# Patient Record
Sex: Female | Born: 1945 | Race: White | Hispanic: No | Smoking: Current every day smoker
Health system: Southern US, Community
[De-identification: ages and names within clinical notes are randomized; demographics above are authoritative.]

## PROBLEM LIST (undated history)

## (undated) DIAGNOSIS — E785 Hyperlipidemia, unspecified: Secondary | ICD-10-CM

## (undated) DIAGNOSIS — I350 Nonrheumatic aortic (valve) stenosis: Secondary | ICD-10-CM

## (undated) DIAGNOSIS — I2510 Atherosclerotic heart disease of native coronary artery without angina pectoris: Secondary | ICD-10-CM

## (undated) DIAGNOSIS — E875 Hyperkalemia: Secondary | ICD-10-CM

## (undated) DIAGNOSIS — I25810 Atherosclerosis of coronary artery bypass graft(s) without angina pectoris: Secondary | ICD-10-CM

## (undated) DIAGNOSIS — Z9981 Dependence on supplemental oxygen: Secondary | ICD-10-CM

## (undated) DIAGNOSIS — K7200 Acute and subacute hepatic failure without coma: Secondary | ICD-10-CM

## (undated) DIAGNOSIS — J189 Pneumonia, unspecified organism: Secondary | ICD-10-CM

## (undated) DIAGNOSIS — F419 Anxiety disorder, unspecified: Secondary | ICD-10-CM

## (undated) DIAGNOSIS — I472 Ventricular tachycardia: Secondary | ICD-10-CM

## (undated) DIAGNOSIS — F32A Depression, unspecified: Secondary | ICD-10-CM

## (undated) DIAGNOSIS — J449 Chronic obstructive pulmonary disease, unspecified: Secondary | ICD-10-CM

## (undated) DIAGNOSIS — I10 Essential (primary) hypertension: Secondary | ICD-10-CM

## (undated) DIAGNOSIS — I272 Pulmonary hypertension, unspecified: Secondary | ICD-10-CM

## (undated) DIAGNOSIS — F329 Major depressive disorder, single episode, unspecified: Secondary | ICD-10-CM

## (undated) DIAGNOSIS — K219 Gastro-esophageal reflux disease without esophagitis: Secondary | ICD-10-CM

## (undated) DIAGNOSIS — C50412 Malignant neoplasm of upper-outer quadrant of left female breast: Secondary | ICD-10-CM

## (undated) DIAGNOSIS — S32010A Wedge compression fracture of first lumbar vertebra, initial encounter for closed fracture: Secondary | ICD-10-CM

## (undated) DIAGNOSIS — I509 Heart failure, unspecified: Secondary | ICD-10-CM

## (undated) DIAGNOSIS — J42 Unspecified chronic bronchitis: Secondary | ICD-10-CM

## (undated) DIAGNOSIS — F411 Generalized anxiety disorder: Secondary | ICD-10-CM

## (undated) DIAGNOSIS — J9610 Chronic respiratory failure, unspecified whether with hypoxia or hypercapnia: Secondary | ICD-10-CM

## (undated) DIAGNOSIS — R4182 Altered mental status, unspecified: Secondary | ICD-10-CM

## (undated) DIAGNOSIS — Z8719 Personal history of other diseases of the digestive system: Secondary | ICD-10-CM

## (undated) DIAGNOSIS — R945 Abnormal results of liver function studies: Secondary | ICD-10-CM

## (undated) DIAGNOSIS — C50911 Malignant neoplasm of unspecified site of right female breast: Secondary | ICD-10-CM

## (undated) DIAGNOSIS — I5030 Unspecified diastolic (congestive) heart failure: Secondary | ICD-10-CM

## (undated) DIAGNOSIS — R011 Cardiac murmur, unspecified: Secondary | ICD-10-CM

## (undated) DIAGNOSIS — E871 Hypo-osmolality and hyponatremia: Secondary | ICD-10-CM

## (undated) DIAGNOSIS — M1990 Unspecified osteoarthritis, unspecified site: Secondary | ICD-10-CM

## (undated) DIAGNOSIS — Z8489 Family history of other specified conditions: Secondary | ICD-10-CM

## (undated) DIAGNOSIS — I214 Non-ST elevation (NSTEMI) myocardial infarction: Secondary | ICD-10-CM

## (undated) DIAGNOSIS — Z9289 Personal history of other medical treatment: Secondary | ICD-10-CM

## (undated) DIAGNOSIS — J9600 Acute respiratory failure, unspecified whether with hypoxia or hypercapnia: Secondary | ICD-10-CM

## (undated) DIAGNOSIS — I209 Angina pectoris, unspecified: Secondary | ICD-10-CM

## (undated) DIAGNOSIS — I4729 Other ventricular tachycardia: Secondary | ICD-10-CM

## (undated) DIAGNOSIS — N179 Acute kidney failure, unspecified: Secondary | ICD-10-CM

## (undated) HISTORY — PX: CATARACT EXTRACTION W/ INTRAOCULAR LENS  IMPLANT, BILATERAL: SHX1307

## (undated) HISTORY — DX: Other ventricular tachycardia: I47.29

## (undated) HISTORY — DX: Abnormal results of liver function studies: R94.5

## (undated) HISTORY — DX: Malignant neoplasm of upper-outer quadrant of left female breast: C50.412

## (undated) HISTORY — DX: Wedge compression fracture of first lumbar vertebra, initial encounter for closed fracture: S32.010A

## (undated) HISTORY — PX: COLON SURGERY: SHX602

## (undated) HISTORY — DX: Personal history of other medical treatment: Z92.89

## (undated) HISTORY — DX: Atherosclerosis of coronary artery bypass graft(s) without angina pectoris: I25.810

## (undated) HISTORY — DX: Acute respiratory failure, unspecified whether with hypoxia or hypercapnia: J96.00

## (undated) HISTORY — DX: Hyperlipidemia, unspecified: E78.5

## (undated) HISTORY — DX: Altered mental status, unspecified: R41.82

## (undated) HISTORY — DX: Acute kidney failure, unspecified: N17.9

## (undated) HISTORY — DX: Personal history of other diseases of the digestive system: Z87.19

## (undated) HISTORY — DX: Atherosclerotic heart disease of native coronary artery without angina pectoris: I25.10

## (undated) HISTORY — DX: Acute and subacute hepatic failure without coma: K72.00

## (undated) HISTORY — DX: Nonrheumatic aortic (valve) stenosis: I35.0

## (undated) HISTORY — DX: Generalized anxiety disorder: F41.1

## (undated) HISTORY — DX: Hypo-osmolality and hyponatremia: E87.1

## (undated) HISTORY — DX: Essential (primary) hypertension: I10

## (undated) HISTORY — DX: Hyperkalemia: E87.5

## (undated) HISTORY — DX: Ventricular tachycardia: I47.2

## (undated) HISTORY — DX: Chronic respiratory failure, unspecified whether with hypoxia or hypercapnia: J96.10

---

## 2004-09-27 DIAGNOSIS — I214 Non-ST elevation (NSTEMI) myocardial infarction: Secondary | ICD-10-CM

## 2004-09-27 DIAGNOSIS — I2510 Atherosclerotic heart disease of native coronary artery without angina pectoris: Secondary | ICD-10-CM | POA: Diagnosis present

## 2004-09-27 HISTORY — DX: Non-ST elevation (NSTEMI) myocardial infarction: I21.4

## 2004-09-27 HISTORY — DX: Atherosclerotic heart disease of native coronary artery without angina pectoris: I25.10

## 2004-09-27 HISTORY — PX: CORONARY ANGIOPLASTY WITH STENT PLACEMENT: SHX49

## 2006-09-27 HISTORY — PX: BREAST BIOPSY: SHX20

## 2006-09-27 HISTORY — PX: BREAST LUMPECTOMY: SHX2

## 2007-09-28 HISTORY — PX: PARTIAL COLECTOMY: SHX5273

## 2007-11-26 HISTORY — PX: COLOSTOMY: SHX63

## 2008-05-15 LAB — HM COLONOSCOPY: HM Colonoscopy: NORMAL

## 2008-07-28 HISTORY — PX: COLOSTOMY CLOSURE: SHX1381

## 2009-09-27 HISTORY — PX: CHOLECYSTECTOMY OPEN: SUR202

## 2011-06-02 ENCOUNTER — Emergency Department (HOSPITAL_COMMUNITY): Payer: Medicare Other

## 2011-06-02 ENCOUNTER — Inpatient Hospital Stay (HOSPITAL_COMMUNITY)
Admission: EM | Admit: 2011-06-02 | Discharge: 2011-06-06 | DRG: 193 | Disposition: A | Discharge status: Home / Self Care | Payer: Medicare Other | Attending: Internal Medicine | Admitting: Internal Medicine

## 2011-06-02 DIAGNOSIS — I959 Hypotension, unspecified: Secondary | ICD-10-CM | POA: Diagnosis not present

## 2011-06-02 DIAGNOSIS — J4489 Other specified chronic obstructive pulmonary disease: Secondary | ICD-10-CM

## 2011-06-02 DIAGNOSIS — E785 Hyperlipidemia, unspecified: Secondary | ICD-10-CM | POA: Diagnosis present

## 2011-06-02 DIAGNOSIS — J9600 Acute respiratory failure, unspecified whether with hypoxia or hypercapnia: Secondary | ICD-10-CM

## 2011-06-02 DIAGNOSIS — G43909 Migraine, unspecified, not intractable, without status migrainosus: Secondary | ICD-10-CM | POA: Diagnosis present

## 2011-06-02 DIAGNOSIS — J158 Pneumonia due to other specified bacteria: Secondary | ICD-10-CM

## 2011-06-02 DIAGNOSIS — F17200 Nicotine dependence, unspecified, uncomplicated: Secondary | ICD-10-CM | POA: Diagnosis present

## 2011-06-02 DIAGNOSIS — I10 Essential (primary) hypertension: Secondary | ICD-10-CM | POA: Diagnosis present

## 2011-06-02 DIAGNOSIS — Z79899 Other long term (current) drug therapy: Secondary | ICD-10-CM

## 2011-06-02 DIAGNOSIS — Z7982 Long term (current) use of aspirin: Secondary | ICD-10-CM

## 2011-06-02 DIAGNOSIS — J449 Chronic obstructive pulmonary disease, unspecified: Secondary | ICD-10-CM

## 2011-06-02 DIAGNOSIS — J189 Pneumonia, unspecified organism: Principal | ICD-10-CM | POA: Diagnosis present

## 2011-06-02 DIAGNOSIS — C50919 Malignant neoplasm of unspecified site of unspecified female breast: Secondary | ICD-10-CM | POA: Diagnosis present

## 2011-06-02 DIAGNOSIS — J45901 Unspecified asthma with (acute) exacerbation: Secondary | ICD-10-CM | POA: Diagnosis present

## 2011-06-02 DIAGNOSIS — I359 Nonrheumatic aortic valve disorder, unspecified: Secondary | ICD-10-CM

## 2011-06-02 DIAGNOSIS — J441 Chronic obstructive pulmonary disease with (acute) exacerbation: Secondary | ICD-10-CM | POA: Diagnosis present

## 2011-06-02 LAB — BLOOD GAS, ARTERIAL
Acid-Base Excess: 4 mmol/L — ABNORMAL HIGH (ref 0.0–2.0)
Acid-Base Excess: 1.1 mmol/L (ref 0.0–2.0)
Bicarbonate: 31.7 mEq/L — ABNORMAL HIGH (ref 20.0–24.0)
Bicarbonate: 29 mEq/L — ABNORMAL HIGH (ref 20.0–24.0)
Drawn by: 336861
Drawn by: 103701
Expiratory PAP: 4
FIO2: 1 %
FIO2: 0.5 %
Inspiratory PAP: 8
Mode: POSITIVE
O2 Saturation: 96.6 %
O2 Saturation: 94.9 %
Patient temperature: 104
Patient temperature: 98.6
TCO2: 29 mmol/L (ref 0–100)
TCO2: 26.8 mmol/L (ref 0–100)
pCO2 arterial: 75.7 mmHg (ref 35.0–45.0)
pCO2 arterial: 64.9 mmHg (ref 35.0–45.0)
pH, Arterial: 7.265 — ABNORMAL LOW (ref 7.350–7.400)
pH, Arterial: 7.273 — ABNORMAL LOW (ref 7.350–7.400)
pO2, Arterial: 112 mmHg — ABNORMAL HIGH (ref 80.0–100.0)
pO2, Arterial: 83.9 mmHg (ref 80.0–100.0)

## 2011-06-02 LAB — EXPECTORATED SPUTUM ASSESSMENT W GRAM STAIN, RFLX TO RESP C

## 2011-06-02 LAB — CBC
HCT: 41.4 % (ref 36.0–46.0)
HCT: 41 % (ref 36.0–46.0)
Hemoglobin: 13.3 g/dL (ref 12.0–15.0)
Hemoglobin: 13 g/dL (ref 12.0–15.0)
MCH: 30.7 pg (ref 26.0–34.0)
MCH: 30.7 pg (ref 26.0–34.0)
MCHC: 32.1 g/dL (ref 30.0–36.0)
MCHC: 31.7 g/dL (ref 30.0–36.0)
MCV: 95.6 fL (ref 78.0–100.0)
MCV: 96.7 fL (ref 78.0–100.0)
Platelets: 346 10*3/uL (ref 150–400)
Platelets: 304 10*3/uL (ref 150–400)
RBC: 4.33 MIL/uL (ref 3.87–5.11)
RBC: 4.24 MIL/uL (ref 3.87–5.11)
RDW: 13.5 % (ref 11.5–15.5)
RDW: 13.7 % (ref 11.5–15.5)
WBC: 18.3 10*3/uL — ABNORMAL HIGH (ref 4.0–10.5)
WBC: 21.9 10*3/uL — ABNORMAL HIGH (ref 4.0–10.5)

## 2011-06-02 LAB — BASIC METABOLIC PANEL
BUN: 17 mg/dL (ref 6–23)
BUN: 17 mg/dL (ref 6–23)
CO2: 27 mEq/L (ref 19–32)
CO2: 29 mEq/L (ref 19–32)
Calcium: 9.9 mg/dL (ref 8.4–10.5)
Calcium: 8.3 mg/dL — ABNORMAL LOW (ref 8.4–10.5)
Chloride: 99 mEq/L (ref 96–112)
Chloride: 102 mEq/L (ref 96–112)
Creatinine, Ser: 0.91 mg/dL (ref 0.50–1.10)
Creatinine, Ser: 0.82 mg/dL (ref 0.50–1.10)
GFR calc Af Amer: >60 mL/min (ref >60)
GFR calc Af Amer: >60 mL/min (ref >60)
GFR calc non Af Amer: >60 mL/min (ref >60)
GFR calc non Af Amer: >60 mL/min (ref >60)
Glucose, Bld: 103 mg/dL — ABNORMAL HIGH (ref 70–99)
Glucose, Bld: 131 mg/dL — ABNORMAL HIGH (ref 70–99)
Potassium: 5.4 mEq/L — ABNORMAL HIGH (ref 3.5–5.1)
Potassium: 4.9 mEq/L (ref 3.5–5.1)
Sodium: 136 mEq/L (ref 135–145)
Sodium: 136 mEq/L (ref 135–145)

## 2011-06-02 LAB — CARDIAC PANEL(CRET KIN+CKTOT+MB+TROPI)
CK, MB: 3.4 ng/mL (ref 0.3–4.0)
CK, MB: 3.3 ng/mL (ref 0.3–4.0)
Relative Index: INVALID (ref 0.0–2.5)
Relative Index: INVALID (ref 0.0–2.5)
Total CK: 74 U/L (ref 7–177)
Total CK: 51 U/L (ref 7–177)
Troponin I: <0.3 ng/mL (ref <0.30)
Troponin I: <0.3 ng/mL (ref <0.30)

## 2011-06-02 LAB — DIFFERENTIAL
Basophils Absolute: 0.1 10*3/uL (ref 0.0–0.1)
Basophils Relative: 0 % (ref 0–1)
Eosinophils Absolute: 0 10*3/uL (ref 0.0–0.7)
Eosinophils Relative: 0 % (ref 0–5)
Lymphocytes Relative: 4 % — ABNORMAL LOW (ref 12–46)
Lymphs Abs: 0.8 10*3/uL (ref 0.7–4.0)
Monocytes Absolute: 1.1 10*3/uL — ABNORMAL HIGH (ref 0.1–1.0)
Monocytes Relative: 6 % (ref 3–12)
Neutro Abs: 16.4 10*3/uL — ABNORMAL HIGH (ref 1.7–7.7)
Neutrophils Relative %: 89 % — ABNORMAL HIGH (ref 43–77)

## 2011-06-02 LAB — POCT I-STAT TROPONIN I: Troponin i, poc: 0.02 ng/mL (ref 0.00–0.08)

## 2011-06-02 LAB — PROLACTIN: Prolactin: 4.6 ng/mL

## 2011-06-02 LAB — LACTIC ACID, PLASMA: Lactic Acid, Venous: 0.7 mmol/L (ref 0.5–2.2)

## 2011-06-02 LAB — MRSA PCR SCREENING: MRSA by PCR: NEGATIVE

## 2011-06-02 LAB — STREP PNEUMONIAE URINARY ANTIGEN: Strep Pneumo Urinary Antigen: NEGATIVE

## 2011-06-03 ENCOUNTER — Inpatient Hospital Stay (HOSPITAL_COMMUNITY): Payer: Medicare Other

## 2011-06-03 DIAGNOSIS — J9600 Acute respiratory failure, unspecified whether with hypoxia or hypercapnia: Secondary | ICD-10-CM

## 2011-06-03 DIAGNOSIS — J449 Chronic obstructive pulmonary disease, unspecified: Secondary | ICD-10-CM

## 2011-06-03 DIAGNOSIS — J158 Pneumonia due to other specified bacteria: Secondary | ICD-10-CM

## 2011-06-03 LAB — LEGIONELLA ANTIGEN, URINE: Legionella Antigen, Urine: NEGATIVE

## 2011-06-03 LAB — CARDIAC PANEL(CRET KIN+CKTOT+MB+TROPI)
CK, MB: 3.4 ng/mL (ref 0.3–4.0)
Relative Index: INVALID (ref 0.0–2.5)
Total CK: 50 U/L (ref 7–177)
Troponin I: <0.3 ng/mL (ref <0.30)

## 2011-06-04 ENCOUNTER — Inpatient Hospital Stay (HOSPITAL_COMMUNITY): Payer: Medicare Other

## 2011-06-04 LAB — CBC
HCT: 38.6 % (ref 36.0–46.0)
Hemoglobin: 12 g/dL (ref 12.0–15.0)
MCH: 30.4 pg (ref 26.0–34.0)
MCHC: 31.1 g/dL (ref 30.0–36.0)
MCV: 97.7 fL (ref 78.0–100.0)
Platelets: 260 10*3/uL (ref 150–400)
RBC: 3.95 MIL/uL (ref 3.87–5.11)
RDW: 13.6 % (ref 11.5–15.5)
WBC: 9.7 10*3/uL (ref 4.0–10.5)

## 2011-06-04 LAB — BASIC METABOLIC PANEL
BUN: 20 mg/dL (ref 6–23)
BUN: 18 mg/dL (ref 6–23)
CO2: 31 mEq/L (ref 19–32)
CO2: 32 mEq/L (ref 19–32)
Calcium: 9.3 mg/dL (ref 8.4–10.5)
Calcium: 9.4 mg/dL (ref 8.4–10.5)
Chloride: 102 mEq/L (ref 96–112)
Chloride: 99 mEq/L (ref 96–112)
Creatinine, Ser: 0.8 mg/dL (ref 0.50–1.10)
Creatinine, Ser: 0.75 mg/dL (ref 0.50–1.10)
GFR calc Af Amer: >60 mL/min (ref >60)
GFR calc Af Amer: >60 mL/min (ref >60)
GFR calc non Af Amer: >60 mL/min (ref >60)
GFR calc non Af Amer: >60 mL/min (ref >60)
Glucose, Bld: 118 mg/dL — ABNORMAL HIGH (ref 70–99)
Glucose, Bld: 107 mg/dL — ABNORMAL HIGH (ref 70–99)
Potassium: 5.5 mEq/L — ABNORMAL HIGH (ref 3.5–5.1)
Potassium: 5.4 mEq/L — ABNORMAL HIGH (ref 3.5–5.1)
Sodium: 137 mEq/L (ref 135–145)
Sodium: 137 mEq/L (ref 135–145)

## 2011-06-05 LAB — CULTURE, RESPIRATORY W GRAM STAIN: Culture: NORMAL

## 2011-06-06 LAB — BASIC METABOLIC PANEL
BUN: 12 mg/dL (ref 6–23)
CO2: 36 mEq/L — ABNORMAL HIGH (ref 19–32)
Calcium: 9.7 mg/dL (ref 8.4–10.5)
Chloride: 99 mEq/L (ref 96–112)
Creatinine, Ser: 0.59 mg/dL (ref 0.50–1.10)
GFR calc Af Amer: >60 mL/min (ref >60)
GFR calc non Af Amer: >60 mL/min (ref >60)
Glucose, Bld: 95 mg/dL (ref 70–99)
Potassium: 4.4 mEq/L (ref 3.5–5.1)
Sodium: 139 mEq/L (ref 135–145)

## 2011-06-08 LAB — CULTURE, BLOOD (ROUTINE X 2)
Culture  Setup Time: 201209050956
Culture  Setup Time: 201209050956
Culture: NO GROWTH
Culture: NO GROWTH

## 2011-06-11 ENCOUNTER — Other Ambulatory Visit: Payer: Self-pay | Admitting: Internal Medicine

## 2011-06-11 DIAGNOSIS — Z9889 Other specified postprocedural states: Secondary | ICD-10-CM

## 2011-06-11 DIAGNOSIS — Z853 Personal history of malignant neoplasm of breast: Secondary | ICD-10-CM

## 2011-06-18 ENCOUNTER — Ambulatory Visit
Admission: RE | Admit: 2011-06-18 | Discharge: 2011-06-18 | Disposition: A | Discharge status: Home / Self Care | Payer: Medicare Other | Source: Ambulatory Visit | Attending: Internal Medicine | Admitting: Internal Medicine

## 2011-06-18 DIAGNOSIS — Z853 Personal history of malignant neoplasm of breast: Secondary | ICD-10-CM

## 2011-06-22 NOTE — H&P (Signed)
[name], [name]               ACCOUNT NO.:  [id number]  MEDICAL RECORD NO.:  [id number]  LOCATION:  1230                         FACILITY:  [facility]  PHYSICIAN:  Manson Passey, MD        DATE OF BIRTH:  [birth year]  DATE OF ADMISSION:  06/02/2011 DATE OF DISCHARGE:                             HISTORY & PHYSICAL   CHIEF COMPLAINT:  Acute respiratory distress.  HISTORY OF PRESENT ILLNESS:  The patient is a 65 year old female with history of COPD, emphysema, and asthma with baseline PEF as per patient 350, active smoker, breast cancer in remission, migraine headaches, admitted for acute respiratory distress.  Subjective fever, cough productive of whitish sputum for 2-3 days prior to admission.  The patient reports no complaints of chest pain, no night sweats, and no weight loss.  No complaints of abdominal pain, nausea or vomiting, no diarrhea, no weight loss.  REVIEW OF SYSTEMS:  As per HPI.  PAST MEDICAL HISTORY: 1. COPD. 2. Emphysema. 3. Asthma. 4. Active smoker. 5. Breast cancer, in remission. 6. Migraine headaches.  SOCIAL HISTORY:  The patient admits to smoking half pack per day.  No illegal drug use.  No alcohol abuse.  FAMILY HISTORY:  No significant contributing family history.  PHYSICAL EXAM:  VITAL SIGNS:  Blood pressure 148/63, pulse 81, respirations 18, temperature 97 Fahrenheit, oxygen saturation 95% on 4 liters nasal cannula. GENERAL APPEARANCE:  No acute distress, comfortable. LUNGS:  Minimal wheezing over upper lung lobes.  No rhonchi. CARDIOVASCULAR:  Positive S1and S2 sounds, regular rate and rhythm. ABDOMINAL EXAM:  Positive bowel sounds, nontender, nondistended abdomen. EXTREMITIES:  Pulses are palpable bilaterally.  No lower extremity edema. NEUROLOGICAL EXAM:  The patient is alert, awake, oriented x3, no focal neurologic deficits.  LABORATORY VALUES:  Sodium 137, potassium 5.5, chloride 102, bicarb 31, BUN 20, creatinine 0.8, glucose 118, calcium  9.3.  White blood cells 9.7, hemoglobin 12, platelets 260.  Urinalysis negative for Legionella. Chest x-ray, no change in diffuse pulmonary infiltrates.  MEDICATIONS: 1. Crestor 40 mg at bedtime daily. 2. Aspirin 325 mg daily. 3. Arimidex 1 mg daily. 4. Pamelor 25 mg at bedtime. 5. Protonix 40 mEq daily. 6. Albuterol inhaler 2 puffs every 4-6 hours as needed. 7. Atrovent 2 puffs every 4-6 hours as needed. 8. Singulair 10 mg daily. 9. Xanax as needed.  ASSESSMENT AND PLAN AND FINAL IMPRESSION:  This is a 65 year old female with history of chronic obstructive pulmonary disease, emphysema and asthma with baseline peak flow of 350, active smoker, breast cancer in remission, migraine headaches, admitted for acute respiratory distress.  IMPRESSION: 1. Acute respiratory distress, likely due to chronic obstructive     pulmonary disease exacerbation versus community-acquired pneumonia.     The patient was initially started on Solu-Medrol 125 mg IV, which     now is changed to prednisone by mouth daily, taper from 60 mg to 0     mg.  Levaquin was started at 750 mg IV every 24 hours, nebulizer     treatment, albuterol and Atrovent every 4 hours as scheduled.     Pulse oximetry and oxygen at 4 liters nasal cannula. 2. History of breast cancer.  We will continue Arimidex 1 mg daily. 3. Migraine headaches.  Continue Pamelor every day. 4. Dyslipidemia, Crestor at bedtime. 5. Hypertension, well controlled on current regimen.  Enalapril 5 mg     daily. 6. Deep vein thrombosis prophylaxis, heparin subcu 5000 units every 8     hours.  Gastrointestinal prophylaxis, Protonix 40 mg daily. 7. Nutrition, regular diet. 8. Directives, full code.  More than 35 minutes has been spent seeing and examining the patient.          ______________________________ Manson Passey, MD     AD/MEDQ  D:  06/04/2011  T:  06/04/2011  Job:  [id number]  Electronically Signed by Manson Passey MD on 06/22/2011 05:20:23  PM

## 2011-06-22 NOTE — Discharge Summary (Signed)
  [name], [name]               ACCOUNT NO.:  [id number]  MEDICAL RECORD NO.:  [id number]  LOCATION:  1312                         FACILITY:  [facility]  PHYSICIAN:  Manson Passey, MD        DATE OF BIRTH:  01-13-1946  DATE OF ADMISSION:  06/02/2011 DATE OF DISCHARGE:  06/06/2011                              DISCHARGE SUMMARY   PRIMARY CARE PHYSICIAN:  Not available information.  DISCHARGE DIAGNOSES:  Chronic obstructive pulmonary disease exacerbation, community-acquired pneumonia.  DISCHARGE MEDICATIONS:  Prednisone taper starting 50 mg, taper by 5 mg to 0 mg.  DISPOSITION AND FOLLOWUP:  The patient is clinically stable to be discharged home.  Followup is recommended with the pulmonary care physician.  DIAGNOSTIC TESTS:  June 04, 2011, chest x-ray showed no change in diffuse pulmonary infiltrates.  CONSULTS:  Pulmonary Critical Care.  HISTORY OF PRESENT ILLNESS:  The patient is a 65 year old female with history of COPD, emphysema, asthma, baseline PEF is per patient 350, active smoker, breast cancer in remission, migraine headaches, admitted for acute respiratory distress.  The patient was complaining of subjective fever, cough productive of whitish sputum for 2-3 days prior to admission.  No reports of chest pain, no night sweats, no weight loss.  PHYSICAL EXAMINATION ON DISCHARGE:  VITAL SIGNS:  Blood pressure 110/64, pulse is 89, respiration 18, temperature 98.9 Fahrenheit, oxygen saturation 96% on 2 L nasal cannula. GENERAL APPEARANCE:  No acute distress. LUNGS:  Bilateral air entry, minimal wheezing over upper lung lobes. CARDIOVASCULAR:  Audible S1 and S2 sounds, regular rate and rhythm. ABDOMEN:  Positive bowel sounds, nontender, nondistended. NEUROLOGIC:  Alert, awake, oriented x3; no focal neurological deficits. EXTREMITIES:  Pulses palpable bilaterally; no lower extremity edema.  LABORATORY DATA:  From June 06, 2011, sodium 139, potassium 4.4, chloride  99, bicarbonate 36, BUN 12, creatinine 0.59, glucose 95. Respiratory culture from June 02, 2011, showing no organisms today.  HOSPITAL COURSE BY PROBLEM: 1. COPD exacerbation.  The patient is stable to be discharged home.     We will continue nebulizer treatments, albuterol, and Atrovent as     needed for shortness of breath or wheezing.  It is important that     the patient follows up with the pulmonary care doctor and perhaps     be evaluated with pulmonary function test. 2. Community-acquired pneumonia.  The patient will be sent on     Augmentin 875 mg twice a day for 7 days as the patient reports not     tolerating Levaquin or Zithromax. 3. Smoking cessation has been discussed with the patient extensively,     strongly encouraged to quit smoking. 4. The patient is safe to be discharged home, clinically stable.  More than 30 minutes have been spent discharging the patient.          ______________________________ Manson Passey, MD     AD/MEDQ  D:  06/06/2011  T:  06/06/2011  Job:  [id number]  Electronically Signed by Manson Passey MD on 06/22/2011 05:20:18 PM

## 2011-07-28 ENCOUNTER — Other Ambulatory Visit: Payer: Self-pay | Admitting: Oncology

## 2011-07-28 ENCOUNTER — Encounter (HOSPITAL_BASED_OUTPATIENT_CLINIC_OR_DEPARTMENT_OTHER): Payer: Medicare Other | Admitting: Oncology

## 2011-07-28 DIAGNOSIS — F17200 Nicotine dependence, unspecified, uncomplicated: Secondary | ICD-10-CM

## 2011-07-28 DIAGNOSIS — C50919 Malignant neoplasm of unspecified site of unspecified female breast: Secondary | ICD-10-CM

## 2011-07-28 LAB — COMPREHENSIVE METABOLIC PANEL
ALT: 29 U/L (ref 0–35)
AST: 22 U/L (ref 0–37)
Albumin: 3.2 g/dL — ABNORMAL LOW (ref 3.5–5.2)
Alkaline Phosphatase: 171 U/L — ABNORMAL HIGH (ref 39–117)
BUN: 11 mg/dL (ref 6–23)
CO2: 32 mEq/L (ref 19–32)
Calcium: 9.6 mg/dL (ref 8.4–10.5)
Chloride: 98 mEq/L (ref 96–112)
Creatinine, Ser: 0.8 mg/dL (ref 0.50–1.10)
Glucose, Bld: 87 mg/dL (ref 70–99)
Potassium: 4.1 mEq/L (ref 3.5–5.3)
Sodium: 137 mEq/L (ref 135–145)
Total Bilirubin: 0.1 mg/dL — ABNORMAL LOW (ref 0.3–1.2)
Total Protein: 7.5 g/dL (ref 6.0–8.3)

## 2011-07-28 LAB — CBC WITH DIFFERENTIAL/PLATELET
BASO%: 0.5 % (ref 0.0–2.0)
Basophils Absolute: 0 10*3/uL (ref 0.0–0.1)
EOS%: 0.3 % (ref 0.0–7.0)
Eosinophils Absolute: 0 10*3/uL (ref 0.0–0.5)
HCT: 42 % (ref 34.8–46.6)
HGB: 13 g/dL (ref 11.6–15.9)
LYMPH%: 18.6 % (ref 14.0–49.7)
MCH: 25.9 pg (ref 25.1–34.0)
MCHC: 31 g/dL — ABNORMAL LOW (ref 31.5–36.0)
MCV: 83.3 fL (ref 79.5–101.0)
MONO#: 0.4 10*3/uL (ref 0.1–0.9)
MONO%: 7.1 % (ref 0.0–14.0)
NEUT#: 4.6 10*3/uL (ref 1.5–6.5)
NEUT%: 73.5 % (ref 38.4–76.8)
Platelets: 234 10*3/uL (ref 145–400)
RBC: 5.04 10*6/uL (ref 3.70–5.45)
RDW: 19.1 % — ABNORMAL HIGH (ref 11.2–14.5)
WBC: 6.3 10*3/uL (ref 3.9–10.3)
lymph#: 1.2 10*3/uL (ref 0.9–3.3)

## 2011-07-28 LAB — CANCER ANTIGEN 27.29: CA 27.29: 24 U/mL (ref 0–39)

## 2011-07-31 ENCOUNTER — Emergency Department (HOSPITAL_COMMUNITY): Payer: Medicare Other

## 2011-07-31 ENCOUNTER — Inpatient Hospital Stay (HOSPITAL_COMMUNITY)
Admission: EM | Admit: 2011-07-31 | Discharge: 2011-08-04 | DRG: 280 | Disposition: A | Discharge status: Home Health | Payer: Medicare Other | Attending: Internal Medicine | Admitting: Internal Medicine

## 2011-07-31 DIAGNOSIS — F411 Generalized anxiety disorder: Secondary | ICD-10-CM | POA: Diagnosis present

## 2011-07-31 DIAGNOSIS — I252 Old myocardial infarction: Secondary | ICD-10-CM

## 2011-07-31 DIAGNOSIS — Z79899 Other long term (current) drug therapy: Secondary | ICD-10-CM

## 2011-07-31 DIAGNOSIS — R188 Other ascites: Secondary | ICD-10-CM | POA: Diagnosis present

## 2011-07-31 DIAGNOSIS — Z9861 Coronary angioplasty status: Secondary | ICD-10-CM

## 2011-07-31 DIAGNOSIS — R7401 Elevation of levels of liver transaminase levels: Secondary | ICD-10-CM | POA: Diagnosis present

## 2011-07-31 DIAGNOSIS — K5900 Constipation, unspecified: Secondary | ICD-10-CM | POA: Diagnosis not present

## 2011-07-31 DIAGNOSIS — Y92009 Unspecified place in unspecified non-institutional (private) residence as the place of occurrence of the external cause: Secondary | ICD-10-CM

## 2011-07-31 DIAGNOSIS — R7402 Elevation of levels of lactic acid dehydrogenase (LDH): Secondary | ICD-10-CM | POA: Diagnosis present

## 2011-07-31 DIAGNOSIS — I472 Ventricular tachycardia, unspecified: Secondary | ICD-10-CM | POA: Diagnosis not present

## 2011-07-31 DIAGNOSIS — I5031 Acute diastolic (congestive) heart failure: Principal | ICD-10-CM | POA: Diagnosis present

## 2011-07-31 DIAGNOSIS — I214 Non-ST elevation (NSTEMI) myocardial infarction: Secondary | ICD-10-CM

## 2011-07-31 DIAGNOSIS — J449 Chronic obstructive pulmonary disease, unspecified: Secondary | ICD-10-CM | POA: Diagnosis present

## 2011-07-31 DIAGNOSIS — I350 Nonrheumatic aortic (valve) stenosis: Secondary | ICD-10-CM | POA: Diagnosis present

## 2011-07-31 DIAGNOSIS — R4182 Altered mental status, unspecified: Secondary | ICD-10-CM | POA: Diagnosis present

## 2011-07-31 DIAGNOSIS — T424X4A Poisoning by benzodiazepines, undetermined, initial encounter: Secondary | ICD-10-CM | POA: Diagnosis present

## 2011-07-31 DIAGNOSIS — I2510 Atherosclerotic heart disease of native coronary artery without angina pectoris: Secondary | ICD-10-CM | POA: Diagnosis present

## 2011-07-31 DIAGNOSIS — E785 Hyperlipidemia, unspecified: Secondary | ICD-10-CM | POA: Diagnosis present

## 2011-07-31 DIAGNOSIS — F17200 Nicotine dependence, unspecified, uncomplicated: Secondary | ICD-10-CM | POA: Diagnosis present

## 2011-07-31 DIAGNOSIS — Z9049 Acquired absence of other specified parts of digestive tract: Secondary | ICD-10-CM

## 2011-07-31 DIAGNOSIS — N179 Acute kidney failure, unspecified: Secondary | ICD-10-CM | POA: Diagnosis present

## 2011-07-31 DIAGNOSIS — Z8249 Family history of ischemic heart disease and other diseases of the circulatory system: Secondary | ICD-10-CM

## 2011-07-31 DIAGNOSIS — T424X1A Poisoning by benzodiazepines, accidental (unintentional), initial encounter: Secondary | ICD-10-CM | POA: Diagnosis present

## 2011-07-31 DIAGNOSIS — G43909 Migraine, unspecified, not intractable, without status migrainosus: Secondary | ICD-10-CM | POA: Diagnosis present

## 2011-07-31 DIAGNOSIS — F329 Major depressive disorder, single episode, unspecified: Secondary | ICD-10-CM | POA: Diagnosis present

## 2011-07-31 DIAGNOSIS — I509 Heart failure, unspecified: Secondary | ICD-10-CM | POA: Diagnosis present

## 2011-07-31 DIAGNOSIS — F3289 Other specified depressive episodes: Secondary | ICD-10-CM | POA: Diagnosis present

## 2011-07-31 DIAGNOSIS — J4489 Other specified chronic obstructive pulmonary disease: Secondary | ICD-10-CM | POA: Diagnosis present

## 2011-07-31 DIAGNOSIS — Z9089 Acquired absence of other organs: Secondary | ICD-10-CM

## 2011-07-31 DIAGNOSIS — E875 Hyperkalemia: Secondary | ICD-10-CM | POA: Diagnosis present

## 2011-07-31 DIAGNOSIS — I4729 Other ventricular tachycardia: Secondary | ICD-10-CM | POA: Diagnosis not present

## 2011-07-31 DIAGNOSIS — R945 Abnormal results of liver function studies: Secondary | ICD-10-CM

## 2011-07-31 DIAGNOSIS — Z853 Personal history of malignant neoplasm of breast: Secondary | ICD-10-CM

## 2011-07-31 DIAGNOSIS — J9620 Acute and chronic respiratory failure, unspecified whether with hypoxia or hypercapnia: Secondary | ICD-10-CM | POA: Diagnosis present

## 2011-07-31 HISTORY — DX: Cardiac murmur, unspecified: R01.1

## 2011-07-31 HISTORY — DX: Anxiety disorder, unspecified: F41.9

## 2011-07-31 HISTORY — DX: Depression, unspecified: F32.A

## 2011-07-31 HISTORY — DX: Chronic obstructive pulmonary disease, unspecified: J44.9

## 2011-07-31 HISTORY — DX: Essential (primary) hypertension: I10

## 2011-07-31 HISTORY — DX: Heart failure, unspecified: I50.9

## 2011-07-31 HISTORY — DX: Angina pectoris, unspecified: I20.9

## 2011-07-31 HISTORY — DX: Major depressive disorder, single episode, unspecified: F32.9

## 2011-07-31 HISTORY — DX: Atherosclerotic heart disease of native coronary artery without angina pectoris: I25.10

## 2011-07-31 LAB — URINE MICROSCOPIC-ADD ON

## 2011-07-31 LAB — COMPREHENSIVE METABOLIC PANEL
ALT: 115 U/L — ABNORMAL HIGH (ref 0–35)
AST: 119 U/L — ABNORMAL HIGH (ref 0–37)
Albumin: 3 g/dL — ABNORMAL LOW (ref 3.5–5.2)
Alkaline Phosphatase: 190 U/L — ABNORMAL HIGH (ref 39–117)
BUN: 24 mg/dL — ABNORMAL HIGH (ref 6–23)
CO2: 32 mEq/L (ref 19–32)
Calcium: 9.3 mg/dL (ref 8.4–10.5)
Chloride: 98 mEq/L (ref 96–112)
Creatinine, Ser: 1.62 mg/dL — ABNORMAL HIGH (ref 0.50–1.10)
GFR calc Af Amer: 37 mL/min — ABNORMAL LOW (ref >90)
GFR calc non Af Amer: 32 mL/min — ABNORMAL LOW (ref >90)
Glucose, Bld: 97 mg/dL (ref 70–99)
Potassium: 5.6 mEq/L — ABNORMAL HIGH (ref 3.5–5.1)
Sodium: 136 mEq/L (ref 135–145)
Total Bilirubin: 0.2 mg/dL — ABNORMAL LOW (ref 0.3–1.2)
Total Protein: 7.3 g/dL (ref 6.0–8.3)

## 2011-07-31 LAB — BLOOD GAS, ARTERIAL
Acid-Base Excess: 2.8 mmol/L — ABNORMAL HIGH (ref 0.0–2.0)
Bicarbonate: 31.7 mEq/L — ABNORMAL HIGH (ref 20.0–24.0)
Drawn by: 30996
O2 Content: 3 L/min
O2 Saturation: 93.4 %
Patient temperature: 98.6
TCO2: 29.4 mmol/L (ref 0–100)
pCO2 arterial: 73.9 mmHg (ref 35.0–45.0)
pH, Arterial: 7.255 — ABNORMAL LOW (ref 7.350–7.400)
pO2, Arterial: 77.4 mmHg — ABNORMAL LOW (ref 80.0–100.0)

## 2011-07-31 LAB — RAPID URINE DRUG SCREEN, HOSP PERFORMED
Amphetamines: NOT DETECTED
Barbiturates: POSITIVE — AB
Benzodiazepines: POSITIVE — AB
Cocaine: NOT DETECTED
Opiates: NOT DETECTED
Tetrahydrocannabinol: NOT DETECTED

## 2011-07-31 LAB — CBC
HCT: 44.5 % (ref 36.0–46.0)
Hemoglobin: 13.4 g/dL (ref 12.0–15.0)
MCH: 26.1 pg (ref 26.0–34.0)
MCHC: 30.1 g/dL (ref 30.0–36.0)
MCV: 86.7 fL (ref 78.0–100.0)
Platelets: ADEQUATE 10*3/uL (ref 150–400)
RBC: 5.13 MIL/uL — ABNORMAL HIGH (ref 3.87–5.11)
RDW: 17.6 % — ABNORMAL HIGH (ref 11.5–15.5)
WBC: 9.1 10*3/uL (ref 4.0–10.5)

## 2011-07-31 LAB — POCT I-STAT TROPONIN I: Troponin i, poc: 0.43 ng/mL (ref 0.00–0.08)

## 2011-07-31 LAB — DIFFERENTIAL
Basophils Absolute: 0.1 10*3/uL (ref 0.0–0.1)
Basophils Relative: 1 % (ref 0–1)
Eosinophils Absolute: 0 10*3/uL (ref 0.0–0.7)
Eosinophils Relative: 0 % (ref 0–5)
Lymphocytes Relative: 12 % (ref 12–46)
Lymphs Abs: 1.1 10*3/uL (ref 0.7–4.0)
Monocytes Absolute: 0.8 10*3/uL (ref 0.1–1.0)
Monocytes Relative: 9 % (ref 3–12)
Neutro Abs: 7.1 10*3/uL (ref 1.7–7.7)
Neutrophils Relative %: 78 % — ABNORMAL HIGH (ref 43–77)

## 2011-07-31 LAB — URINALYSIS, ROUTINE W REFLEX MICROSCOPIC
Bilirubin Urine: NEGATIVE
Glucose, UA: NEGATIVE mg/dL
Hgb urine dipstick: NEGATIVE
Ketones, ur: 40 mg/dL — AB
Nitrite: NEGATIVE
Protein, ur: 30 mg/dL — AB
Specific Gravity, Urine: 1.025 (ref 1.005–1.030)
Urobilinogen, UA: 0.2 mg/dL (ref 0.0–1.0)
pH: 6 (ref 5.0–8.0)

## 2011-07-31 LAB — PRO B NATRIURETIC PEPTIDE: Pro B Natriuretic peptide (BNP): 20807 pg/mL — ABNORMAL HIGH (ref 0–125)

## 2011-07-31 LAB — AMMONIA: Ammonia: <10 umol/L — ABNORMAL LOW (ref 11–60)

## 2011-07-31 LAB — TROPONIN I: Troponin I: 0.6 ng/mL (ref <0.30)

## 2011-07-31 LAB — ETHANOL: Alcohol, Ethyl (B): <11 mg/dL (ref 0–11)

## 2011-08-01 ENCOUNTER — Other Ambulatory Visit: Payer: Self-pay

## 2011-08-01 ENCOUNTER — Inpatient Hospital Stay (HOSPITAL_COMMUNITY): Payer: Medicare Other

## 2011-08-01 ENCOUNTER — Encounter (HOSPITAL_COMMUNITY): Payer: Self-pay | Admitting: *Deleted

## 2011-08-01 DIAGNOSIS — I2510 Atherosclerotic heart disease of native coronary artery without angina pectoris: Secondary | ICD-10-CM | POA: Diagnosis present

## 2011-08-01 DIAGNOSIS — I509 Heart failure, unspecified: Secondary | ICD-10-CM

## 2011-08-01 DIAGNOSIS — Z9049 Acquired absence of other specified parts of digestive tract: Secondary | ICD-10-CM

## 2011-08-01 DIAGNOSIS — I350 Nonrheumatic aortic (valve) stenosis: Secondary | ICD-10-CM | POA: Diagnosis present

## 2011-08-01 DIAGNOSIS — R4182 Altered mental status, unspecified: Secondary | ICD-10-CM | POA: Diagnosis present

## 2011-08-01 HISTORY — DX: Nonrheumatic aortic (valve) stenosis: I35.0

## 2011-08-01 HISTORY — DX: Heart failure, unspecified: I50.9

## 2011-08-01 HISTORY — DX: Atherosclerotic heart disease of native coronary artery without angina pectoris: I25.10

## 2011-08-01 HISTORY — DX: Altered mental status, unspecified: R41.82

## 2011-08-01 LAB — CBC
HCT: 44.3 % (ref 36.0–46.0)
Hemoglobin: 12.9 g/dL (ref 12.0–15.0)
MCH: 25.4 pg — ABNORMAL LOW (ref 26.0–34.0)
MCHC: 29.1 g/dL — ABNORMAL LOW (ref 30.0–36.0)
MCV: 87.2 fL (ref 78.0–100.0)
Platelets: 242 10*3/uL (ref 150–400)
RBC: 5.08 MIL/uL (ref 3.87–5.11)
RDW: 17.8 % — ABNORMAL HIGH (ref 11.5–15.5)
WBC: 8.2 10*3/uL (ref 4.0–10.5)

## 2011-08-01 LAB — PROTIME-INR
INR: 1.05 (ref 0.00–1.49)
Prothrombin Time: 13.9 seconds (ref 11.6–15.2)

## 2011-08-01 LAB — CARDIAC PANEL(CRET KIN+CKTOT+MB+TROPI)
CK, MB: 6.3 ng/mL (ref 0.3–4.0)
CK, MB: 6.5 ng/mL (ref 0.3–4.0)
CK, MB: 6.7 ng/mL (ref 0.3–4.0)
Relative Index: INVALID (ref 0.0–2.5)
Relative Index: 5.6 — ABNORMAL HIGH (ref 0.0–2.5)
Relative Index: INVALID (ref 0.0–2.5)
Total CK: 76 U/L (ref 7–177)
Total CK: 116 U/L (ref 7–177)
Total CK: 80 U/L (ref 7–177)
Troponin I: 0.64 ng/mL (ref <0.30)
Troponin I: 0.58 ng/mL (ref <0.30)
Troponin I: 0.41 ng/mL (ref <0.30)

## 2011-08-01 LAB — APTT: aPTT: 31 seconds (ref 24–37)

## 2011-08-01 LAB — PRO B NATRIURETIC PEPTIDE: Pro B Natriuretic peptide (BNP): 12867 pg/mL — ABNORMAL HIGH (ref 0–125)

## 2011-08-01 MED ORDER — ACETAMINOPHEN 325 MG PO TABS: 650.0000 mg | ORAL_TABLET | ORAL | Status: DC | PRN

## 2011-08-01 MED ORDER — IPRATROPIUM BROMIDE 0.02 % IN SOLN
RESPIRATORY_TRACT | Status: AC
  Filled 2011-08-01: qty 2.5

## 2011-08-01 MED ORDER — LACTULOSE 10 GM/15ML PO SOLN
20.0000 g | Freq: Every day | ORAL | Status: DC | PRN
  Filled 2011-08-01: qty 30

## 2011-08-01 MED ORDER — ALBUTEROL SULFATE (5 MG/ML) 0.5% IN NEBU
2.5000 mg | INHALATION_SOLUTION | RESPIRATORY_TRACT | Status: DC | PRN
  Filled 2011-08-01 (×2): qty 0.5

## 2011-08-01 MED ORDER — HEPARIN BOLUS VIA INFUSION
2000.0000 [IU] | Freq: Once | INTRAVENOUS | Status: AC
  Administered 2011-08-01: 2000 [IU] via INTRAVENOUS
  Filled 2011-08-01: qty 2000

## 2011-08-01 MED ORDER — THERA M PLUS PO TABS
1.0000 | ORAL_TABLET | Freq: Every day | ORAL | Status: DC
  Administered 2011-08-01 – 2011-08-02 (×2): 1 via ORAL
  Administered 2011-08-03: 14:00:00 via ORAL
  Administered 2011-08-04: 1 via ORAL
  Filled 2011-08-01 (×4): qty 1

## 2011-08-01 MED ORDER — ASPIRIN EC 325 MG PO TBEC
325.0000 mg | DELAYED_RELEASE_TABLET | Freq: Every day | ORAL | Status: DC
  Administered 2011-08-01 – 2011-08-04 (×4): 325 mg via ORAL
  Filled 2011-08-01 (×4): qty 1

## 2011-08-01 MED ORDER — SODIUM CHLORIDE 0.9 % IV SOLN
INTRAVENOUS | Status: DC
  Administered 2011-08-01: 1000 mL via INTRAVENOUS

## 2011-08-01 MED ORDER — FUROSEMIDE 10 MG/ML IJ SOLN
INTRAMUSCULAR | Status: AC
  Administered 2011-08-01: 40 mg via INTRAMUSCULAR
  Filled 2011-08-01: qty 4

## 2011-08-01 MED ORDER — BUDESONIDE 0.5 MG/2ML IN SUSP
0.5000 mg | Freq: Two times a day (BID) | RESPIRATORY_TRACT | Status: DC
  Administered 2011-08-02 – 2011-08-04 (×4): 0.5 mg via RESPIRATORY_TRACT
  Filled 2011-08-01 (×11): qty 2

## 2011-08-01 MED ORDER — ENOXAPARIN SODIUM 30 MG/0.3ML ~~LOC~~ SOLN
30.0000 mg | SUBCUTANEOUS | Status: DC
  Administered 2011-08-02: 30 mg via SUBCUTANEOUS
  Filled 2011-08-01 (×2): qty 0.3

## 2011-08-01 MED ORDER — ENALAPRIL MALEATE 5 MG PO TABS
5.0000 mg | ORAL_TABLET | Freq: Every day | ORAL | Status: DC
  Administered 2011-08-01 – 2011-08-04 (×4): 5 mg via ORAL
  Filled 2011-08-01 (×4): qty 1

## 2011-08-01 MED ORDER — BUTALBITAL-APAP-CAFFEINE 50-325-40 MG PO TABS
1.0000 | ORAL_TABLET | ORAL | Status: DC | PRN
  Administered 2011-08-01 – 2011-08-02 (×4): 1 via ORAL
  Filled 2011-08-01: qty 1

## 2011-08-01 MED ORDER — ONDANSETRON HCL 4 MG PO TABS: 4.0000 mg | ORAL_TABLET | Freq: Four times a day (QID) | ORAL | Status: DC | PRN

## 2011-08-01 MED ORDER — IPRATROPIUM BROMIDE 0.02 % IN SOLN
RESPIRATORY_TRACT | Status: AC
  Administered 2011-08-01: 0.5 mg via RESPIRATORY_TRACT
  Filled 2011-08-01: qty 2.5

## 2011-08-01 MED ORDER — ALBUTEROL SULFATE (5 MG/ML) 0.5% IN NEBU
INHALATION_SOLUTION | RESPIRATORY_TRACT | Status: AC
  Filled 2011-08-01: qty 0.5

## 2011-08-01 MED ORDER — PREDNISONE 50 MG PO TABS
50.0000 mg | ORAL_TABLET | Freq: Every day | ORAL | Status: DC
  Administered 2011-08-01 – 2011-08-02 (×2): 50 mg via ORAL
  Filled 2011-08-01 (×2): qty 1

## 2011-08-01 MED ORDER — ALBUTEROL SULFATE (5 MG/ML) 0.5% IN NEBU
INHALATION_SOLUTION | RESPIRATORY_TRACT | Status: AC
  Administered 2011-08-01: 2.5 mg via RESPIRATORY_TRACT
  Filled 2011-08-01: qty 0.5

## 2011-08-01 MED ORDER — HEPARIN (PORCINE) IN NACL 100-0.45 UNIT/ML-% IJ SOLN
700.0000 [IU]/h | INTRAMUSCULAR | Status: DC
  Administered 2011-08-01: 700 [IU]/h via INTRAVENOUS
  Filled 2011-08-01: qty 250

## 2011-08-01 MED ORDER — ONDANSETRON HCL 4 MG/2ML IJ SOLN: 4.0000 mg | Freq: Four times a day (QID) | INTRAMUSCULAR | Status: DC | PRN

## 2011-08-01 MED ORDER — BUTALBITAL-APAP-CAFFEINE 50-325-40 MG PO TABS
ORAL_TABLET | ORAL | Status: AC
  Administered 2011-08-01: 1 via ORAL
  Filled 2011-08-01: qty 1

## 2011-08-01 MED ORDER — ROSUVASTATIN CALCIUM 40 MG PO TABS
40.0000 mg | ORAL_TABLET | Freq: Every day | ORAL | Status: DC
  Administered 2011-08-01 – 2011-08-02 (×2): 40 mg via ORAL
  Filled 2011-08-01 (×3): qty 1

## 2011-08-01 MED ORDER — IPRATROPIUM BROMIDE 0.02 % IN SOLN
0.5000 mg | Freq: Four times a day (QID) | RESPIRATORY_TRACT | Status: DC
  Administered 2011-08-01 – 2011-08-04 (×12): 0.5 mg via RESPIRATORY_TRACT
  Filled 2011-08-01 (×20): qty 2.5

## 2011-08-01 MED ORDER — ENOXAPARIN SODIUM 40 MG/0.4ML ~~LOC~~ SOLN
40.0000 mg | SUBCUTANEOUS | Status: DC
  Filled 2011-08-01: qty 0.4

## 2011-08-01 MED ORDER — FUROSEMIDE 10 MG/ML IJ SOLN
INTRAMUSCULAR | Status: AC
  Filled 2011-08-01: qty 4

## 2011-08-01 MED ORDER — NITROGLYCERIN 2 % TD OINT
0.5000 [in_us] | TOPICAL_OINTMENT | Freq: Four times a day (QID) | TRANSDERMAL | Status: DC
  Administered 2011-08-01 (×2): 0.5 [in_us] via TOPICAL
  Filled 2011-08-01: qty 30

## 2011-08-01 MED ORDER — OMEGA-3-ACID ETHYL ESTERS 1 G PO CAPS
1.0000 g | ORAL_CAPSULE | Freq: Every day | ORAL | Status: DC
  Administered 2011-08-01 – 2011-08-04 (×4): 1 g via ORAL
  Filled 2011-08-01 (×4): qty 1

## 2011-08-01 MED ORDER — SODIUM CHLORIDE 0.9 % IJ SOLN
3.0000 mL | Freq: Two times a day (BID) | INTRAMUSCULAR | Status: DC
  Administered 2011-08-01 – 2011-08-04 (×4): 3 mL via INTRAVENOUS

## 2011-08-01 MED ORDER — PANTOPRAZOLE SODIUM 40 MG PO TBEC
40.0000 mg | DELAYED_RELEASE_TABLET | Freq: Every day | ORAL | Status: DC
  Administered 2011-08-01 – 2011-08-04 (×4): 40 mg via ORAL
  Filled 2011-08-01 (×6): qty 1

## 2011-08-01 MED ORDER — FUROSEMIDE 10 MG/ML IJ SOLN
40.0000 mg | Freq: Two times a day (BID) | INTRAMUSCULAR | Status: DC
  Administered 2011-08-01 – 2011-08-02 (×2): 40 mg via INTRAVENOUS
  Filled 2011-08-01 (×2): qty 4

## 2011-08-01 MED ORDER — CALCIUM CARBONATE 1250 (500 CA) MG PO TABS
1.0000 | ORAL_TABLET | Freq: Every day | ORAL | Status: DC
  Administered 2011-08-01 – 2011-08-02 (×2): 500 mg via ORAL
  Administered 2011-08-03: 200 mg via ORAL
  Administered 2011-08-04: 500 mg via ORAL
  Filled 2011-08-01 (×4): qty 1

## 2011-08-01 MED ORDER — ALBUTEROL SULFATE (5 MG/ML) 0.5% IN NEBU
2.5000 mg | INHALATION_SOLUTION | Freq: Four times a day (QID) | RESPIRATORY_TRACT | Status: DC
  Administered 2011-08-01 – 2011-08-02 (×5): 2.5 mg via RESPIRATORY_TRACT
  Filled 2011-08-01 (×14): qty 0.5

## 2011-08-01 MED ORDER — FUROSEMIDE 10 MG/ML IJ SOLN: 20.0000 mg | Freq: Every day | INTRAMUSCULAR | Status: DC

## 2011-08-01 MED ORDER — FUROSEMIDE 10 MG/ML IJ SOLN
40.0000 mg | Freq: Every day | INTRAMUSCULAR | Status: DC
Start: 2011-08-01 — End: 2011-08-01
  Administered 2011-08-01: 40 mg via INTRAMUSCULAR

## 2011-08-01 MED ORDER — FUROSEMIDE 10 MG/ML IJ SOLN: 20.0000 mg | Freq: Two times a day (BID) | INTRAMUSCULAR | Status: DC

## 2011-08-01 MED ORDER — NORTRIPTYLINE HCL 25 MG PO CAPS
25.0000 mg | ORAL_CAPSULE | Freq: Every day | ORAL | Status: DC
  Administered 2011-08-01 – 2011-08-03 (×3): 25 mg via ORAL
  Filled 2011-08-01 (×4): qty 1

## 2011-08-01 MED ORDER — NITROGLYCERIN 2 % TD OINT
0.5000 [in_us] | TOPICAL_OINTMENT | Freq: Four times a day (QID) | TRANSDERMAL | Status: DC
  Filled 2011-08-01 (×16): qty 1

## 2011-08-01 NOTE — Progress Notes (Signed)
Subjective:  Doing much better today. Awake and oriented x 3. No signs of altered mental status. Excellent urine output with lasix. Has never had any chest pain. Breathing is improved.  Objective:  Temp:  [97.3 F (36.3 C)-98.3 F (36.8 C)] 97.5 F (36.4 C) (11/04 1148) Pulse Rate:  [84-94] 90  (11/04 1148) Resp:  [16-19] 18  (11/04 1148) BP: (109-138)/(69-84) 138/77 mmHg (11/04 1148) SpO2:  [85 %-95 %] 85 % (11/04 1148) Weight:  [56.69 kg (124 lb 15.7 oz)] 124 lb 15.7 oz (56.69 kg) (11/03 2300) Weight change:   Intake/Output from previous day: 11/03 0701 - 11/04 0700 In: -  Out: 300 [Urine:300]  Intake/Output from this shift: Total I/O In: -  Out: 1226 [Urine:1200; Emesis/NG output:26]  Medications: Current Facility-Administered Medications  Medication Dose Route Frequency Provider Last Rate Last Dose  . 0.9 %  sodium chloride infusion   Intravenous Continuous Julian Crowford Darlina Guys., PHARMD 15 mL/hr at 08/01/11 0030 1,000 mL at 08/01/11 0030  . acetaminophen (TYLENOL) tablet 650 mg  650 mg Oral Q4H PRN Julian Crowford Darlina Guys., PHARMD      . albuterol (PROVENTIL) (5 MG/ML) 0.5% nebulizer solution 2.5 mg  2.5 mg Nebulization Q6H Julian Crowford Darlina Guys., PHARMD   2.5 mg at 08/01/11 0935  . albuterol (PROVENTIL) (5 MG/ML) 0.5% nebulizer solution 2.5 mg  2.5 mg Nebulization Q2H PRN Julian Crowford Darlina Guys., PHARMD      . aspirin EC tablet 325 mg  325 mg Oral Daily Lorenza Evangelist, PHARMD   325 mg at 08/01/11 1023  . budesonide (PULMICORT) nebulizer solution 0.5 mg  0.5 mg Nebulization BID Jacquenette Shone Crowford Darlina Guys., PHARMD      . butalbital-acetaminophen-caffeine (FIORICET, ESGIC) [phone] MG per tablet 1 tablet  1 tablet Oral Q4H PRN Tressie Ellis Darlina Guys., PHARMD   1 tablet at 08/01/11 0115  . calcium carbonate (OS-CAL - dosed in mg of elemental calcium) tablet 500 mg of elemental calcium  1 tablet Oral Daily Lorenza Evangelist, PHARMD   500 mg of  elemental calcium at 08/01/11 1024  . enalapril (VASOTEC) tablet 5 mg  5 mg Oral Daily Lorenza Evangelist, PHARMD   5 mg at 08/01/11 1024  . furosemide (LASIX) injection 20 mg  20 mg Intravenous Daily Hind I. Elsaid      . heparin 100 units/mL bolus via infusion 2,000 Units  2,000 Units Intravenous Once [facility] Darlina Guys., PHARMD   2,000 Units at 08/01/11 0120  . heparin ADULT infusion 100 units/mL (25000 units/250 mL)  700 Units/hr Intravenous Continuous [id number] Darlina Guys., PHARMD 7 mL/hr at 08/01/11 0125 700 Units/hr at 08/01/11 0125  . ipratropium (ATROVENT) nebulizer solution 0.5 mg  0.5 mg Nebulization Q6H Julian Crowford Darlina Guys., PHARMD   0.5 mg at 08/01/11 0930  . lactulose (CHRONULAC) 10 GM/15ML solution 20 g  20 g Oral Daily PRN Lorenza Evangelist, PHARMD      . multivitamins ther. w/minerals tablet 1 tablet  1 tablet Oral Daily Lorenza Evangelist, PHARMD   1 tablet at 08/01/11 1024  . nortriptyline (PAMELOR) capsule 25 mg  25 mg Oral QHS Lorenza Evangelist, PHARMD      . omega-3 acid ethyl esters (LOVAZA) capsule 1 g  1 g Oral Daily Lorenza Evangelist, PHARMD   1 g at 08/01/11 1024  . ondansetron (ZOFRAN) injection 4 mg  4 mg Intravenous Q6H PRN Jacquenette Shone Crowford Darlina Guys., PHARMD      .  ondansetron (ZOFRAN) tablet 4 mg  4 mg Oral Q6H PRN Julian Crowford Darlina Guys., PHARMD      . pantoprazole (PROTONIX) EC tablet 40 mg  40 mg Oral Q1200 Lorenza Evangelist, PHARMD   40 mg at 08/01/11 1207  . predniSONE (DELTASONE) tablet 50 mg  50 mg Oral QAC breakfast Lorenza Evangelist, PHARMD   50 mg at 08/01/11 0851  . rosuvastatin (CRESTOR) tablet 40 mg  40 mg Oral q1800 Lorenza Evangelist, PHARMD      . sodium chloride 0.9 % injection 3 mL  3 mL Intravenous Q12H Julian Crowford Darlina Guys., PHARMD      . DISCONTD: albuterol (PROVENTIL) (5 MG/ML) 0.5% nebulizer solution           . DISCONTD: enoxaparin (LOVENOX) injection 40 mg  40 mg Subcutaneous Q24H Julian Crowford Darlina Guys.,  PHARMD      . DISCONTD: furosemide (LASIX) injection 40 mg  40 mg Intramuscular Daily [id number] Darlina Guys., PHARMD   40 mg at 08/01/11 1032  . DISCONTD: ipratropium (ATROVENT) 0.02 % nebulizer solution           . DISCONTD: nitroGLYCERIN (NITROGLYN) 2 % ointment 0.5 inch  0.5 inch Topical Q6H Julian Crowford Darlina Guys., PHARMD      . DISCONTD: nitroGLYCERIN (NITROGLYN) 2 % ointment 0.5 inch  0.5 inch Topical Q6H Julian Crowford Darlina Guys., PHARMD   0.5 inch at 08/01/11 1207    Physical Exam: General appearance: alert and no distress Neck: JVD - 3 cm above sternal notch, no adenopathy, no carotid bruit, supple, symmetrical, trachea midline and thyroid not enlarged, symmetric, no tenderness/mass/nodules Lungs: diminished breath sounds bibasilar Heart: regular rate and rhythm, no S3 or S4 and systolic murmur: systolic ejection 2/6, musical at 2nd right intercostal space Abdomen: soft, non-tender; bowel sounds normal; no masses,  no organomegaly Extremities: edema trace edema Pulses: 2+ and symmetric Skin: Skin color, texture, turgor normal. No rashes or lesions Neurologic: Grossly normal  Lab Results: Results for orders placed during the hospital encounter of 07/31/11 (from the past 48 hour(s))  BLOOD GAS, ARTERIAL     Status: Abnormal   Collection Time   07/31/11  4:29 PM      Component Value Range Comment   O2 Content 3.0      Delivery systems NASAL CANNULA      pH, Arterial 7.255 (*) 7.350 - 7.400     pCO2 arterial 73.9 (*) 35.0 - 45.0 (mmHg)    pO2, Arterial 77.4 (*) 80.0 - 100.0 (mmHg)    Bicarbonate 31.7 (*) 20.0 - 24.0 (mEq/L)    TCO2 29.4  0 - 100 (mmol/L)    Acid-Base Excess 2.8 (*) 0.0 - 2.0 (mmol/L)    O2 Saturation 93.4      Patient temperature 98.6      Collection site LEFT BRACHIAL      Drawn by [id number]      Sample type ARTERIAL DRAW     DIFFERENTIAL     Status: Abnormal   Collection Time   07/31/11  5:05 PM      Component Value Range Comment   Neutrophils  Relative 78 (*) 43 - 77 (%)    Lymphocytes Relative 12  12 - 46 (%)    Monocytes Relative 9  3 - 12 (%)    Eosinophils Relative 0  0 - 5 (%)    Basophils Relative 1  0 - 1 (%)    Neutro Abs 7.1  1.7 -  7.7 (K/uL)    Lymphs Abs 1.1  0.7 - 4.0 (K/uL)    Monocytes Absolute 0.8  0.1 - 1.0 (K/uL)    Eosinophils Absolute 0.0  0.0 - 0.7 (K/uL)    Basophils Absolute 0.1  0.0 - 0.1 (K/uL)    Smear Review MORPHOLOGY UNREMARKABLE     CBC     Status: Abnormal   Collection Time   07/31/11  5:05 PM      Component Value Range Comment   WBC 9.1  4.0 - 10.5 (K/uL)    RBC 5.13 (*) 3.87 - 5.11 (MIL/uL)    Hemoglobin 13.4  12.0 - 15.0 (g/dL)    HCT 09.8  11.9 - 14.7 (%)    MCV 86.7  78.0 - 100.0 (fL)    MCH 26.1  26.0 - 34.0 (pg)    MCHC 30.1  30.0 - 36.0 (g/dL)    RDW 82.9 (*) 56.2 - 15.5 (%)    Platelets    150 - 400 (K/uL)    Value: PLATELET CLUMPS NOTED ON SMEAR, COUNT APPEARS ADEQUATE  COMPREHENSIVE METABOLIC PANEL     Status: Abnormal   Collection Time   07/31/11  5:05 PM      Component Value Range Comment   Sodium 136  135 - 145 (mEq/L)    Potassium 5.6 (*) 3.5 - 5.1 (mEq/L)    Chloride 98  96 - 112 (mEq/L)    CO2 32  19 - 32 (mEq/L)    Glucose, Bld 97  70 - 99 (mg/dL)    BUN 24 (*) 6 - 23 (mg/dL)    Creatinine, Ser 1.30 (*) 0.50 - 1.10 (mg/dL)    Calcium 9.3  8.4 - 10.5 (mg/dL)    Total Protein 7.3  6.0 - 8.3 (g/dL)    Albumin 3.0 (*) 3.5 - 5.2 (g/dL)    AST 865 (*) 0 - 37 (U/L)    ALT 115 (*) 0 - 35 (U/L)    Alkaline Phosphatase 190 (*) 39 - 117 (U/L)    Total Bilirubin 0.2 (*) 0.3 - 1.2 (mg/dL)    GFR calc non Af Amer 32 (*) >90 (mL/min)    GFR calc Af Amer 37 (*) >90 (mL/min)   ETHANOL     Status: Normal   Collection Time   07/31/11  5:05 PM      Component Value Range Comment   Alcohol, Ethyl (B) <11  0 - 11 (mg/dL)   AMMONIA     Status: Abnormal   Collection Time   07/31/11  5:05 PM      Component Value Range Comment   Ammonia <10 (*) 11 - 60 (umol/L)   PRO B NATRIURETIC  PEPTIDE     Status: Abnormal   Collection Time   07/31/11  5:05 PM      Component Value Range Comment   BNP, POC 20807.0 (*) 0 - 125 (pg/mL)   URINE MICROSCOPIC-ADD ON     Status: Normal   Collection Time   07/31/11  5:42 PM      Component Value Range Comment   Squamous Epithelial / LPF RARE  RARE     WBC, UA 3-6  <3 (WBC/hpf)    Urine-Other MUCOUS PRESENT     URINALYSIS, ROUTINE W REFLEX MICROSCOPIC     Status: Abnormal   Collection Time   07/31/11  5:42 PM      Component Value Range Comment   Color, Urine YELLOW  YELLOW     Appearance  HAZY (*) CLEAR     Specific Gravity, Urine 1.025  1.005 - 1.030     pH 6.0  5.0 - 8.0     Glucose, UA NEGATIVE  NEGATIVE (mg/dL)    Hgb urine dipstick NEGATIVE  NEGATIVE     Bilirubin Urine NEGATIVE  NEGATIVE     Ketones, ur 40 (*) NEGATIVE (mg/dL)    Protein, ur 30 (*) NEGATIVE (mg/dL)    Urobilinogen, UA 0.2  0.0 - 1.0 (mg/dL)    Nitrite NEGATIVE  NEGATIVE     Leukocytes, UA TRACE (*) NEGATIVE    URINE RAPID DRUG SCREEN (HOSP PERFORMED)     Status: Abnormal   Collection Time   07/31/11  5:43 PM      Component Value Range Comment   Opiates NONE DETECTED  NONE DETECTED     Cocaine NONE DETECTED  NONE DETECTED     Benzodiazepines POSITIVE (*) NONE DETECTED     Amphetamines NONE DETECTED  NONE DETECTED     Tetrahydrocannabinol NONE DETECTED  NONE DETECTED     Barbiturates POSITIVE (*) NONE DETECTED    POCT I-STAT TROPONIN I     Status: Abnormal   Collection Time   07/31/11  6:27 PM      Component Value Range Comment   Troponin i, poc 0.43 (*) 0.00 - 0.08 (ng/mL)    Comment NOTIFIED PHYSICIAN      Comment 3            TROPONIN I     Status: Abnormal   Collection Time   07/31/11  6:45 PM      Component Value Range Comment   Troponin I 0.60 (*) <0.30 (ng/mL)   CBC     Status: Abnormal   Collection Time   08/01/11  1:46 AM      Component Value Range Comment   WBC 8.2  4.0 - 10.5 (K/uL)    RBC 5.08  3.87 - 5.11 (MIL/uL)    Hemoglobin 12.9   12.0 - 15.0 (g/dL)    HCT 16.1  09.6 - 04.5 (%)    MCV 87.2  78.0 - 100.0 (fL)    MCH 25.4 (*) 26.0 - 34.0 (pg)    MCHC 29.1 (*) 30.0 - 36.0 (g/dL)    RDW 40.9 (*) 81.1 - 15.5 (%)    Platelets 242  150 - 400 (K/uL)   PROTIME-INR     Status: Normal   Collection Time   08/01/11  1:46 AM      Component Value Range Comment   Prothrombin Time 13.9  11.6 - 15.2 (seconds)    INR 1.05  0.00 - 1.49    APTT     Status: Normal   Collection Time   08/01/11  1:46 AM      Component Value Range Comment   aPTT 31  24 - 37 (seconds)   CARDIAC PANEL(CRET KIN+CKTOT+MB+TROPI)     Status: Abnormal   Collection Time   08/01/11  1:46 AM      Component Value Range Comment   Total CK 76  7 - 177 (U/L)    CK, MB 6.3 (*) 0.3 - 4.0 (ng/mL)    Troponin I 0.64 (*) <0.30 (ng/mL)    Relative Index RELATIVE INDEX IS INVALID  0.0 - 2.5    PRO B NATRIURETIC PEPTIDE     Status: Abnormal   Collection Time   08/01/11  1:50 AM      Component Value Range Comment  BNP, POC 12867.0 (*) 0 - 125 (pg/mL)   CARDIAC PANEL(CRET KIN+CKTOT+MB+TROPI)     Status: Abnormal   Collection Time   08/01/11  5:20 AM      Component Value Range Comment   Total CK 116  7 - 177 (U/L) MODERATE HEMOLYSIS   CK, MB 6.5 (*) 0.3 - 4.0 (ng/mL) CRITICAL VALUE NOTED.  VALUE IS CONSISTENT WITH PREVIOUSLY REPORTED AND CALLED VALUE.   Troponin I 0.58 (*) <0.30 (ng/mL)    Relative Index 5.6 (*) 0.0 - 2.5    CARDIAC PANEL(CRET KIN+CKTOT+MB+TROPI)     Status: Abnormal   Collection Time   08/01/11  8:02 AM      Component Value Range Comment   Total CK 80  7 - 177 (U/L)    CK, MB 6.7 (*) 0.3 - 4.0 (ng/mL) CRITICAL VALUE NOTED.  VALUE IS CONSISTENT WITH PREVIOUSLY REPORTED AND CALLED VALUE.   Troponin I 0.41 (*) <0.30 (ng/mL)    Relative Index RELATIVE INDEX IS INVALID  0.0 - 2.5      Imaging: Dg Chest 2 View  07/31/2011  *RADIOLOGY REPORT*  Clinical Data: Left arm pain.  Hypertension.  CHEST - 2 VIEW  Comparison: 06/04/2011  Findings: The  cardiopericardial silhouette is enlarged. There is pulmonary vascular congestion without overt pulmonary edema. Diffuse interstitial opacity suggests superimposed interstitial pulmonary edema.  No pleural effusion. Imaged bony structures of the thorax are intact.  IMPRESSION: Cardiomegaly with vascular congestion and probable interstitial pulmonary edema.  Original Report Authenticated By: ERIC A. MANSELL, M.D.   Ct Head Wo Contrast  07/31/2011  *RADIOLOGY REPORT*  Clinical Data:  Headache.  CT HEAD WITHOUT CONTRAST  Technique: Contiguous axial images were obtained from the base of the skull through the vertex without contrast.  Comparison: None.  Findings: Normal appearing cerebral hemispheres and posterior fossa structures.  Normal size and position of the ventricles.  No intracranial hemorrhage, mass lesion or evidence of acute infarction.  Unremarkable bones and included portions of the paranasal sinuses.  IMPRESSION: Normal examination.  Original Report Authenticated By: Darrol Angel, M.D.   US Abdomen Complete  08/01/2011  *RADIOLOGY REPORT*  Clinical Data: Elevated LFTs  ABDOMEN ULTRASOUND  Technique:  Complete abdominal ultrasound examination was performed including evaluation of the liver, gallbladder, bile ducts, pancreas, kidneys, spleen, IVC, and abdominal aorta.  Comparison: No comparison studies available.  Findings:  Gallbladder:  Surgically absent  Common Bile Duct:  Nondilated at 4 mm diameter.  Liver:  Mild coarsening of the echotexture.  No focal intraparenchymal abnormality.  IVC:  Normal.  Pancreas:  Not well seen secondary to midline bowel gas.  Spleen:  Normal.  Right kidney:  10.3 cm in long axis.  Normal.  Left kidney:  10.6 cm in long axis.  8 mm tiny exophytic cyst is evident.  Abdominal Aorta:  No aneurysm.  A small amount of intraperitoneal ascites is evident.  IMPRESSION: Small amount of intraperitoneal free fluid.  Original Report Authenticated By: ERIC A. MANSELL, M.D.   Dg  Chest Portable 1 View  08/01/2011  *RADIOLOGY REPORT*  Clinical Data: CHF, asthma, elevated troponin  PORTABLE CHEST - 1 VIEW  Comparison: 08/01/2011  Findings: Heart is enlarged with central vascular congestion and diffuse interstitial prominence versus mild edema pattern.  Early CHF not excluded.  No interval change compared to earlier today. No enlarging effusion or pneumothorax.  Postop changes in the right axilla.  Trachea is midline.  IMPRESSION: Cardiomegaly with mild interstitial edema pattern.  Stable.  Original Report Authenticated By: Judie Petit. Ruel Favors, M.D.    Assessment:  1. Principal Problem: 2.  *Altered mental status 3. Active Problems: 4.  CHF (congestive heart failure) 5.  COPD (chronic obstructive pulmonary disease) 6.  Hypercapnemia 7.  Non-STEMI (non-ST elevated myocardial infarction) 8.  Mild aortic stenosis 9.  CAD (coronary artery disease) 10.  Hyperlipidemia 11.  Tobacco abuse 12.  S/P cholecystectomy 13.   Plan:  1. Mild stable troponin elevations .Marland Kitchen May be due to strain, aortic stenosis in the setting of heart failure or acute renal failure.  Doubt acute coronary syndrome of any significance.  Will discontinue heparin - lovenox for prophylaxis only. 2. Agree with a repeat 2D echo, to assess for newly decreased LVEF. 3. Would continue to diurese as you are with lasix IV. BNP is improving. 4. Plan likely lexiscan NST on Tuesday of this coming week.  Time Spent Directly with Patient:  30 minutes  Length of Stay:  LOS: 1 day    HILTY,Kenneth C 08/01/2011, 4:07 PM

## 2011-08-01 NOTE — H&P (Signed)
NAMEMAILIN, COGLIANESE               ACCOUNT NO.:  [id number]  MEDICAL RECORD NO.:  [id number]  LOCATION:  [facility]                         FACILITY:  [facility]  PHYSICIAN:  Arne Cleveland, MD       DATE OF BIRTH:  March 19, 1946  DATE OF ADMISSION:  07/31/2011 DATE OF DISCHARGE:                             HISTORY & PHYSICAL   PRIMARY CARE PHYSICIAN:  Meredith L. Webb Silversmith, MD  CHIEF COMPLAINT:  Anxiety.  HISTORY OF PRESENT ILLNESS:  Ms. Shinall is a 65 year old, Caucasian female who has a history of breast cancer, chronic obstructive pulmonary disease, coronary artery disease, and tobacco use as well as anxiety and depression, and tobacco abuse.  She was noted by her daughter to be overmedicated and the daughter called paramedics because she felt that she had taken more Xanax than was prescribed.  Paramedics got to the house and her pulse ox was in the 80%.  She normally is on home O2 and had not been using it.  She denied any chest pain, shortness of breath, nausea, vomiting, fever, or chills, just was more or less overmedicated. However, the patient was put on oxygen and brought to the emergency room.  FAMILY HISTORY:  She was unable to recall, but she knows that there is heart disease in her family.  SOCIAL HISTORY:  Lives with daughter, continues to smoke about half a pack per day.  Denies alcohol or drug use.  ALLERGIES:  She is allergic to ROCEPHIN causes angioedema and shortness of breath.  REVIEW OF SYSTEMS:  As per history of present illness.  Comprehensive review of system was done.  The only positive findings were those of her chronic medical problems in history of present illness.  PHYSICAL EXAMINATION:  VITAL SIGNS:  Temperature is 98.6, pulse 86, respirations 18, blood pressure 98/62. GENERAL:  The patient appears overmedicated, but in no acute distress, on O2 per nasal cannula. HEENT:  Her head is atraumatic, normocephalic.  Pupils are equal, round, and reactive to light.   Discs sharp.  Extraocular muscle range of motion is full. NECK:  Supple without jugular venous distention, thyromegaly, or thyroid mass. CHEST:  Nontender.  Decreased breath sounds at the bases and plus-minus rales at the bases. BREASTS:  She has a right lumpectomy. HEART:  Regular rhythm and rate.  Normal S1 and S2, without murmur. ABDOMEN:  Protuberant, soft, nontender, with normoactive bowel sounds. PELVIC AND RECTAL:  Deferred at patient's request. EXTREMITIES:  There was no clubbing, cyanosis, or edema. SKIN:  No unusual rash or lesions. NEURO:  The patient is oriented x3.  Cranial nerves, she is awake, but seems to be overmedicated consistent with having taken more than her prescribed dose of Xanax, however, the patient denies doing this.  Her motor strength is 5/5 throughout and sensory is intact to fine touch and pinprick.  LABORATORY DATA:  Her urinalysis is negative except for trace leukocytes.  Comprehensive metabolic panel, her potassium is slightly elevated at 5.6, BUN is 24, creatinine is elevated at 1.62.  Her liver enzymes are elevated at 119/115 international units per liter for AST and ALT.  Alkaline phosphatase is elevated at 190, bilirubin is normal. GFR is low.  Urine drug screen, opiates are not detected, cocaine not detected, benzodiazepines positive, but she is on Xanax, amphetamine is not detected, THC is not detected, and barbiturates positive, but the patient is on Fioricet for her migraines.  CBC, hemoglobin is 9.1, hemoglobin 13.4, hematocrit 44.5% with a left shift, 78% neutrophils, normal is 43-77.  Alcohol level is less than 11, 0-11 is in the normal range.  Arterial blood gas, her pH is 7.25, pCO2 is 73, pO2 is 77, bicarbonate 31, O2 saturation 93% and that is on 3 L per nasal cannula. Chest x-ray shows cardiomegaly with vascular congestion and possible interstitial pulmonary edema.  The patient on her last hospitalization had echocardiogram which  showed an ejection fraction of 60% to 65%, left ventricular cavity size was normal, wall thickness was normal, systolic function was normal, aortic valve mildly calcified, left atrium was mildly dilated, atrial septum no defect, no patent ovale identified, pulmonary artery peak pressure 44.  IMPRESSION:  Congestive heart failure, new onset, with a BNP of 20,000 and chest x-ray shows interstitial pulmonary edema.  Chronic obstructive pulmonary disease exacerbation.  Possible overmedication with Xanax and elevated troponins. 1. Congestive heart failure, new onset. 2. Chronic obstructive pulmonary disease exacerbation. 3. Over medicated with Xanax. 4. Elevated troponin.  My plan is to admit the patient to a telemetry bed.  Obtain a cardiology consult, 2 serial EKGs, and serial cardiac enzymes.  Strict I and O's, daily weights.  The patient is already on an ACE inhibitor and cholesterol medication, but is not on Lasix.  I will institute IV Lasix first dose now, discuss the patient Foley catheter to monitor I and O's and early treatment of her CHF.  Obtain another echocardiogram.  Further evaluation and treatment as indicated by hospital course.  This patient is being admitted to  [facility] team 7.     Arne Cleveland, MD     ML/MEDQ  D:  07/31/2011  T:  08/01/2011  Job:  [id number]  cc:   Cheron Every, MD

## 2011-08-01 NOTE — Progress Notes (Signed)
Dr. Eda Paschal was notified that lab was unable to draw am labs with multiple draws and foot sticks.

## 2011-08-01 NOTE — Progress Notes (Signed)
Dr. Eda Paschal on unit- reminded that lab was unable to obtain any labs today due to pt difficult stick.

## 2011-08-01 NOTE — Progress Notes (Signed)
OLD IV SITE FROM ER DEPT. IN LEFT HAND PULLED OUT BY PT & CATH WAS INTACT

## 2011-08-01 NOTE — Progress Notes (Signed)
Recommend using the bed alarm for safety-since recent overmedication of Xanax PTA. Frequently asks for prn's for pain & sleep.

## 2011-08-01 NOTE — Progress Notes (Signed)
Subjective: Very sleepy and lethargic , denies any chest pain, complains about one episode of vomiting. Used oxygen PRN at home , noticed some shortness of breath when talking, no change in BM, no abdominal pain.  Objective: Weight change:   Intake/Output Summary (Last 24 hours) at 08/01/11 1258 Last data filed at 08/01/11 1258  Gross per 24 hour  Intake      0 ml  Output   1100 ml  Net  -1100 ml   Blood pressure 138/77, pulse 90, temperature 97.5 F (36.4 C), temperature source Oral, resp. rate 18, height 5\' 2"  (1.575 m), weight 56.69 kg (124 lb 15.7 oz), SpO2 85.00%.   Physical Exam: General: very lethargic but oriented ,able to arouse and can hold conversation with you , on mild respiratory distressHEENT: anicteric sclera, pupils reactive to light and accommodation CVS: S1-S2 clear no murmur rubs or gallops Chest:bilateral rales ,no active wheezing, sp partial right mastectomy , no LN Abdomen: soft nontender, nondistended, normal bowel sounds, no organomegaly, NO MURPHY SIGN Extremities: no cyanosis, clubbing or edema noted bilaterally Neuro: Cranial nerves II-XII intact, no focal neurological deficits  Lab Results: Results for Barbara, Hopkins (MRN [id number]) as of 08/01/2011 13:08  Ref. Range 07/31/2011 22:26 08/01/2011 01:46 08/01/2011 01:55 08/01/2011 05:20 08/01/2011 08:02  CK, MB Latest Range: 0.3-4.0 ng/mL  6.3 (HH)  6.5 (HH) 6.7 (HH)  CK Total Latest Range: 7-177 U/L  76  116 80  Troponin I Latest Range: <0.30 ng/mL  0.64 (HH)  0.58 (HH) 0.41 (HH)  Results for Barbara, Hopkins (MRN [id number]) as of 08/01/2011 13:08  Ref. Range 07/31/2011 17:05  Sodium Latest Range: 135-145 mEq/L 136  Potassium Latest Range: 3.5-5.1 mEq/L 5.6 (H)  Chloride Latest Range: 96-112 mEq/L 98  CO2 Latest Range: 19-32 mEq/L 32  BUN Latest Range: 6-23 mg/dL 24 (H)  Creat Latest Range: 0.50-1.10 mg/dL 7.82 (H)  Calcium Latest Range: 8.4-10.5 mg/dL 9.3  GFR calc non Af Amer Latest Range: >90 mL/min 32 (L)    GFR calc Af Amer Latest Range: >90 mL/min 37 (L)  Glucose Latest Range: 70-99 mg/dL 97  Alkaline Phosphatase Latest Range: 39-117 U/L 190 (H)  Albumin Latest Range: 3.5-5.2 g/dL 3.0 (L)  AST Latest Range: 0-37 U/L 119 (H)  ALT Latest Range: 0-35 U/L 115 (H)  Total Protein Latest Range: 6.0-8.3 g/dL 7.3  Ammonia Latest Range: 11-60 umol/L <10 (L)  Total Bilirubin Latest Range: 0.3-1.2 mg/dL 0.2 (L)  WBC Latest Range: 4.0-10.5 K/uL 9.1  RBC Latest Range: 3.87-5.11 MIL/uL 5.13 (H)  HGB Latest Range: 12.0-15.0 g/dL 95.6  HCT Latest Range: 36.0-46.0 % 44.5  MCV Latest Range: 78.0-100.0 fL 86.7  MCH Latest Range: 26.0-34.0 pg 26.1  MCHC Latest Range: 30.0-36.0 g/dL 21.3  RDW Latest Range: 11.5-15.5 % 17.6 (H)  Platelets Latest Range: 150-400 K/uL PLATELET CLUMPS NOTED ON SMEAR, COUNT APPEARS ADEQUATE    Micro Results: No results found for this or any previous visit (from the past 240 hour(s)).  Studies/Results: Dg Chest 2 View  IMPRESSION: Cardiomegaly with vascular congestion and probable interstitial pulmonary edema.  Original Report Authenticated By: ERIC A. MANSELL, M.D.   Ct Head Wo Contrast  1.  Findings: Normal appearing cerebral hemispheres and posterior fossa structures.  Normal size and position of the ventricles.  No intracranial hemorrhage, mass lesion or evidence of acute infarction.  Unremarkable bones and included portions of the paranasal sinuses.  IMPRESSION: Normal examination.   Medications: Scheduled Meds:   . albuterol  2.5 mg Nebulization  Q6H  . aspirin EC  325 mg Oral Daily  . budesonide  0.5 mg Nebulization BID  . calcium carbonate  1 tablet Oral Daily  . enalapril  5 mg Oral Daily  . furosemide  40 mg Intramuscular Daily  . heparin  2,000 Units Intravenous Once  . ipratropium  0.5 mg Nebulization Q6H  . multivitamins ther. w/minerals  1 tablet Oral Daily  . nitroGLYCERIN  0.5 inch Topical Q6H  . nortriptyline  25 mg Oral QHS  . omega-3 acid ethyl  esters  1 g Oral Daily  . pantoprazole  40 mg Oral Q1200  . predniSONE  50 mg Oral QAC breakfast  . rosuvastatin  40 mg Oral q1800  . sodium chloride  3 mL Intravenous Q12H  . DISCONTD: enoxaparin (LOVENOX) injection  40 mg Subcutaneous Q24H  . DISCONTD: nitroGLYCERIN  0.5 inch Topical Q6H   Continuous Infusions:   . sodium chloride 1,000 mL (08/01/11 0030)  . heparin 700 Units/hr (08/01/11 0125)   PRN Meds:.acetaminophen, albuterol, butalbital-acetaminophen-caffeine, lactulose, ondansetron, ondansetron  Assessment/Plan: Active Problems:  1-acute CHF: likely diastolic CHF with normal EF,  Repeat Bmet , will decrease lasix 20 mg iv daily, with her history of COPD , Will hold on adding B blocker per cardiology may need cath. 2- Acute on chronic hypoxic and hypercapnoeic respiratory failure  Secondary to copd and likely medications . Agree with nebs and prednisone taper dose./ 3- elevated troponin: rule out NSTEMI , on heparin drip per cardio will watch for any sign of bleeding , ct head negative  4 - abnormal OZH:YQMVHQ negative, will [place] of liver and billiary 5-AMS likely related to medication  And hypercapnoeic, urine screen positive, ct head negative 6- ARF:  Likely secondary to diuretics will decrease the dose to 20 mg. 7- hyperkalemia: repeat bmet today.  8 breast ca sp partial mastectomy followed up by dr Zella Ball , follow out patient .  LOS: 1 day   Sherrine Salberg I. 08/01/2011, 12:58 PM

## 2011-08-02 ENCOUNTER — Inpatient Hospital Stay (HOSPITAL_COMMUNITY): Payer: Medicare Other

## 2011-08-02 ENCOUNTER — Encounter (HOSPITAL_COMMUNITY): Payer: Medicare Other

## 2011-08-02 DIAGNOSIS — R945 Abnormal results of liver function studies: Secondary | ICD-10-CM

## 2011-08-02 DIAGNOSIS — R7989 Other specified abnormal findings of blood chemistry: Secondary | ICD-10-CM

## 2011-08-02 DIAGNOSIS — I472 Ventricular tachycardia: Secondary | ICD-10-CM | POA: Diagnosis not present

## 2011-08-02 DIAGNOSIS — R188 Other ascites: Secondary | ICD-10-CM | POA: Clinically undetermined

## 2011-08-02 HISTORY — DX: Other specified abnormal findings of blood chemistry: R79.89

## 2011-08-02 HISTORY — DX: Abnormal results of liver function studies: R94.5

## 2011-08-02 LAB — CBC
HCT: 43 % (ref 36.0–46.0)
Hemoglobin: 12.6 g/dL (ref 12.0–15.0)
MCH: 25.9 pg — ABNORMAL LOW (ref 26.0–34.0)
MCHC: 29.3 g/dL — ABNORMAL LOW (ref 30.0–36.0)
MCV: 88.5 fL (ref 78.0–100.0)
Platelets: 223 10*3/uL (ref 150–400)
RBC: 4.86 MIL/uL (ref 3.87–5.11)
RDW: 17.8 % — ABNORMAL HIGH (ref 11.5–15.5)
WBC: 7.5 10*3/uL (ref 4.0–10.5)

## 2011-08-02 LAB — URINE CULTURE: Culture  Setup Time: 201211040112

## 2011-08-02 LAB — HEPATIC FUNCTION PANEL
ALT: 156 U/L — ABNORMAL HIGH (ref 0–35)
AST: 106 U/L — ABNORMAL HIGH (ref 0–37)
Albumin: 2.8 g/dL — ABNORMAL LOW (ref 3.5–5.2)
Alkaline Phosphatase: 138 U/L — ABNORMAL HIGH (ref 39–117)
Bilirubin, Direct: <0.1 mg/dL (ref 0.0–0.3)
Total Bilirubin: 0.2 mg/dL — ABNORMAL LOW (ref 0.3–1.2)
Total Protein: 6.6 g/dL (ref 6.0–8.3)

## 2011-08-02 MED ORDER — POLYETHYLENE GLYCOL 3350 17 G PO PACK
17.0000 g | PACK | Freq: Every day | ORAL | Status: DC
  Administered 2011-08-02: 17 g via ORAL
  Filled 2011-08-02 (×3): qty 1

## 2011-08-02 MED ORDER — IPRATROPIUM BROMIDE 0.02 % IN SOLN
RESPIRATORY_TRACT | Status: AC
  Administered 2011-08-02: 0.5 mg via RESPIRATORY_TRACT
  Filled 2011-08-02: qty 2.5

## 2011-08-02 MED ORDER — ALBUTEROL SULFATE (5 MG/ML) 0.5% IN NEBU
2.5000 mg | INHALATION_SOLUTION | Freq: Four times a day (QID) | RESPIRATORY_TRACT | Status: DC
  Administered 2011-08-02 – 2011-08-04 (×7): 2.5 mg via RESPIRATORY_TRACT
  Filled 2011-08-02 (×5): qty 0.5

## 2011-08-02 MED ORDER — ALBUTEROL SULFATE (5 MG/ML) 0.5% IN NEBU
INHALATION_SOLUTION | RESPIRATORY_TRACT | Status: AC
  Administered 2011-08-02: 2.5 mg via RESPIRATORY_TRACT
  Filled 2011-08-02: qty 0.5

## 2011-08-02 MED ORDER — LACTULOSE 10 GM/15ML PO SOLN
20.0000 g | Freq: Every day | ORAL | Status: DC
  Administered 2011-08-03 – 2011-08-04 (×2): 20 g via ORAL
  Filled 2011-08-02 (×2): qty 30

## 2011-08-02 MED ORDER — FUROSEMIDE 10 MG/ML IJ SOLN
20.0000 mg | Freq: Two times a day (BID) | INTRAMUSCULAR | Status: DC
  Administered 2011-08-02 – 2011-08-03 (×3): 20 mg via INTRAVENOUS
  Filled 2011-08-02 (×4): qty 2

## 2011-08-02 MED ORDER — BUTALBITAL-APAP-CAFFEINE 50-325-40 MG PO TABS
ORAL_TABLET | ORAL | Status: AC
  Administered 2011-08-02: 1 via ORAL
  Filled 2011-08-02: qty 1

## 2011-08-02 MED ORDER — PREDNISONE 20 MG PO TABS
40.0000 mg | ORAL_TABLET | Freq: Every day | ORAL | Status: DC
  Administered 2011-08-03 – 2011-08-04 (×2): 40 mg via ORAL
  Filled 2011-08-02 (×2): qty 2

## 2011-08-02 MED ORDER — ALBUTEROL SULFATE (5 MG/ML) 0.5% IN NEBU
2.5000 mg | INHALATION_SOLUTION | RESPIRATORY_TRACT | Status: DC | PRN
Start: 2011-08-02 — End: 2011-08-04
  Filled 2011-08-02: qty 0.5

## 2011-08-02 NOTE — Progress Notes (Signed)
  Echocardiogram 2D Echocardiogram has been performed.  Juanita Laster Rilyn Scroggs, RDCS 08/02/2011, 1:39 PM

## 2011-08-02 NOTE — Progress Notes (Signed)
Pt had a 14 beat run of V-Tach.  Pt is asymptomatic.  Pt has had PVCs since being in the hospital.  MD is aware, will continue to monitor.

## 2011-08-02 NOTE — Progress Notes (Signed)
08/02/11 1512  Aerosol Therapy Tx  Medications Albuterol;Atrovent  Solution Normal saline  Delivery Source Oxygen  Delivery Device HHN  Pre-Treatment Pulse 88   Pre-Treatment Respirations 25   Treatment Tolerance Tolerated well  Pain Assessment  Pain Assessment No/denies pain  Pain Score Zero  RT Breath Sounds  Bilateral Breath Sounds Clear  Oxygen Therapy/Pulse Ox  O2 Device Nasal cannula  O2 Therapy Oxygen  O2 Flow Rate (L/min) 2 L/min

## 2011-08-02 NOTE — Progress Notes (Addendum)
Pt. Seen and examined. Agree with the NP/PA-C note as written. Will plan NST tomorrow. I reviewed her 2D echo today .Marland Kitchen She DOES NOT have aortic stenosis, only aortic sclerosis. LV systolic function is normal, Stage 1 diastolic dysfunction with elevated LV filling pressures. There is also a mild circumferential pericardial effusion. Agree with continued diuresis. She is improving.  I suspect her pulmonary edema is due to hypoxia from xanax overdose + diastolic dysfunction + renal failure.

## 2011-08-02 NOTE — Progress Notes (Addendum)
Subjective: Patient stated she had no chest pain shortness of breath or dizziness. She denies any awareness of palpitations.  Objective:  Vital Signs in the last 24 hours: Temp:  [97.5 F (36.4 C)-99.1 F (37.3 C)] 99 F (37.2 C) (11/05 0500) Pulse Rate:  [78-103] 78  (11/05 0500) Resp:  [16-20] 16  (11/05 0500) BP: (118-138)/(65-82) 118/66 mmHg (11/05 0500) SpO2:  [85 %-97 %] 97 % (11/05 0912) Weight:  [57.6 kg (126 lb 15.8 oz)] 126 lb 15.8 oz (57.6 kg) (11/05 0500)  Intake/Output from previous day: 11/04 0701 - 11/05 0700 In: 288.2 [I.V.:288.2] Out: 2376 [Urine:2350; Emesis/NG output:26] Intake/Output from this shift: Total I/O In: 200 [P.O.:200] Out: 1525 [Urine:1525]  Physical Exam: General: Alert oriented and without complaints. Heart: S1-S2 regular rate and rhythm with a soft systolic murmur. No gallop or rub noted. Lungs: With rales and crackles  halfway up . Abdomen: Soft nontender positive bowel sounds do not palpate liver spleen or masses. Extremities: No edema noted pedal pulses 1+    Lab Results:  Basename 08/02/11 0424 08/01/11 0146  WBC 7.5 8.2  HGB 12.6 12.9  PLT 223 242    Basename 07/31/11 1705  NA 136  K 5.6*  CL 98  CO2 32  GLUCOSE 97  BUN 24*  CREATININE 1.62*    Basename 08/01/11 0802 08/01/11 0520  TROPONINI 0.41* 0.58*   Hepatic Function Panel  Basename 08/02/11 0424  PROT 6.6  ALBUMIN 2.8*  AST 106*  ALT 156*  ALKPHOS 138*  BILITOT 0.2*  BILIDIR <0.1  IBILI NOT CALCULATED   No results found for this basename: CHOL in the last 72 hours No results found for this basename: PROTIME in the last 72 hours  Imaging: Imaging results have been reviewed  Cardiac Studies:  Assessment/Plan:  Patient Active Problem List  Diagnoses  . Altered mental status  . CHF (congestive heart failure)  . COPD (chronic obstructive pulmonary disease)  . Hypercapnemia  . Non-STEMI (non-ST elevated myocardial infarction)  . Mild aortic  stenosis  . CAD (coronary artery disease)  . Hyperlipidemia  . Tobacco abuse  . S/P cholecystectomy  . NSVT (nonsustained ventricular tachycardia)  . Ascites   Plan: Continue IV diuretic as planned. Change lactulose to daily at at bedtime.plan for Lexi scan Myoview 08/03/2011. N.p.o. After midnight for the study. Change IV to saline lock. Recheck BNP in a.m.     LOS: 2 days    INGOLD,LAURA R 08/02/2011, 11:28 AM

## 2011-08-02 NOTE — Progress Notes (Signed)
Subjective: Feels "great" apart from an occasional mild, nonproductive cough. No chest pain, no SOB at rest, no new issues. No bowel movements since admission.  Objective: Vital signs in last 24 hours: Temp:  [98 F (36.7 C)-99.1 F (37.3 C)] 98 F (36.7 C) (11/05 1316) Pulse Rate:  [78-103] 90  (11/05 1316) Resp:  [16-20] 18  (11/05 1316) BP: (118-138)/(65-87) 138/87 mmHg (11/05 1316) SpO2:  [90 %-97 %] 91 % (11/05 1316) Weight:  [57.6 kg (126 lb 15.8 oz)] 126 lb 15.8 oz (57.6 kg) (11/05 0500) Weight change: 0.91 kg (2 lb 0.1 oz) Last BM Date: 08/01/11  Intake/Output from previous day: 11/04 0701 - 11/05 0700 In: 288.2 [I.V.:288.2] Out: 2376 [Urine:2350; Emesis/NG output:26] Total I/O In: 520 [P.O.:440; I.V.:80] Out: 2325 [Urine:2325]   Physical Exam:  General: Comfortable, alert, communicative, fully oriented, not short of breath at rest.  HEENT:  No clinical pallor, no jaundice, no conjunctival injection or discharge. NECK:  Supple, JVP not seen, no carotid bruits, no palpable lymphadenopathy, no palpable goiter. CHEST:  Clinically clear to auscultation, occasional expiratory rhonchi, no crackles. HEART:  Sounds 1 and 2 heard, normal, regular, no murmurs. ABDOMEN:  Full, soft, non-tender, no palpable organomegaly, no palpable masses, normal bowel sounds. LOWER EXTREMITIES:  No pitting edema, palpable peripheral pulses. MUSCULOSKELETAL SYSTEM:  Generalized osteoarthritic changes, otherwise, normal. CENTRAL NERVOUS SYSTEM:  No focal neurologic deficit on gross examination.  Lab Results:  Basename 08/02/11 0424 08/01/11 0146  WBC 7.5 8.2  HGB 12.6 12.9  HCT 43.0 44.3  PLT 223 242    Basename 07/31/11 1705  NA 136  K 5.6*  CL 98  CO2 32  GLUCOSE 97  BUN 24*  CREATININE 1.62*  CALCIUM 9.3   Recent Results (from the past 240 hour(s))  URINE CULTURE     Status: Normal   Collection Time   07/31/11  5:42 PM      Component Value Range Status Comment   Specimen  Description URINE, CLEAN CATCH   Final    Special Requests NONE   Final    Setup Time [id number]   Final    Colony Count 20,OOO COLONIES/ML   Final    Culture INSIGNIFICANT GROWTH   Final    Report Status 08/02/2011 FINAL   Final      Studies/Results: Dg Chest 2 View  07/31/2011  *RADIOLOGY REPORT*  Clinical Data: Left arm pain.  Hypertension.  CHEST - 2 VIEW  Comparison: 06/04/2011  Findings: The cardiopericardial silhouette is enlarged. There is pulmonary vascular congestion without overt pulmonary edema. Diffuse interstitial opacity suggests superimposed interstitial pulmonary edema.  No pleural effusion. Imaged bony structures of the thorax are intact.  IMPRESSION: Cardiomegaly with vascular congestion and probable interstitial pulmonary edema.  Original Report Authenticated By: ERIC A. MANSELL, M.D.   Ct Head Wo Contrast  07/31/2011  *RADIOLOGY REPORT*  Clinical Data:  Headache.  CT HEAD WITHOUT CONTRAST  Technique: Contiguous axial images were obtained from the base of the skull through the vertex without contrast.  Comparison: None.  Findings: Normal appearing cerebral hemispheres and posterior fossa structures.  Normal size and position of the ventricles.  No intracranial hemorrhage, mass lesion or evidence of acute infarction.  Unremarkable bones and included portions of the paranasal sinuses.  IMPRESSION: Normal examination.  Original Report Authenticated By: Darrol Angel, M.D.   US Abdomen Complete  08/01/2011  *RADIOLOGY REPORT*  Clinical Data: Elevated LFTs  ABDOMEN ULTRASOUND  Technique:  Complete abdominal ultrasound examination was  performed including evaluation of the liver, gallbladder, bile ducts, pancreas, kidneys, spleen, IVC, and abdominal aorta.  Comparison: No comparison studies available.  Findings:  Gallbladder:  Surgically absent  Common Bile Duct:  Nondilated at 4 mm diameter.  Liver:  Mild coarsening of the echotexture.  No focal intraparenchymal abnormality.  IVC:   Normal.  Pancreas:  Not well seen secondary to midline bowel gas.  Spleen:  Normal.  Right kidney:  10.3 cm in long axis.  Normal.  Left kidney:  10.6 cm in long axis.  8 mm tiny exophytic cyst is evident.  Abdominal Aorta:  No aneurysm.  A small amount of intraperitoneal ascites is evident.  IMPRESSION: Small amount of intraperitoneal free fluid.  Original Report Authenticated By: ERIC A. MANSELL, M.D.   Dg Chest Portable 1 View  08/01/2011  *RADIOLOGY REPORT*  Clinical Data: CHF, asthma, elevated troponin  PORTABLE CHEST - 1 VIEW  Comparison: 08/01/2011  Findings: Heart is enlarged with central vascular congestion and diffuse interstitial prominence versus mild edema pattern.  Early CHF not excluded.  No interval change compared to earlier today. No enlarging effusion or pneumothorax.  Postop changes in the right axilla.  Trachea is midline.  IMPRESSION: Cardiomegaly with mild interstitial edema pattern.  Stable.  Original Report Authenticated By: Judie Petit. Ruel Favors, M.D.   Dg Chest Portable 1 View  08/01/2011  *RADIOLOGY REPORT*  Clinical Data: COPD.  No current chest complaints.  PORTABLE CHEST  Comparison: None.  Findings: Enlarged cardiac silhouette.  Prominent pulmonary vasculature and interstitial markings.  Mild thoracic spine degenerative changes.  IMPRESSION: Cardiomegaly, pulmonary vascular congestion and mild interstitial lung disease.  Original Report Authenticated By: Darrol Angel, M.D.    Medications: Scheduled Meds:   . albuterol  2.5 mg Nebulization Q6H  . aspirin EC  325 mg Oral Daily  . budesonide  0.5 mg Nebulization BID  . calcium carbonate  1 tablet Oral Daily  . enalapril  5 mg Oral Daily  . enoxaparin (LOVENOX) injection  30 mg Subcutaneous Q24H  . furosemide  40 mg Intravenous BID  . ipratropium  0.5 mg Nebulization Q6H  . lactulose  20 g Oral Daily  . multivitamins ther. w/minerals  1 tablet Oral Daily  . nortriptyline  25 mg Oral QHS  . omega-3 acid ethyl esters  1 g  Oral Daily  . pantoprazole  40 mg Oral Q1200  . predniSONE  50 mg Oral QAC breakfast  . rosuvastatin  40 mg Oral q1800  . sodium chloride  3 mL Intravenous Q12H  . DISCONTD: furosemide  20 mg Intravenous Daily  . DISCONTD: furosemide  20 mg Intravenous BID   Continuous Infusions:   . sodium chloride 10 mL/hr at 08/02/11 0600  . DISCONTD: heparin 700 Units/hr (08/01/11 0125)   PRN Meds:.acetaminophen, albuterol, butalbital-acetaminophen-caffeine, ondansetron, ondansetron, DISCONTD: lactulose  Assessment/Plan:  Principal Problem:  *Altered mental status: Resolved. Now back to baseline mental status. Etiology was probably hypoxemia, secondary to CHF and COPD. Active Problems:  1. CHF (congestive heart failure). Appears clinically compensated at this time. As BNP is  trending down, and creatinine is creeping up, will need to reduce Lasix.  2. COPD (chronic obstructive pulmonary disease). No obvious exacerbation at this time. 3.  Non-STEMI (non-ST elevated myocardial infarction): Patient had mildly elevated cardiac enzymes at presentation. Per cardiologist, this is likely due to demand ischemia. Myoview is planned for 08/03/2011. ivi Heparin continues.  Mild aortic stenosis: Stable.  4. CAD (coronary artery disease) 5.  Hyperlipidemia: On statin. 6.  Tobacco abuse: counseled.  7. Abnormal LFTs: Due to congestive hepatopathy. 8. Constipation: Laxatives given.    LOS: 2 days   Hollynn Garno,CHRISTOPHER 08/02/2011, 4:16 PM

## 2011-08-03 DIAGNOSIS — Z9289 Personal history of other medical treatment: Secondary | ICD-10-CM

## 2011-08-03 HISTORY — DX: Personal history of other medical treatment: Z92.89

## 2011-08-03 LAB — PRO B NATRIURETIC PEPTIDE: Pro B Natriuretic peptide (BNP): 2284 pg/mL — ABNORMAL HIGH (ref 0–125)

## 2011-08-03 LAB — BASIC METABOLIC PANEL
BUN: 17 mg/dL (ref 6–23)
CO2: 36 mEq/L — ABNORMAL HIGH (ref 19–32)
Calcium: 9.5 mg/dL (ref 8.4–10.5)
Chloride: 94 mEq/L — ABNORMAL LOW (ref 96–112)
Creatinine, Ser: 0.73 mg/dL (ref 0.50–1.10)
GFR calc Af Amer: >90 mL/min (ref >90)
GFR calc non Af Amer: 88 mL/min — ABNORMAL LOW (ref >90)
Glucose, Bld: 84 mg/dL (ref 70–99)
Potassium: 3.5 mEq/L (ref 3.5–5.1)
Sodium: 136 mEq/L (ref 135–145)

## 2011-08-03 MED ORDER — ENOXAPARIN SODIUM 40 MG/0.4ML ~~LOC~~ SOLN
40.0000 mg | SUBCUTANEOUS | Status: DC
  Administered 2011-08-03 – 2011-08-04 (×2): 40 mg via SUBCUTANEOUS
  Filled 2011-08-03 (×2): qty 0.4

## 2011-08-03 MED ORDER — TECHNETIUM TC 99M TETROFOSMIN IV KIT
30.0000 | PACK | Freq: Once | INTRAVENOUS | Status: AC | PRN
  Administered 2011-08-03: 33 via INTRAVENOUS

## 2011-08-03 MED ORDER — FUROSEMIDE 40 MG PO TABS
40.0000 mg | ORAL_TABLET | Freq: Every day | ORAL | Status: DC
  Administered 2011-08-04: 40 mg via ORAL
  Filled 2011-08-03 (×2): qty 1

## 2011-08-03 MED ORDER — REGADENOSON 0.4 MG/5ML IV SOLN
0.4000 mg | Freq: Once | INTRAVENOUS | Status: AC
  Administered 2011-08-03: 0.4 mg via INTRAVENOUS

## 2011-08-03 MED ORDER — TECHNETIUM TC 99M TETROFOSMIN IV KIT
10.0000 | PACK | Freq: Once | INTRAVENOUS | Status: AC | PRN
  Administered 2011-08-03: 10 via INTRAVENOUS

## 2011-08-03 NOTE — Progress Notes (Signed)
[facility] following for [facility], if home 02 needed can arrange.Will need 02 sats on room air,resting,ambulation,& recovery. Thanks.

## 2011-08-03 NOTE — Progress Notes (Signed)
ANTICOAGULATION CONSULT NOTE - Follow Up Consult  Pharmacy Consult for Lovenox  Indication: VTE prophylaxis  Allergies  Allergen Reactions  . Ceftriaxone Other (See Comments)    "Blow up like a balloon"    Patient Measurements: Height: 5\' 2"  (157.5 cm) Weight: 126 lb 15.8 oz (57.6 kg) IBW/kg (Calculated) : 50.1    Labs: SCr=0.73, plt =223, wt = 57.6kg  Estimated Creatinine Clearance: 55.4 ml/min (by C-G formula based on Cr of 0.73).   Medications:  Lovenox 30mg  SQ q24h  Assessment: 65 yo F on Lovenox for DVT prophylaxis. SCr has improved, CrCl now > 69ml/min, will renally-adjust Lovenox.   Plan:  Increase Lovenox for 40mg  SQ q24h.  Annia Belt 08/03/2011,8:52 AM

## 2011-08-03 NOTE — Progress Notes (Addendum)
Subjective:  Short of breath but improving. No chest pain  Objective:  Vital Signs in the last 24 hours: Temp:  [98 F (36.7 C)-98.6 F (37 C)] 98.2 F (36.8 C) (11/06 0454) Pulse Rate:  [79-92] 79  (11/06 0652) Resp:  [16-18] 16  (11/06 0652) BP: (119-153)/(72-87) 153/74 mmHg (11/06 0652) SpO2:  [91 %-97 %] 97 % (11/06 0652)  Intake/Output from previous day: 11/05 0701 - 11/06 0700 In: 919.8 [P.O.:680; I.V.:239.8] Out: 4195 [Urine:4195] Intake/Output from this shift:    Physical Exam: General appearance: alert, cooperative and no distress Lungs: rhonchi bilaterally and wheezes bilaterally Heart: regular rate and rhythm, S1, S2 normal, no murmur, click, rub or gallop  Lab Results:  Basename 08/02/11 0424 08/01/11 0146  WBC 7.5 8.2  HGB 12.6 12.9  PLT 223 242    Basename 08/03/11 0030 07/31/11 1705  NA 136 136  K 3.5 5.6*  CL 94* 98  CO2 36* 32  GLUCOSE 84 97  BUN 17 24*  CREATININE 0.73 1.62*    Basename 08/01/11 0802 08/01/11 0520  TROPONINI 0.41* 0.58*   Hepatic Function Panel  Basename 08/02/11 0424  PROT 6.6  ALBUMIN 2.8*  AST 106*  ALT 156*  ALKPHOS 138*  BILITOT 0.2*  BILIDIR <0.1  IBILI NOT CALCULATED   No results found for this basename: CHOL in the last 72 hours No results found for this basename: PROTIME in the last 72 hours  Imaging: Myoview done, results pend  Cardiac Studies: 2D echo 08/01/11 NL LVF with grade 1 diastolic dysfuntion  Assessment/Plan:  NSTEMI pk troponin 0.64 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 Will stop Crestor with elevated LFTs. MD to see.   LOS: 3 days    KILROY,LUKE K PA-C 08/03/2011, 11:06 AM    Agree with note written by Corine Shelter [facility]  Nanetta Batty J 08/03/2011 5:22 PM  Pt has nl LV FXN by 2D echo and a nonischemic myoview. Doubt Sx are  cardiac. BNP is decreasing.. Will keep on IV lasix today and change to PO tomorrow. Labs otherwise ok.

## 2011-08-03 NOTE — Progress Notes (Signed)
Subjective: No new complaints.  Objective: Vital signs in last 24 hours: Temp:  [98.1 F (36.7 C)-98.6 F (37 C)] 98.1 F (36.7 C) (11/06 1300) Pulse Rate:  [79-105] 84  (11/06 1300) Resp:  [16] 16  (11/06 0652) BP: (119-153)/(67-76) 122/76 mmHg (11/06 1300) SpO2:  [92 %-98 %] 98 % (11/06 1330) Weight change:  Last BM Date: 08/01/11  Intake/Output from previous day: 11/05 0701 - 11/06 0700 In: 919.8 [P.O.:680; I.V.:239.8] Out: 4195 [Urine:4195] Total I/O In: 550 [P.O.:480; IV Piggyback:70] Out: 650 [Urine:650]   Physical Exam: General: Comfortable, alert, communicative, fully oriented, not short of breath at rest.  HEENT: No clinical pallor, no jaundice, no conjunctival injection or discharge.  NECK: Supple, JVP not seen, no carotid bruits, no palpable lymphadenopathy, no palpable goiter.  CHEST: Clinically clear to auscultation, occasional expiratory rhonchi, no crackles.  HEART: Sounds 1 and 2 heard, normal, regular, no murmurs.  ABDOMEN: Full, soft, non-tender, no palpable organomegaly, no palpable masses, normal bowel sounds.  LOWER EXTREMITIES: No pitting edema, palpable peripheral pulses.  MUSCULOSKELETAL SYSTEM: Generalized osteoarthritic changes, otherwise, normal.  CENTRAL NERVOUS SYSTEM: No focal neurologic deficit on gross examination.  Lab Results:  Basename 08/02/11 0424 08/01/11 0146  WBC 7.5 8.2  HGB 12.6 12.9  HCT 43.0 44.3  PLT 223 242    Basename 08/03/11 0030  NA 136  K 3.5  CL 94*  CO2 36*  GLUCOSE 84  BUN 17  CREATININE 0.73  CALCIUM 9.5   Recent Results (from the past 240 hour(s))  URINE CULTURE     Status: Normal   Collection Time   07/31/11  5:42 PM      Component Value Range Status Comment   Specimen Description URINE, CLEAN CATCH   Final    Special Requests NONE   Final    Setup Time [id number]   Final    Colony Count 20,OOO COLONIES/ML   Final    Culture INSIGNIFICANT GROWTH   Final    Report Status 08/02/2011 FINAL   Final       Studies/Results: Nm Myocar Multi W/spect W/wall Motion / Ef  08/03/2011  *RADIOLOGY REPORT*  Clinical Data:  Coronary artery disease.  COPD.  Abnormal troponin levels.  MYOCARDIAL IMAGING WITH SPECT (REST AND PHARMACOLOGIC-STRESS) GATED LEFT VENTRICULAR WALL MOTION STUDY LEFT VENTRICULAR EJECTION FRACTION  Technique:  Standard myocardial SPECT imaging was performed after resting intravenous injection of 10 mCi Tc-27m tetrofosmin. Subsequently, intravenous infusion of Lexiscan was performed under the supervision of the Cardiology staff.  At peak effect of the drug, 30 mCi Tc-51m tetrofosmin was injected intravenously and standard myocardial SPECT  imaging was performed.  Quantitative gated imaging was also performed to evaluate left ventricular wall motion, and estimate left ventricular ejection fraction.  Comparison:  None.  Findings:  Spect:  Attenuation at the apex is fixed.  The area thickens in systole and fills with activity therefore this is probably artifactual attenuation.  Wall motion:  Normal motion  Ejection fraction:  66%.  End diastolic bone 79 ml.  End-systolic 27 ml.  IMPRESSION: Allowing for attenuation artifact at the apex, no perfusion defects are seen.  Ejection fraction is within normal limits.  Original Report Authenticated By: Donavan Burnet, M.D.    Medications: Scheduled Meds:   . albuterol  2.5 mg Nebulization Q6H  . aspirin EC  325 mg Oral Daily  . budesonide  0.5 mg Nebulization BID  . calcium carbonate  1 tablet Oral Daily  . enalapril  5  mg Oral Daily  . enoxaparin (LOVENOX) injection  40 mg Subcutaneous Q24H  . furosemide  20 mg Intravenous BID  . ipratropium  0.5 mg Nebulization Q6H  . lactulose  20 g Oral Daily  . multivitamins ther. w/minerals  1 tablet Oral Daily  . nortriptyline  25 mg Oral QHS  . omega-3 acid ethyl esters  1 g Oral Daily  . pantoprazole  40 mg Oral Q1200  . polyethylene glycol  17 g Oral Daily  . predniSONE  40 mg Oral QAC breakfast    . regadenoson  0.4 mg Intravenous Once  . sodium chloride  3 mL Intravenous Q12H  . DISCONTD: albuterol  2.5 mg Nebulization Q6H  . DISCONTD: enoxaparin (LOVENOX) injection  30 mg Subcutaneous Q24H  . DISCONTD: rosuvastatin  40 mg Oral q1800   Continuous Infusions:   . sodium chloride 10 mL/hr at 08/02/11 0600   PRN Meds:.acetaminophen, albuterol, butalbital-acetaminophen-caffeine, ondansetron, ondansetron, technetium tetrofosmin, technetium tetrofosmin, DISCONTD: albuterol  Assessment/Plan: Principal Problem:  *Altered mental status: Resolved. Now back to baseline mental status. Etiology was probably hypoxemia, secondary to CHF and COPD.  Active Problems:  1. CHF (congestive heart failure). Clinically compensated at this time. Continue current dose of Lasix.  2. COPD (chronic obstructive pulmonary disease). Stable No obvious exacerbation at this time.  3. Non-STEMI (non-ST elevated myocardial infarction): Patient had mildly elevated cardiac enzymes at presentation. Per cardiologist, this is likely due to demand ischemia. Myoview 08/03/2011 was a negative study Mild aortic stenosis: Stable.  4. CAD (coronary artery disease)  5. Hyperlipidemia: Statin discontinued, due to transaminitis. 6. Tobacco abuse: counseled.  7. Abnormal LFTs: Due to congestive hepatopathy.  8. Constipation: Laxatives given.    Disposition: Aim discharge on 08/04/2011.   LOS: 3 days   Mete Purdum,CHRISTOPHER 08/03/2011, 6:15 PM

## 2011-08-04 ENCOUNTER — Encounter (HOSPITAL_COMMUNITY): Payer: Medicare Other

## 2011-08-04 ENCOUNTER — Ambulatory Visit (HOSPITAL_COMMUNITY): Payer: Medicare Other

## 2011-08-04 LAB — BASIC METABOLIC PANEL
BUN: 18 mg/dL (ref 6–23)
CO2: 36 mEq/L — ABNORMAL HIGH (ref 19–32)
Calcium: 9.9 mg/dL (ref 8.4–10.5)
Chloride: 93 mEq/L — ABNORMAL LOW (ref 96–112)
Creatinine, Ser: 0.76 mg/dL (ref 0.50–1.10)
GFR calc Af Amer: >90 mL/min (ref >90)
GFR calc non Af Amer: 87 mL/min — ABNORMAL LOW (ref >90)
Glucose, Bld: 89 mg/dL (ref 70–99)
Potassium: 3.7 mEq/L (ref 3.5–5.1)
Sodium: 133 mEq/L — ABNORMAL LOW (ref 135–145)

## 2011-08-04 LAB — PRO B NATRIURETIC PEPTIDE: Pro B Natriuretic peptide (BNP): 909.7 pg/mL — ABNORMAL HIGH (ref 0–125)

## 2011-08-04 MED ORDER — PREDNISONE 20 MG PO TABS: 10.0000 mg | ORAL_TABLET | Freq: Every day | ORAL | Status: AC

## 2011-08-04 MED ORDER — FUROSEMIDE 40 MG PO TABS: 40.0000 mg | ORAL_TABLET | Freq: Every day | ORAL | Status: DC

## 2011-08-04 MED ORDER — OMEGA-3-ACID ETHYL ESTERS 1 G PO CAPS: 1.0000 g | ORAL_CAPSULE | Freq: Every day | ORAL | Status: DC

## 2011-08-04 NOTE — Discharge Summary (Signed)
Physician Discharge Summary  Patient ID: [name] MRN: [id number] DOB/AGE: [birth year] 65 y.o.  Admit date: 07/31/2011 Discharge date: 08/04/2011  Primary Care Physician:  No primary provider on file. Dr. Jason Nest.  Discharge Diagnoses:    Patient Active Problem List  Diagnoses  . Altered mental status  . CHF (congestive heart failure)  . COPD (chronic obstructive pulmonary disease)  . Hypercapnemia  . Non-STEMI (non-ST elevated myocardial infarction)  . Mild aortic stenosis  . CAD (coronary artery disease)  . Hyperlipidemia  . Tobacco abuse  . S/P cholecystectomy  . NSVT (nonsustained ventricular tachycardia)  . Ascites  . Abnormal LFTs    Current Discharge Medication List    START taking these medications   Details  furosemide (LASIX) 40 MG tablet Take 1 tablet (40 mg total) by mouth daily. Qty: 30 tablet, Refills: 0    omega-3 acid ethyl esters (LOVAZA) 1 G capsule Take 1 capsule (1 g total) by mouth daily. Qty: 30 capsule, Refills: 0      CONTINUE these medications which have CHANGED   Details  predniSONE (DELTASONE) 20 MG tablet Take 0.5 tablets (10 mg total) by mouth daily before breakfast. Qty: 18 tablet, Refills: 0      CONTINUE these medications which have NOT CHANGED   Details  albuterol (PROVENTIL HFA;VENTOLIN HFA) 108 (90 BASE) MCG/ACT inhaler Inhale 2 puffs into the lungs every 4 (four) hours as needed. Shortness of breath and wheezing     ALPRAZolam (XANAX) 1 MG tablet Take 1 mg by mouth 3 (three) times daily as needed. For anxitey     anastrozole (ARIMIDEX) 1 MG tablet Take 1 mg by mouth daily.      aspirin 325 MG tablet Take 325 mg by mouth daily.      atorvastatin (LIPITOR) 80 MG tablet Take 80 mg by mouth at bedtime.      butalbital-acetaminophen-caffeine (FIORICET, ESGIC) 50-325-40 MG per tablet Take 1 tablet by mouth every 4 (four) hours as needed. For headaches     calcium carbonate (TUMS - DOSED IN MG ELEMENTAL CALCIUM) 500 MG  chewable tablet Chew 1 tablet by mouth daily.      enalapril (VASOTEC) 5 MG tablet Take 5 mg by mouth daily.      fish oil-omega-3 fatty acids 1000 MG capsule Take 1 g by mouth daily.      fluticasone (FLOVENT HFA) 220 MCG/ACT inhaler Inhale 2 puffs into the lungs 2 (two) times daily as needed. For shortness of breath     ipratropium (ATROVENT HFA) 17 MCG/ACT inhaler Inhale 2 puffs into the lungs every 4 (four) hours as needed. For shortness of breath     ipratropium-albuterol (DUONEB) 0.5-2.5 (3) MG/3ML SOLN Take 3 mLs by nebulization every 6 (six) hours as needed. For shortness of breath     lactulose (CHRONULAC) 10 GM/15ML solution Take 30 g by mouth as needed.      montelukast (SINGULAIR) 10 MG tablet Take 10 mg by mouth at bedtime.      Multiple Vitamin (MULTIVITAMIN) tablet Take 1 tablet by mouth daily.      nortriptyline (PAMELOR) 25 MG capsule Take 25 mg by mouth at bedtime.      omeprazole (PRILOSEC) 20 MG capsule Take 20 mg by mouth daily.      potassium chloride (KLOR-CON) 10 MEQ CR tablet Take 20 mEq by mouth daily.           Disposition and Follow-up:  Patient has been instructed to follow up with  her primary MD within 2 weeks of discharge, and with Dr Rennis Golden, cardiologist, within 3 weeks. Consults:  cardiology  Dr Rennis Golden, Cardiologist.  Significant Diagnostic Studies:  Dg Chest 2 View  07/31/2011  *RADIOLOGY REPORT*  Clinical Data: Left arm pain.  Hypertension.  CHEST - 2 VIEW  Comparison: 06/04/2011  Findings: The cardiopericardial silhouette is enlarged. There is pulmonary vascular congestion without overt pulmonary edema. Diffuse interstitial opacity suggests superimposed interstitial pulmonary edema.  No pleural effusion. Imaged bony structures of the thorax are intact.  IMPRESSION: Cardiomegaly with vascular congestion and probable interstitial pulmonary edema.  Original Report Authenticated By: ERIC A. MANSELL, M.D.   Ct Head Wo Contrast  07/31/2011  *RADIOLOGY  REPORT*  Clinical Data:  Headache.  CT HEAD WITHOUT CONTRAST  Technique: Contiguous axial images were obtained from the base of the skull through the vertex without contrast.  Comparison: None.  Findings: Normal appearing cerebral hemispheres and posterior fossa structures.  Normal size and position of the ventricles.  No intracranial hemorrhage, mass lesion or evidence of acute infarction.  Unremarkable bones and included portions of the paranasal sinuses.  IMPRESSION: Normal examination.  Original Report Authenticated By: Darrol Angel, M.D.   Dg Chest Portable 1 View  08/01/2011  *RADIOLOGY REPORT*  Clinical Data: CHF, asthma, elevated troponin  PORTABLE CHEST - 1 VIEW  Comparison: 08/01/2011  Findings: Heart is enlarged with central vascular congestion and diffuse interstitial prominence versus mild edema pattern.  Early CHF not excluded.  No interval change compared to earlier today. No enlarging effusion or pneumothorax.  Postop changes in the right axilla.  Trachea is midline.  IMPRESSION: Cardiomegaly with mild interstitial edema pattern.  Stable.  Original Report Authenticated By: Judie Petit. Ruel Favors, M.D.   Dg Chest Portable 1 View  08/01/2011  *RADIOLOGY REPORT*  Clinical Data: COPD.  No current chest complaints.  PORTABLE CHEST  Comparison: None.  Findings: Enlarged cardiac silhouette.  Prominent pulmonary vasculature and interstitial markings.  Mild thoracic spine degenerative changes.  IMPRESSION: Cardiomegaly, pulmonary vascular congestion and mild interstitial lung disease.  Original Report Authenticated By: Darrol Angel, M.D.    Brief H and P: For complete details, refer to admission H and P. However,  in brief, this is a 65 year old,  Caucasian female, with known history of breast cancer, chronic obstructive pulmonary disease, coronary artery disease, and tobacco use, anxiety and depression. Her daughter called EMS because she felt that patient had taken more Xanax than was prescribed.  Paramedics found her pulse ox was in the 80%. She was put on oxygen, brought to the emergency  department and was admitted for further evaluation, investigation and management.    Physical Exam: On 08/04/2011. General: Comfortable, alert, communicative, fully oriented, not short of breath at rest.  HEENT: No clinical pallor, no jaundice, no conjunctival injection or discharge.  NECK: Supple, JVP not seen, no carotid bruits, no palpable lymphadenopathy, no palpable goiter.  CHEST: Clinically clear to auscultation, occasional expiratory rhonchi, no crackles.  HEART: Sounds 1 and 2 heard, normal, regular, no murmurs.  ABDOMEN: Full, soft, non-tender, no palpable organomegaly, no palpable masses, normal bowel sounds.  LOWER EXTREMITIES: No pitting edema, palpable peripheral pulses.  MUSCULOSKELETAL SYSTEM: Generalized osteoarthritic changes, otherwise, normal.  CENTRAL NERVOUS SYSTEM: No focal neurologic deficit on gross examination.      Hospital Course:  Principal Problem:  *Altered mental status: This was described as lethargy, and was the reason for initial presentation. Mental status is now back to baseline. Etiology was probably hypoxemia,  secondary to CHF and COPD.  Active Problems:  1. CHF (congestive heart failure). Patient's BNP was markedly elevated at 128[address] at presentation. With judicious use of intravenous Lasix, CHF improved and BNP was 909.7 as of 08/02/2011. Patient has been transitioned to oral lasix and as of 08/04/11, is clinically compensated.  2. COPD (chronic obstructive pulmonary disease). There was no obvious exacerbation during this hospitalization. Patient was managed with bronchodilators and a tapering course of steroids. 3. Query Non-STEMI (non-ST elevated myocardial infarction): Patient had mildly elevated cardiac enzymes at presentation. Per cardiologist, this is likely due to demand ischemia. Myoview done on 08/03/2011, was a negative study, with no perfusion defects  and normal wall motion.  4. CAD (coronary artery disease)  5. Hyperlipidemia: Statin was discontinued, due to transaminitis..  6. Tobacco abuse: Counseled appropriately. 7. Abnormal LFTs: Due to congestive hepatopathy.    Comment: Patient was asymptomatic on 08/04/11, and was considered clinically stable for discharge. Time spent on Discharge: 1 Hour.  Signed: Marua Qin,CHRISTOPHER 08/04/2011, 2:47 PM

## 2011-08-04 NOTE — Plan of Care (Signed)
Problem: Phase I Progression Outcomes Goal: EF % per last Echo/documented,Core Reminder form on chart Outcome: Completed/Met Date Met:  08/04/11 Myoview completed on 08/03/11 EF charted in results

## 2011-08-04 NOTE — Plan of Care (Signed)
Problem: Phase I Progression Outcomes Goal: Initial discharge plan identified Outcome: Completed/Met Date Met:  08/04/11 Plans to discharge home 08/04/11

## 2011-08-05 ENCOUNTER — Other Ambulatory Visit: Payer: Self-pay | Admitting: Oncology

## 2011-08-05 ENCOUNTER — Telehealth: Payer: Self-pay | Admitting: Oncology

## 2011-08-05 ENCOUNTER — Encounter (HOSPITAL_COMMUNITY): Payer: Medicare Other

## 2011-08-05 ENCOUNTER — Ambulatory Visit (HOSPITAL_COMMUNITY): Payer: Medicare Other

## 2011-08-05 DIAGNOSIS — C50919 Malignant neoplasm of unspecified site of unspecified female breast: Secondary | ICD-10-CM

## 2011-08-05 NOTE — Telephone Encounter (Signed)
Per pof 10/31 s/w MD about pt and informed him on 11/05 that the pt is in the hospital does he still want her to have a mri appt and he stated on 11/08 not to scheduled it.  Scheduled pt for bone density on 10/30/2011 @ 10am [facility].  Mailed pts appt for [id number] along with her bone density appt

## 2011-08-05 NOTE — Telephone Encounter (Signed)
Faxed over a copy of the pts demographics and insurance info to the [facility] for appt

## 2011-08-08 ENCOUNTER — Encounter: Payer: Self-pay | Admitting: *Deleted

## 2011-08-08 NOTE — Progress Notes (Signed)
Mailed after appt letter to pt. 

## 2011-08-16 ENCOUNTER — Ambulatory Visit: Payer: Medicare Other | Attending: Oncology | Admitting: Physical Therapy

## 2011-08-16 DIAGNOSIS — IMO0001 Reserved for inherently not codable concepts without codable children: Secondary | ICD-10-CM | POA: Insufficient documentation

## 2011-08-16 DIAGNOSIS — I890 Lymphedema, not elsewhere classified: Secondary | ICD-10-CM | POA: Insufficient documentation

## 2011-08-30 ENCOUNTER — Ambulatory Visit: Payer: Medicare Other | Attending: Oncology | Admitting: Physical Therapy

## 2011-08-30 DIAGNOSIS — I890 Lymphedema, not elsewhere classified: Secondary | ICD-10-CM | POA: Insufficient documentation

## 2011-08-30 DIAGNOSIS — IMO0001 Reserved for inherently not codable concepts without codable children: Secondary | ICD-10-CM | POA: Insufficient documentation

## 2011-09-06 ENCOUNTER — Ambulatory Visit: Payer: Medicare Other | Admitting: Physical Therapy

## 2011-09-22 ENCOUNTER — Telehealth: Payer: Self-pay | Admitting: *Deleted

## 2011-09-22 NOTE — Telephone Encounter (Signed)
patient confirmed over the phone the new date and time on 11-11-2011 starting at 11:30am

## 2011-11-01 ENCOUNTER — Ambulatory Visit
Admission: RE | Admit: 2011-11-01 | Discharge: 2011-11-01 | Disposition: A | Discharge status: Home / Self Care | Payer: Medicare Other | Source: Ambulatory Visit | Attending: Oncology | Admitting: Oncology

## 2011-11-01 DIAGNOSIS — C50919 Malignant neoplasm of unspecified site of unspecified female breast: Secondary | ICD-10-CM

## 2011-11-05 ENCOUNTER — Other Ambulatory Visit: Payer: Medicare Other | Admitting: Lab

## 2011-11-05 ENCOUNTER — Ambulatory Visit: Payer: Medicare Other | Admitting: Oncology

## 2011-11-10 ENCOUNTER — Encounter (HOSPITAL_COMMUNITY): Payer: Self-pay | Admitting: Family Medicine

## 2011-11-10 ENCOUNTER — Inpatient Hospital Stay (HOSPITAL_COMMUNITY)
Admission: EM | Admit: 2011-11-10 | Discharge: 2011-11-15 | DRG: 189 | Disposition: A | Discharge status: Home Health | Payer: Medicare Other | Attending: Internal Medicine | Admitting: Internal Medicine

## 2011-11-10 ENCOUNTER — Other Ambulatory Visit: Payer: Self-pay

## 2011-11-10 ENCOUNTER — Emergency Department (HOSPITAL_COMMUNITY): Payer: Medicare Other

## 2011-11-10 DIAGNOSIS — R791 Abnormal coagulation profile: Secondary | ICD-10-CM | POA: Diagnosis present

## 2011-11-10 DIAGNOSIS — N179 Acute kidney failure, unspecified: Secondary | ICD-10-CM | POA: Diagnosis present

## 2011-11-10 DIAGNOSIS — F411 Generalized anxiety disorder: Secondary | ICD-10-CM | POA: Diagnosis present

## 2011-11-10 DIAGNOSIS — M7989 Other specified soft tissue disorders: Secondary | ICD-10-CM | POA: Diagnosis present

## 2011-11-10 DIAGNOSIS — J441 Chronic obstructive pulmonary disease with (acute) exacerbation: Secondary | ICD-10-CM | POA: Diagnosis present

## 2011-11-10 DIAGNOSIS — F19988 Other psychoactive substance use, unspecified with other psychoactive substance-induced disorder: Secondary | ICD-10-CM | POA: Diagnosis not present

## 2011-11-10 DIAGNOSIS — E872 Acidosis, unspecified: Secondary | ICD-10-CM | POA: Diagnosis present

## 2011-11-10 DIAGNOSIS — Z9861 Coronary angioplasty status: Secondary | ICD-10-CM

## 2011-11-10 DIAGNOSIS — Z79899 Other long term (current) drug therapy: Secondary | ICD-10-CM

## 2011-11-10 DIAGNOSIS — R0602 Shortness of breath: Secondary | ICD-10-CM | POA: Diagnosis present

## 2011-11-10 DIAGNOSIS — I359 Nonrheumatic aortic valve disorder, unspecified: Secondary | ICD-10-CM | POA: Diagnosis present

## 2011-11-10 DIAGNOSIS — I350 Nonrheumatic aortic (valve) stenosis: Secondary | ICD-10-CM | POA: Diagnosis present

## 2011-11-10 DIAGNOSIS — I10 Essential (primary) hypertension: Secondary | ICD-10-CM | POA: Diagnosis present

## 2011-11-10 DIAGNOSIS — R232 Flushing: Secondary | ICD-10-CM | POA: Diagnosis not present

## 2011-11-10 DIAGNOSIS — E860 Dehydration: Secondary | ICD-10-CM | POA: Diagnosis present

## 2011-11-10 DIAGNOSIS — I509 Heart failure, unspecified: Secondary | ICD-10-CM | POA: Diagnosis present

## 2011-11-10 DIAGNOSIS — I4729 Other ventricular tachycardia: Secondary | ICD-10-CM | POA: Diagnosis not present

## 2011-11-10 DIAGNOSIS — E785 Hyperlipidemia, unspecified: Secondary | ICD-10-CM | POA: Diagnosis present

## 2011-11-10 DIAGNOSIS — IMO0002 Reserved for concepts with insufficient information to code with codable children: Secondary | ICD-10-CM | POA: Diagnosis not present

## 2011-11-10 DIAGNOSIS — I5033 Acute on chronic diastolic (congestive) heart failure: Secondary | ICD-10-CM | POA: Diagnosis present

## 2011-11-10 DIAGNOSIS — Z853 Personal history of malignant neoplasm of breast: Secondary | ICD-10-CM

## 2011-11-10 DIAGNOSIS — F17200 Nicotine dependence, unspecified, uncomplicated: Secondary | ICD-10-CM | POA: Diagnosis present

## 2011-11-10 DIAGNOSIS — J9620 Acute and chronic respiratory failure, unspecified whether with hypoxia or hypercapnia: Principal | ICD-10-CM | POA: Diagnosis present

## 2011-11-10 DIAGNOSIS — I272 Pulmonary hypertension, unspecified: Secondary | ICD-10-CM | POA: Diagnosis present

## 2011-11-10 DIAGNOSIS — I2789 Other specified pulmonary heart diseases: Secondary | ICD-10-CM | POA: Diagnosis present

## 2011-11-10 DIAGNOSIS — E875 Hyperkalemia: Secondary | ICD-10-CM | POA: Diagnosis present

## 2011-11-10 DIAGNOSIS — I472 Ventricular tachycardia, unspecified: Secondary | ICD-10-CM | POA: Diagnosis not present

## 2011-11-10 DIAGNOSIS — I252 Old myocardial infarction: Secondary | ICD-10-CM

## 2011-11-10 DIAGNOSIS — I2510 Atherosclerotic heart disease of native coronary artery without angina pectoris: Secondary | ICD-10-CM | POA: Diagnosis present

## 2011-11-10 DIAGNOSIS — G43909 Migraine, unspecified, not intractable, without status migrainosus: Secondary | ICD-10-CM | POA: Diagnosis present

## 2011-11-10 DIAGNOSIS — Y9210 Unspecified residential institution as the place of occurrence of the external cause: Secondary | ICD-10-CM | POA: Diagnosis not present

## 2011-11-10 DIAGNOSIS — J45901 Unspecified asthma with (acute) exacerbation: Secondary | ICD-10-CM | POA: Diagnosis present

## 2011-11-10 LAB — BASIC METABOLIC PANEL
BUN: 35 mg/dL — ABNORMAL HIGH (ref 6–23)
CO2: 31 mEq/L (ref 19–32)
Calcium: 9.5 mg/dL (ref 8.4–10.5)
Chloride: 95 mEq/L — ABNORMAL LOW (ref 96–112)
Creatinine, Ser: 1.84 mg/dL — ABNORMAL HIGH (ref 0.50–1.10)
GFR calc Af Amer: 32 mL/min — ABNORMAL LOW (ref >90)
GFR calc non Af Amer: 28 mL/min — ABNORMAL LOW (ref >90)
Glucose, Bld: 96 mg/dL (ref 70–99)
Potassium: 6.3 mEq/L (ref 3.5–5.1)
Sodium: 136 mEq/L (ref 135–145)

## 2011-11-10 LAB — BLOOD GAS, ARTERIAL
Acid-Base Excess: 1.5 mmol/L (ref 0.0–2.0)
Bicarbonate: 31 mEq/L — ABNORMAL HIGH (ref 20.0–24.0)
Drawn by: 295031
FIO2: 1 %
O2 Saturation: 99.6 %
Patient temperature: 98.6
TCO2: 29.3 mmol/L (ref 0–100)
pCO2 arterial: 79.3 mmHg (ref 35.0–45.0)
pH, Arterial: 7.216 — ABNORMAL LOW (ref 7.350–7.400)
pO2, Arterial: 307 mmHg — ABNORMAL HIGH (ref 80.0–100.0)

## 2011-11-10 LAB — CARDIAC PANEL(CRET KIN+CKTOT+MB+TROPI)
CK, MB: 5.5 ng/mL — ABNORMAL HIGH (ref 0.3–4.0)
CK, MB: 6.1 ng/mL (ref 0.3–4.0)
Relative Index: INVALID (ref 0.0–2.5)
Relative Index: INVALID (ref 0.0–2.5)
Total CK: 87 U/L (ref 7–177)
Total CK: 79 U/L (ref 7–177)
Troponin I: <0.3 ng/mL (ref <0.30)
Troponin I: 0.41 ng/mL (ref <0.30)

## 2011-11-10 LAB — DIFFERENTIAL
Basophils Absolute: 0.1 10*3/uL (ref 0.0–0.1)
Basophils Relative: 1 % (ref 0–1)
Eosinophils Absolute: 0 10*3/uL (ref 0.0–0.7)
Eosinophils Relative: 0 % (ref 0–5)
Lymphocytes Relative: 6 % — ABNORMAL LOW (ref 12–46)
Lymphs Abs: 0.5 10*3/uL — ABNORMAL LOW (ref 0.7–4.0)
Monocytes Absolute: 0.6 10*3/uL (ref 0.1–1.0)
Monocytes Relative: 8 % (ref 3–12)
Neutro Abs: 6.9 10*3/uL (ref 1.7–7.7)
Neutrophils Relative %: 86 % — ABNORMAL HIGH (ref 43–77)

## 2011-11-10 LAB — CBC
HCT: 41.9 % (ref 36.0–46.0)
Hemoglobin: 12.6 g/dL (ref 12.0–15.0)
MCH: 25.9 pg — ABNORMAL LOW (ref 26.0–34.0)
MCHC: 30.1 g/dL (ref 30.0–36.0)
MCV: 86.2 fL (ref 78.0–100.0)
Platelets: 312 10*3/uL (ref 150–400)
RBC: 4.86 MIL/uL (ref 3.87–5.11)
RDW: 18.7 % — ABNORMAL HIGH (ref 11.5–15.5)
WBC: 8 10*3/uL (ref 4.0–10.5)

## 2011-11-10 LAB — D-DIMER, QUANTITATIVE: D-Dimer, Quant: 0.86 ug/mL-FEU — ABNORMAL HIGH (ref 0.00–0.48)

## 2011-11-10 LAB — POTASSIUM: Potassium: 6.2 mEq/L — ABNORMAL HIGH (ref 3.5–5.1)

## 2011-11-10 MED ORDER — OMEGA-3-ACID ETHYL ESTERS 1 G PO CAPS
1.0000 g | ORAL_CAPSULE | Freq: Every day | ORAL | Status: DC
  Administered 2011-11-10 – 2011-11-15 (×6): 1 g via ORAL
  Filled 2011-11-10 (×6): qty 1

## 2011-11-10 MED ORDER — INSULIN REGULAR HUMAN 100 UNIT/ML IJ SOLN: 10.0000 [IU] | Freq: Once | INTRAMUSCULAR | Status: DC

## 2011-11-10 MED ORDER — PANTOPRAZOLE SODIUM 40 MG PO TBEC
40.0000 mg | DELAYED_RELEASE_TABLET | Freq: Every day | ORAL | Status: DC
  Administered 2011-11-10 – 2011-11-15 (×6): 40 mg via ORAL
  Filled 2011-11-10 (×6): qty 1

## 2011-11-10 MED ORDER — ALBUTEROL SULFATE (5 MG/ML) 0.5% IN NEBU
INHALATION_SOLUTION | RESPIRATORY_TRACT | Status: AC
  Filled 2011-11-10: qty 2

## 2011-11-10 MED ORDER — ANASTROZOLE 1 MG PO TABS
1.0000 mg | ORAL_TABLET | Freq: Every day | ORAL | Status: DC
  Administered 2011-11-10 – 2011-11-15 (×6): 1 mg via ORAL
  Filled 2011-11-10 (×6): qty 1

## 2011-11-10 MED ORDER — CALCIUM CARBONATE ANTACID 500 MG PO CHEW
1.0000 | CHEWABLE_TABLET | Freq: Every day | ORAL | Status: DC
  Administered 2011-11-10 – 2011-11-15 (×5): 200 mg via ORAL
  Filled 2011-11-10 (×6): qty 1

## 2011-11-10 MED ORDER — ADULT MULTIVITAMIN W/MINERALS CH
1.0000 | ORAL_TABLET | Freq: Every day | ORAL | Status: DC
  Administered 2011-11-10 – 2011-11-15 (×6): 1 via ORAL
  Filled 2011-11-10 (×6): qty 1

## 2011-11-10 MED ORDER — DEXTROSE 50 % IV SOLN
50.0000 mL | Freq: Once | INTRAVENOUS | Status: AC
  Administered 2011-11-10: 50 mL via INTRAVENOUS
  Filled 2011-11-10: qty 50

## 2011-11-10 MED ORDER — BUTALBITAL-APAP-CAFFEINE 50-325-40 MG PO TABS
1.0000 | ORAL_TABLET | ORAL | Status: DC | PRN
  Administered 2011-11-11 – 2011-11-15 (×8): 1 via ORAL
  Filled 2011-11-10 (×9): qty 1

## 2011-11-10 MED ORDER — ALBUTEROL SULFATE (5 MG/ML) 0.5% IN NEBU
2.5000 mg | INHALATION_SOLUTION | Freq: Four times a day (QID) | RESPIRATORY_TRACT | Status: DC
  Administered 2011-11-10 – 2011-11-11 (×3): 2.5 mg via RESPIRATORY_TRACT
  Filled 2011-11-10 (×3): qty 0.5

## 2011-11-10 MED ORDER — INSULIN ASPART 100 UNIT/ML ~~LOC~~ SOLN
SUBCUTANEOUS | Status: AC
  Filled 2011-11-10: qty 1

## 2011-11-10 MED ORDER — AMOXICILLIN-POT CLAVULANATE 500-125 MG PO TABS
1.0000 | ORAL_TABLET | Freq: Three times a day (TID) | ORAL | Status: DC
  Administered 2011-11-10 – 2011-11-11 (×2): 500 mg via ORAL
  Filled 2011-11-10 (×4): qty 1

## 2011-11-10 MED ORDER — METHYLPREDNISOLONE SODIUM SUCC 125 MG IJ SOLR
125.0000 mg | Freq: Once | INTRAMUSCULAR | Status: AC
  Administered 2011-11-10: 125 mg via INTRAVENOUS
  Filled 2011-11-10: qty 2

## 2011-11-10 MED ORDER — SIMVASTATIN 20 MG PO TABS
20.0000 mg | ORAL_TABLET | Freq: Every day | ORAL | Status: DC
  Administered 2011-11-10 – 2011-11-15 (×6): 20 mg via ORAL
  Filled 2011-11-10 (×6): qty 1

## 2011-11-10 MED ORDER — ENOXAPARIN SODIUM 60 MG/0.6ML ~~LOC~~ SOLN
60.0000 mg | SUBCUTANEOUS | Status: DC
  Administered 2011-11-10 – 2011-11-11 (×2): 60 mg via SUBCUTANEOUS
  Filled 2011-11-10 (×3): qty 0.6

## 2011-11-10 MED ORDER — METHYLPREDNISOLONE SODIUM SUCC 125 MG IJ SOLR: 80.0000 mg | Freq: Four times a day (QID) | INTRAMUSCULAR | Status: DC

## 2011-11-10 MED ORDER — SODIUM CHLORIDE 0.9 % IV BOLUS (SEPSIS)
500.0000 mL | Freq: Once | INTRAVENOUS | Status: AC
  Administered 2011-11-10: 500 mL via INTRAVENOUS

## 2011-11-10 MED ORDER — MONTELUKAST SODIUM 10 MG PO TABS
10.0000 mg | ORAL_TABLET | Freq: Every day | ORAL | Status: DC
  Administered 2011-11-10 – 2011-11-14 (×4): 10 mg via ORAL
  Filled 2011-11-10 (×6): qty 1

## 2011-11-10 MED ORDER — MULTI-VITAMIN/MINERALS PO TABS: 1.0000 | ORAL_TABLET | Freq: Every day | ORAL | Status: DC

## 2011-11-10 MED ORDER — INSULIN ASPART 100 UNIT/ML ~~LOC~~ SOLN: 10.0000 [IU] | Freq: Once | SUBCUTANEOUS | Status: DC

## 2011-11-10 MED ORDER — ALPRAZOLAM 1 MG PO TABS
1.0000 mg | ORAL_TABLET | Freq: Three times a day (TID) | ORAL | Status: DC | PRN
  Administered 2011-11-10 – 2011-11-15 (×8): 1 mg via ORAL
  Filled 2011-11-10 (×5): qty 1
  Filled 2011-11-10: qty 2
  Filled 2011-11-10 (×2): qty 1

## 2011-11-10 MED ORDER — INSULIN REGULAR HUMAN 100 UNIT/ML IJ SOLN
10.0000 [IU] | Freq: Once | INTRAMUSCULAR | Status: AC
  Administered 2011-11-10: 10 [IU] via INTRAVENOUS
  Filled 2011-11-10: qty 0.1

## 2011-11-10 MED ORDER — SODIUM BICARBONATE 8.4 % IV SOLN
50.0000 meq | Freq: Once | INTRAVENOUS | Status: AC
  Administered 2011-11-10: 50 meq via INTRAVENOUS
  Filled 2011-11-10: qty 50

## 2011-11-10 MED ORDER — PANTOPRAZOLE SODIUM 40 MG PO TBEC: 40.0000 mg | DELAYED_RELEASE_TABLET | Freq: Every day | ORAL | Status: DC

## 2011-11-10 MED ORDER — NORTRIPTYLINE HCL 25 MG PO CAPS
25.0000 mg | ORAL_CAPSULE | Freq: Every day | ORAL | Status: DC
  Administered 2011-11-10 – 2011-11-14 (×4): 25 mg via ORAL
  Filled 2011-11-10 (×7): qty 1

## 2011-11-10 MED ORDER — ALBUTEROL (5 MG/ML) CONTINUOUS INHALATION SOLN
10.0000 mg/h | INHALATION_SOLUTION | Freq: Once | RESPIRATORY_TRACT | Status: AC
  Administered 2011-11-10: 10 mg/h via RESPIRATORY_TRACT

## 2011-11-10 MED ORDER — FUROSEMIDE 10 MG/ML IJ SOLN
20.0000 mg | Freq: Once | INTRAMUSCULAR | Status: AC
  Administered 2011-11-10: 20 mg via INTRAVENOUS
  Filled 2011-11-10: qty 2

## 2011-11-10 MED ORDER — METHYLPREDNISOLONE SODIUM SUCC 125 MG IJ SOLR
80.0000 mg | Freq: Four times a day (QID) | INTRAMUSCULAR | Status: DC
  Administered 2011-11-10 – 2011-11-11 (×3): 80 mg via INTRAVENOUS
  Filled 2011-11-10 (×2): qty 2

## 2011-11-10 MED ORDER — IPRATROPIUM BROMIDE 0.02 % IN SOLN
0.5000 mg | Freq: Four times a day (QID) | RESPIRATORY_TRACT | Status: DC
  Administered 2011-11-10 – 2011-11-11 (×5): 0.5 mg via RESPIRATORY_TRACT
  Filled 2011-11-10 (×5): qty 2.5

## 2011-11-10 MED ORDER — SODIUM CHLORIDE 0.9 % IV SOLN
INTRAVENOUS | Status: DC
  Administered 2011-11-10 – 2011-11-11 (×4): via INTRAVENOUS

## 2011-11-10 NOTE — ED Notes (Signed)
ZOX:WRUE<AV> Expected date:11/10/11<BR> Expected time: 1:18 PM<BR> Means of arrival:Ambulance<BR> Comments:<BR> EMS 11 GC - sob

## 2011-11-10 NOTE — Progress Notes (Signed)
ANTICOAGULATION CONSULT NOTE - Initial Consult  Pharmacy Consult for Lovenox Indication: chest pain/ACS  Allergies  Allergen Reactions  . Ceftriaxone Other (See Comments)    "Blow up like a balloon"    Patient Measurements: Weight: 125 lb (56.7 kg)  Vital Signs: Temp: 98.6 F (37 C) (02/13 1936) Temp src: Oral (02/13 1936) BP: 111/62 mmHg (02/13 1936) Pulse Rate: 76  (02/13 1936)  Labs:  Basename 11/10/11 1920 11/10/11 1410 11/10/11 1400  HGB -- -- 12.6  HCT -- -- 41.9  PLT -- -- 312  APTT -- -- --  LABPROT -- -- --  INR -- -- --  HEPARINUNFRC -- -- --  CREATININE -- -- 1.84*  CKTOTAL 79 87 --  CKMB 6.1* 5.5* --  TROPONINI 0.41* <0.30 --   The CrCl is unknown because both a height and weight (above a minimum accepted value) are required for this calculation.  Medical History: Past Medical History  Diagnosis Date  . Angina   . Heart murmur   . Asthma   . Shortness of breath   . Cancer of breast unknown    RT Breast  . Depressed   . Coronary artery disease 2006    2 stents w/previous MI  . Hypertension   . CHF (congestive heart failure)   . Migraine   . Anxiety   . Myocardial infarction 2006  . COPD (chronic obstructive pulmonary disease)     Medications:  Scheduled:    . albuterol  2.5 mg Nebulization Q6H  . albuterol  10 mg/hr Nebulization Once  . dextrose  50 mL Intravenous Once  . furosemide  20 mg Intravenous Once  . insulin regular  10 Units Intravenous Once  . ipratropium  0.5 mg Nebulization Q6H  . methylPREDNISolone (SOLU-MEDROL) injection  125 mg Intravenous Once  . methylPREDNISolone (SOLU-MEDROL) injection  80 mg Intravenous Q6H  . sodium bicarbonate  50 mEq Intravenous Once  . sodium chloride  500 mL Intravenous Once  . sodium chloride  500 mL Intravenous Once  . DISCONTD: albuterol      . DISCONTD: insulin aspart  10 Units Subcutaneous Once  . DISCONTD: insulin regular  10 Units Subcutaneous Once  . DISCONTD: insulin regular  10  Units Subcutaneous Once  . DISCONTD: methylPREDNISolone (SOLU-MEDROL) injection  80 mg Intravenous Q6H    Assessment: 66 yo female admitted with CC SOB with hx CAD and CHF found to have possible ACS to start Lovenox per pharmacy. Patient with renal dysfunction - CrCl estimated to be 24 ml/min  Goal of Therapy:  heparin level 0.6-1.2   Plan:  For CrCl < 30 ml/min, start Lovenox 1mg /kg (60mg ) SQ q24. Will continue to follow renal function for any possible changes in dose  [name] 11/10/2011,8:37 PM

## 2011-11-10 NOTE — ED Notes (Signed)
Per EMS: Pt from home, lives with daughter. Reports having sob starting this morning. Upon arrival EMS states sats were 60% on RA increased to 100% on 12L with NRB mask.

## 2011-11-10 NOTE — ED Notes (Signed)
Phlebotomy at bedside for lab draw. 

## 2011-11-10 NOTE — H&P (Signed)
PCP:  No primary provider on file. Chief Complaint:  Shortness of breath and cough on a couple of days duration.  HPI:  Patient is a 66 year old Caucasian female with history of COPD, coronary artery disease and congestive heart failure-compensated presenting to the emergency room with shortness of breath a couple of days duration and this was said to have gotten progressively worse 48 hours prior to presenting to the emergency room. Shortness of breath is said to be associated with cough that is productive of yellowish sputum. Denies any associated chest pain. No palpitations. No nausea or vomiting. Patient denies any PND or orthopnea. She denies any fever, chills or Rigors. Symptoms are said to be getting progressively worse hence patient presented to the emergency room to be evaluated.  Review of Systems:  The patient denies anorexia, fever, weight loss,, vision loss, decreased hearing, hoarseness, chest pain, syncope, dyspnea on exertion, peripheral edema, balance deficits, cough and shortness of breath+++, abdominal pain, melena, hematochezia, severe indigestion/heartburn, hematuria, incontinence, genital sores, muscle weakness, suspicious skin lesions, transient blindness, difficulty walking, depression, unusual weight change, abnormal bleeding, enlarged lymph nodes, angioedema, and breast masses.  Past Medical History:  Past Medical History  Diagnosis Date  . Angina   . Heart murmur   . Asthma   . Shortness of breath   . Cancer of breast unknown    RT Breast  . Depressed   . Coronary artery disease 2006    2 stents w/previous MI  . Hypertension   . CHF (congestive heart failure)   . Migraine   . Anxiety   . Myocardial infarction 2006  . COPD (chronic obstructive pulmonary disease)     Past Surgical History  Procedure Date  . Breast lumpectomy     Right  . Cardiac catheterization 2006    Medications:  Prior to Admission medications   Medication Sig Start Date End  Date Taking? Authorizing Provider  albuterol (PROVENTIL HFA;VENTOLIN HFA) 108 (90 BASE) MCG/ACT inhaler Inhale 2 puffs into the lungs every 4 (four) hours as needed. Shortness of breath and wheezing    Yes Historical Provider, MD  ALPRAZolam Prudy Feeler) 1 MG tablet Take 1 mg by mouth 3 (three) times daily as needed. For anxitey    Yes Historical Provider, MD  anastrozole (ARIMIDEX) 1 MG tablet Take 1 mg by mouth daily.     Yes Historical Provider, MD  aspirin 325 MG tablet Take 325 mg by mouth daily.     Yes Historical Provider, MD  atorvastatin (LIPITOR) 80 MG tablet Take 80 mg by mouth at bedtime.     Yes Historical Provider, MD  butalbital-acetaminophen-caffeine (FIORICET, ESGIC) 50-325-40 MG per tablet Take 1 tablet by mouth every 4 (four) hours as needed. For headaches    Yes Historical Provider, MD  calcium carbonate (TUMS - DOSED IN MG ELEMENTAL CALCIUM) 500 MG chewable tablet Chew 1 tablet by mouth daily.     Yes Historical Provider, MD  diclofenac sodium (VOLTAREN) 1 % GEL Apply 1 application topically daily as needed. Hand pain   Yes Historical Provider, MD  enalapril (VASOTEC) 5 MG tablet Take 5 mg by mouth daily.     Yes Historical Provider, MD  fish oil-omega-3 fatty acids 1000 MG capsule Take 1 g by mouth daily.     Yes Historical Provider, MD  fluticasone (FLOVENT HFA) 220 MCG/ACT inhaler Inhale 2 puffs into the lungs 2 (two) times daily as needed. For shortness of breath    Yes Historical Provider, MD  furosemide (LASIX) 40 MG tablet Take 1 tablet (40 mg total) by mouth daily. 08/04/11 08/03/12 Yes Isidor Holts, MD  ipratropium (ATROVENT HFA) 17 MCG/ACT inhaler Inhale 2 puffs into the lungs every 4 (four) hours as needed. For shortness of breath    Yes Historical Provider, MD  ipratropium-albuterol (DUONEB) 0.5-2.5 (3) MG/3ML SOLN Take 3 mLs by nebulization every 6 (six) hours as needed. For shortness of breath    Yes Historical Provider, MD  lactulose (CHRONULAC) 10 GM/15ML solution Take  30 g by mouth as needed.     Yes Historical Provider, MD  montelukast (SINGULAIR) 10 MG tablet Take 10 mg by mouth at bedtime.     Yes Historical Provider, MD  Multiple Vitamin (MULTIVITAMIN) tablet Take 1 tablet by mouth daily.     Yes Historical Provider, MD  Multiple Vitamins-Minerals (MULTIVITAMIN WITH MINERALS) tablet Take 1 tablet by mouth daily.   Yes Historical Provider, MD  nortriptyline (PAMELOR) 25 MG capsule Take 25 mg by mouth at bedtime.     Yes Historical Provider, MD  omega-3 acid ethyl esters (LOVAZA) 1 G capsule Take 1 capsule (1 g total) by mouth daily. 08/04/11 08/03/12 Yes Isidor Holts, MD  omeprazole (PRILOSEC) 20 MG capsule Take 20 mg by mouth daily.     Yes Historical Provider, MD  pantoprazole (PROTONIX) 40 MG tablet Take 40 mg by mouth daily.   Yes Historical Provider, MD  potassium chloride (KLOR-CON) 10 MEQ CR tablet Take 20 mEq by mouth daily.     Yes Historical Provider, MD    Allergies:  Allergies  Allergen Reactions  . Ceftriaxone Other (See Comments)    "Blow up like a balloon" Levaquin     Social History:   reports that she has been smoking Cigarettes.  She has smoked for the past 47 years. She has never used smokeless tobacco. She reports that she drinks alcohol. She reports that she does not use illicit drugs.  Family History:  History reviewed. No pertinent family history.  Physical Exam:  Filed Vitals:   11/10/11 1609 11/10/11 1715 11/10/11 1808 11/10/11 1830  BP: 110/51 102/59 96/50 99/69   Pulse:  79 75   Temp:      TempSrc:      Resp: 19     SpO2: 100% 98% 96%       General: Alert and oriented times three, presently on O2 via nonrebreather mask, mildly dehydrated.  Eyes: PERRLA, pink conjunctiva, scleral anicterus  ENT: Dry oral mucosa, neck supple, no thyromegaly  Lungs: Inspiratory and expiratory rhonchi all over the lung fields.. No Rales  Cardiovascular: regular rate and rhythm, no regurgitation, no gallops, no murmurs. No  carotid bruits, no JVD  Abdomen: soft, positive BS, non-tender, non-distended, no organomegaly, not an acute abdomen  GU: not examined  Neuro: No lateralizing signs  Musculoskeletal: strength 5/5 all extremities, no clubbing, cyanosis or edema  Skin: Decreased turgor  Psych: appropriate affect  ?  Labs on Admission:   Basename 11/10/11 1730 11/10/11 1400  NA -- 136  K 6.2* 6.3*  CL -- 95*  CO2 -- 31  GLUCOSE -- 96  BUN -- 35*  CREATININE -- 1.84*  CALCIUM -- 9.5  MG -- --  PHOS -- --    No results found for this basename: AST:2,ALT:2,ALKPHOS:2,BILITOT:2,PROT:2,ALBUMIN:2 in the last 72 hours  No results found for this basename: LIPASE:2,AMYLASE:2 in the last 72 hours   Basename 11/10/11 1400  WBC 8.0  NEUTROABS 6.9  HGB 12.6  HCT  41.9  MCV 86.2  PLT 312     Basename 11/10/11 1410  CKTOTAL 87  CKMB 5.5*  CKMBINDEX --  TROPONINI <0.30    No results found for this basename: TSH,T4TOTAL,FREET3,T3FREE,THYROIDAB in the last 72 hours  No results found for this basename: VITAMINB12:2,FOLATE:2,FERRITIN:2,TIBC:2,IRON:2,RETICCTPCT:2 in the last 72 hours  Radiological Exams on Admission:  Dg Bone Density  11/01/2011  *RADIOLOGY REPORT*  Clinical Data: 66 year old postmenopausal female with history breast cancer  DUAL X-RAY ABSORPTIOMETRY (DXA) FOR BONE MINERAL DENSITY  AP LUMBAR SPINE (L1, L3)  Bone Mineral Density (BMD):            0.989 g/cm2 Young Adult T Score:                          -0.2 Z Score:                                                1.5  LEFT FEMUR NECK  Bone Mineral Density (BMD):             0.612 g/cm2 Young Adult T Score:                           -2.1 Z Score:                                                 -0.6  ASSESSMENT:  Patient's diagnostic category is LOW BONE MASS by WHO Criteria.  FRACTURE RISK: MODERATE  FRAX: Based on the World Health Organization FRAX model, the 10 year probability of a major osteoporotic fracture is 11%.  The 10 year  probability of a hip fracture is 1.8%.  Comparison: None  RECOMMENDATIONS:  Effective therapies are available in the form of bisphosphonates, selective estrogen receptor modulators, biologic agents, and hormone replacement therapy (for women).  All patients should ensure an adequate intake of dietary calcium (1200mg  daily) and vitamin D (800 IU daily) unless contraindicated.  All treatment decisions require clinical judgement and consideration of individual patient factors, including patient preferences, co-morbidities, previous drug use, risk factors not captured in the FRAX model (e.g., frailty, falls, vitamin D deficiency, increased bone turnover, interval significant decline in bone density) and possible under-or over-estimation of fracture risk by FRAX.  The National Osteoporosis Foundation recommends that FDA-approved medical therapies be considered in postmenopausal women and mean age 26 or older with a:        1)     Hip or vertebral (clinical or morphometric) fracture.           2)    T-score of -2.5 or lower at the spine or hip. 3)    Ten-year fracture probability by FRAX of 3% or greater for hip fracture or 20% or greater for major osteoporotic fracture. FOLLOW-UP:  People with diagnosed cases of osteoporosis or at high risk for fracture should have regular bone mineral density tests.  For patients eligible for Medicare, routine testing is allowed once every 2 years.  The testing frequency can be increased to one year for patients who have rapidly progressing disease, those who are receiving or discontinuing medical therapy to restore bone mass,  or have additional risk factors.  World Science writer [facility]) Criteria:  Normal: T scores from +1.0 to -1.0 Low Bone Mass (Osteopenia): T scores between -1.0 and -2.5 Osteoporosis: T scores -2.5 and below  Comparison to Reference Population:  T score is the key measure used in the diagnosis of osteoporosis and relative risk determination for fracture.  It  provides a value for bone mass relative to the mean bone mass of a young adult reference population expressed in terms of standard deviation (SD).  Z score is the age-matched score showing the patient's values compared to a population matched for age, sex, and race.  This is also expressed in terms of standard deviation.  The patient may have values that compare favorably to the age-matched values and still be at increased risk for fracture.  O) Criteria:  Normal: T scores from +1.0 to -1.0 Low Bone Mass (Osteopenia): T scores between -1.0 and -2.5 Osteoporosis: T scores -2.5 and below  Comparison to Reference Population:  T score is the key measure used in the diagnosis of osteoporosis and relative risk determination for fracture.  It provides a value for bone mass relative to the mean bone mass of a young adult reference population expressed in terms of standard deviation (SD).  Z score is the age-matched score showing the patient's values compared to a population matched for age, sex, and race.  This is also expressed in terms of standard deviation.  The patient may have values that compare favorably to the age-matched values and still be at increased risk for fracture.  Original Report Authenticated By: Littie Deeds. Judyann Munson, M.D.   Dg Chest Port 1 View  11/10/2011  *RADIOLOGY REPORT*  Clinical Data: Shortness of breath, asthma, hypertension.  PORTABLE CHEST - 1 VIEW  Comparison: 08/01/2011  Findings: Cardiomegaly with vascular congestion.  Diffuse interstitial prominence throughout the lungs, similar to prior study.  This could reflect mild interstitial edema or chronic interstitial lung disease.  Recommend clinical correlation.  No pleural effusions.  No acute bony abnormality.  IMPRESSION: Cardiomegaly with stable interstitial prominence, recurrent interstitial edema versus chronic lung disease.  Original Report Authenticated By: Cyndie Chime, M.D.    Assessment/Plan  Present on Admission:   Problems: #1  shortness of breath #2 cough-productive of yellowish sputum #3 dehydration.  Impression: #1 COPD exacerbation #2 dehydration #3 history of congestive heart failure-compensated #4 coronary artery disease status post stent #5 hyperlipidemia #6 hypertension #7 history of anxiety disorder #8 history of migraine headaches #9 history of breast CA.  Plan: #1 admit patient to telemetry #2 continue nebulizer treatment i.e. albuterol and Atrovent #3 oxygen supplementation i.e. O2 via nasal canulla #4 with start IV steroids and  add Augmentin #5 gentle IV hydration with normal saline #6 restart home meds. Will hold lasix because of borderline blood pressure #7 labs; CBC, CMP and magnesium repeated in a.m., we also ordered cardiac enzymes Q8 x3 Patient will be evaluated daily            Talmage Nap                     [phone]

## 2011-11-10 NOTE — ED Notes (Signed)
Kennon, RT at bedside.

## 2011-11-10 NOTE — ED Provider Notes (Addendum)
History     CSN: [id number]  Arrival date & time 11/10/11  1333   First MD Initiated Contact with Patient 11/10/11 1407      Chief Complaint  Patient presents with  . Shortness of Breath    (Consider location/radiation/quality/duration/timing/severity/associated sxs/prior treatment) Patient is a 66 y.o. female presenting with shortness of breath. The history is provided by the patient.  Shortness of Breath  The current episode started yesterday. The onset was gradual. The problem occurs continuously. The problem has been gradually worsening. The problem is moderate. Associated symptoms include shortness of breath. Pertinent negatives include no chest pain, no chest pressure and no fever. Her past medical history is significant for asthma and past wheezing.  Pt lives at home with daughter. States she began getting more SOB last night and has progressed since. Denies productive cough, fever, chills, chest pain. No lower ext swelling or pain. Noted to have decreased O2 sat by EMS.   Past Medical History  Diagnosis Date  . Angina   . Heart murmur   . Asthma   . Shortness of breath   . Cancer of breast unknown    RT Breast  . Depressed   . Coronary artery disease 2006    2 stents w/previous MI  . Hypertension   . CHF (congestive heart failure)   . Migraine   . Anxiety   . Myocardial infarction 2006  . COPD (chronic obstructive pulmonary disease)     Past Surgical History  Procedure Date  . Breast lumpectomy     Right  . Cardiac catheterization 2006    History reviewed. No pertinent family history.  History  Substance Use Topics  . Smoking status: Current Everyday Smoker -- 47 years    Types: Cigarettes  . Smokeless tobacco: Not on file  . Alcohol Use: Yes    OB History    Grav Para Term Preterm Abortions TAB SAB Ect Mult Living                  Review of Systems  Constitutional: Negative for fever and chills.  Respiratory: Positive for shortness of breath.  Negative for chest tightness.   Cardiovascular: Negative for chest pain, palpitations and leg swelling.  Gastrointestinal: Negative for nausea, vomiting, abdominal pain, diarrhea and constipation.  Genitourinary: Negative for dysuria.  Musculoskeletal: Negative for back pain and joint swelling.  Skin: Negative for color change, pallor and rash.  Neurological: Negative for dizziness, weakness, numbness and headaches.    Allergies  Ceftriaxone  Home Medications   Current Outpatient Rx  Name Route Sig Dispense Refill  . ALBUTEROL SULFATE HFA 108 (90 BASE) MCG/ACT IN AERS Inhalation Inhale 2 puffs into the lungs every 4 (four) hours as needed. Shortness of breath and wheezing     . ALPRAZOLAM 1 MG PO TABS Oral Take 1 mg by mouth 3 (three) times daily as needed. For anxitey     . ANASTROZOLE 1 MG PO TABS Oral Take 1 mg by mouth daily.      . ASPIRIN 325 MG PO TABS Oral Take 325 mg by mouth daily.      . ATORVASTATIN CALCIUM 80 MG PO TABS Oral Take 80 mg by mouth at bedtime.      Marland Kitchen BUTALBITAL-APAP-CAFFEINE 50-325-40 MG PO TABS Oral Take 1 tablet by mouth every 4 (four) hours as needed. For headaches     . CALCIUM CARBONATE ANTACID 500 MG PO CHEW Oral Chew 1 tablet by mouth daily.      Marland Kitchen  DICLOFENAC SODIUM 1 % TD GEL Topical Apply 1 application topically daily as needed. Hand pain    . ENALAPRIL MALEATE 5 MG PO TABS Oral Take 5 mg by mouth daily.      . OMEGA-3 FATTY ACIDS 1000 MG PO CAPS Oral Take 1 g by mouth daily.      Marland Kitchen FLUTICASONE PROPIONATE  HFA 220 MCG/ACT IN AERO Inhalation Inhale 2 puffs into the lungs 2 (two) times daily as needed. For shortness of breath     . FUROSEMIDE 40 MG PO TABS Oral Take 1 tablet (40 mg total) by mouth daily. 30 tablet 0  . IPRATROPIUM BROMIDE HFA 17 MCG/ACT IN AERS Inhalation Inhale 2 puffs into the lungs every 4 (four) hours as needed. For shortness of breath     . IPRATROPIUM-ALBUTEROL 0.5-2.5 (3) MG/3ML IN SOLN Nebulization Take 3 mLs by nebulization every  6 (six) hours as needed. For shortness of breath     . LACTULOSE 10 GM/15ML PO SOLN Oral Take 30 g by mouth as needed.      Marland Kitchen MONTELUKAST SODIUM 10 MG PO TABS Oral Take 10 mg by mouth at bedtime.      Marland Kitchen ONE-DAILY MULTI VITAMINS PO TABS Oral Take 1 tablet by mouth daily.      . MULTI-VITAMIN/MINERALS PO TABS Oral Take 1 tablet by mouth daily.    Marland Kitchen NORTRIPTYLINE HCL 25 MG PO CAPS Oral Take 25 mg by mouth at bedtime.      . OMEGA-3-ACID ETHYL ESTERS 1 G PO CAPS Oral Take 1 capsule (1 g total) by mouth daily. 30 capsule 0  . OMEPRAZOLE 20 MG PO CPDR Oral Take 20 mg by mouth daily.      Marland Kitchen PANTOPRAZOLE SODIUM 40 MG PO TBEC Oral Take 40 mg by mouth daily.    Marland Kitchen POTASSIUM CHLORIDE 10 MEQ PO TBCR Oral Take 20 mEq by mouth daily.        BP 110/51  Pulse 74  Temp(Src) 98.6 F (37 C) (Oral)  Resp 19  SpO2 100%  Physical Exam  Nursing note and vitals reviewed. Constitutional: She is oriented to person, place, and time. She appears well-developed and well-nourished. No distress.  HENT:  Head: Normocephalic and atraumatic.  Mouth/Throat: Oropharynx is clear and moist.  Eyes: EOM are normal. Pupils are equal, round, and reactive to light.  Neck: Normal range of motion. Neck supple.  Cardiovascular: Normal rate and regular rhythm.   Pulmonary/Chest: No respiratory distress. She has wheezes. She has no rales. She exhibits no tenderness.  Abdominal: Soft. Bowel sounds are normal.  Musculoskeletal: Normal range of motion. She exhibits no edema and no tenderness.  Neurological: She is alert and oriented to person, place, and time.  Skin: Skin is warm and dry. No rash noted. No erythema.  Psychiatric: She has a normal mood and affect. Her behavior is normal.    ED Course  Procedures (including critical care time)  Labs Reviewed  CBC - Abnormal; Notable for the following:    MCH 25.9 (*)    RDW 18.7 (*)    All other components within normal limits  DIFFERENTIAL - Abnormal; Notable for the  following:    Neutrophils Relative 86 (*)    Lymphocytes Relative 6 (*)    Lymphs Abs 0.5 (*)    All other components within normal limits  BASIC METABOLIC PANEL - Abnormal; Notable for the following:    Potassium 6.3 (*)    Chloride 95 (*)  BUN 35 (*)    Creatinine, Ser 1.84 (*)    GFR calc non Af Amer 28 (*)    GFR calc Af Amer 32 (*)    All other components within normal limits  CARDIAC PANEL(CRET KIN+CKTOT+MB+TROPI) - Abnormal; Notable for the following:    CK, MB 5.5 (*)    All other components within normal limits  BLOOD GAS, ARTERIAL - Abnormal; Notable for the following:    pH, Arterial 7.216 (*)    pCO2 arterial 79.3 (*)    pO2, Arterial 307.0 (*)    Bicarbonate 31.0 (*)    All other components within normal limits  POTASSIUM   Dg Chest Port 1 View  11/10/2011  *RADIOLOGY REPORT*  Clinical Data: Shortness of breath, asthma, hypertension.  PORTABLE CHEST - 1 VIEW  Comparison: 08/01/2011  Findings: Cardiomegaly with vascular congestion.  Diffuse interstitial prominence throughout the lungs, similar to prior study.  This could reflect mild interstitial edema or chronic interstitial lung disease.  Recommend clinical correlation.  No pleural effusions.  No acute bony abnormality.  IMPRESSION: Cardiomegaly with stable interstitial prominence, recurrent interstitial edema versus chronic lung disease.  Original Report Authenticated By: Cyndie Chime, M.D.     1. COPD exacerbation   2. Hyperkalemia      Date: 11/10/2011  Rate: 79  Rhythm: normal sinus rhythm  QRS Axis: normal  Intervals: normal  ST/T Wave abnormalities: nonspecific ST changes  Conduction Disutrbances:none  Narrative Interpretation:   Old EKG Reviewed: unchanged and Poor quality EKG, no obvious changes noted     MDM    Pt states she is feeling better after neb treatment    Suspect K+ falsely elevated. Will repeat K+. [facility] to see in ED and admit    Loren Racer, MD 11/10/11 1633  Loren Racer, MD 11/10/11 [phone]

## 2011-11-11 ENCOUNTER — Other Ambulatory Visit: Payer: Self-pay

## 2011-11-11 ENCOUNTER — Encounter (HOSPITAL_COMMUNITY): Payer: Self-pay | Admitting: Cardiology

## 2011-11-11 ENCOUNTER — Ambulatory Visit: Payer: Medicare Other | Admitting: Oncology

## 2011-11-11 ENCOUNTER — Other Ambulatory Visit: Payer: Medicare Other | Admitting: Lab

## 2011-11-11 ENCOUNTER — Emergency Department (HOSPITAL_COMMUNITY): Payer: Medicare Other

## 2011-11-11 DIAGNOSIS — M7989 Other specified soft tissue disorders: Secondary | ICD-10-CM | POA: Insufficient documentation

## 2011-11-11 DIAGNOSIS — E875 Hyperkalemia: Secondary | ICD-10-CM | POA: Diagnosis present

## 2011-11-11 DIAGNOSIS — N179 Acute kidney failure, unspecified: Secondary | ICD-10-CM | POA: Insufficient documentation

## 2011-11-11 DIAGNOSIS — E872 Acidosis: Secondary | ICD-10-CM | POA: Diagnosis present

## 2011-11-11 DIAGNOSIS — R0602 Shortness of breath: Secondary | ICD-10-CM | POA: Diagnosis present

## 2011-11-11 HISTORY — DX: Hyperkalemia: E87.5

## 2011-11-11 LAB — BLOOD GAS, ARTERIAL
Acid-Base Excess: 0.5 mmol/L (ref 0.0–2.0)
Bicarbonate: 28.3 mEq/L — ABNORMAL HIGH (ref 20.0–24.0)
Drawn by: 295031
O2 Content: 3 L/min
O2 Saturation: 94.7 %
Patient temperature: 98.6
TCO2: 26.7 mmol/L (ref 0–100)
pCO2 arterial: 65.1 mmHg (ref 35.0–45.0)
pH, Arterial: 7.261 — ABNORMAL LOW (ref 7.350–7.400)
pO2, Arterial: 80.7 mmHg (ref 80.0–100.0)

## 2011-11-11 LAB — CBC
HCT: 37 % (ref 36.0–46.0)
Hemoglobin: 11.2 g/dL — ABNORMAL LOW (ref 12.0–15.0)
MCH: 24.5 pg — ABNORMAL LOW (ref 26.0–34.0)
MCHC: 28.1 g/dL — ABNORMAL LOW (ref 30.0–36.0)
MCV: 87.1 fL (ref 78.0–100.0)
Platelets: 214 10*3/uL (ref 150–400)
RBC: 4.25 MIL/uL (ref 3.87–5.11)
RDW: 18.6 % — ABNORMAL HIGH (ref 11.5–15.5)
WBC: 8.5 10*3/uL (ref 4.0–10.5)

## 2011-11-11 LAB — CARDIAC PANEL(CRET KIN+CKTOT+MB+TROPI)
CK, MB: 5.2 ng/mL — ABNORMAL HIGH (ref 0.3–4.0)
CK, MB: 5.5 ng/mL — ABNORMAL HIGH (ref 0.3–4.0)
CK, MB: 5.5 ng/mL — ABNORMAL HIGH (ref 0.3–4.0)
Relative Index: INVALID (ref 0.0–2.5)
Relative Index: 2.7 — ABNORMAL HIGH (ref 0.0–2.5)
Relative Index: 2.8 — ABNORMAL HIGH (ref 0.0–2.5)
Total CK: 66 U/L (ref 7–177)
Total CK: 202 U/L — ABNORMAL HIGH (ref 7–177)
Total CK: 194 U/L — ABNORMAL HIGH (ref 7–177)
Troponin I: 0.33 ng/mL (ref <0.30)
Troponin I: 0.32 ng/mL (ref <0.30)
Troponin I: 0.35 ng/mL (ref <0.30)

## 2011-11-11 LAB — BASIC METABOLIC PANEL
BUN: 29 mg/dL — ABNORMAL HIGH (ref 6–23)
CO2: 28 mEq/L (ref 19–32)
Calcium: 9.1 mg/dL (ref 8.4–10.5)
Chloride: 98 mEq/L (ref 96–112)
Creatinine, Ser: 1.06 mg/dL (ref 0.50–1.10)
GFR calc Af Amer: 62 mL/min — ABNORMAL LOW (ref >90)
GFR calc non Af Amer: 54 mL/min — ABNORMAL LOW (ref >90)
Glucose, Bld: 106 mg/dL — ABNORMAL HIGH (ref 70–99)
Potassium: 5.5 mEq/L — ABNORMAL HIGH (ref 3.5–5.1)
Sodium: 134 mEq/L — ABNORMAL LOW (ref 135–145)

## 2011-11-11 LAB — PRO B NATRIURETIC PEPTIDE: Pro B Natriuretic peptide (BNP): 4129 pg/mL — ABNORMAL HIGH (ref 0–125)

## 2011-11-11 LAB — DIFFERENTIAL
Basophils Absolute: 0 10*3/uL (ref 0.0–0.1)
Basophils Relative: 0 % (ref 0–1)
Eosinophils Absolute: 0 10*3/uL (ref 0.0–0.7)
Eosinophils Relative: 0 % (ref 0–5)
Lymphocytes Relative: 9 % — ABNORMAL LOW (ref 12–46)
Lymphs Abs: 0.8 10*3/uL (ref 0.7–4.0)
Monocytes Absolute: 0.8 10*3/uL (ref 0.1–1.0)
Monocytes Relative: 10 % (ref 3–12)
Neutro Abs: 6.9 10*3/uL (ref 1.7–7.7)
Neutrophils Relative %: 81 % — ABNORMAL HIGH (ref 43–77)

## 2011-11-11 LAB — LIPID PANEL
Cholesterol: 83 mg/dL (ref 0–200)
HDL: 40 mg/dL (ref >39)
LDL Cholesterol: 27 mg/dL (ref 0–99)
Total CHOL/HDL Ratio: 2.1 RATIO
Triglycerides: 80 mg/dL (ref <150)
VLDL: 16 mg/dL (ref 0–40)

## 2011-11-11 LAB — TSH: TSH: 0.521 u[IU]/mL (ref 0.350–4.500)

## 2011-11-11 LAB — MAGNESIUM: Magnesium: 1.9 mg/dL (ref 1.5–2.5)

## 2011-11-11 MED ORDER — FUROSEMIDE 20 MG PO TABS
20.0000 mg | ORAL_TABLET | Freq: Every day | ORAL | Status: DC
  Filled 2011-11-11: qty 1

## 2011-11-11 MED ORDER — TECHNETIUM TO 99M ALBUMIN AGGREGATED
4.4000 | Freq: Once | INTRAVENOUS | Status: AC | PRN
  Administered 2011-11-11: 4 via INTRAVENOUS

## 2011-11-11 MED ORDER — FLUDEOXYGLUCOSE F - 18 (FDG) INJECTION: 10.6000 | Freq: Once | INTRAVENOUS | Status: DC | PRN

## 2011-11-11 MED ORDER — VANCOMYCIN HCL 500 MG IV SOLR
500.0000 mg | INTRAVENOUS | Status: DC
  Administered 2011-11-11 – 2011-11-12 (×2): 500 mg via INTRAVENOUS
  Filled 2011-11-11 (×2): qty 500

## 2011-11-11 MED ORDER — IPRATROPIUM BROMIDE 0.02 % IN SOLN
0.5000 mg | RESPIRATORY_TRACT | Status: DC
  Administered 2011-11-12 (×5): 0.5 mg via RESPIRATORY_TRACT
  Filled 2011-11-11 (×5): qty 2.5

## 2011-11-11 MED ORDER — VANCOMYCIN HCL 1000 MG IV SOLR: 1250.0000 mg | Freq: Two times a day (BID) | INTRAVENOUS | Status: DC

## 2011-11-11 MED ORDER — FUROSEMIDE 40 MG PO TABS
40.0000 mg | ORAL_TABLET | Freq: Every day | ORAL | Status: DC
  Administered 2011-11-11 – 2011-11-13 (×3): 40 mg via ORAL
  Filled 2011-11-11 (×3): qty 1

## 2011-11-11 MED ORDER — ALBUTEROL SULFATE (5 MG/ML) 0.5% IN NEBU: 2.5000 mg | INHALATION_SOLUTION | RESPIRATORY_TRACT | Status: DC | PRN

## 2011-11-11 MED ORDER — DICLOFENAC SODIUM 1 % TD GEL
1.0000 "application " | Freq: Every day | TRANSDERMAL | Status: DC | PRN
  Filled 2011-11-11: qty 100

## 2011-11-11 MED ORDER — METHYLPREDNISOLONE SODIUM SUCC 40 MG IJ SOLR
40.0000 mg | Freq: Two times a day (BID) | INTRAMUSCULAR | Status: DC
  Administered 2011-11-11 – 2011-11-12 (×2): 40 mg via INTRAVENOUS
  Filled 2011-11-11 (×2): qty 1
  Filled 2011-11-11: qty 2
  Filled 2011-11-11: qty 1

## 2011-11-11 MED ORDER — ENALAPRIL MALEATE 5 MG PO TABS
5.0000 mg | ORAL_TABLET | Freq: Every day | ORAL | Status: DC
  Administered 2011-11-11: 5 mg via ORAL
  Filled 2011-11-11: qty 1

## 2011-11-11 MED ORDER — XENON XE 133 GAS
10.6000 | GAS_FOR_INHALATION | Freq: Once | RESPIRATORY_TRACT | Status: AC | PRN
  Administered 2011-11-11: 11 via RESPIRATORY_TRACT

## 2011-11-11 MED ORDER — ALBUTEROL SULFATE (5 MG/ML) 0.5% IN NEBU
2.5000 mg | INHALATION_SOLUTION | RESPIRATORY_TRACT | Status: DC
  Administered 2011-11-11 – 2011-11-15 (×23): 2.5 mg via RESPIRATORY_TRACT
  Filled 2011-11-11 (×21): qty 0.5

## 2011-11-11 MED ORDER — OMEGA-3-ACID ETHYL ESTERS 1 G PO CAPS
1.0000 g | ORAL_CAPSULE | Freq: Every day | ORAL | Status: DC
  Administered 2011-11-11: 1 g via ORAL
  Filled 2011-11-11 (×2): qty 1

## 2011-11-11 MED ORDER — OMEGA-3 FATTY ACIDS 1000 MG PO CAPS: 1.0000 g | ORAL_CAPSULE | Freq: Every day | ORAL | Status: DC

## 2011-11-11 NOTE — Progress Notes (Addendum)
Patient ID: [name], female   DOB: Jul 28, 1946, 66 y.o.   MRN: [id number]  Subjective: No events overnight. Patient denies chest pain, shortness of breath, abdominal pain at this time. She reports worsening right upper extremity swelling, warm to touch, with redness.  Objective:  Vital signs in last 24 hours:  Filed Vitals:   11/11/11 0600 11/11/11 0615 11/11/11 0630 11/11/11 0736  BP: 102/44  115/60   Pulse: 84 84 85   Temp:      TempSrc:      Resp: 14 14 14    Weight:      SpO2: 95% 96% 95% 93%    Intake/Output from previous day:  No intake or output data in the 24 hours ending 11/11/11 1047  Physical Exam: General: Alert, awake, oriented x3, in no acute distress. HEENT: No bruits, no goiter. Moist mucous membranes, no scleral icterus, no conjunctival pallor. Heart: Regular rate and rhythm, S1/S2 +, no murmurs, rubs, gallops. Lungs: Clear to auscultation bilaterally with end expiratory wheezing and minimal bibasilar crackles, no rhonchi, no rales.  Abdomen: Soft, nontender, nondistended, positive bowel sounds. Extremities: No clubbing or cyanosis, no pitting edema,  positive pedal pulses. Right upper extremity swelling from wrist to elbow, warm to touch and with erythema, pulse is palpable and strong. Neuro: Grossly nonfocal.  Lab Results:  Basic Metabolic Panel:    Component Value Date/Time   NA 136 11/10/2011 1400   K 6.2* 11/10/2011 1730   CL 95* 11/10/2011 1400   CO2 31 11/10/2011 1400   BUN 35* 11/10/2011 1400   CREATININE 1.84* 11/10/2011 1400   GLUCOSE 96 11/10/2011 1400   CALCIUM 9.5 11/10/2011 1400   CBC:    Component Value Date/Time   WBC 8.0 11/10/2011 1400   WBC 6.3 07/28/2011 1532   HGB 12.6 11/10/2011 1400   HGB 13.0 07/28/2011 1532   HCT 41.9 11/10/2011 1400   HCT 42.0 07/28/2011 1532   PLT 312 11/10/2011 1400   PLT 234 07/28/2011 1532   MCV 86.2 11/10/2011 1400   MCV 83.3 07/28/2011 1532   NEUTROABS 6.9 11/10/2011 1400   NEUTROABS 4.6 07/28/2011 1532    LYMPHSABS 0.5* 11/10/2011 1400   LYMPHSABS 1.2 07/28/2011 1532   MONOABS 0.6 11/10/2011 1400   MONOABS 0.4 07/28/2011 1532   EOSABS 0.0 11/10/2011 1400   EOSABS 0.0 07/28/2011 1532   BASOSABS 0.1 11/10/2011 1400   BASOSABS 0.0 07/28/2011 1532      Lab 11/10/11 1400  WBC 8.0  HGB 12.6  HCT 41.9  PLT 312  MCV 86.2  MCH 25.9*  MCHC 30.1  RDW 18.7*  LYMPHSABS 0.5*  MONOABS 0.6  EOSABS 0.0  BASOSABS 0.1  BANDABS --    Lab 11/10/11 1730 11/10/11 1400  NA -- 136  K 6.2* 6.3*  CL -- 95*  CO2 -- 31  GLUCOSE -- 96  BUN -- 35*  CREATININE -- 1.84*  CALCIUM -- 9.5  MG -- --   No results found for this basename: INR:5,PROTIME:5 in the last 168 hours Cardiac markers:  Lab 11/11/11 0254 11/10/11 1920 11/10/11 1410  CKMB 5.2* 6.1* 5.5*  TROPONINI 0.33* 0.41* <0.30  MYOGLOBIN -- -- --   Studies/Results: Dg Chest Port 1 View 11/10/2011  IMPRESSION: Cardiomegaly with stable interstitial prominence, recurrent interstitial edema versus chronic lung disease.    Medications: Scheduled Meds:   . albuterol  2.5 mg Nebulization Q6H  . albuterol  10 mg/hr Nebulization Once  . amoxicillin-clavulanate  1 tablet Oral TID  .  anastrozole  1 mg Oral Daily  . calcium carbonate  1 tablet Oral Daily  . dextrose  50 mL Intravenous Once  . enalapril  5 mg Oral Daily  . enoxaparin (LOVENOX) injection  60 mg Subcutaneous Q24H  . furosemide  20 mg Intravenous Once  . insulin regular  10 Units Intravenous Once  . ipratropium  0.5 mg Nebulization Q6H  . methylPREDNISolone (SOLU-MEDROL) injection  125 mg Intravenous Once  . methylPREDNISolone (SOLU-MEDROL) injection  80 mg Intravenous Q6H  . montelukast  10 mg Oral QHS  . mulitivitamin with minerals  1 tablet Oral Daily  . nortriptyline  25 mg Oral QHS  . omega-3 acid ethyl esters  1 g Oral Daily  . omega-3 acid ethyl esters  1 g Oral Daily  . pantoprazole  40 mg Oral Q1200  . simvastatin  20 mg Oral Daily  . sodium bicarbonate  50 mEq  Intravenous Once  . sodium chloride  500 mL Intravenous Once  . sodium chloride  500 mL Intravenous Once  . DISCONTD: albuterol      . DISCONTD: fish oil-omega-3 fatty acids  1 g Oral Daily  . DISCONTD: insulin aspart  10 Units Subcutaneous Once  . DISCONTD: insulin regular  10 Units Subcutaneous Once  . DISCONTD: insulin regular  10 Units Subcutaneous Once  . DISCONTD: methylPREDNISolone (SOLU-MEDROL) injection  80 mg Intravenous Q6H  . DISCONTD: multivitamin with minerals  1 tablet Oral Daily  . DISCONTD: pantoprazole  40 mg Oral Daily   Continuous Infusions:   . sodium chloride 50 mL/hr at 11/11/11 0418   PRN Meds:.ALPRAZolam, butalbital-acetaminophen-caffeine  Assessment/Plan:  Principal Problem:  *Shortness of breath - unclear etiology at this time and potentially multifactorial - there is certainly component of COPD exacerbation given physical exam findings and ABG results, however in the setting of elevated troponins and d-dimer, history of NSTEMI, we can not rule out ACS, PE as underlying cause of SOB - will continue treatment for COPD as initially recommended by admitting physician but will also obtain V/Q scan for further evaluation and will call cardiology consult for further recommendations given extensive history of CAD - will admit the patient to SDU for now, cycle cardiac enzymes, repeat ABG, repeat BMP to assess potassium level - continue home dose Lasix and d/c IVF - monitor vitals per floor protocol  Active Problems:  COPD (chronic obstructive pulmonary disease) - treat with nebulizer PRN and scheduled - repeat ABG - taper down solumedrol   ACUTE RENAL FAILURE - unclear etiology - likely prerenal  - hold off on IVF and obtain BMP - continue low dose lasix but d/c if Cr trending up - follow up on cardiology recommendations   Hyperkalemia - unclear etiology - repeat BMP to assess the level - obtain STAT EKG   Swelling of extremity, right - unclear  etiology, Doppler negative for blot clot - perhaps related to infectious etiology  - will start antibiotic for now (Vancomycin) since physical exam somewhat consistent with cellulitis - keep extremity elevated   Respiratory acidosis - with hypercapnia - most likely secondary to acute on chronic COPD exacerbation - continue to provide supportive care with nebulizers, solumedrol   CHF (congestive heart failure) - continue home dose regimen - check BNP - hold IVF   CAD (coronary artery disease) - monitor pt in SDU - cycle CE's - cardiology consult   Hyperlipidemia - check FLP   EDUCATION - test results and diagnostic studies were discussed with patient  -  patient verbalized the understanding - questions were answered at the bedside and contact information was provided for additional questions or concerns   LOS: 1 day   MAGICK-Cristabel Bicknell 11/11/2011, 10:47 AM  [facility] Pager: [phone]

## 2011-11-11 NOTE — ED Notes (Signed)
BJY:[id number]<FA> Expected date:<BR> Expected time:<BR> Means of arrival:<BR> Comments:<BR> Hold for res B

## 2011-11-11 NOTE — Progress Notes (Signed)
Onc tx schedule sent re: r/s pt's FTKA today.

## 2011-11-11 NOTE — Consult Note (Signed)
Reason for Consult: CHF, abnormal troponin  Referring Physician: [facility]   Theodore Rahrig is an 66 y.o. female.    Chief Complaint: SOB   HPI: Patient is a 66 year old Caucasian female with history of COPD, coronary artery disease and congestive heart failure-compensated presenting to the emergency room with shortness of breath a couple of days duration and this was said to have gotten progressively worse 48 hours prior to presenting to the emergency room. Shortness of breath is said to be associated with cough that is productive of yellowish sputum. Denies any associated chest pain. No palpitations. No nausea or vomiting. Patient denies any PND or orthopnea. She denies any fever, chills or Rigors. Symptoms are said to be getting progressively worse hence patient presented to the emergency room to be evaluated.   Seen by internal medicine and decided COPD exacerbation, placed on IV steroids, nebs and augmentin.  Also gentle hydration with normal saline.   BP was borderline so lasix held.  D-dimer was elevated  0.86 and VQ scan has been ordered-   Cardiac history:  Last nuclear stress test 08/03/11 with neg. Ischemia and normal LV function by Echo Aortic sclerosis.  Stage 1 diastolic dysfunction.   Was hospitalized 07/2011 and had pul. Edema secondary to hypoxia from xanax overdose, diastolic dysfunction and renal failure--Cr. 1.62 on admit then down to 0.73.  Past Medical History  Diagnosis Date  . Angina   . Heart murmur   . Asthma   . Shortness of breath   . Cancer of breast unknown    RT Breast  . Depressed   . Coronary artery disease 2006    2 stents w/previous MI  . Hypertension   . CHF (congestive heart failure)   . Migraine   . Anxiety   . Myocardial infarction 2006  . COPD (chronic obstructive pulmonary disease)     Past Surgical History  Procedure Date  . Breast lumpectomy     Right  . Cardiac catheterization 2006    History reviewed. No pertinent family  history. Social History:  reports that she has been smoking Cigarettes.  She has smoked for the past 47 years. She has never used smokeless tobacco. She reports that she drinks alcohol. She reports that she does not use illicit drugs.  Allergies:  Allergies  Allergen Reactions  . Ceftriaxone Other (See Comments)    "Blow up like a balloon"    Medications Prior to Admission  Medication Dose Route Frequency Provider Last Rate Last Dose  . albuterol (PROVENTIL) (5 MG/ML) 0.5% nebulizer solution 2.5 mg  2.5 mg Nebulization Q4H Iskra Magick-Myers, MD   2.5 mg at 11/11/11 1258  . albuterol (PROVENTIL) (5 MG/ML) 0.5% nebulizer solution 2.5 mg  2.5 mg Nebulization Q2H PRN Iskra Magick-Myers, MD      . albuterol (PROVENTIL,VENTOLIN) solution continuous neb  10 mg/hr Nebulization Once Loren Racer, MD   10 mg/hr at 11/10/11 1445  . ALPRAZolam Prudy Feeler) tablet 1 mg  1 mg Oral TID PRN Talmage Nap, MD   1 mg at 11/10/11 2253  . anastrozole (ARIMIDEX) tablet 1 mg  1 mg Oral Daily Talmage Nap, MD   1 mg at 11/11/11 1106  . butalbital-acetaminophen-caffeine (FIORICET, ESGIC) 50-325-40 MG per tablet 1 tablet  1 tablet Oral Q4H PRN Talmage Nap, MD      . calcium carbonate (TUMS - dosed in mg elemental calcium) chewable tablet 200 mg of elemental calcium  1 tablet Oral Daily Talmage Nap, MD   200  mg of elemental calcium at 11/11/11 1108  . dextrose 50 % solution 50 mL  50 mL Intravenous Once Loren Racer, MD   50 mL at 11/10/11 1715  . enalapril (VASOTEC) tablet 5 mg  5 mg Oral Daily Talmage Nap, MD   5 mg at 11/11/11 1107  . enoxaparin (LOVENOX) injection 60 mg  60 mg Subcutaneous Q24H Berkley Harvey, [place]   60 mg at 11/10/11 2105  . furosemide (LASIX) injection 20 mg  20 mg Intravenous Once Loren Racer, MD   20 mg at 11/10/11 1806  . furosemide (LASIX) tablet 20 mg  20 mg Oral Daily Iskra Magick-Myers, MD      . insulin regular (NOVOLIN R,HUMULIN R) 100 units/mL  injection 10 Units  10 Units Intravenous Once Carleene Cooper III, MD   10 Units at 11/10/11 1715  . ipratropium (ATROVENT) nebulizer solution 0.5 mg  0.5 mg Nebulization Q6H Talmage Nap, MD   0.5 mg at 11/11/11 1301  . methylPREDNISolone sodium succinate (SOLU-MEDROL) 125 MG injection 125 mg  125 mg Intravenous Once Loren Racer, MD   125 mg at 11/10/11 1429  . methylPREDNISolone sodium succinate (SOLU-MEDROL) 40 MG injection 40 mg  40 mg Intravenous Q12H Iskra Magick-Myers, MD      . montelukast (SINGULAIR) tablet 10 mg  10 mg Oral QHS Talmage Nap, MD   10 mg at 11/10/11 2231  . mulitivitamin with minerals tablet 1 tablet  1 tablet Oral Daily Talmage Nap, MD   1 tablet at 11/11/11 1116  . nortriptyline (PAMELOR) capsule 25 mg  25 mg Oral QHS Talmage Nap, MD   25 mg at 11/10/11 2232  . omega-3 acid ethyl esters (LOVAZA) capsule 1 g  1 g Oral Daily Talmage Nap, MD   1 g at 11/11/11 1105  . omega-3 acid ethyl esters (LOVAZA) capsule 1 g  1 g Oral Daily Talmage Nap, MD   1 g at 11/11/11 1106  . pantoprazole (PROTONIX) EC tablet 40 mg  40 mg Oral Q1200 Talmage Nap, MD   40 mg at 11/10/11 2253  . simvastatin (ZOCOR) tablet 20 mg  20 mg Oral Daily Talmage Nap, MD   20 mg at 11/11/11 1109  . sodium bicarbonate injection 50 mEq  50 mEq Intravenous Once Loren Racer, MD   50 mEq at 11/10/11 1715  . sodium chloride 0.9 % bolus 500 mL  500 mL Intravenous Once Loren Racer, MD   500 mL at 11/10/11 1429  . sodium chloride 0.9 % bolus 500 mL  500 mL Intravenous Once Loren Racer, MD   500 mL at 11/10/11 1609  . vancomycin (VANCOCIN) 500 mg in sodium chloride 0.9 % 100 mL IVPB  500 mg Intravenous Q24H Jodelle Gross, PHARMD   500 mg at 11/11/11 1143  . DISCONTD: 0.9 %  sodium chloride infusion   Intravenous Continuous Talmage Nap, MD 50 mL/hr at 11/11/11 0418    . DISCONTD: albuterol (PROVENTIL) (5 MG/ML) 0.5% nebulizer solution 2.5 mg  2.5  mg Nebulization Q6H Talmage Nap, MD   2.5 mg at 11/11/11 0735  . DISCONTD: albuterol (PROVENTIL) (5 MG/ML) 0.5% nebulizer solution           . DISCONTD: amoxicillin-clavulanate (AUGMENTIN) 500-125 MG per tablet 500 mg  1 tablet Oral TID Talmage Nap, MD   500 mg at 11/11/11 1116  . DISCONTD: fish oil-omega-3 fatty acids capsule 1 g  1 g Oral Daily Talmage Nap, MD      . DISCONTD:  insulin aspart (novoLOG) 100 UNIT/ML injection           . DISCONTD: insulin aspart (novoLOG) injection 10 Units  10 Units Subcutaneous Once Loren Racer, MD      . DISCONTD: insulin regular (NOVOLIN R,HUMULIN R) 100 units/mL injection 10 Units  10 Units Subcutaneous Once Loren Racer, MD      . DISCONTD: insulin regular (NOVOLIN R,HUMULIN R) 100 units/mL injection 10 Units  10 Units Subcutaneous Once Loren Racer, MD      . DISCONTD: methylPREDNISolone sodium succinate (SOLU-MEDROL) 125 MG injection 80 mg  80 mg Intravenous Q6H Talmage Nap, MD      . DISCONTD: methylPREDNISolone sodium succinate (SOLU-MEDROL) 125 MG injection 80 mg  80 mg Intravenous Q6H Talmage Nap, MD   80 mg at 11/11/11 1118  . DISCONTD: multivitamin with minerals tablet 1 tablet  1 tablet Oral Daily Talmage Nap, MD      . DISCONTD: pantoprazole (PROTONIX) EC tablet 40 mg  40 mg Oral Daily Talmage Nap, MD      . DISCONTD: vancomycin (VANCOCIN) 1,250 mg in sodium chloride 0.9 % 250 mL IVPB  1,250 mg Intravenous Q12H Iskra Magick-Myers, MD       Medications Prior to Admission  Medication Sig Dispense Refill  . albuterol (PROVENTIL HFA;VENTOLIN HFA) 108 (90 BASE) MCG/ACT inhaler Inhale 2 puffs into the lungs every 4 (four) hours as needed. Shortness of breath and wheezing       . ALPRAZolam (XANAX) 1 MG tablet Take 1 mg by mouth 3 (three) times daily as needed. For anxitey       . anastrozole (ARIMIDEX) 1 MG tablet Take 1 mg by mouth daily.        Marland Kitchen aspirin 325 MG tablet Take 325 mg by mouth daily.          Marland Kitchen atorvastatin (LIPITOR) 80 MG tablet Take 80 mg by mouth at bedtime.        . butalbital-acetaminophen-caffeine (FIORICET, ESGIC) 50-325-40 MG per tablet Take 1 tablet by mouth every 4 (four) hours as needed. For headaches       . calcium carbonate (TUMS - DOSED IN MG ELEMENTAL CALCIUM) 500 MG chewable tablet Chew 1 tablet by mouth daily.        . enalapril (VASOTEC) 5 MG tablet Take 5 mg by mouth daily.        . fish oil-omega-3 fatty acids 1000 MG capsule Take 1 g by mouth daily.        . fluticasone (FLOVENT HFA) 220 MCG/ACT inhaler Inhale 2 puffs into the lungs 2 (two) times daily as needed. For shortness of breath       . furosemide (LASIX) 40 MG tablet Take 1 tablet (40 mg total) by mouth daily.  30 tablet  0  . ipratropium (ATROVENT HFA) 17 MCG/ACT inhaler Inhale 2 puffs into the lungs every 4 (four) hours as needed. For shortness of breath       . ipratropium-albuterol (DUONEB) 0.5-2.5 (3) MG/3ML SOLN Take 3 mLs by nebulization every 6 (six) hours as needed. For shortness of breath       . lactulose (CHRONULAC) 10 GM/15ML solution Take 30 g by mouth as needed.        . montelukast (SINGULAIR) 10 MG tablet Take 10 mg by mouth at bedtime.        . nortriptyline (PAMELOR) 25 MG capsule Take 25 mg by mouth at bedtime.        Marland Kitchen omega-3 acid ethyl  esters (LOVAZA) 1 G capsule Take 1 capsule (1 g total) by mouth daily.  30 capsule  0  . potassium chloride (KLOR-CON) 10 MEQ CR tablet Take 20 mEq by mouth daily.          Results for orders placed during the hospital encounter of 11/10/11 (from the past 48 hour(s))  CBC     Status: Abnormal   Collection Time   11/10/11  2:00 PM      Component Value Range Comment   WBC 8.0  4.0 - 10.5 (K/uL)    RBC 4.86  3.87 - 5.11 (MIL/uL)    Hemoglobin 12.6  12.0 - 15.0 (g/dL)    HCT 57.8  46.9 - 62.9 (%)    MCV 86.2  78.0 - 100.0 (fL)    MCH 25.9 (*) 26.0 - 34.0 (pg)    MCHC 30.1  30.0 - 36.0 (g/dL)    RDW 52.8 (*) 41.3 - 15.5 (%)    Platelets 312  150  - 400 (K/uL)   DIFFERENTIAL     Status: Abnormal   Collection Time   11/10/11  2:00 PM      Component Value Range Comment   Neutrophils Relative 86 (*) 43 - 77 (%)    Neutro Abs 6.9  1.7 - 7.7 (K/uL)    Lymphocytes Relative 6 (*) 12 - 46 (%)    Lymphs Abs 0.5 (*) 0.7 - 4.0 (K/uL)    Monocytes Relative 8  3 - 12 (%)    Monocytes Absolute 0.6  0.1 - 1.0 (K/uL)    Eosinophils Relative 0  0 - 5 (%)    Eosinophils Absolute 0.0  0.0 - 0.7 (K/uL)    Basophils Relative 1  0 - 1 (%)    Basophils Absolute 0.1  0.0 - 0.1 (K/uL)   BASIC METABOLIC PANEL     Status: Abnormal   Collection Time   11/10/11  2:00 PM      Component Value Range Comment   Sodium 136  135 - 145 (mEq/L)    Potassium 6.3 (*) 3.5 - 5.1 (mEq/L)    Chloride 95 (*) 96 - 112 (mEq/L)    CO2 31  19 - 32 (mEq/L)    Glucose, Bld 96  70 - 99 (mg/dL)    BUN 35 (*) 6 - 23 (mg/dL)    Creatinine, Ser 2.44 (*) 0.50 - 1.10 (mg/dL)    Calcium 9.5  8.4 - 10.5 (mg/dL)    GFR calc non Af Amer 28 (*) >90 (mL/min)    GFR calc Af Amer 32 (*) >90 (mL/min)   CARDIAC PANEL(CRET KIN+CKTOT+MB+TROPI)     Status: Abnormal   Collection Time   11/10/11  2:10 PM      Component Value Range Comment   Total CK 87  7 - 177 (U/L)    CK, MB 5.5 (*) 0.3 - 4.0 (ng/mL)    Troponin I <0.30  <0.30 (ng/mL)    Relative Index RELATIVE INDEX IS INVALID  0.0 - 2.5    BLOOD GAS, ARTERIAL     Status: Abnormal   Collection Time   11/10/11  3:22 PM      Component Value Range Comment   FIO2 1.00      Delivery systems NRB MASK      pH, Arterial 7.216 (*) 7.350 - 7.400     pCO2 arterial 79.3 (*) 35.0 - 45.0 (mmHg)    pO2, Arterial 307.0 (*) 80.0 - 100.0 (mmHg)  Bicarbonate 31.0 (*) 20.0 - 24.0 (mEq/L)    TCO2 29.3  0 - 100 (mmol/L)    Acid-Base Excess 1.5  0.0 - 2.0 (mmol/L)    O2 Saturation 99.6      Patient temperature 98.6      Collection site RIGHT BRACHIAL   LEFT BRACHIAL   Drawn by [phone]      Sample type ARTERIAL DRAW      Allens test (pass/fail) PASS   PASS    POTASSIUM     Status: Abnormal   Collection Time   11/10/11  5:30 PM      Component Value Range Comment   Potassium 6.2 (*) 3.5 - 5.1 (mEq/L)   CARDIAC PANEL(CRET KIN+CKTOT+MB+TROPI)     Status: Abnormal   Collection Time   11/10/11  7:20 PM      Component Value Range Comment   Total CK 79  7 - 177 (U/L)    CK, MB 6.1 (*) 0.3 - 4.0 (ng/mL)    Troponin I 0.41 (*) <0.30 (ng/mL)    Relative Index RELATIVE INDEX IS INVALID  0.0 - 2.5    D-DIMER, QUANTITATIVE     Status: Abnormal   Collection Time   11/10/11  7:20 PM      Component Value Range Comment   D-Dimer, Quant 0.86 (*) 0.00 - 0.48 (ug/mL-FEU)   CARDIAC PANEL(CRET KIN+CKTOT+MB+TROPI)     Status: Abnormal   Collection Time   11/11/11  2:54 AM      Component Value Range Comment   Total CK 66  7 - 177 (U/L)    CK, MB 5.2 (*) 0.3 - 4.0 (ng/mL)    Troponin I 0.33 (*) <0.30 (ng/mL)    Relative Index RELATIVE INDEX IS INVALID  0.0 - 2.5    CARDIAC PANEL(CRET KIN+CKTOT+MB+TROPI)     Status: Abnormal   Collection Time   11/11/11 10:40 AM      Component Value Range Comment   Total CK 202 (*) 7 - 177 (U/L)    CK, MB 5.5 (*) 0.3 - 4.0 (ng/mL)    Troponin I 0.32 (*) <0.30 (ng/mL)    Relative Index 2.7 (*) 0.0 - 2.5    BASIC METABOLIC PANEL     Status: Abnormal   Collection Time   11/11/11 11:20 AM      Component Value Range Comment   Sodium 134 (*) 135 - 145 (mEq/L)    Potassium 5.5 (*) 3.5 - 5.1 (mEq/L)    Chloride 98  96 - 112 (mEq/L)    CO2 28  19 - 32 (mEq/L)    Glucose, Bld 106 (*) 70 - 99 (mg/dL)    BUN 29 (*) 6 - 23 (mg/dL)    Creatinine, Ser 0.45  0.50 - 1.10 (mg/dL)    Calcium 9.1  8.4 - 10.5 (mg/dL)    GFR calc non Af Amer 54 (*) >90 (mL/min)    GFR calc Af Amer 62 (*) >90 (mL/min)   PRO B NATRIURETIC PEPTIDE     Status: Abnormal   Collection Time   11/11/11 11:20 AM      Component Value Range Comment   Pro B Natriuretic peptide (BNP) 4129.0 (*) 0 - 125 (pg/mL)   BLOOD GAS, ARTERIAL     Status: Abnormal    Collection Time   11/11/11 11:45 AM      Component Value Range Comment   O2 Content 3.0      Delivery systems NASAL CANNULA  pH, Arterial 7.261 (*) 7.350 - 7.400     pCO2 arterial 65.1 (*) 35.0 - 45.0 (mmHg)    pO2, Arterial 80.7  80.0 - 100.0 (mmHg)    Bicarbonate 28.3 (*) 20.0 - 24.0 (mEq/L)    TCO2 26.7  0 - 100 (mmol/L)    Acid-Base Excess 0.5  0.0 - 2.0 (mmol/L)    O2 Saturation 94.7      Patient temperature 98.6      Collection site LEFT BRACHIAL      Drawn by [id number]      Sample type ARTERIAL DRAW      Allens test (pass/fail) PASS  PASS     Dg Chest Port 1 View  11/10/2011  *RADIOLOGY REPORT*  Clinical Data: Shortness of breath, asthma, hypertension.  PORTABLE CHEST - 1 VIEW  Comparison: 08/01/2011  Findings: Cardiomegaly with vascular congestion.  Diffuse interstitial prominence throughout the lungs, similar to prior study.  This could reflect mild interstitial edema or chronic interstitial lung disease.  Recommend clinical correlation.  No pleural effusions.  No acute bony abnormality.  IMPRESSION: Cardiomegaly with stable interstitial prominence, recurrent interstitial edema versus chronic lung disease.  Original Report Authenticated By: Cyndie Chime, M.D.    ROS: General:+ cough productive cough yellow sputum , no fever or chills Skin:no rashes HEENT:no visual changed CV:no chest pain PUL:+ SOB  HQ:IONGEX indigestion GU:no dysuria MS:no difficulty walking Neuro:no syncope Endo:no diabetes   Blood pressure 98/48, pulse 85, temperature 98 F (36.7 C), temperature source Oral, resp. rate 14, weight 56.7 kg (125 lb), SpO2 93.00%. PE: General: Hoarse; NAD Skin: mildly edematous HEENT:[place]/AT Neck: JVD 7 cm Heart:RRR 2/6 sem no AR Lungs: diffuse rhochi and wheezing BMW:UXLK central adiposity, nontender GMW:NUUVO edema lower extremities, arms and hands mildly edematous. Neuro: grossly nonfocal  Assessment/Plan Patient Active Problem List  Diagnoses  . Altered  mental status  . CHF (congestive heart failure)  . COPD (chronic obstructive pulmonary disease)  . Hypercapnemia  . Non-STEMI (non-ST elevated myocardial infarction)  . Mild aortic stenosis  . CAD (coronary artery disease)  . Hyperlipidemia  . Tobacco abuse  . S/P cholecystectomy  . NSVT (nonsustained ventricular tachycardia)  . Ascites  . Abnormal LFTs  . Shortness of breath  . Hyperkalemia  . Swelling of extremity, right  . Respiratory acidosis  . ARF (acute renal failure)   PLAN: BP continues 98/48, -115/60  SR  SpO2 93-97%  On venturi mask.  INGOLD,LAURA R 11/11/2011, 1:13 PM   Patient seen and examined. Agree with assessment and plan. 66 yo WF followed by Dr. Rennis Golden.  She has COPD, documented NlLV Fxn with diastolic dysfunction, aortic valve sclerosis, and previous nonischemic nuclear scan x2. She presents now with COPD exacerbation with prob mild CHF.  BNP elevated at 4129; suspect mild increased troponin secondary to CHF rather that MI. With elevated K, d/c enalapril. Increase diuresis. For lung scan with mildly positive d-dimer. ECG without acute STT changes and mild RV conduction delay.   Lennette Bihari, MD, [facility] 11/11/2011 1:48 PM

## 2011-11-11 NOTE — Progress Notes (Signed)
ANTIBIOTIC CONSULT NOTE - INITIAL  Pharmacy Consult for Vancomycin Indication: RUE cellulitis  Allergies  Allergen Reactions  . Ceftriaxone Other (See Comments)    "Blow up like a balloon"    Patient Measurements: Weight: 125 lb (56.7 kg) Adjusted Body Weight:  Vital Signs: Temp: 98.2 F (36.8 C) (02/14 0530) Temp src: Oral (02/14 0530) BP: 115/60 mmHg (02/14 0630) Pulse Rate: 85  (02/14 0630) Labs:  Basename 11/10/11 1400  WBC 8.0  HGB 12.6  PLT 312  LABCREA --  CREATININE 1.84*   The CrCl is unknown because both a height and weight (above a minimum accepted value) are required for this calculation. No results found for this basename: VANCOTROUGH:2,VANCOPEAK:2,VANCORANDOM:2,GENTTROUGH:2,GENTPEAK:2,GENTRANDOM:2,TOBRATROUGH:2,TOBRAPEAK:2,TOBRARND:2,AMIKACINPEAK:2,AMIKACINTROU:2,AMIKACIN:2, in the last 72 hours   Microbiology: No results found for this or any previous visit (from the past 720 hour(s)).  Medical History: Past Medical History  Diagnosis Date  . Angina   . Heart murmur   . Asthma   . Shortness of breath   . Cancer of breast unknown    RT Breast  . Depressed   . Coronary artery disease 2006    2 stents w/previous MI  . Hypertension   . CHF (congestive heart failure)   . Migraine   . Anxiety   . Myocardial infarction 2006  . COPD (chronic obstructive pulmonary disease)     Medications:  Anti-infectives     Start     Dose/Rate Route Frequency Ordered Stop   11/11/11 1200   vancomycin (VANCOCIN) 500 mg in sodium chloride 0.9 % 100 mL IVPB        500 mg 100 mL/hr over 60 Minutes Intravenous Every 24 hours 11/11/11 1122     11/11/11 1115   vancomycin (VANCOCIN) 1,250 mg in sodium chloride 0.9 % 250 mL IVPB  Status:  Discontinued        1,250 mg 166.7 mL/hr over 90 Minutes Intravenous Every 12 hours 11/11/11 1105 11/11/11 1121   11/10/11 2200   amoxicillin-clavulanate (AUGMENTIN) 500-125 MG per tablet 500 mg  Status:  Discontinued        1  tablet Oral 3 times daily 11/10/11 2110 11/11/11 1105         Assessment:  66 yo F admit with SOB, possible ACS,  and hx of CAD and CHF  Swelling, warmth, reddness of RUE may be cellulitis  Renal insufficiency with CrCl ~ 27 ml/min  Goal of Therapy:  Vancomycin trough level 10-15 mcg/ml  Plan:  Vancomycin 500mg  IV Q 24 hours Measure antibiotic drug levels at steady state Follow up culture results, renal function, and labs as available.    Lynann Beaver PharmD, BCPS   Pager [phone] 11/11/2011 11:18 AM

## 2011-11-11 NOTE — Progress Notes (Signed)
VASCULAR LAB PRELIMINARY  PRELIMINARY  PRELIMINARY  PRELIMINARY  Right upper extremity venous duplex completed.    Preliminary report:  Right:  No evidence of DVT or superficial thrombosis.    Terance Hart, RVT 11/11/2011, 9:49 AM

## 2011-11-11 NOTE — ED Notes (Signed)
Pt. Returned from lung scan.  Tolerated procedure well.

## 2011-11-12 DIAGNOSIS — I272 Pulmonary hypertension, unspecified: Secondary | ICD-10-CM | POA: Diagnosis present

## 2011-11-12 DIAGNOSIS — I279 Pulmonary heart disease, unspecified: Secondary | ICD-10-CM

## 2011-11-12 DIAGNOSIS — J9610 Chronic respiratory failure, unspecified whether with hypoxia or hypercapnia: Secondary | ICD-10-CM

## 2011-11-12 DIAGNOSIS — J449 Chronic obstructive pulmonary disease, unspecified: Secondary | ICD-10-CM

## 2011-11-12 HISTORY — PX: TRANSTHORACIC ECHOCARDIOGRAM: SHX275

## 2011-11-12 LAB — COMPREHENSIVE METABOLIC PANEL
ALT: 45 U/L — ABNORMAL HIGH (ref 0–35)
AST: 30 U/L (ref 0–37)
Albumin: 2.6 g/dL — ABNORMAL LOW (ref 3.5–5.2)
Alkaline Phosphatase: 148 U/L — ABNORMAL HIGH (ref 39–117)
BUN: 28 mg/dL — ABNORMAL HIGH (ref 6–23)
CO2: 30 mEq/L (ref 19–32)
Calcium: 9.1 mg/dL (ref 8.4–10.5)
Chloride: 99 mEq/L (ref 96–112)
Creatinine, Ser: 0.96 mg/dL (ref 0.50–1.10)
GFR calc Af Amer: 70 mL/min — ABNORMAL LOW (ref >90)
GFR calc non Af Amer: 61 mL/min — ABNORMAL LOW (ref >90)
Glucose, Bld: 119 mg/dL — ABNORMAL HIGH (ref 70–99)
Potassium: 5.5 mEq/L — ABNORMAL HIGH (ref 3.5–5.1)
Sodium: 132 mEq/L — ABNORMAL LOW (ref 135–145)
Total Bilirubin: 0.1 mg/dL — ABNORMAL LOW (ref 0.3–1.2)
Total Protein: 6.6 g/dL (ref 6.0–8.3)

## 2011-11-12 LAB — DIFFERENTIAL
Basophils Absolute: 0 10*3/uL (ref 0.0–0.1)
Basophils Relative: 0 % (ref 0–1)
Eosinophils Absolute: 0 10*3/uL (ref 0.0–0.7)
Eosinophils Relative: 0 % (ref 0–5)
Lymphocytes Relative: 7 % — ABNORMAL LOW (ref 12–46)
Lymphs Abs: 0.6 10*3/uL — ABNORMAL LOW (ref 0.7–4.0)
Monocytes Absolute: 0.4 10*3/uL (ref 0.1–1.0)
Monocytes Relative: 4 % (ref 3–12)
Neutro Abs: 7.5 10*3/uL (ref 1.7–7.7)
Neutrophils Relative %: 89 % — ABNORMAL HIGH (ref 43–77)

## 2011-11-12 LAB — CBC
HCT: 39.2 % (ref 36.0–46.0)
Hemoglobin: 11.3 g/dL — ABNORMAL LOW (ref 12.0–15.0)
MCH: 25.1 pg — ABNORMAL LOW (ref 26.0–34.0)
MCHC: 28.8 g/dL — ABNORMAL LOW (ref 30.0–36.0)
MCV: 87.1 fL (ref 78.0–100.0)
Platelets: 244 10*3/uL (ref 150–400)
RBC: 4.5 MIL/uL (ref 3.87–5.11)
RDW: 18.7 % — ABNORMAL HIGH (ref 11.5–15.5)
WBC: 8.4 10*3/uL (ref 4.0–10.5)

## 2011-11-12 LAB — CARDIAC PANEL(CRET KIN+CKTOT+MB+TROPI)
CK, MB: 5.5 ng/mL — ABNORMAL HIGH (ref 0.3–4.0)
Relative Index: 3.5 — ABNORMAL HIGH (ref 0.0–2.5)
Total CK: 155 U/L (ref 7–177)
Troponin I: <0.3 ng/mL (ref <0.30)

## 2011-11-12 MED ORDER — ASPIRIN 325 MG PO TABS
325.0000 mg | ORAL_TABLET | Freq: Every day | ORAL | Status: DC
  Administered 2011-11-12 – 2011-11-15 (×4): 325 mg via ORAL
  Filled 2011-11-12 (×4): qty 1

## 2011-11-12 MED ORDER — FLUTICASONE PROPIONATE HFA 110 MCG/ACT IN AERO
2.0000 | INHALATION_SPRAY | Freq: Two times a day (BID) | RESPIRATORY_TRACT | Status: DC
  Administered 2011-11-12: 2 via RESPIRATORY_TRACT
  Filled 2011-11-12: qty 12

## 2011-11-12 MED ORDER — ENOXAPARIN SODIUM 60 MG/0.6ML ~~LOC~~ SOLN
60.0000 mg | Freq: Two times a day (BID) | SUBCUTANEOUS | Status: DC
  Administered 2011-11-12: 60 mg via SUBCUTANEOUS
  Filled 2011-11-12 (×2): qty 0.6

## 2011-11-12 MED ORDER — VANCOMYCIN HCL 1000 MG IV SOLR
1250.0000 mg | INTRAVENOUS | Status: DC
  Administered 2011-11-13: 1250 mg via INTRAVENOUS
  Filled 2011-11-12 (×2): qty 1250

## 2011-11-12 MED ORDER — SODIUM POLYSTYRENE SULFONATE 15 GM/60ML PO SUSP
30.0000 g | Freq: Once | ORAL | Status: AC
  Administered 2011-11-12: 30 g via ORAL
  Filled 2011-11-12: qty 120

## 2011-11-12 MED ORDER — ENOXAPARIN SODIUM 40 MG/0.4ML ~~LOC~~ SOLN
40.0000 mg | SUBCUTANEOUS | Status: DC
  Administered 2011-11-12 – 2011-11-14 (×3): 40 mg via SUBCUTANEOUS
  Filled 2011-11-12 (×4): qty 0.4

## 2011-11-12 MED ORDER — FLUTICASONE-SALMETEROL 250-50 MCG/DOSE IN AEPB
1.0000 | INHALATION_SPRAY | Freq: Two times a day (BID) | RESPIRATORY_TRACT | Status: DC
  Administered 2011-11-12: 1 via RESPIRATORY_TRACT
  Filled 2011-11-12: qty 14

## 2011-11-12 MED ORDER — SILDENAFIL CITRATE 20 MG PO TABS
20.0000 mg | ORAL_TABLET | Freq: Three times a day (TID) | ORAL | Status: DC
  Administered 2011-11-12 – 2011-11-13 (×3): 20 mg via ORAL
  Filled 2011-11-12 (×5): qty 1

## 2011-11-12 MED ORDER — TIOTROPIUM BROMIDE MONOHYDRATE 18 MCG IN CAPS
18.0000 ug | ORAL_CAPSULE | Freq: Every day | RESPIRATORY_TRACT | Status: DC
  Filled 2011-11-12: qty 5

## 2011-11-12 MED ORDER — METHYLPREDNISOLONE SODIUM SUCC 40 MG IJ SOLR
40.0000 mg | Freq: Three times a day (TID) | INTRAMUSCULAR | Status: DC
  Administered 2011-11-12 – 2011-11-14 (×5): 40 mg via INTRAVENOUS
  Filled 2011-11-12 (×8): qty 1

## 2011-11-12 NOTE — Progress Notes (Signed)
[name]  XBJ:[id number]  DOB: Dec 22, 1945  DOA: 11/10/2011  PCP: No primary provider on file.  Subjective: Shortness of breath improving, denies any chest pain or abdominal pain  Objective: Weight change: 9.888 kg (21 lb 12.8 oz)  Intake/Output Summary (Last 24 hours) at 11/12/11 1526 Last data filed at 11/12/11 1100  Gross per 24 hour  Intake    360 ml  Output    550 ml  Net   -190 ml   Blood pressure 124/71, pulse 72, temperature 98 F (36.7 C), temperature source Oral, resp. rate 20, height 5\' 2"  (1.575 m), weight 65.273 kg (143 lb 14.4 oz), SpO2 92.00%.  Physical Exam: General: Alert and awake, oriented x3, not in any acute distress. HEENT: anicteric sclera, pupils reactive to light and accommodation, EOMI CVS: S1-S2 clear, no murmur rubs or gallops Chest: Decreased breath sounds throughout lung fields Abdomen: soft nontender, nondistended, normal bowel sounds, no organomegaly Extremities: no cyanosis, clubbing. Trace edema noted bilaterally Neuro: Cranial nerves II-XII intact, no focal neurological deficits  Lab Results: Basic Metabolic Panel:  Lab 11/12/11 3086 11/11/11 2110 11/11/11 1120  NA 132* -- 134*  K 5.5* -- 5.5*  CL 99 -- 98  CO2 30 -- 28  GLUCOSE 119* -- 106*  BUN 28* -- 29*  CREATININE 0.96 -- 1.06  CALCIUM 9.1 -- 9.1  MG -- 1.9 --  PHOS -- -- --   Liver Function Tests:  Lab 11/12/11 0306  AST 30  ALT 45*  ALKPHOS 148*  BILITOT 0.1*  PROT 6.6  ALBUMIN 2.6*   CBC:  Lab 11/12/11 0306 11/11/11 2110  WBC 8.4 8.5  NEUTROABS 7.5 --  HGB 11.3* 11.2*  HCT 39.2 37.0  MCV 87.1 87.1  PLT 244 214   Cardiac Enzymes:  Lab 11/12/11 0306 11/11/11 1921 11/11/11 1040  CKTOTAL 155 194* 202*  CKMB 5.5* 5.5* 5.5*  CKMBINDEX -- -- --  TROPONINI <0.30 0.35* 0.32*    Studies/Results: Nm Pulmonary Per & Vent  11/11/2011  *RADIOLOGY REPORT*  Clinical Data:  66 year old female with shortness of breath.  NUCLEAR MEDICINE VENTILATION - PERFUSION LUNG  SCAN    IMPRESSION: Very low probability of acute pulmonary embolism.  Gas trapping.  Original Report Authenticated By: Harley Hallmark, M.D.   Dg Chest Port 1 View  11/10/2011  *RADIOLOGY REPORT*  Clinical Data: Shortness of breath, asthma, hypertension.  PORTABLE CHEST - 1 VIEW  Comparison:   IMPRESSION: Cardiomegaly with stable interstitial prominence, recurrent interstitial edema versus chronic lung disease.  Original Report Authenticated By: Cyndie Chime, M.D.   2-D echocardiogram 11/12/2011 Study Conclusions  - Left ventricle: The cavity size was normal. Wall thickness was normal. Systolic function was normal. The estimated ejection fraction was in the range of 55% to 60%. Wall motion was normal; there were no regional wall motion abnormalities. Doppler parameters are consistent with abnormal left ventricular relaxation (grade 1 diastolic dysfunction). - Ventricular septum: There is septal flattening (D-sign) and paradoxical septal motion noted, suggesting both pressure and volume overload of the right ventricle. - Aortic valve: Calcified aortic valve. There is mild aortic stenosis with an AVA of 1.5-1.6 cm2. Peak and mean gradients are 27 and 13 mmHg, respectively, based on an LVOT diameter of 1.9 cm. Valve area: 1.69cm^2(VTI). Valve area: 1.49cm^2 (Vmax). - Mitral valve: There is flat coaptation of the mitral leaflets.MAC. Trace to mild MR. - Left atrium: The atrium was mildly dilated. - Right ventricle: The cavity size was moderately dilated. The moderator  band was prominent. Systolic function was normal. - Right atrium: Moderately dilated. - Pulmonary arteries: PA peak pressure: 76mm Hg (S). This suggests severe pulmonary hypertension. - Inferior vena cava: The vessel was dilated; the respirophasic diameter changes were blunted (< 50%); findings are consistent with elevated central venous pressure. - Pericardium, extracardiac: A trivial pericardial effusion was  identified posterior to the heart.    Medications: Scheduled Meds:   . albuterol  2.5 mg Nebulization Q4H  . anastrozole  1 mg Oral Daily  . aspirin  325 mg Oral Daily  . calcium carbonate  1 tablet Oral Daily  . enoxaparin (LOVENOX) injection  60 mg Subcutaneous Q12H  . fluticasone  2 puff Inhalation BID  . furosemide  40 mg Oral Daily  . ipratropium  0.5 mg Nebulization Q4H  . methylPREDNISolone (SOLU-MEDROL) injection  40 mg Intravenous Q8H  . montelukast  10 mg Oral QHS  . mulitivitamin with minerals  1 tablet Oral Daily  . nortriptyline  25 mg Oral QHS  . omega-3 acid ethyl esters  1 g Oral Daily  . pantoprazole  40 mg Oral Q1200  . sildenafil  20 mg Oral TID  . simvastatin  20 mg Oral Daily  . sodium polystyrene  30 g Oral Once  . vancomycin  1,250 mg Intravenous Q24H  . DISCONTD: enoxaparin (LOVENOX) injection  60 mg Subcutaneous Q24H  . DISCONTD: ipratropium  0.5 mg Nebulization Q6H  . DISCONTD: methylPREDNISolone (SOLU-MEDROL) injection  40 mg Intravenous Q12H  . DISCONTD: omega-3 acid ethyl esters  1 g Oral Daily  . DISCONTD: vancomycin  500 mg Intravenous Q24H   Continuous Infusions:    Assessment/Plan:  Principal Problem:  *Shortness of breath : Multifactorial likely secondary to severe COPD with chronic hypercarbic respiratory failure/respiratory acidosis, severe pulmonary hypertension and ongoing nicotine abuse.  - VQ scan low probability for PE. 2-D echocardiogram done which showed preserved EF with no regional wall motion abnormalities. Patient however has severe pulmonary hypertension. She's not on home O2 however appears to be a with chronic hypercarbia respiratory acidosis on multiple ABGs recently. - Continue scheduled nebulizer treatments, added Flovent, patient did not want powdered form (Advair or Symbicort). Continue IV steroids, was placed on vancomycin by admitting M.D. for questionable right arm cellulitis - continue home dose Lasix and d/c IVF  -  Appreciate cardiology consultation, I have called pulmonology consult for recommendation regarding severe COPD with severe PHTN. Will also do home O2 evaluation closer to the disposition. Cardiology has added sildenafil for PHTN.  Active Problems:  COPD (chronic obstructive pulmonary disease)  - treat with nebulizer PRN and scheduled, antibiotics, steroids  - Home O2 evaluation at the time of disposition - Patient was strongly counseled for nicotine cessation, patient declined patches and stated that she's not interested in discontinuing to smoke.  ACUTE RENAL FAILURE: Resolved   Hyperkalemia: Gave 1 dose of Kayexalate   Swelling of extremity, right: Negative for DVT or superficial thrombosis  - On Vancomycin,  keep extremity elevated   Acute diastolic CHF exacerbation :  - Continue Lasix, I's and O's, appreciate cardiology recommendations   DVT Prophylaxis: Change Lovenox to DVT prophylaxis, patient does not have any ACS, DVT or PE on the imagings   Code Status: Full code   Disposition: not medically ready   LOS: 2 days   Demitris Pokorny M.D. [facility] 11/12/2011, 3:26 PM

## 2011-11-12 NOTE — Progress Notes (Signed)
Subjective:  SOB improving  Objective:  Vital Signs in the last 24 hours: Temp:  [97.8 F (36.6 C)-98.6 F (37 C)] 97.8 F (36.6 C) (02/15 0447) Pulse Rate:  [72-91] 91  (02/15 1138) Resp:  [18-24] 22  (02/15 1138) BP: (97-130)/(45-83) 130/61 mmHg (02/15 1138) SpO2:  [88 %-98 %] 93 % (02/15 1138) FiO2 (%):  [5 %-35 %] 5 % (02/14 1619) Weight:  [65.273 kg (143 lb 14.4 oz)-66.588 kg (146 lb 12.8 oz)] 65.273 kg (143 lb 14.4 oz) (02/15 0657)  Intake/Output from previous day:  Intake/Output Summary (Last 24 hours) at 11/12/11 1241 Last data filed at 11/12/11 1100  Gross per 24 hour  Intake    360 ml  Output    550 ml  Net   -190 ml    Physical Exam: General appearance: alert, cooperative and no distress Lungs: decreased breath sounds throughout both lung fields Heart: regular rate and rhythm and 2/6 sytolic murmur Extrem: Trace edema   Rate: 90  Rhythm: normal sinus rhythm  Lab Results:  Basename 11/12/11 0306 11/11/11 2110  WBC 8.4 8.5  HGB 11.3* 11.2*  PLT 244 214    Basename 11/12/11 0306 11/11/11 1120  NA 132* 134*  K 5.5* 5.5*  CL 99 98  CO2 30 28  GLUCOSE 119* 106*  BUN 28* 29*  CREATININE 0.96 1.06    Basename 11/12/11 0306 11/11/11 1921  TROPONINI <0.30 0.35*   Hepatic Function Panel  Basename 11/12/11 0306  PROT 6.6  ALBUMIN 2.6*  AST 30  ALT 45*  ALKPHOS 148*  BILITOT 0.1*  BILIDIR --  IBILI --    Basename 11/11/11 1120  CHOL 83   No results found for this basename: INR in the last 72 hours  Imaging: Nm Pulmonary Per & Vent  11/11/2011  *RADIOLOGY REPORT*  Clinical Data:  66 year old female with shortness of breath.  NUCLEAR MEDICINE VENTILATION - PERFUSION LUNG SCAN  Technique:  Wash-in, equilibrium, and wash-out phase ventilation images were obtained using Xe-133 gas.  Perfusion images were obtained in multiple projections after intravenous injection of Tc- 39m MAA.  Radiopharmaceuticals:  10.6 mCi Xe-133 gas and 4.4 mCi Tc-47m MAA.   Comparison:  Portable chest radiograph 11/10/2011.  Findings: Comparison chest radiograph demonstrates cardiomegaly and increased interstitial opacities suggestive of vascular congestion.  Ventilation imaging demonstrates no convincing ventilation defect. Persisting gas trapping.  Perfusion imaging demonstrates cardiovascular shadows.  No peripheral or suspicious perfusion defect.  IMPRESSION: Very low probability of acute pulmonary embolism.  Gas trapping.  Original Report Authenticated By: Harley Hallmark, M.D.   Dg Chest Port 1 View  11/10/2011  *RADIOLOGY REPORT*  Clinical Data: Shortness of breath, asthma, hypertension.  PORTABLE CHEST - 1 VIEW  Comparison: 08/01/2011  Findings: Cardiomegaly with vascular congestion.  Diffuse interstitial prominence throughout the lungs, similar to prior study.  This could reflect mild interstitial edema or chronic interstitial lung disease.  Recommend clinical correlation.  No pleural effusions.  No acute bony abnormality.  IMPRESSION: Cardiomegaly with stable interstitial prominence, recurrent interstitial edema versus chronic lung disease.  Original Report Authenticated By: Cyndie Chime, M.D.    Cardiac Studies:  Assessment/Plan:   Principal Problem:  *Shortness of breath  Active Problems:  CHF,  acute diastolic, BNP 4k on admissio  COPD (chronic obstructive pulmonary disease), on ABs  Hyperkalemia, on ACE prior to admission, d/c'd  Respiratory acidosis  Hypercapnemia  Mild aortic stenosis, 2D 9/12  CAD (coronary artery disease), MI R/O  Hyperlipidemia  Tobacco  abuse  Plan-MD to see. Leave off ACE, continue Lasix 40mg  po daily. Echo results pending.          Resume ASA.   Corine Shelter PA-C 11/12/2011, 12:41 PM

## 2011-11-12 NOTE — Progress Notes (Addendum)
Pt. Seen and examined. Agree with the NP/PA-C note as written.  Dyspnea which has improved slightly. I reviewed her echo today, which is entirely consistent with severe pulmonary hypertension (LV filling pressures suggest more likely pulmonary arterial rather than pulmonary venous). PA pressures are greater than 75 systolic. There are signs of volume and pressure overload of the the right ventricle. I agree with Dr. Tresa Endo, the troponin elevation is not likely due to ACS, rather to her pulmonary hypertension and RV overload.  V/Q is low probability for PE, however, she is at high risk due to severe pulmonary hypertension.  There is mild aortic stenosis, however, preserved LV systolic function.  I would consider adding revatio. Long-term anticoagulation with warfarin should also be considered. Will ask pulmonary to weigh-in as well regarding her COPD and pulmonary hypertension.  Chrystie Nose, MD, [facility] Attending Cardiologist [facility]

## 2011-11-12 NOTE — Progress Notes (Signed)
CARE MANAGEMENT NOTE 11/12/2011  Patient:  [name], [name]   Account Number:  [id number]  Date Initiated:  11/12/2011  Documentation initiated by:  Renesme Kerrigan  Subjective/Objective Assessment:   increased copd symptoms despite op treatment at home and upon arrival     Action/Plan:   lives at home   Anticipated DC Date:  11/15/2011   Anticipated DC Plan:  HOME/SELF CARE         Choice offered to / List presented to:             Status of service:  In process, will continue to follow Medicare Important Message given?   (If response is "NO", the following Medicare IM given date fields will be blank) Date Medicare IM given:   Date Additional Medicare IM given:    Discharge Disposition:    Per UR Regulation:  Reviewed for med. necessity/level of care/duration of stay  Comments:  [id number]/Jyles Sontag,RN,BSN,CCM

## 2011-11-12 NOTE — Consult Note (Signed)
Name: [name] MRN: [id number] DOB: [birth year]    LOS: 2  PCCM ADMISSION NOTE  Brief patient profile: : 66 yowf smoker on chronic 02 under pulmonologist's care until came to [facility] May 2012 and no 02 since admit 2/13 with aecopd with severe PAH by echo so pulmonary asked to see by Dr Rennis Golden re ? revatio rx   HPI Sob progressively worse 48 hours prior to presenting to the emergency room. Shortness of breath is said to be associated with cough that is productive of yellowish sputum. Denies any associated chest pain. No palpitations. No nausea or vomiting. Patient denies any PND or orthopnea. She denies any fever, chills or Rigors. Symptoms are said to be getting progressively worse hence patient presented to the emergency room to be evaluated and admit by [facility].  Baseline = [employer] slow s 02, congested cough worse in am's with slt yellow sputum  In general Sleeping ok without nocturnal  or early am exacerbation  of respiratory  c/o's or need for noct saba. Also denies any obvious fluctuation of symptoms with weather or environmental changes or other aggravating or alleviating factors except as outlined above   ROS  At present neg for  any significant sore throat, dysphagia, itching, sneezing,  nasal congestion or excess/ purulent secretions,  fever, chills, sweats, unintended wt loss, pleuritic or exertional cp, hempoptysis, orthopnea pnd or leg swelling.  Also denies presyncope, palpitations, heartburn, abdominal pain, nausea, vomiting, diarrhea  or change in bowel or urinary habits, dysuria,hematuria,  rash, arthralgias, visual complaints, headache, numbness weakness or ataxia.      Past Medical History  Diagnosis Date  . Angina   . Heart murmur   . Asthma   . Shortness of breath   . Cancer of breast unknown    RT Breast  . Depressed   . Coronary artery disease 2006    2 stents w/previous MI  . Hypertension   . CHF (congestive heart failure)   . Migraine   . Anxiety   . Myocardial  infarction 2006  . COPD (chronic obstructive pulmonary disease)    Past Surgical History  Procedure Date  . Breast lumpectomy     Right  . Cardiac catheterization 2006   Prior to Admission medications   Medication Sig Start Date End Date Taking? Authorizing Provider  albuterol (PROVENTIL HFA;VENTOLIN HFA) 108 (90 BASE) MCG/ACT inhaler Inhale 2 puffs into the lungs every 4 (four) hours as needed. Shortness of breath and wheezing    Yes Historical Provider, MD  ALPRAZolam Prudy Feeler) 1 MG tablet Take 1 mg by mouth 3 (three) times daily as needed. For anxitey    Yes Historical Provider, MD  anastrozole (ARIMIDEX) 1 MG tablet Take 1 mg by mouth daily.     Yes Historical Provider, MD  aspirin 325 MG tablet Take 325 mg by mouth daily.     Yes Historical Provider, MD  atorvastatin (LIPITOR) 80 MG tablet Take 80 mg by mouth at bedtime.     Yes Historical Provider, MD  butalbital-acetaminophen-caffeine (FIORICET, ESGIC) 50-325-40 MG per tablet Take 1 tablet by mouth every 4 (four) hours as needed. For headaches    Yes Historical Provider, MD  calcium carbonate (TUMS - DOSED IN MG ELEMENTAL CALCIUM) 500 MG chewable tablet Chew 1 tablet by mouth daily.     Yes Historical Provider, MD  diclofenac sodium (VOLTAREN) 1 % GEL Apply 1 application topically daily as needed. Hand pain   Yes Historical Provider, MD  enalapril (VASOTEC) 5  MG tablet Take 5 mg by mouth daily.     Yes Historical Provider, MD  fish oil-omega-3 fatty acids 1000 MG capsule Take 1 g by mouth daily.     Yes Historical Provider, MD  fluticasone (FLOVENT HFA) 220 MCG/ACT inhaler Inhale 2 puffs into the lungs 2 (two) times daily as needed. For shortness of breath    Yes Historical Provider, MD  furosemide (LASIX) 40 MG tablet Take 1 tablet (40 mg total) by mouth daily. 08/04/11 08/03/12 Yes Isidor Holts, MD  ipratropium (ATROVENT HFA) 17 MCG/ACT inhaler Inhale 2 puffs into the lungs every 4 (four) hours as needed. For shortness of breath    Yes  Historical Provider, MD  ipratropium-albuterol (DUONEB) 0.5-2.5 (3) MG/3ML SOLN Take 3 mLs by nebulization every 6 (six) hours as needed. For shortness of breath    Yes Historical Provider, MD  lactulose (CHRONULAC) 10 GM/15ML solution Take 30 g by mouth as needed.     Yes Historical Provider, MD  montelukast (SINGULAIR) 10 MG tablet Take 10 mg by mouth at bedtime.     Yes Historical Provider, MD  Multiple Vitamins-Minerals (MULTIVITAMIN WITH MINERALS) tablet Take 1 tablet by mouth daily.   Yes Historical Provider, MD  nortriptyline (PAMELOR) 25 MG capsule Take 25 mg by mouth at bedtime.     Yes Historical Provider, MD  omega-3 acid ethyl esters (LOVAZA) 1 G capsule Take 1 capsule (1 g total) by mouth daily. 08/04/11 08/03/12 Yes Isidor Holts, MD  pantoprazole (PROTONIX) 40 MG tablet Take 40 mg by mouth daily.   Yes Historical Provider, MD  potassium chloride (KLOR-CON) 10 MEQ CR tablet Take 20 mEq by mouth daily.     Yes Historical Provider, MD   Allergies Allergies  Allergen Reactions  . Ceftriaxone Other (See Comments)    "Blow up like a balloon"    Family History History reviewed. No pertinent family history.  Social History  reports that she has been smoking Cigarettes.  She has smoked for the past 47 years. She has never used smokeless tobacco. She reports that she drinks alcohol. She reports that she does not use illicit drugs.  Review Of Systems  Patient unable to provide  Vital Signs: Temp:  [97.8 F (36.6 C)-98.6 F (37 C)] 98 F (36.7 C) (02/15 1409) Pulse Rate:  [72-91] 72  (02/15 1409) Resp:  [18-22] 20  (02/15 1409) BP: (97-130)/(45-83) 124/71 mmHg (02/15 1409) SpO2:  [88 %-98 %] 96 % (02/15 1624) Weight:  [143 lb 14.4 oz (65.273 kg)-146 lb 12.8 oz (66.588 kg)] 143 lb 14.4 oz (65.273 kg) (02/15 0657)    Physical Examination: Elderly hoarse wf nad on 02 3lpm at 96% HEENT mild turbinate edema.  Oropharynx no thrush or excess pnd or cobblestoning.  No JVD or  cervical adenopathy. Mild accessory muscle hypertrophy. Trachea midline, nl thryroid. Chest was hyperinflated by percussion with diminished breath sounds and moderate increased exp time without wheeze. Hoover sign positive at mid inspiration. Regular rate and rhythm without murmur gallop or rub or increase P2 or edema.  Abd: no hsm, nl excursion. Ext warm without cyanosis or clubbing.        Labs   Lab 11/12/11 0306 11/11/11 1120 11/10/11 1730 11/10/11 1400  NA 132* 134* -- 136  K 5.5* 5.5* 6.2* --  CL 99 98 -- 95*  CO2 30 28 -- 31  BUN 28* 29* -- 35*  CREATININE 0.96 1.06 -- 1.84*  GLUCOSE 119* 106* -- 96  Lab 11/12/11 0306 11/11/11 2110 11/10/11 1400  HGB 11.3* 11.2* 12.6  HCT 39.2 37.0 41.9  WBC 8.4 8.5 8.0  PLT 244 214 312      ASSESSMENT AND PLAN     PULMONARY  Lab 11/11/11 1145 11/10/11 1522  PHART 7.261* 7.216*  PCO2ART 65.1* 79.3*  PO2ART 80.7 307.0*  HCO3 28.3* 31.0*  O2SAT 94.7 99.6   A:  Acute on chronic hypoxemic and hypercarbic resp failure with secondary PAH (cor pulmonale).   Needs No smoking 24 hour 02  Rx copd as you are F/u with Dr Delton Coombes  CARDIOVASCULAR ACE inhibitors are problematic in  pts with airway complaints because  even experienced pulmonologists can't always distinguish ace effects from copd/asthma.  By themselves they don't actually cause a problem, much like oxygen can't by itself start a fire, but they certainly serve as a powerful catalyst or enhancer for any "fire"  or inflammatory process in the upper airway, be it caused by an ET  tube or more commonly reflux (especially in the obese or pts with known GERD or who are on biphoshonates).    In the era of ARB near equivalency until we have a better handle on the reversibility of the airway problem, it just makes sense to avoid ACEI  entirely in the short run and then decide later, having established a level of airway control using a reasonable limited regimen, whether to add back ace but  even then being very careful to observe the pt for worsening airway control and number of meds used/ needed to control symptoms.   I wouldn't use acei in Rt heart failure due to copd because it muddies the water in terms of symptom response   RENAL  Lab 11/12/11 0306 11/11/11 2110 11/11/11 1120 11/10/11 1400  NA 132* -- 134* 136  K 5.5* -- 5.5* --  CL 99 -- 98 95*  CO2 30 -- 28 31  BUN 28* -- 29* 35*  CREATININE 0.96 -- 1.06 1.84*  CALCIUM 9.1 -- 9.1 9.5  MG -- 1.9 -- --  PHOS -- -- -- --   A:  Hyperkalemia on ACEI, rec d/c permanently   Sandrea Hughs, MD Pulmonary and Critical Care Medicine [facility] [phone]

## 2011-11-12 NOTE — Progress Notes (Signed)
ANTIBIOTIC / ANTICOAGULATION CONSULT NOTE - INITIAL  Pharmacy Consult for Vancomycin / Lovenox  Indication: RUE cellulitis / Chest Pain/ACS  Allergies  Allergen Reactions  . Ceftriaxone Other (See Comments)    "Blow up like a balloon"    Patient Measurements: Height: 5\' 2"  (157.5 cm) Weight: 143 lb 14.4 oz (65.273 kg) IBW/kg (Calculated) : 50.1   Vital Signs: Temp: 97.8 F (36.6 C) (02/15 0447) Temp src: Oral (02/15 0447) BP: 130/61 mmHg (02/15 1138) Pulse Rate: 91  (02/15 1138) Labs:  Basename 11/12/11 0306 11/11/11 2110 11/11/11 1120 11/10/11 1400  WBC 8.4 8.5 -- 8.0  HGB 11.3* 11.2* -- 12.6  PLT 244 214 -- 312  LABCREA -- -- -- --  CREATININE 0.96 -- 1.06 1.84*   Estimated Creatinine Clearance: 51.8 ml/min (by C-G formula based on Cr of 0.96).    Microbiology: No results found for this or any previous visit (from the past 720 hour(s)).  Medical History: Past Medical History  Diagnosis Date  . Angina   . Heart murmur   . Asthma   . Shortness of breath   . Cancer of breast unknown    RT Breast  . Depressed   . Coronary artery disease 2006    2 stents w/previous MI  . Hypertension   . CHF (congestive heart failure)   . Migraine   . Anxiety   . Myocardial infarction 2006  . COPD (chronic obstructive pulmonary disease)     Medications:  Anti-infectives     Start     Dose/Rate Route Frequency Ordered Stop   11/11/11 1200   vancomycin (VANCOCIN) 500 mg in sodium chloride 0.9 % 100 mL IVPB        500 mg 100 mL/hr over 60 Minutes Intravenous Every 24 hours 11/11/11 1122     11/11/11 1115   vancomycin (VANCOCIN) 1,250 mg in sodium chloride 0.9 % 250 mL IVPB  Status:  Discontinued        1,250 mg 166.7 mL/hr over 90 Minutes Intravenous Every 12 hours 11/11/11 1105 11/11/11 1121   11/10/11 2200   amoxicillin-clavulanate (AUGMENTIN) 500-125 MG per tablet 500 mg  Status:  Discontinued        1 tablet Oral 3 times daily 11/10/11 2110 11/11/11 1105          D#2 Vancomycin 500mg  q24h  Assessment:  66 yo F admit with SOB, possible ACS,  and hx of CAD and CHF  Swelling, warmth, erythema of RUE c/w cellulitis  Acute renal insufficiency improving.   Goal of Therapy:   Vancomycin trough level 10-15 mcg/ml  Full-dose Lovenox adjusted for renal function  Plan:   Increase vancomycin to 1250mg  IV Q 24 hours based on improved renal function.  Check Vancomycin trough at steady-state if therapy continues.  Increase Lovenox to 60mg  q12h based on improved renal function.    Follow serum creatinine.  Marijo Conception, Pharmacy Student  11/12/2011 1:36 PM  Elie Goody, PharmD, BCPS Pager: [phone] 11/12/2011  1:53 PM

## 2011-11-12 NOTE — Progress Notes (Signed)
*  PRELIMINARY RESULTS* Echocardiogram 2D Echocardiogram has been performed.  Glean Salen [facility] 11/12/2011, 12:11 PM

## 2011-11-13 LAB — BASIC METABOLIC PANEL
BUN: 18 mg/dL (ref 6–23)
CO2: 39 mEq/L — ABNORMAL HIGH (ref 19–32)
Calcium: 9.1 mg/dL (ref 8.4–10.5)
Chloride: 95 mEq/L — ABNORMAL LOW (ref 96–112)
Creatinine, Ser: 0.69 mg/dL (ref 0.50–1.10)
GFR calc Af Amer: >90 mL/min (ref >90)
GFR calc non Af Amer: 89 mL/min — ABNORMAL LOW (ref >90)
Glucose, Bld: 116 mg/dL — ABNORMAL HIGH (ref 70–99)
Potassium: 4.6 mEq/L (ref 3.5–5.1)
Sodium: 136 mEq/L (ref 135–145)

## 2011-11-13 LAB — CBC
HCT: 42.5 % (ref 36.0–46.0)
Hemoglobin: 12.2 g/dL (ref 12.0–15.0)
MCH: 25.4 pg — ABNORMAL LOW (ref 26.0–34.0)
MCHC: 28.7 g/dL — ABNORMAL LOW (ref 30.0–36.0)
MCV: 88.5 fL (ref 78.0–100.0)
Platelets: 227 10*3/uL (ref 150–400)
RBC: 4.8 MIL/uL (ref 3.87–5.11)
RDW: 18.2 % — ABNORMAL HIGH (ref 11.5–15.5)
WBC: 6.6 10*3/uL (ref 4.0–10.5)

## 2011-11-13 LAB — PRO B NATRIURETIC PEPTIDE: Pro B Natriuretic peptide (BNP): 4521 pg/mL — ABNORMAL HIGH (ref 0–125)

## 2011-11-13 MED ORDER — FUROSEMIDE 10 MG/ML IJ SOLN
40.0000 mg | Freq: Two times a day (BID) | INTRAMUSCULAR | Status: DC
  Administered 2011-11-13 – 2011-11-14 (×2): 40 mg via INTRAVENOUS
  Filled 2011-11-13 (×3): qty 4

## 2011-11-13 MED ORDER — SULFAMETHOXAZOLE-TMP DS 800-160 MG PO TABS
1.0000 | ORAL_TABLET | Freq: Two times a day (BID) | ORAL | Status: DC
  Administered 2011-11-14 – 2011-11-15 (×4): 1 via ORAL
  Filled 2011-11-13 (×5): qty 1

## 2011-11-13 MED ORDER — FLUTICASONE PROPIONATE HFA 110 MCG/ACT IN AERO
2.0000 | INHALATION_SPRAY | Freq: Two times a day (BID) | RESPIRATORY_TRACT | Status: DC
  Administered 2011-11-13 – 2011-11-15 (×4): 2 via RESPIRATORY_TRACT
  Filled 2011-11-13: qty 12

## 2011-11-13 NOTE — Progress Notes (Signed)
[facility]  DAILY PROGRESS NOTE   Subjective:  She reports flushing, anxiety and restlessness with the revatio.  Breathing is improved somewhat today.   Objective:  Temp:  [97.9 F (36.6 C)-98.2 F (36.8 C)] 97.9 F (36.6 C) (02/16 1610) Pulse Rate:  [72-89] 76  (02/16 0613) Resp:  [16-20] 16  (02/16 0613) BP: (124-136)/(67-75) 133/67 mmHg (02/16 0613) SpO2:  [92 %-98 %] 98 % (02/16 0916) Weight:  [65.137 kg (143 lb 9.6 oz)] 65.137 kg (143 lb 9.6 oz) (02/16 9604) Weight change: -1.452 kg (-3 lb 3.2 oz)  Intake/Output from previous day: 02/15 0701 - 02/16 0700 In: 1130 [P.O.:880; IV Piggyback:250] Out: 1500 [Urine:1500]  Intake/Output from this shift:    Medications: Current Facility-Administered Medications  Medication Dose Route Frequency Provider Last Rate Last Dose  . albuterol (PROVENTIL) (5 MG/ML) 0.5% nebulizer solution 2.5 mg  2.5 mg Nebulization Q4H Iskra Magick-Myers, MD   2.5 mg at 11/13/11 0913  . albuterol (PROVENTIL) (5 MG/ML) 0.5% nebulizer solution 2.5 mg  2.5 mg Nebulization Q2H PRN Iskra Magick-Myers, MD      . ALPRAZolam Prudy Feeler) tablet 1 mg  1 mg Oral TID PRN Talmage Nap, MD   1 mg at 11/12/11 2104  . anastrozole (ARIMIDEX) tablet 1 mg  1 mg Oral Daily Talmage Nap, MD   1 mg at 11/13/11 1000  . aspirin tablet 325 mg  325 mg Oral Daily Eda Paschal [place], [place]   325 mg at 11/13/11 1000  . butalbital-acetaminophen-caffeine (FIORICET, ESGIC) 50-325-40 MG per tablet 1 tablet  1 tablet Oral Q4H PRN Talmage Nap, MD   1 tablet at 11/13/11 0350  . calcium carbonate (TUMS - dosed in mg elemental calcium) chewable tablet 200 mg of elemental calcium  1 tablet Oral Daily Talmage Nap, MD   200 mg of elemental calcium at 11/13/11 1000  . diclofenac sodium (VOLTAREN) 1 % transdermal gel 1 application  1 application Topical Daily PRN Talmage Nap, MD      . enoxaparin (LOVENOX) injection 40 mg  40 mg Subcutaneous Q24H  Ripudeep Jenna Luo, MD   40 mg at 11/12/11 2105  . Fluticasone-Salmeterol (ADVAIR) 250-50 MCG/DOSE inhaler 1 puff  1 puff Inhalation BID Sandrea Hughs, MD   1 puff at 11/12/11 2108  . furosemide (LASIX) tablet 40 mg  40 mg Oral Daily Lennette Bihari, MD   40 mg at 11/13/11 1000  . methylPREDNISolone sodium succinate (SOLU-MEDROL) 40 MG injection 40 mg  40 mg Intravenous Q8H Ripudeep K Rai, MD   40 mg at 11/13/11 0825  . montelukast (SINGULAIR) tablet 10 mg  10 mg Oral QHS Talmage Nap, MD   10 mg at 11/11/11 2126  . mulitivitamin with minerals tablet 1 tablet  1 tablet Oral Daily Talmage Nap, MD   1 tablet at 11/13/11 1000  . nortriptyline (PAMELOR) capsule 25 mg  25 mg Oral QHS Talmage Nap, MD   25 mg at 11/11/11 2125  . omega-3 acid ethyl esters (LOVAZA) capsule 1 g  1 g Oral Daily Talmage Nap, MD   1 g at 11/13/11 1000  . pantoprazole (PROTONIX) EC tablet 40 mg  40 mg Oral Q1200 Talmage Nap, MD   40 mg at 11/12/11 1326  . sildenafil (REVATIO) tablet 20 mg  20 mg Oral TID Chrystie Nose, MD   20 mg at 11/13/11 1000  . simvastatin (ZOCOR) tablet 20 mg  20 mg Oral Daily Talmage Nap, MD   20  mg at 11/13/11 1000  . sodium polystyrene (KAYEXALATE) 15 GM/60ML suspension 30 g  30 g Oral Once Ripudeep Jenna Luo, MD   30 g at 11/12/11 1822  . tiotropium (SPIRIVA) inhalation capsule 18 mcg  18 mcg Inhalation Daily Sandrea Hughs, MD      . vancomycin (VANCOCIN) 1,250 mg in sodium chloride 0.9 % 250 mL IVPB  1,250 mg Intravenous Q24H Ripudeep Jenna Luo, MD   1,250 mg at 11/13/11 0347  . DISCONTD: enoxaparin (LOVENOX) injection 60 mg  60 mg Subcutaneous Q12H Ripudeep K Rai, MD   60 mg at 11/12/11 1327  . DISCONTD: fluticasone (FLOVENT HFA) 110 MCG/ACT inhaler 2 puff  2 puff Inhalation BID Ripudeep Jenna Luo, MD   2 puff at 11/12/11 1011  . DISCONTD: ipratropium (ATROVENT) nebulizer solution 0.5 mg  0.5 mg Nebulization Q4H Ripudeep K Rai, MD   0.5 mg at 11/12/11 1623  . DISCONTD: vancomycin  (VANCOCIN) 500 mg in sodium chloride 0.9 % 100 mL IVPB  500 mg Intravenous Q24H Jodelle Gross, PHARMD   500 mg at 11/12/11 1326    Physical Exam: General appearance: alert and no distress Neck: JVD - 2 cm above sternal notch, no adenopathy, no carotid bruit, supple, symmetrical, trachea midline and thyroid not enlarged, symmetric, no tenderness/mass/nodules Lungs: dullness to percussion bibasilar Heart: regular rate and rhythm, S1, S2 normal, no murmur, click, rub or gallop Abdomen: soft, non-tender; bowel sounds normal; no masses,  no organomegaly Extremities: trace to 1+ edema, bilateral TED hose in place, hands are puffy Pulses: 2+ and symmetric  Lab Results: Results for orders placed during the hospital encounter of 11/10/11 (from the past 48 hour(s))  CARDIAC PANEL(CRET KIN+CKTOT+MB+TROPI)     Status: Abnormal   Collection Time   11/11/11  7:21 PM      Component Value Range Comment   Total CK 194 (*) 7 - 177 (U/L)    CK, MB 5.5 (*) 0.3 - 4.0 (ng/mL)    Troponin I 0.35 (*) <0.30 (ng/mL)    Relative Index 2.8 (*) 0.0 - 2.5    MAGNESIUM     Status: Normal   Collection Time   11/11/11  9:10 PM      Component Value Range Comment   Magnesium 1.9  1.5 - 2.5 (mg/dL)   CBC     Status: Abnormal   Collection Time   11/11/11  9:10 PM      Component Value Range Comment   WBC 8.5  4.0 - 10.5 (K/uL)    RBC 4.25  3.87 - 5.11 (MIL/uL)    Hemoglobin 11.2 (*) 12.0 - 15.0 (g/dL) REPEATED TO VERIFY   HCT 37.0  36.0 - 46.0 (%)    MCV 87.1  78.0 - 100.0 (fL)    MCH 24.5 (*) 26.0 - 34.0 (pg)    MCHC 28.1 (*) 30.0 - 36.0 (g/dL)    RDW 16.1 (*) 09.6 - 15.5 (%)    Platelets 214  150 - 400 (K/uL)   DIFFERENTIAL     Status: Abnormal   Collection Time   11/11/11  9:10 PM      Component Value Range Comment   Neutrophils Relative 81 (*) 43 - 77 (%)    Neutro Abs 6.9  1.7 - 7.7 (K/uL)    Lymphocytes Relative 9 (*) 12 - 46 (%)    Lymphs Abs 0.8  0.7 - 4.0 (K/uL)    Monocytes Relative 10  3  - 12 (%)  Monocytes Absolute 0.8  0.1 - 1.0 (K/uL)    Eosinophils Relative 0  0 - 5 (%)    Eosinophils Absolute 0.0  0.0 - 0.7 (K/uL)    Basophils Relative 0  0 - 1 (%)    Basophils Absolute 0.0  0.0 - 0.1 (K/uL)   CARDIAC PANEL(CRET KIN+CKTOT+MB+TROPI)     Status: Abnormal   Collection Time   11/12/11  3:06 AM      Component Value Range Comment   Total CK 155  7 - 177 (U/L)    CK, MB 5.5 (*) 0.3 - 4.0 (ng/mL)    Troponin I <0.30  <0.30 (ng/mL)    Relative Index 3.5 (*) 0.0 - 2.5    CBC     Status: Abnormal   Collection Time   11/12/11  3:06 AM      Component Value Range Comment   WBC 8.4  4.0 - 10.5 (K/uL)    RBC 4.50  3.87 - 5.11 (MIL/uL)    Hemoglobin 11.3 (*) 12.0 - 15.0 (g/dL)    HCT 16.1  09.6 - 04.5 (%)    MCV 87.1  78.0 - 100.0 (fL)    MCH 25.1 (*) 26.0 - 34.0 (pg)    MCHC 28.8 (*) 30.0 - 36.0 (g/dL)    RDW 40.9 (*) 81.1 - 15.5 (%)    Platelets 244  150 - 400 (K/uL)   COMPREHENSIVE METABOLIC PANEL     Status: Abnormal   Collection Time   11/12/11  3:06 AM      Component Value Range Comment   Sodium 132 (*) 135 - 145 (mEq/L)    Potassium 5.5 (*) 3.5 - 5.1 (mEq/L)    Chloride 99  96 - 112 (mEq/L)    CO2 30  19 - 32 (mEq/L)    Glucose, Bld 119 (*) 70 - 99 (mg/dL)    BUN 28 (*) 6 - 23 (mg/dL)    Creatinine, Ser 9.14  0.50 - 1.10 (mg/dL)    Calcium 9.1  8.4 - 10.5 (mg/dL)    Total Protein 6.6  6.0 - 8.3 (g/dL)    Albumin 2.6 (*) 3.5 - 5.2 (g/dL)    AST 30  0 - 37 (U/L)    ALT 45 (*) 0 - 35 (U/L)    Alkaline Phosphatase 148 (*) 39 - 117 (U/L)    Total Bilirubin 0.1 (*) 0.3 - 1.2 (mg/dL)    GFR calc non Af Amer 61 (*) >90 (mL/min)    GFR calc Af Amer 70 (*) >90 (mL/min)   DIFFERENTIAL     Status: Abnormal   Collection Time   11/12/11  3:06 AM      Component Value Range Comment   Neutrophils Relative 89 (*) 43 - 77 (%)    Neutro Abs 7.5  1.7 - 7.7 (K/uL)    Lymphocytes Relative 7 (*) 12 - 46 (%)    Lymphs Abs 0.6 (*) 0.7 - 4.0 (K/uL)    Monocytes Relative 4  3 -  12 (%)    Monocytes Absolute 0.4  0.1 - 1.0 (K/uL)    Eosinophils Relative 0  0 - 5 (%)    Eosinophils Absolute 0.0  0.0 - 0.7 (K/uL)    Basophils Relative 0  0 - 1 (%)    Basophils Absolute 0.0  0.0 - 0.1 (K/uL)   CBC     Status: Abnormal   Collection Time   11/13/11  7:20 AM      Component  Value Range Comment   WBC 6.6  4.0 - 10.5 (K/uL)    RBC 4.80  3.87 - 5.11 (MIL/uL)    Hemoglobin 12.2  12.0 - 15.0 (g/dL)    HCT 40.9  81.1 - 91.4 (%)    MCV 88.5  78.0 - 100.0 (fL)    MCH 25.4 (*) 26.0 - 34.0 (pg)    MCHC 28.7 (*) 30.0 - 36.0 (g/dL)    RDW 78.2 (*) 95.6 - 15.5 (%)    Platelets 227  150 - 400 (K/uL)   BASIC METABOLIC PANEL     Status: Abnormal   Collection Time   11/13/11  7:20 AM      Component Value Range Comment   Sodium 136  135 - 145 (mEq/L)    Potassium 4.6  3.5 - 5.1 (mEq/L)    Chloride 95 (*) 96 - 112 (mEq/L)    CO2 39 (*) 19 - 32 (mEq/L)    Glucose, Bld 116 (*) 70 - 99 (mg/dL)    BUN 18  6 - 23 (mg/dL)    Creatinine, Ser 2.13  0.50 - 1.10 (mg/dL)    Calcium 9.1  8.4 - 10.5 (mg/dL)    GFR calc non Af Amer 89 (*) >90 (mL/min)    GFR calc Af Amer >90  >90 (mL/min)   PRO B NATRIURETIC PEPTIDE     Status: Abnormal   Collection Time   11/13/11  7:20 AM      Component Value Range Comment   Pro B Natriuretic peptide (BNP) 4521.0 (*) 0 - 125 (pg/mL)     Imaging: Nm Pulmonary Per & Vent  11/11/2011  *RADIOLOGY REPORT*  Clinical Data:  66 year old female with shortness of breath.  NUCLEAR MEDICINE VENTILATION - PERFUSION LUNG SCAN  Technique:  Wash-in, equilibrium, and wash-out phase ventilation images were obtained using Xe-133 gas.  Perfusion images were obtained in multiple projections after intravenous injection of Tc- 70m MAA.  Radiopharmaceuticals:  10.6 mCi Xe-133 gas and 4.4 mCi Tc-71m MAA.  Comparison:  Portable chest radiograph 11/10/2011.  Findings: Comparison chest radiograph demonstrates cardiomegaly and increased interstitial opacities suggestive of vascular  congestion.  Ventilation imaging demonstrates no convincing ventilation defect. Persisting gas trapping.  Perfusion imaging demonstrates cardiovascular shadows.  No peripheral or suspicious perfusion defect.  IMPRESSION: Very low probability of acute pulmonary embolism.  Gas trapping.  Original Report Authenticated By: Harley Hallmark, M.D.    Assessment:  1. Principal Problem: 2.  *Shortness of breath 3. Active Problems: 4.  CHF,  acute diastolic, BNP 4k on admissio 5.  COPD (chronic obstructive pulmonary disease) 6.  Hypercapnemia 7.  Mild aortic stenosis 8.  CAD (coronary artery disease), MI R/O 9.  Hyperlipidemia 10.  Tobacco abuse 11.  Hyperkalemia, on ACE prior to admission 12.  Respiratory acidosis 13.  Pulmonary hypertension, PA 14.   Plan:  1. I's/O's have been evenly matched since admission. Wt. Remains the same. BNP remains elevated, some left heart failure, however, mostly right heart failure. I would switch her lasix to 40 IV BID for 2 days and then drop back to 40 po BID if she improves some with this.  She is not tolerating revatio, she had flushing and an anxiety attack after the last 2 doses.  Pulmonary recommends holding the ACE-I, which we can do. They also recommend pulmonary hypertension follow-up with Dr. Delton Coombes.   Time Spent Directly with Patient:  15 minutes  Length of Stay:  LOS: 3 days   Iantha Fallen  C. Rennis Golden, MD, [facility] Attending Cardiologist [facility]  Anabelle Bungert C 11/13/2011, 12:53 PM

## 2011-11-13 NOTE — Progress Notes (Signed)
Patient ID: [name], female   DOB: 1946/09/06, 66 y.o.   MRN: [id number]  Subjective: No events overnight. Patient denies chest pain, shortness of breath, abdominal pain. Had bowel movement and reports ambulating.  Objective:  Vital signs in last 24 hours:  Filed Vitals:   11/13/11 0916 11/13/11 1407 11/13/11 2040 11/13/11 2050  BP:  115/68  132/67  Pulse:  72  84  Temp:  98 F (36.7 C)  98.1 F (36.7 C)  TempSrc:  Oral  Oral  Resp:  18  18  Height:      Weight:      SpO2: 98% 96% 89% 92%    Intake/Output from previous day:   Intake/Output Summary (Last 24 hours) at 11/13/11 2229 Last data filed at 11/13/11 2051  Gross per 24 hour  Intake    590 ml  Output   1950 ml  Net  -1360 ml    Physical Exam: General: Alert, awake, oriented x3, in no acute distress. HEENT: No bruits, no goiter. Moist mucous membranes, no scleral icterus, no conjunctival pallor. Heart: Regular rate and rhythm, S1/S2 +, no murmurs, rubs, gallops. Lungs: Expiratory wheezing present and breath sounds decreased mostly at bases with minimal bibasilar crackles. No wheezing, no rhonchi, no rales.  Abdomen: Soft, nontender, nondistended, positive bowel sounds. Extremities: No clubbing or cyanosis, no pitting edema,  positive pedal pulses. Neuro: Grossly nonfocal.  Lab Results:  Basic Metabolic Panel:    Component Value Date/Time   NA 136 11/13/2011 0720   K 4.6 11/13/2011 0720   CL 95* 11/13/2011 0720   CO2 39* 11/13/2011 0720   BUN 18 11/13/2011 0720   CREATININE 0.69 11/13/2011 0720   GLUCOSE 116* 11/13/2011 0720   CALCIUM 9.1 11/13/2011 0720   CBC:    Component Value Date/Time   WBC 6.6 11/13/2011 0720   WBC 6.3 07/28/2011 1532   HGB 12.2 11/13/2011 0720   HGB 13.0 07/28/2011 1532   HCT 42.5 11/13/2011 0720   HCT 42.0 07/28/2011 1532   PLT 227 11/13/2011 0720   PLT 234 07/28/2011 1532   MCV 88.5 11/13/2011 0720   MCV 83.3 07/28/2011 1532   NEUTROABS 7.5 11/12/2011 0306   NEUTROABS 4.6  07/28/2011 1532   LYMPHSABS 0.6* 11/12/2011 0306   LYMPHSABS 1.2 07/28/2011 1532   MONOABS 0.4 11/12/2011 0306   MONOABS 0.4 07/28/2011 1532   EOSABS 0.0 11/12/2011 0306   EOSABS 0.0 07/28/2011 1532   BASOSABS 0.0 11/12/2011 0306   BASOSABS 0.0 07/28/2011 1532      Lab 11/13/11 0720 11/12/11 0306 11/11/11 2110 11/10/11 1400  WBC 6.6 8.4 8.5 8.0  HGB 12.2 11.3* 11.2* 12.6  HCT 42.5 39.2 37.0 41.9  PLT 227 244 214 312  MCV 88.5 87.1 87.1 86.2  MCH 25.4* 25.1* 24.5* 25.9*  MCHC 28.7* 28.8* 28.1* 30.1  RDW 18.2* 18.7* 18.6* 18.7*  LYMPHSABS -- 0.6* 0.8 0.5*  MONOABS -- 0.4 0.8 0.6  EOSABS -- 0.0 0.0 0.0  BASOSABS -- 0.0 0.0 0.1  BANDABS -- -- -- --    Lab 11/13/11 0720 11/12/11 0306 11/11/11 2110 11/11/11 1120 11/10/11 1730 11/10/11 1400  NA 136 132* -- 134* -- 136  K 4.6 5.5* -- 5.5* 6.2* 6.3*  CL 95* 99 -- 98 -- 95*  CO2 39* 30 -- 28 -- 31  GLUCOSE 116* 119* -- 106* -- 96  BUN 18 28* -- 29* -- 35*  CREATININE 0.69 0.96 -- 1.06 -- 1.84*  CALCIUM 9.1 9.1 --  9.1 -- 9.5  MG -- -- 1.9 -- -- --   No results found for this basename: INR:5,PROTIME:5 in the last 168 hours Cardiac markers:  Lab 11/12/11 0306 11/11/11 1921 11/11/11 1040  CKMB 5.5* 5.5* 5.5*  TROPONINI <0.30 0.35* 0.32*  MYOGLOBIN -- -- --   Studies/Results: No results found.  Medications: Scheduled Meds:   . albuterol  2.5 mg Nebulization Q4H  . anastrozole  1 mg Oral Daily  . aspirin  325 mg Oral Daily  . calcium carbonate  1 tablet Oral Daily  . enoxaparin (LOVENOX) injection  40 mg Subcutaneous Q24H  . fluticasone  2 puff Inhalation BID  . furosemide  40 mg Intravenous BID  . methylPREDNISolone (SOLU-MEDROL) injection  40 mg Intravenous Q8H  . montelukast  10 mg Oral QHS  . mulitivitamin with minerals  1 tablet Oral Daily  . nortriptyline  25 mg Oral QHS  . omega-3 acid ethyl esters  1 g Oral Daily  . pantoprazole  40 mg Oral Q1200  . simvastatin  20 mg Oral Daily  . tiotropium  18 mcg Inhalation  Daily  . vancomycin  1,250 mg Intravenous Q24H  . DISCONTD: Fluticasone-Salmeterol  1 puff Inhalation BID  . DISCONTD: furosemide  40 mg Oral Daily  . DISCONTD: sildenafil  20 mg Oral TID   Continuous Infusions:  PRN Meds:.albuterol, ALPRAZolam, butalbital-acetaminophen-caffeine, diclofenac sodium  Assessment/Plan:  Principal Problem:  *Shortness of breath : Multifactorial likely secondary to severe COPD with chronic hypercarbic respiratory failure/respiratory acidosis, severe pulmonary hypertension and ongoing nicotine abuse.  - pt clinically improving but still has expiratory wheezing noted - will continue solumedrol IV and plan tapering to PO once clinically more improved - Continue scheduled nebulizers, Flovent, - continue Lasix dosed as recommended by cardiology, 40 mg BID IV  Active Problems:  COPD (chronic obstructive pulmonary disease)  - treat with nebulizer PRN and scheduled, antibiotics, steroids  - Home O2 evaluation at the time of disposition   ACUTE RENAL FAILURE: - resolved  Hyperkalemia: - resolved  Swelling of extremity, right: Negative for DVT or superficial thrombosis  - On Vancomycin, keep extremity elevated  - will d/c today and taper to PO since pt tolerating PO well and cellulitis significantly improved  Acute diastolic CHF exacerbation :  - Continue Lasix, I's and O's, appreciate cardiology recommendations   DVT Prophylaxis: Change Lovenox to DVT prophylaxis, patient does not have any ACS, DVT or PE on the imagings   Code Status: Full code   Disposition: not medically ready   EDUCATION - test results and diagnostic studies were discussed with patient - patient verbalized the understanding - questions were answered at the bedside and contact information was provided for additional questions or concerns   LOS: 3 days    MAGICK-Yun Gutierrez 11/13/2011, 10:29 PM  [facility] Pager: [phone]

## 2011-11-14 DIAGNOSIS — I279 Pulmonary heart disease, unspecified: Secondary | ICD-10-CM

## 2011-11-14 DIAGNOSIS — J449 Chronic obstructive pulmonary disease, unspecified: Secondary | ICD-10-CM

## 2011-11-14 DIAGNOSIS — J9610 Chronic respiratory failure, unspecified whether with hypoxia or hypercapnia: Secondary | ICD-10-CM

## 2011-11-14 LAB — CBC
HCT: 41.6 % (ref 36.0–46.0)
Hemoglobin: 12.3 g/dL (ref 12.0–15.0)
MCH: 25.7 pg — ABNORMAL LOW (ref 26.0–34.0)
MCHC: 29.6 g/dL — ABNORMAL LOW (ref 30.0–36.0)
MCV: 87 fL (ref 78.0–100.0)
Platelets: 223 10*3/uL (ref 150–400)
RBC: 4.78 MIL/uL (ref 3.87–5.11)
RDW: 18.1 % — ABNORMAL HIGH (ref 11.5–15.5)
WBC: 7.8 10*3/uL (ref 4.0–10.5)

## 2011-11-14 LAB — BASIC METABOLIC PANEL
BUN: 20 mg/dL (ref 6–23)
CO2: 44 mEq/L (ref 19–32)
Calcium: 9.5 mg/dL (ref 8.4–10.5)
Chloride: 90 mEq/L — ABNORMAL LOW (ref 96–112)
Creatinine, Ser: 0.71 mg/dL (ref 0.50–1.10)
GFR calc Af Amer: >90 mL/min (ref >90)
GFR calc non Af Amer: 89 mL/min — ABNORMAL LOW (ref >90)
Glucose, Bld: 103 mg/dL — ABNORMAL HIGH (ref 70–99)
Potassium: 4 mEq/L (ref 3.5–5.1)
Sodium: 139 mEq/L (ref 135–145)

## 2011-11-14 MED ORDER — PREDNISONE (PAK) 10 MG PO TABS: 10.0000 mg | ORAL_TABLET | Freq: Every day | ORAL | Status: AC

## 2011-11-14 MED ORDER — FUROSEMIDE 40 MG PO TABS: 40.0000 mg | ORAL_TABLET | Freq: Two times a day (BID) | ORAL | Status: DC

## 2011-11-14 MED ORDER — FUROSEMIDE 10 MG/ML IJ SOLN
40.0000 mg | Freq: Two times a day (BID) | INTRAMUSCULAR | Status: DC
  Administered 2011-11-14 – 2011-11-15 (×2): 40 mg via INTRAVENOUS
  Filled 2011-11-14 (×4): qty 4

## 2011-11-14 MED ORDER — FLUTICASONE PROPIONATE HFA 220 MCG/ACT IN AERO: 2.0000 | INHALATION_SPRAY | Freq: Two times a day (BID) | RESPIRATORY_TRACT | Status: DC

## 2011-11-14 MED ORDER — TIOTROPIUM BROMIDE MONOHYDRATE 18 MCG IN CAPS: 18.0000 ug | ORAL_CAPSULE | Freq: Every day | RESPIRATORY_TRACT | Status: DC

## 2011-11-14 MED ORDER — DOXYCYCLINE HYCLATE 100 MG PO TABS: 100.0000 mg | ORAL_TABLET | Freq: Two times a day (BID) | ORAL | Status: AC

## 2011-11-14 MED ORDER — PREDNISONE 50 MG PO TABS
60.0000 mg | ORAL_TABLET | Freq: Once | ORAL | Status: AC
  Administered 2011-11-14: 60 mg via ORAL
  Filled 2011-11-14: qty 1

## 2011-11-14 NOTE — Progress Notes (Signed)
Name: [name] MRN: [id number] DOB: 1946-02-24    LOS: 4  PCCM ADMISSION NOTE  Brief patient profile: : 65 yowf smoker on chronic 02 under pulmonologist's care until moved to [facility] May 2012 and no 02 since then admit 2/13 with aecopd with severe PAH by echo so pulmonary asked to see by Dr Rennis Golden re ? revatio rx   HPI Sob progressively worse 48 hours prior to presenting to the emergency room. Shortness of breath is said to be associated with cough that is productive of yellowish sputum. Denies any associated chest pain. No palpitations. No nausea or vomiting. Patient denies any PND or orthopnea. She denies any fever, chills or Rigors. Symptoms are said to be getting progressively worse hence patient presented to the emergency room to be evaluated and admit by [facility].  Baseline = [employer] slow s 02, congested cough worse in am's with slt yellow sputum       Vital Signs: BP 139/70  Pulse 79  Temp(Src) 97.9 F (36.6 C) (Oral)  Resp 18  Ht 5\' 2"  (1.575 m)  Wt 134 lb 9.6 oz (61.054 kg)  BMI 24.62 kg/m2  SpO2 96%    Intake/Output Summary (Last 24 hours) at 11/14/11 1345 Last data filed at 11/14/11 0438  Gross per 24 hour  Intake      0 ml  Output   2100 ml  Net  -2100 ml     Physical Examination: Elderly hoarse wf nad on 02 3lpm at 96%/ congested cough HEENT mild turbinate edema.  Oropharynx no thrush or excess pnd or cobblestoning.  No JVD or cervical adenopathy. Mild accessory muscle hypertrophy. Trachea midline, nl thryroid. Chest was hyperinflated by percussion with diminished breath sounds and moderate increased exp time without wheeze. Hoover sign positive at mid inspiration. Regular rate and rhythm without murmur gallop or rub or increase P2 or edema.  Abd: no hsm, nl excursion. Ext warm without cyanosis or clubbing.        Labs   Lab 11/14/11 0504 11/13/11 0720 11/12/11 0306  NA 139 136 132*  K 4.0 4.6 5.5*  CL 90* 95* 99  CO2 44* 39* 30  BUN 20 18 28*  CREATININE  0.71 0.69 0.96  GLUCOSE 103* 116* 119*    Lab 11/14/11 0504 11/13/11 0720 11/12/11 0306  HGB 12.3 12.2 11.3*  HCT 41.6 42.5 39.2  WBC 7.8 6.6 8.4  PLT 223 227 244      ASSESSMENT AND PLAN     PULMONARY  Lab 11/11/11 1145 11/10/11 1522  PHART 7.261* 7.216*  PCO2ART 65.1* 79.3*  PO2ART 80.7 307.0*  HCO3 28.3* 31.0*  O2SAT 94.7 99.6   A:  Acute on chronic hypoxemic and hypercarbic resp failure with secondary PAH (cor pulmonale).   Rec No smoking 24 hour 02  Rx copd as you are F/u with Dr Delton Coombes as outpt  CARDIOVASCULAR ACE inhibitors are problematic in  pts with airway complaints because  even experienced pulmonologists can't always distinguish ace effects from copd/asthma.  By themselves they don't actually cause a problem, much like oxygen can't by itself start a fire, but they certainly serve as a powerful catalyst or enhancer for any "fire"  or inflammatory process in the upper airway, be it caused by an ET  tube or more commonly reflux (especially in the obese or pts with known GERD or who are on biphoshonates).    In the era of ARB near equivalency until we have a better handle on the reversibility  of the airway problem, it just makes sense to avoid ACEI  entirely in the short run and then decide later, having established a level of airway control using a reasonable limited regimen, whether to add back ace but even then being very careful to observe the pt for worsening airway control and number of meds used/ needed to control symptoms.   I wouldn't use acei in Rt heart failure due to copd because it muddies the water in terms of symptom response   RENAL  Lab 11/14/11 0504 11/13/11 0720 11/12/11 0306 11/11/11 2110 11/11/11 1120 11/10/11 1400  NA 139 136 132* -- 134* 136  K 4.0 4.6 -- -- -- --  CL 90* 95* 99 -- 98 95*  CO2 44* 39* 30 -- 28 31  BUN 20 18 28* -- 29* 35*  CREATININE 0.71 0.69 0.96 -- 1.06 1.84*  CALCIUM 9.5 9.1 9.1 -- 9.1 9.5  MG -- -- -- 1.9 -- --    PHOS -- -- -- -- -- --   A:  Hyperkalemia on ACEI,resolved off ACEI   Pulmonary f/u as inpt prn  Call [phone] on discharge for appt with Dr Alto Denver, MD Pulmonary and Critical Care Medicine [facility] [phone]

## 2011-11-14 NOTE — Progress Notes (Signed)
[facility]  DAILY PROGRESS NOTE   Subjective:  Diuresed nicely yesterday ~2L. Total weight change is down 9 lb. Since admission.   Objective:  Temp:  [97.9 F (36.6 C)-98.1 F (36.7 C)] 97.9 F (36.6 C) (02/17 0444) Pulse Rate:  [72-84] 79  (02/17 0444) Resp:  [18] 18  (02/17 0444) BP: (115-139)/(67-70) 139/70 mmHg (02/17 0444) SpO2:  [89 %-96 %] 96 % (02/17 1114) Weight:  [61.054 kg (134 lb 9.6 oz)] 61.054 kg (134 lb 9.6 oz) (02/17 0444) Weight change: -4.082 kg (-9 lb)  Intake/Output from previous day: 02/16 0701 - 02/17 0700 In: 340 [P.O.:340] Out: 2350 [Urine:2350]  Intake/Output from this shift:    Medications: Current Facility-Administered Medications  Medication Dose Route Frequency Provider Last Rate Last Dose  . albuterol (PROVENTIL) (5 MG/ML) 0.5% nebulizer solution 2.5 mg  2.5 mg Nebulization Q4H Iskra Magick-Myers, MD   2.5 mg at 11/14/11 1110  . albuterol (PROVENTIL) (5 MG/ML) 0.5% nebulizer solution 2.5 mg  2.5 mg Nebulization Q2H PRN Iskra Magick-Myers, MD      . ALPRAZolam Prudy Feeler) tablet 1 mg  1 mg Oral TID PRN Talmage Nap, MD   1 mg at 11/14/11 0435  . anastrozole (ARIMIDEX) tablet 1 mg  1 mg Oral Daily Talmage Nap, MD   1 mg at 11/14/11 1031  . aspirin tablet 325 mg  325 mg Oral Daily Eda Paschal [place], [place]   325 mg at 11/14/11 1028  . butalbital-acetaminophen-caffeine (FIORICET, ESGIC) 50-325-40 MG per tablet 1 tablet  1 tablet Oral Q4H PRN Talmage Nap, MD   1 tablet at 11/14/11 0435  . calcium carbonate (TUMS - dosed in mg elemental calcium) chewable tablet 200 mg of elemental calcium  1 tablet Oral Daily Talmage Nap, MD   200 mg of elemental calcium at 11/14/11 1028  . diclofenac sodium (VOLTAREN) 1 % transdermal gel 1 application  1 application Topical Daily PRN Talmage Nap, MD      . enoxaparin (LOVENOX) injection 40 mg  40 mg Subcutaneous Q24H Ripudeep Jenna Luo, MD   40 mg at 11/13/11 2014  .  fluticasone (FLOVENT HFA) 110 MCG/ACT inhaler 2 puff  2 puff Inhalation BID Ripudeep K Rai, MD   2 puff at 11/14/11 1112  . furosemide (LASIX) injection 40 mg  40 mg Intravenous BID Chrystie Nose, MD   40 mg at 11/14/11 0800  . montelukast (SINGULAIR) tablet 10 mg  10 mg Oral QHS Talmage Nap, MD   10 mg at 11/13/11 2014  . mulitivitamin with minerals tablet 1 tablet  1 tablet Oral Daily Talmage Nap, MD   1 tablet at 11/14/11 1029  . nortriptyline (PAMELOR) capsule 25 mg  25 mg Oral QHS Talmage Nap, MD   25 mg at 11/13/11 2014  . omega-3 acid ethyl esters (LOVAZA) capsule 1 g  1 g Oral Daily Talmage Nap, MD   1 g at 11/14/11 1029  . pantoprazole (PROTONIX) EC tablet 40 mg  40 mg Oral Q1200 Talmage Nap, MD   40 mg at 11/13/11 1200  . predniSONE (DELTASONE) tablet 60 mg  60 mg Oral Once Ripudeep Jenna Luo, MD   60 mg at 11/14/11 0900  . simvastatin (ZOCOR) tablet 20 mg  20 mg Oral Daily Talmage Nap, MD   20 mg at 11/14/11 1029  . sulfamethoxazole-trimethoprim (BACTRIM DS) 800-160 MG per tablet 1 tablet  1 tablet Oral Q12H Iskra Magick-Myers, MD   1 tablet at 11/14/11 1029  .  tiotropium (SPIRIVA) inhalation capsule 18 mcg  18 mcg Inhalation Daily Sandrea Hughs, MD      . DISCONTD: Fluticasone-Salmeterol (ADVAIR) 250-50 MCG/DOSE inhaler 1 puff  1 puff Inhalation BID Sandrea Hughs, MD   1 puff at 11/12/11 2108  . DISCONTD: furosemide (LASIX) tablet 40 mg  40 mg Oral Daily Lennette Bihari, MD   40 mg at 11/13/11 1000  . DISCONTD: methylPREDNISolone sodium succinate (SOLU-MEDROL) 40 MG injection 40 mg  40 mg Intravenous Q8H Ripudeep K Rai, MD   40 mg at 11/14/11 0017  . DISCONTD: sildenafil (REVATIO) tablet 20 mg  20 mg Oral TID Chrystie Nose, MD   20 mg at 11/13/11 1000  . DISCONTD: vancomycin (VANCOCIN) 1,250 mg in sodium chloride 0.9 % 250 mL IVPB  1,250 mg Intravenous Q24H Ripudeep Jenna Luo, MD   1,250 mg at 11/13/11 0347    Physical Exam: General appearance: alert and  no distress Neck: JVD - 2 cm above sternal notch, no adenopathy, no carotid bruit, supple, symmetrical, trachea midline and thyroid not enlarged, symmetric, no tenderness/mass/nodules Lungs: rales bilaterally and wheezes bibasilar Heart: regular rate and rhythm, S1, S2 normal, no murmur, click, rub or gallop Abdomen: soft, non-tender; bowel sounds normal; no masses,  no organomegaly Extremities: trace edema, RUE>LUE edema 2nd to breast CA  Lab Results: Results for orders placed during the hospital encounter of 11/10/11 (from the past 48 hour(s))  CBC     Status: Abnormal   Collection Time   11/13/11  7:20 AM      Component Value Range Comment   WBC 6.6  4.0 - 10.5 (K/uL)    RBC 4.80  3.87 - 5.11 (MIL/uL)    Hemoglobin 12.2  12.0 - 15.0 (g/dL)    HCT 16.1  09.6 - 04.5 (%)    MCV 88.5  78.0 - 100.0 (fL)    MCH 25.4 (*) 26.0 - 34.0 (pg)    MCHC 28.7 (*) 30.0 - 36.0 (g/dL)    RDW 40.9 (*) 81.1 - 15.5 (%)    Platelets 227  150 - 400 (K/uL)   BASIC METABOLIC PANEL     Status: Abnormal   Collection Time   11/13/11  7:20 AM      Component Value Range Comment   Sodium 136  135 - 145 (mEq/L)    Potassium 4.6  3.5 - 5.1 (mEq/L)    Chloride 95 (*) 96 - 112 (mEq/L)    CO2 39 (*) 19 - 32 (mEq/L)    Glucose, Bld 116 (*) 70 - 99 (mg/dL)    BUN 18  6 - 23 (mg/dL)    Creatinine, Ser 9.14  0.50 - 1.10 (mg/dL)    Calcium 9.1  8.4 - 10.5 (mg/dL)    GFR calc non Af Amer 89 (*) >90 (mL/min)    GFR calc Af Amer >90  >90 (mL/min)   PRO B NATRIURETIC PEPTIDE     Status: Abnormal   Collection Time   11/13/11  7:20 AM      Component Value Range Comment   Pro B Natriuretic peptide (BNP) 4521.0 (*) 0 - 125 (pg/mL)   CBC     Status: Abnormal   Collection Time   11/14/11  5:04 AM      Component Value Range Comment   WBC 7.8  4.0 - 10.5 (K/uL)    RBC 4.78  3.87 - 5.11 (MIL/uL)    Hemoglobin 12.3  12.0 - 15.0 (g/dL)    HCT 41.6  36.0 - 46.0 (%)    MCV 87.0  78.0 - 100.0 (fL)    MCH 25.7 (*) 26.0 - 34.0  (pg)    MCHC 29.6 (*) 30.0 - 36.0 (g/dL)    RDW 04.5 (*) 40.9 - 15.5 (%)    Platelets 223  150 - 400 (K/uL)   BASIC METABOLIC PANEL     Status: Abnormal   Collection Time   11/14/11  5:04 AM      Component Value Range Comment   Sodium 139  135 - 145 (mEq/L)    Potassium 4.0  3.5 - 5.1 (mEq/L)    Chloride 90 (*) 96 - 112 (mEq/L)    CO2 44 (*) 19 - 32 (mEq/L)    Glucose, Bld 103 (*) 70 - 99 (mg/dL)    BUN 20  6 - 23 (mg/dL)    Creatinine, Ser 8.11  0.50 - 1.10 (mg/dL)    Calcium 9.5  8.4 - 10.5 (mg/dL)    GFR calc non Af Amer 89 (*) >90 (mL/min)    GFR calc Af Amer >90  >90 (mL/min)     Imaging: No results found.  Assessment:  1. Principal Problem: 2.  *Shortness of breath 3. Active Problems: 4.  CHF,  acute diastolic, BNP 4k on admissio 5.  COPD (chronic obstructive pulmonary disease) 6.  Hypercapnemia 7.  Mild aortic stenosis 8.  CAD (coronary artery disease), MI R/O 9.  Hyperlipidemia 10.  Tobacco abuse 11.  Hyperkalemia, on ACE prior to admission 12.  Respiratory acidosis 13.  Pulmonary hypertension, PA 14.   Plan:  1. Continue diuresis as outlined yesterday . Breathing improved today. Re-check BNP in the am.   Time Spent Directly with Patient:  15 minutes  Length of Stay:  LOS: 4 days   Chrystie Nose, MD, [facility] Attending Cardiologist [facility]  Rhylee Pucillo C 11/14/2011, 11:24 AM

## 2011-11-14 NOTE — Progress Notes (Signed)
[name]  RUE:[id number]  DOB: Sep 06, 1946  DOA: 11/10/2011  PCP: Elby Showers, MD, MD  Subjective: Shortness of breath improving, denies any chest pain or abdominal pain  Objective: Weight change: -4.082 kg (-9 lb)  Intake/Output Summary (Last 24 hours) at 11/14/11 1522 Last data filed at 11/14/11 1300  Gross per 24 hour  Intake    540 ml  Output   3500 ml  Net  -2960 ml   Blood pressure 134/70, pulse 89, temperature 98.2 F (36.8 C), temperature source Oral, resp. rate 18, height 5\' 2"  (1.575 m), weight 61.054 kg (134 lb 9.6 oz), SpO2 91.00%.  Physical Exam: General: Alert and awake, oriented x3, not in any acute distress. HEENT: anicteric sclera, pupils reactive to light and accommodation, EOMI CVS: S1-S2 clear, no murmur rubs or gallops Chest: Decreased breath sounds throughout lung fields Abdomen: soft nontender, nondistended, normal bowel sounds, no organomegaly Extremities: no cyanosis, clubbing. Trace edema noted bilaterally Neuro: Cranial nerves II-XII intact, no focal neurological deficits  Lab Results: Basic Metabolic Panel:  Lab 11/14/11 1478 11/13/11 0720 11/11/11 2110  NA 139 136 --  K 4.0 4.6 --  CL 90* 95* --  CO2 44* 39* --  GLUCOSE 103* 116* --  BUN 20 18 --  CREATININE 0.71 0.69 --  CALCIUM 9.5 9.1 --  MG -- -- 1.9  PHOS -- -- --   Liver Function Tests:  Lab 11/12/11 0306  AST 30  ALT 45*  ALKPHOS 148*  BILITOT 0.1*  PROT 6.6  ALBUMIN 2.6*   CBC:  Lab 11/14/11 0504 11/13/11 0720 11/12/11 0306  WBC 7.8 6.6 --  NEUTROABS -- -- 7.5  HGB 12.3 12.2 --  HCT 41.6 42.5 --  MCV 87.0 88.5 --  PLT 223 227 --   Cardiac Enzymes:  Lab 11/12/11 0306 11/11/11 1921 11/11/11 1040  CKTOTAL 155 194* 202*  CKMB 5.5* 5.5* 5.5*  CKMBINDEX -- -- --  TROPONINI <0.30 0.35* 0.32*    Studies/Results: Nm Pulmonary Per & Vent  11/11/2011  *RADIOLOGY REPORT*  Clinical Data:  66 year old female with shortness of breath.  NUCLEAR MEDICINE VENTILATION  - PERFUSION LUNG SCAN    IMPRESSION: Very low probability of acute pulmonary embolism.  Gas trapping.  Original Report Authenticated By: Harley Hallmark, M.D.   Dg Chest Port 1 View  11/10/2011  *RADIOLOGY REPORT*  Clinical Data: Shortness of breath, asthma, hypertension.  PORTABLE CHEST - 1 VIEW  Comparison:   IMPRESSION: Cardiomegaly with stable interstitial prominence, recurrent interstitial edema versus chronic lung disease.  Original Report Authenticated By: Cyndie Chime, M.D.   2-D echocardiogram 11/12/2011 Study Conclusions  - Left ventricle: The cavity size was normal. Wall thickness was normal. Systolic function was normal. The estimated ejection fraction was in the range of 55% to 60%. Wall motion was normal; there were no regional wall motion abnormalities. Doppler parameters are consistent with abnormal left ventricular relaxation (grade 1 diastolic dysfunction). - Ventricular septum: There is septal flattening (D-sign) and paradoxical septal motion noted, suggesting both pressure and volume overload of the right ventricle. - Aortic valve: Calcified aortic valve. There is mild aortic stenosis with an AVA of 1.5-1.6 cm2. Peak and mean gradients are 27 and 13 mmHg, respectively, based on an LVOT diameter of 1.9 cm. Valve area: 1.69cm^2(VTI). Valve area: 1.49cm^2 (Vmax). - Mitral valve: There is flat coaptation of the mitral leaflets.MAC. Trace to mild MR. - Left atrium: The atrium was mildly dilated. - Right ventricle: The cavity size was moderately dilated.  The moderator band was prominent. Systolic function was normal. - Right atrium: Moderately dilated. - Pulmonary arteries: PA peak pressure: 76mm Hg (S). This suggests severe pulmonary hypertension. - Inferior vena cava: The vessel was dilated; the respirophasic diameter changes were blunted (< 50%); findings are consistent with elevated central venous pressure. - Pericardium, extracardiac: A trivial pericardial  effusion was identified posterior to the heart.    Medications: Scheduled Meds:    . albuterol  2.5 mg Nebulization Q4H  . anastrozole  1 mg Oral Daily  . aspirin  325 mg Oral Daily  . calcium carbonate  1 tablet Oral Daily  . enoxaparin (LOVENOX) injection  40 mg Subcutaneous Q24H  . fluticasone  2 puff Inhalation BID  . furosemide  40 mg Intravenous BID  . montelukast  10 mg Oral QHS  . mulitivitamin with minerals  1 tablet Oral Daily  . nortriptyline  25 mg Oral QHS  . omega-3 acid ethyl esters  1 g Oral Daily  . pantoprazole  40 mg Oral Q1200  . predniSONE  60 mg Oral Once  . simvastatin  20 mg Oral Daily  . sulfamethoxazole-trimethoprim  1 tablet Oral Q12H  . tiotropium  18 mcg Inhalation Daily  . DISCONTD: Fluticasone-Salmeterol  1 puff Inhalation BID  . DISCONTD: furosemide  40 mg Intravenous BID  . DISCONTD: methylPREDNISolone (SOLU-MEDROL) injection  40 mg Intravenous Q8H  . DISCONTD: vancomycin  1,250 mg Intravenous Q24H   Continuous Infusions:    Assessment/Plan:  Principal Problem:  *Shortness of breath : Multifactorial likely secondary to severe COPD with chronic hypercarbic respiratory failure/respiratory acidosis, severe pulmonary hypertension and ongoing nicotine abuse.  - VQ scan low probability for PE. 2-D echocardiogram done which showed preserved EF with no regional wall motion abnormalities.  - Continue scheduled nebulizer treatments, Flovent, IV Lasix  - Will need home O2 evaluation upon discharge  Active Problems:  COPD (chronic obstructive pulmonary disease)  - treat with nebulizer PRN and scheduled, antibiotics, steroids  - Home O2 evaluation at the time of disposition  ACUTE RENAL FAILURE: Resolved   Hyperkalemia: Resolved    Swelling of extremity, right: Negative for DVT or superficial thrombosis  - On Bactrim,  keep extremity elevated   Acute diastolic CHF exacerbation :  - Continue IV Lasix for next 24 hours, I's and O's, recheck  BNP  in a.m. appreciate cardiology recommendations   DVT Prophylaxis: Lovenox  Code Status: Full code   Disposition: Hopefully in next 24-48 hours   LOS: 4 days   Luevenia Mcavoy M.D. [facility] 11/14/2011, 3:22 PM

## 2011-11-14 NOTE — Plan of Care (Signed)
1030A Pt ambulated (without oxygen) to hallway w/ CNA-SP02 72%-however, pt asymptomatic.  Pt escorted back to room resting in chair  w/ oxygen-3L infusing via nasal cannula-SP02 93%-3L. Pt made comfortable w/o apparent distress-will continue to monitor. AW

## 2011-11-14 NOTE — Evaluation (Signed)
Physical Therapy One-Time Evaluation Patient Details Name: [name] MRN: [id number] DOB: [birth year] Today's Date: 11/14/2011  Problem List:  Patient Active Problem List  Diagnoses  . Altered mental status  . CHF,  acute diastolic, BNP 4k on admissio  . COPD (chronic obstructive pulmonary disease)  . Hypercapnemia  . Mild aortic stenosis  . CAD (coronary artery disease), MI R/O  . Hyperlipidemia  . Tobacco abuse  . S/P cholecystectomy  . NSVT (nonsustained ventricular tachycardia)  . Ascites  . Abnormal LFTs  . Shortness of breath  . Hyperkalemia, on ACE prior to admission  . Swelling of extremity, right  . Respiratory acidosis  . ARF (acute renal failure)  . Pulmonary hypertension, PA    Past Medical History:  Past Medical History  Diagnosis Date  . Angina   . Heart murmur   . Asthma   . Shortness of breath   . Cancer of breast unknown    RT Breast  . Depressed   . Coronary artery disease 2006    2 stents w/previous MI  . Hypertension   . CHF (congestive heart failure)   . Migraine   . Anxiety   . Myocardial infarction 2006  . COPD (chronic obstructive pulmonary disease)    Past Surgical History:  Past Surgical History  Procedure Date  . Breast lumpectomy     Right  . Cardiac catheterization 2006    PT Assessment/Plan/Recommendation PT Assessment Clinical Impression Statement: Patient from home with COPD and pulmonary hypertension presents at functional baseline.  Does have significant oxygen need without any symptoms of dyspnea with O2 sats in low 80's.  Will need full time oxygen at home (though patient continues to deny need due to fear the children will get tripped up in tubing and doesn't like the looks of it for outings.)  No further skilled PT needs at this time. PT Recommendation/Assessment: Patent does not need any further PT services No Skilled PT: Patient is independent with all acitivity/mobility PT Recommendation Equipment  Recommended: None recommended by PT PT Goals     PT Evaluation Precautions/Restrictions    Prior Functioning  Home Living Lives With: Daughter Type of Home: House Home Layout: One level Home Access: Stairs to enter Entrance Stairs-Rails: Left;Right;Can reach both Entrance Stairs-Number of Steps: 5 Home Adaptive Equipment: Other (comment) (oxygen) Prior Function Level of Independence: Independent with basic ADLs;Independent with transfers;Independent with homemaking with ambulation;Independent with gait Comments: assists daughter in care of 56 and 68 year old children Cognition Cognition Arousal/Alertness: Awake/alert Overall Cognitive Status: Appears within functional limits for tasks assessed Sensation/Coordination   Extremity Assessment RLE Assessment RLE Assessment: Within Functional Limits LLE Assessment LLE Assessment: Within Functional Limits Mobility (including Balance) Bed Mobility Bed Mobility: Yes Supine to Sit: 7: Independent Sit to Supine: 7: Independent Transfers Transfers: Yes Sit to Stand: 7: Independent Stand to Sit: 7: Independent Ambulation/Gait Ambulation/Gait: Yes Ambulation/Gait Assistance: 7: Independent Ambulation Distance (Feet): 200 Feet Assistive device: None Gait Pattern: Step-through pattern Gait velocity: fast  Posture/Postural Control Posture/Postural Control: No significant limitations Exercise    End of Session PT - End of Session Activity Tolerance: Patient tolerated treatment well Patient left: in bed;with call bell in reach General Behavior During Session: [facility] for tasks performed Cognition: [facility] for tasks performed  [facility] 11/14/2011, 1:34 PM

## 2011-11-14 NOTE — Plan of Care (Signed)
1610R Alert & responsive upon initial rounds. Dr. Isidoro Donning in to see patient; new orders acknowledged for possible discharge today.  Pt consumed po liquids for breakfast w/ medications-tolerated well. Pt assisted to bathroom w/02 in place-gait steady-no acute distress observed.Monitoring accordingly.AW

## 2011-11-15 LAB — BASIC METABOLIC PANEL
BUN: 25 mg/dL — ABNORMAL HIGH (ref 6–23)
CO2: >45 mEq/L (ref 19–32)
Calcium: 9.7 mg/dL (ref 8.4–10.5)
Chloride: 84 mEq/L — ABNORMAL LOW (ref 96–112)
Creatinine, Ser: 0.87 mg/dL (ref 0.50–1.10)
GFR calc Af Amer: 79 mL/min — ABNORMAL LOW (ref >90)
GFR calc non Af Amer: 68 mL/min — ABNORMAL LOW (ref >90)
Glucose, Bld: 85 mg/dL (ref 70–99)
Potassium: 3.4 mEq/L — ABNORMAL LOW (ref 3.5–5.1)
Sodium: 136 mEq/L (ref 135–145)

## 2011-11-15 LAB — PRO B NATRIURETIC PEPTIDE: Pro B Natriuretic peptide (BNP): 3023 pg/mL — ABNORMAL HIGH (ref 0–125)

## 2011-11-15 LAB — CBC
HCT: 43.5 % (ref 36.0–46.0)
Hemoglobin: 13 g/dL (ref 12.0–15.0)
MCH: 25.8 pg — ABNORMAL LOW (ref 26.0–34.0)
MCHC: 29.9 g/dL — ABNORMAL LOW (ref 30.0–36.0)
MCV: 86.5 fL (ref 78.0–100.0)
Platelets: 234 10*3/uL (ref 150–400)
RBC: 5.03 MIL/uL (ref 3.87–5.11)
RDW: 18.2 % — ABNORMAL HIGH (ref 11.5–15.5)
WBC: 7.6 10*3/uL (ref 4.0–10.5)

## 2011-11-15 MED ORDER — POTASSIUM CHLORIDE CRYS ER 20 MEQ PO TBCR
40.0000 meq | EXTENDED_RELEASE_TABLET | Freq: Once | ORAL | Status: AC
  Administered 2011-11-15: 40 meq via ORAL
  Filled 2011-11-15: qty 2

## 2011-11-15 NOTE — Discharge Summary (Signed)
Physician Discharge Summary  Patient ID: [name] MRN: [id number] DOB/AGE: [birth year] 66 y.o.  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 Cardiology, Dr. Rennis Golden                     2. pulmonology, Dr. Sherene Sires  Discharge Medications: Medication List  As of 11/15/2011 10:38 AM   STOP taking these medications         enalapril 5 MG tablet      ipratropium-albuterol 0.5-2.5 (3) MG/3ML Soln         TAKE these medications         albuterol 108 (90 BASE) MCG/ACT inhaler   Commonly known as: PROVENTIL HFA;VENTOLIN HFA   Inhale 2 puffs into the lungs every 4 (four) hours as needed. Shortness of breath and wheezing      ALPRAZolam 1 MG tablet   Commonly known as: XANAX   Take 1 mg by mouth 3 (three) times daily as needed. For anxitey      anastrozole 1 MG tablet   Commonly known as: ARIMIDEX   Take 1 mg by mouth daily.      aspirin 325 MG tablet   Take 325 mg by mouth daily.      atorvastatin 80 MG tablet   Commonly known as: LIPITOR   Take 80 mg by mouth at bedtime.      butalbital-acetaminophen-caffeine 50-325-40 MG per tablet   Commonly known as: FIORICET, ESGIC   Take 1 tablet by mouth every 4 (four) hours as needed. For headaches      calcium carbonate 500 MG chewable tablet   Commonly known as: TUMS - dosed in mg elemental calcium   Chew 1 tablet by mouth daily.      diclofenac sodium 1 % Gel   Commonly known as: VOLTAREN   Apply 1 application topically daily as needed. Hand pain      doxycycline 100 MG  tablet   Commonly known as: VIBRA-TABS   Take 1 tablet (100 mg total) by mouth 2 (two) times daily.      fish oil-omega-3 fatty acids 1000 MG capsule   Take 1 g by mouth daily.      fluticasone 220 MCG/ACT inhaler   Commonly known as: FLOVENT HFA   Inhale 2 puffs into the lungs 2 (two) times daily. For shortness of breath      furosemide 40 MG tablet   Commonly known as: LASIX   Take 1 tablet (40 mg total) by mouth 2 (two) times daily.      ipratropium 17 MCG/ACT inhaler   Commonly known as: ATROVENT HFA   Inhale 2 puffs into the lungs every 4 (four) hours as needed. For shortness of breath      lactulose 10 GM/15ML solution   Commonly known as: CHRONULAC   Take 30 g by mouth as needed.      montelukast 10 MG tablet   Commonly known as: SINGULAIR   Take 10 mg by mouth at bedtime.      multivitamin with minerals tablet   Take 1 tablet by mouth daily.  nortriptyline 25 MG capsule   Commonly known as: PAMELOR   Take 25 mg by mouth at bedtime.      omega-3 acid ethyl esters 1 G capsule   Commonly known as: LOVAZA   Take 1 capsule (1 g total) by mouth daily.      pantoprazole 40 MG tablet   Commonly known as: PROTONIX   Take 40 mg by mouth daily.      potassium chloride 10 MEQ CR tablet   Commonly known as: KLOR-CON   Take 20 mEq by mouth daily.      predniSONE 10 MG tablet   Commonly known as: STERAPRED UNI-PAK   Take 1 tablet (10 mg total) by mouth daily. Prednisone dosing: Take  Prednisone 40mg  (4 tabs) x 3 days, then taper to 30mg  (3 tabs) x 3 days, then 20mg  (2 tabs) x 3days, then 10mg  (1 tab) x 3days, then OFF.    Dispense:  30 tabs, refills: None      tiotropium 18 MCG inhalation capsule   Commonly known as: SPIRIVA   Place 1 capsule (18 mcg total) into inhaler and inhale daily.             Brief H and P: For complete details please refer to admission H and P, but in brief patient is a 66 year old female with history of COPD, coronary artery  disease and CHF presented to the emergency room her shortness of breath which had progressively worsened in 48 hours prior to presenting to the emergency room. Patient was admitted to further workup the dyspnea.  Hospital Course:    *Shortness of breath : Multifactorial likely secondary to severe COPD with chronic hypercarbic respiratory failure/respiratory acidosis, severe pulmonary hypertension and ongoing nicotine abuse. Patient was admitted to the hospitalist service and VQ scan was obtained which showed low probability for PE. 2-D echocardiogram was done which showed preserved EF with no regional wall motion abnormalities. Patient was placed on scheduled nebulizer treatments, IV Solu-Medrol, antibiotics, Flovent, IV Lasix.  Home O2 evaluation was done which showed patient qualifies for 3 L oxygen continuous at rest and ambulation however patient appears to be somewhat noncompliant. She states that she was on a home O2 previously but did not want to use it. She is actively smoking cigarettes and declined nicotine patches or smoking cessation. Patient was discharged on prednisone taper, by mouth doxycycline, and nebulizers, Flovent, oxygen via nasal cannula arranged by the home health. I have arranged  appointment with Dr. Delton Coombes, in the [facility] pulmonology office for severe COPD and severe pulmonary hypertension. Patient was started on revatio by cardiology service for severe pulmonary hypertension however she was not able to tolerate it.  Acute diastolic CHF exacerbation :  Cardiology was consulted and patient was placed on IV diuresis inpatient which significantly improved the symptoms. Lasix was increased to 40 mg twice a day for the home dose and she will followup with Dr. Rennis Golden in the office in 2 weeks. Patient also requested to be discharged today and okayed by cardiology service (discussed with Corine Shelter, P.A. who will arrange the followup appointment with Dr. Blanchie Dessert office.)   Day of  Discharge BP 109/62  Pulse 80  Temp(Src) 98.6 F (37 C) (Oral)  Resp 18  Ht 5\' 2"  (1.575 m)  Wt 58.151 kg (128 lb 3.2 oz)  BMI 23.45 kg/m2  SpO2 90%  Physical Exam: General: Alert and awake oriented x3 not in any acute distress. HEENT: anicteric sclera, pupils reactive to light and  accommodation CVS: S1-S2 clear no murmur rubs or gallops Chest: clear to auscultation bilaterally, no wheezing rales or rhonchi Abdomen: soft nontender, nondistended, normal bowel sounds, no organomegaly Extremities: no cyanosis, clubbing or edema noted bilaterally Neuro: Cranial nerves II-XII intact, no focal neurological deficits   The results of significant diagnostics from this hospitalization (including imaging, microbiology, ancillary and laboratory) are listed below for reference.    LAB RESULTS: Basic Metabolic Panel:  Lab 11/15/11 1610 11/14/11 0504 11/11/11 2110  NA 136 139 --  K 3.4* 4.0 --  CL 84* 90* --  CO2 >45* 44* --  GLUCOSE 85 103* --  BUN 25* 20 --  CREATININE 0.87 0.71 --  CALCIUM 9.7 9.5 --  MG -- -- 1.9  PHOS -- -- --   Liver Function Tests:  Lab 11/12/11 0306  AST 30  ALT 45*  ALKPHOS 148*  BILITOT 0.1*  PROT 6.6  ALBUMIN 2.6*   CBC:  Lab 11/15/11 0415 11/14/11 0504 11/12/11 0306  WBC 7.6 7.8 --  NEUTROABS -- -- 7.5  HGB 13.0 12.3 --  HCT 43.5 41.6 --  MCV 86.5 -- --  PLT 234 223 --   Cardiac Enzymes:  Lab 11/12/11 0306 11/11/11 1921  CKTOTAL 155 194*  CKMB 5.5* 5.5*  CKMBINDEX -- --  TROPONINI <0.30 0.35*   BNP: No components found with this basename: POCBNP:2 CBG: No results found for this basename: GLUCAP:2 in the last 168 hours  Significant Diagnostic Studies:  Dg Chest Port 1 View  11/10/2011  *RADIOLOGY REPORT*  Clinical Data: Shortness of breath, asthma, hypertension.  PORTABLE CHEST - 1 VIEW  Comparison: 08/01/2011  Findings: Cardiomegaly with vascular congestion.  Diffuse interstitial prominence throughout the lungs, similar to prior  study.  This could reflect mild interstitial edema or chronic interstitial lung disease.  Recommend clinical correlation.  No pleural effusions.  No acute bony abnormality.  IMPRESSION: Cardiomegaly with stable interstitial prominence, recurrent interstitial edema versus chronic lung disease.  Original Report Authenticated By: Cyndie Chime, M.D.     Disposition and Follow-up: Discharge Orders    Future Appointments: Provider: Department: Dept Phone: Center:   12/09/2011 2:30 PM Leslye Peer, MD [facility] [phone] None       DISPOSITION: home, homeRN was arranged for followup, home O2 arranged  DIET: heart healthy  ACTIVITY: as tolerated   DISCHARGE FOLLOW-UP Follow-up Information    Follow up with [facility], MD. Schedule an appointment as soon as possible for a visit in 10 days.   Contact information:   [address] [place] [address] [place] [address] [phone]       Follow up with Chrystie Nose, MD. Schedule an appointment as soon as possible for a visit in 2 weeks.   Contact information:   [address] [place] [address] [phone]       Follow up with Leslye Peer., MD on 12/09/2011. (at 2:30 PM)    Contact information:   [address] [employer] [place] [address] [phone]          Time spent on Discharge: 45 mins  Signed:  Gizelle Whetsel M.D. [facility] 11/15/2011, 10:38 AM

## 2011-11-15 NOTE — Progress Notes (Signed)
11/15/11 Patient had heart failure education and the teach back method was used. The patient will have home oxygen. Patient is eager to go home.

## 2011-11-15 NOTE — Progress Notes (Signed)
11-15-11  Received referral for [facility] RN and home 02 to be set up. Spoke with Ms Brazeau who declines me setting up [facility] RN but agreeable to home 02.  Chose [facility] for home 02. Called Lecretia with [facility] to make aware of the referral. Likely o2 will be delivered around 1pm. Made patient aware so she will call her dtr after o2 is delivered. PCP: Dr Elby Showers. Ms Albano declines me setting up follow up appt as well. States she will do so once she gets home. Also reports she will f/u with Cardiologist as well. She lives with her dtr and has walker at home if she needs it. No further needs assessed.    [place], [place] [phone]

## 2011-11-15 NOTE — Progress Notes (Signed)
11/15/11  Patient was 84% on room air while sitting in chair. Patient ambulated on room air and sats were 81%.

## 2011-11-17 ENCOUNTER — Telehealth: Payer: Self-pay | Admitting: Oncology

## 2011-11-17 NOTE — Telephone Encounter (Signed)
S/w the pt and she is aware of her march 2013 appts 

## 2011-12-09 ENCOUNTER — Inpatient Hospital Stay: Payer: Medicare Other | Admitting: Emergency Medicine

## 2011-12-17 ENCOUNTER — Ambulatory Visit (HOSPITAL_BASED_OUTPATIENT_CLINIC_OR_DEPARTMENT_OTHER): Payer: Medicare Other | Admitting: Oncology

## 2011-12-17 ENCOUNTER — Other Ambulatory Visit (HOSPITAL_BASED_OUTPATIENT_CLINIC_OR_DEPARTMENT_OTHER): Payer: Medicare Other | Admitting: Lab

## 2011-12-17 ENCOUNTER — Other Ambulatory Visit: Payer: Self-pay | Admitting: *Deleted

## 2011-12-17 VITALS — BP 102/65 | HR 92 | Temp 98.2°F | Ht 62.0 in | Wt 123.6 lb

## 2011-12-17 DIAGNOSIS — R0989 Other specified symptoms and signs involving the circulatory and respiratory systems: Secondary | ICD-10-CM

## 2011-12-17 DIAGNOSIS — F17200 Nicotine dependence, unspecified, uncomplicated: Secondary | ICD-10-CM

## 2011-12-17 DIAGNOSIS — C50919 Malignant neoplasm of unspecified site of unspecified female breast: Secondary | ICD-10-CM

## 2011-12-17 DIAGNOSIS — R0609 Other forms of dyspnea: Secondary | ICD-10-CM

## 2011-12-17 DIAGNOSIS — E559 Vitamin D deficiency, unspecified: Secondary | ICD-10-CM

## 2011-12-17 DIAGNOSIS — R110 Nausea: Secondary | ICD-10-CM

## 2011-12-17 LAB — CBC WITH DIFFERENTIAL/PLATELET
BASO%: 0.1 % (ref 0.0–2.0)
Basophils Absolute: 0 10*3/uL (ref 0.0–0.1)
EOS%: 1 % (ref 0.0–7.0)
Eosinophils Absolute: 0.1 10*3/uL (ref 0.0–0.5)
HCT: 43.1 % (ref 34.8–46.6)
HGB: 13.8 g/dL (ref 11.6–15.9)
LYMPH%: 12.2 % — ABNORMAL LOW (ref 14.0–49.7)
MCH: 27.8 pg (ref 25.1–34.0)
MCHC: 32.1 g/dL (ref 31.5–36.0)
MCV: 86.3 fL (ref 79.5–101.0)
MONO#: 0.3 10*3/uL (ref 0.1–0.9)
MONO%: 3.2 % (ref 0.0–14.0)
NEUT#: 6.8 10*3/uL — ABNORMAL HIGH (ref 1.5–6.5)
NEUT%: 83.5 % — ABNORMAL HIGH (ref 38.4–76.8)
Platelets: 341 10*3/uL (ref 145–400)
RBC: 4.99 10*6/uL (ref 3.70–5.45)
RDW: 24.8 % — ABNORMAL HIGH (ref 11.2–14.5)
WBC: 8.1 10*3/uL (ref 3.9–10.3)
lymph#: 1 10*3/uL (ref 0.9–3.3)

## 2011-12-17 MED ORDER — ONDANSETRON HCL 8 MG PO TABS: 8.0000 mg | ORAL_TABLET | Freq: Two times a day (BID) | ORAL | Status: DC | PRN

## 2011-12-17 NOTE — Progress Notes (Signed)
Hematology and Oncology Follow Up Visit  [name] [id number] [birth year] 66 y.o. 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  Interim History:  There have been no intercurrent illness, hospitalizations or medication changes.son dies of a sz 2 weeks ago.she was hospitilized prior to that with copd flare, diastolic chf.  Medications: I have reviewed the patient's current medications.  Allergies:  Allergies  Allergen Reactions  . Ceftriaxone Other (See Comments)    "Blow up like a balloon"    Past Medical History, Surgical history, Social history, and Family History were reviewed and updated.  Review of Systems: Constitutional:  Negative for fever, chills, night sweats, anorexia, weight loss, pain. Cardiovascular: negative Respiratory: no cough, shortness of breath, or wheezing Neurological: negative Dermatological: negative ENT: negative Skin Gastrointestinal: no abdominal pain, change in bowel habits, or black or bloody stools Genito-Urinary: negative Hematological and Lymphatic: negative Breast: negative Musculoskeletal: negative Remaining ROS negative.  Physical Exam: Blood pressure 102/65, pulse 92, temperature 98.2 F (36.8 C), height 5\' 2"  (1.575 m), weight 123 lb 9.6 oz (56.065 kg). ECOG: 0 General appearance: alert and cooperative Head: Normocephalic, without obvious abnormality, atraumatic Neck: no adenopathy, no carotid bruit, no JVD, supple, symmetrical, trachea midline and thyroid not enlarged, symmetric, no tenderness/mass/nodules Lymph nodes: Cervical, supraclavicular, and axillary nodes normal. Cardiac : regular rate and rhythm, no murmurs or gallops Pulmonary:clear to auscultation bilaterally and normal percussion bilaterally  Bibasilar rales Breasts: inspection negative, no nipple discharge or bleeding, no  masses or nodularity palpable Abdomen:soft, non-tender; bowel sounds normal; no masses,  no organomegaly Extremities negative Neuro: alert, oriented, normal speech, no focal findings or movement disorder noted  Lab Results: Lab Results  Component Value Date   WBC 8.1 12/17/2011   HGB 13.8 12/17/2011   HCT 43.1 12/17/2011   MCV 86.3 12/17/2011   PLT 341 12/17/2011     Chemistry      Component Value Date/Time   NA 136 11/15/2011 0415   K 3.4* 11/15/2011 0415   CL 84* 11/15/2011 0415   CO2 >45* 11/15/2011 0415   BUN 25* 11/15/2011 0415   CREATININE 0.87 11/15/2011 0415      Component Value Date/Time   CALCIUM 9.7 11/15/2011 0415   ALKPHOS 148* 11/12/2011 0306   AST 30 11/12/2011 0306   ALT 45* 11/12/2011 0306   BILITOT 0.1* 11/12/2011 0306      .pathology. Radiological Studies: chest X-ray n/a Mammogram Due 9/13 Bone density osteopenia  Impression and Plan: Genecis is doing well but has significan tcomorbid pblms, I have encouraged her to stop smoking. I will see her in 6 mo with f/u mamm. I will get her precertified for prolia as well.I have encouraged to continue to take vitamin D.  More than 50% of the visit was spent in patient-related counselling   Pierce Crane, MD 3/22/201311:44 AM

## 2011-12-18 LAB — COMPREHENSIVE METABOLIC PANEL
ALT: 23 U/L (ref 0–35)
AST: 23 U/L (ref 0–37)
Albumin: 3.8 g/dL (ref 3.5–5.2)
Alkaline Phosphatase: 87 U/L (ref 39–117)
BUN: 17 mg/dL (ref 6–23)
CO2: 34 mEq/L — ABNORMAL HIGH (ref 19–32)
Calcium: 10.1 mg/dL (ref 8.4–10.5)
Chloride: 99 mEq/L (ref 96–112)
Creatinine, Ser: 0.96 mg/dL (ref 0.50–1.10)
Glucose, Bld: 84 mg/dL (ref 70–99)
Potassium: 4.6 mEq/L (ref 3.5–5.3)
Sodium: 140 mEq/L (ref 135–145)
Total Bilirubin: 0.3 mg/dL (ref 0.3–1.2)
Total Protein: 6.9 g/dL (ref 6.0–8.3)

## 2011-12-18 LAB — VITAMIN D 25 HYDROXY (VIT D DEFICIENCY, FRACTURES): Vit D, 25-Hydroxy: 38 ng/mL (ref 30–89)

## 2012-01-30 ENCOUNTER — Emergency Department (HOSPITAL_COMMUNITY): Payer: Medicare Other

## 2012-01-30 ENCOUNTER — Inpatient Hospital Stay (HOSPITAL_COMMUNITY)
Admission: EM | Admit: 2012-01-30 | Discharge: 2012-02-03 | DRG: 194 | Disposition: A | Discharge status: Home / Self Care | Payer: Medicare Other | Attending: Internal Medicine | Admitting: Internal Medicine

## 2012-01-30 ENCOUNTER — Encounter (HOSPITAL_COMMUNITY): Payer: Self-pay | Admitting: *Deleted

## 2012-01-30 DIAGNOSIS — S32010A Wedge compression fracture of first lumbar vertebra, initial encounter for closed fracture: Secondary | ICD-10-CM | POA: Diagnosis present

## 2012-01-30 DIAGNOSIS — Z853 Personal history of malignant neoplasm of breast: Secondary | ICD-10-CM

## 2012-01-30 DIAGNOSIS — E860 Dehydration: Secondary | ICD-10-CM | POA: Diagnosis present

## 2012-01-30 DIAGNOSIS — J189 Pneumonia, unspecified organism: Principal | ICD-10-CM | POA: Diagnosis present

## 2012-01-30 DIAGNOSIS — E871 Hypo-osmolality and hyponatremia: Secondary | ICD-10-CM | POA: Diagnosis present

## 2012-01-30 DIAGNOSIS — F17200 Nicotine dependence, unspecified, uncomplicated: Secondary | ICD-10-CM | POA: Diagnosis present

## 2012-01-30 DIAGNOSIS — R7989 Other specified abnormal findings of blood chemistry: Secondary | ICD-10-CM | POA: Diagnosis present

## 2012-01-30 DIAGNOSIS — D72829 Elevated white blood cell count, unspecified: Secondary | ICD-10-CM

## 2012-01-30 DIAGNOSIS — E785 Hyperlipidemia, unspecified: Secondary | ICD-10-CM | POA: Diagnosis present

## 2012-01-30 DIAGNOSIS — C50919 Malignant neoplasm of unspecified site of unspecified female breast: Secondary | ICD-10-CM

## 2012-01-30 DIAGNOSIS — J449 Chronic obstructive pulmonary disease, unspecified: Secondary | ICD-10-CM

## 2012-01-30 DIAGNOSIS — I2789 Other specified pulmonary heart diseases: Secondary | ICD-10-CM | POA: Diagnosis present

## 2012-01-30 DIAGNOSIS — S32009A Unspecified fracture of unspecified lumbar vertebra, initial encounter for closed fracture: Secondary | ICD-10-CM | POA: Diagnosis present

## 2012-01-30 DIAGNOSIS — E559 Vitamin D deficiency, unspecified: Secondary | ICD-10-CM

## 2012-01-30 DIAGNOSIS — J9610 Chronic respiratory failure, unspecified whether with hypoxia or hypercapnia: Secondary | ICD-10-CM | POA: Diagnosis present

## 2012-01-30 DIAGNOSIS — R945 Abnormal results of liver function studies: Secondary | ICD-10-CM

## 2012-01-30 DIAGNOSIS — J4489 Other specified chronic obstructive pulmonary disease: Secondary | ICD-10-CM | POA: Diagnosis present

## 2012-01-30 DIAGNOSIS — R509 Fever, unspecified: Secondary | ICD-10-CM

## 2012-01-30 DIAGNOSIS — I509 Heart failure, unspecified: Secondary | ICD-10-CM | POA: Diagnosis present

## 2012-01-30 DIAGNOSIS — X58XXXA Exposure to other specified factors, initial encounter: Secondary | ICD-10-CM | POA: Diagnosis present

## 2012-01-30 DIAGNOSIS — I5032 Chronic diastolic (congestive) heart failure: Secondary | ICD-10-CM | POA: Diagnosis present

## 2012-01-30 DIAGNOSIS — I272 Pulmonary hypertension, unspecified: Secondary | ICD-10-CM | POA: Diagnosis present

## 2012-01-30 DIAGNOSIS — Z9981 Dependence on supplemental oxygen: Secondary | ICD-10-CM

## 2012-01-30 HISTORY — DX: Unspecified diastolic (congestive) heart failure: I50.30

## 2012-01-30 HISTORY — DX: Pulmonary hypertension, unspecified: I27.20

## 2012-01-30 LAB — URINALYSIS, ROUTINE W REFLEX MICROSCOPIC
Glucose, UA: NEGATIVE mg/dL
Hgb urine dipstick: NEGATIVE
Leukocytes, UA: NEGATIVE
Nitrite: NEGATIVE
Protein, ur: NEGATIVE mg/dL
Specific Gravity, Urine: 1.02 (ref 1.005–1.030)
Urobilinogen, UA: 1 mg/dL (ref 0.0–1.0)
pH: 6 (ref 5.0–8.0)

## 2012-01-30 LAB — DIFFERENTIAL
Basophils Absolute: 0 10*3/uL (ref 0.0–0.1)
Basophils Relative: 0 % (ref 0–1)
Eosinophils Absolute: 0.1 10*3/uL (ref 0.0–0.7)
Eosinophils Relative: 0 % (ref 0–5)
Lymphocytes Relative: 9 % — ABNORMAL LOW (ref 12–46)
Lymphs Abs: 1 10*3/uL (ref 0.7–4.0)
Monocytes Absolute: 0.8 10*3/uL (ref 0.1–1.0)
Monocytes Relative: 7 % (ref 3–12)
Neutro Abs: 9.5 10*3/uL — ABNORMAL HIGH (ref 1.7–7.7)
Neutrophils Relative %: 83 % — ABNORMAL HIGH (ref 43–77)

## 2012-01-30 LAB — COMPREHENSIVE METABOLIC PANEL
ALT: 78 U/L — ABNORMAL HIGH (ref 0–35)
AST: 71 U/L — ABNORMAL HIGH (ref 0–37)
Albumin: 2.8 g/dL — ABNORMAL LOW (ref 3.5–5.2)
Alkaline Phosphatase: 292 U/L — ABNORMAL HIGH (ref 39–117)
BUN: 19 mg/dL (ref 6–23)
CO2: 27 mEq/L (ref 19–32)
Calcium: 9.5 mg/dL (ref 8.4–10.5)
Chloride: 95 mEq/L — ABNORMAL LOW (ref 96–112)
Creatinine, Ser: 1.12 mg/dL — ABNORMAL HIGH (ref 0.50–1.10)
GFR calc Af Amer: 58 mL/min — ABNORMAL LOW (ref >90)
GFR calc non Af Amer: 50 mL/min — ABNORMAL LOW (ref >90)
Glucose, Bld: 109 mg/dL — ABNORMAL HIGH (ref 70–99)
Potassium: 4.7 mEq/L (ref 3.5–5.1)
Sodium: 132 mEq/L — ABNORMAL LOW (ref 135–145)
Total Bilirubin: 0.3 mg/dL (ref 0.3–1.2)
Total Protein: 7.5 g/dL (ref 6.0–8.3)

## 2012-01-30 LAB — CBC
HCT: 36.9 % (ref 36.0–46.0)
Hemoglobin: 11.7 g/dL — ABNORMAL LOW (ref 12.0–15.0)
MCH: 30.2 pg (ref 26.0–34.0)
MCHC: 31.7 g/dL (ref 30.0–36.0)
MCV: 95.3 fL (ref 78.0–100.0)
Platelets: 259 10*3/uL (ref 150–400)
RBC: 3.87 MIL/uL (ref 3.87–5.11)
RDW: 19.3 % — ABNORMAL HIGH (ref 11.5–15.5)
WBC: 11.4 10*3/uL — ABNORMAL HIGH (ref 4.0–10.5)

## 2012-01-30 LAB — LACTIC ACID, PLASMA: Lactic Acid, Venous: 0.6 mmol/L (ref 0.5–2.2)

## 2012-01-30 MED ORDER — BUTALBITAL-APAP-CAFFEINE 50-325-40 MG PO TABS
2.0000 | ORAL_TABLET | Freq: Four times a day (QID) | ORAL | Status: DC | PRN
  Administered 2012-01-30: 2 via ORAL
  Filled 2012-01-30: qty 2

## 2012-01-30 MED ORDER — ACETAMINOPHEN 325 MG PO TABS
650.0000 mg | ORAL_TABLET | Freq: Once | ORAL | Status: DC
  Filled 2012-01-30: qty 2

## 2012-01-30 MED ORDER — SODIUM CHLORIDE 0.9 % IV SOLN
INTRAVENOUS | Status: DC
  Administered 2012-01-30: 18:00:00 via INTRAVENOUS

## 2012-01-30 NOTE — ED Notes (Signed)
Pt unable to urinate at this time. Attempted to use bedpan. Pt A & O. C/o HA, lights dimmed for comfort. Call bell at bedside.

## 2012-01-30 NOTE — ED Notes (Signed)
NFA:OZHY<QM> Expected date:01/30/12<BR> Expected time: 4:12 PM<BR> Means of arrival:Ambulance<BR> Comments:<BR> M41. 66 f. CApt, fever, ALOC, pain in leg, UTI symptoms. 15 mins

## 2012-01-30 NOTE — ED Notes (Signed)
Went in patient room she still unable to give urine sample at this time.

## 2012-01-30 NOTE — ED Notes (Signed)
Pt c/o back, leg and flank pain, fever, 800 mg Ibuprofen given by EMS, #20 Left hand with KVO NS, ST on monitor 130, Room air sat 80's, 4 L [place] sats 90's.

## 2012-01-30 NOTE — ED Notes (Signed)
Patient reports that she has had right arm, right leg, and right lower back pain x 1 week. Patient states pain is chronic, but has had increased pain x 1 week. Patient denies any injury or heavy lifting.

## 2012-01-30 NOTE — ED Provider Notes (Signed)
History     CSN: [id number]  Arrival date & time 01/30/12  1620   First MD Initiated Contact with Patient 01/30/12 1722      Chief Complaint  Patient presents with  . Flank Pain  . Back Pain     HPI Patient reports that she has had  back pain x 1 week. Patient states pain is chronic, but has had increased pain x 1 week. Patient denies any injury or heavy lifting.  Patient had fever up to 105 at home.  Has had coughing.  Has been on one round of antibiotics is now just started her second round.  Denies any diarrhea or vomiting.  Denies headache or neck pain.  Denies sore throat or skin lesions.  Past Medical History  Diagnosis Date  . Angina   . Heart murmur   . Cancer of breast unknown    RT Breast  . Depressed   . Coronary artery disease 2006    2 stents w/previous MI  . Hypertension   . Migraine   . Anxiety   . Myocardial infarction 2006  . COPD (chronic obstructive pulmonary disease)   . Moderate to severe pulmonary hypertension   . Diastolic CHF     Past Surgical History  Procedure Date  . Breast lumpectomy     Right  . Cardiac catheterization 2006  . Cholecystectomy   . Abdominal surgery     No family history on file.  History  Substance Use Topics  . Smoking status: Current Everyday Smoker -- 47 years    Types: Cigarettes  . Smokeless tobacco: Never Used  . Alcohol Use: Yes    OB History    Grav Para Term Preterm Abortions TAB SAB Ect Mult Living                  Review of Systems  All other systems reviewed and are negative.    Allergies  Ceftriaxone  Home Medications   Current Outpatient Rx  Name Route Sig Dispense Refill  . ALBUTEROL SULFATE HFA 108 (90 BASE) MCG/ACT IN AERS Inhalation Inhale 2 puffs into the lungs every 4 (four) hours as needed. Shortness of breath and wheezing  Patient is using Pro-Air brand    . ALPRAZOLAM 1 MG PO TABS Oral Take 1 mg by mouth 3 (three) times daily as needed. For anxitey     . ANASTROZOLE 1 MG PO  TABS Oral Take 1 mg by mouth daily.      . ASPIRIN 325 MG PO TABS Oral Take 325 mg by mouth daily.      . ATORVASTATIN CALCIUM 80 MG PO TABS Oral Take 80 mg by mouth at bedtime.      Marland Kitchen BUTALBITAL-APAP-CAFF-COD 50-325-40-30 MG PO CAPS Oral Take 1 capsule by mouth at bedtime as needed.    Marland Kitchen BUTALBITAL-APAP-CAFFEINE 50-325-40 MG PO TABS Oral Take 1 tablet by mouth every 4 (four) hours as needed. For headaches     . CALCIUM CARBONATE-VITAMIN D 250-125 MG-UNIT PO TABS Oral Take 1 tablet by mouth daily.    Marland Kitchen DICLOFENAC SODIUM 1 % TD GEL Topical Apply 1 application topically daily as needed. Hand pain    . ENALAPRIL MALEATE 5 MG PO TABS Oral Take 5 mg by mouth 2 (two) times daily.    Marland Kitchen FLUTICASONE PROPIONATE  HFA 220 MCG/ACT IN AERO Inhalation Inhale 2 puffs into the lungs 2 (two) times daily. For shortness of breath 1 Inhaler 3  . FUROSEMIDE 40 MG  PO TABS Oral Take 1 tablet (40 mg total) by mouth 2 (two) times daily. 60 tablet 3  . IPRATROPIUM BROMIDE HFA 17 MCG/ACT IN AERS Inhalation Inhale 2 puffs into the lungs every 4 (four) hours as needed. For shortness of breath     . IPRATROPIUM-ALBUTEROL 0.5-2.5 (3) MG/3ML IN SOLN Nebulization Take 3 mLs by nebulization every 4 (four) hours as needed.    Marland Kitchen LACTULOSE 10 GM/15ML PO SOLN Oral Take 30 g by mouth as needed.      Marland Kitchen MONTELUKAST SODIUM 10 MG PO TABS Oral Take 10 mg by mouth at bedtime.      . MULTI-VITAMIN/MINERALS PO TABS Oral Take 1 tablet by mouth daily.    Marland Kitchen NORTRIPTYLINE HCL 25 MG PO CAPS Oral Take 25 mg by mouth at bedtime.      . OMEGA-3-ACID ETHYL ESTERS 1 G PO CAPS Oral Take 1 g by mouth daily.    Marland Kitchen ONDANSETRON HCL 8 MG PO TABS Oral Take 1 tablet (8 mg total) by mouth every 12 (twelve) hours as needed. 30 tablet 3  . PANTOPRAZOLE SODIUM 40 MG PO TBEC Oral Take 40 mg by mouth daily.    Marland Kitchen POTASSIUM CHLORIDE CRYS ER 20 MEQ PO TBCR Oral Take 20 mEq by mouth daily.      BP 93/58  Pulse 92  Temp(Src) 100.1 F (37.8 C) (Oral)  Resp 22  SpO2  96%  Physical Exam  Nursing note and vitals reviewed. Constitutional: She is oriented to person, place, and time. She appears well-developed and well-nourished. No distress.  HENT:  Head: Normocephalic and atraumatic.  Eyes: Pupils are equal, round, and reactive to light.  Neck: Normal range of motion.  Cardiovascular: Normal rate and intact distal pulses.   Pulmonary/Chest: No respiratory distress.  Abdominal: Normal appearance. She exhibits no distension. There is no tenderness. There is no rebound and no guarding.  Musculoskeletal: Normal range of motion.  Neurological: She is alert and oriented to person, place, and time. No cranial nerve deficit.  Skin: Skin is warm and dry. No rash noted.  Psychiatric: She has a normal mood and affect. Her behavior is normal.    ED Course  Procedures (including critical care time) Scheduled Meds:    . acetaminophen  650 mg Oral Once   Continuous Infusions:    . sodium chloride 125 mL/hr at 01/30/12 1740   PRN Meds:.  Labs Reviewed  URINALYSIS, ROUTINE W REFLEX MICROSCOPIC - Abnormal; Notable for the following:    Color, Urine AMBER (*) BIOCHEMICALS MAY BE AFFECTED BY COLOR   Bilirubin Urine SMALL (*)    Ketones, ur TRACE (*)    All other components within normal limits  CBC - Abnormal; Notable for the following:    WBC 11.4 (*)    Hemoglobin 11.7 (*)    RDW 19.3 (*)    All other components within normal limits  DIFFERENTIAL - Abnormal; Notable for the following:    Neutrophils Relative 83 (*)    Neutro Abs 9.5 (*)    Lymphocytes Relative 9 (*)    All other components within normal limits  COMPREHENSIVE METABOLIC PANEL - Abnormal; Notable for the following:    Sodium 132 (*)    Chloride 95 (*)    Glucose, Bld 109 (*)    Creatinine, Ser 1.12 (*)    Albumin 2.8 (*)    AST 71 (*)    ALT 78 (*)    Alkaline Phosphatase 292 (*)    GFR  calc non Af Amer 50 (*)    GFR calc Af Amer 58 (*)    All other components within normal  limits  LACTIC ACID, PLASMA  URINE CULTURE  CULTURE, BLOOD (ROUTINE X 2)  CULTURE, BLOOD (ROUTINE X 2)   Dg Chest 2 View  01/30/2012  *RADIOLOGY REPORT*  Clinical Data: Shortness of breath and fatigue.  CHEST - 2 VIEW  Comparison: 11/10/2011.  Findings: Stable cardiac enlargement.  The pulmonary hila are stable.  Stable emphysematous changes and chronic bronchitic changes.  No infiltrates, edema or effusions.  The bony thorax is intact.  IMPRESSION: Chronic emphysematous and bronchitic lung changes without definite acute overlying pulmonary process.  Original Report Authenticated By: P. Loralie Champagne, M.D.     1. Fever   2. Leukocytosis   3. Abnormal LFTs       MDM         Nelia Shi, MD 01/30/12 2253

## 2012-01-30 NOTE — H&P (Signed)
History and Physical  [name] ZOX:[id number] DOB: January 17, 1946 DOA: 01/30/2012  Referring physician: Nelva Nay, MD PCP: Elby Showers, MD, MD  Pulmonologist: Levy Pupa, M.D. Cardiologist: [place] Hilty, M.D.  Chief Complaint: Cough  HPI:  66 year old woman presented to the emergency department with a history of fever, cough and shortness of breath. Symptoms began about 2 weeks ago. She was treated with a course of Augmentin without relief. She was again started on Augmentin today but as she is continued to have some fever and cough she presents the emergency department for further evaluation.  She has had some back pain which she associates with cough. There is notation in the emergency department record of leg and flank pain however I do not elicit this. In the emergency department she was noted be tachycardic, to have a low-grade fever and initial workup was unrevealing. In discussion with emergency department physician at that time I suggested empiric antibiotic therapy.  In the emergency department was noted to have temperature of 100.1, hypotension and mild tachycardia. Laboratory studies notable for elevated alkaline phosphatase, AST, ALT. These elevations of been seen February 2013, November 2012, October 2012. CBC notable for leukocytosis of 11.4. Urinalysis negative. Blood cultures pending. Chest x-ray unremarkable. CT of abdomen and pelvis notable for atypical lung infection.  Chart Review:  12/17/2011: Oncology office visit: Principal diagnoses: multinode + breast cancer T1CN2a, s/p herceptin/chemo complicated by bowel infarction, s/p xrt to chest wall and axilla 5/10. Noted to be doing well at that time.  February 2013 hospitalization: Acute/chronic respiratory failure secondary to COPD, acute diastolic congestive heart failure, pulmonary hypertension. She presented with shortness of breath. Felt to be multifactorial including severe COPD, chronic hypercarbic respiratory  failure/respiratory acidosis, severe pulmonary hypertension and ongoing nicotine abuse.  Review of Systems:  Negative for changes to her vision, sore throat, rash, new muscle aches, chest pain, dysuria, bleeding, nausea, vomiting, abdominal pain.  Past Medical History  Diagnosis Date  . Angina   . Heart murmur   . Cancer of breast unknown    RT Breast  . Depressed   . Coronary artery disease 2006    2 stents w/previous MI  . Hypertension   . Migraine   . Anxiety   . Myocardial infarction 2006  . COPD (chronic obstructive pulmonary disease)   . Moderate to severe pulmonary hypertension   . Diastolic CHF    Past Surgical History  Procedure Date  . Breast lumpectomy     Right  . Cardiac catheterization 2006  . Cholecystectomy   . Abdominal surgery    Social History:  reports that she has been smoking Cigarettes.  She has smoked for the past 47 years. She has never used smokeless tobacco. She reports that she drinks alcohol. She reports that she does not use illicit drugs.  Allergies  Allergen Reactions  . Ceftriaxone Other (See Comments)    "Blow up like a balloon"   Family History  Problem Relation Age of Onset  . Schizophrenia Sister    Prior to Admission medications   Medication Sig Start Date End Date Taking? Authorizing Provider  albuterol (PROVENTIL HFA;VENTOLIN HFA) 108 (90 BASE) MCG/ACT inhaler Inhale 2 puffs into the lungs every 4 (four) hours as needed. Shortness of breath and wheezing  Patient is using Pro-Air brand   Yes Historical Provider, MD  ALPRAZolam Prudy Feeler) 1 MG tablet Take 1 mg by mouth 3 (three) times daily as needed. For anxitey    Yes Historical Provider, MD  anastrozole (ARIMIDEX) 1  MG tablet Take 1 mg by mouth daily.     Yes Historical Provider, MD  aspirin 325 MG tablet Take 325 mg by mouth daily.     Yes Historical Provider, MD  atorvastatin (LIPITOR) 80 MG tablet Take 80 mg by mouth at bedtime.     Yes Historical Provider, MD    butalbital-acetaminophen-caffeine (FIORICET WITH CODEINE) 50-325-40-30 MG per capsule Take 1 capsule by mouth at bedtime as needed.   Yes Historical Provider, MD  butalbital-acetaminophen-caffeine (FIORICET, ESGIC) 50-325-40 MG per tablet Take 1 tablet by mouth every 4 (four) hours as needed. For headaches    Yes Historical Provider, MD  calcium-vitamin D (OSCAL WITH D) 250-125 MG-UNIT per tablet Take 1 tablet by mouth daily.   Yes Historical Provider, MD  diclofenac sodium (VOLTAREN) 1 % GEL Apply 1 application topically daily as needed. Hand pain   Yes Historical Provider, MD  enalapril (VASOTEC) 5 MG tablet Take 5 mg by mouth 2 (two) times daily.   Yes Historical Provider, MD  fluticasone (FLOVENT HFA) 220 MCG/ACT inhaler Inhale 2 puffs into the lungs 2 (two) times daily. For shortness of breath 11/14/11  Yes Ripudeep Jenna Luo, MD  furosemide (LASIX) 40 MG tablet Take 1 tablet (40 mg total) by mouth 2 (two) times daily. 11/14/11 11/13/12 Yes Ripudeep Jenna Luo, MD  ipratropium (ATROVENT HFA) 17 MCG/ACT inhaler Inhale 2 puffs into the lungs every 4 (four) hours as needed. For shortness of breath    Yes Historical Provider, MD  ipratropium-albuterol (DUONEB) 0.5-2.5 (3) MG/3ML SOLN Take 3 mLs by nebulization every 4 (four) hours as needed.   Yes Historical Provider, MD  lactulose (CHRONULAC) 10 GM/15ML solution Take 30 g by mouth as needed.     Yes Historical Provider, MD  montelukast (SINGULAIR) 10 MG tablet Take 10 mg by mouth at bedtime.     Yes Historical Provider, MD  Multiple Vitamins-Minerals (MULTIVITAMIN WITH MINERALS) tablet Take 1 tablet by mouth daily.   Yes Historical Provider, MD  nortriptyline (PAMELOR) 25 MG capsule Take 25 mg by mouth at bedtime.     Yes Historical Provider, MD  omega-3 acid ethyl esters (LOVAZA) 1 G capsule Take 1 g by mouth daily.   Yes Historical Provider, MD  ondansetron (ZOFRAN) 8 MG tablet Take 1 tablet (8 mg total) by mouth every 12 (twelve) hours as needed. 12/17/11   Yes Pierce Crane, MD  pantoprazole (PROTONIX) 40 MG tablet Take 40 mg by mouth daily.   Yes Historical Provider, MD  potassium chloride SA (K-DUR,KLOR-CON) 20 MEQ tablet Take 20 mEq by mouth daily.   Yes Historical Provider, MD   Physical Exam: Filed Vitals:   01/30/12 1700 01/30/12 1703 01/30/12 1830 01/30/12 1903  BP: 89/65 95/47 127/52 93/58  Pulse: 104 105 96 92  Temp:      TempSrc:      Resp: 20 21 20 22   SpO2: 98% 97% 96% 96%    General:  Appears calm and comfortable. Exam and in the emergency department.  Eyes: Pupils equal and round. Lids and irises appear unremarkable.  ENT: Hearing grossly normal. Lips and tongue appear unremarkable.  Neck: No lymphadenopathy or masses. No thyromegaly.  Cardiovascular: Regular rate and rhythm. No murmur, rub, gallop. No lower extremity edema.  Respiratory: Bilateral rhonchi all fields. No frank wheezing or rales. Fair air movement. Normal respiratory effort.  Abdomen: Soft, nontender, nondistended. Abdominal wall hernia is noted, nontender and easily reducible.  Skin: Appears grossly unremarkable.  Musculoskeletal: Tone upper  and lower choice appears to be grossly normal. Able to sit up without difficulty.  Psychiatric: Grossly normal mood and affect. Speech fluent and appropriate.  Labs on Admission:  Basic Metabolic Panel:  Lab 01/30/12 1610  NA 132*  K 4.7  CL 95*  CO2 27  GLUCOSE 109*  BUN 19  CREATININE 1.12*  CALCIUM 9.5  MG --  PHOS --   Liver Function Tests:  Lab 01/30/12 1845  AST 71*  ALT 78*  ALKPHOS 292*  BILITOT 0.3  PROT 7.5  ALBUMIN 2.8*   CBC:  Lab 01/30/12 1845  WBC 11.4*  NEUTROABS 9.5*  HGB 11.7*  HCT 36.9  MCV 95.3  PLT 259   BNP:    Component Value Date/Time   PROBNP 3023.0* 11/15/2011 0415   Radiological Exams on Admission: Ct Abdomen Pelvis Wo Contrast  01/30/2012  *RADIOLOGY REPORT*  Clinical Data: Nausea and epigastric abdominal pain.  CT ABDOMEN AND PELVIS WITHOUT CONTRAST   Technique:  Multidetector CT imaging of the abdomen and pelvis was performed following the standard protocol without intravenous contrast.  Comparison: None.  Findings: Patchy peripheral airspace opacities are noted at the lung bases, with scattered nodularity and diffuse interstitial prominence.  The most prominent nodule measures 5 mm at the left lung base (image 6 of 24).  The appearance raises concern for an underlying atypical infectious process.  The liver and spleen are unremarkable in appearance.  The gallbladder is within normal limits.  The pancreas and adrenal glands are unremarkable.  The kidneys are unremarkable in appearance.  There is no evidence of hydronephrosis.  No renal or ureteral stones are seen.  No perinephric stranding is appreciated.  No free fluid is identified.  The small bowel is unremarkable in appearance.  The stomach is within normal limits.  No acute vascular abnormalities are seen.  There is diffuse calcification along the abdominal aorta and its branches, with mild ectasia at the distal abdominal aorta but no evidence of aneurysmal dilatation.  There is a small to moderate anterior abdominal wall hernia noted inferiorly, containing a segment of small bowel and demonstrate mild soft tissue inflammation.  There is no evidence for bowel obstruction.  The patient appears status post appendectomy; postoperative change is noted about the ascending colon, with an associated bowel suture line.  The colon is largely filled with stool.  The sigmoid colon is mildly redundant.  The bladder is mildly distended and grossly unremarkable in appearance.  The uterus is grossly unremarkable.  The ovaries are relatively symmetric.  No suspicious adnexal masses are seen.  No inguinal lymphadenopathy is seen.  No acute osseous abnormalities are identified.  Chronic compression deformities are noted at vertebral bodies L1 and L3; there is mild grade 1 anterolisthesis of L4 on L5.  Facet disease is noted  along the lumbar spine.  IMPRESSION:  1.  No acute abnormality seen within the abdomen or pelvis. 2.  Patchy peripheral airspace opacities at the lung bases, with scattered nodularity and diffuse interstitial prominence.  The largest nodule measures 5 mm in size.  The appearance raises concern for an underlying atypical infectious process. 3.  Small to moderate anterior abdominal wall hernia noted inferiorly, containing a short segment of small bowel and demonstrating mild soft tissue inflammation.  No evidence for bowel obstruction. 4.  Diffuse calcification along the abdominal aorta and its branches, with mild ectasia at the distal abdominal aorta but no evidence of aneurysmal dilatation. 5.  Chronic compression deformities at L1 and  L3, with additional mild degenerative change noted along the lower lumbar spine.  Original Report Authenticated By: Tonia Ghent, M.D.   Dg Chest 2 View  01/30/2012  *RADIOLOGY REPORT*  Clinical Data: Shortness of breath and fatigue.  CHEST - 2 VIEW  Comparison: 11/10/2011.  Findings: Stable cardiac enlargement.  The pulmonary hila are stable.  Stable emphysematous changes and chronic bronchitic changes.  No infiltrates, edema or effusions.  The bony thorax is intact.  IMPRESSION: Chronic emphysematous and bronchitic lung changes without definite acute overlying pulmonary process.  Original Report Authenticated By: P. Loralie Champagne, M.D.   Assessment/Plan 1. Pneumonia: By CT likely atypical. This finding fits with her history of being treated with Augmentin without improvement. Levaquin. No reason to suspect MRSA or multidrug resistant organisms at this time. Blood pressure is borderline but lactic acid was normal and there is no reason to suspect sepsis at this point. 2. Dehydration: Limited IV fluids. Repeat basic metabolic panel in the morning. 3. Mild hyponatremia: Probably secondary to dehydration. IV fluids. 4. Abnormal liver function tests: These elevations have been  seen February 2013, November 2012, October 2012. Etiology and significance unclear. Consider outpatient evaluation. 5. Anterior abdominal wall hernia: Freely reducible and nontender. No further evaluation. 6. Chronic respiratory failure/COPD: Appears clinically stable at this point. Baseline oxygen dependent 4 L per minute nasal cannula. 7. Chronic diastolic congestive heart failure: Appears compensated. Chronic elevation of BNP. Resume Lasix this evening. 8. Severe pulmonary hypertension 9. Cigarette smoker: I recommended cessation.  Code Status: Full code Family Communication: None at bedside Disposition Plan: Pending further evaluation and treatment  Brendia Sacks, MD  [facility] Pager [phone] 01/30/2012, 10:27 PM

## 2012-01-31 ENCOUNTER — Inpatient Hospital Stay (HOSPITAL_COMMUNITY): Payer: Medicare Other

## 2012-01-31 ENCOUNTER — Encounter (HOSPITAL_COMMUNITY): Payer: Self-pay | Admitting: Family Medicine

## 2012-01-31 DIAGNOSIS — E871 Hypo-osmolality and hyponatremia: Secondary | ICD-10-CM

## 2012-01-31 DIAGNOSIS — J189 Pneumonia, unspecified organism: Secondary | ICD-10-CM | POA: Diagnosis present

## 2012-01-31 DIAGNOSIS — J438 Other emphysema: Secondary | ICD-10-CM

## 2012-01-31 DIAGNOSIS — E860 Dehydration: Secondary | ICD-10-CM | POA: Diagnosis present

## 2012-01-31 DIAGNOSIS — I5032 Chronic diastolic (congestive) heart failure: Secondary | ICD-10-CM | POA: Diagnosis present

## 2012-01-31 HISTORY — DX: Hypo-osmolality and hyponatremia: E87.1

## 2012-01-31 LAB — COMPREHENSIVE METABOLIC PANEL
ALT: 59 U/L — ABNORMAL HIGH (ref 0–35)
AST: 51 U/L — ABNORMAL HIGH (ref 0–37)
Albumin: 2.4 g/dL — ABNORMAL LOW (ref 3.5–5.2)
Alkaline Phosphatase: 230 U/L — ABNORMAL HIGH (ref 39–117)
BUN: 19 mg/dL (ref 6–23)
CO2: 27 mEq/L (ref 19–32)
Calcium: 9.2 mg/dL (ref 8.4–10.5)
Chloride: 94 mEq/L — ABNORMAL LOW (ref 96–112)
Creatinine, Ser: 1.05 mg/dL (ref 0.50–1.10)
GFR calc Af Amer: 63 mL/min — ABNORMAL LOW (ref >90)
GFR calc non Af Amer: 54 mL/min — ABNORMAL LOW (ref >90)
Glucose, Bld: 83 mg/dL (ref 70–99)
Potassium: 4.5 mEq/L (ref 3.5–5.1)
Sodium: 129 mEq/L — ABNORMAL LOW (ref 135–145)
Total Bilirubin: 0.3 mg/dL (ref 0.3–1.2)
Total Protein: 6.5 g/dL (ref 6.0–8.3)

## 2012-01-31 LAB — CBC
HCT: 33.8 % — ABNORMAL LOW (ref 36.0–46.0)
Hemoglobin: 10.7 g/dL — ABNORMAL LOW (ref 12.0–15.0)
MCH: 30 pg (ref 26.0–34.0)
MCHC: 31.7 g/dL (ref 30.0–36.0)
MCV: 94.7 fL (ref 78.0–100.0)
Platelets: 224 10*3/uL (ref 150–400)
RBC: 3.57 MIL/uL — ABNORMAL LOW (ref 3.87–5.11)
RDW: 19 % — ABNORMAL HIGH (ref 11.5–15.5)
WBC: 8.1 10*3/uL (ref 4.0–10.5)

## 2012-01-31 LAB — EXPECTORATED SPUTUM ASSESSMENT W GRAM STAIN, RFLX TO RESP C

## 2012-01-31 LAB — URINE CULTURE
Colony Count: NO GROWTH
Culture  Setup Time: 201305060233
Culture: NO GROWTH

## 2012-01-31 MED ORDER — SODIUM CHLORIDE 0.9 % IV SOLN
INTRAVENOUS | Status: DC
  Administered 2012-01-31 – 2012-02-01 (×2): via INTRAVENOUS

## 2012-01-31 MED ORDER — ALBUTEROL SULFATE (5 MG/ML) 0.5% IN NEBU
2.5000 mg | INHALATION_SOLUTION | Freq: Three times a day (TID) | RESPIRATORY_TRACT | Status: DC
  Administered 2012-01-31 – 2012-02-03 (×10): 2.5 mg via RESPIRATORY_TRACT
  Filled 2012-01-31 (×10): qty 0.5

## 2012-01-31 MED ORDER — ALBUTEROL SULFATE (5 MG/ML) 0.5% IN NEBU
2.5000 mg | INHALATION_SOLUTION | RESPIRATORY_TRACT | Status: DC | PRN
  Filled 2012-01-31: qty 0.5

## 2012-01-31 MED ORDER — IPRATROPIUM BROMIDE 0.02 % IN SOLN: 0.5000 mg | Freq: Three times a day (TID) | RESPIRATORY_TRACT | Status: DC

## 2012-01-31 MED ORDER — LEVOFLOXACIN IN D5W 750 MG/150ML IV SOLN
750.0000 mg | Freq: Every day | INTRAVENOUS | Status: DC
  Administered 2012-01-31: 750 mg via INTRAVENOUS
  Filled 2012-01-31 (×2): qty 150

## 2012-01-31 MED ORDER — BUTALBITAL-APAP-CAFFEINE 50-325-40 MG PO TABS: 1.0000 | ORAL_TABLET | Freq: Every evening | ORAL | Status: DC | PRN

## 2012-01-31 MED ORDER — ALBUTEROL SULFATE (5 MG/ML) 0.5% IN NEBU: 2.5000 mg | INHALATION_SOLUTION | RESPIRATORY_TRACT | Status: DC

## 2012-01-31 MED ORDER — IOHEXOL 300 MG/ML  SOLN
80.0000 mL | Freq: Once | INTRAMUSCULAR | Status: AC | PRN
  Administered 2012-01-31: 80 mL via INTRAVENOUS

## 2012-01-31 MED ORDER — LACTULOSE 10 GM/15ML PO SOLN
30.0000 g | Freq: Every day | ORAL | Status: DC | PRN
  Administered 2012-01-31 – 2012-02-02 (×3): 30 g via ORAL
  Filled 2012-01-31 (×4): qty 30

## 2012-01-31 MED ORDER — NORTRIPTYLINE HCL 25 MG PO CAPS
25.0000 mg | ORAL_CAPSULE | Freq: Every day | ORAL | Status: DC
  Administered 2012-01-31 – 2012-02-02 (×3): 25 mg via ORAL
  Filled 2012-01-31 (×4): qty 1

## 2012-01-31 MED ORDER — FLUTICASONE PROPIONATE HFA 220 MCG/ACT IN AERO
2.0000 | INHALATION_SPRAY | Freq: Two times a day (BID) | RESPIRATORY_TRACT | Status: DC
  Administered 2012-01-31 – 2012-02-03 (×7): 2 via RESPIRATORY_TRACT
  Filled 2012-01-31: qty 12

## 2012-01-31 MED ORDER — MONTELUKAST SODIUM 10 MG PO TABS
10.0000 mg | ORAL_TABLET | Freq: Every day | ORAL | Status: DC
  Administered 2012-01-31 – 2012-02-02 (×3): 10 mg via ORAL
  Filled 2012-01-31 (×4): qty 1

## 2012-01-31 MED ORDER — SODIUM CHLORIDE 0.9 % IV SOLN
INTRAVENOUS | Status: AC
  Administered 2012-01-31: 02:00:00 via INTRAVENOUS

## 2012-01-31 MED ORDER — PANTOPRAZOLE SODIUM 40 MG PO TBEC
40.0000 mg | DELAYED_RELEASE_TABLET | Freq: Every day | ORAL | Status: DC
  Administered 2012-01-31 – 2012-02-02 (×3): 40 mg via ORAL
  Filled 2012-01-31 (×5): qty 1

## 2012-01-31 MED ORDER — POTASSIUM CHLORIDE CRYS ER 20 MEQ PO TBCR
20.0000 meq | EXTENDED_RELEASE_TABLET | Freq: Every day | ORAL | Status: DC
  Administered 2012-01-31 – 2012-02-02 (×3): 20 meq via ORAL
  Filled 2012-01-31 (×4): qty 1

## 2012-01-31 MED ORDER — IPRATROPIUM BROMIDE 0.02 % IN SOLN: 0.5000 mg | RESPIRATORY_TRACT | Status: DC

## 2012-01-31 MED ORDER — ANASTROZOLE 1 MG PO TABS
1.0000 mg | ORAL_TABLET | Freq: Every day | ORAL | Status: DC
  Administered 2012-01-31 – 2012-02-02 (×3): 1 mg via ORAL
  Filled 2012-01-31 (×4): qty 1

## 2012-01-31 MED ORDER — ENALAPRIL MALEATE 5 MG PO TABS
5.0000 mg | ORAL_TABLET | Freq: Two times a day (BID) | ORAL | Status: DC
  Administered 2012-01-31 – 2012-02-02 (×7): 5 mg via ORAL
  Filled 2012-01-31 (×9): qty 1

## 2012-01-31 MED ORDER — BUTALBITAL-APAP-CAFF-COD 50-325-40-30 MG PO CAPS: 1.0000 | ORAL_CAPSULE | Freq: Every evening | ORAL | Status: DC | PRN

## 2012-01-31 MED ORDER — BUTALBITAL-APAP-CAFFEINE 50-325-40 MG PO TABS
1.0000 | ORAL_TABLET | Freq: Four times a day (QID) | ORAL | Status: DC | PRN
  Administered 2012-01-31 – 2012-02-01 (×3): 1 via ORAL
  Filled 2012-01-31 (×3): qty 1

## 2012-01-31 MED ORDER — ATORVASTATIN CALCIUM 80 MG PO TABS
80.0000 mg | ORAL_TABLET | Freq: Every day | ORAL | Status: DC
  Administered 2012-01-31 – 2012-02-02 (×3): 80 mg via ORAL
  Filled 2012-01-31 (×4): qty 1

## 2012-01-31 MED ORDER — IPRATROPIUM BROMIDE 0.02 % IN SOLN
0.5000 mg | Freq: Three times a day (TID) | RESPIRATORY_TRACT | Status: DC
  Administered 2012-01-31 – 2012-02-03 (×10): 0.5 mg via RESPIRATORY_TRACT
  Filled 2012-01-31 (×10): qty 2.5

## 2012-01-31 MED ORDER — BIOTENE DRY MOUTH MT LIQD
15.0000 mL | Freq: Two times a day (BID) | OROMUCOSAL | Status: DC
  Administered 2012-01-31 – 2012-02-02 (×5): 15 mL via OROMUCOSAL

## 2012-01-31 MED ORDER — ALPRAZOLAM 1 MG PO TABS
1.0000 mg | ORAL_TABLET | Freq: Three times a day (TID) | ORAL | Status: DC | PRN
  Administered 2012-01-31 – 2012-02-03 (×6): 1 mg via ORAL
  Filled 2012-01-31 (×6): qty 1

## 2012-01-31 MED ORDER — CHLORHEXIDINE GLUCONATE 0.12 % MT SOLN
15.0000 mL | Freq: Two times a day (BID) | OROMUCOSAL | Status: DC
  Administered 2012-01-31 – 2012-02-02 (×5): 15 mL via OROMUCOSAL
  Filled 2012-01-31 (×9): qty 15

## 2012-01-31 MED ORDER — ONDANSETRON HCL 4 MG/2ML IJ SOLN
4.0000 mg | Freq: Four times a day (QID) | INTRAMUSCULAR | Status: DC | PRN
  Administered 2012-01-31 – 2012-02-01 (×2): 4 mg via INTRAVENOUS
  Filled 2012-01-31 (×2): qty 2

## 2012-01-31 MED ORDER — LEVOFLOXACIN IN D5W 750 MG/150ML IV SOLN: 750.0000 mg | INTRAVENOUS | Status: DC

## 2012-01-31 MED ORDER — ASPIRIN 325 MG PO TABS
325.0000 mg | ORAL_TABLET | Freq: Every day | ORAL | Status: DC
  Administered 2012-01-31 – 2012-02-02 (×3): 325 mg via ORAL
  Filled 2012-01-31 (×5): qty 1

## 2012-01-31 MED ORDER — FUROSEMIDE 40 MG PO TABS
40.0000 mg | ORAL_TABLET | Freq: Two times a day (BID) | ORAL | Status: DC
  Administered 2012-01-31 – 2012-02-02 (×5): 40 mg via ORAL
  Filled 2012-01-31 (×7): qty 1

## 2012-01-31 NOTE — Progress Notes (Signed)
Pt. C/o of a headache this AM MD ordered Fioricet and that relieved the pain in her head. Appetite is good. She has a productive cough. CT scan was done today. Ambulating to the bathroom with assist.

## 2012-01-31 NOTE — Progress Notes (Signed)
Subjective: Pt states that she feels much better. She also states that it is unlikely that she'll stop smoking as she enjoys smoking. She has no complaints except that she still feels weak. Objective: Filed Vitals:   01/31/12 0524 01/31/12 1142 01/31/12 1441 01/31/12 1456  BP: 101/60 102/52  94/59  Pulse: 80   85  Temp: 97.7 F (36.5 C)   97.8 F (36.6 C)  TempSrc: Oral   Oral  Resp: 20   20  Height:      Weight:      SpO2: 99%  100% 100%   Weight change:   Intake/Output Summary (Last 24 hours) at 01/31/12 1757 Last data filed at 01/31/12 1221  Gross per 24 hour  Intake    600 ml  Output    801 ml  Net   -201 ml    General: Alert, awake, oriented x3, in no acute distress.  HEENT: [place]/AT PEERL, EOMI Neck: Trachea midline,  no masses, no thyromegal,y no JVD, no carotid bruit OROPHARYNX:  Moist, No exudate/ erythema/lesions.  Heart: Regular rate and rhythm, without murmurs, rubs, gallops, PMI non-displaced, no heaves or thrills on palpation.  Lungs:mild diffuse wheezing.No rhonchi noted. No increased vocal fremitus resonant to percussion  Abdomen: Soft, nontender, nondistended, positive bowel sounds, no masses no hepatosplenomegaly noted. Pt has a reducible ventral hernia    Lab Results:  Basename 01/31/12 0400 01/30/12 1845  NA 129* 132*  K 4.5 4.7  CL 94* 95*  CO2 27 27  GLUCOSE 83 109*  BUN 19 19  CREATININE 1.05 1.12*  CALCIUM 9.2 9.5  MG -- --  PHOS -- --    Basename 01/31/12 0400 01/30/12 1845  AST 51* 71*  ALT 59* 78*  ALKPHOS 230* 292*  BILITOT 0.3 0.3  PROT 6.5 7.5  ALBUMIN 2.4* 2.8*   No results found for this basename: LIPASE:2,AMYLASE:2 in the last 72 hours  Basename 01/31/12 0400 01/30/12 1845  WBC 8.1 11.4*  NEUTROABS -- 9.5*  HGB 10.7* 11.7*  HCT 33.8* 36.9  MCV 94.7 95.3  PLT 224 259   No results found for this basename: CKTOTAL:3,CKMB:3,CKMBINDEX:3,TROPONINI:3 in the last 72 hours No components found with this basename: POCBNP:3 No  results found for this basename: DDIMER:2 in the last 72 hours No results found for this basename: HGBA1C:2 in the last 72 hours No results found for this basename: CHOL:2,HDL:2,LDLCALC:2,TRIG:2,CHOLHDL:2,LDLDIRECT:2 in the last 72 hours No results found for this basename: TSH,T4TOTAL,FREET3,T3FREE,THYROIDAB in the last 72 hours No results found for this basename: VITAMINB12:2,FOLATE:2,FERRITIN:2,TIBC:2,IRON:2,RETICCTPCT:2 in the last 72 hours  Micro Results: Recent Results (from the past 240 hour(s))  CULTURE, EXPECTORATED SPUTUM-ASSESSMENT     Status: Normal   Collection Time   01/31/12  8:10 AM      Component Value Range Status Comment   Specimen Description SPUTUM   Final    Special Requests NONE   Final    Sputum evaluation     Final    Value: THIS SPECIMEN IS ACCEPTABLE. RESPIRATORY CULTURE REPORT TO FOLLOW.   Report Status 01/31/2012 FINAL   Final     Studies/Results: Ct Abdomen Pelvis Wo Contrast  01/30/2012  *RADIOLOGY REPORT*  Clinical Data: Nausea and epigastric abdominal pain.  CT ABDOMEN AND PELVIS WITHOUT CONTRAST  Technique:  Multidetector CT imaging of the abdomen and pelvis was performed following the standard protocol without intravenous contrast.  Comparison: None.  Findings: Patchy peripheral airspace opacities are noted at the lung bases, with scattered nodularity and diffuse interstitial  prominence.  The most prominent nodule measures 5 mm at the left lung base (image 6 of 24).  The appearance raises concern for an underlying atypical infectious process.  The liver and spleen are unremarkable in appearance.  The gallbladder is within normal limits.  The pancreas and adrenal glands are unremarkable.  The kidneys are unremarkable in appearance.  There is no evidence of hydronephrosis.  No renal or ureteral stones are seen.  No perinephric stranding is appreciated.  No free fluid is identified.  The small bowel is unremarkable in appearance.  The stomach is within normal limits.   No acute vascular abnormalities are seen.  There is diffuse calcification along the abdominal aorta and its branches, with mild ectasia at the distal abdominal aorta but no evidence of aneurysmal dilatation.  There is a small to moderate anterior abdominal wall hernia noted inferiorly, containing a segment of small bowel and demonstrate mild soft tissue inflammation.  There is no evidence for bowel obstruction.  The patient appears status post appendectomy; postoperative change is noted about the ascending colon, with an associated bowel suture line.  The colon is largely filled with stool.  The sigmoid colon is mildly redundant.  The bladder is mildly distended and grossly unremarkable in appearance.  The uterus is grossly unremarkable.  The ovaries are relatively symmetric.  No suspicious adnexal masses are seen.  No inguinal lymphadenopathy is seen.  No acute osseous abnormalities are identified.  Chronic compression deformities are noted at vertebral bodies L1 and L3; there is mild grade 1 anterolisthesis of L4 on L5.  Facet disease is noted along the lumbar spine.  IMPRESSION:  1.  No acute abnormality seen within the abdomen or pelvis. 2.  Patchy peripheral airspace opacities at the lung bases, with scattered nodularity and diffuse interstitial prominence.  The largest nodule measures 5 mm in size.  The appearance raises concern for an underlying atypical infectious process. 3.  Small to moderate anterior abdominal wall hernia noted inferiorly, containing a short segment of small bowel and demonstrating mild soft tissue inflammation.  No evidence for bowel obstruction. 4.  Diffuse calcification along the abdominal aorta and its branches, with mild ectasia at the distal abdominal aorta but no evidence of aneurysmal dilatation. 5.  Chronic compression deformities at L1 and L3, with additional mild degenerative change noted along the lower lumbar spine.  Original Report Authenticated By: Tonia Ghent, M.D.    Dg Chest 2 View  01/30/2012  *RADIOLOGY REPORT*  Clinical Data: Shortness of breath and fatigue.  CHEST - 2 VIEW  Comparison: 11/10/2011.  Findings: Stable cardiac enlargement.  The pulmonary hila are stable.  Stable emphysematous changes and chronic bronchitic changes.  No infiltrates, edema or effusions.  The bony thorax is intact.  IMPRESSION: Chronic emphysematous and bronchitic lung changes without definite acute overlying pulmonary process.  Original Report Authenticated By: P. Loralie Champagne, M.D.   Ct Chest W Contrast  01/31/2012  *RADIOLOGY REPORT*  Clinical Data: Left lung base nodule on CT abdomen pelvis.  Fever, cough, shortness of breath and back pain.  History of breast cancer.  CT CHEST WITH CONTRAST  Technique:  Multidetector CT imaging of the chest was performed following the standard protocol during bolus administration of intravenous contrast.  Contrast: 80mL OMNIPAQUE IOHEXOL 300 MG/ML  SOLN  Comparison: Chest radiograph 05/13 and CT abdomen pelvis 01/30/2012.  Findings: Mediastinal lymph nodes measure up to 1.8 cm in the low right paratracheal station.  Right hilar lymph nodes measure up to  1.4 cm.  There is left infrahilar lymphoid tissue.  Heart is mildly enlarged.  No pericardial effusion. Retractile mild soft tissue in the right breast may be due to previous lumpectomy.  Trace bilateral pleural fluid.  Biapical pleural parenchymal scarring.  Scarring extends along the subpleural aspect of the anterior right upper and right middle lobes, with volume loss and bronchiectasis in the right middle lobe, medially.  There is diffuse ill-defined centrilobular nodularity with mild associated smooth septal thickening.  Dependent atelectasis in both lower lobes. A previously measured nodular density in the subpleural left lower lobe (01/30/2012) is less prominent on the current study (image 46).  Airway is unremarkable.  Incidental imaging of the upper abdomen shows no acute findings. No worrisome  lytic or sclerotic lesions.  There is a compression fracture of L1, as on yesterday's exam.  IMPRESSION:  1.  Diffuse ill-defined peribronchovascular nodularity with some septal thickening, favoring an infectious bronchiolitis or atypical/viral pneumonia. 2.  Mediastinal and right hilar adenopathy may be reactive. Attention on follow-up exams is warranted. 3.  Previously measured left lower lobe subpleural nodule is less prominent on the current examination. 4.  Pleural parenchymal scarring in the right upper and right middle lobes may be due to previous radiation therapy. 5.  Trace bilateral pleural fluid. 6.  L1 compression fracture, age indeterminate.  Original Report Authenticated By: Reyes Ivan, M.D.    Medications: I have reviewed the patient's current medications. Scheduled Meds:   . albuterol  2.5 mg Nebulization TID   And  . ipratropium  0.5 mg Nebulization TID  . anastrozole  1 mg Oral Daily  . antiseptic oral rinse  15 mL Mouth Rinse q12n4p  . aspirin  325 mg Oral Daily  . atorvastatin  80 mg Oral QHS  . chlorhexidine  15 mL Mouth Rinse BID  . enalapril  5 mg Oral BID  . fluticasone  2 puff Inhalation BID  . furosemide  40 mg Oral BID  . levofloxacin (LEVAQUIN) IV  750 mg Intravenous Q48H  . montelukast  10 mg Oral QHS  . nortriptyline  25 mg Oral QHS  . pantoprazole  40 mg Oral Daily  . potassium chloride SA  20 mEq Oral Daily  . DISCONTD: acetaminophen  650 mg Oral Once  . DISCONTD: albuterol  2.5 mg Nebulization Q4H  . DISCONTD: albuterol  2.5 mg Nebulization Q4H  . DISCONTD: ipratropium  0.5 mg Nebulization Q4H  . DISCONTD: ipratropium  0.5 mg Nebulization TID  . DISCONTD: levofloxacin (LEVAQUIN) IV  750 mg Intravenous QHS   Continuous Infusions:   . sodium chloride 50 mL/hr at 01/31/12 0138  . sodium chloride    . DISCONTD: sodium chloride 125 mL/hr at 01/30/12 1740   PRN Meds:.ALPRAZolam, butalbital-acetaminophen-caffeine, iohexol, lactulose, ondansetron  (ZOFRAN) IV, DISCONTD: butalbital-acetaminophen-caffeine, DISCONTD: butalbital-acetaminophen-caffeine, DISCONTD: butalbital-acetaminophen-caffeine Assessment/Plan: Patient Active Hospital Problem List: Pneumonia (01/31/2012)   Assessment: Atypical pneumonia   Plan: Will continue antibiotics.  COPD (chronic obstructive pulmonary disease) (08/01/2011)   Assessment: Pt well compensated    Plan: will prescribe Albuterol PRN  Abnormal LFTs (08/02/2011)   Assessment:improving with hydartion    Plan: Repeat levels tomorrow  Pulmonary hypertension, PA (11/12/2011)   Assessment: Monitor oxygen saturations   Dehydration (01/31/2012)   Assessment: continue hydration    Chronic diastolic CHF (congestive heart failure) (01/31/2012)   Assessment: Monitor fluids closely   Hyponatremia (01/31/2012)   Assessment: Suspect secondary to dehydrated state   Plan: Will continue hydration for now. Check  CT chest to ensure no evidence of malignancy as contributing factor.   LOS: 1 day

## 2012-01-31 NOTE — ED Notes (Signed)
New IV site est by IV team. Admitting MD at bedside.

## 2012-01-31 NOTE — Progress Notes (Signed)
ANTIBIOTIC CONSULT NOTE - INITIAL  Pharmacy Consult for may adjust antibiotics for renal function/Levaquin Indication: CAP  Allergies  Allergen Reactions  . Ceftriaxone Other (See Comments)    "Blow up like a balloon"   Patient Measurements: Height: 5\' 2"  (157.5 cm) Weight: 124 lb 1.9 oz (56.3 kg) IBW/kg (Calculated) : 50.1   Vital Signs: Temp: 97.7 F (36.5 C) (05/06 0524) Temp src: Oral (05/06 0524) BP: 102/52 mmHg (05/06 1142) Pulse Rate: 80  (05/06 0524) Intake/Output from previous day: 05/05 0701 - 05/06 0700 In: -  Out: 1 [Emesis/NG output:1] Intake/Output from this shift: Total I/O In: 360 [P.O.:360] Out: 800 [Urine:800]  Labs:  Basename 01/31/12 0400 01/30/12 1845  WBC 8.1 11.4*  HGB 10.7* 11.7*  PLT 224 259  LABCREA -- --  CREATININE 1.05 1.12*   Estimated Creatinine Clearance: 41.7 ml/min (by C-G formula based on Cr of 1.05). No results found for this basename: VANCOTROUGH:2,VANCOPEAK:2,VANCORANDOM:2,GENTTROUGH:2,GENTPEAK:2,GENTRANDOM:2,TOBRATROUGH:2,TOBRAPEAK:2,TOBRARND:2,AMIKACINPEAK:2,AMIKACINTROU:2,AMIKACIN:2, in the last 72 hours   Microbiology: Recent Results (from the past 720 hour(s))  CULTURE, EXPECTORATED SPUTUM-ASSESSMENT     Status: Normal   Collection Time   01/31/12  8:10 AM      Component Value Range Status Comment   Specimen Description SPUTUM   Final    Special Requests NONE   Final    Sputum evaluation     Final    Value: THIS SPECIMEN IS ACCEPTABLE. RESPIRATORY CULTURE REPORT TO FOLLOW.   Report Status 01/31/2012 FINAL   Final     Medical History: Past Medical History  Diagnosis Date  . Angina   . Heart murmur   . Cancer of breast unknown    RT Breast  . Depressed   . Coronary artery disease 2006    2 stents w/previous MI  . Hypertension   . Migraine   . Anxiety   . Myocardial infarction 2006  . COPD (chronic obstructive pulmonary disease)     oxygen-dependent 4LPM [place]  . Moderate to severe pulmonary hypertension   .  Diastolic CHF     Medications:  Anti-infectives     Start     Dose/Rate Route Frequency Ordered Stop   01/31/12 0115   levofloxacin (LEVAQUIN) IVPB 750 mg        750 mg 100 mL/hr over 90 Minutes Intravenous Daily at bedtime 01/31/12 0101           Assessment:   66 yo female w/hx of Breast Ca, s/p chemo/XRT  Admit with Cough/SOB, treated as outpt with Augmentin.  Renal function not optimal, clearance < 50 ml/min  Goal of Therapy:  Adjust antibiotics for renal function  Plan:  Change Levaquin to 750mg  IV q48hr. Monitor renal function  Otho Bellows PharmD Pager [phone] 01/31/2012,1:12 PM

## 2012-02-01 DIAGNOSIS — J189 Pneumonia, unspecified organism: Secondary | ICD-10-CM

## 2012-02-01 DIAGNOSIS — I5032 Chronic diastolic (congestive) heart failure: Secondary | ICD-10-CM

## 2012-02-01 DIAGNOSIS — J438 Other emphysema: Secondary | ICD-10-CM

## 2012-02-01 LAB — CBC
HCT: 35.4 % — ABNORMAL LOW (ref 36.0–46.0)
Hemoglobin: 11.4 g/dL — ABNORMAL LOW (ref 12.0–15.0)
MCH: 30.4 pg (ref 26.0–34.0)
MCHC: 32.2 g/dL (ref 30.0–36.0)
MCV: 94.4 fL (ref 78.0–100.0)
Platelets: 246 10*3/uL (ref 150–400)
RBC: 3.75 MIL/uL — ABNORMAL LOW (ref 3.87–5.11)
RDW: 18.5 % — ABNORMAL HIGH (ref 11.5–15.5)
WBC: 4.8 10*3/uL (ref 4.0–10.5)

## 2012-02-01 LAB — DIFFERENTIAL
Basophils Absolute: 0 10*3/uL (ref 0.0–0.1)
Basophils Relative: 1 % (ref 0–1)
Eosinophils Absolute: 0 10*3/uL (ref 0.0–0.7)
Eosinophils Relative: 1 % (ref 0–5)
Lymphocytes Relative: 19 % (ref 12–46)
Lymphs Abs: 0.9 10*3/uL (ref 0.7–4.0)
Monocytes Absolute: 0.4 10*3/uL (ref 0.1–1.0)
Monocytes Relative: 9 % (ref 3–12)
Neutro Abs: 3.5 10*3/uL (ref 1.7–7.7)
Neutrophils Relative %: 70 % (ref 43–77)

## 2012-02-01 LAB — COMPREHENSIVE METABOLIC PANEL
ALT: 60 U/L — ABNORMAL HIGH (ref 0–35)
AST: 56 U/L — ABNORMAL HIGH (ref 0–37)
Albumin: 2.4 g/dL — ABNORMAL LOW (ref 3.5–5.2)
Alkaline Phosphatase: 204 U/L — ABNORMAL HIGH (ref 39–117)
BUN: 14 mg/dL (ref 6–23)
CO2: 30 mEq/L (ref 19–32)
Calcium: 9.6 mg/dL (ref 8.4–10.5)
Chloride: 100 mEq/L (ref 96–112)
Creatinine, Ser: 0.93 mg/dL (ref 0.50–1.10)
GFR calc Af Amer: 73 mL/min — ABNORMAL LOW (ref >90)
GFR calc non Af Amer: 63 mL/min — ABNORMAL LOW (ref >90)
Glucose, Bld: 95 mg/dL (ref 70–99)
Potassium: 4.6 mEq/L (ref 3.5–5.1)
Sodium: 137 mEq/L (ref 135–145)
Total Bilirubin: 0.1 mg/dL — ABNORMAL LOW (ref 0.3–1.2)
Total Protein: 6.7 g/dL (ref 6.0–8.3)

## 2012-02-01 MED ORDER — METOCLOPRAMIDE HCL 5 MG/ML IJ SOLN
10.0000 mg | Freq: Once | INTRAMUSCULAR | Status: AC
  Administered 2012-02-01: 10 mg via INTRAVENOUS
  Filled 2012-02-01: qty 2

## 2012-02-01 MED ORDER — LEVOFLOXACIN 500 MG PO TABS
500.0000 mg | ORAL_TABLET | Freq: Every day | ORAL | Status: DC
  Administered 2012-02-01 – 2012-02-02 (×2): 500 mg via ORAL
  Filled 2012-02-01 (×3): qty 1

## 2012-02-01 MED ORDER — BUTALBITAL-APAP-CAFFEINE 50-325-40 MG PO TABS
2.0000 | ORAL_TABLET | Freq: Four times a day (QID) | ORAL | Status: DC | PRN
  Administered 2012-02-01 – 2012-02-02 (×5): 2 via ORAL
  Filled 2012-02-01 (×5): qty 2

## 2012-02-01 MED ORDER — LEVOFLOXACIN 750 MG PO TABS
750.0000 mg | ORAL_TABLET | Freq: Every day | ORAL | Status: DC
  Filled 2012-02-01: qty 1

## 2012-02-01 MED ORDER — KETOROLAC TROMETHAMINE 30 MG/ML IJ SOLN
30.0000 mg | Freq: Once | INTRAMUSCULAR | Status: AC
  Administered 2012-02-01: 30 mg via INTRAVENOUS
  Filled 2012-02-01: qty 1

## 2012-02-01 MED ORDER — DIPHENHYDRAMINE HCL 50 MG/ML IJ SOLN
25.0000 mg | Freq: Once | INTRAMUSCULAR | Status: AC
  Administered 2012-02-01: 25 mg via INTRAVENOUS
  Filled 2012-02-01: qty 1

## 2012-02-01 NOTE — Progress Notes (Signed)
Subjective: Pt states that she feels much better. She feels as though her strength has returned. She states that she uses oxygen only at night.  Interval History: CT chest reviewed and found to be consistent with infectious bronchiolitis and/ atypical or viral infection. Objective: Filed Vitals:   01/31/12 2004 01/31/12 2103 02/01/12 0540 02/01/12 0857  BP:  107/69 107/63   Pulse:  79 86   Temp:  98.8 F (37.1 C) 98.5 F (36.9 C)   TempSrc:  Oral Oral   Resp:  18 18   Height:      Weight:      SpO2: 100% 95% 99% 100%   Weight change:   Intake/Output Summary (Last 24 hours) at 02/01/12 0942 Last data filed at 02/01/12 0900  Gross per 24 hour  Intake 1718.34 ml  Output   1850 ml  Net -131.66 ml    General: Alert, awake, oriented x3, in no acute distress. Pt looks well today. HEENT: [place]/AT PEERL, EOMI Neck: Trachea midline,  no masses, no thyromegal,y no JVD, no carotid bruit OROPHARYNX:  Moist, No exudate/ erythema/lesions.  Heart: Regular rate and rhythm, without murmurs, rubs, gallops, PMI non-displaced, no heaves or thrills on palpation.  Lungs:No wheezing or rhonchi noted. No increased vocal fremitus resonant to percussion  Abdomen: Soft, nontender, nondistended, positive bowel sounds, no masses no hepatosplenomegaly noted. Pt has a reducible ventral hernia    Lab Results:  Basename 02/01/12 0348 01/31/12 0400  NA 137 129*  K 4.6 4.5  CL 100 94*  CO2 30 27  GLUCOSE 95 83  BUN 14 19  CREATININE 0.93 1.05  CALCIUM 9.6 9.2  MG -- --  PHOS -- --    Basename 02/01/12 0348 01/31/12 0400  AST 56* 51*  ALT 60* 59*  ALKPHOS 204* 230*  BILITOT 0.1* 0.3  PROT 6.7 6.5  ALBUMIN 2.4* 2.4*   No results found for this basename: LIPASE:2,AMYLASE:2 in the last 72 hours  Basename 02/01/12 0348 01/31/12 0400 01/30/12 1845  WBC 4.8 8.1 --  NEUTROABS 3.5 -- 9.5*  HGB 11.4* 10.7* --  HCT 35.4* 33.8* --  MCV 94.4 94.7 --  PLT 246 224 --   No results found for this  basename: CKTOTAL:3,CKMB:3,CKMBINDEX:3,TROPONINI:3 in the last 72 hours No components found with this basename: POCBNP:3 No results found for this basename: DDIMER:2 in the last 72 hours No results found for this basename: HGBA1C:2 in the last 72 hours No results found for this basename: CHOL:2,HDL:2,LDLCALC:2,TRIG:2,CHOLHDL:2,LDLDIRECT:2 in the last 72 hours No results found for this basename: TSH,T4TOTAL,FREET3,T3FREE,THYROIDAB in the last 72 hours No results found for this basename: VITAMINB12:2,FOLATE:2,FERRITIN:2,TIBC:2,IRON:2,RETICCTPCT:2 in the last 72 hours  Micro Results: Recent Results (from the past 240 hour(s))  CULTURE, BLOOD (ROUTINE X 2)     Status: Normal (Preliminary result)   Collection Time   01/30/12  6:45 PM      Component Value Range Status Comment   Specimen Description BLOOD LEFT ARM  7 ML IN [facility] BOTTLE   Final    Special Requests NONE   Final    Culture  Setup Time [id number]   Final    Culture     Final    Value:        BLOOD CULTURE RECEIVED NO GROWTH TO DATE CULTURE WILL BE HELD FOR 5 DAYS BEFORE ISSUING A FINAL NEGATIVE REPORT   Report Status PENDING   Incomplete   CULTURE, BLOOD (ROUTINE X 2)     Status: Normal (Preliminary result)  Collection Time   01/30/12  6:50 PM      Component Value Range Status Comment   Specimen Description BLOOD LEFT THUMB  5 ML IN [facility] BOTTLE   Final    Special Requests NONE   Final    Culture  Setup Time [id number]   Final    Culture     Final    Value:        BLOOD CULTURE RECEIVED NO GROWTH TO DATE CULTURE WILL BE HELD FOR 5 DAYS BEFORE ISSUING A FINAL NEGATIVE REPORT   Report Status PENDING   Incomplete   URINE CULTURE     Status: Normal   Collection Time   01/30/12  8:40 PM      Component Value Range Status Comment   Specimen Description URINE, CATHETERIZED   Final    Special Requests NONE   Final    Culture  Setup Time [id number]   Final    Colony Count NO GROWTH   Final    Culture NO GROWTH   Final    Report  Status 01/31/2012 FINAL   Final   CULTURE, EXPECTORATED SPUTUM-ASSESSMENT     Status: Normal   Collection Time   01/31/12  8:10 AM      Component Value Range Status Comment   Specimen Description SPUTUM   Final    Special Requests NONE   Final    Sputum evaluation     Final    Value: THIS SPECIMEN IS ACCEPTABLE. RESPIRATORY CULTURE REPORT TO FOLLOW.   Report Status 01/31/2012 FINAL   Final   CULTURE, RESPIRATORY     Status: Normal (Preliminary result)   Collection Time   01/31/12  8:10 AM      Component Value Range Status Comment   Specimen Description SPUTUM   Final    Special Requests NONE   Final    Gram Stain     Final    Value: FEW WBC PRESENT, PREDOMINANTLY PMN     NO SQUAMOUS EPITHELIAL CELLS SEEN     NO ORGANISMS SEEN   Culture PENDING   Incomplete    Report Status PENDING   Incomplete     Studies/Results: Ct Abdomen Pelvis Wo Contrast  01/30/2012  *RADIOLOGY REPORT*  Clinical Data: Nausea and epigastric abdominal pain.  CT ABDOMEN AND PELVIS WITHOUT CONTRAST  Technique:  Multidetector CT imaging of the abdomen and pelvis was performed following the standard protocol without intravenous contrast.  Comparison: None.  Findings: Patchy peripheral airspace opacities are noted at the lung bases, with scattered nodularity and diffuse interstitial prominence.  The most prominent nodule measures 5 mm at the left lung base (image 6 of 24).  The appearance raises concern for an underlying atypical infectious process.  The liver and spleen are unremarkable in appearance.  The gallbladder is within normal limits.  The pancreas and adrenal glands are unremarkable.  The kidneys are unremarkable in appearance.  There is no evidence of hydronephrosis.  No renal or ureteral stones are seen.  No perinephric stranding is appreciated.  No free fluid is identified.  The small bowel is unremarkable in appearance.  The stomach is within normal limits.  No acute vascular abnormalities are seen.  There is  diffuse calcification along the abdominal aorta and its branches, with mild ectasia at the distal abdominal aorta but no evidence of aneurysmal dilatation.  There is a small to moderate anterior abdominal wall hernia noted inferiorly, containing a segment of small bowel and demonstrate mild  soft tissue inflammation.  There is no evidence for bowel obstruction.  The patient appears status post appendectomy; postoperative change is noted about the ascending colon, with an associated bowel suture line.  The colon is largely filled with stool.  The sigmoid colon is mildly redundant.  The bladder is mildly distended and grossly unremarkable in appearance.  The uterus is grossly unremarkable.  The ovaries are relatively symmetric.  No suspicious adnexal masses are seen.  No inguinal lymphadenopathy is seen.  No acute osseous abnormalities are identified.  Chronic compression deformities are noted at vertebral bodies L1 and L3; there is mild grade 1 anterolisthesis of L4 on L5.  Facet disease is noted along the lumbar spine.  IMPRESSION:  1.  No acute abnormality seen within the abdomen or pelvis. 2.  Patchy peripheral airspace opacities at the lung bases, with scattered nodularity and diffuse interstitial prominence.  The largest nodule measures 5 mm in size.  The appearance raises concern for an underlying atypical infectious process. 3.  Small to moderate anterior abdominal wall hernia noted inferiorly, containing a short segment of small bowel and demonstrating mild soft tissue inflammation.  No evidence for bowel obstruction. 4.  Diffuse calcification along the abdominal aorta and its branches, with mild ectasia at the distal abdominal aorta but no evidence of aneurysmal dilatation. 5.  Chronic compression deformities at L1 and L3, with additional mild degenerative change noted along the lower lumbar spine.  Original Report Authenticated By: Tonia Ghent, M.D.   Dg Chest 2 View  01/30/2012  *RADIOLOGY REPORT*   Clinical Data: Shortness of breath and fatigue.  CHEST - 2 VIEW  Comparison: 11/10/2011.  Findings: Stable cardiac enlargement.  The pulmonary hila are stable.  Stable emphysematous changes and chronic bronchitic changes.  No infiltrates, edema or effusions.  The bony thorax is intact.  IMPRESSION: Chronic emphysematous and bronchitic lung changes without definite acute overlying pulmonary process.  Original Report Authenticated By: P. Loralie Champagne, M.D.   Ct Chest W Contrast  01/31/2012  *RADIOLOGY REPORT*  Clinical Data: Left lung base nodule on CT abdomen pelvis.  Fever, cough, shortness of breath and back pain.  History of breast cancer.  CT CHEST WITH CONTRAST  Technique:  Multidetector CT imaging of the chest was performed following the standard protocol during bolus administration of intravenous contrast.  Contrast: 80mL OMNIPAQUE IOHEXOL 300 MG/ML  SOLN  Comparison: Chest radiograph 05/13 and CT abdomen pelvis 01/30/2012.  Findings: Mediastinal lymph nodes measure up to 1.8 cm in the low right paratracheal station.  Right hilar lymph nodes measure up to 1.4 cm.  There is left infrahilar lymphoid tissue.  Heart is mildly enlarged.  No pericardial effusion. Retractile mild soft tissue in the right breast may be due to previous lumpectomy.  Trace bilateral pleural fluid.  Biapical pleural parenchymal scarring.  Scarring extends along the subpleural aspect of the anterior right upper and right middle lobes, with volume loss and bronchiectasis in the right middle lobe, medially.  There is diffuse ill-defined centrilobular nodularity with mild associated smooth septal thickening.  Dependent atelectasis in both lower lobes. A previously measured nodular density in the subpleural left lower lobe (01/30/2012) is less prominent on the current study (image 46).  Airway is unremarkable.  Incidental imaging of the upper abdomen shows no acute findings. No worrisome lytic or sclerotic lesions.  There is a compression  fracture of L1, as on yesterday's exam.  IMPRESSION:  1.  Diffuse ill-defined peribronchovascular nodularity with some septal thickening, favoring  an infectious bronchiolitis or atypical/viral pneumonia. 2.  Mediastinal and right hilar adenopathy may be reactive. Attention on follow-up exams is warranted. 3.  Previously measured left lower lobe subpleural nodule is less prominent on the current examination. 4.  Pleural parenchymal scarring in the right upper and right middle lobes may be due to previous radiation therapy. 5.  Trace bilateral pleural fluid. 6.  L1 compression fracture, age indeterminate.  Original Report Authenticated By: Reyes Ivan, M.D.    Medications: I have reviewed the patient's current medications. Scheduled Meds:    . albuterol  2.5 mg Nebulization TID   And  . ipratropium  0.5 mg Nebulization TID  . anastrozole  1 mg Oral Daily  . antiseptic oral rinse  15 mL Mouth Rinse q12n4p  . aspirin  325 mg Oral Daily  . atorvastatin  80 mg Oral QHS  . chlorhexidine  15 mL Mouth Rinse BID  . diphenhydrAMINE  25 mg Intravenous Once  . enalapril  5 mg Oral BID  . fluticasone  2 puff Inhalation BID  . furosemide  40 mg Oral BID  . ketorolac  30 mg Intravenous Once  . levofloxacin  750 mg Oral Daily  . metoCLOPramide (REGLAN) injection  10 mg Intravenous Once  . montelukast  10 mg Oral QHS  . nortriptyline  25 mg Oral QHS  . pantoprazole  40 mg Oral Daily  . potassium chloride SA  20 mEq Oral Daily  . DISCONTD: levofloxacin (LEVAQUIN) IV  750 mg Intravenous QHS  . DISCONTD: levofloxacin (LEVAQUIN) IV  750 mg Intravenous Q48H   Continuous Infusions:    . DISCONTD: sodium chloride 100 mL/hr at 02/01/12 0631   PRN Meds:.albuterol, ALPRAZolam, butalbital-acetaminophen-caffeine, iohexol, lactulose, ondansetron (ZOFRAN) IV, DISCONTD: butalbital-acetaminophen-caffeine, DISCONTD: butalbital-acetaminophen-caffeine, DISCONTD:  butalbital-acetaminophen-caffeine Assessment/Plan: Patient Active Hospital Problem List: Pneumonia (01/31/2012)   Assessment: Clinically imporoved   Plan: Will change antibiotics to oral route for a total of 8 days.  COPD (chronic obstructive pulmonary disease) (08/01/2011)   Assessment: Pt well compensated    Plan: Continue Albuteroll PRN and add atrovent.  Abnormal LFTs (08/02/2011)   Assessment:improving but still mildly elevated.   Plan: Will check acute hepatitis panel  Pulmonary hypertension, [place] (11/12/2011)   Assessment: Monitor oxygen saturations and continue oxygen at night.   Dehydration (01/31/2012)   Assessment: resolved. Will saline lock IVF    Chronic diastolic CHF (congestive heart failure) (01/31/2012)   Assessment: Clinically compensated.    Hyponatremia (01/31/2012)   Assessment: resolved with hydration.    LOS: 2 days

## 2012-02-01 NOTE — Progress Notes (Signed)
Patient ambulated a full loop of the hallway (approx. 200 feet?) and was able to maintain her O2 Saturation between 99% and 96% on room air.  Will attempt to wean O2 per MD orders, as patient only uses O2 at home at night.

## 2012-02-01 NOTE — Progress Notes (Signed)
Pharmacy: Brief antibiotic note:  Levaquin 750 mg po daily ordered to complete 8 days therapy. Clearance less than 47ml/min, have adjusted dose to 500mg  daily for 7 more doses. Will follow renal function.  Thank you, Otho Bellows 2:37 PM

## 2012-02-02 DIAGNOSIS — J9610 Chronic respiratory failure, unspecified whether with hypoxia or hypercapnia: Secondary | ICD-10-CM

## 2012-02-02 DIAGNOSIS — E860 Dehydration: Secondary | ICD-10-CM

## 2012-02-02 DIAGNOSIS — J438 Other emphysema: Secondary | ICD-10-CM

## 2012-02-02 DIAGNOSIS — J189 Pneumonia, unspecified organism: Secondary | ICD-10-CM

## 2012-02-02 DIAGNOSIS — S32010A Wedge compression fracture of first lumbar vertebra, initial encounter for closed fracture: Secondary | ICD-10-CM

## 2012-02-02 DIAGNOSIS — I5032 Chronic diastolic (congestive) heart failure: Secondary | ICD-10-CM

## 2012-02-02 HISTORY — DX: Chronic respiratory failure, unspecified whether with hypoxia or hypercapnia: J96.10

## 2012-02-02 HISTORY — DX: Wedge compression fracture of first lumbar vertebra, initial encounter for closed fracture: S32.010A

## 2012-02-02 LAB — CULTURE, RESPIRATORY W GRAM STAIN: Culture: NORMAL

## 2012-02-02 LAB — HEPATITIS PANEL, ACUTE
HCV Ab: NEGATIVE
Hep A IgM: NEGATIVE
Hep B C IgM: NEGATIVE
Hepatitis B Surface Ag: NEGATIVE

## 2012-02-02 MED ORDER — METHYLPREDNISOLONE SODIUM SUCC 125 MG IJ SOLR
80.0000 mg | Freq: Two times a day (BID) | INTRAMUSCULAR | Status: DC
  Filled 2012-02-02 (×2): qty 1.28

## 2012-02-02 MED ORDER — GUAIFENESIN-DM 100-10 MG/5ML PO SYRP
5.0000 mL | ORAL_SOLUTION | ORAL | Status: DC | PRN
  Administered 2012-02-02 (×3): 5 mL via ORAL
  Filled 2012-02-02 (×3): qty 10

## 2012-02-02 MED ORDER — PREDNISONE 10 MG PO TABS
60.0000 mg | ORAL_TABLET | Freq: Every day | ORAL | Status: DC
  Filled 2012-02-02: qty 1

## 2012-02-02 MED ORDER — PREDNISONE 50 MG PO TABS
60.0000 mg | ORAL_TABLET | Freq: Every day | ORAL | Status: DC
  Administered 2012-02-02 – 2012-02-03 (×2): 60 mg via ORAL
  Filled 2012-02-02 (×3): qty 1

## 2012-02-02 NOTE — Progress Notes (Signed)
Spoke with patient at bedside on 02/01/12 regarding her history of frequent admissions and [facility] CM services.  Collateral material provided.  Patient has declined services and a transition of care call.  For any additional questions or new referrals please contact Anibal Henderson BSN RN [facility] Liaison at [phone].

## 2012-02-02 NOTE — Progress Notes (Signed)
PROGRESS NOTE  [name] ZOX:[id number] DOB: 1945/10/27 DOA: 01/30/2012 PCP: Elby Showers, MD, MD  Brief narrative: [name] is a 66 year old female with a past medical history of breast cancer, COPD, diastolic heart failure, and pulmonary hypertension who presented to the hospital on 01/31/2012 the chief complaint of cough and shortness of breath. She's been treated for pneumonia.  Assessment/Plan: Principal Problem:  *Pneumonia in a patient with COPD (chronic obstructive pulmonary disease), Pulmonary HTN, and chronic respiratory failure  CT scan done on 01/31/2012 consistent with infectious bronchiolitis and/ atypical or viral infection.  No evidence of sepsis on initial workup.  Blood cultures negative to date.  Placed on empiric Levaquin, bronchodilator therapy.  Add antitussives for cough and Solu-Medrol for bronchospasm.  Home oxygen dependent at 4 L via nasal cannula. Active Problems:  Tobacco abuse  Tobacco cessation advised.  Abnormal LFTs  Chronically elevated, unknown etiology.  Acute hepatitis panel sent.  Dehydration  Placed on gentle IV fluids on admission.  IV fluids discontinued on 02/01/2012.  Chronic diastolic CHF (congestive heart failure)  Two-dimensional echocardiogram done on 11/12/2011. EF 55-60% with grade 1 diastolic dysfunction.  Pro BNP chronically elevated.  Continue Lasix.  Clinically compensated.  Hyponatremia  Mild. Likely related to lung process or dehydration.  Corrected with IVF.  L1 compression fracture  Found incidentally on imaging studies. Chronicity undetermined.  Dyslipidemia  Continue Lipitor.  Code Status: Full code. Family Communication: None at bedside. Disposition Plan: Home, likely 02/03/12.  Medical Consultants:  None  Other consultants:  None  Antibiotics:  Levaquin 01/31/2012--->  Subjective  [name] is having some issues with cough. Her breathing is some better but she continues to wheeze.  Complained of headache.   Objective    Interim History: Stable overnight.   Objective: Filed Vitals:   02/01/12 2020 02/01/12 2025 02/02/12 0601 02/02/12 0753  BP: 127/75  106/68   Pulse: 80  79   Temp: 98.5 F (36.9 C)  97.8 F (36.6 C)   TempSrc: Oral  Oral   Resp: 18  18   Height:      Weight:      SpO2: 99% 97% 99% 97%    Intake/Output Summary (Last 24 hours) at 02/02/12 1017 Last data filed at 02/01/12 1200  Gross per 24 hour  Intake    480 ml  Output      0 ml  Net    480 ml    Exam: Gen:  NAD Cardiovascular:  RRR, No M/R/G Respiratory: Lungs coarse with expiratory wheezes bilaterally. Gastrointestinal: Abdomen soft, NT/ND with normal active bowel sounds. Extremities: No C/E/C    Data Reviewed: Basic Metabolic Panel:  Lab 02/01/12 8119 01/31/12 0400 01/30/12 1845  NA 137 129* 132*  K 4.6 4.5 --  CL 100 94* 95*  CO2 30 27 27   GLUCOSE 95 83 109*  BUN 14 19 19   CREATININE 0.93 1.05 1.12*  CALCIUM 9.6 9.2 9.5  MG -- -- --  PHOS -- -- --   GFR Estimated Creatinine Clearance: 47.1 ml/min (by C-G formula based on Cr of 0.93). Liver Function Tests:  Lab 02/01/12 0348 01/31/12 0400 01/30/12 1845  AST 56* 51* 71*  ALT 60* 59* 78*  ALKPHOS 204* 230* 292*  BILITOT 0.1* 0.3 0.3  PROT 6.7 6.5 7.5  ALBUMIN 2.4* 2.4* 2.8*    CBC:  Lab 02/01/12 0348 01/31/12 0400 01/30/12 1845  WBC 4.8 8.1 11.4*  NEUTROABS 3.5 -- 9.5*  HGB 11.4* 10.7* 11.7*  HCT  35.4* 33.8* 36.9  MCV 94.4 94.7 95.3  PLT 246 224 259   Microbiology Recent Results (from the past 240 hour(s))  CULTURE, BLOOD (ROUTINE X 2)     Status: Normal (Preliminary result)   Collection Time   01/30/12  6:45 PM      Component Value Range Status Comment   Specimen Description BLOOD LEFT ARM  7 ML IN [facility] BOTTLE   Final    Special Requests NONE   Final    Culture  Setup Time [id number]   Final    Culture     Final    Value:        BLOOD CULTURE RECEIVED NO GROWTH TO DATE CULTURE WILL BE HELD  FOR 5 DAYS BEFORE ISSUING A FINAL NEGATIVE REPORT   Report Status PENDING   Incomplete   CULTURE, BLOOD (ROUTINE X 2)     Status: Normal (Preliminary result)   Collection Time   01/30/12  6:50 PM      Component Value Range Status Comment   Specimen Description BLOOD LEFT THUMB  5 ML IN [facility] BOTTLE   Final    Special Requests NONE   Final    Culture  Setup Time [id number]   Final    Culture     Final    Value:        BLOOD CULTURE RECEIVED NO GROWTH TO DATE CULTURE WILL BE HELD FOR 5 DAYS BEFORE ISSUING A FINAL NEGATIVE REPORT   Report Status PENDING   Incomplete   URINE CULTURE     Status: Normal   Collection Time   01/30/12  8:40 PM      Component Value Range Status Comment   Specimen Description URINE, CATHETERIZED   Final    Special Requests NONE   Final    Culture  Setup Time [id number]   Final    Colony Count NO GROWTH   Final    Culture NO GROWTH   Final    Report Status 01/31/2012 FINAL   Final   CULTURE, EXPECTORATED SPUTUM-ASSESSMENT     Status: Normal   Collection Time   01/31/12  8:10 AM      Component Value Range Status Comment   Specimen Description SPUTUM   Final    Special Requests NONE   Final    Sputum evaluation     Final    Value: THIS SPECIMEN IS ACCEPTABLE. RESPIRATORY CULTURE REPORT TO FOLLOW.   Report Status 01/31/2012 FINAL   Final   CULTURE, RESPIRATORY     Status: Normal   Collection Time   01/31/12  8:10 AM      Component Value Range Status Comment   Specimen Description SPUTUM   Final    Special Requests NONE   Final    Gram Stain     Final    Value: FEW WBC PRESENT, PREDOMINANTLY PMN     NO SQUAMOUS EPITHELIAL CELLS SEEN     NO ORGANISMS SEEN   Culture NORMAL OROPHARYNGEAL FLORA   Final    Report Status 02/02/2012 FINAL   Final     Procedures and Diagnostic Studies:  Ct Abdomen Pelvis Wo Contrast 01/30/2012 IMPRESSION:  1.  No acute abnormality seen within the abdomen or pelvis. 2.  Patchy peripheral airspace opacities at the lung bases, with  scattered nodularity and diffuse interstitial prominence.  The largest nodule measures 5 mm in size.  The appearance raises concern for an underlying atypical infectious process. 3.  Small to  moderate anterior abdominal wall hernia noted inferiorly, containing a short segment of small bowel and demonstrating mild soft tissue inflammation.  No evidence for bowel obstruction. 4.  Diffuse calcification along the abdominal aorta and its branches, with mild ectasia at the distal abdominal aorta but no evidence of aneurysmal dilatation. 5.  Chronic compression deformities at L1 and L3, with additional mild degenerative change noted along the lower lumbar spine.  Original Report Authenticated By: Tonia Ghent, M.D.    Dg Chest 2 View 01/30/2012  IMPRESSION: Chronic emphysematous and bronchitic lung changes without definite acute overlying pulmonary process.  Original Report Authenticated By: P. Loralie Champagne, M.D.    Ct Chest W Contrast 01/31/2012 IMPRESSION:  1.  Diffuse ill-defined peribronchovascular nodularity with some septal thickening, favoring an infectious bronchiolitis or atypical/viral pneumonia. 2.  Mediastinal and right hilar adenopathy may be reactive. Attention on follow-up exams is warranted. 3.  Previously measured left lower lobe subpleural nodule is less prominent on the current examination. 4.  Pleural parenchymal scarring in the right upper and right middle lobes may be due to previous radiation therapy. 5.  Trace bilateral pleural fluid. 6.  L1 compression fracture, age indeterminate.  Original Report Authenticated By: Reyes Ivan, M.D.    Scheduled Meds:    . albuterol  2.5 mg Nebulization TID   And  . ipratropium  0.5 mg Nebulization TID  . anastrozole  1 mg Oral Daily  . antiseptic oral rinse  15 mL Mouth Rinse q12n4p  . aspirin  325 mg Oral Daily  . atorvastatin  80 mg Oral QHS  . chlorhexidine  15 mL Mouth Rinse BID  . enalapril  5 mg Oral BID  . fluticasone  2 puff  Inhalation BID  . furosemide  40 mg Oral BID  . levofloxacin  500 mg Oral QHS  . montelukast  10 mg Oral QHS  . nortriptyline  25 mg Oral QHS  . pantoprazole  40 mg Oral Daily  . potassium chloride SA  20 mEq Oral Daily  . DISCONTD: levofloxacin  750 mg Oral QHS   Continuous Infusions:     LOS: 3 days   Hillery Aldo, MD Pager [phone]  02/02/2012, 10:17 AM   Patient Teaching (information given to and reviewed with the patient): [name] 02/02/2012  Treatment team:   Dr. Hillery Aldo, Hospitalist (Internist)    Medical Issues and plan:  Anticipated discharge date:

## 2012-02-03 DIAGNOSIS — E860 Dehydration: Secondary | ICD-10-CM

## 2012-02-03 DIAGNOSIS — I5032 Chronic diastolic (congestive) heart failure: Secondary | ICD-10-CM

## 2012-02-03 DIAGNOSIS — J189 Pneumonia, unspecified organism: Secondary | ICD-10-CM

## 2012-02-03 DIAGNOSIS — J438 Other emphysema: Secondary | ICD-10-CM

## 2012-02-03 MED ORDER — PREDNISONE (PAK) 10 MG PO TABS: ORAL_TABLET | ORAL | Status: AC

## 2012-02-03 MED ORDER — LEVOFLOXACIN 500 MG PO TABS: 500.0000 mg | ORAL_TABLET | Freq: Every day | ORAL | Status: AC

## 2012-02-03 NOTE — Discharge Summary (Signed)
Physician Discharge Summary  Patient ID: [name] MRN: [id number] DOB/AGE: [birth year] 66 y.o.  Admit date: 01/30/2012 Discharge date: 02/03/2012  Primary Care Physician:  Elby Showers, MD, MD   Discharge Diagnoses:    Present on Admission:  .Pneumonia .Dehydration .COPD (chronic obstructive pulmonary disease) .Abnormal LFTs .Pulmonary hypertension, [place] .Chronic diastolic CHF (congestive heart failure) .Hyponatremia .Chronic respiratory failure .Tobacco abuse .Compression fracture of L1 lumbar vertebra .Hyperlipidemia  Discharge Medications:  Medication List  As of 02/03/2012  7:16 AM   TAKE these medications         albuterol 108 (90 BASE) MCG/ACT inhaler   Commonly known as: PROVENTIL HFA;VENTOLIN HFA   Inhale 2 puffs into the lungs every 4 (four) hours as needed. Shortness of breath and wheezing  Patient is using Pro-Air brand      ALPRAZolam 1 MG tablet   Commonly known as: XANAX   Take 1 mg by mouth 3 (three) times daily as needed. For anxitey      anastrozole 1 MG tablet   Commonly known as: ARIMIDEX   Take 1 mg by mouth daily.      aspirin 325 MG tablet   Take 325 mg by mouth daily.      atorvastatin 80 MG tablet   Commonly known as: LIPITOR   Take 80 mg by mouth at bedtime.      butalbital-acetaminophen-caffeine 50-325-40 MG per tablet   Commonly known as: FIORICET, ESGIC   Take 1 tablet by mouth every 4 (four) hours as needed. For headaches      butalbital-acetaminophen-caffeine 50-325-40-30 MG per capsule   Commonly known as: FIORICET WITH CODEINE   Take 1 capsule by mouth at bedtime as needed.      calcium-vitamin D 250-125 MG-UNIT per tablet   Commonly known as: OSCAL WITH D   Take 1 tablet by mouth daily.      diclofenac sodium 1 % Gel   Commonly known as: VOLTAREN   Apply 1 application topically daily as needed. Hand pain      DUONEB 0.5-2.5 (3) MG/3ML Soln   Generic drug: ipratropium-albuterol   Take 3 mLs by nebulization every  4 (four) hours as needed.      enalapril 5 MG tablet   Commonly known as: VASOTEC   Take 5 mg by mouth 2 (two) times daily.      fluticasone 220 MCG/ACT inhaler   Commonly known as: FLOVENT HFA   Inhale 2 puffs into the lungs 2 (two) times daily. For shortness of breath      furosemide 40 MG tablet   Commonly known as: LASIX   Take 1 tablet (40 mg total) by mouth 2 (two) times daily.      ipratropium 17 MCG/ACT inhaler   Commonly known as: ATROVENT HFA   Inhale 2 puffs into the lungs every 4 (four) hours as needed. For shortness of breath      lactulose 10 GM/15ML solution   Commonly known as: CHRONULAC   Take 30 g by mouth as needed.      levofloxacin 500 MG tablet   Commonly known as: LEVAQUIN   Take 1 tablet (500 mg total) by mouth at bedtime.      montelukast 10 MG tablet   Commonly known as: SINGULAIR   Take 10 mg by mouth at bedtime.      multivitamin with minerals tablet   Take 1 tablet by mouth daily.      nortriptyline 25 MG capsule   Commonly  known as: PAMELOR   Take 25 mg by mouth at bedtime.      omega-3 acid ethyl esters 1 G capsule   Commonly known as: LOVAZA   Take 1 g by mouth daily.      ondansetron 8 MG tablet   Commonly known as: ZOFRAN   Take 1 tablet (8 mg total) by mouth every 12 (twelve) hours as needed.      pantoprazole 40 MG tablet   Commonly known as: PROTONIX   Take 40 mg by mouth daily.      potassium chloride SA 20 MEQ tablet   Commonly known as: K-DUR,KLOR-CON   Take 20 mEq by mouth daily.      predniSONE 10 MG tablet   Commonly known as: STERAPRED UNI-PAK   Take as directed.             Disposition and Follow-up: The patient is being discharged home.  She is instructed to follow up with her PCP in 1-2 weeks.   Procedures and Diagnostic Studies:    Ct Abdomen Pelvis Wo Contrast 01/30/2012 IMPRESSION:  1.  No acute abnormality seen within the abdomen or pelvis. 2.  Patchy peripheral airspace opacities at the lung bases,  with scattered nodularity and diffuse interstitial prominence.  The largest nodule measures 5 mm in size.  The appearance raises concern for an underlying atypical infectious process. 3.  Small to moderate anterior abdominal wall hernia noted inferiorly, containing a short segment of small bowel and demonstrating mild soft tissue inflammation.  No evidence for bowel obstruction. 4.  Diffuse calcification along the abdominal aorta and its branches, with mild ectasia at the distal abdominal aorta but no evidence of aneurysmal dilatation. 5.  Chronic compression deformities at L1 and L3, with additional mild degenerative change noted along the lower lumbar spine.  Original Report Authenticated By: Tonia Ghent, M.D.    Dg Chest 2 View 01/30/2012  IMPRESSION: Chronic emphysematous and bronchitic lung changes without definite acute overlying pulmonary process.  Original Report Authenticated By: P. Loralie Champagne, M.D.    Ct Chest W Contrast 01/31/2012 IMPRESSION:  1.  Diffuse ill-defined peribronchovascular nodularity with some septal thickening, favoring an infectious bronchiolitis or atypical/viral pneumonia. 2.  Mediastinal and right hilar adenopathy may be reactive. Attention on follow-up exams is warranted. 3.  Previously measured left lower lobe subpleural nodule is less prominent on the current examination. 4.  Pleural parenchymal scarring in the right upper and right middle lobes may be due to previous radiation therapy. 5.  Trace bilateral pleural fluid. 6.  L1 compression fracture, age indeterminate.  Original Report Authenticated By: Reyes Ivan, M.D.    Discharge Laboratory Values: Basic Metabolic Panel:  Lab 02/01/12 1610 01/31/12 0400 01/30/12 1845  NA 137 129* 132*  K 4.6 4.5 --  CL 100 94* 95*  CO2 30 27 27   GLUCOSE 95 83 109*  BUN 14 19 19   CREATININE 0.93 1.05 1.12*  CALCIUM 9.6 9.2 9.5  MG -- -- --  PHOS -- -- --   GFR Estimated Creatinine Clearance: 47.1 ml/min (by C-G  formula based on Cr of 0.93). Liver Function Tests:  Lab 02/01/12 0348 01/31/12 0400 01/30/12 1845  AST 56* 51* 71*  ALT 60* 59* 78*  ALKPHOS 204* 230* 292*  BILITOT 0.1* 0.3 0.3  PROT 6.7 6.5 7.5  ALBUMIN 2.4* 2.4* 2.8*    CBC:  Lab 02/01/12 0348 01/31/12 0400 01/30/12 1845  WBC 4.8 8.1 11.4*  NEUTROABS 3.5 -- 9.5*  HGB 11.4* 10.7* 11.7*  HCT 35.4* 33.8* 36.9  MCV 94.4 94.7 95.3  PLT 246 224 259   Hepatitis Panel:  Ref. Range 02/01/2012 11:19  Hep A IgM Latest Range: NEGATIVE  NEGATIVE  Hepatitis B Surface Ag Latest Range: NEGATIVE  NEGATIVE  Hep B C IgM Latest Range: NEGATIVE  NEGATIVE  HCV Ab Latest Range: NEGATIVE  NEGATIVE   Microbiology Recent Results (from the past 240 hour(s))  CULTURE, BLOOD (ROUTINE X 2)     Status: Normal (Preliminary result)   Collection Time   01/30/12  6:45 PM      Component Value Range Status Comment   Specimen Description BLOOD LEFT ARM  7 ML IN [facility] BOTTLE   Final    Special Requests NONE   Final    Culture  Setup Time [id number]   Final    Culture     Final    Value:        BLOOD CULTURE RECEIVED NO GROWTH TO DATE CULTURE WILL BE HELD FOR 5 DAYS BEFORE ISSUING A FINAL NEGATIVE REPORT   Report Status PENDING   Incomplete   CULTURE, BLOOD (ROUTINE X 2)     Status: Normal (Preliminary result)   Collection Time   01/30/12  6:50 PM      Component Value Range Status Comment   Specimen Description BLOOD LEFT THUMB  5 ML IN [facility] BOTTLE   Final    Special Requests NONE   Final    Culture  Setup Time [id number]   Final    Culture     Final    Value:        BLOOD CULTURE RECEIVED NO GROWTH TO DATE CULTURE WILL BE HELD FOR 5 DAYS BEFORE ISSUING A FINAL NEGATIVE REPORT   Report Status PENDING   Incomplete   URINE CULTURE     Status: Normal   Collection Time   01/30/12  8:40 PM      Component Value Range Status Comment   Specimen Description URINE, CATHETERIZED   Final    Special Requests NONE   Final    Culture  Setup Time [id number]    Final    Colony Count NO GROWTH   Final    Culture NO GROWTH   Final    Report Status 01/31/2012 FINAL   Final   CULTURE, EXPECTORATED SPUTUM-ASSESSMENT     Status: Normal   Collection Time   01/31/12  8:10 AM      Component Value Range Status Comment   Specimen Description SPUTUM   Final    Special Requests NONE   Final    Sputum evaluation     Final    Value: THIS SPECIMEN IS ACCEPTABLE. RESPIRATORY CULTURE REPORT TO FOLLOW.   Report Status 01/31/2012 FINAL   Final   CULTURE, RESPIRATORY     Status: Normal   Collection Time   01/31/12  8:10 AM      Component Value Range Status Comment   Specimen Description SPUTUM   Final    Special Requests NONE   Final    Gram Stain     Final    Value: FEW WBC PRESENT, PREDOMINANTLY PMN     NO SQUAMOUS EPITHELIAL CELLS SEEN     NO ORGANISMS SEEN   Culture NORMAL OROPHARYNGEAL FLORA   Final    Report Status 02/02/2012 FINAL   Final      Brief H and P: For complete details please refer to admission H and  P, but in brief, Mrs. Kreamer is a 66 year old female with a past medical history of breast cancer, COPD, diastolic heart failure, and pulmonary hypertension who presented to the hospital on 01/31/2012 the chief complaint of cough and shortness of breath.    Physical Exam at Discharge: BP 113/74  Pulse 87  Temp(Src) 97.5 F (36.4 C) (Oral)  Resp 16  Ht 5\' 2"  (1.575 m)  Wt 56.3 kg (124 lb 1.9 oz)  BMI 22.70 kg/m2  SpO2 94% Gen:  NAD Cardiovascular:  RRR, No M/R/G Respiratory: Lungs course with a few wheezes Gastrointestinal: Abdomen soft, NT/ND with normal active bowel sounds. Extremities: No C/E/C   Hospital Course:  Principal Problem:  *Pneumonia in a patient with COPD (chronic obstructive pulmonary disease), Pulmonary HTN, and chronic respiratory failure  CT scan done on 01/31/2012 consistent with infectious bronchiolitis and/ atypical or viral infection. No evidence of sepsis on initial workup.  Blood cultures negative to date.    Placed on empiric Levaquin, bronchodilator therapy.  Antitussives for cough and Solu-Medrol for bronchospasm added on 02/02/12.  Home oxygen dependent at 4 L via nasal cannula. Will d/c home on prednisone dose pack and an additional 7 days of treatment with Levaquin. Active Problems:  Tobacco abuse  Tobacco cessation advised. Abnormal LFTs  Chronically elevated, unknown etiology.  Acute hepatitis panel negative. F/U with PCP for further evaluation. ? Related to statin therapy. Dehydration  Placed on gentle IV fluids on admission.  IV fluids discontinued on 02/01/2012. Chronic diastolic CHF (congestive heart failure)  Two-dimensional echocardiogram done on 11/12/2011. EF 55-60% with grade 1 diastolic dysfunction.  Pro BNP chronically elevated.  Continue Lasix.  Clinically compensated. Hyponatremia  Mild. Likely related to lung process or dehydration.  Corrected with IVF. L1 compression fracture  Found incidentally on imaging studies. Chronicity undetermined. Dyslipidemia  Continue Lipitor.  Recommendations for hospital follow-up:  1.  Consider further evaluation of elevated LFTs (trial off statin).  Diet:  Low sodium, heart healthy.  Activity:  Increase activity slowly.  Condition at Discharge:   Improved.  Time spent on Discharge:  35 minutes.  Signed: Dr. Trula Ore [place] Pager [phone] 02/03/2012, 7:16 AM

## 2012-02-03 NOTE — Discharge Instructions (Signed)

## 2012-02-06 LAB — CULTURE, BLOOD (ROUTINE X 2)
Culture  Setup Time: 201305060159
Culture  Setup Time: 201305060159
Culture: NO GROWTH
Culture: NO GROWTH

## 2012-02-24 ENCOUNTER — Other Ambulatory Visit: Payer: Self-pay | Admitting: Oncology

## 2012-06-20 ENCOUNTER — Ambulatory Visit
Admission: RE | Admit: 2012-06-20 | Discharge: 2012-06-20 | Disposition: A | Discharge status: Home / Self Care | Payer: Medicare Other | Source: Ambulatory Visit | Attending: Oncology | Admitting: Oncology

## 2012-06-20 DIAGNOSIS — E559 Vitamin D deficiency, unspecified: Secondary | ICD-10-CM

## 2012-06-20 DIAGNOSIS — C50919 Malignant neoplasm of unspecified site of unspecified female breast: Secondary | ICD-10-CM

## 2012-12-20 ENCOUNTER — Encounter: Payer: Self-pay | Admitting: Oncology

## 2012-12-20 ENCOUNTER — Telehealth: Payer: Self-pay | Admitting: *Deleted

## 2012-12-20 NOTE — Telephone Encounter (Signed)
Pt called in question about her f/u appt since Dr. Donnie Coffin is no longer here.  Confirmed 01/01/13 appt w/ pt.  Mailed letter & calendar to pt.

## 2013-01-01 ENCOUNTER — Ambulatory Visit (HOSPITAL_BASED_OUTPATIENT_CLINIC_OR_DEPARTMENT_OTHER): Payer: Medicare Other | Admitting: Gynecologic Oncology

## 2013-01-01 ENCOUNTER — Encounter: Payer: Self-pay | Admitting: Gynecologic Oncology

## 2013-01-01 ENCOUNTER — Telehealth: Payer: Self-pay | Admitting: *Deleted

## 2013-01-01 VITALS — BP 96/60 | HR 93 | Temp 98.5°F | Resp 20 | Ht 62.0 in | Wt 119.3 lb

## 2013-01-01 DIAGNOSIS — M899 Disorder of bone, unspecified: Secondary | ICD-10-CM

## 2013-01-01 DIAGNOSIS — Z853 Personal history of malignant neoplasm of breast: Secondary | ICD-10-CM | POA: Insufficient documentation

## 2013-01-01 DIAGNOSIS — R110 Nausea: Secondary | ICD-10-CM

## 2013-01-01 DIAGNOSIS — C50919 Malignant neoplasm of unspecified site of unspecified female breast: Secondary | ICD-10-CM

## 2013-01-01 DIAGNOSIS — M8580 Other specified disorders of bone density and structure, unspecified site: Secondary | ICD-10-CM

## 2013-01-01 DIAGNOSIS — Z170 Estrogen receptor positive status [ER+]: Secondary | ICD-10-CM

## 2013-01-01 DIAGNOSIS — Z780 Asymptomatic menopausal state: Secondary | ICD-10-CM

## 2013-01-01 DIAGNOSIS — M949 Disorder of cartilage, unspecified: Secondary | ICD-10-CM

## 2013-01-01 DIAGNOSIS — M810 Age-related osteoporosis without current pathological fracture: Secondary | ICD-10-CM

## 2013-01-01 MED ORDER — ZOLEDRONIC ACID 4 MG/100ML IV SOLN: 4.0000 mg | Freq: Once | INTRAVENOUS | Status: DC

## 2013-01-01 MED ORDER — ONDANSETRON HCL 8 MG PO TABS: 8.0000 mg | ORAL_TABLET | Freq: Two times a day (BID) | ORAL | Status: DC | PRN

## 2013-01-01 MED ORDER — ZOLEDRONIC ACID 4 MG/5ML IV CONC: 4.0000 mg | Freq: Once | INTRAVENOUS | Status: DC

## 2013-01-01 NOTE — Progress Notes (Signed)
ID: [name]   DOB: 05-05-1946  MR#: [id number]  WUJ#:[id number]  PCP: Barbara Showers, MD GYN:  None SU: in [place] OTHER MD:  Cardiologist   HISTORY OF PRESENT ILLNESS:  [name] is a 67 year old [place] woman, who was diagnosed with breast cancer in November 2008.  She initially presented with a right breast mass, which was biopsied.  Subsequently, she had a lumpectomy and lymph node dissection on 09/08/2007.  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  She apparently had 8/14 involved lymph nodes.  She subsequently underwent adjuvant chemotherapy with Taxotere, Carboplatin and Herceptin.  This was complicated by a bowel infarction and she ultimately required resection and had a diverting ileostomy which ultimately was reversed after 8 months.  She was hospitalized for 5 weeks and was in rehab for 4 weeks.  She also had a hernia that required opening and ongoing dressings.  She completed chemotherapy after she recovered from all that and then had a year of Herceptin which she completed in May 2010.  She subsequently had radiation therapy to the right breast and axilla.  She was started on Arimidex and has been on Arimidex ever since.    INTERVAL HISTORY:  She presents today for continued follow up.  She reports tolerating Arimidex well since 2010 with no hot flashes, joint aches, or vaginal dryness.  She voices concerns about receiving [name] since she was supposed to be pre-certified for the injection at her last visit in 11/2011.  No other concerns voiced.   REVIEW OF SYSTEMS: Constitutional: Feels well.  Cardiovascular: No chest pain or edema.  Intermittent shortness of breath related to COPD  Pulmonary: No wheeze.  Cough related to COPD.  Gastrointestinal: No nausea, vomiting, or diarrhea. No bright red blood per rectum or change in bowel movement.   Genitourinary: No frequency, urgency, or dysuria. No vaginal bleeding or discharge.  Musculoskeletal: No myalgia or joint pain. Neurologic: No weakness, numbness, or change in gait.  Psychology: No depression, anxiety, or insomnia  PAST MEDICAL HISTORY: Past Medical History  Diagnosis Date  . Angina   . Heart murmur   . Depressed   . Coronary artery disease 2006    2 stents w/previous MI  . Hypertension   . Migraine   . Anxiety   . Myocardial infarction 2006  . COPD (chronic obstructive pulmonary disease)     oxygen-dependent 4LPM [place]  . Moderate to severe pulmonary hypertension   . Diastolic CHF   . Cancer of breast dx'd 2008    RT Breast    PAST SURGICAL HISTORY: Past Surgical History  Procedure Laterality Date  . Breast lumpectomy      Right  . Cardiac catheterization  2006  . Cholecystectomy    . Abdominal surgery      FAMILY HISTORY Family History  Problem Relation Age of Onset  . Schizophrenia Sister     GYNECOLOGIC HISTORY: She is G4, P3.  Menarche age 25.  Menopause at age 35.  No history of hormone replacement therapy.   SOCIAL HISTORY:  She is currently caring for her two grandchildren and living with her daughter.  She is widowed.    ADVANCED DIRECTIVES:  HEALTH MAINTENANCE: History  Substance Use Topics  . Smoking status: Current Every Day Smoker -- 47 years    Types: Cigarettes  . Smokeless tobacco: Never Used  . Alcohol Use: Yes  Colonoscopy:  Not had.  PAP: 2010.  Advised of the importance  Bone density: 11/01/11 resulting osteopenia  Lipid panel:  Managed by Dr. Clent Hopkins  Allergies  Allergen Reactions  . Ceftriaxone Other (See Comments)    "Blow up like a balloon"    Current Outpatient Prescriptions  Medication Sig Dispense Refill  . albuterol (PROVENTIL HFA;VENTOLIN HFA) 108 (90 BASE) MCG/ACT inhaler Inhale 2 puffs into the lungs every 4 (four) hours as needed. Shortness of breath and wheezing  Patient is using Pro-Air brand       . ALPRAZolam (XANAX) 1 MG tablet Take 1 mg by mouth 3 (three) times daily as needed. For anxitey       . anastrozole (ARIMIDEX) 1 MG tablet Take 1 mg by mouth daily.        Marland Kitchen aspirin 325 MG tablet Take 325 mg by mouth daily.        Marland Kitchen atorvastatin (LIPITOR) 80 MG tablet Take 80 mg by mouth at bedtime.        . butalbital-acetaminophen-caffeine (FIORICET, ESGIC) 50-325-40 MG per tablet Take 1 tablet by mouth every 4 (four) hours as needed. For headaches       . calcium-vitamin D (OSCAL WITH D) 250-125 MG-UNIT per tablet Take 1 tablet by mouth daily.      . diclofenac sodium (VOLTAREN) 1 % GEL Apply 1 application topically daily as needed. Hand pain      . enalapril (VASOTEC) 5 MG tablet Take 5 mg by mouth daily.       . fluticasone (FLOVENT HFA) 220 MCG/ACT inhaler Inhale 2 puffs into the lungs 2 (two) times daily. For shortness of breath  1 Inhaler  3  . ipratropium (ATROVENT HFA) 17 MCG/ACT inhaler Inhale 2 puffs into the lungs every 4 (four) hours as needed. For shortness of breath       . ipratropium-albuterol (DUONEB) 0.5-2.5 (3) MG/3ML SOLN Take 3 mLs by nebulization every 4 (four) hours as needed.      . lactulose (CHRONULAC) 10 GM/15ML solution Take 30 g by mouth as needed.        . montelukast (SINGULAIR) 10 MG tablet Take 10 mg by mouth at bedtime.        . Multiple Vitamins-Minerals (MULTIVITAMIN WITH MINERALS) tablet Take 1 tablet by mouth daily.      . nortriptyline (PAMELOR) 25 MG capsule Take 25 mg by mouth at bedtime.        Marland Kitchen omega-3 acid ethyl esters (LOVAZA) 1 G capsule Take 1 g by mouth daily.      . ondansetron (ZOFRAN) 8 MG tablet Take 1 tablet (8 mg total) by mouth every 12 (twelve) hours as needed.  30 tablet  3  . pantoprazole (PROTONIX) 40 MG tablet Take 40 mg by mouth daily.      . potassium chloride SA (K-DUR,KLOR-CON) 20 MEQ tablet Take 20 mEq by mouth daily.      . butalbital-acetaminophen-caffeine (FIORICET WITH CODEINE) 50-325-40-30 MG per capsule Take 1 capsule by  mouth at bedtime as needed.      . furosemide (LASIX) 40 MG tablet Take 1 tablet (40 mg total) by mouth 2 (two) times daily.  60 tablet  3  . [DISCONTINUED] tiotropium (SPIRIVA) 18 MCG inhalation capsule Place 1 capsule (18 mcg total) into inhaler and inhale daily.  30 capsule  3   No current facility-administered medications for this visit.    OBJECTIVE: Filed Vitals:   01/01/13 1309  BP: 96/60  Pulse:  93  Temp: 98.5 F (36.9 C)  Resp: 20     Body mass index is 21.81 kg/(m^2).    ECOG FS:  Asymptomatic  General: Well developed, well nourished female in no acute distress. Alert and oriented x 3.  Head/ Neck: Oropharynx clear.  Sclerae anicteric.  Supple without any enlargements.  Lymph node survey: No cervical, supraclavicular, or axillary adenopathy  Cardiovascular: Regular rate and rhythm. S1 and S2 normal.  Lungs: Lungs course with scattered wheezes and mild bibasilar rales. Skin: No rashes or lesions present. Back: No CVA tenderness.  Abdomen: Abdomen soft, non-tender and obese. Active bowel sounds in all quadrants. No evidence of a fluid wave or abdominal masses.  Breasts: Inspection negative with no nodularity, masses, erythema, or discharge noted bilaterally. Extremities: No bilateral cyanosis, edema, or clubbing.    LAB RESULTS: Lab Results  Component Value Date   WBC 4.8 02/01/2012   NEUTROABS 3.5 02/01/2012   HGB 11.4* 02/01/2012   HCT 35.4* 02/01/2012   MCV 94.4 02/01/2012   PLT 246 02/01/2012      Chemistry      Component Value Date/Time   NA 137 02/01/2012 0348   K 4.6 02/01/2012 0348   CL 100 02/01/2012 0348   CO2 30 02/01/2012 0348   BUN 14 02/01/2012 0348   CREATININE 0.93 02/01/2012 0348      Component Value Date/Time   CALCIUM 9.6 02/01/2012 0348   ALKPHOS 204* 02/01/2012 0348   AST 56* 02/01/2012 0348   ALT 60* 02/01/2012 0348   BILITOT 0.1* 02/01/2012 0348       Lab Results  Component Value Date   LABCA2 24 07/28/2011    No components found with this basename:  [id number]    No results found for this basename: INR,  in the last 168 hours  Urinalysis    Component Value Date/Time   COLORURINE AMBER* 01/30/2012 2040   APPEARANCEUR CLEAR 01/30/2012 2040   LABSPEC 1.020 01/30/2012 2040   PHURINE 6.0 01/30/2012 2040   GLUCOSEU NEGATIVE 01/30/2012 2040   HGBUR NEGATIVE 01/30/2012 2040   BILIRUBINUR SMALL* 01/30/2012 2040   KETONESUR TRACE* 01/30/2012 2040   PROTEINUR NEGATIVE 01/30/2012 2040   UROBILINOGEN 1.0 01/30/2012 2040   NITRITE NEGATIVE 01/30/2012 2040   LEUKOCYTESUR NEGATIVE 01/30/2012 2040    STUDIES: No results found.  ASSESSMENT: 67 y.o. [place] woman: #1  S/P right breast lumpectomy with lymph node dissection on 09/08/07 in [place] for a T1c N2a IDC of the right breast, ER+, PR-, HER2 3+ with 8 of 14 nodes involved.  #2  She completed [facility] with Herceptin for one year in May 2010.  #3  She underwent radiation therapy in May of 2010.  #4  She started Arimidex in 2010 and has tolerated it well since.   PLAN:  She is to be scheduled for a Zometa infusion as soon as prior authorization is obtained.  Patient requested January 17, 2012 and in six months per Dr. Darnelle Catalan.  She is to continue taking Arimidex as prescribed and follow up in 6 months or sooner if needed.  She is advised to continue her smoking cessation efforts and to call for any questions or concerns.  The patient was reviewed with Dr. Darnelle Catalan, who spoke with the patient about future plans and recommendations.   Konnie Noffsinger DEAL    01/01/2013

## 2013-01-01 NOTE — Telephone Encounter (Signed)
appts made and printed 

## 2013-01-01 NOTE — Patient Instructions (Signed)
Doing well.  Plan on following up with Dr. Darnelle Catalan in 6 months or sooner if needed.  We will schedule you for your Zometa injection.  Zoledronic Acid injection (Paget's Disease, Osteoporosis) What is this medicine? ZOLEDRONIC ACID (ZOE le dron ik AS id) lowers the amount of calcium loss from bone. It is used to treat Paget's disease and osteoporosis in women. This medicine may be used for other purposes; ask your health care provider or pharmacist if you have questions. What should I tell my health care provider before I take this medicine? They need to know if you have any of these conditions: -aspirin-sensitive asthma -dental disease -kidney disease -low levels of calcium in the blood -past surgery on the parathyroid gland or intestines -an unusual or allergic reaction to zoledronic acid, other medicines, foods, dyes, or preservatives -pregnant or trying to get pregnant -breast-feeding How should I use this medicine? This medicine is for infusion into a vein. It is given by a health care professional in a hospital or clinic setting. Talk to your pediatrician regarding the use of this medicine in children. This medicine is not approved for use in children. Overdosage: If you think you have taken too much of this medicine contact a poison control center or emergency room at once. NOTE: This medicine is only for you. Do not share this medicine with others. What if I miss a dose? It is important not to miss your dose. Call your doctor or health care professional if you are unable to keep an appointment. What may interact with this medicine? -certain antibiotics given by injection -NSAIDs, medicines for pain and inflammation, like ibuprofen or naproxen -some diuretics like bumetanide, furosemide -teriparatide This list may not describe all possible interactions. Give your health care provider a list of all the medicines, herbs, non-prescription drugs, or dietary supplements you use. Also  tell them if you smoke, drink alcohol, or use illegal drugs. Some items may interact with your medicine. What should I watch for while using this medicine? Visit your doctor or health care professional for regular checkups. It may be some time before you see the benefit from this medicine. Do not stop taking your medicine unless your doctor tells you to. Your doctor may order blood tests or other tests to see how you are doing. Women should inform their doctor if they wish to become pregnant or think they might be pregnant. There is a potential for serious side effects to an unborn child. Talk to your health care professional or pharmacist for more information. You should make sure that you get enough calcium and vitamin D while you are taking this medicine. Discuss the foods you eat and the vitamins you take with your health care professional. Some people who take this medicine have severe bone, joint, and/or muscle pain. This medicine may also increase your risk for a broken thigh bone. Tell your doctor right away if you have pain in your upper leg or groin. Tell your doctor if you have any pain that does not go away or that gets worse. What side effects may I notice from receiving this medicine? Side effects that you should report to your doctor or health care professional as soon as possible: -allergic reactions like skin rash, itching or hives, swelling of the face, lips, or tongue -breathing problems -changes in vision -feeling faint or lightheaded, falls -jaw burning, cramping, or pain -muscle cramps, stiffness, or weakness -trouble passing urine or change in the amount of urine Side effects  that usually do not require medical attention (report to your doctor or health care professional if they continue or are bothersome): -bone, joint, or muscle pain -fever -irritation at site where injected -loss of appetite -nausea, vomiting -stomach upset -tired This list may not describe all  possible side effects. Call your doctor for medical advice about side effects. You may report side effects to FDA at 1-800-FDA-1088. Where should I keep my medicine? This drug is given in a hospital or clinic and will not be stored at home. NOTE: This sheet is a summary. It may not cover all possible information. If you have questions about this medicine, talk to your doctor, pharmacist, or health care provider.  2013, [employer]. (03/12/2011 9:08:15 AM)

## 2013-01-16 ENCOUNTER — Ambulatory Visit (HOSPITAL_BASED_OUTPATIENT_CLINIC_OR_DEPARTMENT_OTHER): Payer: Medicare Other

## 2013-01-16 ENCOUNTER — Ambulatory Visit: Payer: Medicare Other

## 2013-01-16 VITALS — BP 109/59 | HR 89 | Temp 98.4°F | Resp 18

## 2013-01-16 DIAGNOSIS — S32010A Wedge compression fracture of first lumbar vertebra, initial encounter for closed fracture: Secondary | ICD-10-CM

## 2013-01-16 DIAGNOSIS — M899 Disorder of bone, unspecified: Secondary | ICD-10-CM

## 2013-01-16 DIAGNOSIS — R945 Abnormal results of liver function studies: Secondary | ICD-10-CM

## 2013-01-16 DIAGNOSIS — Z853 Personal history of malignant neoplasm of breast: Secondary | ICD-10-CM

## 2013-01-16 DIAGNOSIS — I472 Ventricular tachycardia: Secondary | ICD-10-CM

## 2013-01-16 DIAGNOSIS — Z720 Tobacco use: Secondary | ICD-10-CM

## 2013-01-16 DIAGNOSIS — I272 Pulmonary hypertension, unspecified: Secondary | ICD-10-CM

## 2013-01-16 DIAGNOSIS — J189 Pneumonia, unspecified organism: Secondary | ICD-10-CM

## 2013-01-16 DIAGNOSIS — I350 Nonrheumatic aortic (valve) stenosis: Secondary | ICD-10-CM

## 2013-01-16 DIAGNOSIS — E785 Hyperlipidemia, unspecified: Secondary | ICD-10-CM

## 2013-01-16 DIAGNOSIS — I5032 Chronic diastolic (congestive) heart failure: Secondary | ICD-10-CM

## 2013-01-16 DIAGNOSIS — J9610 Chronic respiratory failure, unspecified whether with hypoxia or hypercapnia: Secondary | ICD-10-CM

## 2013-01-16 DIAGNOSIS — M7989 Other specified soft tissue disorders: Secondary | ICD-10-CM

## 2013-01-16 DIAGNOSIS — E860 Dehydration: Secondary | ICD-10-CM

## 2013-01-16 DIAGNOSIS — Z9049 Acquired absence of other specified parts of digestive tract: Secondary | ICD-10-CM

## 2013-01-16 DIAGNOSIS — M8580 Other specified disorders of bone density and structure, unspecified site: Secondary | ICD-10-CM

## 2013-01-16 DIAGNOSIS — I2510 Atherosclerotic heart disease of native coronary artery without angina pectoris: Secondary | ICD-10-CM

## 2013-01-16 DIAGNOSIS — E871 Hypo-osmolality and hyponatremia: Secondary | ICD-10-CM

## 2013-01-16 DIAGNOSIS — I509 Heart failure, unspecified: Secondary | ICD-10-CM

## 2013-01-16 DIAGNOSIS — E875 Hyperkalemia: Secondary | ICD-10-CM

## 2013-01-16 DIAGNOSIS — E872 Acidosis: Secondary | ICD-10-CM

## 2013-01-16 DIAGNOSIS — R4182 Altered mental status, unspecified: Secondary | ICD-10-CM

## 2013-01-16 DIAGNOSIS — R0602 Shortness of breath: Secondary | ICD-10-CM

## 2013-01-16 DIAGNOSIS — J449 Chronic obstructive pulmonary disease, unspecified: Secondary | ICD-10-CM

## 2013-01-16 DIAGNOSIS — R0689 Other abnormalities of breathing: Secondary | ICD-10-CM

## 2013-01-16 DIAGNOSIS — C50919 Malignant neoplasm of unspecified site of unspecified female breast: Secondary | ICD-10-CM

## 2013-01-16 DIAGNOSIS — N179 Acute kidney failure, unspecified: Secondary | ICD-10-CM

## 2013-01-16 MED ORDER — SODIUM CHLORIDE 0.9 % IV SOLN
Freq: Once | INTRAVENOUS | Status: AC
  Administered 2013-01-16: 11:00:00 via INTRAVENOUS

## 2013-01-16 MED ORDER — ZOLEDRONIC ACID 4 MG/100ML IV SOLN
4.0000 mg | Freq: Once | INTRAVENOUS | Status: AC
  Administered 2013-01-16: 4 mg via INTRAVENOUS
  Filled 2013-01-16: qty 100

## 2013-01-16 MED ORDER — ZOLEDRONIC ACID 4 MG/5ML IV CONC: 4.0000 mg | Freq: Once | INTRAVENOUS | Status: DC

## 2013-01-16 NOTE — Patient Instructions (Signed)
Zoledronic Acid injection (Hypercalcemia, Oncology) What is this medicine? ZOLEDRONIC ACID (ZOE le dron ik AS id) lowers the amount of calcium loss from bone. It is used to treat too much calcium in your blood from cancer. It is also used to prevent complications of cancer that has spread to the bone. This medicine may be used for other purposes; ask your health care provider or pharmacist if you have questions. What should I tell my health care provider before I take this medicine? They need to know if you have any of these conditions: -aspirin-sensitive asthma -dental disease -kidney disease -an unusual or allergic reaction to zoledronic acid, other medicines, foods, dyes, or preservatives -pregnant or trying to get pregnant -breast-feeding How should I use this medicine? This medicine is for infusion into a vein. It is given by a health care professional in a hospital or clinic setting. Talk to your pediatrician regarding the use of this medicine in children. Special care may be needed. Overdosage: If you think you have taken too much of this medicine contact a poison control center or emergency room at once. NOTE: This medicine is only for you. Do not share this medicine with others. What if I miss a dose? It is important not to miss your dose. Call your doctor or health care professional if you are unable to keep an appointment. What may interact with this medicine? -certain antibiotics given by injection -NSAIDs, medicines for pain and inflammation, like ibuprofen or naproxen -some diuretics like bumetanide, furosemide -teriparatide -thalidomide This list may not describe all possible interactions. Give your health care provider a list of all the medicines, herbs, non-prescription drugs, or dietary supplements you use. Also tell them if you smoke, drink alcohol, or use illegal drugs. Some items may interact with your medicine. What should I watch for while using this medicine? Visit  your doctor or health care professional for regular checkups. It may be some time before you see the benefit from this medicine. Do not stop taking your medicine unless your doctor tells you to. Your doctor may order blood tests or other tests to see how you are doing. Women should inform their doctor if they wish to become pregnant or think they might be pregnant. There is a potential for serious side effects to an unborn child. Talk to your health care professional or pharmacist for more information. You should make sure that you get enough calcium and vitamin D while you are taking this medicine. Discuss the foods you eat and the vitamins you take with your health care professional. Some people who take this medicine have severe bone, joint, and/or muscle pain. This medicine may also increase your risk for a broken thigh bone. Tell your doctor right away if you have pain in your upper leg or groin. Tell your doctor if you have any pain that does not go away or that gets worse. What side effects may I notice from receiving this medicine? Side effects that you should report to your doctor or health care professional as soon as possible: -allergic reactions like skin rash, itching or hives, swelling of the face, lips, or tongue -anxiety, confusion, or depression -breathing problems -changes in vision -feeling faint or lightheaded, falls -jaw burning, cramping, pain -muscle cramps, stiffness, or weakness -trouble passing urine or change in the amount of urine Side effects that usually do not require medical attention (report to your doctor or health care professional if they continue or are bothersome): -bone, joint, or muscle pain -  fever -hair loss -irritation at site where injected -loss of appetite -nausea, vomiting -stomach upset -tired This list may not describe all possible side effects. Call your doctor for medical advice about side effects. You may report side effects to FDA at  1-800-FDA-1088. Where should I keep my medicine? This drug is given in a hospital or clinic and will not be stored at home. NOTE: This sheet is a summary. It may not cover all possible information. If you have questions about this medicine, talk to your doctor, pharmacist, or health care provider.  2013, [employer] Standard. (03/12/2011 9:06:58 AM)  

## 2013-02-01 ENCOUNTER — Other Ambulatory Visit (HOSPITAL_COMMUNITY)
Admission: RE | Admit: 2013-02-01 | Discharge: 2013-02-01 | Disposition: A | Discharge status: Home / Self Care | Payer: Medicare Other | Source: Ambulatory Visit | Attending: Obstetrics and Gynecology | Admitting: Obstetrics and Gynecology

## 2013-02-01 ENCOUNTER — Other Ambulatory Visit: Payer: Self-pay | Admitting: Obstetrics and Gynecology

## 2013-02-01 DIAGNOSIS — Z1151 Encounter for screening for human papillomavirus (HPV): Secondary | ICD-10-CM | POA: Insufficient documentation

## 2013-02-01 DIAGNOSIS — Z124 Encounter for screening for malignant neoplasm of cervix: Secondary | ICD-10-CM | POA: Insufficient documentation

## 2013-04-23 ENCOUNTER — Other Ambulatory Visit: Payer: Self-pay | Admitting: Internal Medicine

## 2013-04-23 DIAGNOSIS — Z853 Personal history of malignant neoplasm of breast: Secondary | ICD-10-CM

## 2013-06-19 ENCOUNTER — Encounter (HOSPITAL_COMMUNITY): Payer: Self-pay | Admitting: *Deleted

## 2013-06-19 ENCOUNTER — Emergency Department (HOSPITAL_COMMUNITY): Payer: Medicare Other

## 2013-06-19 ENCOUNTER — Inpatient Hospital Stay (HOSPITAL_COMMUNITY): Payer: Medicare Other

## 2013-06-19 ENCOUNTER — Inpatient Hospital Stay (HOSPITAL_COMMUNITY)
Admission: EM | Admit: 2013-06-19 | Discharge: 2013-06-25 | DRG: 917 | Disposition: A | Discharge status: Home / Self Care | Payer: Medicare Other | Attending: Internal Medicine | Admitting: Internal Medicine

## 2013-06-19 DIAGNOSIS — N179 Acute kidney failure, unspecified: Secondary | ICD-10-CM

## 2013-06-19 DIAGNOSIS — F411 Generalized anxiety disorder: Secondary | ICD-10-CM | POA: Diagnosis present

## 2013-06-19 DIAGNOSIS — Z853 Personal history of malignant neoplasm of breast: Secondary | ICD-10-CM

## 2013-06-19 DIAGNOSIS — F17200 Nicotine dependence, unspecified, uncomplicated: Secondary | ICD-10-CM | POA: Diagnosis present

## 2013-06-19 DIAGNOSIS — Z79899 Other long term (current) drug therapy: Secondary | ICD-10-CM

## 2013-06-19 DIAGNOSIS — T423X4A Poisoning by barbiturates, undetermined, initial encounter: Secondary | ICD-10-CM | POA: Diagnosis present

## 2013-06-19 DIAGNOSIS — E871 Hypo-osmolality and hyponatremia: Secondary | ICD-10-CM

## 2013-06-19 DIAGNOSIS — T400X1A Poisoning by opium, accidental (unintentional), initial encounter: Principal | ICD-10-CM | POA: Diagnosis present

## 2013-06-19 DIAGNOSIS — R7402 Elevation of levels of lactic acid dehydrogenase (LDH): Secondary | ICD-10-CM | POA: Diagnosis present

## 2013-06-19 DIAGNOSIS — J441 Chronic obstructive pulmonary disease with (acute) exacerbation: Secondary | ICD-10-CM | POA: Diagnosis present

## 2013-06-19 DIAGNOSIS — K7200 Acute and subacute hepatic failure without coma: Secondary | ICD-10-CM

## 2013-06-19 DIAGNOSIS — E872 Acidosis, unspecified: Secondary | ICD-10-CM | POA: Diagnosis present

## 2013-06-19 DIAGNOSIS — I5032 Chronic diastolic (congestive) heart failure: Secondary | ICD-10-CM

## 2013-06-19 DIAGNOSIS — I252 Old myocardial infarction: Secondary | ICD-10-CM

## 2013-06-19 DIAGNOSIS — G43909 Migraine, unspecified, not intractable, without status migrainosus: Secondary | ICD-10-CM | POA: Diagnosis present

## 2013-06-19 DIAGNOSIS — I5030 Unspecified diastolic (congestive) heart failure: Secondary | ICD-10-CM | POA: Diagnosis present

## 2013-06-19 DIAGNOSIS — I2510 Atherosclerotic heart disease of native coronary artery without angina pectoris: Secondary | ICD-10-CM | POA: Diagnosis present

## 2013-06-19 DIAGNOSIS — Z720 Tobacco use: Secondary | ICD-10-CM

## 2013-06-19 DIAGNOSIS — Z9861 Coronary angioplasty status: Secondary | ICD-10-CM

## 2013-06-19 DIAGNOSIS — E162 Hypoglycemia, unspecified: Secondary | ICD-10-CM | POA: Diagnosis present

## 2013-06-19 DIAGNOSIS — T424X4A Poisoning by benzodiazepines, undetermined, initial encounter: Secondary | ICD-10-CM | POA: Diagnosis present

## 2013-06-19 DIAGNOSIS — F329 Major depressive disorder, single episode, unspecified: Secondary | ICD-10-CM | POA: Diagnosis present

## 2013-06-19 DIAGNOSIS — Z7982 Long term (current) use of aspirin: Secondary | ICD-10-CM

## 2013-06-19 DIAGNOSIS — R945 Abnormal results of liver function studies: Secondary | ICD-10-CM

## 2013-06-19 DIAGNOSIS — G9340 Encephalopathy, unspecified: Secondary | ICD-10-CM | POA: Diagnosis present

## 2013-06-19 DIAGNOSIS — I509 Heart failure, unspecified: Secondary | ICD-10-CM | POA: Diagnosis present

## 2013-06-19 DIAGNOSIS — I10 Essential (primary) hypertension: Secondary | ICD-10-CM | POA: Diagnosis present

## 2013-06-19 DIAGNOSIS — J449 Chronic obstructive pulmonary disease, unspecified: Secondary | ICD-10-CM

## 2013-06-19 DIAGNOSIS — E785 Hyperlipidemia, unspecified: Secondary | ICD-10-CM

## 2013-06-19 DIAGNOSIS — Z8719 Personal history of other diseases of the digestive system: Secondary | ICD-10-CM

## 2013-06-19 DIAGNOSIS — J45901 Unspecified asthma with (acute) exacerbation: Secondary | ICD-10-CM | POA: Diagnosis present

## 2013-06-19 DIAGNOSIS — Z9981 Dependence on supplemental oxygen: Secondary | ICD-10-CM

## 2013-06-19 DIAGNOSIS — F1910 Other psychoactive substance abuse, uncomplicated: Secondary | ICD-10-CM | POA: Diagnosis present

## 2013-06-19 DIAGNOSIS — F3289 Other specified depressive episodes: Secondary | ICD-10-CM | POA: Diagnosis present

## 2013-06-19 DIAGNOSIS — T43591A Poisoning by other antipsychotics and neuroleptics, accidental (unintentional), initial encounter: Secondary | ICD-10-CM | POA: Diagnosis present

## 2013-06-19 DIAGNOSIS — Z23 Encounter for immunization: Secondary | ICD-10-CM

## 2013-06-19 DIAGNOSIS — R748 Abnormal levels of other serum enzymes: Secondary | ICD-10-CM | POA: Diagnosis present

## 2013-06-19 DIAGNOSIS — F1920 Other psychoactive substance dependence, uncomplicated: Secondary | ICD-10-CM

## 2013-06-19 DIAGNOSIS — R4182 Altered mental status, unspecified: Secondary | ICD-10-CM

## 2013-06-19 DIAGNOSIS — K5900 Constipation, unspecified: Secondary | ICD-10-CM | POA: Diagnosis present

## 2013-06-19 DIAGNOSIS — J9600 Acute respiratory failure, unspecified whether with hypoxia or hypercapnia: Secondary | ICD-10-CM

## 2013-06-19 DIAGNOSIS — C50919 Malignant neoplasm of unspecified site of unspecified female breast: Secondary | ICD-10-CM | POA: Diagnosis present

## 2013-06-19 DIAGNOSIS — R7401 Elevation of levels of liver transaminase levels: Secondary | ICD-10-CM | POA: Diagnosis present

## 2013-06-19 DIAGNOSIS — I2789 Other specified pulmonary heart diseases: Secondary | ICD-10-CM | POA: Diagnosis present

## 2013-06-19 DIAGNOSIS — T391X1A Poisoning by 4-Aminophenol derivatives, accidental (unintentional), initial encounter: Secondary | ICD-10-CM

## 2013-06-19 DIAGNOSIS — E875 Hyperkalemia: Secondary | ICD-10-CM

## 2013-06-19 HISTORY — DX: Acute respiratory failure, unspecified whether with hypoxia or hypercapnia: J96.00

## 2013-06-19 HISTORY — DX: Acute kidney failure, unspecified: N17.9

## 2013-06-19 HISTORY — DX: Acute and subacute hepatic failure without coma: K72.00

## 2013-06-19 LAB — POCT I-STAT 3, ART BLOOD GAS (G3+)
Acid-Base Excess: 3 mmol/L — ABNORMAL HIGH (ref 0.0–2.0)
Acid-Base Excess: 7 mmol/L — ABNORMAL HIGH (ref 0.0–2.0)
Bicarbonate: 32.9 mEq/L — ABNORMAL HIGH (ref 20.0–24.0)
Bicarbonate: 36.6 mEq/L — ABNORMAL HIGH (ref 20.0–24.0)
O2 Saturation: 95 %
O2 Saturation: 98 %
Patient temperature: 98.7
Patient temperature: 98.5
TCO2: 35 mmol/L (ref 0–100)
TCO2: 39 mmol/L (ref 0–100)
pCO2 arterial: 73.8 mmHg (ref 35.0–45.0)
pCO2 arterial: 80.9 mmHg (ref 35.0–45.0)
pH, Arterial: 7.257 — ABNORMAL LOW (ref 7.350–7.450)
pH, Arterial: 7.264 — ABNORMAL LOW (ref 7.350–7.450)
pO2, Arterial: 93 mmHg (ref 80.0–100.0)
pO2, Arterial: 128 mmHg — ABNORMAL HIGH (ref 80.0–100.0)

## 2013-06-19 LAB — COMPREHENSIVE METABOLIC PANEL
ALT: 1128 U/L — ABNORMAL HIGH (ref 0–35)
ALT: 4140 U/L — ABNORMAL HIGH (ref 0–35)
AST: 1163 U/L — ABNORMAL HIGH (ref 0–37)
AST: 4544 U/L — ABNORMAL HIGH (ref 0–37)
Albumin: 3.4 g/dL — ABNORMAL LOW (ref 3.5–5.2)
Albumin: 3.2 g/dL — ABNORMAL LOW (ref 3.5–5.2)
Alkaline Phosphatase: 261 U/L — ABNORMAL HIGH (ref 39–117)
Alkaline Phosphatase: 239 U/L — ABNORMAL HIGH (ref 39–117)
BUN: 16 mg/dL (ref 6–23)
BUN: 19 mg/dL (ref 6–23)
CO2: 28 mEq/L (ref 19–32)
CO2: 31 mEq/L (ref 19–32)
Calcium: 9.2 mg/dL (ref 8.4–10.5)
Calcium: 8.9 mg/dL (ref 8.4–10.5)
Chloride: 98 mEq/L (ref 96–112)
Chloride: 99 mEq/L (ref 96–112)
Creatinine, Ser: 1.5 mg/dL — ABNORMAL HIGH (ref 0.50–1.10)
Creatinine, Ser: 1.44 mg/dL — ABNORMAL HIGH (ref 0.50–1.10)
GFR calc Af Amer: 40 mL/min — ABNORMAL LOW (ref >90)
GFR calc Af Amer: 42 mL/min — ABNORMAL LOW (ref >90)
GFR calc non Af Amer: 35 mL/min — ABNORMAL LOW (ref >90)
GFR calc non Af Amer: 37 mL/min — ABNORMAL LOW (ref >90)
Glucose, Bld: 74 mg/dL (ref 70–99)
Glucose, Bld: 98 mg/dL (ref 70–99)
Potassium: 7.4 mEq/L (ref 3.5–5.1)
Potassium: 6.1 mEq/L — ABNORMAL HIGH (ref 3.5–5.1)
Sodium: 138 mEq/L (ref 135–145)
Sodium: 140 mEq/L (ref 135–145)
Total Bilirubin: 0.5 mg/dL (ref 0.3–1.2)
Total Bilirubin: 0.3 mg/dL (ref 0.3–1.2)
Total Protein: 8.1 g/dL (ref 6.0–8.3)
Total Protein: 7.4 g/dL (ref 6.0–8.3)

## 2013-06-19 LAB — URINALYSIS, ROUTINE W REFLEX MICROSCOPIC
Glucose, UA: NEGATIVE mg/dL
Hgb urine dipstick: NEGATIVE
Ketones, ur: NEGATIVE mg/dL
Leukocytes, UA: NEGATIVE
Nitrite: NEGATIVE
Protein, ur: 30 mg/dL — AB
Specific Gravity, Urine: 1.018 (ref 1.005–1.030)
Urobilinogen, UA: 1 mg/dL (ref 0.0–1.0)
pH: 5 (ref 5.0–8.0)

## 2013-06-19 LAB — PROTIME-INR
INR: 1.43 (ref 0.00–1.49)
Prothrombin Time: 17.1 seconds — ABNORMAL HIGH (ref 11.6–15.2)

## 2013-06-19 LAB — CBC WITH DIFFERENTIAL/PLATELET
Basophils Absolute: 0 10*3/uL (ref 0.0–0.1)
Basophils Relative: 0 % (ref 0–1)
Eosinophils Absolute: 0 10*3/uL (ref 0.0–0.7)
Eosinophils Relative: 0 % (ref 0–5)
HCT: 46.1 % — ABNORMAL HIGH (ref 36.0–46.0)
Hemoglobin: 15.1 g/dL — ABNORMAL HIGH (ref 12.0–15.0)
Lymphocytes Relative: 6 % — ABNORMAL LOW (ref 12–46)
Lymphs Abs: 0.5 10*3/uL — ABNORMAL LOW (ref 0.7–4.0)
MCH: 33.1 pg (ref 26.0–34.0)
MCHC: 32.8 g/dL (ref 30.0–36.0)
MCV: 101.1 fL — ABNORMAL HIGH (ref 78.0–100.0)
Monocytes Absolute: 0.9 10*3/uL (ref 0.1–1.0)
Monocytes Relative: 11 % (ref 3–12)
Neutro Abs: 6.4 10*3/uL (ref 1.7–7.7)
Neutrophils Relative %: 82 % — ABNORMAL HIGH (ref 43–77)
Platelets: 234 10*3/uL (ref 150–400)
RBC: 4.56 MIL/uL (ref 3.87–5.11)
RDW: 13.7 % (ref 11.5–15.5)
WBC: 7.8 10*3/uL (ref 4.0–10.5)

## 2013-06-19 LAB — TROPONIN I
Troponin I: <0.3 ng/mL (ref <0.30)
Troponin I: 0.42 ng/mL (ref <0.30)

## 2013-06-19 LAB — URINE MICROSCOPIC-ADD ON

## 2013-06-19 LAB — GLUCOSE, CAPILLARY
Glucose-Capillary: 130 mg/dL — ABNORMAL HIGH (ref 70–99)
Glucose-Capillary: 63 mg/dL — ABNORMAL LOW (ref 70–99)

## 2013-06-19 LAB — RAPID URINE DRUG SCREEN, HOSP PERFORMED
Amphetamines: NOT DETECTED
Barbiturates: POSITIVE — AB
Benzodiazepines: POSITIVE — AB
Cocaine: NOT DETECTED
Opiates: POSITIVE — AB
Tetrahydrocannabinol: NOT DETECTED

## 2013-06-19 LAB — POCT I-STAT TROPONIN I: Troponin i, poc: 0.18 ng/mL (ref 0.00–0.08)

## 2013-06-19 LAB — PRO B NATRIURETIC PEPTIDE: Pro B Natriuretic peptide (BNP): 5267 pg/mL — ABNORMAL HIGH (ref 0–125)

## 2013-06-19 LAB — APTT: aPTT: 34 seconds (ref 24–37)

## 2013-06-19 LAB — LACTIC ACID, PLASMA
Lactic Acid, Venous: 3.4 mmol/L — ABNORMAL HIGH (ref 0.5–2.2)
Lactic Acid, Venous: 1.1 mmol/L (ref 0.5–2.2)

## 2013-06-19 LAB — ACETAMINOPHEN LEVEL: Acetaminophen (Tylenol), Serum: <15 ug/mL (ref 10–30)

## 2013-06-19 LAB — POTASSIUM: Potassium: 5.8 mEq/L — ABNORMAL HIGH (ref 3.5–5.1)

## 2013-06-19 MED ORDER — SODIUM POLYSTYRENE SULFONATE 15 GM/60ML PO SUSP
15.0000 g | Freq: Once | ORAL | Status: AC
  Administered 2013-06-20: 15 g
  Filled 2013-06-19: qty 60

## 2013-06-19 MED ORDER — DEXTROSE 5 % IV SOLN
15.0000 mg/kg/h | INTRAVENOUS | Status: DC
  Administered 2013-06-20 – 2013-06-23 (×4): 15 mg/kg/h via INTRAVENOUS
  Filled 2013-06-19 (×7): qty 150

## 2013-06-19 MED ORDER — CHLORHEXIDINE GLUCONATE 0.12 % MT SOLN: 15.0000 mL | Freq: Two times a day (BID) | OROMUCOSAL | Status: DC

## 2013-06-19 MED ORDER — SODIUM BICARBONATE 8.4 % IV SOLN
50.0000 meq | Freq: Once | INTRAVENOUS | Status: AC
  Administered 2013-06-19: 50 meq via INTRAVENOUS
  Filled 2013-06-19: qty 50

## 2013-06-19 MED ORDER — DEXTROSE 50 % IV SOLN
INTRAVENOUS | Status: AC
  Administered 2013-06-19: 25 mL
  Filled 2013-06-19: qty 50

## 2013-06-19 MED ORDER — DEXTROSE 50 % IV SOLN
50.0000 mL | Freq: Once | INTRAVENOUS | Status: AC
  Administered 2013-06-20: 50 mL via INTRAVENOUS
  Filled 2013-06-19: qty 50

## 2013-06-19 MED ORDER — NALOXONE HCL 0.4 MG/ML IJ SOLN
INTRAMUSCULAR | Status: AC
  Administered 2013-06-19: 0.4 mg
  Filled 2013-06-19: qty 1

## 2013-06-19 MED ORDER — NALOXONE HCL 0.4 MG/ML IJ SOLN: 0.4000 mg | Freq: Once | INTRAMUSCULAR | Status: DC

## 2013-06-19 MED ORDER — MIDAZOLAM HCL 2 MG/2ML IJ SOLN
2.0000 mg | Freq: Once | INTRAMUSCULAR | Status: AC
  Administered 2013-06-19: 2 mg via INTRAVENOUS

## 2013-06-19 MED ORDER — METHYLPREDNISOLONE SODIUM SUCC 125 MG IJ SOLR
80.0000 mg | Freq: Three times a day (TID) | INTRAMUSCULAR | Status: DC
  Administered 2013-06-20 – 2013-06-21 (×5): 80 mg via INTRAVENOUS
  Filled 2013-06-19 (×7): qty 1.28

## 2013-06-19 MED ORDER — ATORVASTATIN CALCIUM 80 MG PO TABS: 80.0000 mg | ORAL_TABLET | Freq: Every day | ORAL | Status: DC

## 2013-06-19 MED ORDER — IPRATROPIUM BROMIDE HFA 17 MCG/ACT IN AERS
6.0000 | INHALATION_SPRAY | RESPIRATORY_TRACT | Status: DC
  Administered 2013-06-19: 6 via RESPIRATORY_TRACT
  Filled 2013-06-19: qty 12.9

## 2013-06-19 MED ORDER — SODIUM CHLORIDE 0.9 % IV SOLN
Freq: Once | INTRAVENOUS | Status: AC
  Administered 2013-06-19: 20 mL/h via INTRAVENOUS

## 2013-06-19 MED ORDER — SODIUM CHLORIDE 0.9 % IV SOLN
1.0000 mg/h | INTRAVENOUS | Status: DC
  Administered 2013-06-19 – 2013-06-20 (×2): 2 mg/h via INTRAVENOUS
  Filled 2013-06-19 (×2): qty 10

## 2013-06-19 MED ORDER — MIDAZOLAM HCL 2 MG/2ML IJ SOLN
2.0000 mg | Freq: Once | INTRAMUSCULAR | Status: AC
  Administered 2013-06-19: 2 mg via INTRAVENOUS
  Filled 2013-06-19: qty 2

## 2013-06-19 MED ORDER — FUROSEMIDE 10 MG/ML IJ SOLN
40.0000 mg | Freq: Once | INTRAMUSCULAR | Status: AC
  Administered 2013-06-19: 40 mg via INTRAVENOUS
  Filled 2013-06-19: qty 4

## 2013-06-19 MED ORDER — INSULIN ASPART 100 UNIT/ML ~~LOC~~ SOLN
10.0000 [IU] | Freq: Once | SUBCUTANEOUS | Status: AC
  Administered 2013-06-20: 10 [IU] via INTRAVENOUS

## 2013-06-19 MED ORDER — ETOMIDATE 2 MG/ML IV SOLN
0.3000 mg/kg | Freq: Once | INTRAVENOUS | Status: AC
  Administered 2013-06-19: 20 mg via INTRAVENOUS

## 2013-06-19 MED ORDER — SODIUM CHLORIDE 0.9 % IV SOLN
1.0000 g | Freq: Once | INTRAVENOUS | Status: AC
  Administered 2013-06-20: 1 g via INTRAVENOUS
  Filled 2013-06-19: qty 10

## 2013-06-19 MED ORDER — HEPARIN SODIUM (PORCINE) 5000 UNIT/ML IJ SOLN
5000.0000 [IU] | Freq: Three times a day (TID) | INTRAMUSCULAR | Status: DC
  Administered 2013-06-20 – 2013-06-25 (×16): 5000 [IU] via SUBCUTANEOUS
  Filled 2013-06-19 (×20): qty 1

## 2013-06-19 MED ORDER — ALBUTEROL SULFATE HFA 108 (90 BASE) MCG/ACT IN AERS: 6.0000 | INHALATION_SPRAY | RESPIRATORY_TRACT | Status: DC | PRN

## 2013-06-19 MED ORDER — BIOTENE DRY MOUTH MT LIQD
1.0000 "application " | Freq: Four times a day (QID) | OROMUCOSAL | Status: DC
  Administered 2013-06-20 – 2013-06-21 (×8): 15 mL via OROMUCOSAL

## 2013-06-19 MED ORDER — ASPIRIN 325 MG PO TABS
325.0000 mg | ORAL_TABLET | Freq: Every day | ORAL | Status: DC
  Administered 2013-06-20 – 2013-06-24 (×5): 325 mg via ORAL
  Filled 2013-06-19 (×6): qty 1

## 2013-06-19 MED ORDER — ACETYLCYSTEINE LOAD VIA INFUSION
150.0000 mg/kg | Freq: Once | INTRAVENOUS | Status: AC
  Administered 2013-06-20: 8100 mg via INTRAVENOUS
  Filled 2013-06-19: qty 203

## 2013-06-19 MED ORDER — ONDANSETRON HCL 4 MG/2ML IJ SOLN
4.0000 mg | Freq: Once | INTRAMUSCULAR | Status: AC
  Administered 2013-06-19: 4 mg via INTRAVENOUS
  Filled 2013-06-19: qty 2

## 2013-06-19 MED ORDER — ALBUTEROL SULFATE HFA 108 (90 BASE) MCG/ACT IN AERS
6.0000 | INHALATION_SPRAY | RESPIRATORY_TRACT | Status: DC
  Administered 2013-06-19: 6 via RESPIRATORY_TRACT
  Filled 2013-06-19: qty 6.7

## 2013-06-19 MED ORDER — SODIUM CHLORIDE 0.9 % IV SOLN
25.0000 ug/h | INTRAVENOUS | Status: DC
  Administered 2013-06-19: 50 ug/h via INTRAVENOUS
  Administered 2013-06-20 – 2013-06-21 (×2): 80 ug/h via INTRAVENOUS
  Filled 2013-06-19 (×2): qty 50

## 2013-06-19 MED ORDER — ASPIRIN 300 MG RE SUPP
300.0000 mg | Freq: Once | RECTAL | Status: DC
  Filled 2013-06-19: qty 1

## 2013-06-19 MED ORDER — PANTOPRAZOLE SODIUM 40 MG IV SOLR
40.0000 mg | INTRAVENOUS | Status: DC
  Administered 2013-06-20 – 2013-06-24 (×5): 40 mg via INTRAVENOUS
  Filled 2013-06-19 (×6): qty 40

## 2013-06-19 MED ORDER — LEVOFLOXACIN IN D5W 750 MG/150ML IV SOLN
750.0000 mg | Freq: Once | INTRAVENOUS | Status: AC
  Administered 2013-06-20: 750 mg via INTRAVENOUS
  Filled 2013-06-19: qty 150

## 2013-06-19 MED ORDER — MIDAZOLAM HCL 2 MG/2ML IJ SOLN
INTRAMUSCULAR | Status: AC
  Filled 2013-06-19: qty 2

## 2013-06-19 NOTE — ED Provider Notes (Signed)
CSN: [id number]     Arrival date & time 06/19/13  1833 History   First MD Initiated Contact with Patient 06/19/13 1844     Chief Complaint  Patient presents with  . Respiratory Distress   (Consider location/radiation/quality/duration/timing/severity/associated sxs/prior Treatment) Patient is a 67 y.o. female presenting with shortness of breath. The history is provided by the EMS personnel. No language interpreter was used.  Shortness of Breath Severity:  Severe Onset quality:  Sudden Timing:  Constant Progression:  Worsening Chronicity:  Chronic Relieved by:  Nothing Worsened by:  Nothing tried Ineffective treatments:  None tried Associated symptoms: no abdominal pain, no chest pain, no cough, no fever, no headaches, no rash, no sore throat and no vomiting   Risk factors: hx of cancer   Risk factors comment:  Chf   Past Medical History  Diagnosis Date  . Angina   . Heart murmur   . Depressed   . Coronary artery disease 2006    2 stents w/previous MI  . Hypertension   . Migraine   . Anxiety   . Myocardial infarction 2006  . COPD (chronic obstructive pulmonary disease)     oxygen-dependent 4LPM [place]  . Moderate to severe pulmonary hypertension   . Diastolic CHF   . Cancer of breast dx'd 2008    RT Breast   Past Surgical History  Procedure Laterality Date  . Breast lumpectomy      Right  . Cardiac catheterization  2006  . Cholecystectomy    . Abdominal surgery     Family History  Problem Relation Age of Onset  . Schizophrenia Sister    History  Substance Use Topics  . Smoking status: Current Every Day Smoker -- 47 years    Types: Cigarettes  . Smokeless tobacco: Never Used  . Alcohol Use: Yes   OB History   Grav Para Term Preterm Abortions TAB SAB Ect Mult Living                 Review of Systems  Constitutional: Negative for fever and unexpected weight change.  HENT: Negative for congestion, sore throat and rhinorrhea.   Respiratory: Positive for  shortness of breath. Negative for cough.   Cardiovascular: Negative for chest pain.  Gastrointestinal: Negative for nausea, vomiting, abdominal pain and diarrhea.  Genitourinary: Negative for dysuria and hematuria.  Skin: Negative for rash.  Neurological: Negative for syncope, light-headedness and headaches.  Psychiatric/Behavioral: Positive for confusion.  All other systems reviewed and are negative.    Allergies  Ceftriaxone  Home Medications   Current Outpatient Rx  Name  Route  Sig  Dispense  Refill  . albuterol (PROVENTIL HFA;VENTOLIN HFA) 108 (90 BASE) MCG/ACT inhaler   Inhalation   Inhale 2 puffs into the lungs every 4 (four) hours as needed. Shortness of breath and wheezing  Patient is using Pro-Air brand         . ALPRAZolam (XANAX) 1 MG tablet   Oral   Take 1 mg by mouth 3 (three) times daily as needed. For anxitey          . anastrozole (ARIMIDEX) 1 MG tablet   Oral   Take 1 mg by mouth daily.           Marland Kitchen aspirin 325 MG tablet   Oral   Take 325 mg by mouth daily.           Marland Kitchen atorvastatin (LIPITOR) 80 MG tablet   Oral   Take 80 mg by  mouth at bedtime.           . butalbital-acetaminophen-caffeine (FIORICET WITH CODEINE) 50-325-40-30 MG per capsule   Oral   Take 1 capsule by mouth at bedtime as needed.         . butalbital-acetaminophen-caffeine (FIORICET, ESGIC) 50-325-40 MG per tablet   Oral   Take 1 tablet by mouth every 4 (four) hours as needed. For headaches          . calcium-vitamin D (OSCAL WITH D) 250-125 MG-UNIT per tablet   Oral   Take 1 tablet by mouth daily.         . diclofenac sodium (VOLTAREN) 1 % GEL   Topical   Apply 1 application topically daily as needed. Hand pain         . fluticasone (FLOVENT HFA) 220 MCG/ACT inhaler   Inhalation   Inhale 2 puffs into the lungs 2 (two) times daily. For shortness of breath   1 Inhaler   3   . EXPIRED: furosemide (LASIX) 40 MG tablet   Oral   Take 1 tablet (40 mg total) by  mouth 2 (two) times daily.   60 tablet   3   . ipratropium (ATROVENT HFA) 17 MCG/ACT inhaler   Inhalation   Inhale 2 puffs into the lungs every 4 (four) hours as needed. For shortness of breath          . ipratropium-albuterol (DUONEB) 0.5-2.5 (3) MG/3ML SOLN   Nebulization   Take 3 mLs by nebulization every 4 (four) hours as needed.         . lactulose (CHRONULAC) 10 GM/15ML solution   Oral   Take 30 g by mouth as needed.           . montelukast (SINGULAIR) 10 MG tablet   Oral   Take 10 mg by mouth at bedtime.           . Multiple Vitamins-Minerals (MULTIVITAMIN WITH MINERALS) tablet   Oral   Take 1 tablet by mouth daily.         . nortriptyline (PAMELOR) 25 MG capsule   Oral   Take 25 mg by mouth at bedtime.           Marland Kitchen omega-3 acid ethyl esters (LOVAZA) 1 G capsule   Oral   Take 1 g by mouth daily.         . ondansetron (ZOFRAN) 8 MG tablet   Oral   Take 1 tablet (8 mg total) by mouth every 12 (twelve) hours as needed.   30 tablet   3   . pantoprazole (PROTONIX) 40 MG tablet   Oral   Take 40 mg by mouth daily.         . potassium chloride SA (K-DUR,KLOR-CON) 20 MEQ tablet   Oral   Take 20 mEq by mouth daily.          BP 108/60  Pulse 99  Resp 20  SpO2 96% Physical Exam  Nursing note and vitals reviewed. Constitutional: She appears well-developed and well-nourished.  HENT:  Head: Normocephalic and atraumatic.  Right Ear: External ear normal.  Left Ear: External ear normal.  Eyes: EOM are normal.  Neck: Normal range of motion. Neck supple. JVD present.  Cardiovascular: Normal rate, regular rhythm and intact distal pulses.  Exam reveals no gallop and no friction rub.   No murmur heard. Pulmonary/Chest: Effort normal. No respiratory distress. She has no wheezes. She has rales (bilaterally). She exhibits no tenderness.  Abdominal: Soft. Bowel sounds are normal. She exhibits no distension. There is no tenderness. There is no rebound.   Musculoskeletal: Normal range of motion. She exhibits no edema and no tenderness.  Lymphadenopathy:    She has no cervical adenopathy.  Neurological: She is alert.  GCS 12  Skin: Skin is warm. No rash noted.  Psychiatric: She has a normal mood and affect. Her behavior is normal.    ED Course  INTUBATION Date/Time: 06/19/2013 10:40 PM Performed by: Fredirick Lathe Authorized by: Fredirick Lathe Consent: Verbal consent obtained. Risks and benefits: risks, benefits and alternatives were discussed Consent given by: patient Patient identity confirmed: verbally with patient, arm band and hospital-assigned identification number Indications: airway protection Intubation method: video-assisted Preoxygenation: nonrebreather mask (nasal canula) Sedatives: etomidate Tube size: 7.5 mm Tube type: cuffed Number of attempts: 1 Cords visualized: yes Post-procedure assessment: chest rise and ETCO2 monitor Breath sounds: equal Cuff inflated: yes ETT to lip: 22 cm Tube secured with: ETT holder Chest x-ray findings: endotracheal tube in appropriate position Patient tolerance: Patient tolerated the procedure well with no immediate complications.   (including critical care time) Labs Review Labs Reviewed  GLUCOSE, CAPILLARY - Abnormal; Notable for the following:    Glucose-Capillary 63 (*)    All other components within normal limits  CULTURE, BLOOD (ROUTINE X 2)  CULTURE, BLOOD (ROUTINE X 2)  CBC WITH DIFFERENTIAL  COMPREHENSIVE METABOLIC PANEL  PRO B NATRIURETIC PEPTIDE  TROPONIN I  URINALYSIS, ROUTINE W REFLEX MICROSCOPIC  URINE RAPID DRUG SCREEN (HOSP PERFORMED)  BLOOD GAS, ARTERIAL  LACTIC ACID, PLASMA   Imaging Review Ct Head Wo Contrast  06/19/2013   CLINICAL DATA:  Unresponsive patient. Difficulty breathing. Cyanosis.  EXAM: CT HEAD WITHOUT CONTRAST  TECHNIQUE: Contiguous axial images were obtained from the base of the skull through the vertex without intravenous  contrast.  COMPARISON:  07/31/2011  FINDINGS: The brainstem, cerebellum, cerebral peduncles, thalamus, basal ganglia, basilar cisterns, and ventricular system appear within normal limits. No intracranial hemorrhage, mass lesion, or acute CVA.  IMPRESSION: No significant abnormality identified.   Electronically Signed   By: Herbie Baltimore   On: 06/19/2013 19:50   Dg Chest Portable 1 View  06/19/2013   CLINICAL DATA:  Endotracheal tube placement.  EXAM: PORTABLE CHEST - 1 VIEW  COMPARISON:  Earlier the same date. CT 01/31/2012.  FINDINGS: 2246 hr. Interval intubation. The tip of the endotracheal tube is approximately 3.1 cm above the carina. Cardiomegaly, vascular congestion and generalized interstitial prominence are unchanged. There is no airspace disease or significant pleural effusion. Surgical clips are present within the right axilla.  IMPRESSION: Satisfactorily positioned endotracheal tube. No new findings identified.   Electronically Signed   By: Roxy Horseman   On: 06/19/2013 22:59   Dg Chest Port 1 View  06/19/2013   CLINICAL DATA:  Respiratory distress. Current history of breast cancer for which the patient has undergone lumpectomy and radiation therapy.  EXAM: PORTABLE CHEST - 1 VIEW  COMPARISON:  CT chest 01/31/2012. Portable chest x-ray 11/10/2011 and 06/03/2011.  FINDINGS: Cardiac silhouette mildly enlarged but stable. Thoracic aorta mildly atherosclerotic, unchanged. Prominent central pulmonary arteries, similar to the prior examinations. Mild diffuse interstitial lung disease, present on the prior examinations and unchanged. Scarring in the right lung apex and scar/bronchiectasis in the right middle lobe as noted on the prior CT. No new pulmonary parenchymal abnormalities.  IMPRESSION: Stable mild chronic interstitial lung disease and the scarring in the right lung apex and right  middle lobe. No acute cardiopulmonary disease.   Electronically Signed   By: Hulan Saas   On: 06/19/2013 19:16     MDM  No diagnosis found. 6:49 PM Pt is a 67 y.o. female with pertinent PMHX of CAD, COPD, dCHF EF 55-60% in 2013, breast cancer, HTN who presents to the ED with respiratory distress. Upon arrival per EMs pt non-verbal decreased respiratory drive and minimally responsive. GCS initially 12. Given AMS: blood sugar of 63. D50 given for BS 63. Lung sounds concerning for overload and with JVD. Pt placed on NRB maintaining sats 96%. With AMS will trial 0.4 of Narcan  Improvement after 0.4 mg Narcan. Pt much more awake and alert. Pt denies chest pain. Shortness of breath today.  Collateral per daughter: No overdose. No fevers or recent illness. Denies nausea, vomiting or diarrhea. Denies chest pain. Nearing anniversary of husband's death.  Pharmacy discovered pt has had filled 3 separate scripts for Fiorcet in the past month concern for tylenol overdose  Pt placed on Bipap.  Re-eval at 8:30 PM mental status improving, sats improved. Will obtain ABG  CXR AP portable for acute respiratory distress showed vascular congestion  CT head negative for acute intracranial abnormality  Review of Labs: CBC no leukocytosis, H&H 15.1/46.1 CMP: showed hyperkalemia to 6.1 on repeat, acutely elevated LFTS concerning for possible tylenol ingestion Tylenol level <15 UA: negative for UTI UDS positive for opiates and benzos and barbituates Troponin: <0.3 BNP 5267.0 Lactic acid 3.4  Initial blood gas 7.257/73.8/93/32.9. AFter BiPAP no improvement on ABG 7.264/80.9/128.0/36.6 Given worsening ABG will consult critical care for admission to ICU. Pt symptomatically feeling better and much more awake and alert. Repeat lactic acid 1.1. Repeat troponin elevated.   CXR AP portable per my read for post intubation showed satisfactory ET tube position  Recomendation per critical care, pt had episode of vomiting on bipap, elective intubation and plan for admission to ICU. Intubation as above. Pt intubated without  complication placed on versed and fentanyl drip. Plan for admission to ICU for Acute respiratory failure, acute transaminitis, and hyperkalemia  The patient appears reasonably stabilized for admission considering the current resources, flow, and capabilities available in the ED at this time, and I doubt any other [facility] requiring further screening and/or treatment in the ED prior to admission.   Plan for admission to Critical care for further evaluation and management. PT transferred to ICU VSS.  Labs, EKG and imaging reviewed by myself and considered in medical decision making if ordered.  Imaging interpreted by radiology. Pt was discussed with my attending, Dr. Bjorn Loser, MD 06/20/13 [phone]

## 2013-06-19 NOTE — Progress Notes (Signed)
Pt transported to and from CT on BI-PAP without incident.

## 2013-06-19 NOTE — H&P (Signed)
PULMONARY  / CRITICAL CARE MEDICINE  Name: [name] MRN: [id number] DOB: [birth year]    ADMISSION DATE:  06/19/2013 CONSULTATION DATE:  06/19/2013  REFERRING MD :  EDP PRIMARY SERVICE:  PCCM  CHIEF COMPLAINT:  Altered mental status  BRIEF PATIENT DESCRIPTION: 67 yo with oxygen-dependent COPD, pulmonary hypertension, diastolic CHF, on Xanax, Fioricet, Codeine and Nortriptyline found at home unresponsive.  Upon arrival to ED ABG revealed respiratory acidosis. Tox screen positive for barbiturates, opioids and benzodiazepines. Placed on BiPAP and given narcan with some improvement of mental status, however hypercarbia worsened and emesis developed.  Patient intubated.   SIGNIFICANT EVENTS / STUDIES:   LINES / TUBES: OETT 9/23 >>> OGT 9/23 >>> Foley 9/23 >>>  CULTURES: 9/23 Blood >>> 9/23 Respiratory >>>  ANTIBIOTICS: Levaquin 9/23 >>>  The patient is encephalopathic and unable to provide history, which was obtained for available medical records.  HISTORY OF PRESENT ILLNESS:  67 yo with oxygen-dependent COPD, pulmonary hypertension, diastolic CHF, on Xanax, Fioricet, Codeine and Nortriptyline found at home unresponsive.  Upon arrival to ED ABG revealed respiratory acidosis. Placed on BiPAP and given narcan with some improvement of mental status, however hypercarbia worsened and emesis developed.  Patient intubated.  PAST MEDICAL HISTORY :  Past Medical History  Diagnosis Date  . Angina   . Heart murmur   . Depressed   . Coronary artery disease 2006    2 stents w/previous MI  . Hypertension   . Migraine   . Anxiety   . Myocardial infarction 2006  . COPD (chronic obstructive pulmonary disease)     oxygen-dependent 4LPM [place]  . Moderate to severe pulmonary hypertension   . Diastolic CHF   . Cancer of breast dx'd 2008    RT Breast   Past Surgical History  Procedure Laterality Date  . Breast lumpectomy      Right  . Cardiac catheterization  2006  . Cholecystectomy    .  Abdominal surgery     Prior to Admission medications   Medication Sig Start Date End Date Taking? Authorizing Provider  albuterol (PROVENTIL HFA;VENTOLIN HFA) 108 (90 BASE) MCG/ACT inhaler Inhale 2 puffs into the lungs every 4 (four) hours as needed. Shortness of breath and wheezing  Patient is using Pro-Air brand    Historical Provider, MD  ALPRAZolam Prudy Feeler) 1 MG tablet Take 1 mg by mouth 3 (three) times daily as needed. For anxitey     Historical Provider, MD  anastrozole (ARIMIDEX) 1 MG tablet Take 1 mg by mouth daily.      Historical Provider, MD  aspirin 325 MG tablet Take 325 mg by mouth daily.      Historical Provider, MD  atorvastatin (LIPITOR) 80 MG tablet Take 80 mg by mouth at bedtime.      Historical Provider, MD  butalbital-acetaminophen-caffeine (FIORICET WITH CODEINE) 50-325-40-30 MG per capsule Take 1 capsule by mouth at bedtime as needed.    Historical Provider, MD  butalbital-acetaminophen-caffeine (FIORICET, ESGIC) 50-325-40 MG per tablet Take 1 tablet by mouth every 4 (four) hours as needed. For headaches     Historical Provider, MD  calcium-vitamin D (OSCAL WITH D) 250-125 MG-UNIT per tablet Take 1 tablet by mouth daily.    Historical Provider, MD  diclofenac sodium (VOLTAREN) 1 % GEL Apply 1 application topically daily as needed. Hand pain    Historical Provider, MD  fluticasone (FLOVENT HFA) 220 MCG/ACT inhaler Inhale 2 puffs into the lungs 2 (two) times daily. For shortness of breath 11/14/11  Ripudeep Jenna Luo, MD  furosemide (LASIX) 40 MG tablet Take 1 tablet (40 mg total) by mouth 2 (two) times daily. 11/14/11 11/13/12  Ripudeep Jenna Luo, MD  ipratropium (ATROVENT HFA) 17 MCG/ACT inhaler Inhale 2 puffs into the lungs every 4 (four) hours as needed. For shortness of breath     Historical Provider, MD  ipratropium-albuterol (DUONEB) 0.5-2.5 (3) MG/3ML SOLN Take 3 mLs by nebulization every 4 (four) hours as needed.    Historical Provider, MD  lactulose (CHRONULAC) 10 GM/15ML  solution Take 30 g by mouth as needed.      Historical Provider, MD  montelukast (SINGULAIR) 10 MG tablet Take 10 mg by mouth at bedtime.      Historical Provider, MD  Multiple Vitamins-Minerals (MULTIVITAMIN WITH MINERALS) tablet Take 1 tablet by mouth daily.    Historical Provider, MD  nortriptyline (PAMELOR) 25 MG capsule Take 25 mg by mouth at bedtime.      Historical Provider, MD  omega-3 acid ethyl esters (LOVAZA) 1 G capsule Take 1 g by mouth daily.    Historical Provider, MD  ondansetron (ZOFRAN) 8 MG tablet Take 1 tablet (8 mg total) by mouth every 12 (twelve) hours as needed. 01/01/13   Melissa D Cross, NP  pantoprazole (PROTONIX) 40 MG tablet Take 40 mg by mouth daily.    Historical Provider, MD  potassium chloride SA (K-DUR,KLOR-CON) 20 MEQ tablet Take 20 mEq by mouth daily.    Historical Provider, MD   Allergies  Allergen Reactions  . Ceftriaxone Other (See Comments)    "Blow up like a balloon"   FAMILY HISTORY:  Family History  Problem Relation Age of Onset  . Schizophrenia Sister    SOCIAL HISTORY:  reports that she has been smoking Cigarettes.  She has been smoking about 0.00 packs per day for the past 47 years. She has never used smokeless tobacco. She reports that  drinks alcohol. She reports that she does not use illicit drugs.  REVIEW OF SYSTEMS:  Unable to provide.  INTERVAL HISTORY:  VITAL SIGNS: Temp:  [98.5 F (36.9 C)] 98.5 F (36.9 C) (09/23 1855) Pulse Rate:  [90-102] 92 (09/23 2200) Resp:  [14-27] 23 (09/23 2200) BP: (61-114)/(24-91) 100/65 mmHg (09/23 2200) SpO2:  [94 %-100 %] 96 % (09/23 2200) FiO2 (%):  [40 %-60 %] 40 % (09/23 2101) Weight:  [54 kg (119 lb 0.8 oz)] 54 kg (119 lb 0.8 oz) (09/23 2200)  HEMODYNAMICS:   VENTILATOR SETTINGS: Vent Mode:  [-] BIPAP FiO2 (%):  [40 %-60 %] 40 % Set Rate:  [12 bmp] 12 bmp INTAKE / OUTPUT: Intake/Output   None    PHYSICAL EXAMINATION: General:  Appears acutely ill, mechanically ventilated,  synchronous Neuro:  Encephalopathic, nonfocal, cough / gag diminished HEENT:  PERRL, OETT / OGT Cardiovascular:  RRR, no m/r/g Lungs:  Bilateral diminished air entry with wheezing, R lumpectomy scar Abdomen:  Soft, nontender, bowel sounds diminished Musculoskeletal:  Moves all extremities, no edema Skin:  Intact  LABS:  Recent Labs Lab 06/19/13 1846  06/19/13 1900 06/19/13 1901 06/19/13 2026 06/19/13 2046 06/19/13 2148 06/19/13 2213  HGB  --   --  15.1*  --   --   --   --   --   WBC  --   --  7.8  --   --   --   --   --   PLT  --   --  234  --   --   --   --   --  NA  --   --  138  --   --   --  140  --   K  --   < > 7.4*  --  5.8*  --  6.1*  --   CL  --   --  98  --   --   --  99  --   CO2  --   --  28  --   --   --  31  --   GLUCOSE  --   --  74  --   --   --  98  --   BUN  --   --  16  --   --   --  19  --   CREATININE  --   --  1.50*  --   --   --  1.44*  --   CALCIUM  --   --  9.2  --   --   --  8.9  --   AST  --   --  1163*  --   --   --  4544*  --   ALT  --   --  1128*  --   --   --  4140*  --   ALKPHOS  --   --  261*  --   --   --  239*  --   BILITOT  --   --  0.5  --   --   --  0.3  --   PROT  --   --  8.1  --   --   --  7.4  --   ALBUMIN  --   --  3.4*  --   --   --  3.2*  --   LATICACIDVEN  --   --   --  3.4*  --   --   --  1.1  TROPONINI  --   --  <0.30  --   --   --   --   --   PROBNP  --   --  5267.0*  --   --   --   --   --   PHART 7.257*  --   --   --   --  7.264*  --   --   PCO2ART 73.8*  --   --   --   --  80.9*  --   --   PO2ART 93.0  --   --   --   --  128.0*  --   --   < > = values in this interval not displayed.  Recent Labs Lab 06/19/13 1835 06/19/13 1857  GLUCAP 63* 130*   CXR: 9/23 >>> Hardware in good position, no overt airspace disease  ASSESSMENT / PLAN:  PULMONARY A:  Acute respiratory failure in setting of suspected polysubstance overdose, possible acute COPD exacerbation and possible aspiration.  Pulmonary hypertension (PAP 76  torr Feb 2013). P:   Gaol SpO2>92, pH>7.30 Full mechanical support Daily SBT Trend ABG / CXR Albuterol / Atrovent Solu-Medrol 80 q8h Flovent when able  CARDIOVASCULAR A: Diastolic CHF, doubt exacerbation would lead to hypercarbic failure. CAD. P:  Goal MAP>60 Trend troponin / lactate TTE Continue ASA Hold Lipitor as elevated transaminases  RENAL A:  AKI.  Hyperkalemia. P:   Goal CVP>10 Trend BMP, next 2 AM NS@KVO  Hold Lasix Ca / insulin / D50 / Kayexalate  GASTROINTESTINAL A:  Acute liver failure. Highly suspect Tylenol toxicity (  according to Pharmacy filled multiple prescriptions for Fioricet from multiple providers for the total of 250 tabs over last 10 days). Possible liver congestion in setting of severe pulmonary hypertension. P:   NPO as intubated TF if remains intubated > 24 hours Protonix for GI Px Trend LFT, next 2 AM Tylenol level now and 2 AM Avoid hepatotoxic agents Empirical acetylcysteine IV  HEMATOLOGIC A:  No active issues. P:  Trend CBC SCDs for DVT Px APTT / INR  INFECTIOUS A:  No overt infection. P:   Empirical flouroquinolone for AECOPD as above PCT  ENDOCRINE  A:  Hypoglycemia in setting of liver dysfunction. P:   CBG q4h  NEUROLOGIC A:  Acute encephalopathy. P:   Goal RASS 0 to -1 Hold Xanax, Fioricet, Nortriptyline Fentanyl / Versed gtt Daily WUA  I have personally obtained history, examined patient, evaluated and interpreted laboratory and imaging results, reviewed medical records, formulated assessment / plan and placed orders.  CRITICAL CARE:  The patient is critically ill with multiple organ systems failure and requires high complexity decision making for assessment and support, frequent evaluation and titration of therapies, application of advanced monitoring technologies and extensive interpretation of multiple databases. Critical Care Time devoted to patient care services described in this note is 90 minutes.    Lonia Farber, MD Pulmonary and Critical Care Medicine [facility] Pager: [phone]  06/19/2013, 10:50 PM

## 2013-06-19 NOTE — ED Notes (Signed)
Pt remains on Bi Pap and tolerating it well at this time. Pt's daughter called to check on the status of her mom.  St's she is unable to be here due to small children.   [name]  [phone]

## 2013-06-19 NOTE — ED Notes (Signed)
Checked patient cbg it was 52 notified Dr. Lauro Regulus of blood sugar

## 2013-06-19 NOTE — ED Notes (Signed)
Patient presents to ed resp distress, patient was at home , family called ems patient was unresponsive and having difficulty breathing. Upon arrival to ed patient was being bagged per ems. Patient was non- verbal. Pupils 2-3 bilaterally and reactive positive JVD. Lungs with crackles bilaterally cyanosis to the lips upon ems arrival. GCS 12 Dr. Rhunette Croft  At bedside.

## 2013-06-19 NOTE — ED Notes (Signed)
Pt continues to be responsive and answering questions correctly.  Pt remains on BiPAP.

## 2013-06-19 NOTE — ED Notes (Signed)
Pt vomited sm amount, bi pap removed and pt placed on 100% NRB mask.  Dr. Herma Carson in to talk to pt about intubation and pt agrees.

## 2013-06-19 NOTE — ED Notes (Signed)
cbg 138 

## 2013-06-19 NOTE — ED Notes (Signed)
Patient much more alert taking and answering questions appropriately. Talking with doctor

## 2013-06-19 NOTE — ED Notes (Signed)
One gold colored chain necklace with charm, 3 pair gold colored hoop earrings removed and place in container with pt label attached.

## 2013-06-20 ENCOUNTER — Inpatient Hospital Stay (HOSPITAL_COMMUNITY): Payer: Medicare Other

## 2013-06-20 LAB — COMPREHENSIVE METABOLIC PANEL
ALT: 4726 U/L — ABNORMAL HIGH (ref 0–35)
AST: 6758 U/L — ABNORMAL HIGH (ref 0–37)
Albumin: 2.5 g/dL — ABNORMAL LOW (ref 3.5–5.2)
Alkaline Phosphatase: 188 U/L — ABNORMAL HIGH (ref 39–117)
BUN: 23 mg/dL (ref 6–23)
CO2: 30 mEq/L (ref 19–32)
Calcium: 8.5 mg/dL (ref 8.4–10.5)
Chloride: 101 mEq/L (ref 96–112)
Creatinine, Ser: 1.34 mg/dL — ABNORMAL HIGH (ref 0.50–1.10)
GFR calc Af Amer: 46 mL/min — ABNORMAL LOW (ref >90)
GFR calc non Af Amer: 40 mL/min — ABNORMAL LOW (ref >90)
Glucose, Bld: 147 mg/dL — ABNORMAL HIGH (ref 70–99)
Potassium: 4.7 mEq/L (ref 3.5–5.1)
Sodium: 144 mEq/L (ref 135–145)
Total Bilirubin: 0.2 mg/dL — ABNORMAL LOW (ref 0.3–1.2)
Total Protein: 6.2 g/dL (ref 6.0–8.3)

## 2013-06-20 LAB — URINALYSIS, ROUTINE W REFLEX MICROSCOPIC
Glucose, UA: 100 mg/dL — AB
Ketones, ur: 15 mg/dL — AB
Nitrite: POSITIVE — AB
Protein, ur: >300 mg/dL — AB
Specific Gravity, Urine: >1.03 — ABNORMAL HIGH (ref 1.005–1.030)
Urobilinogen, UA: 1 mg/dL (ref 0.0–1.0)
pH: 5 (ref 5.0–8.0)

## 2013-06-20 LAB — GLUCOSE, CAPILLARY
Glucose-Capillary: 113 mg/dL — ABNORMAL HIGH (ref 70–99)
Glucose-Capillary: 119 mg/dL — ABNORMAL HIGH (ref 70–99)
Glucose-Capillary: 119 mg/dL — ABNORMAL HIGH (ref 70–99)
Glucose-Capillary: 150 mg/dL — ABNORMAL HIGH (ref 70–99)
Glucose-Capillary: 94 mg/dL (ref 70–99)

## 2013-06-20 LAB — MRSA PCR SCREENING: MRSA by PCR: NEGATIVE

## 2013-06-20 LAB — CBC
HCT: 36.7 % (ref 36.0–46.0)
Hemoglobin: 11.9 g/dL — ABNORMAL LOW (ref 12.0–15.0)
MCH: 32.1 pg (ref 26.0–34.0)
MCHC: 32.4 g/dL (ref 30.0–36.0)
MCV: 98.9 fL (ref 78.0–100.0)
Platelets: 172 10*3/uL (ref 150–400)
RBC: 3.71 MIL/uL — ABNORMAL LOW (ref 3.87–5.11)
RDW: 13.5 % (ref 11.5–15.5)
WBC: 10 10*3/uL (ref 4.0–10.5)

## 2013-06-20 LAB — BASIC METABOLIC PANEL
BUN: 26 mg/dL — ABNORMAL HIGH (ref 6–23)
CO2: 30 mEq/L (ref 19–32)
Calcium: 9 mg/dL (ref 8.4–10.5)
Chloride: 97 mEq/L (ref 96–112)
Creatinine, Ser: 1.64 mg/dL — ABNORMAL HIGH (ref 0.50–1.10)
GFR calc Af Amer: 36 mL/min — ABNORMAL LOW (ref >90)
GFR calc non Af Amer: 31 mL/min — ABNORMAL LOW (ref >90)
Glucose, Bld: 119 mg/dL — ABNORMAL HIGH (ref 70–99)
Potassium: 4.9 mEq/L (ref 3.5–5.1)
Sodium: 138 mEq/L (ref 135–145)

## 2013-06-20 LAB — URINE MICROSCOPIC-ADD ON

## 2013-06-20 LAB — TROPONIN I
Troponin I: 0.47 ng/mL (ref <0.30)
Troponin I: 0.4 ng/mL (ref <0.30)
Troponin I: 0.46 ng/mL (ref <0.30)
Troponin I: 0.38 ng/mL (ref <0.30)
Troponin I: <0.3 ng/mL (ref <0.30)

## 2013-06-20 LAB — PROCALCITONIN: Procalcitonin: 0.98 ng/mL

## 2013-06-20 LAB — PROTIME-INR
INR: 1.6 — ABNORMAL HIGH (ref 0.00–1.49)
Prothrombin Time: 18.6 seconds — ABNORMAL HIGH (ref 11.6–15.2)

## 2013-06-20 LAB — SALICYLATE LEVEL: Salicylate Lvl: <2 mg/dL — ABNORMAL LOW (ref 2.8–20.0)

## 2013-06-20 LAB — LACTIC ACID, PLASMA
Lactic Acid, Venous: 1.7 mmol/L (ref 0.5–2.2)
Lactic Acid, Venous: 1.1 mmol/L (ref 0.5–2.2)
Lactic Acid, Venous: 1.1 mmol/L (ref 0.5–2.2)

## 2013-06-20 LAB — ACETAMINOPHEN LEVEL: Acetaminophen (Tylenol), Serum: <15 ug/mL (ref 10–30)

## 2013-06-20 LAB — AMMONIA
Ammonia: 91 umol/L — ABNORMAL HIGH (ref 11–60)
Ammonia: 47 umol/L (ref 11–60)

## 2013-06-20 MED ORDER — SODIUM CHLORIDE 0.9 % IV BOLUS (SEPSIS)
500.0000 mL | Freq: Once | INTRAVENOUS | Status: AC
  Administered 2013-06-20: 500 mL via INTRAVENOUS

## 2013-06-20 MED ORDER — ALBUTEROL SULFATE (5 MG/ML) 0.5% IN NEBU
2.5000 mg | INHALATION_SOLUTION | RESPIRATORY_TRACT | Status: DC
  Administered 2013-06-20 – 2013-06-25 (×32): 2.5 mg via RESPIRATORY_TRACT
  Filled 2013-06-20 (×33): qty 0.5

## 2013-06-20 MED ORDER — LEVOFLOXACIN IN D5W 500 MG/100ML IV SOLN
500.0000 mg | INTRAVENOUS | Status: DC
  Administered 2013-06-22: 500 mg via INTRAVENOUS
  Filled 2013-06-20: qty 100

## 2013-06-20 MED ORDER — FUROSEMIDE 10 MG/ML IJ SOLN
20.0000 mg | Freq: Once | INTRAMUSCULAR | Status: AC
  Administered 2013-06-20: 20 mg via INTRAVENOUS
  Filled 2013-06-20: qty 2

## 2013-06-20 MED ORDER — VITAL AF 1.2 CAL PO LIQD
1000.0000 mL | ORAL | Status: DC
  Administered 2013-06-20: 1000 mL
  Filled 2013-06-20 (×3): qty 1000

## 2013-06-20 MED ORDER — IPRATROPIUM BROMIDE 0.02 % IN SOLN
0.5000 mg | RESPIRATORY_TRACT | Status: DC
  Administered 2013-06-20 – 2013-06-25 (×32): 0.5 mg via RESPIRATORY_TRACT
  Filled 2013-06-20 (×33): qty 2.5

## 2013-06-20 MED ORDER — CHLORHEXIDINE GLUCONATE 0.12 % MT SOLN
15.0000 mL | Freq: Two times a day (BID) | OROMUCOSAL | Status: DC
  Administered 2013-06-20 – 2013-06-21 (×4): 15 mL via OROMUCOSAL
  Filled 2013-06-20 (×4): qty 15

## 2013-06-20 MED ORDER — DEXMEDETOMIDINE HCL IN NACL 200 MCG/50ML IV SOLN
0.2000 ug/kg/h | INTRAVENOUS | Status: DC
  Administered 2013-06-20 (×2): 0.2 ug/kg/h via INTRAVENOUS
  Administered 2013-06-21: 0.6 ug/kg/h via INTRAVENOUS
  Filled 2013-06-20 (×3): qty 50

## 2013-06-20 NOTE — Progress Notes (Signed)
  Echocardiogram 2D Echocardiogram has been performed.  [name] 06/20/2013, 5:54 PM

## 2013-06-20 NOTE — Progress Notes (Signed)
Chaplain responded to referral from physician during board rounds. Patient intubated but alert and oriented, able to communicate with me and daughter, even laughing. Daughter was by bedside. Both were receptive and appreciative of visit. Patient is Catholic and requested that her priest be contacted. Chaplain will make this contact. Chaplain provided prayer upon patient's request.  Maurene Capes, Chaplain

## 2013-06-20 NOTE — Progress Notes (Signed)
ANTIBIOTIC CONSULT NOTE - INITIAL  Pharmacy Consult for Levaquin Indication: rule out pneumonia  Allergies  Allergen Reactions  . Ceftriaxone Other (See Comments)    "Blow up like a balloon"    Patient Measurements: Height: 5' 1.81" (157 cm) Weight: 119 lb 0.8 oz (54 kg) IBW/kg (Calculated) : 49.67  Vital Signs: Temp: 98.5 F (36.9 C) (09/23 1855) Temp src: Rectal (09/23 1855) BP: 104/52 mmHg (09/24 0000) Pulse Rate: 86 (09/24 0000)  Labs:  Recent Labs  06/19/13 1900 06/19/13 2148  WBC 7.8  --   HGB 15.1*  --   PLT 234  --   CREATININE 1.50* 1.44*     Estimated Creatinine Clearance: 29.7 ml/min (by C-G formula based on Cr of 1.44). No results found for this basename: VANCOTROUGH, VANCOPEAK, VANCORANDOM, GENTTROUGH, GENTPEAK, GENTRANDOM, TOBRATROUGH, TOBRAPEAK, TOBRARND, AMIKACINPEAK, AMIKACINTROU, AMIKACIN,  in the last 72 hours   Microbiology: No results found for this or any previous visit (from the past 720 hour(s)).  Medical History: Past Medical History  Diagnosis Date  . Angina   . Heart murmur   . Depressed   . Coronary artery disease 2006    2 stents w/previous MI  . Hypertension   . Migraine   . Anxiety   . Myocardial infarction 2006  . COPD (chronic obstructive pulmonary disease)     oxygen-dependent 4LPM [place]  . Moderate to severe pulmonary hypertension   . Diastolic CHF   . Cancer of breast dx'd 2008    RT Breast   Assessment: 67 yo female with AMS, VDRF for empiric antibiotics. H&P from [facility] states pt is allergic to Levaquin, though no documentation of allergy in EPIC.  Pt previously received Levaquin 01/2012.  Discussed with Dr Herma Carson and will continue Levaquin as ordered. Concern for possible Fioricet overdose and Acetadote ordered.  INR 1.43  AST/ALT very elevated, though APAP level < 15   Plan:  Levaquin 750 mg IV now, then 500 mg IV q48h Acetadote 150 mg/kg IV now, then 150 mg/kg IV x24 hrs. F/U LFTs  Eddie Candle 06/20/2013,12:15 AM

## 2013-06-20 NOTE — Progress Notes (Addendum)
PULMONARY  / CRITICAL CARE MEDICINE  Name: [name] MRN: [id number] DOB: 30-May-1946    ADMISSION DATE:  06/19/2013 CONSULTATION DATE:  06/19/2013  REFERRING MD :  EDP PRIMARY SERVICE:  PCCM  CHIEF COMPLAINT:  Altered mental status  BRIEF PATIENT DESCRIPTION: 67 yo with oxygen-dependent COPD, pulmonary hypertension, diastolic CHF, on Xanax, Fioricet, Codeine and Nortriptyline found at home unresponsive.  Upon arrival to ED ABG revealed respiratory acidosis. Tox screen positive for barbiturates, opioids and benzodiazepines. Placed on BiPAP and given narcan with some improvement of mental status, however hypercarbia worsened and emesis developed.  Patient intubated.   :  LINES / TUBES: OETT 9/23 >>> OGT 9/23 >>> Foley 9/23 >>>   CULTURES: 9/23 Blood >>> 9/23 Respiratory >>>     ANTIBIOTICS: Levaquin 9/23 >>>  SIG NIFICANT EVENTS / STUDIES 06/19/2013 >> ADMIT. Urint tox positive for barb, benzoe and opioids. Tylenol level < 15 06/20/13: Tylenol level < 15   INTERVAL HISTORY: 06/20/13: WUA on sedation fent/versed : Normal WUA. Admits she took excessive tylenol for pain but did not ingest huge amounts NOS. Denies suicidal ideation. Failed SBT while on sedtion. Wheezing +. High LFTs  VITAL SIGNS: Temp:  [98 F (36.7 C)-100.1 F (37.8 C)] 98 F (36.7 C) (09/24 0817) Pulse Rate:  [73-102] 95 (09/24 0858) Resp:  [0-31] 23 (09/24 0858) BP: (61-129)/(24-91) 117/74 mmHg (09/24 0858) SpO2:  [87 %-100 %] 98 % (09/24 0858) FiO2 (%):  [40 %-100 %] 40 % (09/24 0858) Weight:  [52.7 kg (116 lb 2.9 oz)-54 kg (119 lb 0.8 oz)] 52.7 kg (116 lb 2.9 oz) (09/24 0100)  HEMODYNAMICS:   VENTILATOR SETTINGS: Vent Mode:  [-] PRVC FiO2 (%):  [40 %-100 %] 40 % Set Rate:  [12 bmp-20 bmp] 20 bmp Vt Set:  [450 mL] 450 mL PEEP:  [5 cmH20] 5 cmH20 Plateau Pressure:  [19 cmH20-29 cmH20] 23 cmH20 INTAKE / OUTPUT: Intake/Output     09/23 0701 - 09/24 0700 09/24 0701 - 09/25 0700   I.V. (mL/kg)  420.9 (8) 32.3 (0.6)   NG/GT 30    Total Intake(mL/kg) 450.9 (8.6) 32.3 (0.6)   Urine (mL/kg/hr) 330 30 (0.2)   Total Output 330 30   Net +120.9 +2.3         PHYSICAL EXAMINATION: General:  Appears acutely ill, mechanically ventilated, synchronous Neuro:  Encephalopathic, nonfocal, cough / gag diminished HEENT:  PERRL, OETT / OGT Cardiovascular:  RRR, no m/r/g Lungs:  Bilateral diminished air entry with wheezing, R lumpectomy scar Abdomen:  Soft, nontender, bowel sounds diminished Musculoskeletal:  Moves all extremities, no edema Skin:  Intact  LABS: PULMONARY  Recent Labs Lab 06/19/13 1846 06/19/13 2046  PHART 7.257* 7.264*  PCO2ART 73.8* 80.9*  PO2ART 93.0 128.0*  HCO3 32.9* 36.6*  TCO2 35 39  O2SAT 95.0 98.0    CBC  Recent Labs Lab 06/19/13 1900 06/20/13 0501  HGB 15.1* 11.9*  HCT 46.1* 36.7  WBC 7.8 10.0  PLT 234 172    COAGULATION  Recent Labs Lab 06/19/13 2318  INR 1.43    CARDIAC   Recent Labs Lab 06/19/13 1900 06/19/13 2213 06/19/13 2318 06/20/13 0501  TROPONINI <0.30 0.42* 0.47* 0.40*    Recent Labs Lab 06/19/13 1900  PROBNP 5267.0*     CHEMISTRY  Recent Labs Lab 06/19/13 1900  06/19/13 2148 06/20/13 0305 06/20/13 0501  NA 138  --  140 144 138  K 7.4*  < > 6.1* 4.7 4.9  CL 98  --  99 101 97  CO2 28  --  31 30 30   GLUCOSE 74  --  98 147* 119*  BUN 16  --  19 23 26*  CREATININE 1.50*  --  1.44* 1.34* 1.64*  CALCIUM 9.2  --  8.9 8.5 9.0  < > = values in this interval not displayed. Estimated Creatinine Clearance: 26.1 ml/min (by C-G formula based on Cr of 1.64).   LIVER  Recent Labs Lab 06/19/13 1900 06/19/13 2148 06/19/13 2318 06/20/13 0305  AST 1163* 4544*  --  6758*  ALT 1128* 4140*  --  4726*  ALKPHOS 261* 239*  --  188*  BILITOT 0.5 0.3  --  0.2*  PROT 8.1 7.4  --  6.2  ALBUMIN 3.4* 3.2*  --  2.5*  INR  --   --  1.43  --      INFECTIOUS  Recent Labs Lab 06/19/13 2213 06/19/13 2318  06/20/13 0501  LATICACIDVEN 1.1 1.7 1.1     ENDOCRINE CBG (last 3)   Recent Labs  06/20/13 0044 06/20/13 0409 06/20/13 0814  GLUCAP 94 113* 119*         IMAGING x48h  Ct Head Wo Contrast  06/19/2013   CLINICAL DATA:  Unresponsive patient. Difficulty breathing. Cyanosis.  EXAM: CT HEAD WITHOUT CONTRAST  TECHNIQUE: Contiguous axial images were obtained from the base of the skull through the vertex without intravenous contrast.  COMPARISON:  07/31/2011  FINDINGS: The brainstem, cerebellum, cerebral peduncles, thalamus, basal ganglia, basilar cisterns, and ventricular system appear within normal limits. No intracranial hemorrhage, mass lesion, or acute CVA.  IMPRESSION: No significant abnormality identified.   Electronically Signed   By: Herbie Baltimore   On: 06/19/2013 19:50   Dg Chest Portable 1 View  06/19/2013   CLINICAL DATA:  Endotracheal tube placement.  EXAM: PORTABLE CHEST - 1 VIEW  COMPARISON:  Earlier the same date. CT 01/31/2012.  FINDINGS: 2246 hr. Interval intubation. The tip of the endotracheal tube is approximately 3.1 cm above the carina. Cardiomegaly, vascular congestion and generalized interstitial prominence are unchanged. There is no airspace disease or significant pleural effusion. Surgical clips are present within the right axilla.  IMPRESSION: Satisfactorily positioned endotracheal tube. No new findings identified.   Electronically Signed   By: Roxy Horseman   On: 06/19/2013 22:59   Dg Chest Port 1 View  06/19/2013   CLINICAL DATA:  Respiratory distress. Current history of breast cancer for which the patient has undergone lumpectomy and radiation therapy.  EXAM: PORTABLE CHEST - 1 VIEW  COMPARISON:  CT chest 01/31/2012. Portable chest x-ray 11/10/2011 and 06/03/2011.  FINDINGS: Cardiac silhouette mildly enlarged but stable. Thoracic aorta mildly atherosclerotic, unchanged. Prominent central pulmonary arteries, similar to the prior examinations. Mild diffuse  interstitial lung disease, present on the prior examinations and unchanged. Scarring in the right lung apex and scar/bronchiectasis in the right middle lobe as noted on the prior CT. No new pulmonary parenchymal abnormalities.  IMPRESSION: Stable mild chronic interstitial lung disease and the scarring in the right lung apex and right middle lobe. No acute cardiopulmonary disease.   Electronically Signed   By: Hulan Saas   On: 06/19/2013 19:16     ASSESSMENT / PLAN:  PULMONARY A:  Acute respiratory failure in setting of suspected polysubstance overdose, possible acute COPD exacerbation and possible aspiration.  Pulmonary hypertension (PAP 76 torr Feb 2013).  06/20/13: Failed SBT due to wheezing and while on sedatin. DTr gives hx of cold and multiple intubation  P:   Check Multiplex resp virus PCR Full mechanical support Daily SBT Trend ABG / CXR Albuterol / Atrovent Solu-Medrol 80 q8h   CARDIOVASCULAR A: Diastolic CHF, doubt exacerbation would lead to hypercarbic failure. CAD. Likely Type 2 NSTEMI due to stress P:  Goal MAP>60 Trend troponin / lactate Continue ASA Hold Lipitor as elevated transaminases STart Lasix x 1 dose Get ECHO x 1 Get ekg Hold IV hepearin till INR status clear over next 24h  RENAL A:  AKI.  Hyperkalemia.  06/20/13: Making urine. K normal P:   Goal CVP>10 Trend BMP, NS@KVO     GASTROINTESTINAL A:  Acute liver failure. Highly suspect Tylenol toxicity (according to Pharmacy filled multiple prescriptions for Fioricet from multiple providers for the total of 250 tabs over last 10 days). Possible liver congestion in setting of severe pulmonary hypertension.  06/20/13: Rising LFT. Admits to xs tyelnol ingestion due to pain. Tylenol leve <15 x 2 but still possible toxicity  P:   NAC per poison control to continue TF if remains  Protonix for GI Px Trend LFT Avoid hepatotoxic agents   HEMATOLOGIC A:  No active issues. P:  Trend CBC Heparn  SQ  for dvt proph   INFECTIOUS A:  No overt infection. P:   Empirical flouroquinolone for AECOPD as above PCT  ENDOCRINE  A:  Hypoglycemia in setting of liver dysfunction. P:   CBG q4h  NEUROLOGIC A:  Acute encephalopathy at admit  06/20/13: Normal WUA on sedatino but gets agitated when sedation gtt weaned  P:   Goal RASS 0 to -1 Hold Xanax, Fioricet, Nortriptyline Fentanyl / Versed gtt STart precedex and wean off versed DC restraint at daughter request Daily WUA   GLOBAL 06/20/13: Daughte Kim updated in detail. Admits xs tylenol for migraine and pain. Give hx of cold resulting in intubation. Gvies hx of multiple intubations       The patient is critically ill with multiple organ systems failure and requires high complexity decision making for assessment and support, frequent evaluation and titration of therapies, application of advanced monitoring technologies and extensive interpretation of multiple databases.   Critical Care Time devoted to patient care services described in this note is  35  Minutes.  Dr. Kalman Shan, M.D., [facility].C.P Pulmonary and Critical Care Medicine Staff Physician [facility] and Critical Care Pager: [phone], If no answer or between  15:00h - 7:00h: call [phone]  06/20/2013 9:21 AM

## 2013-06-20 NOTE — Progress Notes (Signed)
INITIAL NUTRITION ASSESSMENT  DOCUMENTATION CODES Per approved criteria  -Not Applicable   INTERVENTION: 1.  Enteral nutrition; initiate Vital 1.2 @ 20 mL/hr continuous.  Advance by 10 mL q 4 hrs to 45 mL/hr goal to provide 1296 kcal, 81 g protein, and 875 mL free water.  NUTRITION DIAGNOSIS: Inadequate oral intake related to inability to eat as evidenced by NPO, intubated.   Monitor:  1.  Enteral nutrition; initiation with tolerance.  Pt to meet >/=90% estimated needs with nutrition support.  2.  Wt/wt change; monitor trends  Reason for Assessment: vent  67 y.o. female  Admitting Dx: respiratory distress  ASSESSMENT: Pt admitted with AMS and respiratory distress after being found unresponsive at home.  Pt is O2-dependent COPD. Pt with acute liver failure which may be related to recent medication use- tox screen positive for barbituates, opiods, and benzos. Patient is currently intubated on ventilator support.  MV: 8.9 L/min Temp:Temp (24hrs), Avg:98.7 F (37.1 C), Min:98 F (36.7 C), Max:100.1 F (37.8 C)  Pt is awake and alert on vent; able to answer some questions with nods.  Daughter at bedside reports small intake per her usual PTA.  She also drinks Boost at least daily, sometimes twice.   Nutrition Focused Physical Exam:  Subcutaneous Fat:  Orbital Region: WNL Upper Arm Region: WNL Thoracic and Lumbar Region: WNL  Muscle:  Temple Region: WNL Clavicle Bone Region: WNL Clavicle and Acromion Bone Region: WNL Scapular Bone Region: WNL Dorsal Hand: WNL Patellar Region: N/A Anterior Thigh Region: N/A Posterior Calf Region: N/A  Edema: none present   Height: Ht Readings from Last 1 Encounters:  06/20/13 5' 1.81" (1.57 m)    Weight: Wt Readings from Last 1 Encounters:  06/20/13 116 lb 2.9 oz (52.7 kg)    Ideal Body Weight: 105 lbs  % Ideal Body Weight: 110%  Wt Readings from Last 10 Encounters:  06/20/13 116 lb 2.9 oz (52.7 kg)  01/01/13 119 lb 4.8  oz (54.114 kg)  01/31/12 124 lb 1.9 oz (56.3 kg)  12/17/11 123 lb 9.6 oz (56.065 kg)  11/15/11 128 lb 3.2 oz (58.151 kg)  08/04/11 126 lb 9.6 oz (57.425 kg)    Usual Body Weight: 125 lbs  % Usual Body Weight: 92%  BMI:  Body mass index is 21.38 kg/(m^2).  Estimated Nutritional Needs: Kcal: 1348 Protein: 65-78g Fluid: >1.5 L/day  Skin: intact  Diet Order: NPO  EDUCATION NEEDS: -Education not appropriate at this time   Intake/Output Summary (Last 24 hours) at 06/20/13 0924 Last data filed at 06/20/13 0800  Gross per 24 hour  Intake 483.19 ml  Output    360 ml  Net 123.19 ml    Last BM: PTA  Labs:   Recent Labs Lab 06/19/13 2148 06/20/13 0305 06/20/13 0501  NA 140 144 138  K 6.1* 4.7 4.9  CL 99 101 97  CO2 31 30 30   BUN 19 23 26*  CREATININE 1.44* 1.34* 1.64*  CALCIUM 8.9 8.5 9.0  GLUCOSE 98 147* 119*    CBG (last 3)   Recent Labs  06/20/13 0044 06/20/13 0409 06/20/13 0814  GLUCAP 94 113* 119*    Scheduled Meds: . ipratropium  0.5 mg Nebulization Q4H   And  . albuterol  2.5 mg Nebulization Q4H  . antiseptic oral rinse  1 application Mouth Rinse QID  . aspirin  325 mg Oral Daily  . chlorhexidine  15 mL Mouth/Throat BID  . heparin subcutaneous  5,000 Units Subcutaneous Q8H  .  methylPREDNISolone (SOLU-MEDROL) injection  80 mg Intravenous Q8H  . midazolam      . pantoprazole (PROTONIX) IV  40 mg Intravenous Q24H    Continuous Infusions: . acetylcysteine 15 mg/kg/hr (06/20/13 0800)  . fentaNYL infusion INTRAVENOUS 50 mcg/hr (06/20/13 0800)  . midazolam (VERSED) infusion 1 mg/hr (06/20/13 0800)    Past Medical History  Diagnosis Date  . Angina   . Heart murmur   . Depressed   . Coronary artery disease 2006    2 stents w/previous MI  . Hypertension   . Migraine   . Anxiety   . Myocardial infarction 2006  . COPD (chronic obstructive pulmonary disease)     oxygen-dependent 4LPM [place]  . Moderate to severe pulmonary hypertension   .  Diastolic CHF   . Cancer of breast dx'd 2008    RT Breast    Past Surgical History  Procedure Laterality Date  . Breast lumpectomy      Right  . Cardiac catheterization  2006  . Cholecystectomy    . Abdominal surgery      Loyce Dys, MS RD LDN Clinical Inpatient Dietitian Pager: [phone] Weekend/After hours pager: [phone]

## 2013-06-20 NOTE — ED Provider Notes (Signed)
I performed a history and physical examination of  [name] and discussed her management with Dr. Modesto Charon. I agree with the history, physical, assessment, and plan of care, with the following exceptions: None  I was present for the following procedures: INTUBATION  Time Spent in Critical Care of the patient: 62 MINUTES  Time spent in discussions with the patient and family: 15 minutes  [name]  CRITICAL CARE Performed by: Derwood Kaplan   Total critical care time: 35 minutes  Critical care time was exclusive of separately billable procedures and treating other patients.  Critical care was necessary to treat or prevent imminent or life-threatening deterioration.  Critical care was time spent personally by me on the following activities: development of treatment plan with patient and/or surrogate as well as nursing, discussions with consultants, evaluation of patient's response to treatment, examination of patient, obtaining history from patient or surrogate, ordering and performing treatments and interventions, ordering and review of laboratory studies, ordering and review of radiographic studies, pulse oximetry and re-evaluation of patient's condition.  Pt comes in with altered mental status and hypoxic respiratory failure.  Likely some component of medication side effect. Pt has diffuse crackles, and COPD, CHF hx, so component of COPD also considered. Pt had hypercapnic and hypoxic resp failure.  Intubated per CCM request for worsening hypercapnia and poor mentation.  LFTs elevated - could be due to pulm HTN or tylenol toxicity. Tylenol levels are normal.  DX: Hypercapnic and Hypoxic Respiratoy failure Hyperkalemia Elevated transaminase Elevated troponin    Derwood Kaplan, MD 06/20/13 0235

## 2013-06-20 NOTE — Progress Notes (Signed)
Attempted to SBT wean vent, pt w/ very low vt 50-100 ml, attempted  To increased ps, however pt desat to 83%. Placed pt back on full support, RN aware.

## 2013-06-20 NOTE — Progress Notes (Signed)
Wasted 30mL versed with Melody, RN

## 2013-06-20 NOTE — Progress Notes (Addendum)
Call from Poison control. INR rising to 1.6. PEr RN Denny Peon  - contnue NAC indefinitely - check NH3 now - do not start heparin IV - check LFT and INR 06/21/13 AM   Dr. Kalman Shan, M.D., [facility].C.P Pulmonary and Critical Care Medicine Staff Physician [facility] Pulmonary and Critical Care Pager: [phone], If no answer or between  15:00h - 7:00h: call [phone]  06/20/2013 3:01 PM

## 2013-06-21 ENCOUNTER — Inpatient Hospital Stay (HOSPITAL_COMMUNITY): Payer: Medicare Other

## 2013-06-21 DIAGNOSIS — Z5189 Encounter for other specified aftercare: Secondary | ICD-10-CM

## 2013-06-21 DIAGNOSIS — T391X1A Poisoning by 4-Aminophenol derivatives, accidental (unintentional), initial encounter: Secondary | ICD-10-CM

## 2013-06-21 DIAGNOSIS — J449 Chronic obstructive pulmonary disease, unspecified: Secondary | ICD-10-CM

## 2013-06-21 LAB — CBC WITH DIFFERENTIAL/PLATELET
Basophils Absolute: 0 10*3/uL (ref 0.0–0.1)
Basophils Relative: 0 % (ref 0–1)
Eosinophils Absolute: 0 10*3/uL (ref 0.0–0.7)
Eosinophils Relative: 0 % (ref 0–5)
HCT: 35.9 % — ABNORMAL LOW (ref 36.0–46.0)
Hemoglobin: 12.1 g/dL (ref 12.0–15.0)
Lymphocytes Relative: 4 % — ABNORMAL LOW (ref 12–46)
Lymphs Abs: 0.4 10*3/uL — ABNORMAL LOW (ref 0.7–4.0)
MCH: 32.3 pg (ref 26.0–34.0)
MCHC: 33.7 g/dL (ref 30.0–36.0)
MCV: 95.7 fL (ref 78.0–100.0)
Monocytes Absolute: 0.4 10*3/uL (ref 0.1–1.0)
Monocytes Relative: 4 % (ref 3–12)
Neutro Abs: 9.6 10*3/uL — ABNORMAL HIGH (ref 1.7–7.7)
Neutrophils Relative %: 92 % — ABNORMAL HIGH (ref 43–77)
Platelets: 163 10*3/uL (ref 150–400)
RBC: 3.75 MIL/uL — ABNORMAL LOW (ref 3.87–5.11)
RDW: 13.6 % (ref 11.5–15.5)
WBC: 10.4 10*3/uL (ref 4.0–10.5)

## 2013-06-21 LAB — BASIC METABOLIC PANEL
BUN: 43 mg/dL — ABNORMAL HIGH (ref 6–23)
CO2: 24 mEq/L (ref 19–32)
Calcium: 9.2 mg/dL (ref 8.4–10.5)
Chloride: 99 mEq/L (ref 96–112)
Creatinine, Ser: 1.84 mg/dL — ABNORMAL HIGH (ref 0.50–1.10)
GFR calc Af Amer: 32 mL/min — ABNORMAL LOW (ref >90)
GFR calc non Af Amer: 27 mL/min — ABNORMAL LOW (ref >90)
Glucose, Bld: 164 mg/dL — ABNORMAL HIGH (ref 70–99)
Potassium: 3.9 mEq/L (ref 3.5–5.1)
Sodium: 141 mEq/L (ref 135–145)

## 2013-06-21 LAB — HEPATIC FUNCTION PANEL
ALT: 4172 U/L — ABNORMAL HIGH (ref 0–35)
AST: 3092 U/L — ABNORMAL HIGH (ref 0–37)
Albumin: 2.5 g/dL — ABNORMAL LOW (ref 3.5–5.2)
Alkaline Phosphatase: 196 U/L — ABNORMAL HIGH (ref 39–117)
Bilirubin, Direct: <0.1 mg/dL (ref 0.0–0.3)
Total Bilirubin: 0.1 mg/dL — ABNORMAL LOW (ref 0.3–1.2)
Total Protein: 6.6 g/dL (ref 6.0–8.3)

## 2013-06-21 LAB — RESPIRATORY VIRUS PANEL
Adenovirus: NOT DETECTED
Influenza A H1: NOT DETECTED
Influenza A H3: NOT DETECTED
Influenza A: NOT DETECTED
Influenza B: NOT DETECTED
Metapneumovirus: NOT DETECTED
Parainfluenza 1: NOT DETECTED
Parainfluenza 2: NOT DETECTED
Parainfluenza 3: NOT DETECTED
Respiratory Syncytial Virus A: NOT DETECTED
Respiratory Syncytial Virus B: NOT DETECTED
Rhinovirus: DETECTED — AB

## 2013-06-21 LAB — GLUCOSE, CAPILLARY
Glucose-Capillary: 127 mg/dL — ABNORMAL HIGH (ref 70–99)
Glucose-Capillary: 118 mg/dL — ABNORMAL HIGH (ref 70–99)

## 2013-06-21 LAB — PHOSPHORUS: Phosphorus: 3.2 mg/dL (ref 2.3–4.6)

## 2013-06-21 LAB — PROCALCITONIN: Procalcitonin: 0.66 ng/mL

## 2013-06-21 LAB — LACTIC ACID, PLASMA: Lactic Acid, Venous: 1.7 mmol/L (ref 0.5–2.2)

## 2013-06-21 LAB — PROTIME-INR
INR: 1.44 (ref 0.00–1.49)
Prothrombin Time: 17.2 seconds — ABNORMAL HIGH (ref 11.6–15.2)

## 2013-06-21 LAB — PRO B NATRIURETIC PEPTIDE: Pro B Natriuretic peptide (BNP): 3048 pg/mL — ABNORMAL HIGH (ref 0–125)

## 2013-06-21 LAB — MAGNESIUM: Magnesium: 2.1 mg/dL (ref 1.5–2.5)

## 2013-06-21 MED ORDER — FUROSEMIDE 10 MG/ML IJ SOLN
20.0000 mg | Freq: Once | INTRAMUSCULAR | Status: AC
  Administered 2013-06-21: 20 mg via INTRAVENOUS

## 2013-06-21 MED ORDER — INFLUENZA VAC SPLIT QUAD 0.5 ML IM SUSP
0.5000 mL | INTRAMUSCULAR | Status: AC
  Administered 2013-06-22: 0.5 mL via INTRAMUSCULAR
  Filled 2013-06-21: qty 0.5

## 2013-06-21 MED ORDER — METHYLPREDNISOLONE SODIUM SUCC 40 MG IJ SOLR
40.0000 mg | Freq: Two times a day (BID) | INTRAMUSCULAR | Status: DC
  Administered 2013-06-21 – 2013-06-22 (×2): 40 mg via INTRAVENOUS
  Filled 2013-06-21 (×4): qty 1

## 2013-06-21 MED ORDER — FUROSEMIDE 10 MG/ML IJ SOLN
20.0000 mg | Freq: Once | INTRAMUSCULAR | Status: AC
  Administered 2013-06-21: 20 mg via INTRAVENOUS
  Filled 2013-06-21: qty 2

## 2013-06-21 MED ORDER — ALPRAZOLAM 0.5 MG PO TABS
1.0000 mg | ORAL_TABLET | Freq: Three times a day (TID) | ORAL | Status: DC | PRN
  Administered 2013-06-21 – 2013-06-25 (×5): 1 mg via ORAL
  Filled 2013-06-21 (×6): qty 2

## 2013-06-21 NOTE — Progress Notes (Signed)
06/21/2013- 1325- Resp care note- Pt suctioned and extubated from vent.  Pt placed on BIPAP post extubation as per MD order.  Pt tolerated well.  Pt vocalizing post extubation.  Will follow pt progress and wean from BIPAP as tolerated.

## 2013-06-21 NOTE — Progress Notes (Addendum)
PULMONARY  / CRITICAL CARE MEDICINE  Name: [name] MRN: [id number] DOB: 11/24/1945    ADMISSION DATE:  06/19/2013 CONSULTATION DATE:  06/19/2013  REFERRING MD :  EDP PRIMARY SERVICE:  PCCM  CHIEF COMPLAINT:  Altered mental status  BRIEF PATIENT DESCRIPTION: 67 yo with oxygen-dependent COPD, pulmonary hypertension, diastolic CHF, on Xanax, Fioricet, Codeine and Nortriptyline found at home unresponsive.  Upon arrival to ED ABG revealed respiratory acidosis. Tox screen positive for barbiturates, opioids and benzodiazepines. Placed on BiPAP and given narcan with some improvement of mental status, however hypercarbia worsened and emesis developed.  Patient intubated.   :  LINES / TUBES: OETT 9/23 >>>9/25 (planned) OGT 9/23 >>>9/25 (planned) Foley 9/23 >>>   CULTURES: 9/23 Blood >>> 9/23 Respiratory >>> 9/24 MRSA by PCR >>> negative 06/20/13 Resp Virus Multiplex PCR >>>    ANTIBIOTICS: Levaquin 9/23 >>>  SIG NIFICANT EVENTS / STUDIES 06/19/2013 >> ADMIT. Urint tox positive for barb, benzoe and opioids. Tylenol level < 15 06/20/13: Tylenol level < 15. On NAC   INTERVAL HISTORY: 9/25 WUA, Fentanyl gtt turned off, pt tolerated well. Pt sitting up in bed, watching tv, in NAD. ETT in place, Fi02 40%, O2 sat 97%.  Had an episode of anxiety (9/24 PM) w/ increased BP. Precedex was increased 0.4 >>0.6. No further problems. Currently on 0.4 Precedex with BP 114/58.  Pt has flatus, no BM since admitted 9/23. 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   Denies chest pain, abdominal pain, dyspnea, headache.   STAFF note later on rounds: Meets extubation criteria. Creat rising but BNP high. Poison control recommends continued NAC and IV fluids  VITAL SIGNS: Temp:  [97.3 F (36.3 C)-99.4 F (37.4 C)] 97.5 F (36.4 C) (09/25 0834) Pulse Rate:  [70-90] 80 (09/25 0900) Resp:  [10-24] 19 (09/25 0900) BP: (97-121)/(29-99) 117/51 mmHg (09/25 0900) SpO2:  [95 %-99 %] 97 % (09/25  0900) FiO2 (%):  [40 %] 40 % (09/25 0900) Weight:  [53.8 kg (118 lb 9.7 oz)] 53.8 kg (118 lb 9.7 oz) (09/25 0600)  HEMODYNAMICS:   VENTILATOR SETTINGS: Vent Mode:  [-] CPAP FiO2 (%):  [40 %] 40 % Set Rate:  [16 bmp-20 bmp] 16 bmp Vt Set:  [450 mL] 450 mL PEEP:  [5 cmH20] 5 cmH20 Pressure Support:  [5 cmH20] 5 cmH20 Plateau Pressure:  [19 cmH20-23 cmH20] 19 cmH20 INTAKE / OUTPUT: Intake/Output     09/24 0701 - 09/25 0700 09/25 0701 - 09/26 0700   I.V. (mL/kg) 415.7 (7.7) 53.8 (1)   NG/GT 670 120   Total Intake(mL/kg) 1085.7 (20.2) 173.8 (3.2)   Urine (mL/kg/hr) 1510 (1.2) 160 (1.1)   Total Output 1510 160   Net -424.3 +13.8         PHYSICAL EXAMINATION: General: Appears chronically ill, Mechanically ventilated, FiO2 40%, PEEP 5 on PSV Neuro:  A&O x 3. (STaff note: RASS 0, cAM-ICU neg for delirium, off precedex) HEENT:  PERRL, 2 mm bilateral pupils, no icterus, OETT / OGT Cardiovascular:  RRR, no m/r/g Lungs:  Bilateral inspiratory wheezes, expiratory rhonchi right apex> left apex, R lumpectomy scar (staff note: reports baseline wheeze) Abdomen:  Soft, nontender, +BS Musculoskeletal:  Moves all extremities, no edema Skin:  Intact  LABS: PULMONARY  Recent Labs Lab 06/19/13 1846 06/19/13 2046  PHART 7.257* 7.264*  PCO2ART 73.8* 80.9*  PO2ART 93.0 128.0*  HCO3 32.9* 36.6*  TCO2 35 39  O2SAT 95.0 98.0    CBC  Recent Labs Lab 06/19/13 1900 06/20/13 0501  06/21/13 0502  HGB 15.1* 11.9* 12.1  HCT 46.1* 36.7 35.9*  WBC 7.8 10.0 10.4  PLT 234 172 163    COAGULATION  Recent Labs Lab 06/19/13 2318 06/20/13 1025 06/21/13 0502  INR 1.43 1.60* 1.44    CARDIAC    Recent Labs Lab 06/19/13 2318 06/20/13 0501 06/20/13 1025 06/20/13 1357 06/20/13 2030  TROPONINI 0.47* 0.40* 0.46* 0.38* <0.30    Recent Labs Lab 06/19/13 1900 06/21/13 0502  PROBNP 5267.0* 3048.0*     CHEMISTRY  Recent Labs Lab 06/19/13 1900  06/19/13 2148 06/20/13 0305  06/20/13 0501 06/21/13 0502  NA 138  --  140 144 138 141  K 7.4*  < > 6.1* 4.7 4.9 3.9  CL 98  --  99 101 97 99  CO2 28  --  31 30 30 24   GLUCOSE 74  --  98 147* 119* 164*  BUN 16  --  19 23 26* 43*  CREATININE 1.50*  --  1.44* 1.34* 1.64* 1.84*  CALCIUM 9.2  --  8.9 8.5 9.0 9.2  MG  --   --   --   --   --  2.1  PHOS  --   --   --   --   --  3.2  < > = values in this interval not displayed. Estimated Creatinine Clearance: 23.3 ml/min (by C-G formula based on Cr of 1.84).   LIVER  Recent Labs Lab 06/19/13 1900 06/19/13 2148 06/19/13 2318 06/20/13 0305 06/20/13 1025 06/21/13 0502  AST 1163* 4544*  --  6758*  --  3092*  ALT 1128* 4140*  --  4726*  --  4172*  ALKPHOS 261* 239*  --  188*  --  196*  BILITOT 0.5 0.3  --  0.2*  --  0.1*  PROT 8.1 7.4  --  6.2  --  6.6  ALBUMIN 3.4* 3.2*  --  2.5*  --  2.5*  INR  --   --  1.43  --  1.60* 1.44     INFECTIOUS  Recent Labs Lab 06/20/13 0501 06/20/13 0940 06/20/13 1025 06/21/13 0502  LATICACIDVEN 1.1  --  1.1 1.7  PROCALCITON  --  0.98  --  0.66     ENDOCRINE CBG (last 3)   Recent Labs  06/20/13 0814 06/20/13 1521 06/20/13 2005  GLUCAP 119* 119* 150*         IMAGING x48h  Ct Head Wo Contrast  06/19/2013   CLINICAL DATA:  Unresponsive patient. Difficulty breathing. Cyanosis.  EXAM: CT HEAD WITHOUT CONTRAST  TECHNIQUE: Contiguous axial images were obtained from the base of the skull through the vertex without intravenous contrast.  COMPARISON:  07/31/2011  FINDINGS: The brainstem, cerebellum, cerebral peduncles, thalamus, basal ganglia, basilar cisterns, and ventricular system appear within normal limits. No intracranial hemorrhage, mass lesion, or acute CVA.  IMPRESSION: No significant abnormality identified.   Electronically Signed   By: Herbie Baltimore   On: 06/19/2013 19:50   Dg Chest Port 1 View  06/21/2013   CLINICAL DATA:  Check endotracheal tube. Shortness of breath.  EXAM: PORTABLE CHEST - 1 VIEW   COMPARISON:  06/20/2013.  FINDINGS: Endotracheal tube ends in the mid thoracic trachea. An enteric tube crosses the diaphragm, with side port near the GE junction.  No change in mild cardiomegaly.  Patchy reticular nodular opacities are stable from priors, most dense at the right apex. No evidence of effusion or pneumothorax. Right axillary dissection.  IMPRESSION: 1. Stable positioning  of enteric and endotracheal tubes. 2. Unchanged lung aeration.   Electronically Signed   By: Tiburcio Pea   On: 06/21/2013 05:23   Dg Chest Port 1 View  06/20/2013   CLINICAL DATA:  67 year old female intubated. Respiratory failure.  EXAM: PORTABLE CHEST - 1 VIEW  COMPARISON:  06/19/2013 and earlier.  FINDINGS: Portable AP semi upright view at at 1005 hrs. Stable endotracheal tube tip. Enteric tube now in place, courses to the abdomen. Multiple EKG leads and wires overlie the chest. Stable postoperative changes to the right chest wall and axilla. Stable lung volumes. Stable cardiac size and mediastinal contours. Mildly decreased interstitial opacity. No pneumothorax or consolidation. Possible small pleural effusions.  IMPRESSION: 1. Stable endotracheal tube. Enteric tube in place, courses to the left upper quadrant.  2. Mildly decreased vascular congestion with possible small pleural effusions. No other acute cardiopulmonary abnormality.   Electronically Signed   By: Augusto Gamble M.D.   On: 06/20/2013 10:36   Dg Chest Portable 1 View  06/19/2013   CLINICAL DATA:  Endotracheal tube placement.  EXAM: PORTABLE CHEST - 1 VIEW  COMPARISON:  Earlier the same date. CT 01/31/2012.  FINDINGS: 2246 hr. Interval intubation. The tip of the endotracheal tube is approximately 3.1 cm above the carina. Cardiomegaly, vascular congestion and generalized interstitial prominence are unchanged. There is no airspace disease or significant pleural effusion. Surgical clips are present within the right axilla.  IMPRESSION: Satisfactorily positioned  endotracheal tube. No new findings identified.   Electronically Signed   By: Roxy Horseman   On: 06/19/2013 22:59   Dg Chest Port 1 View  06/19/2013   CLINICAL DATA:  Respiratory distress. Current history of breast cancer for which the patient has undergone lumpectomy and radiation therapy.  EXAM: PORTABLE CHEST - 1 VIEW  COMPARISON:  CT chest 01/31/2012. Portable chest x-ray 11/10/2011 and 06/03/2011.  FINDINGS: Cardiac silhouette mildly enlarged but stable. Thoracic aorta mildly atherosclerotic, unchanged. Prominent central pulmonary arteries, similar to the prior examinations. Mild diffuse interstitial lung disease, present on the prior examinations and unchanged. Scarring in the right lung apex and scar/bronchiectasis in the right middle lobe as noted on the prior CT. No new pulmonary parenchymal abnormalities.  IMPRESSION: Stable mild chronic interstitial lung disease and the scarring in the right lung apex and right middle lobe. No acute cardiopulmonary disease.   Electronically Signed   By: Hulan Saas   On: 06/19/2013 19:16     ASSESSMENT / PLAN:  PULMONARY A:  Acute respiratory failure in setting of suspected polysubstance overdose, possible acute COPD exacerbation and possible aspiration.  Pulmonary hypertension (PAP 76 torr Feb 2013).  06/21/13: Meets extubation criteria (staff note)  P:   Extubate to bipap Albuterol / Atrovent Solu-Medrol 80 q8h -> 40 Q24h   CARDIOVASCULAR A: Diastolic CHF, doubt exacerbation would lead to hypercarbic failure. CAD. Likely Type 2 NSTEMI due to stress, Troponin and Lactic Acid normalized, <30 and 1.7 respectively (9/25)  9./25/14 : BNP high  P:  Goal MAP>60 Continue ASA Hold Lipitor as elevated transaminases Get ECHO x 1, pending No indication for IV heparin gtt  RENAL A:  AKI.   Hyperkalemia, resolved 3.9.  06/21/13: Making urine. Negative balance but creat rising ? Due to tylenol v lasix. Currently BNP high  P:   Trend  BMP, NS@KVO  Lasix x 1 -> if creat rises with this then consider IV fluiids   GASTROINTESTINAL A:  Acute liver failure. Highly suspect Tylenol toxicity (according to Pharmacy filled  multiple prescriptions for Fioricet from multiple providers for the total of 250 tabs over last 10 days). Possible liver congestion in setting of severe pulmonary hypertension.  06/20/13: Admits to xs tyelnol ingestion due to pain. Tylenol leve <15 x 2 but still possible toxicity  06/21/13: Improved INR and stablizing AST/ALT  P:   NAC per poison control to continue, reassess 06/22/13 DC Tube feeds Protonix for GI Px Stool softener if extubated Trend LFT Avoid hepatotoxic agents    HEMATOLOGIC A:  No active issues. P:  Trend CBC Heparin SQ, and SCD for dvt proph   INFECTIOUS A:  AECOPD followiung  URI and exposure to sick contacts. Likely Virus  P:   Empirical fluroquinolone for AECOPD as above PCT Await REsp Virus PCR panel from 06/20/13  ENDOCRINE  A:  Hyperglycemia, mild P:   CBG q4h SSI  NEUROLOGIC A:  Acute encephalopathy at admit  06/21/13: Normal WUA sedation gtt turned off, pt tolerating well  P:   DC precedex and fentanyl gtt from orders Goal RASS 0 to -1 Restart home  Xanax HOld home  Fioricet, Nortriptyline   GLOBAL 06/21/13 : STAFF COMMENT: Plan to extubate pt today to bipap   STAFF NOTE: I, Dr Lavinia Sharps have personally reviewed patient's available data, including medical history, events of note, physical examination and test results as part of my evaluation. I have discussed with resident/NP and other care providers such as pharmacist, RN and RRT.  In addition,  I personally evaluated patient and elicited key findings of Acute resp failure - special comments made by me in note above.  Rest per PA student Joni Reining whose note is outlined above and that I agree with  The patient is critically ill with multiple organ systems failure and requires high complexity decision  making for assessment and support, frequent evaluation and titration of therapies, application of advanced monitoring technologies and extensive interpretation of multiple databases.   Critical Care Time devoted to patient care services described in this note is  35  Minutes.  Dr. Kalman Shan, M.D., [facility].C.P Pulmonary and Critical Care Medicine Staff Physician [facility] [place] [facility] Pager: [phone], If no answer or between  15:00h - 7:00h: call [phone]  06/21/2013 11:55 AM        The patient is critically ill with multiple organ systems failure and requires high complexity decision making for assessment and support, frequent evaluation and titration of therapies, application of advanced monitoring technologies and extensive interpretation of multiple databases.   Critical Care Time devoted to patient care services described in this note is  35  Minutes.  Dr. Kalman Shan, M.D., [facility].C.P Pulmonary and Critical Care Medicine Staff Physician [facility] [place] [facility] Pager: [phone], If no answer or between  15:00h - 7:00h: call [phone]  06/21/2013 9:41 AM

## 2013-06-22 LAB — CULTURE, RESPIRATORY W GRAM STAIN: Culture: NO GROWTH

## 2013-06-22 LAB — COMPREHENSIVE METABOLIC PANEL
ALT: 2872 U/L — ABNORMAL HIGH (ref 0–35)
AST: 946 U/L — ABNORMAL HIGH (ref 0–37)
Albumin: 2.6 g/dL — ABNORMAL LOW (ref 3.5–5.2)
Alkaline Phosphatase: 183 U/L — ABNORMAL HIGH (ref 39–117)
BUN: 41 mg/dL — ABNORMAL HIGH (ref 6–23)
CO2: 31 mEq/L (ref 19–32)
Calcium: 9.6 mg/dL (ref 8.4–10.5)
Chloride: 100 mEq/L (ref 96–112)
Creatinine, Ser: 1.08 mg/dL (ref 0.50–1.10)
GFR calc Af Amer: 60 mL/min — ABNORMAL LOW (ref >90)
GFR calc non Af Amer: 52 mL/min — ABNORMAL LOW (ref >90)
Glucose, Bld: 106 mg/dL — ABNORMAL HIGH (ref 70–99)
Potassium: 3.7 mEq/L (ref 3.5–5.1)
Sodium: 143 mEq/L (ref 135–145)
Total Bilirubin: 0.2 mg/dL — ABNORMAL LOW (ref 0.3–1.2)
Total Protein: 6.8 g/dL (ref 6.0–8.3)

## 2013-06-22 LAB — CBC WITH DIFFERENTIAL/PLATELET
Basophils Absolute: 0 10*3/uL (ref 0.0–0.1)
Basophils Relative: 0 % (ref 0–1)
Eosinophils Absolute: 0 10*3/uL (ref 0.0–0.7)
Eosinophils Relative: 0 % (ref 0–5)
HCT: 39.9 % (ref 36.0–46.0)
Hemoglobin: 13 g/dL (ref 12.0–15.0)
Lymphocytes Relative: 6 % — ABNORMAL LOW (ref 12–46)
Lymphs Abs: 0.8 10*3/uL (ref 0.7–4.0)
MCH: 31.6 pg (ref 26.0–34.0)
MCHC: 32.6 g/dL (ref 30.0–36.0)
MCV: 97.1 fL (ref 78.0–100.0)
Monocytes Absolute: 1 10*3/uL (ref 0.1–1.0)
Monocytes Relative: 8 % (ref 3–12)
Neutro Abs: 11.1 10*3/uL — ABNORMAL HIGH (ref 1.7–7.7)
Neutrophils Relative %: 86 % — ABNORMAL HIGH (ref 43–77)
Platelets: 181 10*3/uL (ref 150–400)
RBC: 4.11 MIL/uL (ref 3.87–5.11)
RDW: 13.5 % (ref 11.5–15.5)
WBC: 12.8 10*3/uL — ABNORMAL HIGH (ref 4.0–10.5)

## 2013-06-22 LAB — GLUCOSE, CAPILLARY
Glucose-Capillary: 109 mg/dL — ABNORMAL HIGH (ref 70–99)
Glucose-Capillary: 115 mg/dL — ABNORMAL HIGH (ref 70–99)
Glucose-Capillary: 116 mg/dL — ABNORMAL HIGH (ref 70–99)
Glucose-Capillary: 131 mg/dL — ABNORMAL HIGH (ref 70–99)
Glucose-Capillary: 98 mg/dL (ref 70–99)
Glucose-Capillary: 98 mg/dL (ref 70–99)

## 2013-06-22 LAB — CK: Total CK: 54 U/L (ref 7–177)

## 2013-06-22 LAB — PROTIME-INR
INR: 1.25 (ref 0.00–1.49)
Prothrombin Time: 15.4 seconds — ABNORMAL HIGH (ref 11.6–15.2)

## 2013-06-22 LAB — PHOSPHORUS: Phosphorus: 2.8 mg/dL (ref 2.3–4.6)

## 2013-06-22 LAB — MAGNESIUM: Magnesium: 1.9 mg/dL (ref 1.5–2.5)

## 2013-06-22 LAB — PRO B NATRIURETIC PEPTIDE: Pro B Natriuretic peptide (BNP): 1715 pg/mL — ABNORMAL HIGH (ref 0–125)

## 2013-06-22 LAB — PROCALCITONIN: Procalcitonin: 0.38 ng/mL

## 2013-06-22 MED ORDER — OXYCODONE HCL 5 MG PO TABS
5.0000 mg | ORAL_TABLET | Freq: Once | ORAL | Status: AC
  Administered 2013-06-22: 5 mg via ORAL
  Filled 2013-06-22: qty 1

## 2013-06-22 MED ORDER — LEVOFLOXACIN 500 MG PO TABS: 500.0000 mg | ORAL_TABLET | Freq: Every day | ORAL | Status: DC

## 2013-06-22 MED ORDER — LEVOFLOXACIN 750 MG PO TABS
750.0000 mg | ORAL_TABLET | ORAL | Status: DC
  Administered 2013-06-23: 750 mg via ORAL
  Filled 2013-06-22: qty 1

## 2013-06-22 MED ORDER — FENTANYL CITRATE 0.05 MG/ML IJ SOLN
12.5000 ug | INTRAMUSCULAR | Status: DC | PRN
  Administered 2013-06-22 – 2013-06-25 (×5): 12.5 ug via INTRAVENOUS
  Filled 2013-06-22 (×4): qty 2

## 2013-06-22 MED ORDER — METHYLPREDNISOLONE SODIUM SUCC 40 MG IJ SOLR
40.0000 mg | INTRAMUSCULAR | Status: DC
  Administered 2013-06-23 – 2013-06-25 (×3): 40 mg via INTRAVENOUS
  Filled 2013-06-22 (×5): qty 1

## 2013-06-22 MED ORDER — FENTANYL CITRATE 0.05 MG/ML IJ SOLN
INTRAMUSCULAR | Status: AC
  Administered 2013-06-22: 12.5 ug via INTRAVENOUS
  Filled 2013-06-22: qty 2

## 2013-06-22 MED ORDER — ENSURE COMPLETE PO LIQD
237.0000 mL | Freq: Two times a day (BID) | ORAL | Status: DC
  Administered 2013-06-22 – 2013-06-24 (×3): 237 mL via ORAL

## 2013-06-22 MED ORDER — BIOTENE DRY MOUTH MT LIQD
15.0000 mL | Freq: Two times a day (BID) | OROMUCOSAL | Status: DC
  Administered 2013-06-22 – 2013-06-24 (×5): 15 mL via OROMUCOSAL

## 2013-06-22 NOTE — Progress Notes (Signed)
eLink Physician-Brief Progress Note Patient Name: [name] DOB: [birth year] MRN: [id number]  Date of Service  06/22/2013   HPI/Events of Note  Patient c/o of migraine headache and requesting home medication fioricet however patient with elevated LFTs following overmedication with tylenol containing meds.  eICU Interventions  Plan: Hold fioricet and all meds with tylenol One time dose of oxycodone 5 mg   Intervention Category Intermediate Interventions: Pain - evaluation and management  Meliana Canner 06/22/2013, 12:04 AM

## 2013-06-22 NOTE — Evaluation (Signed)
Physical Therapy Evaluation Patient Details Name: Myrth Dahan MRN: [id number] DOB: December 06, 1945 Today's Date: 06/22/2013 Time: 4098-1191 PT Time Calculation (min): 25 min  PT Assessment / Plan / Recommendation History of Present Illness  67 yo with oxygen-dependent COPD, pulmonary hypertension, diastolic CHF, on Xanax, Fioricet, Codeine and Nortriptyline found at home unresponsive.  Upon arrival to ED ABG revealed respiratory acidosis. Tox screen positive for barbiturates, opioids and benzodiazepines. Placed on BiPAP and given narcan with some improvement of mental status, however hypercarbia worsened and emesis developed.  Patient intubated.   Patient extubated 06/21/13.  Clinical Impression  Patient presents with problems listed below.  Will benefit from acute PT to maximize independence and safety with mobility.  Do not anticipate any f/u PT needs at discharge.    PT Assessment  Patient needs continued PT services    Follow Up Recommendations  Supervision/Assistance - 24 hour;No PT follow up    Does the patient have the potential to tolerate intense rehabilitation      Barriers to Discharge        Equipment Recommendations  None recommended by PT    Recommendations for Other Services     Frequency Min 3X/week    Precautions / Restrictions Precautions Precautions: Fall Restrictions Weight Bearing Restrictions: No   Pertinent Vitals/Pain       Mobility  Bed Mobility Bed Mobility: Supine to Sit;Sitting - Scoot to Edge of Bed Supine to Sit: 5: Supervision;HOB elevated Sitting - Scoot to Edge of Bed: 5: Supervision Details for Bed Mobility Assistance: Cues to move more slowly and to watch IV as she moves Transfers Transfers: Sit to Stand;Stand to Sit Sit to Stand: 4: Min guard;With upper extremity assist;From bed Stand to Sit: 4: Min guard;With upper extremity assist;To bed Details for Transfer Assistance: Cues to wait for equipment to be prepared before standing, for  safety.  Patient standing impulsively prior to IV being prepared and RW in place.  When returning to sitting, patient left RW at foot of bed.  Instructed patient to keep AD close to her for safety until prepared to sit safely. Ambulation/Gait Ambulation/Gait Assistance: 4: Min guard Ambulation Distance (Feet): 186 Feet Assistive device: Rolling walker Ambulation/Gait Assistance Details: Verbal cues for safe use of RW, and to slow to safe speed. Gait Pattern: Step-through pattern    Exercises     PT Diagnosis: Difficulty walking;Generalized weakness;Altered mental status  PT Problem List: Decreased strength;Decreased balance;Decreased mobility;Decreased cognition;Decreased safety awareness;Cardiopulmonary status limiting activity PT Treatment Interventions: DME instruction;Gait training;Stair training;Functional mobility training;Balance training;Cognitive remediation;Patient/family education     PT Goals(Current goals can be found in the care plan section) Acute Rehab PT Goals Patient Stated Goal: To return home PT Goal Formulation: With patient/family Time For Goal Achievement: 06/29/13 Potential to Achieve Goals: Good  Visit Information  Last PT Received On: 06/22/13 Assistance Needed: +1 History of Present Illness: 67 yo with oxygen-dependent COPD, pulmonary hypertension, diastolic CHF, on Xanax, Fioricet, Codeine and Nortriptyline found at home unresponsive.  Upon arrival to ED ABG revealed respiratory acidosis. Tox screen positive for barbiturates, opioids and benzodiazepines. Placed on BiPAP and given narcan with some improvement of mental status, however hypercarbia worsened and emesis developed.  Patient intubated.   Patient extubated 06/21/13.       Prior Functioning  Home Living Family/patient expects to be discharged to:: Private residence Living Arrangements: Children;Other relatives (Lives with daughter and 2 grandchildren) Available Help at Discharge: Family;Available 24  hours/day (Daughter does not work) Type of Home: [employer]  Access: Level entry Home Layout: Two level;Bed/bath upstairs Alternate Level Stairs-Number of Steps: flight Alternate Level Stairs-Rails: Right;Left Home Equipment: Walker - 4 wheels;Cane - single point;Shower seat - built in Additional Comments: Bed/bath on 1st floor.  Patient prefers 2nd floor bedroom to be closer to family Prior Function Level of Independence: Independent with assistive device(s) (Uses cane occasionally) Communication Communication: No difficulties    Cognition  Cognition Arousal/Alertness: Awake/alert Behavior During Therapy: Impulsive;Restless (Laughing inappropriately throughout session) Overall Cognitive Status: History of cognitive impairments - at baseline Area of Impairment: Attention;Safety/judgement;Problem solving Current Attention Level: Selective (Very easily distracted) Safety/Judgement: Decreased awareness of safety Problem Solving: Difficulty sequencing;Requires verbal cues General Comments: Patient very impulsive, moving very quickly without thinking about safety.  Almost pulled out IV x2 by moving before everything was ready.  Left RW at foot of bed and moved remaining distance to bed reaching for bed and rail.  Reviewed safety and importance of keeping AD close to her.  Unsure why, but patient laughing inappropriately throughout session.    Extremity/Trunk Assessment Upper Extremity Assessment Upper Extremity Assessment: Overall WFL for tasks assessed Lower Extremity Assessment Lower Extremity Assessment: Generalized weakness   Balance    End of Session PT - End of Session Equipment Utilized During Treatment: Gait belt Activity Tolerance: Patient tolerated treatment well Patient left: in bed;with call bell/phone within reach;with family/visitor present (sitting EOB with daughter) Nurse Communication: Mobility status (Encouraged patient to ambulate in hallway with nursing)  GP      Vena Austria 06/22/2013, 5:11 PM Durenda Hurt. Renaldo Fiddler, [facility] Acute Rehab Services Pager [phone]

## 2013-06-22 NOTE — Progress Notes (Signed)
NUTRITION FOLLOW UP  Intervention:   1.  Modify diet; per MD discretion to Regular goal 2.  Supplements;  Ensure Complete po BID, each supplement provides 350 kcal and 13 grams of protein.  Nutrition Dx:   Inadequate oral intake, ongoing  Monitor:   1.  Food/Beverage; pt meeting >/=90% estimated needs with tolerance. 2.  Wt/wt change; monitor trends.  Ongoing.  3.  Enteral nutrition; initiation with tolerance.  Pt to meet >/=90% estimated needs with nutrition support.  No longer appropriate.  Discontinue.     Assessment:   Pt admitted with AMS and respiratory distress after being found unresponsive at home. Pt is O2-dependent COPD. Pt has been extubated and diet advanced to Full Liquid. Pt transferring to 6N at time of visit.   Daughter previously reported pt consumed Boost daily at home.  Will continue supplement for pt as inpatient and continue to follow for diet advancement with adequate intake.    Height: Ht Readings from Last 1 Encounters:  06/20/13 5' 1.81" (1.57 m)    Weight Status:   Wt Readings from Last 1 Encounters:  06/22/13 115 lb 15.4 oz (52.6 kg)    Re-estimated needs:  Kcal: 1500-1700 Protein: 65-73g Fluid: >1.5 L/day  Skin: no issues noted, prophylactic sacral dressing in place  Diet Order: Full Liquid   Intake/Output Summary (Last 24 hours) at 06/22/13 1318 Last data filed at 06/22/13 1100  Gross per 24 hour  Intake  526.3 ml  Output   1315 ml  Net -788.7 ml    Last BM: 9/23   Labs:   Recent Labs Lab 06/20/13 0501 06/21/13 0502 06/22/13 0400  NA 138 141 143  K 4.9 3.9 3.7  CL 97 99 100  CO2 30 24 31   BUN 26* 43* 41*  CREATININE 1.64* 1.84* 1.08  CALCIUM 9.0 9.2 9.6  MG  --  2.1 1.9  PHOS  --  3.2 2.8  GLUCOSE 119* 164* 106*    CBG (last 3)   Recent Labs  06/21/13 2357 06/22/13 0419 06/22/13 0805  GLUCAP 109* 116* 98    Scheduled Meds: . ipratropium  0.5 mg Nebulization Q4H   And  . albuterol  2.5 mg Nebulization  Q4H  . antiseptic oral rinse  15 mL Mouth Rinse BID  . aspirin  325 mg Oral Daily  . heparin subcutaneous  5,000 Units Subcutaneous Q8H  . [START ON 06/23/2013] methylPREDNISolone (SOLU-MEDROL) injection  40 mg Intravenous Q24H  . pantoprazole (PROTONIX) IV  40 mg Intravenous Q24H    Continuous Infusions: . acetylcysteine 15 mg/kg/hr (06/21/13 2023)    Loyce Dys, MS RD LDN Clinical Inpatient Dietitian Pager: [phone] Weekend/After hours pager: [phone]

## 2013-06-22 NOTE — Progress Notes (Signed)
Chaplain followed up with patient. Patient expressed appreciation for visit from priest. Patient had two brothers in the [place] war - one did not return home and one was forever changed. Patient expressed struggle with forgiveness. She was appreciative of visit.  Maurene Capes, Chaplain

## 2013-06-22 NOTE — Progress Notes (Signed)
eLink Physician-Brief Progress Note Patient Name: Ciella Obi DOB: 10-01-1945 MRN: [id number]  Date of Service  06/22/2013   HPI/Events of Note   Rare pseudomonas in sputum  eICU Interventions  Complete levaquin x 7ds Await sens   Intervention Category Intermediate Interventions: Diagnostic test evaluation  ALVA,RAKESH V. 06/22/2013, 4:38 PM

## 2013-06-22 NOTE — Progress Notes (Signed)
PULMONARY  / CRITICAL CARE MEDICINE  Name: [name] MRN: [id number] DOB: April 02, 1946    ADMISSION DATE:  06/19/2013 CONSULTATION DATE:  06/19/2013  REFERRING MD :  EDP PRIMARY SERVICE:  PCCM  CHIEF COMPLAINT:  Altered mental status  BRIEF PATIENT DESCRIPTION: 67 yo with oxygen-dependent COPD, pulmonary hypertension, diastolic CHF, on Xanax, Fioricet, Codeine and Nortriptyline found at home unresponsive.  Upon arrival to ED ABG revealed respiratory acidosis. Tox screen positive for barbiturates, opioids and benzodiazepines. Placed on BiPAP and given narcan with some improvement of mental status, however hypercarbia worsened and emesis developed.  Patient intubated.   LINES / TUBES: OETT 9/23 >>>9/25 (planned) OGT 9/23 >>>9/25 (planned) Foley 9/23 >>>  CULTURES: 9/23 Blood >>> 9/23 Respiratory >>> 9/24 MRSA by PCR >>> negative 06/20/13 Resp Virus Multiplex PCR >>>Rhonovirus detected  ANTIBIOTICS: Levaquin 9/23 >>>9/26  SIG NIFICANT EVENTS / STUDIES 06/19/2013 >> ADMIT. Urint tox positive for barb, benzoe and opioids. Tylenol level < 15 06/20/13: Tylenol level < 15. On NAC  INTERVAL HISTORY: No distress, no NIMV  VITAL SIGNS: Temp:  [97 F (36.1 C)-98.6 F (37 C)] 97.8 F (36.6 C) (09/26 0800) Pulse Rate:  [32-101] 82 (09/26 0800) Resp:  [10-33] 18 (09/26 0900) BP: (104-155)/(43-79) 122/61 mmHg (09/26 0900) SpO2:  [83 %-100 %] 98 % (09/26 0836) FiO2 (%):  [40 %] 40 % (09/25 1546) Weight:  [52.6 kg (115 lb 15.4 oz)] 52.6 kg (115 lb 15.4 oz) (09/26 0500)  HEMODYNAMICS:   VENTILATOR SETTINGS: Vent Mode:  [-] BIPAP FiO2 (%):  [40 %] 40 % INTAKE / OUTPUT: Intake/Output     09/25 0701 - 09/26 0700 09/26 0701 - 09/27 0700   I.V. (mL/kg) 480.1 (9.1) 20.3 (0.4)   NG/GT 165    IV Piggyback 100    Total Intake(mL/kg) 745.1 (14.2) 20.3 (0.4)   Urine (mL/kg/hr) 1600 (1.3)    Total Output 1600     Net -854.9 +20.3         PHYSICAL EXAMINATION: General: Appears  chronically ill,no distress Neuro:  A&O x 3 HEENT:  PERRL, no icterus Cardiovascular:  RRR, no m/r/g Lungs:  Mild wheezing, clears with cough Abdomen:  Soft, nontender, +BS Musculoskeletal:  Moves all extremities, no edema Skin:  Intact  LABS: PULMONARY  Recent Labs Lab 06/19/13 1846 06/19/13 2046  PHART 7.257* 7.264*  PCO2ART 73.8* 80.9*  PO2ART 93.0 128.0*  HCO3 32.9* 36.6*  TCO2 35 39  O2SAT 95.0 98.0    CBC  Recent Labs Lab 06/20/13 0501 06/21/13 0502 06/22/13 0400  HGB 11.9* 12.1 13.0  HCT 36.7 35.9* 39.9  WBC 10.0 10.4 12.8*  PLT 172 163 181    COAGULATION  Recent Labs Lab 06/19/13 2318 06/20/13 1025 06/21/13 0502 06/22/13 0400  INR 1.43 1.60* 1.44 1.25    CARDIAC    Recent Labs Lab 06/19/13 2318 06/20/13 0501 06/20/13 1025 06/20/13 1357 06/20/13 2030  TROPONINI 0.47* 0.40* 0.46* 0.38* <0.30    Recent Labs Lab 06/19/13 1900 06/21/13 0502 06/22/13 0400  PROBNP 5267.0* 3048.0* 1715.0*     CHEMISTRY  Recent Labs Lab 06/19/13 2148 06/20/13 0305 06/20/13 0501 06/21/13 0502 06/22/13 0400  NA 140 144 138 141 143  K 6.1* 4.7 4.9 3.9 3.7  CL 99 101 97 99 100  CO2 31 30 30 24 31   GLUCOSE 98 147* 119* 164* 106*  BUN 19 23 26* 43* 41*  CREATININE 1.44* 1.34* 1.64* 1.84* 1.08  CALCIUM 8.9 8.5 9.0 9.2 9.6  MG  --   --   --  2.1 1.9  PHOS  --   --   --  3.2 2.8   Estimated Creatinine Clearance: 39.7 ml/min (by C-G formula based on Cr of 1.08).   LIVER  Recent Labs Lab 06/19/13 1900 06/19/13 2148 06/19/13 2318 06/20/13 0305 06/20/13 1025 06/21/13 0502 06/22/13 0400  AST 1163* 4544*  --  6758*  --  3092* 946*  ALT 1128* 4140*  --  4726*  --  4172* 2872*  ALKPHOS 261* 239*  --  188*  --  196* 183*  BILITOT 0.5 0.3  --  0.2*  --  0.1* 0.2*  PROT 8.1 7.4  --  6.2  --  6.6 6.8  ALBUMIN 3.4* 3.2*  --  2.5*  --  2.5* 2.6*  INR  --   --  1.43  --  1.60* 1.44 1.25     INFECTIOUS  Recent Labs Lab 06/20/13 0501  06/20/13 0940 06/20/13 1025 06/21/13 0502 06/22/13 0501  LATICACIDVEN 1.1  --  1.1 1.7  --   PROCALCITON  --  0.98  --  0.66 0.38     ENDOCRINE CBG (last 3)   Recent Labs  06/21/13 2357 06/22/13 0419 06/22/13 0805  GLUCAP 109* 116* 98    Dg Chest Port 1 View  06/21/2013   CLINICAL DATA:  Check endotracheal tube. Shortness of breath.  EXAM: PORTABLE CHEST - 1 VIEW  COMPARISON:  06/20/2013.  FINDINGS: Endotracheal tube ends in the mid thoracic trachea. An enteric tube crosses the diaphragm, with side port near the GE junction.  No change in mild cardiomegaly.  Patchy reticular nodular opacities are stable from priors, most dense at the right apex. No evidence of effusion or pneumothorax. Right axillary dissection.  IMPRESSION: 1. Stable positioning of enteric and endotracheal tubes. 2. Unchanged lung aeration.   Electronically Signed   By: Tiburcio Pea   On: 06/21/2013 05:23   Dg Chest Port 1 View  06/20/2013   CLINICAL DATA:  67 year old female intubated. Respiratory failure.  EXAM: PORTABLE CHEST - 1 VIEW  COMPARISON:  06/19/2013 and earlier.  FINDINGS: Portable AP semi upright view at at 1005 hrs. Stable endotracheal tube tip. Enteric tube now in place, courses to the abdomen. Multiple EKG leads and wires overlie the chest. Stable postoperative changes to the right chest wall and axilla. Stable lung volumes. Stable cardiac size and mediastinal contours. Mildly decreased interstitial opacity. No pneumothorax or consolidation. Possible small pleural effusions.  IMPRESSION: 1. Stable endotracheal tube. Enteric tube in place, courses to the left upper quadrant.  2. Mildly decreased vascular congestion with possible small pleural effusions. No other acute cardiopulmonary abnormality.   Electronically Signed   By: Augusto Gamble M.D.   On: 06/20/2013 10:36    pcxr none new  ASSESSMENT / PLAN:  PULMONARY A:  Acute respiratory failure in setting of suspected polysubstance overdose, possible  acute COPD exacerbation and possible aspiration.  Pulmonary hypertension (PAP 76 torr Feb 2013).  06/21/13: Meets extubation criteria (staff note)  P:   No bipap needed, dc Albuterol / Atrovent Solu-Medrol reduce further  CARDIOVASCULAR A: Diastolic CHF, doubt exacerbation would lead to hypercarbic failure. CAD. Stress ischemia at best Echo - 55%  P:  Continue ASA Hold Lipitor as elevated transaminases Echo reviewed, re assuring  RENAL A:  AKI improved  P:   Trend BMP NS@KVO   GASTROINTESTINAL A:  Acute liver failure. Highly suspect Tylenol toxicity  P:   NAC per MD driven, will continue until LFt resolve themselves Protonix  for GI Px Trend LFT again in am  Avoid hepatotoxic agents Diet add  HEMATOLOGIC A:  No active issues. P:  Trend CBC Heparin SQ, and SCD for dvt proph Ambulate as able, if able dc sub q hep  INFECTIOUS A:  AECOPD followiung  URI and exposure to sick contacts. Likely Virus rhono pos  P:   Empirical fluroquinolone dc PCT neg and low clinical suspicion  ENDOCRINE  A:  Hyperglycemia, mild P:   CBG q4h SSI  NEUROLOGIC A:  Acute encephalopathy at admit  06/21/13: Normal WUA sedation gtt turned off, pt tolerating well  P:   home Xanax HOld home  Fioricet, Nortriptyline PT consult  To  [facility], [facility]  Mcarthur Rossetti. Tyson Alias, MD, FACP Pgr: [phone] [facility]

## 2013-06-23 DIAGNOSIS — E875 Hyperkalemia: Secondary | ICD-10-CM

## 2013-06-23 DIAGNOSIS — R4182 Altered mental status, unspecified: Secondary | ICD-10-CM

## 2013-06-23 DIAGNOSIS — Z8719 Personal history of other diseases of the digestive system: Secondary | ICD-10-CM

## 2013-06-23 DIAGNOSIS — I5032 Chronic diastolic (congestive) heart failure: Secondary | ICD-10-CM

## 2013-06-23 DIAGNOSIS — I509 Heart failure, unspecified: Secondary | ICD-10-CM

## 2013-06-23 DIAGNOSIS — R7989 Other specified abnormal findings of blood chemistry: Secondary | ICD-10-CM

## 2013-06-23 DIAGNOSIS — Z853 Personal history of malignant neoplasm of breast: Secondary | ICD-10-CM

## 2013-06-23 HISTORY — DX: Personal history of other diseases of the digestive system: Z87.19

## 2013-06-23 LAB — CBC WITH DIFFERENTIAL/PLATELET
Basophils Absolute: 0 10*3/uL (ref 0.0–0.1)
Basophils Relative: 0 % (ref 0–1)
Eosinophils Absolute: 0 10*3/uL (ref 0.0–0.7)
Eosinophils Relative: 0 % (ref 0–5)
HCT: 39.5 % (ref 36.0–46.0)
Hemoglobin: 13.3 g/dL (ref 12.0–15.0)
Lymphocytes Relative: 14 % (ref 12–46)
Lymphs Abs: 1.1 10*3/uL (ref 0.7–4.0)
MCH: 32.4 pg (ref 26.0–34.0)
MCHC: 33.7 g/dL (ref 30.0–36.0)
MCV: 96.3 fL (ref 78.0–100.0)
Monocytes Absolute: 1 10*3/uL (ref 0.1–1.0)
Monocytes Relative: 13 % — ABNORMAL HIGH (ref 3–12)
Neutro Abs: 5.9 10*3/uL (ref 1.7–7.7)
Neutrophils Relative %: 73 % (ref 43–77)
Platelets: 185 10*3/uL (ref 150–400)
RBC: 4.1 MIL/uL (ref 3.87–5.11)
RDW: 13.6 % (ref 11.5–15.5)
WBC: 8.1 10*3/uL (ref 4.0–10.5)

## 2013-06-23 LAB — BASIC METABOLIC PANEL
BUN: 24 mg/dL — ABNORMAL HIGH (ref 6–23)
CO2: 37 mEq/L — ABNORMAL HIGH (ref 19–32)
Calcium: 9.5 mg/dL (ref 8.4–10.5)
Chloride: 98 mEq/L (ref 96–112)
Creatinine, Ser: 0.75 mg/dL (ref 0.50–1.10)
GFR calc Af Amer: >90 mL/min (ref >90)
GFR calc non Af Amer: 86 mL/min — ABNORMAL LOW (ref >90)
Glucose, Bld: 85 mg/dL (ref 70–99)
Potassium: 2.8 mEq/L — ABNORMAL LOW (ref 3.5–5.1)
Sodium: 142 mEq/L (ref 135–145)

## 2013-06-23 LAB — CULTURE, RESPIRATORY W GRAM STAIN

## 2013-06-23 LAB — HEPATIC FUNCTION PANEL
ALT: 1790 U/L — ABNORMAL HIGH (ref 0–35)
AST: 307 U/L — ABNORMAL HIGH (ref 0–37)
Albumin: 2.5 g/dL — ABNORMAL LOW (ref 3.5–5.2)
Alkaline Phosphatase: 158 U/L — ABNORMAL HIGH (ref 39–117)
Bilirubin, Direct: <0.1 mg/dL (ref 0.0–0.3)
Total Bilirubin: 0.2 mg/dL — ABNORMAL LOW (ref 0.3–1.2)
Total Protein: 6.4 g/dL (ref 6.0–8.3)

## 2013-06-23 LAB — COMPREHENSIVE METABOLIC PANEL
ALT: 1495 U/L — ABNORMAL HIGH (ref 0–35)
AST: 206 U/L — ABNORMAL HIGH (ref 0–37)
Albumin: 2.4 g/dL — ABNORMAL LOW (ref 3.5–5.2)
Alkaline Phosphatase: 151 U/L — ABNORMAL HIGH (ref 39–117)
BUN: 20 mg/dL (ref 6–23)
CO2: 33 mEq/L — ABNORMAL HIGH (ref 19–32)
Calcium: 9.2 mg/dL (ref 8.4–10.5)
Chloride: 96 mEq/L (ref 96–112)
Creatinine, Ser: 0.67 mg/dL (ref 0.50–1.10)
GFR calc Af Amer: >90 mL/min (ref >90)
GFR calc non Af Amer: 89 mL/min — ABNORMAL LOW (ref >90)
Glucose, Bld: 174 mg/dL — ABNORMAL HIGH (ref 70–99)
Potassium: 3.4 mEq/L — ABNORMAL LOW (ref 3.5–5.1)
Sodium: 139 mEq/L (ref 135–145)
Total Bilirubin: 0.3 mg/dL (ref 0.3–1.2)
Total Protein: 6.3 g/dL (ref 6.0–8.3)

## 2013-06-23 LAB — MAGNESIUM: Magnesium: 1.5 mg/dL (ref 1.5–2.5)

## 2013-06-23 LAB — PHOSPHORUS: Phosphorus: 1.9 mg/dL — ABNORMAL LOW (ref 2.3–4.6)

## 2013-06-23 LAB — GLUCOSE, CAPILLARY
Glucose-Capillary: 163 mg/dL — ABNORMAL HIGH (ref 70–99)
Glucose-Capillary: 189 mg/dL — ABNORMAL HIGH (ref 70–99)
Glucose-Capillary: 73 mg/dL (ref 70–99)
Glucose-Capillary: 89 mg/dL (ref 70–99)
Glucose-Capillary: 96 mg/dL (ref 70–99)

## 2013-06-23 LAB — CK: Total CK: 64 U/L (ref 7–177)

## 2013-06-23 LAB — POTASSIUM: Potassium: 2.8 mEq/L — ABNORMAL LOW (ref 3.5–5.1)

## 2013-06-23 MED ORDER — POTASSIUM CHLORIDE 10 MEQ/100ML IV SOLN
10.0000 meq | INTRAVENOUS | Status: AC
  Administered 2013-06-23 (×4): 10 meq via INTRAVENOUS
  Filled 2013-06-23 (×4): qty 100

## 2013-06-23 MED ORDER — IBUPROFEN 800 MG PO TABS
800.0000 mg | ORAL_TABLET | Freq: Three times a day (TID) | ORAL | Status: DC | PRN
  Administered 2013-06-23 – 2013-06-25 (×3): 800 mg via ORAL
  Filled 2013-06-23 (×3): qty 1

## 2013-06-23 MED ORDER — BISACODYL 5 MG PO TBEC: 10.0000 mg | DELAYED_RELEASE_TABLET | Freq: Three times a day (TID) | ORAL | Status: DC | PRN

## 2013-06-23 MED ORDER — LEVOFLOXACIN 750 MG PO TABS
750.0000 mg | ORAL_TABLET | Freq: Every day | ORAL | Status: DC
  Administered 2013-06-24 – 2013-06-25 (×2): 750 mg via ORAL
  Filled 2013-06-23 (×3): qty 1

## 2013-06-23 MED ORDER — POLYETHYLENE GLYCOL 3350 17 G PO PACK
17.0000 g | PACK | Freq: Two times a day (BID) | ORAL | Status: DC
  Administered 2013-06-23 – 2013-06-24 (×3): 17 g via ORAL
  Filled 2013-06-23 (×6): qty 1

## 2013-06-23 MED ORDER — BISACODYL 5 MG PO TBEC: 10.0000 mg | DELAYED_RELEASE_TABLET | Freq: Three times a day (TID) | ORAL | Status: DC

## 2013-06-23 MED ORDER — ALBUTEROL SULFATE (5 MG/ML) 0.5% IN NEBU: 2.5000 mg | INHALATION_SOLUTION | RESPIRATORY_TRACT | Status: DC | PRN

## 2013-06-23 MED ORDER — MAGNESIUM SULFATE 40 MG/ML IJ SOLN
2.0000 g | Freq: Once | INTRAMUSCULAR | Status: AC
  Administered 2013-06-23: 2 g via INTRAVENOUS
  Filled 2013-06-23: qty 50

## 2013-06-23 MED ORDER — BISACODYL 5 MG PO TBEC
10.0000 mg | DELAYED_RELEASE_TABLET | Freq: Two times a day (BID) | ORAL | Status: DC
  Administered 2013-06-23 – 2013-06-24 (×3): 10 mg via ORAL
  Filled 2013-06-23 (×6): qty 2

## 2013-06-23 MED ORDER — POLYETHYLENE GLYCOL 3350 17 G PO PACK: 17.0000 g | PACK | Freq: Two times a day (BID) | ORAL | Status: DC | PRN

## 2013-06-23 MED ORDER — IBUPROFEN 800 MG PO TABS: 800.0000 mg | ORAL_TABLET | Freq: Three times a day (TID) | ORAL | Status: DC | PRN

## 2013-06-23 NOTE — Progress Notes (Signed)
[facility] PROGRESS NOTE  [name] OZH:[id number] DOB: August 27, 1946 DOA: 06/19/2013 PCP: Elby Showers, MD  Assessment/Plan: 1: Acute respiratory failure in setting of suspected polysubstance overdose, possible acute COPD exacerbation and possible aspiration. Pulmonary hypertension (PAP 76 torr Feb 2013).  Trend ABG / CXR  Albuterol / Atrovent  Solu-Medrol 80 q8h  Flovent when able   2. Diastolic CHF, -TTE ; see results below -Continue ASA  -Hold Lipitor as elevated transaminases   3. AKI/ Hyperkalemia.   Resolved, continue to monitor daily  4. Acute liver failure. Highly suspect Tylenol toxicity (according to Pharmacy filled multiple prescriptions for Fioricet from multiple providers for the total of 250 tabs over last 10 days). Possible liver congestion in setting of severe pulmonary hypertension.  -Avoid hepatotoxic agents  -Continue Empirical acetylcysteine IV     5. acute exacerbation COPD;   - No overt infection. However continue Empirical flouroquinolone for AECOPD for total of 7 days -Continue Solu-Medrol started 9/27    6. Hypoglycemia in setting of liver dysfunction. Resolved -Continue CBG q. a.c./each bedtime    7. Acute encephalopathy. Resolved  8. Rebound migraine; patient has been taken multiple drugs daily for migraine (3-4 tabs Fioricet , nortriptyline as well as narcotics) -  Counseled patient will use Motrin and a small dose of fentanyl to take the edge off migraine   9. Constipation; start MiraLax and bisacodyl  10. Tobacco abuse; counseled patient strongly to stop(smokes 3/4 PPD x48 years)     Code Status: Full Disposition Plan:    Consultants:    Procedures:  Echocardiogram 06/20/2013 Left ventricle: The cavity size was normal. Wall thickness was normal. Systolic function was normal. -LVEF=  55% to 60%. -Incoordinate septal motion. - (grade 1 diastolic dysfunction). The E/e' ratio is <10, suggesting normal LV filling  pressure. - Aortic valve: Trivial regurgitation.  -Calcified aortic valve with restricted leaflet motion. mild aortic stenosis.  - Mitral valve: Mildly thickened leaflets . Mild regurgitation. - Right ventricle: The cavity size was mildly dilated. - Right atrium: The atrium was mildly dilated. - Tricuspid valve: The tricuspid annulus is dilated  - Inferior vena cava: The vessel was dilated; the respirophasic diameter changes were blunted (< 50%); findings are consistent with elevated central venous pressure - however, this may be in the setting of positive pressure ventilation. - Pericardium, extracardiac: There was no pericardial effusion.     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 Upon arrival to ED ABG revealed respiratory acidosis. Tox screen positive for barbiturates, opioids and benzodiazepines. Placed on BiPAP and given narcan with some improvement of mental status, however hypercarbia worsened and emesis developed. Patient intubated. TODAY has had daily migraines for which she was taking 3-4 Fioricet tablets a day over many years. States having migraines which are temporarily treated with that no but then return. States also has been taking lactulose for years secondary to chronic constipation (feels constipated now)   Objective: Filed Vitals:   06/22/13 2351 06/23/13 0228 06/23/13 0319 06/23/13 0646  BP:  123/64  144/74  Pulse:  87  100  Temp:  98 F (36.7 C)  97.5 F (36.4 C)  TempSrc:  Oral  Oral  Resp:  18  18  Height:      Weight:    56.019  kg (123 lb 8 oz)  SpO2: 94% 99% 97% 96%    Intake/Output Summary (Last 24 hours) at 06/23/13 0759 Last data filed at 06/23/13 0700  Gross per 24 hour  Intake  618.2 ml  Output   3900 ml  Net -3281.8 ml   Filed Weights   06/21/13 0600 06/22/13 0500 06/23/13 0646  Weight: 53.8 kg (118 lb 9.7 oz) 52.6 kg (115 lb 15.4 oz) 56.019 kg (123 lb 8 oz)    Exam:   General:  A./O. X4, NAD  Cardiovascular: Regular rhythm and rate, negative murmurs rubs gallops, DP/PT pulse 2+ bilateral  Respiratory: Inspiratory and expiratory wheezing diffusely, poor air movement bibasilar  Abdomen: Soft nontender nondistended plus bowel sounds  Musculoskeletal: Negative pedal edema   Data Reviewed: Basic Metabolic Panel:  Recent Labs Lab 06/20/13 0305 06/20/13 0501 06/21/13 0502 06/22/13 0400 06/23/13 0505  NA 144 138 141 143 142  K 4.7 4.9 3.9 3.7 2.8*  CL 101 97 99 100 98  CO2 30 30 24 31  37*  GLUCOSE 147* 119* 164* 106* 85  BUN 23 26* 43* 41* 24*  CREATININE 1.34* 1.64* 1.84* 1.08 0.75  CALCIUM 8.5 9.0 9.2 9.6 9.5  MG  --   --  2.1 1.9 1.5  PHOS  --   --  3.2 2.8 1.9*   Liver Function Tests:  Recent Labs Lab 06/19/13 2148 06/20/13 0305 06/21/13 0502 06/22/13 0400 06/23/13 0505  AST 4544* 6758* 3092* 946* 307*  ALT 4140* 4726* 4172* 2872* 1790*  ALKPHOS 239* 188* 196* 183* 158*  BILITOT 0.3 0.2* 0.1* 0.2* 0.2*  PROT 7.4 6.2 6.6 6.8 6.4  ALBUMIN 3.2* 2.5* 2.5* 2.6* 2.5*   No results found for this basename: LIPASE, AMYLASE,  in the last 168 hours  Recent Labs Lab 06/19/13 2318 06/20/13 1553  AMMONIA 91* 47   CBC:  Recent Labs Lab 06/19/13 1900 06/20/13 0501 06/21/13 0502 06/22/13 0400 06/23/13 0505  WBC 7.8 10.0 10.4 12.8* 8.1  NEUTROABS 6.4  --  9.6* 11.1* 5.9  HGB 15.1* 11.9* 12.1 13.0 13.3  HCT 46.1* 36.7 35.9* 39.9 39.5  MCV 101.1* 98.9 95.7 97.1 96.3  PLT 234 172 163 181 185   Cardiac Enzymes:  Recent Labs Lab 06/19/13 2318 06/20/13 0501  06/20/13 1025 06/20/13 1357 06/20/13 2030 06/22/13 0400  CKTOTAL  --   --   --   --   --  54  TROPONINI 0.47* 0.40* 0.46* 0.38* <0.30  --    BNP (last 3 results)  Recent Labs  06/19/13 1900 06/21/13 0502 06/22/13 0400  PROBNP 5267.0* 3048.0* 1715.0*   CBG:  Recent Labs Lab 06/22/13 1659 06/22/13 2006 06/23/13 0009 06/23/13 0405 06/23/13 0738  GLUCAP 115* 98 96 89 73    Recent Results (from the past 240 hour(s))  CULTURE, BLOOD (ROUTINE X 2)     Status: None   Collection Time    06/19/13  6:45 PM      Result Value Range Status   Specimen Description BLOOD ARM LEFT   Final   Special Requests BOTTLES DRAWN AEROBIC ONLY 2CC   Final   Culture  Setup Time     Final   Value: 06/20/2013 01:48     Performed at [facility]   Culture     Final   Value:        BLOOD CULTURE RECEIVED  NO GROWTH TO DATE CULTURE WILL BE HELD FOR 5 DAYS BEFORE ISSUING A FINAL NEGATIVE REPORT     Performed at [facility]   Report Status PENDING   Incomplete  CULTURE, BLOOD (ROUTINE X 2)     Status: None   Collection Time    06/19/13  7:00 PM      Result Value Range Status   Specimen Description BLOOD HAND RIGHT   Final   Special Requests BOTTLES DRAWN AEROBIC ONLY 3CC   Final   Culture  Setup Time     Final   Value: 06/20/2013 01:48     Performed at [facility]   Culture     Final   Value:        BLOOD CULTURE RECEIVED NO GROWTH TO DATE CULTURE WILL BE HELD FOR 5 DAYS BEFORE ISSUING A FINAL NEGATIVE REPORT     Performed at [facility]   Report Status PENDING   Incomplete  CULTURE, RESPIRATORY (NON-EXPECTORATED)     Status: None   Collection Time    06/19/13 11:07 PM      Result Value Range Status   Specimen Description TRACHEAL ASPIRATE   Final   Special Requests NONE   Final   Gram Stain     Final   Value: FEW WBC PRESENT,BOTH PMN AND MONONUCLEAR     NO SQUAMOUS EPITHELIAL CELLS SEEN     NO ORGANISMS SEEN     Performed at [facility]    Culture     Final   Value: RARE PSEUDOMONAS AERUGINOSA     Performed at [facility]   Report Status PENDING   Incomplete  MRSA PCR SCREENING     Status: None   Collection Time    06/20/13  7:18 AM      Result Value Range Status   MRSA by PCR NEGATIVE  NEGATIVE Final   Comment:            The GeneXpert MRSA Assay (FDA     approved for NASAL specimens     only), is one component of a     comprehensive MRSA colonization     surveillance program. It is not     intended to diagnose MRSA     infection nor to guide or     monitor treatment for     MRSA infections.  RESPIRATORY VIRUS PANEL     Status: Abnormal   Collection Time    06/20/13 11:08 AM      Result Value Range Status   Source - RVPAN Not Provided   Final   Respiratory Syncytial Virus A NOT DETECTED   Final   Respiratory Syncytial Virus B NOT DETECTED   Final   Influenza A NOT DETECTED   Final   Influenza B NOT DETECTED   Final   Parainfluenza 1 NOT DETECTED   Final   Parainfluenza 2 NOT DETECTED   Final   Parainfluenza 3 NOT DETECTED   Final   Metapneumovirus NOT DETECTED   Final   Rhinovirus DETECTED (*)  Final   Adenovirus NOT DETECTED   Final   Influenza A H1 NOT DETECTED   Final   Influenza A H3 NOT DETECTED   Final   Comment: (NOTE)           Normal Reference Range for each Analyte: NOT DETECTED     Testing performed using the Lum[employer] xTAG Respiratory Viral Panel test  kit.     This test was developed and its performance characteristics determined     by [facility]. It has not been cleared or approved by the [place]     Food and Drug Administration. This test is used for clinical purposes.     It should not be regarded as investigational or for research. This     laboratory is certified under the Clinical Laboratory Improvement     Amendments of 1988 (CLIA) as qualified to perform high complexity     clinical laboratory testing.     Performed at [facility]  CULTURE, RESPIRATORY  (NON-EXPECTORATED)     Status: None   Collection Time    06/20/13 12:21 PM      Result Value Range Status   Specimen Description TRACHEAL ASPIRATE   Final   Special Requests NONE   Final   Gram Stain     Final   Value: ABUNDANT WBC PRESENT, PREDOMINANTLY PMN     NO SQUAMOUS EPITHELIAL CELLS SEEN     NO ORGANISMS SEEN     Performed at [facility]   Culture     Final   Value: NO GROWTH 2 DAYS     Performed at [facility]   Report Status 06/22/2013 FINAL   Final     Studies: No results found.  Scheduled Meds: . ipratropium  0.5 mg Nebulization Q4H   And  . albuterol  2.5 mg Nebulization Q4H  . antiseptic oral rinse  15 mL Mouth Rinse BID  . aspirin  325 mg Oral Daily  . feeding supplement  237 mL Oral BID BM  . heparin subcutaneous  5,000 Units Subcutaneous Q8H  . levofloxacin  750 mg Oral Q48H  . magnesium sulfate 1 - 4 g bolus IVPB  2 g Intravenous Once  . methylPREDNISolone (SOLU-MEDROL) injection  40 mg Intravenous Q24H  . pantoprazole (PROTONIX) IV  40 mg Intravenous Q24H  . potassium chloride  10 mEq Intravenous Q1 Hr x 4   Continuous Infusions: . acetylcysteine 15 mg/kg/hr (06/21/13 2023)    Principal Problem:   Acute respiratory failure Active Problems:   Acute renal failure   Acute liver failure   Tylenol toxicity    Time spent: 45 minutes    Rosalea Withrow, J  [facility] Pager [phone]. If 7PM-7AM, please contact night-coverage at [email or web address], password [facility] 06/23/2013, 7:59 AM  LOS: 4 days

## 2013-06-24 DIAGNOSIS — F1920 Other psychoactive substance dependence, uncomplicated: Secondary | ICD-10-CM

## 2013-06-24 DIAGNOSIS — F17200 Nicotine dependence, unspecified, uncomplicated: Secondary | ICD-10-CM

## 2013-06-24 DIAGNOSIS — E871 Hypo-osmolality and hyponatremia: Secondary | ICD-10-CM

## 2013-06-24 LAB — CBC
HCT: 39 % (ref 36.0–46.0)
Hemoglobin: 12.6 g/dL (ref 12.0–15.0)
MCH: 31.7 pg (ref 26.0–34.0)
MCHC: 32.3 g/dL (ref 30.0–36.0)
MCV: 98.2 fL (ref 78.0–100.0)
Platelets: 180 10*3/uL (ref 150–400)
RBC: 3.97 MIL/uL (ref 3.87–5.11)
RDW: 13.6 % (ref 11.5–15.5)
WBC: 6.8 10*3/uL (ref 4.0–10.5)

## 2013-06-24 LAB — COMPREHENSIVE METABOLIC PANEL
ALT: 1062 U/L — ABNORMAL HIGH (ref 0–35)
AST: 124 U/L — ABNORMAL HIGH (ref 0–37)
Albumin: 2.2 g/dL — ABNORMAL LOW (ref 3.5–5.2)
Alkaline Phosphatase: 123 U/L — ABNORMAL HIGH (ref 39–117)
BUN: 18 mg/dL (ref 6–23)
CO2: 37 mEq/L — ABNORMAL HIGH (ref 19–32)
Calcium: 9 mg/dL (ref 8.4–10.5)
Chloride: 99 mEq/L (ref 96–112)
Creatinine, Ser: 0.65 mg/dL (ref 0.50–1.10)
GFR calc Af Amer: >90 mL/min (ref >90)
GFR calc non Af Amer: 90 mL/min — ABNORMAL LOW (ref >90)
Glucose, Bld: 103 mg/dL — ABNORMAL HIGH (ref 70–99)
Potassium: 3.4 mEq/L — ABNORMAL LOW (ref 3.5–5.1)
Sodium: 142 mEq/L (ref 135–145)
Total Bilirubin: 0.2 mg/dL — ABNORMAL LOW (ref 0.3–1.2)
Total Protein: 5.7 g/dL — ABNORMAL LOW (ref 6.0–8.3)

## 2013-06-24 LAB — CBC WITH DIFFERENTIAL/PLATELET
Basophils Absolute: 0.2 10*3/uL — ABNORMAL HIGH (ref 0.0–0.1)
Basophils Relative: 0 % (ref 0–1)
Eosinophils Absolute: 1.2 10*3/uL — ABNORMAL HIGH (ref 0.0–0.7)
Eosinophils Relative: 1 % (ref 0–5)
HCT: 36.8 % (ref 36.0–46.0)
Hemoglobin: 12.2 g/dL (ref 12.0–15.0)
Lymphocytes Relative: 17 % (ref 12–46)
Lymphs Abs: 17.2 10*3/uL — ABNORMAL HIGH (ref 0.7–4.0)
MCH: 32.3 pg (ref 26.0–34.0)
MCHC: 33.2 g/dL (ref 30.0–36.0)
MCV: 97.4 fL (ref 78.0–100.0)
Monocytes Absolute: 14.6 10*3/uL — ABNORMAL HIGH (ref 0.1–1.0)
Monocytes Relative: 15 % — ABNORMAL HIGH (ref 3–12)
Neutro Abs: 66.8 10*3/uL — ABNORMAL HIGH (ref 1.7–7.7)
Neutrophils Relative %: 67 % (ref 43–77)
Platelets: DECREASED 10*3/uL (ref 150–400)
RBC: 3.78 MIL/uL — ABNORMAL LOW (ref 3.87–5.11)
RDW: 13.9 % (ref 11.5–15.5)
Smear Review: DECREASED
WBC: 6.5 10*3/uL (ref 4.0–10.5)

## 2013-06-24 LAB — GLUCOSE, CAPILLARY
Glucose-Capillary: 101 mg/dL — ABNORMAL HIGH (ref 70–99)
Glucose-Capillary: 83 mg/dL (ref 70–99)
Glucose-Capillary: 99 mg/dL (ref 70–99)

## 2013-06-24 LAB — PHOSPHORUS: Phosphorus: 3 mg/dL (ref 2.3–4.6)

## 2013-06-24 LAB — MAGNESIUM: Magnesium: 1.9 mg/dL (ref 1.5–2.5)

## 2013-06-24 LAB — CK: Total CK: 47 U/L (ref 7–177)

## 2013-06-24 MED ORDER — ALBUTEROL SULFATE (5 MG/ML) 0.5% IN NEBU: 2.5000 mg | INHALATION_SOLUTION | RESPIRATORY_TRACT | Status: DC | PRN

## 2013-06-24 MED ORDER — DM-GUAIFENESIN ER 30-600 MG PO TB12
1.0000 | ORAL_TABLET | Freq: Two times a day (BID) | ORAL | Status: DC
  Administered 2013-06-24: 1 via ORAL
  Filled 2013-06-24 (×3): qty 1

## 2013-06-24 MED ORDER — FLUTICASONE PROPIONATE HFA 220 MCG/ACT IN AERO
2.0000 | INHALATION_SPRAY | Freq: Two times a day (BID) | RESPIRATORY_TRACT | Status: DC
  Administered 2013-06-25: 2 via RESPIRATORY_TRACT
  Filled 2013-06-24: qty 12

## 2013-06-24 MED ORDER — SODIUM CHLORIDE 0.9 % IV SOLN
INTRAVENOUS | Status: DC
  Administered 2013-06-24: 13:00:00 via INTRAVENOUS

## 2013-06-24 NOTE — Progress Notes (Signed)
Pharmacy Note: N-Acetylcysteine for APAP Toxicity Follow-up  This patient was discussed with poison control in detail this morning ([phone]) regarding the futility of continuing Acetadote until the LFTs normalize. Per poison control as long as the APAP levels are undetectable, INR is low/wnl, and LFTs are trending down -- it is safe to discontinue. Per their protocol, the drip could have been discontinued on Fri, 9/26.   The patient's LFTs have trending down from 4544/4140 on admit to 124/1062 today, INR on 9/26 was 1.25, the patient's AMS has resolved and she is not currently having any symptoms of N/V.  This information was discussed with Dr. Joseph Art and the decision was made to discontinue the Acetadote today and switch this patient over to NS for IVF.  Pharmacy will sign off of protocol.   Georgina Pillion, PharmD, BCPS Clinical Pharmacist Pager: [phone] 06/24/2013 12:04 PM

## 2013-06-24 NOTE — Progress Notes (Signed)
[facility] PROGRESS NOTE  Tarisa Paola ZOX:[id number] DOB: October 22, 1945 DOA: 06/19/2013 PCP: Elby Showers, MD  Assessment/Plan: 1: Acute respiratory failure in setting of suspected polysubstance overdose, possible acute COPD exacerbation and possible aspiration. Pulmonary hypertension (PAP 76 torr Feb 2013).  -Albuterol / Atrovent  -Continue Solu-Medrol 40mg  q24 hr  -Start Flovent   -Added Mucinex  -We'll discharge on Advair and Spiriva  2. Diastolic CHF, -TTE ; see results below -Continue ASA  -Hold Lipitor as elevated transaminases   3. AKI/ Hyperkalemia.   Resolved, continue to monitor daily  4. Acute liver failure. Highly suspect Tylenol toxicity (according to Pharmacy filled multiple prescriptions for Fioricet from multiple providers for the total of 250 tabs over last 10 days). Possible liver congestion in setting of severe pulmonary hypertension.  -Avoid hepatotoxic agents  -D/C Acetylcysteine IV, and start normal saline 142ml/hr    5. acute exacerbation COPD;   - No overt infection. However continue Empirical flouroquinolone for AECOPD for total of 7 days -Continue Solu-Medrol started 9/27    6. Hypoglycemia in setting of liver dysfunction. Resolved -Continue CBG q. a.c./each bedtime    7. Acute encephalopathy. Resolved  8. Rebound migraine; patient has been taken multiple drugs daily for migraine (3-4 tabs Fioricet , nortriptyline as well as narcotics) -  Counseled patient will use Motrin and a small dose of fentanyl to take the edge off migraine; no complaints today of migraine   9. Constipation; start MiraLax and bisacodyl (had BM today)  10. Tobacco abuse; counseled patient strongly to stop (smokes 3/4 PPD x48 years)     Code Status: Full Disposition Plan:    Consultants:    Procedures:  Echocardiogram 06/20/2013 Left ventricle: The cavity size was normal. Wall thickness was normal. Systolic function was normal. -LVEF=  55% to  60%. -Incoordinate septal motion. - (grade 1 diastolic dysfunction). The E/e' ratio is <10, suggesting normal LV filling pressure. - Aortic valve: Trivial regurgitation.  -Calcified aortic valve with restricted leaflet motion. mild aortic stenosis.  - Mitral valve: Mildly thickened leaflets . Mild regurgitation. - Right ventricle: The cavity size was mildly dilated. - Right atrium: The atrium was mildly dilated. - Tricuspid valve: The tricuspid annulus is dilated  - Inferior vena cava: The vessel was dilated; the respirophasic diameter changes were blunted (< 50%); findings are consistent with elevated central venous pressure - however, this may be in the setting of positive pressure ventilation. - Pericardium, extracardiac: There was no pericardial effusion.     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 Upon arrival to ED ABG revealed respiratory acidosis. Tox screen positive for barbiturates, opioids and benzodiazepines. Placed on BiPAP and given narcan with some improvement of mental status, however hypercarbia worsened and emesis developed. Patient intubated. 06/23/2013 has had daily migraines for which she was taking 3-4 Fioricet tablets a day over many years. States having migraines which are temporarily treated with that no but then return. States also has been taking lactulose for years secondary to chronic constipation (feels constipated now) TODAY states feeling much better request no one she can be discharged. One complaint is that she was having an asthma  attack respiratory refused to give her  breathing treatment early. Patient had used own home rescue inhalers.   Objective: Filed Vitals:   06/23/13 2022 06/23/13 2204 06/24/13 0554 06/24/13 1220  BP:  122/78 113/65   Pulse:  76 51   Temp:  98.3 F (36.8 C) 97.7 F (36.5 C)   TempSrc:  Oral Oral   Resp:  18 18   Height:      Weight:   59.5 kg (131 lb 2.8 oz)   SpO2: 96% 98% 98% 98%   No intake or output data in the 24 hours ending 06/24/13 1527 Filed Weights   06/22/13 0500 06/23/13 0646 06/24/13 0554  Weight: 52.6 kg (115 lb 15.4 oz) 56.019 kg (123 lb 8 oz) 59.5 kg (131 lb 2.8 oz)    Exam:   General:  A./O. X4, NAD  Cardiovascular: Regular rhythm and rate, negative murmurs rubs gallops, DP/PT pulse 2+ bilateral  Respiratory: Inspiratory and expiratory wheezing diffusely, poor air movement bibasilar, rhonchi  Abdomen: Soft nontender nondistended plus bowel sounds  Musculoskeletal: Negative pedal edema   Data Reviewed: Basic Metabolic Panel:  Recent Labs Lab 06/20/13 0501 06/21/13 0502 06/22/13 0400 06/23/13 0505 06/23/13 1530 06/24/13 0441  NA 138 141 143 142 139 142  K 4.9 3.9 3.7 2.8*  2.8* 3.4* 3.4*  CL 97 99 100 98 96 99  CO2 30 24 31  37* 33* 37*  GLUCOSE 119* 164* 106* 85 174* 103*  BUN 26* 43* 41* 24* 20 18  CREATININE 1.64* 1.84* 1.08 0.75 0.67 0.65  CALCIUM 9.0 9.2 9.6 9.5 9.2 9.0  MG  --  2.1 1.9 1.5  --  1.9  PHOS  --  3.2 2.8 1.9*  --  3.0   Liver Function Tests:  Recent Labs Lab 06/21/13 0502 06/22/13 0400 06/23/13 0505 06/23/13 1530 06/24/13 0441  AST 3092* 946* 307* 206* 124*  ALT 4172* 2872* 1790* 1495* 1062*  ALKPHOS 196* 183* 158* 151* 123*  BILITOT 0.1* 0.2* 0.2* 0.3 0.2*  PROT 6.6 6.8 6.4 6.3 5.7*  ALBUMIN 2.5* 2.6* 2.5* 2.4* 2.2*   No results found for this basename: LIPASE, AMYLASE,  in the last 168 hours  Recent Labs Lab 06/19/13 2318 06/20/13 1553  AMMONIA 91* 47   CBC:  Recent Labs Lab 06/19/13 1900  06/20/13 0501 06/21/13 0502 06/22/13 0400 06/23/13 0505 06/24/13 0441  WBC 7.8 10.0 10.4 12.8* 8.1 6.5  NEUTROABS 6.4  --  9.6* 11.1* 5.9 66.8*  HGB 15.1* 11.9* 12.1 13.0 13.3 12.2  HCT 46.1* 36.7 35.9* 39.9 39.5 36.8  MCV 101.1* 98.9 95.7 97.1 96.3 97.4  PLT 234 172 163 181 185 PLATELET CLUMPS NOTED ON SMEAR, COUNT APPEARS DECREASED   Cardiac Enzymes:  Recent Labs Lab 06/19/13 2318 06/20/13 0501 06/20/13 1025 06/20/13 1357 06/20/13 2030 06/22/13 0400 06/23/13 1530 06/24/13 0441  CKTOTAL  --   --   --   --   --  54 64 47  TROPONINI 0.47* 0.40* 0.46* 0.38* <0.30  --   --   --    BNP (last 3 results)  Recent Labs  06/19/13 1900 06/21/13 0502 06/22/13 0400  PROBNP 5267.0* 3048.0* 1715.0*   CBG:  Recent Labs Lab 06/23/13 0405 06/23/13 0738 06/23/13 1232 06/23/13 1645 06/24/13 1145  GLUCAP 89 73 189* 163* 101*    Recent Results (from the past 240 hour(s))  CULTURE, BLOOD (ROUTINE X 2)     Status: None   Collection Time    06/19/13  6:45 PM  Result Value Range Status   Specimen Description BLOOD ARM LEFT   Final   Special Requests BOTTLES DRAWN AEROBIC ONLY 2CC   Final   Culture  Setup Time     Final   Value: 06/20/2013 01:48     Performed at [facility]   Culture     Final   Value:        BLOOD CULTURE RECEIVED NO GROWTH TO DATE CULTURE WILL BE HELD FOR 5 DAYS BEFORE ISSUING A FINAL NEGATIVE REPORT     Performed at [facility]   Report Status PENDING   Incomplete  CULTURE, BLOOD (ROUTINE X 2)     Status: None   Collection Time    06/19/13  7:00 PM      Result Value Range Status   Specimen Description BLOOD HAND RIGHT   Final   Special Requests BOTTLES DRAWN AEROBIC ONLY 3CC   Final   Culture  Setup Time     Final   Value: 06/20/2013 01:48     Performed at [facility]   Culture     Final   Value:        BLOOD CULTURE RECEIVED NO GROWTH TO DATE CULTURE WILL BE HELD FOR 5 DAYS BEFORE ISSUING A FINAL NEGATIVE REPORT      Performed at [facility]   Report Status PENDING   Incomplete  CULTURE, RESPIRATORY (NON-EXPECTORATED)     Status: None   Collection Time    06/19/13 11:07 PM      Result Value Range Status   Specimen Description TRACHEAL ASPIRATE   Final   Special Requests NONE   Final   Gram Stain     Final   Value: FEW WBC PRESENT,BOTH PMN AND MONONUCLEAR     NO SQUAMOUS EPITHELIAL CELLS SEEN     NO ORGANISMS SEEN   Culture RARE PSEUDOMONAS AERUGINOSA   Final   Report Status 06/23/2013 FINAL   Final   Organism ID, Bacteria PSEUDOMONAS AERUGINOSA   Final  MRSA PCR SCREENING     Status: None   Collection Time    06/20/13  7:18 AM      Result Value Range Status   MRSA by PCR NEGATIVE  NEGATIVE Final   Comment:            The GeneXpert MRSA Assay (FDA     approved for NASAL specimens     only), is one component of a     comprehensive MRSA colonization     surveillance program. It is not     intended to diagnose MRSA     infection nor to guide or     monitor treatment for     MRSA infections.  RESPIRATORY VIRUS PANEL     Status: Abnormal   Collection Time    06/20/13 11:08 AM      Result Value Range Status   Source - RVPAN Not Provided   Final   Respiratory Syncytial Virus A NOT DETECTED   Final   Respiratory Syncytial Virus B NOT DETECTED   Final   Influenza A NOT DETECTED   Final   Influenza B NOT DETECTED   Final   Parainfluenza 1 NOT DETECTED   Final   Parainfluenza 2 NOT DETECTED   Final   Parainfluenza 3 NOT DETECTED   Final   Metapneumovirus NOT DETECTED   Final   Rhinovirus DETECTED (*)  Final   Adenovirus NOT DETECTED   Final  Influenza A H1 NOT DETECTED   Final   Influenza A H3 NOT DETECTED   Final   Comment: (NOTE)           Normal Reference Range for each Analyte: NOT DETECTED     Testing performed using the Luminex xTAG Respiratory Viral Panel test     kit.     This test was developed and its performance characteristics determined     by [facility].  It has not been cleared or approved by the [place]     Food and Drug Administration. This test is used for clinical purposes.     It should not be regarded as investigational or for research. This     laboratory is certified under the Clinical Laboratory Improvement     Amendments of 1988 (CLIA) as qualified to perform high complexity     clinical laboratory testing.     Performed at [facility]  CULTURE, RESPIRATORY (NON-EXPECTORATED)     Status: None   Collection Time    06/20/13 12:21 PM      Result Value Range Status   Specimen Description TRACHEAL ASPIRATE   Final   Special Requests NONE   Final   Gram Stain     Final   Value: ABUNDANT WBC PRESENT, PREDOMINANTLY PMN     NO SQUAMOUS EPITHELIAL CELLS SEEN     NO ORGANISMS SEEN     Performed at [facility]   Culture     Final   Value: NO GROWTH 2 DAYS     Performed at [facility]   Report Status 06/22/2013 FINAL   Final     Studies: No results found.  Scheduled Meds: . ipratropium  0.5 mg Nebulization Q4H   And  . albuterol  2.5 mg Nebulization Q4H  . antiseptic oral rinse  15 mL Mouth Rinse BID  . aspirin  325 mg Oral Daily  . bisacodyl  10 mg Oral BID  . feeding supplement  237 mL Oral BID BM  . heparin subcutaneous  5,000 Units Subcutaneous Q8H  . levofloxacin  750 mg Oral Daily  . methylPREDNISolone (SOLU-MEDROL) injection  40 mg Intravenous Q24H  . pantoprazole (PROTONIX) IV  40 mg Intravenous Q24H  . polyethylene glycol  17 g Oral BID   Continuous Infusions: . sodium chloride 100 mL/hr at 06/24/13 1322    Principal Problem:   Acute respiratory failure Active Problems:   COPD (chronic obstructive pulmonary disease)   Tobacco abuse   Acute renal failure   Acute liver failure   Tylenol toxicity   History of breast cancer in female   History of bowel infarction   Drug abuse and dependence    Time spent: 40 minutes    Kharter Brew, J  [facility] Pager [phone]. If  7PM-7AM, please contact night-coverage at [email or web address], password [facility] 06/24/2013, 3:27 PM  LOS: 5 days

## 2013-06-24 NOTE — Progress Notes (Signed)
Requested to restart PIV due to routine restart protocol/  Current PIV working well without c/o or redness.  Pt states is for possible d/c to home tomorrow.  Pt refuses restart due to d/c in am.  No ADN

## 2013-06-24 NOTE — Progress Notes (Signed)
Physical Therapy Treatment Patient Details Name: [name]: March 24, 1946 Today's Date: 06/24/2013 Time: 4098-1191 PT Time Calculation (min): 18 min  PT Assessment / Plan / Recommendation  History of Present Illness 67 yo with oxygen-dependent COPD, pulmonary hypertension, diastolic CHF, on Xanax, Fioricet, Codeine and Nortriptyline found at home unresponsive.  Upon arrival to ED ABG revealed respiratory acidosis. Tox screen positive for barbiturates, opioids and benzodiazepines. Placed on BiPAP and given narcan with some improvement of mental status, however hypercarbia worsened and emesis developed.  Patient intubated.   Patient extubated 06/21/13.   PT Comments   Overall progressing well with functional mobility, including managing a flight of steps; Will consider working without a RW for next session, although the RW is beneficial to decr work of walking  Of note, session conducted on [employer], and pt did desat to low 80s; restarted O2 at 3 L and O2 sats increased to acceptable levels; RN notified; pt left on 2 L o2  Follow Up Recommendations  Supervision/Assistance - 24 hour;No PT follow up     Does the patient have the potential to tolerate intense rehabilitation     Barriers to Discharge        Equipment Recommendations  None recommended by PT    Recommendations for Other Services    Frequency Min 3X/week   Progress towards PT Goals Progress towards PT goals: Progressing toward goals  Plan Current plan remains appropriate    Precautions / Restrictions Precautions Precautions: Fall Restrictions Weight Bearing Restrictions: No   Pertinent Vitals/Pain See comments    Mobility  Bed Mobility Bed Mobility: Supine to Sit;Sitting - Scoot to Edge of Bed Supine to Sit: HOB elevated;6: Modified independent (Device/Increase time) Sitting - Scoot to Edge of Bed: 6: Modified independent (Device/Increase time) Details for Bed Mobility Assistance: much better  attention to lines Transfers Transfers: Sit to Stand;Stand to Sit Sit to Stand: With upper extremity assist;From bed;5: Supervision Stand to Sit: With upper extremity assist;To bed;5: Supervision Details for Transfer Assistance: much better awareness of equipment; still moves quickly, though, but smooth motion with sit<>stand Ambulation/Gait Ambulation/Gait Assistance: 5: Supervision Ambulation Distance (Feet): 120 Feet Assistive device: Rolling walker Ambulation/Gait Assistance Details: Cues to self-monitor fro activity tolerance Gait Pattern: Step-through pattern Stairs: Yes Stairs Assistance: 4: Min guard Stair Management Technique: One rail Right;Forwards Number of Stairs: 12 (reciprocally ascending, step-to descending)    Exercises     PT Diagnosis:    PT Problem List:   PT Treatment Interventions:     PT Goals (current goals can now be found in the care plan section) Acute Rehab PT Goals Patient Stated Goal: To return home PT Goal Formulation: With patient/family Time For Goal Achievement: 06/29/13 Potential to Achieve Goals: Good  Visit Information  Last PT Received On: 06/24/13 Assistance Needed: +1 History of Present Illness: 67 yo with oxygen-dependent COPD, pulmonary hypertension, diastolic CHF, on Xanax, Fioricet, Codeine and Nortriptyline found at home unresponsive.  Upon arrival to ED ABG revealed respiratory acidosis. Tox screen positive for barbiturates, opioids and benzodiazepines. Placed on BiPAP and given narcan with some improvement of mental status, however hypercarbia worsened and emesis developed.  Patient intubated.   Patient extubated 06/21/13.    Subjective Data  Subjective: Seemed pleased with her ability to go up/down steps Patient Stated Goal: To return home   Cognition  Cognition Arousal/Alertness: Awake/alert Behavior During Therapy: Impulsive Overall Cognitive Status: History of cognitive impairments - at baseline Area of Impairment:  Attention;Safety/judgement Current Attention  Level: Selective (Very easily distracted) Safety/Judgement: Decreased awareness of safety    Balance     End of Session PT - End of Session Equipment Utilized During Treatment: Gait belt Activity Tolerance: Patient tolerated treatment well Patient left: in bed;with call bell/phone within reach;with family/visitor present (sitting EOB with daughter) Nurse Communication: Mobility status (Encouraged patient to ambulate in hallway with nursing)   GP     Van Clines [facility], [place] [phone]  06/24/2013, 5:58 PM

## 2013-06-25 DIAGNOSIS — E785 Hyperlipidemia, unspecified: Secondary | ICD-10-CM

## 2013-06-25 LAB — COMPREHENSIVE METABOLIC PANEL
ALT: 795 U/L — ABNORMAL HIGH (ref 0–35)
AST: 74 U/L — ABNORMAL HIGH (ref 0–37)
Albumin: 2.5 g/dL — ABNORMAL LOW (ref 3.5–5.2)
Alkaline Phosphatase: 115 U/L (ref 39–117)
BUN: 13 mg/dL (ref 6–23)
CO2: 34 mEq/L — ABNORMAL HIGH (ref 19–32)
Calcium: 9.1 mg/dL (ref 8.4–10.5)
Chloride: 101 mEq/L (ref 96–112)
Creatinine, Ser: 0.64 mg/dL (ref 0.50–1.10)
GFR calc Af Amer: >90 mL/min (ref >90)
GFR calc non Af Amer: >90 mL/min (ref >90)
Glucose, Bld: 82 mg/dL (ref 70–99)
Potassium: 3.1 mEq/L — ABNORMAL LOW (ref 3.5–5.1)
Sodium: 144 mEq/L (ref 135–145)
Total Bilirubin: 0.3 mg/dL (ref 0.3–1.2)
Total Protein: 5.9 g/dL — ABNORMAL LOW (ref 6.0–8.3)

## 2013-06-25 LAB — CBC WITH DIFFERENTIAL/PLATELET
Basophils Absolute: 0 10*3/uL (ref 0.0–0.1)
Basophils Relative: 0 % (ref 0–1)
Eosinophils Absolute: 0.2 10*3/uL (ref 0.0–0.7)
Eosinophils Relative: 2 % (ref 0–5)
HCT: 37.8 % (ref 36.0–46.0)
Hemoglobin: 12.2 g/dL (ref 12.0–15.0)
Lymphocytes Relative: 17 % (ref 12–46)
Lymphs Abs: 1.3 10*3/uL (ref 0.7–4.0)
MCH: 31.7 pg (ref 26.0–34.0)
MCHC: 32.3 g/dL (ref 30.0–36.0)
MCV: 98.2 fL (ref 78.0–100.0)
Monocytes Absolute: 1 10*3/uL (ref 0.1–1.0)
Monocytes Relative: 13 % — ABNORMAL HIGH (ref 3–12)
Neutro Abs: 5.2 10*3/uL (ref 1.7–7.7)
Neutrophils Relative %: 68 % (ref 43–77)
Platelets: 165 10*3/uL (ref 150–400)
RBC: 3.85 MIL/uL — ABNORMAL LOW (ref 3.87–5.11)
RDW: 13.9 % (ref 11.5–15.5)
WBC: 7.6 10*3/uL (ref 4.0–10.5)

## 2013-06-25 LAB — HEPATITIS PANEL, ACUTE
HCV Ab: NEGATIVE
Hep A IgM: NEGATIVE
Hep B C IgM: NEGATIVE
Hepatitis B Surface Ag: NEGATIVE

## 2013-06-25 LAB — MAGNESIUM: Magnesium: 1.7 mg/dL (ref 1.5–2.5)

## 2013-06-25 LAB — CK: Total CK: 44 U/L (ref 7–177)

## 2013-06-25 LAB — GLUCOSE, CAPILLARY: Glucose-Capillary: 104 mg/dL — ABNORMAL HIGH (ref 70–99)

## 2013-06-25 LAB — PHOSPHORUS: Phosphorus: 3 mg/dL (ref 2.3–4.6)

## 2013-06-25 MED ORDER — DM-GUAIFENESIN ER 30-600 MG PO TB12: 1.0000 | ORAL_TABLET | Freq: Two times a day (BID) | ORAL | Status: DC

## 2013-06-25 MED ORDER — TIOTROPIUM BROMIDE MONOHYDRATE 18 MCG IN CAPS: 18.0000 ug | ORAL_CAPSULE | Freq: Every day | RESPIRATORY_TRACT | Status: DC

## 2013-06-25 MED ORDER — LEVOFLOXACIN 750 MG PO TABS: 750.0000 mg | ORAL_TABLET | Freq: Every day | ORAL | Status: DC

## 2013-06-25 MED ORDER — IBUPROFEN 800 MG PO TABS: 800.0000 mg | ORAL_TABLET | Freq: Three times a day (TID) | ORAL | Status: DC | PRN

## 2013-06-25 MED ORDER — IPRATROPIUM BROMIDE HFA 17 MCG/ACT IN AERS: INHALATION_SPRAY | RESPIRATORY_TRACT | Status: DC

## 2013-06-25 MED ORDER — FLUTICASONE-SALMETEROL 250-50 MCG/DOSE IN AEPB: 1.0000 | INHALATION_SPRAY | Freq: Two times a day (BID) | RESPIRATORY_TRACT | Status: DC

## 2013-06-25 MED ORDER — BISACODYL 5 MG PO TBEC: 10.0000 mg | DELAYED_RELEASE_TABLET | Freq: Two times a day (BID) | ORAL | Status: DC

## 2013-06-25 MED ORDER — POLYETHYLENE GLYCOL 3350 17 G PO PACK: 17.0000 g | PACK | Freq: Two times a day (BID) | ORAL | Status: DC

## 2013-06-25 MED ORDER — ALBUTEROL SULFATE (5 MG/ML) 0.5% IN NEBU: 2.5000 mg | INHALATION_SOLUTION | RESPIRATORY_TRACT | Status: DC | PRN

## 2013-06-25 NOTE — Progress Notes (Signed)
Physical Therapy Treatment and Discharge Patient Details Name: [name] MRN: [id number] DOB: 02-Apr-1946 Today's Date: 06/25/2013 Time: 4098-1191 PT Time Calculation (min): 26 min  PT Assessment / Plan / Recommendation  History of Present Illness 67 yo with oxygen-dependent COPD, pulmonary hypertension, diastolic CHF, on Xanax, Fioricet, Codeine and Nortriptyline found at home unresponsive.  Upon arrival to ED ABG revealed respiratory acidosis. Tox screen positive for barbiturates, opioids and benzodiazepines. Placed on BiPAP and given narcan with some improvement of mental status, however hypercarbia worsened and emesis developed.  Patient intubated.   Patient extubated 06/21/13.   PT Comments   Pt discharged from acute care PT as pt completed education and met all PT goals. Pt amb. 500' with IV pole at MOD I and was able to ascend/descend 9 steps with min guard due to lines. Pt interested in simple LE strengthening HEP.   Follow Up Recommendations  No PT follow up;Supervision - Intermittent     Does the patient have the potential to tolerate intense rehabilitation     Barriers to Discharge        Equipment Recommendations  None recommended by PT    Recommendations for Other Services    Frequency     Progress towards PT Goals Progress towards PT goals: Goals met/education completed, patient discharged from PT  Plan Other (comment) (Discharged.)    Precautions / Restrictions Precautions Precautions: Fall (with lines) Restrictions Weight Bearing Restrictions: No   Pertinent Vitals/Pain No c/o of pain during session. Pt on 3L of O2 [place] during session with no c/o of SOB/dizziness.    Mobility  Bed Mobility Bed Mobility: Supine to Sit;Sitting - Scoot to Edge of Bed Supine to Sit: 7: Independent Sitting - Scoot to Edge of Bed: 7: Independent Details for Bed Mobility Assistance: Pt demonstrated good technique and safety during bed mobility. Transfers Transfers: Sit to  Stand;Stand to Sit Sit to Stand: 6: Modified independent (Device/Increase time);From bed;With upper extremity assist Stand to Sit: 6: Modified independent (Device/Increase time);To bed;With upper extremity assist Details for Transfer Assistance: MOD I as pt required UE assist during sit<>stand. Ambulation/Gait Ambulation/Gait Assistance: 6: Modified independent (Device/Increase time) Ambulation Distance (Feet): 500 Feet Assistive device: Other (Comment) (IV pole) Ambulation/Gait Assistance Details: MOD I during amb. Pt able to perform horizontal head turns during amb with LOB episodes. PT discussed utilizing pt's SPC at home and in the community when pt becomes fatigues, pt agreeable. Gait Pattern: Within Functional Limits Gait velocity: WFL Stairs Assistance: 4: Min guard Stairs Assistance Details (indicate cue type and reason): Pt would be supervision or possilby MOD I without lines (02 Radium and IV), as pt required cues to avoid pulling on lines. Rail on L going up and on R going down. Number of Stairs: 9    Exercises General Exercises - Lower Extremity Ankle Circles/Pumps: AROM;20 reps;Seated;Both Long Arc Quad: AROM;20 reps;Seated;Both Hip Flexion/Marching: AROM;20 reps;Seated;Both Mini-Sqauts: AROM;20 reps;Standing;Both   PT Diagnosis:    PT Problem List:   PT Treatment Interventions:     PT Goals (current goals can now be found in the care plan section)    Visit Information  Last PT Received On: 06/25/13 Assistance Needed: +1 History of Present Illness: 66 yo with oxygen-dependent COPD, pulmonary hypertension, diastolic CHF, on Xanax, Fioricet, Codeine and Nortriptyline found at home unresponsive.  Upon arrival to ED ABG revealed respiratory acidosis. Tox screen positive for barbiturates, opioids and benzodiazepines. Placed on BiPAP and given narcan with some improvement of mental status, however hypercarbia worsened  and emesis developed.  Patient intubated.   Patient extubated  06/21/13.    Subjective Data      Cognition  Cognition Arousal/Alertness: Awake/alert Behavior During Therapy: WFL for tasks assessed/performed Overall Cognitive Status: Within Functional Limits for tasks assessed    Balance     End of Session PT - End of Session Equipment Utilized During Treatment: Gait belt;Oxygen Activity Tolerance: Patient tolerated treatment well Patient left: in bed;with call bell/phone within reach;with family/visitor present (sitting on EOB.)   GP     Sol Blazing 06/25/2013, 10:31 AM

## 2013-06-25 NOTE — Progress Notes (Signed)
DC home with daughter, verbally understood dc instructions, no questions asked.

## 2013-06-25 NOTE — Progress Notes (Signed)
I have reviewed this note and agree with all findings. Kati Tehran Rabenold, PT, DPT Pager: [phone]   

## 2013-06-25 NOTE — Discharge Summary (Signed)
Physician Discharge Summary  Kaicee Scarpino ZOX:[id number] DOB: 1946/04/14 DOA: 06/19/2013  PCP: Elby Showers, MD  Admit date: 06/19/2013 Discharge date: 06/25/2013  Time spent: 40 minutes  Recommendations for Outpatient Follow-up:  1: Acute respiratory failure in setting of suspected polysubstance overdose, possible acute COPD exacerbation and possible aspiration. Pulmonary hypertension (PAP 76 torr Feb 2013).  -Albuterol / Atrovent when necessary -Start Advair 250/50 twice a day  -DC Flovent  -Continue Mucinex  -We'll discharge on Advair and ipratropium  2. Diastolic CHF,  -TTE ; see results below  -Continue ASA  -Hold Lipitor as elevated transaminases   3. AKI/ Hyperkalemia.  Resolved, continue to monitor daily   4. Acute liver failure. Highly suspect Tylenol toxicity (according to Pharmacy filled multiple prescriptions for Fioricet from multiple providers for the total of 250 tabs over last 10 days). Possible liver congestion in setting of severe pulmonary hypertension.  -Avoid hepatotoxic agents  -D/C Acetylcysteine IV, and start normal saline 165ml/hr   5. acute exacerbation COPD;  - No overt infection. However continue Empirical flouroquinolone for AECOPD for total of 7 days; day 4/7    6. Hypoglycemia in setting of liver dysfunction. Resolved  -Continue CBG q. a.c./each bedtime   7. Acute encephalopathy. Resolved   8. Rebound migraine; patient has been taken multiple drugs daily for migraine (3-4 tabs Fioricet , nortriptyline as well as narcotics)  - Counseled patient will use Motrin and have referred patient to pain clinic for better control of migraine. -Counseled patient on the triggers for migraine to include large amounts of caffeine, smoking, pollen at different kinds of year etc.    9. Constipation; continue MiraLax and bisacodyl upon discharge   10. Tobacco abuse; counseled patient strongly to stop (smokes 3/4 PPD x48 years)   Discharge Diagnoses:   Principal Problem:   Acute respiratory failure Active Problems:   COPD (chronic obstructive pulmonary disease)   Tobacco abuse   Acute renal failure   Acute liver failure   Tylenol toxicity   History of breast cancer in female   History of bowel infarction   Drug abuse and dependence   Discharge Condition: Stable  Diet recommendation: Heart Healthy  Filed Weights   06/23/13 0646 06/24/13 0554 06/25/13 0604  Weight: 56.019 kg (123 lb 8 oz) 59.5 kg (131 lb 2.8 oz) 61.3 kg (135 lb 2.3 oz)    History of present illness:  [name] PMHx Hx bowel infarction and she ultimately required resection and had a diverting ileostomy which ultimately was reversed after 8 months.,Breast cancer diagnosed November 2008 HER-positive/ER positive/PR negative S/P mastectomy with XRT+ chemotherapy (last treatment 2010), oxygen-dependent COPD, pulmonary hypertension, diastolic CHF, on Xanax, Fioricet, Codeine and Nortriptyline found at home unresponsive. Upon arrival to ED ABG revealed respiratory acidosis. Tox screen positive for barbiturates, opioids and benzodiazepines. Placed on BiPAP and given narcan with some improvement of mental status, however hypercarbia worsened and emesis developed. Patient intubated. 06/23/2013 has had daily migraines for which she was taking 3-4 Fioricet tablets a day over many years. States having migraines which are temporarily treated with that no but then return. States also has been taking lactulose for years secondary to chronic constipation (feels constipated now) 06/24/2013 and states feeling much better request no one she can be discharged. One complaint is that she was having an asthma attack respiratory refused to give her breathing treatment early. Patient had used own home rescue inhalers. TODAY she has improved overnight, liver enzymes although still normal continue to decrease. Patient requests  to be discharged    Procedures:  CXR 06/20/2013 FINDINGS:  Endotracheal  tube ends in the mid thoracic trachea. An enteric tube  crosses the diaphragm, with side port near the GE junction.  No change in mild cardiomegaly.  Patchy reticular nodular opacities are stable from priors, most  dense at the right apex. No evidence of effusion or pneumothorax.  Right axillary dissection.  Consultations:    Discharge Exam: Filed Vitals:   06/24/13 1645 06/24/13 1953 06/24/13 2108 06/25/13 0604  BP:   130/63 125/58  Pulse:   84 79  Temp:   98.7 F (37.1 C) 97.8 F (36.6 C)  TempSrc:   Oral Oral  Resp:   18 18  Height:      Weight:    61.3 kg (135 lb 2.3 oz)  SpO2: 92% 98% 99% 100%   General: A./O. X4, NAD  Cardiovascular: Regular rhythm and rate, negative murmurs rubs gallops, DP/PT pulse 2+ bilateral  Respiratory: Inspiratory and expiratory wheezing diffusely, air movement in all lobes, continue mild rhonchi however greatly improved   Abdomen: Soft nontender nondistended plus bowel sounds  Musculoskeletal: Negative pedal edema   Discharge Instructions   Future Appointments Provider Department Dept Phone   07/03/2013 11:00 AM Lowella Dell, MD [facility] MEDICAL ONCOLOGY [phone]   07/03/2013 12:00 PM [facility] MED[facility] [phone]   07/19/2013 10:30 AM Chrystie Nose, MD [facility] [phone]       Medication List    ASK your doctor about these medications       albuterol 108 (90 BASE) MCG/ACT inhaler  Commonly known as:  PROVENTIL HFA;VENTOLIN HFA  Inhale 2 puffs into the lungs every 4 (four) hours as needed. Shortness of breath and wheezing  Patient is using Pro-Air brand     ALPRAZolam 1 MG tablet  Commonly known as:  XANAX  Take 1 mg by mouth 3 (three) times daily as needed. For anxitey     ALPRAZolam 1 MG tablet  Commonly known as:  XANAX  Take 1 mg by mouth 3 (three) times daily as needed for sleep or anxiety.     anastrozole 1 MG tablet  Commonly known as:   ARIMIDEX  Take 1 mg by mouth daily.     aspirin EC 81 MG tablet  Take 81 mg by mouth daily.     atorvastatin 80 MG tablet  Commonly known as:  LIPITOR  Take 80 mg by mouth at bedtime.     butalbital-acetaminophen-caffeine 50-325-40 MG per tablet  Commonly known as:  FIORICET, ESGIC  Take 1 tablet by mouth every 4 (four) hours as needed. For headaches     butalbital-acetaminophen-caffeine 50-325-40-30 MG per capsule  Commonly known as:  FIORICET WITH CODEINE  Take 1 capsule by mouth at bedtime as needed.     calcium-vitamin D 250-125 MG-UNIT per tablet  Commonly known as:  OSCAL WITH D  Take 1 tablet by mouth daily.     diclofenac sodium 1 % Gel  Commonly known as:  VOLTAREN  Apply 1 application topically daily as needed. Hand pain     DUONEB 0.5-2.5 (3) MG/3ML Soln  Generic drug:  ipratropium-albuterol  Take 3 mLs by nebulization every 4 (four) hours as needed.     fluticasone 220 MCG/ACT inhaler  Commonly known as:  FLOVENT HFA  Inhale 2 puffs into the lungs 2 (two) times daily. For shortness of breath     furosemide 40  MG tablet  Commonly known as:  LASIX  Take 1 tablet (40 mg total) by mouth 2 (two) times daily.     furosemide 40 MG tablet  Commonly known as:  LASIX  Take 40 mg by mouth daily.     ipratropium 17 MCG/ACT inhaler  Commonly known as:  ATROVENT HFA  Inhale 2 puffs into the lungs every 4 (four) hours as needed. For shortness of breath     lactulose 10 GM/15ML solution  Commonly known as:  CHRONULAC  Take 30 g by mouth as needed.     montelukast 10 MG tablet  Commonly known as:  SINGULAIR  Take 10 mg by mouth at bedtime.     multivitamin with minerals tablet  Take 1 tablet by mouth daily.     nortriptyline 25 MG capsule  Commonly known as:  PAMELOR  Take 25 mg by mouth at bedtime.     omega-3 acid ethyl esters 1 G capsule  Commonly known as:  LOVAZA  Take 1 g by mouth daily.     ondansetron 8 MG tablet  Commonly known as:  ZOFRAN  Take  1 tablet (8 mg total) by mouth every 12 (twelve) hours as needed.     pantoprazole 40 MG tablet  Commonly known as:  PROTONIX  Take 40 mg by mouth daily.     potassium chloride SA 20 MEQ tablet  Commonly known as:  K-DUR,KLOR-CON  Take 20 mEq by mouth daily.       Allergies  Allergen Reactions  . Ceftriaxone Other (See Comments)    "Blow up like a balloon"      The results of significant diagnostics from this hospitalization (including imaging, microbiology, ancillary and laboratory) are listed below for reference.    Significant Diagnostic Studies: Ct Head Wo Contrast  06/19/2013   CLINICAL DATA:  Unresponsive patient. Difficulty breathing. Cyanosis.  EXAM: CT HEAD WITHOUT CONTRAST  TECHNIQUE: Contiguous axial images were obtained from the base of the skull through the vertex without intravenous contrast.  COMPARISON:  07/31/2011  FINDINGS: The brainstem, cerebellum, cerebral peduncles, thalamus, basal ganglia, basilar cisterns, and ventricular system appear within normal limits. No intracranial hemorrhage, mass lesion, or acute CVA.  IMPRESSION: No significant abnormality identified.   Electronically Signed   By: Herbie Baltimore   On: 06/19/2013 19:50   Dg Chest Port 1 View  06/21/2013   CLINICAL DATA:  Check endotracheal tube. Shortness of breath.  EXAM: PORTABLE CHEST - 1 VIEW  COMPARISON:  06/20/2013.  FINDINGS: Endotracheal tube ends in the mid thoracic trachea. An enteric tube crosses the diaphragm, with side port near the GE junction.  No change in mild cardiomegaly.  Patchy reticular nodular opacities are stable from priors, most dense at the right apex. No evidence of effusion or pneumothorax. Right axillary dissection.  IMPRESSION: 1. Stable positioning of enteric and endotracheal tubes. 2. Unchanged lung aeration.   Electronically Signed   By: Tiburcio Pea   On: 06/21/2013 05:23   Dg Chest Port 1 View  06/20/2013   CLINICAL DATA:  67 year old female intubated.  Respiratory failure.  EXAM: PORTABLE CHEST - 1 VIEW  COMPARISON:  06/19/2013 and earlier.  FINDINGS: Portable AP semi upright view at at 1005 hrs. Stable endotracheal tube tip. Enteric tube now in place, courses to the abdomen. Multiple EKG leads and wires overlie the chest. Stable postoperative changes to the right chest wall and axilla. Stable lung volumes. Stable cardiac size and mediastinal contours. Mildly decreased interstitial  opacity. No pneumothorax or consolidation. Possible small pleural effusions.  IMPRESSION: 1. Stable endotracheal tube. Enteric tube in place, courses to the left upper quadrant.  2. Mildly decreased vascular congestion with possible small pleural effusions. No other acute cardiopulmonary abnormality.   Electronically Signed   By: Augusto Gamble M.D.   On: 06/20/2013 10:36   Dg Chest Portable 1 View  06/19/2013   CLINICAL DATA:  Endotracheal tube placement.  EXAM: PORTABLE CHEST - 1 VIEW  COMPARISON:  Earlier the same date. CT 01/31/2012.  FINDINGS: 2246 hr. Interval intubation. The tip of the endotracheal tube is approximately 3.1 cm above the carina. Cardiomegaly, vascular congestion and generalized interstitial prominence are unchanged. There is no airspace disease or significant pleural effusion. Surgical clips are present within the right axilla.  IMPRESSION: Satisfactorily positioned endotracheal tube. No new findings identified.   Electronically Signed   By: Roxy Horseman   On: 06/19/2013 22:59   Dg Chest Port 1 View  06/19/2013   CLINICAL DATA:  Respiratory distress. Current history of breast cancer for which the patient has undergone lumpectomy and radiation therapy.  EXAM: PORTABLE CHEST - 1 VIEW  COMPARISON:  CT chest 01/31/2012. Portable chest x-ray 11/10/2011 and 06/03/2011.  FINDINGS: Cardiac silhouette mildly enlarged but stable. Thoracic aorta mildly atherosclerotic, unchanged. Prominent central pulmonary arteries, similar to the prior examinations. Mild diffuse  interstitial lung disease, present on the prior examinations and unchanged. Scarring in the right lung apex and scar/bronchiectasis in the right middle lobe as noted on the prior CT. No new pulmonary parenchymal abnormalities.  IMPRESSION: Stable mild chronic interstitial lung disease and the scarring in the right lung apex and right middle lobe. No acute cardiopulmonary disease.   Electronically Signed   By: Hulan Saas   On: 06/19/2013 19:16    Microbiology: Recent Results (from the past 240 hour(s))  CULTURE, BLOOD (ROUTINE X 2)     Status: None   Collection Time    06/19/13  6:45 PM      Result Value Range Status   Specimen Description BLOOD ARM LEFT   Final   Special Requests BOTTLES DRAWN AEROBIC ONLY 2CC   Final   Culture  Setup Time     Final   Value: 06/20/2013 01:48     Performed at [facility]   Culture     Final   Value:        BLOOD CULTURE RECEIVED NO GROWTH TO DATE CULTURE WILL BE HELD FOR 5 DAYS BEFORE ISSUING A FINAL NEGATIVE REPORT     Performed at [facility]   Report Status PENDING   Incomplete  CULTURE, BLOOD (ROUTINE X 2)     Status: None   Collection Time    06/19/13  7:00 PM      Result Value Range Status   Specimen Description BLOOD HAND RIGHT   Final   Special Requests BOTTLES DRAWN AEROBIC ONLY 3CC   Final   Culture  Setup Time     Final   Value: 06/20/2013 01:48     Performed at [facility]   Culture     Final   Value:        BLOOD CULTURE RECEIVED NO GROWTH TO DATE CULTURE WILL BE HELD FOR 5 DAYS BEFORE ISSUING A FINAL NEGATIVE REPORT     Performed at [facility]   Report Status PENDING   Incomplete  CULTURE, RESPIRATORY (NON-EXPECTORATED)     Status: None   Collection  Time    06/19/13 11:07 PM      Result Value Range Status   Specimen Description TRACHEAL ASPIRATE   Final   Special Requests NONE   Final   Gram Stain     Final   Value: FEW WBC PRESENT,BOTH PMN AND MONONUCLEAR     NO SQUAMOUS EPITHELIAL CELLS  SEEN     NO ORGANISMS SEEN   Culture RARE PSEUDOMONAS AERUGINOSA   Final   Report Status 06/23/2013 FINAL   Final   Organism ID, Bacteria PSEUDOMONAS AERUGINOSA   Final  MRSA PCR SCREENING     Status: None   Collection Time    06/20/13  7:18 AM      Result Value Range Status   MRSA by PCR NEGATIVE  NEGATIVE Final   Comment:            The GeneXpert MRSA Assay (FDA     approved for NASAL specimens     only), is one component of a     comprehensive MRSA colonization     surveillance program. It is not     intended to diagnose MRSA     infection nor to guide or     monitor treatment for     MRSA infections.  RESPIRATORY VIRUS PANEL     Status: Abnormal   Collection Time    06/20/13 11:08 AM      Result Value Range Status   Source - RVPAN Not Provided   Final   Respiratory Syncytial Virus A NOT DETECTED   Final   Respiratory Syncytial Virus B NOT DETECTED   Final   Influenza A NOT DETECTED   Final   Influenza B NOT DETECTED   Final   Parainfluenza 1 NOT DETECTED   Final   Parainfluenza 2 NOT DETECTED   Final   Parainfluenza 3 NOT DETECTED   Final   Metapneumovirus NOT DETECTED   Final   Rhinovirus DETECTED (*)  Final   Adenovirus NOT DETECTED   Final   Influenza A H1 NOT DETECTED   Final   Influenza A H3 NOT DETECTED   Final   Comment: (NOTE)           Normal Reference Range for each Analyte: NOT DETECTED     Testing performed using the Luminex xTAG Respiratory Viral Panel test     kit.     This test was developed and its performance characteristics determined     by [facility]. It has not been cleared or approved by the [place]     Food and Drug Administration. This test is used for clinical purposes.     It should not be regarded as investigational or for research. This     laboratory is certified under the Clinical Laboratory Improvement     Amendments of 1988 (CLIA) as qualified to perform high complexity     clinical laboratory testing.     Performed at [facility]  CULTURE, RESPIRATORY (NON-EXPECTORATED)     Status: None   Collection Time    06/20/13 12:21 PM      Result Value Range Status   Specimen Description TRACHEAL ASPIRATE   Final   Special Requests NONE   Final   Gram Stain     Final   Value: ABUNDANT WBC PRESENT, PREDOMINANTLY PMN     NO SQUAMOUS EPITHELIAL CELLS SEEN     NO ORGANISMS SEEN     Performed at [facility]  Culture     Final   Value: NO GROWTH 2 DAYS     Performed at [facility]   Report Status 06/22/2013 FINAL   Final     Labs: Basic Metabolic Panel:  Recent Labs Lab 06/21/13 0502 06/22/13 0400 06/23/13 0505 06/23/13 1530 06/24/13 0441 06/25/13 0515  NA 141 143 142 139 142 144  K 3.9 3.7 2.8*  2.8* 3.4* 3.4* 3.1*  CL 99 100 98 96 99 101  CO2 24 31 37* 33* 37* 34*  GLUCOSE 164* 106* 85 174* 103* 82  BUN 43* 41* 24* 20 18 13   CREATININE 1.84* 1.08 0.75 0.67 0.65 0.64  CALCIUM 9.2 9.6 9.5 9.2 9.0 9.1  MG 2.1 1.9 1.5  --  1.9 1.7  PHOS 3.2 2.8 1.9*  --  3.0 3.0   Liver Function Tests:  Recent Labs Lab 06/22/13 0400 06/23/13 0505 06/23/13 1530 06/24/13 0441 06/25/13 0515  AST 946* 307* 206* 124* 74*  ALT 2872* 1790* 1495* 1062* PENDING  ALKPHOS 183* 158* 151* 123* 115  BILITOT 0.2* 0.2* 0.3 0.2* 0.3  PROT 6.8 6.4 6.3 5.7* 5.9*  ALBUMIN 2.6* 2.5* 2.4* 2.2* 2.5*   No results found for this basename: LIPASE, AMYLASE,  in the last 168 hours  Recent Labs Lab 06/19/13 2318 06/20/13 1553  AMMONIA 91* 47   CBC:  Recent Labs Lab 06/21/13 0502 06/22/13 0400 06/23/13 0505 06/24/13 0441 06/24/13 1545 06/25/13 0515  WBC 10.4 12.8* 8.1 6.5 6.8 7.6  NEUTROABS 9.6* 11.1* 5.9 66.8*  --  5.2  HGB 12.1 13.0 13.3 12.2 12.6 12.2  HCT 35.9* 39.9 39.5 36.8 39.0 37.8  MCV 95.7 97.1 96.3 97.4 98.2 98.2  PLT 163 181 185 PLATELET CLUMPS NOTED ON SMEAR, COUNT APPEARS DECREASED 180 165   Cardiac Enzymes:  Recent Labs Lab 06/19/13 2318 06/20/13 0501 06/20/13 1025  06/20/13 1357 06/20/13 2030 06/22/13 0400 06/23/13 1530 06/24/13 0441 06/25/13 0515  CKTOTAL  --   --   --   --   --  54 64 47 44  TROPONINI 0.47* 0.40* 0.46* 0.38* <0.30  --   --   --   --    BNP: BNP (last 3 results)  Recent Labs  06/19/13 1900 06/21/13 0502 06/22/13 0400  PROBNP 5267.0* 3048.0* 1715.0*   CBG:  Recent Labs Lab 06/23/13 1232 06/23/13 1645 06/24/13 1145 06/24/13 1730 06/24/13 2112  GLUCAP 189* 163* 101* 83 99       Signed:  Carolyne Littles, MD [facility] [phone] pager

## 2013-06-26 LAB — CULTURE, BLOOD (ROUTINE X 2)
Culture: NO GROWTH
Culture: NO GROWTH

## 2013-07-03 ENCOUNTER — Ambulatory Visit: Payer: Medicare Other

## 2013-07-03 ENCOUNTER — Encounter: Payer: Self-pay | Admitting: Oncology

## 2013-07-03 ENCOUNTER — Ambulatory Visit: Payer: Medicare Other | Admitting: Oncology

## 2013-07-13 ENCOUNTER — Other Ambulatory Visit: Payer: Self-pay | Admitting: *Deleted

## 2013-07-13 ENCOUNTER — Telehealth: Payer: Self-pay | Admitting: Oncology

## 2013-07-13 NOTE — Telephone Encounter (Signed)
Returned pt's call re r/s appt w/GM. gv pt new appt for lb/GM/inf 08/21/13 @ 2pm. Pt has new d/t.

## 2013-07-16 ENCOUNTER — Encounter: Payer: Self-pay | Admitting: *Deleted

## 2013-07-18 ENCOUNTER — Encounter: Payer: Self-pay | Admitting: Internal Medicine

## 2013-07-19 ENCOUNTER — Ambulatory Visit: Payer: Medicare Other | Admitting: Internal Medicine

## 2013-08-09 ENCOUNTER — Encounter: Payer: Self-pay | Admitting: Internal Medicine

## 2013-08-09 ENCOUNTER — Ambulatory Visit (INDEPENDENT_AMBULATORY_CARE_PROVIDER_SITE_OTHER): Payer: Medicare Other | Admitting: Internal Medicine

## 2013-08-09 VITALS — BP 130/62 | HR 89 | Ht 62.0 in | Wt 110.8 lb

## 2013-08-09 DIAGNOSIS — I359 Nonrheumatic aortic valve disorder, unspecified: Secondary | ICD-10-CM

## 2013-08-09 DIAGNOSIS — J4489 Other specified chronic obstructive pulmonary disease: Secondary | ICD-10-CM

## 2013-08-09 DIAGNOSIS — J449 Chronic obstructive pulmonary disease, unspecified: Secondary | ICD-10-CM

## 2013-08-09 DIAGNOSIS — I2510 Atherosclerotic heart disease of native coronary artery without angina pectoris: Secondary | ICD-10-CM

## 2013-08-09 DIAGNOSIS — I272 Pulmonary hypertension, unspecified: Secondary | ICD-10-CM

## 2013-08-09 DIAGNOSIS — E785 Hyperlipidemia, unspecified: Secondary | ICD-10-CM

## 2013-08-09 DIAGNOSIS — I472 Ventricular tachycardia, unspecified: Secondary | ICD-10-CM

## 2013-08-09 DIAGNOSIS — I2789 Other specified pulmonary heart diseases: Secondary | ICD-10-CM

## 2013-08-09 DIAGNOSIS — I350 Nonrheumatic aortic (valve) stenosis: Secondary | ICD-10-CM

## 2013-08-09 NOTE — Patient Instructions (Signed)
Your physician wants you to follow-up in:  6 months. You will receive a reminder letter in the mail two months in advance. If you don't receive a letter, please call our office to schedule the follow-up appointment.   

## 2013-08-10 ENCOUNTER — Encounter: Payer: Self-pay | Admitting: Internal Medicine

## 2013-08-10 NOTE — Progress Notes (Signed)
  OFFICE NOTE  Chief Complaint:  Hospital follow-up  Primary Care Physician: JARALLA SHAMLEFFER, IBTEHAL, MD  HPI:  [name] a pleasant 67-year-old female with a history of COPD and severe pulmonary hypertension. Recently she was put on home oxygen and has been on an oxygen concentrator, which has made a marked improvement in her ability to get around. She has previously seen Dr. Byrum with pulmonology; however, that did not go too well and she has basically given up on any further treatments for COPD other than her oxygen. She still uses nebulizers as needed when she is tight, and I recommended Claritin for seasonal allergies today. As you know, she also has mild aortic stenosis and we are continuing to follow that. She is a low-risk stress test in November 2012. Unfortunately she was recently admitted for altered mental status, possible Tylenol overdose, respiratory failure and liver failure. All of which seems to have resolved. She seems to be doing pre-well after discharge. I understand that she will have be having another followup with pulmonary next week.  PMHx:  Past Medical History  Diagnosis Date  . Angina   . Heart murmur   . Depressed   . Coronary artery disease 2006    2 stents w/previous MI  . Hypertension   . Migraine   . Anxiety   . Myocardial infarction 2006    NSTEMI  . COPD (chronic obstructive pulmonary disease)     oxygen-dependent 4LPM [place]  . Moderate to severe pulmonary hypertension   . Diastolic CHF   . Cancer of breast dx'd 2008    RT Breast  . Aortic stenosis     mild  . Dyslipidemia   . NSVT (nonsustained ventricular tachycardia)     h/o  . History of nuclear stress test 08/03/2011    attenuation at apex - no perfusion defects     Past Surgical History  Procedure Laterality Date  . Breast lumpectomy Right 2008  . Cardiac catheterization  2006  . Cholecystectomy  2011  . Abdominal surgery  2009  . Transthoracic echocardiogram  11/12/2011   EF 55-60%, normal systolic function, grade 1 diastolic dysfunction; ventricular septal flattening (D-sign); mild AS; trace-mild MR; LA mildly dilated; RV mod dilated; RA mod dilated; severe pulm HTN; elevated CVP    FAMHx:  Family History  Problem Relation Age of Onset  . Schizophrenia Sister   . Alzheimer's disease Mother   . Hyperlipidemia Brother   . Heart disease Sister   . Diabetes Sister     SOCHx:   reports that she has been smoking Cigarettes.  She has a 11.75 pack-year smoking history. She has never used smokeless tobacco. She reports that she drinks alcohol. She reports that she does not use illicit drugs.  ALLERGIES:  Allergies  Allergen Reactions  . Ceftriaxone Other (See Comments)    "Blow up like a balloon"    ROS: A comprehensive review of systems was negative except for: Respiratory: positive for dyspnea on exertion Gastrointestinal: positive for constipation  HOME MEDS: Current Outpatient Prescriptions  Medication Sig Dispense Refill  . albuterol (PROVENTIL HFA;VENTOLIN HFA) 108 (90 BASE) MCG/ACT inhaler Inhale 2 puffs into the lungs every 4 (four) hours as needed. Shortness of breath and wheezing  Patient is using Pro-Air brand      . albuterol (PROVENTIL) (5 MG/ML) 0.5% nebulizer solution Take 0.5 mLs (2.5 mg total) by nebulization every 2 (two) hours as needed for wheezing or shortness of breath.  20 mL    12  . ALPRAZolam (XANAX) 1 MG tablet Take 1 mg by mouth 3 (three) times daily as needed for sleep or anxiety.      . anastrozole (ARIMIDEX) 1 MG tablet Take 1 mg by mouth daily.       . aspirin 325 MG EC tablet Take 325 mg by mouth daily.      . bisacodyl (DULCOLAX) 5 MG EC tablet Take 2 tablets (10 mg total) by mouth 2 (two) times daily.  30 tablet  0  . Butalbital-APAP-Caffeine (FIORICET PO) Take 650 mg by mouth 4 (four) times daily as needed.      . calcium-vitamin D (OSCAL WITH D) 250-125 MG-UNIT per tablet Take 1 tablet by mouth daily.      .  dextromethorphan-guaiFENesin (MUCINEX DM) 30-600 MG per 12 hr tablet Take 1 tablet by mouth 2 (two) times daily.  60 tablet  0  . diclofenac sodium (VOLTAREN) 1 % GEL Apply 1 application topically daily as needed. Hand pain      . Fluticasone Propionate, Inhal, (FLOVENT DISKUS IN) Inhale 2 puffs into the lungs daily.      . furosemide (LASIX) 40 MG tablet Take 40 mg by mouth 2 (two) times daily.       . ibuprofen (ADVIL,MOTRIN) 800 MG tablet Take 1 tablet (800 mg total) by mouth 3 (three) times daily as needed.  30 tablet  0  . ipratropium (ATROVENT HFA) 17 MCG/ACT inhaler 2 puffs into long every 6 hour when necessary SOB  2 Inhaler  0  . LACTULOSE PO Take by mouth 2 (two) times daily.       . montelukast (SINGULAIR) 10 MG tablet Take 10 mg by mouth at bedtime.       . Multiple Vitamins-Minerals (MULTIVITAMIN WITH MINERALS) tablet Take 1 tablet by mouth daily.      . nortriptyline (PAMELOR) 25 MG capsule Take 25 mg by mouth at bedtime.      . Omega 3 1200 MG CAPS Take 1 capsule by mouth daily.      . ondansetron (ZOFRAN) 8 MG tablet Take 1 tablet (8 mg total) by mouth every 12 (twelve) hours as needed.  30 tablet  3  . OXYGEN-HELIUM IN Inhale 3 L into the lungs as needed.      . pantoprazole (PROTONIX) 40 MG tablet Take 40 mg by mouth daily.      . potassium chloride SA (K-DUR,KLOR-CON) 20 MEQ tablet Take 20 mEq by mouth daily.      . Zoledronic Acid (ZOMETA IV) Inject into the vein every 6 (six) months.       No current facility-administered medications for this visit.    LABS/IMAGING: No results found for this or any previous visit (from the past 48 hour(s)). No results found.  VITALS: BP 130/62  Pulse 89  Ht 5' 2" (1.575 m)  Wt 110 lb 12.8 oz (50.259 kg)  BMI 20.26 kg/m2  EXAM: General appearance: alert, appears older than stated age and mild distress Neck: no carotid bruit and no JVD Lungs: diminished breath sounds bilaterally and faint rhonchi Heart: regular rate and rhythm,  S1, S2 normal and systolic murmur: early systolic 2/6, crescendo at 2nd right intercostal space Abdomen: soft, non-tender; bowel sounds normal; no masses,  no organomegaly Extremities: extremities normal, atraumatic, no cyanosis or edema Pulses: 2+ and symmetric Skin: Skin color, texture, turgor normal. No rashes or lesions Neurologic: Grossly normal Psych: Anxious, upset about daughter being in jail  EKG: Normal sinus   rhythm at 89  ASSESSMENT: 1. Severe COPD with severe pulmonary hypertension 2. Mild aortic stenosis 3. Low risk nuclear stress testing in 2012 4. Dyslipidemia - not on statin due to recent hepatitis  PLAN: 1.   Ms. Gillock is recovering from her recent episode of altered mental status and possible liver failure which may be related to overuse of Tylenol and Tylenol containing products. She reported taking significant number of Fioricet for headaches which she has on a daily basis.  She continues to use her oxygen but prefers to use it while laying at rest and not when up and exerting herself, I explained to her that this is the opposite of how it should be used. In fact at this point she probably should use a 24/7. Official discuss this more with her upcoming visit to see Dr. McQuaid. For the time being, I would recommend that she stay off of her cholesterol medication. Hopefully she can get some potential medications for her headaches but does contain Tylenol. Her current dose of Lasix is appropriate and I would recommend she stay some aspirin for prevention of coronary disease. Her recent lipid profile showed total cholesterol 223, triglycerides 147, HDL 60 and LDL 134, which is reasonable for primary prevention. Plan to see her back in 6 months or sooner as necessary.  Oluwatamilore Starnes C. Saylor Sheckler, MD, FACC Attending Cardiologist [facility]  Zemirah Krasinski C 08/10/2013, 11:05 AM  

## 2013-08-14 ENCOUNTER — Ambulatory Visit (INDEPENDENT_AMBULATORY_CARE_PROVIDER_SITE_OTHER): Payer: Medicare Other | Admitting: Pulmonary Disease

## 2013-08-14 ENCOUNTER — Encounter: Payer: Self-pay | Admitting: Pulmonary Disease

## 2013-08-14 VITALS — BP 112/68 | HR 101

## 2013-08-14 DIAGNOSIS — I509 Heart failure, unspecified: Secondary | ICD-10-CM

## 2013-08-14 DIAGNOSIS — J449 Chronic obstructive pulmonary disease, unspecified: Secondary | ICD-10-CM

## 2013-08-14 DIAGNOSIS — I272 Pulmonary hypertension, unspecified: Secondary | ICD-10-CM

## 2013-08-14 DIAGNOSIS — I2789 Other specified pulmonary heart diseases: Secondary | ICD-10-CM

## 2013-08-14 MED ORDER — BUDESONIDE-FORMOTEROL FUMARATE 160-4.5 MCG/ACT IN AERO: 2.0000 | INHALATION_SPRAY | Freq: Two times a day (BID) | RESPIRATORY_TRACT | Status: DC

## 2013-08-14 NOTE — Patient Instructions (Signed)
Take the Symbicort twice a day no matter how you feel. Take this instead of the Flovent. We will arrange a Right heart catheterization, Pulmonary function testing, and a 6 minute walk Quit smoking! Use your oxygen regularly We will see you back in 4-6 weeks after the heart catheterization

## 2013-08-14 NOTE — Assessment & Plan Note (Addendum)
Given her lengthy (and ongoing) smoking history, her hypoxemia, and wheezing on exam I agree with this diagnosis.    Getting a history is somewhat challenging here, but it doesn't sound like she has frequent exacerbations.  She needs to quit smoking and we needs pft's to assess the severity.  Plan: -full PFT -stop Flovent -trial Symbicort 160/4.5 2 puff bid -use oxygen regularly! -stop smoking!

## 2013-08-14 NOTE — Assessment & Plan Note (Signed)
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  However, I was not impressed by significant emphysema on her 2013 CT chest (which incidentally showed a few other old scars but no significant pulmonary fibrosis).  Plan: -plan for right heart catheterization (Will refer back to Dr. Blanchie Dessert group for this, preferably Dr. Rennis Golden or Bensimhon to perform) -Considering her underlying diastolic dysfunction, I would ask them to consider giving 500cc of fluid if the wedge is normal on that study

## 2013-08-14 NOTE — Progress Notes (Signed)
Subjective:    Patient ID: [name], female    DOB: [birth year], 67 y.o.   MRN: [id number]  HPI  08/14/2013 ROV > This is a pleasant lady from [place] that is here to see me regarding pulmonary hypertension and COPD.  She has smoked nearly everyday of her life since age 33 with the exception of a few days spent on a ventilator.  She has had recurrent bronchitis since her 40's.  She notes that she gets short of breath with heavy exertion, but she has not felt limited with regular, everyday activities like going shopping, cleaning her house, etc.  Carrying heavy objects makes her dyspnic.  She has been on oxygen for a few years, but admits that she is not very good about using it regularly.  In fact she notes that she will go shopping for 5 hours without it and not feel dyspnic.  She has a daily cough productive of clear sputum.  She has not had bronchitis in 18 months, but this has been a recurrent problem for her over the years.  She does not have leg swelling, chest pain or tightness.  She has struggled with CHF over the years.  She was [facility] to [facility] for a presumed drug overdose.  She has had trouble with DPI inhalers over the years exacerbating her cough.  She uses her duoneb about twice a day and her albuterol once a day.  They help when she uses them.  She had "double pneumonia" when she was 65 months old.  She continues to smoke about three cigarettes a day.   She had breast cancer in 2008 and had a lumpectomy and XRT/chemo.  She has been disease free for 4.5 years now.    Past Medical History  Diagnosis Date  . Angina   . Heart murmur   . Depressed   . Coronary artery disease 2006    2 stents w/previous MI  . Hypertension   . Migraine   . Anxiety   . Myocardial infarction 2006    NSTEMI  . COPD (chronic obstructive pulmonary disease)     oxygen-dependent 4LPM [place]  . Moderate to severe pulmonary hypertension   . Diastolic CHF   . Cancer of breast dx'd 2008    RT  Breast  . Aortic stenosis     mild  . Dyslipidemia   . NSVT (nonsustained ventricular tachycardia)     h/o  . History of nuclear stress test 08/03/2011    attenuation at apex - no perfusion defects      Family History  Problem Relation Age of Onset  . Schizophrenia Sister   . Alzheimer's disease Mother   . Hyperlipidemia Brother   . Heart disease Sister   . Diabetes Sister      History   Social History  . Marital Status: Single    Spouse Name: N/A    Number of Children: 3  . Years of Education: 12   Occupational History  . Not on file.   Social History Main Topics  . Smoking status: Current Every Day Smoker -- 0.25 packs/day for 47 years    Types: Cigarettes  . Smokeless tobacco: Never Used     Comment: "once in a blue moon"  . Alcohol Use: Yes     Comment: wine with dinner occasionally  . Drug Use: No  . Sexual Activity: Not Currently     Comment: 3 cig a day   Other Topics Concern  .  Not on file   Social History Narrative  . No narrative on file     Allergies  Allergen Reactions  . Ceftriaxone Other (See Comments)    "Blow up like a balloon"     Outpatient Prescriptions Prior to Visit  Medication Sig Dispense Refill  . albuterol (PROVENTIL HFA;VENTOLIN HFA) 108 (90 BASE) MCG/ACT inhaler Inhale 2 puffs into the lungs every 4 (four) hours as needed. Shortness of breath and wheezing  Patient is using Pro-Air brand      . albuterol (PROVENTIL) (5 MG/ML) 0.5% nebulizer solution Take 0.5 mLs (2.5 mg total) by nebulization every 2 (two) hours as needed for wheezing or shortness of breath.  20 mL  12  . ALPRAZolam (XANAX) 1 MG tablet Take 1 mg by mouth 3 (three) times daily as needed for sleep or anxiety.      Marland Kitchen anastrozole (ARIMIDEX) 1 MG tablet Take 1 mg by mouth daily.       Marland Kitchen aspirin 325 MG EC tablet Take 325 mg by mouth daily.      . bisacodyl (DULCOLAX) 5 MG EC tablet Take 2 tablets (10 mg total) by mouth 2 (two) times daily.  30 tablet  0  .  calcium-vitamin D (OSCAL WITH D) 250-125 MG-UNIT per tablet Take 1 tablet by mouth daily.      Marland Kitchen dextromethorphan-guaiFENesin (MUCINEX DM) 30-600 MG per 12 hr tablet Take 1 tablet by mouth 2 (two) times daily.  60 tablet  0  . diclofenac sodium (VOLTAREN) 1 % GEL Apply 1 application topically daily as needed. Hand pain      . Fluticasone Propionate, Inhal, (FLOVENT DISKUS IN) Inhale 2 puffs into the lungs daily.      . furosemide (LASIX) 40 MG tablet Take 40 mg by mouth 2 (two) times daily.       Marland Kitchen ibuprofen (ADVIL,MOTRIN) 800 MG tablet Take 1 tablet (800 mg total) by mouth 3 (three) times daily as needed.  30 tablet  0  . ipratropium (ATROVENT HFA) 17 MCG/ACT inhaler 2 puffs into long every 6 hour when necessary SOB  2 Inhaler  0  . LACTULOSE PO Take by mouth 2 (two) times daily.       . montelukast (SINGULAIR) 10 MG tablet Take 10 mg by mouth at bedtime.       . Multiple Vitamins-Minerals (MULTIVITAMIN WITH MINERALS) tablet Take 1 tablet by mouth daily.      . nortriptyline (PAMELOR) 25 MG capsule Take 25 mg by mouth at bedtime.      . Omega 3 1200 MG CAPS Take 1 capsule by mouth daily.      . ondansetron (ZOFRAN) 8 MG tablet Take 1 tablet (8 mg total) by mouth every 12 (twelve) hours as needed.  30 tablet  3  . OXYGEN-HELIUM IN Inhale 3 L into the lungs as needed.      . pantoprazole (PROTONIX) 40 MG tablet Take 40 mg by mouth daily.      . potassium chloride SA (K-DUR,KLOR-CON) 20 MEQ tablet Take 20 mEq by mouth daily.      . Zoledronic Acid (ZOMETA IV) Inject into the vein every 6 (six) months.      . Butalbital-APAP-Caffeine (FIORICET PO) Take 650 mg by mouth 4 (four) times daily as needed.       No facility-administered medications prior to visit.      Review of Systems  Constitutional: Negative for fever, chills, diaphoresis, activity change, appetite change, fatigue and unexpected weight  change.  HENT: Negative for congestion, dental problem, ear discharge, ear pain, facial  swelling, hearing loss, mouth sores, nosebleeds, postnasal drip, rhinorrhea, sinus pressure, sneezing, sore throat, tinnitus, trouble swallowing and voice change.   Eyes: Negative for photophobia, discharge, itching and visual disturbance.  Respiratory: Positive for cough and shortness of breath. Negative for apnea, choking, chest tightness, wheezing and stridor.   Cardiovascular: Negative for chest pain, palpitations and leg swelling.  Gastrointestinal: Negative for nausea, vomiting, abdominal pain, constipation, blood in stool and abdominal distention.  Genitourinary: Negative for dysuria, urgency, frequency, hematuria, flank pain, decreased urine volume and difficulty urinating.  Musculoskeletal: Negative for arthralgias, back pain, gait problem, joint swelling, myalgias, neck pain and neck stiffness.  Skin: Negative for color change, pallor and rash.  Neurological: Negative for dizziness, tremors, seizures, syncope, speech difficulty, weakness, light-headedness, numbness and headaches.  Hematological: Negative for adenopathy. Does not bruise/bleed easily.  Psychiatric/Behavioral: Negative for confusion, sleep disturbance and agitation. The patient is not nervous/anxious.        Objective:   Physical Exam  Filed Vitals:   08/14/13 1513  BP: 112/68  Pulse: 101  SpO2: 90%   RA  Gen: chronically ill appearing, no acute distress HEENT: NCAT, PERRL, EOMi, OP clear, neck supple without masses PULM: Wheezing bilaterally  CV: RRR, slight systolic murmur, no JVD AB: BS+, soft, nontender, no hsm Ext: warm, no edema, + clubbing, no cyanosis Derm: no rash or skin breakdown Neuro: A&Ox4, CN II-XII intact, strength 5/5 in all 4 extremities  2013 CT chest reviewed> few scattered areas of consolidation/scarring in the right lung, scattered tree-in-bud changes in bases (minimal); question recent infection, mediastinal lymphadenopathy noted, no emphysema noted     Assessment & Plan:   COPD  (chronic obstructive pulmonary disease) Given her lengthy (and ongoing) smoking history, her hypoxemia, and wheezing on exam I agree with this diagnosis.    Getting a history is somewhat challenging here, but it doesn't sound like she has frequent exacerbations.  She needs to quit smoking and we needs pft's to assess the severity.  Plan: -full PFT -stop Flovent -trial Symbicort 160/4.5 2 puff bid -use oxygen regularly! -stop smoking!  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  However, I was not impressed by significant emphysema on her 2013 CT chest (which incidentally showed a few other old scars but no significant pulmonary fibrosis).  Plan: -plan for right heart catheterization (Will refer back to Dr. Blanchie Dessert group for this, preferably Dr. Rennis Golden or Bensimhon to perform) -Considering her underlying diastolic dysfunction, I would ask them to consider giving 500cc of fluid if the wedge is normal on that study  CHF,  acute diastolic, BNP 4k on admissio This is clearly contributing to her pulmonary hypertension, but it is worthwhile to know if her PVR is out of proportion to any elevation in her wedge.  Plan: -RHC -continue lasix per Dr. Blanchie Dessert recommendations    Updated Medication List Outpatient Encounter Prescriptions as of 08/14/2013  Medication Sig  . albuterol (PROVENTIL HFA;VENTOLIN HFA) 108 (90 BASE) MCG/ACT inhaler Inhale 2 puffs into the lungs every 4 (four) hours as needed. Shortness of breath and wheezing  Patient is using Pro-Air brand  . albuterol (PROVENTIL) (5 MG/ML) 0.5% nebulizer solution Take 0.5 mLs (2.5 mg total) by nebulization every 2 (two) hours as needed for wheezing or shortness of breath.  . ALPRAZolam (XANAX) 1 MG tablet  Take 1 mg by mouth 3 (three) times daily as needed for sleep or anxiety.  Marland Kitchen anastrozole (ARIMIDEX) 1 MG tablet Take  1 mg by mouth daily.   Marland Kitchen aspirin 325 MG EC tablet Take 325 mg by mouth daily.  . bisacodyl (DULCOLAX) 5 MG EC tablet Take 2 tablets (10 mg total) by mouth 2 (two) times daily.  . calcium-vitamin D (OSCAL WITH D) 250-125 MG-UNIT per tablet Take 1 tablet by mouth daily.  Marland Kitchen dextromethorphan-guaiFENesin (MUCINEX DM) 30-600 MG per 12 hr tablet Take 1 tablet by mouth 2 (two) times daily.  . diclofenac sodium (VOLTAREN) 1 % GEL Apply 1 application topically daily as needed. Hand pain  . furosemide (LASIX) 40 MG tablet Take 40 mg by mouth 2 (two) times daily.   Marland Kitchen ibuprofen (ADVIL,MOTRIN) 800 MG tablet Take 1 tablet (800 mg total) by mouth 3 (three) times daily as needed.  Marland Kitchen ipratropium (ATROVENT HFA) 17 MCG/ACT inhaler 2 puffs into long every 6 hour when necessary SOB  . LACTULOSE PO Take by mouth 2 (two) times daily.   . montelukast (SINGULAIR) 10 MG tablet Take 10 mg by mouth at bedtime.   . Multiple Vitamins-Minerals (MULTIVITAMIN WITH MINERALS) tablet Take 1 tablet by mouth daily.  . nortriptyline (PAMELOR) 25 MG capsule Take 25 mg by mouth at bedtime.  . Omega 3 1200 MG CAPS Take 1 capsule by mouth daily.  . ondansetron (ZOFRAN) 8 MG tablet Take 1 tablet (8 mg total) by mouth every 12 (twelve) hours as needed.  . OXYGEN-HELIUM IN Inhale 3 L into the lungs as needed.  . pantoprazole (PROTONIX) 40 MG tablet Take 40 mg by mouth daily.  . potassium chloride SA (K-DUR,KLOR-CON) 20 MEQ tablet Take 20 mEq by mouth daily.  . Zoledronic Acid (ZOMETA IV) Inject into the vein every 6 (six) months.  . [DISCONTINUED] Fluticasone Propionate, Inhal, (FLOVENT DISKUS IN) Inhale 2 puffs into the lungs daily.  . Butalbital-APAP-Caffeine (FIORICET PO) Take 650 mg by mouth 4 (four) times daily as needed.

## 2013-08-14 NOTE — Assessment & Plan Note (Addendum)
This is clearly contributing to her pulmonary hypertension, but it is worthwhile to know if her PVR is out of proportion to any elevation in her wedge.  Plan: -RHC -continue lasix per Dr. Blanchie Dessert recommendations

## 2013-08-17 ENCOUNTER — Ambulatory Visit
Admission: RE | Admit: 2013-08-17 | Discharge: 2013-08-17 | Disposition: A | Discharge status: Home / Self Care | Payer: Medicare Other | Source: Ambulatory Visit | Attending: Internal Medicine | Admitting: Internal Medicine

## 2013-08-17 DIAGNOSIS — Z853 Personal history of malignant neoplasm of breast: Secondary | ICD-10-CM

## 2013-08-20 ENCOUNTER — Other Ambulatory Visit: Payer: Self-pay | Admitting: Physician Assistant

## 2013-08-20 DIAGNOSIS — Z853 Personal history of malignant neoplasm of breast: Secondary | ICD-10-CM

## 2013-08-20 DIAGNOSIS — M8580 Other specified disorders of bone density and structure, unspecified site: Secondary | ICD-10-CM

## 2013-08-21 ENCOUNTER — Other Ambulatory Visit (HOSPITAL_BASED_OUTPATIENT_CLINIC_OR_DEPARTMENT_OTHER): Payer: Medicare Other | Admitting: Lab

## 2013-08-21 ENCOUNTER — Ambulatory Visit (HOSPITAL_BASED_OUTPATIENT_CLINIC_OR_DEPARTMENT_OTHER): Payer: Medicare Other

## 2013-08-21 ENCOUNTER — Telehealth: Payer: Self-pay | Admitting: *Deleted

## 2013-08-21 ENCOUNTER — Ambulatory Visit (HOSPITAL_BASED_OUTPATIENT_CLINIC_OR_DEPARTMENT_OTHER): Payer: Medicare Other | Admitting: Oncology

## 2013-08-21 VITALS — BP 104/68 | HR 94 | Temp 97.8°F | Resp 19 | Ht 62.0 in | Wt 109.6 lb

## 2013-08-21 DIAGNOSIS — Z853 Personal history of malignant neoplasm of breast: Secondary | ICD-10-CM

## 2013-08-21 DIAGNOSIS — C50412 Malignant neoplasm of upper-outer quadrant of left female breast: Secondary | ICD-10-CM

## 2013-08-21 DIAGNOSIS — M899 Disorder of bone, unspecified: Secondary | ICD-10-CM

## 2013-08-21 DIAGNOSIS — J449 Chronic obstructive pulmonary disease, unspecified: Secondary | ICD-10-CM

## 2013-08-21 DIAGNOSIS — C50919 Malignant neoplasm of unspecified site of unspecified female breast: Secondary | ICD-10-CM

## 2013-08-21 DIAGNOSIS — M8580 Other specified disorders of bone density and structure, unspecified site: Secondary | ICD-10-CM

## 2013-08-21 DIAGNOSIS — C50411 Malignant neoplasm of upper-outer quadrant of right female breast: Secondary | ICD-10-CM | POA: Insufficient documentation

## 2013-08-21 LAB — CBC WITH DIFFERENTIAL/PLATELET
BASO%: 0.3 % (ref 0.0–2.0)
Basophils Absolute: 0 10e3/uL (ref 0.0–0.1)
EOS%: 0.9 % (ref 0.0–7.0)
Eosinophils Absolute: 0.1 10e3/uL (ref 0.0–0.5)
HCT: 39 % (ref 34.8–46.6)
HGB: 12.8 g/dL (ref 11.6–15.9)
LYMPH%: 10.4 % — ABNORMAL LOW (ref 14.0–49.7)
MCH: 31.6 pg (ref 25.1–34.0)
MCHC: 32.8 g/dL (ref 31.5–36.0)
MCV: 96.5 fL (ref 79.5–101.0)
MONO#: 0.5 10e3/uL (ref 0.1–0.9)
MONO%: 5 % (ref 0.0–14.0)
NEUT#: 8.2 10e3/uL — ABNORMAL HIGH (ref 1.5–6.5)
NEUT%: 83.4 % — ABNORMAL HIGH (ref 38.4–76.8)
Platelets: 284 10e3/uL (ref 145–400)
RBC: 4.05 10e6/uL (ref 3.70–5.45)
RDW: 14 % (ref 11.2–14.5)
WBC: 9.8 10e3/uL (ref 3.9–10.3)
lymph#: 1 10e3/uL (ref 0.9–3.3)

## 2013-08-21 LAB — COMPREHENSIVE METABOLIC PANEL (CC13)
ALT: 27 U/L (ref 0–55)
AST: 21 U/L (ref 5–34)
Albumin: 3.2 g/dL — ABNORMAL LOW (ref 3.5–5.0)
Alkaline Phosphatase: 87 U/L (ref 40–150)
Anion Gap: 12 meq/L — ABNORMAL HIGH (ref 3–11)
BUN: 17.2 mg/dL (ref 7.0–26.0)
CO2: 34 meq/L — ABNORMAL HIGH (ref 22–29)
Calcium: 11 mg/dL — ABNORMAL HIGH (ref 8.4–10.4)
Chloride: 95 meq/L — ABNORMAL LOW (ref 98–109)
Creatinine: 1 mg/dL (ref 0.6–1.1)
Glucose: 119 mg/dL (ref 70–140)
Potassium: 3.8 meq/L (ref 3.5–5.1)
Sodium: 142 meq/L (ref 136–145)
Total Bilirubin: 0.4 mg/dL (ref 0.20–1.20)
Total Protein: 8.2 g/dL (ref 6.4–8.3)

## 2013-08-21 MED ORDER — SODIUM CHLORIDE 0.9 % IV SOLN
Freq: Once | INTRAVENOUS | Status: AC
  Administered 2013-08-21: 16:00:00 via INTRAVENOUS

## 2013-08-21 MED ORDER — ZOLEDRONIC ACID 4 MG/5ML IV CONC
4.0000 mg | Freq: Once | INTRAVENOUS | Status: AC
  Administered 2013-08-21: 4 mg via INTRAVENOUS
  Filled 2013-08-21: qty 5

## 2013-08-21 NOTE — Patient Instructions (Signed)
Zoledronic Acid injection (Hypercalcemia, Oncology) What is this medicine? ZOLEDRONIC ACID (ZOE le dron ik AS id) lowers the amount of calcium loss from bone. It is used to treat too much calcium in your blood from cancer. It is also used to prevent complications of cancer that has spread to the bone. This medicine may be used for other purposes; ask your health care provider or pharmacist if you have questions. What should I tell my health care provider before I take this medicine? They need to know if you have any of these conditions: -aspirin-sensitive asthma -dental disease -kidney disease -an unusual or allergic reaction to zoledronic acid, other medicines, foods, dyes, or preservatives -pregnant or trying to get pregnant -breast-feeding How should I use this medicine? This medicine is for infusion into a vein. It is given by a health care professional in a hospital or clinic setting. Talk to your pediatrician regarding the use of this medicine in children. Special care may be needed. Overdosage: If you think you have taken too much of this medicine contact a poison control center or emergency room at once. NOTE: This medicine is only for you. Do not share this medicine with others. What if I miss a dose? It is important not to miss your dose. Call your doctor or health care professional if you are unable to keep an appointment. What may interact with this medicine? -certain antibiotics given by injection -NSAIDs, medicines for pain and inflammation, like ibuprofen or naproxen -some diuretics like bumetanide, furosemide -teriparatide -thalidomide This list may not describe all possible interactions. Give your health care provider a list of all the medicines, herbs, non-prescription drugs, or dietary supplements you use. Also tell them if you smoke, drink alcohol, or use illegal drugs. Some items may interact with your medicine. What should I watch for while using this medicine? Visit  your doctor or health care professional for regular checkups. It may be some time before you see the benefit from this medicine. Do not stop taking your medicine unless your doctor tells you to. Your doctor may order blood tests or other tests to see how you are doing. Women should inform their doctor if they wish to become pregnant or think they might be pregnant. There is a potential for serious side effects to an unborn child. Talk to your health care professional or pharmacist for more information. You should make sure that you get enough calcium and vitamin D while you are taking this medicine. Discuss the foods you eat and the vitamins you take with your health care professional. Some people who take this medicine have severe bone, joint, and/or muscle pain. This medicine may also increase your risk for a broken thigh bone. Tell your doctor right away if you have pain in your upper leg or groin. Tell your doctor if you have any pain that does not go away or that gets worse. What side effects may I notice from receiving this medicine? Side effects that you should report to your doctor or health care professional as soon as possible: -allergic reactions like skin rash, itching or hives, swelling of the face, lips, or tongue -anxiety, confusion, or depression -breathing problems -changes in vision -feeling faint or lightheaded, falls -jaw burning, cramping, pain -muscle cramps, stiffness, or weakness -trouble passing urine or change in the amount of urine Side effects that usually do not require medical attention (report to your doctor or health care professional if they continue or are bothersome): -bone, joint, or muscle pain -  fever -hair loss -irritation at site where injected -loss of appetite -nausea, vomiting -stomach upset -tired This list may not describe all possible side effects. Call your doctor for medical advice about side effects. You may report side effects to FDA at  1-800-FDA-1088. Where should I keep my medicine? This drug is given in a hospital or clinic and will not be stored at home. NOTE: This sheet is a summary. It may not cover all possible information. If you have questions about this medicine, talk to your doctor, pharmacist, or health care provider.  2013, [employer] Standard. (03/12/2011 9:06:58 AM)  

## 2013-08-21 NOTE — Telephone Encounter (Signed)
appts made and printed...td 

## 2013-08-21 NOTE — Progress Notes (Signed)
ID: [name]   DOB: 1946-04-08  MR#: [id number]  WUJ#:[id number]  PCP: Orland Penman, MD GYN:  None SU: in [place] OTHER MD:  Cardiologist   HISTORY OF PRESENT ILLNESS:  [name] is a 67 year old [place] woman, who was diagnosed with breast cancer in November 2008.  She initially presented with a right breast mass, which was biopsied.  Subsequently, she had a lumpectomy and lymph node dissection on 09/08/2007.  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  She apparently had 8/14 involved lymph nodes.  She subsequently underwent adjuvant chemotherapy with Taxotere, Carboplatin and Herceptin.  This was complicated by a bowel infarction and she ultimately required resection and had a diverting ileostomy which ultimately was reversed after 8 months.  She was hospitalized for 5 weeks and was in rehab for 4 weeks.  She also had a hernia that required opening and ongoing dressings.  She completed chemotherapy after she recovered from all that and then had a year of Herceptin which she completed in May 2010.  She subsequently had radiation therapy to the right breast and axilla.  She was started on Arimidex and has been on Arimidex ever since.    INTERVAL HISTORY:   [name] returns today for followup of her breast cancer. Interval history is for a recent hospitalization for a variety of medical problems including encephalopathy felt at least partly to be due to pain medications. She tells me she is tolerating the anastrozole with no side effects. She received zolendronate April of 2014 began with no complaints other than perhaps feeling a little tired the second day  REVIEW OF SYSTEMS: She tells me she needs reading glasses. She is short of breath all the time and works oxygen about 18 hours a day. She keeps a dry cough which is occasionally productive of clear  phlegm. She bruises easily. 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 She feels anxious, but not depressed. She has occasional headaches and is a ready scheduled for a headache clinic evaluation next week A detailed review of systems was otherwise noncontributory  PAST MEDICAL HISTORY: Past Medical History  Diagnosis Date  . Angina   . Heart murmur   . Depressed   . Coronary artery disease 2006    2 stents w/previous MI  . Hypertension   . Migraine   . Anxiety   . Myocardial infarction 2006    NSTEMI  . COPD (chronic obstructive pulmonary disease)     oxygen-dependent 4LPM [place]  . Moderate to severe pulmonary hypertension   . Diastolic CHF   . Cancer of breast dx'd 2008    RT Breast  . Aortic stenosis     mild  . Dyslipidemia   . NSVT (nonsustained ventricular tachycardia)     h/o  . History of nuclear stress test 08/03/2011    attenuation at apex - no perfusion defects     PAST SURGICAL HISTORY: Past Surgical History  Procedure Laterality Date  . Breast lumpectomy Right 2008  . Cardiac catheterization  2006  . Cholecystectomy  2011  . Abdominal surgery  2009  . Transthoracic echocardiogram  11/12/2011    EF 55-60%, normal systolic function, grade 1 diastolic dysfunction; ventricular septal flattening (D-sign); mild AS; trace-mild MR; LA mildly dilated; RV mod dilated; RA mod dilated; severe pulm HTN; elevated CVP    FAMILY HISTORY Family History  Problem Relation Age  of Onset  . Schizophrenia Sister   . Alzheimer's disease Mother   . Hyperlipidemia Brother   . Heart disease Sister   . Diabetes Sister     GYNECOLOGIC HISTORY: She is G4, P3.  Menarche age 42.  Menopause at age 64.  No history of hormone replacement therapy.   SOCIAL HISTORY: (Updated November 2014)  She is widowed. She tells me her daughter [name] has bipolar disorder and is currently incarcerated. The 2 grandchildren were sent to [place]. The patient is currently staying with neighbors.      ADVANCED  DIRECTIVES:  HEALTH MAINTENANCE: History  Substance Use Topics  . Smoking status: Current Every Day Smoker -- 0.25 packs/day for 47 years    Types: Cigarettes  . Smokeless tobacco: Never Used     Comment: "once in a blue moon"  . Alcohol Use: Yes     Comment: wine with dinner occasionally     Colonoscopy:  Not had.  PAP: 2010.  Advised of the importance  Bone density: 11/01/11/ osteopenia  Lipid panel:  Managed by Dr. Clent Ridges  Allergies  Allergen Reactions  . Ceftriaxone Other (See Comments)    "Blow up like a balloon"    Current Outpatient Prescriptions  Medication Sig Dispense Refill  . albuterol (PROVENTIL HFA;VENTOLIN HFA) 108 (90 BASE) MCG/ACT inhaler Inhale 2 puffs into the lungs every 4 (four) hours as needed. Shortness of breath and wheezing  Patient is using Pro-Air brand      . albuterol (PROVENTIL) (5 MG/ML) 0.5% nebulizer solution Take 0.5 mLs (2.5 mg total) by nebulization every 2 (two) hours as needed for wheezing or shortness of breath.  20 mL  12  . ALPRAZolam (XANAX) 1 MG tablet Take 1 mg by mouth 3 (three) times daily as needed for sleep or anxiety.      Marland Kitchen anastrozole (ARIMIDEX) 1 MG tablet Take 1 mg by mouth daily.       Marland Kitchen aspirin 325 MG EC tablet Take 325 mg by mouth daily.      . bisacodyl (DULCOLAX) 5 MG EC tablet Take 2 tablets (10 mg total) by mouth 2 (two) times daily.  30 tablet  0  . budesonide-formoterol (SYMBICORT) 160-4.5 MCG/ACT inhaler Inhale 2 puffs into the lungs 2 (two) times daily.  1 Inhaler  2  . Butalbital-APAP-Caffeine (FIORICET PO) Take 650 mg by mouth 4 (four) times daily as needed.      . calcium-vitamin D (OSCAL WITH D) 250-125 MG-UNIT per tablet Take 1 tablet by mouth daily.      Marland Kitchen dextromethorphan-guaiFENesin (MUCINEX DM) 30-600 MG per 12 hr tablet Take 1 tablet by mouth 2 (two) times daily.  60 tablet  0  . diclofenac sodium (VOLTAREN) 1 % GEL Apply 1 application topically daily as needed. Hand pain      . furosemide (LASIX) 40 MG  tablet Take 40 mg by mouth 2 (two) times daily.       Marland Kitchen ibuprofen (ADVIL,MOTRIN) 800 MG tablet Take 1 tablet (800 mg total) by mouth 3 (three) times daily as needed.  30 tablet  0  . ipratropium (ATROVENT HFA) 17 MCG/ACT inhaler 2 puffs into long every 6 hour when necessary SOB  2 Inhaler  0  . LACTULOSE PO Take by mouth 2 (two) times daily.       . montelukast (SINGULAIR) 10 MG tablet Take 10 mg by mouth at bedtime.       . Multiple Vitamins-Minerals (MULTIVITAMIN WITH MINERALS) tablet Take 1  tablet by mouth daily.      . nortriptyline (PAMELOR) 25 MG capsule Take 25 mg by mouth at bedtime.      . Omega 3 1200 MG CAPS Take 1 capsule by mouth daily.      . ondansetron (ZOFRAN) 8 MG tablet Take 1 tablet (8 mg total) by mouth every 12 (twelve) hours as needed.  30 tablet  3  . OXYGEN-HELIUM IN Inhale 3 L into the lungs as needed.      . pantoprazole (PROTONIX) 40 MG tablet Take 40 mg by mouth daily.      . potassium chloride SA (K-DUR,KLOR-CON) 20 MEQ tablet Take 20 mEq by mouth daily.      . Zoledronic Acid (ZOMETA IV) Inject into the vein every 6 (six) months.       No current facility-administered medications for this visit.    OBJECTIVE: Middle-aged white woman who appears older than stated age 31 Vitals:   08/21/13 1402  BP: 104/68  Pulse: 94  Temp: 97.8 F (36.6 C)  Resp: 19     Body mass index is 20.04 kg/(m^2).    ECOG FS:  2  Sclerae unicteric, pupils equal and round Oropharynx clear and moist-- no thrush No cervical or supraclavicular adenopathy Lungs no rales or rhonchi Heart regular rate and rhythm Abd soft, nontender, positive bowel sounds MSK no focal spinal tenderness, no upper extremity lymphedema Neuro: nonfocal, well oriented, appropriate affect Breasts: The right breast is status post lumpectomy and radiation. There is no evidence of local recurrence. The right axilla is benign The left breast is unremarkable.   LAB RESULTS: Lab Results  Component Value  Date   WBC 9.8 08/21/2013   NEUTROABS 8.2* 08/21/2013   HGB 12.8 08/21/2013   HCT 39.0 08/21/2013   MCV 96.5 08/21/2013   PLT 284 08/21/2013      Chemistry      Component Value Date/Time   NA 144 06/25/2013 0515   K 3.1* 06/25/2013 0515   CL 101 06/25/2013 0515   CO2 34* 06/25/2013 0515   BUN 13 06/25/2013 0515   CREATININE 0.64 06/25/2013 0515      Component Value Date/Time   CALCIUM 9.1 06/25/2013 0515   ALKPHOS 115 06/25/2013 0515   AST 74* 06/25/2013 0515   ALT 795* 06/25/2013 0515   BILITOT 0.3 06/25/2013 0515       Lab Results  Component Value Date   LABCA2 24 07/28/2011    No components found with this basename: [id number]    No results found for this basename: INR,  in the last 168 hours  Urinalysis    Component Value Date/Time   COLORURINE YELLOW 06/20/2013 0059   APPEARANCEUR TURBID* 06/20/2013 0059   LABSPEC >1.030* 06/20/2013 0059   PHURINE 5.0 06/20/2013 0059   GLUCOSEU 100* 06/20/2013 0059   HGBUR MODERATE* 06/20/2013 0059   BILIRUBINUR MODERATE* 06/20/2013 0059   KETONESUR 15* 06/20/2013 0059   PROTEINUR >300* 06/20/2013 0059   UROBILINOGEN 1.0 06/20/2013 0059   NITRITE POSITIVE* 06/20/2013 0059   LEUKOCYTESUR TRACE* 06/20/2013 0059    STUDIES: Mm Digital Diagnostic Bilat  08/17/2013   CLINICAL DATA:  The patient presents for annual diagnostic followup post right lumpectomy in 2006 with subsequent radiation and chemotherapy.  EXAM: DIGITAL DIAGNOSTIC  BILATERAL MAMMOGRAM WITH CAD  COMPARISON:  06/18/2011 and prior  ACR Breast Density Category b: There are scattered areas of fibroglandular density.  FINDINGS: Conventional mammographic views and tangential spot compression view over the lumpectomy  site demonstrates expected postlumpectomy changes in the left upper outer breast. No significant change in the postsurgical appearance is identified. Some interval decrease in skin thickening of the right breast has occurred since the prior exam. No worrisome masses, areas of  non surgical architectural distortion or abnormal calcification are identified on either side.  Mammographic images were processed with CAD.  IMPRESSION: No mammographic features worrisome for malignancy. Expected postsurgical change in the right breast.  RECOMMENDATION: One further followup diagnostic evaluation is recommended in 1 year (07/2014) to constitute a complete 7 year postlumpectomy diagnostic surveillance.  I have discussed the findings and recommendations with the patient. Results were also provided in writing at the conclusion of the visit. If applicable, a reminder letter will be sent to the patient regarding the next appointment.  BI-RADS CATEGORY  2: Benign Finding(s)   Electronically Signed   By: Leda Gauze M.D.   On: 08/17/2013 14:30    ASSESSMENT: 67 y.o. [place] woman: #1  S/P right breast lumpectomy with lymph node dissection on 09/08/07 in [place] for a T1c N2a IDC of the right breast, ER+, PR-, HER2 3+ with 8 of 14 nodes involved.  #2  She completed [facility] with Herceptin for one year in May 2010.  #3  She underwent radiation therapy in May of 2010.  #4  She started Arimidex in 2010 and has tolerated it well since.   #5 osteopenia. Received zolendronate April 2014  PLAN:  [name] is doing very well from a breast cancer point of view and she will complete her 5 years of anastrozole June of 2015.  She will receive zolendronate today and again in May of 2015. At that point she will "graduate" from followup here.  I again advised her strongly to completely discontinue smoking. She is "trying hard" but is not interested in any particular program at this point. She knows to call for any problems that may develop before her next visit here.   MAGRINAT,GUSTAV C    08/21/2013

## 2013-08-22 ENCOUNTER — Encounter (HOSPITAL_COMMUNITY): Payer: Self-pay | Admitting: *Deleted

## 2013-08-22 ENCOUNTER — Telehealth (HOSPITAL_COMMUNITY): Payer: Self-pay | Admitting: *Deleted

## 2013-08-22 NOTE — Telephone Encounter (Signed)
Per Dr Kendrick Fries pt needs RHC for pulm htn, cath sch for 12/10 at 12:30 pt aware, instructions reviewed with her and copy mailed

## 2013-08-24 ENCOUNTER — Telehealth: Payer: Self-pay | Admitting: *Deleted

## 2013-08-24 NOTE — Telephone Encounter (Signed)
Per staff message and POF I have scheduled appts.  JMW  

## 2013-08-27 ENCOUNTER — Encounter (HOSPITAL_COMMUNITY): Payer: Self-pay | Admitting: Pharmacist

## 2013-08-28 ENCOUNTER — Telehealth: Payer: Self-pay | Admitting: Internal Medicine

## 2013-08-28 NOTE — Telephone Encounter (Signed)
Please call-she need you to ask Dr Rennis Golden a question for her.

## 2013-08-28 NOTE — Telephone Encounter (Signed)
Returned call and pt verified x 2.  Pt stated she went to the pulmonologist (Dr. Kendrick Fries?) and he wants her to have a heart cath on the 10th (Dec).  Pt stated he sent her to a doctor w/ [facility] and she isn't comfortable with that b/c Dr. Rennis Golden knows her.  Pt wants to know if she should have the cath done and if so, if Dr. Rennis Golden will do it.  Pt informed Dr. Dorene Grebe, RN will be notified.  Pt verbalized understanding and agreed w/ plan.  Message forwarded to Dr. Dorene Grebe, RN.  Of note, pt scheduled for R heart cath on 12.10.14 w/ Dr. Gala Romney.

## 2013-08-30 LAB — HM MAMMOGRAPHY: HM Mammogram: NORMAL

## 2013-08-30 NOTE — Telephone Encounter (Signed)
Called back [name] on 12/4 - explained that Dr. Gala Romney is going to do her RHC as he is coordinating the pulmonary hypertension clinic and is connected to advanced therapies at [facility] which she may be eligible for. She is ok with this.  Dr. Rennis Golden

## 2013-08-31 ENCOUNTER — Encounter: Payer: Self-pay | Admitting: Neurology

## 2013-08-31 ENCOUNTER — Ambulatory Visit (INDEPENDENT_AMBULATORY_CARE_PROVIDER_SITE_OTHER): Payer: Medicare Other | Admitting: Neurology

## 2013-08-31 VITALS — BP 120/70 | HR 88 | Temp 98.0°F | Ht 62.0 in | Wt 113.0 lb

## 2013-08-31 DIAGNOSIS — G43709 Chronic migraine without aura, not intractable, without status migrainosus: Secondary | ICD-10-CM

## 2013-08-31 MED ORDER — NORTRIPTYLINE HCL 25 MG PO CAPS: 50.0000 mg | ORAL_CAPSULE | Freq: Every day | ORAL | Status: DC

## 2013-08-31 NOTE — Patient Instructions (Signed)
1.  Increase Pamelor to 50mg  daily.  Call in 4 weeks with an update. 2.  Take Excedrin at immediate onset of migraine. 3.  Limit use of pain relievers to no more than 2 days out of the week.  These medications include acetaminophen, ibuprofen, triptans and narcotics.  This will help reduce risk of rebound headaches. 4.  Keep a headache diary. 5.  Stay adequately hydrated. 6.  Maintain good sleep hygiene. 7.  Maintain proper stress management. 8.  Smoking cessation 9.  Stop caffeine intake. 10.  Follow up in 3 months.

## 2013-08-31 NOTE — Progress Notes (Signed)
NEUROLOGY CONSULTATION NOTE  LEVANA MINETTI MRN: [id number] DOB: February 28, 1946  Referring provider: Dr. Lonzo Cloud Primary care provider: Dr. Lonzo Cloud  Reason for consult:  Headache.  HISTORY OF PRESENT ILLNESS: [name] is a 67 year old right-handed woman with history of breast cancer, hypertension, CAD, COPD who presents for headache.  Records and images were personally reviewed where available.    Onset: 20s Location:  Back of head radiating up to the front Quality:  pounding Intensity:  5/10 Aura:  no Associated symptoms:  Used to have severe nausea, vomiting, dizziness, photophobia and phonophobia up until 1993 when she started Pamelor.  Now, no associated symptoms. Duration:  One hour without medication (40 minutes with medication) Frequency:  Three times a week Triggers:  stress Relieving factors:  medication Activity:  Able to perform daily activities  Past abortive therapy:  Fioricet almost daily, fioricet with codeine (was taking it once a week), Midrin (made her feel numb), Advil (not effective), Tylenol (not effective) Past preventative therapy:  none  Current abortive therapy: Excedrin (just started taking.  Helps) Current preventative therapy:  Pamelor 25mg  daily:  Headaches are a lot better since starting Pamelor in 1993.  Caffeine:  3 cups daily Smoking:  yes Sleep hygiene:  Sometimes good, usually well-rested Stress/depression:  stress Family history of headache:  Mother, sisters, daughter.  She was admitted to the hospital from 06/19/13 to 06/25/13 for acute encephalopathy and acute respiratory failure, due to polysubstance overdose that complicated her underlying COPD and pulmonary hypertension.  Tox screen was positive for barbiturates, opioids, and benzodiazepines.  She was also in acute liver failure, likely due to Lipitor or acetaminophen overuse.  AST 946 and ALT 2872 on 06/22/13.  Ammonia on 06/19/13 was 91.  On admission, she was hypoglycemic in  setting of acute liver failure.  She was also in acute kidney insufficiency.  On 06/21/13, Na 141, K 3.9, CO2 24, glucose 164, BUN 43, Cr 1.84.  CT of head on 06/19/13 was reportedly unremarkable.  LFTs normalized by discharge.  EKG (08/09/13): NSR, QT/QTc 350/425.  PAST MEDICAL HISTORY: Past Medical History  Diagnosis Date  . Angina   . Heart murmur   . Depressed   . Coronary artery disease 2006    2 stents w/previous MI  . Hypertension   . Migraine   . Anxiety   . Myocardial infarction 2006    NSTEMI  . COPD (chronic obstructive pulmonary disease)     oxygen-dependent 4LPM [place]  . Moderate to severe pulmonary hypertension   . Diastolic CHF   . Cancer of breast dx'd 2008    RT Breast  . Aortic stenosis     mild  . Dyslipidemia   . NSVT (nonsustained ventricular tachycardia)     h/o  . History of nuclear stress test 08/03/2011    attenuation at apex - no perfusion defects     PAST SURGICAL HISTORY: Past Surgical History  Procedure Laterality Date  . Breast lumpectomy Right 2008  . Cardiac catheterization  2006  . Cholecystectomy  2011  . Abdominal surgery  2009  . Transthoracic echocardiogram  11/12/2011    EF 55-60%, normal systolic function, grade 1 diastolic dysfunction; ventricular septal flattening (D-sign); mild AS; trace-mild MR; LA mildly dilated; RV mod dilated; RA mod dilated; severe pulm HTN; elevated CVP    MEDICATIONS: Current Outpatient Prescriptions on File Prior to Visit  Medication Sig Dispense Refill  . albuterol (PROVENTIL HFA;VENTOLIN HFA) 108 (90 BASE) MCG/ACT inhaler Inhale  2 puffs into the lungs every 4 (four) hours as needed for shortness of breath. Shortness of breath and wheezing  Patient is using Pro-Air brand      . albuterol (PROVENTIL) (5 MG/ML) 0.5% nebulizer solution Take 2.5 mg by nebulization every 2 (two) hours as needed for wheezing or shortness of breath.      . ALPRAZolam (XANAX) 1 MG tablet Take 1 mg by mouth 3 (three) times daily as  needed for sleep or anxiety.      Marland Kitchen anastrozole (ARIMIDEX) 1 MG tablet Take 1 mg by mouth at bedtime.       Marland Kitchen aspirin 325 MG EC tablet Take 325 mg by mouth daily.      . bisacodyl (DULCOLAX) 5 MG EC tablet Take 10 mg by mouth 2 (two) times daily as needed (for constipation).      . budesonide-formoterol (SYMBICORT) 160-4.5 MCG/ACT inhaler Inhale 2 puffs into the lungs 2 (two) times daily.      . Butalbital-APAP-Caffeine (FIORICET PO) Take 1 capsule by mouth daily as needed (for migraines).       . calcium-vitamin D (OSCAL WITH D) 250-125 MG-UNIT per tablet Take 1 tablet by mouth daily.      . diclofenac sodium (VOLTAREN) 1 % GEL Apply 1 application topically daily as needed (apply to hands). Hand pain      . furosemide (LASIX) 40 MG tablet Take 40 mg by mouth 2 (two) times daily.       Marland Kitchen ipratropium (ATROVENT HFA) 17 MCG/ACT inhaler Inhale 2 puffs into the lungs every 4 (four) hours as needed (for shortness of breath).      . lactulose (CHRONULAC) 10 GM/15ML solution Take 20 g by mouth 2 (two) times daily as needed (constipation).      . montelukast (SINGULAIR) 10 MG tablet Take 10 mg by mouth at bedtime.       . Multiple Vitamins-Minerals (MULTIVITAMIN WITH MINERALS) tablet Take 1 tablet by mouth daily.      . Omega 3 1200 MG CAPS Take 1,200 mg by mouth daily.       . ondansetron (ZOFRAN) 8 MG tablet Take 8 mg by mouth 2 (two) times daily as needed for nausea or vomiting.      . OXYGEN-HELIUM IN Inhale 3 L into the lungs as needed.      . pantoprazole (PROTONIX) 40 MG tablet Take 40 mg by mouth daily.      . potassium chloride SA (K-DUR,KLOR-CON) 20 MEQ tablet Take 20 mEq by mouth daily.      . Zoledronic Acid (ZOMETA IV) Inject 4 mg into the vein every 6 (six) months.        No current facility-administered medications on file prior to visit.    ALLERGIES: Allergies  Allergen Reactions  . Ceftriaxone Other (See Comments)    "Blow up like a balloon"    FAMILY HISTORY: Family History    Problem Relation Age of Onset  . Schizophrenia Sister   . Alzheimer's disease Mother   . Hyperlipidemia Brother   . Heart disease Sister   . Diabetes Sister     SOCIAL HISTORY: History   Social History  . Marital Status: Single    Spouse Name: N/A    Number of Children: 3  . Years of Education: 12   Occupational History  . Not on file.   Social History Main Topics  . Smoking status: Current Every Day Smoker -- 0.25 packs/day for 47 years  Types: Cigarettes  . Smokeless tobacco: Never Used     Comment: "once in a blue moon"  . Alcohol Use: Yes     Comment: wine with dinner occasionally  . Drug Use: No  . Sexual Activity: Not Currently     Comment: 3 cig a day   Other Topics Concern  . Not on file   Social History Narrative  . No narrative on file    REVIEW OF SYSTEMS: Constitutional: No fevers, chills, or sweats, no generalized fatigue, change in appetite Eyes: No visual changes, double vision, eye pain Ear, nose and throat: No hearing loss, ear pain, nasal congestion, sore throat Cardiovascular: No chest pain, palpitations Respiratory:  Does have shortness of breath (on O2) GastrointestinaI: No nausea, vomiting, diarrhea, abdominal pain, fecal incontinence Genitourinary:  No dysuria, urinary retention or frequency Musculoskeletal:  No neck pain, back pain Integumentary: No rash, pruritus, skin lesions Neurological: as above Psychiatric: Has anxiety.  No depression. Endocrine: No palpitations, fatigue, diaphoresis, mood swings, change in appetite, change in weight, increased thirst Hematologic/Lymphatic:  No anemia, purpura, petechiae. Allergic/Immunologic: no itchy/runny eyes, nasal congestion, recent allergic reactions, rashes  PHYSICAL EXAM: Filed Vitals:   08/31/13 1409  BP: 120/70  Pulse: 88  Temp: 98 F (36.7 C)   General: No acute distress Head:  Normocephalic/atraumatic Neck: supple, no paraspinal tenderness, full range of motion Back: No  paraspinal tenderness Heart: regular rate and rhythm Lungs: No rales or rhonchi  Vascular: No carotid bruits. Neurological Exam: Mental status: alert and oriented to person, place, and time, speech fluent and not dysarthric, language intact. Cranial nerves: CN I: not tested CN II: pupils equal, round and reactive to light, visual fields intact, fundi unremarkable. CN III, IV, VI:  full range of motion, no nystagmus, no ptosis CN V: facial sensation intact CN VII: upper and lower face symmetric CN VIII: hearing intact CN IX, X: gag intact, uvula midline CN XI: sternocleidomastoid and trapezius muscles intact CN XII: tongue midline Bulk & Tone: normal, no fasciculations. Motor: 5/5 throughout Sensation: temperature and vibration intact Deep Tendon Reflexes: 2+ throughout, toes down Finger to nose testing: normal Gait: normal stance and stride.  Able to walk in tandem. Romberg negative.  IMPRESSION: Transformed migraine  PLAN: 1.  Increase Pamelor to 50mg  qhs (QT interval looks okay). Call in 4 weeks with update. 2.  Take Excedrin for acute attacks but must limit use (no more than 2 days out of week). 3.  Recommend stopping caffeine and smoking 4.  Sleep hygiene and stress management 5.  Keep hydrated. 6.  Follow up in 3 months.   Thank you for allowing me to take part in the care of this patient.  Shon Millet, DO  CC:  Terrace Arabia, MD (PCP)  Zoila Shutter, MD (cardiologist)

## 2013-09-04 ENCOUNTER — Other Ambulatory Visit (HOSPITAL_COMMUNITY): Payer: Self-pay | Admitting: Anesthesiology

## 2013-09-04 DIAGNOSIS — R0602 Shortness of breath: Secondary | ICD-10-CM

## 2013-09-05 ENCOUNTER — Encounter (HOSPITAL_COMMUNITY)
Admission: RE | Disposition: A | Discharge status: Home / Self Care | Payer: Self-pay | Source: Ambulatory Visit | Attending: Internal Medicine

## 2013-09-05 ENCOUNTER — Ambulatory Visit (HOSPITAL_COMMUNITY)
Admission: RE | Admit: 2013-09-05 | Discharge: 2013-09-05 | Disposition: A | Discharge status: Home / Self Care | Payer: Medicare Other | Source: Ambulatory Visit | Attending: Internal Medicine | Admitting: Internal Medicine

## 2013-09-05 DIAGNOSIS — R9389 Abnormal findings on diagnostic imaging of other specified body structures: Secondary | ICD-10-CM | POA: Insufficient documentation

## 2013-09-05 DIAGNOSIS — I5030 Unspecified diastolic (congestive) heart failure: Secondary | ICD-10-CM | POA: Insufficient documentation

## 2013-09-05 DIAGNOSIS — F17200 Nicotine dependence, unspecified, uncomplicated: Secondary | ICD-10-CM | POA: Insufficient documentation

## 2013-09-05 DIAGNOSIS — R0602 Shortness of breath: Secondary | ICD-10-CM

## 2013-09-05 DIAGNOSIS — J449 Chronic obstructive pulmonary disease, unspecified: Secondary | ICD-10-CM | POA: Insufficient documentation

## 2013-09-05 DIAGNOSIS — E785 Hyperlipidemia, unspecified: Secondary | ICD-10-CM | POA: Insufficient documentation

## 2013-09-05 DIAGNOSIS — Z79899 Other long term (current) drug therapy: Secondary | ICD-10-CM | POA: Insufficient documentation

## 2013-09-05 DIAGNOSIS — Z7982 Long term (current) use of aspirin: Secondary | ICD-10-CM | POA: Insufficient documentation

## 2013-09-05 DIAGNOSIS — I2510 Atherosclerotic heart disease of native coronary artery without angina pectoris: Secondary | ICD-10-CM | POA: Insufficient documentation

## 2013-09-05 DIAGNOSIS — J4489 Other specified chronic obstructive pulmonary disease: Secondary | ICD-10-CM | POA: Insufficient documentation

## 2013-09-05 DIAGNOSIS — I359 Nonrheumatic aortic valve disorder, unspecified: Secondary | ICD-10-CM | POA: Insufficient documentation

## 2013-09-05 DIAGNOSIS — I252 Old myocardial infarction: Secondary | ICD-10-CM | POA: Insufficient documentation

## 2013-09-05 DIAGNOSIS — F411 Generalized anxiety disorder: Secondary | ICD-10-CM | POA: Insufficient documentation

## 2013-09-05 DIAGNOSIS — I10 Essential (primary) hypertension: Secondary | ICD-10-CM | POA: Insufficient documentation

## 2013-09-05 DIAGNOSIS — F3289 Other specified depressive episodes: Secondary | ICD-10-CM | POA: Insufficient documentation

## 2013-09-05 DIAGNOSIS — F329 Major depressive disorder, single episode, unspecified: Secondary | ICD-10-CM | POA: Insufficient documentation

## 2013-09-05 HISTORY — PX: RIGHT HEART CATHETERIZATION: SHX5447

## 2013-09-05 LAB — CBC
HCT: 41.3 % (ref 36.0–46.0)
Hemoglobin: 13.4 g/dL (ref 12.0–15.0)
MCH: 32 pg (ref 26.0–34.0)
MCHC: 32.4 g/dL (ref 30.0–36.0)
MCV: 98.6 fL (ref 78.0–100.0)
Platelets: 213 10*3/uL (ref 150–400)
RBC: 4.19 MIL/uL (ref 3.87–5.11)
RDW: 14.1 % (ref 11.5–15.5)
WBC: 6.9 10*3/uL (ref 4.0–10.5)

## 2013-09-05 LAB — BASIC METABOLIC PANEL
BUN: 21 mg/dL (ref 6–23)
CO2: 32 mEq/L (ref 19–32)
Calcium: 10.3 mg/dL (ref 8.4–10.5)
Chloride: 98 mEq/L (ref 96–112)
Creatinine, Ser: 0.73 mg/dL (ref 0.50–1.10)
GFR calc Af Amer: >90 mL/min (ref >90)
GFR calc non Af Amer: 86 mL/min — ABNORMAL LOW (ref >90)
Glucose, Bld: 75 mg/dL (ref 70–99)
Potassium: 4.8 mEq/L (ref 3.5–5.1)
Sodium: 136 mEq/L (ref 135–145)

## 2013-09-05 LAB — POCT I-STAT 3, VENOUS BLOOD GAS (G3P V)
Bicarbonate: 26.5 mEq/L — ABNORMAL HIGH (ref 20.0–24.0)
O2 Saturation: 77 %
TCO2: 28 mmol/L (ref 0–100)
pCO2, Ven: 52 mmHg — ABNORMAL HIGH (ref 45.0–50.0)
pH, Ven: 7.315 — ABNORMAL HIGH (ref 7.250–7.300)
pO2, Ven: 46 mmHg — ABNORMAL HIGH (ref 30.0–45.0)

## 2013-09-05 LAB — PROTIME-INR
INR: 0.87 (ref 0.00–1.49)
Prothrombin Time: 11.7 seconds (ref 11.6–15.2)

## 2013-09-05 SURGERY — RIGHT HEART CATH
Anesthesia: LOCAL

## 2013-09-05 MED ORDER — SODIUM CHLORIDE 0.9 % IJ SOLN: 3.0000 mL | INTRAMUSCULAR | Status: DC | PRN

## 2013-09-05 MED ORDER — MIDAZOLAM HCL 2 MG/2ML IJ SOLN
INTRAMUSCULAR | Status: AC
  Filled 2013-09-05: qty 2

## 2013-09-05 MED ORDER — SODIUM CHLORIDE 0.9 % IJ SOLN: 3.0000 mL | Freq: Two times a day (BID) | INTRAMUSCULAR | Status: DC

## 2013-09-05 MED ORDER — SODIUM CHLORIDE 0.9 % IV SOLN
250.0000 mL | INTRAVENOUS | Status: DC | PRN
  Administered 2013-09-05: 1000 mL via INTRAVENOUS

## 2013-09-05 MED ORDER — ONDANSETRON HCL 4 MG/2ML IJ SOLN: 4.0000 mg | Freq: Four times a day (QID) | INTRAMUSCULAR | Status: DC | PRN

## 2013-09-05 MED ORDER — FENTANYL CITRATE 0.05 MG/ML IJ SOLN
INTRAMUSCULAR | Status: AC
  Filled 2013-09-05: qty 2

## 2013-09-05 MED ORDER — HEPARIN (PORCINE) IN NACL 2-0.9 UNIT/ML-% IJ SOLN
INTRAMUSCULAR | Status: AC
  Filled 2013-09-05: qty 500

## 2013-09-05 MED ORDER — ACETAMINOPHEN 325 MG PO TABS: 650.0000 mg | ORAL_TABLET | ORAL | Status: DC | PRN

## 2013-09-05 MED ORDER — SODIUM CHLORIDE 0.9 % IV SOLN: 250.0000 mL | INTRAVENOUS | Status: DC | PRN

## 2013-09-05 MED ORDER — LIDOCAINE HCL (PF) 1 % IJ SOLN
INTRAMUSCULAR | Status: AC
  Filled 2013-09-05: qty 30

## 2013-09-05 NOTE — CV Procedure (Addendum)
Cardiac Cath Procedure Note:  Indication:  Pulmonary HTN on echo   Procedures performed:  1) Right heart catheterization  Description of procedure:   The risks and indication of the procedure were explained. Consent was signed and placed on the chart. An appropriate timeout was taken prior to the procedure. The right neck was prepped and draped in the routine sterile fashion and anesthetized with 1% local lidocaine.   A 7 FR venous sheath was placed in the right internal jugular vein using a modified Seldinger technique. A standard Swan-Ganz catheter was used for the procedure.   Complications: None apparent.  Findings:  RA = 2 RV = 27/2/5 PA = 29/7 (19) PCW = 6 Fick cardiac output/index = 4.5/3.0 PVR = 2.9 WU Pulse ox = 98% (3L) PA sat = 70%, 78%  Assessment:  Normal RHC. No evidence of PAH.   Ok for patient to go home on SCAT bus after bedrest period complete.   Arvilla Meres MD 1:35 PM

## 2013-09-05 NOTE — Interval H&P Note (Signed)
History and Physical Interval Note:  Dr. Blanchie Dessert H&P from 08/10/2013 reviewed. As well as Dr. Ulyses Jarred note. [name] is a 67 y/o woman with CAD and severe COPD (I cannot find recent PFTs on chart yet - they are ordered). Who was found to have severe PAH on echo. She is referred for RHC to further evaluate.   If she has significant PAH will need to await PFTs to see if PAH out of proportion to degree of COPD. If so, may be a candidate for an inhaled prostanoid.   Barbara Boom Donnette Macmullen,MD 1:33 PM    09/05/2013 1:30 PM  [name]  has presented today for surgery, with the diagnosis of PAH  The various methods of treatment have been discussed with the patient and family. After consideration of risks, benefits and other options for treatment, the patient has consented to  Procedure(s): RIGHT HEART CATH (N/A) as a surgical intervention .  The patient's history has been reviewed, patient examined, no change in status, stable for surgery.  I have reviewed the patient's chart and labs.  Questions were answered to the patient's satisfaction.     Kahli Fitzgerald

## 2013-09-05 NOTE — Interval H&P Note (Signed)
History and Physical Interval Note:  09/05/2013 1:30 PM  [name]  has presented today for surgery, with the diagnosis of Pulmonary HTN The various methods of treatment have been discussed with the patient and family. After consideration of risks, benefits and other options for treatment, the patient has consented to  Procedure(s): RIGHT HEART CATH (N/A) as a surgical intervention .  The patient's history has been reviewed, patient examined, no change in status, stable for surgery.  I have reviewed the patient's chart and labs.  Questions were answered to the patient's satisfaction.     Ellinor Test

## 2013-09-05 NOTE — H&P (View-Only) (Signed)
OFFICE NOTE  Chief Complaint:  Hospital follow-up  Primary Care Physician: Raelyn Mora, Brent Bulla, MD  HPI:  [name] a pleasant 67 year old female with a history of COPD and severe pulmonary hypertension. Recently she was put on home oxygen and has been on an oxygen concentrator, which has made a marked improvement in her ability to get around. She has previously seen Dr. Delton Coombes with pulmonology; however, that did not go too well and she has basically given up on any further treatments for COPD other than her oxygen. She still uses nebulizers as needed when she is tight, and I recommended Claritin for seasonal allergies today. As you know, she also has mild aortic stenosis and we are continuing to follow that. She is a low-risk stress test in November 2012. Unfortunately she was recently admitted for altered mental status, possible Tylenol overdose, respiratory failure and liver failure. All of which seems to have resolved. She seems to be doing pre-well after discharge. I understand that she will have be having another followup with pulmonary next week.  PMHx:  Past Medical History  Diagnosis Date  . Angina   . Heart murmur   . Depressed   . Coronary artery disease 2006    2 stents w/previous MI  . Hypertension   . Migraine   . Anxiety   . Myocardial infarction 2006    NSTEMI  . COPD (chronic obstructive pulmonary disease)     oxygen-dependent 4LPM [place]  . Moderate to severe pulmonary hypertension   . Diastolic CHF   . Cancer of breast dx'd 2008    RT Breast  . Aortic stenosis     mild  . Dyslipidemia   . NSVT (nonsustained ventricular tachycardia)     h/o  . History of nuclear stress test 08/03/2011    attenuation at apex - no perfusion defects     Past Surgical History  Procedure Laterality Date  . Breast lumpectomy Right 2008  . Cardiac catheterization  2006  . Cholecystectomy  2011  . Abdominal surgery  2009  . Transthoracic echocardiogram  11/12/2011   EF 55-60%, normal systolic function, grade 1 diastolic dysfunction; ventricular septal flattening (D-sign); mild AS; trace-mild MR; LA mildly dilated; RV mod dilated; RA mod dilated; severe pulm HTN; elevated CVP    FAMHx:  Family History  Problem Relation Age of Onset  . Schizophrenia Sister   . Alzheimer's disease Mother   . Hyperlipidemia Brother   . Heart disease Sister   . Diabetes Sister     SOCHx:   reports that she has been smoking Cigarettes.  She has a 11.75 pack-year smoking history. She has never used smokeless tobacco. She reports that she drinks alcohol. She reports that she does not use illicit drugs.  ALLERGIES:  Allergies  Allergen Reactions  . Ceftriaxone Other (See Comments)    "Blow up like a balloon"    ROS: A comprehensive review of systems was negative except for: Respiratory: positive for dyspnea on exertion Gastrointestinal: positive for constipation  HOME MEDS: Current Outpatient Prescriptions  Medication Sig Dispense Refill  . albuterol (PROVENTIL HFA;VENTOLIN HFA) 108 (90 BASE) MCG/ACT inhaler Inhale 2 puffs into the lungs every 4 (four) hours as needed. Shortness of breath and wheezing  Patient is using Pro-Air brand      . albuterol (PROVENTIL) (5 MG/ML) 0.5% nebulizer solution Take 0.5 mLs (2.5 mg total) by nebulization every 2 (two) hours as needed for wheezing or shortness of breath.  20 mL  12  . ALPRAZolam (XANAX) 1 MG tablet Take 1 mg by mouth 3 (three) times daily as needed for sleep or anxiety.      Marland Kitchen anastrozole (ARIMIDEX) 1 MG tablet Take 1 mg by mouth daily.       Marland Kitchen aspirin 325 MG EC tablet Take 325 mg by mouth daily.      . bisacodyl (DULCOLAX) 5 MG EC tablet Take 2 tablets (10 mg total) by mouth 2 (two) times daily.  30 tablet  0  . Butalbital-APAP-Caffeine (FIORICET PO) Take 650 mg by mouth 4 (four) times daily as needed.      . calcium-vitamin D (OSCAL WITH D) 250-125 MG-UNIT per tablet Take 1 tablet by mouth daily.      Marland Kitchen  dextromethorphan-guaiFENesin (MUCINEX DM) 30-600 MG per 12 hr tablet Take 1 tablet by mouth 2 (two) times daily.  60 tablet  0  . diclofenac sodium (VOLTAREN) 1 % GEL Apply 1 application topically daily as needed. Hand pain      . Fluticasone Propionate, Inhal, (FLOVENT DISKUS IN) Inhale 2 puffs into the lungs daily.      . furosemide (LASIX) 40 MG tablet Take 40 mg by mouth 2 (two) times daily.       Marland Kitchen ibuprofen (ADVIL,MOTRIN) 800 MG tablet Take 1 tablet (800 mg total) by mouth 3 (three) times daily as needed.  30 tablet  0  . ipratropium (ATROVENT HFA) 17 MCG/ACT inhaler 2 puffs into long every 6 hour when necessary SOB  2 Inhaler  0  . LACTULOSE PO Take by mouth 2 (two) times daily.       . montelukast (SINGULAIR) 10 MG tablet Take 10 mg by mouth at bedtime.       . Multiple Vitamins-Minerals (MULTIVITAMIN WITH MINERALS) tablet Take 1 tablet by mouth daily.      . nortriptyline (PAMELOR) 25 MG capsule Take 25 mg by mouth at bedtime.      . Omega 3 1200 MG CAPS Take 1 capsule by mouth daily.      . ondansetron (ZOFRAN) 8 MG tablet Take 1 tablet (8 mg total) by mouth every 12 (twelve) hours as needed.  30 tablet  3  . OXYGEN-HELIUM IN Inhale 3 L into the lungs as needed.      . pantoprazole (PROTONIX) 40 MG tablet Take 40 mg by mouth daily.      . potassium chloride SA (K-DUR,KLOR-CON) 20 MEQ tablet Take 20 mEq by mouth daily.      . Zoledronic Acid (ZOMETA IV) Inject into the vein every 6 (six) months.       No current facility-administered medications for this visit.    LABS/IMAGING: No results found for this or any previous visit (from the past 48 hour(s)). No results found.  VITALS: BP 130/62  Pulse 89  Ht 5\' 2"  (1.575 m)  Wt 110 lb 12.8 oz (50.259 kg)  BMI 20.26 kg/m2  EXAM: General appearance: alert, appears older than stated age and mild distress Neck: no carotid bruit and no JVD Lungs: diminished breath sounds bilaterally and faint rhonchi Heart: regular rate and rhythm,  S1, S2 normal and systolic murmur: early systolic 2/6, crescendo at 2nd right intercostal space Abdomen: soft, non-tender; bowel sounds normal; no masses,  no organomegaly Extremities: extremities normal, atraumatic, no cyanosis or edema Pulses: 2+ and symmetric Skin: Skin color, texture, turgor normal. No rashes or lesions Neurologic: Grossly normal Psych: Anxious, upset about daughter being in jail  EKG: Normal sinus  rhythm at 89  ASSESSMENT: 1. Severe COPD with severe pulmonary hypertension 2. Mild aortic stenosis 3. Low risk nuclear stress testing in 2012 4. Dyslipidemia - not on statin due to recent hepatitis  PLAN: 1.   Ms. Remmers is recovering from her recent episode of altered mental status and possible liver failure which may be related to overuse of Tylenol and Tylenol containing products. She reported taking significant number of Fioricet for headaches which she has on a daily basis.  She continues to use her oxygen but prefers to use it while laying at rest and not when up and exerting herself, I explained to her that this is the opposite of how it should be used. In fact at this point she probably should use a 24/7. Official discuss this more with her upcoming visit to see Dr. Kendrick Fries. For the time being, I would recommend that she stay off of her cholesterol medication. Hopefully she can get some potential medications for her headaches but does contain Tylenol. Her current dose of Lasix is appropriate and I would recommend she stay some aspirin for prevention of coronary disease. Her recent lipid profile showed total cholesterol 223, triglycerides 147, HDL 60 and LDL 134, which is reasonable for primary prevention. Plan to see her back in 6 months or sooner as necessary.  Chrystie Nose, MD, [facility] Attending Cardiologist [facility]  Zianna Dercole C 08/10/2013, 11:05 AM

## 2013-09-06 LAB — POCT I-STAT 3, VENOUS BLOOD GAS (G3P V)
Acid-Base Excess: 1 mmol/L (ref 0.0–2.0)
Bicarbonate: 29.1 mEq/L — ABNORMAL HIGH (ref 20.0–24.0)
O2 Saturation: 70 %
TCO2: 31 mmol/L (ref 0–100)
pCO2, Ven: 59.3 mmHg — ABNORMAL HIGH (ref 45.0–50.0)
pH, Ven: 7.298 (ref 7.250–7.300)
pO2, Ven: 41 mmHg (ref 30.0–45.0)

## 2013-09-13 ENCOUNTER — Ambulatory Visit: Payer: Medicare Other | Admitting: Pulmonary Disease

## 2013-10-03 ENCOUNTER — Encounter: Payer: Self-pay | Admitting: Pulmonary Disease

## 2013-10-03 ENCOUNTER — Ambulatory Visit (INDEPENDENT_AMBULATORY_CARE_PROVIDER_SITE_OTHER): Payer: Medicare Other | Admitting: Pulmonary Disease

## 2013-10-03 VITALS — BP 116/76 | HR 84 | Temp 98.5°F | Ht 62.0 in | Wt 117.0 lb

## 2013-10-03 DIAGNOSIS — Z23 Encounter for immunization: Secondary | ICD-10-CM

## 2013-10-03 DIAGNOSIS — J449 Chronic obstructive pulmonary disease, unspecified: Secondary | ICD-10-CM

## 2013-10-03 DIAGNOSIS — J9610 Chronic respiratory failure, unspecified whether with hypoxia or hypercapnia: Secondary | ICD-10-CM

## 2013-10-03 NOTE — Patient Instructions (Signed)
QUIT SMOKING Keep using your oxygen regularly, 24 hours a day Use the symbicort twice a day We will see you back in 6 months or sooner if needed

## 2013-10-03 NOTE — Assessment & Plan Note (Signed)
Fortunately she does not have pulmonary hypertension, but she tells me that she is still not using oxygen consistently at home.  I advised her at length to use her oxygen regularly and explained that not using it could lead to pulmonary hypertension.

## 2013-10-03 NOTE — Assessment & Plan Note (Signed)
Unfortunately she has not PFTs yet, so I can't quantify the COPD.  However, she seems to be doing fairly well on the Symbicort and oxygen.  Plan: -I had to remind her again today to use the oxygen regularly -continue symbicort -go get PFTs -pneumovax today -f/u one year

## 2013-10-03 NOTE — Progress Notes (Signed)
Subjective:    Patient ID: [name], female    DOB: 1945-12-17, 68 y.o.   MRN: [id number]  Synopsis: Jacqualin has COPD and requires 3L [place] at rest and is typically non-compliant with her oxygen therapy. She has never had PFTs.  We were worried about pulmonary hypertension in 2014 so we performed a RHC which showed normal pulmonary pressures.    HPI  10/03/2013 ROV >> She tells me that she is still not using oxygen a lot during the day when she is at rest.  She has only been using it when she exerts herself.  She has gained a few pounds since the last visit.  She has not had too much dyspnea since the last visit.  She is frustrated because she has not been given Xanax from her primary care physician.  She has minimal cough.    Past Medical History  Diagnosis Date  . Angina   . Heart murmur   . Depressed   . Coronary artery disease 2006    2 stents w/previous MI  . Hypertension   . Migraine   . Anxiety   . Myocardial infarction 2006    NSTEMI  . COPD (chronic obstructive pulmonary disease)     oxygen-dependent 4LPM [place]  . Moderate to severe pulmonary hypertension   . Diastolic CHF   . Cancer of breast dx'd 2008    RT Breast  . Aortic stenosis     mild  . Dyslipidemia   . NSVT (nonsustained ventricular tachycardia)     h/o  . History of nuclear stress test 08/03/2011    attenuation at apex - no perfusion defects      Review of Systems     Objective:   Physical Exam Filed Vitals:   10/03/13 1529  BP: 116/76  Pulse: 84  Temp: 98.5 F (36.9 C)  TempSrc: Oral  Height: 5\' 2"  (1.575 m)  Weight: 117 lb (53.071 kg)  SpO2: 95%   3L [place]  Gen: no distress HEENT: NCAT, EOMi PULM: Few wheezes bilaterally CV: RRR, no mgr AB: bS+, soft, nontender Ext: warm, acyanotic, no edema      Assessment & Plan:   COPD (chronic obstructive pulmonary disease) Unfortunately she has not PFTs yet, so I can't quantify the COPD.  However, she seems to be doing fairly well on the  Symbicort and oxygen.  Plan: -I had to remind her again today to use the oxygen regularly -continue symbicort -go get PFTs -pneumovax today -f/u one year  Chronic respiratory failure Fortunately she does not have pulmonary hypertension, but she tells me that she is still not using oxygen consistently at home.  I advised her at length to use her oxygen regularly and explained that not using it could lead to pulmonary hypertension.    Updated Medication List Outpatient Encounter Prescriptions as of 10/03/2013  Medication Sig  . albuterol (PROVENTIL HFA;VENTOLIN HFA) 108 (90 BASE) MCG/ACT inhaler Inhale 2 puffs into the lungs every 4 (four) hours as needed for shortness of breath. Shortness of breath and wheezing  Patient is using Pro-Air brand  . albuterol (PROVENTIL) (5 MG/ML) 0.5% nebulizer solution Take 2.5 mg by nebulization every 2 (two) hours as needed for wheezing or shortness of breath.  . anastrozole (ARIMIDEX) 1 MG tablet Take 1 mg by mouth at bedtime.   Marland Kitchen aspirin 325 MG EC tablet Take 325 mg by mouth daily.  . bisacodyl (DULCOLAX) 5 MG EC tablet Take 10 mg by mouth 2 (  two) times daily as needed (for constipation).  . budesonide-formoterol (SYMBICORT) 160-4.5 MCG/ACT inhaler Inhale 2 puffs into the lungs 2 (two) times daily.  . Butalbital-APAP-Caffeine (FIORICET PO) Take 1 capsule by mouth daily as needed (for migraines).   . calcium-vitamin D (OSCAL WITH D) 250-125 MG-UNIT per tablet Take 1 tablet by mouth daily.  . diclofenac sodium (VOLTAREN) 1 % GEL Apply 1 application topically daily as needed (apply to hands). Hand pain  . furosemide (LASIX) 40 MG tablet Take 40 mg by mouth 2 (two) times daily.   Marland Kitchen ipratropium (ATROVENT HFA) 17 MCG/ACT inhaler Inhale 2 puffs into the lungs every 4 (four) hours as needed (for shortness of breath).  . lactulose (CHRONULAC) 10 GM/15ML solution Take 20 g by mouth 2 (two) times daily as needed (constipation).  . montelukast (SINGULAIR) 10 MG  tablet Take 10 mg by mouth at bedtime.   . Multiple Vitamins-Minerals (MULTIVITAMIN WITH MINERALS) tablet Take 1 tablet by mouth daily.  . nortriptyline (PAMELOR) 25 MG capsule Take 2 capsules (50 mg total) by mouth at bedtime.  . Omega 3 1200 MG CAPS Take 1,200 mg by mouth daily.   . ondansetron (ZOFRAN) 8 MG tablet Take 8 mg by mouth 2 (two) times daily as needed for nausea or vomiting.  . OXYGEN-HELIUM IN Inhale 3 L into the lungs as needed.  . pantoprazole (PROTONIX) 40 MG tablet Take 40 mg by mouth daily.  . potassium chloride SA (K-DUR,KLOR-CON) 20 MEQ tablet Take 20 mEq by mouth daily.  . Zoledronic Acid (ZOMETA IV) Inject 4 mg into the vein every 6 (six) months.   . ALPRAZolam (XANAX) 1 MG tablet Take 1 mg by mouth 3 (three) times daily as needed for sleep or anxiety.

## 2013-10-04 ENCOUNTER — Encounter: Payer: Self-pay | Admitting: Pulmonary Disease

## 2013-11-05 ENCOUNTER — Inpatient Hospital Stay (HOSPITAL_COMMUNITY)
Admission: EM | Admit: 2013-11-05 | Discharge: 2013-11-08 | DRG: 194 | Disposition: A | Discharge status: Home / Self Care | Payer: Medicare Other | Attending: Internal Medicine | Admitting: Internal Medicine

## 2013-11-05 ENCOUNTER — Encounter (HOSPITAL_COMMUNITY): Payer: Self-pay | Admitting: Emergency Medicine

## 2013-11-05 ENCOUNTER — Emergency Department (HOSPITAL_COMMUNITY): Payer: Medicare Other

## 2013-11-05 DIAGNOSIS — Z853 Personal history of malignant neoplasm of breast: Secondary | ICD-10-CM

## 2013-11-05 DIAGNOSIS — E872 Acidosis: Secondary | ICD-10-CM

## 2013-11-05 DIAGNOSIS — N179 Acute kidney failure, unspecified: Secondary | ICD-10-CM

## 2013-11-05 DIAGNOSIS — S32010A Wedge compression fracture of first lumbar vertebra, initial encounter for closed fracture: Secondary | ICD-10-CM

## 2013-11-05 DIAGNOSIS — Z9049 Acquired absence of other specified parts of digestive tract: Secondary | ICD-10-CM

## 2013-11-05 DIAGNOSIS — M7989 Other specified soft tissue disorders: Secondary | ICD-10-CM

## 2013-11-05 DIAGNOSIS — C50412 Malignant neoplasm of upper-outer quadrant of left female breast: Secondary | ICD-10-CM

## 2013-11-05 DIAGNOSIS — F1920 Other psychoactive substance dependence, uncomplicated: Secondary | ICD-10-CM

## 2013-11-05 DIAGNOSIS — I4729 Other ventricular tachycardia: Secondary | ICD-10-CM

## 2013-11-05 DIAGNOSIS — T391X1A Poisoning by 4-Aminophenol derivatives, accidental (unintentional), initial encounter: Secondary | ICD-10-CM

## 2013-11-05 DIAGNOSIS — I272 Pulmonary hypertension, unspecified: Secondary | ICD-10-CM

## 2013-11-05 DIAGNOSIS — K7200 Acute and subacute hepatic failure without coma: Secondary | ICD-10-CM

## 2013-11-05 DIAGNOSIS — I359 Nonrheumatic aortic valve disorder, unspecified: Secondary | ICD-10-CM | POA: Diagnosis present

## 2013-11-05 DIAGNOSIS — I2789 Other specified pulmonary heart diseases: Secondary | ICD-10-CM | POA: Diagnosis present

## 2013-11-05 DIAGNOSIS — Z8719 Personal history of other diseases of the digestive system: Secondary | ICD-10-CM

## 2013-11-05 DIAGNOSIS — R4182 Altered mental status, unspecified: Secondary | ICD-10-CM

## 2013-11-05 DIAGNOSIS — J449 Chronic obstructive pulmonary disease, unspecified: Secondary | ICD-10-CM

## 2013-11-05 DIAGNOSIS — Z720 Tobacco use: Secondary | ICD-10-CM

## 2013-11-05 DIAGNOSIS — I472 Ventricular tachycardia: Secondary | ICD-10-CM

## 2013-11-05 DIAGNOSIS — I10 Essential (primary) hypertension: Secondary | ICD-10-CM

## 2013-11-05 DIAGNOSIS — J9600 Acute respiratory failure, unspecified whether with hypoxia or hypercapnia: Secondary | ICD-10-CM

## 2013-11-05 DIAGNOSIS — M8580 Other specified disorders of bone density and structure, unspecified site: Secondary | ICD-10-CM

## 2013-11-05 DIAGNOSIS — R7989 Other specified abnormal findings of blood chemistry: Secondary | ICD-10-CM

## 2013-11-05 DIAGNOSIS — C50919 Malignant neoplasm of unspecified site of unspecified female breast: Secondary | ICD-10-CM | POA: Diagnosis present

## 2013-11-05 DIAGNOSIS — I350 Nonrheumatic aortic (valve) stenosis: Secondary | ICD-10-CM

## 2013-11-05 DIAGNOSIS — I25810 Atherosclerosis of coronary artery bypass graft(s) without angina pectoris: Secondary | ICD-10-CM

## 2013-11-05 DIAGNOSIS — F17200 Nicotine dependence, unspecified, uncomplicated: Secondary | ICD-10-CM | POA: Diagnosis present

## 2013-11-05 DIAGNOSIS — E871 Hypo-osmolality and hyponatremia: Secondary | ICD-10-CM

## 2013-11-05 DIAGNOSIS — E875 Hyperkalemia: Secondary | ICD-10-CM

## 2013-11-05 DIAGNOSIS — R945 Abnormal results of liver function studies: Secondary | ICD-10-CM

## 2013-11-05 DIAGNOSIS — I509 Heart failure, unspecified: Secondary | ICD-10-CM

## 2013-11-05 DIAGNOSIS — R0602 Shortness of breath: Secondary | ICD-10-CM

## 2013-11-05 DIAGNOSIS — J9610 Chronic respiratory failure, unspecified whether with hypoxia or hypercapnia: Secondary | ICD-10-CM

## 2013-11-05 DIAGNOSIS — I252 Old myocardial infarction: Secondary | ICD-10-CM

## 2013-11-05 DIAGNOSIS — J441 Chronic obstructive pulmonary disease with (acute) exacerbation: Secondary | ICD-10-CM

## 2013-11-05 DIAGNOSIS — Z9981 Dependence on supplemental oxygen: Secondary | ICD-10-CM

## 2013-11-05 DIAGNOSIS — B9789 Other viral agents as the cause of diseases classified elsewhere: Secondary | ICD-10-CM | POA: Diagnosis present

## 2013-11-05 DIAGNOSIS — E8729 Other acidosis: Secondary | ICD-10-CM

## 2013-11-05 DIAGNOSIS — R0689 Other abnormalities of breathing: Secondary | ICD-10-CM

## 2013-11-05 DIAGNOSIS — Z7982 Long term (current) use of aspirin: Secondary | ICD-10-CM

## 2013-11-05 DIAGNOSIS — Z9861 Coronary angioplasty status: Secondary | ICD-10-CM

## 2013-11-05 DIAGNOSIS — E860 Dehydration: Secondary | ICD-10-CM

## 2013-11-05 DIAGNOSIS — I2510 Atherosclerotic heart disease of native coronary artery without angina pectoris: Secondary | ICD-10-CM

## 2013-11-05 DIAGNOSIS — J189 Pneumonia, unspecified organism: Principal | ICD-10-CM

## 2013-11-05 DIAGNOSIS — G43709 Chronic migraine without aura, not intractable, without status migrainosus: Secondary | ICD-10-CM

## 2013-11-05 DIAGNOSIS — Z881 Allergy status to other antibiotic agents status: Secondary | ICD-10-CM

## 2013-11-05 DIAGNOSIS — Z833 Family history of diabetes mellitus: Secondary | ICD-10-CM

## 2013-11-05 DIAGNOSIS — I5032 Chronic diastolic (congestive) heart failure: Secondary | ICD-10-CM

## 2013-11-05 DIAGNOSIS — E785 Hyperlipidemia, unspecified: Secondary | ICD-10-CM

## 2013-11-05 LAB — POCT I-STAT TROPONIN I: Troponin i, poc: 0 ng/mL (ref 0.00–0.08)

## 2013-11-05 LAB — BASIC METABOLIC PANEL
BUN: 15 mg/dL (ref 6–23)
CO2: 30 mEq/L (ref 19–32)
Calcium: 9.8 mg/dL (ref 8.4–10.5)
Chloride: 96 mEq/L (ref 96–112)
Creatinine, Ser: 0.86 mg/dL (ref 0.50–1.10)
GFR calc Af Amer: 79 mL/min — ABNORMAL LOW (ref >90)
GFR calc non Af Amer: 68 mL/min — ABNORMAL LOW (ref >90)
Glucose, Bld: 115 mg/dL — ABNORMAL HIGH (ref 70–99)
Potassium: 3.9 mEq/L (ref 3.7–5.3)
Sodium: 141 mEq/L (ref 137–147)

## 2013-11-05 LAB — CBC
HCT: 41.5 % (ref 36.0–46.0)
Hemoglobin: 14 g/dL (ref 12.0–15.0)
MCH: 32.9 pg (ref 26.0–34.0)
MCHC: 33.7 g/dL (ref 30.0–36.0)
MCV: 97.6 fL (ref 78.0–100.0)
Platelets: 199 10*3/uL (ref 150–400)
RBC: 4.25 MIL/uL (ref 3.87–5.11)
RDW: 14 % (ref 11.5–15.5)
WBC: 23.9 10*3/uL — ABNORMAL HIGH (ref 4.0–10.5)

## 2013-11-05 NOTE — ED Notes (Signed)
I-stat lactic acid will have to be re-drawn.

## 2013-11-05 NOTE — ED Notes (Signed)
Pt EMS, pt was satting 74% on room air and was very anxious.  Pt was given a total of 7.5 albuteral and 0.5 arovent in route.  Pt states she normall is 84% without O2, and 94 on 3lpm via [place].

## 2013-11-06 DIAGNOSIS — J449 Chronic obstructive pulmonary disease, unspecified: Secondary | ICD-10-CM

## 2013-11-06 DIAGNOSIS — J189 Pneumonia, unspecified organism: Secondary | ICD-10-CM | POA: Insufficient documentation

## 2013-11-06 DIAGNOSIS — E860 Dehydration: Secondary | ICD-10-CM

## 2013-11-06 DIAGNOSIS — I10 Essential (primary) hypertension: Secondary | ICD-10-CM | POA: Diagnosis present

## 2013-11-06 DIAGNOSIS — I2510 Atherosclerotic heart disease of native coronary artery without angina pectoris: Secondary | ICD-10-CM

## 2013-11-06 DIAGNOSIS — I25810 Atherosclerosis of coronary artery bypass graft(s) without angina pectoris: Secondary | ICD-10-CM

## 2013-11-06 HISTORY — DX: Atherosclerosis of coronary artery bypass graft(s) without angina pectoris: I25.810

## 2013-11-06 HISTORY — DX: Essential (primary) hypertension: I10

## 2013-11-06 LAB — CBC
HCT: 36.9 % (ref 36.0–46.0)
Hemoglobin: 12.1 g/dL (ref 12.0–15.0)
MCH: 32 pg (ref 26.0–34.0)
MCHC: 32.8 g/dL (ref 30.0–36.0)
MCV: 97.6 fL (ref 78.0–100.0)
Platelets: 246 10*3/uL (ref 150–400)
RBC: 3.78 MIL/uL — ABNORMAL LOW (ref 3.87–5.11)
RDW: 14 % (ref 11.5–15.5)
WBC: 27.9 10*3/uL — ABNORMAL HIGH (ref 4.0–10.5)

## 2013-11-06 LAB — LEGIONELLA ANTIGEN, URINE: Legionella Antigen, Urine: NEGATIVE

## 2013-11-06 LAB — EXPECTORATED SPUTUM ASSESSMENT W GRAM STAIN, RFLX TO RESP C

## 2013-11-06 LAB — CREATININE, SERUM
Creatinine, Ser: 0.89 mg/dL (ref 0.50–1.10)
GFR calc Af Amer: 76 mL/min — ABNORMAL LOW (ref >90)
GFR calc non Af Amer: 66 mL/min — ABNORMAL LOW (ref >90)

## 2013-11-06 LAB — CG4 I-STAT (LACTIC ACID): Lactic Acid, Venous: 1.34 mmol/L (ref 0.5–2.2)

## 2013-11-06 LAB — STREP PNEUMONIAE URINARY ANTIGEN: Strep Pneumo Urinary Antigen: NEGATIVE

## 2013-11-06 MED ORDER — VANCOMYCIN HCL IN DEXTROSE 1-5 GM/200ML-% IV SOLN
1000.0000 mg | Freq: Once | INTRAVENOUS | Status: AC
  Administered 2013-11-06: 1000 mg via INTRAVENOUS
  Filled 2013-11-06: qty 200

## 2013-11-06 MED ORDER — LEVOFLOXACIN IN D5W 750 MG/150ML IV SOLN
750.0000 mg | INTRAVENOUS | Status: DC
  Administered 2013-11-06: 750 mg via INTRAVENOUS
  Filled 2013-11-06: qty 150

## 2013-11-06 MED ORDER — HYDROCODONE-ACETAMINOPHEN 5-325 MG PO TABS
1.0000 | ORAL_TABLET | ORAL | Status: DC | PRN
  Administered 2013-11-06: 1 via ORAL
  Administered 2013-11-06 – 2013-11-07 (×2): 2 via ORAL
  Filled 2013-11-06 (×3): qty 2

## 2013-11-06 MED ORDER — ALPRAZOLAM 0.5 MG PO TABS: 1.0000 mg | ORAL_TABLET | Freq: Three times a day (TID) | ORAL | Status: DC | PRN

## 2013-11-06 MED ORDER — VANCOMYCIN HCL 500 MG IV SOLR
500.0000 mg | Freq: Two times a day (BID) | INTRAVENOUS | Status: DC
  Administered 2013-11-06 – 2013-11-07 (×3): 500 mg via INTRAVENOUS
  Filled 2013-11-06 (×4): qty 500

## 2013-11-06 MED ORDER — ALBUTEROL SULFATE (2.5 MG/3ML) 0.083% IN NEBU
INHALATION_SOLUTION | RESPIRATORY_TRACT | Status: AC
  Filled 2013-11-06: qty 3

## 2013-11-06 MED ORDER — LACTULOSE 10 GM/15ML PO SOLN
20.0000 g | Freq: Two times a day (BID) | ORAL | Status: DC | PRN
  Administered 2013-11-06 – 2013-11-08 (×5): 20 g via ORAL
  Filled 2013-11-06 (×4): qty 30

## 2013-11-06 MED ORDER — ACETAMINOPHEN 325 MG PO TABS
650.0000 mg | ORAL_TABLET | Freq: Once | ORAL | Status: AC
  Administered 2013-11-06: 650 mg via ORAL
  Filled 2013-11-06: qty 2

## 2013-11-06 MED ORDER — FENTANYL CITRATE 0.05 MG/ML IJ SOLN
50.0000 ug | INTRAMUSCULAR | Status: DC | PRN
  Administered 2013-11-06: 50 ug via INTRAVENOUS
  Filled 2013-11-06: qty 2

## 2013-11-06 MED ORDER — ASPIRIN EC 325 MG PO TBEC
325.0000 mg | DELAYED_RELEASE_TABLET | Freq: Every day | ORAL | Status: DC
  Administered 2013-11-06 – 2013-11-08 (×3): 325 mg via ORAL
  Filled 2013-11-06 (×3): qty 1

## 2013-11-06 MED ORDER — OMEGA-3-ACID ETHYL ESTERS 1 G PO CAPS
1.0000 g | ORAL_CAPSULE | Freq: Every day | ORAL | Status: DC
  Administered 2013-11-06 – 2013-11-08 (×3): 1 g via ORAL
  Filled 2013-11-06 (×3): qty 1

## 2013-11-06 MED ORDER — ALBUTEROL SULFATE (2.5 MG/3ML) 0.083% IN NEBU: 2.5000 mg | INHALATION_SOLUTION | Freq: Four times a day (QID) | RESPIRATORY_TRACT | Status: DC

## 2013-11-06 MED ORDER — NORTRIPTYLINE HCL 25 MG PO CAPS
50.0000 mg | ORAL_CAPSULE | Freq: Every day | ORAL | Status: DC
  Administered 2013-11-06 – 2013-11-07 (×2): 50 mg via ORAL
  Filled 2013-11-06 (×4): qty 2

## 2013-11-06 MED ORDER — LEVOFLOXACIN IN D5W 750 MG/150ML IV SOLN
750.0000 mg | INTRAVENOUS | Status: DC
  Administered 2013-11-06 – 2013-11-07 (×2): 750 mg via INTRAVENOUS
  Filled 2013-11-06 (×3): qty 150

## 2013-11-06 MED ORDER — ACETAMINOPHEN 650 MG RE SUPP: 650.0000 mg | Freq: Four times a day (QID) | RECTAL | Status: DC | PRN

## 2013-11-06 MED ORDER — FUROSEMIDE 10 MG/ML IJ SOLN
40.0000 mg | Freq: Once | INTRAMUSCULAR | Status: AC
  Administered 2013-11-06: 40 mg via INTRAVENOUS
  Filled 2013-11-06: qty 4

## 2013-11-06 MED ORDER — MONTELUKAST SODIUM 10 MG PO TABS
10.0000 mg | ORAL_TABLET | Freq: Every day | ORAL | Status: DC
  Administered 2013-11-06 – 2013-11-07 (×2): 10 mg via ORAL
  Filled 2013-11-06 (×3): qty 1

## 2013-11-06 MED ORDER — ENOXAPARIN SODIUM 30 MG/0.3ML ~~LOC~~ SOLN
30.0000 mg | SUBCUTANEOUS | Status: DC
  Administered 2013-11-06 – 2013-11-08 (×3): 30 mg via SUBCUTANEOUS
  Filled 2013-11-06 (×3): qty 0.3

## 2013-11-06 MED ORDER — PANTOPRAZOLE SODIUM 40 MG PO TBEC
40.0000 mg | DELAYED_RELEASE_TABLET | Freq: Every day | ORAL | Status: DC
  Administered 2013-11-06 – 2013-11-07 (×2): 40 mg via ORAL
  Filled 2013-11-06 (×3): qty 1

## 2013-11-06 MED ORDER — POTASSIUM CHLORIDE CRYS ER 20 MEQ PO TBCR
20.0000 meq | EXTENDED_RELEASE_TABLET | Freq: Every day | ORAL | Status: DC
  Administered 2013-11-06 – 2013-11-08 (×3): 20 meq via ORAL
  Filled 2013-11-06 (×3): qty 1

## 2013-11-06 MED ORDER — ONDANSETRON HCL 4 MG PO TABS: 4.0000 mg | ORAL_TABLET | Freq: Four times a day (QID) | ORAL | Status: DC | PRN

## 2013-11-06 MED ORDER — SODIUM CHLORIDE 0.9 % IV SOLN: 250.0000 mL | INTRAVENOUS | Status: DC | PRN

## 2013-11-06 MED ORDER — BIOTENE DRY MOUTH MT LIQD
15.0000 mL | Freq: Two times a day (BID) | OROMUCOSAL | Status: DC
  Administered 2013-11-06 – 2013-11-08 (×5): 15 mL via OROMUCOSAL

## 2013-11-06 MED ORDER — ADULT MULTIVITAMIN W/MINERALS CH
1.0000 | ORAL_TABLET | Freq: Every day | ORAL | Status: DC
  Administered 2013-11-07: 1 via ORAL
  Filled 2013-11-06: qty 1

## 2013-11-06 MED ORDER — ONDANSETRON HCL 4 MG/2ML IJ SOLN
4.0000 mg | Freq: Four times a day (QID) | INTRAMUSCULAR | Status: DC | PRN
  Administered 2013-11-06 – 2013-11-07 (×2): 4 mg via INTRAVENOUS
  Filled 2013-11-06 (×2): qty 2

## 2013-11-06 MED ORDER — IPRATROPIUM BROMIDE 0.02 % IN SOLN: 0.5000 mg | Freq: Four times a day (QID) | RESPIRATORY_TRACT | Status: DC

## 2013-11-06 MED ORDER — IPRATROPIUM-ALBUTEROL 0.5-2.5 (3) MG/3ML IN SOLN
3.0000 mL | Freq: Four times a day (QID) | RESPIRATORY_TRACT | Status: DC
  Administered 2013-11-06 – 2013-11-07 (×8): 3 mL via RESPIRATORY_TRACT
  Filled 2013-11-06 (×9): qty 3

## 2013-11-06 MED ORDER — ZOLPIDEM TARTRATE 5 MG PO TABS: 5.0000 mg | ORAL_TABLET | Freq: Every evening | ORAL | Status: DC | PRN

## 2013-11-06 MED ORDER — MORPHINE SULFATE 2 MG/ML IJ SOLN: 1.0000 mg | INTRAMUSCULAR | Status: DC | PRN

## 2013-11-06 MED ORDER — OMEGA 3 1200 MG PO CAPS: 1200.0000 mg | ORAL_CAPSULE | Freq: Every day | ORAL | Status: DC

## 2013-11-06 MED ORDER — SODIUM CHLORIDE 0.9 % IJ SOLN: 3.0000 mL | INTRAMUSCULAR | Status: DC | PRN

## 2013-11-06 MED ORDER — METHYLPREDNISOLONE SODIUM SUCC 125 MG IJ SOLR
125.0000 mg | Freq: Once | INTRAMUSCULAR | Status: AC
Start: 2013-11-06 — End: 2013-11-06
  Administered 2013-11-06: 125 mg via INTRAVENOUS
  Filled 2013-11-06: qty 2

## 2013-11-06 MED ORDER — FUROSEMIDE 40 MG PO TABS
40.0000 mg | ORAL_TABLET | Freq: Two times a day (BID) | ORAL | Status: DC
  Administered 2013-11-06 – 2013-11-08 (×5): 40 mg via ORAL
  Filled 2013-11-06 (×7): qty 1

## 2013-11-06 MED ORDER — CALCIUM CARBONATE-VITAMIN D 250-125 MG-UNIT PO TABS: 1.0000 | ORAL_TABLET | Freq: Every day | ORAL | Status: DC

## 2013-11-06 MED ORDER — METHYLPREDNISOLONE SODIUM SUCC 125 MG IJ SOLR
80.0000 mg | Freq: Four times a day (QID) | INTRAMUSCULAR | Status: DC
  Administered 2013-11-06 – 2013-11-07 (×6): 80 mg via INTRAVENOUS
  Filled 2013-11-06 (×2): qty 1.28
  Filled 2013-11-06: qty 2
  Filled 2013-11-06 (×8): qty 1.28

## 2013-11-06 MED ORDER — ALBUTEROL SULFATE (2.5 MG/3ML) 0.083% IN NEBU
5.0000 mg | INHALATION_SOLUTION | Freq: Once | RESPIRATORY_TRACT | Status: AC
  Administered 2013-11-06: 5 mg via RESPIRATORY_TRACT
  Filled 2013-11-06: qty 6

## 2013-11-06 MED ORDER — ONDANSETRON HCL 4 MG/2ML IJ SOLN
4.0000 mg | Freq: Once | INTRAMUSCULAR | Status: AC
  Administered 2013-11-06: 4 mg via INTRAVENOUS
  Filled 2013-11-06: qty 2

## 2013-11-06 MED ORDER — BISACODYL 5 MG PO TBEC
10.0000 mg | DELAYED_RELEASE_TABLET | Freq: Two times a day (BID) | ORAL | Status: DC | PRN
  Administered 2013-11-06 – 2013-11-07 (×4): 10 mg via ORAL
  Filled 2013-11-06 (×4): qty 2

## 2013-11-06 MED ORDER — MULTI-VITAMIN/MINERALS PO TABS
1.0000 | ORAL_TABLET | Freq: Every day | ORAL | Status: DC
  Administered 2013-11-06: 1 via ORAL
  Filled 2013-11-06 (×3): qty 1

## 2013-11-06 MED ORDER — ALBUTEROL SULFATE (2.5 MG/3ML) 0.083% IN NEBU: 2.5000 mg | INHALATION_SOLUTION | RESPIRATORY_TRACT | Status: DC | PRN

## 2013-11-06 MED ORDER — SODIUM CHLORIDE 0.9 % IJ SOLN: 3.0000 mL | Freq: Two times a day (BID) | INTRAMUSCULAR | Status: DC

## 2013-11-06 MED ORDER — ACETAMINOPHEN 325 MG PO TABS: 650.0000 mg | ORAL_TABLET | Freq: Four times a day (QID) | ORAL | Status: DC | PRN

## 2013-11-06 MED ORDER — BUDESONIDE-FORMOTEROL FUMARATE 160-4.5 MCG/ACT IN AERO
2.0000 | INHALATION_SPRAY | Freq: Two times a day (BID) | RESPIRATORY_TRACT | Status: DC
  Administered 2013-11-06 – 2013-11-08 (×5): 2 via RESPIRATORY_TRACT
  Filled 2013-11-06: qty 6

## 2013-11-06 MED ORDER — ANASTROZOLE 1 MG PO TABS
1.0000 mg | ORAL_TABLET | Freq: Every day | ORAL | Status: DC
  Administered 2013-11-06 – 2013-11-07 (×2): 1 mg via ORAL
  Filled 2013-11-06 (×3): qty 1

## 2013-11-06 MED ORDER — CALCIUM CARBONATE-VITAMIN D 500-200 MG-UNIT PO TABS
1.0000 | ORAL_TABLET | Freq: Every day | ORAL | Status: DC
  Administered 2013-11-06 – 2013-11-08 (×3): 1 via ORAL
  Filled 2013-11-06 (×4): qty 1

## 2013-11-06 NOTE — Progress Notes (Signed)
Pt arrive to unit in no s/s of distress. VS stable. Report received from ED nurse, Eme, over the phone prior to pt's arrival. Pt oriented to room. Whiteboard updated. Callbell within reach. Will continue to monitor.

## 2013-11-06 NOTE — Progress Notes (Signed)
Utilization review completed.  

## 2013-11-06 NOTE — ED Notes (Signed)
Admitting at bedside 

## 2013-11-06 NOTE — Progress Notes (Signed)
Patient Saturations on Room Air at Rest = 81%  Patient Saturations on 4L 02 at Rest = 90%  Patient Saturations on Room Air while Ambulating = 80%  Patient Saturations on 4L Liters of oxygen while Ambulating = 89%

## 2013-11-06 NOTE — H&P (Signed)
[facility]                                                                                    Patient Demographics  [name], is a 68 y.o. female  CSN: [id number]  MRN: [id number]  DOB - 07-14-1946  Admit Date - 11/05/2013  Outpatient Primary MD for the patient is Nena Jordan, IBTEHAL, MD   With History of -  Past Medical History  Diagnosis Date  . Angina   . Heart murmur   . Depressed   . Coronary artery disease 2006    2 stents w/previous MI  . Hypertension   . Migraine   . Anxiety   . Myocardial infarction 2006    NSTEMI  . COPD (chronic obstructive pulmonary disease)     oxygen-dependent 4LPM [place]  . Moderate to severe pulmonary hypertension   . Diastolic CHF   . Cancer of breast dx'd 2008    RT Breast  . Aortic stenosis     mild  . Dyslipidemia   . NSVT (nonsustained ventricular tachycardia)     h/o  . History of nuclear stress test 08/03/2011    attenuation at apex - no perfusion defects       Past Surgical History  Procedure Laterality Date  . Breast lumpectomy Right 2008  . Cardiac catheterization  2006  . Cholecystectomy  2011  . Abdominal surgery  2009  . Transthoracic echocardiogram  11/12/2011    EF 18-84%, normal systolic function, grade 1 diastolic dysfunction; ventricular septal flattening (D-sign); mild AS; trace-mild MR; LA mildly dilated; RV mod dilated; RA mod dilated; severe pulm HTN; elevated CVP    in for   Chief Complaint  Patient presents with  . Shortness of Breath     HPI  [name]  is a 68 y.o. female, with past medical history significant for COPD, coronary artery disease status post MI in the past, history of congestive heart failure, presenting today with one-day history of shortness of breath cough yellowish sputum and fever of 102. No nausea or vomiting. The patient has been in the hospital about 2 months ago and had a cardiac catheterization by Dr. Zoila Shutter . In the emergency room the patient  was noted to have left mid basal pneumonia and I was called to admit for COPD exacerbation and HCAPS . She reports that her ribs are hurting bilaterally due to the cough.    Review of Systems    In addition to the HPI above,  No Headache, No changes with Vision or hearing, No problems swallowing food or Liquids, No Chest pain, Cough or Shortness of Breath, No Abdominal pain, No Nausea or Vommitting, Bowel movements are regular, No Blood in stool or Urine, No dysuria, No new skin rashes or bruises, No new joints pains-aches,  No new weakness, tingling, numbness in any extremity, No recent weight gain or loss, No polyuria, polydypsia or polyphagia, No significant Mental Stressors.  A full 10 point Review of Systems was done, except as stated above, all other Review of Systems were negative.   Social History History  Substance Use Topics  . Smoking status: Current Every Day  Smoker -- 0.25 packs/day for 47 years    Types: Cigarettes  . Smokeless tobacco: Never Used     Comment: "once in a blue moon"  . Alcohol Use: Yes     Comment: wine with dinner occasionally     Family History Family History  Problem Relation Age of Onset  . Schizophrenia Sister   . Alzheimer's disease Mother   . Hyperlipidemia Brother   . Heart disease Sister   . Diabetes Sister      Prior to Admission medications   Medication Sig Start Date End Date Taking? Authorizing Provider  albuterol (PROVENTIL HFA;VENTOLIN HFA) 108 (90 BASE) MCG/ACT inhaler Inhale 2 puffs into the lungs every 4 (four) hours as needed for shortness of breath. Shortness of breath and wheezing  Patient is using Pro-Air brand   Yes Historical Provider, MD  albuterol (PROVENTIL) (5 MG/ML) 0.5% nebulizer solution Take 2.5 mg by nebulization every 2 (two) hours as needed for wheezing or shortness of breath. 06/25/13  Yes Drema Dallas, MD  ALPRAZolam Prudy Feeler) 1 MG tablet Take 1 mg by mouth 3 (three) times daily as needed for sleep or  anxiety.   Yes Historical Provider, MD  anastrozole (ARIMIDEX) 1 MG tablet Take 1 mg by mouth at bedtime.    Yes Historical Provider, MD  aspirin 325 MG EC tablet Take 325 mg by mouth daily.   Yes Historical Provider, MD  bisacodyl (DULCOLAX) 5 MG EC tablet Take 10 mg by mouth 2 (two) times daily as needed (for constipation).   Yes Historical Provider, MD  budesonide-formoterol (SYMBICORT) 160-4.5 MCG/ACT inhaler Inhale 2 puffs into the lungs 2 (two) times daily. 08/14/13  Yes Lupita Leash, MD  calcium-vitamin D (OSCAL WITH D) 250-125 MG-UNIT per tablet Take 1 tablet by mouth daily.   Yes Historical Provider, MD  diclofenac sodium (VOLTAREN) 1 % GEL Apply 1 application topically daily as needed (apply to hands). Hand pain   Yes Historical Provider, MD  furosemide (LASIX) 40 MG tablet Take 40 mg by mouth 2 (two) times daily.    Yes Historical Provider, MD  ipratropium (ATROVENT HFA) 17 MCG/ACT inhaler Inhale 2 puffs into the lungs every 4 (four) hours as needed (for shortness of breath). 06/25/13  Yes Drema Dallas, MD  lactulose [facility]) 10 GM/15ML solution Take 20 g by mouth 2 (two) times daily as needed (constipation).   Yes Historical Provider, MD  montelukast (SINGULAIR) 10 MG tablet Take 10 mg by mouth at bedtime.    Yes Historical Provider, MD  Multiple Vitamins-Minerals (MULTIVITAMIN WITH MINERALS) tablet Take 1 tablet by mouth daily.   Yes Historical Provider, MD  nortriptyline (PAMELOR) 25 MG capsule Take 2 capsules (50 mg total) by mouth at bedtime. 08/31/13  Yes Adam Gus Rankin, DO  Omega 3 1200 MG CAPS Take 1,200 mg by mouth daily.    Yes Historical Provider, MD  ondansetron (ZOFRAN) 8 MG tablet Take 8 mg by mouth 2 (two) times daily as needed for nausea or vomiting.   Yes Historical Provider, MD  OXYGEN-HELIUM IN Inhale 3 L into the lungs as needed.   Yes Historical Provider, MD  pantoprazole (PROTONIX) 40 MG tablet Take 40 mg by mouth daily.   Yes Historical Provider, MD   potassium chloride SA (K-DUR,KLOR-CON) 20 MEQ tablet Take 20 mEq by mouth daily.   Yes Historical Provider, MD  Zoledronic Acid (ZOMETA IV) Inject 4 mg into the vein every 6 (six) months.    Yes Historical Provider,  MD  Butalbital-APAP-Caffeine (FIORICET PO) Take 1 capsule by mouth daily as needed (for migraines).     Historical Provider, MD    Allergies  Allergen Reactions  . Ceftriaxone Other (See Comments)    "Blow up like a balloon"    Physical Exam  Vitals  Blood pressure 103/47, pulse 97, temperature 99.1 F (37.3 C), temperature source Oral, resp. rate 26, weight 52.663 kg (116 lb 1.6 oz), SpO2 94.00%.   1. General elderly white female lying in bed very pleasant, looks younger than age.  2. Normal affect and insight, Not Suicidal or Homicidal, Awake Alert, Oriented X 3.  3. No F.N deficits, ALL C.Nerves Intact, Strength 5/5 all 4 extremities, Sensation intact all 4 extremities, Plantars down going.  4. Ears and Eyes appear Normal, Conjunctivae clear, PERRLA. Moist Oral Mucosa.  5. Supple Neck, No JVD, No cervical lymphadenopathy appriciated, No Carotid Bruits.  6. Symmetrical Chest wall movement, decreased breath sounds with scattered rhonchi and wheezing bilaterally.  7. RRR, No Gallops, Rubs or Murmurs, No Parasternal Heave.  8. Positive Bowel Sounds, Abdomen Soft, Non tender, No organomegaly appriciated,No rebound -guarding or rigidity.  9.  No Cyanosis, Normal Skin Turgor, No Skin Rash or Bruise.  10. Good muscle tone,  joints appear normal , no effusions, Normal ROM.  11. No Palpable Lymph Nodes in Neck or Axillae    Data Review  CBC  Recent Labs Lab 11/05/13 2230  WBC 23.9*  HGB 14.0  HCT 41.5  PLT 199  MCV 97.6  MCH 32.9  MCHC 33.7  RDW 14.0   ------------------------------------------------------------------------------------------------------------------  Chemistries   Recent Labs Lab 11/05/13 2230  NA 141  K 3.9  CL 96  CO2 30   GLUCOSE 115*  BUN 15  CREATININE 0.86  CALCIUM 9.8   ---------------------------------------------------------------------------------------------------------------  Urinalysis    Component Value Date/Time   COLORURINE YELLOW 06/20/2013 0059   APPEARANCEUR TURBID* 06/20/2013 0059   LABSPEC >1.030* 06/20/2013 0059   PHURINE 5.0 06/20/2013 0059   GLUCOSEU 100* 06/20/2013 0059   HGBUR MODERATE* 06/20/2013 0059   BILIRUBINUR MODERATE* 06/20/2013 0059   KETONESUR 15* 06/20/2013 0059   PROTEINUR >300* 06/20/2013 0059   UROBILINOGEN 1.0 06/20/2013 0059   NITRITE POSITIVE* 06/20/2013 0059   LEUKOCYTESUR TRACE* 06/20/2013 0059    ----------------------------------------------------------------------------------------------------------------    Imaging results:   Dg Chest 2 View (if Patient Has Fever And/or Copd)  11/05/2013   CLINICAL DATA:  Shortness of breath  EXAM: CHEST  2 VIEW  COMPARISON:  June 21, 2013  FINDINGS: The heart size and mediastinal contours are stable. There is pulmonary edema. There is patchy consolidation of medial left mid to lung base. There is no pleural effusion. The visualized skeletal structures are stable.  IMPRESSION: Pulmonary edema. Patchy consolidation of medial left mid to lung base, superimposed pneumonia not excluded.   Electronically Signed   By: Sherian Rein M.D.   On: 11/05/2013 23:20    My personal review of EKG: Sinus tach with PVCs a 12-lead the left atrial enlargement and nonspecific ST changes    Assessment & Plan  1. COPD exacerbation 2. HCAPS 3.CHF 4. History of hypertension and aortic stenosis  Plan  Solu-Medrol Neb treatments IV antibiotics vanc and Levaquin Oxygen    DVT Prophylaxis Lovenox  AM Labs Ordered, also please review Full Orders  Family Communication: Admission, patients condition and plan of care including tests being ordered have been discussed with the patient  who indicates understanding and agrees with the plan  and Code Status.  Code Status full  Disposition Plan: Home  Time spent in minutes : 33 minutes  Condition GUARDED

## 2013-11-06 NOTE — ED Provider Notes (Signed)
CSN: [id number]     Arrival date & time 11/05/13  2213 History   First MD Initiated Contact with Patient 11/05/13 2333     Chief Complaint  Patient presents with  . Shortness of Breath     (Consider location/radiation/quality/duration/timing/severity/associated sxs/prior Treatment) HPI Hx per PT - h/o COPD and CAD, developed SOB, and fever to 103 today, now mild HA, in triage had pulse ox 85%, BIB EMS and had breathing treatment in route. No leg pain or swelling, no rash or recent travel, followed by Velora Heckler PCP. Symptoms MOD in severity. Some sharp CP only with coughing - no hemoptysis.   Past Medical History  Diagnosis Date  . Angina   . Heart murmur   . Depressed   . Coronary artery disease 2006    2 stents w/previous MI  . Hypertension   . Migraine   . Anxiety   . Myocardial infarction 2006    NSTEMI  . COPD (chronic obstructive pulmonary disease)     oxygen-dependent 4LPM [place]  . Moderate to severe pulmonary hypertension   . Diastolic CHF   . Cancer of breast dx'd 2008    RT Breast  . Aortic stenosis     mild  . Dyslipidemia   . NSVT (nonsustained ventricular tachycardia)     h/o  . History of nuclear stress test 08/03/2011    attenuation at apex - no perfusion defects    Past Surgical History  Procedure Laterality Date  . Breast lumpectomy Right 2008  . Cardiac catheterization  2006  . Cholecystectomy  2011  . Abdominal surgery  2009  . Transthoracic echocardiogram  11/12/2011    EF 99-35%, normal systolic function, grade 1 diastolic dysfunction; ventricular septal flattening (D-sign); mild AS; trace-mild MR; LA mildly dilated; RV mod dilated; RA mod dilated; severe pulm HTN; elevated CVP   Family History  Problem Relation Age of Onset  . Schizophrenia Sister   . Alzheimer's disease Mother   . Hyperlipidemia Brother   . Heart disease Sister   . Diabetes Sister    History  Substance Use Topics  . Smoking status: Current Every Day Smoker -- 0.25 packs/day for  47 years    Types: Cigarettes  . Smokeless tobacco: Never Used     Comment: "once in a blue moon"  . Alcohol Use: Yes     Comment: wine with dinner occasionally   OB History   Grav Para Term Preterm Abortions TAB SAB Ect Mult Living                 Review of Systems  Constitutional: Positive for fever and chills.  Eyes: Negative for pain.  Respiratory: Positive for cough, shortness of breath and wheezing.   Cardiovascular: Positive for chest pain.  Gastrointestinal: Negative for abdominal pain.  Genitourinary: Negative for flank pain.  Musculoskeletal: Negative for back pain, neck pain and neck stiffness.  Skin: Negative for rash.  Neurological: Positive for headaches.  All other systems reviewed and are negative.      Allergies  Ceftriaxone  Home Medications   Current Outpatient Rx  Name  Route  Sig  Dispense  Refill  . albuterol (PROVENTIL HFA;VENTOLIN HFA) 108 (90 BASE) MCG/ACT inhaler   Inhalation   Inhale 2 puffs into the lungs every 4 (four) hours as needed for shortness of breath. Shortness of breath and wheezing  Patient is using Pro-Air brand         . albuterol (PROVENTIL) (5 MG/ML) 0.5% nebulizer solution  Nebulization   Take 2.5 mg by nebulization every 2 (two) hours as needed for wheezing or shortness of breath.         . ALPRAZolam (XANAX) 1 MG tablet   Oral   Take 1 mg by mouth 3 (three) times daily as needed for sleep or anxiety.         Marland Kitchen anastrozole (ARIMIDEX) 1 MG tablet   Oral   Take 1 mg by mouth at bedtime.          Marland Kitchen aspirin 325 MG EC tablet   Oral   Take 325 mg by mouth daily.         . bisacodyl (DULCOLAX) 5 MG EC tablet   Oral   Take 10 mg by mouth 2 (two) times daily as needed (for constipation).         . budesonide-formoterol (SYMBICORT) 160-4.5 MCG/ACT inhaler   Inhalation   Inhale 2 puffs into the lungs 2 (two) times daily.         Marland Kitchen Butalbital-APAP-Caffeine (FIORICET PO)   Oral   Take 1 capsule by mouth  daily as needed (for migraines).          . calcium-vitamin D (OSCAL WITH D) 250-125 MG-UNIT per tablet   Oral   Take 1 tablet by mouth daily.         . diclofenac sodium (VOLTAREN) 1 % GEL   Topical   Apply 1 application topically daily as needed (apply to hands). Hand pain         . furosemide (LASIX) 40 MG tablet   Oral   Take 40 mg by mouth 2 (two) times daily.          Marland Kitchen ipratropium (ATROVENT HFA) 17 MCG/ACT inhaler   Inhalation   Inhale 2 puffs into the lungs every 4 (four) hours as needed (for shortness of breath).         . lactulose (CHRONULAC) 10 GM/15ML solution   Oral   Take 20 g by mouth 2 (two) times daily as needed (constipation).         . montelukast (SINGULAIR) 10 MG tablet   Oral   Take 10 mg by mouth at bedtime.          . Multiple Vitamins-Minerals (MULTIVITAMIN WITH MINERALS) tablet   Oral   Take 1 tablet by mouth daily.         . nortriptyline (PAMELOR) 25 MG capsule   Oral   Take 2 capsules (50 mg total) by mouth at bedtime.   60 capsule   3   . Omega 3 1200 MG CAPS   Oral   Take 1,200 mg by mouth daily.          . ondansetron (ZOFRAN) 8 MG tablet   Oral   Take 8 mg by mouth 2 (two) times daily as needed for nausea or vomiting.         . OXYGEN-HELIUM IN   Inhalation   Inhale 3 L into the lungs as needed.         . pantoprazole (PROTONIX) 40 MG tablet   Oral   Take 40 mg by mouth daily.         . potassium chloride SA (K-DUR,KLOR-CON) 20 MEQ tablet   Oral   Take 20 mEq by mouth daily.         . Zoledronic Acid (ZOMETA IV)   Intravenous   Inject 4 mg into the vein every 6 (six)  months.           BP 108/59  Pulse 104  Temp(Src) 99.1 F (37.3 C) (Oral)  Resp 25  Wt 116 lb 1.6 oz (52.663 kg)  SpO2 88% Physical Exam  Constitutional: She is oriented to person, place, and time. She appears well-developed and well-nourished.  HENT:  Head: Normocephalic and atraumatic.  Mouth/Throat: Oropharynx is clear  and moist.  Eyes: EOM are normal. Pupils are equal, round, and reactive to light.  Neck: Neck supple.  Cardiovascular: Regular rhythm and intact distal pulses.   tachycardia  Pulmonary/Chest:  Tachypnea, bilateral wheezes and rhonchi  Abdominal: Soft. There is no tenderness.  Musculoskeletal: Normal range of motion. She exhibits no edema and no tenderness.  Neurological: She is alert and oriented to person, place, and time.  Skin: Skin is warm and dry.    ED Course  Procedures (including critical care time) Labs Review Labs Reviewed  BASIC METABOLIC PANEL - Abnormal; Notable for the following:    Glucose, Bld 115 (*)    GFR calc non Af Amer 68 (*)    GFR calc Af Amer 79 (*)    All other components within normal limits  CBC - Abnormal; Notable for the following:    WBC 23.9 (*)    All other components within normal limits  POCT I-STAT TROPONIN I   Imaging Review Dg Chest 2 View (if Patient Has Fever And/or Copd)  11/05/2013   CLINICAL DATA:  Shortness of breath  EXAM: CHEST  2 VIEW  COMPARISON:  June 21, 2013  FINDINGS: The heart size and mediastinal contours are stable. There is pulmonary edema. There is patchy consolidation of medial left mid to lung base. There is no pleural effusion. The visualized skeletal structures are stable.  IMPRESSION: Pulmonary edema. Patchy consolidation of medial left mid to lung base, superimposed pneumonia not excluded.   Electronically Signed   By: Abelardo Diesel M.D.   On: 11/05/2013 23:20    EKG Interpretation    Date/Time:  Monday November 05 2013 22:42:08 EST Ventricular Rate:  109 PR Interval:  152 QRS Duration: 78 QT Interval:  314 QTC Calculation: 422 R Axis:   41 Text Interpretation:  Sinus tachycardia with occasional Premature ventricular complexes Possible Left atrial enlargement Nonspecific ST abnormality Borderline ECG Confirmed by Jaleia Hanke  MD, Rhet Rorke ([id number]) on 11/06/2013 12:03:19 AM           O2 provided, breathing  treatment, solumedrol, IV ABx to cover HCAP - noted ceftriaxone allergy  MED consulted for admit MDM   Dx: HCAP, COPD exacerbation, elevated WBC  ECG, CXR, labs IV ABx and MED admit  Teressa Lower, MD 11/06/13 [id number]

## 2013-11-06 NOTE — Progress Notes (Signed)
[facility] PROGRESS NOTE  [name] YHC:[id number] DOB: 10-19-1945 DOA: 11/05/2013 PCP: Cloyd Stagers, MD  HPI/Subjective: [name] is a 68 y.o. female, with PMH significant for COPD (on 3L at home when sleeping), CAD s/p MI in the past, history of CHF, Aortic Stenosis presenting today with one-day history of SOB, cough with yellowish sputum, and fever of 102. The patient had a cath with Dr. Zoila Shutter about 2 months ago. Currently the patient still has the cough and is not experiencing any SOB with 4L of O2.   Assessment/Plan: Principal Problem:   HCAP (healthcare-associated pneumonia) Active Problems:   COPD (chronic obstructive pulmonary disease)   Tobacco abuse   HTN (hypertension)   CAD (coronary artery disease) of artery bypass graft  Principal Problem:   COPD Exacerbation:  -Likely secondary to HCAP -CXR shows patchy consolidation of medial L lung base  -SOB, febrile, and productive cough on admission; s/p hospitalization for Cath 2 months ago -Started Levaquin and Vancomycin -WBC of 27.9 -Blood cultures pending  -Continue IV solumedrol and BDs and 4L of O2  -RN to ambulate pt with pulse ox  HTN:  -Per pt her BP normally runs low, asymptomatic -BP on 2/10 92/53 -Continue home regimen Lasix 40 mg BID  Breast CA:  -Continue Anastrozole   CAD:  -Continue ASA   Tobacco Use:  -Per patient hx, says she has cut down smoking in last few years  -Counseled pt on need for cessation    DVT Prophylaxis:  Lovanox Code Status: Full  Family Communication: No family at the bedside this morning  Disposition Plan: Remain inpatient; may d/c to home tomorrow if medically appropriate  Consultants:  None   Procedures:  None   Antibiotics:  None   Objective: Filed Vitals:   11/06/13 0213 11/06/13 0247 11/06/13 0539 11/06/13 0909  BP:   92/53   Pulse:   80   Temp:   98.2 F (36.8 C)   TempSrc:   Oral   Resp:   20   Height:      Weight:       SpO2: 92% 93% 93% 93%    Intake/Output Summary (Last 24 hours) at 11/06/13 1139 Last data filed at 11/06/13 5176  Gross per 24 hour  Intake      0 ml  Output    350 ml  Net   -350 ml   Filed Weights   11/05/13 2219 11/06/13 0202  Weight: 52.663 kg (116 lb 1.6 oz) 52.663 kg (116 lb 1.6 oz)   Exam: General: NAD, appears stated age  Cardiovascular: RRR, S1 S2 auscultated, with a minimal systolic murmur auscultated best in aortic area Respiratory: Crackles B/L at the bases with minimal end expiratory wheezes   Abdomen: Soft, nontender, nondistended, + bowel sounds  Extremities: warm dry without cyanosis clubbing or edema.  Neuro: AAOx3, Strength 5/5 in upper and lower extremities.  Skin: Without rashes exudates or nodules.   Psych: Normal affect and demeanor with intact judgement and insight  Data Reviewed: Basic Metabolic Panel:  Recent Labs Lab 11/05/13 2230 11/06/13 0534  NA 141  --   K 3.9  --   CL 96  --   CO2 30  --   GLUCOSE 115*  --   BUN 15  --   CREATININE 0.86 0.89  CALCIUM 9.8  --   CBC:  Recent Labs Lab 11/05/13 2230 11/06/13 0534  WBC 23.9* 27.9*  HGB 14.0 12.1  HCT 41.5 36.9  MCV 97.6 97.6  PLT 199 246  BNP (last 3 results)  Recent Labs  06/19/13 1900 06/21/13 0502 06/22/13 0400  PROBNP 5267.0* 3048.0* 1715.0*   Recent Results (from the past 240 hour(s))  CULTURE, EXPECTORATED SPUTUM-ASSESSMENT     Status: None   Collection Time    11/06/13  2:49 AM      Result Value Range Status   Specimen Description SPUTUM   Final   Special Requests NONE   Final   Sputum evaluation     Final   Value: THIS SPECIMEN IS ACCEPTABLE. RESPIRATORY CULTURE REPORT TO FOLLOW.   Report Status 11/06/2013 FINAL   Final    Studies: Dg Chest 2 View (if Patient Has Fever And/or Copd)  11/05/2013   CLINICAL DATA:  Shortness of breath  EXAM: CHEST  2 VIEW  COMPARISON:  June 21, 2013  FINDINGS: The heart size and mediastinal contours are stable. There is  pulmonary edema. There is patchy consolidation of medial left mid to lung base. There is no pleural effusion. The visualized skeletal structures are stable.  IMPRESSION: Pulmonary edema. Patchy consolidation of medial left mid to lung base, superimposed pneumonia not excluded.   Electronically Signed   By: Abelardo Diesel M.D.   On: 11/05/2013 23:20   Scheduled Meds: . albuterol      . anastrozole  1 mg Oral QHS  . antiseptic oral rinse  15 mL Mouth Rinse BID  . aspirin  325 mg Oral Daily  . budesonide-formoterol  2 puff Inhalation BID  . calcium-vitamin D  1 tablet Oral Q breakfast  . enoxaparin (LOVENOX) injection  30 mg Subcutaneous Q24H  . furosemide  40 mg Oral BID  . ipratropium-albuterol  3 mL Nebulization Q6H  . levofloxacin (LEVAQUIN) IV  750 mg Intravenous Q24H  . methylPREDNISolone (SOLU-MEDROL) injection  80 mg Intravenous Q6H  . montelukast  10 mg Oral QHS  . multivitamin with minerals  1 tablet Oral Daily  . nortriptyline  50 mg Oral QHS  . omega-3 acid ethyl esters  1 g Oral Daily  . pantoprazole  40 mg Oral Daily  . potassium chloride SA  20 mEq Oral Daily  . sodium chloride  3 mL Intravenous Q12H  . vancomycin  500 mg Intravenous Q12H   Principal Problem:   HCAP (healthcare-associated pneumonia) Active Problems:   COPD (chronic obstructive pulmonary disease)   Tobacco abuse   HTN (hypertension)   CAD (coronary artery disease) of artery bypass graft  Lang Snow, PA-S  [facility] Pager [phone]. If 7PM-7AM, please contact night-coverage at [email or web address], password [facility] 11/06/2013, 11:39 AM  LOS: 1 day     Addendum  Patient seen and examined, chart and data base reviewed.  I agree with the above assessment and plan.  For full details please see Mrs. Lang Snow, PA-S note.   Birdie Hopes, MD [facility] Pager: [phone] 11/06/2013, 11:51 AM

## 2013-11-06 NOTE — Progress Notes (Signed)
ANTIBIOTIC CONSULT NOTE - INITIAL  Pharmacy Consult for Vancomycin Indication: rule out pneumonia  Allergies  Allergen Reactions  . Ceftriaxone Other (See Comments)    "Blow up like a balloon"    Patient Measurements: Weight: 116 lb 1.6 oz (52.663 kg)  Vital Signs: Temp: 99.1 F (37.3 C) (02/09 2219) Temp src: Oral (02/09 2219) BP: 82/62 mmHg (02/10 0137) Pulse Rate: 95 (02/10 0137) Intake/Output from previous day:   Intake/Output from this shift:    Labs:  Recent Labs  11/05/13 2230  WBC 23.9*  HGB 14.0  PLT 199  CREATININE 0.86   The CrCl is unknown because both a height and weight (above a minimum accepted value) are required for this calculation. No results found for this basename: VANCOTROUGH, VANCOPEAK, VANCORANDOM, GENTTROUGH, GENTPEAK, GENTRANDOM, TOBRATROUGH, TOBRAPEAK, TOBRARND, AMIKACINPEAK, AMIKACINTROU, AMIKACIN,  in the last 72 hours   Microbiology: No results found for this or any previous visit (from the past 720 hour(s)).  Medical History: Past Medical History  Diagnosis Date  . Angina   . Heart murmur   . Depressed   . Coronary artery disease 2006    2 stents w/previous MI  . Hypertension   . Migraine   . Anxiety   . Myocardial infarction 2006    NSTEMI  . COPD (chronic obstructive pulmonary disease)     oxygen-dependent 4LPM [place]  . Moderate to severe pulmonary hypertension   . Diastolic CHF   . Cancer of breast dx'd 2008    RT Breast  . Aortic stenosis     mild  . Dyslipidemia   . NSVT (nonsustained ventricular tachycardia)     h/o  . History of nuclear stress test 08/03/2011    attenuation at apex - no perfusion defects     Medications:  Prescriptions prior to admission  Medication Sig Dispense Refill  . albuterol (PROVENTIL HFA;VENTOLIN HFA) 108 (90 BASE) MCG/ACT inhaler Inhale 2 puffs into the lungs every 4 (four) hours as needed for shortness of breath. Shortness of breath and wheezing  Patient is using Pro-Air brand      .  albuterol (PROVENTIL) (5 MG/ML) 0.5% nebulizer solution Take 2.5 mg by nebulization every 2 (two) hours as needed for wheezing or shortness of breath.      . ALPRAZolam (XANAX) 1 MG tablet Take 1 mg by mouth 3 (three) times daily as needed for sleep or anxiety.      Marland Kitchen anastrozole (ARIMIDEX) 1 MG tablet Take 1 mg by mouth at bedtime.       Marland Kitchen aspirin 325 MG EC tablet Take 325 mg by mouth daily.      . bisacodyl (DULCOLAX) 5 MG EC tablet Take 10 mg by mouth 2 (two) times daily as needed (for constipation).      . budesonide-formoterol (SYMBICORT) 160-4.5 MCG/ACT inhaler Inhale 2 puffs into the lungs 2 (two) times daily.      . calcium-vitamin D (OSCAL WITH D) 250-125 MG-UNIT per tablet Take 1 tablet by mouth daily.      . diclofenac sodium (VOLTAREN) 1 % GEL Apply 1 application topically daily as needed (apply to hands). Hand pain      . furosemide (LASIX) 40 MG tablet Take 40 mg by mouth 2 (two) times daily.       Marland Kitchen ipratropium (ATROVENT HFA) 17 MCG/ACT inhaler Inhale 2 puffs into the lungs every 4 (four) hours as needed (for shortness of breath).      . lactulose (CHRONULAC) 10 GM/15ML solution Take  20 g by mouth 2 (two) times daily as needed (constipation).      . montelukast (SINGULAIR) 10 MG tablet Take 10 mg by mouth at bedtime.       . Multiple Vitamins-Minerals (MULTIVITAMIN WITH MINERALS) tablet Take 1 tablet by mouth daily.      . nortriptyline (PAMELOR) 25 MG capsule Take 2 capsules (50 mg total) by mouth at bedtime.  60 capsule  3  . Omega 3 1200 MG CAPS Take 1,200 mg by mouth daily.       . ondansetron (ZOFRAN) 8 MG tablet Take 8 mg by mouth 2 (two) times daily as needed for nausea or vomiting.      . OXYGEN-HELIUM IN Inhale 3 L into the lungs as needed.      . pantoprazole (PROTONIX) 40 MG tablet Take 40 mg by mouth daily.      . potassium chloride SA (K-DUR,KLOR-CON) 20 MEQ tablet Take 20 mEq by mouth daily.      . Zoledronic Acid (ZOMETA IV) Inject 4 mg into the vein every 6 (six)  months.       . Butalbital-APAP-Caffeine (FIORICET PO) Take 1 capsule by mouth daily as needed (for migraines).        Assessment: 68 yo female with SOB, possible PNA, for empiric antibiotics  Goal of Therapy:  Vancomycin trough level 15-20 mcg/ml  Plan:  Vancomycin 1 g IV now, then 500 mg IV q12h  Deondre Marinaro, Bronson Curb 11/06/2013,1:57 AM

## 2013-11-07 DIAGNOSIS — I2510 Atherosclerotic heart disease of native coronary artery without angina pectoris: Secondary | ICD-10-CM

## 2013-11-07 DIAGNOSIS — J441 Chronic obstructive pulmonary disease with (acute) exacerbation: Secondary | ICD-10-CM

## 2013-11-07 DIAGNOSIS — J9610 Chronic respiratory failure, unspecified whether with hypoxia or hypercapnia: Secondary | ICD-10-CM

## 2013-11-07 MED ORDER — IPRATROPIUM-ALBUTEROL 0.5-2.5 (3) MG/3ML IN SOLN
3.0000 mL | Freq: Three times a day (TID) | RESPIRATORY_TRACT | Status: DC
  Administered 2013-11-08: 3 mL via RESPIRATORY_TRACT
  Filled 2013-11-07: qty 3

## 2013-11-07 MED ORDER — LACTULOSE 10 GM/15ML PO SOLN
20.0000 g | Freq: Once | ORAL | Status: AC
  Administered 2013-11-07: 20 g via ORAL
  Filled 2013-11-07: qty 30

## 2013-11-07 MED ORDER — METHYLPREDNISOLONE SODIUM SUCC 125 MG IJ SOLR
60.0000 mg | Freq: Two times a day (BID) | INTRAMUSCULAR | Status: DC
  Administered 2013-11-07 – 2013-11-08 (×2): 60 mg via INTRAVENOUS
  Filled 2013-11-07 (×4): qty 0.96

## 2013-11-07 NOTE — Progress Notes (Addendum)
[facility] PROGRESS NOTE  JAKIRA MCFADDEN TDD:[id number] DOB: Dec 22, 1945 DOA: 11/05/2013 PCP: Cloyd Stagers, MD  HPI/Subjective: [name] is a 68 y.o. female, with PMH significant for COPD (on 3L at home when sleeping), CAD s/p MI in the past, history of CHF, Aortic Stenosis presenting today with one-day history of SOB, cough with yellowish sputum, and fever of 102. The patient had a cath with Dr. Zoila Shutter about 2 months ago.   Currently the patient still has a cough and is not experiencing any SOB with 3L of O2.  Assessment/Plan: Principal Problem:  HCAP (healthcare-associated pneumonia)  Active Problems:  COPD (chronic obstructive pulmonary disease)  Tobacco abuse  HTN (hypertension)  CAD (coronary artery disease) of artery bypass graft   Principal Problem:   COPD Exacerbation:  -Likely secondary to [facility] shows patchy consolidation of medial L lung base  - Significantly improved and on admission -Continue Levaquin date 2, we'll stop vancomycin. -WBC of 27.9  -Blood cultures negative - Change IV solumedrol to 60 mg twice a day, continue with denies bronchodilator  -RN to ambulate pt with pulse ox   Community acquired pneumonia - Continue Levaquin, afebrile, leukocytosis worsened because of steroids. - Will need a repeat chest x-ray in 6-8 weeks  Chronic diastolic heart failure - Clinically compensated, continue Lasix  HTN:  -Per pt her BP normally runs low, asymptomatic  -BP on 2/10 96/58 -Continue home regimen Lasix 40 mg BID   Breast CA:  -Continue Anastrozole   CAD:  -Continue ASA   Tobacco Use:  -Per patient hx, says she has cut down smoking in last few years  -Counseled pt on need for cessation   DVT Prophylaxis:  Lovenox Code Status: Full Family Communication: No family at the bedside this morning  Disposition Plan: Remain inpatient today with possible D/C to home tomorrow if medically appropriate  Consultants:  None    Procedures:  None   Antibiotics:  None  Objective: Filed Vitals:   11/06/13 2033 11/07/13 0459 11/07/13 0815 11/07/13 0957  BP:  96/58    Pulse:  78    Temp:  97.9 F (36.6 C)    TempSrc:  Oral    Resp:  20    Height:      Weight:      SpO2: 94% 97% 90% 96%    Intake/Output Summary (Last 24 hours) at 11/07/13 1024 Last data filed at 11/07/13 0540  Gross per 24 hour  Intake   1370 ml  Output      0 ml  Net   1370 ml   Filed Weights   11/05/13 2219 11/06/13 0202  Weight: 52.663 kg (116 lb 1.6 oz) 52.663 kg (116 lb 1.6 oz)   Exam: General: NAD, appears stated age  Cardiovascular: RRR, S1 S2 auscultated, with a minimal systolic murmur auscultated best in aortic area  Respiratory: Crackles B/L at the bases with minimal end expiratory wheezes  Abdomen: Soft, nontender, nondistended, + bowel sounds  Extremities: warm dry without cyanosis clubbing or edema.  Neuro: AAOx3, Strength 5/5 in upper and lower extremities.  Skin: Without rashes exudates or nodules.  Psych: Normal affect and demeanor with intact judgement and insight  Data Reviewed: Basic Metabolic Panel:  Recent Labs Lab 11/05/13 2230 11/06/13 0534  NA 141  --   K 3.9  --   CL 96  --   CO2 30  --   GLUCOSE 115*  --   BUN 15  --   CREATININE 0.86  0.89  CALCIUM 9.8  --   CBC:  Recent Labs Lab 11/05/13 2230 11/06/13 0534  WBC 23.9* 27.9*  HGB 14.0 12.1  HCT 41.5 36.9  MCV 97.6 97.6  PLT 199 246  BNP (last 3 results)  Recent Labs  06/19/13 1900 06/21/13 0502 06/22/13 0400  PROBNP 5267.0* 3048.0* 1715.0*   Recent Results (from the past 240 hour(s))  CULTURE, EXPECTORATED SPUTUM-ASSESSMENT     Status: None   Collection Time    11/06/13  2:49 AM      Result Value Ref Range Status   Specimen Description SPUTUM   Final   Special Requests NONE   Final   Sputum evaluation     Final   Value: THIS SPECIMEN IS ACCEPTABLE. RESPIRATORY CULTURE REPORT TO FOLLOW.   Report Status 11/06/2013  FINAL   Final  CULTURE, RESPIRATORY (NON-EXPECTORATED)     Status: None   Collection Time    11/06/13  2:49 AM      Result Value Ref Range Status   Specimen Description SPUTUM   Final   Special Requests NONE   Final   Gram Stain     Final   Value: MODERATE WBC PRESENT,BOTH PMN AND MONONUCLEAR     NO SQUAMOUS EPITHELIAL CELLS SEEN     RARE GRAM POSITIVE COCCI IN PAIRS     RARE GRAM NEGATIVE RODS     Performed at [facility]   Culture PENDING   Incomplete   Report Status PENDING   Incomplete  CULTURE, BLOOD (ROUTINE X 2)     Status: None   Collection Time    11/06/13  5:30 AM      Result Value Ref Range Status   Specimen Description BLOOD LEFT ANTECUBITAL   Final   Special Requests BOTTLES DRAWN AEROBIC ONLY 4CC   Final   Culture  Setup Time     Final   Value: 11/06/2013 10:21     Performed at [facility]   Culture     Final   Value:        BLOOD CULTURE RECEIVED NO GROWTH TO DATE CULTURE WILL BE HELD FOR 5 DAYS BEFORE ISSUING A FINAL NEGATIVE REPORT     Performed at [facility]   Report Status PENDING   Incomplete  CULTURE, BLOOD (ROUTINE X 2)     Status: None   Collection Time    11/06/13  5:34 AM      Result Value Ref Range Status   Specimen Description BLOOD LEFT FOREARM   Final   Special Requests BOTTLES DRAWN AEROBIC ONLY 4CC   Final   Culture  Setup Time     Final   Value: 11/06/2013 10:21     Performed at [facility]   Culture     Final   Value:        BLOOD CULTURE RECEIVED NO GROWTH TO DATE CULTURE WILL BE HELD FOR 5 DAYS BEFORE ISSUING A FINAL NEGATIVE REPORT     Performed at [facility]   Report Status PENDING   Incomplete    Studies: Dg Chest 2 View (if Patient Has Fever And/or Copd)  11/05/2013   CLINICAL DATA:  Shortness of breath  EXAM: CHEST  2 VIEW  COMPARISON:  June 21, 2013  FINDINGS: The heart size and mediastinal contours are stable. There is pulmonary edema. There is patchy consolidation of medial left  mid to lung base. There is no pleural effusion. The visualized skeletal  structures are stable.  IMPRESSION: Pulmonary edema. Patchy consolidation of medial left mid to lung base, superimposed pneumonia not excluded.   Electronically Signed   By: Abelardo Diesel M.D.   On: 11/05/2013 23:20   Scheduled Meds: . anastrozole  1 mg Oral QHS  . antiseptic oral rinse  15 mL Mouth Rinse BID  . aspirin  325 mg Oral Daily  . budesonide-formoterol  2 puff Inhalation BID  . calcium-vitamin D  1 tablet Oral Q breakfast  . enoxaparin (LOVENOX) injection  30 mg Subcutaneous Q24H  . furosemide  40 mg Oral BID  . ipratropium-albuterol  3 mL Nebulization Q6H  . levofloxacin (LEVAQUIN) IV  750 mg Intravenous Q24H  . methylPREDNISolone (SOLU-MEDROL) injection  80 mg Intravenous Q6H  . montelukast  10 mg Oral QHS  . multivitamin with minerals  1 tablet Oral Daily  . nortriptyline  50 mg Oral QHS  . omega-3 acid ethyl esters  1 g Oral Daily  . pantoprazole  40 mg Oral Daily  . potassium chloride SA  20 mEq Oral Daily  . vancomycin  500 mg Intravenous Q12H   Principal Problem:   HCAP (healthcare-associated pneumonia) Active Problems:   COPD (chronic obstructive pulmonary disease)   Tobacco abuse   HTN (hypertension)   CAD (coronary artery disease) of artery bypass graft  Lang Snow, PA-S  [facility] Pager [phone]. If 7PM-7AM, please contact night-coverage at [email or web address], password [facility] 11/07/2013, 10:24 AM  LOS: 2 days   Attending Patient seen and examined, and reviewed the above assessment and plan. 68 year old lady history of COPD, ongoing tobacco use on home O2 presented with worsening shortness of breath. Found to have pneumonia. Placed on IV .Levaquin, vancomycin and Solu-Medrol, along with nebulized bronchodilators. Patient seems to have improved significantly, down to 3 L of oxygen. Decrease Solu-Medrol to 60 twice a day, continue Levaquin, stop vancomycin. Ambulate. Follow clinical  course. Suspect home in the next 1-2 days if clinical improvement continues   Nena Alexander MD

## 2013-11-07 NOTE — Progress Notes (Signed)
ANTIBIOTIC CONSULT NOTE - INITIAL  Pharmacy Consult for Levaquin  Indication: CAP  Allergies  Allergen Reactions  . Ceftriaxone Other (See Comments)    "Blow up like a balloon"    Patient Measurements: Height: 5\' 2"  (157.5 cm) Weight: 116 lb 1.6 oz (52.663 kg) IBW/kg (Calculated) : 50.1  Vital Signs: Temp: 98.4 F (36.9 C) (02/11 1100) Temp src: Oral (02/11 1100) BP: 109/52 mmHg (02/11 1100) Pulse Rate: 87 (02/11 1100) Intake/Output from previous day: 02/10 0701 - 02/11 0700 In: 1370 [P.O.:1020; IV Piggyback:350] Out: -  Intake/Output from this shift: Total I/O In: 300 [P.O.:300] Out: -   Labs:  Recent Labs  11/05/13 2230 11/06/13 0534  WBC 23.9* 27.9*  HGB 14.0 12.1  PLT 199 246  CREATININE 0.86 0.89   Estimated Creatinine Clearance: 48.5 ml/min (by C-G formula based on Cr of 0.89). No results found for this basename: VANCOTROUGH, [place]lis Leak, VANCORANDOM, [place], GENTPEAK, GENTRANDOM, TOBRATROUGH, TOBRAPEAK, TOBRARND, AMIKACINPEAK, AMIKACINTROU, AMIKACIN,  in the last 72 hours   Microbiology: Recent Results (from the past 720 hour(s))  CULTURE, EXPECTORATED SPUTUM-ASSESSMENT     Status: None   Collection Time    11/06/13  2:49 AM      Result Value Ref Range Status   Specimen Description SPUTUM   Final   Special Requests NONE   Final   Sputum evaluation     Final   Value: THIS SPECIMEN IS ACCEPTABLE. RESPIRATORY CULTURE REPORT TO FOLLOW.   Report Status 11/06/2013 FINAL   Final  CULTURE, RESPIRATORY (NON-EXPECTORATED)     Status: None   Collection Time    11/06/13  2:49 AM      Result Value Ref Range Status   Specimen Description SPUTUM   Final   Special Requests NONE   Final   Gram Stain     Final   Value: MODERATE WBC PRESENT,BOTH PMN AND MONONUCLEAR     NO SQUAMOUS EPITHELIAL CELLS SEEN     RARE GRAM POSITIVE COCCI IN PAIRS     RARE GRAM NEGATIVE RODS     Performed at [facility]   Culture     Final   Value: NORMAL OROPHARYNGEAL  FLORA     Performed at [facility]   Report Status PENDING   Incomplete  CULTURE, BLOOD (ROUTINE X 2)     Status: None   Collection Time    11/06/13  5:30 AM      Result Value Ref Range Status   Specimen Description BLOOD LEFT ANTECUBITAL   Final   Special Requests BOTTLES DRAWN AEROBIC ONLY 4CC   Final   Culture  Setup Time     Final   Value: 11/06/2013 10:21     Performed at [facility]   Culture     Final   Value:        BLOOD CULTURE RECEIVED NO GROWTH TO DATE CULTURE WILL BE HELD FOR 5 DAYS BEFORE ISSUING A FINAL NEGATIVE REPORT     Performed at [facility]   Report Status PENDING   Incomplete  CULTURE, BLOOD (ROUTINE X 2)     Status: None   Collection Time    11/06/13  5:34 AM      Result Value Ref Range Status   Specimen Description BLOOD LEFT FOREARM   Final   Special Requests BOTTLES DRAWN AEROBIC ONLY 4CC   Final   Culture  Setup Time     Final   Value: 11/06/2013 10:21  Performed at [facility]     Final   Value:        BLOOD CULTURE RECEIVED NO GROWTH TO DATE CULTURE WILL BE HELD FOR 5 DAYS BEFORE ISSUING A FINAL NEGATIVE REPORT     Performed at [facility]   Report Status PENDING   Incomplete    Medical History: Past Medical History  Diagnosis Date  . Angina   . Heart murmur   . Depressed   . Coronary artery disease 2006    2 stents w/previous MI  . Hypertension   . Migraine   . Anxiety   . Myocardial infarction 2006    NSTEMI  . COPD (chronic obstructive pulmonary disease)     oxygen-dependent 4LPM [place]  . Moderate to severe pulmonary hypertension   . Diastolic CHF   . Cancer of breast dx'd 2008    RT Breast  . Aortic stenosis     mild  . Dyslipidemia   . NSVT (nonsustained ventricular tachycardia)     h/o  . History of nuclear stress test 08/03/2011    attenuation at apex - no perfusion defects     Medications:  Prescriptions prior to admission  Medication Sig Dispense Refill  .  albuterol (PROVENTIL HFA;VENTOLIN HFA) 108 (90 BASE) MCG/ACT inhaler Inhale 2 puffs into the lungs every 4 (four) hours as needed for shortness of breath. Shortness of breath and wheezing  Patient is using Pro-Air brand      . albuterol (PROVENTIL) (5 MG/ML) 0.5% nebulizer solution Take 2.5 mg by nebulization every 2 (two) hours as needed for wheezing or shortness of breath.      . ALPRAZolam (XANAX) 1 MG tablet Take 1 mg by mouth 3 (three) times daily as needed for sleep or anxiety.      Marland Kitchen anastrozole (ARIMIDEX) 1 MG tablet Take 1 mg by mouth at bedtime.       Marland Kitchen aspirin 325 MG EC tablet Take 325 mg by mouth daily.      . bisacodyl (DULCOLAX) 5 MG EC tablet Take 10 mg by mouth 2 (two) times daily as needed (for constipation).      . budesonide-formoterol (SYMBICORT) 160-4.5 MCG/ACT inhaler Inhale 2 puffs into the lungs 2 (two) times daily.      . calcium-vitamin D (OSCAL WITH D) 250-125 MG-UNIT per tablet Take 1 tablet by mouth daily.      . diclofenac sodium (VOLTAREN) 1 % GEL Apply 1 application topically daily as needed (apply to hands). Hand pain      . furosemide (LASIX) 40 MG tablet Take 40 mg by mouth 2 (two) times daily.       Marland Kitchen ipratropium (ATROVENT HFA) 17 MCG/ACT inhaler Inhale 2 puffs into the lungs every 4 (four) hours as needed (for shortness of breath).      . lactulose (CHRONULAC) 10 GM/15ML solution Take 20 g by mouth 2 (two) times daily as needed (constipation).      . montelukast (SINGULAIR) 10 MG tablet Take 10 mg by mouth at bedtime.       . Multiple Vitamins-Minerals (MULTIVITAMIN WITH MINERALS) tablet Take 1 tablet by mouth daily.      . nortriptyline (PAMELOR) 25 MG capsule Take 2 capsules (50 mg total) by mouth at bedtime.  60 capsule  3  . Omega 3 1200 MG CAPS Take 1,200 mg by mouth daily.       . ondansetron (ZOFRAN) 8 MG tablet Take 8 mg by  mouth 2 (two) times daily as needed for nausea or vomiting.      . OXYGEN-HELIUM IN Inhale 3 L into the lungs as needed.      .  pantoprazole (PROTONIX) 40 MG tablet Take 40 mg by mouth daily.      . potassium chloride SA (K-DUR,KLOR-CON) 20 MEQ tablet Take 20 mEq by mouth daily.      . Zoledronic Acid (ZOMETA IV) Inject 4 mg into the vein every 6 (six) months.       . Butalbital-APAP-Caffeine (FIORICET PO) Take 1 capsule by mouth daily as needed (for migraines).        Assessment: 68 yo female with SOB secondary to COPD exacerbation on Levaquin for community acquired pneumonia. WBC 27.9. CrCl ~ 50 mL/min. Blood cultures remain negative. Afebrile   Goal of Therapy:  Eradication of infection   Plan:  1) Levaquin 750 mg IV Q 24 hours.  2) F/u CBC, cultures, renal fx and patient clinical status  Albertina Parr, PharmD.  Clinical Pharmacist Pager [phone]

## 2013-11-08 ENCOUNTER — Ambulatory Visit: Payer: Medicare Other | Admitting: Physician Assistant

## 2013-11-08 DIAGNOSIS — I509 Heart failure, unspecified: Secondary | ICD-10-CM

## 2013-11-08 DIAGNOSIS — J441 Chronic obstructive pulmonary disease with (acute) exacerbation: Secondary | ICD-10-CM | POA: Diagnosis present

## 2013-11-08 DIAGNOSIS — I10 Essential (primary) hypertension: Secondary | ICD-10-CM

## 2013-11-08 LAB — BASIC METABOLIC PANEL
BUN: 28 mg/dL — ABNORMAL HIGH (ref 6–23)
CO2: 29 mEq/L (ref 19–32)
Calcium: 9.7 mg/dL (ref 8.4–10.5)
Chloride: 98 mEq/L (ref 96–112)
Creatinine, Ser: 1 mg/dL (ref 0.50–1.10)
GFR calc Af Amer: 66 mL/min — ABNORMAL LOW (ref >90)
GFR calc non Af Amer: 57 mL/min — ABNORMAL LOW (ref >90)
Glucose, Bld: 98 mg/dL (ref 70–99)
Potassium: 4.9 mEq/L (ref 3.7–5.3)
Sodium: 138 mEq/L (ref 137–147)

## 2013-11-08 LAB — CBC
HCT: 36.7 % (ref 36.0–46.0)
Hemoglobin: 12.1 g/dL (ref 12.0–15.0)
MCH: 32.3 pg (ref 26.0–34.0)
MCHC: 33 g/dL (ref 30.0–36.0)
MCV: 97.9 fL (ref 78.0–100.0)
Platelets: 276 10*3/uL (ref 150–400)
RBC: 3.75 MIL/uL — ABNORMAL LOW (ref 3.87–5.11)
RDW: 13.7 % (ref 11.5–15.5)
WBC: 15.9 10*3/uL — ABNORMAL HIGH (ref 4.0–10.5)

## 2013-11-08 LAB — CULTURE, RESPIRATORY W GRAM STAIN: Culture: NORMAL

## 2013-11-08 MED ORDER — PREDNISONE 10 MG PO TABS: ORAL_TABLET | ORAL | Status: DC

## 2013-11-08 MED ORDER — LEVOFLOXACIN 750 MG PO TABS: 750.0000 mg | ORAL_TABLET | Freq: Every day | ORAL | Status: DC

## 2013-11-08 NOTE — Discharge Summary (Signed)
PATIENT DETAILS Name: [name] Age: 68 y.o. Sex: female Date of Birth: Jan 31, 1946 MRN: [id number]. Admit Date: 11/05/2013 Admitting Physician: Merton Border, MD [id number] Collier Flowers, MD  Recommendations for Outpatient Follow-up:  1. Please repeat chest x-ray in 6-8 weeks to document resolution of the pneumonia  PRIMARY DISCHARGE DIAGNOSIS:  Principal Problem: Community acquired pneumonia Active Problems:   COPD (chronic obstructive pulmonary disease) exacerbation   Tobacco abuse   HTN (hypertension)   CAD (coronary artery disease) of artery bypass graft      PAST MEDICAL HISTORY: Past Medical History  Diagnosis Date  . Angina   . Heart murmur   . Depressed   . Coronary artery disease 2006    2 stents w/previous MI  . Hypertension   . Migraine   . Anxiety   . Myocardial infarction 2006    NSTEMI  . COPD (chronic obstructive pulmonary disease)     oxygen-dependent 4LPM [place]  . Moderate to severe pulmonary hypertension   . Diastolic CHF   . Cancer of breast dx'd 2008    RT Breast  . Aortic stenosis     mild  . Dyslipidemia   . NSVT (nonsustained ventricular tachycardia)     h/o  . History of nuclear stress test 08/03/2011    attenuation at apex - no perfusion defects     DISCHARGE MEDICATIONS:   Medication List         albuterol 108 (90 BASE) MCG/ACT inhaler  Commonly known as:  PROVENTIL HFA;VENTOLIN HFA  Inhale 2 puffs into the lungs every 4 (four) hours as needed for shortness of breath. Shortness of breath and wheezing  Patient is using Pro-Air brand     albuterol (5 MG/ML) 0.5% nebulizer solution  Commonly known as:  PROVENTIL  Take 2.5 mg by nebulization every 2 (two) hours as needed for wheezing or shortness of breath.     ALPRAZolam 1 MG tablet  Commonly known as:  XANAX  Take 1 mg by mouth 3 (three) times daily as needed for sleep or anxiety.     anastrozole 1 MG tablet  Commonly known as:  ARIMIDEX  Take 1 mg by mouth at  bedtime.     aspirin 325 MG EC tablet  Take 325 mg by mouth daily.     bisacodyl 5 MG EC tablet  Commonly known as:  DULCOLAX  Take 10 mg by mouth 2 (two) times daily as needed (for constipation).     budesonide-formoterol 160-4.5 MCG/ACT inhaler  Commonly known as:  SYMBICORT  Inhale 2 puffs into the lungs 2 (two) times daily.     calcium-vitamin D 250-125 MG-UNIT per tablet  Commonly known as:  OSCAL WITH D  Take 1 tablet by mouth daily.     diclofenac sodium 1 % Gel  Commonly known as:  VOLTAREN  Apply 1 application topically daily as needed (apply to hands). Hand pain     FIORICET PO  Take 1 capsule by mouth daily as needed (for migraines).     furosemide 40 MG tablet  Commonly known as:  LASIX  Take 40 mg by mouth 2 (two) times daily.     ipratropium 17 MCG/ACT inhaler  Commonly known as:  ATROVENT HFA  Inhale 2 puffs into the lungs every 4 (four) hours as needed (for shortness of breath).     lactulose 10 GM/15ML solution  Commonly known as:  CHRONULAC  Take 20 g by mouth 2 (two) times daily as needed (constipation).  levofloxacin 750 MG tablet  Commonly known as:  LEVAQUIN  Take 1 tablet (750 mg total) by mouth daily.     montelukast 10 MG tablet  Commonly known as:  SINGULAIR  Take 10 mg by mouth at bedtime.     multivitamin with minerals tablet  Take 1 tablet by mouth daily.     nortriptyline 25 MG capsule  Commonly known as:  PAMELOR  Take 2 capsules (50 mg total) by mouth at bedtime.     Omega 3 1200 MG Caps  Take 1,200 mg by mouth daily.     ondansetron 8 MG tablet  Commonly known as:  ZOFRAN  Take 8 mg by mouth 2 (two) times daily as needed for nausea or vomiting.     OXYGEN-HELIUM IN  Inhale 3 L into the lungs as needed.     pantoprazole 40 MG tablet  Commonly known as:  PROTONIX  Take 40 mg by mouth daily.     potassium chloride SA 20 MEQ tablet  Commonly known as:  K-DUR,KLOR-CON  Take 20 mEq by mouth daily.     predniSONE 10 MG  tablet  Commonly known as:  DELTASONE  - Take 4 tablets (40 mg) daily for 2 days, then,  - Take 3 tablets (30 mg) daily for 2 days, then,  - Take 2 tablets (20 mg) daily for 2 days, then,  - Take 1 tablets (10 mg) daily for 1 days, then stop     ZOMETA IV  Inject 4 mg into the vein every 6 (six) months.        ALLERGIES:   Allergies  Allergen Reactions  . Ceftriaxone Other (See Comments)    "Blow up like a balloon"    BRIEF HPI:  See H&P, Labs, Consult and Test reports for all details in brief, patient is a 68 year old female with a history of COPD on home O2, coronary artery disease who presented to the ED with worsening shortness of breath. This was associated with cough with yellow phlegm and a fever of 102F.  CONSULTATIONS:   None  PERTINENT RADIOLOGIC STUDIES: Dg Chest 2 View (if Patient Has Fever And/or Copd)  11/05/2013   CLINICAL DATA:  Shortness of breath  EXAM: CHEST  2 VIEW  COMPARISON:  June 21, 2013  FINDINGS: The heart size and mediastinal contours are stable. There is pulmonary edema. There is patchy consolidation of medial left mid to lung base. There is no pleural effusion. The visualized skeletal structures are stable.  IMPRESSION: Pulmonary edema. Patchy consolidation of medial left mid to lung base, superimposed pneumonia not excluded.   Electronically Signed   By: Abelardo Diesel M.D.   On: 11/05/2013 23:20     PERTINENT LAB RESULTS: CBC:  Recent Labs  11/06/13 0534 11/08/13 0650  WBC 27.9* 15.9*  HGB 12.1 12.1  HCT 36.9 36.7  PLT 246 276   CMET CMP     Component Value Date/Time   NA 138 11/08/2013 0650   NA 142 08/21/2013 1349   K 4.9 11/08/2013 0650   K 3.8 08/21/2013 1349   CL 98 11/08/2013 0650   CO2 29 11/08/2013 0650   CO2 34* 08/21/2013 1349   GLUCOSE 98 11/08/2013 0650   GLUCOSE 119 08/21/2013 1349   BUN 28* 11/08/2013 0650   BUN 17.2 08/21/2013 1349   CREATININE 1.00 11/08/2013 0650   CREATININE 1.0 08/21/2013 1349   CALCIUM  9.7 11/08/2013 0650   CALCIUM 11.0* 08/21/2013 1349   PROT  8.2 08/21/2013 1349   PROT 5.9* 06/25/2013 0515   ALBUMIN 3.2* 08/21/2013 1349   ALBUMIN 2.5* 06/25/2013 0515   AST 21 08/21/2013 1349   AST 74* 06/25/2013 0515   ALT 27 08/21/2013 1349   ALT 795* 06/25/2013 0515   ALKPHOS 87 08/21/2013 1349   ALKPHOS 115 06/25/2013 0515   BILITOT 0.40 08/21/2013 1349   BILITOT 0.3 06/25/2013 0515   GFRNONAA 57* 11/08/2013 0650   GFRAA 66* 11/08/2013 0650    GFR Estimated Creatinine Clearance: 43.2 ml/min (by C-G formula based on Cr of 1). No results found for this basename: LIPASE, AMYLASE,  in the last 72 hours No results found for this basename: CKTOTAL, CKMB, CKMBINDEX, TROPONINI,  in the last 72 hours No components found with this basename: POCBNP,  No results found for this basename: DDIMER,  in the last 72 hours No results found for this basename: HGBA1C,  in the last 72 hours No results found for this basename: CHOL, HDL, LDLCALC, TRIG, CHOLHDL, LDLDIRECT,  in the last 72 hours No results found for this basename: TSH, T4TOTAL, FREET3, T3FREE, THYROIDAB,  in the last 72 hours No results found for this basename: VITAMINB12, FOLATE, FERRITIN, TIBC, IRON, RETICCTPCT,  in the last 72 hours Coags: No results found for this basename: PT, INR,  in the last 72 hours Microbiology: Recent Results (from the past 240 hour(s))  CULTURE, EXPECTORATED SPUTUM-ASSESSMENT     Status: None   Collection Time    11/06/13  2:49 AM      Result Value Ref Range Status   Specimen Description SPUTUM   Final   Special Requests NONE   Final   Sputum evaluation     Final   Value: THIS SPECIMEN IS ACCEPTABLE. RESPIRATORY CULTURE REPORT TO FOLLOW.   Report Status 11/06/2013 FINAL   Final  CULTURE, RESPIRATORY (NON-EXPECTORATED)     Status: None   Collection Time    11/06/13  2:49 AM      Result Value Ref Range Status   Specimen Description SPUTUM   Final   Special Requests NONE   Final   Gram Stain     Final    Value: MODERATE WBC PRESENT,BOTH PMN AND MONONUCLEAR     NO SQUAMOUS EPITHELIAL CELLS SEEN     RARE GRAM POSITIVE COCCI IN PAIRS     RARE GRAM NEGATIVE RODS     Performed at [facility]   Culture     Final   Value: NORMAL OROPHARYNGEAL FLORA     Performed at [facility]   Report Status 11/08/2013 FINAL   Final  CULTURE, BLOOD (ROUTINE X 2)     Status: None   Collection Time    11/06/13  5:30 AM      Result Value Ref Range Status   Specimen Description BLOOD LEFT ANTECUBITAL   Final   Special Requests BOTTLES DRAWN AEROBIC ONLY 4CC   Final   Culture  Setup Time     Final   Value: 11/06/2013 10:21     Performed at [facility]   Culture     Final   Value:        BLOOD CULTURE RECEIVED NO GROWTH TO DATE CULTURE WILL BE HELD FOR 5 DAYS BEFORE ISSUING A FINAL NEGATIVE REPORT     Performed at [facility]   Report Status PENDING   Incomplete  CULTURE, BLOOD (ROUTINE X 2)     Status: None   Collection Time  11/06/13  5:34 AM      Result Value Ref Range Status   Specimen Description BLOOD LEFT FOREARM   Final   Special Requests BOTTLES DRAWN AEROBIC ONLY 4CC   Final   Culture  Setup Time     Final   Value: 11/06/2013 10:21     Performed at [facility]   Culture     Final   Value:        BLOOD CULTURE RECEIVED NO GROWTH TO DATE CULTURE WILL BE HELD FOR 5 DAYS BEFORE ISSUING A FINAL NEGATIVE REPORT     Performed at [facility]   Report Status PENDING   Incomplete     BRIEF HOSPITAL COURSE:  COPD exacerbation - patient was admitted, and started on IV Solu-Medrol, nebulized bronchodilators and empiric antibiotic therapy. She has a history of chronic respiratory failure and was continued on the usual amount of oxygen in the hospital. With these measures, patient rapidly improved. By day of discharge, her lungs were clear and she was able to ambulate, she claims that she is back to her usual baseline. Her symptoms have significantly  improved and she is thought to be stable for discharge. On discharge, we will taper her prednisone over a week, continue with her usual inhaler regimen.  Community acquired pneumonia - On admission, chest x-ray showed bilateral pneumonia, although she had a cardiac catheterization approximately 2 months ago, she stayed less than 48 hours in the hospital, therefore she was treated as community acquired pneumonia. She is now afebrile, leukocytosis is significantly better, would change Levaquin to oral and discharge her with 4 more days of Levaquin to complete one week course. - She will need a repeat chest x-ray in 6-8 weeks time. - Blood cultures remain negative, sputum cultures showed normal oropharyngeal flora. Respiratory virus panel is positive for rhinovirus  Chronic respiratory failure - Secondary to underlying COPD. Continue with usual regimen of oxygen at home  Chronic diastolic heart failure  - Clinically compensated, continue Lasix   HTN:  - Stable, continue Lasix.  Breast CA:  -Continue Anastrozole   CAD:  -Continue ASA   Tobacco Use:  -Per patient hx, says she has cut down smoking in last few years  -Counseled pt on need for cessation  - She has been counseled extensively and has been told about the harmful effects of smoking and using oxygen.  TODAY-DAY OF DISCHARGE:  Subjective:   [place] today has no headache,no chest abdominal pain,no new weakness tingling or numbness, feels much better wants to go home today.  Objective:   Blood pressure 113/63, pulse 77, temperature 98.1 F (36.7 C), temperature source Oral, resp. rate 20, height 5\' 2"  (1.575 m), weight 52.663 kg (116 lb 1.6 oz), SpO2 99.00%.  Intake/Output Summary (Last 24 hours) at 11/08/13 1134 Last data filed at 11/07/13 2219  Gross per 24 hour  Intake    472 ml  Output      0 ml  Net    472 ml   Filed Weights   11/05/13 2219 11/06/13 0202  Weight: 52.663 kg (116 lb 1.6 oz) 52.663 kg (116 lb  1.6 oz)    Exam Awake Alert, Oriented *3, No new F.N deficits, Normal affect [place].AT,PERRAL Supple Neck,No JVD, No cervical lymphadenopathy appriciated.  Symmetrical Chest wall movement, Good air movement bilaterally, CTAB RRR,No Gallops,Rubs or new Murmurs, No Parasternal Heave +ve B.Sounds, Abd Soft, Non tender, No organomegaly appriciated, No rebound -guarding or rigidity. No Cyanosis, Clubbing  or edema, No new Rash or bruise  DISCHARGE CONDITION: Stable  DISPOSITION: Home   DISCHARGE INSTRUCTIONS:    Activity:  As tolerated  Diet recommendation: Heart Healthy diet  Discharge Orders   Future Appointments Provider Department Dept Phone   11/12/2013 10:30 AM Juanito Doom, MD [facility] [phone]   11/30/2013 1:45 PM Webster, DO [facility] [phone]   02/18/2014 2:00 PM [facility] [phone]   02/18/2014 2:30 PM Chauncey Cruel, MD [facility] [phone]   02/18/2014 3:30 PM [facility] [phone]   Future Orders Complete By Expires   Call MD for:  difficulty breathing, headache or visual disturbances  As directed    Diet - low sodium heart healthy  As directed    Increase activity slowly  As directed       Follow-up Information   Follow up with Simonne Maffucci, MD On 11/12/2013. (10:30 am)    Specialty:  Pulmonary Disease   Contact information:   [id number] [place] [address] [phone]       Follow up with [facility], IBTEHAL, MD. Schedule an appointment as soon as possible for a visit in 2 weeks.   Specialty:  Internal Medicine   Contact information:   [address] [place] [address] [phone]      Total Time spent on discharge equals 45 minutes.  SignedOren Binet 11/08/2013 11:34 AM

## 2013-11-08 NOTE — Care Management Note (Signed)
    Page 1 of 1   11/08/2013     12:18:49 PM   CARE MANAGEMENT NOTE 11/08/2013  Patient:  [name], [name]   Account Number:  [id number]  Date Initiated:  11/08/2013  Documentation initiated by:  Tomi Bamberger  Subjective/Objective Assessment:   dx pna  admit- lives with friend.     Action/Plan:   Anticipated DC Date:  11/08/2013   Anticipated DC Plan:  [place]  CM consult      Choice offered to / List presented to:             Status of service:  Completed, signed off Medicare Important Message given?   (If response is "NO", the following Medicare IM given date fields will be blank) Date Medicare IM given:   Date Additional Medicare IM given:    Discharge Disposition:  HOME/SELF CARE  Per UR Regulation:  Reviewed for med. necessity/level of care/duration of stay  If discussed at [place] of Stay Meetings, dates discussed:    Comments:

## 2013-11-08 NOTE — Progress Notes (Signed)
Patient was discharged home by MD order; discharged instructions review and give to patient with care notes and prescriptions; IV DIC; skin intact; patient will be escorted to the car by nurse tech via wheelchair.  

## 2013-11-12 ENCOUNTER — Inpatient Hospital Stay: Payer: Medicare Other | Admitting: Pulmonary Disease

## 2013-11-12 LAB — CULTURE, BLOOD (ROUTINE X 2)
Culture: NO GROWTH
Culture: NO GROWTH

## 2013-11-16 ENCOUNTER — Telehealth: Payer: Self-pay | Admitting: *Deleted

## 2013-11-16 NOTE — Telephone Encounter (Signed)
sw pt informed her that 02/18/14 is a holiday. gv appts for 02/19/14 w/ labs@ 10am, ov@ 10:30am, and tx @ 11:30am. Pt is aware...td

## 2013-11-16 NOTE — Telephone Encounter (Signed)
Moved appt from 5/25 to 5/26 due to holiday

## 2013-11-20 ENCOUNTER — Inpatient Hospital Stay: Payer: Medicare Other | Admitting: Pulmonary Disease

## 2013-11-30 ENCOUNTER — Encounter: Payer: Self-pay | Admitting: Neurology

## 2013-11-30 ENCOUNTER — Ambulatory Visit (INDEPENDENT_AMBULATORY_CARE_PROVIDER_SITE_OTHER): Payer: Medicare Other | Admitting: Neurology

## 2013-11-30 VITALS — BP 112/68 | HR 70 | Temp 98.0°F | Resp 18 | Wt 126.3 lb

## 2013-11-30 DIAGNOSIS — G43709 Chronic migraine without aura, not intractable, without status migrainosus: Secondary | ICD-10-CM

## 2013-11-30 DIAGNOSIS — I2510 Atherosclerotic heart disease of native coronary artery without angina pectoris: Secondary | ICD-10-CM

## 2013-11-30 MED ORDER — BUTALBITAL-APAP-CAFFEINE 50-325-40 MG PO TABS: ORAL_TABLET | ORAL | Status: DC

## 2013-11-30 NOTE — Progress Notes (Signed)
NEUROLOGY FOLLOW UP OFFICE NOTE  [name] [id number]  HISTORY OF PRESENT ILLNESS: [name] is a 68 year old right-handed woman with history of breast cancer, hypertension, CAD, COPD who follows up for transformed migraine.  Records and images were personally reviewed where available.    UPDATE: Frequency reduced but duration is longer.  Excedrin does not help. Duration:  2-3 days Frequency:  3-4 times a month Current abortive therapy: Excedrin Current preventative therapy:  Pamelor 50mg  daily  HISTORY: Onset: 20s Location:  Back of head radiating up to the front Quality:  pounding Intensity:  5/10 Aura:  no Associated symptoms:  Used to have severe nausea, vomiting, dizziness, photophobia and phonophobia up until 1993 when she started Pamelor.  Now, no associated symptoms. Duration:  One hour without medication (40 minutes with medication) Frequency:  Three times a week Triggers:  stress Relieving factors:  medication Activity:  Able to perform daily activities  Past abortive therapy:  Fioricet almost daily, fioricet with codeine (was taking it once a week), Midrin (made her feel numb), Advil (not effective), Tylenol (not effective), Vicodin (once, it helped) Past preventative therapy:  none  Caffeine:  3 cups daily Smoking:  yes Sleep hygiene:  Sometimes good, usually well-rested Stress/depression:  stress Family history of headache:  Mother, sisters, daughter.  She was admitted to the hospital from 06/19/13 to 06/25/13 for acute encephalopathy and acute respiratory failure, due to polysubstance overdose that complicated her underlying COPD and pulmonary hypertension.  Tox screen was positive for barbiturates, opioids, and benzodiazepines.  She was also in acute liver failure, likely due to Lipitor or acetaminophen overuse.  AST 946 and ALT 2872 on 06/22/13.  Ammonia on 06/19/13 was 91.  On admission, she was hypoglycemic in setting of acute liver failure.  She was  also in acute kidney insufficiency.  On 06/21/13, Na 141, K 3.9, CO2 24, glucose 164, BUN 43, Cr 1.84.  CT of head on 06/19/13 was reportedly unremarkable.  LFTs normalized by discharge.  EKG (08/09/13): NSR, QT/QTc 350/425.  PAST MEDICAL HISTORY: Past Medical History  Diagnosis Date  . Angina   . Heart murmur   . Depressed   . Coronary artery disease 2006    2 stents w/previous MI  . Hypertension   . Migraine   . Anxiety   . Myocardial infarction 2006    NSTEMI  . COPD (chronic obstructive pulmonary disease)     oxygen-dependent 4LPM [place]  . Moderate to severe pulmonary hypertension   . Diastolic CHF   . Cancer of breast dx'd 2008    RT Breast  . Aortic stenosis     mild  . Dyslipidemia   . NSVT (nonsustained ventricular tachycardia)     h/o  . History of nuclear stress test 08/03/2011    attenuation at apex - no perfusion defects     MEDICATIONS: Current Outpatient Prescriptions on File Prior to Visit  Medication Sig Dispense Refill  . albuterol (PROVENTIL HFA;VENTOLIN HFA) 108 (90 BASE) MCG/ACT inhaler Inhale 2 puffs into the lungs every 4 (four) hours as needed for shortness of breath. Shortness of breath and wheezing  Patient is using Pro-Air brand      . albuterol (PROVENTIL) (5 MG/ML) 0.5% nebulizer solution Take 2.5 mg by nebulization every 2 (two) hours as needed for wheezing or shortness of breath.      . ALPRAZolam (XANAX) 1 MG tablet Take 1 mg by mouth 3 (three) times daily as needed for sleep or  anxiety.      Marland Kitchen anastrozole (ARIMIDEX) 1 MG tablet Take 1 mg by mouth at bedtime.       Marland Kitchen aspirin 325 MG EC tablet Take 325 mg by mouth daily.      . bisacodyl (DULCOLAX) 5 MG EC tablet Take 10 mg by mouth 2 (two) times daily as needed (for constipation).      . budesonide-formoterol (SYMBICORT) 160-4.5 MCG/ACT inhaler Inhale 2 puffs into the lungs 2 (two) times daily.      . calcium-vitamin D (OSCAL WITH D) 250-125 MG-UNIT per tablet Take 1 tablet by mouth daily.      .  diclofenac sodium (VOLTAREN) 1 % GEL Apply 1 application topically daily as needed (apply to hands). Hand pain      . furosemide (LASIX) 40 MG tablet Take 40 mg by mouth 2 (two) times daily.       Marland Kitchen ipratropium (ATROVENT HFA) 17 MCG/ACT inhaler Inhale 2 puffs into the lungs every 4 (four) hours as needed (for shortness of breath).      . lactulose (CHRONULAC) 10 GM/15ML solution Take 20 g by mouth 2 (two) times daily as needed (constipation).      Marland Kitchen levofloxacin (LEVAQUIN) 750 MG tablet Take 1 tablet (750 mg total) by mouth daily.  4 tablet  0  . montelukast (SINGULAIR) 10 MG tablet Take 10 mg by mouth at bedtime.       . Multiple Vitamins-Minerals (MULTIVITAMIN WITH MINERALS) tablet Take 1 tablet by mouth daily.      . nortriptyline (PAMELOR) 25 MG capsule Take 2 capsules (50 mg total) by mouth at bedtime.  60 capsule  3  . Omega 3 1200 MG CAPS Take 1,200 mg by mouth daily.       . ondansetron (ZOFRAN) 8 MG tablet Take 8 mg by mouth 2 (two) times daily as needed for nausea or vomiting.      . OXYGEN-HELIUM IN Inhale 3 L into the lungs as needed.      . pantoprazole (PROTONIX) 40 MG tablet Take 40 mg by mouth daily.      . potassium chloride SA (K-DUR,KLOR-CON) 20 MEQ tablet Take 20 mEq by mouth daily.      . predniSONE (DELTASONE) 10 MG tablet Take 4 tablets (40 mg) daily for 2 days, then, Take 3 tablets (30 mg) daily for 2 days, then, Take 2 tablets (20 mg) daily for 2 days, then, Take 1 tablets (10 mg) daily for 1 days, then stop  19 tablet  0  . Zoledronic Acid (ZOMETA IV) Inject 4 mg into the vein every 6 (six) months.        No current facility-administered medications on file prior to visit.    ALLERGIES: Allergies  Allergen Reactions  . Ceftriaxone Other (See Comments)    "Blow up like a balloon"    FAMILY HISTORY: Family History  Problem Relation Age of Onset  . Schizophrenia Sister   . Alzheimer's disease Mother   . Hyperlipidemia Brother   . Heart disease Sister   .  Diabetes Sister     SOCIAL HISTORY: History   Social History  . Marital Status: Widowed    Spouse Name: N/A    Number of Children: 3  . Years of Education: 12   Occupational History  . Not on file.   Social History Main Topics  . Smoking status: Current Every Day Smoker -- 0.25 packs/day for 47 years    Types: Cigarettes  . Smokeless  tobacco: Never Used     Comment: "once in a blue moon"  . Alcohol Use: Yes     Comment: wine with dinner occasionally  . Drug Use: No  . Sexual Activity: Not Currently     Comment: 3 cig a day   Other Topics Concern  . Not on file   Social History Narrative  . No narrative on file    REVIEW OF SYSTEMS: Constitutional: No fevers, chills, or sweats, no generalized fatigue, change in appetite Eyes: No visual changes, double vision, eye pain Ear, nose and throat: No hearing loss, ear pain, nasal congestion, sore throat Cardiovascular: No chest pain, palpitations Respiratory:  Shortness of breath GastrointestinaI: No nausea, vomiting, diarrhea, abdominal pain, fecal incontinence Genitourinary:  No dysuria, urinary retention or frequency Musculoskeletal:  No neck pain, back pain Integumentary: No rash, pruritus, skin lesions Neurological: as above Psychiatric: anxiety Endocrine: No palpitations, fatigue, diaphoresis, mood swings, change in appetite, change in weight, increased thirst Hematologic/Lymphatic:  No anemia, purpura, petechiae. Allergic/Immunologic: no itchy/runny eyes, nasal congestion, recent allergic reactions, rashes  PHYSICAL EXAM: Filed Vitals:   11/30/13 1335  BP: 112/68  Pulse: 70  Temp: 98 F (36.7 C)  Resp: 18   General: No acute distress Head:  Normocephalic/atraumatic Neck: supple, no paraspinal tenderness, full range of motion Heart:  Regular rate and rhythm Lungs:  Clear to auscultation bilaterally Back: No paraspinal tenderness Neurological Exam: alert and oriented to person, place, and time. Speech  fluent and not dysarthric, language intact.  CN II-XII intact. Fundoscopic exam unremarkable, no papilledema.  Bulk and tone normal, muscle strength 5/5 throughout.  Sensation to light touch intact.  Deep tendon reflexes 2+ throughout.  Finger to nose intact.  Gait normal.  IMPRESSION: Transformed migraines  PLAN: 1.  Does not wish to increase Pamelor.  Continue 50mg  at bedtime 2.  For abortive therapy, will provide Fioricet, since options are limited.  Only prescribed 10 tablets for a month with no refills.  Not to take more than 2 days out of the week. 3.  Stop caffeine 4.  Follow up in 3 months.  Metta Clines, DO

## 2013-11-30 NOTE — Patient Instructions (Signed)
1.  Continue pamelor daily 2.  For acute headaches, take Fioricet but only 1 to 2 pills.  Take no more than one day out of the week.   3.  Follow up in 3 months.

## 2013-12-03 ENCOUNTER — Ambulatory Visit (INDEPENDENT_AMBULATORY_CARE_PROVIDER_SITE_OTHER): Payer: Medicare Other | Admitting: Pulmonary Disease

## 2013-12-03 ENCOUNTER — Ambulatory Visit (INDEPENDENT_AMBULATORY_CARE_PROVIDER_SITE_OTHER)
Admission: RE | Admit: 2013-12-03 | Discharge: 2013-12-03 | Disposition: A | Discharge status: Home / Self Care | Payer: Medicare Other | Source: Ambulatory Visit | Attending: Pulmonary Disease | Admitting: Pulmonary Disease

## 2013-12-03 ENCOUNTER — Encounter: Payer: Self-pay | Admitting: Pulmonary Disease

## 2013-12-03 VITALS — BP 116/66 | HR 86 | Ht 62.0 in | Wt 125.0 lb

## 2013-12-03 DIAGNOSIS — F411 Generalized anxiety disorder: Secondary | ICD-10-CM

## 2013-12-03 DIAGNOSIS — J9610 Chronic respiratory failure, unspecified whether with hypoxia or hypercapnia: Secondary | ICD-10-CM

## 2013-12-03 DIAGNOSIS — J189 Pneumonia, unspecified organism: Secondary | ICD-10-CM

## 2013-12-03 DIAGNOSIS — J449 Chronic obstructive pulmonary disease, unspecified: Secondary | ICD-10-CM

## 2013-12-03 DIAGNOSIS — F17200 Nicotine dependence, unspecified, uncomplicated: Secondary | ICD-10-CM

## 2013-12-03 DIAGNOSIS — I2510 Atherosclerotic heart disease of native coronary artery without angina pectoris: Secondary | ICD-10-CM

## 2013-12-03 DIAGNOSIS — Z720 Tobacco use: Secondary | ICD-10-CM

## 2013-12-03 HISTORY — DX: Generalized anxiety disorder: F41.1

## 2013-12-03 MED ORDER — HYDROXYZINE PAMOATE 25 MG PO CAPS: 25.0000 mg | ORAL_CAPSULE | Freq: Three times a day (TID) | ORAL | Status: DC | PRN

## 2013-12-03 MED ORDER — BUDESONIDE-FORMOTEROL FUMARATE 160-4.5 MCG/ACT IN AERO: 2.0000 | INHALATION_SPRAY | Freq: Two times a day (BID) | RESPIRATORY_TRACT | Status: DC

## 2013-12-03 NOTE — Assessment & Plan Note (Signed)
She has recovered well from her recent episode of CAP. She needs a CXR today to make sure the CAP has cleared up.

## 2013-12-03 NOTE — Assessment & Plan Note (Signed)
Counseled to quit today.  I think she uses tobacco to treat her severe anxiety

## 2013-12-03 NOTE — Progress Notes (Signed)
Subjective:    Patient ID: [name], female    DOB: 09-Apr-1946, 68 y.o.   MRN: [id number]  Synopsis: [name] has COPD and requires 3L [place] at rest and is typically non-compliant with her oxygen therapy. She has never had PFTs.  We were worried about pulmonary hypertension in 2014 so we performed a RHC which showed normal pulmonary pressures.    HPI   10/03/2013 ROV >> She tells me that she is still not using oxygen a lot during the day when she is at rest.  She has only been using it when she exerts herself.  She has gained a few pounds since the last visit.  She has not had too much dyspnea since the last visit.  She is frustrated because she has not been given Xanax from her primary care physician.  She has minimal cough.    12/03/2013 Hospital Follow Up> Ms. Vanwey is her to see me today since she was recently hospitalized for about three days for pneumonia  She completed the course of antibiotics that was prescribed and now she feels better . Her cough has improved and overall she is doing really well.  She continues to use her oxygen. She spent most of the visit today asking for xanax.  She uses albuterol nebulized twice   Past Medical History  Diagnosis Date  . Angina   . Heart murmur   . Depressed   . Coronary artery disease 2006    2 stents w/previous MI  . Hypertension   . Migraine   . Anxiety   . Myocardial infarction 2006    NSTEMI  . COPD (chronic obstructive pulmonary disease)     oxygen-dependent 4LPM [place]  . Moderate to severe pulmonary hypertension   . Diastolic CHF   . Cancer of breast dx'd 2008    RT Breast  . Aortic stenosis     mild  . Dyslipidemia   . NSVT (nonsustained ventricular tachycardia)     h/o  . History of nuclear stress test 08/03/2011    attenuation at apex - no perfusion defects      Review of Systems  Constitutional: Negative for fever and chills.  HENT: Negative for postnasal drip, rhinorrhea and sinus pressure.   Respiratory: Positive  for shortness of breath. Negative for cough and wheezing.   Cardiovascular: Negative for chest pain, palpitations and leg swelling.  Psychiatric/Behavioral: The patient is nervous/anxious.        Objective:   Physical Exam  Filed Vitals:   12/03/13 1328  BP: 116/66  Pulse: 86  Height: 5\' 2"  (1.575 m)  Weight: 125 lb (56.7 kg)  SpO2: 100%   3L [place]  Gen: no distress HEENT: NCAT, EOMi PULM: Few crackles in lower lobes, otherwise clear CV: RRR, no mgr AB: bS+, soft, nontender Ext: warm, acyanotic, no edema      Assessment & Plan:   No problem-specific assessment & plan notes found for this encounter.   Updated Medication List Outpatient Encounter Prescriptions as of 12/03/2013  Medication Sig  . albuterol (PROVENTIL HFA;VENTOLIN HFA) 108 (90 BASE) MCG/ACT inhaler Inhale 2 puffs into the lungs every 4 (four) hours as needed for shortness of breath. Shortness of breath and wheezing  Patient is using Pro-Air brand  . albuterol (PROVENTIL) (5 MG/ML) 0.5% nebulizer solution Take 2.5 mg by nebulization every 2 (two) hours as needed for wheezing or shortness of breath.  . ALPRAZolam (XANAX) 1 MG tablet Take 1 mg by mouth 3 (  three) times daily as needed for sleep or anxiety.  Marland Kitchen anastrozole (ARIMIDEX) 1 MG tablet Take 1 mg by mouth at bedtime.   Marland Kitchen aspirin 325 MG EC tablet Take 325 mg by mouth daily.  . bisacodyl (DULCOLAX) 5 MG EC tablet Take 10 mg by mouth 2 (two) times daily as needed (for constipation).  . budesonide-formoterol (SYMBICORT) 160-4.5 MCG/ACT inhaler Inhale 2 puffs into the lungs 2 (two) times daily.  . butalbital-acetaminophen-caffeine (FIORICET) 50-325-40 MG per tablet Take 1-2 pills as needed for headache  . calcium-vitamin D (OSCAL WITH D) 250-125 MG-UNIT per tablet Take 1 tablet by mouth daily.  . diclofenac sodium (VOLTAREN) 1 % GEL Apply 1 application topically daily as needed (apply to hands). Hand pain  . furosemide (LASIX) 40 MG tablet Take 40 mg by mouth 2  (two) times daily.   Marland Kitchen ipratropium (ATROVENT HFA) 17 MCG/ACT inhaler Inhale 2 puffs into the lungs every 4 (four) hours as needed (for shortness of breath).  . lactulose (CHRONULAC) 10 GM/15ML solution Take 20 g by mouth 2 (two) times daily as needed (constipation).  . montelukast (SINGULAIR) 10 MG tablet Take 10 mg by mouth at bedtime.   . Multiple Vitamins-Minerals (MULTIVITAMIN WITH MINERALS) tablet Take 1 tablet by mouth daily.  . nortriptyline (PAMELOR) 25 MG capsule Take 2 capsules (50 mg total) by mouth at bedtime.  . Omega 3 1200 MG CAPS Take 1,200 mg by mouth daily.   . ondansetron (ZOFRAN) 8 MG tablet Take 8 mg by mouth 2 (two) times daily as needed for nausea or vomiting.  . OXYGEN-HELIUM IN Inhale 3 L into the lungs as needed.  . pantoprazole (PROTONIX) 40 MG tablet Take 40 mg by mouth daily.  . potassium chloride SA (K-DUR,KLOR-CON) 20 MEQ tablet Take 20 mEq by mouth daily.  . Zoledronic Acid (ZOMETA IV) Inject 4 mg into the vein every 6 (six) months.   . [DISCONTINUED] levofloxacin (LEVAQUIN) 750 MG tablet Take 1 tablet (750 mg total) by mouth daily.  . [DISCONTINUED] predniSONE (DELTASONE) 10 MG tablet Take 4 tablets (40 mg) daily for 2 days, then, Take 3 tablets (30 mg) daily for 2 days, then, Take 2 tablets (20 mg) daily for 2 days, then, Take 1 tablets (10 mg) daily for 1 days, then stop

## 2013-12-03 NOTE — Assessment & Plan Note (Signed)
We spent the majority of today's visit talking about her anxiety as she is convinced that she needs a benzodiazepine for this.  I don't think anyone had taken the time to explain the side effects of these until I did today.  After we talked about dependence, delirium risk, and perhaps long term dementia risk she was less enthusiastic.  However, she remains quite symptomatic.  I gave a prescription for hydroxizine to take until she establishes care with her new PCP next week.

## 2013-12-03 NOTE — Patient Instructions (Signed)
Quit smoking Keep using your oxygen and your symbicort as you are doing Use the vistaril as needed for anxiety until you see the primary care physician next week We will see you back as previously scheduled

## 2013-12-03 NOTE — Assessment & Plan Note (Signed)
She has recovered from her pneumonia well. She desperately needs to quit smoking.  Plan: -continue symbicort -quit smoking

## 2013-12-03 NOTE — Assessment & Plan Note (Signed)
She continues to use and benefit from 3L O2 continuously.

## 2013-12-04 ENCOUNTER — Telehealth: Payer: Self-pay | Admitting: *Deleted

## 2013-12-04 ENCOUNTER — Telehealth: Payer: Self-pay

## 2013-12-04 NOTE — Telephone Encounter (Signed)
[name], with [facility], phoned regarding patient's PA for hydroxine.   CB# [phone]

## 2013-12-04 NOTE — Telephone Encounter (Signed)
Message copied by Len Blalock on Tue Dec 04, 2013 10:42 AM ------      Message from: Simonne Maffucci B      Created: Mon Dec 03, 2013  7:20 PM       A,      Please let her know this was normal      Thanks      b ------

## 2013-12-04 NOTE — Telephone Encounter (Signed)
This medication was not prescribed by [place].  Please forward to Dr Anastasia Pall office.

## 2013-12-04 NOTE — Telephone Encounter (Signed)
Pt aware of cxr results.  Nothing further needed. 

## 2013-12-04 NOTE — Telephone Encounter (Signed)
Please review prior documentation

## 2013-12-05 ENCOUNTER — Telehealth: Payer: Self-pay

## 2013-12-05 NOTE — Telephone Encounter (Signed)
She chose to change PCP's over her anxiety meds I tried to help her by providing a week of hydroxyzine first Tell her that her insurance did not approve it and her choices are to either pay for it out of pocket or wait until next week

## 2013-12-05 NOTE — Telephone Encounter (Signed)
Pt picked up med and paid out of pocket, stated it made her dizzy and will not take it again.  She is going to the [place] on Tuesday and will get her anxiety meds sorted out then.  Nothing further needed at this time.

## 2013-12-05 NOTE — Telephone Encounter (Signed)
Pt's insurance will not cover Hydroxyzine without a failed attempt at tx with at least 2 of the following meds: -buspirone -fluoxetine -citalopram -venlafaxine  I don't see hx of any of these above meds in her med hx list.  Is there something we can try instead that her insurance will cover?  Thanks, Caryl Pina

## 2013-12-06 NOTE — Telephone Encounter (Signed)
Pt will discuss her anxiety meds with her prescriber at the [facility] [place].  Nothing further needed at this time.

## 2013-12-11 ENCOUNTER — Ambulatory Visit (INDEPENDENT_AMBULATORY_CARE_PROVIDER_SITE_OTHER): Payer: Medicare Other | Admitting: Physician Assistant

## 2013-12-11 ENCOUNTER — Encounter: Payer: Self-pay | Admitting: Physician Assistant

## 2013-12-11 VITALS — BP 112/68 | HR 87 | Temp 98.4°F | Ht 62.0 in | Wt 125.8 lb

## 2013-12-11 DIAGNOSIS — I509 Heart failure, unspecified: Secondary | ICD-10-CM

## 2013-12-11 DIAGNOSIS — E785 Hyperlipidemia, unspecified: Secondary | ICD-10-CM

## 2013-12-11 DIAGNOSIS — J449 Chronic obstructive pulmonary disease, unspecified: Secondary | ICD-10-CM

## 2013-12-11 DIAGNOSIS — G43909 Migraine, unspecified, not intractable, without status migrainosus: Secondary | ICD-10-CM

## 2013-12-11 DIAGNOSIS — M899 Disorder of bone, unspecified: Secondary | ICD-10-CM

## 2013-12-11 DIAGNOSIS — F411 Generalized anxiety disorder: Secondary | ICD-10-CM

## 2013-12-11 DIAGNOSIS — F17200 Nicotine dependence, unspecified, uncomplicated: Secondary | ICD-10-CM

## 2013-12-11 DIAGNOSIS — I10 Essential (primary) hypertension: Secondary | ICD-10-CM

## 2013-12-11 DIAGNOSIS — Z0000 Encounter for general adult medical examination without abnormal findings: Secondary | ICD-10-CM

## 2013-12-11 DIAGNOSIS — M949 Disorder of cartilage, unspecified: Secondary | ICD-10-CM

## 2013-12-11 DIAGNOSIS — M8580 Other specified disorders of bone density and structure, unspecified site: Secondary | ICD-10-CM

## 2013-12-11 DIAGNOSIS — F419 Anxiety disorder, unspecified: Secondary | ICD-10-CM

## 2013-12-11 MED ORDER — ALPRAZOLAM 1 MG PO TABS: 1.0000 mg | ORAL_TABLET | Freq: Three times a day (TID) | ORAL | Status: DC | PRN

## 2013-12-11 NOTE — Progress Notes (Signed)
Pre visit review using our clinic review tool, if applicable. No additional management support is needed unless otherwise documented below in the visit note. 

## 2013-12-11 NOTE — Progress Notes (Signed)
Patient ID: [name] is a 68 y.o. female DOB: [phone] MRN: [id number]     HPI:  Patient is a 68 year old female who presents to the office to establish care. Patient has an extensive list of medical conditions as stated in her medical history. Is current on all her medications and reports takes them as prescribed. Reports she sees Dr. Lake Bells, pulmonologist, for COPD and pulmonary hypertension. Wears O2 set at 3L mostly when at rest and when sleeping. Sees cardiology for her coronary artery disease and CHF. Has seen Dr. Dellis Filbert, neurologist, for migraines. States she has a GYN and is current on mammogram and PAP. Has no concerns at this time. Would like refills for her Fioricet and Xanax. Denies chest pain/palpitations, new cough or changes in her shortness of breath, change in bowel/bladder habits, blood in urine or stool, visual change/disturbances, N/V/F, abdominal pain, lightheaded, dizzy or weakness.   Influenza: 9/14 Pneumonia: 7/00 Tetanus: uncertain PAP: 1749 LMP: postmenopausal Mammogram: 2014 Eye Dr. no Dentist: dentures Colonoscopy: no   ROS: As stated in HPI. All other systems negative  Past Medical History  Diagnosis Date  . Angina   . Heart murmur   . Depressed   . Coronary artery disease 2006    2 stents w/previous MI  . Hypertension   . Migraine   . Anxiety   . Myocardial infarction 2006    NSTEMI  . COPD (chronic obstructive pulmonary disease)     oxygen-dependent 4LPM [place]  . Moderate to severe pulmonary hypertension   . Diastolic CHF   . Cancer of breast dx'd 2008    RT Breast  . Aortic stenosis     mild  . Dyslipidemia   . NSVT (nonsustained ventricular tachycardia)     h/o  . History of nuclear stress test 08/03/2011    attenuation at apex - no perfusion defects   . Abnormal LFTs 08/02/2011  . Acute liver failure 06/19/2013  . Acute renal failure 06/19/2013  . Acute respiratory failure 06/19/2013  . Altered mental status 08/01/2011  . Anxiety  state, unspecified 12/03/2013  . Breast cancer of upper-outer quadrant of left female breast 08/21/2013  . CAD (coronary artery disease) of artery bypass graft 11/06/2013  . CAD (coronary artery disease), MI R/O 08/01/2011    PCI in 2006 (bare metal stent, unknown artery) - [place]   . CHF,  acute diastolic, BNP 4k on admissio 08/01/2011  . Chronic respiratory failure 02/02/2012  . Compression fracture of L1 lumbar vertebra 02/02/2012  . Mild aortic stenosis 08/01/2011    AVA 1.69 cm2 (06/02/2011)   . Hyponatremia 01/31/2012  . Hyperkalemia, on ACE prior to admission 11/11/2011  . HTN (hypertension) 11/06/2013  . History of breast cancer in female 06/23/2013  . 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 Schizophrenia Sister   . Alzheimer's disease Mother   . Hyperlipidemia Brother   . Heart disease Sister   . Diabetes Sister    History   Social History  . Marital Status: Widowed    Spouse Name: N/A    Number of Children: 3  . Years of Education: 12   Social History Main Topics  . Smoking status: Current Some Day Smoker -- 0.25 packs/day for 47 years    Types: Cigarettes  . Smokeless tobacco: Never Used     Comment: smokes when anxious  . Alcohol Use: Yes     Comment: wine with  dinner occasionally  . Drug Use: No  . Sexual Activity: Not Currently     Comment: 3 cig a day   Other Topics Concern  . None   Social History Narrative  . None   Past Surgical History  Procedure Laterality Date  . Breast lumpectomy Right 2008  . Cardiac catheterization  2006  . Cholecystectomy  2011  . Abdominal surgery  2009  . Transthoracic echocardiogram  11/12/2011    EF 0000000, normal systolic function, grade 1 diastolic dysfunction; ventricular septal flattening (D-sign); mild AS; trace-mild MR; LA mildly dilated; RV mod dilated; RA mod dilated; severe pulm HTN; elevated CVP   Current Outpatient Prescriptions on File Prior to Visit  Medication Sig Dispense  Refill  . albuterol (PROVENTIL HFA;VENTOLIN HFA) 108 (90 BASE) MCG/ACT inhaler Inhale 2 puffs into the lungs every 4 (four) hours as needed for shortness of breath. Shortness of breath and wheezing  Patient is using Pro-Air brand      . albuterol (PROVENTIL) (5 MG/ML) 0.5% nebulizer solution Take 2.5 mg by nebulization every 2 (two) hours as needed for wheezing or shortness of breath.      . anastrozole (ARIMIDEX) 1 MG tablet Take 1 mg by mouth at bedtime.       Marland Kitchen aspirin 325 MG EC tablet Take 325 mg by mouth daily.      . bisacodyl (DULCOLAX) 5 MG EC tablet Take 10 mg by mouth 2 (two) times daily as needed (for constipation).      . budesonide-formoterol (SYMBICORT) 160-4.5 MCG/ACT inhaler Inhale 2 puffs into the lungs 2 (two) times daily.  3 Inhaler  3  . butalbital-acetaminophen-caffeine (FIORICET) 50-325-40 MG per tablet Take 1-2 pills as needed for headache  10 tablet  0  . calcium-vitamin D (OSCAL WITH D) 250-125 MG-UNIT per tablet Take 1 tablet by mouth daily.      . diclofenac sodium (VOLTAREN) 1 % GEL Apply 1 application topically daily as needed (apply to hands). Hand pain      . furosemide (LASIX) 40 MG tablet Take 40 mg by mouth 2 (two) times daily.       Marland Kitchen ipratropium (ATROVENT HFA) 17 MCG/ACT inhaler Inhale 2 puffs into the lungs every 4 (four) hours as needed (for shortness of breath).      . lactulose (CHRONULAC) 10 GM/15ML solution Take 20 g by mouth 2 (two) times daily as needed (constipation).      . montelukast (SINGULAIR) 10 MG tablet Take 10 mg by mouth at bedtime.       . Multiple Vitamins-Minerals (MULTIVITAMIN WITH MINERALS) tablet Take 1 tablet by mouth daily.      . nortriptyline (PAMELOR) 25 MG capsule Take 2 capsules (50 mg total) by mouth at bedtime.  60 capsule  3  . Omega 3 1200 MG CAPS Take 1,200 mg by mouth daily.       . ondansetron (ZOFRAN) 8 MG tablet Take 8 mg by mouth 2 (two) times daily as needed for nausea or vomiting.      . OXYGEN-HELIUM IN Inhale 3 L into  the lungs as needed.      . pantoprazole (PROTONIX) 40 MG tablet Take 40 mg by mouth daily.      . potassium chloride SA (K-DUR,KLOR-CON) 20 MEQ tablet Take 20 mEq by mouth daily.      . Zoledronic Acid (ZOMETA IV) Inject 4 mg into the vein every 6 (six) months.        No current facility-administered  medications on file prior to visit.   Allergies  Allergen Reactions  . Hydroxyzine Shortness Of Breath and Other (See Comments)    Pt states med make her light headed, get sob sxs  . Ceftriaxone Other (See Comments)    "Blow up like a balloon"  . Lexapro [Escitalopram] Other (See Comments)    Pt states med make her dizzy    PE:  Filed Vitals:   12/11/13 1312  BP: 112/68  Pulse: 87  Temp: 98.4 F (36.9 C)    CONSTITUTIONAL: Well developed, well nourished, pleasant, appears stated age, in NAD, seated comfortably on chair, wearing nasal canula.  HEENT: normocephalic, atraumatic, bilateral ext/int canals normal. Bilateral TM's without injections, bulging, erythema. Nose normal, uvula midline, oropharynx clear and moist. EYES: PERRLA, bilateral EOM and conjunctiva normal NECK: FROM, supple, without thyromegaly or mass, no carotid bruits noted CARDIO: RRR, normal S1 and S2, distal pulses intact. PULM/CHEST CTA bilateral, no wheezes, rales or rhonchi. Non tender. ABD: appearance normal, soft, nontender. Normal bowel sounds x 4 quadrants, nonpalpable liver, kidney, spleen GU: deferred to GYN MUSC: FROM U/LE bilateral, FROM thoracic and lumbar spine, no midline tenderness noted. LYMPH: no cervical, supraclavicular adenopathy NEURO: alert and oriented x 3, no cranial nerve deficit, motor strength and coordination NL.DTR's intact. Negative romberg. Gait normal. SKIN: warm, dry, no rash or lesions noted. PSYCH: Mood and affect normal, speech normal.   Lab Results  Component Value Date   WBC 15.9* 11/08/2013   HGB 12.1 11/08/2013   HCT 36.7 11/08/2013   PLT 276 11/08/2013   GLUCOSE 98  11/08/2013   CHOL 83 11/11/2011   TRIG 80 11/11/2011   HDL 40 11/11/2011   LDLCALC 27 11/11/2011   ALT 27 08/21/2013   AST 21 08/21/2013   NA 138 11/08/2013   K 4.9 11/08/2013   CL 98 11/08/2013   CREATININE 1.00 11/08/2013   BUN 28* 11/08/2013   CO2 29 11/08/2013   TSH 0.521 11/11/2011   INR 0.87 09/05/2013    Wt Readings from Last 3 Encounters:  12/11/13 125 lb 12.8 oz (57.063 kg)  12/03/13 125 lb (56.7 kg)  11/30/13 126 lb 4.8 oz (57.289 kg)    ASSESSMENT and PLAN   CPX/v70.0 - Patient has been counseled on age-appropriate routine health concerns for screening and prevention. These are reviewed and up-to-date. Immunizations are up-to-date or declined. Labs ordered and will be reviewed.   COPD/CHF Continue with current medications  Keep appointment as scheduled with pulmonology  Anxiety: Treated with Alprazolam 1 mg tid  HTN: Continue with Lasix 40 mg twice daily  Hyperlipidemia: Not currently on medications  Migraine: Continue with current medications  Osteopenia: Zometia 4 mg every 6 months  Smoker Patient not motivated to quit at this time.

## 2013-12-11 NOTE — Patient Instructions (Signed)
It was great to meet you today [name]!  Labs have been ordered for you, when you report to lab please be fasting.     Generalized Anxiety Disorder Generalized anxiety disorder (GAD) is a mental disorder. It interferes with life functions, including relationships, work, and school. GAD is different from normal anxiety, which everyone experiences at some point in their lives in response to specific life events and activities. Normal anxiety actually helps [place] prepare for and get through these life events and activities. Normal anxiety goes away after the event or activity is over.  GAD causes anxiety that is not necessarily related to specific events or activities. It also causes excess anxiety in proportion to specific events or activities. The anxiety associated with GAD is also difficult to control. GAD can vary from mild to severe. People with severe GAD can have intense waves of anxiety with physical symptoms (panic attacks).  SYMPTOMS The anxiety and worry associated with GAD are difficult to control. This anxiety and worry are related to many life events and activities and also occur more days than not for 6 months or longer. People with GAD also have three or more of the following symptoms (one or more in children):  Restlessness.   Fatigue.  Difficulty concentrating.   Irritability.  Muscle tension.  Difficulty sleeping or unsatisfying sleep. DIAGNOSIS GAD is diagnosed through an assessment by your caregiver. Your caregiver will ask you questions aboutyour mood,physical symptoms, and events in your life. Your caregiver may ask you about your medical history and use of alcohol or drugs, including prescription medications. Your caregiver may also do a physical exam and blood tests. Certain medical conditions and the use of certain substances can cause symptoms similar to those associated with GAD. Your caregiver may refer you to a mental health specialist for further  evaluation. TREATMENT The following therapies are usually used to treat GAD:   Medication Antidepressant medication usually is prescribed for long-term daily control. Antianxiety medications may be added in severe cases, especially when panic attacks occur.   Talk therapy (psychotherapy) Certain types of talk therapy can be helpful in treating GAD by providing support, education, and guidance. A form of talk therapy called cognitive behavioral therapy can teach you healthy ways to think about and react to daily life events and activities.  Stress managementtechniques These include yoga, meditation, and exercise and can be very helpful when they are practiced regularly. A mental health specialist can help determine which treatment is best for you. Some people see improvement with one therapy. However, other people require a combination of therapies. Document Released: 01/08/2013 Document Reviewed: 01/08/2013 [facility] Patient Information 2014 [place], [place].   Health Maintenance, Female A healthy lifestyle and preventative care can promote health and wellness.  Maintain regular health, dental, and eye exams.  Eat a healthy diet. Foods like vegetables, fruits, whole grains, low-fat dairy products, and lean protein foods contain the nutrients you need without too many calories. Decrease your intake of foods high in solid fats, added sugars, and salt. Get information about a proper diet from your caregiver, if necessary.  Regular physical exercise is one of the most important things you can do for your health. Most adults should get at least 150 minutes of moderate-intensity exercise (any activity that increases your heart rate and causes you to sweat) each week. In addition, most adults need muscle-strengthening exercises on 2 or more days a week.   Maintain a healthy weight. The body mass index (BMI) is a  screening tool to identify possible weight problems. It provides an estimate of body fat  based on height and weight. Your caregiver can help determine your BMI, and can help you achieve or maintain a healthy weight. For adults 20 years and older:  A BMI below 18.5 is considered underweight.  A BMI of 18.5 to 24.9 is normal.  A BMI of 25 to 29.9 is considered overweight.  A BMI of 30 and above is considered obese.  Maintain normal blood lipids and cholesterol by exercising and minimizing your intake of saturated fat. Eat a balanced diet with plenty of fruits and vegetables. Blood tests for lipids and cholesterol should begin at age 77 and be repeated every 5 years. If your lipid or cholesterol levels are high, you are over 50, or you are a high risk for heart disease, you may need your cholesterol levels checked more frequently.Ongoing high lipid and cholesterol levels should be treated with medicines if diet and exercise are not effective.  If you smoke, find out from your caregiver how to quit. If you do not use tobacco, do not start.  Lung cancer screening is recommended for adults aged 14 80 years who are at high risk for developing lung cancer because of a history of smoking. Yearly low-dose computed tomography (CT) is recommended for people who have at least a 30-pack-year history of smoking and are a current smoker or have quit within the past 15 years. A pack year of smoking is smoking an average of 1 pack of cigarettes a day for 1 year (for example: 1 pack a day for 30 years or 2 packs a day for 15 years). Yearly screening should continue until the smoker has stopped smoking for at least 15 years. Yearly screening should also be stopped for people who develop a health problem that would prevent them from having lung cancer treatment.  If you are pregnant, do not drink alcohol. If you are breastfeeding, be very cautious about drinking alcohol. If you are not pregnant and choose to drink alcohol, do not exceed 1 drink per day. One drink is considered to be 12 ounces (355 mL) of  beer, 5 ounces (148 mL) of wine, or 1.5 ounces (44 mL) of liquor.  Avoid use of street drugs. Do not share needles with anyone. Ask for help if you need support or instructions about stopping the use of drugs.  High blood pressure causes heart disease and increases the risk of stroke. Blood pressure should be checked at least every 1 to 2 years. Ongoing high blood pressure should be treated with medicines, if weight loss and exercise are not effective.  If you are 31 to 67 years old, ask your caregiver if you should take aspirin to prevent strokes.  Diabetes screening involves taking a blood sample to check your fasting blood sugar level. This should be done once every 3 years, after age 74, if you are within normal weight and without risk factors for diabetes. Testing should be considered at a younger age or be carried out more frequently if you are overweight and have at least 1 risk factor for diabetes.  Breast cancer screening is essential preventative care for women. You should practice "breast self-awareness." This means understanding the normal appearance and feel of your breasts and may include breast self-examination. Any changes detected, no matter how small, should be reported to a caregiver. Women in their 41s and 30s should have a clinical breast exam (CBE) by a caregiver as part  of a regular health exam every 1 to 3 years. After age 7, women should have a CBE every year. Starting at age 6, women should consider having a mammogram (breast X-ray) every year. Women who have a family history of breast cancer should talk to their caregiver about genetic screening. Women at a high risk of breast cancer should talk to their caregiver about having an MRI and a mammogram every year.  Breast cancer gene (BRCA)-related cancer risk assessment is recommended for women who have family members with BRCA-related cancers. BRCA-related cancers include breast, ovarian, tubal, and peritoneal cancers. Having  family members with these cancers may be associated with an increased risk for harmful changes (mutations) in the breast cancer genes BRCA1 and BRCA2. Results of the assessment will determine the need for genetic counseling and BRCA1 and BRCA2 testing.  The Pap test is a screening test for cervical cancer. Women should have a Pap test starting at age 43. Between ages 58 and 65, Pap tests should be repeated every 2 years. Beginning at age 17, you should have a Pap test every 3 years as long as the past 3 Pap tests have been normal. If you had a hysterectomy for a problem that was not cancer or a condition that could lead to cancer, then you no longer need Pap tests. If you are between ages 49 and 23, and you have had normal Pap tests going back 10 years, you no longer need Pap tests. If you have had past treatment for cervical cancer or a condition that could lead to cancer, you need Pap tests and screening for cancer for at least 20 years after your treatment. If Pap tests have been discontinued, risk factors (such as a new sexual partner) need to be reassessed to determine if screening should be resumed. Some women have medical problems that increase the chance of getting cervical cancer. In these cases, your caregiver may recommend more frequent screening and Pap tests.  The human papillomavirus (HPV) test is an additional test that may be used for cervical cancer screening. The HPV test looks for the virus that can cause the cell changes on the cervix. The cells collected during the Pap test can be tested for HPV. The HPV test could be used to screen women aged 68 years and older, and should be used in women of any age who have unclear Pap test results. After the age of 23, women should have HPV testing at the same frequency as a Pap test.  Colorectal cancer can be detected and often prevented. Most routine colorectal cancer screening begins at the age of 36 and continues through age 44. However, your  caregiver may recommend screening at an earlier age if you have risk factors for colon cancer. On a yearly basis, your caregiver may provide home test kits to check for hidden blood in the stool. Use of a small camera at the end of a tube, to directly examine the colon (sigmoidoscopy or colonoscopy), can detect the earliest forms of colorectal cancer. Talk to your caregiver about this at age 57, when routine screening begins. Direct examination of the colon should be repeated every 5 to 10 years through age 43, unless early forms of pre-cancerous polyps or small growths are found.  Hepatitis C blood testing is recommended for all people born from [birth year] through 1965 and any individual with known risks for hepatitis C.  Practice safe sex. Use condoms and avoid high-risk sexual practices to reduce the spread of  sexually transmitted infections (STIs). Sexually active women aged 93 and younger should be checked for Chlamydia, which is a common sexually transmitted infection. Older women with new or multiple partners should also be tested for Chlamydia. Testing for other STIs is recommended if you are sexually active and at increased risk.  Osteoporosis is a disease in which the bones lose minerals and strength with aging. This can result in serious bone fractures. The risk of osteoporosis can be identified using a bone density scan. Women ages 65 and over and women at risk for fractures or osteoporosis should discuss screening with their caregivers. Ask your caregiver whether you should be taking a calcium supplement or vitamin D to reduce the rate of osteoporosis.  Menopause can be associated with physical symptoms and risks. Hormone replacement therapy is available to decrease symptoms and risks. You should talk to your caregiver about whether hormone replacement therapy is right for you.  Use sunscreen. Apply sunscreen liberally and repeatedly throughout the day. You should seek shade when your shadow is  shorter than you. Protect yourself by wearing long sleeves, pants, a wide-brimmed hat, and sunglasses year round, whenever you are outdoors.  Notify your caregiver of new moles or changes in moles, especially if there is a change in shape or color. Also notify your caregiver if a mole is larger than the size of a pencil eraser.  Stay current with your immunizations. Document Released: 03/29/2011 Document Revised: 01/08/2013 Document Reviewed: 03/29/2011 [facility] Patient Information 2014 [place].

## 2013-12-19 ENCOUNTER — Other Ambulatory Visit (INDEPENDENT_AMBULATORY_CARE_PROVIDER_SITE_OTHER): Payer: Medicare Other

## 2013-12-19 DIAGNOSIS — I509 Heart failure, unspecified: Secondary | ICD-10-CM

## 2013-12-19 DIAGNOSIS — E785 Hyperlipidemia, unspecified: Secondary | ICD-10-CM

## 2013-12-19 DIAGNOSIS — F419 Anxiety disorder, unspecified: Secondary | ICD-10-CM

## 2013-12-19 DIAGNOSIS — J449 Chronic obstructive pulmonary disease, unspecified: Secondary | ICD-10-CM

## 2013-12-19 DIAGNOSIS — F411 Generalized anxiety disorder: Secondary | ICD-10-CM

## 2013-12-19 DIAGNOSIS — I10 Essential (primary) hypertension: Secondary | ICD-10-CM

## 2013-12-19 DIAGNOSIS — Z0000 Encounter for general adult medical examination without abnormal findings: Secondary | ICD-10-CM

## 2013-12-19 LAB — URINALYSIS, ROUTINE W REFLEX MICROSCOPIC
Bilirubin Urine: NEGATIVE
Hgb urine dipstick: NEGATIVE
Ketones, ur: NEGATIVE
Nitrite: NEGATIVE
RBC / HPF: NONE SEEN (ref 0–?)
Specific Gravity, Urine: <=1.005 — AB (ref 1.000–1.030)
Total Protein, Urine: NEGATIVE
Urine Glucose: NEGATIVE
Urobilinogen, UA: 0.2 (ref 0.0–1.0)
pH: 7 (ref 5.0–8.0)

## 2013-12-19 LAB — HEPATIC FUNCTION PANEL
ALT: 18 U/L (ref 0–35)
AST: 23 U/L (ref 0–37)
Albumin: 4.1 g/dL (ref 3.5–5.2)
Alkaline Phosphatase: 61 U/L (ref 39–117)
Bilirubin, Direct: 0.1 mg/dL (ref 0.0–0.3)
Total Bilirubin: 0.6 mg/dL (ref 0.3–1.2)
Total Protein: 7.8 g/dL (ref 6.0–8.3)

## 2013-12-19 LAB — LIPID PANEL
Cholesterol: 269 mg/dL — ABNORMAL HIGH (ref 0–200)
HDL: 77.8 mg/dL (ref >39.00)
LDL Cholesterol: 169 mg/dL — ABNORMAL HIGH (ref 0–99)
Total CHOL/HDL Ratio: 3
Triglycerides: 113 mg/dL (ref 0.0–149.0)
VLDL: 22.6 mg/dL (ref 0.0–40.0)

## 2013-12-19 LAB — TSH: TSH: 1.22 u[IU]/mL (ref 0.35–5.50)

## 2013-12-20 ENCOUNTER — Other Ambulatory Visit: Payer: Self-pay | Admitting: Physician Assistant

## 2013-12-20 DIAGNOSIS — E785 Hyperlipidemia, unspecified: Secondary | ICD-10-CM

## 2013-12-20 MED ORDER — ATORVASTATIN CALCIUM 20 MG PO TABS: 20.0000 mg | ORAL_TABLET | Freq: Every day | ORAL | Status: DC

## 2014-01-07 ENCOUNTER — Other Ambulatory Visit: Payer: Self-pay | Admitting: Physician Assistant

## 2014-01-07 ENCOUNTER — Telehealth: Payer: Self-pay | Admitting: Oncology

## 2014-01-07 NOTE — Telephone Encounter (Signed)
per pof to resch appts/trmt to pm in Sept-sent MW to resch infus

## 2014-01-08 ENCOUNTER — Telehealth: Payer: Self-pay | Admitting: *Deleted

## 2014-01-08 NOTE — Telephone Encounter (Signed)
Per staff message and POF I have scheduled appts.  JMW  

## 2014-01-14 ENCOUNTER — Telehealth: Payer: Self-pay

## 2014-01-14 NOTE — Telephone Encounter (Signed)
The patient called and is in need of a refill of her Arimidex 1mg  and Protonix 40mg .  She states she only has two pills left of her Arimidex.  She is in the process of finding a new primary care provider, but was hoping we could refill this medicine in the mean time.   [facility] [address] - [phone]

## 2014-01-15 MED ORDER — ANASTROZOLE 1 MG PO TABS
1.0000 mg | ORAL_TABLET | Freq: Every day | ORAL | Status: DC
Start: 2014-01-15 — End: 2014-04-10

## 2014-01-15 MED ORDER — PANTOPRAZOLE SODIUM 40 MG PO TBEC: 40.0000 mg | DELAYED_RELEASE_TABLET | Freq: Every day | ORAL | Status: DC

## 2014-01-15 NOTE — Telephone Encounter (Signed)
done

## 2014-01-21 ENCOUNTER — Telehealth: Payer: Self-pay | Admitting: Neurology

## 2014-01-21 ENCOUNTER — Telehealth: Payer: Self-pay | Admitting: *Deleted

## 2014-01-21 NOTE — Telephone Encounter (Signed)
Fioricet # 10  1-2 for headache   No refills  Called into pharmacy

## 2014-01-21 NOTE — Telephone Encounter (Signed)
Prescribe 10 tablets with no refills.

## 2014-01-21 NOTE — Telephone Encounter (Signed)
Fioricet # 10  1-2 for headaceh NO [facility] patient is aware

## 2014-01-21 NOTE — Telephone Encounter (Signed)
Pt has migraines. Needs meds, but they require prior approval. Unable to obtain more info, patient says she is in so much pain, she is unable to tolerate being on the phone. CB# [phone] / Sherri S.

## 2014-01-21 NOTE — Telephone Encounter (Signed)
Patient states she has had a headache for 2 days no relief and does not have any more Fioricet please advise for refills

## 2014-01-22 ENCOUNTER — Telehealth: Payer: Self-pay | Admitting: *Deleted

## 2014-01-22 NOTE — Telephone Encounter (Signed)
Patient is aware that medicaid denied payment of her Fioricet which left her paying $12.30  Medicaid would like for her to try Imitrex  , Amerge , or Maxalt first she understands this and said to discuss with Dr Tomi Likens . She did state her headache was better today

## 2014-02-04 ENCOUNTER — Other Ambulatory Visit: Payer: Self-pay | Admitting: Physician Assistant

## 2014-02-12 ENCOUNTER — Ambulatory Visit (INDEPENDENT_AMBULATORY_CARE_PROVIDER_SITE_OTHER): Payer: Medicare Other | Admitting: Internal Medicine

## 2014-02-12 ENCOUNTER — Encounter: Payer: Self-pay | Admitting: Internal Medicine

## 2014-02-12 VITALS — BP 94/60 | HR 77 | Ht 62.0 in | Wt 129.0 lb

## 2014-02-12 DIAGNOSIS — I4729 Other ventricular tachycardia: Secondary | ICD-10-CM

## 2014-02-12 DIAGNOSIS — I472 Ventricular tachycardia, unspecified: Secondary | ICD-10-CM

## 2014-02-12 DIAGNOSIS — I359 Nonrheumatic aortic valve disorder, unspecified: Secondary | ICD-10-CM

## 2014-02-12 DIAGNOSIS — I10 Essential (primary) hypertension: Secondary | ICD-10-CM

## 2014-02-12 DIAGNOSIS — I272 Pulmonary hypertension, unspecified: Secondary | ICD-10-CM

## 2014-02-12 DIAGNOSIS — I509 Heart failure, unspecified: Secondary | ICD-10-CM

## 2014-02-12 DIAGNOSIS — I2789 Other specified pulmonary heart diseases: Secondary | ICD-10-CM

## 2014-02-12 DIAGNOSIS — I350 Nonrheumatic aortic (valve) stenosis: Secondary | ICD-10-CM

## 2014-02-12 DIAGNOSIS — I2510 Atherosclerotic heart disease of native coronary artery without angina pectoris: Secondary | ICD-10-CM

## 2014-02-12 DIAGNOSIS — E785 Hyperlipidemia, unspecified: Secondary | ICD-10-CM

## 2014-02-12 MED ORDER — PITAVASTATIN CALCIUM 2 MG PO TABS: 2.0000 mg | ORAL_TABLET | Freq: Every day | ORAL | Status: DC

## 2014-02-12 NOTE — Patient Instructions (Addendum)
Your physician wants you to follow-up in: 6 months.  You will receive a reminder letter in the mail two months in advance. If you don't receive a letter, please call our office to schedule the follow-up appointment.   * pitavastain (livalo) - start this in place of atorvastatin

## 2014-02-12 NOTE — Progress Notes (Signed)
OFFICE NOTE  Chief Complaint:  Hospital follow-up  Primary Care Physician: Scarlette Calico, MD  HPI:  [name] a pleasant 68 year old female with a history of COPD and severe pulmonary hypertension. Recently she was put on home oxygen and has been on an oxygen concentrator, which has made a marked improvement in her ability to get around. She has previously seen Dr. Lamonte Sakai with pulmonology; however, that did not go too well and she has basically given up on any further treatments for COPD other than her oxygen. She still uses nebulizers as needed when she is tight, and I recommended Claritin for seasonal allergies today. As you know, she also has mild aortic stenosis and we are continuing to follow that. She is a low-risk stress test in November 2012. Unfortunately she was recently admitted for altered mental status, possible Tylenol overdose, respiratory failure and liver failure. All of which seems to have resolved. She seems to be doing pre-well after discharge.  She returns today for followup. She reports doing fairly well. She has seen Dr. Lake Bells in the interim and he is recommended continue current therapies. She is now seeing a different internist. She was recently placed on low-dose Lipitor, but had been on full dose Lipitor in the past. This was stopped due to her Tylenol overdose. She wants to continue to use Fioricet which is the only medicine she says works for her headaches. She needs to be very careful with her Tylenol intake. I'm concerned about hepatic been metabolized statins and the risk of worsening hepatitis.  PMHx:  Past Medical History  Diagnosis Date  . Angina   . Heart murmur   . Depressed   . Coronary artery disease 2006    2 stents w/previous MI  . Hypertension   . Migraine   . Anxiety   . Myocardial infarction 2006    NSTEMI  . COPD (chronic obstructive pulmonary disease)     oxygen-dependent 4LPM [place]  . Moderate to severe pulmonary hypertension   .  Diastolic CHF   . Cancer of breast dx'd 2008    RT Breast  . Aortic stenosis     mild  . Dyslipidemia   . NSVT (nonsustained ventricular tachycardia)     h/o  . History of nuclear stress test 08/03/2011    attenuation at apex - no perfusion defects   . Abnormal LFTs 08/02/2011  . Acute liver failure 06/19/2013  . Acute renal failure 06/19/2013  . Acute respiratory failure 06/19/2013  . Altered mental status 08/01/2011  . Anxiety state, unspecified 12/03/2013  . Breast cancer of upper-outer quadrant of left female breast 08/21/2013  . CAD (coronary artery disease) of artery bypass graft 11/06/2013  . CAD (coronary artery disease), MI R/O 08/01/2011    PCI in 2006 (bare metal stent, unknown artery) - [place]   . CHF,  acute diastolic, BNP 4k on admissio 08/01/2011  . Chronic respiratory failure 02/02/2012  . Compression fracture of L1 lumbar vertebra 02/02/2012  . Mild aortic stenosis 08/01/2011    AVA 1.69 cm2 (06/02/2011)   . Hyponatremia 01/31/2012  . Hyperkalemia, on ACE prior to admission 11/11/2011  . HTN (hypertension) 11/06/2013  . History of breast cancer in female 06/23/2013  . 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 Breast lumpectomy Right 2008  . Cardiac catheterization  2006  . Cholecystectomy  2011  . Abdominal surgery  2009  . Transthoracic echocardiogram  11/12/2011  EF 83-15%, normal systolic function, grade 1 diastolic dysfunction; ventricular septal flattening (D-sign); mild AS; trace-mild MR; LA mildly dilated; RV mod dilated; RA mod dilated; severe pulm HTN; elevated CVP    FAMHx:  Family History  Problem Relation Age of Onset  . Schizophrenia Sister   . Alzheimer's disease Mother   . Hyperlipidemia Brother   . Heart disease Sister   . Diabetes Sister     SOCHx:   reports that she has been smoking Cigarettes.  She has a 11.75 pack-year smoking history. She has never used smokeless tobacco. She reports that she drinks  alcohol. She reports that she does not use illicit drugs.  ALLERGIES:  Allergies  Allergen Reactions  . Hydroxyzine Shortness Of Breath and Other (See Comments)    Pt states med make her light headed, get sob sxs  . Ceftriaxone Other (See Comments)    "Blow up like a balloon"  . Lexapro [Escitalopram] Other (See Comments)    Pt states med make her dizzy    ROS: A comprehensive review of systems was negative except for: Respiratory: positive for dyspnea on exertion Gastrointestinal: positive for constipation  HOME MEDS: Current Outpatient Prescriptions  Medication Sig Dispense Refill  . albuterol (PROVENTIL HFA;VENTOLIN HFA) 108 (90 BASE) MCG/ACT inhaler Inhale 2 puffs into the lungs every 4 (four) hours as needed for shortness of breath. Shortness of breath and wheezing  Patient is using [employer] brand      . albuterol (PROVENTIL) (5 MG/ML) 0.5% nebulizer solution Take 2.5 mg by nebulization every 2 (two) hours as needed for wheezing or shortness of breath.      . ALPRAZolam (XANAX) 1 MG tablet Take 1 tablet (1 mg total) by mouth 3 (three) times daily as needed for sleep or anxiety.  90 tablet  0  . anastrozole (ARIMIDEX) 1 MG tablet Take 1 tablet (1 mg total) by mouth at bedtime.  90 tablet  0  . aspirin 325 MG EC tablet Take 325 mg by mouth daily.      Marland Kitchen atorvastatin (LIPITOR) 20 MG tablet Take 1 tablet (20 mg total) by mouth daily.  90 tablet  1  . bisacodyl (DULCOLAX) 5 MG EC tablet Take 10 mg by mouth 2 (two) times daily as needed (for constipation).      . budesonide-formoterol (SYMBICORT) 160-4.5 MCG/ACT inhaler Inhale 2 puffs into the lungs 2 (two) times daily.  3 Inhaler  3  . butalbital-acetaminophen-caffeine (FIORICET) 50-325-40 MG per tablet Take 1-2 pills as needed for headache  10 tablet  0  . calcium-vitamin D (OSCAL WITH D) 250-125 MG-UNIT per tablet Take 1 tablet by mouth daily.      . diclofenac sodium (VOLTAREN) 1 % GEL Apply 1 application topically daily as needed  (apply to hands). Hand pain      . furosemide (LASIX) 40 MG tablet Take 40 mg by mouth 2 (two) times daily.       Marland Kitchen ipratropium (ATROVENT HFA) 17 MCG/ACT inhaler Inhale 2 puffs into the lungs every 4 (four) hours as needed (for shortness of breath).      . lactulose (CHRONULAC) 10 GM/15ML solution Take 20 g by mouth 2 (two) times daily as needed (constipation).      . montelukast (SINGULAIR) 10 MG tablet Take 10 mg by mouth at bedtime.       . Multiple Vitamins-Minerals (MULTIVITAMIN WITH MINERALS) tablet Take 1 tablet by mouth daily.      . nortriptyline (PAMELOR) 25 MG capsule Take  2 capsules (50 mg total) by mouth at bedtime.  60 capsule  3  . Omega 3 1200 MG CAPS Take 1,200 mg by mouth daily.       . ondansetron (ZOFRAN) 8 MG tablet Take 8 mg by mouth 2 (two) times daily as needed for nausea or vomiting.      . OXYGEN-HELIUM IN Inhale 3 L into the lungs as needed.      . pantoprazole (PROTONIX) 40 MG tablet Take 1 tablet (40 mg total) by mouth daily.  90 tablet  0  . potassium chloride SA (K-DUR,KLOR-CON) 20 MEQ tablet Take 20 mEq by mouth daily.      . Zoledronic Acid (ZOMETA IV) Inject 4 mg into the vein every 6 (six) months.       . Pitavastatin Calcium 2 MG TABS Take 1 tablet (2 mg total) by mouth daily. STOP LIPITOR  30 tablet  6   No current facility-administered medications for this visit.    LABS/IMAGING: No results found for this or any previous visit (from the past 48 hour(s)). No results found.  VITALS: BP 94/60  Pulse 77  Ht 5\' 2"  (1.575 m)  Wt 129 lb (58.514 kg)  BMI 23.59 kg/m2  EXAM: General appearance: alert, appears older than stated age and mild distress Neck: no carotid bruit and no JVD Lungs: diminished breath sounds bilaterally and faint rhonchi Heart: regular rate and rhythm, S1, S2 normal and systolic murmur: early systolic 2/6, crescendo at 2nd right intercostal space Abdomen: soft, non-tender; bowel sounds normal; no masses,  no  organomegaly Extremities: extremities normal, atraumatic, no cyanosis or edema Pulses: 2+ and symmetric Skin: Skin color, texture, turgor normal. No rashes or lesions Neurologic: Grossly normal Psych: Anxious, upset about daughter being in jail  EKG: Normal sinus rhythm at 77  ASSESSMENT: 1. Severe COPD with severe pulmonary hypertension 2. Mild aortic stenosis 3. Low risk nuclear stress testing in 2012 4. Dyslipidemia - recently started back on lipitor  PLAN: 1.   Ms. Rashad had a repeat lipid profile recently which shows elevated cholesterol. She was started back on low-dose Lipitor which is probably okay as long she keeps her Tylenol dose is down. He would be a little more ideally keep her on a medicine that is more potent and lower her cholesterol but metabolized more often outside of the liver. I would recommend starting a Livalo 2 mg daily.  We have checked and this is covered with her insurance company. Otherwise, would not recommend any changes in her medications. She denies any chest pain. She continues to do well wearing oxygen most of the time. Plan to see her back in 6 months.  Pixie Casino, MD, [facility] Attending Cardiologist [place] 02/12/2014, 5:12 PM

## 2014-02-13 ENCOUNTER — Telehealth: Payer: Self-pay | Admitting: *Deleted

## 2014-02-13 NOTE — Telephone Encounter (Signed)
Faxed prior authorization for livalo to [employer]

## 2014-02-15 ENCOUNTER — Telehealth: Payer: Self-pay | Admitting: Neurology

## 2014-02-15 MED ORDER — BUTALBITAL-APAP-CAFFEINE 50-325-40 MG PO TABS: ORAL_TABLET | ORAL | Status: DC

## 2014-02-15 NOTE — Telephone Encounter (Signed)
[facility] Tomi Likens with a note to patient that she needs an appt for any further refills.

## 2014-02-18 ENCOUNTER — Other Ambulatory Visit: Payer: Medicare Other

## 2014-02-18 ENCOUNTER — Ambulatory Visit: Payer: Medicare Other

## 2014-02-18 ENCOUNTER — Ambulatory Visit: Payer: Medicare Other | Admitting: Oncology

## 2014-02-19 ENCOUNTER — Other Ambulatory Visit: Payer: Medicare Other

## 2014-02-19 ENCOUNTER — Encounter: Payer: Medicare Other | Admitting: Oncology

## 2014-02-19 ENCOUNTER — Ambulatory Visit: Payer: Medicare Other

## 2014-02-20 NOTE — Telephone Encounter (Signed)
Former [name] patient is calling regarding having this refilled. Last filled on 12/11/13. She is scheduled to see Dr. Ronnald Ramp on 03/04/14. Please advise.

## 2014-02-21 ENCOUNTER — Other Ambulatory Visit: Payer: Self-pay | Admitting: Physician Assistant

## 2014-02-21 ENCOUNTER — Telehealth: Payer: Self-pay | Admitting: *Deleted

## 2014-02-21 NOTE — Telephone Encounter (Signed)
Called to notify patient that [place] [employer] approved livalo 2mg  tablet #30 for 30 days from 02/14/2104 to 02/14/2015.  She will check with her pharmacy regaridng the cost.

## 2014-02-21 NOTE — Telephone Encounter (Signed)
Excellent

## 2014-02-25 ENCOUNTER — Ambulatory Visit: Payer: Medicare Other | Admitting: Oncology

## 2014-02-25 ENCOUNTER — Other Ambulatory Visit: Payer: Medicare Other

## 2014-02-27 ENCOUNTER — Encounter: Payer: Self-pay | Admitting: Neurology

## 2014-02-27 ENCOUNTER — Ambulatory Visit (INDEPENDENT_AMBULATORY_CARE_PROVIDER_SITE_OTHER): Payer: Medicare Other | Admitting: Neurology

## 2014-02-27 VITALS — BP 110/70 | HR 80 | Temp 97.5°F | Resp 20 | Ht 62.0 in | Wt 129.3 lb

## 2014-02-27 DIAGNOSIS — I2510 Atherosclerotic heart disease of native coronary artery without angina pectoris: Secondary | ICD-10-CM

## 2014-02-27 DIAGNOSIS — G43009 Migraine without aura, not intractable, without status migrainosus: Secondary | ICD-10-CM

## 2014-02-27 MED ORDER — BUTALBITAL-APAP-CAFFEINE 50-325-40 MG PO TABS: ORAL_TABLET | ORAL | Status: DC

## 2014-02-27 NOTE — Patient Instructions (Signed)
1.  I will provide Fioricet (10 pills per month).  I will provide 5 refills. 2.  Continue Pamelor 3.  Follow up in 6 months.

## 2014-02-27 NOTE — Progress Notes (Signed)
NEUROLOGY FOLLOW UP OFFICE NOTE  ZYANA AMARO [id number]  HISTORY OF PRESENT ILLNESS: [name] is a 68 year old right-handed woman with history of breast cancer, hypertension, CAD, COPD who follows up for transformed migraine.    UPDATE: Gets headaches 3 to 4 days out of the month, much improved since increase in Pamelor.  For headaches, takes 1 to 2 Fioricet.  Other over the counters do not work. Current preventative therapy:  Pamelor 50mg  daily  HISTORY: Onset: 20s Location:  Back of head radiating up to the front Quality:  pounding Intensity:  5/10 Aura:  no Associated symptoms:  Used to have severe nausea, vomiting, dizziness, photophobia and phonophobia up until 1993 when she started Pamelor.  Now, no associated symptoms. Duration:  One hour without medication (40 minutes with medication) Frequency:  Three times a week Triggers:  stress Relieving factors:  medication Activity:  Able to perform daily activities  Past abortive therapy:  Fioricet almost daily, fioricet with codeine (was taking it once a week), Midrin (made her feel numb), Advil (not effective), Tylenol (not effective) Past preventative therapy:  none  Caffeine:  3 cups daily Smoking:  yes Sleep hygiene:  Sometimes good, usually well-rested Stress/depression:  stress Family history of headache:  Mother, sisters, daughter.  She was admitted to the hospital from 06/19/13 to 06/25/13 for acute encephalopathy and acute respiratory failure, due to polysubstance overdose that complicated her underlying COPD and pulmonary hypertension.  Tox screen was positive for barbiturates, opioids, and benzodiazepines.  She was also in acute liver failure, likely due to Lipitor or acetaminophen overuse.  AST 946 and ALT 2872 on 06/22/13.  Ammonia on 06/19/13 was 91.  On admission, she was hypoglycemic in setting of acute liver failure.  She was also in acute kidney insufficiency.  On 06/21/13, Na 141, K 3.9, CO2 24, glucose  164, BUN 43, Cr 1.84.  CT of head on 06/19/13 was reportedly unremarkable.  LFTs normalized by discharge.  EKG (08/09/13): NSR, QT/QTc 350/425.  PAST MEDICAL HISTORY: Past Medical History  Diagnosis Date  . Angina   . Heart murmur   . Depressed   . Coronary artery disease 2006    2 stents w/previous MI  . Hypertension   . Migraine   . Anxiety   . Myocardial infarction 2006    NSTEMI  . COPD (chronic obstructive pulmonary disease)     oxygen-dependent 4LPM [place]  . Moderate to severe pulmonary hypertension   . Diastolic CHF   . Cancer of breast dx'd 2008    RT Breast  . Aortic stenosis     mild  . Dyslipidemia   . NSVT (nonsustained ventricular tachycardia)     h/o  . History of nuclear stress test 08/03/2011    attenuation at apex - no perfusion defects   . Abnormal LFTs 08/02/2011  . Acute liver failure 06/19/2013  . Acute renal failure 06/19/2013  . Acute respiratory failure 06/19/2013  . Altered mental status 08/01/2011  . Anxiety state, unspecified 12/03/2013  . Breast cancer of upper-outer quadrant of left female breast 08/21/2013  . CAD (coronary artery disease) of artery bypass graft 11/06/2013  . CAD (coronary artery disease), MI R/O 08/01/2011    PCI in 2006 (bare metal stent, unknown artery) - [place]   . CHF,  acute diastolic, BNP 4k on admissio 08/01/2011  . Chronic respiratory failure 02/02/2012  . Compression fracture of L1 lumbar vertebra 02/02/2012  . Mild aortic stenosis 08/01/2011  AVA 1.69 cm2 (06/02/2011)   . Hyponatremia 01/31/2012  . Hyperkalemia, on ACE prior to admission 11/11/2011  . HTN (hypertension) 11/06/2013  . History of breast cancer in female 06/23/2013  . 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    MEDICATIONS: Current Outpatient Prescriptions on File Prior to Visit  Medication Sig Dispense Refill  . albuterol (PROVENTIL HFA;VENTOLIN HFA) 108 (90 BASE) MCG/ACT inhaler Inhale 2 puffs into the lungs every 4 (four) hours as needed for shortness of breath.  Shortness of breath and wheezing  Patient is using Pro-Air brand      . albuterol (PROVENTIL) (5 MG/ML) 0.5% nebulizer solution Take 2.5 mg by nebulization every 2 (two) hours as needed for wheezing or shortness of breath.      . ALPRAZolam (XANAX) 1 MG tablet Take 1 tablet (1 mg total) by mouth 3 (three) times daily as needed for sleep or anxiety.  90 tablet  0  . anastrozole (ARIMIDEX) 1 MG tablet Take 1 tablet (1 mg total) by mouth at bedtime.  90 tablet  0  . aspirin 325 MG EC tablet Take 325 mg by mouth daily.      Marland Kitchen atorvastatin (LIPITOR) 20 MG tablet Take 1 tablet (20 mg total) by mouth daily.  90 tablet  1  . bisacodyl (DULCOLAX) 5 MG EC tablet Take 10 mg by mouth 2 (two) times daily as needed (for constipation).      . budesonide-formoterol (SYMBICORT) 160-4.5 MCG/ACT inhaler Inhale 2 puffs into the lungs 2 (two) times daily.  3 Inhaler  3  . butalbital-acetaminophen-caffeine (FIORICET) 50-325-40 MG per tablet Take 1-2 pills as needed for headache  10 tablet  0  . calcium-vitamin D (OSCAL WITH D) 250-125 MG-UNIT per tablet Take 1 tablet by mouth daily.      . diclofenac sodium (VOLTAREN) 1 % GEL Apply 1 application topically daily as needed (apply to hands). Hand pain      . furosemide (LASIX) 40 MG tablet Take 40 mg by mouth 2 (two) times daily.       Marland Kitchen ipratropium (ATROVENT HFA) 17 MCG/ACT inhaler Inhale 2 puffs into the lungs every 4 (four) hours as needed (for shortness of breath).      . lactulose (CHRONULAC) 10 GM/15ML solution Take 20 g by mouth 2 (two) times daily as needed (constipation).      . montelukast (SINGULAIR) 10 MG tablet Take 10 mg by mouth at bedtime.       . Multiple Vitamins-Minerals (MULTIVITAMIN WITH MINERALS) tablet Take 1 tablet by mouth daily.      . nortriptyline (PAMELOR) 25 MG capsule Take 2 capsules (50 mg total) by mouth at bedtime.  60 capsule  3  . Omega 3 1200 MG CAPS Take 1,200 mg by mouth daily.       . ondansetron (ZOFRAN) 8 MG tablet Take 8 mg by  mouth 2 (two) times daily as needed for nausea or vomiting.      . OXYGEN-HELIUM IN Inhale 3 L into the lungs as needed.      . pantoprazole (PROTONIX) 40 MG tablet Take 1 tablet (40 mg total) by mouth daily.  90 tablet  0  . Pitavastatin Calcium 2 MG TABS Take 1 tablet (2 mg total) by mouth daily. STOP LIPITOR  30 tablet  6  . potassium chloride SA (K-DUR,KLOR-CON) 20 MEQ tablet Take 20 mEq by mouth daily.      . Zoledronic Acid (ZOMETA IV) Inject 4 mg into the vein every 6 (  six) months.        No current facility-administered medications on file prior to visit.    ALLERGIES: Allergies  Allergen Reactions  . Hydroxyzine Shortness Of Breath and Other (See Comments)    Pt states med make her light headed, get sob sxs  . Ceftriaxone Other (See Comments)    "Blow up like a balloon"  . Lexapro [Escitalopram] Other (See Comments)    Pt states med make her dizzy    FAMILY HISTORY: Family History  Problem Relation Age of Onset  . Schizophrenia Sister   . Alzheimer's disease Mother   . Hyperlipidemia Brother   . Heart disease Sister   . Diabetes Sister     SOCIAL HISTORY: History   Social History  . Marital Status: Widowed    Spouse Name: N/A    Number of Children: 3  . Years of Education: 12   Occupational History  . Not on file.   Social History Main Topics  . Smoking status: Current Some Day Smoker -- 0.25 packs/day for 47 years    Types: Cigarettes  . Smokeless tobacco: Never Used     Comment: smokes when anxious  . Alcohol Use: Yes     Comment: wine with dinner occasionally  . Drug Use: No  . Sexual Activity: Not Currently     Comment: 3 cig a day   Other Topics Concern  . Not on file   Social History Narrative  . No narrative on file    REVIEW OF SYSTEMS: Constitutional: No fevers, chills, or sweats, no generalized fatigue, change in appetite Eyes: No visual changes, double vision, eye pain Ear, nose and throat: No hearing loss, ear pain, nasal  congestion, sore throat Cardiovascular: No chest pain, palpitations Respiratory:  Wheezing, shortness of breath GastrointestinaI: No nausea, vomiting, diarrhea, abdominal pain, fecal incontinence Genitourinary:  No dysuria, urinary retention or frequency Musculoskeletal:  No neck pain, back pain Integumentary: No rash, pruritus, skin lesions Neurological: as above Psychiatric: No depression, insomnia, anxiety Endocrine: No palpitations, fatigue, diaphoresis, mood swings, change in appetite, change in weight, increased thirst Hematologic/Lymphatic:  No anemia, purpura, petechiae. Allergic/Immunologic: no itchy/runny eyes, nasal congestion, recent allergic reactions, rashes  PHYSICAL EXAM: BP 110/70, HR 80, RR 20, Wt 128 lb General: No acute distress Head:  Normocephalic/atraumatic  IMPRESSION: Migraine without aura  PLAN: Since she has headaches so infrequently now, and she is limited in terms of medication options due to health, I will continue to prescribe Fioricet, only 10 tablets per month.  Follow up in 6 months.    30 minutes spent with the patient, 100% spent counseling and coordinating care.  Metta Clines, DO  CC:  Scarlette Calico, MD

## 2014-02-28 ENCOUNTER — Other Ambulatory Visit: Payer: Self-pay | Admitting: Internal Medicine

## 2014-03-04 ENCOUNTER — Other Ambulatory Visit (INDEPENDENT_AMBULATORY_CARE_PROVIDER_SITE_OTHER): Payer: Medicare Other

## 2014-03-04 ENCOUNTER — Encounter: Payer: Self-pay | Admitting: Internal Medicine

## 2014-03-04 ENCOUNTER — Ambulatory Visit (INDEPENDENT_AMBULATORY_CARE_PROVIDER_SITE_OTHER): Payer: Medicare Other | Admitting: Internal Medicine

## 2014-03-04 VITALS — BP 114/64 | HR 90 | Temp 98.2°F | Resp 20 | Ht 62.0 in | Wt 128.6 lb

## 2014-03-04 DIAGNOSIS — F411 Generalized anxiety disorder: Secondary | ICD-10-CM

## 2014-03-04 DIAGNOSIS — D72829 Elevated white blood cell count, unspecified: Secondary | ICD-10-CM

## 2014-03-04 DIAGNOSIS — I10 Essential (primary) hypertension: Secondary | ICD-10-CM

## 2014-03-04 DIAGNOSIS — Z23 Encounter for immunization: Secondary | ICD-10-CM

## 2014-03-04 DIAGNOSIS — I2510 Atherosclerotic heart disease of native coronary artery without angina pectoris: Secondary | ICD-10-CM

## 2014-03-04 LAB — CBC WITH DIFFERENTIAL/PLATELET
Basophils Absolute: 0 10*3/uL (ref 0.0–0.1)
Basophils Relative: 0.4 % (ref 0.0–3.0)
Eosinophils Absolute: 0.1 10*3/uL (ref 0.0–0.7)
Eosinophils Relative: 1 % (ref 0.0–5.0)
HCT: 42.8 % (ref 36.0–46.0)
Hemoglobin: 13.9 g/dL (ref 12.0–15.0)
Lymphocytes Relative: 15.9 % (ref 12.0–46.0)
Lymphs Abs: 1.5 10*3/uL (ref 0.7–4.0)
MCHC: 32.4 g/dL (ref 30.0–36.0)
MCV: 97.6 fl (ref 78.0–100.0)
Monocytes Absolute: 0.5 10*3/uL (ref 0.1–1.0)
Monocytes Relative: 5.6 % (ref 3.0–12.0)
Neutro Abs: 7.4 10*3/uL (ref 1.4–7.7)
Neutrophils Relative %: 77.1 % — ABNORMAL HIGH (ref 43.0–77.0)
Platelets: 224 10*3/uL (ref 150.0–400.0)
RBC: 4.38 Mil/uL (ref 3.87–5.11)
RDW: 13.8 % (ref 11.5–15.5)
WBC: 9.6 10*3/uL (ref 4.0–10.5)

## 2014-03-04 LAB — BASIC METABOLIC PANEL
BUN: 13 mg/dL (ref 6–23)
CO2: 34 mEq/L — ABNORMAL HIGH (ref 19–32)
Calcium: 10.6 mg/dL — ABNORMAL HIGH (ref 8.4–10.5)
Chloride: 96 mEq/L (ref 96–112)
Creatinine, Ser: 0.9 mg/dL (ref 0.4–1.2)
GFR: 65.3 mL/min (ref >60.00)
Glucose, Bld: 88 mg/dL (ref 70–99)
Potassium: 4.1 mEq/L (ref 3.5–5.1)
Sodium: 139 mEq/L (ref 135–145)

## 2014-03-04 MED ORDER — ALPRAZOLAM 1 MG PO TABS: 1.0000 mg | ORAL_TABLET | Freq: Three times a day (TID) | ORAL | Status: DC | PRN

## 2014-03-04 NOTE — Assessment & Plan Note (Signed)
This is probably related to her tobacco abuse I will recheck her CBC today and will look at an SPEP as well to see if she has lymphoproliferative disease

## 2014-03-04 NOTE — Progress Notes (Signed)
Subjective:    Patient ID: [name], female    DOB: 1946/03/26, 68 y.o.   MRN: [id number]  Hypertension This is a chronic problem. The current episode started more than 1 year ago. The problem has been gradually improving since onset. The problem is controlled. Associated symptoms include anxiety and shortness of breath. Pertinent negatives include no blurred vision, chest pain, headaches, malaise/fatigue, neck pain, orthopnea, palpitations, peripheral edema, PND or sweats. Past treatments include diuretics. The current treatment provides significant improvement. There are no compliance problems.  Hypertensive end-organ damage includes CAD/MI and heart failure.      Review of Systems  Constitutional: Positive for fatigue. Negative for fever, chills, malaise/fatigue, diaphoresis, appetite change and unexpected weight change.  HENT: Negative.   Eyes: Negative.  Negative for blurred vision.  Respiratory: Positive for shortness of breath. Negative for apnea, cough, choking, chest tightness, wheezing and stridor.   Cardiovascular: Negative.  Negative for chest pain, palpitations, orthopnea, leg swelling and PND.  Gastrointestinal: Negative.  Negative for nausea, vomiting, abdominal pain, diarrhea, constipation and blood in stool.  Endocrine: Negative.   Genitourinary: Negative.   Musculoskeletal: Negative.  Negative for arthralgias, back pain, myalgias, neck pain and neck stiffness.  Skin: Negative.   Allergic/Immunologic: Negative.   Neurological: Negative.  Negative for dizziness, tremors, syncope, weakness, light-headedness and headaches.  Hematological: Negative.  Negative for adenopathy. Does not bruise/bleed easily.  Psychiatric/Behavioral: Negative for suicidal ideas, hallucinations, behavioral problems, confusion, sleep disturbance, self-injury, dysphoric mood, decreased concentration and agitation. The patient is nervous/anxious (and an occasional panic attack). The patient is  not hyperactive.        Objective:   Physical Exam  Vitals reviewed. Constitutional: She is oriented to person, place, and time. She appears well-developed and well-nourished.  Non-toxic appearance. She appears ill. No distress.  Thin female on continuous 02  HENT:  Head: Normocephalic and atraumatic.  Mouth/Throat: Oropharynx is clear and moist. No oropharyngeal exudate.  Eyes: Conjunctivae are normal. Right eye exhibits no discharge. Left eye exhibits no discharge. No scleral icterus.  Neck: Normal range of motion. Neck supple. No JVD present. No tracheal deviation present. No thyromegaly present.  Cardiovascular: Normal rate, regular rhythm and intact distal pulses.  Exam reveals no gallop and no friction rub.   Murmur heard.  Decrescendo systolic murmur is present with a grade of 1/6   No diastolic murmur is present  Pulses:      Carotid pulses are 1+ on the right side, and 1+ on the left side.      Radial pulses are 1+ on the right side, and 1+ on the left side.       Femoral pulses are 1+ on the right side, and 1+ on the left side.      Popliteal pulses are 1+ on the right side, and 1+ on the left side.       Dorsalis pedis pulses are 1+ on the right side, and 1+ on the left side.       Posterior tibial pulses are 1+ on the right side, and 1+ on the left side.  Pulmonary/Chest: Effort normal. No accessory muscle usage or stridor. Not tachypneic. No respiratory distress. She has no decreased breath sounds. She has wheezes in the right middle field, the right lower field, the left middle field and the left lower field. She has rhonchi in the right middle field, the right lower field, the left middle field and the left lower field. She has no rales.  Abdominal: Soft. Bowel sounds are normal. She exhibits no distension and no mass. There is no tenderness. There is no rebound and no guarding.  Musculoskeletal: Normal range of motion. She exhibits no edema and no tenderness.    Lymphadenopathy:    She has no cervical adenopathy.  Neurological: She is oriented to person, place, and time.  Skin: Skin is warm and dry. No rash noted. She is not diaphoretic. No erythema. No pallor.  Psychiatric: She has a normal mood and affect. Her behavior is normal. Judgment and thought content normal.     Lab Results  Component Value Date   WBC 15.9* 11/08/2013   HGB 12.1 11/08/2013   HCT 36.7 11/08/2013   PLT 276 11/08/2013   GLUCOSE 98 11/08/2013   CHOL 269* 12/19/2013   TRIG 113.0 12/19/2013   HDL 77.80 12/19/2013   LDLCALC 169* 12/19/2013   ALT 18 12/19/2013   AST 23 12/19/2013   NA 138 11/08/2013   K 4.9 11/08/2013   CL 98 11/08/2013   CREATININE 1.00 11/08/2013   BUN 28* 11/08/2013   CO2 29 11/08/2013   TSH 1.22 12/19/2013   INR 0.87 09/05/2013       Assessment & Plan:

## 2014-03-04 NOTE — Progress Notes (Signed)
Pre visit review using our clinic review tool, if applicable. No additional management support is needed unless otherwise documented below in the visit note. 

## 2014-03-04 NOTE — Assessment & Plan Note (Signed)
She will cont xanax as needed 

## 2014-03-04 NOTE — Assessment & Plan Note (Signed)
Her BP is well controlled I will check her lytes and renal function today 

## 2014-03-04 NOTE — Patient Instructions (Signed)

## 2014-03-06 ENCOUNTER — Encounter: Payer: Self-pay | Admitting: Internal Medicine

## 2014-03-06 LAB — PROTEIN ELECTROPHORESIS, SERUM
Albumin ELP: 52.8 % — ABNORMAL LOW (ref 55.8–66.1)
Alpha-1-Globulin: 4.9 % (ref 2.9–4.9)
Alpha-2-Globulin: 12.8 % — ABNORMAL HIGH (ref 7.1–11.8)
Beta 2: 6.3 % (ref 3.2–6.5)
Beta Globulin: 5.7 % (ref 4.7–7.2)
Gamma Globulin: 17.5 % (ref 11.1–18.8)
Total Protein, Serum Electrophoresis: 7.8 g/dL (ref 6.0–8.3)

## 2014-03-13 ENCOUNTER — Encounter: Payer: Self-pay | Admitting: Internal Medicine

## 2014-03-16 ENCOUNTER — Other Ambulatory Visit: Payer: Self-pay | Admitting: Gynecologic Oncology

## 2014-03-25 ENCOUNTER — Other Ambulatory Visit: Payer: Self-pay | Admitting: Internal Medicine

## 2014-04-03 ENCOUNTER — Telehealth: Payer: Self-pay | Admitting: *Deleted

## 2014-04-03 NOTE — Telephone Encounter (Signed)
Received fax request from [facility] requesting refill on ondansetron.  Message refaxed to [facility] stating No refill, pt no longer under our care-completed therapy per Marlon Pel RN.

## 2014-04-04 ENCOUNTER — Other Ambulatory Visit: Payer: Self-pay | Admitting: Internal Medicine

## 2014-04-10 ENCOUNTER — Encounter: Payer: Self-pay | Admitting: Pulmonary Disease

## 2014-04-10 ENCOUNTER — Encounter: Payer: Self-pay | Admitting: Internal Medicine

## 2014-04-10 ENCOUNTER — Ambulatory Visit (INDEPENDENT_AMBULATORY_CARE_PROVIDER_SITE_OTHER): Payer: Medicare Other | Admitting: Internal Medicine

## 2014-04-10 ENCOUNTER — Ambulatory Visit (INDEPENDENT_AMBULATORY_CARE_PROVIDER_SITE_OTHER): Payer: Medicare Other | Admitting: Pulmonary Disease

## 2014-04-10 VITALS — BP 134/72 | HR 86 | Ht 62.0 in | Wt 133.0 lb

## 2014-04-10 VITALS — BP 128/68 | HR 108 | Temp 98.1°F | Resp 16 | Ht 62.0 in | Wt 132.0 lb

## 2014-04-10 DIAGNOSIS — G43909 Migraine, unspecified, not intractable, without status migrainosus: Secondary | ICD-10-CM

## 2014-04-10 DIAGNOSIS — C50412 Malignant neoplasm of upper-outer quadrant of left female breast: Secondary | ICD-10-CM

## 2014-04-10 DIAGNOSIS — J438 Other emphysema: Secondary | ICD-10-CM

## 2014-04-10 DIAGNOSIS — I10 Essential (primary) hypertension: Secondary | ICD-10-CM

## 2014-04-10 DIAGNOSIS — Z720 Tobacco use: Secondary | ICD-10-CM

## 2014-04-10 DIAGNOSIS — F17200 Nicotine dependence, unspecified, uncomplicated: Secondary | ICD-10-CM

## 2014-04-10 DIAGNOSIS — D72829 Elevated white blood cell count, unspecified: Secondary | ICD-10-CM

## 2014-04-10 DIAGNOSIS — F411 Generalized anxiety disorder: Secondary | ICD-10-CM

## 2014-04-10 DIAGNOSIS — K219 Gastro-esophageal reflux disease without esophagitis: Secondary | ICD-10-CM

## 2014-04-10 DIAGNOSIS — Z23 Encounter for immunization: Secondary | ICD-10-CM

## 2014-04-10 DIAGNOSIS — K5900 Constipation, unspecified: Secondary | ICD-10-CM

## 2014-04-10 DIAGNOSIS — E785 Hyperlipidemia, unspecified: Secondary | ICD-10-CM

## 2014-04-10 DIAGNOSIS — C50419 Malignant neoplasm of upper-outer quadrant of unspecified female breast: Secondary | ICD-10-CM

## 2014-04-10 DIAGNOSIS — G43709 Chronic migraine without aura, not intractable, without status migrainosus: Secondary | ICD-10-CM

## 2014-04-10 MED ORDER — PANTOPRAZOLE SODIUM 40 MG PO TBEC: 40.0000 mg | DELAYED_RELEASE_TABLET | Freq: Every day | ORAL | Status: DC

## 2014-04-10 MED ORDER — ONDANSETRON HCL 8 MG PO TABS: 8.0000 mg | ORAL_TABLET | Freq: Two times a day (BID) | ORAL | Status: DC | PRN

## 2014-04-10 MED ORDER — LACTULOSE ENCEPHALOPATHY 10 GM/15ML PO SOLN: 20.0000 g | Freq: Three times a day (TID) | ORAL | Status: DC

## 2014-04-10 MED ORDER — ANASTROZOLE 1 MG PO TABS: 1.0000 mg | ORAL_TABLET | Freq: Every day | ORAL | Status: DC

## 2014-04-10 MED ORDER — BUTALBITAL-APAP-CAFFEINE 50-325-40 MG PO TABS: ORAL_TABLET | ORAL | Status: DC

## 2014-04-10 NOTE — Patient Instructions (Signed)
Quit smoking Use oxygen regularly Keep using symbicort We will arrange a screening CT in September We will see you back in 6 months or sooner if needed

## 2014-04-10 NOTE — Progress Notes (Signed)
Subjective:    Patient ID: [name], female    DOB: Sep 19, 1946, 68 y.o.   MRN: [id number]  Constipation This is a chronic problem. The current episode started more than 1 year ago. The problem is unchanged. Her stool frequency is 1 time per day. The stool is described as formed and firm. The patient is on a high fiber diet. She does not exercise regularly. There has been adequate water intake. Pertinent negatives include no abdominal pain, anorexia, back pain, bloating, diarrhea, difficulty urinating, fecal incontinence, fever, flatus, hematochezia, hemorrhoids, melena, nausea, rectal pain, vomiting or weight loss. Risk factors include change in medication usage/dosage. She has tried stool softeners and laxatives for the symptoms. The treatment provided moderate relief.      Review of Systems  Constitutional: Negative.  Negative for fever, chills, weight loss, diaphoresis, appetite change and fatigue.  Eyes: Negative.   Respiratory: Positive for shortness of breath and wheezing. Negative for apnea, cough, choking, chest tightness and stridor.   Cardiovascular: Negative.  Negative for chest pain, palpitations and leg swelling.  Gastrointestinal: Positive for constipation. Negative for nausea, vomiting, abdominal pain, diarrhea, blood in stool, melena, hematochezia, abdominal distention, anal bleeding, rectal pain, bloating, anorexia, flatus and hemorrhoids.  Endocrine: Negative.   Genitourinary: Negative.  Negative for difficulty urinating.  Musculoskeletal: Negative.  Negative for arthralgias, back pain, gait problem, joint swelling, myalgias, neck pain and neck stiffness.  Skin: Negative.  Negative for rash.  Allergic/Immunologic: Negative.   Neurological: Positive for headaches (she has chronic,intermittent headaches that are well controlled with butalbitol). Negative for dizziness, tremors, seizures, syncope, facial asymmetry, speech difficulty, weakness, light-headedness and  numbness.  Hematological: Negative.  Negative for adenopathy. Does not bruise/bleed easily.  Psychiatric/Behavioral: Positive for sleep disturbance. Negative for suicidal ideas, hallucinations, behavioral problems, confusion, self-injury, dysphoric mood, decreased concentration and agitation. The patient is nervous/anxious. The patient is not hyperactive.        Objective:   Physical Exam  Vitals reviewed. Constitutional: She is oriented to person, place, and time. She appears well-developed and well-nourished.  Non-toxic appearance. She does not have a sickly appearance. She does not appear ill. No distress.  HENT:  Head: Normocephalic and atraumatic.  Mouth/Throat: Oropharynx is clear and moist. No oropharyngeal exudate.  Eyes: Conjunctivae are normal. Right eye exhibits no discharge. Left eye exhibits no discharge. No scleral icterus.  Neck: Normal range of motion. Neck supple. No JVD present. No tracheal deviation present. No thyromegaly present.  Cardiovascular: Normal rate, regular rhythm, normal heart sounds and intact distal pulses.  Exam reveals no gallop and no friction rub.   No murmur heard. Pulmonary/Chest: Effort normal. No accessory muscle usage or stridor. Not tachypneic. No respiratory distress. She has no decreased breath sounds. She has wheezes in the right middle field, the right lower field, the left middle field and the left lower field. She has rhonchi in the right middle field, the right lower field, the left middle field and the left lower field. She has no rales. She exhibits no tenderness.  Abdominal: Soft. Bowel sounds are normal. She exhibits no distension and no mass. There is no tenderness. There is no rebound and no guarding.  Musculoskeletal: Normal range of motion. She exhibits no edema.  Lymphadenopathy:    She has no cervical adenopathy.  Neurological: She is oriented to person, place, and time.  Skin: Skin is warm and dry. No rash noted. She is not  diaphoretic. No erythema. No pallor.  Psychiatric: She has a  normal mood and affect. Her behavior is normal. Judgment normal.    Lab Results  Component Value Date   WBC 9.6 03/04/2014   HGB 13.9 03/04/2014   HCT 42.8 03/04/2014   PLT 224.0 03/04/2014   GLUCOSE 88 03/04/2014   CHOL 269* 12/19/2013   TRIG 113.0 12/19/2013   HDL 77.80 12/19/2013   LDLCALC 169* 12/19/2013   ALT 18 12/19/2013   AST 23 12/19/2013   NA 139 03/04/2014   K 4.1 03/04/2014   CL 96 03/04/2014   CREATININE 0.9 03/04/2014   BUN 13 03/04/2014   CO2 34* 03/04/2014   TSH 1.22 12/19/2013   INR 0.87 09/05/2013        Assessment & Plan:

## 2014-04-10 NOTE — Assessment & Plan Note (Signed)
She was educated today at length to quit smoking. She has done a good job of cutting back but she needs to stop altogether.  We discussed lung cancer screening today. She has smoked more than 30 pack years and that she smoked as much as one pack per day for more than 25 years or so. For the last 20 years she's been smoking about half a pack or so. She understands that about 1 and four patients who undergo lung cancer screening will have some sort of abnormality. We discussed the ramifications of this today. She is willing to proceed with lung cancer screening.

## 2014-04-10 NOTE — Assessment & Plan Note (Signed)
This has been a stable interval for [name]. She desperately needs to quit smoking. Symbicort seems to be working well for her  Plan:  -continue Symbicort -Quit smoking -She was educated today at the importance of using oxygen 24 hours a day -Followup 6 months

## 2014-04-10 NOTE — Progress Notes (Signed)
Subjective:    Patient ID: [name], female    DOB: [birth year], 68 y.o.   MRN: [id number]  Synopsis: [name] has COPD and requires 3L [place] at rest and is typically non-compliant with her oxygen therapy. She has never had PFTs.  We were worried about pulmonary hypertension in 2014 so we performed a RHC which showed normal pulmonary pressures.    HPI  04/10/2014 ROV > [name] hasn't been out much due to the hot weather.  Her breathing OK in the house.  She has [place] the mall for exercise 4 days a week.  Not coughing much.  She is still smoking, and has been able to go at least 4 to 5 days without a cigarettes.  Still wearing oxygen at home, predictably at night but not always during the daytime.  She is still using symbicort regularly.    Past Medical History  Diagnosis Date  . Angina   . Heart murmur   . Depressed   . Coronary artery disease 2006    2 stents w/previous MI  . Hypertension   . Migraine   . Anxiety   . Myocardial infarction 2006    NSTEMI  . COPD (chronic obstructive pulmonary disease)     oxygen-dependent 4LPM [place]  . Moderate to severe pulmonary hypertension   . Diastolic CHF   . Cancer of breast dx'd 2008    RT Breast  . Aortic stenosis     mild  . Dyslipidemia   . NSVT (nonsustained ventricular tachycardia)     h/o  . History of nuclear stress test 08/03/2011    attenuation at apex - no perfusion defects   . Abnormal LFTs 08/02/2011  . Acute liver failure 06/19/2013  . Acute renal failure 06/19/2013  . Acute respiratory failure 06/19/2013  . Altered mental status 08/01/2011  . Anxiety state, unspecified 12/03/2013  . Breast cancer of upper-outer quadrant of left female breast 08/21/2013  . CAD (coronary artery disease) of artery bypass graft 11/06/2013  . CAD (coronary artery disease), MI R/O 08/01/2011    PCI in 2006 (bare metal stent, unknown artery) - [place]   . CHF,  acute diastolic, BNP 4k on admissio 08/01/2011  . Chronic respiratory failure 02/02/2012   . Compression fracture of L1 lumbar vertebra 02/02/2012  . Mild aortic stenosis 08/01/2011    AVA 1.69 cm2 (06/02/2011)   . Hyponatremia 01/31/2012  . Hyperkalemia, on ACE prior to admission 11/11/2011  . HTN (hypertension) 11/06/2013  . History of breast cancer in female 06/23/2013  . 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  HENT: Negative for postnasal drip, rhinorrhea and sinus pressure.   Respiratory: Positive for shortness of breath. Negative for cough and wheezing.   Cardiovascular: Negative for chest pain, palpitations and leg swelling.       Objective:   Physical Exam  Filed Vitals:   04/10/14 1123  BP: 134/72  Pulse: 86  Height: 5\' 2"  (1.575 m)  Weight: 133 lb (60.328 kg)  SpO2: 100%   3L [place]  Gen: no distress HEENT: NCAT, EOMi PULM: scattered wheeze, no crackles, good air movement CV: RRR, no mgr AB: bS+, soft, nontender Ext: warm, acyanotic, no edema      Assessment & Plan:   COPD (chronic obstructive pulmonary disease) This has been a stable interval for [name]. She desperately needs to quit smoking. Symbicort seems to be working well for her  Plan:  -continue Symbicort -Quit smoking -She was educated today at the importance of using oxygen 24 hours a day -Followup 6 months  Tobacco abuse She was educated today at length to quit smoking. She has done a good job of cutting back but she needs to stop altogether.  We discussed lung cancer screening today. She has smoked more than 30 pack years and that she smoked as much as one pack per day for more than 25 years or so. For the last 20 years she's been smoking about half a pack or so. She understands that about 1 and four patients who undergo lung cancer screening will have some sort of abnormality. We discussed the ramifications of this today. She is willing to proceed with lung cancer screening.    Updated Medication List Outpatient  Encounter Prescriptions as of 04/10/2014  Medication Sig  . albuterol (PROVENTIL HFA;VENTOLIN HFA) 108 (90 BASE) MCG/ACT inhaler Inhale 2 puffs into the lungs every 4 (four) hours as needed for shortness of breath. Shortness of breath and wheezing  Patient is using Pro-Air brand  . albuterol (PROVENTIL) (5 MG/ML) 0.5% nebulizer solution Take 2.5 mg by nebulization every 2 (two) hours as needed for wheezing or shortness of breath.  . ALPRAZolam (XANAX) 1 MG tablet Take 1 tablet (1 mg total) by mouth 3 (three) times daily as needed for sleep or anxiety.  Marland Kitchen anastrozole (ARIMIDEX) 1 MG tablet Take 1 tablet (1 mg total) by mouth at bedtime.  Marland Kitchen aspirin 325 MG EC tablet Take 325 mg by mouth daily.  . bisacodyl (DULCOLAX) 5 MG EC tablet Take 10 mg by mouth 2 (two) times daily as needed (for constipation).  . budesonide-formoterol (SYMBICORT) 160-4.5 MCG/ACT inhaler Inhale 2 puffs into the lungs 2 (two) times daily.  . butalbital-acetaminophen-caffeine (FIORICET) 50-325-40 MG per tablet Take 1-2 pills as needed for headache  . calcium-vitamin D (OSCAL WITH D) 250-125 MG-UNIT per tablet Take 1 tablet by mouth daily.  . diclofenac sodium (VOLTAREN) 1 % GEL Apply 1 application topically daily as needed (apply to hands). Hand pain  . furosemide (LASIX) 40 MG tablet TAKE 1 TABLET TWICE A DAY  . ipratropium (ATROVENT HFA) 17 MCG/ACT inhaler Inhale 2 puffs into the lungs every 4 (four) hours as needed (for shortness of breath).  Marland Kitchen KLOR-CON M20 20 MEQ tablet TAKE 1 TABLET EVERY DAY  . lactulose, encephalopathy, (CHRONULAC) 10 GM/15ML SOLN Take 20 g by mouth 3 (three) times daily as needed.  . montelukast (SINGULAIR) 10 MG tablet TAKE 1 TABLET BY MOUTH EVERY EVENING  . Multiple Vitamins-Minerals (MULTIVITAMIN WITH MINERALS) tablet Take 1 tablet by mouth daily.  . nortriptyline (PAMELOR) 25 MG capsule Take 2 capsules (50 mg total) by mouth at bedtime.  . Omega 3 1200 MG CAPS Take 1,200 mg by mouth daily.   .  ondansetron (ZOFRAN) 8 MG tablet Take 1 tablet (8 mg total) by mouth 2 (two) times daily as needed for nausea or vomiting.  . OXYGEN-HELIUM IN Inhale 3 L into the lungs as needed.  . pantoprazole (PROTONIX) 40 MG tablet Take 1 tablet (40 mg total) by mouth daily.  . Pitavastatin Calcium (LIVALO) 2 MG TABS Take by mouth.  . Zoledronic Acid (ZOMETA IV) Inject 4 mg into the vein every 6 (six) months.   . [DISCONTINUED] lactulose, encephalopathy, (CHRONULAC) 10 GM/15ML SOLN Take 30 mLs (20 g total) by mouth 3 (three) times daily.  . [DISCONTINUED] lactulose, encephalopathy, (CHRONULAC) 10 GM/15ML SOLN Take 30 mLs (20  g total) by mouth 3 (three) times daily.

## 2014-04-10 NOTE — Progress Notes (Signed)
Pre visit review using our clinic review tool, if applicable. No additional management support is needed unless otherwise documented below in the visit note. 

## 2014-04-10 NOTE — Patient Instructions (Signed)

## 2014-04-11 ENCOUNTER — Encounter: Payer: Self-pay | Admitting: Internal Medicine

## 2014-04-11 NOTE — Assessment & Plan Note (Signed)
Her BP is well controlled 

## 2014-04-11 NOTE — Assessment & Plan Note (Signed)
Will cont fioricet and zofran as needed for headache

## 2014-04-11 NOTE — Assessment & Plan Note (Signed)
She is doing well on lactulose and will continue this as needed

## 2014-04-11 NOTE — Assessment & Plan Note (Signed)
Cont xanax as needed 

## 2014-04-12 ENCOUNTER — Other Ambulatory Visit: Payer: Self-pay | Admitting: Internal Medicine

## 2014-05-30 ENCOUNTER — Other Ambulatory Visit: Payer: Self-pay | Admitting: Internal Medicine

## 2014-06-13 ENCOUNTER — Ambulatory Visit (INDEPENDENT_AMBULATORY_CARE_PROVIDER_SITE_OTHER)
Admission: RE | Admit: 2014-06-13 | Discharge: 2014-06-13 | Disposition: A | Discharge status: Home / Self Care | Payer: Medicare Other | Source: Ambulatory Visit | Attending: Pulmonary Disease | Admitting: Pulmonary Disease

## 2014-06-13 DIAGNOSIS — J438 Other emphysema: Secondary | ICD-10-CM

## 2014-06-13 DIAGNOSIS — F17200 Nicotine dependence, unspecified, uncomplicated: Secondary | ICD-10-CM

## 2014-06-13 DIAGNOSIS — Z122 Encounter for screening for malignant neoplasm of respiratory organs: Secondary | ICD-10-CM

## 2014-06-14 ENCOUNTER — Telehealth: Payer: Self-pay

## 2014-06-14 DIAGNOSIS — J984 Other disorders of lung: Secondary | ICD-10-CM

## 2014-06-14 DIAGNOSIS — Z720 Tobacco use: Secondary | ICD-10-CM

## 2014-06-14 DIAGNOSIS — R911 Solitary pulmonary nodule: Secondary | ICD-10-CM

## 2014-06-14 DIAGNOSIS — J449 Chronic obstructive pulmonary disease, unspecified: Secondary | ICD-10-CM

## 2014-06-14 DIAGNOSIS — J438 Other emphysema: Secondary | ICD-10-CM | POA: Insufficient documentation

## 2014-06-14 NOTE — Telephone Encounter (Signed)
Message copied by Len Blalock on Fri Jun 14, 2014  4:42 PM ------      Message from: Lilli Few      Created: Fri Jun 14, 2014  4:20 PM                   ----- Message -----         From: Juanito Doom, MD         Sent: 06/14/2014   9:08 AM           To: Lilli Few, CMA            J,            Please let her know that her CT chest showed small pulmonary nodules. The radiologist recommends another CT chest in 6 months.  Please order another low dose CT in 6 months.            Thanks      B ------

## 2014-06-14 NOTE — Telephone Encounter (Signed)
Spoke with pt, she is aware of results and recs.  Order placed for CT.  Nothing further needed at this time.

## 2014-06-17 ENCOUNTER — Ambulatory Visit (HOSPITAL_BASED_OUTPATIENT_CLINIC_OR_DEPARTMENT_OTHER): Payer: Medicare Other | Admitting: Oncology

## 2014-06-17 ENCOUNTER — Other Ambulatory Visit: Payer: Self-pay

## 2014-06-17 ENCOUNTER — Encounter: Payer: Self-pay | Admitting: Oncology

## 2014-06-17 ENCOUNTER — Other Ambulatory Visit (HOSPITAL_BASED_OUTPATIENT_CLINIC_OR_DEPARTMENT_OTHER): Payer: Medicare Other

## 2014-06-17 ENCOUNTER — Ambulatory Visit (HOSPITAL_BASED_OUTPATIENT_CLINIC_OR_DEPARTMENT_OTHER): Payer: Medicare Other

## 2014-06-17 ENCOUNTER — Other Ambulatory Visit: Payer: Self-pay | Admitting: Emergency Medicine

## 2014-06-17 VITALS — BP 110/68 | HR 84 | Temp 97.7°F | Resp 18 | Ht 62.0 in | Wt 129.6 lb

## 2014-06-17 DIAGNOSIS — C50412 Malignant neoplasm of upper-outer quadrant of left female breast: Secondary | ICD-10-CM

## 2014-06-17 DIAGNOSIS — R911 Solitary pulmonary nodule: Secondary | ICD-10-CM

## 2014-06-17 DIAGNOSIS — Z853 Personal history of malignant neoplasm of breast: Secondary | ICD-10-CM

## 2014-06-17 DIAGNOSIS — F17200 Nicotine dependence, unspecified, uncomplicated: Secondary | ICD-10-CM

## 2014-06-17 DIAGNOSIS — M899 Disorder of bone, unspecified: Secondary | ICD-10-CM

## 2014-06-17 DIAGNOSIS — M949 Disorder of cartilage, unspecified: Secondary | ICD-10-CM

## 2014-06-17 DIAGNOSIS — J438 Other emphysema: Secondary | ICD-10-CM

## 2014-06-17 DIAGNOSIS — I2583 Coronary atherosclerosis due to lipid rich plaque: Secondary | ICD-10-CM

## 2014-06-17 DIAGNOSIS — J431 Panlobular emphysema: Secondary | ICD-10-CM

## 2014-06-17 DIAGNOSIS — I2510 Atherosclerotic heart disease of native coronary artery without angina pectoris: Secondary | ICD-10-CM

## 2014-06-17 DIAGNOSIS — M8580 Other specified disorders of bone density and structure, unspecified site: Secondary | ICD-10-CM

## 2014-06-17 LAB — CBC WITH DIFFERENTIAL/PLATELET
BASO%: 1.3 % (ref 0.0–2.0)
Basophils Absolute: 0.1 10*3/uL (ref 0.0–0.1)
EOS%: 2.4 % (ref 0.0–7.0)
Eosinophils Absolute: 0.2 10*3/uL (ref 0.0–0.5)
HCT: 44.4 % (ref 34.8–46.6)
HGB: 14.4 g/dL (ref 11.6–15.9)
LYMPH%: 23.9 % (ref 14.0–49.7)
MCH: 31.7 pg (ref 25.1–34.0)
MCHC: 32.5 g/dL (ref 31.5–36.0)
MCV: 97.6 fL (ref 79.5–101.0)
MONO#: 0.5 10*3/uL (ref 0.1–0.9)
MONO%: 6.9 % (ref 0.0–14.0)
NEUT#: 4.5 10*3/uL (ref 1.5–6.5)
NEUT%: 65.5 % (ref 38.4–76.8)
Platelets: 253 10*3/uL (ref 145–400)
RBC: 4.55 10*6/uL (ref 3.70–5.45)
RDW: 12.9 % (ref 11.2–14.5)
WBC: 6.8 10*3/uL (ref 3.9–10.3)
lymph#: 1.6 10*3/uL (ref 0.9–3.3)

## 2014-06-17 LAB — COMPREHENSIVE METABOLIC PANEL (CC13)
ALT: 18 U/L (ref 0–55)
AST: 22 U/L (ref 5–34)
Albumin: 3.6 g/dL (ref 3.5–5.0)
Alkaline Phosphatase: 65 U/L (ref 40–150)
Anion Gap: 11 mEq/L (ref 3–11)
BUN: 12.8 mg/dL (ref 7.0–26.0)
CO2: 31 mEq/L — ABNORMAL HIGH (ref 22–29)
Calcium: 10.7 mg/dL — ABNORMAL HIGH (ref 8.4–10.4)
Chloride: 98 mEq/L (ref 98–109)
Creatinine: 1.1 mg/dL (ref 0.6–1.1)
Glucose: 92 mg/dl (ref 70–140)
Potassium: 4.3 mEq/L (ref 3.5–5.1)
Sodium: 140 mEq/L (ref 136–145)
Total Bilirubin: 0.34 mg/dL (ref 0.20–1.20)
Total Protein: 7.9 g/dL (ref 6.4–8.3)

## 2014-06-17 MED ORDER — DENOSUMAB 120 MG/1.7ML ~~LOC~~ SOLN
120.0000 mg | Freq: Once | SUBCUTANEOUS | Status: AC
  Administered 2014-06-17: 120 mg via SUBCUTANEOUS
  Filled 2014-06-17: qty 1.7

## 2014-06-17 NOTE — Progress Notes (Signed)
ID: [name]   DOB: 1946/04/18  MR#: [id number]  FUX#:[id number]  PCP: Barbara Calico, MD GYN:  None SU: in [place] MD:  [name], [name]  CHIEF COMPLAINT: Estrogen receptor positive breast cancer  CURRENT TREATMENT:  Starting observation  BREAST CANCER HISTORY:   from the original intake note:   [name] is a 68 year old [place] woman, who was diagnosed with breast cancer in November 2008.  She initially presented with a right breast mass, which was biopsied.  Subsequently, she had a lumpectomy and lymph node dissection on 09/08/2007.  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  She apparently had 8/14 involved lymph nodes.  She subsequently underwent adjuvant chemotherapy with Taxotere, Carboplatin and Herceptin.  This was complicated by a bowel infarction and she ultimately required resection and had a diverting ileostomy which ultimately was reversed after 8 months.  She was hospitalized for 5 weeks and was in rehab for 4 weeks.  She also had a hernia that required opening and ongoing dressings.  She completed chemotherapy after she recovered from all that and then had a year of Herceptin which she completed in May 2010.  She subsequently had radiation therapy to the right breast and axilla.  She was started on Arimidex and has been on Arimidex ever since.    Her subsequent history is as detailed below.  INTERVAL HISTORY:   [name] returns today for followup of her breast cancer. The interval history is significant for continuing problems regarding her lungs. She is followed closely by doctors [name] and [name]. Unfortunately she has not quite discontinued smoking at this point   REVIEW OF SYSTEMS: She gets cramps in her toes when she walks and she gets cramps in her calves at night which is lying in bed. He tells me she hydrate yourself  well and she is on potassium supplementation. She is having some sinus problems she thinks are due to ragweed. She needs glasses and dentures. She sleeps on 3 pillows. She does keep a productive cough although she has seen no blood or purulent sputum recently. She has heartburn and rarely vomits. She bruises easily. She has significant arthritis problems, but these are not more intense or frequent than before. She has rare headaches. She feels anxious but not depressed. A detailed review of systems today was otherwise stable  PAST MEDICAL HISTORY: Past Medical History  Diagnosis Date  . Angina   . Heart murmur   . Depressed   . Coronary artery disease 2006    2 stents w/previous MI  . Hypertension   . Migraine   . Anxiety   . Myocardial infarction 2006    NSTEMI  . COPD (chronic obstructive pulmonary disease)     oxygen-dependent 4LPM [place]  . Moderate to severe pulmonary hypertension   . Diastolic CHF   . Cancer of breast dx'd 2008    RT Breast  . Aortic stenosis     mild  . Dyslipidemia   . NSVT (nonsustained ventricular tachycardia)     h/o  . History of nuclear stress test 08/03/2011    attenuation at apex - no perfusion defects   . Abnormal LFTs 08/02/2011  . Acute liver failure 06/19/2013  . Acute renal failure 06/19/2013  . Acute respiratory failure 06/19/2013  . Altered mental status 08/01/2011  . Anxiety state, unspecified 12/03/2013  . Breast cancer  of upper-outer quadrant of left female breast 08/21/2013  . CAD (coronary artery disease) of artery bypass graft 11/06/2013  . CAD (coronary artery disease), MI R/O 08/01/2011    PCI in 2006 (bare metal stent, unknown artery) - [place]   . CHF,  acute diastolic, BNP 4k on admissio 08/01/2011  . Chronic respiratory failure 02/02/2012  . Compression fracture of L1 lumbar vertebra 02/02/2012  . Mild aortic stenosis 08/01/2011    AVA 1.69 cm2 (06/02/2011)   . Hyponatremia 01/31/2012  . Hyperkalemia, on ACE prior to admission 11/11/2011  . HTN  (hypertension) 11/06/2013  . History of breast cancer in female 06/23/2013  . 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 Breast lumpectomy Right 2008  . Cardiac catheterization  2006  . Cholecystectomy  2011  . Abdominal surgery  2009  . Transthoracic echocardiogram  11/12/2011    EF 27-03%, normal systolic function, grade 1 diastolic dysfunction; ventricular septal flattening (D-sign); mild AS; trace-mild MR; LA mildly dilated; RV mod dilated; RA mod dilated; severe pulm HTN; elevated CVP    FAMILY HISTORY Family History  Problem Relation Age of Onset  . Schizophrenia Sister   . Alzheimer's disease Mother   . Hyperlipidemia Brother   . Heart disease Sister   . Diabetes Sister   . Cancer Neg Hx   . Stroke Neg Hx   . COPD Neg Hx   . Depression Neg Hx   . Drug abuse Neg Hx   . Early death Neg Hx   . Hypertension Neg Hx   . Kidney disease Neg Hx     GYNECOLOGIC HISTORY: She is G4, P3.  Menarche age 21.  Menopause at age 45.  No history of hormone replacement therapy.   SOCIAL HISTORY: (Updated November 2014)  She is widowed. She tells me her daughter [name] has bipolar disorder and is currently incarcerated, but will be release November of 2015 and is planning to move to this area. The 2 grandchildren were sent to [place]. The patient is currently renting a room at a friend's house    ADVANCED DIRECTIVES:  HEALTH MAINTENANCE: History  Substance Use Topics  . Smoking status: Current Some Day Smoker -- 0.25 packs/day for 47 years    Types: Cigarettes  . Smokeless tobacco: Never Used     Comment: smokes when anxious  . Alcohol Use: No     Comment: wine with dinner occasionally     Colonoscopy:  Not had.  PAP: 2010.  Advised of the importance  Bone density: 11/01/11/ osteopenia  Lipid panel:    Allergies  Allergen Reactions  . Hydroxyzine Shortness Of Breath and Other (See Comments)    Pt states  med make her light headed, get sob sxs  . Ceftriaxone Other (See Comments)    "Blow up like a balloon"  . Lexapro [Escitalopram] Other (See Comments)    Pt states med make her dizzy    Current Outpatient Prescriptions  Medication Sig Dispense Refill  . albuterol (PROVENTIL HFA;VENTOLIN HFA) 108 (90 BASE) MCG/ACT inhaler Inhale 2 puffs into the lungs every 4 (four) hours as needed for shortness of breath. Shortness of breath and wheezing  Patient is using Pro-Air brand      . albuterol (PROVENTIL) (5 MG/ML) 0.5% nebulizer solution Take 2.5 mg by nebulization every 2 (two) hours as needed for wheezing or shortness of breath.      . ALPRAZolam Duanne Moron) 1  MG tablet Take 1 tablet (1 mg total) by mouth 3 (three) times daily as needed for sleep or anxiety.  90 tablet  3  . anastrozole (ARIMIDEX) 1 MG tablet Take 1 tablet (1 mg total) by mouth at bedtime.  90 tablet  0  . anastrozole (ARIMIDEX) 1 MG tablet TAKE 1 TABLET BY MOUTH ONCE A DAY AT BEDTIME.  90 tablet  0  . aspirin 325 MG EC tablet Take 325 mg by mouth daily.      . ATROVENT HFA 17 MCG/ACT inhaler USE 2 PUFFS EVERY FOUR HOURS AS NEEDED  77.4 g  1  . bisacodyl (DULCOLAX) 5 MG EC tablet Take 10 mg by mouth 2 (two) times daily as needed (for constipation).      . budesonide-formoterol (SYMBICORT) 160-4.5 MCG/ACT inhaler Inhale 2 puffs into the lungs 2 (two) times daily.  3 Inhaler  3  . butalbital-acetaminophen-caffeine (FIORICET) 50-325-40 MG per tablet Take 1-2 pills as needed for headache  30 tablet  5  . calcium-vitamin D (OSCAL WITH D) 250-125 MG-UNIT per tablet Take 1 tablet by mouth daily.      . diclofenac sodium (VOLTAREN) 1 % GEL Apply 1 application topically daily as needed (apply to hands). Hand pain      . furosemide (LASIX) 40 MG tablet TAKE 1 TABLET TWICE A DAY  180 tablet  1  . KLOR-CON M20 20 MEQ tablet TAKE 1 TABLET EVERY DAY  180 tablet  1  . lactulose, encephalopathy, (CHRONULAC) 10 GM/15ML SOLN Take 20 g by mouth 3 (three)  times daily as needed.      . montelukast (SINGULAIR) 10 MG tablet TAKE 1 TABLET BY MOUTH EVERY EVENING  90 tablet  3  . Multiple Vitamins-Minerals (MULTIVITAMIN WITH MINERALS) tablet Take 1 tablet by mouth daily.      . nortriptyline (PAMELOR) 25 MG capsule Take 2 capsules (50 mg total) by mouth at bedtime.  60 capsule  3  . Omega 3 1200 MG CAPS Take 1,200 mg by mouth daily.       . ondansetron (ZOFRAN) 8 MG tablet Take 1 tablet (8 mg total) by mouth 2 (two) times daily as needed for nausea or vomiting.  30 tablet  11  . OXYGEN-HELIUM IN Inhale 3 L into the lungs as needed.      . pantoprazole (PROTONIX) 40 MG tablet Take 1 tablet (40 mg total) by mouth daily.  90 tablet  3  . pantoprazole (PROTONIX) 40 MG tablet TAKE 1 TABLET BY MOUTH ONCE DAILY.  90 tablet  0  . Pitavastatin Calcium (LIVALO) 2 MG TABS Take by mouth.      . Zoledronic Acid (ZOMETA IV) Inject 4 mg into the vein every 6 (six) months.        No current facility-administered medications for this visit.    OBJECTIVE: Middle-aged white woman examined at wearing oxygen by nasal cannula with a portable tach Filed Vitals:   06/17/14 1347  BP: 110/68  Pulse: 84  Temp: 97.7 F (36.5 C)  Resp: 18     Body mass index is 23.7 kg/(m^2).    ECOG FS:  2  Sclerae unicteric, EOMs intact Oropharynx clear and moist, no thrush or other lesions No cervical or supraclavicular adenopathy Lungs no rales or rhonchi but scattered wheezes, fair excursion bilaterally Heart regular rate and rhythm Abd soft, nontender, positive bowel sounds MSK no focal spinal tenderness, no upper extremity lymphedema Neuro: nonfocal, well oriented, positive affect Breasts: The right  breast is status post lumpectomy and radiation. There is no evidence of local recurrence. The right axilla is benign The left breast is unremarkable.   LAB RESULTS: Lab Results  Component Value Date   WBC 6.8 06/17/2014   NEUTROABS 4.5 06/17/2014   HGB 14.4 06/17/2014   HCT  44.4 06/17/2014   MCV 97.6 06/17/2014   PLT 253 06/17/2014      Chemistry      Component Value Date/Time   NA 139 03/04/2014 1405   NA 142 08/21/2013 1349   K 4.1 03/04/2014 1405   K 3.8 08/21/2013 1349   CL 96 03/04/2014 1405   CO2 34* 03/04/2014 1405   CO2 34* 08/21/2013 1349   BUN 13 03/04/2014 1405   BUN 17.2 08/21/2013 1349   CREATININE 0.9 03/04/2014 1405   CREATININE 1.0 08/21/2013 1349      Component Value Date/Time   CALCIUM 10.6* 03/04/2014 1405   CALCIUM 11.0* 08/21/2013 1349   ALKPHOS 61 12/19/2013 1127   ALKPHOS 87 08/21/2013 1349   AST 23 12/19/2013 1127   AST 21 08/21/2013 1349   ALT 18 12/19/2013 1127   ALT 27 08/21/2013 1349   BILITOT 0.6 12/19/2013 1127   BILITOT 0.40 08/21/2013 1349       Lab Results  Component Value Date   LABCA2 24 07/28/2011    No components found with this basename: [id number]    No results found for this basename: INR,  in the last 168 hours  Urinalysis    Component Value Date/Time   COLORURINE YELLOW 12/19/2013 [place] 12/19/2013 1127   LABSPEC <=1.005* 12/19/2013 1127   PHURINE 7.0 12/19/2013 1127   GLUCOSEU NEGATIVE 12/19/2013 1127   GLUCOSEU 100* 06/20/2013 0059   HGBUR NEGATIVE 12/19/2013 1127   BILIRUBINUR NEGATIVE 12/19/2013 1127   KETONESUR NEGATIVE 12/19/2013 1127   PROTEINUR >300* 06/20/2013 0059   UROBILINOGEN 0.2 12/19/2013 1127   NITRITE NEGATIVE 12/19/2013 1127   LEUKOCYTESUR MODERATE* 12/19/2013 1127    STUDIES: Ct Chest Low Dose Screening W/o Cm  06/13/2014   CLINICAL DATA:  69 year old female current smoker with 42 pack year history of smoking. Lung cancer screening examination. History breast cancer diagnosed in 2008.  EXAM: CT CHEST WITHOUT CONTRAST  TECHNIQUE: Multidetector CT imaging of the chest was performed following the standard protocol without intravenous contrast. High resolution imaging of the lungs, as well as inspiratory and expiratory imaging, was performed.  COMPARISON:  Chest CT 01/31/2012.   FINDINGS: Mediastinum: Heart size is normal. There is no significant pericardial fluid, thickening or pericardial calcification. There is atherosclerosis of the thoracic aorta, the great vessels of the mediastinum and the coronary arteries, including calcified atherosclerotic plaque in the left main, left anterior descending, left circumflex and right coronary arteries. Calcifications of the aortic valve. Multiple prominent borderline enlarged mediastinal lymph nodes. Enlarged lower right paratracheal lymph node measuring 1.6 cm in short axis appears slightly smaller than remote prior study 01/31/2012. Please note that accurate exclusion of hilar adenopathy is limited on noncontrast CT scans. Esophagus is unremarkable in appearance.  Lungs/Pleura: Mass-like area of chronic consolidation in the apex of the right upper lobe, slightly less pronounced than remote prior examination from 01/31/2012, most compatible with postradiation mass-like fibrosis. There continue be some additional areas of subpleural fibrosis in the anterior aspect of the right lung, which have also slightly decreased in prominence compared to the prior examination. There is a focal nodular opacity in the medial segment of the right  middle lobe, which is less pronounced than the prior examination, most compatible with an area of chronic scarring. There is also some mild scarring in the inferior segment of the lingula. No definite large suspicious appearing pulmonary nodules or masses are noted. There are a few small pulmonary nodules scattered throughout the lungs bilaterally, largest of which is in the left lower lobe measuring 7 x 5 mm (mean diameter of 6 mm) on image 249 of series 5. Mild diffuse bronchial wall thickening with very mild centrilobular and paraseptal emphysema. In addition, there is a pattern of diffuse centrilobular ground-glass attenuation micronodularity. No pleural effusions.  Upper Abdomen: Status post cholecystectomy.   Musculoskeletal: There are no aggressive appearing lytic or blastic lesions noted in the visualized portions of the skeleton.  IMPRESSION: 1. Multiple small pulmonary nodules in lungs bilaterally, largest of which has a mean diameter of 6 mm in the left lower lobe (Image 249 of series 5). Lung-RADS Category 3, probably benign findings. Short-term follow up in 6 months is recommended with low-dose chest CT without contrast (please use the following order, "CT chest low-dose screening follow-up"). 2. Mild diffuse bronchial wall thickening with very mild centrilobular and paraseptal emphysema; imaging findings suggestive of underlying COPD. 3. In addition, there is a pattern of diffuse centrilobular ground-glass attenuation micronodularity, favored to reflect a smoking related disease such as respiratory bronchiolitis (RB), or if the patient is symptomatic, respiratory bronchiolitis interstitial lung disease (RB-ILD). 4. Atherosclerosis, including left main and 3 vessel coronary artery disease. Please note that although the presence of coronary artery calcium documents the presence of coronary artery disease, the severity of this disease and any potential stenosis cannot be assessed on this non-gated CT examination. Assessment for potential risk factor modification, dietary therapy or pharmacologic therapy may be warranted, if clinically indicated. 5. Additional findings, as above.   Electronically Signed   By: Vinnie Langton M.D.   On: 06/13/2014 17:08    ASSESSMENT: 68 y.o. [place] woman: #1  S/P right breast lumpectomy with lymph node dissection on 09/08/07 in [place] for a T1c N2a IDC of the right breast, ER+, PR-, HER2 3+ with 8 of 14 nodes involved.  #2  She completed [facility] with Herceptin for one year in May 2010.  #3  She underwent radiation therapy in May of 2010.  #4  She started Arimidex in 2010 and has tolerated it well since.   #5 osteopenia. Received zolendronate April 2014 and denosumab  06/17/2014  #6 continuing tobacco abuse. The patient has been again advised about permanently quitting  #7 emphysema plus or minus bronchiolitis, with multiple small pulmonary nodules felt to be likely benign  PLAN:   Kierstan is now nearly 7 years out from her definitive lumpectomy. She completed 5 years of anastrozole. There is no definite evidence of breast cancer metastases or recurrence. She is at significant risk for a primary pulmonary malignancy. However she has excellent cardiac and pulmonary followup and at this point I feel comfortable releasing her to her primary care physician.  She understands as far as breast cancer screening is concerned she will need yearly mammography and a yearly physician breast exam. Of course I will be glad to see her again at any point in the future if the need arises, but as of now we're making no further appointments for her here.  Nanci Lakatos C    06/17/2014

## 2014-06-17 NOTE — Progress Notes (Signed)
Quick Note:  Pt is aware, ct has been ordered for 6 months out. Nothing further needed at this time. ______

## 2014-06-19 ENCOUNTER — Telehealth: Payer: Self-pay | Admitting: Oncology

## 2014-06-19 NOTE — Addendum Note (Signed)
Addended by: Jaci Carrel A on: 06/19/2014 09:08 AM   Modules accepted: Orders

## 2014-06-19 NOTE — Telephone Encounter (Signed)
Sent letter to [place] office from Dr. Jana Hakim

## 2014-07-07 ENCOUNTER — Inpatient Hospital Stay (HOSPITAL_COMMUNITY)
Admission: EM | Admit: 2014-07-07 | Discharge: 2014-07-11 | DRG: 871 | Disposition: A | Discharge status: Home / Self Care | Payer: Medicare Other | Attending: Internal Medicine | Admitting: Internal Medicine

## 2014-07-07 ENCOUNTER — Encounter (HOSPITAL_COMMUNITY): Payer: Self-pay | Admitting: Emergency Medicine

## 2014-07-07 ENCOUNTER — Emergency Department (HOSPITAL_COMMUNITY): Payer: Medicare Other

## 2014-07-07 DIAGNOSIS — I10 Essential (primary) hypertension: Secondary | ICD-10-CM | POA: Diagnosis present

## 2014-07-07 DIAGNOSIS — I272 Other secondary pulmonary hypertension: Secondary | ICD-10-CM | POA: Diagnosis present

## 2014-07-07 DIAGNOSIS — Z853 Personal history of malignant neoplasm of breast: Secondary | ICD-10-CM

## 2014-07-07 DIAGNOSIS — Z8249 Family history of ischemic heart disease and other diseases of the circulatory system: Secondary | ICD-10-CM | POA: Diagnosis not present

## 2014-07-07 DIAGNOSIS — R011 Cardiac murmur, unspecified: Secondary | ICD-10-CM | POA: Diagnosis present

## 2014-07-07 DIAGNOSIS — Z955 Presence of coronary angioplasty implant and graft: Secondary | ICD-10-CM

## 2014-07-07 DIAGNOSIS — F329 Major depressive disorder, single episode, unspecified: Secondary | ICD-10-CM | POA: Diagnosis present

## 2014-07-07 DIAGNOSIS — E785 Hyperlipidemia, unspecified: Secondary | ICD-10-CM

## 2014-07-07 DIAGNOSIS — A419 Sepsis, unspecified organism: Secondary | ICD-10-CM | POA: Diagnosis present

## 2014-07-07 DIAGNOSIS — J438 Other emphysema: Secondary | ICD-10-CM

## 2014-07-07 DIAGNOSIS — J9601 Acute respiratory failure with hypoxia: Secondary | ICD-10-CM | POA: Diagnosis present

## 2014-07-07 DIAGNOSIS — I5032 Chronic diastolic (congestive) heart failure: Secondary | ICD-10-CM | POA: Diagnosis present

## 2014-07-07 DIAGNOSIS — F17210 Nicotine dependence, cigarettes, uncomplicated: Secondary | ICD-10-CM | POA: Diagnosis present

## 2014-07-07 DIAGNOSIS — I2510 Atherosclerotic heart disease of native coronary artery without angina pectoris: Secondary | ICD-10-CM | POA: Diagnosis present

## 2014-07-07 DIAGNOSIS — J9610 Chronic respiratory failure, unspecified whether with hypoxia or hypercapnia: Secondary | ICD-10-CM | POA: Diagnosis present

## 2014-07-07 DIAGNOSIS — Z9981 Dependence on supplemental oxygen: Secondary | ICD-10-CM | POA: Diagnosis not present

## 2014-07-07 DIAGNOSIS — G43909 Migraine, unspecified, not intractable, without status migrainosus: Secondary | ICD-10-CM | POA: Diagnosis present

## 2014-07-07 DIAGNOSIS — Z79899 Other long term (current) drug therapy: Secondary | ICD-10-CM

## 2014-07-07 DIAGNOSIS — I472 Ventricular tachycardia: Secondary | ICD-10-CM | POA: Diagnosis present

## 2014-07-07 DIAGNOSIS — I350 Nonrheumatic aortic (valve) stenosis: Secondary | ICD-10-CM | POA: Diagnosis present

## 2014-07-07 DIAGNOSIS — J441 Chronic obstructive pulmonary disease with (acute) exacerbation: Secondary | ICD-10-CM

## 2014-07-07 DIAGNOSIS — Z720 Tobacco use: Secondary | ICD-10-CM

## 2014-07-07 DIAGNOSIS — F419 Anxiety disorder, unspecified: Secondary | ICD-10-CM | POA: Diagnosis present

## 2014-07-07 DIAGNOSIS — Z7982 Long term (current) use of aspirin: Secondary | ICD-10-CM | POA: Diagnosis not present

## 2014-07-07 DIAGNOSIS — J189 Pneumonia, unspecified organism: Secondary | ICD-10-CM | POA: Diagnosis present

## 2014-07-07 DIAGNOSIS — Z881 Allergy status to other antibiotic agents status: Secondary | ICD-10-CM | POA: Diagnosis not present

## 2014-07-07 DIAGNOSIS — Z888 Allergy status to other drugs, medicaments and biological substances status: Secondary | ICD-10-CM

## 2014-07-07 DIAGNOSIS — Z23 Encounter for immunization: Secondary | ICD-10-CM | POA: Diagnosis not present

## 2014-07-07 DIAGNOSIS — I252 Old myocardial infarction: Secondary | ICD-10-CM

## 2014-07-07 DIAGNOSIS — Z9889 Other specified postprocedural states: Secondary | ICD-10-CM | POA: Diagnosis not present

## 2014-07-07 DIAGNOSIS — K219 Gastro-esophageal reflux disease without esophagitis: Secondary | ICD-10-CM

## 2014-07-07 DIAGNOSIS — F411 Generalized anxiety disorder: Secondary | ICD-10-CM

## 2014-07-07 DIAGNOSIS — J9611 Chronic respiratory failure with hypoxia: Secondary | ICD-10-CM | POA: Diagnosis not present

## 2014-07-07 LAB — BASIC METABOLIC PANEL
Anion gap: 12 (ref 5–15)
BUN: 15 mg/dL (ref 6–23)
CO2: 30 mEq/L (ref 19–32)
Calcium: 9.8 mg/dL (ref 8.4–10.5)
Chloride: 95 mEq/L — ABNORMAL LOW (ref 96–112)
Creatinine, Ser: 1.26 mg/dL — ABNORMAL HIGH (ref 0.50–1.10)
GFR calc Af Amer: 50 mL/min — ABNORMAL LOW (ref >90)
GFR calc non Af Amer: 43 mL/min — ABNORMAL LOW (ref >90)
Glucose, Bld: 89 mg/dL (ref 70–99)
Potassium: 4.1 mEq/L (ref 3.7–5.3)
Sodium: 137 mEq/L (ref 137–147)

## 2014-07-07 LAB — TROPONIN I: Troponin I: <0.3 ng/mL (ref <0.30)

## 2014-07-07 LAB — INFLUENZA PANEL BY PCR (TYPE A & B)
H1N1 flu by pcr: NOT DETECTED
Influenza A By PCR: NEGATIVE
Influenza B By PCR: NEGATIVE

## 2014-07-07 LAB — I-STAT TROPONIN, ED: Troponin i, poc: 0 ng/mL (ref 0.00–0.08)

## 2014-07-07 LAB — CBC
HCT: 41.2 % (ref 36.0–46.0)
Hemoglobin: 13.6 g/dL (ref 12.0–15.0)
MCH: 32.2 pg (ref 26.0–34.0)
MCHC: 33 g/dL (ref 30.0–36.0)
MCV: 97.6 fL (ref 78.0–100.0)
Platelets: 252 10*3/uL (ref 150–400)
RBC: 4.22 MIL/uL (ref 3.87–5.11)
RDW: 12.8 % (ref 11.5–15.5)
WBC: 14.4 10*3/uL — ABNORMAL HIGH (ref 4.0–10.5)

## 2014-07-07 LAB — PRO B NATRIURETIC PEPTIDE: Pro B Natriuretic peptide (BNP): 293.8 pg/mL — ABNORMAL HIGH (ref 0–125)

## 2014-07-07 MED ORDER — MONTELUKAST SODIUM 10 MG PO TABS
10.0000 mg | ORAL_TABLET | Freq: Every day | ORAL | Status: DC
  Administered 2014-07-07 – 2014-07-10 (×4): 10 mg via ORAL
  Filled 2014-07-07 (×7): qty 1

## 2014-07-07 MED ORDER — IPRATROPIUM BROMIDE 0.02 % IN SOLN
0.5000 mg | Freq: Once | RESPIRATORY_TRACT | Status: AC
  Administered 2014-07-07: 0.5 mg via RESPIRATORY_TRACT
  Filled 2014-07-07: qty 2.5

## 2014-07-07 MED ORDER — CALCIUM CARBONATE-VITAMIN D 500-200 MG-UNIT PO TABS
0.5000 | ORAL_TABLET | Freq: Every day | ORAL | Status: DC
  Administered 2014-07-08 – 2014-07-11 (×4): 0.5 via ORAL
  Filled 2014-07-07 (×6): qty 0.5

## 2014-07-07 MED ORDER — ACETAMINOPHEN 325 MG PO TABS: 650.0000 mg | ORAL_TABLET | Freq: Four times a day (QID) | ORAL | Status: DC | PRN

## 2014-07-07 MED ORDER — ALBUTEROL SULFATE (2.5 MG/3ML) 0.083% IN NEBU
2.5000 mg | INHALATION_SOLUTION | RESPIRATORY_TRACT | Status: DC | PRN
Start: 2014-07-07 — End: 2014-07-11

## 2014-07-07 MED ORDER — ENOXAPARIN SODIUM 40 MG/0.4ML ~~LOC~~ SOLN
40.0000 mg | SUBCUTANEOUS | Status: DC
  Administered 2014-07-07 – 2014-07-10 (×4): 40 mg via SUBCUTANEOUS
  Filled 2014-07-07 (×5): qty 0.4

## 2014-07-07 MED ORDER — GUAIFENESIN ER 600 MG PO TB12
600.0000 mg | ORAL_TABLET | Freq: Two times a day (BID) | ORAL | Status: DC
  Administered 2014-07-07 – 2014-07-11 (×9): 600 mg via ORAL
  Filled 2014-07-07 (×10): qty 1

## 2014-07-07 MED ORDER — FUROSEMIDE 40 MG PO TABS
40.0000 mg | ORAL_TABLET | Freq: Two times a day (BID) | ORAL | Status: DC
  Administered 2014-07-08 – 2014-07-09 (×2): 40 mg via ORAL
  Filled 2014-07-07 (×5): qty 1

## 2014-07-07 MED ORDER — ALBUTEROL SULFATE (2.5 MG/3ML) 0.083% IN NEBU
5.0000 mg | INHALATION_SOLUTION | Freq: Once | RESPIRATORY_TRACT | Status: AC
  Administered 2014-07-07: 5 mg via RESPIRATORY_TRACT
  Filled 2014-07-07: qty 6

## 2014-07-07 MED ORDER — ACETAMINOPHEN 650 MG RE SUPP: 650.0000 mg | Freq: Four times a day (QID) | RECTAL | Status: DC | PRN

## 2014-07-07 MED ORDER — PRAVASTATIN SODIUM 40 MG PO TABS
40.0000 mg | ORAL_TABLET | Freq: Every day | ORAL | Status: DC
  Administered 2014-07-07 – 2014-07-10 (×4): 40 mg via ORAL
  Filled 2014-07-07 (×5): qty 1

## 2014-07-07 MED ORDER — IPRATROPIUM-ALBUTEROL 0.5-2.5 (3) MG/3ML IN SOLN
3.0000 mL | Freq: Four times a day (QID) | RESPIRATORY_TRACT | Status: DC
  Administered 2014-07-07 – 2014-07-10 (×12): 3 mL via RESPIRATORY_TRACT
  Filled 2014-07-07 (×12): qty 3

## 2014-07-07 MED ORDER — ONDANSETRON HCL 4 MG PO TABS: 4.0000 mg | ORAL_TABLET | Freq: Four times a day (QID) | ORAL | Status: DC | PRN

## 2014-07-07 MED ORDER — ALBUTEROL SULFATE (2.5 MG/3ML) 0.083% IN NEBU: 2.5000 mg | INHALATION_SOLUTION | Freq: Four times a day (QID) | RESPIRATORY_TRACT | Status: DC

## 2014-07-07 MED ORDER — CALCIUM CARBONATE-VITAMIN D 250-125 MG-UNIT PO TABS: 1.0000 | ORAL_TABLET | Freq: Every morning | ORAL | Status: DC

## 2014-07-07 MED ORDER — LEVOFLOXACIN IN D5W 750 MG/150ML IV SOLN
750.0000 mg | INTRAVENOUS | Status: DC
  Administered 2014-07-09: 750 mg via INTRAVENOUS
  Filled 2014-07-07: qty 150

## 2014-07-07 MED ORDER — MULTI-VITAMIN/MINERALS PO TABS: 1.0000 | ORAL_TABLET | Freq: Every morning | ORAL | Status: DC

## 2014-07-07 MED ORDER — LEVOFLOXACIN IN D5W 500 MG/100ML IV SOLN
500.0000 mg | Freq: Once | INTRAVENOUS | Status: AC
  Administered 2014-07-07: 500 mg via INTRAVENOUS
  Filled 2014-07-07: qty 100

## 2014-07-07 MED ORDER — ASPIRIN EC 325 MG PO TBEC
325.0000 mg | DELAYED_RELEASE_TABLET | Freq: Every morning | ORAL | Status: DC
  Administered 2014-07-08 – 2014-07-11 (×4): 325 mg via ORAL
  Filled 2014-07-07 (×4): qty 1

## 2014-07-07 MED ORDER — ALPRAZOLAM 0.5 MG PO TABS
1.0000 mg | ORAL_TABLET | Freq: Three times a day (TID) | ORAL | Status: DC | PRN
  Administered 2014-07-08 – 2014-07-09 (×2): 1 mg via ORAL
  Filled 2014-07-07 (×2): qty 2

## 2014-07-07 MED ORDER — NORTRIPTYLINE HCL 25 MG PO CAPS
50.0000 mg | ORAL_CAPSULE | Freq: Every day | ORAL | Status: DC
  Administered 2014-07-07 – 2014-07-10 (×4): 50 mg via ORAL
  Filled 2014-07-07 (×5): qty 2

## 2014-07-07 MED ORDER — OMEGA-3-ACID ETHYL ESTERS 1 G PO CAPS
1.0000 g | ORAL_CAPSULE | Freq: Every morning | ORAL | Status: DC
  Administered 2014-07-08 – 2014-07-11 (×4): 1 g via ORAL
  Filled 2014-07-07 (×4): qty 1

## 2014-07-07 MED ORDER — OMEGA 3 1200 MG PO CAPS: 1200.0000 mg | ORAL_CAPSULE | Freq: Every morning | ORAL | Status: DC

## 2014-07-07 MED ORDER — BUDESONIDE-FORMOTEROL FUMARATE 160-4.5 MCG/ACT IN AERO
2.0000 | INHALATION_SPRAY | Freq: Two times a day (BID) | RESPIRATORY_TRACT | Status: DC
  Administered 2014-07-07 – 2014-07-08 (×2): 2 via RESPIRATORY_TRACT
  Filled 2014-07-07: qty 6

## 2014-07-07 MED ORDER — POTASSIUM CHLORIDE CRYS ER 20 MEQ PO TBCR
20.0000 meq | EXTENDED_RELEASE_TABLET | Freq: Every evening | ORAL | Status: DC
  Administered 2014-07-08 – 2014-07-10 (×3): 20 meq via ORAL
  Filled 2014-07-07 (×5): qty 1

## 2014-07-07 MED ORDER — SODIUM CHLORIDE 0.9 % IV SOLN: INTRAVENOUS | Status: AC

## 2014-07-07 MED ORDER — ASPIRIN-ACETAMINOPHEN-CAFFEINE 250-250-65 MG PO TABS
2.0000 | ORAL_TABLET | Freq: Every day | ORAL | Status: DC | PRN
  Administered 2014-07-08 – 2014-07-09 (×2): 2 via ORAL
  Filled 2014-07-07 (×3): qty 2

## 2014-07-07 MED ORDER — ONDANSETRON HCL 4 MG/2ML IJ SOLN
4.0000 mg | Freq: Four times a day (QID) | INTRAMUSCULAR | Status: DC | PRN
Start: 2014-07-07 — End: 2014-07-11
  Administered 2014-07-10: 4 mg via INTRAVENOUS
  Filled 2014-07-07: qty 2

## 2014-07-07 MED ORDER — ADULT MULTIVITAMIN W/MINERALS CH
1.0000 | ORAL_TABLET | Freq: Every day | ORAL | Status: DC
  Administered 2014-07-08 – 2014-07-11 (×4): 1 via ORAL
  Filled 2014-07-07 (×4): qty 1

## 2014-07-07 MED ORDER — GUAIFENESIN-DM 100-10 MG/5ML PO SYRP: 5.0000 mL | ORAL_SOLUTION | ORAL | Status: DC | PRN

## 2014-07-07 MED ORDER — IPRATROPIUM BROMIDE 0.02 % IN SOLN: 0.5000 mg | Freq: Four times a day (QID) | RESPIRATORY_TRACT | Status: DC

## 2014-07-07 MED ORDER — PANTOPRAZOLE SODIUM 40 MG PO TBEC
40.0000 mg | DELAYED_RELEASE_TABLET | Freq: Every morning | ORAL | Status: DC
  Administered 2014-07-08 – 2014-07-11 (×4): 40 mg via ORAL
  Filled 2014-07-07 (×3): qty 1

## 2014-07-07 MED ORDER — SODIUM CHLORIDE 0.9 % IJ SOLN
3.0000 mL | Freq: Two times a day (BID) | INTRAMUSCULAR | Status: DC
  Administered 2014-07-07 – 2014-07-11 (×8): 3 mL via INTRAVENOUS

## 2014-07-07 MED ORDER — LACTULOSE 10 GM/15ML PO SOLN
20.0000 g | Freq: Three times a day (TID) | ORAL | Status: DC | PRN
  Administered 2014-07-08 (×2): 20 g via ORAL
  Filled 2014-07-07 (×2): qty 30

## 2014-07-07 NOTE — ED Notes (Signed)
To ED via [place] from home, with c/o increasing chest pain, shortness of breath and weakness for 3 days. Hx-- Breast CA Right Breast-- in remission per pt., Chest pain over left breast hurts with moving and palpation, shortness of breath-- coughing productive for whitish sputum-- wears O2 at home, Alert/oriented x 4,

## 2014-07-07 NOTE — ED Notes (Signed)
Dr. Algis Liming at bedside-- requested Blood cultures x 2 prior to starting Levoquin IV.

## 2014-07-07 NOTE — ED Notes (Signed)
Report given to Otila Kluver, RN on 3E.

## 2014-07-07 NOTE — H&P (Signed)
History and Physical  [name] XWR:[id number] DOB: [birth year] DOA: 07/07/2014  Referring physician: Dr. Jola Schmidt, EDP PCP: Scarlette Calico, MD  Outpatient Specialists:  1. Cardiology: Dr. Debara Pickett. 2. Pulmonology: Dr. Lake Bells. 3. Oncology: Dr. Jana Hakim.  Chief Complaint: Cough, chest pain, difficulty breathing and fevers.  HPI: [name] is a 68 y.o. female with extensive past medical history-COPD, chronic respiratory failure on 4 L per minute oxygen at bedtime, MI, CAD status post stents, chronic diastolic CHF, hypertension, migraine, anxiety, right breast cancer status post lumpectomy and chemotherapy, dyslipidemia, several abdominal surgeries, occasional tobacco use, present to the ED with above complaints. She was in usual state of health until 4-5 days ago. Since then she's been experiencing nightly drenching sweats and high fevers but has not checked her temperatures. In the daytime she's been feeling weak and mostly lying in bed. She complains of new onset cough that is nonproductive. No sickly contacts. Last night her symptoms seemed to get worse with worsening cough, dyspnea, left-sided chest pain that is worse with with chest wall movements, coughing or touching. She denies wheezing. She has not taken her flu shot this season. In the ED, afebrile, oxygen saturation in the low 90s, creatinine 1.26, WBC 14.4 and chest x-ray suggest multilobar pneumonia. She was started on IV levofloxacin (allergic to ceftriaxone) and hospitalist admission requested.   Review of Systems: All systems reviewed and apart from history of presenting illness, are negative.  Past Medical History  Diagnosis Date  . Angina   . Heart murmur   . Depressed   . Coronary artery disease 2006    2 stents w/previous MI  . Hypertension   . Migraine   . Anxiety   . Myocardial infarction 2006    NSTEMI  . COPD (chronic obstructive pulmonary disease)     oxygen-dependent 4LPM [place]  . Moderate to severe  pulmonary hypertension   . Diastolic CHF   . Cancer of breast dx'd 2008    RT Breast  . Aortic stenosis     mild  . Dyslipidemia   . NSVT (nonsustained ventricular tachycardia)     h/o  . History of nuclear stress test 08/03/2011    attenuation at apex - no perfusion defects   . Abnormal LFTs 08/02/2011  . Acute liver failure 06/19/2013  . Acute renal failure 06/19/2013  . Acute respiratory failure 06/19/2013  . Altered mental status 08/01/2011  . Anxiety state, unspecified 12/03/2013  . Breast cancer of upper-outer quadrant of left female breast 08/21/2013  . CAD (coronary artery disease) of artery bypass graft 11/06/2013  . CAD (coronary artery disease), MI R/O 08/01/2011    PCI in 2006 (bare metal stent, unknown artery) - [place]   . CHF,  acute diastolic, BNP 4k on admissio 08/01/2011  . Chronic respiratory failure 02/02/2012  . Compression fracture of L1 lumbar vertebra 02/02/2012  . Mild aortic stenosis 08/01/2011    AVA 1.69 cm2 (06/02/2011)   . Hyponatremia 01/31/2012  . Hyperkalemia, on ACE prior to admission 11/11/2011  . HTN (hypertension) 11/06/2013  . History of breast cancer in female 06/23/2013  . 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 Breast lumpectomy Right 2008  . Cardiac catheterization  2006  . Cholecystectomy  2011  . Abdominal surgery  2009  . Transthoracic echocardiogram  11/12/2011    EF 19-14%, normal systolic function, grade 1 diastolic dysfunction; ventricular septal flattening (D-sign); mild AS; trace-mild MR;  LA mildly dilated; RV mod dilated; RA mod dilated; severe pulm HTN; elevated CVP   Social History:  reports that she has been smoking Cigarettes.  She has a 11.75 pack-year smoking history. She has never used smokeless tobacco. She reports that she does not drink alcohol or use illicit drugs. Widowed. Independent of activities of daily living. States that she smokes one cigarette once in a while and the last time was  a week ago.  Allergies  Allergen Reactions  . Hydroxyzine Shortness Of Breath and Other (See Comments)    Pt states med make her light headed, get sob sxs  . Ceftriaxone Other (See Comments)    "Blow up like a balloon"  . Lexapro [Escitalopram] Other (See Comments)    Pt states med make her dizzy    Family History  Problem Relation Age of Onset  . Schizophrenia Sister   . Alzheimer's disease Mother   . Hyperlipidemia Brother   . Heart disease Sister   . Diabetes Sister   . Cancer Neg Hx   . Stroke Neg Hx   . COPD Neg Hx   . Depression Neg Hx   . Drug abuse Neg Hx   . Early death Neg Hx   . Hypertension Neg Hx   . Kidney disease Neg Hx     Prior to Admission medications   Medication Sig Start Date End Date Taking? Authorizing Provider  albuterol (PROVENTIL HFA;VENTOLIN HFA) 108 (90 BASE) MCG/ACT inhaler Inhale 2 puffs into the lungs every 4 (four) hours as needed for shortness of breath. Shortness of breath and wheezing  Patient is using Pro-Air brand   Yes Historical Provider, MD  albuterol (PROVENTIL) (5 MG/ML) 0.5% nebulizer solution Take 2.5 mg by nebulization every 2 (two) hours as needed for wheezing or shortness of breath. 06/25/13  Yes Allie Bossier, MD  ALPRAZolam Duanne Moron) 1 MG tablet Take 1 tablet (1 mg total) by mouth 3 (three) times daily as needed for sleep or anxiety. 03/04/14  Yes [name], MD  aspirin 325 MG EC tablet Take 325 mg by mouth every morning.    Yes Historical Provider, MD  aspirin-acetaminophen-caffeine (EXCEDRIN MIGRAINE) [phone] MG per tablet Take 2 tablets by mouth daily as needed for headache.   Yes Historical Provider, MD  budesonide-formoterol (SYMBICORT) 160-4.5 MCG/ACT inhaler Inhale 2 puffs into the lungs 2 (two) times daily. 12/03/13  Yes Juanito Doom, MD  butalbital-acetaminophen-caffeine [facility]) [phone] MG per tablet Take 1-2 pills as needed for headache 04/10/14  Yes [name], MD  calcium-vitamin D (OSCAL WITH D) 250-125  MG-UNIT per tablet Take 1 tablet by mouth every morning.    Yes Historical Provider, MD  denosumab (XGEVA) 120 MG/1.7ML SOLN injection Inject 120 mg into the skin once.   Yes Historical Provider, MD  diclofenac sodium (VOLTAREN) 1 % GEL Apply 1 application topically daily as needed (apply to hands). Hand pain   Yes Historical Provider, MD  furosemide (LASIX) 40 MG tablet Take 40 mg by mouth 2 (two) times daily.   Yes Historical Provider, MD  ipratropium (ATROVENT HFA) 17 MCG/ACT inhaler Inhale 2 puffs into the lungs 3 (three) times daily.   Yes Historical Provider, MD  lactulose, encephalopathy, (CHRONULAC) 10 GM/15ML SOLN Take 20 g by mouth 3 (three) times daily as needed (Constipation).  04/10/14  Yes [name], MD  montelukast (SINGULAIR) 10 MG tablet Take 10 mg by mouth at bedtime.   Yes Historical Provider, MD  Multiple Vitamins-Minerals (  MULTIVITAMIN WITH MINERALS) tablet Take 1 tablet by mouth every morning.    Yes Historical Provider, MD  nortriptyline (PAMELOR) 25 MG capsule Take 2 capsules (50 mg total) by mouth at bedtime. 08/31/13  Yes Adam Melvern Sample, DO  Omega 3 1200 MG CAPS Take 1,200 mg by mouth every morning.    Yes Historical Provider, MD  ondansetron (ZOFRAN) 8 MG tablet Take 1 tablet (8 mg total) by mouth 2 (two) times daily as needed for nausea or vomiting. 04/10/14  Yes [name], MD  pantoprazole (PROTONIX) 40 MG tablet Take 40 mg by mouth every morning.   Yes Historical Provider, MD  Pitavastatin Calcium (LIVALO) 2 MG TABS Take 2 mg by mouth every evening.    Yes Historical Provider, MD  potassium chloride SA (K-DUR,KLOR-CON) 20 MEQ tablet Take 20 mEq by mouth every evening.    Yes Historical Provider, MD  OXYGEN-HELIUM IN Inhale 3 L into the lungs as needed.    Historical Provider, MD   Physical Exam: Filed Vitals:   07/07/14 3825 07/07/14 0822 07/07/14 1023 07/07/14 1139  BP:   117/91   Pulse:   103   Temp:   98.8 F (37.1 C)   TempSrc:      Resp:   28     Height:      Weight:      SpO2: 92% 94% 90% 91%     General exam: Small built and moderately nourished pleasant middle-aged female sitting comfortably propped up on the gurney without distress.  Head, eyes and ENT: Nontraumatic and normocephalic. Pupils equally reacting to light and accommodation. Oral mucosa slightly dry.  Neck: Supple. No JVD, carotid bruit or thyromegaly.  Lymphatics: No lymphadenopathy.  Respiratory system: Diminished breath sounds bilaterally with scattered bilateral few expiratory rhonchi and crackles. No increased work of breathing. Able to speak in full sentences.  Cardiovascular system: S1 and S2 heard, RRR. No JVD, murmurs, gallops, clicks or pedal edema.  Gastrointestinal system: Abdomen is nondistended, soft and nontender. Normal bowel sounds heard. No organomegaly or masses appreciated. Multiple laparotomy scars.  Central nervous system: Alert and oriented. No focal neurological deficits.  Extremities: Symmetric 5 x 5 power. Peripheral pulses symmetrically felt.   Skin: No rashes or acute findings.  Musculoskeletal system: Negative exam.  Psychiatry: Pleasant and cooperative.    Labs on Admission:  Basic Metabolic Panel:  Recent Labs Lab 07/07/14 0855  NA 137  K 4.1  CL 95*  CO2 30  GLUCOSE 89  BUN 15  CREATININE 1.26*  CALCIUM 9.8   Liver Function Tests: No results found for this basename: AST, ALT, ALKPHOS, BILITOT, PROT, ALBUMIN,  in the last 168 hours No results found for this basename: LIPASE, AMYLASE,  in the last 168 hours No results found for this basename: AMMONIA,  in the last 168 hours CBC:  Recent Labs Lab 07/07/14 0855  WBC 14.4*  HGB 13.6  HCT 41.2  MCV 97.6  PLT 252   Cardiac Enzymes:  Recent Labs Lab 07/07/14 0855  TROPONINI <0.30    BNP (last 3 results)  Recent Labs  07/07/14 0855  PROBNP 293.8*   CBG: No results found for this basename: GLUCAP,  in the last 168 hours  Radiological Exams  on Admission: Dg Chest 2 View  07/07/2014   CLINICAL DATA:  Left lower chest pain. Shortness of breath. Generalize weakness. Productive cough. Six day history of the symptoms. Prior history of right breast lumpectomy in 2008.  EXAM: CHEST  2 VIEW  COMPARISON:  CT chest 06/13/2014. Two-view chest x-ray 12/03/2013, 11/05/2013.  FINDINGS: Cardiac silhouette mildly enlarged but stable. Thoracic aorta mildly atherosclerotic, unchanged. Prominent central pulmonary arteries, unchanged. Dense airspace consolidation in the right upper lobe and lingula. Patchy airspace opacities elsewhere in the lower lobes and right middle lobe. Pulmonary vascularity normal without evidence of pulmonary edema. Small bilateral pleural effusions. Mild degenerative changes involving the thoracic spine. Surgical clips in the right axilla from prior node dissection.  IMPRESSION: Multilobar pneumonia, the densest consolidation in the lingula and inferior right upper lobe. Small bilateral pleural effusions. Stable mild cardiomegaly without pulmonary edema.  Follow-up two-view chest x-ray after completion of antibiotic therapy is recommended to confirm complete resolution of the dense consolidation in the lingula and inferior right upper lobe.   Electronically Signed   By: Evangeline Dakin M.D.   On: 07/07/2014 09:54    EKG: Independently reviewed. Sinus tachycardia 101 beats per minute,? RBBB and no acute changes.  Assessment/Plan Principal Problem:   CAP (community acquired pneumonia) Active Problems:   COPD (chronic obstructive pulmonary disease)   Hyperlipidemia   Tobacco abuse   Chronic diastolic CHF (congestive heart failure)   Chronic respiratory failure   HTN (hypertension)   Gastroesophageal reflux disease without esophagitis   1. Multilobar community acquired pneumonia: Admit to telemetry. Blood cultures x2. Influenza panel PCR. Urine Legionella and streptococcal antigen. Sputum culture. Continue IV levofloxacin.  Supportive treatment with oxygen, mucolytic as and bronchodilators. Left-sided chest pain is either musculoskeletal versus pleuritic secondary to pneumonia. Troponin x1 negative. Patient will need followup chest x-ray in 4-6 weeks to ensure resolution of pneumonia findings especially given her history of breast cancer. 2. Early sepsis, present on admission: Secondary to pneumonia. Patient had heart rate 101, RR >20, WBC 14K. Brief gentle IV fluids and management as per problem #1. 3. Chronic respiratory failure: Secondary to COPD. Continue oxygen. 4. COPD: Minimal bronchospasm but not enough to warrant steroids. Monitor closely. Continue home inhalers, oxygen and bronchodilator nebulizations. 5. Chronic diastolic CHF: Clinically appear slightly dry. Hold Lasix for today. Monitor closely. 6. Hypertension: Controlled. 7. GERD: Continue PPIs. 8. History of anxiety: 9. History of migraine: When necessary pain medications 10. History of breast cancer: As stated above, will need followup of chest x-ray to ensure resolution of pneumonia findings and persist, will need CT chest to rule out metastasis.     Code Status: Full  Family Communication: None at bedside  Disposition Plan: Home when medically stable   Time spent: 32 minutes  Muaz Shorey, MD, FACP, FHM. [facility] Pager [phone]  If 7PM-7AM, please contact night-coverage [email or web address] Password [email or web address] 07/07/2014, 12:24 PM

## 2014-07-07 NOTE — ED Notes (Signed)
No change after resp treatment.

## 2014-07-07 NOTE — Progress Notes (Signed)
ANTIBIOTIC CONSULT NOTE - INITIAL  Pharmacy Consult for Levofloxacin Indication: CAP  Allergies  Allergen Reactions  . Hydroxyzine Shortness Of Breath and Other (See Comments)    Pt states med make her light headed, get sob sxs  . Ceftriaxone Other (See Comments)    "Blow up like a balloon"  . Lexapro [Escitalopram] Other (See Comments)    Pt states med make her dizzy    Patient Measurements: Height: 5\' 3"  (160 cm) Weight: 128 lb 12 oz (58.4 kg) IBW/kg (Calculated) : 52.4   Vital Signs: Temp: 98.8 F (37.1 C) (10/11 1454) Temp Source: Oral (10/11 1454) BP: 96/59 mmHg (10/11 1454) Pulse Rate: 89 (10/11 1454) Intake/Output from previous day:   Intake/Output from this shift:    Labs:  Recent Labs  07/07/14 0855  WBC 14.4*  HGB 13.6  PLT 252  CREATININE 1.26*   Estimated Creatinine Clearance: 35.3 ml/min (by C-G formula based on Cr of 1.26). No results found for this basename: VANCOTROUGH, VANCOPEAK, VANCORANDOM, GENTTROUGH, GENTPEAK, GENTRANDOM, TOBRATROUGH, TOBRAPEAK, TOBRARND, AMIKACINPEAK, AMIKACINTROU, AMIKACIN,  in the last 72 hours   Microbiology: No results found for this or any previous visit (from the past 720 hour(s)).  Medical History: Past Medical History  Diagnosis Date  . Angina   . Heart murmur   . Depressed   . Coronary artery disease 2006    2 stents w/previous MI  . Hypertension   . Migraine   . Anxiety   . Myocardial infarction 2006    NSTEMI  . COPD (chronic obstructive pulmonary disease)     oxygen-dependent 4LPM [place]  . Moderate to severe pulmonary hypertension   . Diastolic CHF   . Cancer of breast dx'd 2008    RT Breast  . Aortic stenosis     mild  . Dyslipidemia   . NSVT (nonsustained ventricular tachycardia)     h/o  . History of nuclear stress test 08/03/2011    attenuation at apex - no perfusion defects   . Abnormal LFTs 08/02/2011  . Acute liver failure 06/19/2013  . Acute renal failure 06/19/2013  . Acute respiratory  failure 06/19/2013  . Altered mental status 08/01/2011  . Anxiety state, unspecified 12/03/2013  . Breast cancer of upper-outer quadrant of left female breast 08/21/2013  . CAD (coronary artery disease) of artery bypass graft 11/06/2013  . CAD (coronary artery disease), MI R/O 08/01/2011    PCI in 2006 (bare metal stent, unknown artery) - [place]   . CHF,  acute diastolic, BNP 4k on admissio 08/01/2011  . Chronic respiratory failure 02/02/2012  . Compression fracture of L1 lumbar vertebra 02/02/2012  . Mild aortic stenosis 08/01/2011    AVA 1.69 cm2 (06/02/2011)   . Hyponatremia 01/31/2012  . Hyperkalemia, on ACE prior to admission 11/11/2011  . HTN (hypertension) 11/06/2013  . History of breast cancer in female 06/23/2013  . 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 albuterol (PROVENTIL HFA;VENTOLIN HFA) 108 (90 BASE) MCG/ACT inhaler Inhale 2 puffs into the lungs every 4 (four) hours as needed for shortness of breath. Shortness of breath and wheezing  Patient is using Pro-Air brand      . albuterol (PROVENTIL) (5 MG/ML) 0.5% nebulizer solution Take 2.5 mg by nebulization every 2 (two) hours as needed for wheezing or shortness of breath.      . ALPRAZolam (XANAX) 1 MG tablet Take 1 tablet (1 mg total) by mouth  3 (three) times daily as needed for sleep or anxiety.  90 tablet  3  . aspirin 325 MG EC tablet Take 325 mg by mouth every morning.       Marland Kitchen aspirin-acetaminophen-caffeine (EXCEDRIN MIGRAINE) 250-250-65 MG per tablet Take 2 tablets by mouth daily as needed for headache.      . budesonide-formoterol (SYMBICORT) 160-4.5 MCG/ACT inhaler Inhale 2 puffs into the lungs 2 (two) times daily.  3 Inhaler  3  . butalbital-acetaminophen-caffeine (FIORICET) 50-325-40 MG per tablet Take 1-2 pills as needed for headache  30 tablet  5  . calcium-vitamin D (OSCAL WITH D) 250-125 MG-UNIT per tablet Take 1 tablet by mouth every morning.       .  denosumab (XGEVA) 120 MG/1.7ML SOLN injection Inject 120 mg into the skin once.      . diclofenac sodium (VOLTAREN) 1 % GEL Apply 1 application topically daily as needed (apply to hands). Hand pain      . furosemide (LASIX) 40 MG tablet Take 40 mg by mouth 2 (two) times daily.      Marland Kitchen ipratropium (ATROVENT HFA) 17 MCG/ACT inhaler Inhale 2 puffs into the lungs 3 (three) times daily.      Marland Kitchen lactulose, encephalopathy, (CHRONULAC) 10 GM/15ML SOLN Take 20 g by mouth 3 (three) times daily as needed (Constipation).       . montelukast (SINGULAIR) 10 MG tablet Take 10 mg by mouth at bedtime.      . Multiple Vitamins-Minerals (MULTIVITAMIN WITH MINERALS) tablet Take 1 tablet by mouth every morning.       . nortriptyline (PAMELOR) 25 MG capsule Take 2 capsules (50 mg total) by mouth at bedtime.  60 capsule  3  . Omega 3 1200 MG CAPS Take 1,200 mg by mouth every morning.       . ondansetron (ZOFRAN) 8 MG tablet Take 1 tablet (8 mg total) by mouth 2 (two) times daily as needed for nausea or vomiting.  30 tablet  11  . pantoprazole (PROTONIX) 40 MG tablet Take 40 mg by mouth every morning.      . Pitavastatin Calcium (LIVALO) 2 MG TABS Take 2 mg by mouth every evening.       . potassium chloride SA (K-DUR,KLOR-CON) 20 MEQ tablet Take 20 mEq by mouth every evening.       . OXYGEN-HELIUM IN Inhale 3 L into the lungs as needed.       Scheduled:  . albuterol  2.5 mg Nebulization Q6H  . [START ON 07/08/2014] aspirin  325 mg Oral q morning - 10a  . budesonide-formoterol  2 puff Inhalation BID  . [START ON 07/08/2014] calcium-vitamin D  1 tablet Oral q morning - 10a  . enoxaparin (LOVENOX) injection  40 mg Subcutaneous Q24H  . [START ON 07/08/2014] furosemide  40 mg Oral BID  . guaiFENesin  600 mg Oral BID  . ipratropium  0.5 mg Nebulization Q6H  . montelukast  10 mg Oral QHS  . [START ON 07/08/2014] multivitamin with minerals  1 tablet Oral q morning - 10a  . nortriptyline  50 mg Oral QHS  . [START ON  07/08/2014] Omega 3  1,200 mg Oral q morning - 10a  . [START ON 07/08/2014] pantoprazole  40 mg Oral q morning - 10a  . [START ON 07/08/2014] potassium chloride SA  20 mEq Oral QPM  . pravastatin  40 mg Oral q1800  . sodium chloride  3 mL Intravenous Q12H   Assessment: 68 y.o female  with WBC 14.4 and chest x-ray suggest multilobar pneumonia. Received IV levofloxacin 500mg  x 1 in the ED today and pharmacy consulted to dose levofloxacin for CAP.  Her SCr is 1.26, estimated CrCl is ~ 35 ml/min.  Afebrile, WBC 14.4K   Goal of Therapy:  Eradication of infection and appropriate dosing for patient's renal function.   Plan:  Levofloxacin 750 mg IV q48h-next dose due on Tues 10/13 @ 10:00 AM.  Monitor renal function and adjust dose as needed.  Nicole Cella, RPh Clinical Pharmacist Pager: [phone] 07/07/2014,3:02 PM

## 2014-07-07 NOTE — ED Provider Notes (Signed)
CSN: [id number]     Arrival date & time 07/07/14  0998 History   First MD Initiated Contact with Patient 07/07/14 [id number]     Chief Complaint  Patient presents with  . Chest Pain  . Shortness of Breath      The history is provided by the patient and medical records.   patient reports worsening shortness of breath and productive cough for the past several days with some new left-sided chest discomfort that is not pleuritic.  No abdominal pain.  Denies nausea vomiting.  Patient has a history of COPD on 3 L home O2.  She reports trying breathing treatments without improvement in her symptoms.  No fever or chills at home.  No back pain.  Symptoms are moderate in severity   Past Medical History  Diagnosis Date  . Angina   . Heart murmur   . Depressed   . Coronary artery disease 2006    2 stents w/previous MI  . Hypertension   . Migraine   . Anxiety   . Myocardial infarction 2006    NSTEMI  . COPD (chronic obstructive pulmonary disease)     oxygen-dependent 4LPM [place]  . Moderate to severe pulmonary hypertension   . Diastolic CHF   . Cancer of breast dx'd 2008    RT Breast  . Aortic stenosis     mild  . Dyslipidemia   . NSVT (nonsustained ventricular tachycardia)     h/o  . History of nuclear stress test 08/03/2011    attenuation at apex - no perfusion defects   . Abnormal LFTs 08/02/2011  . Acute liver failure 06/19/2013  . Acute renal failure 06/19/2013  . Acute respiratory failure 06/19/2013  . Altered mental status 08/01/2011  . Anxiety state, unspecified 12/03/2013  . Breast cancer of upper-outer quadrant of left female breast 08/21/2013  . CAD (coronary artery disease) of artery bypass graft 11/06/2013  . CAD (coronary artery disease), MI R/O 08/01/2011    PCI in 2006 (bare metal stent, unknown artery) - [place]   . CHF,  acute diastolic, BNP 4k on admissio 08/01/2011  . Chronic respiratory failure 02/02/2012  . Compression fracture of L1 lumbar vertebra 02/02/2012  . Mild aortic stenosis  08/01/2011    AVA 1.69 cm2 (06/02/2011)   . Hyponatremia 01/31/2012  . Hyperkalemia, on ACE prior to admission 11/11/2011  . HTN (hypertension) 11/06/2013  . History of breast cancer in female 06/23/2013  . 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 Breast lumpectomy Right 2008  . Cardiac catheterization  2006  . Cholecystectomy  2011  . Abdominal surgery  2009  . Transthoracic echocardiogram  11/12/2011    EF 50-53%, normal systolic function, grade 1 diastolic dysfunction; ventricular septal flattening (D-sign); mild AS; trace-mild MR; LA mildly dilated; RV mod dilated; RA mod dilated; severe pulm HTN; elevated CVP   Family History  Problem Relation Age of Onset  . Schizophrenia Sister   . Alzheimer's disease Mother   . Hyperlipidemia Brother   . Heart disease Sister   . Diabetes Sister   . Cancer Neg Hx   . Stroke Neg Hx   . COPD Neg Hx   . Depression Neg Hx   . Drug abuse Neg Hx   . Early death Neg Hx   . Hypertension Neg Hx   . Kidney disease Neg Hx    History  Substance Use Topics  . Smoking status: Current Some Day Smoker --  0.25 packs/day for 47 years    Types: Cigarettes  . Smokeless tobacco: Never Used     Comment: smokes when anxious  . Alcohol Use: No     Comment: wine with dinner occasionally   OB History   Grav Para Term Preterm Abortions TAB SAB Ect Mult Living                 Review of Systems  Respiratory: Positive for shortness of breath.   Cardiovascular: Positive for chest pain.  All other systems reviewed and are negative.     Allergies  Hydroxyzine; Ceftriaxone; and Lexapro  Home Medications   Prior to Admission medications   Medication Sig Start Date End Date Taking? Authorizing Provider  albuterol (PROVENTIL HFA;VENTOLIN HFA) 108 (90 BASE) MCG/ACT inhaler Inhale 2 puffs into the lungs every 4 (four) hours as needed for shortness of breath. Shortness of breath and wheezing  Patient is using  Pro-Air brand    Historical Provider, MD  albuterol (PROVENTIL) (5 MG/ML) 0.5% nebulizer solution Take 2.5 mg by nebulization every 2 (two) hours as needed for wheezing or shortness of breath. 06/25/13   Allie Bossier, MD  ALPRAZolam Duanne Moron) 1 MG tablet Take 1 tablet (1 mg total) by mouth 3 (three) times daily as needed for sleep or anxiety. 03/04/14   [name], MD  aspirin 325 MG EC tablet Take 325 mg by mouth daily.    Historical Provider, MD  bisacodyl (DULCOLAX) 5 MG EC tablet Take 10 mg by mouth 2 (two) times daily as needed (for constipation).    Historical Provider, MD  budesonide-formoterol (SYMBICORT) 160-4.5 MCG/ACT inhaler Inhale 2 puffs into the lungs 2 (two) times daily. 12/03/13   Juanito Doom, MD  butalbital-acetaminophen-caffeine [facility]) [phone] MG per tablet Take 1-2 pills as needed for headache 04/10/14   [name], MD  calcium-vitamin D (OSCAL WITH D) 250-125 MG-UNIT per tablet Take 1 tablet by mouth daily.    Historical Provider, MD  diclofenac sodium (VOLTAREN) 1 % GEL Apply 1 application topically daily as needed (apply to hands). Hand pain    Historical Provider, MD  lactulose, encephalopathy, (CHRONULAC) 10 GM/15ML SOLN Take 20 g by mouth 3 (three) times daily as needed. 04/10/14   [name], MD  Multiple Vitamins-Minerals (MULTIVITAMIN WITH MINERALS) tablet Take 1 tablet by mouth daily.    Historical Provider, MD  nortriptyline (PAMELOR) 25 MG capsule Take 2 capsules (50 mg total) by mouth at bedtime. 08/31/13   Dudley Major, DO  Omega 3 1200 MG CAPS Take 1,200 mg by mouth daily.     Historical Provider, MD  ondansetron (ZOFRAN) 8 MG tablet Take 1 tablet (8 mg total) by mouth 2 (two) times daily as needed for nausea or vomiting. 04/10/14   [name], MD  OXYGEN-HELIUM IN Inhale 3 L into the lungs as needed.    Historical Provider, MD  pantoprazole (PROTONIX) 40 MG tablet Take 1 tablet (40 mg total) by mouth daily. 04/10/14   [name], MD   Pitavastatin Calcium (LIVALO) 2 MG TABS Take by mouth.    Historical Provider, MD   BP 138/92  Pulse 104  Temp(Src) 98.4 F (36.9 C) (Oral)  Resp 22  Ht 5\' 3"  (1.6 m)  Wt 117 lb (53.071 kg)  BMI 20.73 kg/m2  SpO2 94% Physical Exam  Nursing note and vitals reviewed. Constitutional: She is oriented to person, place, and time. She appears well-developed and well-nourished. No distress.  HENT:  Head: Normocephalic and atraumatic.  Eyes: EOM are normal.  Neck: Normal range of motion.  Cardiovascular: Normal rate, regular rhythm and normal heart sounds.   Pulmonary/Chest: Effort normal and breath sounds normal.  Abdominal: Soft. She exhibits no distension. There is no tenderness.  Musculoskeletal: Normal range of motion.  Neurological: She is alert and oriented to person, place, and time.  Skin: Skin is warm and dry.  Psychiatric: She has a normal mood and affect. Judgment normal.    ED Course  Procedures (including critical care time) Labs Review Labs Reviewed  CBC - Abnormal; Notable for the following:    WBC 14.4 (*)    All other components within normal limits  BASIC METABOLIC PANEL - Abnormal; Notable for the following:    Chloride 95 (*)    Creatinine, Ser 1.26 (*)    GFR calc non Af Amer 43 (*)    GFR calc Af Amer 50 (*)    All other components within normal limits  PRO B NATRIURETIC PEPTIDE - Abnormal; Notable for the following:    Pro B Natriuretic peptide (BNP) 293.8 (*)    All other components within normal limits  TROPONIN I  I-STAT TROPOININ, ED    Imaging Review Dg Chest 2 View  07/07/2014   CLINICAL DATA:  Left lower chest pain. Shortness of breath. Generalize weakness. Productive cough. Six day history of the symptoms. Prior history of right breast lumpectomy in 2008.  EXAM: CHEST  2 VIEW  COMPARISON:  CT chest 06/13/2014. Two-view chest x-ray 12/03/2013, 11/05/2013.  FINDINGS: Cardiac silhouette mildly enlarged but stable. Thoracic aorta mildly  atherosclerotic, unchanged. Prominent central pulmonary arteries, unchanged. Dense airspace consolidation in the right upper lobe and lingula. Patchy airspace opacities elsewhere in the lower lobes and right middle lobe. Pulmonary vascularity normal without evidence of pulmonary edema. Small bilateral pleural effusions. Mild degenerative changes involving the thoracic spine. Surgical clips in the right axilla from prior node dissection.  IMPRESSION: Multilobar pneumonia, the densest consolidation in the lingula and inferior right upper lobe. Small bilateral pleural effusions. Stable mild cardiomegaly without pulmonary edema.  Follow-up two-view chest x-ray after completion of antibiotic therapy is recommended to confirm complete resolution of the dense consolidation in the lingula and inferior right upper lobe.   Electronically Signed   By: Evangeline Dakin M.D.   On: 07/07/2014 09:54  I personally reviewed the imaging tests through PACS system I reviewed available ER/hospitalization records through the EMR    EKG Interpretation   Date/Time:  Sunday July 07 2014 08:03:08 EDT Ventricular Rate:  101 PR Interval:  165 QRS Duration: 90 QT Interval:  312 QTC Calculation: 404 R Axis:   53 Text Interpretation:  Sinus tachycardia Probable left atrial enlargement  Probable anteroseptal infarct, old No significant change was found  Confirmed by Alivya Wegman  MD, Koal Eslinger ([id number]) on 07/07/2014 8:26:53 AM      MDM   Final diagnoses:  None    10:49 AM Patient with oxygen saturations of 86% on her home 3 L.  Patient be turned up to 5 L nasal cannula.  Multilobar pneumonia.  Admit to the hospital.  Allergic to ceftriaxone and therefore IV Levaquin was given.    Hoy Morn, MD 07/07/14 1050

## 2014-07-08 DIAGNOSIS — I10 Essential (primary) hypertension: Secondary | ICD-10-CM

## 2014-07-08 DIAGNOSIS — Z720 Tobacco use: Secondary | ICD-10-CM

## 2014-07-08 DIAGNOSIS — E785 Hyperlipidemia, unspecified: Secondary | ICD-10-CM

## 2014-07-08 DIAGNOSIS — J9611 Chronic respiratory failure with hypoxia: Secondary | ICD-10-CM

## 2014-07-08 DIAGNOSIS — J189 Pneumonia, unspecified organism: Secondary | ICD-10-CM

## 2014-07-08 DIAGNOSIS — F411 Generalized anxiety disorder: Secondary | ICD-10-CM

## 2014-07-08 DIAGNOSIS — K219 Gastro-esophageal reflux disease without esophagitis: Secondary | ICD-10-CM

## 2014-07-08 DIAGNOSIS — I5032 Chronic diastolic (congestive) heart failure: Secondary | ICD-10-CM

## 2014-07-08 LAB — CBC
HCT: 35.5 % — ABNORMAL LOW (ref 36.0–46.0)
Hemoglobin: 11.8 g/dL — ABNORMAL LOW (ref 12.0–15.0)
MCH: 31.9 pg (ref 26.0–34.0)
MCHC: 33.2 g/dL (ref 30.0–36.0)
MCV: 95.9 fL (ref 78.0–100.0)
Platelets: 240 10*3/uL (ref 150–400)
RBC: 3.7 MIL/uL — ABNORMAL LOW (ref 3.87–5.11)
RDW: 12.8 % (ref 11.5–15.5)
WBC: 10.1 10*3/uL (ref 4.0–10.5)

## 2014-07-08 LAB — EXPECTORATED SPUTUM ASSESSMENT W GRAM STAIN, RFLX TO RESP C

## 2014-07-08 LAB — BASIC METABOLIC PANEL
Anion gap: 11 (ref 5–15)
BUN: 12 mg/dL (ref 6–23)
CO2: 30 mEq/L (ref 19–32)
Calcium: 9.9 mg/dL (ref 8.4–10.5)
Chloride: 98 mEq/L (ref 96–112)
Creatinine, Ser: 0.93 mg/dL (ref 0.50–1.10)
GFR calc Af Amer: 72 mL/min — ABNORMAL LOW (ref >90)
GFR calc non Af Amer: 62 mL/min — ABNORMAL LOW (ref >90)
Glucose, Bld: 100 mg/dL — ABNORMAL HIGH (ref 70–99)
Potassium: 3.7 mEq/L (ref 3.7–5.3)
Sodium: 139 mEq/L (ref 137–147)

## 2014-07-08 LAB — STREP PNEUMONIAE URINARY ANTIGEN: Strep Pneumo Urinary Antigen: NEGATIVE

## 2014-07-08 LAB — HIV ANTIBODY (ROUTINE TESTING W REFLEX): HIV 1&2 Ab, 4th Generation: NONREACTIVE

## 2014-07-08 MED ORDER — LACTULOSE 10 GM/15ML PO SOLN
20.0000 g | Freq: Three times a day (TID) | ORAL | Status: DC
  Administered 2014-07-08 – 2014-07-10 (×6): 20 g via ORAL
  Filled 2014-07-08 (×10): qty 30

## 2014-07-08 MED ORDER — BUDESONIDE 0.25 MG/2ML IN SUSP
0.2500 mg | Freq: Two times a day (BID) | RESPIRATORY_TRACT | Status: DC
  Administered 2014-07-08 – 2014-07-10 (×4): 0.25 mg via RESPIRATORY_TRACT
  Filled 2014-07-08 (×10): qty 2

## 2014-07-08 MED ORDER — INFLUENZA VAC SPLIT QUAD 0.5 ML IM SUSY
0.5000 mL | PREFILLED_SYRINGE | INTRAMUSCULAR | Status: AC
  Administered 2014-07-09: 0.5 mL via INTRAMUSCULAR
  Filled 2014-07-08: qty 0.5

## 2014-07-08 MED ORDER — METHYLPREDNISOLONE SODIUM SUCC 40 MG IJ SOLR
40.0000 mg | Freq: Two times a day (BID) | INTRAMUSCULAR | Status: DC
  Administered 2014-07-08 – 2014-07-10 (×4): 40 mg via INTRAVENOUS
  Filled 2014-07-08 (×6): qty 1

## 2014-07-08 NOTE — Progress Notes (Addendum)
[facility] PROGRESS NOTE  [name] QIH:[id number] DOB: 15-Mar-1946 DOA: 07/07/2014 PCP: Scarlette Calico, MD  Assessment/Plan: 1-acute on chronic resp failure: due to PNA (multilobar CAP) and COPD exacerbation -continue IV antibiotics -start pulmicort and solumedrol -continue oxygen supplementation -nebulizer tx (scheduled and PRN) -PRN antitussives -follow clinical respsonse  2-early sepsis: patient with fever, elevated WBC's and high RR. Most likely from PNA. -treatment as mentioned above  3-chronic diastolic heart failure: no signs of fluid overload.. Currently compensated -will resume low dose of lasix on daily basis PO -low sodium diet and strict I's and O's  4-GERD: continue PPI  5-HTN: soft, but stable. Will continue current regimen and follow vital signs.  6-Hx of anxiety and depression: mood stable. Continue pamelor and PRN xanax  7-HLD: continue statins  8-Tobacco abuse: cessation counseling provided. Decline nicotine patch at this moment.  Code Status: Full Family Communication: no family at bedside  Disposition Plan: to be determine    Consultants:  None   Procedures:  See below for x-ray reports   Antibiotics:  levaquin   HPI/Subjective: Patient afebrile, slight worsening difficulty breathing, wheezing a lot more and unable to speak in full sentences  Objective: Filed Vitals:   07/08/14 1434  BP: 96/43  Pulse: 88  Temp: 97.9 F (36.6 C)  Resp: 20    Intake/Output Summary (Last 24 hours) at 07/08/14 1701 Last data filed at 07/08/14 1659  Gross per 24 hour  Intake    840 ml  Output    750 ml  Net     90 ml   Filed Weights   07/07/14 0814 07/07/14 1454 07/08/14 0626  Weight: 53.071 kg (117 lb) 58.4 kg (128 lb 12 oz) 57.9 kg (127 lb 10.3 oz)    Exam:   General:  AAOX3, complaining of difficulty breathing, actively wheezing, no CP. Patient unable to speak in full sentences  Cardiovascular: S1 and s2, no rubs or gallops; no  JVD  Respiratory: diffuse wheezing, no rales, no crackles; scattered rhonchi  Abdomen: soft, NT, ND, positive BS  Musculoskeletal: no swelling, no erythema  Data Reviewed: Basic Metabolic Panel:  Recent Labs Lab 07/07/14 0855 07/08/14 0352  NA 137 139  K 4.1 3.7  CL 95* 98  CO2 30 30  GLUCOSE 89 100*  BUN 15 12  CREATININE 1.26* 0.93  CALCIUM 9.8 9.9   CBC:  Recent Labs Lab 07/07/14 0855 07/08/14 0352  WBC 14.4* 10.1  HGB 13.6 11.8*  HCT 41.2 35.5*  MCV 97.6 95.9  PLT 252 240   Cardiac Enzymes:  Recent Labs Lab 07/07/14 0855  TROPONINI <0.30   BNP (last 3 results)  Recent Labs  07/07/14 0855  PROBNP 293.8*    Recent Results (from the past 240 hour(s))  CULTURE, BLOOD (ROUTINE X 2)     Status: None   Collection Time    07/07/14 12:00 PM      Result Value Ref Range Status   Specimen Description BLOOD LEFT HAND   Final   Special Requests BOTTLES DRAWN AEROBIC AND ANAEROBIC 10 CC   Final   Culture  Setup Time     Final   Value: 07/07/2014 18:44     Performed at [facility]   Culture     Final   Value:        BLOOD CULTURE RECEIVED NO GROWTH TO DATE CULTURE WILL BE HELD FOR 5 DAYS BEFORE ISSUING A FINAL NEGATIVE REPORT     Performed at [facility]   Report Status PENDING   Incomplete  CULTURE, BLOOD (ROUTINE X 2)     Status: None   Collection Time    07/07/14 12:25 PM      Result Value Ref Range Status   Specimen Description BLOOD LEFT HAND   Final   Special Requests BOTTLES DRAWN AEROBIC AND ANAEROBIC 10 CC   Final   Culture  Setup Time     Final   Value: 07/07/2014 18:44     Performed at [facility]   Culture     Final   Value:        BLOOD CULTURE RECEIVED NO GROWTH TO DATE CULTURE WILL BE HELD FOR 5 DAYS BEFORE ISSUING A FINAL NEGATIVE REPORT     Performed at [facility]   Report Status PENDING   Incomplete  CULTURE, EXPECTORATED SPUTUM-ASSESSMENT     Status: None   Collection Time    07/08/14  2:38 PM       Result Value Ref Range Status   Specimen Description SPUTUM   Final   Special Requests NONE   Final   Sputum evaluation     Final   Value: THIS SPECIMEN IS ACCEPTABLE. RESPIRATORY CULTURE REPORT TO FOLLOW.   Report Status 07/08/2014 FINAL   Final     Studies: Dg Chest 2 View  07/07/2014   CLINICAL DATA:  Left lower chest pain. Shortness of breath. Generalize weakness. Productive cough. Six day history of the symptoms. Prior history of right breast lumpectomy in 2008.  EXAM: CHEST  2 VIEW  COMPARISON:  CT chest 06/13/2014. Two-view chest x-ray 12/03/2013, 11/05/2013.  FINDINGS: Cardiac silhouette mildly enlarged but stable. Thoracic aorta mildly atherosclerotic, unchanged. Prominent central pulmonary arteries, unchanged. Dense airspace consolidation in the right upper lobe and lingula. Patchy airspace opacities elsewhere in the lower lobes and right middle lobe. Pulmonary vascularity normal without evidence of pulmonary edema. Small bilateral pleural effusions. Mild degenerative changes involving the thoracic spine. Surgical clips in the right axilla from prior node dissection.  IMPRESSION: Multilobar pneumonia, the densest consolidation in the lingula and inferior right upper lobe. Small bilateral pleural effusions. Stable mild cardiomegaly without pulmonary edema.  Follow-up two-view chest x-ray after completion of antibiotic therapy is recommended to confirm complete resolution of the dense consolidation in the lingula and inferior right upper lobe.   Electronically Signed   By: Evangeline Dakin M.D.   On: 07/07/2014 09:54    Scheduled Meds: . aspirin  325 mg Oral q morning - 10a  . budesonide (PULMICORT) nebulizer solution  0.25 mg Nebulization BID  . calcium-vitamin D  0.5 tablet Oral Q breakfast  . enoxaparin (LOVENOX) injection  40 mg Subcutaneous Q24H  . furosemide  40 mg Oral BID  . guaiFENesin  600 mg Oral BID  . ipratropium-albuterol  3 mL Nebulization Q6H  . [START ON  07/09/2014] levofloxacin (LEVAQUIN) IV  750 mg Intravenous Q48H  . methylPREDNISolone (SOLU-MEDROL) injection  40 mg Intravenous Q12H  . montelukast  10 mg Oral QHS  . multivitamin with minerals  1 tablet Oral Daily  . nortriptyline  50 mg Oral QHS  . omega-3 acid ethyl esters  1 g Oral q morning - 10a  . pantoprazole  40 mg Oral q morning - 10a  . potassium chloride SA  20 mEq Oral QPM  . pravastatin  40 mg Oral q1800  . sodium chloride  3 mL Intravenous Q12H   Continuous Infusions:   Principal Problem:  CAP (community acquired pneumonia) Active Problems:   COPD (chronic obstructive pulmonary disease)   Hyperlipidemia   Tobacco abuse   Chronic diastolic CHF (congestive heart failure)   Chronic respiratory failure   HTN (hypertension)   Gastroesophageal reflux disease without esophagitis    Time spent: >30 minutes    Barton Dubois  [facility] Pager [phone]. If 7PM-7AM, please contact night-coverage at [email or web address], password [facility] 07/08/2014, 5:01 PM  LOS: 1 day

## 2014-07-09 DIAGNOSIS — A419 Sepsis, unspecified organism: Secondary | ICD-10-CM | POA: Diagnosis not present

## 2014-07-09 DIAGNOSIS — J438 Other emphysema: Secondary | ICD-10-CM

## 2014-07-09 DIAGNOSIS — J441 Chronic obstructive pulmonary disease with (acute) exacerbation: Secondary | ICD-10-CM

## 2014-07-09 LAB — LEGIONELLA ANTIGEN, URINE

## 2014-07-09 MED ORDER — FUROSEMIDE 20 MG PO TABS
20.0000 mg | ORAL_TABLET | Freq: Two times a day (BID) | ORAL | Status: DC
  Administered 2014-07-09 – 2014-07-11 (×4): 20 mg via ORAL
  Filled 2014-07-09 (×6): qty 1

## 2014-07-09 NOTE — Care Management Note (Addendum)
    Page 1 of 1   07/10/2014     2:50:41 PM CARE MANAGEMENT NOTE 07/10/2014  Patient:  DEALIE, KOELZER   Account Number:  [id number]  Date Initiated:  07/09/2014  Documentation initiated by:  [facility]  Subjective/Objective Assessment:   68 yo female Chief Complaint: Cough, chest pain, difficulty breathing and fevers.//Home with daughter.     Action/Plan:   Admit to telemetry. Blood cultures x2. Influenza panel PCR. Urine Legionella and streptococcal antigen. Sputum cx. IV levofloxacin. Supportive treatment with oxygen, and bronchodilators.//Access for disposition needs.   Anticipated DC Date:  07/11/2014   Anticipated DC Plan:  [place]  CM consult      Choice offered to / List presented to:             Status of service:  In process, will continue to follow Medicare Important Message given?  YES (If response is "NO", the following Medicare IM given date fields will be blank) Date Medicare IM given:  07/10/2014 Medicare IM given by:  [facility] Date Additional Medicare IM given:   Additional Medicare IM given by:    Discharge Disposition:    Per UR Regulation:  Reviewed for med. necessity/level of care/duration of stay  If discussed at [place] of Stay Meetings, dates discussed:    Comments:  07/09/14 [place], RN, BSN, [place] [phone] Pt states that DME oxygen provided by [employer].  NCM reminded pt to have oxygen tank brought from home to hospital prior to discharge for transport; pt states that she only uses oxygen PRN (at night and running errands) and that her portable tank was locked in her room at home.  NCM will continue to follow.

## 2014-07-09 NOTE — Progress Notes (Signed)
[facility] PROGRESS NOTE  [name]:[id number] DOB: October 08, 1945 DOA: 07/07/2014 PCP: Scarlette Calico, MD  Assessment/Plan: 1-acute on chronic resp failure: due to PNA (multilobar CAP) and COPD exacerbation -continue IV antibiotics -continue pulmicort and solumedrol -continue oxygen supplementation -nebulizer tx (scheduled and PRN) -PRN antitussives -follow clinical response -follow CXR in am -no fever, feeling better today.  2-early sepsis: patient with fever, elevated WBC's and high RR. Most likely from PNA. -treatment as mentioned above  3-chronic diastolic heart failure: no signs of fluid overload.. Currently compensated -will continue low dose lasix (20mg  BID) -low sodium diet and strict I's and O's  4-GERD: continue PPI  5-HTN: soft, but stable. Will continue current regimen and follow vital signs. -lasix cut to 20mg  BID instead of 40mg  BID as she regularly take at home -per patient, usual SBP in the low 100's to 115  6-Hx of anxiety and depression: mood stable. Continue pamelor and PRN xanax  7-HLD: continue statins  8-Tobacco abuse: cessation counseling provided. Decline nicotine patch at this moment.  Code Status: Full Family Communication: no family at bedside  Disposition Plan: to be determine    Consultants:  None   Procedures:  See below for x-ray reports   Antibiotics:  levaquin   HPI/Subjective: AAOX3, reports she is breathing easier, wheezing better but still present, no CP. Still with some difficulties speaking in full sentences  Objective: Filed Vitals:   07/09/14 0523  BP: 95/54  Pulse:   Temp: 97.5 F (36.4 C)  Resp:     Intake/Output Summary (Last 24 hours) at 07/09/14 1345 Last data filed at 07/09/14 1123  Gross per 24 hour  Intake   1560 ml  Output   1450 ml  Net    110 ml   Filed Weights   07/07/14 1454 07/08/14 0626 07/09/14 0523  Weight: 58.4 kg (128 lb 12 oz) 57.9 kg (127 lb 10.3 oz) 59.648 kg (131 lb 8  oz)    Exam:   General:  AAOX3, reports she is breathing easier, wheezing better but still present, no CP. Still with some difficulties speaking in full sentences  Cardiovascular: S1 and s2, no rubs or gallops; no JVD  Respiratory: still with diffuse wheezing, no rales, no crackles; scattered rhonchi  Abdomen: soft, NT, ND, positive BS  Musculoskeletal: no swelling, no erythema  Data Reviewed: Basic Metabolic Panel:  Recent Labs Lab 07/07/14 0855 07/08/14 0352  NA 137 139  K 4.1 3.7  CL 95* 98  CO2 30 30  GLUCOSE 89 100*  BUN 15 12  CREATININE 1.26* 0.93  CALCIUM 9.8 9.9   CBC:  Recent Labs Lab 07/07/14 0855 07/08/14 0352  WBC 14.4* 10.1  HGB 13.6 11.8*  HCT 41.2 35.5*  MCV 97.6 95.9  PLT 252 240   Cardiac Enzymes:  Recent Labs Lab 07/07/14 0855  TROPONINI <0.30   BNP (last 3 results)  Recent Labs  07/07/14 0855  PROBNP 293.8*    Recent Results (from the past 240 hour(s))  CULTURE, BLOOD (ROUTINE X 2)     Status: None   Collection Time    07/07/14 12:00 PM      Result Value Ref Range Status   Specimen Description BLOOD LEFT HAND   Final   Special Requests BOTTLES DRAWN AEROBIC AND ANAEROBIC 10 CC   Final   Culture  Setup Time     Final   Value: 07/07/2014 18:44     Performed at [facility]  Final   Value:        BLOOD CULTURE RECEIVED NO GROWTH TO DATE CULTURE WILL BE HELD FOR 5 DAYS BEFORE ISSUING A FINAL NEGATIVE REPORT     Performed at [facility]   Report Status PENDING   Incomplete  CULTURE, BLOOD (ROUTINE X 2)     Status: None   Collection Time    07/07/14 12:25 PM      Result Value Ref Range Status   Specimen Description BLOOD LEFT HAND   Final   Special Requests BOTTLES DRAWN AEROBIC AND ANAEROBIC 10 CC   Final   Culture  Setup Time     Final   Value: 07/07/2014 18:44     Performed at [facility]   Culture     Final   Value:        BLOOD CULTURE RECEIVED NO GROWTH TO DATE CULTURE WILL BE  HELD FOR 5 DAYS BEFORE ISSUING A FINAL NEGATIVE REPORT     Performed at [facility]   Report Status PENDING   Incomplete  CULTURE, EXPECTORATED SPUTUM-ASSESSMENT     Status: None   Collection Time    07/08/14  2:38 PM      Result Value Ref Range Status   Specimen Description SPUTUM   Final   Special Requests NONE   Final   Sputum evaluation     Final   Value: THIS SPECIMEN IS ACCEPTABLE. RESPIRATORY CULTURE REPORT TO FOLLOW.   Report Status 07/08/2014 FINAL   Final  CULTURE, RESPIRATORY (NON-EXPECTORATED)     Status: None   Collection Time    07/08/14  2:38 PM      Result Value Ref Range Status   Specimen Description SPUTUM   Final   Special Requests NONE   Final   Gram Stain     Final   Value: ABUNDANT WBC PRESENT, PREDOMINANTLY PMN     RARE SQUAMOUS EPITHELIAL CELLS PRESENT     FEW GRAM POSITIVE COCCI     IN PAIRS     Performed at [facility]   Culture     Final   Value: Culture reincubated for better growth     Performed at [facility]   Report Status PENDING   Incomplete     Studies: No results found.  Scheduled Meds: . aspirin  325 mg Oral q morning - 10a  . budesonide (PULMICORT) nebulizer solution  0.25 mg Nebulization BID  . calcium-vitamin D  0.5 tablet Oral Q breakfast  . enoxaparin (LOVENOX) injection  40 mg Subcutaneous Q24H  . furosemide  20 mg Oral BID  . guaiFENesin  600 mg Oral BID  . ipratropium-albuterol  3 mL Nebulization Q6H  . lactulose  20 g Oral TID  . levofloxacin (LEVAQUIN) IV  750 mg Intravenous Q48H  . methylPREDNISolone (SOLU-MEDROL) injection  40 mg Intravenous Q12H  . montelukast  10 mg Oral QHS  . multivitamin with minerals  1 tablet Oral Daily  . nortriptyline  50 mg Oral QHS  . omega-3 acid ethyl esters  1 g Oral q morning - 10a  . pantoprazole  40 mg Oral q morning - 10a  . potassium chloride SA  20 mEq Oral QPM  . pravastatin  40 mg Oral q1800  . sodium chloride  3 mL Intravenous Q12H   Continuous  Infusions:   Principal Problem:   CAP (community acquired pneumonia) Active Problems:   COPD (chronic obstructive pulmonary disease)   Hyperlipidemia  Tobacco abuse   Chronic diastolic CHF (congestive heart failure)   Chronic respiratory failure   HTN (hypertension)   Gastroesophageal reflux disease without esophagitis    Time spent: < 30 minutes    Barton Dubois  [facility] Pager [phone]. If 7PM-7AM, please contact night-coverage at [email or web address], password [facility] 07/09/2014, 1:45 PM  LOS: 2 days

## 2014-07-10 ENCOUNTER — Inpatient Hospital Stay (HOSPITAL_COMMUNITY): Payer: Medicare Other

## 2014-07-10 LAB — CBC
HCT: 35.7 % — ABNORMAL LOW (ref 36.0–46.0)
Hemoglobin: 11.7 g/dL — ABNORMAL LOW (ref 12.0–15.0)
MCH: 32.1 pg (ref 26.0–34.0)
MCHC: 32.8 g/dL (ref 30.0–36.0)
MCV: 97.8 fL (ref 78.0–100.0)
Platelets: 303 10*3/uL (ref 150–400)
RBC: 3.65 MIL/uL — ABNORMAL LOW (ref 3.87–5.11)
RDW: 12.5 % (ref 11.5–15.5)
WBC: 7.5 10*3/uL (ref 4.0–10.5)

## 2014-07-10 LAB — BASIC METABOLIC PANEL
Anion gap: 9 (ref 5–15)
BUN: 16 mg/dL (ref 6–23)
CO2: 31 mEq/L (ref 19–32)
Calcium: 9.2 mg/dL (ref 8.4–10.5)
Chloride: 97 mEq/L (ref 96–112)
Creatinine, Ser: 0.88 mg/dL (ref 0.50–1.10)
GFR calc Af Amer: 76 mL/min — ABNORMAL LOW (ref >90)
GFR calc non Af Amer: 66 mL/min — ABNORMAL LOW (ref >90)
Glucose, Bld: 111 mg/dL — ABNORMAL HIGH (ref 70–99)
Potassium: 4.6 mEq/L (ref 3.7–5.3)
Sodium: 137 mEq/L (ref 137–147)

## 2014-07-10 MED ORDER — IPRATROPIUM BROMIDE 0.02 % IN SOLN: 2.5000 mL | Freq: Three times a day (TID) | RESPIRATORY_TRACT | Status: DC

## 2014-07-10 MED ORDER — FUROSEMIDE 40 MG PO TABS
40.0000 mg | ORAL_TABLET | Freq: Two times a day (BID) | ORAL | Status: DC
Start: 2014-07-10 — End: 2014-07-10

## 2014-07-10 MED ORDER — ONDANSETRON HCL 4 MG PO TABS: 8.0000 mg | ORAL_TABLET | Freq: Two times a day (BID) | ORAL | Status: DC | PRN

## 2014-07-10 MED ORDER — IPRATROPIUM-ALBUTEROL 0.5-2.5 (3) MG/3ML IN SOLN
3.0000 mL | Freq: Three times a day (TID) | RESPIRATORY_TRACT | Status: DC
  Administered 2014-07-10 – 2014-07-11 (×3): 3 mL via RESPIRATORY_TRACT
  Filled 2014-07-10 (×3): qty 3

## 2014-07-10 MED ORDER — BUTALBITAL-APAP-CAFFEINE 50-325-40 MG PO TABS
1.0000 | ORAL_TABLET | ORAL | Status: DC | PRN
  Administered 2014-07-10: 1 via ORAL
  Filled 2014-07-10: qty 1

## 2014-07-10 MED ORDER — LEVOFLOXACIN IN D5W 750 MG/150ML IV SOLN
750.0000 mg | INTRAVENOUS | Status: DC
  Administered 2014-07-10: 750 mg via INTRAVENOUS
  Filled 2014-07-10 (×2): qty 150

## 2014-07-10 MED ORDER — PREDNISONE 20 MG PO TABS
40.0000 mg | ORAL_TABLET | Freq: Every day | ORAL | Status: DC
  Administered 2014-07-11: 40 mg via ORAL
  Filled 2014-07-10 (×2): qty 2

## 2014-07-10 MED ORDER — BUDESONIDE-FORMOTEROL FUMARATE 160-4.5 MCG/ACT IN AERO
2.0000 | INHALATION_SPRAY | Freq: Two times a day (BID) | RESPIRATORY_TRACT | Status: DC
  Administered 2014-07-10 – 2014-07-11 (×2): 2 via RESPIRATORY_TRACT
  Filled 2014-07-10: qty 6

## 2014-07-10 NOTE — Progress Notes (Signed)
ANTIBIOTIC CONSULT NOTE   Pharmacy Consult for Levofloxacin Indication: CAP  Allergies  Allergen Reactions  . Hydroxyzine Shortness Of Breath and Other (See Comments)    Pt states med make her light headed, get sob sxs  . Ceftriaxone Other (See Comments)    "Blow up like a balloon"  . Lexapro [Escitalopram] Other (See Comments)    Pt states med make her dizzy    Patient Measurements: Height: 5\' 3"  (160 cm) Weight: 132 lb (59.875 kg) (scale C) IBW/kg (Calculated) : 52.4   Vital Signs: Temp: 97.6 F (36.4 C) (10/14 0456) Temp Source: Oral (10/14 0456) BP: 106/60 mmHg (10/14 0456) Pulse Rate: 78 (10/14 0456) Intake/Output from previous day: 10/13 0701 - 10/14 0700 In: 1080 [P.O.:1080] Out: 2600 [Urine:2600] Intake/Output from this shift:    Labs:  Recent Labs  07/07/14 0855 07/08/14 0352 07/10/14 0337  WBC 14.4* 10.1 7.5  HGB 13.6 11.8* 11.7*  PLT 252 240 303  CREATININE 1.26* 0.93 0.88   Estimated Creatinine Clearance: 50.6 ml/min (by C-G formula based on Cr of 0.88). No results found for this basename: VANCOTROUGH, Corlis Leak, VANCORANDOM, [place], GENTPEAK, GENTRANDOM, TOBRATROUGH, TOBRAPEAK, TOBRARND, AMIKACINPEAK, AMIKACINTROU, AMIKACIN,  in the last 72 hours   Microbiology: Recent Results (from the past 720 hour(s))  CULTURE, BLOOD (ROUTINE X 2)     Status: None   Collection Time    07/07/14 12:00 PM      Result Value Ref Range Status   Specimen Description BLOOD LEFT HAND   Final   Special Requests BOTTLES DRAWN AEROBIC AND ANAEROBIC 10 CC   Final   Culture  Setup Time     Final   Value: 07/07/2014 18:44     Performed at [facility]   Culture     Final   Value:        BLOOD CULTURE RECEIVED NO GROWTH TO DATE CULTURE WILL BE HELD FOR 5 DAYS BEFORE ISSUING A FINAL NEGATIVE REPORT     Performed at [facility]   Report Status PENDING   Incomplete  CULTURE, BLOOD (ROUTINE X 2)     Status: None   Collection Time    07/07/14 12:25 PM       Result Value Ref Range Status   Specimen Description BLOOD LEFT HAND   Final   Special Requests BOTTLES DRAWN AEROBIC AND ANAEROBIC 10 CC   Final   Culture  Setup Time     Final   Value: 07/07/2014 18:44     Performed at [facility]   Culture     Final   Value:        BLOOD CULTURE RECEIVED NO GROWTH TO DATE CULTURE WILL BE HELD FOR 5 DAYS BEFORE ISSUING A FINAL NEGATIVE REPORT     Performed at [facility]   Report Status PENDING   Incomplete  CULTURE, EXPECTORATED SPUTUM-ASSESSMENT     Status: None   Collection Time    07/08/14  2:38 PM      Result Value Ref Range Status   Specimen Description SPUTUM   Final   Special Requests NONE   Final   Sputum evaluation     Final   Value: THIS SPECIMEN IS ACCEPTABLE. RESPIRATORY CULTURE REPORT TO FOLLOW.   Report Status 07/08/2014 FINAL   Final  CULTURE, RESPIRATORY (NON-EXPECTORATED)     Status: None   Collection Time    07/08/14  2:38 PM      Result Value Ref Range Status  Specimen Description SPUTUM   Final   Special Requests NONE   Final   Gram Stain     Final   Value: ABUNDANT WBC PRESENT, PREDOMINANTLY PMN     RARE SQUAMOUS EPITHELIAL CELLS PRESENT     FEW GRAM POSITIVE COCCI     IN PAIRS     Performed at [facility]   Culture     Final   Value: Culture reincubated for better growth     Performed at [facility]   Report Status PENDING   Incomplete    Medical History: Past Medical History  Diagnosis Date  . Angina   . Heart murmur   . Depressed   . Coronary artery disease 2006    2 stents w/previous MI  . Hypertension   . Migraine   . Anxiety   . Myocardial infarction 2006    NSTEMI  . COPD (chronic obstructive pulmonary disease)     oxygen-dependent 4LPM [place]  . Moderate to severe pulmonary hypertension   . Diastolic CHF   . Cancer of breast dx'd 2008    RT Breast  . Aortic stenosis     mild  . Dyslipidemia   . NSVT (nonsustained ventricular tachycardia)     h/o  .  History of nuclear stress test 08/03/2011    attenuation at apex - no perfusion defects   . Abnormal LFTs 08/02/2011  . Acute liver failure 06/19/2013  . Acute renal failure 06/19/2013  . Acute respiratory failure 06/19/2013  . Altered mental status 08/01/2011  . Anxiety state, unspecified 12/03/2013  . Breast cancer of upper-outer quadrant of left female breast 08/21/2013  . CAD (coronary artery disease) of artery bypass graft 11/06/2013  . CAD (coronary artery disease), MI R/O 08/01/2011    PCI in 2006 (bare metal stent, unknown artery) - [place]   . CHF,  acute diastolic, BNP 4k on admissio 08/01/2011  . Chronic respiratory failure 02/02/2012  . Compression fracture of L1 lumbar vertebra 02/02/2012  . Mild aortic stenosis 08/01/2011    AVA 1.69 cm2 (06/02/2011)   . Hyponatremia 01/31/2012  . Hyperkalemia, on ACE prior to admission 11/11/2011  . HTN (hypertension) 11/06/2013  . History of breast cancer in female 06/23/2013  . 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 albuterol (PROVENTIL HFA;VENTOLIN HFA) 108 (90 BASE) MCG/ACT inhaler Inhale 2 puffs into the lungs every 4 (four) hours as needed for shortness of breath. Shortness of breath and wheezing  Patient is using Pro-Air brand      . albuterol (PROVENTIL) (5 MG/ML) 0.5% nebulizer solution Take 2.5 mg by nebulization every 2 (two) hours as needed for wheezing or shortness of breath.      . ALPRAZolam (XANAX) 1 MG tablet Take 1 tablet (1 mg total) by mouth 3 (three) times daily as needed for sleep or anxiety.  90 tablet  3  . aspirin 325 MG EC tablet Take 325 mg by mouth every morning.       Marland Kitchen aspirin-acetaminophen-caffeine (EXCEDRIN MIGRAINE) 250-250-65 MG per tablet Take 2 tablets by mouth daily as needed for headache.      . budesonide-formoterol (SYMBICORT) 160-4.5 MCG/ACT inhaler Inhale 2 puffs into the lungs 2 (two) times daily.  3 Inhaler  3  .  butalbital-acetaminophen-caffeine (FIORICET) 50-325-40 MG per tablet Take 1-2 pills as needed for headache  30 tablet  5  . calcium-vitamin D (OSCAL WITH D)  250-125 MG-UNIT per tablet Take 1 tablet by mouth every morning.       . denosumab (XGEVA) 120 MG/1.7ML SOLN injection Inject 120 mg into the skin once.      . diclofenac sodium (VOLTAREN) 1 % GEL Apply 1 application topically daily as needed (apply to hands). Hand pain      . furosemide (LASIX) 40 MG tablet Take 40 mg by mouth 2 (two) times daily.      Marland Kitchen ipratropium (ATROVENT HFA) 17 MCG/ACT inhaler Inhale 2 puffs into the lungs 3 (three) times daily.      Marland Kitchen lactulose, encephalopathy, (CHRONULAC) 10 GM/15ML SOLN Take 20 g by mouth 3 (three) times daily as needed (Constipation).       . montelukast (SINGULAIR) 10 MG tablet Take 10 mg by mouth at bedtime.      . Multiple Vitamins-Minerals (MULTIVITAMIN WITH MINERALS) tablet Take 1 tablet by mouth every morning.       . nortriptyline (PAMELOR) 25 MG capsule Take 2 capsules (50 mg total) by mouth at bedtime.  60 capsule  3  . Omega 3 1200 MG CAPS Take 1,200 mg by mouth every morning.       . ondansetron (ZOFRAN) 8 MG tablet Take 1 tablet (8 mg total) by mouth 2 (two) times daily as needed for nausea or vomiting.  30 tablet  11  . pantoprazole (PROTONIX) 40 MG tablet Take 40 mg by mouth every morning.      . Pitavastatin Calcium (LIVALO) 2 MG TABS Take 2 mg by mouth every evening.       . potassium chloride SA (K-DUR,KLOR-CON) 20 MEQ tablet Take 20 mEq by mouth every evening.       . OXYGEN-HELIUM IN Inhale 3 L into the lungs as needed.       Scheduled:  . aspirin  325 mg Oral q morning - 10a  . budesonide (PULMICORT) nebulizer solution  0.25 mg Nebulization BID  . calcium-vitamin D  0.5 tablet Oral Q breakfast  . enoxaparin (LOVENOX) injection  40 mg Subcutaneous Q24H  . furosemide  20 mg Oral BID  . guaiFENesin  600 mg Oral BID  . ipratropium-albuterol  3 mL Nebulization Q6H  . lactulose   20 g Oral TID  . levofloxacin (LEVAQUIN) IV  750 mg Intravenous Q48H  . methylPREDNISolone (SOLU-MEDROL) injection  40 mg Intravenous Q12H  . montelukast  10 mg Oral QHS  . multivitamin with minerals  1 tablet Oral Daily  . nortriptyline  50 mg Oral QHS  . omega-3 acid ethyl esters  1 g Oral q morning - 10a  . pantoprazole  40 mg Oral q morning - 10a  . potassium chloride SA  20 mEq Oral QPM  . pravastatin  40 mg Oral q1800  . sodium chloride  3 mL Intravenous Q12H   Assessment: 68 y.o female with increased WBC and chest x-ray suggest multilobar pneumonia. She was started on IV levaquin.  Her SrCr today is 0.88 mg/dL and CrCl ~ 50 ml/min.   Goal of Therapy:  Eradication of infection and appropriate dosing for patient's renal function.   Plan:  Change levaquin to 750 mg IV q24 hours  F/u clinical course  Thanks for allowing pharmacy to be a part of this patient's care.  Excell Seltzer, PharmD Clinical Pharmacist, [phone] 07/10/2014,7:48 AM

## 2014-07-10 NOTE — Progress Notes (Signed)
[facility] PROGRESS NOTE  Assessment/Plan: Acute on chronic resp failure due to PNA (multilobar CAP) and COPD exacerbation oxygen 3L at home: - Started on empiric antibiotics 10.11.2015,  - continue pulmicort and solumedrol  - continue oxygen supplementation  - nebulizer tx (scheduled and PRN)  - no fever, feeling better today.   Sepsis:  - Patient with fever, elevated WBC's.  Most likely from PNA.  - Treatment as mentioned above.  Chronic diastolic heart failure:  - no signs of fluid overload. Currently compensated  - will continue low dose lasix (20mg  BID)  - low sodium diet and strict I's and O's   GERD:  - continue PPI   EssentialHTN:  - Soft, but stable. Will continue current regimen and follow vital signs.  - cont lasix 20mg  BID. - Per patient, usual SBP in the low 100's to 115   Hx of anxiety and depression:  - mood stable. Continue pamelor and PRN xanax   HLD: -  continue statins   Tobacco abuse: - cessation counseling provided. Decline nicotine patch at this moment.    Code Status: full Family Communication: none  Disposition Plan: inpatient   Consultants:  none  Procedures:  CXR  Antibiotics:  Rocephin and azithro  HPI/Subjective: Relates her SOB is improved.  Objective: Filed Vitals:   07/09/14 2112 07/09/14 2145 07/10/14 0117 07/10/14 0456  BP:  107/57  106/60  Pulse:  84  78  Temp:  98.5 F (36.9 C)  97.6 F (36.4 C)  TempSrc:  Oral  Oral  Resp:  20  20  Height:      Weight:    59.875 kg (132 lb)  SpO2: 92% 100% 100% 100%    Intake/Output Summary (Last 24 hours) at 07/10/14 1128 Last data filed at 07/10/14 1120  Gross per 24 hour  Intake    720 ml  Output   2500 ml  Net  -1780 ml   Filed Weights   07/08/14 0626 07/09/14 0523 07/10/14 0456  Weight: 57.9 kg (127 lb 10.3 oz) 59.648 kg (131 lb 8 oz) 59.875 kg (132 lb)    Exam:  General: Alert, awake, oriented x3, in no acute distress.  HEENT: No bruits, no  goiter.  Heart: Regular rate and rhythm. Lungs: mod air movement, clear Abdomen: Soft, nontender, nondistended, positive bowel sounds.    Data Reviewed: Basic Metabolic Panel:  Recent Labs Lab 07/07/14 0855 07/08/14 0352 07/10/14 0337  NA 137 139 137  K 4.1 3.7 4.6  CL 95* 98 97  CO2 30 30 31   GLUCOSE 89 100* 111*  BUN 15 12 16   CREATININE 1.26* 0.93 0.88  CALCIUM 9.8 9.9 9.2   Liver Function Tests: No results found for this basename: AST, ALT, ALKPHOS, BILITOT, PROT, ALBUMIN,  in the last 168 hours No results found for this basename: LIPASE, AMYLASE,  in the last 168 hours No results found for this basename: AMMONIA,  in the last 168 hours CBC:  Recent Labs Lab 07/07/14 0855 07/08/14 0352 07/10/14 0337  WBC 14.4* 10.1 7.5  HGB 13.6 11.8* 11.7*  HCT 41.2 35.5* 35.7*  MCV 97.6 95.9 97.8  PLT 252 240 303   Cardiac Enzymes:  Recent Labs Lab 07/07/14 0855  TROPONINI <0.30   BNP (last 3 results)  Recent Labs  07/07/14 0855  PROBNP 293.8*   CBG: No results found for this basename: GLUCAP,  in the last 168 hours  Recent Results (from the past 240 hour(s))  CULTURE, BLOOD (  ROUTINE X 2)     Status: None   Collection Time    07/07/14 12:00 PM      Result Value Ref Range Status   Specimen Description BLOOD LEFT HAND   Final   Special Requests BOTTLES DRAWN AEROBIC AND ANAEROBIC 10 CC   Final   Culture  Setup Time     Final   Value: 07/07/2014 18:44     Performed at [facility]   Culture     Final   Value:        BLOOD CULTURE RECEIVED NO GROWTH TO DATE CULTURE WILL BE HELD FOR 5 DAYS BEFORE ISSUING A FINAL NEGATIVE REPORT     Performed at [facility]   Report Status PENDING   Incomplete  CULTURE, BLOOD (ROUTINE X 2)     Status: None   Collection Time    07/07/14 12:25 PM      Result Value Ref Range Status   Specimen Description BLOOD LEFT HAND   Final   Special Requests BOTTLES DRAWN AEROBIC AND ANAEROBIC 10 CC   Final   Culture   Setup Time     Final   Value: 07/07/2014 18:44     Performed at [facility]   Culture     Final   Value:        BLOOD CULTURE RECEIVED NO GROWTH TO DATE CULTURE WILL BE HELD FOR 5 DAYS BEFORE ISSUING A FINAL NEGATIVE REPORT     Performed at [facility]   Report Status PENDING   Incomplete  CULTURE, EXPECTORATED SPUTUM-ASSESSMENT     Status: None   Collection Time    07/08/14  2:38 PM      Result Value Ref Range Status   Specimen Description SPUTUM   Final   Special Requests NONE   Final   Sputum evaluation     Final   Value: THIS SPECIMEN IS ACCEPTABLE. RESPIRATORY CULTURE REPORT TO FOLLOW.   Report Status 07/08/2014 FINAL   Final  CULTURE, RESPIRATORY (NON-EXPECTORATED)     Status: None   Collection Time    07/08/14  2:38 PM      Result Value Ref Range Status   Specimen Description SPUTUM   Final   Special Requests NONE   Final   Gram Stain     Final   Value: ABUNDANT WBC PRESENT, PREDOMINANTLY PMN     RARE SQUAMOUS EPITHELIAL CELLS PRESENT     FEW GRAM POSITIVE COCCI     IN PAIRS     Performed at [facility]   Culture     Final   Value: Culture reincubated for better growth     Performed at [facility]   Report Status PENDING   Incomplete     Studies: Dg Chest 2 View  07/10/2014   CLINICAL DATA:  68 year old female with shortness of breath. Recent history of pneumonia.  EXAM: CHEST  2 VIEW  COMPARISON:  Chest x-ray 07/07/2014.  FINDINGS: Again noted is diffuse peribronchial cuffing with patchy multifocal airspace disease, most confluent in the lingula and inferior aspect of the right upper lobe. There has been slight improvement in aeration in these regions compared to examination femoral 07/07/2014. No new areas of airspace consolidation are noted. All probable trace bilateral pleural effusions. No evidence of pulmonary edema. Heart size is normal. Mediastinal contours are unremarkable. Atherosclerotic calcifications in the arch of aorta.  Surgical clips in the right axilla related to prior axillary  nodal dissection. Multiple surgical clips projecting over the upper abdomen.  IMPRESSION: 1. Findings are again compatible with multilobar pneumonia, with most extensive consolidation in the lingula and inferior right upper lobe. Overall, aeration has improved slightly compared to the recent prior examination, but continued attention on followup standing PA and lateral chest radiographs is recommended to ensure complete resolution of these findings. 2. Atherosclerosis.   Electronically Signed   By: Vinnie Langton M.D.   On: 07/10/2014 08:11    Scheduled Meds: . aspirin  325 mg Oral q morning - 10a  . budesonide (PULMICORT) nebulizer solution  0.25 mg Nebulization BID  . calcium-vitamin D  0.5 tablet Oral Q breakfast  . enoxaparin (LOVENOX) injection  40 mg Subcutaneous Q24H  . furosemide  20 mg Oral BID  . guaiFENesin  600 mg Oral BID  . ipratropium-albuterol  3 mL Nebulization Q6H  . lactulose  20 g Oral TID  . levofloxacin (LEVAQUIN) IV  750 mg Intravenous Q24H  . methylPREDNISolone (SOLU-MEDROL) injection  40 mg Intravenous Q12H  . montelukast  10 mg Oral QHS  . multivitamin with minerals  1 tablet Oral Daily  . nortriptyline  50 mg Oral QHS  . omega-3 acid ethyl esters  1 g Oral q morning - 10a  . pantoprazole  40 mg Oral q morning - 10a  . potassium chloride SA  20 mEq Oral QPM  . pravastatin  40 mg Oral q1800  . sodium chloride  3 mL Intravenous Q12H   Continuous Infusions:    Charlynne Cousins  [facility] Pager [phone]. If 8PM-8AM, please contact night-coverage at [email or web address], password [facility] 07/10/2014, 11:28 AM  LOS: 3 days

## 2014-07-11 DIAGNOSIS — J9601 Acute respiratory failure with hypoxia: Secondary | ICD-10-CM | POA: Diagnosis present

## 2014-07-11 MED ORDER — PREDNISONE 10 MG PO TABS: ORAL_TABLET | ORAL | Status: DC

## 2014-07-11 MED ORDER — LEVOFLOXACIN 750 MG PO TABS
750.0000 mg | ORAL_TABLET | Freq: Every day | ORAL | Status: DC
  Administered 2014-07-11: 750 mg via ORAL
  Filled 2014-07-11: qty 1

## 2014-07-11 MED ORDER — LEVOFLOXACIN 750 MG PO TABS: 750.0000 mg | ORAL_TABLET | Freq: Every day | ORAL | Status: DC

## 2014-07-11 NOTE — Progress Notes (Signed)
Patient given discharge instructions and all questions answered.  Pt. Discharged via wheelchair with all belongings.   

## 2014-07-11 NOTE — Discharge Summary (Addendum)
Physician Discharge Summary  [name] WUJ:[id number] DOB: Jul 30, 1946 DOA: 07/07/2014  PCP: Scarlette Calico, MD  Admit date: 07/07/2014 Discharge date: 07/11/2014  Time spent: 35 minutes  Recommendations for Outpatient Follow-up:  1. Follow up with PCP in 2-4 weeks.  Discharge Diagnoses:  Principal Problem:   Acute respiratory failure with hypoxia Active Problems:   COPD (chronic obstructive pulmonary disease)   Hyperlipidemia   Tobacco abuse   Chronic diastolic CHF (congestive heart failure)   Chronic respiratory failure   HTN (hypertension)   Gastroesophageal reflux disease without esophagitis   CAP (community acquired pneumonia)   COPD exacerbation   Discharge Condition: stable  Diet recommendation: heart healthy  Filed Weights   07/09/14 0523 07/10/14 0456 07/11/14 0556  Weight: 59.648 kg (131 lb 8 oz) 59.875 kg (132 lb) 59.6 kg (131 lb 6.3 oz)    History of present illness:  68 y.o. female with extensive past medical history-COPD, chronic respiratory failure on 4 L per minute oxygen at bedtime, MI, CAD status post stents, chronic diastolic CHF, hypertension, migraine, anxiety, right breast cancer status post lumpectomy and chemotherapy, dyslipidemia, several abdominal surgeries, occasional tobacco use, present to the ED with above complaints. She was in usual state of health until 4-5 days ago. Since then she's been experiencing nightly drenching sweats and high fevers but has not checked her temperatures. In the daytime she's been feeling weak and mostly lying in bed. She complains of new onset cough that is nonproductive. No sickly contacts. Last night her symptoms seemed to get worse with worsening cough, dyspnea, left-sided chest pain that is worse with with chest wall movements, coughing or touching. She denies wheezing. She has not taken her flu shot this season. In the ED, afebrile, oxygen saturation in the low 90s, creatinine 1.26, WBC 14.4 and chest x-ray suggest  multilobar pneumonia. She was started on IV levofloxacin (allergic to ceftriaxone) and hospitalist admission requested.   Hospital Course:  Acute on chronic resp failure due to PNA (multilobar CAP) and COPD exacerbation oxygen 3L at home:  - Started on empiric antibiotics, steroids and inhalers 10.11.2015. - Continue pulmicort and solumedrol  - Nebulizer tx (scheduled and PRN)  - Change to oral steroids and antibiotics once oxygenation improved.  Sepsis:  - Patient with fever, elevated WBC's. Most likely from PNA.  - Treatment as mentioned above.   Chronic diastolic heart failure:  - No signs of fluid overload. Currently compensated  - Will continue low dose lasix (20mg  BID)  - Low sodium diet and strict I's and O's   GERD:  - continue PPI   EssentialHTN:  - Soft, but stable. Will continue current regimen and follow vital signs.  - cont lasix 20mg  BID.  - Per patient, usual SBP in the low 100's to 115   Hx of anxiety and depression:  - mood stable. Continue pamelor and PRN xanax   Procedures:  CXR  Consultations:  noen  Discharge Exam: Filed Vitals:   07/11/14 0900  BP: 84/55  Pulse: 88  Temp:   Resp:     General: A&O x3 Cardiovascular: rrr Respiratory: Good air movement CTA B/L  Discharge Instructions You were cared for by a hospitalist during your hospital stay. If you have any questions about your discharge medications or the care you received while you were in the hospital after you are discharged, you can call the unit and asked to speak with the hospitalist on call if the hospitalist that took care of you is not  available. Once you are discharged, your primary care physician will handle any further medical issues. Please note that NO REFILLS for any discharge medications will be authorized once you are discharged, as it is imperative that you return to your primary care physician (or establish a relationship with a primary care physician if you do not have  one) for your aftercare needs so that they can reassess your need for medications and monitor your lab values.  Discharge Instructions   Diet - low sodium heart healthy    Complete by:  As directed      Increase activity slowly    Complete by:  As directed           Current Discharge Medication List    START taking these medications   Details  levofloxacin (LEVAQUIN) 750 MG tablet Take 1 tablet (750 mg total) by mouth daily. Qty: 3 tablet, Refills: 0    predniSONE (DELTASONE) 10 MG tablet Takes 6 tablets for 1 days, then 5 tablets for 1 days, then 4 tablets for 1 days, then 3 tablets for 1 days, then 2 tabs for 1 days, then 1 tab for 1 days, and then stop. Qty: 21 tablet, Refills: 0      CONTINUE these medications which have NOT CHANGED   Details  albuterol (PROVENTIL HFA;VENTOLIN HFA) 108 (90 BASE) MCG/ACT inhaler Inhale 2 puffs into the lungs every 4 (four) hours as needed for shortness of breath. Shortness of breath and wheezing  Patient is using Pro-Air brand    albuterol (PROVENTIL) (5 MG/ML) 0.5% nebulizer solution Take 2.5 mg by nebulization every 2 (two) hours as needed for wheezing or shortness of breath.    ALPRAZolam (XANAX) 1 MG tablet Take 1 tablet (1 mg total) by mouth 3 (three) times daily as needed for sleep or anxiety. Qty: 90 tablet, Refills: 3   Associated Diagnoses: Anxiety state, unspecified    aspirin 325 MG EC tablet Take 325 mg by mouth every morning.     aspirin-acetaminophen-caffeine (EXCEDRIN MIGRAINE) 250-250-65 MG per tablet Take 2 tablets by mouth daily as needed for headache.    budesonide-formoterol (SYMBICORT) 160-4.5 MCG/ACT inhaler Inhale 2 puffs into the lungs 2 (two) times daily. 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     denosumab (XGEVA) 120 MG/1.7ML SOLN injection Inject 120 mg into the skin once.    diclofenac sodium (VOLTAREN) 1 % GEL Apply 1 application topically daily as needed (apply to hands). Hand pain    furosemide (LASIX) 40 MG tablet Take 40 mg by mouth 2 (two) times daily.    ipratropium (ATROVENT HFA) 17 MCG/ACT inhaler Inhale 2 puffs into the lungs 3 (three) times daily.    lactulose, encephalopathy, (CHRONULAC) 10 GM/15ML SOLN Take 20 g by mouth 3 (three) times daily as needed (Constipation).     montelukast (SINGULAIR) 10 MG tablet Take 10 mg by mouth at bedtime.    Multiple Vitamins-Minerals (MULTIVITAMIN WITH MINERALS) tablet Take 1 tablet by mouth every morning.     nortriptyline (PAMELOR) 25 MG capsule Take 2 capsules (50 mg total) by mouth at bedtime. Qty: 60 capsule, Refills: 3    Omega 3 1200 MG CAPS Take 1,200 mg by mouth every morning.  ondansetron (ZOFRAN) 8 MG tablet Take 1 tablet (8 mg total) by mouth 2 (two) times daily as needed for nausea or vomiting. 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    Pitavastatin Calcium (LIVALO) 2 MG TABS Take 2 mg by mouth every evening.     potassium chloride SA (K-DUR,KLOR-CON) 20 MEQ tablet Take 20 mEq by mouth every evening.     OXYGEN-HELIUM IN Inhale 3 L into the lungs as needed.       Allergies  Allergen Reactions  . Hydroxyzine Shortness Of Breath and Other (See Comments)    Pt states med make her light headed, get sob sxs  . Ceftriaxone Other (See Comments)    "Blow up like a balloon"  . Lexapro [Escitalopram] Other (See Comments)    Pt states med make her dizzy   Follow-up Information   Follow up with Scarlette Calico, MD On 07/18/2014. (hospital follow up 2:15p SW [facility])    Specialty:  Internal Medicine   Contact information:    [address] [place] [address] [phone]        The results of significant diagnostics from this hospitalization (including imaging, microbiology, ancillary and laboratory) are listed below for reference.    Significant Diagnostic Studies: Dg Chest 2 View  07/10/2014   CLINICAL DATA:  68 year old female with shortness of breath. Recent history of pneumonia.  EXAM: CHEST  2 VIEW  COMPARISON:  Chest x-ray 07/07/2014.  FINDINGS: Again noted is diffuse peribronchial cuffing with patchy multifocal airspace disease, most confluent in the lingula and inferior aspect of the right upper lobe. There has been slight improvement in aeration in these regions compared to examination femoral 07/07/2014. No new areas of airspace consolidation are noted. All probable trace bilateral pleural effusions. No evidence of pulmonary edema. Heart size is normal. Mediastinal contours are unremarkable. Atherosclerotic calcifications in the arch of aorta. Surgical clips in the right axilla related to prior axillary nodal dissection. Multiple surgical clips projecting over the upper abdomen.  IMPRESSION: 1. Findings are again compatible with multilobar pneumonia, with most extensive consolidation in the lingula and inferior right upper lobe. Overall, aeration has improved slightly compared to the recent prior examination, but continued attention on followup standing PA and lateral chest radiographs is recommended to ensure complete resolution of these findings. 2. Atherosclerosis.   Electronically Signed   By: Vinnie Langton M.D.   On: 07/10/2014 08:11   Dg Chest 2 View  07/07/2014   CLINICAL DATA:  Left lower chest pain. Shortness of breath. Generalize weakness. Productive cough. Six day history of the symptoms. Prior history of right breast lumpectomy in 2008.  EXAM: CHEST  2 VIEW  COMPARISON:  CT chest 06/13/2014. Two-view chest x-ray 12/03/2013, 11/05/2013.  FINDINGS: Cardiac silhouette mildly  enlarged but stable. Thoracic aorta mildly atherosclerotic, unchanged. Prominent central pulmonary arteries, unchanged. Dense airspace consolidation in the right upper lobe and lingula. Patchy airspace opacities elsewhere in the lower lobes and right middle lobe. Pulmonary vascularity normal without evidence of pulmonary edema. Small bilateral pleural effusions. Mild degenerative changes involving the thoracic spine. Surgical clips in the right axilla from prior node dissection.  IMPRESSION: Multilobar pneumonia, the densest consolidation in the lingula and inferior right upper lobe. Small bilateral pleural effusions. Stable mild cardiomegaly without pulmonary edema.  Follow-up two-view chest x-ray after completion of antibiotic therapy is recommended to  confirm complete resolution of the dense consolidation in the lingula and inferior right upper lobe.   Electronically Signed   By: Evangeline Dakin M.D.   On: 07/07/2014 09:54   Ct Chest Low Dose Screening W/o Cm  06/13/2014   CLINICAL DATA:  68 year old female current smoker with 42 pack year history of smoking. Lung cancer screening examination. History breast cancer diagnosed in 2008.  EXAM: CT CHEST WITHOUT CONTRAST  TECHNIQUE: Multidetector CT imaging of the chest was performed following the standard protocol without intravenous contrast. High resolution imaging of the lungs, as well as inspiratory and expiratory imaging, was performed.  COMPARISON:  Chest CT 01/31/2012.  FINDINGS: Mediastinum: Heart size is normal. There is no significant pericardial fluid, thickening or pericardial calcification. There is atherosclerosis of the thoracic aorta, the great vessels of the mediastinum and the coronary arteries, including calcified atherosclerotic plaque in the left main, left anterior descending, left circumflex and right coronary arteries. Calcifications of the aortic valve. Multiple prominent borderline enlarged mediastinal lymph nodes. Enlarged lower right  paratracheal lymph node measuring 1.6 cm in short axis appears slightly smaller than remote prior study 01/31/2012. Please note that accurate exclusion of hilar adenopathy is limited on noncontrast CT scans. Esophagus is unremarkable in appearance.  Lungs/Pleura: Mass-like area of chronic consolidation in the apex of the right upper lobe, slightly less pronounced than remote prior examination from 01/31/2012, most compatible with postradiation mass-like fibrosis. There continue be some additional areas of subpleural fibrosis in the anterior aspect of the right lung, which have also slightly decreased in prominence compared to the prior examination. There is a focal nodular opacity in the medial segment of the right middle lobe, which is less pronounced than the prior examination, most compatible with an area of chronic scarring. There is also some mild scarring in the inferior segment of the lingula. No definite large suspicious appearing pulmonary nodules or masses are noted. There are a few small pulmonary nodules scattered throughout the lungs bilaterally, largest of which is in the left lower lobe measuring 7 x 5 mm (mean diameter of 6 mm) on image 249 of series 5. Mild diffuse bronchial wall thickening with very mild centrilobular and paraseptal emphysema. In addition, there is a pattern of diffuse centrilobular ground-glass attenuation micronodularity. No pleural effusions.  Upper Abdomen: Status post cholecystectomy.  Musculoskeletal: There are no aggressive appearing lytic or blastic lesions noted in the visualized portions of the skeleton.  IMPRESSION: 1. Multiple small pulmonary nodules in lungs bilaterally, largest of which has a mean diameter of 6 mm in the left lower lobe (Image 249 of series 5). Lung-RADS Category 3, probably benign findings. Short-term follow up in 6 months is recommended with low-dose chest CT without contrast (please use the following order, "CT chest low-dose screening follow-up").  2. Mild diffuse bronchial wall thickening with very mild centrilobular and paraseptal emphysema; imaging findings suggestive of underlying COPD. 3. In addition, there is a pattern of diffuse centrilobular ground-glass attenuation micronodularity, favored to reflect a smoking related disease such as respiratory bronchiolitis (RB), or if the patient is symptomatic, respiratory bronchiolitis interstitial lung disease (RB-ILD). 4. Atherosclerosis, including left main and 3 vessel coronary artery disease. Please note that although the presence of coronary artery calcium documents the presence of coronary artery disease, the severity of this disease and any potential stenosis cannot be assessed on this non-gated CT examination. Assessment for potential risk factor modification, dietary therapy or pharmacologic therapy may be warranted, if clinically indicated. 5. Additional findings,  as above.   Electronically Signed   By: Vinnie Langton M.D.   On: 06/13/2014 17:08    Microbiology: Recent Results (from the past 240 hour(s))  CULTURE, BLOOD (ROUTINE X 2)     Status: None   Collection Time    07/07/14 12:00 PM      Result Value Ref Range Status   Specimen Description BLOOD LEFT HAND   Final   Special Requests BOTTLES DRAWN AEROBIC AND ANAEROBIC 10 CC   Final   Culture  Setup Time     Final   Value: 07/07/2014 18:44     Performed at [facility]   Culture     Final   Value:        BLOOD CULTURE RECEIVED NO GROWTH TO DATE CULTURE WILL BE HELD FOR 5 DAYS BEFORE ISSUING A FINAL NEGATIVE REPORT     Performed at [facility]   Report Status PENDING   Incomplete  CULTURE, BLOOD (ROUTINE X 2)     Status: None   Collection Time    07/07/14 12:25 PM      Result Value Ref Range Status   Specimen Description BLOOD LEFT HAND   Final   Special Requests BOTTLES DRAWN AEROBIC AND ANAEROBIC 10 CC   Final   Culture  Setup Time     Final   Value: 07/07/2014 18:44     Performed at [facility]   Culture     Final   Value:        BLOOD CULTURE RECEIVED NO GROWTH TO DATE CULTURE WILL BE HELD FOR 5 DAYS BEFORE ISSUING A FINAL NEGATIVE REPORT     Performed at [facility]   Report Status PENDING   Incomplete  CULTURE, EXPECTORATED SPUTUM-ASSESSMENT     Status: None   Collection Time    07/08/14  2:38 PM      Result Value Ref Range Status   Specimen Description SPUTUM   Final   Special Requests NONE   Final   Sputum evaluation     Final   Value: THIS SPECIMEN IS ACCEPTABLE. RESPIRATORY CULTURE REPORT TO FOLLOW.   Report Status 07/08/2014 FINAL   Final  CULTURE, RESPIRATORY (NON-EXPECTORATED)     Status: None   Collection Time    07/08/14  2:38 PM      Result Value Ref Range Status   Specimen Description SPUTUM   Final   Special Requests NONE   Final   Gram Stain     Final   Value: ABUNDANT WBC PRESENT, PREDOMINANTLY PMN     RARE SQUAMOUS EPITHELIAL CELLS PRESENT     FEW GRAM POSITIVE COCCI     IN PAIRS     Performed at [facility]   Culture     Final   Value: Culture reincubated for better growth     Performed at [facility]   Report Status PENDING   Incomplete     Labs: Basic Metabolic Panel:  Recent Labs Lab 07/07/14 0855 07/08/14 0352 07/10/14 0337  NA 137 139 137  K 4.1 3.7 4.6  CL 95* 98 97  CO2 30 30 31   GLUCOSE 89 100* 111*  BUN 15 12 16   CREATININE 1.26* 0.93 0.88  CALCIUM 9.8 9.9 9.2   Liver Function Tests: No results found for this basename: AST, ALT, ALKPHOS, BILITOT, PROT, ALBUMIN,  in the last 168 hours No results found for this basename: LIPASE, AMYLASE,  in the  last 168 hours No results found for this basename: AMMONIA,  in the last 168 hours CBC:  Recent Labs Lab 07/07/14 0855 07/08/14 0352 07/10/14 0337  WBC 14.4* 10.1 7.5  HGB 13.6 11.8* 11.7*  HCT 41.2 35.5* 35.7*  MCV 97.6 95.9 97.8  PLT 252 240 303   Cardiac Enzymes:  Recent Labs Lab 07/07/14 0855  TROPONINI <0.30   BNP: BNP (last 3  results)  Recent Labs  07/07/14 0855  PROBNP 293.8*   CBG: No results found for this basename: GLUCAP,  in the last 168 hours     Signed:  Charlynne Cousins  [facility] 07/11/2014, 10:30 AM

## 2014-07-11 NOTE — Progress Notes (Signed)
Patient slept some throughout the night and is alert and oriented up adlib.  Patient had no complaints.

## 2014-07-12 LAB — CULTURE, RESPIRATORY W GRAM STAIN

## 2014-07-12 LAB — CULTURE, RESPIRATORY

## 2014-07-13 LAB — CULTURE, BLOOD (ROUTINE X 2)
Culture: NO GROWTH
Culture: NO GROWTH

## 2014-07-18 ENCOUNTER — Ambulatory Visit (INDEPENDENT_AMBULATORY_CARE_PROVIDER_SITE_OTHER): Payer: Medicare Other | Admitting: Internal Medicine

## 2014-07-18 ENCOUNTER — Encounter: Payer: Self-pay | Admitting: Internal Medicine

## 2014-07-18 ENCOUNTER — Ambulatory Visit (INDEPENDENT_AMBULATORY_CARE_PROVIDER_SITE_OTHER)
Admission: RE | Admit: 2014-07-18 | Discharge: 2014-07-18 | Disposition: A | Discharge status: Home / Self Care | Payer: Medicare Other | Source: Ambulatory Visit | Attending: Internal Medicine | Admitting: Internal Medicine

## 2014-07-18 VITALS — BP 110/70 | HR 86 | Temp 98.0°F | Resp 16 | Ht 63.0 in | Wt 130.8 lb

## 2014-07-18 DIAGNOSIS — J189 Pneumonia, unspecified organism: Secondary | ICD-10-CM

## 2014-07-18 DIAGNOSIS — I10 Essential (primary) hypertension: Secondary | ICD-10-CM

## 2014-07-18 DIAGNOSIS — I2510 Atherosclerotic heart disease of native coronary artery without angina pectoris: Secondary | ICD-10-CM

## 2014-07-18 NOTE — Progress Notes (Signed)
Subjective:    Patient ID: [name], female    DOB: 07/16/1946, 68 y.o.   MRN: [id number]  Cough This is a recurrent problem. The current episode started 1 to 4 weeks ago. The problem has been gradually improving. The problem occurs every few hours. The cough is productive of sputum. Associated symptoms include shortness of breath and wheezing. Pertinent negatives include no chest pain, chills, ear pain, fever, headaches, heartburn, hemoptysis, myalgias, nasal congestion, postnasal drip, rash, rhinorrhea, sore throat, sweats or weight loss. The symptoms are aggravated by cold air. She has tried a beta-agonist inhaler, steroid inhaler, ipratropium inhaler and oral steroids (levaquin) for the symptoms. The treatment provided significant relief. Her past medical history is significant for COPD, emphysema and pneumonia. There is no history of asthma, bronchiectasis, bronchitis or environmental allergies.      Review of Systems  Constitutional: Positive for fatigue. Negative for fever, chills, weight loss, diaphoresis, activity change and appetite change.  HENT: Negative.  Negative for ear pain, postnasal drip, rhinorrhea, sinus pressure, sore throat, trouble swallowing and voice change.   Eyes: Negative.   Respiratory: Positive for cough, shortness of breath and wheezing. Negative for apnea, hemoptysis, choking, chest tightness and stridor.   Cardiovascular: Negative.  Negative for chest pain, palpitations and leg swelling.  Gastrointestinal: Negative.  Negative for heartburn, nausea, vomiting, abdominal pain, diarrhea, constipation and blood in stool.  Endocrine: Negative.   Genitourinary: Negative.   Musculoskeletal: Negative.  Negative for arthralgias, back pain, joint swelling and myalgias.  Skin: Negative.  Negative for rash.  Allergic/Immunologic: Negative.  Negative for environmental allergies.  Neurological: Negative.  Negative for headaches.  Hematological: Negative.  Negative  for adenopathy. Does not bruise/bleed easily.  Psychiatric/Behavioral: Positive for sleep disturbance and dysphoric mood. Negative for suicidal ideas, hallucinations, behavioral problems, confusion, self-injury, decreased concentration and agitation. The patient is nervous/anxious. The patient is not hyperactive.        Objective:   Physical Exam  Vitals reviewed. Constitutional: She is oriented to person, place, and time. She appears well-developed and well-nourished.  Non-toxic appearance. She does not have a sickly appearance. She does not appear ill. No distress.  HENT:  Head: Normocephalic and atraumatic.  Mouth/Throat: Oropharynx is clear and moist. No oropharyngeal exudate.  Eyes: Conjunctivae are normal. Right eye exhibits no discharge. Left eye exhibits no discharge. No scleral icterus.  Neck: Normal range of motion. Neck supple. No JVD present. No tracheal deviation present. No thyromegaly present.  Cardiovascular: Normal rate, regular rhythm, S1 normal, S2 normal and intact distal pulses.  Exam reveals no gallop and no friction rub.   Murmur heard.  Decrescendo systolic murmur is present with a grade of 1/6   No diastolic murmur is present  Pulmonary/Chest: Effort normal. No accessory muscle usage or stridor. Not tachypneic. No respiratory distress. She has no decreased breath sounds. She has wheezes in the right middle field and the left middle field. She has rhonchi in the right middle field and the left middle field. She has no rales.  Abdominal: Soft. Bowel sounds are normal. She exhibits no distension and no mass. There is no tenderness. There is no rebound and no guarding.  Musculoskeletal: Normal range of motion. She exhibits no edema and no tenderness.  Lymphadenopathy:    She has no cervical adenopathy.  Neurological: She is oriented to person, place, and time.  Skin: Skin is warm and dry. No rash noted. She is not diaphoretic. No erythema. No pallor.  Psychiatric: She  has a normal mood and affect. Her behavior is normal. Judgment and thought content normal.    Lab Results  Component Value Date   WBC 7.5 07/10/2014   HGB 11.7* 07/10/2014   HCT 35.7* 07/10/2014   PLT 303 07/10/2014   GLUCOSE 111* 07/10/2014   CHOL 269* 12/19/2013   TRIG 113.0 12/19/2013   HDL 77.80 12/19/2013   LDLCALC 169* 12/19/2013   ALT 18 06/17/2014   AST 22 06/17/2014   NA 137 07/10/2014   K 4.6 07/10/2014   CL 97 07/10/2014   CREATININE 0.88 07/10/2014   BUN 16 07/10/2014   CO2 31 07/10/2014   TSH 1.22 12/19/2013   INR 0.87 09/05/2013        Assessment & Plan:

## 2014-07-18 NOTE — Patient Instructions (Signed)

## 2014-07-19 ENCOUNTER — Other Ambulatory Visit: Payer: Self-pay | Admitting: *Deleted

## 2014-07-19 ENCOUNTER — Telehealth: Payer: Self-pay

## 2014-07-19 ENCOUNTER — Telehealth: Payer: Self-pay | Admitting: Internal Medicine

## 2014-07-19 NOTE — Telephone Encounter (Signed)
emmi mailed  °

## 2014-07-19 NOTE — Telephone Encounter (Signed)
Initiated appeal for Fioricet  Informed pt that appeal process has been started.

## 2014-07-21 NOTE — Assessment & Plan Note (Signed)
Her BP is well controlled 

## 2014-07-21 NOTE — Assessment & Plan Note (Signed)
Improvement noted on exam and CXR She will cont the current inhalers for now

## 2014-07-22 NOTE — Progress Notes (Signed)
Pre visit review using our clinic review tool, if applicable. No additional management support is needed unless otherwise documented below in the visit note. 

## 2014-08-06 ENCOUNTER — Ambulatory Visit (INDEPENDENT_AMBULATORY_CARE_PROVIDER_SITE_OTHER): Payer: Medicare Other | Admitting: Internal Medicine

## 2014-08-06 ENCOUNTER — Encounter: Payer: Self-pay | Admitting: Internal Medicine

## 2014-08-06 VITALS — BP 100/68 | HR 87 | Ht 62.0 in | Wt 132.3 lb

## 2014-08-06 DIAGNOSIS — E785 Hyperlipidemia, unspecified: Secondary | ICD-10-CM

## 2014-08-06 DIAGNOSIS — I5032 Chronic diastolic (congestive) heart failure: Secondary | ICD-10-CM

## 2014-08-06 DIAGNOSIS — J9611 Chronic respiratory failure with hypoxia: Secondary | ICD-10-CM

## 2014-08-06 DIAGNOSIS — J438 Other emphysema: Secondary | ICD-10-CM

## 2014-08-06 DIAGNOSIS — I2510 Atherosclerotic heart disease of native coronary artery without angina pectoris: Secondary | ICD-10-CM

## 2014-08-06 DIAGNOSIS — I350 Nonrheumatic aortic (valve) stenosis: Secondary | ICD-10-CM

## 2014-08-06 DIAGNOSIS — E782 Mixed hyperlipidemia: Secondary | ICD-10-CM

## 2014-08-06 DIAGNOSIS — I10 Essential (primary) hypertension: Secondary | ICD-10-CM

## 2014-08-06 DIAGNOSIS — I2583 Coronary atherosclerosis due to lipid rich plaque: Principal | ICD-10-CM

## 2014-08-06 DIAGNOSIS — Z720 Tobacco use: Secondary | ICD-10-CM

## 2014-08-06 NOTE — Progress Notes (Signed)
OFFICE NOTE  Chief Complaint:  Hospital follow-up  Primary Care Physician: Scarlette Calico, MD  HPI:  [name] a pleasant 68 year old female with a history of COPD and severe pulmonary hypertension. Recently she was put on home oxygen and has been on an oxygen concentrator, which has made a marked improvement in her ability to get around. She has previously seen Dr. Lamonte Sakai with pulmonology; however, that did not go too well and she has basically given up on any further treatments for COPD other than her oxygen. She still uses nebulizers as needed when she is tight, and I recommended Claritin for seasonal allergies today. As you know, she also has mild aortic stenosis and we are continuing to follow that. She is a low-risk stress test in November 2012. Unfortunately she was recently admitted for altered mental status, possible Tylenol overdose, respiratory failure and liver failure. All of which seems to have resolved. She seems to be doing pre-well after discharge.  She returns today for followup. She reports doing fairly well. She has seen Dr. Lake Bells in the interim and he is recommended continue current therapies. She is now seeing a different internist, Dr. Ronnald Ramp. He was recently admitted the hospital for pneumonia and treated accordingly. She had troponins which were negative suggesting they're likely is no significant underlying obstructive coronary disease. She reports she's recovered and is back to her base. She is overdue for repeat lipid profile.  PMHx:  Past Medical History  Diagnosis Date  . Angina   . Heart murmur   . Depressed   . Coronary artery disease 2006    2 stents w/previous MI  . Hypertension   . Migraine   . Anxiety   . Myocardial infarction 2006    NSTEMI  . COPD (chronic obstructive pulmonary disease)     oxygen-dependent 4LPM [place]  . Moderate to severe pulmonary hypertension   . Diastolic CHF   . Cancer of breast dx'd 2008    RT Breast  . Aortic  stenosis     mild  . Dyslipidemia   . NSVT (nonsustained ventricular tachycardia)     h/o  . History of nuclear stress test 08/03/2011    attenuation at apex - no perfusion defects   . Abnormal LFTs 08/02/2011  . Acute liver failure 06/19/2013  . Acute renal failure 06/19/2013  . Acute respiratory failure 06/19/2013  . Altered mental status 08/01/2011  . Anxiety state, unspecified 12/03/2013  . Breast cancer of upper-outer quadrant of left female breast 08/21/2013  . CAD (coronary artery disease) of artery bypass graft 11/06/2013  . CAD (coronary artery disease), MI R/O 08/01/2011    PCI in 2006 (bare metal stent, unknown artery) - [place]   . CHF,  acute diastolic, BNP 4k on admissio 08/01/2011  . Chronic respiratory failure 02/02/2012  . Compression fracture of L1 lumbar vertebra 02/02/2012  . Mild aortic stenosis 08/01/2011    AVA 1.69 cm2 (06/02/2011)   . Hyponatremia 01/31/2012  . Hyperkalemia, on ACE prior to admission 11/11/2011  . HTN (hypertension) 11/06/2013  . History of breast cancer in female 06/23/2013  . 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 Breast lumpectomy Right 2008  . Cardiac catheterization  2006  . Cholecystectomy  2011  . Abdominal surgery  2009  . Transthoracic echocardiogram  11/12/2011    EF 44-96%, normal systolic function, grade 1 diastolic dysfunction; ventricular septal flattening (D-sign); mild AS; trace-mild MR;  LA mildly dilated; RV mod dilated; RA mod dilated; severe pulm HTN; elevated CVP    FAMHx:  Family History  Problem Relation Age of Onset  . Schizophrenia Sister   . Alzheimer's disease Mother   . Hyperlipidemia Brother   . Heart disease Sister   . Diabetes Sister   . Cancer Neg Hx   . Stroke Neg Hx   . COPD Neg Hx   . Depression Neg Hx   . Drug abuse Neg Hx   . Early death Neg Hx   . Hypertension Neg Hx   . Kidney disease Neg Hx     SOCHx:   reports that she has been smoking Cigarettes.  She  has a 11.75 pack-year smoking history. She has never used smokeless tobacco. She reports that she does not drink alcohol or use illicit drugs.  ALLERGIES:  Allergies  Allergen Reactions  . Hydroxyzine Shortness Of Breath and Other (See Comments)    Pt states med make her light headed, get sob sxs  . Ceftriaxone Other (See Comments)    "Blow up like a balloon"  . Lexapro [Escitalopram] Other (See Comments)    Pt states med make her dizzy    ROS: A comprehensive review of systems was negative except for: Respiratory: positive for dyspnea on exertion Gastrointestinal: positive for gas  HOME MEDS: Current Outpatient Prescriptions  Medication Sig Dispense Refill  . albuterol (PROVENTIL HFA;VENTOLIN HFA) 108 (90 BASE) MCG/ACT inhaler Inhale 2 puffs into the lungs every 4 (four) hours as needed for shortness of breath. Shortness of breath and wheezing  Patient is using Pro-Air brand    . albuterol (PROVENTIL) (5 MG/ML) 0.5% nebulizer solution Take 2.5 mg by nebulization every 2 (two) hours as needed for wheezing or shortness of breath.    . ALPRAZolam (XANAX) 1 MG tablet Take 1 tablet (1 mg total) by mouth 3 (three) times daily as needed for sleep or anxiety. 90 tablet 3  . aspirin 325 MG EC tablet Take 325 mg by mouth every morning.     Marland Kitchen aspirin-acetaminophen-caffeine (EXCEDRIN MIGRAINE) 250-250-65 MG per tablet Take 2 tablets by mouth daily as needed for headache.    . budesonide-formoterol (SYMBICORT) 160-4.5 MCG/ACT inhaler Inhale 2 puffs into the lungs 2 (two) times daily. 3 Inhaler 3  . butalbital-acetaminophen-caffeine (FIORICET) 50-325-40 MG per tablet Take 1-2 pills as needed for headache 30 tablet 5  . calcium-vitamin D (OSCAL WITH D) 250-125 MG-UNIT per tablet Take 1 tablet by mouth every morning.     . denosumab (XGEVA) 120 MG/1.7ML SOLN injection Inject 120 mg into the skin once.    . diclofenac sodium (VOLTAREN) 1 % GEL Apply 1 application topically daily as needed (apply to  hands). Hand pain    . furosemide (LASIX) 40 MG tablet Take 40 mg by mouth 2 (two) times daily.    Marland Kitchen ipratropium (ATROVENT HFA) 17 MCG/ACT inhaler Inhale 2 puffs into the lungs 3 (three) times daily.    Marland Kitchen lactulose, encephalopathy, (CHRONULAC) 10 GM/15ML SOLN Take 20 g by mouth 3 (three) times daily as needed (Constipation).     . montelukast (SINGULAIR) 10 MG tablet Take 10 mg by mouth at bedtime.    . Multiple Vitamins-Minerals (MULTIVITAMIN WITH MINERALS) tablet Take 1 tablet by mouth every morning.     . nortriptyline (PAMELOR) 25 MG capsule Take 2 capsules (50 mg total) by mouth at bedtime. 60 capsule 3  . Omega 3 1200 MG CAPS Take 1,200 mg by mouth every  morning.     . ondansetron (ZOFRAN) 8 MG tablet Take 1 tablet (8 mg total) by mouth 2 (two) times daily as needed for nausea or vomiting. 30 tablet 11  . OXYGEN-HELIUM IN Inhale 3 L into the lungs as needed.    . pantoprazole (PROTONIX) 40 MG tablet Take 40 mg by mouth every morning.    . Pitavastatin Calcium (LIVALO) 2 MG TABS Take 2 mg by mouth every evening.     . potassium chloride SA (K-DUR,KLOR-CON) 20 MEQ tablet Take 20 mEq by mouth every evening.      No current facility-administered medications for this visit.    LABS/IMAGING: No results found for this or any previous visit (from the past 48 hour(s)). No results found.  VITALS: BP 100/68 mmHg  Pulse 87  Ht 5\' 2"  (1.575 m)  Wt 132 lb 4.8 oz (60.011 kg)  BMI 24.19 kg/m2  EXAM: General appearance: alert, appears older than stated age, no distress and on oxygen by nasal cannula Neck: no carotid bruit and no JVD Lungs: diminished breath sounds bilaterally and faint rhonchi Heart: regular rate and rhythm, S1, S2 normal and systolic murmur: early systolic 2/6, crescendo at 2nd right intercostal space Abdomen: soft, non-tender; bowel sounds normal; no masses,  no organomegaly Extremities: extremities normal, atraumatic, no cyanosis or edema Pulses: 2+ and symmetric Skin:  Skin color, texture, turgor normal. No rashes or lesions Neurologic: Grossly normal Psych: At baseline mood, affect  EKG: Normal sinus rhythm at 87, RSR in V1,V2  ASSESSMENT: 1. Severe COPD with severe pulmonary hypertension, on chronic oxygen with recent exacerbation 2. Mild aortic stenosis 3. Low risk nuclear stress testing in 2012 4. Dyslipidemia - recently started back on lipitor 5. History of CAD with prior remote PCI  PLAN: 1.   Ms. Yip has recovered from a recent COPD exacerbation.  While in the hospital she had troponins obtained which were negative. I do not feel that she has active obstructive coronary disease at this time. As mentioned her previous stress test was negative in 2012. She does have mild aortic stenosis which is stable on exam. She is due for repeat lipid profile which we will order today. She should continue on Livalo.  Plan to see her back in 6 months.  Pixie Casino, MD, [facility] Attending Cardiologist [facility]  Berlie Hatchel C 08/06/2014, 2:56 PM

## 2014-08-06 NOTE — Patient Instructions (Signed)
Dr Debara Pickett has ordered the following test(s) to be done:  NMR Lipoprofile to be done FASTING  Dr Debara Pickett wants you to follow-up in 6 months. You will receive a reminder letter in the mail one months in advance. If you don't receive a letter, please call our office to schedule the follow-up appointment.

## 2014-08-14 ENCOUNTER — Telehealth: Payer: Self-pay | Admitting: *Deleted

## 2014-08-14 DIAGNOSIS — G43709 Chronic migraine without aura, not intractable, without status migrainosus: Secondary | ICD-10-CM

## 2014-08-14 MED ORDER — BUTALBITAL-APAP-CAFFEINE 50-325-40 MG PO TABS: ORAL_TABLET | ORAL | Status: DC

## 2014-08-14 NOTE — Telephone Encounter (Signed)
Will send the prescription to your pharmacy

## 2014-08-14 NOTE — Telephone Encounter (Signed)
Notified pt  rx being [facility] to [facility].../lmb

## 2014-08-14 NOTE — Telephone Encounter (Signed)
Pt states she has been trying to get her firocet the PA was approved but insurance will only cover for # 88. Needing a updated script sent to her pharmacy for # 59...[name]

## 2014-08-26 ENCOUNTER — Other Ambulatory Visit: Payer: Self-pay | Admitting: Internal Medicine

## 2014-08-28 ENCOUNTER — Other Ambulatory Visit: Payer: Self-pay | Admitting: Internal Medicine

## 2014-08-30 ENCOUNTER — Ambulatory Visit: Payer: Medicare Other | Admitting: Neurology

## 2014-09-02 ENCOUNTER — Encounter: Payer: Self-pay | Admitting: *Deleted

## 2014-09-02 ENCOUNTER — Telehealth: Payer: Self-pay | Admitting: Neurology

## 2014-09-02 NOTE — Telephone Encounter (Signed)
Pt no showed 08/30/14 appt w/ Dr. Tomi Likens.   Danae Chen - please send no show lettrer / Sherri S.

## 2014-09-02 NOTE — Progress Notes (Unsigned)
No show letter sent for 08/30/2014

## 2014-09-04 ENCOUNTER — Telehealth: Payer: Self-pay | Admitting: Internal Medicine

## 2014-09-04 ENCOUNTER — Other Ambulatory Visit: Payer: Self-pay | Admitting: Internal Medicine

## 2014-09-04 NOTE — Telephone Encounter (Signed)
   This is a high risk, controlled medication which can be prescribed long-term only by the patient's primary care physician. In the physician's absence only #30 can be prescribed

## 2014-09-04 NOTE — Telephone Encounter (Signed)
Duplicate msg med has been call in see previous msg dr. hopper approved...Johny Chess

## 2014-09-04 NOTE — Telephone Encounter (Signed)
Pt called in and said that she never got the script for her ALPRAZolam Duanne Moron) 1 MG tablet [[id number]] back in nov.  [facility] said that [facility].

## 2014-09-04 NOTE — Telephone Encounter (Signed)
MD out of office. Okay for refill? 

## 2014-09-04 NOTE — Telephone Encounter (Signed)
Alprazolam #30 has been called to [facility]

## 2014-09-05 ENCOUNTER — Encounter (HOSPITAL_COMMUNITY): Payer: Self-pay | Admitting: Internal Medicine

## 2014-09-05 ENCOUNTER — Other Ambulatory Visit: Payer: Self-pay | Admitting: Internal Medicine

## 2014-09-13 ENCOUNTER — Other Ambulatory Visit: Payer: Self-pay | Admitting: Internal Medicine

## 2014-09-13 NOTE — Telephone Encounter (Signed)
Rx was sent to pharmacy electronically. 

## 2014-09-15 ENCOUNTER — Other Ambulatory Visit: Payer: Self-pay | Admitting: Internal Medicine

## 2014-09-18 ENCOUNTER — Other Ambulatory Visit: Payer: Self-pay | Admitting: Internal Medicine

## 2014-09-18 DIAGNOSIS — Z853 Personal history of malignant neoplasm of breast: Secondary | ICD-10-CM

## 2014-09-24 ENCOUNTER — Other Ambulatory Visit: Payer: Self-pay | Admitting: Neurology

## 2014-10-01 ENCOUNTER — Telehealth: Payer: Self-pay | Admitting: Internal Medicine

## 2014-10-01 NOTE — Telephone Encounter (Signed)
Pt requesting refill of Xanex.

## 2014-10-02 ENCOUNTER — Ambulatory Visit
Admission: RE | Admit: 2014-10-02 | Discharge: 2014-10-02 | Disposition: A | Discharge status: Home / Self Care | Payer: Medicare Other | Source: Ambulatory Visit | Attending: Internal Medicine | Admitting: Internal Medicine

## 2014-10-02 DIAGNOSIS — Z853 Personal history of malignant neoplasm of breast: Secondary | ICD-10-CM

## 2014-10-02 LAB — HM MAMMOGRAPHY: HM Mammogram: NORMAL

## 2014-10-05 ENCOUNTER — Other Ambulatory Visit: Payer: Self-pay | Admitting: Internal Medicine

## 2014-10-11 ENCOUNTER — Other Ambulatory Visit (INDEPENDENT_AMBULATORY_CARE_PROVIDER_SITE_OTHER): Payer: Medicare Other

## 2014-10-11 ENCOUNTER — Encounter: Payer: Self-pay | Admitting: Internal Medicine

## 2014-10-11 ENCOUNTER — Ambulatory Visit (INDEPENDENT_AMBULATORY_CARE_PROVIDER_SITE_OTHER): Payer: Medicare Other | Admitting: Internal Medicine

## 2014-10-11 VITALS — BP 110/70 | HR 87 | Temp 98.3°F | Resp 16 | Ht 62.0 in | Wt 129.0 lb

## 2014-10-11 DIAGNOSIS — I2510 Atherosclerotic heart disease of native coronary artery without angina pectoris: Secondary | ICD-10-CM

## 2014-10-11 DIAGNOSIS — F411 Generalized anxiety disorder: Secondary | ICD-10-CM

## 2014-10-11 DIAGNOSIS — E785 Hyperlipidemia, unspecified: Secondary | ICD-10-CM

## 2014-10-11 DIAGNOSIS — R739 Hyperglycemia, unspecified: Secondary | ICD-10-CM

## 2014-10-11 DIAGNOSIS — I10 Essential (primary) hypertension: Secondary | ICD-10-CM

## 2014-10-11 LAB — BASIC METABOLIC PANEL
BUN: 16 mg/dL (ref 6–23)
CO2: 34 mEq/L — ABNORMAL HIGH (ref 19–32)
Calcium: 10 mg/dL (ref 8.4–10.5)
Chloride: 100 mEq/L (ref 96–112)
Creatinine, Ser: 0.94 mg/dL (ref 0.40–1.20)
GFR: 62.79 mL/min (ref >60.00)
Glucose, Bld: 97 mg/dL (ref 70–99)
Potassium: 4 mEq/L (ref 3.5–5.1)
Sodium: 139 mEq/L (ref 135–145)

## 2014-10-11 LAB — HEMOGLOBIN A1C: Hgb A1c MFr Bld: 5.6 % (ref 4.6–6.5)

## 2014-10-11 MED ORDER — ALPRAZOLAM 1 MG PO TABS: ORAL_TABLET | ORAL | Status: DC

## 2014-10-11 NOTE — Progress Notes (Signed)
   Subjective:    Patient ID: [name], female    DOB: [birth year], 69 y.o.   MRN: [id number]  Hyperlipidemia This is a chronic problem. The current episode started more than 1 year ago. The problem is controlled. Recent lipid tests were reviewed and are variable. She has no history of chronic renal disease, diabetes, hypothyroidism, liver disease, obesity or nephrotic syndrome. Factors aggravating her hyperlipidemia include smoking. Associated symptoms include shortness of breath. Pertinent negatives include no chest pain, focal sensory loss, focal weakness, leg pain or myalgias. Current antihyperlipidemic treatment includes statins. The current treatment provides moderate improvement of lipids. There are no compliance problems.       Review of Systems  Constitutional: Negative.  Negative for fever, chills, diaphoresis, appetite change and fatigue.  HENT: Negative.   Eyes: Negative.   Respiratory: Positive for shortness of breath. Negative for cough, choking, chest tightness, wheezing and stridor.   Cardiovascular: Negative.  Negative for chest pain, palpitations and leg swelling.  Gastrointestinal: Negative.  Negative for nausea, vomiting, abdominal pain, diarrhea and constipation.  Endocrine: Negative.   Genitourinary: Negative.   Musculoskeletal: Negative.  Negative for myalgias, back pain, arthralgias and gait problem.  Skin: Negative.  Negative for rash.  Allergic/Immunologic: Negative.   Neurological: Negative.  Negative for focal weakness.  Hematological: Negative.  Negative for adenopathy. Does not bruise/bleed easily.  Psychiatric/Behavioral: Negative for suicidal ideas, behavioral problems, sleep disturbance, self-injury, dysphoric mood and agitation. The patient is nervous/anxious.        Objective:   Physical Exam  Constitutional: She is oriented to person, place, and time. She appears well-developed and well-nourished. No distress.  HENT:  Head: Normocephalic and  atraumatic.  Mouth/Throat: Oropharynx is clear and moist. No oropharyngeal exudate.  Eyes: Conjunctivae are normal. Right eye exhibits no discharge. Left eye exhibits no discharge. No scleral icterus.  Neck: Normal range of motion. Neck supple. No JVD present. No tracheal deviation present. No thyromegaly present.  Cardiovascular: Normal rate and intact distal pulses.  Exam reveals no gallop and no friction rub.   No murmur heard. Pulmonary/Chest: Effort normal. No accessory muscle usage or stridor. No respiratory distress. She has no decreased breath sounds. She has no wheezes. She has rhonchi in the right middle field and the left middle field. She has no rales.  Abdominal: Soft. Bowel sounds are normal. She exhibits no distension and no mass. There is no tenderness. There is no rebound and no guarding.  Musculoskeletal: Normal range of motion. She exhibits no edema or tenderness.  Lymphadenopathy:    She has no cervical adenopathy.  Neurological: She is oriented to person, place, and time.  Skin: Skin is warm and dry. No rash noted. She is not diaphoretic. No erythema. No pallor.  Vitals reviewed.    Lab Results  Component Value Date   WBC 7.5 07/10/2014   HGB 11.7* 07/10/2014   HCT 35.7* 07/10/2014   PLT 303 07/10/2014   GLUCOSE 111* 07/10/2014   CHOL 269* 12/19/2013   TRIG 113.0 12/19/2013   HDL 77.80 12/19/2013   LDLCALC 169* 12/19/2013   ALT 18 06/17/2014   AST 22 06/17/2014   NA 137 07/10/2014   K 4.6 07/10/2014   CL 97 07/10/2014   CREATININE 0.88 07/10/2014   BUN 16 07/10/2014   CO2 31 07/10/2014   TSH 1.22 12/19/2013   INR 0.87 09/05/2013       Assessment & Plan:

## 2014-10-11 NOTE — Assessment & Plan Note (Signed)
She has no CP today Will check her NMR panel as requested

## 2014-10-11 NOTE — Assessment & Plan Note (Signed)
She brings with her a request to have an NMR panel, requested by her cardiologist Will order this and forward the results to him

## 2014-10-11 NOTE — Patient Instructions (Signed)
High Cholesterol High cholesterol refers to having a high level of cholesterol in your blood. Cholesterol is a white, waxy, fat-like protein that your body needs in small amounts. Your liver makes all the cholesterol you need. Excess cholesterol comes from the food you eat. Cholesterol travels in your bloodstream through your blood vessels. If you have high cholesterol, deposits (plaque) may build up on the walls of your blood vessels. This makes the arteries narrower and stiffer. Plaque increases your risk of heart attack and stroke. Work with your health care provider to keep your cholesterol levels in a healthy range. RISK FACTORS Several things can make you more likely to have high cholesterol. These include:   Eating foods high in animal fat (saturated fat) or cholesterol.  Being overweight.  Not getting enough exercise.  Having a family history of high cholesterol. SIGNS AND SYMPTOMS High cholesterol does not cause symptoms. DIAGNOSIS  Your health care provider can do a blood test to check whether you have high cholesterol. If you are older than 20, your health care provider may check your cholesterol every 4-6 years. You may be checked more often if you already have high cholesterol or other risk factors for heart disease. The blood test for cholesterol measures the following:  Bad cholesterol (LDL cholesterol). This is the type of cholesterol that causes heart disease. This number should be less than 100.  Good cholesterol (HDL cholesterol). This type helps protect against heart disease. A healthy level of HDL cholesterol is 60 or higher.  Total cholesterol. This is the combined number of LDL cholesterol and HDL cholesterol. A healthy number is less than 200. TREATMENT  High cholesterol can be treated with diet changes, lifestyle changes, and medicine.   Diet changes may include eating more whole grains, fruits, vegetables, nuts, and fish. You may also have to cut back on red meat  and foods with a lot of added sugar.  Lifestyle changes may include getting at least 40 minutes of aerobic exercise three times a week. Aerobic exercises include walking, biking, and swimming. Aerobic exercise along with a healthy diet can help you maintain a healthy weight. Lifestyle changes may also include quitting smoking.  If diet and lifestyle changes are not enough to lower your cholesterol, your health care provider may prescribe a statin medicine. This medicine has been shown to lower cholesterol and also lower the risk of heart disease. HOME CARE INSTRUCTIONS  Only take over-the-counter or prescription medicines as directed by your health care provider.   Follow a healthy diet as directed by your health care provider. For instance:   Eat chicken (without skin), fish, veal, shellfish, ground turkey breast, and round or loin cuts of red meat.  Do not eat fried foods and fatty meats, such as hot dogs and salami.   Eat plenty of fruits, such as apples.   Eat plenty of vegetables, such as broccoli, potatoes, and carrots.   Eat beans, peas, and lentils.   Eat grains, such as barley, rice, couscous, and bulgur wheat.   Eat pasta without cream sauces.   Use skim or nonfat milk and low-fat or nonfat yogurt and cheeses. Do not eat or drink whole milk, cream, ice cream, egg yolks, and hard cheeses.   Do not eat stick margarine or tub margarines that contain trans fats (also called partially hydrogenated oils).   Do not eat cakes, cookies, crackers, or other baked goods that contain trans fats.   Do not eat saturated tropical oils, such as   coconut and palm oil.   Exercise as directed by your health care provider. Increase your activity level with activities such as gardening or walking.   Keep all follow-up appointments.  SEEK MEDICAL CARE IF:  You are struggling to maintain a healthy diet or weight.  You need help starting an exercise program.  You need help to  stop smoking. SEEK IMMEDIATE MEDICAL CARE IF:  You have chest pain.  You have trouble breathing. Document Released: 09/13/2005 Document Revised: 01/28/2014 Document Reviewed: 07/06/2013 Exit[employer] Patient Information 2015 [employer]. This information is not intended to replace advice given to you by your health care provider. Make sure you discuss any questions you have with your health care provider.  

## 2014-10-11 NOTE — Assessment & Plan Note (Signed)
I will check her A1C to see if she has developed DM2 

## 2014-10-11 NOTE — Assessment & Plan Note (Signed)
Her BP is well controlled I will monitor her lytes and renal function 

## 2014-10-15 LAB — NMR LIPOPROFILE WITH LIPIDS
Cholesterol, Total: 205 mg/dL — ABNORMAL HIGH (ref 100–199)
HDL Particle Number: 31.3 umol/L (ref >=30.5)
HDL Size: 10.6 nm (ref >=9.2)
HDL-C: 77 mg/dL (ref >39)
LDL (calc): 109 mg/dL — ABNORMAL HIGH (ref 0–99)
LDL Particle Number: 1093 nmol/L — ABNORMAL HIGH (ref <1000)
LDL Size: 21.1 nm (ref >=20.8)
LP-IR Score: <25 (ref <=45)
Large HDL-P: 12.9 umol/L (ref >=4.8)
Large VLDL-P: <0.8 nmol/L (ref <=2.7)
Small LDL Particle Number: <90 nmol/L (ref <=527)
Triglycerides: 96 mg/dL (ref 0–149)
VLDL Size: 41.7 nm (ref <=46.6)

## 2014-11-28 ENCOUNTER — Inpatient Hospital Stay: Admission: RE | Admit: 2014-11-28 | Payer: Medicare Other | Source: Ambulatory Visit

## 2014-12-01 ENCOUNTER — Other Ambulatory Visit: Payer: Self-pay | Admitting: Internal Medicine

## 2014-12-03 ENCOUNTER — Ambulatory Visit: Payer: Medicare Other | Admitting: Pulmonary Disease

## 2014-12-18 ENCOUNTER — Other Ambulatory Visit: Payer: Self-pay | Admitting: Internal Medicine

## 2014-12-19 ENCOUNTER — Ambulatory Visit: Payer: Medicare Other

## 2015-01-01 ENCOUNTER — Ambulatory Visit (INDEPENDENT_AMBULATORY_CARE_PROVIDER_SITE_OTHER)
Admission: RE | Admit: 2015-01-01 | Discharge: 2015-01-01 | Disposition: A | Discharge status: Home / Self Care | Payer: Medicare Other | Source: Ambulatory Visit | Attending: Pulmonary Disease | Admitting: Pulmonary Disease

## 2015-01-01 ENCOUNTER — Telehealth: Payer: Self-pay

## 2015-01-01 DIAGNOSIS — Z122 Encounter for screening for malignant neoplasm of respiratory organs: Secondary | ICD-10-CM

## 2015-01-01 DIAGNOSIS — Z87891 Personal history of nicotine dependence: Secondary | ICD-10-CM

## 2015-01-01 DIAGNOSIS — J449 Chronic obstructive pulmonary disease, unspecified: Secondary | ICD-10-CM

## 2015-01-01 DIAGNOSIS — J984 Other disorders of lung: Secondary | ICD-10-CM

## 2015-01-01 NOTE — Telephone Encounter (Signed)
PA for atrovent sent to plan for review. Awaiting status.

## 2015-01-02 ENCOUNTER — Telehealth: Payer: Self-pay | Admitting: Pulmonary Disease

## 2015-01-02 NOTE — Progress Notes (Signed)
Quick Note:  LMTCB for pt. ______

## 2015-01-02 NOTE — Telephone Encounter (Signed)
Results have been explained to patient, pt expressed understanding. Nothing further needed.  Notes Recorded by Juanito Doom, MD on 01/01/2015 at 11:23 PM A, Please let her know that this was OK Thanks B

## 2015-01-02 NOTE — Telephone Encounter (Signed)
PA came back as pt is not covered by insurance plan.

## 2015-01-13 ENCOUNTER — Ambulatory Visit: Payer: Medicare Other

## 2015-01-13 ENCOUNTER — Ambulatory Visit (INDEPENDENT_AMBULATORY_CARE_PROVIDER_SITE_OTHER): Payer: Medicare Other | Admitting: Pulmonary Disease

## 2015-01-13 ENCOUNTER — Telehealth: Payer: Self-pay

## 2015-01-13 ENCOUNTER — Other Ambulatory Visit: Payer: Self-pay | Admitting: Internal Medicine

## 2015-01-13 ENCOUNTER — Other Ambulatory Visit: Payer: Self-pay | Admitting: Neurology

## 2015-01-13 ENCOUNTER — Telehealth: Payer: Self-pay | Admitting: Internal Medicine

## 2015-01-13 ENCOUNTER — Encounter: Payer: Self-pay | Admitting: Pulmonary Disease

## 2015-01-13 VITALS — BP 118/64 | HR 82 | Ht 62.0 in | Wt 131.0 lb

## 2015-01-13 DIAGNOSIS — J9611 Chronic respiratory failure with hypoxia: Secondary | ICD-10-CM | POA: Diagnosis not present

## 2015-01-13 DIAGNOSIS — Z720 Tobacco use: Secondary | ICD-10-CM | POA: Diagnosis not present

## 2015-01-13 DIAGNOSIS — I2510 Atherosclerotic heart disease of native coronary artery without angina pectoris: Secondary | ICD-10-CM | POA: Diagnosis not present

## 2015-01-13 DIAGNOSIS — J432 Centrilobular emphysema: Secondary | ICD-10-CM | POA: Diagnosis not present

## 2015-01-13 NOTE — Patient Instructions (Signed)
QUIT SMOKING! Keep using oxygen and symbicort We will se you back in 6 months or sooner if needed

## 2015-01-13 NOTE — Assessment & Plan Note (Signed)
She was encouraged at length to use her oxygen continuously, not as needed

## 2015-01-13 NOTE — Telephone Encounter (Signed)
Patient is requesting prolia.  Can you please check with insurance.

## 2015-01-13 NOTE — Assessment & Plan Note (Signed)
This has been a stable interval for [name] despite her GOLD Grade D COPD.  She desperately needs to quit smoking, we discussed this again today.  Plan: Continue symbicort Flu shot in fall Quit smoking

## 2015-01-13 NOTE — Telephone Encounter (Signed)
I have electronically submitted pt's info for [name] insurance verification and will notify you once I have a response. Thank you. °

## 2015-01-13 NOTE — Telephone Encounter (Signed)
Can you see if pt insurance will cover [name]?

## 2015-01-13 NOTE — Assessment & Plan Note (Signed)
CT scan Lung RADS 2, repeat in one year As above

## 2015-01-13 NOTE — Progress Notes (Signed)
Subjective:    Patient ID: [name], female    DOB: 1945-10-10, 69 y.o.   MRN: [id number]  Synopsis: [name] has COPD and requires 3L [place] at rest and is typically non-compliant with her oxygen therapy. She has never had PFTs.  We were worried about pulmonary hypertension in 2014 so we performed a RHC which showed normal pulmonary pressures.    HPI Chief Complaint  Patient presents with  . Follow-up    pt has no breathing complaints at this time. CAT score 7.   Jaz continues to smoke occasionally, she says only one every three weeks.  Her breathing has been doing OK, no trips to other doctors or to the hospital.  She hasn't had a cold.  She continues to use and benefit from her oxygen.  She uses it with sleep and exertion regularly.  She had a screening CT scan which was lung RADS 2.  She continues to use the symbicort reguarly.  She has only had to use the albuterol once in the last 3 months.    Past Medical History  Diagnosis Date  . Angina   . Heart murmur   . Depressed   . Coronary artery disease 2006    2 stents w/previous MI  . Hypertension   . Migraine   . Anxiety   . Myocardial infarction 2006    NSTEMI  . COPD (chronic obstructive pulmonary disease)     oxygen-dependent 4LPM [place]  . Moderate to severe pulmonary hypertension   . Diastolic CHF   . Cancer of breast dx'd 2008    RT Breast  . Aortic stenosis     mild  . Dyslipidemia   . NSVT (nonsustained ventricular tachycardia)     h/o  . History of nuclear stress test 08/03/2011    attenuation at apex - no perfusion defects   . Abnormal LFTs 08/02/2011  . Acute liver failure 06/19/2013  . Acute renal failure 06/19/2013  . Acute respiratory failure 06/19/2013  . Altered mental status 08/01/2011  . Anxiety state, unspecified 12/03/2013  . Breast cancer of upper-outer quadrant of left female breast 08/21/2013  . CAD (coronary artery disease) of artery bypass graft 11/06/2013  . CAD (coronary artery disease), MI R/O  08/01/2011    PCI in 2006 (bare metal stent, unknown artery) - [place]   . CHF,  acute diastolic, BNP 4k on admissio 08/01/2011  . Chronic respiratory failure 02/02/2012  . Compression fracture of L1 lumbar vertebra 02/02/2012  . Mild aortic stenosis 08/01/2011    AVA 1.69 cm2 (06/02/2011)   . Hyponatremia 01/31/2012  . Hyperkalemia, on ACE prior to admission 11/11/2011  . HTN (hypertension) 11/06/2013  . History of breast cancer in female 06/23/2013  . 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  HENT: Negative for postnasal drip, rhinorrhea and sinus pressure.   Respiratory: Negative for cough, shortness of breath and wheezing.   Cardiovascular: Negative for chest pain, palpitations and leg swelling.       Objective:   Physical Exam Filed Vitals:   01/13/15 1442  BP: 118/64  Pulse: 82  Height: 5\' 2"  (1.575 m)  Weight: 131 lb (59.421 kg)  SpO2: 96%   3L [place]  Gen: no acute distress HEENT: NCAT, oropharynx clear, neck supple EOMi, sclera anicteric PULM: scattered wheeze again, good air movement CV: RRR, no mgr AB: BS+, soft, nontender Ext: warm, acyanotic, no edema  10/2014  CT screening chest > lung RADS 2, radiation fibrosis noted.        Assessment & Plan:   COPD (chronic obstructive pulmonary disease) This has been a stable interval for Kaley despite her GOLD Grade D COPD.  She desperately needs to quit smoking, we discussed this again today.  Plan: Continue symbicort Flu shot in fall Quit smoking   Chronic respiratory failure She was encouraged at length to use her oxygen continuously, not as needed   Tobacco abuse CT scan Lung RADS 2, repeat in one year As above     Updated Medication List Outpatient Encounter Prescriptions as of 01/13/2015  Medication Sig  . albuterol (PROVENTIL) (5 MG/ML) 0.5% nebulizer solution Take 2.5 mg by nebulization every 2 (two) hours as needed for wheezing or shortness of  breath.  . ALPRAZolam (XANAX) 1 MG tablet TAKE 1 TABLET THREE TIMES A DAY AS NEEDED FOR SLEEP OR ANXIETY  . aspirin 325 MG EC tablet Take 325 mg by mouth every morning.   Marland Kitchen aspirin-acetaminophen-caffeine (EXCEDRIN MIGRAINE) 250-250-65 MG per tablet Take 2 tablets by mouth daily as needed for headache.  . ATROVENT HFA 17 MCG/ACT inhaler USE 2 PUFFS EVERY FOUR HOURS AS NEEDED  . butalbital-acetaminophen-caffeine (FIORICET) 50-325-40 MG per tablet Take 1-2 pills as needed for headache  . calcium-vitamin D (OSCAL WITH D) 250-125 MG-UNIT per tablet Take 1 tablet by mouth every morning.   . denosumab (XGEVA) 120 MG/1.7ML SOLN injection Inject 120 mg into the skin once.  . diclofenac sodium (VOLTAREN) 1 % GEL Apply 1 application topically daily as needed (apply to hands). Hand pain  . furosemide (LASIX) 40 MG tablet Take 40 mg by mouth 2 (two) times daily.  Marland Kitchen ipratropium (ATROVENT HFA) 17 MCG/ACT inhaler Inhale 2 puffs into the lungs 3 (three) times daily.  Marland Kitchen KLOR-CON M20 20 MEQ tablet TAKE 1 TABLET EVERY DAY  . lactulose, encephalopathy, (CHRONULAC) 10 GM/15ML SOLN Take 20 g by mouth 3 (three) times daily as needed (Constipation).   . montelukast (SINGULAIR) 10 MG tablet Take 10 mg by mouth at bedtime.  . Multiple Vitamins-Minerals (MULTIVITAMIN WITH MINERALS) tablet Take 1 tablet by mouth every morning.   . nortriptyline (PAMELOR) 25 MG capsule TAKE 2 CAPSULES BY MOUTH AT BEDTIME  . Omega 3 1200 MG CAPS Take 1,200 mg by mouth every morning.   . ondansetron (ZOFRAN) 8 MG tablet Take 1 tablet (8 mg total) by mouth 2 (two) times daily as needed for nausea or vomiting.  . OXYGEN-HELIUM IN Inhale 3 L into the lungs as needed.  . pantoprazole (PROTONIX) 40 MG tablet Take 40 mg by mouth every morning.  . Pitavastatin Calcium (LIVALO) 2 MG TABS Take 1 tablet (2 mg total) by mouth daily.  . SYMBICORT 160-4.5 MCG/ACT inhaler INHALE 2 PUFFS INTO THE LUNGS 2 (TWO) TIMES DAILY.  . VENTOLIN HFA 108 (90 BASE)  MCG/ACT inhaler INHALE 2 PUFFS EVERY 4 HOURS AS NEEDED  . [DISCONTINUED] furosemide (LASIX) 40 MG tablet TAKE 1 TABLET TWICE A DAY (Patient not taking: Reported on 01/13/2015)  . [DISCONTINUED] nortriptyline (PAMELOR) 25 MG capsule TAKE 2 CAPSULES BY MOUTH AT BEDTIME  . [DISCONTINUED] potassium chloride SA (K-DUR,KLOR-CON) 20 MEQ tablet Take 20 mEq by mouth every evening.   . [DISCONTINUED] VENTOLIN HFA 108 (90 BASE) MCG/ACT inhaler USE 2 PUFFS AS NEEDED EVERY 4 HRS AS NEEDED

## 2015-01-27 NOTE — Telephone Encounter (Signed)
I have rec'd pt's insurance verification for [name].  Pt has met her $166 deductible w/Medicare so she will have an estimated responsibility for her 20% co-insurance which is approximately $180.  She also has Medicaid, however Medicaid no longer covers [name] under major medical benefits, it will only cover [name] under the pharmacy benefits. I have sent a copy of the summary of benefits to be scanned into pt's chart.  If [name] cannot afford $180 for her injection, she can call [name] at [phone] to see if she qualifies for one of their financial assistance programs.  If she qualifies, they will instruct her how to proceed. If you have any questions, please let me know. Thank you.

## 2015-01-28 NOTE — Telephone Encounter (Signed)
Left patient a vm with the information below.

## 2015-01-29 ENCOUNTER — Telehealth: Payer: Self-pay | Admitting: Internal Medicine

## 2015-01-29 NOTE — Telephone Encounter (Signed)
Patients medication for ipratropium (ATROVENT HFA) 17 MCG/ACT inhaler [[id number]] is no longer covered and they are requesting diagnosis along with past medications the patient has used that might be covered. Please advise [employer].

## 2015-01-30 NOTE — Telephone Encounter (Signed)
Returned call back to [place], no answer will need to try again at 9am.

## 2015-01-31 NOTE — Telephone Encounter (Signed)
[name] called back regarding the notes below.

## 2015-02-03 NOTE — Telephone Encounter (Signed)
Spoke with [facility] rep, clinical information given and was advised that they will contact us back wwith decision.

## 2015-02-05 ENCOUNTER — Encounter: Payer: Self-pay | Admitting: Internal Medicine

## 2015-02-05 ENCOUNTER — Ambulatory Visit (INDEPENDENT_AMBULATORY_CARE_PROVIDER_SITE_OTHER): Payer: Medicare Other | Admitting: Internal Medicine

## 2015-02-05 VITALS — BP 92/62 | HR 74 | Ht 62.0 in | Wt 127.6 lb

## 2015-02-05 DIAGNOSIS — I10 Essential (primary) hypertension: Secondary | ICD-10-CM

## 2015-02-05 DIAGNOSIS — I350 Nonrheumatic aortic (valve) stenosis: Secondary | ICD-10-CM

## 2015-02-05 DIAGNOSIS — I2510 Atherosclerotic heart disease of native coronary artery without angina pectoris: Secondary | ICD-10-CM

## 2015-02-05 DIAGNOSIS — I5032 Chronic diastolic (congestive) heart failure: Secondary | ICD-10-CM

## 2015-02-05 DIAGNOSIS — Z720 Tobacco use: Secondary | ICD-10-CM | POA: Diagnosis not present

## 2015-02-05 DIAGNOSIS — R0600 Dyspnea, unspecified: Secondary | ICD-10-CM

## 2015-02-05 NOTE — Patient Instructions (Addendum)
Your physician has requested that you have an echocardiogram. Echocardiography is a painless test that uses sound waves to create images of your heart. It provides your doctor with information about the size and shape of your heart and how well your heart's chambers and valves are working. This procedure takes approximately one hour. There are no restrictions for this procedure.   Your physician wants you to follow-up in: 6 months with Dr. Hilty. You will receive a reminder letter in the mail two months in advance. If you don't receive a letter, please call our office to schedule the follow-up appointment.  

## 2015-02-05 NOTE — Progress Notes (Signed)
OFFICE NOTE  Chief Complaint:  Routine follow-up  Primary Care Physician: Scarlette Calico, MD  HPI:  [name] a pleasant 69 year old female with a history of COPD and severe pulmonary hypertension. Recently she was put on home oxygen and has been on an oxygen concentrator, which has made a marked improvement in her ability to get around. She has previously seen Dr. Lamonte Sakai with pulmonology; however, that did not go too well and she has basically given up on any further treatments for COPD other than her oxygen. She still uses nebulizers as needed when she is tight, and I recommended Claritin for seasonal allergies today. As you know, she also has mild aortic stenosis and we are continuing to follow that. She is a low-risk stress test in November 2012. Unfortunately she was recently admitted for altered mental status, possible Tylenol overdose, respiratory failure and liver failure. All of which seems to have resolved. She seems to be doing pre-well after discharge.  She returns today for followup. She reports doing fairly well. She has seen Dr. Lake Bells in the interim and he is recommended continue current therapies. She is now seeing a different internist, Dr. Ronnald Ramp. He was recently admitted the hospital for pneumonia and treated accordingly. She had troponins which were negative suggesting they're likely is no significant underlying obstructive coronary disease. She reports she's recovered and is back to her base. She is overdue for repeat lipid profile.  I saw [name] back in the office today. Overall she reports she is doing fairly well. She's not been admitted to the hospital recently. She says she has stable shortness of breath. She denies any chest pain. It should be noted that she does have mild aortic stenosis which will last assessed in 2014.  PMHx:  Past Medical History  Diagnosis Date  . Angina   . Heart murmur   . Depressed   . Coronary artery disease 2006    2 stents  w/previous MI  . Hypertension   . Migraine   . Anxiety   . Myocardial infarction 2006    NSTEMI  . COPD (chronic obstructive pulmonary disease)     oxygen-dependent 4LPM [place]  . Moderate to severe pulmonary hypertension   . Diastolic CHF   . Cancer of breast dx'd 2008    RT Breast  . Aortic stenosis     mild  . Dyslipidemia   . NSVT (nonsustained ventricular tachycardia)     h/o  . History of nuclear stress test 08/03/2011    attenuation at apex - no perfusion defects   . Abnormal LFTs 08/02/2011  . Acute liver failure 06/19/2013  . Acute renal failure 06/19/2013  . Acute respiratory failure 06/19/2013  . Altered mental status 08/01/2011  . Anxiety state, unspecified 12/03/2013  . Breast cancer of upper-outer quadrant of left female breast 08/21/2013  . CAD (coronary artery disease) of artery bypass graft 11/06/2013  . CAD (coronary artery disease), MI R/O 08/01/2011    PCI in 2006 (bare metal stent, unknown artery) - [place]   . CHF,  acute diastolic, BNP 4k on admissio 08/01/2011  . Chronic respiratory failure 02/02/2012  . Compression fracture of L1 lumbar vertebra 02/02/2012  . Mild aortic stenosis 08/01/2011    AVA 1.69 cm2 (06/02/2011)   . Hyponatremia 01/31/2012  . Hyperkalemia, on ACE prior to admission 11/11/2011  . HTN (hypertension) 11/06/2013  . History of breast cancer in female 06/23/2013  . 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  Past Surgical History  Procedure Laterality Date  . Breast lumpectomy Right 2008  . Cardiac catheterization  2006  . Cholecystectomy  2011  . Abdominal surgery  2009  . Transthoracic echocardiogram  11/12/2011    EF 07-62%, normal systolic function, grade 1 diastolic dysfunction; ventricular septal flattening (D-sign); mild AS; trace-mild MR; LA mildly dilated; RV mod dilated; RA mod dilated; severe pulm HTN; elevated CVP  . Right heart catheterization N/A 09/05/2013    Procedure: RIGHT HEART CATH;  Surgeon: Jolaine Artist, MD;  Location[facility] CATH [facility];   Service: Cardiovascular;  Laterality: N/A;    FAMHx:  Family History  Problem Relation Age of Onset  . Schizophrenia Sister   . Alzheimer's disease Mother   . Hyperlipidemia Brother   . Heart disease Sister   . Diabetes Sister   . Cancer Neg Hx   . Stroke Neg Hx   . COPD Neg Hx   . Depression Neg Hx   . Drug abuse Neg Hx   . Early death Neg Hx   . Hypertension Neg Hx   . Kidney disease Neg Hx     SOCHx:   reports that she has been smoking Cigarettes.  She has a 11.75 pack-year smoking history. She has never used smokeless tobacco. She reports that she does not drink alcohol or use illicit drugs.  ALLERGIES:  Allergies  Allergen Reactions  . Hydroxyzine Shortness Of Breath and Other (See Comments)    Pt states med make her light headed, get sob sxs  . Ceftriaxone Other (See Comments)    "Blow up like a balloon"  . Lexapro [Escitalopram] Other (See Comments)    Pt states med make her dizzy    ROS: A comprehensive review of systems was negative except for: Respiratory: positive for dyspnea on exertion Gastrointestinal: positive for gas  HOME MEDS: Current Outpatient Prescriptions  Medication Sig Dispense Refill  . albuterol (PROVENTIL) (5 MG/ML) 0.5% nebulizer solution Take 2.5 mg by nebulization every 2 (two) hours as needed for wheezing or shortness of breath.    . ALPRAZolam (XANAX) 1 MG tablet TAKE 1 TABLET THREE TIMES A DAY AS NEEDED FOR SLEEP OR ANXIETY 60 tablet 5  . aspirin 325 MG EC tablet Take 325 mg by mouth every morning.     Marland Kitchen aspirin-acetaminophen-caffeine (EXCEDRIN MIGRAINE) 250-250-65 MG per tablet Take 2 tablets by mouth daily as needed for headache.    . ATROVENT HFA 17 MCG/ACT inhaler USE 2 PUFFS EVERY FOUR HOURS AS NEEDED 77.4 Inhaler 3  . butalbital-acetaminophen-caffeine (FIORICET) 50-325-40 MG per tablet Take 1-2 pills as needed for headache 60 tablet 5  . calcium-vitamin D (OSCAL WITH D) 250-125 MG-UNIT per tablet Take 1 tablet by mouth every  morning.     . denosumab (XGEVA) 120 MG/1.7ML SOLN injection Inject 120 mg into the skin once.    . diclofenac sodium (VOLTAREN) 1 % GEL Apply 1 application topically daily as needed (apply to hands). Hand pain    . furosemide (LASIX) 40 MG tablet Take 40 mg by mouth 2 (two) times daily.    Marland Kitchen ipratropium (ATROVENT HFA) 17 MCG/ACT inhaler Inhale 2 puffs into the lungs 3 (three) times daily.    Marland Kitchen KLOR-CON M20 20 MEQ tablet TAKE 1 TABLET EVERY DAY 180 tablet 3  . lactulose, encephalopathy, (CHRONULAC) 10 GM/15ML SOLN Take 20 g by mouth 3 (three) times daily as needed (Constipation).     . montelukast (SINGULAIR) 10 MG tablet Take 10 mg by mouth  at bedtime.    . Multiple Vitamins-Minerals (MULTIVITAMIN WITH MINERALS) tablet Take 1 tablet by mouth every morning.     . nortriptyline (PAMELOR) 25 MG capsule TAKE 2 CAPSULES BY MOUTH AT BEDTIME 60 capsule 2  . Omega 3 1200 MG CAPS Take 1,200 mg by mouth every morning.     . ondansetron (ZOFRAN) 8 MG tablet Take 1 tablet (8 mg total) by mouth 2 (two) times daily as needed for nausea or vomiting. 30 tablet 11  . OXYGEN-HELIUM IN Inhale 3 L into the lungs as needed.    . pantoprazole (PROTONIX) 40 MG tablet Take 40 mg by mouth every morning.    . Pitavastatin Calcium (LIVALO) 2 MG TABS Take 1 tablet (2 mg total) by mouth daily. 30 tablet 10  . SYMBICORT 160-4.5 MCG/ACT inhaler INHALE 2 PUFFS INTO THE LUNGS 2 (TWO) TIMES DAILY. 30 Inhaler 3  . VENTOLIN HFA 108 (90 BASE) MCG/ACT inhaler INHALE 2 PUFFS EVERY 4 HOURS AS NEEDED 108 Inhaler 1   No current facility-administered medications for this visit.    LABS/IMAGING: No results found for this or any previous visit (from the past 48 hour(s)). No results found.  VITALS: BP 92/62 mmHg  Pulse 74  Ht 5\' 2"  (1.575 m)  Wt 127 lb 9.6 oz (57.879 kg)  BMI 23.33 kg/m2  EXAM: General appearance: alert, appears older than stated age, no distress and on oxygen by nasal cannula Neck: no carotid bruit and no  JVD Lungs: diminished breath sounds bilaterally and faint rhonchi Heart: regular rate and rhythm, S1, S2 normal and systolic murmur: early systolic 3/6, crescendo at 2nd right intercostal space Abdomen: soft, non-tender; bowel sounds normal; no masses,  no organomegaly Extremities: extremities normal, atraumatic, no cyanosis or edema Pulses: 2+ and symmetric Skin: Skin color, texture, turgor normal. No rashes or lesions Neurologic: Grossly normal Psych: At baseline mood, affect  EKG: Normal sinus rhythm with PVCs at 74  ASSESSMENT: 1. Severe COPD with severe pulmonary hypertension, on chronic oxygen with recent exacerbation 2. Mild aortic stenosis 3. Low risk nuclear stress testing in 2012 4. Dyslipidemia - on livalo  5. History of CAD with prior remote PCI  PLAN: 1.   [name] is doing well with her pulmonary disease which is stable. She does have mild aortic stenosis and her last echo was in 2014. I would like to recheck that today as her murmur is slightly louder. She has dyslipidemia and seems to be tolerating Livalo. Liver enzymes do not seem to be affected by this and her cholesterol is well controlled, with recent LDL-P of just over 1000. She's had no chest pain or active signs of coronary disease. Her last stress test was in 2012.  Plan to see her back in 6 months or sooner as necessary.  Pixie Casino, MD, [facility] Attending Cardiologist [place] 02/05/2015, 2:33 PM

## 2015-02-07 NOTE — Telephone Encounter (Signed)
Atrovent denied per PA, pt must trry and fail Spiriva. Thanks

## 2015-02-09 NOTE — Telephone Encounter (Signed)
Please refer this to her lung doctor

## 2015-02-10 MED ORDER — IPRATROPIUM-ALBUTEROL 0.5-2.5 (3) MG/3ML IN SOLN: 3.0000 mL | Freq: Two times a day (BID) | RESPIRATORY_TRACT | Status: DC | PRN

## 2015-02-10 NOTE — Telephone Encounter (Signed)
Pt aware of rec's per BQ rx [facility] to [facility] for patient to use PRN.  Nothing further needed.

## 2015-02-10 NOTE — Addendum Note (Signed)
Addended by: Virl Cagey on: 02/10/2015 02:09 PM   Modules accepted: Orders

## 2015-02-10 NOTE — Telephone Encounter (Signed)
I don't think she needs Spiriva because she does well with symbicort Would rec duoneb bid Rx and tell her to use it only as needed

## 2015-02-10 NOTE — Telephone Encounter (Signed)
Please advise Dr Lake Bells if you wish to switch pt to Spiriva. Per notes below, PA done for Atrovent and was denied as a final decision.  Thanks.

## 2015-02-11 ENCOUNTER — Ambulatory Visit (HOSPITAL_COMMUNITY)
Admission: RE | Admit: 2015-02-11 | Discharge: 2015-02-11 | Disposition: A | Discharge status: Home / Self Care | Payer: Medicare Other | Source: Ambulatory Visit | Attending: Cardiovascular Disease | Admitting: Cardiovascular Disease

## 2015-02-11 DIAGNOSIS — I2510 Atherosclerotic heart disease of native coronary artery without angina pectoris: Secondary | ICD-10-CM | POA: Insufficient documentation

## 2015-02-11 DIAGNOSIS — R0600 Dyspnea, unspecified: Secondary | ICD-10-CM

## 2015-02-11 DIAGNOSIS — I350 Nonrheumatic aortic (valve) stenosis: Secondary | ICD-10-CM

## 2015-02-11 DIAGNOSIS — J449 Chronic obstructive pulmonary disease, unspecified: Secondary | ICD-10-CM | POA: Insufficient documentation

## 2015-02-11 DIAGNOSIS — Z853 Personal history of malignant neoplasm of breast: Secondary | ICD-10-CM | POA: Insufficient documentation

## 2015-02-11 DIAGNOSIS — E785 Hyperlipidemia, unspecified: Secondary | ICD-10-CM | POA: Diagnosis not present

## 2015-02-11 DIAGNOSIS — I509 Heart failure, unspecified: Secondary | ICD-10-CM | POA: Insufficient documentation

## 2015-02-12 ENCOUNTER — Telehealth: Payer: Self-pay | Admitting: Internal Medicine

## 2015-02-12 NOTE — Telephone Encounter (Signed)
Returning call,concerning echo results.

## 2015-02-12 NOTE — Telephone Encounter (Signed)
ECHO RESULTS GIVEN TO PATIENT SHE VERBALIZED UNDERSTANDING.

## 2015-02-21 ENCOUNTER — Telehealth: Payer: Self-pay | Admitting: Pulmonary Disease

## 2015-02-21 NOTE — Telephone Encounter (Signed)
lmtcb X2 for pt.  

## 2015-02-21 NOTE — Telephone Encounter (Signed)
LMTCB x 1 

## 2015-02-21 NOTE — Telephone Encounter (Signed)
[phone] pt calling back

## 2015-02-25 NOTE — Telephone Encounter (Signed)
Pt returning call and says that its ok to leave answer on vm.[name]

## 2015-02-25 NOTE — Telephone Encounter (Signed)
lmtcb X3 for pt. 

## 2015-02-25 NOTE — Telephone Encounter (Signed)
Called and spoke to pt. Pt stated the atrovent hfa needs a PA (PA went to Dr. Ronnald Ramp office). Pt is on duoneb, albuterol hfa, albuterol neb, and states she has been using atrovent hfa in combination with albuterol hfa for rescue.   BQ please advise if you want to proceed with PA for atrovent.

## 2015-02-26 ENCOUNTER — Telehealth: Payer: Self-pay

## 2015-02-26 ENCOUNTER — Telehealth: Payer: Self-pay | Admitting: Pulmonary Disease

## 2015-02-26 DIAGNOSIS — F411 Generalized anxiety disorder: Secondary | ICD-10-CM

## 2015-02-26 MED ORDER — IPRATROPIUM-ALBUTEROL 20-100 MCG/ACT IN AERS: 1.0000 | INHALATION_SPRAY | Freq: Four times a day (QID) | RESPIRATORY_TRACT | Status: DC

## 2015-02-26 MED ORDER — ALPRAZOLAM 1 MG PO TABS: ORAL_TABLET | ORAL | Status: DC

## 2015-02-26 NOTE — Telephone Encounter (Signed)
Combivent respimat would be a better option, please have her check to see if it is on formulary as this would replaced atravent and albuterol

## 2015-02-26 NOTE — Telephone Encounter (Signed)
[facility]  request refills for alprazoplam 1 mg . Rx last 10/11/14 written and next scheduled appt 04/11/15   . Please advise Thanks

## 2015-02-26 NOTE — Telephone Encounter (Signed)
Called spoke with patient who is okay with going ahead and starting the Combivent Respimat to replace the Atrovent and Albuterol  Verified Combivent instructions with BQ >> 1 puff every 6 hours Pt had requested to have the rx sent to her verified pharmacy to have them run the rx to see if it's covered by her insurance (she feels the Combivent will not be an issue as this has been covered under her insurance in the past) - this has been done Will go ahead and sign off on message as nothing further is needed at this point  Atrovent and Albuterol removed from med list Combivent Respimat sent to pharmacy

## 2015-02-26 NOTE — Telephone Encounter (Signed)
Called spoke with pt. She reports she was able to combivent through the pharmacy. She wanted to make sure ashley and Dr. Lake Bells is aware of this.

## 2015-02-26 NOTE — Telephone Encounter (Signed)
done

## 2015-03-06 ENCOUNTER — Other Ambulatory Visit: Payer: Self-pay | Admitting: Emergency Medicine

## 2015-03-06 MED ORDER — MONTELUKAST SODIUM 10 MG PO TABS: 10.0000 mg | ORAL_TABLET | Freq: Every day | ORAL | Status: DC

## 2015-03-27 ENCOUNTER — Telehealth: Payer: Self-pay | Admitting: Internal Medicine

## 2015-03-27 NOTE — Telephone Encounter (Signed)
Yes that is ok

## 2015-03-27 NOTE — Telephone Encounter (Signed)
Per medication history, scripts has always been written for three times daily 90 tablets. Please advise

## 2015-03-27 NOTE — Telephone Encounter (Signed)
Pharmacy called and are wanting to confirm directions for  prescription ALPRAZolam (XANAX) 1 MG tablet [[id number]. The instructions say 1 tablet 3 times a day with a quantity of 60. They filled last month at 2 times a day and are wanting to be sure that is ok.

## 2015-04-03 ENCOUNTER — Other Ambulatory Visit: Payer: Self-pay

## 2015-04-03 MED ORDER — PANTOPRAZOLE SODIUM 40 MG PO TBEC: 40.0000 mg | DELAYED_RELEASE_TABLET | Freq: Every morning | ORAL | Status: DC

## 2015-04-03 MED ORDER — FUROSEMIDE 40 MG PO TABS: 40.0000 mg | ORAL_TABLET | Freq: Two times a day (BID) | ORAL | Status: DC

## 2015-04-03 NOTE — Telephone Encounter (Signed)
I have a script request from [facility] Furosemide 40 MG tab #180 /1 RF SIG: Take one Tab BID.  Can I go ahead and fill?

## 2015-04-03 NOTE — Telephone Encounter (Signed)
Patient need her prolia

## 2015-04-10 ENCOUNTER — Ambulatory Visit: Payer: Medicare Other | Admitting: Internal Medicine

## 2015-04-11 ENCOUNTER — Ambulatory Visit: Payer: Medicare Other | Admitting: Internal Medicine

## 2015-04-14 ENCOUNTER — Other Ambulatory Visit (INDEPENDENT_AMBULATORY_CARE_PROVIDER_SITE_OTHER): Payer: Medicare Other

## 2015-04-14 ENCOUNTER — Ambulatory Visit (INDEPENDENT_AMBULATORY_CARE_PROVIDER_SITE_OTHER): Payer: Medicare Other | Admitting: Internal Medicine

## 2015-04-14 ENCOUNTER — Encounter: Payer: Self-pay | Admitting: Internal Medicine

## 2015-04-14 VITALS — BP 112/76 | HR 80 | Temp 97.9°F | Resp 20 | Wt 125.0 lb

## 2015-04-14 DIAGNOSIS — M810 Age-related osteoporosis without current pathological fracture: Secondary | ICD-10-CM

## 2015-04-14 DIAGNOSIS — I2510 Atherosclerotic heart disease of native coronary artery without angina pectoris: Secondary | ICD-10-CM

## 2015-04-14 DIAGNOSIS — M8580 Other specified disorders of bone density and structure, unspecified site: Secondary | ICD-10-CM | POA: Diagnosis not present

## 2015-04-14 DIAGNOSIS — D510 Vitamin B12 deficiency anemia due to intrinsic factor deficiency: Secondary | ICD-10-CM | POA: Diagnosis not present

## 2015-04-14 DIAGNOSIS — I10 Essential (primary) hypertension: Secondary | ICD-10-CM | POA: Diagnosis not present

## 2015-04-14 LAB — CBC WITH DIFFERENTIAL/PLATELET
Basophils Absolute: 0 10*3/uL (ref 0.0–0.1)
Basophils Relative: 0.2 % (ref 0.0–3.0)
Eosinophils Absolute: 0.1 10*3/uL (ref 0.0–0.7)
Eosinophils Relative: 1.3 % (ref 0.0–5.0)
HCT: 39.3 % (ref 36.0–46.0)
Hemoglobin: 13 g/dL (ref 12.0–15.0)
Lymphocytes Relative: 15.3 % (ref 12.0–46.0)
Lymphs Abs: 1.3 10*3/uL (ref 0.7–4.0)
MCHC: 33.1 g/dL (ref 30.0–36.0)
MCV: 95.1 fl (ref 78.0–100.0)
Monocytes Absolute: 0.4 10*3/uL (ref 0.1–1.0)
Monocytes Relative: 5.4 % (ref 3.0–12.0)
Neutro Abs: 6.4 10*3/uL (ref 1.4–7.7)
Neutrophils Relative %: 77.8 % — ABNORMAL HIGH (ref 43.0–77.0)
Platelets: 309 10*3/uL (ref 150.0–400.0)
RBC: 4.13 Mil/uL (ref 3.87–5.11)
RDW: 14.1 % (ref 11.5–15.5)
WBC: 8.2 10*3/uL (ref 4.0–10.5)

## 2015-04-14 LAB — VITAMIN B12: Vitamin B-12: 386 pg/mL (ref 211–911)

## 2015-04-14 LAB — FERRITIN: Ferritin: 32.2 ng/mL (ref 10.0–291.0)

## 2015-04-14 LAB — IBC PANEL
Iron: 54 ug/dL (ref 42–145)
Saturation Ratios: 15.1 % — ABNORMAL LOW (ref 20.0–50.0)
Transferrin: 255 mg/dL (ref 212.0–360.0)

## 2015-04-14 LAB — BASIC METABOLIC PANEL
BUN: 12 mg/dL (ref 6–23)
CO2: 35 mEq/L — ABNORMAL HIGH (ref 19–32)
Calcium: 10.3 mg/dL (ref 8.4–10.5)
Chloride: 99 mEq/L (ref 96–112)
Creatinine, Ser: 0.89 mg/dL (ref 0.40–1.20)
GFR: 66.78 mL/min (ref >60.00)
Glucose, Bld: 71 mg/dL (ref 70–99)
Potassium: 4.2 mEq/L (ref 3.5–5.1)
Sodium: 140 mEq/L (ref 135–145)

## 2015-04-14 LAB — FOLATE: Folate: >24.8 ng/mL (ref >5.9)

## 2015-04-14 MED ORDER — DENOSUMAB 60 MG/ML ~~LOC~~ SOLN
60.0000 mg | Freq: Once | SUBCUTANEOUS | Status: AC
  Administered 2015-04-14: 60 mg via SUBCUTANEOUS

## 2015-04-14 NOTE — Progress Notes (Signed)
Pre visit review using our clinic review tool, if applicable. No additional management support is needed unless otherwise documented below in the visit note. 

## 2015-04-14 NOTE — Progress Notes (Signed)
Subjective:  Patient ID: [name], female    DOB: 05/23/1946  Age: 69 y.o. MRN: [id number]  CC: Hypertension and Anemia   HPI [name] presents for follow-up on high blood pressure and monitoring anemia. She feels well today and offers no new complaints.  Outpatient Prescriptions Prior to Visit  Medication Sig Dispense Refill  . ALPRAZolam (XANAX) 1 MG tablet TAKE 1 TABLET THREE TIMES A DAY AS NEEDED FOR SLEEP OR ANXIETY 60 tablet 5  . aspirin 325 MG EC tablet Take 325 mg by mouth every morning.     Marland Kitchen aspirin-acetaminophen-caffeine (EXCEDRIN MIGRAINE) 250-250-65 MG per tablet Take 2 tablets by mouth daily as needed for headache.    . butalbital-acetaminophen-caffeine (FIORICET) 50-325-40 MG per tablet Take 1-2 pills as needed for headache 60 tablet 5  . calcium-vitamin D (OSCAL WITH D) 250-125 MG-UNIT per tablet Take 1 tablet by mouth every morning.     . diclofenac sodium (VOLTAREN) 1 % GEL Apply 1 application topically daily as needed (apply to hands). Hand pain    . furosemide (LASIX) 40 MG tablet Take 1 tablet (40 mg total) by mouth 2 (two) times daily. 60 tablet 3  . Ipratropium-Albuterol (COMBIVENT RESPIMAT) 20-100 MCG/ACT AERS respimat Inhale 1 puff into the lungs every 6 (six) hours. 4 g 5  . ipratropium-albuterol (DUONEB) 0.5-2.5 (3) MG/3ML SOLN Take 3 mLs by nebulization 2 (two) times daily as needed. 180 mL 2  . KLOR-CON M20 20 MEQ tablet TAKE 1 TABLET EVERY DAY 180 tablet 3  . lactulose, encephalopathy, (CHRONULAC) 10 GM/15ML SOLN Take 20 g by mouth 3 (three) times daily as needed (Constipation).     . montelukast (SINGULAIR) 10 MG tablet Take 1 tablet (10 mg total) by mouth at bedtime. 30 tablet 5  . Multiple Vitamins-Minerals (MULTIVITAMIN WITH MINERALS) tablet Take 1 tablet by mouth every morning.     . nortriptyline (PAMELOR) 25 MG capsule TAKE 2 CAPSULES BY MOUTH AT BEDTIME 60 capsule 2  . Omega 3 1200 MG CAPS Take 1,200 mg by mouth every morning.     .  ondansetron (ZOFRAN) 8 MG tablet Take 1 tablet (8 mg total) by mouth 2 (two) times daily as needed for nausea or vomiting. 30 tablet 11  . OXYGEN-HELIUM IN Inhale 3 L into the lungs as needed.    . pantoprazole (PROTONIX) 40 MG tablet Take 1 tablet (40 mg total) by mouth every morning. 90 tablet 1  . Pitavastatin Calcium (LIVALO) 2 MG TABS Take 1 tablet (2 mg total) by mouth daily. 30 tablet 10  . SYMBICORT 160-4.5 MCG/ACT inhaler INHALE 2 PUFFS INTO THE LUNGS 2 (TWO) TIMES DAILY. 30 Inhaler 3  . denosumab (XGEVA) 120 MG/1.7ML SOLN injection Inject 120 mg into the skin once.     No facility-administered medications prior to visit.    ROS Review of Systems  Constitutional: Negative.  Negative for fever, chills, diaphoresis, activity change, appetite change, fatigue and unexpected weight change.  HENT: Negative.  Negative for trouble swallowing and voice change.   Eyes: Negative.   Respiratory: Positive for shortness of breath (chronic, unchanged). Negative for apnea, cough, choking, chest tightness, wheezing and stridor.   Cardiovascular: Negative.  Negative for chest pain, palpitations and leg swelling.  Gastrointestinal: Negative.  Negative for nausea, vomiting, abdominal pain, diarrhea and constipation.  Endocrine: Negative.   Genitourinary: Negative.   Musculoskeletal: Negative.   Allergic/Immunologic: Negative.   Neurological: Negative.  Negative for dizziness, tremors, facial asymmetry, weakness, light-headedness  and numbness.  Hematological: Negative.  Negative for adenopathy. Does not bruise/bleed easily.  Psychiatric/Behavioral: Negative.     Objective:  BP 112/76 mmHg  Pulse 80  Temp(Src) 97.9 F (36.6 C) (Oral)  Resp 20  Wt 125 lb (56.7 kg)  SpO2 91%  BP Readings from Last 3 Encounters:  04/14/15 112/76  02/05/15 92/62  01/13/15 118/64    Wt Readings from Last 3 Encounters:  04/14/15 125 lb (56.7 kg)  02/05/15 127 lb 9.6 oz (57.879 kg)  01/13/15 131 lb (59.421  kg)    Physical Exam  Constitutional: She is oriented to person, place, and time.  Non-toxic appearance. She does not have a sickly appearance. She does not appear ill. No distress.  HENT:  Mouth/Throat: Oropharynx is clear and moist. No oropharyngeal exudate.  Eyes: Conjunctivae are normal. Right eye exhibits no discharge. Left eye exhibits no discharge. No scleral icterus.  Neck: Normal range of motion. Neck supple. No JVD present. No tracheal deviation present. No thyromegaly present.  Cardiovascular: Normal rate, regular rhythm, normal heart sounds and intact distal pulses.  Exam reveals no gallop and no friction rub.   No murmur heard. Pulmonary/Chest: Effort normal. No accessory muscle usage or stridor. No tachypnea. No respiratory distress. She has no decreased breath sounds. She has wheezes in the right middle field and the left middle field. She has rhonchi in the right middle field. She has no rales. She exhibits no tenderness.  Abdominal: Soft. Bowel sounds are normal. She exhibits no distension and no mass. There is no tenderness. There is no rebound and no guarding.  Musculoskeletal: Normal range of motion. She exhibits no edema or tenderness.  Lymphadenopathy:    She has no cervical adenopathy.  Neurological: She is oriented to person, place, and time.  Skin: Skin is warm and dry. No rash noted. She is not diaphoretic. No erythema. No pallor.  Psychiatric: She has a normal mood and affect. Her behavior is normal. Judgment and thought content normal.    Lab Results  Component Value Date   WBC 8.2 04/14/2015   HGB 13.0 04/14/2015   HCT 39.3 04/14/2015   PLT 309.0 04/14/2015   GLUCOSE 71 04/14/2015   CHOL 205* 10/11/2014   TRIG 96 10/11/2014   HDL 77 10/11/2014   LDLCALC 109* 10/11/2014   ALT 18 06/17/2014   AST 22 06/17/2014   NA 140 04/14/2015   K 4.2 04/14/2015   CL 99 04/14/2015   CREATININE 0.89 04/14/2015   BUN 12 04/14/2015   CO2 35* 04/14/2015   TSH 1.22  12/19/2013   INR 0.87 09/05/2013   HGBA1C 5.6 10/11/2014    No results found.  Assessment & Plan:   [name] was seen today for hypertension and anemia.  Diagnoses and all orders for this visit:  Essential hypertension- blood pressure is well-controlled, lites and renal function are stable. Orders: -     Basic metabolic panel; Future  Pernicious anemia- the anemia has resolved and her vitamin levels are normal, will follow for now. Orders: -     CBC with Differential/Platelet; Future -     Folate; Future -     Ferritin; Future -     Vitamin B12; Future -     IBC panel; Future  Osteopenia Orders: -     denosumab ([name]) injection 60 mg; Inject 60 mg into the skin once.  Osteoporosis Orders: -     DG Bone Density; Future  Senile osteoporosis- last DEXA scan was 3  years ago, it is time for her to have an updated scan. Orders: -     DG Bone Density; Future   I have discontinued Ms. Mceachin's denosumab. I am also having her maintain her diclofenac sodium, multivitamin with minerals, calcium-vitamin D, OXYGEN-HELIUM IN, aspirin, Omega 3, ondansetron, lactulose (encephalopathy), aspirin-acetaminophen-caffeine, butalbital-acetaminophen-caffeine, Pitavastatin Calcium, KLOR-CON M20, SYMBICORT, nortriptyline, ipratropium-albuterol, Ipratropium-Albuterol, ALPRAZolam, montelukast, pantoprazole, and furosemide. We administered denosumab.  Meds ordered this encounter  Medications  . denosumab ([name]) injection 60 mg    Sig:      Follow-up: Return in about 4 months (around 08/15/2015).  Scarlette Calico, MD

## 2015-04-14 NOTE — Patient Instructions (Signed)

## 2015-04-15 ENCOUNTER — Encounter: Payer: Self-pay | Admitting: Internal Medicine

## 2015-04-28 ENCOUNTER — Other Ambulatory Visit: Payer: Self-pay

## 2015-04-28 ENCOUNTER — Other Ambulatory Visit: Payer: Self-pay | Admitting: Neurology

## 2015-04-28 DIAGNOSIS — G43709 Chronic migraine without aura, not intractable, without status migrainosus: Secondary | ICD-10-CM

## 2015-04-28 MED ORDER — BUTALBITAL-APAP-CAFFEINE 50-325-40 MG PO TABS: ORAL_TABLET | ORAL | Status: DC

## 2015-04-28 NOTE — Telephone Encounter (Signed)
Pt last seen 04/14/15

## 2015-05-07 ENCOUNTER — Other Ambulatory Visit: Payer: Self-pay

## 2015-05-07 MED ORDER — IPRATROPIUM-ALBUTEROL 0.5-2.5 (3) MG/3ML IN SOLN: 3.0000 mL | Freq: Two times a day (BID) | RESPIRATORY_TRACT | Status: DC | PRN

## 2015-05-09 ENCOUNTER — Telehealth: Payer: Self-pay | Admitting: Pulmonary Disease

## 2015-05-09 MED ORDER — AZITHROMYCIN 250 MG PO TABS: ORAL_TABLET | ORAL | Status: AC

## 2015-05-09 NOTE — Telephone Encounter (Signed)
Spoke with pt- c/o head cold which she feels is turning into URI.  Pt states that her mucus is turning green and she has been having increased cough, sneezing, nausea, HA, chest congestion x 3 days.  Denies trying anything OTC (Mucinex) - requesting antibiotic.  Pt is unable to come in for a visit.  Allergies  Allergen Reactions  . Hydroxyzine Shortness Of Breath and Other (See Comments)    Pt states med make her light headed, get sob sxs  . Ceftriaxone Other (See Comments)    "Blow up like a balloon"  . Lexapro [Escitalopram] Other (See Comments)    Pt states med make her dizzy   Please advise Dr Melvyn Novas. Thanks.

## 2015-05-09 NOTE — Telephone Encounter (Signed)
Spoke with pt. She is aware of MW's recommendation. Rx has been sent in. Nothing further was needed. 

## 2015-05-09 NOTE — Telephone Encounter (Signed)
[id number] 

## 2015-06-03 ENCOUNTER — Inpatient Hospital Stay: Admission: RE | Admit: 2015-06-03 | Payer: Medicare Other | Source: Ambulatory Visit

## 2015-06-26 ENCOUNTER — Other Ambulatory Visit: Payer: Self-pay | Admitting: Internal Medicine

## 2015-06-26 NOTE — Telephone Encounter (Signed)
REFILL 

## 2015-06-26 NOTE — Telephone Encounter (Signed)
livalo already refilled

## 2015-06-27 ENCOUNTER — Other Ambulatory Visit: Payer: Self-pay

## 2015-06-27 MED ORDER — LACTULOSE ENCEPHALOPATHY 10 GM/15ML PO SOLN: 20.0000 g | Freq: Three times a day (TID) | ORAL | Status: DC | PRN

## 2015-07-07 ENCOUNTER — Telehealth: Payer: Self-pay | Admitting: Internal Medicine

## 2015-07-07 NOTE — Telephone Encounter (Signed)
Re-faxed forms to [name]. Patient states she may be having issues with her fax.   We made a plan to re-fax the form on Wednesday if needed. Also let patient know that I would leave a copy of the forms at the front desk, just in case.

## 2015-07-07 NOTE — Telephone Encounter (Signed)
Patient states she got incomplete information sent back. Sent by [employer].  Will route.  Best number to reach her is [phone] (I called other number listed for home and it is an invalid number).

## 2015-07-07 NOTE — Telephone Encounter (Signed)
Pt called in stating that she did not receive all the paperwork she needed for her [employer] forms, she says that she just received 3 out of 7 pages. Please f/u with her  Thanks

## 2015-07-25 ENCOUNTER — Other Ambulatory Visit: Payer: Self-pay

## 2015-07-25 ENCOUNTER — Other Ambulatory Visit: Payer: Self-pay | Admitting: Internal Medicine

## 2015-07-25 ENCOUNTER — Other Ambulatory Visit: Payer: Self-pay | Admitting: Neurology

## 2015-07-25 MED ORDER — FUROSEMIDE 40 MG PO TABS: 40.0000 mg | ORAL_TABLET | Freq: Two times a day (BID) | ORAL | Status: DC

## 2015-07-25 NOTE — Telephone Encounter (Signed)
Pt request refill for nortriptyline (PAMELOR) 25 MG to send to [facility]. Please advise.

## 2015-07-25 NOTE — Telephone Encounter (Signed)
Incoming fax and call for pended med. Ok to fil?

## 2015-07-26 MED ORDER — NORTRIPTYLINE HCL 25 MG PO CAPS: 50.0000 mg | ORAL_CAPSULE | Freq: Every day | ORAL | Status: DC

## 2015-08-04 ENCOUNTER — Other Ambulatory Visit: Payer: Self-pay

## 2015-08-04 MED ORDER — DICLOFENAC SODIUM 1 % TD GEL: 4.0000 g | Freq: Every day | TRANSDERMAL | Status: DC | PRN

## 2015-08-12 ENCOUNTER — Encounter: Payer: Self-pay | Admitting: Internal Medicine

## 2015-08-12 ENCOUNTER — Ambulatory Visit (INDEPENDENT_AMBULATORY_CARE_PROVIDER_SITE_OTHER): Payer: Medicare Other | Admitting: Internal Medicine

## 2015-08-12 VITALS — BP 98/66 | HR 84 | Ht 62.0 in | Wt 121.7 lb

## 2015-08-12 DIAGNOSIS — I10 Essential (primary) hypertension: Secondary | ICD-10-CM

## 2015-08-12 DIAGNOSIS — Z720 Tobacco use: Secondary | ICD-10-CM

## 2015-08-12 DIAGNOSIS — I5032 Chronic diastolic (congestive) heart failure: Secondary | ICD-10-CM | POA: Diagnosis not present

## 2015-08-12 DIAGNOSIS — E785 Hyperlipidemia, unspecified: Secondary | ICD-10-CM

## 2015-08-12 DIAGNOSIS — J438 Other emphysema: Secondary | ICD-10-CM

## 2015-08-12 DIAGNOSIS — I350 Nonrheumatic aortic (valve) stenosis: Secondary | ICD-10-CM | POA: Diagnosis not present

## 2015-08-12 DIAGNOSIS — I2510 Atherosclerotic heart disease of native coronary artery without angina pectoris: Secondary | ICD-10-CM

## 2015-08-12 MED ORDER — PITAVASTATIN CALCIUM 2 MG PO TABS: 2.0000 mg | ORAL_TABLET | Freq: Every day | ORAL | Status: DC

## 2015-08-12 NOTE — Progress Notes (Signed)
OFFICE NOTE  Chief Complaint:  No complaints  Primary Care Physician: Scarlette Calico, MD  HPI:  [name] a pleasant 69 year old female with a history of COPD and severe pulmonary hypertension. Recently she was put on home oxygen and has been on an oxygen concentrator, which has made a marked improvement in her ability to get around. She has previously seen Dr. Lamonte Sakai with pulmonology; however, that did not go too well and she has basically given up on any further treatments for COPD other than her oxygen. She still uses nebulizers as needed when she is tight, and I recommended Claritin for seasonal allergies today. As you know, she also has mild aortic stenosis and we are continuing to follow that. She is a low-risk stress test in November 2012. Unfortunately she was recently admitted for altered mental status, possible Tylenol overdose, respiratory failure and liver failure. All of which seems to have resolved. She seems to be doing pre-well after discharge.  She returns today for followup. She reports doing fairly well. She has seen Dr. Lake Bells in the interim and he is recommended continue current therapies. She is now seeing a different internist, Dr. Ronnald Ramp. He was recently admitted the hospital for pneumonia and treated accordingly. She had troponins which were negative suggesting they're likely is no significant underlying obstructive coronary disease. She reports she's recovered and is back to her base. She is overdue for repeat lipid profile.  I saw [name] back in the office today. Overall she reports she is doing fairly well. She's not been admitted to the hospital recently. She says she has stable shortness of breath. She denies any chest pain. It should be noted that she does have mild aortic stenosis which will last assessed in 2014.  [name] returns today for follow-up. She reports that she is doing fairly well. Fortune she's not been hospitalized. She has had an  episode of COPD exacerbation which was supported with early antibiotics. She denies any worsening chest pain and has pretty stable shortness of breath on oxygen. She is able to ambulate and do most of her activities without any restrictions.  PMHx:  Past Medical History  Diagnosis Date  . Angina   . Heart murmur   . Depressed   . Coronary artery disease 2006    2 stents w/previous MI  . Hypertension   . Migraine   . Anxiety   . Myocardial infarction [facility] 2006    NSTEMI  . COPD (chronic obstructive pulmonary disease)     oxygen-dependent 4LPM [place]  . Moderate to severe pulmonary hypertension   . Diastolic CHF   . Cancer of breast dx'd 2008    RT Breast  . Aortic stenosis     mild  . Dyslipidemia   . NSVT (nonsustained ventricular tachycardia)     h/o  . History of nuclear stress test 08/03/2011    attenuation at apex - no perfusion defects   . Abnormal LFTs 08/02/2011  . Acute liver failure 06/19/2013  . Acute renal failure 06/19/2013  . Acute respiratory failure 06/19/2013  . Altered mental status 08/01/2011  . Anxiety state, unspecified 12/03/2013  . Breast cancer of upper-outer quadrant of left female breast 08/21/2013  . CAD (coronary artery disease) of artery bypass graft 11/06/2013  . CAD (coronary artery disease), MI R/O 08/01/2011    PCI in 2006 (bare metal stent, unknown artery) - [place]   . CHF,  acute diastolic, BNP 4k on admissio 08/01/2011  .  Chronic respiratory failure 02/02/2012  . Compression fracture of L1 lumbar vertebra 02/02/2012  . Mild aortic stenosis 08/01/2011    AVA 1.69 cm2 (06/02/2011)   . Hyponatremia 01/31/2012  . Hyperkalemia, on ACE prior to admission 11/11/2011  . HTN (hypertension) 11/06/2013  . History of breast cancer in female 06/23/2013  . 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 Breast lumpectomy Right 2008  . Cardiac catheterization  2006  . Cholecystectomy  2011  . Abdominal surgery  2009  .  Transthoracic echocardiogram  11/12/2011    EF 0000000, normal systolic function, grade 1 diastolic dysfunction; ventricular septal flattening (D-sign); mild AS; trace-mild MR; LA mildly dilated; RV mod dilated; RA mod dilated; severe pulm HTN; elevated CVP  . Right heart catheterization N/A 09/05/2013    Procedure: RIGHT HEART CATH;  Surgeon: Jolaine Artist, MD;  [facility] C[facility] LAB;  Service: Cardiovascular;  Laterality: N/A;    FAMHx:  Family History  Problem Relation Age of Onset  . Schizophrenia Sister   . Alzheimer's disease Mother   . Hyperlipidemia Brother   . Heart disease Sister   . Diabetes Sister   . Cancer Neg Hx   . Stroke Neg Hx   . COPD Neg Hx   . Depression Neg Hx   . Drug abuse Neg Hx   . Early death Neg Hx   . Hypertension Neg Hx   . Kidney disease Neg Hx     SOCHx:   reports that she has been smoking Cigarettes.  She has a 11.75 pack-year smoking history. She has never used smokeless tobacco. She reports that she does not drink alcohol or use illicit drugs.  ALLERGIES:  Allergies  Allergen Reactions  . Hydroxyzine Shortness Of Breath and Other (See Comments)    Pt states med make her light headed, get sob sxs  . Ceftriaxone Other (See Comments)    *ROCEPHIN*  "Blow up like a balloon"  . Lexapro [Escitalopram] Other (See Comments)    Pt states med make her dizzy    ROS: A comprehensive review of systems was negative except for: Respiratory: positive for dyspnea on exertion Gastrointestinal: positive for gas  HOME MEDS: Current Outpatient Prescriptions  Medication Sig Dispense Refill  . ALPRAZolam (XANAX) 1 MG tablet TAKE 1 TABLET THREE TIMES A DAY AS NEEDED FOR SLEEP OR ANXIETY 60 tablet 5  . aspirin 325 MG EC tablet Take 325 mg by mouth every morning.     Marland Kitchen aspirin-acetaminophen-caffeine (EXCEDRIN MIGRAINE) 250-250-65 MG per tablet Take 2 tablets by mouth daily as needed for headache.    . butalbital-acetaminophen-caffeine (FIORICET) 50-325-40  MG per tablet Take 1-2 pills as needed for headache 60 tablet 5  . calcium-vitamin D (OSCAL WITH D) 250-125 MG-UNIT per tablet Take 1 tablet by mouth every morning.     . diclofenac sodium (VOLTAREN) 1 % GEL Apply 4 g topically daily as needed (apply to hands). Hand pain 100 g 2  . furosemide (LASIX) 40 MG tablet Take 1 tablet (40 mg total) by mouth 2 (two) times daily. 180 tablet 0  . Ipratropium-Albuterol (COMBIVENT RESPIMAT) 20-100 MCG/ACT AERS respimat Inhale 1 puff into the lungs every 6 (six) hours. 4 g 5  . ipratropium-albuterol (DUONEB) 0.5-2.5 (3) MG/3ML SOLN Take 3 mLs by nebulization 2 (two) times daily as needed. DX: J44.9 180 mL 2  . KLOR-CON M20 20 MEQ tablet TAKE 1 TABLET EVERY DAY 180 tablet 3  .  lactulose, encephalopathy, (CHRONULAC) 10 GM/15ML SOLN Take 30 mLs (20 g total) by mouth 3 (three) times daily as needed (Constipation). 1892 mL 1  . montelukast (SINGULAIR) 10 MG tablet Take 1 tablet (10 mg total) by mouth at bedtime. 30 tablet 5  . Multiple Vitamins-Minerals (MULTIVITAMIN WITH MINERALS) tablet Take 1 tablet by mouth every morning.     . nortriptyline (PAMELOR) 25 MG capsule Take 2 capsules (50 mg total) by mouth at bedtime. 60 capsule 11  . Omega 3 1200 MG CAPS Take 1,200 mg by mouth every morning.     . ondansetron (ZOFRAN) 8 MG tablet Take 1 tablet (8 mg total) by mouth 2 (two) times daily as needed for nausea or vomiting. 30 tablet 11  . OXYGEN-HELIUM IN Inhale 3 L into the lungs as needed.    . pantoprazole (PROTONIX) 40 MG tablet Take 1 tablet (40 mg total) by mouth every morning. 90 tablet 1  . Pitavastatin Calcium (LIVALO) 2 MG TABS Take 1 tablet (2 mg total) by mouth daily. 90 tablet 3  . SYMBICORT 160-4.5 MCG/ACT inhaler INHALE 2 PUFFS INTO THE LUNGS 2 (TWO) TIMES DAILY. 30 Inhaler 3   No current facility-administered medications for this visit.    LABS/IMAGING: No results found for this or any previous visit (from the past 48 hour(s)). No results  found.  VITALS: BP 98/66 mmHg  Pulse 84  Ht 5\' 2"  (1.575 m)  Wt 121 lb 11.2 oz (55.203 kg)  BMI 22.25 kg/m2  EXAM: General appearance: alert, appears older than stated age, no distress and on oxygen by nasal cannula Neck: no carotid bruit and no JVD Lungs: diminished breath sounds bilaterally and faint rhonchi Heart: regular rate and rhythm, S1, S2 normal and systolic murmur: early systolic 3/6, crescendo at 2nd right intercostal space Abdomen: soft, non-tender; bowel sounds normal; no masses,  no organomegaly Extremities: extremities normal, atraumatic, no cyanosis or edema Pulses: 2+ and symmetric Skin: Skin color, texture, turgor normal. No rashes or lesions Neurologic: Grossly normal Psych: At baseline mood, affect  EKG: Normal sinus rhythm at 84  ASSESSMENT: 1. Severe COPD with severe pulmonary hypertension, on chronic oxygen with recent exacerbation 2. Mild aortic stenosis 3. Low risk nuclear stress testing in 2012 4. Dyslipidemia - on livalo   5. History of CAD with prior remote PCI  PLAN: 1.   Ms. Dhawan is doing well with her pulmonary disease which is stable. She's not been hospitalized over the past 6 months. She will be due for repeat check of her aortic stenosis which is been mild by echo likely at her next office visit. She is on Livalo for elevated cholesterol and will need retesting in January. I provided her a lab slip for that today which can be drawn at her primary care doctor's office. From a cardiac standpoint she seems to be pretty stable. Weight is unchanged and there is no significant lower extremity swelling. She is scheduled to see her pulmonologist next week. No changes on her medications today and we'll see her back in 6 months.  Pixie Casino, MD, [facility] Attending Cardiologist [facility]  Nadean Corwin Brietta Manso 08/12/2015, 1:25 PM

## 2015-08-12 NOTE — Patient Instructions (Signed)
Your physician recommends that you return for lab work in January - FASTING  Your physician wants you to follow-up in:  6 months with Dr. Debara Pickett. You will receive a reminder letter in the mail two months in advance. If you don't receive a letter, please call our office to schedule the follow-up appointment.

## 2015-08-15 ENCOUNTER — Telehealth: Payer: Self-pay

## 2015-08-15 DIAGNOSIS — F411 Generalized anxiety disorder: Secondary | ICD-10-CM

## 2015-08-15 MED ORDER — ALPRAZOLAM 1 MG PO TABS: ORAL_TABLET | ORAL | Status: DC

## 2015-08-15 NOTE — Telephone Encounter (Signed)
Rx written for a dose and quantity that I am comfortable with

## 2015-08-15 NOTE — Telephone Encounter (Signed)
Pt left message on triage line.   rq for rf for alprazolam. Called pharmacy and pt does not have any refills left.   There is a discrepancy of the quantity per day and per month.   Please advise on the number per day and #/script.

## 2015-08-15 NOTE — Telephone Encounter (Signed)
The original rx is for # 54.   An rx was done for # 30 and then #60 after that.  Additionally, the sig states tid.  Confirming that rx for # 60 is the correct per 30 d.

## 2015-08-18 NOTE — Telephone Encounter (Signed)
Patient called to check on this. Advised to allow more time for clarification to be discussed between stefannie and dr Ronnald Ramp

## 2015-08-19 ENCOUNTER — Encounter: Payer: Self-pay | Admitting: Pulmonary Disease

## 2015-08-19 ENCOUNTER — Ambulatory Visit (INDEPENDENT_AMBULATORY_CARE_PROVIDER_SITE_OTHER): Payer: Medicare Other | Admitting: Pulmonary Disease

## 2015-08-19 VITALS — BP 130/68 | HR 86 | Ht 62.0 in | Wt 123.0 lb

## 2015-08-19 DIAGNOSIS — J432 Centrilobular emphysema: Secondary | ICD-10-CM

## 2015-08-19 DIAGNOSIS — Z720 Tobacco use: Secondary | ICD-10-CM | POA: Diagnosis not present

## 2015-08-19 DIAGNOSIS — I2510 Atherosclerotic heart disease of native coronary artery without angina pectoris: Secondary | ICD-10-CM

## 2015-08-19 NOTE — Telephone Encounter (Signed)
Per MD she is to take this medication twice daily #60 this is what he is comfortable with no exceptions. n

## 2015-08-19 NOTE — Progress Notes (Signed)
Subjective:    Patient ID: [name], female    DOB: 23-Sep-1946, 69 y.o.   MRN: [id number]  Synopsis: [name] has COPD and requires 3L [place] at rest and is typically non-compliant with her oxygen therapy. She has never had PFTs.  We were worried about pulmonary hypertension in 2014 so we performed a RHC which showed normal pulmonary pressures.    HPI Chief Complaint  Patient presents with  . Follow-up    pt has no complaints today.     [name] says that the breathing as been OK lately.  She took the Zpack we called in a few weeks ago and it really helped.  She has not been treated with anything else by another provider in terms of her COPD.  She said that her cough with mucus production lasted for 3 weeks but eventually went away.   Symbicort is not covered by her insurance company and she will need another prior authorization in January.  She tried Advair which was not helpful in terms of her dyspnea and cough.   She is again asking for benzodiazepines.    Past Medical History  Diagnosis Date  . Angina   . Heart murmur   . Depressed   . Coronary artery disease 2006    2 stents w/previous MI  . Hypertension   . Migraine   . Anxiety   . Myocardial infarction [facility]) 2006    NSTEMI  . COPD (chronic obstructive pulmonary disease) (HCC)     oxygen-dependent 4LPM [place]  . Moderate to severe pulmonary hypertension ([place])   . Diastolic CHF ([place])   . Cancer of breast ([place]) dx'd 2008    RT Breast  . Aortic stenosis     mild  . Dyslipidemia   . NSVT (nonsustained ventricular tachycardia) (HCC)     h/o  . History of nuclear stress test 08/03/2011    attenuation at apex - no perfusion defects   . Abnormal LFTs 08/02/2011  . Acute liver failure 06/19/2013  . Acute renal failure ([place]) 06/19/2013  . Acute respiratory failure ([place]) 06/19/2013  . Altered mental status 08/01/2011  . Anxiety state, unspecified 12/03/2013  . Breast cancer of upper-outer quadrant of left female breast ([place]) 08/21/2013   . CAD (coronary artery disease) of artery bypass graft 11/06/2013  . CAD (coronary artery disease), MI R/O 08/01/2011    PCI in 2006 (bare metal stent, unknown artery) - [place]   . CHF,  acute diastolic, BNP 4k on admissio 08/01/2011  . Chronic respiratory failure ([place]) 02/02/2012  . Compression fracture of L1 lumbar vertebra ([place]) 02/02/2012  . Mild aortic stenosis 08/01/2011    AVA 1.69 cm2 (06/02/2011)   . Hyponatremia 01/31/2012  . Hyperkalemia, on ACE prior to admission 11/11/2011  . HTN (hypertension) 11/06/2013  . History of breast cancer in female 06/23/2013  . 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  HENT: Negative for postnasal drip, rhinorrhea and sinus pressure.   Respiratory: Negative for cough, shortness of breath and wheezing.   Cardiovascular: Negative for chest pain, palpitations and leg swelling.       Objective:   Physical Exam Filed Vitals:   08/19/15 1555  BP: 130/68  Pulse: 86  Height: 5\' 2"  (1.575 m)  Weight: 123 lb (55.792 kg)  SpO2: 95%   3L [place]  Gen: no acute distress HEENT: NCAT, oropharynx clear, neck supple PULM: scattered wheeze in bases,  otherwise clear, normal effort CV: RRR, no mgr AB: BS+, soft, nontender Ext: warm, acyanotic, no edema  10/2014 CT screening chest > lung RADS 2, radiation fibrosis noted.        Assessment & Plan:   COPD (chronic obstructive pulmonary disease) ([place]) Aside from the mild exacerbation a few weeks ago this has been a stable interval for her. He has cut back significantly on her tobacco use but has not stopped altogether. She still needs albuterol from time to time. I explained to her today that I think she would need to use the rescue inhaler less if she would stop smoking altogether.  Flu and pneumonia vaccines up to date  Plan: Continue Symbicort, she has not benefited  from Advair Stop smoking   Tobacco abuse Counseled to quit. Continue lung  cancer screening.    Updated Medication List Outpatient Encounter Prescriptions as of 08/19/2015  Medication Sig  . ALPRAZolam (XANAX) 1 MG tablet TAKE 1 TABLET THREE TIMES A DAY AS NEEDED FOR SLEEP OR ANXIETY  . aspirin 325 MG EC tablet Take 325 mg by mouth every morning.   Marland Kitchen aspirin-acetaminophen-caffeine (EXCEDRIN MIGRAINE) 250-250-65 MG per tablet Take 2 tablets by mouth daily as needed for headache.  . butalbital-acetaminophen-caffeine (FIORICET) 50-325-40 MG per tablet Take 1-2 pills as needed for headache  . calcium-vitamin D (OSCAL WITH D) 250-125 MG-UNIT per tablet Take 1 tablet by mouth every morning.   . diclofenac sodium (VOLTAREN) 1 % GEL Apply 4 g topically daily as needed (apply to hands). Hand pain  . furosemide (LASIX) 40 MG tablet Take 1 tablet (40 mg total) by mouth 2 (two) times daily.  . Ipratropium-Albuterol (COMBIVENT RESPIMAT) 20-100 MCG/ACT AERS respimat Inhale 1 puff into the lungs every 6 (six) hours.  Marland Kitchen ipratropium-albuterol (DUONEB) 0.5-2.5 (3) MG/3ML SOLN Take 3 mLs by nebulization 2 (two) times daily as needed. DX: J44.9  . KLOR-CON M20 20 MEQ tablet TAKE 1 TABLET EVERY DAY  . lactulose, encephalopathy, (CHRONULAC) 10 GM/15ML SOLN Take 30 mLs (20 g total) by mouth 3 (three) times daily as needed (Constipation).  . montelukast (SINGULAIR) 10 MG tablet Take 1 tablet (10 mg total) by mouth at bedtime.  . Multiple Vitamins-Minerals (MULTIVITAMIN WITH MINERALS) tablet Take 1 tablet by mouth every morning.   . nortriptyline (PAMELOR) 25 MG capsule Take 2 capsules (50 mg total) by mouth at bedtime.  . Omega 3 1200 MG CAPS Take 1,200 mg by mouth every morning.   . ondansetron (ZOFRAN) 8 MG tablet Take 1 tablet (8 mg total) by mouth 2 (two) times daily as needed for nausea or vomiting.  . OXYGEN-HELIUM IN Inhale 3 L into the lungs as needed.  . pantoprazole (PROTONIX) 40 MG tablet Take 1 tablet (40 mg total) by mouth every morning.  . Pitavastatin Calcium (LIVALO) 2 MG  TABS Take 1 tablet (2 mg total) by mouth daily.  . SYMBICORT 160-4.5 MCG/ACT inhaler INHALE 2 PUFFS INTO THE LUNGS 2 (TWO) TIMES DAILY.   No facility-administered encounter medications on file as of 08/19/2015.

## 2015-08-19 NOTE — Patient Instructions (Signed)
Keep taking medications as prescribed. Follow up in 6 months

## 2015-08-19 NOTE — Assessment & Plan Note (Signed)
Counseled to quit. Continue lung cancer screening.

## 2015-08-19 NOTE — Telephone Encounter (Signed)
Patient called. Advised of the below. She states that she is out and she cannot get any until the end of the month. She states that she does not understand why it changed from #90 to #60.  Gabby, please follow up with this patient.

## 2015-08-19 NOTE — Assessment & Plan Note (Signed)
Aside from the mild exacerbation a few weeks ago this has been a stable interval for her. He has cut back significantly on her tobacco use but has not stopped altogether. She still needs albuterol from time to time. I explained to her today that I think she would need to use the rescue inhaler less if she would stop smoking altogether.  Flu and pneumonia vaccines up to date  Plan: Continue Symbicort, she has not benefited  from Advair Stop smoking

## 2015-08-20 NOTE — Telephone Encounter (Signed)
Tammy informed pt of MD decision when she was up here for an apt wth another provider. She states she will address in it next OV. Closing phone note.

## 2015-08-23 ENCOUNTER — Other Ambulatory Visit: Payer: Self-pay | Admitting: Pulmonary Disease

## 2015-08-26 ENCOUNTER — Other Ambulatory Visit: Payer: Self-pay

## 2015-08-26 MED ORDER — LACTULOSE ENCEPHALOPATHY 10 GM/15ML PO SOLN: 20.0000 g | Freq: Three times a day (TID) | ORAL | Status: DC | PRN

## 2015-08-28 ENCOUNTER — Other Ambulatory Visit: Payer: Self-pay | Admitting: Internal Medicine

## 2015-08-28 ENCOUNTER — Telehealth: Payer: Self-pay | Admitting: Internal Medicine

## 2015-08-28 MED ORDER — MONTELUKAST SODIUM 10 MG PO TABS: 10.0000 mg | ORAL_TABLET | Freq: Every day | ORAL | Status: DC

## 2015-08-28 MED ORDER — ALBUTEROL SULFATE HFA 108 (90 BASE) MCG/ACT IN AERS: 2.0000 | INHALATION_SPRAY | Freq: Four times a day (QID) | RESPIRATORY_TRACT | Status: DC | PRN

## 2015-08-28 NOTE — Telephone Encounter (Signed)
Pt also need refill for Ventolin hfa inhaler as well.

## 2015-08-28 NOTE — Telephone Encounter (Signed)
Cindy called from [facility] request refill for montelukast (SINGULAIR) 10 MG tablet , please help

## 2015-08-28 NOTE — Telephone Encounter (Signed)
done

## 2015-09-01 ENCOUNTER — Other Ambulatory Visit: Payer: Self-pay

## 2015-09-01 MED ORDER — MONTELUKAST SODIUM 10 MG PO TABS: 10.0000 mg | ORAL_TABLET | Freq: Every day | ORAL | Status: DC

## 2015-09-01 MED ORDER — ALBUTEROL SULFATE HFA 108 (90 BASE) MCG/ACT IN AERS: 2.0000 | INHALATION_SPRAY | Freq: Four times a day (QID) | RESPIRATORY_TRACT | Status: DC | PRN

## 2015-09-03 ENCOUNTER — Emergency Department (HOSPITAL_COMMUNITY): Payer: Medicare Other

## 2015-09-03 ENCOUNTER — Inpatient Hospital Stay (HOSPITAL_COMMUNITY)
Admission: EM | Admit: 2015-09-03 | Discharge: 2015-09-07 | DRG: 445 | Disposition: A | Discharge status: Home / Self Care | Payer: Medicare Other | Attending: Family Medicine | Admitting: Family Medicine

## 2015-09-03 DIAGNOSIS — J9610 Chronic respiratory failure, unspecified whether with hypoxia or hypercapnia: Secondary | ICD-10-CM | POA: Diagnosis present

## 2015-09-03 DIAGNOSIS — I252 Old myocardial infarction: Secondary | ICD-10-CM

## 2015-09-03 DIAGNOSIS — I2510 Atherosclerotic heart disease of native coronary artery without angina pectoris: Secondary | ICD-10-CM | POA: Diagnosis present

## 2015-09-03 DIAGNOSIS — E785 Hyperlipidemia, unspecified: Secondary | ICD-10-CM | POA: Diagnosis present

## 2015-09-03 DIAGNOSIS — R945 Abnormal results of liver function studies: Secondary | ICD-10-CM

## 2015-09-03 DIAGNOSIS — R54 Age-related physical debility: Secondary | ICD-10-CM | POA: Diagnosis present

## 2015-09-03 DIAGNOSIS — J449 Chronic obstructive pulmonary disease, unspecified: Secondary | ICD-10-CM | POA: Diagnosis present

## 2015-09-03 DIAGNOSIS — K8050 Calculus of bile duct without cholangitis or cholecystitis without obstruction: Secondary | ICD-10-CM | POA: Insufficient documentation

## 2015-09-03 DIAGNOSIS — Z9981 Dependence on supplemental oxygen: Secondary | ICD-10-CM

## 2015-09-03 DIAGNOSIS — K8030 Calculus of bile duct with cholangitis, unspecified, without obstruction: Principal | ICD-10-CM | POA: Diagnosis present

## 2015-09-03 DIAGNOSIS — I10 Essential (primary) hypertension: Secondary | ICD-10-CM | POA: Diagnosis present

## 2015-09-03 DIAGNOSIS — I5032 Chronic diastolic (congestive) heart failure: Secondary | ICD-10-CM | POA: Diagnosis present

## 2015-09-03 DIAGNOSIS — Z955 Presence of coronary angioplasty implant and graft: Secondary | ICD-10-CM

## 2015-09-03 DIAGNOSIS — R1011 Right upper quadrant pain: Secondary | ICD-10-CM

## 2015-09-03 DIAGNOSIS — K8309 Other cholangitis: Secondary | ICD-10-CM

## 2015-09-03 DIAGNOSIS — Z833 Family history of diabetes mellitus: Secondary | ICD-10-CM

## 2015-09-03 DIAGNOSIS — R7989 Other specified abnormal findings of blood chemistry: Secondary | ICD-10-CM | POA: Insufficient documentation

## 2015-09-03 DIAGNOSIS — Z9049 Acquired absence of other specified parts of digestive tract: Secondary | ICD-10-CM

## 2015-09-03 DIAGNOSIS — I25810 Atherosclerosis of coronary artery bypass graft(s) without angina pectoris: Secondary | ICD-10-CM | POA: Diagnosis present

## 2015-09-03 DIAGNOSIS — N179 Acute kidney failure, unspecified: Secondary | ICD-10-CM | POA: Diagnosis present

## 2015-09-03 DIAGNOSIS — Z853 Personal history of malignant neoplasm of breast: Secondary | ICD-10-CM

## 2015-09-03 DIAGNOSIS — R109 Unspecified abdominal pain: Secondary | ICD-10-CM | POA: Diagnosis not present

## 2015-09-03 DIAGNOSIS — Z8249 Family history of ischemic heart disease and other diseases of the circulatory system: Secondary | ICD-10-CM

## 2015-09-03 DIAGNOSIS — F17210 Nicotine dependence, cigarettes, uncomplicated: Secondary | ICD-10-CM | POA: Diagnosis present

## 2015-09-03 DIAGNOSIS — I272 Other secondary pulmonary hypertension: Secondary | ICD-10-CM | POA: Diagnosis present

## 2015-09-03 DIAGNOSIS — I350 Nonrheumatic aortic (valve) stenosis: Secondary | ICD-10-CM | POA: Diagnosis present

## 2015-09-03 LAB — I-STAT TROPONIN, ED: Troponin i, poc: 0.01 ng/mL (ref 0.00–0.08)

## 2015-09-03 LAB — CBC
HCT: 39 % (ref 36.0–46.0)
Hemoglobin: 12.8 g/dL (ref 12.0–15.0)
MCH: 31.6 pg (ref 26.0–34.0)
MCHC: 32.8 g/dL (ref 30.0–36.0)
MCV: 96.3 fL (ref 78.0–100.0)
Platelets: 280 10*3/uL (ref 150–400)
RBC: 4.05 MIL/uL (ref 3.87–5.11)
RDW: 14.5 % (ref 11.5–15.5)
WBC: 10 10*3/uL (ref 4.0–10.5)

## 2015-09-03 LAB — BASIC METABOLIC PANEL
Anion gap: 9 (ref 5–15)
BUN: 16 mg/dL (ref 6–20)
CO2: 34 mmol/L — ABNORMAL HIGH (ref 22–32)
Calcium: 9.9 mg/dL (ref 8.9–10.3)
Chloride: 97 mmol/L — ABNORMAL LOW (ref 101–111)
Creatinine, Ser: 1.04 mg/dL — ABNORMAL HIGH (ref 0.44–1.00)
GFR calc Af Amer: >60 mL/min (ref >60)
GFR calc non Af Amer: 54 mL/min — ABNORMAL LOW (ref >60)
Glucose, Bld: 135 mg/dL — ABNORMAL HIGH (ref 65–99)
Potassium: 3.9 mmol/L (ref 3.5–5.1)
Sodium: 140 mmol/L (ref 135–145)

## 2015-09-03 MED ORDER — IPRATROPIUM-ALBUTEROL 0.5-2.5 (3) MG/3ML IN SOLN
3.0000 mL | RESPIRATORY_TRACT | Status: DC
  Administered 2015-09-04: 3 mL via RESPIRATORY_TRACT
  Filled 2015-09-03: qty 3

## 2015-09-03 NOTE — ED Provider Notes (Signed)
CSN: [id number]     Arrival date & time 09/03/15  2307 History  By signing my name below, I, Helane Gunther, attest that this documentation has been prepared under the direction and in the presence of Everlene Balls, MD. Electronically Signed: Helane Gunther, ED Scribe. 09/03/2015. 11:56 PM.     Chief Complaint  Patient presents with  . abd pain /chest pain    The history is provided by the patient. No language interpreter was used.   HPI Comments: [name] is a 69 y.o. female smoker at 0.25 ppd with a PMHx of COPD, CAD, CHF, aortic stenosis, HTN, and renal failure who presents to the Emergency Department complaining of aching, burning, upper abdominal pain radiating into the lower back, as well as lower left-sided chest pain onset 5 hours ago. She notes her most recent BM was normal and occurred 2 hours ago. She states she has taken a Xanax 2 hours ago because she though she was having an anxiety attack, with no relief of the pains. She notes a PMHx of breast cancer from which she has fully recovered and notes she has been cancer free for the past 7 years. 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 She reports a PSHx of several surgeries on her abdomen including cholecystectomy, C-sections, and illeostomy. She denies a PMHx of gastritis or DVT. She denies any recent travel or surgeries, as well as denying use of estrogen or hormone pills. Pt denies n/v/d and difficulty urinating.   Past Medical History  Diagnosis Date  . Angina   . Heart murmur   . Depressed   . Coronary artery disease 2006    2 stents w/previous MI  . Hypertension   . Migraine   . Anxiety   . Myocardial infarction [facility]) 2006    NSTEMI  . COPD (chronic obstructive pulmonary disease) (HCC)     oxygen-dependent 4LPM [place]  . Moderate to severe pulmonary hypertension ([place])   . Diastolic CHF ([place])   . Cancer of breast ([place]) dx'd 2008    RT Breast  . Aortic stenosis     mild  . Dyslipidemia   . NSVT  (nonsustained ventricular tachycardia) (HCC)     h/o  . History of nuclear stress test 08/03/2011    attenuation at apex - no perfusion defects   . Abnormal LFTs 08/02/2011  . Acute liver failure 06/19/2013  . Acute renal failure ([place]) 06/19/2013  . Acute respiratory failure ([place]) 06/19/2013  . Altered mental status 08/01/2011  . Anxiety state, unspecified 12/03/2013  . Breast cancer of upper-outer quadrant of left female breast ([place]) 08/21/2013  . CAD (coronary artery disease) of artery bypass graft 11/06/2013  . CAD (coronary artery disease), MI R/O 08/01/2011    PCI in 2006 (bare metal stent, unknown artery) - [place]   . CHF,  acute diastolic, BNP 4k on admissio 08/01/2011  . Chronic respiratory failure ([place]) 02/02/2012  . Compression fracture of L1 lumbar vertebra ([place]) 02/02/2012  . Mild aortic stenosis 08/01/2011    AVA 1.69 cm2 (06/02/2011)   . Hyponatremia 01/31/2012  . Hyperkalemia, on ACE prior to admission 11/11/2011  . HTN (hypertension) 11/06/2013  . History of breast cancer in female 06/23/2013  . 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 Breast lumpectomy Right 2008  . Cardiac catheterization  2006  . Cholecystectomy  2011  . Abdominal surgery  2009  . Transthoracic echocardiogram  11/12/2011  EF 0000000, normal systolic function, grade 1 diastolic dysfunction; ventricular septal flattening (D-sign); mild AS; trace-mild MR; LA mildly dilated; RV mod dilated; RA mod dilated; severe pulm HTN; elevated CVP  . Right heart catheterization N/A 09/05/2013    Procedure: RIGHT HEART CATH;  Surgeon: Jolaine Artist, MD;  Location: [facility] CATH LAB;  Service: Cardiovascular;  Laterality: N/A;   Family History  Problem Relation Age of Onset  . Schizophrenia Sister   . Alzheimer's disease Mother   . Hyperlipidemia Brother   . Heart disease Sister   . Diabetes Sister   . Cancer Neg Hx   . Stroke Neg Hx   . COPD Neg Hx   . Depression Neg Hx   . Drug  abuse Neg Hx   . Early death Neg Hx   . Hypertension Neg Hx   . Kidney disease Neg Hx    Social History  Substance Use Topics  . Smoking status: Current Some Day Smoker -- 0.25 packs/day for 47 years    Types: Cigarettes  . Smokeless tobacco: Never Used     Comment: smokes when anxious, uses eCigs as well.   . Alcohol Use: No     Comment: wine with dinner occasionally   OB History    No data available     Review of Systems A complete 10 system review of systems was obtained and all systems are negative except as noted in the HPI and PMH.   Allergies  Ceftriaxone; Hydroxyzine; and Lexapro  Home Medications   Prior to Admission medications   Medication Sig Start Date End Date Taking? Authorizing Provider  albuterol (PROVENTIL HFA;VENTOLIN HFA) 108 (90 BASE) MCG/ACT inhaler Inhale 2 puffs into the lungs every 6 (six) hours as needed for wheezing or shortness of breath. 09/01/15  Yes [name], MD  ALPRAZolam Duanne Moron) 1 MG tablet TAKE 1 TABLET THREE TIMES A DAY AS NEEDED FOR SLEEP OR ANXIETY 08/15/15  Yes [name], MD  aspirin 325 MG EC tablet Take 325 mg by mouth every morning.    Yes Historical Provider, MD  aspirin-acetaminophen-caffeine (EXCEDRIN MIGRAINE) [phone] MG per tablet Take 2 tablets by mouth daily as needed for headache.   Yes Historical Provider, MD  butalbital-acetaminophen-caffeine (FIORICET) [phone] MG per tablet Take 1-2 pills as needed for headache 04/28/15  Yes [name], MD  calcium-vitamin D (OSCAL WITH D) 250-125 MG-UNIT per tablet Take 1 tablet by mouth every morning.    Yes Historical Provider, MD  COMBIVENT RESPIMAT 20-100 MCG/ACT AERS respimat INHALE 1 PUFF INTO THE LUNGS EVERY 6 (SIX) HOURS. 08/25/15  Yes Juanito Doom, MD  diclofenac sodium (VOLTAREN) 1 % GEL Apply 4 g topically daily as needed (apply to hands). Hand pain 08/04/15  Yes [name], MD  furosemide (LASIX) 40 MG tablet Take 1 tablet (40 mg total) by mouth 2 (two) times  daily. 07/25/15  Yes [name], MD  ipratropium-albuterol (DUONEB) 0.5-2.5 (3) MG/3ML SOLN Take 3 mLs by nebulization 2 (two) times daily as needed. DX: J44.9 Patient taking differently: Take 3 mLs by nebulization 2 (two) times daily as needed (for shortness of breath). DX: J44.9 05/07/15  Yes Juanito Doom, MD  KLOR-CON M20 20 MEQ tablet TAKE 1 TABLET EVERY DAY 09/15/14  Yes [name], MD  lactulose, encephalopathy, (CHRONULAC) 10 GM/15ML SOLN Take 30 mLs (20 g total) by mouth 3 (three) times daily as needed (Constipation). 08/26/15  Yes [name], MD  montelukast (  SINGULAIR) 10 MG tablet Take 1 tablet (10 mg total) by mouth at bedtime. 09/01/15  Yes [name], MD  Multiple Vitamins-Minerals (MULTIVITAMIN WITH MINERALS) tablet Take 1 tablet by mouth every morning.    Yes Historical Provider, MD  nortriptyline (PAMELOR) 25 MG capsule Take 2 capsules (50 mg total) by mouth at bedtime. 07/26/15  Yes [name], MD  Omega 3 1200 MG CAPS Take 1,200 mg by mouth every morning.    Yes Historical Provider, MD  ondansetron (ZOFRAN) 8 MG tablet Take 1 tablet (8 mg total) by mouth 2 (two) times daily as needed for nausea or vomiting. 04/10/14  Yes [name], MD  OXYGEN-HELIUM IN Inhale 3 L into the lungs See admin instructions. Uses when needed during the day, uses continuous throughout the night   Yes Historical Provider, MD  pantoprazole (PROTONIX) 40 MG tablet Take 1 tablet (40 mg total) by mouth every morning. 04/03/15  Yes [name], MD  Pitavastatin Calcium (LIVALO) 2 MG TABS Take 1 tablet (2 mg total) by mouth daily. 08/12/15  Yes Pixie Casino, MD  Pseudoeph-Doxylamine-DM-APAP (NYQUIL PO) Take 30 mLs by mouth at bedtime as needed (cold symptoms).   Yes Historical Provider, MD  SYMBICORT 160-4.5 MCG/ACT inhaler INHALE 2 PUFFS INTO THE LUNGS 2 (TWO) TIMES DAILY. 12/18/14  Yes [name], MD   BP 137/67 mmHg  Pulse 68  Resp 18  SpO2 99% Physical Exam   Constitutional: She is oriented to person, place, and time. She appears well-developed and well-nourished. No distress.  [place] in place  HENT:  Head: Normocephalic and atraumatic.  Nose: Nose normal.  Mouth/Throat: Oropharynx is clear and moist. No oropharyngeal exudate.  Eyes: Conjunctivae and EOM are normal. Pupils are equal, round, and reactive to light. No scleral icterus.  Neck: Normal range of motion. Neck supple. No JVD present. No tracheal deviation present. No thyromegaly present.  Cardiovascular: Normal rate, regular rhythm and normal heart sounds.  Exam reveals no gallop and no friction rub.   No murmur heard. Pulmonary/Chest: Effort normal. No respiratory distress. She has wheezes. She exhibits no tenderness.  Bilateral expiratory wheezing  Abdominal: Soft. Bowel sounds are normal. She exhibits no distension and no mass. There is tenderness. There is no rebound and no guarding.  epigastric TTP and a large (periumbilical hernia that is easily reducible  Musculoskeletal: Normal range of motion. She exhibits no edema or tenderness.  Lymphadenopathy:    She has no cervical adenopathy.  Neurological: She is alert and oriented to person, place, and time. No cranial nerve deficit. She exhibits normal muscle tone.  Skin: Skin is warm and dry. No rash noted. No erythema. No pallor.  Nursing note and vitals reviewed.   ED Course  Procedures  DIAGNOSTIC STUDIES: Oxygen Saturation is 94% on RA, low by my interpretation.    COORDINATION OF CARE: 11:53 PM - Discussed plans to order further diagnostic imaging, as well as a breathing treatment. Pt advised of plan for treatment and pt agrees.  Labs Review Labs Reviewed  BASIC METABOLIC PANEL - Abnormal; Notable for the following:    Chloride 97 (*)    CO2 34 (*)    Glucose, Bld 135 (*)    Creatinine, Ser 1.04 (*)    GFR calc non Af Amer 54 (*)    All other components within normal limits  BRAIN NATRIURETIC PEPTIDE - Abnormal; Notable  for the following:    B Natriuretic Peptide 253.4 (*)  All other components within normal limits  HEPATIC FUNCTION PANEL - Abnormal; Notable for the following:    AST 334 (*)    ALT 250 (*)    Alkaline Phosphatase 756 (*)    Bilirubin, Direct 0.7 (*)    Indirect Bilirubin 0.2 (*)    All other components within normal limits  LIPASE, BLOOD - Abnormal; Notable for the following:    Lipase 132 (*)    All other components within normal limits  CULTURE, BLOOD (ROUTINE X 2)  CULTURE, BLOOD (ROUTINE X 2)  CBC  I-STAT TROPOININ, ED    Imaging Review Dg Chest 2 View  09/03/2015  CLINICAL DATA:  69 year old female with abdominal pain and chest pain EXAM: CHEST  2 VIEW COMPARISON:  Chest radiograph dated 07/18/2014 and abdominal CT dated 01/01/2015 FINDINGS: Two views of the chest demonstrate emphysematous changes of the lungs with mild diffuse interstitial prominence. There is blunting of the costophrenic angles which may represent small pleural effusions. No focal consolidation or pneumothorax. Stable cardiac silhouette. The osseous structures appear unremarkable. Right axillary surgical clips noted. IMPRESSION: Mild diffuse interstitial prominence.  No focal consolidation. Electronically Signed   By: Anner Crete M.D.   On: 09/03/2015 23:44   Ct Abdomen Pelvis W Contrast  09/04/2015  CLINICAL DATA:  Acute onset of upper abdominal pain, radiating to the back. Elevated lipase. Initial encounter. EXAM: CT ABDOMEN AND PELVIS WITH CONTRAST TECHNIQUE: Multidetector CT imaging of the abdomen and pelvis was performed using the standard protocol following bolus administration of intravenous contrast. CONTRAST:  71mL OMNIPAQUE IOHEXOL 300 MG/ML  SOLN COMPARISON:  CT of the abdomen and pelvis from 01/30/2012, and right upper quadrant ultrasound performed earlier today at 1:18 a.m. FINDINGS: The visualized lung bases are clear. Scattered coronary artery calcifications are seen. There is diffuse prominence  of the intrahepatic biliary ducts, with prominence of the common bile duct to 1.4 cm. Surrounding soft tissue inflammation is noted. The patient is status post cholecystectomy, with clips noted along the gallbladder fossa. A 7 mm stone is noted at the distal common bile duct, stable from 2013. Though this may still be within normal limits, the mild soft tissue edema about the porta hepatis raises question for cholangitis. The pancreas and adrenal glands are unremarkable. The kidneys are unremarkable in appearance. There is no evidence of hydronephrosis. No renal or ureteral stones are seen. No perinephric stranding is appreciated. No free fluid is identified. The proximal small bowel is unremarkable in appearance. The stomach is within normal limits. No acute vascular abnormalities are seen. Scattered calcification is noted along the abdominal aorta and its branches. There is minimal ectasia of the distal abdominal aorta. A moderate anterior abdominal wall hernia is noted, containing a relatively long segment of small bowel. There is no evidence for obstruction. Postoperative change is noted along the ascending colon. The remaining colon is grossly unremarkable in appearance. The bladder is moderately distended and grossly remarkable. The uterus is unremarkable in appearance. The ovaries are grossly symmetric. No suspicious adnexal masses are seen. No inguinal lymphadenopathy is seen. No acute osseous abnormalities are identified. Facet disease noted along the lumbar spine, with mild grade 1 anterolisthesis of L4 on L5. There is mild chronic loss of height at L1 and L3. IMPRESSION: 1. Diffuse prominence of the intrahepatic biliary ducts, with prominence of the common bile duct to 1.4 cm. This is new from 2013, with new surrounding soft tissue inflammation at the porta hepatis. This raises question for cholangitis. Would  correlate for associated symptoms. 2. 7 mm stone at the distal common bile duct is stable from  2013. 3. Scattered coronary artery calcifications seen. 4. Scattered calcification along the abdominal aorta and its branches. 5. Moderate anterior abdominal wall hernia, containing a relatively long segment of small bowel. No evidence for obstruction. Electronically Signed   By: Garald Balding M.D.   On: 09/04/2015 02:40   US Abdomen Limited Ruq  09/04/2015  CLINICAL DATA:  Acute onset of generalized abdominal pain. Elevated LFTs and elevated lipase. Initial encounter. EXAM: US ABDOMEN LIMITED - RIGHT UPPER QUADRANT COMPARISON:  CT of the abdomen and pelvis performed 01/30/2012, and abdominal ultrasound performed 08/01/2011 FINDINGS: Gallbladder: Status post cholecystectomy.  No retained stones seen. Common bile duct: Diameter: 1.1 cm, within normal limits status post cholecystectomy. Liver: No focal lesion identified. Within normal limits in parenchymal echogenicity. IMPRESSION: Unremarkable ultrasound of the right upper quadrant. Status post cholecystectomy. Electronically Signed   By: Garald Balding M.D.   On: 09/04/2015 01:43   I have personally reviewed and evaluated these images and lab results as part of my medical decision-making.   EKG Interpretation   Date/Time:  Wednesday September 03 2015 23:20:07 EST Ventricular Rate:  57 PR Interval:  212 QRS Duration: 92 QT Interval:  425 QTC Calculation: 414 R Axis:   38 Text Interpretation:  Sinus rhythm Borderline prolonged PR interval  Probable left atrial enlargement Anteroseptal infarct, age indeterminate  No significant change since last tracing Confirmed by KNAPP  MD-J, JON  [id number]) on 09/03/2015 11:49:28 PM      MDM   Final diagnoses:  RUQ pain   Patient presents to the emergency department with abdominal pain. She is tender on examination. Upper studies are unremarkable, will obtain CT scan for evaluation of possible small bowel obstruction and she has a history of this in the past. She was given DuoNeb treatment for her wheezing.  Troponin is negative but not clinically indicated, ordered by triage.  Ultrasound does not show any acute findings. Laboratory studies show LFTs have increased to the 300s, alkaline phosphatase is 700, lipase is 130.  Will obtain CT scan for further diagnostics.  CT scan shows cholangitis. Blood cultures drawn, patient given Levaquin due to ceftriaxone allergy. I spoke with Dr. Jabier Mutton who accepts the patient to stepdown for further care.  I personally performed the services described in this documentation, which was scribed in my presence. The recorded information has been reviewed and is accurate.     Everlene Balls, MD 09/04/15 [phone]

## 2015-09-03 NOTE — ED Notes (Signed)
Pt from home co abd pain since 7 pm, also co chest pain , denies shortness of breath. Hx CHF and COPD. Pt denies nausea nor emesis. No diarrhea. Pt alert and oriented x 4.

## 2015-09-04 ENCOUNTER — Emergency Department (HOSPITAL_COMMUNITY): Payer: Medicare Other

## 2015-09-04 ENCOUNTER — Encounter (HOSPITAL_COMMUNITY): Payer: Self-pay

## 2015-09-04 ENCOUNTER — Inpatient Hospital Stay (HOSPITAL_COMMUNITY): Payer: Medicare Other

## 2015-09-04 DIAGNOSIS — R932 Abnormal findings on diagnostic imaging of liver and biliary tract: Secondary | ICD-10-CM | POA: Diagnosis not present

## 2015-09-04 DIAGNOSIS — Z853 Personal history of malignant neoplasm of breast: Secondary | ICD-10-CM | POA: Diagnosis not present

## 2015-09-04 DIAGNOSIS — R109 Unspecified abdominal pain: Secondary | ICD-10-CM | POA: Diagnosis present

## 2015-09-04 DIAGNOSIS — Z9981 Dependence on supplemental oxygen: Secondary | ICD-10-CM | POA: Diagnosis not present

## 2015-09-04 DIAGNOSIS — Z833 Family history of diabetes mellitus: Secondary | ICD-10-CM | POA: Diagnosis not present

## 2015-09-04 DIAGNOSIS — K8050 Calculus of bile duct without cholangitis or cholecystitis without obstruction: Secondary | ICD-10-CM | POA: Diagnosis not present

## 2015-09-04 DIAGNOSIS — R1010 Upper abdominal pain, unspecified: Secondary | ICD-10-CM

## 2015-09-04 DIAGNOSIS — Z8249 Family history of ischemic heart disease and other diseases of the circulatory system: Secondary | ICD-10-CM | POA: Diagnosis not present

## 2015-09-04 DIAGNOSIS — R7989 Other specified abnormal findings of blood chemistry: Secondary | ICD-10-CM

## 2015-09-04 DIAGNOSIS — K830 Cholangitis: Secondary | ICD-10-CM

## 2015-09-04 DIAGNOSIS — J42 Unspecified chronic bronchitis: Secondary | ICD-10-CM

## 2015-09-04 DIAGNOSIS — I10 Essential (primary) hypertension: Secondary | ICD-10-CM | POA: Diagnosis present

## 2015-09-04 DIAGNOSIS — I350 Nonrheumatic aortic (valve) stenosis: Secondary | ICD-10-CM | POA: Diagnosis present

## 2015-09-04 DIAGNOSIS — I252 Old myocardial infarction: Secondary | ICD-10-CM | POA: Diagnosis not present

## 2015-09-04 DIAGNOSIS — I272 Other secondary pulmonary hypertension: Secondary | ICD-10-CM | POA: Diagnosis present

## 2015-09-04 DIAGNOSIS — R54 Age-related physical debility: Secondary | ICD-10-CM | POA: Diagnosis present

## 2015-09-04 DIAGNOSIS — R1011 Right upper quadrant pain: Secondary | ICD-10-CM | POA: Diagnosis not present

## 2015-09-04 DIAGNOSIS — Z955 Presence of coronary angioplasty implant and graft: Secondary | ICD-10-CM | POA: Diagnosis not present

## 2015-09-04 DIAGNOSIS — I5032 Chronic diastolic (congestive) heart failure: Secondary | ICD-10-CM | POA: Diagnosis present

## 2015-09-04 DIAGNOSIS — J9610 Chronic respiratory failure, unspecified whether with hypoxia or hypercapnia: Secondary | ICD-10-CM | POA: Diagnosis present

## 2015-09-04 DIAGNOSIS — Z9049 Acquired absence of other specified parts of digestive tract: Secondary | ICD-10-CM | POA: Diagnosis not present

## 2015-09-04 DIAGNOSIS — K8030 Calculus of bile duct with cholangitis, unspecified, without obstruction: Secondary | ICD-10-CM | POA: Diagnosis present

## 2015-09-04 DIAGNOSIS — F17210 Nicotine dependence, cigarettes, uncomplicated: Secondary | ICD-10-CM | POA: Diagnosis present

## 2015-09-04 DIAGNOSIS — N179 Acute kidney failure, unspecified: Secondary | ICD-10-CM | POA: Diagnosis present

## 2015-09-04 DIAGNOSIS — J449 Chronic obstructive pulmonary disease, unspecified: Secondary | ICD-10-CM | POA: Diagnosis present

## 2015-09-04 DIAGNOSIS — K8309 Other cholangitis: Secondary | ICD-10-CM | POA: Insufficient documentation

## 2015-09-04 DIAGNOSIS — E785 Hyperlipidemia, unspecified: Secondary | ICD-10-CM | POA: Diagnosis present

## 2015-09-04 DIAGNOSIS — I25810 Atherosclerosis of coronary artery bypass graft(s) without angina pectoris: Secondary | ICD-10-CM | POA: Diagnosis present

## 2015-09-04 LAB — CBC WITH DIFFERENTIAL/PLATELET
Basophils Absolute: 0 10*3/uL (ref 0.0–0.1)
Basophils Relative: 0 %
Eosinophils Absolute: 0.1 10*3/uL (ref 0.0–0.7)
Eosinophils Relative: 2 %
HCT: 36.4 % (ref 36.0–46.0)
Hemoglobin: 11.6 g/dL — ABNORMAL LOW (ref 12.0–15.0)
Lymphocytes Relative: 13 %
Lymphs Abs: 1.1 10*3/uL (ref 0.7–4.0)
MCH: 31.2 pg (ref 26.0–34.0)
MCHC: 31.9 g/dL (ref 30.0–36.0)
MCV: 97.8 fL (ref 78.0–100.0)
Monocytes Absolute: 0.7 10*3/uL (ref 0.1–1.0)
Monocytes Relative: 8 %
Neutro Abs: 6.5 10*3/uL (ref 1.7–7.7)
Neutrophils Relative %: 77 %
Platelets: 251 10*3/uL (ref 150–400)
RBC: 3.72 MIL/uL — ABNORMAL LOW (ref 3.87–5.11)
RDW: 14.7 % (ref 11.5–15.5)
WBC: 8.5 10*3/uL (ref 4.0–10.5)

## 2015-09-04 LAB — BASIC METABOLIC PANEL
Anion gap: 8 (ref 5–15)
BUN: 14 mg/dL (ref 6–20)
CO2: 31 mmol/L (ref 22–32)
Calcium: 9.3 mg/dL (ref 8.9–10.3)
Chloride: 96 mmol/L — ABNORMAL LOW (ref 101–111)
Creatinine, Ser: 0.77 mg/dL (ref 0.44–1.00)
GFR calc Af Amer: >60 mL/min (ref >60)
GFR calc non Af Amer: >60 mL/min (ref >60)
Glucose, Bld: 100 mg/dL — ABNORMAL HIGH (ref 65–99)
Potassium: 3.8 mmol/L (ref 3.5–5.1)
Sodium: 135 mmol/L (ref 135–145)

## 2015-09-04 LAB — TROPONIN I
Troponin I: <0.03 ng/mL (ref <0.031)
Troponin I: <0.03 ng/mL (ref <0.031)
Troponin I: <0.03 ng/mL (ref <0.031)

## 2015-09-04 LAB — HEPATIC FUNCTION PANEL
ALT: 226 U/L — ABNORMAL HIGH (ref 14–54)
ALT: 250 U/L — ABNORMAL HIGH (ref 14–54)
AST: 251 U/L — ABNORMAL HIGH (ref 15–41)
AST: 334 U/L — ABNORMAL HIGH (ref 15–41)
Albumin: 3.1 g/dL — ABNORMAL LOW (ref 3.5–5.0)
Albumin: 3.6 g/dL (ref 3.5–5.0)
Alkaline Phosphatase: 603 U/L — ABNORMAL HIGH (ref 38–126)
Alkaline Phosphatase: 756 U/L — ABNORMAL HIGH (ref 38–126)
Bilirubin, Direct: 0.7 mg/dL — ABNORMAL HIGH (ref 0.1–0.5)
Bilirubin, Direct: 1 mg/dL — ABNORMAL HIGH (ref 0.1–0.5)
Indirect Bilirubin: 0.2 mg/dL — ABNORMAL LOW (ref 0.3–0.9)
Indirect Bilirubin: 0.6 mg/dL (ref 0.3–0.9)
Total Bilirubin: 0.9 mg/dL (ref 0.3–1.2)
Total Bilirubin: 1.6 mg/dL — ABNORMAL HIGH (ref 0.3–1.2)
Total Protein: 7.5 g/dL (ref 6.5–8.1)
Total Protein: 8 g/dL (ref 6.5–8.1)

## 2015-09-04 LAB — LIPASE, BLOOD
Lipase: 132 U/L — ABNORMAL HIGH (ref 11–51)
Lipase: 76 U/L — ABNORMAL HIGH (ref 11–51)

## 2015-09-04 LAB — GLUCOSE, CAPILLARY
Glucose-Capillary: 100 mg/dL — ABNORMAL HIGH (ref 65–99)
Glucose-Capillary: 65 mg/dL (ref 65–99)
Glucose-Capillary: 80 mg/dL (ref 65–99)
Glucose-Capillary: 68 mg/dL (ref 65–99)
Glucose-Capillary: 125 mg/dL — ABNORMAL HIGH (ref 65–99)

## 2015-09-04 LAB — BRAIN NATRIURETIC PEPTIDE: B Natriuretic Peptide: 253.4 pg/mL — ABNORMAL HIGH (ref 0.0–100.0)

## 2015-09-04 MED ORDER — ACETAMINOPHEN 650 MG RE SUPP: 650.0000 mg | Freq: Four times a day (QID) | RECTAL | Status: DC | PRN

## 2015-09-04 MED ORDER — IPRATROPIUM-ALBUTEROL 0.5-2.5 (3) MG/3ML IN SOLN
3.0000 mL | Freq: Two times a day (BID) | RESPIRATORY_TRACT | Status: DC
  Administered 2015-09-04 – 2015-09-07 (×6): 3 mL via RESPIRATORY_TRACT
  Filled 2015-09-04 (×6): qty 3

## 2015-09-04 MED ORDER — MORPHINE SULFATE (PF) 2 MG/ML IV SOLN
1.0000 mg | INTRAVENOUS | Status: DC | PRN
  Administered 2015-09-06 (×2): 1 mg via INTRAVENOUS
  Filled 2015-09-04 (×3): qty 1

## 2015-09-04 MED ORDER — DEXTROSE 50 % IV SOLN
1.0000 | Freq: Once | INTRAVENOUS | Status: DC
  Filled 2015-09-04: qty 50

## 2015-09-04 MED ORDER — ONDANSETRON HCL 4 MG/2ML IJ SOLN
4.0000 mg | Freq: Four times a day (QID) | INTRAMUSCULAR | Status: DC | PRN
  Administered 2015-09-05: 4 mg via INTRAVENOUS
  Filled 2015-09-04: qty 2

## 2015-09-04 MED ORDER — ALBUTEROL SULFATE (2.5 MG/3ML) 0.083% IN NEBU: 2.5000 mg | INHALATION_SOLUTION | RESPIRATORY_TRACT | Status: DC | PRN

## 2015-09-04 MED ORDER — BUDESONIDE-FORMOTEROL FUMARATE 160-4.5 MCG/ACT IN AERO
2.0000 | INHALATION_SPRAY | Freq: Two times a day (BID) | RESPIRATORY_TRACT | Status: DC
  Administered 2015-09-04 – 2015-09-07 (×7): 2 via RESPIRATORY_TRACT
  Filled 2015-09-04: qty 6

## 2015-09-04 MED ORDER — LIDOCAINE-EPINEPHRINE 1 %-1:100000 IJ SOLN: 10.0000 mL | Freq: Once | INTRAMUSCULAR | Status: DC

## 2015-09-04 MED ORDER — SODIUM CHLORIDE 0.9 % IV SOLN
250.0000 mg | Freq: Four times a day (QID) | INTRAVENOUS | Status: DC
  Administered 2015-09-04 – 2015-09-06 (×9): 250 mg via INTRAVENOUS
  Filled 2015-09-04 (×10): qty 250

## 2015-09-04 MED ORDER — ACETAMINOPHEN 325 MG PO TABS: 650.0000 mg | ORAL_TABLET | Freq: Four times a day (QID) | ORAL | Status: DC | PRN

## 2015-09-04 MED ORDER — GADOBENATE DIMEGLUMINE 529 MG/ML IV SOLN
15.0000 mL | Freq: Once | INTRAVENOUS | Status: AC | PRN
  Administered 2015-09-04: 11 mL via INTRAVENOUS

## 2015-09-04 MED ORDER — MORPHINE SULFATE (PF) 4 MG/ML IV SOLN
6.0000 mg | Freq: Once | INTRAVENOUS | Status: AC
  Administered 2015-09-04: 6 mg via INTRAVENOUS
  Filled 2015-09-04: qty 2

## 2015-09-04 MED ORDER — ONDANSETRON HCL 4 MG PO TABS: 4.0000 mg | ORAL_TABLET | Freq: Four times a day (QID) | ORAL | Status: DC | PRN

## 2015-09-04 MED ORDER — IPRATROPIUM-ALBUTEROL 0.5-2.5 (3) MG/3ML IN SOLN
3.0000 mL | RESPIRATORY_TRACT | Status: DC
  Administered 2015-09-04: 3 mL via RESPIRATORY_TRACT
  Filled 2015-09-04: qty 3

## 2015-09-04 MED ORDER — SODIUM CHLORIDE 0.9 % IV SOLN
INTRAVENOUS | Status: AC
  Administered 2015-09-04 (×2): via INTRAVENOUS

## 2015-09-04 MED ORDER — IOHEXOL 300 MG/ML  SOLN
50.0000 mL | Freq: Once | INTRAMUSCULAR | Status: AC | PRN
  Administered 2015-09-04: 50 mL via ORAL

## 2015-09-04 MED ORDER — LEVOFLOXACIN IN D5W 750 MG/150ML IV SOLN
750.0000 mg | Freq: Once | INTRAVENOUS | Status: AC
  Administered 2015-09-04: 750 mg via INTRAVENOUS
  Filled 2015-09-04: qty 150

## 2015-09-04 MED ORDER — IOHEXOL 300 MG/ML  SOLN
80.0000 mL | Freq: Once | INTRAMUSCULAR | Status: AC | PRN
  Administered 2015-09-04: 80 mL via INTRAVENOUS

## 2015-09-04 MED ORDER — IPRATROPIUM-ALBUTEROL 0.5-2.5 (3) MG/3ML IN SOLN: 3.0000 mL | Freq: Two times a day (BID) | RESPIRATORY_TRACT | Status: DC

## 2015-09-04 NOTE — H&P (Signed)
[facility] History and Physical  [name] [id number] DOB: 1945/10/02 DOA: 09/03/2015  Referring physician: Dr.Oni. PCP: Scarlette Calico, MD  Specialists: Dr.Hilty. Cardiologist.  Chief Complaint: Abdominal pain.  HPI: [name] is a 69 y.o. female with history of CAD status post stenting, COPD, pulmonary hypertension, dyslipidemia and tobacco abuse presents to the ER because of sudden onset of abdominal pain last evening. Patient's abdominal pain is mostly in the upper quadrants of the abdomen. Denies any nausea vomiting diarrhea fever chills. Denies any chest pain or shortness of breath. In the ER patient was found to have elevated LFTs and CT abdomen and pelvis shows features concerning for acute cholangitis. Patient has been admitted for further management. Patient has had previous history of cholecystectomy.   Review of Systems: As presented in the history of presenting illness, rest negative.  Past Medical History  Diagnosis Date  . Angina   . Heart murmur   . Depressed   . Coronary artery disease 2006    2 stents w/previous MI  . Hypertension   . Migraine   . Anxiety   . Myocardial infarction [facility]) 2006    NSTEMI  . COPD (chronic obstructive pulmonary disease) (HCC)     oxygen-dependent 4LPM [place]  . Moderate to severe pulmonary hypertension ([place])   . Diastolic CHF ([place])   . Cancer of breast ([place]) dx'd 2008    RT Breast  . Aortic stenosis     mild  . Dyslipidemia   . NSVT (nonsustained ventricular tachycardia) (HCC)     h/o  . History of nuclear stress test 08/03/2011    attenuation at apex - no perfusion defects   . Abnormal LFTs 08/02/2011  . Acute liver failure 06/19/2013  . Acute renal failure ([place]) 06/19/2013  . Acute respiratory failure ([place]) 06/19/2013  . Altered mental status 08/01/2011  . Anxiety state, unspecified 12/03/2013  . Breast cancer of upper-outer quadrant of left female breast ([place]) 08/21/2013  . CAD (coronary artery disease) of  artery bypass graft 11/06/2013  . CAD (coronary artery disease), MI R/O 08/01/2011    PCI in 2006 (bare metal stent, unknown artery) - [place]   . CHF,  acute diastolic, BNP 4k on admissio 08/01/2011  . Chronic respiratory failure ([place]) 02/02/2012  . Compression fracture of L1 lumbar vertebra ([place]) 02/02/2012  . Mild aortic stenosis 08/01/2011    AVA 1.69 cm2 (06/02/2011)   . Hyponatremia 01/31/2012  . Hyperkalemia, on ACE prior to admission 11/11/2011  . HTN (hypertension) 11/06/2013  . History of breast cancer in female 06/23/2013  . 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 Breast lumpectomy Right 2008  . Cardiac catheterization  2006  . Cholecystectomy  2011  . Abdominal surgery  2009  . Transthoracic echocardiogram  11/12/2011    EF 0000000, normal systolic function, grade 1 diastolic dysfunction; ventricular septal flattening (D-sign); mild AS; trace-mild MR; LA mildly dilated; RV mod dilated; RA mod dilated; severe pulm HTN; elevated CVP  . Right heart catheterization N/A 09/05/2013    Procedure: RIGHT HEART CATH;  Surgeon: Jolaine Artist, MD;  Location: [facility] CATH LAB;  Service: Cardiovascular;  Laterality: N/A;   Social History:  reports that she has been smoking Cigarettes.  She has a 11.75 pack-year smoking history. She has never used smokeless tobacco. She reports that she does not drink alcohol or use illicit drugs. Where does patient live home. Can patient participate in ADLs? Yes.  Allergies  Allergen Reactions  . Ceftriaxone Anaphylaxis and Other (See Comments)    *ROCEPHIN*  "Blow up like a balloon"  . Hydroxyzine Shortness Of Breath and Other (See Comments)    Pt states med make her light headed, get sob sxs  . Lexapro [Escitalopram] Other (See Comments)    Pt states med make her dizzy    Family History:  Family History  Problem Relation Age of Onset  . Schizophrenia Sister   . Alzheimer's disease Mother   . Hyperlipidemia  Brother   . Heart disease Sister   . Diabetes Sister   . Cancer Neg Hx   . Stroke Neg Hx   . COPD Neg Hx   . Depression Neg Hx   . Drug abuse Neg Hx   . Early death Neg Hx   . Hypertension Neg Hx   . Kidney disease Neg Hx       Prior to Admission medications   Medication Sig Start Date End Date Taking? Authorizing Provider  albuterol (PROVENTIL HFA;VENTOLIN HFA) 108 (90 BASE) MCG/ACT inhaler Inhale 2 puffs into the lungs every 6 (six) hours as needed for wheezing or shortness of breath. 09/01/15  Yes [name], MD  ALPRAZolam Duanne Moron) 1 MG tablet TAKE 1 TABLET THREE TIMES A DAY AS NEEDED FOR SLEEP OR ANXIETY 08/15/15  Yes [name], MD  aspirin 325 MG EC tablet Take 325 mg by mouth every morning.    Yes Historical Provider, MD  aspirin-acetaminophen-caffeine (EXCEDRIN MIGRAINE) [phone] MG per tablet Take 2 tablets by mouth daily as needed for headache.   Yes Historical Provider, MD  butalbital-acetaminophen-caffeine (FIORICET) [phone] MG per tablet Take 1-2 pills as needed for headache 04/28/15  Yes [name], MD  calcium-vitamin D (OSCAL WITH D) 250-125 MG-UNIT per tablet Take 1 tablet by mouth every morning.    Yes Historical Provider, MD  COMBIVENT RESPIMAT 20-100 MCG/ACT AERS respimat INHALE 1 PUFF INTO THE LUNGS EVERY 6 (SIX) HOURS. 08/25/15  Yes Juanito Doom, MD  diclofenac sodium (VOLTAREN) 1 % GEL Apply 4 g topically daily as needed (apply to hands). Hand pain 08/04/15  Yes [name], MD  furosemide (LASIX) 40 MG tablet Take 1 tablet (40 mg total) by mouth 2 (two) times daily. 07/25/15  Yes [name], MD  ipratropium-albuterol (DUONEB) 0.5-2.5 (3) MG/3ML SOLN Take 3 mLs by nebulization 2 (two) times daily as needed. DX: J44.9 Patient taking differently: Take 3 mLs by nebulization 2 (two) times daily as needed (for shortness of breath). DX: J44.9 05/07/15  Yes Juanito Doom, MD  KLOR-CON M20 20 MEQ tablet TAKE 1 TABLET EVERY DAY 09/15/14  Yes [name], MD  lactulose, encephalopathy, (CHRONULAC) 10 GM/15ML SOLN Take 30 mLs (20 g total) by mouth 3 (three) times daily as needed (Constipation). 08/26/15  Yes [name], MD  montelukast (SINGULAIR) 10 MG tablet Take 1 tablet (10 mg total) by mouth at bedtime. 09/01/15  Yes [name], MD  Multiple Vitamins-Minerals (MULTIVITAMIN WITH MINERALS) tablet Take 1 tablet by mouth every morning.    Yes Historical Provider, MD  nortriptyline (PAMELOR) 25 MG capsule Take 2 capsules (50 mg total) by mouth at bedtime. 07/26/15  Yes [name], MD  Omega 3 1200 MG CAPS Take 1,200 mg by mouth every morning.    Yes Historical Provider, MD  ondansetron (ZOFRAN) 8 MG tablet Take 1 tablet (8 mg total) by mouth 2 (two) times daily as  needed for nausea or vomiting. 04/10/14  Yes [name], MD  OXYGEN-HELIUM IN Inhale 3 L into the lungs See admin instructions. Uses when needed during the day, uses continuous throughout the night   Yes Historical Provider, MD  pantoprazole (PROTONIX) 40 MG tablet Take 1 tablet (40 mg total) by mouth every morning. 04/03/15  Yes [name], MD  Pitavastatin Calcium (LIVALO) 2 MG TABS Take 1 tablet (2 mg total) by mouth daily. 08/12/15  Yes Pixie Casino, MD  Pseudoeph-Doxylamine-DM-APAP (NYQUIL PO) Take 30 mLs by mouth at bedtime as needed (cold symptoms).   Yes Historical Provider, MD  SYMBICORT 160-4.5 MCG/ACT inhaler INHALE 2 PUFFS INTO THE LUNGS 2 (TWO) TIMES DAILY. 12/18/14  Yes [name], MD    Physical Exam: Filed Vitals:   09/04/15 0030 09/04/15 0045 09/04/15 0215 09/04/15 0300  BP: 124/65 137/67 133/121 103/47  Pulse: 70 68 60 67  Resp: 17 18 17 16   SpO2: 99% 99% 79% 100%     General:  Moderately built and nourished.  Eyes: Anicteric no pallor.  ENT: No discharge from the ears eyes nose and mouth.  Neck: No mass felt.  Cardiovascular: S1 and S2 heard.  Respiratory: No rhonchi or crepitations.  Abdomen: Soft nontender bowel sounds  present. (Patient has received pain relief medications by the time I examined).  Skin: No rash.  Musculoskeletal: No edema.  Psychiatric: Appears normal.  Neurologic: Alert awake oriented to time place and person. Moves all extremities.  Labs on Admission:  Basic Metabolic Panel:  Recent Labs Lab 09/03/15 2333  NA 140  K 3.9  CL 97*  CO2 34*  GLUCOSE 135*  BUN 16  CREATININE 1.04*  CALCIUM 9.9   Liver Function Tests:  Recent Labs Lab 09/03/15 2333  AST 334*  ALT 250*  ALKPHOS 756*  BILITOT 0.9  PROT 8.0  ALBUMIN 3.6    Recent Labs Lab 09/03/15 2333  LIPASE 132*   No results for input(s): AMMONIA in the last 168 hours. CBC:  Recent Labs Lab 09/03/15 2333  WBC 10.0  HGB 12.8  HCT 39.0  MCV 96.3  PLT 280   Cardiac Enzymes: No results for input(s): CKTOTAL, CKMB, CKMBINDEX, TROPONINI in the last 168 hours.  BNP (last 3 results)  Recent Labs  09/03/15 2333  BNP 253.4*    ProBNP (last 3 results) No results for input(s): PROBNP in the last 8760 hours.  CBG: No results for input(s): GLUCAP in the last 168 hours.  Radiological Exams on Admission: Dg Chest 2 View  09/03/2015  CLINICAL DATA:  69 year old female with abdominal pain and chest pain EXAM: CHEST  2 VIEW COMPARISON:  Chest radiograph dated 07/18/2014 and abdominal CT dated 01/01/2015 FINDINGS: Two views of the chest demonstrate emphysematous changes of the lungs with mild diffuse interstitial prominence. There is blunting of the costophrenic angles which may represent small pleural effusions. No focal consolidation or pneumothorax. Stable cardiac silhouette. The osseous structures appear unremarkable. Right axillary surgical clips noted. IMPRESSION: Mild diffuse interstitial prominence.  No focal consolidation. Electronically Signed   By: Anner Crete M.D.   On: 09/03/2015 23:44   Ct Abdomen Pelvis W Contrast  09/04/2015  CLINICAL DATA:  Acute onset of upper abdominal pain, radiating to  the back. Elevated lipase. Initial encounter. EXAM: CT ABDOMEN AND PELVIS WITH CONTRAST TECHNIQUE: Multidetector CT imaging of the abdomen and pelvis was performed using the standard protocol following bolus administration of intravenous contrast. CONTRAST:  19mL OMNIPAQUE  IOHEXOL 300 MG/ML  SOLN COMPARISON:  CT of the abdomen and pelvis from 01/30/2012, and right upper quadrant ultrasound performed earlier today at 1:18 a.m. FINDINGS: The visualized lung bases are clear. Scattered coronary artery calcifications are seen. There is diffuse prominence of the intrahepatic biliary ducts, with prominence of the common bile duct to 1.4 cm. Surrounding soft tissue inflammation is noted. The patient is status post cholecystectomy, with clips noted along the gallbladder fossa. A 7 mm stone is noted at the distal common bile duct, stable from 2013. Though this may still be within normal limits, the mild soft tissue edema about the porta hepatis raises question for cholangitis. The pancreas and adrenal glands are unremarkable. The kidneys are unremarkable in appearance. There is no evidence of hydronephrosis. No renal or ureteral stones are seen. No perinephric stranding is appreciated. No free fluid is identified. The proximal small bowel is unremarkable in appearance. The stomach is within normal limits. No acute vascular abnormalities are seen. Scattered calcification is noted along the abdominal aorta and its branches. There is minimal ectasia of the distal abdominal aorta. A moderate anterior abdominal wall hernia is noted, containing a relatively long segment of small bowel. There is no evidence for obstruction. Postoperative change is noted along the ascending colon. The remaining colon is grossly unremarkable in appearance. The bladder is moderately distended and grossly remarkable. The uterus is unremarkable in appearance. The ovaries are grossly symmetric. No suspicious adnexal masses are seen. No inguinal  lymphadenopathy is seen. No acute osseous abnormalities are identified. Facet disease noted along the lumbar spine, with mild grade 1 anterolisthesis of L4 on L5. There is mild chronic loss of height at L1 and L3. IMPRESSION: 1. Diffuse prominence of the intrahepatic biliary ducts, with prominence of the common bile duct to 1.4 cm. This is new from 2013, with new surrounding soft tissue inflammation at the porta hepatis. This raises question for cholangitis. Would correlate for associated symptoms. 2. 7 mm stone at the distal common bile duct is stable from 2013. 3. Scattered coronary artery calcifications seen. 4. Scattered calcification along the abdominal aorta and its branches. 5. Moderate anterior abdominal wall hernia, containing a relatively long segment of small bowel. No evidence for obstruction. Electronically Signed   By: Garald Balding M.D.   On: 09/04/2015 02:40   US Abdomen Limited Ruq  09/04/2015  CLINICAL DATA:  Acute onset of generalized abdominal pain. Elevated LFTs and elevated lipase. Initial encounter. EXAM: US ABDOMEN LIMITED - RIGHT UPPER QUADRANT COMPARISON:  CT of the abdomen and pelvis performed 01/30/2012, and abdominal ultrasound performed 08/01/2011 FINDINGS: Gallbladder: Status post cholecystectomy.  No retained stones seen. Common bile duct: Diameter: 1.1 cm, within normal limits status post cholecystectomy. Liver: No focal lesion identified. Within normal limits in parenchymal echogenicity. IMPRESSION: Unremarkable ultrasound of the right upper quadrant. Status post cholecystectomy. Electronically Signed   By: Garald Balding M.D.   On: 09/04/2015 01:43    EKG: Independently reviewed. Normal sinus rhythm with nonspecific ST changes.  Assessment/Plan Principal Problem:   Acute cholangitis Active Problems:   COPD (chronic obstructive pulmonary disease) (HCC)   CAD (coronary artery disease), native coronary artery   1. Acute cholangitis/possible gallstone pancreatitis - at  this time patient has been kept nothing by mouth and I have placed patient on Primaxin antibiotics for possible acute cholangitis. Patient's CAT scan does show features concerning for cholangitis with CBD stone. Continue with hydration. Follow blood cultures. Gastroenterologist will need to be consulted in a.m.  2. Acute renal failure probably from dehydration - continue with gentle hydration closely follow metabolic panel and intake output. 3. COPD present in all wheezing - continue home inhalers. 4. CAD status post stenting - denies any chest pain. We will cycle cardiac markers. 5. Hyperlipidemia on statins. 6. History of pulmonary hypertension - presently receiving IV fluids.  I have reviewed patient's old charts and labs.   DVT Prophylaxis SCDs in anticipation of procedure.  Code Status: Full code.  Family Communication: Discussed with patient.  Disposition Plan: Admit to inpatient.    Kandi Brusseau N. [facility] Pager [phone].  If 7PM-7AM, please contact night-coverage [email or web address] Password [email or web address] 09/04/2015, 4:15 AM

## 2015-09-04 NOTE — Consult Note (Signed)
Consultation  Referring Provider: [facility] hospitalist - Dr Wendee Beavers Primary Care Physician:  Scarlette Calico, MD Primary Gastroenterologist:  unassigned  Reason for Consultation:   Acute abdominal pain/elevated LFT's  HPI: [name] is a 69 y.o. female who was admitted through the emergency room last night after onset of acute severe upper abdominal pain about 7 PM.  She says the pain was constant and radiated around bilaterally into her back. She did not have any associated nausea vomiting, fever or chills or diarrhea. When the pain would not subside she presented to the emergency room.  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 She is status post cholecystectomy in 2011. Patient states she feels much better this morning has not had any pain medicine since the emergency room and is not having any abdominal pain. She denies nausea. She has not had any similar episodes of pain. Workup through the emergency room showed a mildly elevated lipase at 76 and elevated LFTs, normal WBC.  She had CT of the abdomen and pelvis done which showed the common bile duct to be 1.4 cm there was some diffuse prominence of the intrahepatics and some surrounding soft tissue inflammation of the CBD. Probable 7 mm stone in the distal common bile duct seen also noted in 2013. She has no abdominal wall hernia containing small bowel.   Past Medical History  Diagnosis Date  . Angina   . Heart murmur   . Depressed   . Coronary artery disease 2006    2 stents w/previous MI  . Hypertension   . Migraine   . Anxiety   . Myocardial infarction [facility]) 2006    NSTEMI  . COPD (chronic  obstructive pulmonary disease) (HCC)     oxygen-dependent 4LPM [place]  . Moderate to severe pulmonary hypertension ([place])   . Diastolic CHF ([place])   . Cancer of breast ([place]) dx'd 2008    RT Breast  . Aortic stenosis     mild  . Dyslipidemia   . NSVT (nonsustained ventricular tachycardia) (HCC)     h/o  . History of nuclear stress test 08/03/2011    attenuation at apex - no perfusion defects   . Abnormal LFTs 08/02/2011  . Acute liver failure 06/19/2013  . Acute renal failure ([place]) 06/19/2013  . Acute respiratory failure ([place]) 06/19/2013  . Altered mental status 08/01/2011  . Anxiety state, unspecified 12/03/2013  . Breast cancer of upper-outer quadrant of left female breast ([place]) 08/21/2013  . CAD (coronary artery disease) of artery bypass graft 11/06/2013  . CAD (coronary artery disease), MI R/O 08/01/2011    PCI in 2006 (bare metal stent, unknown artery) - [place]   . CHF,  acute diastolic, BNP 4k on admissio 08/01/2011  . Chronic respiratory failure ([place]) 02/02/2012  . Compression fracture of L1 lumbar vertebra ([place]) 02/02/2012  . Mild aortic stenosis 08/01/2011    AVA 1.69 cm2 (06/02/2011)   . Hyponatremia 01/31/2012  . Hyperkalemia, on ACE prior to admission 11/11/2011  . HTN (hypertension) 11/06/2013  . History of breast cancer in female 06/23/2013  . 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  Breast lumpectomy Right 2008  . Cardiac catheterization  2006  . Cholecystectomy  2011  . Abdominal surgery  2009  . Transthoracic echocardiogram  11/12/2011    EF 0000000, normal systolic function, grade 1 diastolic dysfunction; ventricular septal flattening (D-sign); mild AS; trace-mild MR; LA mildly dilated; RV mod dilated; RA mod dilated; severe pulm HTN; elevated CVP  . Right heart catheterization N/A 09/05/2013    Procedure: RIGHT HEART CATH;  Surgeon: Jolaine Artist, MD;  Location: [facility] CATH LAB;  Service: Cardiovascular;  Laterality: N/A;    Prior to  Admission medications   Medication Sig Start Date End Date Taking? Authorizing Provider  albuterol (PROVENTIL HFA;VENTOLIN HFA) 108 (90 BASE) MCG/ACT inhaler Inhale 2 puffs into the lungs every 6 (six) hours as needed for wheezing or shortness of breath. 09/01/15  Yes [name], MD  ALPRAZolam Duanne Moron) 1 MG tablet TAKE 1 TABLET THREE TIMES A DAY AS NEEDED FOR SLEEP OR ANXIETY 08/15/15  Yes [name], MD  aspirin 325 MG EC tablet Take 325 mg by mouth every morning.    Yes Historical Provider, MD  aspirin-acetaminophen-caffeine (EXCEDRIN MIGRAINE) [phone] MG per tablet Take 2 tablets by mouth daily as needed for headache.   Yes Historical Provider, MD  butalbital-acetaminophen-caffeine (FIORICET) [phone] MG per tablet Take 1-2 pills as needed for headache 04/28/15  Yes [name], MD  calcium-vitamin D (OSCAL WITH D) 250-125 MG-UNIT per tablet Take 1 tablet by mouth every morning.    Yes Historical Provider, MD  COMBIVENT RESPIMAT 20-100 MCG/ACT AERS respimat INHALE 1 PUFF INTO THE LUNGS EVERY 6 (SIX) HOURS. 08/25/15  Yes Juanito Doom, MD  diclofenac sodium (VOLTAREN) 1 % GEL Apply 4 g topically daily as needed (apply to hands). Hand pain 08/04/15  Yes [name], MD  furosemide (LASIX) 40 MG tablet Take 1 tablet (40 mg total) by mouth 2 (two) times daily. 07/25/15  Yes [name], MD  ipratropium-albuterol (DUONEB) 0.5-2.5 (3) MG/3ML SOLN Take 3 mLs by nebulization 2 (two) times daily as needed. DX: J44.9 Patient taking differently: Take 3 mLs by nebulization 2 (two) times daily as needed (for shortness of breath). DX: J44.9 05/07/15  Yes Juanito Doom, MD  KLOR-CON M20 20 MEQ tablet TAKE 1 TABLET EVERY DAY 09/15/14  Yes [name], MD  lactulose, encephalopathy, (CHRONULAC) 10 GM/15ML SOLN Take 30 mLs (20 g total) by mouth 3 (three) times daily as needed (Constipation). 08/26/15  Yes [name], MD  montelukast (SINGULAIR) 10 MG tablet Take 1 tablet (10 mg total)  by mouth at bedtime. 09/01/15  Yes [name], MD  Multiple Vitamins-Minerals (MULTIVITAMIN WITH MINERALS) tablet Take 1 tablet by mouth every morning.    Yes Historical Provider, MD  nortriptyline (PAMELOR) 25 MG capsule Take 2 capsules (50 mg total) by mouth at bedtime. 07/26/15  Yes [name], MD  Omega 3 1200 MG CAPS Take 1,200 mg by mouth every morning.    Yes Historical Provider, MD  ondansetron (ZOFRAN) 8 MG tablet Take 1 tablet (8 mg total) by mouth 2 (two) times daily as needed for nausea or vomiting. 04/10/14  Yes [name], MD  OXYGEN-HELIUM IN Inhale 3 L into the lungs See admin instructions. Uses when needed during the day, uses continuous throughout the night   Yes Historical Provider, MD  pantoprazole (PROTONIX) 40 MG tablet Take 1 tablet (40 mg total) by mouth every morning. 04/03/15  Yes [name],  MD  Pitavastatin Calcium (LIVALO) 2 MG TABS Take 1 tablet (2 mg total) by mouth daily. 08/12/15  Yes Pixie Casino, MD  Pseudoeph-Doxylamine-DM-APAP (NYQUIL PO) Take 30 mLs by mouth at bedtime as needed (cold symptoms).   Yes Historical Provider, MD  SYMBICORT 160-4.5 MCG/ACT inhaler INHALE 2 PUFFS INTO THE LUNGS 2 (TWO) TIMES DAILY. 12/18/14  Yes [name], MD    Current Facility-Administered Medications  Medication Dose Route Frequency Provider Last Rate Last Dose  . 0.9 %  sodium chloride infusion   Intravenous Continuous Rise Patience, MD 125 mL/hr at 09/04/15 [phone]    . acetaminophen (TYLENOL) tablet 650 mg  650 mg Oral Q6H PRN Rise Patience, MD       Or  . acetaminophen (TYLENOL) suppository 650 mg  650 mg Rectal Q6H PRN Rise Patience, MD      . albuterol (PROVENTIL) (2.5 MG/3ML) 0.083% nebulizer solution 2.5 mg  2.5 mg Nebulization Q2H PRN Rise Patience, MD      . budesonide-formoterol (SYMBICORT) 160-4.5 MCG/ACT inhaler 2 puff  2 puff Inhalation BID Rise Patience, MD   2 puff at 09/04/15 0944  . imipenem-cilastatin (PRIMAXIN)  250 mg in sodium chloride 0.9 % 100 mL IVPB  250 mg Intravenous 4 times per day Rise Patience, MD   250 mg at 09/04/15 0727  . ipratropium-albuterol (DUONEB) 0.5-2.5 (3) MG/3ML nebulizer solution 3 mL  3 mL Nebulization BID Velvet Bathe, MD      . morphine 2 MG/ML injection 1 mg  1 mg Intravenous Q4H PRN Rise Patience, MD      . ondansetron [facility]) tablet 4 mg  4 mg Oral Q6H PRN Rise Patience, MD       Or  . ondansetron [facility]) injection 4 mg  4 mg Intravenous Q6H PRN Rise Patience, MD        Allergies as of 09/03/2015 - Review Complete 08/19/2015  Allergen Reaction Noted  . Hydroxyzine Shortness Of Breath and Other (See Comments) 12/11/2013  . Ceftriaxone Other (See Comments) 08/01/2011  . Lexapro [escitalopram] Other (See Comments) 12/11/2013    Family History  Problem Relation Age of Onset  . Schizophrenia Sister   . Alzheimer's disease Mother   . Hyperlipidemia Brother   . Heart disease Sister   . Diabetes Sister   . Cancer Neg Hx   . Stroke Neg Hx   . COPD Neg Hx   . Depression Neg Hx   . Drug abuse Neg Hx   . Early death Neg Hx   . Hypertension Neg Hx   . Kidney disease Neg Hx     Social History   Social History  . Marital Status: Widowed    Spouse Name: N/A  . Number of Children: 3  . Years of Education: 12   Occupational History  . Not on file.   Social History Main Topics  . Smoking status: Current Some Day Smoker -- 0.25 packs/day for 47 years    Types: Cigarettes  . Smokeless tobacco: Never Used     Comment: smokes when anxious, uses eCigs as well.   . Alcohol Use: No     Comment: wine with dinner occasionally  . Drug Use: No  . Sexual Activity: Not Currently     Comment: 3 cig a day   Other Topics Concern  . Not on file   Social History Narrative    Review of Systems: Pertinent positive and negative review  of systems were noted in the above HPI section.  All other review of systems was otherwise negative.  Physical  Exam: Vital signs in last 24 hours: Temp:  [98.7 F (37.1 C)] 98.7 F (37.1 C) (12/08 0558) Pulse Rate:  [52-70] 66 (12/08 0558) Resp:  [16-20] 19 (12/08 0558) BP: (103-139)/(47-121) 114/58 mmHg (12/08 0558) SpO2:  [79 %-100 %] 97 % (12/08 0952) Weight:  [122 lb 5.7 oz (55.5 kg)] 122 lb 5.7 oz (55.5 kg) (12/08 0558) Last BM Date: 09/03/15 General:   Alert,older WF on oxygen  Well-developed, well-nourished, pleasant and cooperative in NAD Head:  Normocephalic and atraumatic. Eyes:  Sclera ?early icterus   Conjunctiva pink. Ears:  Normal auditory acuity. Nose:  No deformity, discharge,  or lesions. Mouth:  No deformity or lesions.   Neck:  Supple; no masses or thyromegaly. Lungs:  Clear throughout to auscultation.  Decreased BS . Heart:  Regular rate and rhythm; no murmurs, clicks, rubs,  or gallops. Abdomen:  Soft,nontender, BS active,nonpalp mass or hsm, multiple incisional scars.   Rectal:  Deferred  Msk:  Symmetrical without gross deformities. . Pulses:  Normal pulses noted. Extremities:  Without clubbing or edema. Neurologic:  Alert and  oriented x4;  grossly normal neurologically. Skin:  Intact without significant lesions or rashes.. Psych:  Alert and cooperative. Normal mood and affect.  Intake/Output from previous day:   Intake/Output this shift:    Lab Results:  Recent Labs  09/03/15 2333 09/04/15 0634  WBC 10.0 8.5  HGB 12.8 11.6*  HCT 39.0 36.4  PLT 280 251   BMET  Recent Labs  09/03/15 2333 09/04/15 0634  NA 140 135  K 3.9 3.8  CL 97* 96*  CO2 34* 31  GLUCOSE 135* 100*  BUN 16 14  CREATININE 1.04* 0.77  CALCIUM 9.9 9.3   LFT  Recent Labs  09/04/15 0634  PROT 7.5  ALBUMIN 3.1*  AST 251*  ALT 226*  ALKPHOS 603*  BILITOT 1.6*  BILIDIR 1.0*  IBILI 0.6   PT/INR No results for input(s): LABPROT, INR in the last 72 hours. Hepatitis Panel No results for input(s): HEPBSAG, HCVAB, HEPAIGM, HEPBIGM in the last 72 hours.   IMPRESSION:   #1  69 yo female with acute severe upper abdominal pain onset last evening with elevated LFTs- workup concerning for choledocholithiasis/? early cholangitis by CT but no fever or elevated WBC Pt is pain free today -?passed stone  #2 severe COPD- O2 dependent #3 CAD- s/p MI, stents 2006 #4 pulmonary hypertension #5 hx breast CA #6 s/p cholecystectomy 2011 #7 hx bowel infarction - s/p resection #8 HTN #9 anxiety  PLAN: #1  Will obtain MRCP, then decide if ERCP indicated. She is high risk for procedures, anesthesia given severe COPD, pulmonary hypertension, CAD. Discussed in detail  with pt and she is agreeable. #2 clear liquids #3 serial LFT's  #4 Continue Primaxin    Amy Esterwood  09/04/2015, 10:06 AM     Attending physician's note   I have taken an interval history, reviewed the chart and examined the patient. I agree with the Advanced Practitioner's note, impression and recommendations. Acute upper abd pain and elevated LFTs in post cholecystectomy patient. CT showing dilated CBD, distal CBD stone and peri porta hepatis inflammatory changes. Choledocholithiasis however as abd pain has completely resolved today will confirm that CBD stone has not passed with MRCP as she is at high risk for ERCP and anesthesia given comorbidities. Continue Primaxin. Trend CMP, CBC.  Lucio Edward, MD [facility] [phone] Mon-Fri 8a-5p [phone] after 5p, weekends, holidays

## 2015-09-04 NOTE — Progress Notes (Signed)
CRITICAL VALUE ALERT  Critical value received:  CBG 68  Date of notification: 09/04/15  Time of notification: 2055  Critical value read back:yes  Nurse who received alert:  Dellie Catholic  MD notified (1st page):  yes  Time of first page: 2056

## 2015-09-04 NOTE — Progress Notes (Signed)
ANTIBIOTIC CONSULT NOTE - INITIAL  Pharmacy Consult for Primaxin  Indication: Intra-abdominal infection  Allergies  Allergen Reactions  . Ceftriaxone Anaphylaxis and Other (See Comments)    *ROCEPHIN*  "Blow up like a balloon"  . Hydroxyzine Shortness Of Breath and Other (See Comments)    Pt states med make her light headed, get sob sxs  . Lexapro [Escitalopram] Other (See Comments)    Pt states med make her dizzy    Patient Measurements: Height: 5\' 2"  (157.5 cm) Weight: 122 lb 5.7 oz (55.5 kg) IBW/kg (Calculated) : 50.1 Adjusted Body Weight:   Vital Signs: Temp: 98.7 F (37.1 C) (12/08 0558) Temp Source: Oral (12/08 0558) BP: 114/58 mmHg (12/08 0558) Pulse Rate: 66 (12/08 0558) Intake/Output from previous day:   Intake/Output from this shift:    Labs:  Recent Labs  09/03/15 2333 09/04/15 0634  WBC 10.0 8.5  HGB 12.8 11.6*  PLT 280 251  CREATININE 1.04*  --    Estimated Creatinine Clearance: 40.4 mL/min (by C-G formula based on Cr of 1.04). No results for input(s): VANCOTROUGH, VANCOPEAK, VANCORANDOM, GENTTROUGH, GENTPEAK, GENTRANDOM, TOBRATROUGH, TOBRAPEAK, TOBRARND, AMIKACINPEAK, AMIKACINTROU, AMIKACIN in the last 72 hours.   Microbiology: No results found for this or any previous visit (from the past 720 hour(s)).  Medical History: Past Medical History  Diagnosis Date  . Angina   . Heart murmur   . Depressed   . Coronary artery disease 2006    2 stents w/previous MI  . Hypertension   . Migraine   . Anxiety   . Myocardial infarction [facility]) 2006    NSTEMI  . COPD (chronic obstructive pulmonary disease) (HCC)     oxygen-dependent 4LPM   . Moderate to severe pulmonary hypertension ([place])   . Diastolic CHF ([place])   . Cancer of breast ([place]) dx'd 2008    RT Breast  . Aortic stenosis     mild  . Dyslipidemia   . NSVT (nonsustained ventricular tachycardia) (HCC)     h/o  . History of nuclear stress test 08/03/2011    attenuation at apex - no  perfusion defects   . Abnormal LFTs 08/02/2011  . Acute liver failure 06/19/2013  . Acute renal failure ([place]) 06/19/2013  . Acute respiratory failure ([place]) 06/19/2013  . Altered mental status 08/01/2011  . Anxiety state, unspecified 12/03/2013  . Breast cancer of upper-outer quadrant of left female breast ([place]) 08/21/2013  . CAD (coronary artery disease) of artery bypass graft 11/06/2013  . CAD (coronary artery disease), MI R/O 08/01/2011    PCI in 2006 (bare metal stent, unknown artery) - [place]   . CHF,  acute diastolic, BNP 4k on admissio 08/01/2011  . Chronic respiratory failure ([place]) 02/02/2012  . Compression fracture of L1 lumbar vertebra ([place]) 02/02/2012  . Mild aortic stenosis 08/01/2011    AVA 1.69 cm2 (06/02/2011)   . Hyponatremia 01/31/2012  . Hyperkalemia, on ACE prior to admission 11/11/2011  . HTN (hypertension) 11/06/2013  . History of breast cancer in female 06/23/2013  . 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    Goal of Therapy:  Primaxin dosed based on patient weight and renal function   Plan:  Follow up culture results  Primaxin 250mg  iv q6hr  Tyler Deis, Leota Maka Crowford 09/04/2015,6:44 AM

## 2015-09-04 NOTE — Progress Notes (Signed)
Patient seen and evaluated earlier this AM by my associate. Please refer to H and P for details regarding assessment and plan.  Consulted GI to assist with further evaluation and recommendations regarding Acute cholangitis.  Gen: Pt in nad, alert and awake CV: s1 and s2 wnl, no rubs Pulm: no increased wob, no wheezes GI: no guarding, ND  Will reassess next am. Unless there is an acute medical change in condition requiring reassessment.  [name], [employer]

## 2015-09-05 ENCOUNTER — Inpatient Hospital Stay (HOSPITAL_COMMUNITY): Payer: Medicare Other | Admitting: Anesthesiology

## 2015-09-05 ENCOUNTER — Encounter (HOSPITAL_COMMUNITY): Payer: Self-pay

## 2015-09-05 ENCOUNTER — Inpatient Hospital Stay (HOSPITAL_COMMUNITY): Payer: Medicare Other

## 2015-09-05 ENCOUNTER — Encounter (HOSPITAL_COMMUNITY)
Admission: EM | Disposition: A | Discharge status: Home / Self Care | Payer: Self-pay | Source: Home / Self Care | Attending: Family Medicine

## 2015-09-05 DIAGNOSIS — R945 Abnormal results of liver function studies: Secondary | ICD-10-CM

## 2015-09-05 DIAGNOSIS — K8050 Calculus of bile duct without cholangitis or cholecystitis without obstruction: Secondary | ICD-10-CM | POA: Insufficient documentation

## 2015-09-05 DIAGNOSIS — R7989 Other specified abnormal findings of blood chemistry: Secondary | ICD-10-CM | POA: Insufficient documentation

## 2015-09-05 DIAGNOSIS — R1011 Right upper quadrant pain: Secondary | ICD-10-CM

## 2015-09-05 HISTORY — PX: ERCP: SHX5425

## 2015-09-05 LAB — GLUCOSE, CAPILLARY
Glucose-Capillary: 107 mg/dL — ABNORMAL HIGH (ref 65–99)
Glucose-Capillary: 110 mg/dL — ABNORMAL HIGH (ref 65–99)
Glucose-Capillary: 113 mg/dL — ABNORMAL HIGH (ref 65–99)
Glucose-Capillary: 123 mg/dL — ABNORMAL HIGH (ref 65–99)
Glucose-Capillary: 76 mg/dL (ref 65–99)
Glucose-Capillary: 77 mg/dL (ref 65–99)
Glucose-Capillary: 92 mg/dL (ref 65–99)

## 2015-09-05 SURGERY — ERCP, WITH INTERVENTION IF INDICATED
Anesthesia: General

## 2015-09-05 MED ORDER — INDOMETHACIN 50 MG RE SUPP
100.0000 mg | Freq: Once | RECTAL | Status: AC
  Administered 2015-09-05: 100 mg via RECTAL

## 2015-09-05 MED ORDER — GLUCAGON HCL RDNA (DIAGNOSTIC) 1 MG IJ SOLR
INTRAMUSCULAR | Status: AC
  Filled 2015-09-05: qty 2

## 2015-09-05 MED ORDER — LACTATED RINGERS IV SOLN: INTRAVENOUS | Status: DC

## 2015-09-05 MED ORDER — IOHEXOL 350 MG/ML SOLN
INTRAVENOUS | Status: DC | PRN
  Administered 2015-09-05: 10:00:00

## 2015-09-05 MED ORDER — KETOROLAC TROMETHAMINE 30 MG/ML IJ SOLN
30.0000 mg | Freq: Once | INTRAMUSCULAR | Status: AC
  Administered 2015-09-05: 30 mg via INTRAVENOUS
  Filled 2015-09-05: qty 1

## 2015-09-05 MED ORDER — GLUCAGON HCL RDNA (DIAGNOSTIC) 1 MG IJ SOLR
INTRAMUSCULAR | Status: DC | PRN
  Administered 2015-09-05 (×2): 0.25 mg via INTRAVENOUS

## 2015-09-05 MED ORDER — PROPOFOL 10 MG/ML IV BOLUS
INTRAVENOUS | Status: AC
  Filled 2015-09-05: qty 20

## 2015-09-05 MED ORDER — PROPOFOL 10 MG/ML IV BOLUS
INTRAVENOUS | Status: DC | PRN
  Administered 2015-09-05: 100 mg via INTRAVENOUS

## 2015-09-05 MED ORDER — SUGAMMADEX SODIUM 200 MG/2ML IV SOLN
INTRAVENOUS | Status: AC
  Filled 2015-09-05: qty 2

## 2015-09-05 MED ORDER — LACTATED RINGERS IV SOLN
INTRAVENOUS | Status: DC | PRN
  Administered 2015-09-05: 09:00:00 via INTRAVENOUS

## 2015-09-05 MED ORDER — INDOMETHACIN 50 MG RE SUPP
RECTAL | Status: AC
  Filled 2015-09-05: qty 2

## 2015-09-05 MED ORDER — DIPHENHYDRAMINE HCL 50 MG/ML IJ SOLN
12.5000 mg | Freq: Once | INTRAMUSCULAR | Status: AC
  Administered 2015-09-05: 12.5 mg via INTRAVENOUS
  Filled 2015-09-05: qty 1

## 2015-09-05 MED ORDER — ROCURONIUM BROMIDE 100 MG/10ML IV SOLN
INTRAVENOUS | Status: DC | PRN
  Administered 2015-09-05: 30 mg via INTRAVENOUS

## 2015-09-05 MED ORDER — LEVALBUTEROL HCL 0.63 MG/3ML IN NEBU
0.6300 mg | INHALATION_SOLUTION | Freq: Once | RESPIRATORY_TRACT | Status: AC
  Administered 2015-09-05: 0.63 mg via RESPIRATORY_TRACT
  Filled 2015-09-05: qty 3

## 2015-09-05 MED ORDER — LEVALBUTEROL HCL 0.63 MG/3ML IN NEBU
INHALATION_SOLUTION | RESPIRATORY_TRACT | Status: AC
  Administered 2015-09-05: 0.63 mg via RESPIRATORY_TRACT
  Filled 2015-09-05: qty 3

## 2015-09-05 MED ORDER — ESMOLOL HCL 100 MG/10ML IV SOLN
INTRAVENOUS | Status: DC | PRN
  Administered 2015-09-05: 10 mg via INTRAVENOUS

## 2015-09-05 MED ORDER — CIPROFLOXACIN IN D5W 400 MG/200ML IV SOLN
INTRAVENOUS | Status: AC
  Filled 2015-09-05: qty 200

## 2015-09-05 MED ORDER — CIPROFLOXACIN IN D5W 400 MG/200ML IV SOLN
400.0000 mg | Freq: Two times a day (BID) | INTRAVENOUS | Status: DC
  Administered 2015-09-05: 400 mg via INTRAVENOUS

## 2015-09-05 MED ORDER — FENTANYL CITRATE (PF) 100 MCG/2ML IJ SOLN: 25.0000 ug | INTRAMUSCULAR | Status: DC | PRN

## 2015-09-05 MED ORDER — ALPRAZOLAM 0.25 MG PO TABS
0.2500 mg | ORAL_TABLET | Freq: Three times a day (TID) | ORAL | Status: DC | PRN
  Administered 2015-09-05 – 2015-09-06 (×2): 0.25 mg via ORAL
  Filled 2015-09-05 (×2): qty 1

## 2015-09-05 MED ORDER — MEPERIDINE HCL 100 MG/ML IJ SOLN: 6.2500 mg | INTRAMUSCULAR | Status: DC | PRN

## 2015-09-05 MED ORDER — SUGAMMADEX SODIUM 200 MG/2ML IV SOLN
INTRAVENOUS | Status: DC | PRN
  Administered 2015-09-05: 120 mg via INTRAVENOUS

## 2015-09-05 MED ORDER — PROMETHAZINE HCL 25 MG/ML IJ SOLN: 6.2500 mg | INTRAMUSCULAR | Status: DC | PRN

## 2015-09-05 MED ORDER — SUCCINYLCHOLINE CHLORIDE 20 MG/ML IJ SOLN
INTRAMUSCULAR | Status: DC | PRN
  Administered 2015-09-05: 80 mg via INTRAVENOUS

## 2015-09-05 MED ORDER — FENTANYL CITRATE (PF) 100 MCG/2ML IJ SOLN
INTRAMUSCULAR | Status: AC
  Filled 2015-09-05: qty 2

## 2015-09-05 NOTE — Op Note (Signed)
[facility] [place], [address]   ENDOSOCOPY REPORT - 09/05/2015  name:  Arlyss, Chrisp #:  [id number] dob:  25-Jun-1946     mr #:  female g.i. attending:  Ladene Artist, MD, [facility] assistant:  Michelle Piper  procedure:     1.  ERCP with sphincterotomy/papillotomy  2.  ERCP with removal of calculus/calculi indications:     abnormal liver function test , abnormal MRCP, and bile duct stone on CT scan. medications:     Per Anesthesia topical anesthetic:     none  description of procedure: The risks benefits and alternatives of the procedure were thoroughly explained.  The patient was informed of possible complications, which included but was not limited to pancreatitis, bleeding, perforation, infection, drug reaction, possible surgery for any complications and remote chance of life threatening complications. Patient's questions were answered and informed consent obtained. The duodenoscope endoscope was introduced through the mouth and advanced to the second portion of the duodenum.    findings:  The ampulla was located the second portion of the duodenum.  The ampulla appeared normal.  The CBD was freely cannulated and then a guidewire was advanced and contrast injected. There was diffuse dilation of the intraheptic ducts, common hepatic duct, and CBD.   A single 8 mm stone was seen in the common bile duct.   With guidewire in the bile duct, a biliary sphincterotomy was performed using the sphincterotome.   Using a stone extraction balloon the bile duct was swept twice.  A single stone was removed from the bile duct successfully. Excellent biliary drainage post stone extraction. The PD was not cannulated or injected by intention. The scope was then completely withdrawn from the patient and the procedure terminated.  specimen(s) taken:  [ ]  Yes     [x]  No estimated blood loss:  [x]  None     [ ]  < 5cc      [ ]  >  5cc complications:  There were no complications. patient condition:  stable  impression:     1.  Normal appearing ampulla; biliary sphincterotomy performed 2.  Diffuse biliary tree dilation post cholecystectomy 3.  A single stone removed from the bile duct using balloon extraction  recommendations:     1.  Trend liver enzymes 2.  Observation post ERCP   Ladene Artist, MD, [facility]

## 2015-09-05 NOTE — Anesthesia Postprocedure Evaluation (Signed)
Anesthesia Post Note  Patient: [name]  Procedure(s) Performed: Procedure(s) (LRB): ENDOSCOPIC RETROGRADE CHOLANGIOPANCREATOGRAPHY (ERCP) (N/A)  Patient location during evaluation: PACU Anesthesia Type: General Level of consciousness: awake and alert Pain management: pain level controlled Vital Signs Assessment: post-procedure vital signs reviewed and stable Respiratory status: spontaneous breathing, nonlabored ventilation, respiratory function stable and patient connected to nasal cannula oxygen Cardiovascular status: blood pressure returned to baseline and stable Postop Assessment: no signs of nausea or vomiting Anesthetic complications: no    Last Vitals:  Filed Vitals:   09/05/15 1100 09/05/15 1111  BP: 123/76 128/77  Pulse: 82 87  Temp:  36.4 C  Resp: 15 16    Last Pain:  Filed Vitals:   09/05/15 1115  PainSc: Asleep                 Montez Hageman

## 2015-09-05 NOTE — H&P (View-Only) (Signed)
Consultation  Referring Provider: [facility] hospitalist - Dr Wendee Beavers Primary Care Physician:  Scarlette Calico, MD Primary Gastroenterologist:  unassigned  Reason for Consultation:   Acute abdominal pain/elevated LFT's  HPI: [name] is a 69 y.o. female who was admitted through the emergency room last night after onset of acute severe upper abdominal pain about 7 PM.  She says the pain was constant and radiated around bilaterally into her back. She did not have any associated nausea vomiting, fever or chills or diarrhea. When the pain would not subside she presented to the emergency room.  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 She is status post cholecystectomy in 2011. Patient states she feels much better this morning has not had any pain medicine since the emergency room and is not having any abdominal pain. She denies nausea. She has not had any similar episodes of pain. Workup through the emergency room showed a mildly elevated lipase at 76 and elevated LFTs, normal WBC.  She had CT of the abdomen and pelvis done which showed the common bile duct to be 1.4 cm there was some diffuse prominence of the intrahepatics and some surrounding soft tissue inflammation of the CBD. Probable 7 mm stone in the distal common bile duct seen also noted in 2013. She has no abdominal wall hernia containing small bowel.   Past Medical History  Diagnosis Date  . Angina   . Heart murmur   . Depressed   . Coronary artery disease 2006    2 stents w/previous MI  . Hypertension   . Migraine   . Anxiety   . Myocardial infarction [facility]) 2006    NSTEMI  . COPD (chronic  obstructive pulmonary disease) (HCC)     oxygen-dependent 4LPM [place]  . Moderate to severe pulmonary hypertension ([place])   . Diastolic CHF ([place])   . Cancer of breast ([place]) dx'd 2008    RT Breast  . Aortic stenosis     mild  . Dyslipidemia   . NSVT (nonsustained ventricular tachycardia) (HCC)     h/o  . History of nuclear stress test 08/03/2011    attenuation at apex - no perfusion defects   . Abnormal LFTs 08/02/2011  . Acute liver failure 06/19/2013  . Acute renal failure ([place]) 06/19/2013  . Acute respiratory failure ([place]) 06/19/2013  . Altered mental status 08/01/2011  . Anxiety state, unspecified 12/03/2013  . Breast cancer of upper-outer quadrant of left female breast ([place]) 08/21/2013  . CAD (coronary artery disease) of artery bypass graft 11/06/2013  . CAD (coronary artery disease), MI R/O 08/01/2011    PCI in 2006 (bare metal stent, unknown artery) - [place]   . CHF,  acute diastolic, BNP 4k on admissio 08/01/2011  . Chronic respiratory failure ([place]) 02/02/2012  . Compression fracture of L1 lumbar vertebra ([place]) 02/02/2012  . Mild aortic stenosis 08/01/2011    AVA 1.69 cm2 (06/02/2011)   . Hyponatremia 01/31/2012  . Hyperkalemia, on ACE prior to admission 11/11/2011  . HTN (hypertension) 11/06/2013  . History of breast cancer in female 06/23/2013  . 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  Breast lumpectomy Right 2008  . Cardiac catheterization  2006  . Cholecystectomy  2011  . Abdominal surgery  2009  . Transthoracic echocardiogram  11/12/2011    EF 0000000, normal systolic function, grade 1 diastolic dysfunction; ventricular septal flattening (D-sign); mild AS; trace-mild MR; LA mildly dilated; RV mod dilated; RA mod dilated; severe pulm HTN; elevated CVP  . Right heart catheterization N/A 09/05/2013    Procedure: RIGHT HEART CATH;  Surgeon: Jolaine Artist, MD;  Location: [facility] CATH LAB;  Service: Cardiovascular;  Laterality: N/A;    Prior to  Admission medications   Medication Sig Start Date End Date Taking? Authorizing Provider  albuterol (PROVENTIL HFA;VENTOLIN HFA) 108 (90 BASE) MCG/ACT inhaler Inhale 2 puffs into the lungs every 6 (six) hours as needed for wheezing or shortness of breath. 09/01/15  Yes [name], MD  ALPRAZolam Duanne Moron) 1 MG tablet TAKE 1 TABLET THREE TIMES A DAY AS NEEDED FOR SLEEP OR ANXIETY 08/15/15  Yes [name], MD  aspirin 325 MG EC tablet Take 325 mg by mouth every morning.    Yes Historical Provider, MD  aspirin-acetaminophen-caffeine (EXCEDRIN MIGRAINE) [phone] MG per tablet Take 2 tablets by mouth daily as needed for headache.   Yes Historical Provider, MD  butalbital-acetaminophen-caffeine (FIORICET) [phone] MG per tablet Take 1-2 pills as needed for headache 04/28/15  Yes [name], MD  calcium-vitamin D (OSCAL WITH D) 250-125 MG-UNIT per tablet Take 1 tablet by mouth every morning.    Yes Historical Provider, MD  COMBIVENT RESPIMAT 20-100 MCG/ACT AERS respimat INHALE 1 PUFF INTO THE LUNGS EVERY 6 (SIX) HOURS. 08/25/15  Yes Juanito Doom, MD  diclofenac sodium (VOLTAREN) 1 % GEL Apply 4 g topically daily as needed (apply to hands). Hand pain 08/04/15  Yes [name], MD  furosemide (LASIX) 40 MG tablet Take 1 tablet (40 mg total) by mouth 2 (two) times daily. 07/25/15  Yes [name], MD  ipratropium-albuterol (DUONEB) 0.5-2.5 (3) MG/3ML SOLN Take 3 mLs by nebulization 2 (two) times daily as needed. DX: J44.9 Patient taking differently: Take 3 mLs by nebulization 2 (two) times daily as needed (for shortness of breath). DX: J44.9 05/07/15  Yes Juanito Doom, MD  KLOR-CON M20 20 MEQ tablet TAKE 1 TABLET EVERY DAY 09/15/14  Yes [name], MD  lactulose, encephalopathy, (CHRONULAC) 10 GM/15ML SOLN Take 30 mLs (20 g total) by mouth 3 (three) times daily as needed (Constipation). 08/26/15  Yes [name], MD  montelukast (SINGULAIR) 10 MG tablet Take 1 tablet (10 mg total)  by mouth at bedtime. 09/01/15  Yes [name], MD  Multiple Vitamins-Minerals (MULTIVITAMIN WITH MINERALS) tablet Take 1 tablet by mouth every morning.    Yes Historical Provider, MD  nortriptyline (PAMELOR) 25 MG capsule Take 2 capsules (50 mg total) by mouth at bedtime. 07/26/15  Yes [name], MD  Omega 3 1200 MG CAPS Take 1,200 mg by mouth every morning.    Yes Historical Provider, MD  ondansetron (ZOFRAN) 8 MG tablet Take 1 tablet (8 mg total) by mouth 2 (two) times daily as needed for nausea or vomiting. 04/10/14  Yes [name], MD  OXYGEN-HELIUM IN Inhale 3 L into the lungs See admin instructions. Uses when needed during the day, uses continuous throughout the night   Yes Historical Provider, MD  pantoprazole (PROTONIX) 40 MG tablet Take 1 tablet (40 mg total) by mouth every morning. 04/03/15  Yes [name],  MD  Pitavastatin Calcium (LIVALO) 2 MG TABS Take 1 tablet (2 mg total) by mouth daily. 08/12/15  Yes Pixie Casino, MD  Pseudoeph-Doxylamine-DM-APAP (NYQUIL PO) Take 30 mLs by mouth at bedtime as needed (cold symptoms).   Yes Historical Provider, MD  SYMBICORT 160-4.5 MCG/ACT inhaler INHALE 2 PUFFS INTO THE LUNGS 2 (TWO) TIMES DAILY. 12/18/14  Yes [name], MD    Current Facility-Administered Medications  Medication Dose Route Frequency Provider Last Rate Last Dose  . 0.9 %  sodium chloride infusion   Intravenous Continuous Rise Patience, MD 125 mL/hr at 09/04/15 [phone]    . acetaminophen (TYLENOL) tablet 650 mg  650 mg Oral Q6H PRN Rise Patience, MD       Or  . acetaminophen (TYLENOL) suppository 650 mg  650 mg Rectal Q6H PRN Rise Patience, MD      . albuterol (PROVENTIL) (2.5 MG/3ML) 0.083% nebulizer solution 2.5 mg  2.5 mg Nebulization Q2H PRN Rise Patience, MD      . budesonide-formoterol (SYMBICORT) 160-4.5 MCG/ACT inhaler 2 puff  2 puff Inhalation BID Rise Patience, MD   2 puff at 09/04/15 0944  . imipenem-cilastatin (PRIMAXIN)  250 mg in sodium chloride 0.9 % 100 mL IVPB  250 mg Intravenous 4 times per day Rise Patience, MD   250 mg at 09/04/15 0727  . ipratropium-albuterol (DUONEB) 0.5-2.5 (3) MG/3ML nebulizer solution 3 mL  3 mL Nebulization BID Velvet Bathe, MD      . morphine 2 MG/ML injection 1 mg  1 mg Intravenous Q4H PRN Rise Patience, MD      . ondansetron [facility]) tablet 4 mg  4 mg Oral Q6H PRN Rise Patience, MD       Or  . ondansetron [facility]) injection 4 mg  4 mg Intravenous Q6H PRN Rise Patience, MD        Allergies as of 09/03/2015 - Review Complete 08/19/2015  Allergen Reaction Noted  . Hydroxyzine Shortness Of Breath and Other (See Comments) 12/11/2013  . Ceftriaxone Other (See Comments) 08/01/2011  . Lexapro [escitalopram] Other (See Comments) 12/11/2013    Family History  Problem Relation Age of Onset  . Schizophrenia Sister   . Alzheimer's disease Mother   . Hyperlipidemia Brother   . Heart disease Sister   . Diabetes Sister   . Cancer Neg Hx   . Stroke Neg Hx   . COPD Neg Hx   . Depression Neg Hx   . Drug abuse Neg Hx   . Early death Neg Hx   . Hypertension Neg Hx   . Kidney disease Neg Hx     Social History   Social History  . Marital Status: Widowed    Spouse Name: N/A  . Number of Children: 3  . Years of Education: 12   Occupational History  . Not on file.   Social History Main Topics  . Smoking status: Current Some Day Smoker -- 0.25 packs/day for 47 years    Types: Cigarettes  . Smokeless tobacco: Never Used     Comment: smokes when anxious, uses eCigs as well.   . Alcohol Use: No     Comment: wine with dinner occasionally  . Drug Use: No  . Sexual Activity: Not Currently     Comment: 3 cig a day   Other Topics Concern  . Not on file   Social History Narrative    Review of Systems: Pertinent positive and negative review  of systems were noted in the above HPI section.  All other review of systems was otherwise negative.  Physical  Exam: Vital signs in last 24 hours: Temp:  [98.7 F (37.1 C)] 98.7 F (37.1 C) (12/08 0558) Pulse Rate:  [52-70] 66 (12/08 0558) Resp:  [16-20] 19 (12/08 0558) BP: (103-139)/(47-121) 114/58 mmHg (12/08 0558) SpO2:  [79 %-100 %] 97 % (12/08 0952) Weight:  [122 lb 5.7 oz (55.5 kg)] 122 lb 5.7 oz (55.5 kg) (12/08 0558) Last BM Date: 09/03/15 General:   Alert,older WF on oxygen  Well-developed, well-nourished, pleasant and cooperative in NAD Head:  Normocephalic and atraumatic. Eyes:  Sclera ?early icterus   Conjunctiva pink. Ears:  Normal auditory acuity. Nose:  No deformity, discharge,  or lesions. Mouth:  No deformity or lesions.   Neck:  Supple; no masses or thyromegaly. Lungs:  Clear throughout to auscultation.  Decreased BS . Heart:  Regular rate and rhythm; no murmurs, clicks, rubs,  or gallops. Abdomen:  Soft,nontender, BS active,nonpalp mass or hsm, multiple incisional scars.   Rectal:  Deferred  Msk:  Symmetrical without gross deformities. . Pulses:  Normal pulses noted. Extremities:  Without clubbing or edema. Neurologic:  Alert and  oriented x4;  grossly normal neurologically. Skin:  Intact without significant lesions or rashes.. Psych:  Alert and cooperative. Normal mood and affect.  Intake/Output from previous day:   Intake/Output this shift:    Lab Results:  Recent Labs  09/03/15 2333 09/04/15 0634  WBC 10.0 8.5  HGB 12.8 11.6*  HCT 39.0 36.4  PLT 280 251   BMET  Recent Labs  09/03/15 2333 09/04/15 0634  NA 140 135  K 3.9 3.8  CL 97* 96*  CO2 34* 31  GLUCOSE 135* 100*  BUN 16 14  CREATININE 1.04* 0.77  CALCIUM 9.9 9.3   LFT  Recent Labs  09/04/15 0634  PROT 7.5  ALBUMIN 3.1*  AST 251*  ALT 226*  ALKPHOS 603*  BILITOT 1.6*  BILIDIR 1.0*  IBILI 0.6   PT/INR No results for input(s): LABPROT, INR in the last 72 hours. Hepatitis Panel No results for input(s): HEPBSAG, HCVAB, HEPAIGM, HEPBIGM in the last 72 hours.   IMPRESSION:   #1  69 yo female with acute severe upper abdominal pain onset last evening with elevated LFTs- workup concerning for choledocholithiasis/? early cholangitis by CT but no fever or elevated WBC Pt is pain free today -?passed stone  #2 severe COPD- O2 dependent #3 CAD- s/p MI, stents 2006 #4 pulmonary hypertension #5 hx breast CA #6 s/p cholecystectomy 2011 #7 hx bowel infarction - s/p resection #8 HTN #9 anxiety  PLAN: #1  Will obtain MRCP, then decide if ERCP indicated. She is high risk for procedures, anesthesia given severe COPD, pulmonary hypertension, CAD. Discussed in detail  with pt and she is agreeable. #2 clear liquids #3 serial LFT's  #4 Continue Primaxin    Amy Esterwood  09/04/2015, 10:06 AM     Attending physician's note   I have taken an interval history, reviewed the chart and examined the patient. I agree with the Advanced Practitioner's note, impression and recommendations. Acute upper abd pain and elevated LFTs in post cholecystectomy patient. CT showing dilated CBD, distal CBD stone and peri porta hepatis inflammatory changes. Choledocholithiasis however as abd pain has completely resolved today will confirm that CBD stone has not passed with MRCP as she is at high risk for ERCP and anesthesia given comorbidities. Continue Primaxin. Trend CMP, CBC.  Lucio Edward, MD [facility] [phone] Mon-Fri 8a-5p [phone] after 5p, weekends, holidays

## 2015-09-05 NOTE — Progress Notes (Signed)
[facility] PROGRESS NOTE  [name] [id number] DOB: 12-Aug-1946 DOA: 09/03/2015 PCP: Scarlette Calico, MD  Assessment/Plan: Principal Problem:   Acute cholangitis - As per GI recommendations will trend liver enzymes and observe post ercp - ERCP on 09/05/15 pt is s/p single stone removal from bile duct using balloon extraction - continue supportive therapy  Active Problems:   COPD (chronic obstructive pulmonary disease) ([place]) - stable continue current medication regimen    CAD (coronary artery disease), native coronary artery - stable no chest pain reported    Choledocholithiasis - s/p ERCP    Elevated LFTs - Reassess CMP next am.  Code Status: full Family Communication: no family at bedside.  Disposition Plan: Will trend liver enzymes and observe post ERCP once cleared for d/c from GI standpoint will plan on transitioning out of the hospital   Consultants:  GI  Procedures: ERCP  Antibiotics:  Currently on imipenem  HPI/Subjective: Pt has no new complaints. No acute issues overnight.  Objective: Filed Vitals:   09/05/15 1100 09/05/15 1111  BP: 123/76 128/77  Pulse: 82 87  Temp:  97.6 F (36.4 C)  Resp: 15 16    Intake/Output Summary (Last 24 hours) at 09/05/15 1357 Last data filed at 09/05/15 1100  Gross per 24 hour  Intake 2064.58 ml  Output      0 ml  Net 2064.58 ml   Filed Weights   09/04/15 0558 09/05/15 0451  Weight: 55.5 kg (122 lb 5.7 oz) 58.8 kg (129 lb 10.1 oz)    Exam:   General:  Pt in nad, alert and awake  Cardiovascular: no cyanosis or edema  Respiratory: No increased work of breathing, equal chest rise, no audible wheezes  Abdomen: Nondistended, no guarding  Musculoskeletal: No cyanosis   Data Reviewed: Basic Metabolic Panel:  Recent Labs Lab 09/03/15 2333 09/04/15 0634  NA 140 135  K 3.9 3.8  CL 97* 96*  CO2 34* 31  GLUCOSE 135* 100*  BUN 16 14  CREATININE 1.04* 0.77  CALCIUM 9.9 9.3   Liver  Function Tests:  Recent Labs Lab 09/03/15 2333 09/04/15 0634  AST 334* 251*  ALT 250* 226*  ALKPHOS 756* 603*  BILITOT 0.9 1.6*  PROT 8.0 7.5  ALBUMIN 3.6 3.1*    Recent Labs Lab 09/03/15 2333 09/04/15 0634  LIPASE 132* 76*   No results for input(s): AMMONIA in the last 168 hours. CBC:  Recent Labs Lab 09/03/15 2333 09/04/15 0634  WBC 10.0 8.5  NEUTROABS  --  6.5  HGB 12.8 11.6*  HCT 39.0 36.4  MCV 96.3 97.8  PLT 280 251   Cardiac Enzymes:  Recent Labs Lab 09/04/15 0634 09/04/15 1230 09/04/15 1850  TROPONINI <0.03 <0.03 <0.03   BNP (last 3 results)  Recent Labs  09/03/15 2333  BNP 253.4*    ProBNP (last 3 results) No results for input(s): PROBNP in the last 8760 hours.  CBG:  Recent Labs Lab 09/05/15 0011 09/05/15 0406 09/05/15 0745 09/05/15 1051 09/05/15 1130  GLUCAP 113* 77 76 123* 107*    Recent Results (from the past 240 hour(s))  Culture, blood (routine x 2)     Status: None (Preliminary result)   Collection Time: 09/04/15  3:08 AM  Result Value Ref Range Status   Specimen Description BLOOD LEFT FOREARM  Final   Special Requests BOTTLES DRAWN AEROBIC AND ANAEROBIC 5 ML  Final   Culture   Final    NO GROWTH 1 DAY Performed at [facility]  Hospital    Report Status PENDING  Incomplete  Culture, blood (routine x 2)     Status: None (Preliminary result)   Collection Time: 09/04/15  3:15 AM  Result Value Ref Range Status   Specimen Description BLOOD LEFT ANTECUBITAL  Final   Special Requests BOTTLES DRAWN AEROBIC AND ANAEROBIC 5ML  Final   Culture   Final    NO GROWTH 1 DAY Performed at [facility]    Report Status PENDING  Incomplete     Studies: Dg Chest 2 View  09/03/2015  CLINICAL DATA:  69 year old female with abdominal pain and chest pain EXAM: CHEST  2 VIEW COMPARISON:  Chest radiograph dated 07/18/2014 and abdominal CT dated 01/01/2015 FINDINGS: Two views of the chest demonstrate emphysematous changes of the  lungs with mild diffuse interstitial prominence. There is blunting of the costophrenic angles which may represent small pleural effusions. No focal consolidation or pneumothorax. Stable cardiac silhouette. The osseous structures appear unremarkable. Right axillary surgical clips noted. IMPRESSION: Mild diffuse interstitial prominence.  No focal consolidation. Electronically Signed   By: Anner Crete M.D.   On: 09/03/2015 23:44   Ct Abdomen Pelvis W Contrast  09/04/2015  CLINICAL DATA:  Acute onset of upper abdominal pain, radiating to the back. Elevated lipase. Initial encounter. EXAM: CT ABDOMEN AND PELVIS WITH CONTRAST TECHNIQUE: Multidetector CT imaging of the abdomen and pelvis was performed using the standard protocol following bolus administration of intravenous contrast. CONTRAST:  30mL OMNIPAQUE IOHEXOL 300 MG/ML  SOLN COMPARISON:  CT of the abdomen and pelvis from 01/30/2012, and right upper quadrant ultrasound performed earlier today at 1:18 a.m. FINDINGS: The visualized lung bases are clear. Scattered coronary artery calcifications are seen. There is diffuse prominence of the intrahepatic biliary ducts, with prominence of the common bile duct to 1.4 cm. Surrounding soft tissue inflammation is noted. The patient is status post cholecystectomy, with clips noted along the gallbladder fossa. A 7 mm stone is noted at the distal common bile duct, stable from 2013. Though this may still be within normal limits, the mild soft tissue edema about the porta hepatis raises question for cholangitis. The pancreas and adrenal glands are unremarkable. The kidneys are unremarkable in appearance. There is no evidence of hydronephrosis. No renal or ureteral stones are seen. No perinephric stranding is appreciated. No free fluid is identified. The proximal small bowel is unremarkable in appearance. The stomach is within normal limits. No acute vascular abnormalities are seen. Scattered calcification is noted along the  abdominal aorta and its branches. There is minimal ectasia of the distal abdominal aorta. A moderate anterior abdominal wall hernia is noted, containing a relatively long segment of small bowel. There is no evidence for obstruction. Postoperative change is noted along the ascending colon. The remaining colon is grossly unremarkable in appearance. The bladder is moderately distended and grossly remarkable. The uterus is unremarkable in appearance. The ovaries are grossly symmetric. No suspicious adnexal masses are seen. No inguinal lymphadenopathy is seen. No acute osseous abnormalities are identified. Facet disease noted along the lumbar spine, with mild grade 1 anterolisthesis of L4 on L5. There is mild chronic loss of height at L1 and L3. IMPRESSION: 1. Diffuse prominence of the intrahepatic biliary ducts, with prominence of the common bile duct to 1.4 cm. This is new from 2013, with new surrounding soft tissue inflammation at the porta hepatis. This raises question for cholangitis. Would correlate for associated symptoms. 2. 7 mm stone at the distal common bile duct is  stable from 2013. 3. Scattered coronary artery calcifications seen. 4. Scattered calcification along the abdominal aorta and its branches. 5. Moderate anterior abdominal wall hernia, containing a relatively long segment of small bowel. No evidence for obstruction. Electronically Signed   By: Garald Balding M.D.   On: 09/04/2015 02:40   Mr 3d Recon At Scanner  09/04/2015  CLINICAL DATA:  69 year old female with history of severe abdominal pain yesterday evening. CT scan demonstrated findings concerning for potential cholangitis, and stone in the distal common bile duct. EXAM: MRI ABDOMEN WITHOUT AND WITH CONTRAST (INCLUDING MRCP) TECHNIQUE: Multiplanar multisequence MR imaging of the abdomen was performed both before and after the administration of intravenous contrast. Heavily T2-weighted images of the biliary and pancreatic ducts were obtained,  and three-dimensional MRCP images were rendered by post processing. CONTRAST:  70mL MULTIHANCE GADOBENATE DIMEGLUMINE 529 MG/ML IV SOLN COMPARISON:  CT of the abdomen and pelvis 09/04/2015. FINDINGS: Lower chest: Lipomatous hypertrophy of the interatrial septum. Otherwise, unremarkable. Hepatobiliary: Mild diffuse increased T2 signal intensity in a periportal distribution throughout the liver. MRCP images demonstrate minimal prominence of the intrahepatic bile ducts. Common bile duct is mildly dilated measuring 8 mm within the porta hepatis. In addition, there is a 8 mm filling defect in the distal common bile duct shortly before the ampulla, compatible with a retained ductal stone. No suspicious cystic or solid hepatic lesions are identified. No significant periportal enhancement identified on post gadolinium images. Status post cholecystectomy. Pancreas: No pancreatic mass. No pancreatic ductal dilatation. No pancreatic or peripancreatic fluid or inflammatory changes. Spleen: Spleen is normal in appearance. Adrenals/Urinary Tract: Focal area of cortical thinning in the posterior aspect of the upper pole of left kidney, most compatible with chronic post infectious or inflammatory scarring. Sub cm lesions in the kidneys bilaterally are low T1 signal intensity, high T2 signal intensity, and do not enhance, compatible with tiny simple cysts, measuring up to 9 mm in the lower pole of the right kidney. No aggressive renal lesions identified. No hydroureteronephrosis in the visualized abdomen. Stomach/Bowel: Visualized portions are unremarkable. Vascular/Lymphatic: Atherosclerosis in the visualized abdominal vasculature, without evidence of aneurysm. No lymphadenopathy noted in the abdomen. Other: No significant volume of ascites in the visualized peritoneal cavity. Musculoskeletal: No aggressive osseous lesions are identified in the visualized portions of the peritoneal cavity. IMPRESSION: 1. 8 mm filling defect in the  distal common bile duct shortly before the ampulla, compatible with a retained ductal stone. This appears to be a chronic finding and has been present on prior studies dating back to 01/30/2012. 2. While there is minimal intrahepatic biliary ductal prominence and slight dilatation of the common bile duct, these findings are commonly seen in the setting of benign post cholecystectomy physiology. 3. There is a small amount of mild periportal edema on today's examination. No periportal enhancement to suggest cholangitis at this time. 4. Atherosclerosis. 5. Additional incidental findings, as above. Electronically Signed   By: Vinnie Langton M.D.   On: 09/04/2015 15:22   Dg Ercp With Sphincterotomy  09/05/2015  CLINICAL DATA:  Sphincterotomy EXAM: ERCP TECHNIQUE: Multiple spot images obtained with the fluoroscopic device and submitted for interpretation post-procedure. FLUOROSCOPY TIME:  Radiation Exposure Index (as provided by the fluoroscopic device): 17.89 If the device does not provide the exposure index: Fluoroscopy Time:  1 minutes and 54 seconds Number of Acquired Images:  6 COMPARISON:  None. FINDINGS: Images demonstrate contrast filling the biliary tree and balloon stone retrieval. IMPRESSION: See above. These images were submitted  for radiologic interpretation only. Please see the procedural report for the amount of contrast and the fluoroscopy time utilized. Electronically Signed   By: Marybelle Killings M.D.   On: 09/05/2015 10:50   Mr Jeananne Rama W/wo Cm/mrcp  09/04/2015  CLINICAL DATA:  69 year old female with history of severe abdominal pain yesterday evening. CT scan demonstrated findings concerning for potential cholangitis, and stone in the distal common bile duct. EXAM: MRI ABDOMEN WITHOUT AND WITH CONTRAST (INCLUDING MRCP) TECHNIQUE: Multiplanar multisequence MR imaging of the abdomen was performed both before and after the administration of intravenous contrast. Heavily T2-weighted images of the biliary  and pancreatic ducts were obtained, and three-dimensional MRCP images were rendered by post processing. CONTRAST:  55mL MULTIHANCE GADOBENATE DIMEGLUMINE 529 MG/ML IV SOLN COMPARISON:  CT of the abdomen and pelvis 09/04/2015. FINDINGS: Lower chest: Lipomatous hypertrophy of the interatrial septum. Otherwise, unremarkable. Hepatobiliary: Mild diffuse increased T2 signal intensity in a periportal distribution throughout the liver. MRCP images demonstrate minimal prominence of the intrahepatic bile ducts. Common bile duct is mildly dilated measuring 8 mm within the porta hepatis. In addition, there is a 8 mm filling defect in the distal common bile duct shortly before the ampulla, compatible with a retained ductal stone. No suspicious cystic or solid hepatic lesions are identified. No significant periportal enhancement identified on post gadolinium images. Status post cholecystectomy. Pancreas: No pancreatic mass. No pancreatic ductal dilatation. No pancreatic or peripancreatic fluid or inflammatory changes. Spleen: Spleen is normal in appearance. Adrenals/Urinary Tract: Focal area of cortical thinning in the posterior aspect of the upper pole of left kidney, most compatible with chronic post infectious or inflammatory scarring. Sub cm lesions in the kidneys bilaterally are low T1 signal intensity, high T2 signal intensity, and do not enhance, compatible with tiny simple cysts, measuring up to 9 mm in the lower pole of the right kidney. No aggressive renal lesions identified. No hydroureteronephrosis in the visualized abdomen. Stomach/Bowel: Visualized portions are unremarkable. Vascular/Lymphatic: Atherosclerosis in the visualized abdominal vasculature, without evidence of aneurysm. No lymphadenopathy noted in the abdomen. Other: No significant volume of ascites in the visualized peritoneal cavity. Musculoskeletal: No aggressive osseous lesions are identified in the visualized portions of the peritoneal cavity.  IMPRESSION: 1. 8 mm filling defect in the distal common bile duct shortly before the ampulla, compatible with a retained ductal stone. This appears to be a chronic finding and has been present on prior studies dating back to 01/30/2012. 2. While there is minimal intrahepatic biliary ductal prominence and slight dilatation of the common bile duct, these findings are commonly seen in the setting of benign post cholecystectomy physiology. 3. There is a small amount of mild periportal edema on today's examination. No periportal enhancement to suggest cholangitis at this time. 4. Atherosclerosis. 5. Additional incidental findings, as above. Electronically Signed   By: Vinnie Langton M.D.   On: 09/04/2015 15:22   US Abdomen Limited Ruq  09/04/2015  CLINICAL DATA:  Acute onset of generalized abdominal pain. Elevated LFTs and elevated lipase. Initial encounter. EXAM: US ABDOMEN LIMITED - RIGHT UPPER QUADRANT COMPARISON:  CT of the abdomen and pelvis performed 01/30/2012, and abdominal ultrasound performed 08/01/2011 FINDINGS: Gallbladder: Status post cholecystectomy.  No retained stones seen. Common bile duct: Diameter: 1.1 cm, within normal limits status post cholecystectomy. Liver: No focal lesion identified. Within normal limits in parenchymal echogenicity. IMPRESSION: Unremarkable ultrasound of the right upper quadrant. Status post cholecystectomy. Electronically Signed   By: Garald Balding M.D.   On: 09/04/2015 01:43  Scheduled Meds: . budesonide-formoterol  2 puff Inhalation BID  . dextrose  1 ampule Intravenous Once  . imipenem-cilastatin  250 mg Intravenous 4 times per day  . ipratropium-albuterol  3 mL Nebulization BID   Continuous Infusions:   Time spent: > 35 minutes   Velvet Bathe  [facility] Pager [phone] If 7PM-7AM, please contact night-coverage at [email or web address], password [facility] 09/05/2015, 1:57 PM  LOS: 1 day

## 2015-09-05 NOTE — Interval H&P Note (Signed)
History and Physical Interval Note:  09/05/2015 9:08 AM  [name]  has presented today for surgery, with the diagnosis of CBD stone  The various methods of treatment have been discussed with the patient and family. After consideration of risks, benefits and other options for treatment, the patient has consented to  Procedure(s): ENDOSCOPIC RETROGRADE CHOLANGIOPANCREATOGRAPHY (ERCP) (N/A) as a surgical intervention .  The patient's history has been reviewed, patient examined, no change in status, stable for surgery.  I have reviewed the patient's chart and labs.  Questions were answered to the patient's satisfaction.     Pricilla Riffle. Fuller Plan

## 2015-09-05 NOTE — Anesthesia Procedure Notes (Signed)
Procedure Name: Intubation Date/Time: 09/05/2015 9:31 AM Performed by: Anne Fu Pre-anesthesia Checklist: Patient identified, Emergency Drugs available, Suction available, Patient being monitored and Timeout performed Patient Re-evaluated:Patient Re-evaluated prior to inductionOxygen Delivery Method: Circle system utilized Preoxygenation: Pre-oxygenation with 100% oxygen Intubation Type: IV induction Ventilation: Mask ventilation without difficulty Laryngoscope Size: Mac and 4 Grade View: Grade I Tube type: Oral Tube size: 7.5 mm Number of attempts: 1 Airway Equipment and Method: Stylet Placement Confirmation: ETT inserted through vocal cords under direct vision,  positive ETCO2,  CO2 detector and breath sounds checked- equal and bilateral Secured at: 19 cm Tube secured with: Tape Dental Injury: Teeth and Oropharynx as per pre-operative assessment

## 2015-09-05 NOTE — Transfer of Care (Signed)
Immediate Anesthesia Transfer of Care Note  Patient: [name]  Procedure(s) Performed: Procedure(s): ENDOSCOPIC RETROGRADE CHOLANGIOPANCREATOGRAPHY (ERCP) (N/A)  Patient Location: PACU  Anesthesia Type:General  Level of Consciousness:  sedated, patient cooperative and responds to stimulation  Airway & Oxygen Therapy:Patient Spontanous Breathing and Patient connected to face mask oxgen  Post-op Assessment:  Report given to PACU RN and Post -op Vital signs reviewed and stable  Post vital signs:  Reviewed and stable  Last Vitals:  Filed Vitals:   09/05/15 0842 09/05/15 1024  BP: 133/49 128/71  Pulse: 71 89  Temp: 36.7 C   Resp: 20 15    Complications: No apparent anesthesia complications

## 2015-09-05 NOTE — Anesthesia Preprocedure Evaluation (Addendum)
Anesthesia Evaluation  Patient identified by MRN, date of birth, ID band Patient awake    Reviewed: Allergy & Precautions, NPO status , Patient's Chart, lab work & pertinent test results  Airway Mallampati: II  TM Distance: >3 FB Neck ROM: Full    Dental no notable dental hx.    Pulmonary COPD,  oxygen dependent, Current Smoker,    Pulmonary exam normal breath sounds clear to auscultation       Cardiovascular hypertension, Pt. on medications (-) angina+ CAD, + Past MI and + Cardiac Stents (2012)  Normal cardiovascular exam+ dysrhythmias Ventricular Tachycardia + Valvular Problems/Murmurs AS  Rhythm:Regular Rate:Normal  Mod to severe pulm htn   Neuro/Psych Anxiety Depression negative neurological ROS     GI/Hepatic negative GI ROS, Neg liver ROS,   Endo/Other  negative endocrine ROS  Renal/GU negative Renal ROS  negative genitourinary   Musculoskeletal negative musculoskeletal ROS (+)   Abdominal   Peds negative pediatric ROS (+)  Hematology negative hematology ROS (+)   Anesthesia Other Findings   Reproductive/Obstetrics negative OB ROS                            Anesthesia Physical Anesthesia Plan  ASA: IV  Anesthesia Plan: General   Post-op Pain Management:    Induction: Intravenous  Airway Management Planned: Oral ETT  Additional Equipment:   Intra-op Plan:   Post-operative Plan: Extubation in OR and Possible Post-op intubation/ventilation  Informed Consent: I have reviewed the patients History and Physical, chart, labs and discussed the procedure including the risks, benefits and alternatives for the proposed anesthesia with the patient or authorized representative who has indicated his/her understanding and acceptance.   Dental advisory given  Plan Discussed with: CRNA  Anesthesia Plan Comments:         Anesthesia Quick Evaluation

## 2015-09-06 LAB — GLUCOSE, CAPILLARY
Glucose-Capillary: 100 mg/dL — ABNORMAL HIGH (ref 65–99)
Glucose-Capillary: 108 mg/dL — ABNORMAL HIGH (ref 65–99)
Glucose-Capillary: 109 mg/dL — ABNORMAL HIGH (ref 65–99)
Glucose-Capillary: 69 mg/dL (ref 65–99)
Glucose-Capillary: 93 mg/dL (ref 65–99)
Glucose-Capillary: 97 mg/dL (ref 65–99)

## 2015-09-06 LAB — COMPREHENSIVE METABOLIC PANEL
ALT: 128 U/L — ABNORMAL HIGH (ref 14–54)
AST: 77 U/L — ABNORMAL HIGH (ref 15–41)
Albumin: 3.2 g/dL — ABNORMAL LOW (ref 3.5–5.0)
Alkaline Phosphatase: 462 U/L — ABNORMAL HIGH (ref 38–126)
Anion gap: 3 — ABNORMAL LOW (ref 5–15)
BUN: 6 mg/dL (ref 6–20)
CO2: 32 mmol/L (ref 22–32)
Calcium: 9.4 mg/dL (ref 8.9–10.3)
Chloride: 101 mmol/L (ref 101–111)
Creatinine, Ser: 0.74 mg/dL (ref 0.44–1.00)
GFR calc Af Amer: >60 mL/min (ref >60)
GFR calc non Af Amer: >60 mL/min (ref >60)
Glucose, Bld: 103 mg/dL — ABNORMAL HIGH (ref 65–99)
Potassium: 4.4 mmol/L (ref 3.5–5.1)
Sodium: 136 mmol/L (ref 135–145)
Total Bilirubin: 0.8 mg/dL (ref 0.3–1.2)
Total Protein: 7.3 g/dL (ref 6.5–8.1)

## 2015-09-06 MED ORDER — BUTALBITAL-APAP-CAFFEINE 50-325-40 MG PO TABS
1.0000 | ORAL_TABLET | Freq: Once | ORAL | Status: AC
  Administered 2015-09-06: 1 via ORAL
  Filled 2015-09-06: qty 1

## 2015-09-06 MED ORDER — ALPRAZOLAM 1 MG PO TABS
1.0000 mg | ORAL_TABLET | Freq: Three times a day (TID) | ORAL | Status: DC | PRN
  Administered 2015-09-06: 1 mg via ORAL
  Filled 2015-09-06: qty 1

## 2015-09-06 MED ORDER — NORTRIPTYLINE HCL 25 MG PO CAPS
50.0000 mg | ORAL_CAPSULE | Freq: Every day | ORAL | Status: DC
  Administered 2015-09-06: 50 mg via ORAL
  Filled 2015-09-06: qty 2

## 2015-09-06 NOTE — Progress Notes (Addendum)
    Progress Note   Subjective  Mild epigastric soreness. Tolerated clear liquids.    Objective  Vital signs in last 24 hours: Temp:  [97.5 F (36.4 C)-98.2 F (36.8 C)] 97.5 F (36.4 C) (12/10 0407) Pulse Rate:  [82-96] 88 (12/10 0407) Resp:  [10-26] 16 (12/10 0407) BP: (103-128)/(66-91) 127/74 mmHg (12/10 0407) SpO2:  [93 %-100 %] 98 % (12/10 0407) Weight:  [126 lb 8 oz (57.38 kg)] 126 lb 8 oz (57.38 kg) (12/10 0407) Last BM Date: 09/05/15  General: Alert, well-developed, in NAD Heart:  Regular rate and rhythm; no murmurs Chest: Clear to ascultation bilaterally Abdomen:  Soft, nontender and nondistended. Normal bowel sounds, without guarding, and without rebound.   Extremities:  Without edema. Neurologic:  Alert and  oriented x4; grossly normal neurologically. Psych:  Alert and cooperative. Normal mood and affect.  Intake/Output from previous day: 12/09 0701 - 12/10 0700 In: 750 [I.V.:350; IV Piggyback:400] Out: -  Intake/Output this shift:    Lab Results:  Recent Labs  09/03/15 2333 09/04/15 0634  WBC 10.0 8.5  HGB 12.8 11.6*  HCT 39.0 36.4  PLT 280 251   BMET  Recent Labs  09/03/15 2333 09/04/15 0634 09/06/15 0500  NA 140 135 136  K 3.9 3.8 4.4  CL 97* 96* 101  CO2 34* 31 32  GLUCOSE 135* 100* 103*  BUN 16 14 6   CREATININE 1.04* 0.77 0.74  CALCIUM 9.9 9.3 9.4   LFT  Recent Labs  09/04/15 0634 09/06/15 0500  PROT 7.5 7.3  ALBUMIN 3.1* 3.2*  AST 251* 77*  ALT 226* 128*  ALKPHOS 603* 462*  BILITOT 1.6* 0.8  BILIDIR 1.0*  --   IBILI 0.6  --       Assessment & Plan   1. Choledocholithiasis, post ERCP/sphincterotomy, stone extraction. LFTs significantly improved today. Mild epigastric soreness without abdominal tenderness. Advance diet to full liquids as tolerated. No ASA/NSAIDs for 2 weeks post sphincterotomy. No clear evidence for cholangitis so will discontinue antibiotics. If she tolerates full liquids would consider discharge later  today or tomorrow. GI follow up as needed. Post hospital follow up with her PCP.  2. Severe COPD, appears stable.    Principal Problem:   Acute cholangitis Active Problems:   COPD (chronic obstructive pulmonary disease) ([facility])   CAD (coronary artery disease), native coronary artery   Choledocholithiasis   RUQ pain   Elevated LFTs    LOS: 2 days   Karsten Howry T. Fuller Plan  09/06/2015, 9:30 AM BY:[id number] 8a-5p weekdays [phone] after 5p, weekends, holidays

## 2015-09-06 NOTE — Progress Notes (Signed)
[facility] PROGRESS NOTE  [name] [id number] DOB: May 27, 1946 DOA: 09/03/2015 PCP: Scarlette Calico, MD  Assessment/Plan: Principal Problem:   Acute cholangitis - As per GI recommendations will trend liver enzymes and observe post ercp - ERCP on 09/05/15 pt is s/p single stone removal from bile duct using balloon extraction - continue supportive therapy as patient still reporting much discomfort  Anxiety - not well controlled - Will increase xanax to home regimen dose.  Active Problems:   COPD (chronic obstructive pulmonary disease) (HCC) - stable continue current medication regimen    CAD (coronary artery disease), native coronary artery - stable no chest pain reported    Choledocholithiasis - s/p ERCP    Elevated LFTs - Reassess CMP next am.  Code Status: full Family Communication: no family at bedside.  Disposition Plan: Will trend liver enzymes and observe post ERCP once cleared for d/c from GI standpoint will plan on transitioning out of the hospital  Consultants:  GI  Procedures: ERCP  Antibiotics:  Currently on imipenem  HPI/Subjective: Pt's main concern is that her anxiety is not well controlled.  Objective: Filed Vitals:   09/05/15 2019 09/06/15 0407  BP: 119/66 127/74  Pulse: 96 88  Temp: 98.1 F (36.7 C) 97.5 F (36.4 C)  Resp: 16 16    Intake/Output Summary (Last 24 hours) at 09/06/15 1446 Last data filed at 09/06/15 0532  Gross per 24 hour  Intake    300 ml  Output      0 ml  Net    300 ml   Filed Weights   09/04/15 0558 09/05/15 0451 09/06/15 0407  Weight: 55.5 kg (122 lb 5.7 oz) 58.8 kg (129 lb 10.1 oz) 57.38 kg (126 lb 8 oz)    Exam:   General:  Pt in nad, alert and awake  Cardiovascular: no cyanosis or edema  Respiratory: No increased work of breathing, equal chest rise, no audible wheezes  Abdomen: Nondistended, no guarding  Musculoskeletal: No cyanosis   Data Reviewed: Basic Metabolic Panel:  Recent  Labs Lab 09/03/15 2333 09/04/15 0634 09/06/15 0500  NA 140 135 136  K 3.9 3.8 4.4  CL 97* 96* 101  CO2 34* 31 32  GLUCOSE 135* 100* 103*  BUN 16 14 6   CREATININE 1.04* 0.77 0.74  CALCIUM 9.9 9.3 9.4   Liver Function Tests:  Recent Labs Lab 09/03/15 2333 09/04/15 0634 09/06/15 0500  AST 334* 251* 77*  ALT 250* 226* 128*  ALKPHOS 756* 603* 462*  BILITOT 0.9 1.6* 0.8  PROT 8.0 7.5 7.3  ALBUMIN 3.6 3.1* 3.2*    Recent Labs Lab 09/03/15 2333 09/04/15 0634  LIPASE 132* 76*   No results for input(s): AMMONIA in the last 168 hours. CBC:  Recent Labs Lab 09/03/15 2333 09/04/15 0634  WBC 10.0 8.5  NEUTROABS  --  6.5  HGB 12.8 11.6*  HCT 39.0 36.4  MCV 96.3 97.8  PLT 280 251   Cardiac Enzymes:  Recent Labs Lab 09/04/15 0634 09/04/15 1230 09/04/15 1850  TROPONINI <0.03 <0.03 <0.03   BNP (last 3 results)  Recent Labs  09/03/15 2333  BNP 253.4*    ProBNP (last 3 results) No results for input(s): PROBNP in the last 8760 hours.  CBG:  Recent Labs Lab 09/05/15 1636 09/05/15 2016 09/06/15 0004 09/06/15 0400 09/06/15 0738  GLUCAP 110* 92 108* 93 69    Recent Results (from the past 240 hour(s))  Culture, blood (routine x 2)     Status:  None (Preliminary result)   Collection Time: 09/04/15  3:08 AM  Result Value Ref Range Status   Specimen Description BLOOD LEFT FOREARM  Final   Special Requests BOTTLES DRAWN AEROBIC AND ANAEROBIC 5 ML  Final   Culture   Final    NO GROWTH 2 DAYS Performed at [facility]    Report Status PENDING  Incomplete  Culture, blood (routine x 2)     Status: None (Preliminary result)   Collection Time: 09/04/15  3:15 AM  Result Value Ref Range Status   Specimen Description BLOOD LEFT ANTECUBITAL  Final   Special Requests BOTTLES DRAWN AEROBIC AND ANAEROBIC 5ML  Final   Culture   Final    NO GROWTH 2 DAYS Performed at [facility]    Report Status PENDING  Incomplete     Studies: Mr 3d Recon At  Scanner  09/04/2015  CLINICAL DATA:  69 year old female with history of severe abdominal pain yesterday evening. CT scan demonstrated findings concerning for potential cholangitis, and stone in the distal common bile duct. EXAM: MRI ABDOMEN WITHOUT AND WITH CONTRAST (INCLUDING MRCP) TECHNIQUE: Multiplanar multisequence MR imaging of the abdomen was performed both before and after the administration of intravenous contrast. Heavily T2-weighted images of the biliary and pancreatic ducts were obtained, and three-dimensional MRCP images were rendered by post processing. CONTRAST:  49mL MULTIHANCE GADOBENATE DIMEGLUMINE 529 MG/ML IV SOLN COMPARISON:  CT of the abdomen and pelvis 09/04/2015. FINDINGS: Lower chest: Lipomatous hypertrophy of the interatrial septum. Otherwise, unremarkable. Hepatobiliary: Mild diffuse increased T2 signal intensity in a periportal distribution throughout the liver. MRCP images demonstrate minimal prominence of the intrahepatic bile ducts. Common bile duct is mildly dilated measuring 8 mm within the porta hepatis. In addition, there is a 8 mm filling defect in the distal common bile duct shortly before the ampulla, compatible with a retained ductal stone. No suspicious cystic or solid hepatic lesions are identified. No significant periportal enhancement identified on post gadolinium images. Status post cholecystectomy. Pancreas: No pancreatic mass. No pancreatic ductal dilatation. No pancreatic or peripancreatic fluid or inflammatory changes. Spleen: Spleen is normal in appearance. Adrenals/Urinary Tract: Focal area of cortical thinning in the posterior aspect of the upper pole of left kidney, most compatible with chronic post infectious or inflammatory scarring. Sub cm lesions in the kidneys bilaterally are low T1 signal intensity, high T2 signal intensity, and do not enhance, compatible with tiny simple cysts, measuring up to 9 mm in the lower pole of the right kidney. No aggressive renal  lesions identified. No hydroureteronephrosis in the visualized abdomen. Stomach/Bowel: Visualized portions are unremarkable. Vascular/Lymphatic: Atherosclerosis in the visualized abdominal vasculature, without evidence of aneurysm. No lymphadenopathy noted in the abdomen. Other: No significant volume of ascites in the visualized peritoneal cavity. Musculoskeletal: No aggressive osseous lesions are identified in the visualized portions of the peritoneal cavity. IMPRESSION: 1. 8 mm filling defect in the distal common bile duct shortly before the ampulla, compatible with a retained ductal stone. This appears to be a chronic finding and has been present on prior studies dating back to 01/30/2012. 2. While there is minimal intrahepatic biliary ductal prominence and slight dilatation of the common bile duct, these findings are commonly seen in the setting of benign post cholecystectomy physiology. 3. There is a small amount of mild periportal edema on today's examination. No periportal enhancement to suggest cholangitis at this time. 4. Atherosclerosis. 5. Additional incidental findings, as above. Electronically Signed   By: Mauri Brooklyn.D.  On: 09/04/2015 15:22   Dg Ercp With Sphincterotomy  09/05/2015  CLINICAL DATA:  Sphincterotomy EXAM: ERCP TECHNIQUE: Multiple spot images obtained with the fluoroscopic device and submitted for interpretation post-procedure. FLUOROSCOPY TIME:  Radiation Exposure Index (as provided by the fluoroscopic device): 17.89 If the device does not provide the exposure index: Fluoroscopy Time:  1 minutes and 54 seconds Number of Acquired Images:  6 COMPARISON:  None. FINDINGS: Images demonstrate contrast filling the biliary tree and balloon stone retrieval. IMPRESSION: See above. These images were submitted for radiologic interpretation only. Please see the procedural report for the amount of contrast and the fluoroscopy time utilized. Electronically Signed   By: Marybelle Killings M.D.    On: 09/05/2015 10:50   Mr Jeananne Rama W/wo Cm/mrcp  09/04/2015  CLINICAL DATA:  69 year old female with history of severe abdominal pain yesterday evening. CT scan demonstrated findings concerning for potential cholangitis, and stone in the distal common bile duct. EXAM: MRI ABDOMEN WITHOUT AND WITH CONTRAST (INCLUDING MRCP) TECHNIQUE: Multiplanar multisequence MR imaging of the abdomen was performed both before and after the administration of intravenous contrast. Heavily T2-weighted images of the biliary and pancreatic ducts were obtained, and three-dimensional MRCP images were rendered by post processing. CONTRAST:  55mL MULTIHANCE GADOBENATE DIMEGLUMINE 529 MG/ML IV SOLN COMPARISON:  CT of the abdomen and pelvis 09/04/2015. FINDINGS: Lower chest: Lipomatous hypertrophy of the interatrial septum. Otherwise, unremarkable. Hepatobiliary: Mild diffuse increased T2 signal intensity in a periportal distribution throughout the liver. MRCP images demonstrate minimal prominence of the intrahepatic bile ducts. Common bile duct is mildly dilated measuring 8 mm within the porta hepatis. In addition, there is a 8 mm filling defect in the distal common bile duct shortly before the ampulla, compatible with a retained ductal stone. No suspicious cystic or solid hepatic lesions are identified. No significant periportal enhancement identified on post gadolinium images. Status post cholecystectomy. Pancreas: No pancreatic mass. No pancreatic ductal dilatation. No pancreatic or peripancreatic fluid or inflammatory changes. Spleen: Spleen is normal in appearance. Adrenals/Urinary Tract: Focal area of cortical thinning in the posterior aspect of the upper pole of left kidney, most compatible with chronic post infectious or inflammatory scarring. Sub cm lesions in the kidneys bilaterally are low T1 signal intensity, high T2 signal intensity, and do not enhance, compatible with tiny simple cysts, measuring up to 9 mm in the lower pole of  the right kidney. No aggressive renal lesions identified. No hydroureteronephrosis in the visualized abdomen. Stomach/Bowel: Visualized portions are unremarkable. Vascular/Lymphatic: Atherosclerosis in the visualized abdominal vasculature, without evidence of aneurysm. No lymphadenopathy noted in the abdomen. Other: No significant volume of ascites in the visualized peritoneal cavity. Musculoskeletal: No aggressive osseous lesions are identified in the visualized portions of the peritoneal cavity. IMPRESSION: 1. 8 mm filling defect in the distal common bile duct shortly before the ampulla, compatible with a retained ductal stone. This appears to be a chronic finding and has been present on prior studies dating back to 01/30/2012. 2. While there is minimal intrahepatic biliary ductal prominence and slight dilatation of the common bile duct, these findings are commonly seen in the setting of benign post cholecystectomy physiology. 3. There is a small amount of mild periportal edema on today's examination. No periportal enhancement to suggest cholangitis at this time. 4. Atherosclerosis. 5. Additional incidental findings, as above. Electronically Signed   By: Vinnie Langton M.D.   On: 09/04/2015 15:22    Scheduled Meds: . budesonide-formoterol  2 puff Inhalation BID  . dextrose  1 ampule Intravenous Once  . ipratropium-albuterol  3 mL Nebulization BID  . nortriptyline  50 mg Oral QHS   Continuous Infusions:   Time spent: > 35 minutes   Velvet Bathe  [facility] Pager [phone] If 7PM-7AM, please contact night-coverage at [email or web address], password [facility] 09/06/2015, 2:46 PM  LOS: 2 days

## 2015-09-07 ENCOUNTER — Encounter (HOSPITAL_COMMUNITY): Payer: Self-pay | Admitting: Physician Assistant

## 2015-09-07 LAB — CBC
HCT: 38.3 % (ref 36.0–46.0)
Hemoglobin: 12.2 g/dL (ref 12.0–15.0)
MCH: 31.1 pg (ref 26.0–34.0)
MCHC: 31.9 g/dL (ref 30.0–36.0)
MCV: 97.7 fL (ref 78.0–100.0)
Platelets: 228 10*3/uL (ref 150–400)
RBC: 3.92 MIL/uL (ref 3.87–5.11)
RDW: 14.4 % (ref 11.5–15.5)
WBC: 11.8 10*3/uL — ABNORMAL HIGH (ref 4.0–10.5)

## 2015-09-07 LAB — COMPREHENSIVE METABOLIC PANEL
ALT: 91 U/L — ABNORMAL HIGH (ref 14–54)
AST: 43 U/L — ABNORMAL HIGH (ref 15–41)
Albumin: 3.1 g/dL — ABNORMAL LOW (ref 3.5–5.0)
Alkaline Phosphatase: 385 U/L — ABNORMAL HIGH (ref 38–126)
Anion gap: 7 (ref 5–15)
BUN: 7 mg/dL (ref 6–20)
CO2: 31 mmol/L (ref 22–32)
Calcium: 9.8 mg/dL (ref 8.9–10.3)
Chloride: 100 mmol/L — ABNORMAL LOW (ref 101–111)
Creatinine, Ser: 0.56 mg/dL (ref 0.44–1.00)
GFR calc Af Amer: >60 mL/min (ref >60)
GFR calc non Af Amer: >60 mL/min (ref >60)
Glucose, Bld: 113 mg/dL — ABNORMAL HIGH (ref 65–99)
Potassium: 4 mmol/L (ref 3.5–5.1)
Sodium: 138 mmol/L (ref 135–145)
Total Bilirubin: 0.7 mg/dL (ref 0.3–1.2)
Total Protein: 7.1 g/dL (ref 6.5–8.1)

## 2015-09-07 LAB — GLUCOSE, CAPILLARY
Glucose-Capillary: 102 mg/dL — ABNORMAL HIGH (ref 65–99)
Glucose-Capillary: 111 mg/dL — ABNORMAL HIGH (ref 65–99)
Glucose-Capillary: 131 mg/dL — ABNORMAL HIGH (ref 65–99)

## 2015-09-07 LAB — LIPASE, BLOOD: Lipase: 28 U/L (ref 11–51)

## 2015-09-07 MED ORDER — ASPIRIN 325 MG PO TBEC: 325.0000 mg | DELAYED_RELEASE_TABLET | Freq: Every morning | ORAL | Status: DC

## 2015-09-07 MED ORDER — OXYCODONE HCL 5 MG PO TABS: 5.0000 mg | ORAL_TABLET | Freq: Four times a day (QID) | ORAL | Status: DC | PRN

## 2015-09-07 MED ORDER — PANTOPRAZOLE SODIUM 40 MG PO TBEC: 40.0000 mg | DELAYED_RELEASE_TABLET | Freq: Every day | ORAL | Status: DC

## 2015-09-07 NOTE — Progress Notes (Signed)
Daily Rounding Note  09/07/2015, 9:27 AM  LOS: 3 days   SUBJECTIVE:       Feels better, some lingering minimal epigastric pain.  Sore throat and cough persist  OBJECTIVE:         Vital signs in last 24 hours:    Temp:  [97.7 F (36.5 C)-98.3 F (36.8 C)] 97.7 F (36.5 C) (12/11 0554) Pulse Rate:  [78-97] 97 (12/11 0554) Resp:  [18] 18 (12/11 0554) BP: (130-139)/(76-80) 139/80 mmHg (12/11 0554) SpO2:  [93 %-100 %] 93 % (12/11 0748) Weight:  [55.3 kg (121 lb 14.6 oz)] 55.3 kg (121 lb 14.6 oz) (12/11 0554) Last BM Date: 09/05/15 Filed Weights   09/05/15 0451 09/06/15 0407 09/07/15 0554  Weight: 58.8 kg (129 lb 10.1 oz) 57.38 kg (126 lb 8 oz) 55.3 kg (121 lb 14.6 oz)   General: looks somewhat frail and chronically ill.    Heart: RRR Chest: clear bil but diminished BS throughout  + cough.  Some dyspnea with speech.  Abdomen: soft, NT, ND.  Active BS  Extremities: no CCE Neuro/Psych:  Oriented x 3.  Alert.  Cooperative, somewhat anxious.   Intake/Output from previous day: 12/10 0701 - 12/11 0700 In: 240 [P.O.:240] Out: -   Intake/Output this shift:    Lab Results:  Recent Labs  09/07/15 0510  WBC 11.8*  HGB 12.2  HCT 38.3  PLT 228   BMET  Recent Labs  09/06/15 0500 09/07/15 0510  NA 136 138  K 4.4 4.0  CL 101 100*  CO2 32 31  GLUCOSE 103* 113*  BUN 6 7  CREATININE 0.74 0.56  CALCIUM 9.4 9.8   LFT  Recent Labs  09/06/15 0500 09/07/15 0510  PROT 7.3 7.1  ALBUMIN 3.2* 3.1*  AST 77* 43*  ALT 128* 91*  ALKPHOS 462* 385*  BILITOT 0.8 0.7   PT/INR No results for input(s): LABPROT, INR in the last 72 hours. Hepatitis Panel No results for input(s): HEPBSAG, HCVAB, HEPAIGM, HEPBIGM in the last 72 hours.  Studies/Results: Dg Ercp With Sphincterotomy  09/05/2015  CLINICAL DATA:  Sphincterotomy EXAM: ERCP TECHNIQUE: Multiple spot images obtained with the fluoroscopic device and submitted  for interpretation post-procedure. FLUOROSCOPY TIME:  Radiation Exposure Index (as provided by the fluoroscopic device): 17.89 If the device does not provide the exposure index: Fluoroscopy Time:  1 minutes and 54 seconds Number of Acquired Images:  6 COMPARISON:  None. FINDINGS: Images demonstrate contrast filling the biliary tree and balloon stone retrieval. IMPRESSION: See above. These images were submitted for radiologic interpretation only. Please see the procedural report for the amount of contrast and the fluoroscopy time utilized. Electronically Signed   By: Marybelle Killings M.D.   On: 09/05/2015 10:50    ASSESMENT:   *  Choledocholithiasis.   12/9 ERCP/shinct/stone extraction.  LFTs continue to improve.   *   Hx bowel obstruction. S/p colectomy, temporary ostomy and ostomy reversal in [place].  2009   PLAN   *  From GI view, ok for discharge.  Will sign off   Azucena Freed  09/07/2015, 9:27 AM Pager: [phone]     Attending physician's note   I have taken an interval history, reviewed the chart and examined the patient. I agree with the Advanced Practitioner's note, impression and recommendations. Choledocholithiasis, post ERCP/sphincterotomy, stone extraction. LFTs significantly improved again today. Mild epigastric soreness without abdominal tenderness continues to improve. Advance diet to soft diet. Start  Protonix 40 mg po daily for 1 month. No ASA/NSAIDs for 2 weeks. OK for discharge today from GI standpoint. GI follow up as needed. Post hospital follow up with her PCP.  Lucio Edward, MD [facility] [phone] Mon-Fri 8a-5p [phone] after 5p, weekends, holidays

## 2015-09-07 NOTE — Care Management Important Message (Signed)
Important Message  Patient Details  Name: GRISELL HARP MRN: [id number] Date of Birth: 02-22-1946   Medicare Important Message Given:  Yes    Erenest Rasher, RN 09/07/2015, 12:03 PM

## 2015-09-07 NOTE — Discharge Summary (Signed)
Physician Discharge Summary  [name] [id number] DOB: Jan 22, 1946 DOA: 09/03/2015  PCP: Scarlette Calico, MD  Admit date: 09/03/2015 Discharge date: 09/07/2015  Time spent: > 35 minutes  Recommendations for Outpatient Follow-up:  1. Please recheck liver enzymes and cbc within the next 1 week 2. Also decide when to continue statin 3. Patient is to avoid NSAIDs or asa for 2 weeks   Discharge Diagnoses:  Principal Problem:   Acute cholangitis Active Problems:   COPD (chronic obstructive pulmonary disease) (HCC)   CAD (coronary artery disease), native coronary artery   Choledocholithiasis   RUQ pain   Elevated LFTs   Discharge Condition: stable  Diet recommendation: soft  Filed Weights   09/05/15 0451 09/06/15 0407 09/07/15 0554  Weight: 58.8 kg (129 lb 10.1 oz) 57.38 kg (126 lb 8 oz) 55.3 kg (121 lb 14.6 oz)    History of present illness:  69 y/o with history of cad s/p stenting, copd, pulmonary hypertension, dyslipidemia who presented with common bile duct stone.  Hospital Course:  Acute cholangitis - As per GI recommendations will trend liver enzymes and observe post ercp - ERCP on 09/05/15 pt is s/p single stone removal from bile duct using balloon extraction - ok for d/c from GI standpoint. Patient to avoid aspirin for 2 wks per GI recs  Anxiety - stable on home regimen anxiolytic  Active Problems:  COPD (chronic obstructive pulmonary disease) (HCC) - stable continue current medication regimen   CAD (coronary artery disease), native coronary artery - stable no chest pain reported   Choledocholithiasis - s/p ERCP and stone removal - d/c on oxy IR for residual pain control   Elevated LFTs - Liver enzymes still elevated above normal limits - will discontinue statin while liver enzymes elevated. Defer to pcp when to continue   Procedures: ERCP on 09/05/15 pt is s/p single stone removal from bile duct using balloon  extraction  Consultations:  GI  Discharge Exam: Filed Vitals:   09/06/15 2026 09/07/15 0554  BP: 130/76 139/80  Pulse: 78 97  Temp: 98.3 F (36.8 C) 97.7 F (36.5 C)  Resp: 18 18    General: Pt in nad, alert and awake Cardiovascular: rrr, no mrg Respiratory: cta bl, no wheezes  Discharge Instructions   Discharge Instructions    Call MD for:  persistant nausea and vomiting    Complete by:  As directed      Call MD for:  severe uncontrolled pain    Complete by:  As directed      Call MD for:  temperature >100.4    Complete by:  As directed      Diet - low sodium heart healthy    Complete by:  As directed      Discharge instructions    Complete by:  As directed   Due to elevated liver enzymes I will hold your Statin. Please discuss with your cardiologist or primary care physician when you can restart you statin. As mentioned by GI do not ingest aspirin or any NSAID (if not sure ask your pharmacist or pcp) for the next 2 weeks.  I also recommend you have your cbc rechecked within the next 1 week. Recommend you call your primary care physician 09/08/15 to set up appointment.     Increase activity slowly    Complete by:  As directed           Current Discharge Medication List    START taking these medications   Details  oxyCODONE (OXY IR/ROXICODONE) 5 MG immediate release tablet Take 1 tablet (5 mg total) by mouth every 6 (six) hours as needed for moderate pain. Qty: 30 tablet, Refills: 0      CONTINUE these medications which have CHANGED   Details  aspirin 325 MG EC tablet Take 1 tablet (325 mg total) by mouth every morning.      CONTINUE these medications which have NOT CHANGED   Details  albuterol (PROVENTIL HFA;VENTOLIN HFA) 108 (90 BASE) MCG/ACT inhaler Inhale 2 puffs into the lungs every 6 (six) hours as needed for wheezing or shortness of breath. 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     COMBIVENT RESPIMAT 20-100 MCG/ACT AERS respimat INHALE 1 PUFF INTO THE LUNGS EVERY 6 (SIX) HOURS. Qty: 4 g, Refills: 5    furosemide (LASIX) 40 MG tablet Take 1 tablet (40 mg total) by mouth 2 (two) times daily. Qty: 180 tablet, Refills: 0    KLOR-CON M20 20 MEQ tablet TAKE 1 TABLET EVERY DAY Qty: 180 tablet, Refills: 3    lactulose, encephalopathy, (CHRONULAC) 10 GM/15ML SOLN Take 30 mLs (20 g total) by mouth 3 (three) times daily as needed (Constipation). Qty: 1892 mL, Refills: 1    montelukast (SINGULAIR) 10 MG tablet Take 1 tablet (10 mg total) by mouth at bedtime. Qty: 30 tablet, Refills: 11    Multiple Vitamins-Minerals (MULTIVITAMIN WITH MINERALS) tablet Take 1 tablet by mouth every morning.     nortriptyline (PAMELOR) 25 MG capsule Take 2 capsules (50 mg total) by mouth at bedtime. Qty: 60 capsule, Refills: 11    Omega 3 1200 MG CAPS Take 1,200 mg by mouth every morning.     ondansetron (ZOFRAN) 8 MG tablet Take 1 tablet (8 mg total) by mouth 2 (two) times daily as needed for nausea or vomiting. 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 Uses when needed during the day, uses continuous throughout the night    pantoprazole (PROTONIX) 40 MG tablet Take 1 tablet (40 mg total) by mouth every morning. Qty: 90 tablet, Refills: 1    SYMBICORT 160-4.5 MCG/ACT inhaler INHALE 2 PUFFS INTO THE LUNGS 2 (TWO) TIMES DAILY. Qty: 30 Inhaler, Refills: 3      STOP taking  these medications     aspirin-acetaminophen-caffeine (EXCEDRIN MIGRAINE) 250-250-65 MG per tablet      diclofenac sodium (VOLTAREN) 1 % GEL      ipratropium-albuterol (DUONEB) 0.5-2.5 (3) MG/3ML SOLN      Pitavastatin Calcium (LIVALO) 2 MG TABS      Pseudoeph-Doxylamine-DM-APAP (NYQUIL PO)        Allergies  Allergen Reactions  . Ceftriaxone Anaphylaxis and Other (See Comments)    *ROCEPHIN*  "Blow up like a balloon"  . Hydroxyzine Shortness Of Breath and Other (See Comments)    Pt states med make her light headed, get sob sxs  . Lexapro [Escitalopram] Other (See Comments)  Pt states med make her dizzy      The results of significant diagnostics from this hospitalization (including imaging, microbiology, ancillary and laboratory) are listed below for reference.    Significant Diagnostic Studies: Dg Chest 2 View  09/03/2015  CLINICAL DATA:  69 year old female with abdominal pain and chest pain EXAM: CHEST  2 VIEW COMPARISON:  Chest radiograph dated 07/18/2014 and abdominal CT dated 01/01/2015 FINDINGS: Two views of the chest demonstrate emphysematous changes of the lungs with mild diffuse interstitial prominence. There is blunting of the costophrenic angles which may represent small pleural effusions. No focal consolidation or pneumothorax. Stable cardiac silhouette. The osseous structures appear unremarkable. Right axillary surgical clips noted. IMPRESSION: Mild diffuse interstitial prominence.  No focal consolidation. Electronically Signed   By: Anner Crete M.D.   On: 09/03/2015 23:44   Ct Abdomen Pelvis W Contrast  09/04/2015  CLINICAL DATA:  Acute onset of upper abdominal pain, radiating to the back. Elevated lipase. Initial encounter. EXAM: CT ABDOMEN AND PELVIS WITH CONTRAST TECHNIQUE: Multidetector CT imaging of the abdomen and pelvis was performed using the standard protocol following bolus administration of intravenous contrast. CONTRAST:  85mL OMNIPAQUE IOHEXOL 300  MG/ML  SOLN COMPARISON:  CT of the abdomen and pelvis from 01/30/2012, and right upper quadrant ultrasound performed earlier today at 1:18 a.m. FINDINGS: The visualized lung bases are clear. Scattered coronary artery calcifications are seen. There is diffuse prominence of the intrahepatic biliary ducts, with prominence of the common bile duct to 1.4 cm. Surrounding soft tissue inflammation is noted. The patient is status post cholecystectomy, with clips noted along the gallbladder fossa. A 7 mm stone is noted at the distal common bile duct, stable from 2013. Though this may still be within normal limits, the mild soft tissue edema about the porta hepatis raises question for cholangitis. The pancreas and adrenal glands are unremarkable. The kidneys are unremarkable in appearance. There is no evidence of hydronephrosis. No renal or ureteral stones are seen. No perinephric stranding is appreciated. No free fluid is identified. The proximal small bowel is unremarkable in appearance. The stomach is within normal limits. No acute vascular abnormalities are seen. Scattered calcification is noted along the abdominal aorta and its branches. There is minimal ectasia of the distal abdominal aorta. A moderate anterior abdominal wall hernia is noted, containing a relatively long segment of small bowel. There is no evidence for obstruction. Postoperative change is noted along the ascending colon. The remaining colon is grossly unremarkable in appearance. The bladder is moderately distended and grossly remarkable. The uterus is unremarkable in appearance. The ovaries are grossly symmetric. No suspicious adnexal masses are seen. No inguinal lymphadenopathy is seen. No acute osseous abnormalities are identified. Facet disease noted along the lumbar spine, with mild grade 1 anterolisthesis of L4 on L5. There is mild chronic loss of height at L1 and L3. IMPRESSION: 1. Diffuse prominence of the intrahepatic biliary ducts, with  prominence of the common bile duct to 1.4 cm. This is new from 2013, with new surrounding soft tissue inflammation at the porta hepatis. This raises question for cholangitis. Would correlate for associated symptoms. 2. 7 mm stone at the distal common bile duct is stable from 2013. 3. Scattered coronary artery calcifications seen. 4. Scattered calcification along the abdominal aorta and its branches. 5. Moderate anterior abdominal wall hernia, containing a relatively long segment of small bowel. No evidence for obstruction. Electronically Signed   By: Garald Balding M.D.   On: 09/04/2015 02:40  Mr 3d Recon At Scanner  09/04/2015  CLINICAL DATA:  69 year old female with history of severe abdominal pain yesterday evening. CT scan demonstrated findings concerning for potential cholangitis, and stone in the distal common bile duct. EXAM: MRI ABDOMEN WITHOUT AND WITH CONTRAST (INCLUDING MRCP) TECHNIQUE: Multiplanar multisequence MR imaging of the abdomen was performed both before and after the administration of intravenous contrast. Heavily T2-weighted images of the biliary and pancreatic ducts were obtained, and three-dimensional MRCP images were rendered by post processing. CONTRAST:  58mL MULTIHANCE GADOBENATE DIMEGLUMINE 529 MG/ML IV SOLN COMPARISON:  CT of the abdomen and pelvis 09/04/2015. FINDINGS: Lower chest: Lipomatous hypertrophy of the interatrial septum. Otherwise, unremarkable. Hepatobiliary: Mild diffuse increased T2 signal intensity in a periportal distribution throughout the liver. MRCP images demonstrate minimal prominence of the intrahepatic bile ducts. Common bile duct is mildly dilated measuring 8 mm within the porta hepatis. In addition, there is a 8 mm filling defect in the distal common bile duct shortly before the ampulla, compatible with a retained ductal stone. No suspicious cystic or solid hepatic lesions are identified. No significant periportal enhancement identified on post gadolinium  images. Status post cholecystectomy. Pancreas: No pancreatic mass. No pancreatic ductal dilatation. No pancreatic or peripancreatic fluid or inflammatory changes. Spleen: Spleen is normal in appearance. Adrenals/Urinary Tract: Focal area of cortical thinning in the posterior aspect of the upper pole of left kidney, most compatible with chronic post infectious or inflammatory scarring. Sub cm lesions in the kidneys bilaterally are low T1 signal intensity, high T2 signal intensity, and do not enhance, compatible with tiny simple cysts, measuring up to 9 mm in the lower pole of the right kidney. No aggressive renal lesions identified. No hydroureteronephrosis in the visualized abdomen. Stomach/Bowel: Visualized portions are unremarkable. Vascular/Lymphatic: Atherosclerosis in the visualized abdominal vasculature, without evidence of aneurysm. No lymphadenopathy noted in the abdomen. Other: No significant volume of ascites in the visualized peritoneal cavity. Musculoskeletal: No aggressive osseous lesions are identified in the visualized portions of the peritoneal cavity. IMPRESSION: 1. 8 mm filling defect in the distal common bile duct shortly before the ampulla, compatible with a retained ductal stone. This appears to be a chronic finding and has been present on prior studies dating back to 01/30/2012. 2. While there is minimal intrahepatic biliary ductal prominence and slight dilatation of the common bile duct, these findings are commonly seen in the setting of benign post cholecystectomy physiology. 3. There is a small amount of mild periportal edema on today's examination. No periportal enhancement to suggest cholangitis at this time. 4. Atherosclerosis. 5. Additional incidental findings, as above. Electronically Signed   By: Vinnie Langton M.D.   On: 09/04/2015 15:22   Dg Ercp With Sphincterotomy  09/05/2015  CLINICAL DATA:  Sphincterotomy EXAM: ERCP TECHNIQUE: Multiple spot images obtained with the  fluoroscopic device and submitted for interpretation post-procedure. FLUOROSCOPY TIME:  Radiation Exposure Index (as provided by the fluoroscopic device): 17.89 If the device does not provide the exposure index: Fluoroscopy Time:  1 minutes and 54 seconds Number of Acquired Images:  6 COMPARISON:  None. FINDINGS: Images demonstrate contrast filling the biliary tree and balloon stone retrieval. IMPRESSION: See above. These images were submitted for radiologic interpretation only. Please see the procedural report for the amount of contrast and the fluoroscopy time utilized. Electronically Signed   By: Marybelle Killings M.D.   On: 09/05/2015 10:50   Mr Jeananne Rama W/wo Cm/mrcp  09/04/2015  CLINICAL DATA:  69 year old female with history of severe abdominal pain  yesterday evening. CT scan demonstrated findings concerning for potential cholangitis, and stone in the distal common bile duct. EXAM: MRI ABDOMEN WITHOUT AND WITH CONTRAST (INCLUDING MRCP) TECHNIQUE: Multiplanar multisequence MR imaging of the abdomen was performed both before and after the administration of intravenous contrast. Heavily T2-weighted images of the biliary and pancreatic ducts were obtained, and three-dimensional MRCP images were rendered by post processing. CONTRAST:  57mL MULTIHANCE GADOBENATE DIMEGLUMINE 529 MG/ML IV SOLN COMPARISON:  CT of the abdomen and pelvis 09/04/2015. FINDINGS: Lower chest: Lipomatous hypertrophy of the interatrial septum. Otherwise, unremarkable. Hepatobiliary: Mild diffuse increased T2 signal intensity in a periportal distribution throughout the liver. MRCP images demonstrate minimal prominence of the intrahepatic bile ducts. Common bile duct is mildly dilated measuring 8 mm within the porta hepatis. In addition, there is a 8 mm filling defect in the distal common bile duct shortly before the ampulla, compatible with a retained ductal stone. No suspicious cystic or solid hepatic lesions are identified. No significant periportal  enhancement identified on post gadolinium images. Status post cholecystectomy. Pancreas: No pancreatic mass. No pancreatic ductal dilatation. No pancreatic or peripancreatic fluid or inflammatory changes. Spleen: Spleen is normal in appearance. Adrenals/Urinary Tract: Focal area of cortical thinning in the posterior aspect of the upper pole of left kidney, most compatible with chronic post infectious or inflammatory scarring. Sub cm lesions in the kidneys bilaterally are low T1 signal intensity, high T2 signal intensity, and do not enhance, compatible with tiny simple cysts, measuring up to 9 mm in the lower pole of the right kidney. No aggressive renal lesions identified. No hydroureteronephrosis in the visualized abdomen. Stomach/Bowel: Visualized portions are unremarkable. Vascular/Lymphatic: Atherosclerosis in the visualized abdominal vasculature, without evidence of aneurysm. No lymphadenopathy noted in the abdomen. Other: No significant volume of ascites in the visualized peritoneal cavity. Musculoskeletal: No aggressive osseous lesions are identified in the visualized portions of the peritoneal cavity. IMPRESSION: 1. 8 mm filling defect in the distal common bile duct shortly before the ampulla, compatible with a retained ductal stone. This appears to be a chronic finding and has been present on prior studies dating back to 01/30/2012. 2. While there is minimal intrahepatic biliary ductal prominence and slight dilatation of the common bile duct, these findings are commonly seen in the setting of benign post cholecystectomy physiology. 3. There is a small amount of mild periportal edema on today's examination. No periportal enhancement to suggest cholangitis at this time. 4. Atherosclerosis. 5. Additional incidental findings, as above. Electronically Signed   By: Vinnie Langton M.D.   On: 09/04/2015 15:22   US Abdomen Limited Ruq  09/04/2015  CLINICAL DATA:  Acute onset of generalized abdominal pain.  Elevated LFTs and elevated lipase. Initial encounter. EXAM: US ABDOMEN LIMITED - RIGHT UPPER QUADRANT COMPARISON:  CT of the abdomen and pelvis performed 01/30/2012, and abdominal ultrasound performed 08/01/2011 FINDINGS: Gallbladder: Status post cholecystectomy.  No retained stones seen. Common bile duct: Diameter: 1.1 cm, within normal limits status post cholecystectomy. Liver: No focal lesion identified. Within normal limits in parenchymal echogenicity. IMPRESSION: Unremarkable ultrasound of the right upper quadrant. Status post cholecystectomy. Electronically Signed   By: Garald Balding M.D.   On: 09/04/2015 01:43    Microbiology: Recent Results (from the past 240 hour(s))  Culture, blood (routine x 2)     Status: None (Preliminary result)   Collection Time: 09/04/15  3:08 AM  Result Value Ref Range Status   Specimen Description BLOOD LEFT FOREARM  Final   Special Requests BOTTLES  DRAWN AEROBIC AND ANAEROBIC 5 ML  Final   Culture   Final    NO GROWTH 3 DAYS Performed at [facility]    Report Status PENDING  Incomplete  Culture, blood (routine x 2)     Status: None (Preliminary result)   Collection Time: 09/04/15  3:15 AM  Result Value Ref Range Status   Specimen Description BLOOD LEFT ANTECUBITAL  Final   Special Requests BOTTLES DRAWN AEROBIC AND ANAEROBIC 5ML  Final   Culture   Final    NO GROWTH 3 DAYS Performed at [facility]    Report Status PENDING  Incomplete     Labs: Basic Metabolic Panel:  Recent Labs Lab 09/03/15 2333 09/04/15 0634 09/06/15 0500 09/07/15 0510  NA 140 135 136 138  K 3.9 3.8 4.4 4.0  CL 97* 96* 101 100*  CO2 34* 31 32 31  GLUCOSE 135* 100* 103* 113*  BUN 16 14 6 7   CREATININE 1.04* 0.77 0.74 0.56  CALCIUM 9.9 9.3 9.4 9.8   Liver Function Tests:  Recent Labs Lab 09/03/15 2333 09/04/15 0634 09/06/15 0500 09/07/15 0510  AST 334* 251* 77* 43*  ALT 250* 226* 128* 91*  ALKPHOS 756* 603* 462* 385*  BILITOT 0.9 1.6* 0.8 0.7   PROT 8.0 7.5 7.3 7.1  ALBUMIN 3.6 3.1* 3.2* 3.1*    Recent Labs Lab 09/03/15 2333 09/04/15 0634 09/07/15 0510  LIPASE 132* 76* 28   No results for input(s): AMMONIA in the last 168 hours. CBC:  Recent Labs Lab 09/03/15 2333 09/04/15 0634 09/07/15 0510  WBC 10.0 8.5 11.8*  NEUTROABS  --  6.5  --   HGB 12.8 11.6* 12.2  HCT 39.0 36.4 38.3  MCV 96.3 97.8 97.7  PLT 280 251 228   Cardiac Enzymes:  Recent Labs Lab 09/04/15 0634 09/04/15 1230 09/04/15 1850  TROPONINI <0.03 <0.03 <0.03   BNP: BNP (last 3 results)  Recent Labs  09/03/15 2333  BNP 253.4*    ProBNP (last 3 results) No results for input(s): PROBNP in the last 8760 hours.  CBG:  Recent Labs Lab 09/06/15 1132 09/06/15 1644 09/06/15 2031 09/07/15 0014 09/07/15 0724  GLUCAP 100* 97 109* 111* 102*     Signed:  Velvet Bathe  [facility] 09/07/2015, 11:46 AM

## 2015-09-08 ENCOUNTER — Encounter (HOSPITAL_COMMUNITY): Payer: Self-pay | Admitting: Gastroenterology

## 2015-09-09 LAB — CULTURE, BLOOD (ROUTINE X 2)
Culture: NO GROWTH
Culture: NO GROWTH

## 2015-09-22 ENCOUNTER — Other Ambulatory Visit: Payer: Self-pay | Admitting: Internal Medicine

## 2015-09-23 ENCOUNTER — Other Ambulatory Visit: Payer: Self-pay

## 2015-09-23 DIAGNOSIS — Z9889 Other specified postprocedural states: Secondary | ICD-10-CM

## 2015-09-23 DIAGNOSIS — Z853 Personal history of malignant neoplasm of breast: Secondary | ICD-10-CM

## 2015-09-23 DIAGNOSIS — Z1231 Encounter for screening mammogram for malignant neoplasm of breast: Secondary | ICD-10-CM

## 2015-09-30 ENCOUNTER — Other Ambulatory Visit: Payer: Self-pay | Admitting: Internal Medicine

## 2015-10-01 ENCOUNTER — Telehealth: Payer: Self-pay

## 2015-10-01 DIAGNOSIS — J438 Other emphysema: Secondary | ICD-10-CM

## 2015-10-01 DIAGNOSIS — J42 Unspecified chronic bronchitis: Secondary | ICD-10-CM

## 2015-10-01 NOTE — Telephone Encounter (Signed)
Received faxed from [facility] requesting documentation and tried/failed alternatives to symbicort.   Faxed information back to [place].

## 2015-10-02 ENCOUNTER — Other Ambulatory Visit: Payer: Self-pay

## 2015-10-02 MED ORDER — FLUTICASONE FUROATE-VILANTEROL 200-25 MCG/INH IN AEPB: 1.0000 | INHALATION_SPRAY | Freq: Every day | RESPIRATORY_TRACT | Status: DC

## 2015-10-02 MED ORDER — ALBUTEROL SULFATE HFA 108 (90 BASE) MCG/ACT IN AERS: 2.0000 | INHALATION_SPRAY | RESPIRATORY_TRACT | Status: DC | PRN

## 2015-10-02 NOTE — Telephone Encounter (Signed)
Changed to breo 

## 2015-10-02 NOTE — Telephone Encounter (Signed)
Pharmacy request at this qu.

## 2015-10-02 NOTE — Telephone Encounter (Signed)
Or should the below be sent to pulmonary?

## 2015-10-02 NOTE — Addendum Note (Signed)
Addended by: [name] on: 10/02/2015 11:09 AM   Modules accepted: Orders, Medications

## 2015-10-02 NOTE — Telephone Encounter (Signed)
PA for symbicort was denied.   The formulary alternatives are Advair and Breo Ellipta Please advise if I need to do an appeal or if an alternative should/will be sent in.

## 2015-10-02 NOTE — Telephone Encounter (Signed)
Pt stated the she was not able to use the breo or advair due to the powder causes bronchiospasm.

## 2015-10-06 NOTE — Telephone Encounter (Signed)
LMOM regarding same about PA for symbicort. Breo and Advair are not able to be used.

## 2015-10-14 NOTE — Telephone Encounter (Signed)
Appeal for symbicort has been started and sent as expedited.

## 2015-10-16 ENCOUNTER — Ambulatory Visit (INDEPENDENT_AMBULATORY_CARE_PROVIDER_SITE_OTHER)
Admission: RE | Admit: 2015-10-16 | Discharge: 2015-10-16 | Disposition: A | Discharge status: Home / Self Care | Payer: Medicare Other | Source: Ambulatory Visit | Attending: Internal Medicine | Admitting: Internal Medicine

## 2015-10-16 ENCOUNTER — Other Ambulatory Visit (INDEPENDENT_AMBULATORY_CARE_PROVIDER_SITE_OTHER): Payer: Medicare Other

## 2015-10-16 ENCOUNTER — Ambulatory Visit (INDEPENDENT_AMBULATORY_CARE_PROVIDER_SITE_OTHER): Payer: Medicare Other | Admitting: Internal Medicine

## 2015-10-16 ENCOUNTER — Encounter: Payer: Self-pay | Admitting: Internal Medicine

## 2015-10-16 VITALS — BP 120/80 | HR 82 | Temp 98.1°F | Resp 20 | Ht 62.0 in | Wt 123.0 lb

## 2015-10-16 DIAGNOSIS — R945 Abnormal results of liver function studies: Secondary | ICD-10-CM

## 2015-10-16 DIAGNOSIS — R739 Hyperglycemia, unspecified: Secondary | ICD-10-CM | POA: Diagnosis not present

## 2015-10-16 DIAGNOSIS — E785 Hyperlipidemia, unspecified: Secondary | ICD-10-CM

## 2015-10-16 DIAGNOSIS — Z720 Tobacco use: Secondary | ICD-10-CM

## 2015-10-16 DIAGNOSIS — R7989 Other specified abnormal findings of blood chemistry: Secondary | ICD-10-CM | POA: Diagnosis not present

## 2015-10-16 DIAGNOSIS — M810 Age-related osteoporosis without current pathological fracture: Secondary | ICD-10-CM | POA: Diagnosis not present

## 2015-10-16 DIAGNOSIS — I2510 Atherosclerotic heart disease of native coronary artery without angina pectoris: Secondary | ICD-10-CM

## 2015-10-16 DIAGNOSIS — J42 Unspecified chronic bronchitis: Secondary | ICD-10-CM | POA: Diagnosis not present

## 2015-10-16 DIAGNOSIS — J438 Other emphysema: Secondary | ICD-10-CM | POA: Diagnosis not present

## 2015-10-16 DIAGNOSIS — Z0000 Encounter for general adult medical examination without abnormal findings: Secondary | ICD-10-CM | POA: Diagnosis not present

## 2015-10-16 LAB — CBC WITH DIFFERENTIAL/PLATELET
Basophils Absolute: 0 10*3/uL (ref 0.0–0.1)
Basophils Relative: 0.6 % (ref 0.0–3.0)
Eosinophils Absolute: 0.1 10*3/uL (ref 0.0–0.7)
Eosinophils Relative: 1.5 % (ref 0.0–5.0)
HCT: 40.9 % (ref 36.0–46.0)
Hemoglobin: 13.4 g/dL (ref 12.0–15.0)
Lymphocytes Relative: 19 % (ref 12.0–46.0)
Lymphs Abs: 1.5 10*3/uL (ref 0.7–4.0)
MCHC: 32.9 g/dL (ref 30.0–36.0)
MCV: 94.6 fl (ref 78.0–100.0)
Monocytes Absolute: 0.5 10*3/uL (ref 0.1–1.0)
Monocytes Relative: 6 % (ref 3.0–12.0)
Neutro Abs: 5.9 10*3/uL (ref 1.4–7.7)
Neutrophils Relative %: 72.9 % (ref 43.0–77.0)
Platelets: 322 10*3/uL (ref 150.0–400.0)
RBC: 4.32 Mil/uL (ref 3.87–5.11)
RDW: 14.1 % (ref 11.5–15.5)
WBC: 8.1 10*3/uL (ref 4.0–10.5)

## 2015-10-16 LAB — BASIC METABOLIC PANEL
BUN: 17 mg/dL (ref 6–23)
CO2: 38 mEq/L — ABNORMAL HIGH (ref 19–32)
Calcium: 11.1 mg/dL — ABNORMAL HIGH (ref 8.4–10.5)
Chloride: 95 mEq/L — ABNORMAL LOW (ref 96–112)
Creatinine, Ser: 1.04 mg/dL (ref 0.40–1.20)
GFR: 55.71 mL/min — ABNORMAL LOW (ref >60.00)
Glucose, Bld: 94 mg/dL (ref 70–99)
Potassium: 4.8 mEq/L (ref 3.5–5.1)
Sodium: 139 mEq/L (ref 135–145)

## 2015-10-16 LAB — HEPATIC FUNCTION PANEL
ALT: 19 U/L (ref 0–35)
AST: 26 U/L (ref 0–37)
Albumin: 4.1 g/dL (ref 3.5–5.2)
Alkaline Phosphatase: 78 U/L (ref 39–117)
Bilirubin, Direct: 0.1 mg/dL (ref 0.0–0.3)
Total Bilirubin: 0.4 mg/dL (ref 0.2–1.2)
Total Protein: 8.4 g/dL — ABNORMAL HIGH (ref 6.0–8.3)

## 2015-10-16 LAB — HEMOGLOBIN A1C: Hgb A1c MFr Bld: 5.3 % (ref 4.6–6.5)

## 2015-10-16 LAB — TSH: TSH: 1.18 u[IU]/mL (ref 0.35–4.50)

## 2015-10-16 MED ORDER — SYMBICORT 160-4.5 MCG/ACT IN AERO: 2.0000 | INHALATION_SPRAY | Freq: Two times a day (BID) | RESPIRATORY_TRACT | Status: DC

## 2015-10-16 MED ORDER — ALBUTEROL SULFATE HFA 108 (90 BASE) MCG/ACT IN AERS: 2.0000 | INHALATION_SPRAY | RESPIRATORY_TRACT | Status: DC | PRN

## 2015-10-16 NOTE — Progress Notes (Signed)
Pre visit review using our clinic review tool, if applicable. No additional management support is needed unless otherwise documented below in the visit note. 

## 2015-10-16 NOTE — Telephone Encounter (Signed)
Received a fax that it was denied. Pt is in today and stated that it is approved.

## 2015-10-16 NOTE — Patient Instructions (Signed)
Preventive Care for Adults, Female A healthy lifestyle and preventive care can promote health and wellness. Preventive health guidelines for women include the following key practices.  A routine yearly physical is a good way to check with your health care provider about your health and preventive screening. It is a chance to share any concerns and updates on your health and to receive a thorough exam.  Visit your dentist for a routine exam and preventive care every 6 months. Brush your teeth twice a day and floss once a day. Good oral hygiene prevents tooth decay and gum disease.  The frequency of eye exams is based on your age, health, family medical history, use of contact lenses, and other factors. Follow your health care provider's recommendations for frequency of eye exams.  Eat a healthy diet. Foods like vegetables, fruits, whole grains, low-fat dairy products, and lean protein foods contain the nutrients you need without too many calories. Decrease your intake of foods high in solid fats, added sugars, and salt. Eat the right amount of calories for you.Get information about a proper diet from your health care provider, if necessary.  Regular physical exercise is one of the most important things you can do for your health. Most adults should get at least 150 minutes of moderate-intensity exercise (any activity that increases your heart rate and causes you to sweat) each week. In addition, most adults need muscle-strengthening exercises on 2 or more days a week.  Maintain a healthy weight. The body mass index (BMI) is a screening tool to identify possible weight problems. It provides an estimate of body fat based on height and weight. Your health care provider can find your BMI and can help you achieve or maintain a healthy weight.For adults 20 years and older:  A BMI below 18.5 is considered underweight.  A BMI of 18.5 to 24.9 is normal.  A BMI of 25 to 29.9 is considered overweight.  A  BMI of 30 and above is considered obese.  Maintain normal blood lipids and cholesterol levels by exercising and minimizing your intake of saturated fat. Eat a balanced diet with plenty of fruit and vegetables. Blood tests for lipids and cholesterol should begin at age 45 and be repeated every 5 years. If your lipid or cholesterol levels are high, you are over 50, or you are at high risk for heart disease, you may need your cholesterol levels checked more frequently.Ongoing high lipid and cholesterol levels should be treated with medicines if diet and exercise are not working.  If you smoke, find out from your health care provider how to quit. If you do not use tobacco, do not start.  Lung cancer screening is recommended for adults aged 45-80 years who are at high risk for developing lung cancer because of a history of smoking. A yearly low-dose CT scan of the lungs is recommended for people who have at least a 30-pack-year history of smoking and are a current smoker or have quit within the past 15 years. A pack year of smoking is smoking an average of 1 pack of cigarettes a day for 1 year (for example: 1 pack a day for 30 years or 2 packs a day for 15 years). Yearly screening should continue until the smoker has stopped smoking for at least 15 years. Yearly screening should be stopped for people who develop a health problem that would prevent them from having lung cancer treatment.  If you are pregnant, do not drink alcohol. If you are  breastfeeding, be very cautious about drinking alcohol. If you are not pregnant and choose to drink alcohol, do not have more than 1 drink per day. One drink is considered to be 12 ounces (355 mL) of beer, 5 ounces (148 mL) of wine, or 1.5 ounces (44 mL) of liquor.  Avoid use of street drugs. Do not share needles with anyone. Ask for help if you need support or instructions about stopping the use of drugs.  High blood pressure causes heart disease and increases the risk  of stroke. Your blood pressure should be checked at least every 1 to 2 years. Ongoing high blood pressure should be treated with medicines if weight loss and exercise do not work.  If you are 55-79 years old, ask your health care provider if you should take aspirin to prevent strokes.  Diabetes screening is done by taking a blood sample to check your blood glucose level after you have not eaten for a certain period of time (fasting). If you are not overweight and you do not have risk factors for diabetes, you should be screened once every 3 years starting at age 45. If you are overweight or obese and you are 40-70 years of age, you should be screened for diabetes every year as part of your cardiovascular risk assessment.  Breast cancer screening is essential preventive care for women. You should practice "breast self-awareness." This means understanding the normal appearance and feel of your breasts and may include breast self-examination. Any changes detected, no matter how small, should be reported to a health care provider. Women in their 20s and 30s should have a clinical breast exam (CBE) by a health care provider as part of a regular health exam every 1 to 3 years. After age 40, women should have a CBE every year. Starting at age 40, women should consider having a mammogram (breast X-ray test) every year. Women who have a family history of breast cancer should talk to their health care provider about genetic screening. Women at a high risk of breast cancer should talk to their health care providers about having an MRI and a mammogram every year.  Breast cancer gene (BRCA)-related cancer risk assessment is recommended for women who have family members with BRCA-related cancers. BRCA-related cancers include breast, ovarian, tubal, and peritoneal cancers. Having family members with these cancers may be associated with an increased risk for harmful changes (mutations) in the breast cancer genes BRCA1 and  BRCA2. Results of the assessment will determine the need for genetic counseling and BRCA1 and BRCA2 testing.  Your health care provider may recommend that you be screened regularly for cancer of the pelvic organs (ovaries, uterus, and vagina). This screening involves a pelvic examination, including checking for microscopic changes to the surface of your cervix (Pap test). You may be encouraged to have this screening done every 3 years, beginning at age 21.  For women ages 30-65, health care providers may recommend pelvic exams and Pap testing every 3 years, or they may recommend the Pap and pelvic exam, combined with testing for human papilloma virus (HPV), every 5 years. Some types of HPV increase your risk of cervical cancer. Testing for HPV may also be done on women of any age with unclear Pap test results.  Other health care providers may not recommend any screening for nonpregnant women who are considered low risk for pelvic cancer and who do not have symptoms. Ask your health care provider if a screening pelvic exam is right for   you.  If you have had past treatment for cervical cancer or a condition that could lead to cancer, you need Pap tests and screening for cancer for at least 20 years after your treatment. If Pap tests have been discontinued, your risk factors (such as having a new sexual partner) need to be reassessed to determine if screening should resume. Some women have medical problems that increase the chance of getting cervical cancer. In these cases, your health care provider may recommend more frequent screening and Pap tests.  Colorectal cancer can be detected and often prevented. Most routine colorectal cancer screening begins at the age of 50 years and continues through age 75 years. However, your health care provider may recommend screening at an earlier age if you have risk factors for colon cancer. On a yearly basis, your health care provider may provide home test kits to check  for hidden blood in the stool. Use of a small camera at the end of a tube, to directly examine the colon (sigmoidoscopy or colonoscopy), can detect the earliest forms of colorectal cancer. Talk to your health care provider about this at age 50, when routine screening begins. Direct exam of the colon should be repeated every 5-10 years through age 75 years, unless early forms of precancerous polyps or small growths are found.  People who are at an increased risk for hepatitis B should be screened for this virus. You are considered at high risk for hepatitis B if:  You were born in a country where hepatitis B occurs often. Talk with your health care provider about which countries are considered high risk.  Your parents were born in a high-risk country and you have not received a shot to protect against hepatitis B (hepatitis B vaccine).  You have HIV or AIDS.  You use needles to inject street drugs.  You live with, or have sex with, someone who has hepatitis B.  You get hemodialysis treatment.  You take certain medicines for conditions like cancer, organ transplantation, and autoimmune conditions.  Hepatitis C blood testing is recommended for all people born from 1945 through 1965 and any individual with known risks for hepatitis C.  Practice safe sex. Use condoms and avoid high-risk sexual practices to reduce the spread of sexually transmitted infections (STIs). STIs include gonorrhea, chlamydia, syphilis, trichomonas, herpes, HPV, and human immunodeficiency virus (HIV). Herpes, HIV, and HPV are viral illnesses that have no cure. They can result in disability, cancer, and death.  You should be screened for sexually transmitted illnesses (STIs) including gonorrhea and chlamydia if:  You are sexually active and are younger than 24 years.  You are older than 24 years and your health care provider tells you that you are at risk for this type of infection.  Your sexual activity has changed  since you were last screened and you are at an increased risk for chlamydia or gonorrhea. Ask your health care provider if you are at risk.  If you are at risk of being infected with HIV, it is recommended that you take a prescription medicine daily to prevent HIV infection. This is called preexposure prophylaxis (PrEP). You are considered at risk if:  You are sexually active and do not regularly use condoms or know the HIV status of your partner(s).  You take drugs by injection.  You are sexually active with a partner who has HIV.  Talk with your health care provider about whether you are at high risk of being infected with HIV. If   you choose to begin PrEP, you should first be tested for HIV. You should then be tested every 3 months for as long as you are taking PrEP.  Osteoporosis is a disease in which the bones lose minerals and strength with aging. This can result in serious bone fractures or breaks. The risk of osteoporosis can be identified using a bone density scan. Women ages 67 years and over and women at risk for fractures or osteoporosis should discuss screening with their health care providers. Ask your health care provider whether you should take a calcium supplement or vitamin D to reduce the rate of osteoporosis.  Menopause can be associated with physical symptoms and risks. Hormone replacement therapy is available to decrease symptoms and risks. You should talk to your health care provider about whether hormone replacement therapy is right for you.  Use sunscreen. Apply sunscreen liberally and repeatedly throughout the day. You should seek shade when your shadow is shorter than you. Protect yourself by wearing long sleeves, pants, a wide-brimmed hat, and sunglasses year round, whenever you are outdoors.  Once a month, do a whole body skin exam, using a mirror to look at the skin on your back. Tell your health care provider of new moles, moles that have irregular borders, moles that  are larger than a pencil eraser, or moles that have changed in shape or color.  Stay current with required vaccines (immunizations).  Influenza vaccine. All adults should be immunized every year.  Tetanus, diphtheria, and acellular pertussis (Td, Tdap) vaccine. Pregnant women should receive 1 dose of Tdap vaccine during each pregnancy. The dose should be obtained regardless of the length of time since the last dose. Immunization is preferred during the 27th-36th week of gestation. An adult who has not previously received Tdap or who does not know her vaccine status should receive 1 dose of Tdap. This initial dose should be followed by tetanus and diphtheria toxoids (Td) booster doses every 10 years. Adults with an unknown or incomplete history of completing a 3-dose immunization series with Td-containing vaccines should begin or complete a primary immunization series including a Tdap dose. Adults should receive a Td booster every 10 years.  Varicella vaccine. An adult without evidence of immunity to varicella should receive 2 doses or a second dose if she has previously received 1 dose. Pregnant females who do not have evidence of immunity should receive the first dose after pregnancy. This first dose should be obtained before leaving the health care facility. The second dose should be obtained 4-8 weeks after the first dose.  Human papillomavirus (HPV) vaccine. Females aged 13-26 years who have not received the vaccine previously should obtain the 3-dose series. The vaccine is not recommended for use in pregnant females. However, pregnancy testing is not needed before receiving a dose. If a female is found to be pregnant after receiving a dose, no treatment is needed. In that case, the remaining doses should be delayed until after the pregnancy. Immunization is recommended for any person with an immunocompromised condition through the age of 61 years if she did not get any or all doses earlier. During the  3-dose series, the second dose should be obtained 4-8 weeks after the first dose. The third dose should be obtained 24 weeks after the first dose and 16 weeks after the second dose.  Zoster vaccine. One dose is recommended for adults aged 30 years or older unless certain conditions are present.  Measles, mumps, and rubella (MMR) vaccine. Adults born  before 1957 generally are considered immune to measles and mumps. Adults born in 1957 or later should have 1 or more doses of MMR vaccine unless there is a contraindication to the vaccine or there is laboratory evidence of immunity to each of the three diseases. A routine second dose of MMR vaccine should be obtained at least 28 days after the first dose for students attending postsecondary schools, health care workers, or international travelers. People who received inactivated measles vaccine or an unknown type of measles vaccine during 1963-1967 should receive 2 doses of MMR vaccine. People who received inactivated mumps vaccine or an unknown type of mumps vaccine before 1979 and are at high risk for mumps infection should consider immunization with 2 doses of MMR vaccine. For females of childbearing age, rubella immunity should be determined. If there is no evidence of immunity, females who are not pregnant should be vaccinated. If there is no evidence of immunity, females who are pregnant should delay immunization until after pregnancy. Unvaccinated health care workers born before 1957 who lack laboratory evidence of measles, mumps, or rubella immunity or laboratory confirmation of disease should consider measles and mumps immunization with 2 doses of MMR vaccine or rubella immunization with 1 dose of MMR vaccine.  Pneumococcal 13-valent conjugate (PCV13) vaccine. When indicated, a person who is uncertain of his immunization history and has no record of immunization should receive the PCV13 vaccine. All adults 65 years of age and older should receive this  vaccine. An adult aged 19 years or older who has certain medical conditions and has not been previously immunized should receive 1 dose of PCV13 vaccine. This PCV13 should be followed with a dose of pneumococcal polysaccharide (PPSV23) vaccine. Adults who are at high risk for pneumococcal disease should obtain the PPSV23 vaccine at least 8 weeks after the dose of PCV13 vaccine. Adults older than 70 years of age who have normal immune system function should obtain the PPSV23 vaccine dose at least 1 year after the dose of PCV13 vaccine.  Pneumococcal polysaccharide (PPSV23) vaccine. When PCV13 is also indicated, PCV13 should be obtained first. All adults aged 65 years and older should be immunized. An adult younger than age 65 years who has certain medical conditions should be immunized. Any person who resides in a nursing home or long-term care facility should be immunized. An adult smoker should be immunized. People with an immunocompromised condition and certain other conditions should receive both PCV13 and PPSV23 vaccines. People with human immunodeficiency virus (HIV) infection should be immunized as soon as possible after diagnosis. Immunization during chemotherapy or radiation therapy should be avoided. Routine use of PPSV23 vaccine is not recommended for American Indians, [place] Natives, or people younger than 65 years unless there are medical conditions that require PPSV23 vaccine. When indicated, people who have unknown immunization and have no record of immunization should receive PPSV23 vaccine. One-time revaccination 5 years after the first dose of PPSV23 is recommended for people aged 19-64 years who have chronic kidney failure, nephrotic syndrome, asplenia, or immunocompromised conditions. People who received 1-2 doses of PPSV23 before age 65 years should receive another dose of PPSV23 vaccine at age 65 years or later if at least 5 years have passed since the previous dose. Doses of PPSV23 are not  needed for people immunized with PPSV23 at or after age 65 years.  Meningococcal vaccine. Adults with asplenia or persistent complement component deficiencies should receive 2 doses of quadrivalent meningococcal conjugate (MenACWY-D) vaccine. The doses should be obtained   at least 2 months apart. Microbiologists working with certain meningococcal bacteria, [place] recruits, people at risk during an outbreak, and people who travel to or live in countries with a high rate of meningitis should be immunized. A first-year college student up through age 34 years who is living in a residence hall should receive a dose if she did not receive a dose on or after her 16th birthday. Adults who have certain high-risk conditions should receive one or more doses of vaccine.  Hepatitis A vaccine. Adults who wish to be protected from this disease, have certain high-risk conditions, work with hepatitis A-infected animals, work in hepatitis A research labs, or travel to or work in countries with a high rate of hepatitis A should be immunized. Adults who were previously unvaccinated and who anticipate close contact with an international adoptee during the first 60 days after arrival in the [place] from a country with a high rate of hepatitis A should be immunized.  Hepatitis B vaccine. Adults who wish to be protected from this disease, have certain high-risk conditions, may be exposed to blood or other infectious body fluids, are household contacts or sex partners of hepatitis B positive people, are clients or workers in certain care facilities, or travel to or work in countries with a high rate of hepatitis B should be immunized.  Haemophilus influenzae type b (Hib) vaccine. A previously unvaccinated person with asplenia or sickle cell disease or having a scheduled splenectomy should receive 1 dose of Hib vaccine. Regardless of previous immunization, a recipient of a hematopoietic stem cell transplant should receive a  3-dose series 6-12 months after her successful transplant. Hib vaccine is not recommended for adults with HIV infection. Preventive Services / Frequency Ages 35 to 4 years  Blood pressure check.** / Every 3-5 years.  Lipid and cholesterol check.** / Every 5 years beginning at age 60.  Clinical breast exam.** / Every 3 years for women in their 71s and 10s.  BRCA-related cancer risk assessment.** / For women who have family members with a BRCA-related cancer (breast, ovarian, tubal, or peritoneal cancers).  Pap test.** / Every 2 years from ages 76 through 26. Every 3 years starting at age 61 through age 76 or 93 with a history of 3 consecutive normal Pap tests.  HPV screening.** / Every 3 years from ages 37 through ages 60 to 51 with a history of 3 consecutive normal Pap tests.  Hepatitis C blood test.** / For any individual with known risks for hepatitis C.  Skin self-exam. / Monthly.  Influenza vaccine. / Every year.  Tetanus, diphtheria, and acellular pertussis (Tdap, Td) vaccine.** / Consult your health care provider. Pregnant women should receive 1 dose of Tdap vaccine during each pregnancy. 1 dose of Td every 10 years.  Varicella vaccine.** / Consult your health care provider. Pregnant females who do not have evidence of immunity should receive the first dose after pregnancy.  HPV vaccine. / 3 doses over 6 months, if 93 and younger. The vaccine is not recommended for use in pregnant females. However, pregnancy testing is not needed before receiving a dose.  Measles, mumps, rubella (MMR) vaccine.** / You need at least 1 dose of MMR if you were born in 1957 or later. You may also need a 2nd dose. For females of childbearing age, rubella immunity should be determined. If there is no evidence of immunity, females who are not pregnant should be vaccinated. If there is no evidence of immunity, females who are  pregnant should delay immunization until after pregnancy.  Pneumococcal  13-valent conjugate (PCV13) vaccine.** / Consult your health care provider.  Pneumococcal polysaccharide (PPSV23) vaccine.** / 1 to 2 doses if you smoke cigarettes or if you have certain conditions.  Meningococcal vaccine.** / 1 dose if you are age 68 to 8 years and a Market researcher living in a residence hall, or have one of several medical conditions, you need to get vaccinated against meningococcal disease. You may also need additional booster doses.  Hepatitis A vaccine.** / Consult your health care provider.  Hepatitis B vaccine.** / Consult your health care provider.  Haemophilus influenzae type b (Hib) vaccine.** / Consult your health care provider. Ages 7 to 53 years  Blood pressure check.** / Every year.  Lipid and cholesterol check.** / Every 5 years beginning at age 25 years.  Lung cancer screening. / Every year if you are aged 11-80 years and have a 30-pack-year history of smoking and currently smoke or have quit within the past 15 years. Yearly screening is stopped once you have quit smoking for at least 15 years or develop a health problem that would prevent you from having lung cancer treatment.  Clinical breast exam.** / Every year after age 48 years.  BRCA-related cancer risk assessment.** / For women who have family members with a BRCA-related cancer (breast, ovarian, tubal, or peritoneal cancers).  Mammogram.** / Every year beginning at age 41 years and continuing for as long as you are in good health. Consult with your health care provider.  Pap test.** / Every 3 years starting at age 65 years through age 37 or 70 years with a history of 3 consecutive normal Pap tests.  HPV screening.** / Every 3 years from ages 72 years through ages 60 to 40 years with a history of 3 consecutive normal Pap tests.  Fecal occult blood test (FOBT) of stool. / Every year beginning at age 21 years and continuing until age 5 years. You may not need to do this test if you get  a colonoscopy every 10 years.  Flexible sigmoidoscopy or colonoscopy.** / Every 5 years for a flexible sigmoidoscopy or every 10 years for a colonoscopy beginning at age 35 years and continuing until age 48 years.  Hepatitis C blood test.** / For all people born from [birth year] through 1965 and any individual with known risks for hepatitis C.  Skin self-exam. / Monthly.  Influenza vaccine. / Every year.  Tetanus, diphtheria, and acellular pertussis (Tdap/Td) vaccine.** / Consult your health care provider. Pregnant women should receive 1 dose of Tdap vaccine during each pregnancy. 1 dose of Td every 10 years.  Varicella vaccine.** / Consult your health care provider. Pregnant females who do not have evidence of immunity should receive the first dose after pregnancy.  Zoster vaccine.** / 1 dose for adults aged 30 years or older.  Measles, mumps, rubella (MMR) vaccine.** / You need at least 1 dose of MMR if you were born in 1957 or later. You may also need a second dose. For females of childbearing age, rubella immunity should be determined. If there is no evidence of immunity, females who are not pregnant should be vaccinated. If there is no evidence of immunity, females who are pregnant should delay immunization until after pregnancy.  Pneumococcal 13-valent conjugate (PCV13) vaccine.** / Consult your health care provider.  Pneumococcal polysaccharide (PPSV23) vaccine.** / 1 to 2 doses if you smoke cigarettes or if you have certain conditions.  Meningococcal vaccine.** /  Consult your health care provider.  Hepatitis A vaccine.** / Consult your health care provider.  Hepatitis B vaccine.** / Consult your health care provider.  Haemophilus influenzae type b (Hib) vaccine.** / Consult your health care provider. Ages 64 years and over  Blood pressure check.** / Every year.  Lipid and cholesterol check.** / Every 5 years beginning at age 23 years.  Lung cancer screening. / Every year if you  are aged 16-80 years and have a 30-pack-year history of smoking and currently smoke or have quit within the past 15 years. Yearly screening is stopped once you have quit smoking for at least 15 years or develop a health problem that would prevent you from having lung cancer treatment.  Clinical breast exam.** / Every year after age 74 years.  BRCA-related cancer risk assessment.** / For women who have family members with a BRCA-related cancer (breast, ovarian, tubal, or peritoneal cancers).  Mammogram.** / Every year beginning at age 44 years and continuing for as long as you are in good health. Consult with your health care provider.  Pap test.** / Every 3 years starting at age 58 years through age 22 or 39 years with 3 consecutive normal Pap tests. Testing can be stopped between 65 and 70 years with 3 consecutive normal Pap tests and no abnormal Pap or HPV tests in the past 10 years.  HPV screening.** / Every 3 years from ages 64 years through ages 70 or 61 years with a history of 3 consecutive normal Pap tests. Testing can be stopped between 65 and 70 years with 3 consecutive normal Pap tests and no abnormal Pap or HPV tests in the past 10 years.  Fecal occult blood test (FOBT) of stool. / Every year beginning at age 40 years and continuing until age 27 years. You may not need to do this test if you get a colonoscopy every 10 years.  Flexible sigmoidoscopy or colonoscopy.** / Every 5 years for a flexible sigmoidoscopy or every 10 years for a colonoscopy beginning at age 7 years and continuing until age 32 years.  Hepatitis C blood test.** / For all people born from [birth year] through 1965 and any individual with known risks for hepatitis C.  Osteoporosis screening.** / A one-time screening for women ages 30 years and over and women at risk for fractures or osteoporosis.  Skin self-exam. / Monthly.  Influenza vaccine. / Every year.  Tetanus, diphtheria, and acellular pertussis (Tdap/Td)  vaccine.** / 1 dose of Td every 10 years.  Varicella vaccine.** / Consult your health care provider.  Zoster vaccine.** / 1 dose for adults aged 35 years or older.  Pneumococcal 13-valent conjugate (PCV13) vaccine.** / Consult your health care provider.  Pneumococcal polysaccharide (PPSV23) vaccine.** / 1 dose for all adults aged 46 years and older.  Meningococcal vaccine.** / Consult your health care provider.  Hepatitis A vaccine.** / Consult your health care provider.  Hepatitis B vaccine.** / Consult your health care provider.  Haemophilus influenzae type b (Hib) vaccine.** / Consult your health care provider. ** Family history and personal history of risk and conditions may change your health care provider's recommendations.   This information is not intended to replace advice given to you by your health care provider. Make sure you discuss any questions you have with your health care provider.   Document Released: 11/09/2001 Document Revised: 10/04/2014 Document Reviewed: 02/08/2011 Elsevier Interactive Patient Education [employer].

## 2015-10-16 NOTE — Progress Notes (Signed)
Subjective:  Patient ID: [name], female    DOB: [birth year]  Age: 70 y.o. MRN: [id number]  CC: COPD; Annual Exam; and Hyperlipidemia   HPI [name] presents for a complete physical, follow-up after recent admission for a gallstone that caused hepatitis and pancreatitis and recheck of COPD. She tells me that [facility] didn't help her lungs much and she needs to switch back to Symbicort.  Outpatient Prescriptions Prior to Visit  Medication Sig Dispense Refill  . ALPRAZolam (XANAX) 1 MG tablet TAKE 1 TABLET THREE TIMES A DAY AS NEEDED FOR SLEEP OR ANXIETY 60 tablet 5  . aspirin 325 MG EC tablet Take 1 tablet (325 mg total) by mouth every morning.    . butalbital-acetaminophen-caffeine (FIORICET) 50-325-40 MG per tablet Take 1-2 pills as needed for headache 60 tablet 5  . calcium-vitamin D (OSCAL WITH D) 250-125 MG-UNIT per tablet Take 1 tablet by mouth every morning.     . COMBIVENT RESPIMAT 20-100 MCG/ACT AERS respimat INHALE 1 PUFF INTO THE LUNGS EVERY 6 (SIX) HOURS. 4 g 5  . furosemide (LASIX) 40 MG tablet Take 1 tablet (40 mg total) by mouth 2 (two) times daily. 180 tablet 0  . KLOR-CON M20 20 MEQ tablet TAKE 1 TABLET EVERY DAY 180 tablet 3  . montelukast (SINGULAIR) 10 MG tablet Take 1 tablet (10 mg total) by mouth at bedtime. 30 tablet 11  . Multiple Vitamins-Minerals (MULTIVITAMIN WITH MINERALS) tablet Take 1 tablet by mouth every morning.     . nortriptyline (PAMELOR) 25 MG capsule Take 2 capsules (50 mg total) by mouth at bedtime. 60 capsule 11  . Omega 3 1200 MG CAPS Take 1,200 mg by mouth every morning.     . OXYGEN-HELIUM IN Inhale 3 L into the lungs See admin instructions. Uses when needed during the day, uses continuous throughout the night    . pantoprazole (PROTONIX) 40 MG tablet TAKE 1 TABLET BY MOUTH ONCE A DAY 90 tablet 3  . albuterol (VENTOLIN HFA) 108 (90 Base) MCG/ACT inhaler Inhale 2 puffs into the lungs every 4 (four) hours as needed. 108 g 1  . Fluticasone  Furoate-Vilanterol (BREO ELLIPTA) 200-25 MCG/INH AEPB Inhale 1 puff into the lungs daily. 30 each 11  . lactulose, encephalopathy, (CHRONULAC) 10 GM/15ML SOLN Take 30 mLs (20 g total) by mouth 3 (three) times daily as needed (Constipation). 1892 mL 1  . ondansetron (ZOFRAN) 8 MG tablet Take 1 tablet (8 mg total) by mouth 2 (two) times daily as needed for nausea or vomiting. 30 tablet 11  . oxyCODONE (OXY IR/ROXICODONE) 5 MG immediate release tablet Take 1 tablet (5 mg total) by mouth every 6 (six) hours as needed for moderate pain. 30 tablet 0   No facility-administered medications prior to visit.    ROS Review of Systems  Constitutional: Negative.  Negative for fever, chills, diaphoresis, appetite change and fatigue.  HENT: Negative.  Negative for sore throat and trouble swallowing.   Eyes: Negative.   Respiratory: Positive for cough (NP), shortness of breath and wheezing. Negative for apnea, choking, chest tightness and stridor.   Cardiovascular: Negative.  Negative for chest pain, palpitations and leg swelling.  Gastrointestinal: Negative.  Negative for nausea, vomiting, abdominal pain, diarrhea, constipation and blood in stool.  Endocrine: Negative.   Genitourinary: Negative.   Musculoskeletal: Negative.   Skin: Negative.  Negative for color change.  Allergic/Immunologic: Negative.   Neurological: Negative.  Negative for dizziness, tremors, syncope, weakness, light-headedness and numbness.  Hematological: Negative.  Negative for adenopathy. Does not bruise/bleed easily.  Psychiatric/Behavioral: Negative.     Objective:  BP 120/80 mmHg  Pulse 82  Temp(Src) 98.1 F (36.7 C) (Oral)  Resp 20  Ht 5\' 2"  (1.575 m)  Wt 123 lb (55.792 kg)  BMI 22.49 kg/m2  SpO2 96%  BP Readings from Last 3 Encounters:  10/16/15 120/80  09/07/15 139/80  08/19/15 130/68    Wt Readings from Last 3 Encounters:  10/16/15 123 lb (55.792 kg)  09/07/15 121 lb 14.6 oz (55.3 kg)  08/19/15 123 lb  (55.792 kg)    Physical Exam  Constitutional: She is oriented to person, place, and time. She appears well-developed and well-nourished. No distress.  HENT:  Head: Normocephalic and atraumatic.  Mouth/Throat: Oropharynx is clear and moist. No oropharyngeal exudate.  Eyes: Conjunctivae are normal. Right eye exhibits no discharge. Left eye exhibits no discharge. No scleral icterus.  Neck: Normal range of motion. Neck supple. No JVD present. No tracheal deviation present. No thyromegaly present.  Pulmonary/Chest: Effort normal. No accessory muscle usage. No respiratory distress. She has no decreased breath sounds. She has wheezes in the right upper field, the right middle field, the right lower field, the left upper field, the left middle field and the left lower field. She has no rhonchi. She has no rales. She exhibits no tenderness.  She has mild, diffuse inspiratory and expiratory wheezes. There is good airflow.  Abdominal: Soft. Bowel sounds are normal. She exhibits no distension and no mass. There is no tenderness. There is no rebound and no guarding.  Musculoskeletal: Normal range of motion. She exhibits no edema or tenderness.  Lymphadenopathy:    She has no cervical adenopathy.  Neurological: She is oriented to person, place, and time.  Skin: Skin is warm and dry. No rash noted. She is not diaphoretic. No erythema. No pallor.  Psychiatric: She has a normal mood and affect. Her behavior is normal. Judgment and thought content normal.  Vitals reviewed.   Lab Results  Component Value Date   WBC 8.1 10/16/2015   HGB 13.4 10/16/2015   HCT 40.9 10/16/2015   PLT 322.0 10/16/2015   GLUCOSE 94 10/16/2015   CHOL 205* 10/11/2014   TRIG 96 10/11/2014   HDL 77 10/11/2014   LDLCALC 109* 10/11/2014   ALT 19 10/16/2015   AST 26 10/16/2015   NA 139 10/16/2015   K 4.8 10/16/2015   CL 95* 10/16/2015   CREATININE 1.04 10/16/2015   BUN 17 10/16/2015   CO2 38* 10/16/2015   TSH 1.18 10/16/2015    INR 0.87 09/05/2013   HGBA1C 5.3 10/16/2015    Dg Chest 2 View  09/03/2015  CLINICAL DATA:  70 year old female with abdominal pain and chest pain EXAM: CHEST  2 VIEW COMPARISON:  Chest radiograph dated 07/18/2014 and abdominal CT dated 01/01/2015 FINDINGS: Two views of the chest demonstrate emphysematous changes of the lungs with mild diffuse interstitial prominence. There is blunting of the costophrenic angles which may represent small pleural effusions. No focal consolidation or pneumothorax. Stable cardiac silhouette. The osseous structures appear unremarkable. Right axillary surgical clips noted. IMPRESSION: Mild diffuse interstitial prominence.  No focal consolidation. Electronically Signed   By: Anner Crete M.D.   On: 09/03/2015 23:44   Ct Abdomen Pelvis W Contrast  09/04/2015  CLINICAL DATA:  Acute onset of upper abdominal pain, radiating to the back. Elevated lipase. Initial encounter. EXAM: CT ABDOMEN AND PELVIS WITH CONTRAST TECHNIQUE: Multidetector CT imaging of the abdomen  and pelvis was performed using the standard protocol following bolus administration of intravenous contrast. CONTRAST:  32mL OMNIPAQUE IOHEXOL 300 MG/ML  SOLN COMPARISON:  CT of the abdomen and pelvis from 01/30/2012, and right upper quadrant ultrasound performed earlier today at 1:18 a.m. FINDINGS: The visualized lung bases are clear. Scattered coronary artery calcifications are seen. There is diffuse prominence of the intrahepatic biliary ducts, with prominence of the common bile duct to 1.4 cm. Surrounding soft tissue inflammation is noted. The patient is status post cholecystectomy, with clips noted along the gallbladder fossa. A 7 mm stone is noted at the distal common bile duct, stable from 2013. Though this may still be within normal limits, the mild soft tissue edema about the porta hepatis raises question for cholangitis. The pancreas and adrenal glands are unremarkable. The kidneys are unremarkable in  appearance. There is no evidence of hydronephrosis. No renal or ureteral stones are seen. No perinephric stranding is appreciated. No free fluid is identified. The proximal small bowel is unremarkable in appearance. The stomach is within normal limits. No acute vascular abnormalities are seen. Scattered calcification is noted along the abdominal aorta and its branches. There is minimal ectasia of the distal abdominal aorta. A moderate anterior abdominal wall hernia is noted, containing a relatively long segment of small bowel. There is no evidence for obstruction. Postoperative change is noted along the ascending colon. The remaining colon is grossly unremarkable in appearance. The bladder is moderately distended and grossly remarkable. The uterus is unremarkable in appearance. The ovaries are grossly symmetric. No suspicious adnexal masses are seen. No inguinal lymphadenopathy is seen. No acute osseous abnormalities are identified. Facet disease noted along the lumbar spine, with mild grade 1 anterolisthesis of L4 on L5. There is mild chronic loss of height at L1 and L3. IMPRESSION: 1. Diffuse prominence of the intrahepatic biliary ducts, with prominence of the common bile duct to 1.4 cm. This is new from 2013, with new surrounding soft tissue inflammation at the porta hepatis. This raises question for cholangitis. Would correlate for associated symptoms. 2. 7 mm stone at the distal common bile duct is stable from 2013. 3. Scattered coronary artery calcifications seen. 4. Scattered calcification along the abdominal aorta and its branches. 5. Moderate anterior abdominal wall hernia, containing a relatively long segment of small bowel. No evidence for obstruction. Electronically Signed   By: Garald Balding M.D.   On: 09/04/2015 02:40   Mr 3d Recon At Scanner  09/04/2015  CLINICAL DATA:  70 year old female with history of severe abdominal pain yesterday evening. CT scan demonstrated findings concerning for  potential cholangitis, and stone in the distal common bile duct. EXAM: MRI ABDOMEN WITHOUT AND WITH CONTRAST (INCLUDING MRCP) TECHNIQUE: Multiplanar multisequence MR imaging of the abdomen was performed both before and after the administration of intravenous contrast. Heavily T2-weighted images of the biliary and pancreatic ducts were obtained, and three-dimensional MRCP images were rendered by post processing. CONTRAST:  62mL MULTIHANCE GADOBENATE DIMEGLUMINE 529 MG/ML IV SOLN COMPARISON:  CT of the abdomen and pelvis 09/04/2015. FINDINGS: Lower chest: Lipomatous hypertrophy of the interatrial septum. Otherwise, unremarkable. Hepatobiliary: Mild diffuse increased T2 signal intensity in a periportal distribution throughout the liver. MRCP images demonstrate minimal prominence of the intrahepatic bile ducts. Common bile duct is mildly dilated measuring 8 mm within the porta hepatis. In addition, there is a 8 mm filling defect in the distal common bile duct shortly before the ampulla, compatible with a retained ductal stone. No suspicious cystic or solid  hepatic lesions are identified. No significant periportal enhancement identified on post gadolinium images. Status post cholecystectomy. Pancreas: No pancreatic mass. No pancreatic ductal dilatation. No pancreatic or peripancreatic fluid or inflammatory changes. Spleen: Spleen is normal in appearance. Adrenals/Urinary Tract: Focal area of cortical thinning in the posterior aspect of the upper pole of left kidney, most compatible with chronic post infectious or inflammatory scarring. Sub cm lesions in the kidneys bilaterally are low T1 signal intensity, high T2 signal intensity, and do not enhance, compatible with tiny simple cysts, measuring up to 9 mm in the lower pole of the right kidney. No aggressive renal lesions identified. No hydroureteronephrosis in the visualized abdomen. Stomach/Bowel: Visualized portions are unremarkable. Vascular/Lymphatic: Atherosclerosis  in the visualized abdominal vasculature, without evidence of aneurysm. No lymphadenopathy noted in the abdomen. Other: No significant volume of ascites in the visualized peritoneal cavity. Musculoskeletal: No aggressive osseous lesions are identified in the visualized portions of the peritoneal cavity. IMPRESSION: 1. 8 mm filling defect in the distal common bile duct shortly before the ampulla, compatible with a retained ductal stone. This appears to be a chronic finding and has been present on prior studies dating back to 01/30/2012. 2. While there is minimal intrahepatic biliary ductal prominence and slight dilatation of the common bile duct, these findings are commonly seen in the setting of benign post cholecystectomy physiology. 3. There is a small amount of mild periportal edema on today's examination. No periportal enhancement to suggest cholangitis at this time. 4. Atherosclerosis. 5. Additional incidental findings, as above. Electronically Signed   By: Vinnie Langton M.D.   On: 09/04/2015 15:22   Mr Jeananne Rama W/wo Cm/mrcp  09/04/2015  CLINICAL DATA:  70 year old female with history of severe abdominal pain yesterday evening. CT scan demonstrated findings concerning for potential cholangitis, and stone in the distal common bile duct. EXAM: MRI ABDOMEN WITHOUT AND WITH CONTRAST (INCLUDING MRCP) TECHNIQUE: Multiplanar multisequence MR imaging of the abdomen was performed both before and after the administration of intravenous contrast. Heavily T2-weighted images of the biliary and pancreatic ducts were obtained, and three-dimensional MRCP images were rendered by post processing. CONTRAST:  51mL MULTIHANCE GADOBENATE DIMEGLUMINE 529 MG/ML IV SOLN COMPARISON:  CT of the abdomen and pelvis 09/04/2015. FINDINGS: Lower chest: Lipomatous hypertrophy of the interatrial septum. Otherwise, unremarkable. Hepatobiliary: Mild diffuse increased T2 signal intensity in a periportal distribution throughout the liver. MRCP  images demonstrate minimal prominence of the intrahepatic bile ducts. Common bile duct is mildly dilated measuring 8 mm within the porta hepatis. In addition, there is a 8 mm filling defect in the distal common bile duct shortly before the ampulla, compatible with a retained ductal stone. No suspicious cystic or solid hepatic lesions are identified. No significant periportal enhancement identified on post gadolinium images. Status post cholecystectomy. Pancreas: No pancreatic mass. No pancreatic ductal dilatation. No pancreatic or peripancreatic fluid or inflammatory changes. Spleen: Spleen is normal in appearance. Adrenals/Urinary Tract: Focal area of cortical thinning in the posterior aspect of the upper pole of left kidney, most compatible with chronic post infectious or inflammatory scarring. Sub cm lesions in the kidneys bilaterally are low T1 signal intensity, high T2 signal intensity, and do not enhance, compatible with tiny simple cysts, measuring up to 9 mm in the lower pole of the right kidney. No aggressive renal lesions identified. No hydroureteronephrosis in the visualized abdomen. Stomach/Bowel: Visualized portions are unremarkable. Vascular/Lymphatic: Atherosclerosis in the visualized abdominal vasculature, without evidence of aneurysm. No lymphadenopathy noted in the abdomen. Other: No significant  volume of ascites in the visualized peritoneal cavity. Musculoskeletal: No aggressive osseous lesions are identified in the visualized portions of the peritoneal cavity. IMPRESSION: 1. 8 mm filling defect in the distal common bile duct shortly before the ampulla, compatible with a retained ductal stone. This appears to be a chronic finding and has been present on prior studies dating back to 01/30/2012. 2. While there is minimal intrahepatic biliary ductal prominence and slight dilatation of the common bile duct, these findings are commonly seen in the setting of benign post cholecystectomy physiology. 3.  There is a small amount of mild periportal edema on today's examination. No periportal enhancement to suggest cholangitis at this time. 4. Atherosclerosis. 5. Additional incidental findings, as above. Electronically Signed   By: Vinnie Langton M.D.   On: 09/04/2015 15:22   US Abdomen Limited Ruq  09/04/2015  CLINICAL DATA:  Acute onset of generalized abdominal pain. Elevated LFTs and elevated lipase. Initial encounter. EXAM: US ABDOMEN LIMITED - RIGHT UPPER QUADRANT COMPARISON:  CT of the abdomen and pelvis performed 01/30/2012, and abdominal ultrasound performed 08/01/2011 FINDINGS: Gallbladder: Status post cholecystectomy.  No retained stones seen. Common bile duct: Diameter: 1.1 cm, within normal limits status post cholecystectomy. Liver: No focal lesion identified. Within normal limits in parenchymal echogenicity. IMPRESSION: Unremarkable ultrasound of the right upper quadrant. Status post cholecystectomy. Electronically Signed   By: Garald Balding M.D.   On: 09/04/2015 01:43    Assessment & Plan:   Catarina was seen today for copd, annual exam and hyperlipidemia.  Diagnoses and all orders for this visit:  Elevated LFTs -     Hepatic function panel; Future -     CBC with Differential/Platelet; Future  Coronary artery disease involving native coronary artery of native heart without angina pectoris -     Cardio IQ (R) Advanced Lipid Panel; Future  Osteoporosis  Other emphysema ([place]) -     SYMBICORT 160-4.5 MCG/ACT inhaler; Inhale 2 puffs into the lungs 2 (two) times daily.  Chronic bronchitis, unspecified chronic bronchitis type ([place]) -     SYMBICORT 160-4.5 MCG/ACT inhaler; Inhale 2 puffs into the lungs 2 (two) times daily.  Hyperglycemia -     Basic metabolic panel; Future -     Hemoglobin A1c; Future  Hyperlipidemia with target LDL less than 70 -     Hepatic function panel; Future -     CBC with Differential/Platelet; Future -     TSH; Future  Tobacco abuse  Other orders -      albuterol (VENTOLIN HFA) 108 (90 Base) MCG/ACT inhaler; Inhale 2 puffs into the lungs every 4 (four) hours as needed.   I have discontinued Ms. Jacuinde's ondansetron, lactulose (encephalopathy), oxyCODONE, and Fluticasone Furoate-Vilanterol. I am also having her maintain her multivitamin with minerals, calcium-vitamin D, OXYGEN-HELIUM IN, Omega 3, KLOR-CON M20, butalbital-acetaminophen-caffeine, furosemide, nortriptyline, ALPRAZolam, COMBIVENT RESPIMAT, montelukast, aspirin, pantoprazole, diclofenac sodium, LIVALO, lactulose, albuterol, and SYMBICORT.  Meds ordered this encounter  Medications  . DISCONTD: SYMBICORT 160-4.5 MCG/ACT inhaler    Sig: INHALE 2 PUFFS INTO THE LUNGS 2 (TWO) TIMES DAILY.    Refill:  3  . diclofenac sodium (VOLTAREN) 1 % GEL    Sig: APPLY 4 G TOPICALLY DAILY AS NEEDED (APPLY TO HANDS). HAND PAIN    Refill:  2  . LIVALO 2 MG TABS    Sig: Take 1 tablet by mouth daily.    Refill:  3  . lactulose (CHRONULAC) 10 GM/15ML solution    Sig: TAKE 30MLS  BY MOUTH 3 TIMES DAILY AS NEEDED FOR CONSTIPATION    Refill:  1  . albuterol (VENTOLIN HFA) 108 (90 Base) MCG/ACT inhaler    Sig: Inhale 2 puffs into the lungs every 4 (four) hours as needed.    Dispense:  108 g    Refill:  1  . SYMBICORT 160-4.5 MCG/ACT inhaler    Sig: Inhale 2 puffs into the lungs 2 (two) times daily.    Dispense:  1 Inhaler    Refill:  11     Follow-up: Return if symptoms worsen or fail to improve.  Scarlette Calico, MD

## 2015-10-17 ENCOUNTER — Telehealth: Payer: Self-pay

## 2015-10-17 NOTE — Assessment & Plan Note (Signed)

## 2015-10-17 NOTE — Telephone Encounter (Signed)
covermymeds [id number]

## 2015-10-19 LAB — CARDIO IQ(R) ADVANCED LIPID PANEL
Apolipoprotein B: 81 mg/dL (ref 49–103)
Cholesterol, Total: 197 mg/dL (ref 125–200)
Cholesterol/HDL Ratio: 2.5 calc (ref <=5.0)
HDL Cholesterol: 78 mg/dL (ref >=46)
LDL Large: 8152 nmol/L (ref 5038–17886)
LDL Medium: 258 nmol/L (ref 121–397)
LDL Particle Number: 1218 nmol/L (ref 1016–2185)
LDL Peak Size: 224.8 Angstrom (ref >=218.2)
LDL Small: 168 nmol/L (ref 115–386)
LDL, Calculated: 99 mg/dL
Lipoprotein (a): 13 nmol/L (ref <75)
Non-HDL Cholesterol: 119 mg/dL
Triglycerides: 100 mg/dL

## 2015-10-20 NOTE — Telephone Encounter (Signed)
Recevied Clinical rationale form,. Filled and faxed

## 2015-10-21 ENCOUNTER — Telehealth: Payer: Self-pay | Admitting: Internal Medicine

## 2015-10-22 ENCOUNTER — Other Ambulatory Visit: Payer: Self-pay | Admitting: Internal Medicine

## 2015-10-22 ENCOUNTER — Ambulatory Visit: Payer: Self-pay

## 2015-10-22 LAB — HM DEXA SCAN: HM Dexa Scan: -2.6

## 2015-10-24 ENCOUNTER — Other Ambulatory Visit: Payer: Self-pay | Admitting: Internal Medicine

## 2015-10-24 DIAGNOSIS — M810 Age-related osteoporosis without current pathological fracture: Secondary | ICD-10-CM

## 2015-10-26 ENCOUNTER — Encounter: Payer: Self-pay | Admitting: Internal Medicine

## 2015-10-30 ENCOUNTER — Other Ambulatory Visit: Payer: Self-pay | Admitting: Internal Medicine

## 2015-10-30 NOTE — Telephone Encounter (Signed)
Please advise, thanks.

## 2015-11-03 ENCOUNTER — Telehealth: Payer: Self-pay | Admitting: Internal Medicine

## 2015-11-03 NOTE — Telephone Encounter (Signed)
I have electronically submitted pt's info for [name] insurance verification and will notify you once I have a response. Thank you. °

## 2015-11-04 ENCOUNTER — Telehealth: Payer: Self-pay | Admitting: Internal Medicine

## 2015-11-04 NOTE — Telephone Encounter (Signed)
Pt request result of the bone density. Please call her back

## 2015-11-04 NOTE — Telephone Encounter (Signed)
Pt informed of results.

## 2015-11-10 ENCOUNTER — Ambulatory Visit: Payer: Medicare Other

## 2015-11-12 NOTE — Telephone Encounter (Signed)
appt made

## 2015-11-12 NOTE — Telephone Encounter (Signed)
Can we get this pt on nurse schedule for her proilia injection

## 2015-11-12 NOTE — Telephone Encounter (Signed)
I have rec'd [name]'s insurance verification for [name].  She will have an estimated responsibility of $0.  Please make pt aware this is an estimate and we will not know and exact amt until insurance(s) has/have paid.  I have sent a copy of the summary of benefits to be scanned into pt's chart.  Once pt recs injection, please let me know actual injection date so I can update the [name] portal.  If you have any questions, please let me know.  Thank you.

## 2015-11-14 ENCOUNTER — Other Ambulatory Visit: Payer: Self-pay | Admitting: *Deleted

## 2015-11-14 ENCOUNTER — Encounter: Payer: Self-pay | Admitting: Internal Medicine

## 2015-11-14 ENCOUNTER — Ambulatory Visit (INDEPENDENT_AMBULATORY_CARE_PROVIDER_SITE_OTHER): Payer: Medicare Other | Admitting: Internal Medicine

## 2015-11-14 DIAGNOSIS — M810 Age-related osteoporosis without current pathological fracture: Secondary | ICD-10-CM | POA: Diagnosis not present

## 2015-11-14 DIAGNOSIS — I2510 Atherosclerotic heart disease of native coronary artery without angina pectoris: Secondary | ICD-10-CM

## 2015-11-14 LAB — VITAMIN D 25 HYDROXY (VIT D DEFICIENCY, FRACTURES): VITD: 30.5 ng/mL (ref 30.00–100.00)

## 2015-11-14 LAB — BASIC METABOLIC PANEL
BUN: 10 mg/dL (ref 6–23)
CO2: 36 mEq/L — ABNORMAL HIGH (ref 19–32)
Calcium: 10.5 mg/dL (ref 8.4–10.5)
Chloride: 95 mEq/L — ABNORMAL LOW (ref 96–112)
Creatinine, Ser: 1.01 mg/dL (ref 0.40–1.20)
GFR: 57.61 mL/min — ABNORMAL LOW (ref >60.00)
Glucose, Bld: 82 mg/dL (ref 70–99)
Potassium: 4.6 mEq/L (ref 3.5–5.1)
Sodium: 136 mEq/L (ref 135–145)

## 2015-11-14 NOTE — Progress Notes (Signed)
Patient ID: [name], female   DOB: September 02, 1946, 70 y.o.   MRN: [id number]   HPI  [name] is a 70 y.o.-year-old female, referred by her PCP, Dr. Ronnald Ramp, for management of osteoporosis.  Pt was dx with OP in 2017, but osteopenia 2009.  I reviewed pt's DEXA scans: Date L1-L4 (L3)T score FN T score FRAX  10/16/2015 ([place]) - Lunar +0.9 RFN: -2.6 LFN: -2.5   10/31/2012 ([facility]) - Hologic n/a RFN: n/a LFN: -2.1 MOF: 11% 10 year hip fx risk: 1.8%   She denies fractures but many falls throughout her life.  No dizziness/vertigo/orthostasis/poor vision.  She has been on the following OP treatments:  - iv zolendronic acid q6 months (by Dr Jana Hakim) from 2012-2016 - [name] 12/2014 - did not have the second inj >> will have this at the end of this mo  No h/o vitamin D deficiency, but no recent levels available.  Pt is on calcium (600 mg) and vitamin D (?). She also eats dairy and green, leafy, vegetables.   No weight bearing exercises, but walks for exercise.  She does not take high vitamin A doses.  No steroids.  Menopause was at 70 y/o.   Pt does not have a FH of osteoporosis.  No h/o hyper/hypocalcemia or hyperparathyroidism. Had a high calcium at last lab draw, though. No h/o kidney stones. Lab Results  Component Value Date   CALCIUM 11.1* 10/16/2015   CALCIUM 9.8 09/07/2015   CALCIUM 9.4 09/06/2015   CALCIUM 9.3 09/04/2015   CALCIUM 9.9 09/03/2015   CALCIUM 10.3 04/14/2015   CALCIUM 10.0 10/11/2014   CALCIUM 9.2 07/10/2014   CALCIUM 9.9 07/08/2014   CALCIUM 9.8 07/07/2014   No h/o thyrotoxicosis. Reviewed TSH recent levels:  Lab Results  Component Value Date   TSH 1.18 10/16/2015   TSH 1.22 12/19/2013   TSH 0.521 11/11/2011   No h/o CKD. Last BUN/Cr: Lab Results  Component Value Date   BUN 17 10/16/2015   CREATININE 1.04 10/16/2015   She has a history of breast cancer.  She has a recent h/o gallstone  pancreatitis.  ROS: Constitutional: no weight gain/loss, + decreased appetite, no fatigue, no subjective hyperthermia/hypothermia Eyes: no blurry vision, no xerophthalmia ENT: no sore throat, no nodules palpated in throat, no dysphagia/odynophagia, no hoarseness Cardiovascular: no CP/+ SOB/no palpitations/leg swelling Respiratory: no cough/+ SOB/+ wheezing Gastrointestinal: + N/no V/D/+ C Musculoskeletal: no muscle/joint aches Skin: no rashes Neurological: no tremors/numbness/tingling/dizziness, + headache Psychiatric: no depression/+ anxiety  Past Medical History  Diagnosis Date  . Angina   . Heart murmur   . Depressed   . Coronary artery disease 2006    2 stents w/previous MI  . Hypertension   . Migraine   . Anxiety   . Myocardial infarction [facility]) 2006    NSTEMI  . COPD (chronic obstructive pulmonary disease) (HCC)     oxygen-dependent 4LPM [place]  . Moderate to severe pulmonary hypertension ([place])   . Diastolic CHF ([place])   . Cancer of breast ([place]) dx'd 2008    RT Breast  . Aortic stenosis     mild  . Dyslipidemia   . NSVT (nonsustained ventricular tachycardia) (HCC)     h/o  . History of nuclear stress test 08/03/2011    attenuation at apex - no perfusion defects   . Abnormal LFTs 08/02/2011  . Acute liver failure 06/19/2013  . Acute renal failure ([place]) 06/19/2013  . Acute respiratory failure ([place]) 06/19/2013  . Altered mental  status 08/01/2011  . Anxiety state, unspecified 12/03/2013  . Breast cancer of upper-outer quadrant of left female breast ([place]) 08/21/2013  . CAD (coronary artery disease) of artery bypass graft 11/06/2013  . CAD (coronary artery disease), MI R/O 08/01/2011    PCI in 2006 (bare metal stent, unknown artery) - [place]   . CHF,  acute diastolic, BNP 4k on admissio 08/01/2011  . Chronic respiratory failure ([place]) 02/02/2012  . Compression fracture of L1 lumbar vertebra ([place]) 02/02/2012  . Mild aortic stenosis 08/01/2011    AVA 1.69 cm2 (06/02/2011)   . Hyponatremia  01/31/2012  . Hyperkalemia, on ACE prior to admission 11/11/2011  . HTN (hypertension) 11/06/2013  . History of breast cancer in female 06/23/2013  . 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 Breast lumpectomy Right 2008  . Cardiac catheterization  2006  . Cholecystectomy  2011  . Partial colectomy  2009    for obstruction: temporary ostomy, later reversed.   . Transthoracic echocardiogram  11/12/2011    EF 0000000, normal systolic function, grade 1 diastolic dysfunction; ventricular septal flattening (D-sign); mild AS; trace-mild MR; LA mildly dilated; RV mod dilated; RA mod dilated; severe pulm HTN; elevated CVP  . Right heart catheterization N/A 09/05/2013    Procedure: RIGHT HEART CATH;  Surgeon: Jolaine Artist, MD;  Location: [facility] CATH LAB;  Service: Cardiovascular;  Laterality: N/A;  . Ercp N/A 09/05/2015    Procedure: ENDOSCOPIC RETROGRADE CHOLANGIOPANCREATOGRAPHY (ERCP);  Surgeon: Ladene Artist, MD;  Location: [facility];  Service: Endoscopy;  Laterality: N/A;   Social History   Social History  . Marital Status: Widowed    Spouse Name: N/A  . Number of Children: 3  . Years of Education: 12   Occupational History  .  [employer]   Social History Main Topics  . Smoking status: Current Some Day Smoker -- 0.25 packs/day for 47 years    Types: Cigarettes  . Smokeless tobacco: Never Used     Comment: smokes when anxious, uses eCigs as well.   . Alcohol Use: No     Comment: wine with dinner occasionally  . Drug Use: No  . Sexual Activity: Not Currently     Comment: 3 cig a day   Current Outpatient Prescriptions on File Prior to Visit  Medication Sig Dispense Refill  . albuterol (VENTOLIN HFA) 108 (90 Base) MCG/ACT inhaler Inhale 2 puffs into the lungs every 4 (four) hours as needed. 108 g 1  . ALPRAZolam (XANAX) 1 MG tablet TAKE 1 TABLET THREE TIMES A DAY AS NEEDED FOR SLEEP OR ANXIETY 60 tablet 5  . aspirin 325 MG EC tablet  Take 1 tablet (325 mg total) by mouth every morning.    . butalbital-acetaminophen-caffeine (FIORICET) 50-325-40 MG per tablet Take 1-2 pills as needed for headache 60 tablet 5  . calcium-vitamin D (OSCAL WITH D) 250-125 MG-UNIT per tablet Take 1 tablet by mouth every morning.     . COMBIVENT RESPIMAT 20-100 MCG/ACT AERS respimat INHALE 1 PUFF INTO THE LUNGS EVERY 6 (SIX) HOURS. 4 g 5  . diclofenac sodium (VOLTAREN) 1 % GEL APPLY 4 G TOPICALLY DAILY AS NEEDED (APPLY TO HANDS). HAND PAIN  2  . furosemide (LASIX) 40 MG tablet TAKE 1 TABLET (40 MG TOTAL) BY MOUTH 2 (TWO) TIMES DAILY. 180 tablet 1  . KLOR-CON M20 20 MEQ tablet TAKE 1 TABLET EVERY DAY 180 tablet 3  . lactulose (CHRONULAC) 10 GM/15ML solution  TAKE 30MLS BY MOUTH 3 TIMES DAILY AS NEEDED FOR CONSTIPATION  1  . LIVALO 2 MG TABS Take 1 tablet by mouth daily.  3  . montelukast (SINGULAIR) 10 MG tablet Take 1 tablet (10 mg total) by mouth at bedtime. 30 tablet 11  . Multiple Vitamins-Minerals (MULTIVITAMIN WITH MINERALS) tablet Take 1 tablet by mouth every morning.     . nortriptyline (PAMELOR) 25 MG capsule Take 2 capsules (50 mg total) by mouth at bedtime. 60 capsule 11  . Omega 3 1200 MG CAPS Take 1,200 mg by mouth every morning.     . ondansetron (ZOFRAN) 8 MG tablet TAKE 1 TABLET BY MOUTH TWO TIMES A DAY AS NEEDED FOR NAUSEA OR VOMITING 30 tablet 3  . OXYGEN-HELIUM IN Inhale 3 L into the lungs See admin instructions. Uses when needed during the day, uses continuous throughout the night    . pantoprazole (PROTONIX) 40 MG tablet TAKE 1 TABLET BY MOUTH ONCE A DAY 90 tablet 3  . SYMBICORT 160-4.5 MCG/ACT inhaler Inhale 2 puffs into the lungs 2 (two) times daily. 1 Inhaler 11   No current facility-administered medications on file prior to visit.   Allergies  Allergen Reactions  . Ceftriaxone Anaphylaxis and Other (See Comments)    *ROCEPHIN*  "Blow up like a balloon"  . Hydroxyzine Shortness Of Breath and Other (See Comments)    Pt  states med make her light headed, get sob sxs  . Lexapro [Escitalopram] Other (See Comments)    Pt states med make her dizzy   Family History  Problem Relation Age of Onset  . Schizophrenia Sister   . Alzheimer's disease Mother   . Hyperlipidemia Brother   . Heart disease Sister   . Diabetes Sister   . Cancer Neg Hx   . Stroke Neg Hx   . COPD Neg Hx   . Depression Neg Hx   . Drug abuse Neg Hx   . Early death Neg Hx   . Hypertension Neg Hx   . Kidney disease Neg Hx    PE: BP 100/62 mmHg  Pulse 79  Temp(Src) 97.4 F (36.3 C) (Oral)  Resp 14  Ht 5\' 2"  (1.575 m)  Wt 124 lb 9.6 oz (56.518 kg)  BMI 22.78 kg/m2  SpO2 93% Wt Readings from Last 3 Encounters:  11/14/15 124 lb 9.6 oz (56.518 kg)  10/16/15 123 lb (55.792 kg)  09/07/15 121 lb 14.6 oz (55.3 kg)   Constitutional: Normal weight, in NAD. No kyphosis. She is on oxygen. Eyes: PERRLA, EOMI, no exophthalmos ENT: moist mucous membranes, no thyromegaly, no cervical lymphadenopathy Cardiovascular: RRR, No MRG Respiratory: CTA B Gastrointestinal: abdomen soft, NT, ND, BS+ Musculoskeletal: no deformities, strength intact in all 4 Skin: moist, warm, no rashes Neurological: Slight tremor with outstretched hands, DTR normal in all 4  Assessment: 1. Osteoporosis  Plan: 1. Osteoporosis - likely postmenopausal - Discussed about increased risk of fracture, depending on the T score, greatly increased when the T score is lower than -2.5, but it is actually a continuum and -2.5 should not be regarded as an absolute threshold. We reviewed her last 2 DEXA scan reports and images together, and I explained that based on the T scores, she has an increased risk for fractures. However, the 2 DEXA scans were run on different machines therefore they are not directly comparable. Regardless, her most recent DEXA scan showed osteoporotic range T-scores at the level of the femoral necks. - we reviewed her dietary and  supplemental calcium and  vitamin D intake. I recommended to make sure she gets 1000 mg of calcium daily and I will check vit D today to see if she needs further supplementation - given her specific instructions about food sources for Calcium and Vitamin D - see pt instructions  - discussed fall precautions   - given handout from [place] Re: weight bearing exercises - advised to do this every day or at least 5/7 days - we discussed about maintaining a good amount of protein in her diet. - We discussed about the different medication classes, benefits and side effects (including atypical fractures and ONJ - no dental surgery planned, but I advised her to let her dentist no about probably a).  - I explained that, since she already completed a course of bisphosphonates, sq denosumab ([name]) is the best choice, and I would continue this for 3-6 years, then use bisphosphonates possibly for a year to avoid BMD loss after stopping probably a. I would use Teriparatide as a last resort. We would move the [name]  Injections to our office per her preference. She already had one injection and will have the second one on 11/20/2015. - will check a new DEXA scan in 2 years  - For now, will check a vitamin D and a BMP to recheck on her calcium - will see pt back in a year  - time spent with the patient: 1 hour, of which >50% was spent in obtaining information about her new dx of osteoporosis, reviewing her previous labs, evaluations, and treatments, counseling her about her condition (please see the discussed topics above), and developing a plan to further investigate it; she had a number of questions which I addressed.  Component     Latest Ref Rng 11/14/2015          Sodium     135 - 145 mEq/L 136  Potassium     3.5 - 5.1 mEq/L 4.6  Chloride     96 - 112 mEq/L 95 (L)  CO2     19 - 32 mEq/L 36 (H)  Glucose     70 - 99 mg/dL 82  BUN     6 - 23 mg/dL 10  Creatinine     0.40 - 1.20 mg/dL 1.01  Calcium      8.4 - 10.5 mg/dL 10.5  GFR     >60.00 mL/min 57.61 (L)  VITD     30.00 - 100.00 ng/mL 30.50   Vit D normal, but at LLN >> add a 1000 units vit D dose daily. Ca normal, at ULN.

## 2015-11-14 NOTE — Patient Instructions (Signed)
Please continue with the [name] injection.  How Can I Prevent Falls? Men and women with osteoporosis need to take care not to fall down. Falls can break bones. Some reasons people fall are: Poor vision  Poor balance  Certain diseases that affect how you walk  Some types of medicine, such as sleeping pills.  Some tips to help prevent falls outdoors are: Use a cane or walker  Wear rubber-soled shoes so you don't slip  Walk on grass when sidewalks are slippery  In winter, put salt or kitty litter on icy sidewalks.  Some ways to help prevent falls indoors are: Keep rooms free of clutter, especially on floors  Use plastic or carpet runners on slippery floors  Wear low-heeled shoes that provide good support  Do not walk in socks, stockings, or slippers  Be sure carpets and area rugs have skid-proof backs or are tacked to the floor  Be sure stairs are well lit and have rails on both sides  Put grab bars on bathroom walls near tub, shower, and toilet  Use a rubber bath mat in the shower or tub  Keep a flashlight next to your bed  Use a sturdy step stool with a handrail and wide steps  Add more lights in rooms (and night lights) Buy a cordless phone to keep with you so that you don't have to rush to the phone       when it rings and so that you can call for help if you fall.   (adapted from http://www.niams.HostessTraining.at)  Dietary sources of calcium and vitamin D:  Calcium content (mg) - http://www.niams.https://www.gonzalez.org/  Fortified oatmeal, 1 packet 350  Sardines, canned in oil, with edible bones, 3 oz. 324  Cheddar cheese, 1 oz. shredded 306  Milk, nonfat, 1 cup 302  Milkshake, 1 cup 300  Yogurt, plain, low-fat, 1 cup 300  Soybeans, cooked, 1 cup 261  Tofu, firm, with calcium,  cup 204  Orange juice, fortified with calcium, 6 oz. 200-260 (varies)  Salmon, canned, with edible bones, 3 oz. 181  Pudding, instant, made  with 2% milk,  cup 153  Baked beans, 1 cup 142  Cottage cheese, 1% milk fat, 1 cup 138  Spaghetti, lasagna, 1 cup 125  Frozen yogurt, vanilla, soft-serve,  cup 103  Ready-to-eat cereal, fortified with calcium, 1 cup 100-1,000 (varies)  Cheese pizza, 1 slice 100  Fortified waffles, 2 100  Turnip greens, boiled,  cup 99  Broccoli, raw, 1 cup 90  Ice cream, vanilla,  cup 85  Soy or rice milk, fortified with calcium, 1 cup 80-500 (varies)   Vitamin D content (International Units, IU) - https://www.ars.usda.gov Cod liver oil, 1 tablespoon 1,360  Swordfish, cooked, 3 oz 566  Salmon (sockeye), cooked, 3 oz 447  Tuna fish, canned in water, drained, 3 oz 154  Orange juice fortified with vitamin D, 1 cup (check product labels, as amount of added vitamin D varies) 137  Milk, nonfat, reduced fat, and whole, vitamin D-fortified, 1 cup 115-124  Yogurt, fortified with 20% of the daily value for vitamin D, 6 oz 80  Margarine, fortified, 1 tablespoon 60  Sardines, canned in oil, drained, 2 sardines 46  Liver, beef, cooked, 3 oz 42  Egg, 1 large (vitamin D is found in yolk) 41  Ready-to-eat cereal, fortified with 10% of the daily value for vitamin D, 0.75-1 cup  40  Cheese, Swiss, 1 oz 6   Exercise for Strong Bones (from [employer]) There are  two types of exercises that are important for building and maintaining bone density:  weight-bearing and muscle-strengthening exercises. Weight-bearing Exercises These exercises include activities that make you move against gravity while staying upright. Weight-bearing exercises can be high-impact or low-impact. High-impact weight-bearing exercises help build bones and keep them strong. If you have broken a bone due to osteoporosis or are at risk of breaking a bone, you may need to avoid high-impact exercises. If you're not sure, you should check with your healthcare provider. Examples of high-impact weight-bearing exercises  are: . Dancing . Doing high-impact aerobics . Hiking . Jogging/running . Jumping Rope . Stair climbing . Tennis Low-impact weight-bearing exercises can also help keep bones strong and are a safe alternative if you cannot do high-impact exercises. Examples of low-impact weight-bearing exercises are: . Using elliptical training machines . Doing low-impact aerobics . Using stair-step machines . Fast walking on a treadmill or outside Muscle-Strengthening Exercises These exercises include activities where you move your body, a weight or some other resistance against gravity. They are also known as resistance exercises and include: . Lifting weights . Using elastic exercise bands . Using weight machines . Lifting your own body weight . Functional movements, such as standing and rising up on your toes Yoga and Pilates can also improve strength, balance and flexibility. However, certain positions may not be safe for people with osteoporosis or those at increased risk of broken bones. For example, exercises that have you bend forward may increase the chance of breaking a bone in the spine. A physical therapist should be able to help you learn which exercises are safe and appropriate for you. Non-Impact Exercises Non-impact exercises can help you to improve balance, posture and how well you move in everyday activities. These exercises can also help to increase muscle strength and decrease the risk of falls and broken bones. Some of these exercises include: . Balance exercises that strengthen your legs and test your balance, such as Tai Chi, can decrease your risk of falls. . Posture exercises that improve your posture and reduce rounded or "sloping" shoulders can help you decrease the chance of breaking a bone, especially in the spine. . Functional exercises that improve how well you move can help you with everyday activities and decrease your chance of falling and breaking a bone. For example, if you  have trouble getting up from a chair or climbing stairs, you should do these activities as exercises. A physical therapist can teach you balance, posture and functional exercises. Starting a New Exercise Program If you haven't exercised regularly for a while, check with your healthcare provider before beginning a new exercise program-particularly if you have health problems such as heart disease, diabetes or high blood pressure. If you're at high risk of breaking a bone, you should work with a physical therapist to develop a safe exercise program. Once you have your healthcare provider's approval, start slowly. If you've already broken bones in the spine because of osteoporosis, be very careful to avoid activities that require reaching down, bending forward, rapid twisting motions, heavy lifting and those that increase your chance of a fall. As you get started, your muscles may feel sore for a day or two after you exercise. If soreness lasts longer, you may be working too hard and need to ease up. Exercises should be done in a pain-free range of motion. How Much Exercise Do You Need? Weight-bearing exercises 30 minutes on most days of the week. Do a 30-minutesession or multiple sessions spread  out throughout the day. The benefits to your bones are the same.   Muscle-strengthening exercises Two to three days per week. If you don't have much time for strengthening/resistance training, do small amounts at a time. You can do just one body part each day. For example do arms one day, legs the next and trunk the next. You can also spread these exercises out during your normal day.  Balance, posture and functional exercises Every day or as often as needed. You may want to focus on one area more than the others. If you have fallen or lose your balance, spend time doing balance exercises. If you are getting rounded shoulders, work more on posture exercises. If you have trouble climbing stairs or getting up from the  couch, do more functional exercises. You can also perform these exercises at one time or spread them during your day. Work with a phyiscal therapist to learn the right exercises for you.   Please return in 1 year.

## 2015-11-20 ENCOUNTER — Ambulatory Visit (INDEPENDENT_AMBULATORY_CARE_PROVIDER_SITE_OTHER): Payer: Medicare Other

## 2015-11-20 ENCOUNTER — Telehealth: Payer: Self-pay | Admitting: Internal Medicine

## 2015-11-20 DIAGNOSIS — M810 Age-related osteoporosis without current pathological fracture: Secondary | ICD-10-CM | POA: Diagnosis not present

## 2015-11-20 MED ORDER — DENOSUMAB 60 MG/ML ~~LOC~~ SOLN
60.0000 mg | Freq: Once | SUBCUTANEOUS | Status: AC
  Administered 2015-11-20: 60 mg via SUBCUTANEOUS

## 2015-11-20 NOTE — Telephone Encounter (Signed)
Please read message below and advise.  

## 2015-11-20 NOTE — Telephone Encounter (Signed)
Patient called to notify Dr. Cruzita Lederer that she has received her [name] injection today    Thank you

## 2015-11-20 NOTE — Telephone Encounter (Signed)
Great! - let's arrange for the next one here in 6 mo.

## 2015-11-27 ENCOUNTER — Other Ambulatory Visit: Payer: Self-pay | Admitting: Internal Medicine

## 2015-11-27 ENCOUNTER — Other Ambulatory Visit: Payer: Self-pay | Admitting: Pulmonary Disease

## 2015-12-05 ENCOUNTER — Other Ambulatory Visit: Payer: Self-pay | Admitting: Internal Medicine

## 2015-12-05 MED ORDER — POTASSIUM CHLORIDE CRYS ER 20 MEQ PO TBCR: 20.0000 meq | EXTENDED_RELEASE_TABLET | Freq: Every day | ORAL | Status: DC

## 2015-12-05 NOTE — Addendum Note (Signed)
Addended by: Earnstine Regal on: 12/05/2015 02:52 PM   Modules accepted: Orders

## 2015-12-05 NOTE — Telephone Encounter (Signed)
Received call from emily wanting to verify how many Klor con pt is suppose to be taking. Per chart 1 tablet a day. Updated med list.../lmb

## 2015-12-19 ENCOUNTER — Ambulatory Visit
Admission: RE | Admit: 2015-12-19 | Discharge: 2015-12-19 | Disposition: A | Discharge status: Home / Self Care | Payer: Medicare Other | Source: Ambulatory Visit

## 2015-12-19 DIAGNOSIS — Z853 Personal history of malignant neoplasm of breast: Secondary | ICD-10-CM

## 2015-12-19 DIAGNOSIS — Z9889 Other specified postprocedural states: Secondary | ICD-10-CM

## 2015-12-19 DIAGNOSIS — Z1231 Encounter for screening mammogram for malignant neoplasm of breast: Secondary | ICD-10-CM

## 2015-12-22 LAB — HM MAMMOGRAPHY

## 2015-12-24 ENCOUNTER — Telehealth: Payer: Self-pay | Admitting: Internal Medicine

## 2015-12-24 NOTE — Telephone Encounter (Signed)
spooke with pt did not see where we sent script. She state she did not pick up.

## 2015-12-24 NOTE — Telephone Encounter (Signed)
States patient had a script for [employer] sent to her pharmacy, [facility] on [address].  Patient did not know she had to take this.  Patient is requesting a call back in regards.

## 2016-01-02 ENCOUNTER — Telehealth: Payer: Self-pay | Admitting: Internal Medicine

## 2016-01-02 DIAGNOSIS — I350 Nonrheumatic aortic (valve) stenosis: Secondary | ICD-10-CM

## 2016-01-02 NOTE — Telephone Encounter (Signed)
From Dr. Lysbeth Penner notes 08/12/15 "Ms. [name] is doing well with her pulmonary disease which is stable. She's not been hospitalized over the past 6 months. She will be due for repeat check of her aortic stenosis which is been mild by echo likely at her next office visit." Recommended 6 mo f/u. Appropriate for repeat echo which will be ~1 yr since last study.

## 2016-01-02 NOTE — Telephone Encounter (Signed)
Called pt and advised OK to return for visit - she is aware we do routine echocardiogram offsite at [facility] office and that a scheduler will contact w/ information. I will alert them to schedule appt a few days following the echo for results discussion at office visit.

## 2016-01-02 NOTE — Telephone Encounter (Signed)
Pt called to make recall appt, state  she was told she would have an echo next visit, appt notes do not indicate that and there isn't an order-does she need one?

## 2016-01-19 ENCOUNTER — Telehealth: Payer: Self-pay | Admitting: *Deleted

## 2016-01-19 DIAGNOSIS — G43709 Chronic migraine without aura, not intractable, without status migrainosus: Secondary | ICD-10-CM

## 2016-01-19 MED ORDER — BUTALBITAL-APAP-CAFFEINE 50-325-40 MG PO TABS: ORAL_TABLET | ORAL | Status: DC

## 2016-01-19 NOTE — Telephone Encounter (Signed)
rx written

## 2016-01-19 NOTE — Telephone Encounter (Signed)
[facility].../lmb 

## 2016-01-19 NOTE — Telephone Encounter (Signed)
Pt requesting refill on Butabital-Acetaminophen. She know she is needing a PA she states her insurance is with [employer] Member ID # [id number].Marland KitchenJohny Chess

## 2016-01-19 NOTE — Telephone Encounter (Signed)
PA initiated via [place] clinical review dept Ref# [id number]

## 2016-01-20 NOTE — Telephone Encounter (Signed)
PA was DENIED, appeal initiated via phone line [phone] ref [phone]

## 2016-01-26 NOTE — Telephone Encounter (Signed)
PA APPROVED, medication picked up by pt 01/24/2016 per pharmacy

## 2016-02-04 ENCOUNTER — Other Ambulatory Visit: Payer: Self-pay

## 2016-02-04 ENCOUNTER — Ambulatory Visit (HOSPITAL_COMMUNITY): Payer: Medicare Other | Attending: Internal Medicine

## 2016-02-04 DIAGNOSIS — I509 Heart failure, unspecified: Secondary | ICD-10-CM | POA: Insufficient documentation

## 2016-02-04 DIAGNOSIS — I350 Nonrheumatic aortic (valve) stenosis: Secondary | ICD-10-CM | POA: Diagnosis present

## 2016-02-04 DIAGNOSIS — Z720 Tobacco use: Secondary | ICD-10-CM | POA: Insufficient documentation

## 2016-02-04 DIAGNOSIS — E785 Hyperlipidemia, unspecified: Secondary | ICD-10-CM | POA: Diagnosis not present

## 2016-02-04 DIAGNOSIS — I340 Nonrheumatic mitral (valve) insufficiency: Secondary | ICD-10-CM | POA: Insufficient documentation

## 2016-02-04 DIAGNOSIS — I110 Hypertensive heart disease with heart failure: Secondary | ICD-10-CM | POA: Diagnosis not present

## 2016-02-05 ENCOUNTER — Other Ambulatory Visit (HOSPITAL_COMMUNITY): Payer: Medicare Other

## 2016-02-10 ENCOUNTER — Ambulatory Visit: Payer: Medicare Other | Admitting: Internal Medicine

## 2016-02-12 ENCOUNTER — Emergency Department (HOSPITAL_COMMUNITY): Payer: Medicare Other

## 2016-02-12 ENCOUNTER — Encounter (HOSPITAL_COMMUNITY): Payer: Self-pay

## 2016-02-12 ENCOUNTER — Inpatient Hospital Stay (HOSPITAL_COMMUNITY)
Admission: EM | Admit: 2016-02-12 | Discharge: 2016-02-16 | DRG: 190 | Disposition: A | Discharge status: Home / Self Care | Payer: Medicare Other | Attending: Internal Medicine | Admitting: Internal Medicine

## 2016-02-12 DIAGNOSIS — F17210 Nicotine dependence, cigarettes, uncomplicated: Secondary | ICD-10-CM | POA: Diagnosis present

## 2016-02-12 DIAGNOSIS — I272 Other secondary pulmonary hypertension: Secondary | ICD-10-CM | POA: Diagnosis present

## 2016-02-12 DIAGNOSIS — F411 Generalized anxiety disorder: Secondary | ICD-10-CM | POA: Diagnosis present

## 2016-02-12 DIAGNOSIS — J440 Chronic obstructive pulmonary disease with acute lower respiratory infection: Principal | ICD-10-CM | POA: Diagnosis present

## 2016-02-12 DIAGNOSIS — E785 Hyperlipidemia, unspecified: Secondary | ICD-10-CM | POA: Diagnosis present

## 2016-02-12 DIAGNOSIS — J42 Unspecified chronic bronchitis: Secondary | ICD-10-CM

## 2016-02-12 DIAGNOSIS — J441 Chronic obstructive pulmonary disease with (acute) exacerbation: Secondary | ICD-10-CM | POA: Diagnosis present

## 2016-02-12 DIAGNOSIS — I252 Old myocardial infarction: Secondary | ICD-10-CM

## 2016-02-12 DIAGNOSIS — Z853 Personal history of malignant neoplasm of breast: Secondary | ICD-10-CM | POA: Diagnosis not present

## 2016-02-12 DIAGNOSIS — Z809 Family history of malignant neoplasm, unspecified: Secondary | ICD-10-CM | POA: Diagnosis not present

## 2016-02-12 DIAGNOSIS — I5032 Chronic diastolic (congestive) heart failure: Secondary | ICD-10-CM | POA: Diagnosis present

## 2016-02-12 DIAGNOSIS — J181 Lobar pneumonia, unspecified organism: Secondary | ICD-10-CM | POA: Diagnosis present

## 2016-02-12 DIAGNOSIS — K219 Gastro-esophageal reflux disease without esophagitis: Secondary | ICD-10-CM | POA: Diagnosis present

## 2016-02-12 DIAGNOSIS — I2510 Atherosclerotic heart disease of native coronary artery without angina pectoris: Secondary | ICD-10-CM | POA: Diagnosis present

## 2016-02-12 DIAGNOSIS — I110 Hypertensive heart disease with heart failure: Secondary | ICD-10-CM | POA: Diagnosis present

## 2016-02-12 DIAGNOSIS — Z823 Family history of stroke: Secondary | ICD-10-CM

## 2016-02-12 DIAGNOSIS — J9610 Chronic respiratory failure, unspecified whether with hypoxia or hypercapnia: Secondary | ICD-10-CM | POA: Diagnosis present

## 2016-02-12 DIAGNOSIS — Z833 Family history of diabetes mellitus: Secondary | ICD-10-CM | POA: Diagnosis not present

## 2016-02-12 DIAGNOSIS — Z79899 Other long term (current) drug therapy: Secondary | ICD-10-CM

## 2016-02-12 DIAGNOSIS — J189 Pneumonia, unspecified organism: Secondary | ICD-10-CM

## 2016-02-12 DIAGNOSIS — Z7982 Long term (current) use of aspirin: Secondary | ICD-10-CM

## 2016-02-12 DIAGNOSIS — Z8249 Family history of ischemic heart disease and other diseases of the circulatory system: Secondary | ICD-10-CM | POA: Diagnosis not present

## 2016-02-12 DIAGNOSIS — Z720 Tobacco use: Secondary | ICD-10-CM | POA: Diagnosis not present

## 2016-02-12 DIAGNOSIS — F329 Major depressive disorder, single episode, unspecified: Secondary | ICD-10-CM | POA: Diagnosis present

## 2016-02-12 DIAGNOSIS — J9611 Chronic respiratory failure with hypoxia: Secondary | ICD-10-CM

## 2016-02-12 DIAGNOSIS — Z818 Family history of other mental and behavioral disorders: Secondary | ICD-10-CM | POA: Diagnosis not present

## 2016-02-12 DIAGNOSIS — K5900 Constipation, unspecified: Secondary | ICD-10-CM | POA: Diagnosis present

## 2016-02-12 DIAGNOSIS — G43909 Migraine, unspecified, not intractable, without status migrainosus: Secondary | ICD-10-CM | POA: Diagnosis present

## 2016-02-12 LAB — CBC
HCT: 35.2 % — ABNORMAL LOW (ref 36.0–46.0)
Hemoglobin: 11.9 g/dL — ABNORMAL LOW (ref 12.0–15.0)
MCH: 31.5 pg (ref 26.0–34.0)
MCHC: 33.8 g/dL (ref 30.0–36.0)
MCV: 93.1 fL (ref 78.0–100.0)
Platelets: 175 10*3/uL (ref 150–400)
RBC: 3.78 MIL/uL — ABNORMAL LOW (ref 3.87–5.11)
RDW: 13.7 % (ref 11.5–15.5)
WBC: 8.7 10*3/uL (ref 4.0–10.5)

## 2016-02-12 LAB — COMPREHENSIVE METABOLIC PANEL
ALT: 29 U/L (ref 14–54)
AST: 31 U/L (ref 15–41)
Albumin: 3.2 g/dL — ABNORMAL LOW (ref 3.5–5.0)
Alkaline Phosphatase: 61 U/L (ref 38–126)
Anion gap: 8 (ref 5–15)
BUN: 17 mg/dL (ref 6–20)
CO2: 27 mmol/L (ref 22–32)
Calcium: 8.5 mg/dL — ABNORMAL LOW (ref 8.9–10.3)
Chloride: 100 mmol/L — ABNORMAL LOW (ref 101–111)
Creatinine, Ser: 1.26 mg/dL — ABNORMAL HIGH (ref 0.44–1.00)
GFR calc Af Amer: 49 mL/min — ABNORMAL LOW (ref >60)
GFR calc non Af Amer: 42 mL/min — ABNORMAL LOW (ref >60)
Glucose, Bld: 113 mg/dL — ABNORMAL HIGH (ref 65–99)
Potassium: 4 mmol/L (ref 3.5–5.1)
Sodium: 135 mmol/L (ref 135–145)
Total Bilirubin: 0.2 mg/dL — ABNORMAL LOW (ref 0.3–1.2)
Total Protein: 7.1 g/dL (ref 6.5–8.1)

## 2016-02-12 LAB — PHOSPHORUS: Phosphorus: 1.9 mg/dL — ABNORMAL LOW (ref 2.5–4.6)

## 2016-02-12 LAB — TROPONIN I: Troponin I: <0.03 ng/mL (ref <0.031)

## 2016-02-12 LAB — MAGNESIUM: Magnesium: 1.8 mg/dL (ref 1.7–2.4)

## 2016-02-12 LAB — BRAIN NATRIURETIC PEPTIDE: B Natriuretic Peptide: 63.7 pg/mL (ref 0.0–100.0)

## 2016-02-12 LAB — TSH: TSH: 0.309 u[IU]/mL — ABNORMAL LOW (ref 0.350–4.500)

## 2016-02-12 MED ORDER — ACETAMINOPHEN 325 MG PO TABS: 650.0000 mg | ORAL_TABLET | Freq: Four times a day (QID) | ORAL | Status: DC | PRN

## 2016-02-12 MED ORDER — ADULT MULTIVITAMIN W/MINERALS CH
1.0000 | ORAL_TABLET | Freq: Every morning | ORAL | Status: DC
  Administered 2016-02-13 – 2016-02-16 (×4): 1 via ORAL
  Filled 2016-02-12 (×4): qty 1

## 2016-02-12 MED ORDER — ONDANSETRON HCL 4 MG PO TABS: 4.0000 mg | ORAL_TABLET | Freq: Four times a day (QID) | ORAL | Status: DC | PRN

## 2016-02-12 MED ORDER — LEVOFLOXACIN IN D5W 750 MG/150ML IV SOLN
750.0000 mg | Freq: Once | INTRAVENOUS | Status: AC
  Administered 2016-02-12: 750 mg via INTRAVENOUS
  Filled 2016-02-12: qty 150

## 2016-02-12 MED ORDER — LACTULOSE 10 GM/15ML PO SOLN
20.0000 g | Freq: Three times a day (TID) | ORAL | Status: DC
  Administered 2016-02-12 – 2016-02-16 (×7): 20 g via ORAL
  Filled 2016-02-12 (×13): qty 30

## 2016-02-12 MED ORDER — BUDESONIDE 0.25 MG/2ML IN SUSP
0.2500 mg | Freq: Two times a day (BID) | RESPIRATORY_TRACT | Status: DC
  Administered 2016-02-12 – 2016-02-16 (×7): 0.25 mg via RESPIRATORY_TRACT
  Filled 2016-02-12 (×8): qty 2

## 2016-02-12 MED ORDER — FUROSEMIDE 40 MG PO TABS
40.0000 mg | ORAL_TABLET | Freq: Every day | ORAL | Status: DC
  Administered 2016-02-13 – 2016-02-16 (×4): 40 mg via ORAL
  Filled 2016-02-12 (×4): qty 1

## 2016-02-12 MED ORDER — VITAMIN D3 25 MCG (1000 UNIT) PO TABS
1000.0000 [IU] | ORAL_TABLET | Freq: Every day | ORAL | Status: DC
Start: 2016-02-13 — End: 2016-02-16
  Administered 2016-02-13 – 2016-02-16 (×4): 1000 [IU] via ORAL
  Filled 2016-02-12 (×4): qty 1

## 2016-02-12 MED ORDER — ASPIRIN EC 325 MG PO TBEC
325.0000 mg | DELAYED_RELEASE_TABLET | Freq: Every morning | ORAL | Status: DC
Start: 2016-02-13 — End: 2016-02-16
  Administered 2016-02-13 – 2016-02-16 (×4): 325 mg via ORAL
  Filled 2016-02-12 (×4): qty 1

## 2016-02-12 MED ORDER — NORTRIPTYLINE HCL 25 MG PO CAPS
50.0000 mg | ORAL_CAPSULE | Freq: Every day | ORAL | Status: DC
  Administered 2016-02-12 – 2016-02-15 (×4): 50 mg via ORAL
  Filled 2016-02-12 (×5): qty 2

## 2016-02-12 MED ORDER — OMEGA-3-ACID ETHYL ESTERS 1 G PO CAPS
1000.0000 mg | ORAL_CAPSULE | Freq: Every morning | ORAL | Status: DC
  Administered 2016-02-13 – 2016-02-16 (×4): 1000 mg via ORAL
  Filled 2016-02-12 (×4): qty 1

## 2016-02-12 MED ORDER — PANTOPRAZOLE SODIUM 40 MG PO TBEC
40.0000 mg | DELAYED_RELEASE_TABLET | Freq: Every day | ORAL | Status: DC
  Administered 2016-02-13 – 2016-02-16 (×4): 40 mg via ORAL
  Filled 2016-02-12 (×4): qty 1

## 2016-02-12 MED ORDER — METHYLPREDNISOLONE SODIUM SUCC 125 MG IJ SOLR
60.0000 mg | Freq: Three times a day (TID) | INTRAMUSCULAR | Status: DC
  Administered 2016-02-12 – 2016-02-14 (×5): 60 mg via INTRAVENOUS
  Filled 2016-02-12 (×8): qty 0.96

## 2016-02-12 MED ORDER — METHYLPREDNISOLONE SODIUM SUCC 125 MG IJ SOLR
125.0000 mg | Freq: Once | INTRAMUSCULAR | Status: AC
  Administered 2016-02-12: 125 mg via INTRAVENOUS
  Filled 2016-02-12: qty 2

## 2016-02-12 MED ORDER — GUAIFENESIN ER 600 MG PO TB12
600.0000 mg | ORAL_TABLET | Freq: Two times a day (BID) | ORAL | Status: DC
  Administered 2016-02-12: 600 mg via ORAL
  Filled 2016-02-12 (×3): qty 1

## 2016-02-12 MED ORDER — CALCIUM CARBONATE-VITAMIN D 500-200 MG-UNIT PO TABS
0.5000 | ORAL_TABLET | Freq: Every morning | ORAL | Status: DC
  Filled 2016-02-12 (×2): qty 0.5

## 2016-02-12 MED ORDER — BUTALBITAL-APAP-CAFFEINE 50-325-40 MG PO TABS
1.0000 | ORAL_TABLET | Freq: Two times a day (BID) | ORAL | Status: DC | PRN
  Administered 2016-02-15: 1 via ORAL
  Filled 2016-02-12: qty 1

## 2016-02-12 MED ORDER — MONTELUKAST SODIUM 10 MG PO TABS
10.0000 mg | ORAL_TABLET | Freq: Every day | ORAL | Status: DC
  Administered 2016-02-12 – 2016-02-15 (×4): 10 mg via ORAL
  Filled 2016-02-12 (×5): qty 1

## 2016-02-12 MED ORDER — IPRATROPIUM-ALBUTEROL 0.5-2.5 (3) MG/3ML IN SOLN: 3.0000 mL | Freq: Four times a day (QID) | RESPIRATORY_TRACT | Status: DC

## 2016-02-12 MED ORDER — IPRATROPIUM-ALBUTEROL 0.5-2.5 (3) MG/3ML IN SOLN
3.0000 mL | Freq: Three times a day (TID) | RESPIRATORY_TRACT | Status: DC
  Administered 2016-02-13 – 2016-02-16 (×10): 3 mL via RESPIRATORY_TRACT
  Filled 2016-02-12 (×9): qty 3

## 2016-02-12 MED ORDER — POTASSIUM CHLORIDE CRYS ER 20 MEQ PO TBCR
20.0000 meq | EXTENDED_RELEASE_TABLET | Freq: Every day | ORAL | Status: DC
  Administered 2016-02-13 – 2016-02-16 (×4): 20 meq via ORAL
  Filled 2016-02-12 (×4): qty 1

## 2016-02-12 MED ORDER — ONDANSETRON HCL 4 MG/2ML IJ SOLN: 4.0000 mg | Freq: Four times a day (QID) | INTRAMUSCULAR | Status: DC | PRN

## 2016-02-12 MED ORDER — SODIUM CHLORIDE 0.9 % IV SOLN
INTRAVENOUS | Status: DC
  Administered 2016-02-12: 19:00:00 via INTRAVENOUS

## 2016-02-12 MED ORDER — IPRATROPIUM-ALBUTEROL 0.5-2.5 (3) MG/3ML IN SOLN
3.0000 mL | Freq: Four times a day (QID) | RESPIRATORY_TRACT | Status: DC
  Administered 2016-02-12: 3 mL via RESPIRATORY_TRACT
  Filled 2016-02-12: qty 3

## 2016-02-12 MED ORDER — IPRATROPIUM-ALBUTEROL 0.5-2.5 (3) MG/3ML IN SOLN
3.0000 mL | Freq: Once | RESPIRATORY_TRACT | Status: AC
  Administered 2016-02-12: 3 mL via RESPIRATORY_TRACT
  Filled 2016-02-12: qty 3

## 2016-02-12 MED ORDER — ALPRAZOLAM 1 MG PO TABS
1.0000 mg | ORAL_TABLET | Freq: Two times a day (BID) | ORAL | Status: DC | PRN
  Administered 2016-02-12 – 2016-02-14 (×2): 1 mg via ORAL
  Filled 2016-02-12 (×2): qty 1

## 2016-02-12 MED ORDER — LEVOFLOXACIN IN D5W 750 MG/150ML IV SOLN
750.0000 mg | INTRAVENOUS | Status: DC
  Administered 2016-02-13: 750 mg via INTRAVENOUS
  Filled 2016-02-12 (×2): qty 150

## 2016-02-12 MED ORDER — IPRATROPIUM-ALBUTEROL 0.5-2.5 (3) MG/3ML IN SOLN: 3.0000 mL | RESPIRATORY_TRACT | Status: DC | PRN

## 2016-02-12 MED ORDER — SODIUM CHLORIDE 0.9 % IV SOLN
INTRAVENOUS | Status: DC
  Administered 2016-02-12: 10:00:00 via INTRAVENOUS

## 2016-02-12 MED ORDER — HEPARIN SODIUM (PORCINE) 5000 UNIT/ML IJ SOLN
5000.0000 [IU] | Freq: Three times a day (TID) | INTRAMUSCULAR | Status: DC
  Administered 2016-02-12 – 2016-02-16 (×11): 5000 [IU] via SUBCUTANEOUS
  Filled 2016-02-12 (×14): qty 1

## 2016-02-12 MED ORDER — ACETAMINOPHEN 650 MG RE SUPP: 650.0000 mg | Freq: Four times a day (QID) | RECTAL | Status: DC | PRN

## 2016-02-12 NOTE — Progress Notes (Signed)
Utilization Review completed.  Duwayne Matters RN CM  

## 2016-02-12 NOTE — H&P (Signed)
History and Physical    [name] [id number] DOB: 1946/01/22 DOA: 02/12/2016  Referring Provider: Dr. Vanita Panda.   PCP: Scarlette Calico, MD   Outpatient Specialists:  Velora Heckler Pulmonologist   Patient coming from: Home  Chief Complaint: worsening SOB/wheezing and productive cough   HPI: [name] is a 70 y.o. female with PMH significant for  HTN, diastolic heart failure, COPD, chronic resp failure, tobacco abuse, depression/anxiety and dyslipidemia: who presented to ED secondary to productive cough, chills and worsening SOB/wheezing. Patient symptoms has been present for the last 3-4 days and worsening. Patient denies CP, orthopnea, LE swelling, nausea, vomiting, dysuria, HA's, focal motor deficit, melena, hematochezia and any other complaints. Patient endorses that nebulizer treatments at home, along with her inhaler therapy has not improved her symptoms.  ED Course: CXR with bi-lobar PNA findings and physical exam suggesting acute COPD exacerbation. Started on antibiotics, solumedrol given and received also nebulizer treatment. TR[facility] called to admit patient for further evaluation and treatment of CAP and COPD exacerbation.  Review of Systems:  All other systems reviewed and apart from HPI, are negative.  Past Medical History  Diagnosis Date  . Angina   . Heart murmur   . Depressed   . Coronary artery disease 2006    2 stents w/previous MI  . Hypertension   . Migraine   . Anxiety   . Myocardial infarction [facility]) 2006    NSTEMI  . COPD (chronic obstructive pulmonary disease) (H[facility])     oxygen-dependent 4LPM [place]  . Moderate to severe pulmonary hypertension ([place])   . Diastolic CHF ([place])   . Cancer of breast ([place]) dx'd 2008    RT Breast  . Aortic stenosis     mild  . Dyslipidemia   . NSVT (nonsustained ventricular tachycardia) (HCC)     h/o  . History of nuclear stress test 08/03/2011    attenuation at apex - no perfusion defects   . Abnormal LFTs 08/02/2011  . Acute  liver failure 06/19/2013  . Acute renal failure ([place]) 06/19/2013  . Acute respiratory failure ([place]) 06/19/2013  . Altered mental status 08/01/2011  . Anxiety state, unspecified 12/03/2013  . Breast cancer of upper-outer quadrant of left female breast ([place]) 08/21/2013  . CAD (coronary artery disease) of artery bypass graft 11/06/2013  . CAD (coronary artery disease), MI R/O 08/01/2011    PCI in 2006 (bare metal stent, unknown artery) - [place]   . CHF,  acute diastolic, BNP 4k on admissio 08/01/2011  . Chronic respiratory failure ([place]) 02/02/2012  . Compression fracture of L1 lumbar vertebra ([place]) 02/02/2012  . Mild aortic stenosis 08/01/2011    AVA 1.69 cm2 (06/02/2011)   . Hyponatremia 01/31/2012  . Hyperkalemia, on ACE prior to admission 11/11/2011  . HTN (hypertension) 11/06/2013  . History of breast cancer in female 06/23/2013  . 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 Breast lumpectomy Right 2008  . Cardiac catheterization  2006  . Cholecystectomy  2011  . Partial colectomy  2009    for obstruction: temporary ostomy, later reversed.   . Transthoracic echocardiogram  11/12/2011    EF 0000000, normal systolic function, grade 1 diastolic dysfunction; ventricular septal flattening (D-sign); mild AS; trace-mild MR; LA mildly dilated; RV mod dilated; RA mod dilated; severe pulm HTN; elevated CVP  . Right heart catheterization N/A 09/05/2013    Procedure: RIGHT HEART CATH;  Surgeon: Jolaine Artist, MD;  Location: [facility] CATH  LAB;  Service: Cardiovascular;  Laterality: N/A;  . Ercp N/A 09/05/2015    Procedure: ENDOSCOPIC RETROGRADE CHOLANGIOPANCREATOGRAPHY (ERCP);  Surgeon: Ladene Artist, MD;  Location: [facility];  Service: Endoscopy;  Laterality: N/A;     reports that she has been smoking Cigarettes.  She has a 11.75 pack-year smoking history. She has never used smokeless tobacco. She reports that she does not drink alcohol or use illicit  drugs.  Allergies  Allergen Reactions  . Ceftriaxone Anaphylaxis and Other (See Comments)    *ROCEPHIN*  "Blow up like a balloon"  . Hydroxyzine Shortness Of Breath and Other (See Comments)    Pt states med make her light headed, get sob sxs  . Lexapro [Escitalopram] Other (See Comments)    Pt states med make her dizzy    Family History  Problem Relation Age of Onset  . Schizophrenia Sister   . Alzheimer's disease Mother   . Hyperlipidemia Brother   . Heart disease Sister   . Diabetes Sister   . Cancer Neg Hx   . Stroke Neg Hx   . COPD Neg Hx   . Depression Neg Hx   . Drug abuse Neg Hx   . Early death Neg Hx   . Hypertension Neg Hx   . Kidney disease Neg Hx      Prior to Admission medications   Medication Sig Start Date End Date Taking? Authorizing Provider  albuterol (VENTOLIN HFA) 108 (90 Base) MCG/ACT inhaler Inhale 2 puffs into the lungs every 4 (four) hours as needed. 10/16/15  Yes [name], MD  ALPRAZolam (XANAX) 1 MG tablet TAKE 1 TABLET THREE TIMES A DAY AS NEEDED FOR SLEEP OR ANXIETY Patient taking differently: Take 1 mg by mouth 2 (two) times daily as needed for anxiety or sleep. TAKE 1 TABLET THREE TIMES A DAY AS NEEDED FOR SLEEP OR ANXIETY 08/15/15  Yes [name], MD  Artificial Tear Ointment (DRY EYES OP) Apply 2 drops to eye daily as needed.   Yes Historical Provider, MD  aspirin 325 MG EC tablet Take 1 tablet (325 mg total) by mouth every morning. 09/21/15  Yes Velvet Bathe, MD  ATROVENT HFA 17 MCG/ACT inhaler USE 2 PUFFS EVERY FOUR HOURS AS NEEDED 12/05/15  Yes [name], MD  butalbital-acetaminophen-caffeine (FIORICET) [phone] MG tablet Take 1-2 pills as needed for headache Patient taking differently: Take 1 tablet by mouth 2 (two) times daily as needed for headache or migraine. Take 1-2 pills as needed for headache 01/19/16  Yes [name], MD  calcium-vitamin D (OSCAL WITH D) 250-125 MG-UNIT per tablet Take 1 tablet by mouth every  morning.    Yes Historical Provider, MD  cholecalciferol (VITAMIN D) 1000 units tablet Take 1,000 Units by mouth daily.   Yes Historical Provider, MD  COMBIVENT RESPIMAT 20-100 MCG/ACT AERS respimat INHALE 1 PUFF INTO THE LUNGS EVERY 6 (SIX) HOURS. Patient taking differently: INHALE 1 PUFF INTO THE LUNGS EVERY 8 HOURS AS NEEDED. 08/25/15  Yes Juanito Doom, MD  diclofenac sodium (VOLTAREN) 1 % GEL APPLY 4 G TOPICALLY DAILY AS NEEDED (APPLY TO HANDS). HAND PAIN 08/04/15  Yes Historical Provider, MD  furosemide (LASIX) 40 MG tablet TAKE 1 TABLET (40 MG TOTAL) BY MOUTH 2 (TWO) TIMES DAILY. 10/23/15  Yes [name], MD  ipratropium-albuterol (DUONEB) 0.5-2.5 (3) MG/3ML SOLN PUT 1 VIAL INTO NEBILZER 2 TIMES A DAY AS NEEDED DX J44.9 11/27/15  Yes Juanito Doom, MD  lactulose [facility])  10 GM/15ML solution Take 30 mLs po tid prn constipation Patient taking differently: Take 20 g by mouth 3 (three) times daily. Take 30 mLs po tid prn constipation 11/27/15  Yes [name], MD  LIVALO 2 MG TABS Take 1 tablet by mouth every evening.  08/25/15  Yes Historical Provider, MD  montelukast (SINGULAIR) 10 MG tablet Take 1 tablet (10 mg total) by mouth at bedtime. 09/01/15  Yes [name], MD  Multiple Vitamins-Minerals (MULTIVITAMIN WITH MINERALS) tablet Take 1 tablet by mouth every morning.    Yes Historical Provider, MD  nortriptyline (PAMELOR) 25 MG capsule Take 2 capsules (50 mg total) by mouth at bedtime. 07/26/15  Yes [name], MD  Omega 3 1200 MG CAPS Take 1,200 mg by mouth every morning.    Yes Historical Provider, MD  ondansetron (ZOFRAN) 8 MG tablet TAKE 1 TABLET BY MOUTH TWO TIMES A DAY AS NEEDED FOR NAUSEA OR VOMITING Patient taking differently: TAKE 1 TABLET BY MOUTH ONCE A DAY AS NEEDED FOR NAUSEA OR VOMITING 10/30/15  Yes [name], MD  pantoprazole (PROTONIX) 40 MG tablet TAKE 1 TABLET BY MOUTH ONCE A DAY 09/30/15  Yes [name], MD  potassium chloride SA (KLOR-CON M20) 20  MEQ tablet Take 1 tablet (20 mEq total) by mouth daily. 12/05/15  Yes [name], MD  SYMBICORT 160-4.5 MCG/ACT inhaler Inhale 2 puffs into the lungs 2 (two) times daily. Patient taking differently: Inhale 1 puff into the lungs 2 (two) times daily.  10/16/15  Yes [name], MD  OXYGEN-HELIUM IN Inhale 3 L into the lungs See admin instructions. Uses when needed during the day, uses continuous throughout the night    Historical Provider, MD    Physical Exam: Filed Vitals:   02/12/16 1122 02/12/16 1159 02/12/16 1358 02/12/16 1605  BP:  113/67 104/87 92/50  Pulse:  86 89 77  Temp:      TempSrc:      Resp:  16 24 21   SpO2: 93% 94% 93% 93%    Constitutional: in no acute distress and currently afebrile. Denies CP. Reports SOB sensation and productive cough. Unable to speak in full sentences and requiring O2 supplementation (even she is supposed to wear it at home; she mainly uses it for exertion and at bedtime) Eyes: PERRLA, lids and conjunctivae normal, no icterus, no nystagmus  ENMT: Mucous membranes are moist. Posterior pharynx clear of any exudate or lesions. Normal dentition.  Neck: normal, supple, no masses, no thyromegaly Respiratory: poor air movement, positive exp wheezing and rhonchi. No using accessory muscles  Cardiovascular: S1 & S2 heard, regular rate and rhythm, no murmurs / rubs / gallops. No extremity edema. 2+ pedal pulses. No carotid bruits.  Abdomen: No distension, no tenderness, no masses palpated. No hepatosplenomegaly. Bowel sounds normal.  Musculoskeletal: no clubbing / cyanosis. No joint deformity upper and lower extremities. Good ROM, no contractures. Normal muscle tone.  Skin: no rashes, no petechiae, no lesions and no open ulcers. No induration Neurologic: CN 2-12 grossly intact. Sensation intact, DTR normal. Strength 5/5 in all 4 limbs.  Psychiatric: Normal judgment and insight. Alert and oriented x 3. Normal mood.    Labs on Admission: I have personally  reviewed following labs and imaging studies  CBC:  Recent Labs Lab 02/12/16 1016  WBC 8.7  HGB 11.9*  HCT 35.2*  MCV 93.1  PLT 0000000   Basic Metabolic Panel:  Recent Labs Lab 02/12/16 1016  NA 135  K 4.0  CL 100*  CO2 27  GLUCOSE 113*  BUN 17  CREATININE 1.26*  CALCIUM 8.5*   GFR: CrCl cannot be calculated (Unknown ideal weight.).   Liver Function Tests:  Recent Labs Lab 02/12/16 1016  AST 31  ALT 29  ALKPHOS 61  BILITOT 0.2*  PROT 7.1  ALBUMIN 3.2*   Cardiac Enzymes:  Recent Labs Lab 02/12/16 1016  TROPONINI <0.03   Urine analysis:    Component Value Date/Time   COLORURINE YELLOW 12/19/2013 [place] 12/19/2013 1127   LABSPEC <=1.005* 12/19/2013 1127   PHURINE 7.0 12/19/2013 1127   GLUCOSEU NEGATIVE 12/19/2013 1127   GLUCOSEU 100* 06/20/2013 0059   HGBUR NEGATIVE 12/19/2013 1127   [place] 12/19/2013 1127   KETONESUR NEGATIVE 12/19/2013 1127   PROTEINUR >300* 06/20/2013 0059   UROBILINOGEN 0.2 12/19/2013 1127   NITRITE NEGATIVE 12/19/2013 1127   LEUKOCYTESUR MODERATE* 12/19/2013 1127   Radiological Exams on Admission: Dg Chest 2 View  02/12/2016  CLINICAL DATA:  Cough and shortness of breath for 4 days EXAM: CHEST  2 VIEW COMPARISON:  September 03, 2015. FINDINGS: There is extensive airspace consolidation throughout the lingula consistent with pneumonia. There is also some patchy infiltrate in the anterior segment left upper lobe. The right lung is clear. Heart size and pulmonary vascular normal. No adenopathy. There are surgical clips the right axillary region. There is mild degenerative change in the thoracic spine. IMPRESSION: Extensive airspace consolidation consistent with pneumonia throughout much of the lingula as well as a portion of the anterior segment left upper lobe. Right lung clear. Cardiac silhouette within normal limits. Followup PA and lateral chest radiographs recommended in 3-4 weeks following trial of  antibiotic therapy to ensure resolution and exclude underlying malignancy. Electronically Signed   By: Lowella Grip III M.D.   On: 02/12/2016 09:54    EKG:  Anterior leads with poor R wave progression (unchanged from previous tracing); no acute ischemic changes and sinus rate/rhythm   Assessment/Plan 1-Lobar pneumonia due to unspecified organism: -CXR demonstrating acute infiltrates (affecting Lingula and LLL) -will admit patient to med-surg -start tx with levaquin -PRN nebulizer and flutter valve -will check blood cx's, strep pneumoniae and legionella antigen in urine -will continue home oxygen supplementation and follow clinical response    2-COPD (chronic obstructive pulmonary disease) ([place]): with acute exacerbation -will treat with steroids, pulmicort BID, Duoneb and flutter valve -patient will be on antibiotics as mentioned above; for treatment of PNA -advise to quit smoking and keep herself smoking free  3-chronic resp failure: -will continue home oxygen supplementation  -2-3 L chronically at home -will follow O2 sat and clinical response   4-chronic diastolic heart failure: -appears stable and compensated currently -last Echo in May 2017, preserved EF appreciated -will follow low sodium diet, daily weights, strict intake/output   5-Hyperlipidemia with target LDL less than 70 -will continue Fish oil -outpatient follow up and re-initiation on statins if appropriate  6-Tobacco abuse: patient reported she quit smoking about 1-2 months ago -encourage to keep herself smoking free  7-HTN (hypertension): -stable and well controlled -will continue lasix -follow VS  8-Gastroesophageal reflux disease without esophagitis: -continue PPI  9-depression and anxiety: -will continue home medication regimen -no SI or hallucinations    10-hx of Migraine: -will continue PRN fioricet   DVT prophylaxis: heparin  Code Status: Full Code Family Communication: no family at  bedside  Disposition Plan: return to home in 2-3 days once medically stable Consults called:  none  Admission status: inpatient, med-surg bed, LOS > 2 midnights   Barton Dubois MD [facility] Pager [phone]  If 7PM-7AM, please contact night-coverage [email or web address] Password [email or web address]  02/12/2016, 4:53 PM

## 2016-02-12 NOTE — ED Notes (Signed)
Bed: WA19 Expected date:  Expected time:  Means of arrival:  Comments: 

## 2016-02-12 NOTE — ED Provider Notes (Signed)
CSN: [id number]     Arrival date & time 02/12/16  0845 History   First MD Initiated Contact with Patient 02/12/16 [phone]     Chief Complaint  Patient presents with  . Cough     (Consider location/radiation/quality/duration/timing/severity/associated sxs/prior Treatment) HPI Patient presents with concern of dyspnea, cough. Patient has minimal pain, but that her belly hurts with coughing. Chest complains of orthostatic dizziness, but no syncope, no fall. No relief with anything, including albuterol. No recent medication changes. No fever, nausea, vomiting, diarrhea. Patient received albuterol en route, with no change in her condition.   Past Medical History  Diagnosis Date  . Angina   . Heart murmur   . Depressed   . Coronary artery disease 2006    2 stents w/previous MI  . Hypertension   . Migraine   . Anxiety   . Myocardial infarction [facility]) 2006    NSTEMI  . COPD (chronic obstructive pulmonary disease) (HCC)     oxygen-dependent 4LPM [place]  . Moderate to severe pulmonary hypertension ([place])   . Diastolic CHF ([place])   . Cancer of breast ([place]) dx'd 2008    RT Breast  . Aortic stenosis     mild  . Dyslipidemia   . NSVT (nonsustained ventricular tachycardia) (HCC)     h/o  . History of nuclear stress test 08/03/2011    attenuation at apex - no perfusion defects   . Abnormal LFTs 08/02/2011  . Acute liver failure 06/19/2013  . Acute renal failure ([place]) 06/19/2013  . Acute respiratory failure ([place]) 06/19/2013  . Altered mental status 08/01/2011  . Anxiety state, unspecified 12/03/2013  . Breast cancer of upper-outer quadrant of left female breast ([place]) 08/21/2013  . CAD (coronary artery disease) of artery bypass graft 11/06/2013  . CAD (coronary artery disease), MI R/O 08/01/2011    PCI in 2006 (bare metal stent, unknown artery) - [place]   . CHF,  acute diastolic, BNP 4k on admissio 08/01/2011  . Chronic respiratory failure ([place]) 02/02/2012  . Compression fracture of L1 lumbar vertebra  ([place]) 02/02/2012  . Mild aortic stenosis 08/01/2011    AVA 1.69 cm2 (06/02/2011)   . Hyponatremia 01/31/2012  . Hyperkalemia, on ACE prior to admission 11/11/2011  . HTN (hypertension) 11/06/2013  . History of breast cancer in female 06/23/2013  . 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 Breast lumpectomy Right 2008  . Cardiac catheterization  2006  . Cholecystectomy  2011  . Partial colectomy  2009    for obstruction: temporary ostomy, later reversed.   . Transthoracic echocardiogram  11/12/2011    EF 0000000, normal systolic function, grade 1 diastolic dysfunction; ventricular septal flattening (D-sign); mild AS; trace-mild MR; LA mildly dilated; RV mod dilated; RA mod dilated; severe pulm HTN; elevated CVP  . Right heart catheterization N/A 09/05/2013    Procedure: RIGHT HEART CATH;  Surgeon: Jolaine Artist, MD;  Location: [facility] CATH LAB;  Service: Cardiovascular;  Laterality: N/A;  . Ercp N/A 09/05/2015    Procedure: ENDOSCOPIC RETROGRADE CHOLANGIOPANCREATOGRAPHY (ERCP);  Surgeon: Ladene Artist, MD;  Location: [facility];  Service: Endoscopy;  Laterality: N/A;   Family History  Problem Relation Age of Onset  . Schizophrenia Sister   . Alzheimer's disease Mother   . Hyperlipidemia Brother   . Heart disease Sister   . Diabetes Sister   . Cancer Neg Hx   . Stroke Neg Hx   . COPD Neg Hx   .  Depression Neg Hx   . Drug abuse Neg Hx   . Early death Neg Hx   . Hypertension Neg Hx   . Kidney disease Neg Hx    Social History  Substance Use Topics  . Smoking status: Current Some Day Smoker -- 0.25 packs/day for 47 years    Types: Cigarettes  . Smokeless tobacco: Never Used     Comment: smokes when anxious, uses eCigs as well.   . Alcohol Use: No     Comment: wine with dinner occasionally   OB History    No data available     Review of Systems  Constitutional:       Per HPI, otherwise negative  HENT:       Per HPI, otherwise  negative  Respiratory:       Per HPI, otherwise negative  Cardiovascular:       Per HPI, otherwise negative  Gastrointestinal: Negative for vomiting.  Endocrine:       Negative aside from HPI  Genitourinary:       Neg aside from HPI   Musculoskeletal:       Per HPI, otherwise negative  Skin: Negative.   Neurological: Negative for syncope.      Allergies  Ceftriaxone; Hydroxyzine; and Lexapro  Home Medications   Prior to Admission medications   Medication Sig Start Date End Date Taking? Authorizing Provider  albuterol (VENTOLIN HFA) 108 (90 Base) MCG/ACT inhaler Inhale 2 puffs into the lungs every 4 (four) hours as needed. 10/16/15  Yes [name], MD  ALPRAZolam (XANAX) 1 MG tablet TAKE 1 TABLET THREE TIMES A DAY AS NEEDED FOR SLEEP OR ANXIETY Patient taking differently: Take 1 mg by mouth 2 (two) times daily as needed for anxiety or sleep. TAKE 1 TABLET THREE TIMES A DAY AS NEEDED FOR SLEEP OR ANXIETY 08/15/15  Yes [name], MD  Artificial Tear Ointment (DRY EYES OP) Apply 2 drops to eye daily as needed.   Yes Historical Provider, MD  aspirin 325 MG EC tablet Take 1 tablet (325 mg total) by mouth every morning. 09/21/15  Yes Velvet Bathe, MD  ATROVENT HFA 17 MCG/ACT inhaler USE 2 PUFFS EVERY FOUR HOURS AS NEEDED 12/05/15  Yes [name], MD  butalbital-acetaminophen-caffeine (FIORICET) [phone] MG tablet Take 1-2 pills as needed for headache Patient taking differently: Take 1 tablet by mouth 2 (two) times daily as needed for headache or migraine. Take 1-2 pills as needed for headache 01/19/16  Yes [name], MD  calcium-vitamin D (OSCAL WITH D) 250-125 MG-UNIT per tablet Take 1 tablet by mouth every morning.    Yes Historical Provider, MD  cholecalciferol (VITAMIN D) 1000 units tablet Take 1,000 Units by mouth daily.   Yes Historical Provider, MD  COMBIVENT RESPIMAT 20-100 MCG/ACT AERS respimat INHALE 1 PUFF INTO THE LUNGS EVERY 6 (SIX) HOURS. Patient taking  differently: INHALE 1 PUFF INTO THE LUNGS EVERY 8 HOURS AS NEEDED. 08/25/15  Yes Juanito Doom, MD  diclofenac sodium (VOLTAREN) 1 % GEL APPLY 4 G TOPICALLY DAILY AS NEEDED (APPLY TO HANDS). HAND PAIN 08/04/15  Yes Historical Provider, MD  furosemide (LASIX) 40 MG tablet TAKE 1 TABLET (40 MG TOTAL) BY MOUTH 2 (TWO) TIMES DAILY. 10/23/15  Yes [name], MD  ipratropium-albuterol (DUONEB) 0.5-2.5 (3) MG/3ML SOLN PUT 1 VIAL INTO NEBILZER 2 TIMES A DAY AS NEEDED DX J44.9 11/27/15  Yes Juanito Doom, MD  lactulose (CHRONULAC) 10 GM/15ML solution Take 30  mLs po tid prn constipation Patient taking differently: Take 20 g by mouth 3 (three) times daily. Take 30 mLs po tid prn constipation 11/27/15  Yes [name], MD  LIVALO 2 MG TABS Take 1 tablet by mouth every evening.  08/25/15  Yes Historical Provider, MD  montelukast (SINGULAIR) 10 MG tablet Take 1 tablet (10 mg total) by mouth at bedtime. 09/01/15  Yes [name], MD  Multiple Vitamins-Minerals (MULTIVITAMIN WITH MINERALS) tablet Take 1 tablet by mouth every morning.    Yes Historical Provider, MD  nortriptyline (PAMELOR) 25 MG capsule Take 2 capsules (50 mg total) by mouth at bedtime. 07/26/15  Yes [name], MD  Omega 3 1200 MG CAPS Take 1,200 mg by mouth every morning.    Yes Historical Provider, MD  ondansetron (ZOFRAN) 8 MG tablet TAKE 1 TABLET BY MOUTH TWO TIMES A DAY AS NEEDED FOR NAUSEA OR VOMITING Patient taking differently: TAKE 1 TABLET BY MOUTH ONCE A DAY AS NEEDED FOR NAUSEA OR VOMITING 10/30/15  Yes [name], MD  pantoprazole (PROTONIX) 40 MG tablet TAKE 1 TABLET BY MOUTH ONCE A DAY 09/30/15  Yes [name], MD  potassium chloride SA (KLOR-CON M20) 20 MEQ tablet Take 1 tablet (20 mEq total) by mouth daily. 12/05/15  Yes [name], MD  SYMBICORT 160-4.5 MCG/ACT inhaler Inhale 2 puffs into the lungs 2 (two) times daily. Patient taking differently: Inhale 1 puff into the lungs 2 (two) times daily.  10/16/15  Yes  [name], MD  OXYGEN-HELIUM IN Inhale 3 L into the lungs See admin instructions. Uses when needed during the day, uses continuous throughout the night    Historical Provider, MD   BP 104/51 mmHg  Pulse 91  Temp(Src) 98.9 F (37.2 C) (Oral)  SpO2 93% Physical Exam  Constitutional: She is oriented to person, place, and time. She appears well-developed and well-nourished. No distress.  HENT:  Head: Normocephalic and atraumatic.  Eyes: Conjunctivae and EOM are normal.  Cardiovascular: Normal rate and regular rhythm.   Pulmonary/Chest: Accessory muscle usage present. No stridor. She has decreased breath sounds. She has wheezes.  Abdominal: She exhibits no distension.  Musculoskeletal: She exhibits no edema.  Neurological: She is alert and oriented to person, place, and time. No cranial nerve deficit.  Skin: Skin is warm and dry.  Psychiatric: She has a normal mood and affect.  Nursing note and vitals reviewed.   ED Course  Procedures (including critical care time) Labs Review Labs Reviewed  COMPREHENSIVE METABOLIC PANEL - Abnormal; Notable for the following:    Chloride 100 (*)    Glucose, Bld 113 (*)    Creatinine, Ser 1.26 (*)    Calcium 8.5 (*)    Albumin 3.2 (*)    Total Bilirubin 0.2 (*)    GFR calc non Af Amer 42 (*)    GFR calc Af Amer 49 (*)    All other components within normal limits  CBC - Abnormal; Notable for the following:    RBC 3.78 (*)    Hemoglobin 11.9 (*)    HCT 35.2 (*)    All other components within normal limits  TROPONIN I  BRAIN NATRIURETIC PEPTIDE    Imaging Review Dg Chest 2 View  02/12/2016  CLINICAL DATA:  Cough and shortness of breath for 4 days EXAM: CHEST  2 VIEW COMPARISON:  September 03, 2015. FINDINGS: There is extensive airspace consolidation throughout the lingula consistent with pneumonia. There is also  some patchy infiltrate in the anterior segment left upper lobe. The right lung is clear. Heart size and pulmonary vascular normal.  No adenopathy. There are surgical clips the right axillary region. There is mild degenerative change in the thoracic spine. IMPRESSION: Extensive airspace consolidation consistent with pneumonia throughout much of the lingula as well as a portion of the anterior segment left upper lobe. Right lung clear. Cardiac silhouette within normal limits. Followup PA and lateral chest radiographs recommended in 3-4 weeks following trial of antibiotic therapy to ensure resolution and exclude underlying malignancy. Electronically Signed   By: Lowella Grip III M.D.   On: 02/12/2016 09:54   I have personally reviewed and evaluated these images and lab results as part of my medical decision-making.   EKG Interpretation   Date/Time:  Thursday Feb 12 2016 09:35:39 EDT Ventricular Rate:  89 PR Interval:  168 QRS Duration: 79 QT Interval:  340 QTC Calculation: 414 R Axis:   33 Text Interpretation:  Sinus rhythm Ventricular premature complex Artifact  Anterior infarct, old Abnormal ekg Reconfirmed by Carmin Muskrat  MD  [phone]) on 02/12/2016 9:46:19 AM Also confirmed by Carmin Muskrat  MD  ([id number]), editor [place], Leda Gauze [phone])  on 02/12/2016 10:04:26 AM     Update: On repeat exam the patient continues to have tachypnea, increased work of breathing. However, the patient's wheezing is diminished, after initial nebulizer treatment.  MDM  Female with multiple medical issues including CHF, COPD presents with dyspnea. Here, the patient is awake, alert, afebrile, but with increased work of breathing, concern for pneumonia versus COPD exacerbation.  No evidence for fluid overloaded state. Patient does have evidence for pneumonia, and given her persistent wheezing, difficulty breathing, after multiple treatments, steroids, and antibiotics she was admitted for further evaluation, management.    Carmin Muskrat, MD 02/12/16 1153

## 2016-02-12 NOTE — ED Notes (Signed)
Per EMS, pt from home.  Pt with shortness of breath and cough x 5 days.  Dizziness with standing x 4 days.  Shortness of breath starting this morning.  HX of CHF and emphysema.  Denies chest pain.  Vitals:  94/56, hr 88, 97-100%, resp 18.  5mg  albuteral neb in route, 20 g LAC

## 2016-02-12 NOTE — Progress Notes (Signed)
Pharmacy Antibiotic Follow-up Note  [name] is a 70 y.o. year-old female admitted on 02/12/2016.  The patient is currently on day 1 of Levaquin for PNA.  Assessment/Plan: This patient's current antibiotics will be continued without adjustments.  Temp (24hrs), Avg:98.6 F (37 C), Min:98.2 F (36.8 C), Max:98.9 F (37.2 C)   Recent Labs Lab 02/12/16 1016  WBC 8.7    Recent Labs Lab 02/12/16 1016  CREATININE 1.26*   CrCl cannot be calculated (Unknown ideal weight.).    Allergies  Allergen Reactions  . Ceftriaxone Anaphylaxis and Other (See Comments)    *ROCEPHIN*  "Blow up like a balloon"  . Hydroxyzine Shortness Of Breath and Other (See Comments)    Pt states med make her light headed, get sob sxs  . Lexapro [Escitalopram] Other (See Comments)    Pt states med make her dizzy   Antimicrobials this admission: 5/18 Levaquin >>   Levels/dose changes this admission:  Microbiology results: 5/18 BCx: pending   Thank you for allowing pharmacy to be a part of this patient's care.  Minda Ditto PharmD 02/12/2016 6:55 PM

## 2016-02-12 NOTE — ED Notes (Signed)
Notified for peak flow respiratory

## 2016-02-13 DIAGNOSIS — I5032 Chronic diastolic (congestive) heart failure: Secondary | ICD-10-CM

## 2016-02-13 DIAGNOSIS — J181 Lobar pneumonia, unspecified organism: Secondary | ICD-10-CM

## 2016-02-13 DIAGNOSIS — J441 Chronic obstructive pulmonary disease with (acute) exacerbation: Secondary | ICD-10-CM | POA: Insufficient documentation

## 2016-02-13 DIAGNOSIS — K219 Gastro-esophageal reflux disease without esophagitis: Secondary | ICD-10-CM

## 2016-02-13 DIAGNOSIS — Z720 Tobacco use: Secondary | ICD-10-CM

## 2016-02-13 LAB — CBC
HCT: 36.4 % (ref 36.0–46.0)
Hemoglobin: 12.1 g/dL (ref 12.0–15.0)
MCH: 30.9 pg (ref 26.0–34.0)
MCHC: 33.2 g/dL (ref 30.0–36.0)
MCV: 92.9 fL (ref 78.0–100.0)
Platelets: 191 10*3/uL (ref 150–400)
RBC: 3.92 MIL/uL (ref 3.87–5.11)
RDW: 13.7 % (ref 11.5–15.5)
WBC: 5.8 10*3/uL (ref 4.0–10.5)

## 2016-02-13 LAB — BASIC METABOLIC PANEL
Anion gap: 7 (ref 5–15)
BUN: 15 mg/dL (ref 6–20)
CO2: 28 mmol/L (ref 22–32)
Calcium: 8.3 mg/dL — ABNORMAL LOW (ref 8.9–10.3)
Chloride: 102 mmol/L (ref 101–111)
Creatinine, Ser: 0.89 mg/dL (ref 0.44–1.00)
GFR calc Af Amer: >60 mL/min (ref >60)
GFR calc non Af Amer: >60 mL/min (ref >60)
Glucose, Bld: 131 mg/dL — ABNORMAL HIGH (ref 65–99)
Potassium: 4.5 mmol/L (ref 3.5–5.1)
Sodium: 137 mmol/L (ref 135–145)

## 2016-02-13 LAB — STREP PNEUMONIAE URINARY ANTIGEN: Strep Pneumo Urinary Antigen: NEGATIVE

## 2016-02-13 LAB — HIV ANTIBODY (ROUTINE TESTING W REFLEX): HIV Screen 4th Generation wRfx: NONREACTIVE

## 2016-02-13 MED ORDER — ARFORMOTEROL TARTRATE 15 MCG/2ML IN NEBU
15.0000 ug | INHALATION_SOLUTION | Freq: Two times a day (BID) | RESPIRATORY_TRACT | Status: DC
  Administered 2016-02-13 (×2): 15 ug via RESPIRATORY_TRACT
  Filled 2016-02-13 (×9): qty 2

## 2016-02-13 MED ORDER — GUAIFENESIN ER 600 MG PO TB12
1200.0000 mg | ORAL_TABLET | Freq: Two times a day (BID) | ORAL | Status: DC
  Administered 2016-02-13 – 2016-02-16 (×7): 1200 mg via ORAL
  Filled 2016-02-13 (×7): qty 2

## 2016-02-13 MED ORDER — BISACODYL 5 MG PO TBEC
5.0000 mg | DELAYED_RELEASE_TABLET | Freq: Once | ORAL | Status: AC
  Administered 2016-02-13: 5 mg via ORAL
  Filled 2016-02-13: qty 1

## 2016-02-13 MED ORDER — CALCIUM CARBONATE-VITAMIN D 500-200 MG-UNIT PO TABS
1.0000 | ORAL_TABLET | Freq: Every morning | ORAL | Status: DC
  Administered 2016-02-13 – 2016-02-16 (×4): 1 via ORAL
  Filled 2016-02-13 (×4): qty 1

## 2016-02-13 MED ORDER — SODIUM PHOSPHATES 45 MMOLE/15ML IV SOLN
30.0000 mmol | Freq: Once | INTRAVENOUS | Status: AC
  Administered 2016-02-13: 30 mmol via INTRAVENOUS
  Filled 2016-02-13: qty 10

## 2016-02-13 MED ORDER — POTASSIUM PHOSPHATES 15 MMOLE/5ML IV SOLN: 30.0000 mmol | Freq: Once | INTRAVENOUS | Status: DC

## 2016-02-13 MED ORDER — LORATADINE 10 MG PO TABS: 10.0000 mg | ORAL_TABLET | Freq: Every day | ORAL | Status: DC

## 2016-02-13 MED ORDER — CALCIUM CARBONATE-VITAMIN D 500-200 MG-UNIT PO TABS: 1.0000 | ORAL_TABLET | Freq: Every morning | ORAL | Status: DC

## 2016-02-13 MED ORDER — FLUTICASONE PROPIONATE 50 MCG/ACT NA SUSP
2.0000 | Freq: Every day | NASAL | Status: DC
  Administered 2016-02-13 – 2016-02-16 (×4): 2 via NASAL
  Filled 2016-02-13: qty 16

## 2016-02-13 NOTE — Progress Notes (Signed)
PROGRESS NOTE    [name]  [id number] DOB: 1945-11-05 DOA: 02/12/2016 PCP: Scarlette Calico, MD    Assessment & Plan:   Principal Problem:   Lobar pneumonia due to unspecified organism Active Problems:   COPD (chronic obstructive pulmonary disease) ([place])   Hyperlipidemia with target LDL less than 70   Tobacco abuse   Chronic diastolic CHF (congestive heart failure) (HCC)   Chronic respiratory failure (HCC)   HTN (hypertension)   Gastroesophageal reflux disease without esophagitis   CAP (community acquired pneumonia)  #1 lobar pneumonia/CAP Clinical improvement. Urine strep pneumococcus antigen negative. Urine Legionella antigen pending. Sputum Gram stain and cultures pending. Blood cultures pending. Patient is afebrile. Patient with a normal white count. Continue empiric IV Levaquin, nebulizer treatments, oxygen. Increase Mucinex to 1200 mg twice a day.  #2 acute COPD exacerbation Likely triggered by problem #1. Patient with clinical improvement. Continue oxygen, IV Levaquin, scheduled nebulizers, Mucinex, Pulmicort, Singulair. Add Flonase and Brovana. Continue PPI. Continue IV steroid taper. Follow.  #3 tobacco abuse Tobacco cessation.  #4 hypertension Blood pressure borderline. Continue Lasix.  #5 gastroesophageal reflux disease PPI.  #6 constipation Continue home regimen lactulose.  #7 hypophosphatemia Replete.  #8 chronic diastolic heart failure Stable. Patient currently euvolemic. Continue Lasix, aspirin.    DVT prophylaxis: Heparin Code Status: Full Family Communication: Updated patient. No family present. Disposition Plan: Home when clinically improved and on oral antibiotics and oral steroid taper.   Consultants:   None  Procedures:   Chest x-ray 02/12/2016  Antimicrobials:   IV Levaquin 02/12/2016   Subjective: Patient states breathing is improving. Patient states cough is now more productive after nebulizer treatments. No chest pain.  Patient still hadn't had a bowel movement in the past 1-2 days.  Objective: Filed Vitals:   02/12/16 2136 02/13/16 0529 02/13/16 0802 02/13/16 1403  BP: 112/88 114/88  104/55  Pulse: 80 83  88  Temp:  97.6 F (36.4 C)  98.2 F (36.8 C)  TempSrc: Oral Oral  Oral  Resp: 20 17  20   Height:   5\' 2"  (1.575 m)   Weight:   55.974 kg (123 lb 6.4 oz)   SpO2: 95% 94%  99%    Intake/Output Summary (Last 24 hours) at 02/13/16 1508 Last data filed at 02/13/16 0950  Gross per 24 hour  Intake 992.67 ml  Output      0 ml  Net 992.67 ml   Filed Weights   02/13/16 0802  Weight: 55.974 kg (123 lb 6.4 oz)    Examination:  General exam: Appears calm and comfortable  Respiratory system: Minimal inspiratory and expiratory wheezing. No crackles. Cardiovascular system: S1 & S2 heard, RRR. No JVD, murmurs, rubs, gallops or clicks. No pedal edema. Gastrointestinal system: Abdomen is nondistended, soft and nontender. Reducible hernia. Normal bowel sounds heard. Central nervous system: Alert and oriented. No focal neurological deficits. Extremities: Symmetric 5 x 5 power. Skin: No rashes, lesions or ulcers Psychiatry: Judgement and insight appear normal. Mood & affect appropriate.     Data Reviewed: I have personally reviewed following labs and imaging studies  CBC:  Recent Labs Lab 02/12/16 1016 02/13/16 0523  WBC 8.7 5.8  HGB 11.9* 12.1  HCT 35.2* 36.4  MCV 93.1 92.9  PLT 175 [id number]   Basic Metabolic Panel:  Recent Labs Lab 02/12/16 1016 02/12/16 1858 02/13/16 0523  NA 135  --  137  K 4.0  --  4.5  CL 100*  --  102  CO2 27  --  28  GLUCOSE 113*  --  131*  BUN 17  --  15  CREATININE 1.26*  --  0.89  CALCIUM 8.5*  --  8.3*  MG  --  1.8  --   PHOS  --  1.9*  --    GFR: Estimated Creatinine Clearance: 46.5 mL/min (by C-G formula based on Cr of 0.89). Liver Function Tests:  Recent Labs Lab 02/12/16 1016  AST 31  ALT 29  ALKPHOS 61  BILITOT 0.2*  PROT 7.1  ALBUMIN  3.2*   No results for input(s): LIPASE, AMYLASE in the last 168 hours. No results for input(s): AMMONIA in the last 168 hours. Coagulation Profile: No results for input(s): INR, PROTIME in the last 168 hours. Cardiac Enzymes:  Recent Labs Lab 02/12/16 1016  TROPONINI <0.03   BNP (last 3 results) No results for input(s): PROBNP in the last 8760 hours. HbA1C: No results for input(s): HGBA1C in the last 72 hours. CBG: No results for input(s): GLUCAP in the last 168 hours. Lipid Profile: No results for input(s): CHOL, HDL, LDLCALC, TRIG, CHOLHDL, LDLDIRECT in the last 72 hours. Thyroid Function Tests:  Recent Labs  02/12/16 1858  TSH 0.309*   Anemia Panel: No results for input(s): VITAMINB12, FOLATE, FERRITIN, TIBC, IRON, RETICCTPCT in the last 72 hours. Sepsis Labs: No results for input(s): PROCALCITON, LATICACIDVEN in the last 168 hours.  Recent Results (from the past 240 hour(s))  Culture, blood (routine x 2) Call MD if unable to obtain prior to antibiotics being given     Status: None (Preliminary result)   Collection Time: 02/12/16  6:34 PM  Result Value Ref Range Status   Specimen Description BLOOD LEFT ARM  Final   Special Requests BOTTLES DRAWN AEROBIC AND ANAEROBIC 5CC  Final   Culture   Final    NO GROWTH < 24 HOURS Performed at [facility]    Report Status PENDING  Incomplete  Culture, blood (routine x 2) Call MD if unable to obtain prior to antibiotics being given     Status: None (Preliminary result)   Collection Time: 02/12/16  6:58 PM  Result Value Ref Range Status   Specimen Description BLOOD LEFT HAND  Final   Special Requests IN PEDIATRIC BOTTLE 3CC  Final   Culture   Final    NO GROWTH < 24 HOURS Performed at [facility]    Report Status PENDING  Incomplete         Radiology Studies: Dg Chest 2 View  02/12/2016  CLINICAL DATA:  Cough and shortness of breath for 4 days EXAM: CHEST  2 VIEW COMPARISON:  September 03, 2015.  FINDINGS: There is extensive airspace consolidation throughout the lingula consistent with pneumonia. There is also some patchy infiltrate in the anterior segment left upper lobe. The right lung is clear. Heart size and pulmonary vascular normal. No adenopathy. There are surgical clips the right axillary region. There is mild degenerative change in the thoracic spine. IMPRESSION: Extensive airspace consolidation consistent with pneumonia throughout much of the lingula as well as a portion of the anterior segment left upper lobe. Right lung clear. Cardiac silhouette within normal limits. Followup PA and lateral chest radiographs recommended in 3-4 weeks following trial of antibiotic therapy to ensure resolution and exclude underlying malignancy. Electronically Signed   By: Lowella Grip III M.D.   On: 02/12/2016 09:54        Scheduled Meds: . arformoterol  15 mcg Nebulization BID  . aspirin  325 mg Oral q morning - 10a  . budesonide (PULMICORT) nebulizer solution  0.25 mg Nebulization BID  . [START ON 02/14/2016] calcium-vitamin D  1 tablet Oral q morning - 10a  . cholecalciferol  1,000 Units Oral Daily  . fluticasone  2 spray Each Nare Daily  . furosemide  40 mg Oral Daily  . guaiFENesin  1,200 mg Oral BID  . heparin  5,000 Units Subcutaneous Q8H  . ipratropium-albuterol  3 mL Nebulization TID  . lactulose  20 g Oral TID  . levofloxacin (LEVAQUIN) IV  750 mg Intravenous Q24H  . methylPREDNISolone (SOLU-MEDROL) injection  60 mg Intravenous Q8H  . montelukast  10 mg Oral QHS  . multivitamin with minerals  1 tablet Oral q morning - 10a  . nortriptyline  50 mg Oral QHS  . omega-3 acid ethyl esters  1,000 mg Oral q morning - 10a  . pantoprazole  40 mg Oral Daily  . potassium chloride SA  20 mEq Oral Daily  . sodium phosphate  Dextrose 5% IVPB  30 mmol Intravenous Once   Continuous Infusions: . sodium chloride 20 mL/hr at 02/12/16 1922     LOS: 1 day    Time spent: 97  mins    Laird Runnion, MD [facility] Pager [phone]  If 7PM-7AM, please contact night-coverage [email or web address] Password [email or web address] 02/13/2016, 3:08 PM

## 2016-02-13 NOTE — Progress Notes (Signed)
Initial Nutrition Assessment  DOCUMENTATION CODES:   Not applicable  INTERVENTION:  -RD to continue to monitor  NUTRITION DIAGNOSIS:   Increased nutrient needs related to chronic illness as evidenced by estimated needs.  GOAL:   Patient will meet greater than or equal to 90% of their needs  MONITOR:   Labs, I & O's, Skin, Weight trends, PO intake  REASON FOR ASSESSMENT:   Malnutrition Screening Tool    ASSESSMENT:   [name] is a 70 y.o. female with PMH significant for HTN, diastolic heart failure, COPD, chronic resp failure, tobacco abuse, depression/anxiety and dyslipidemia: who presented to ED secondary to productive cough, chills and worsening SOB/wheezing. Patient symptoms has been present for the last 3-4 days and worsening  Spoke with [name], daughter at bedside. Pt reports good appetite, eating 100% of meals. Had a tray with her during my visit, salad with ham and cheese, pt had consumed most of. Pt also showed me food her daughter had brought her from outside. She has no complaints with [facility] diet "I like hospital food."  She denies weight loss; per chart pt's weight is down 6#/4.6% over 16 months.  Nutrition-Focused physical exam completed. Findings are moderate fat depletion, moderate muscle depletion, and no edema.   She is otherwise ok.  Labs and medications reviewed.   Diet Order:  Diet Heart Room service appropriate?: Yes; Fluid consistency:: Thin  Skin:  Reviewed, no issues  Last BM:  02/11/2016  Height:   Ht Readings from Last 1 Encounters:  02/13/16 5\' 2"  (1.575 m)    Weight:   Wt Readings from Last 1 Encounters:  02/13/16 123 lb 6.4 oz (55.974 kg)    Ideal Body Weight:  50 kg  BMI:  Body mass index is 22.56 kg/(m^2).  Estimated Nutritional Needs:   Kcal:  1400-1650 calories  Protein:  55-65 grams  Fluid:  1.2 L  EDUCATION NEEDS:   No education needs identified at this time  Satira Anis. Jennesis Ramaswamy, Barbara, RD  LDN Inpatient Clinical Dietitian Pager [phone]

## 2016-02-13 NOTE — Progress Notes (Signed)
Pt refused to do flutter valve at this time.  Pt states she has one at home and might try it tomorrow.

## 2016-02-14 DIAGNOSIS — I10 Essential (primary) hypertension: Secondary | ICD-10-CM

## 2016-02-14 DIAGNOSIS — J189 Pneumonia, unspecified organism: Secondary | ICD-10-CM

## 2016-02-14 DIAGNOSIS — E785 Hyperlipidemia, unspecified: Secondary | ICD-10-CM

## 2016-02-14 LAB — BASIC METABOLIC PANEL
Anion gap: 6 (ref 5–15)
BUN: 14 mg/dL (ref 6–20)
CO2: 29 mmol/L (ref 22–32)
Calcium: 8.1 mg/dL — ABNORMAL LOW (ref 8.9–10.3)
Chloride: 105 mmol/L (ref 101–111)
Creatinine, Ser: 0.83 mg/dL (ref 0.44–1.00)
GFR calc Af Amer: >60 mL/min (ref >60)
GFR calc non Af Amer: >60 mL/min (ref >60)
Glucose, Bld: 117 mg/dL — ABNORMAL HIGH (ref 65–99)
Potassium: 3.9 mmol/L (ref 3.5–5.1)
Sodium: 140 mmol/L (ref 135–145)

## 2016-02-14 LAB — CBC
HCT: 34 % — ABNORMAL LOW (ref 36.0–46.0)
Hemoglobin: 11.2 g/dL — ABNORMAL LOW (ref 12.0–15.0)
MCH: 31.5 pg (ref 26.0–34.0)
MCHC: 32.9 g/dL (ref 30.0–36.0)
MCV: 95.8 fL (ref 78.0–100.0)
Platelets: 215 10*3/uL (ref 150–400)
RBC: 3.55 MIL/uL — ABNORMAL LOW (ref 3.87–5.11)
RDW: 14.1 % (ref 11.5–15.5)
WBC: 7.3 10*3/uL (ref 4.0–10.5)

## 2016-02-14 LAB — EXPECTORATED SPUTUM ASSESSMENT W GRAM STAIN, RFLX TO RESP C

## 2016-02-14 LAB — PHOSPHORUS: Phosphorus: 2.4 mg/dL — ABNORMAL LOW (ref 2.5–4.6)

## 2016-02-14 MED ORDER — SODIUM PHOSPHATES 45 MMOLE/15ML IV SOLN: 20.0000 mmol | Freq: Once | INTRAVENOUS | Status: DC

## 2016-02-14 MED ORDER — METHYLPREDNISOLONE SODIUM SUCC 125 MG IJ SOLR
60.0000 mg | Freq: Two times a day (BID) | INTRAMUSCULAR | Status: DC
  Administered 2016-02-14 – 2016-02-15 (×2): 60 mg via INTRAVENOUS
  Filled 2016-02-14 (×4): qty 0.96

## 2016-02-14 MED ORDER — SODIUM PHOSPHATES 45 MMOLE/15ML IV SOLN
10.0000 mmol | Freq: Once | INTRAVENOUS | Status: AC
  Administered 2016-02-14: 10 mmol via INTRAVENOUS
  Filled 2016-02-14: qty 3.33

## 2016-02-14 MED ORDER — LEVOFLOXACIN 750 MG PO TABS
750.0000 mg | ORAL_TABLET | ORAL | Status: DC
  Administered 2016-02-14 – 2016-02-15 (×2): 750 mg via ORAL
  Filled 2016-02-14 (×3): qty 1

## 2016-02-14 NOTE — Progress Notes (Signed)
PROGRESS NOTE    [name]  [id number] DOB: 31-May-1946 DOA: 02/12/2016 PCP: Scarlette Calico, MD    Assessment & Plan:   Principal Problem:   Lobar pneumonia due to unspecified organism Active Problems:   COPD (chronic obstructive pulmonary disease) ([place])   Hyperlipidemia with target LDL less than 70   Tobacco abuse   Chronic diastolic CHF (congestive heart failure) ([facility])   Chronic respiratory failure ([facility])   HTN (hypertension)   Gastroesophageal reflux disease without esophagitis   CAP (community acquired pneumonia)   COPD exacerbation ([place])  #1 lobar pneumonia/CAP Clinical improvement. Urine strep pneumococcus antigen negative. Urine Legionella antigen pending. Sputum Gram stain and cultures pending. Blood cultures pending. Patient is afebrile. Patient with a normal white count. Change IV Levaquin to oral Levaquin. Continue nebulizer treatments, oxygen, Mucinex.  #2 acute COPD exacerbation Likely triggered by problem #1. Patient with clinical improvement. Continue oxygen, scheduled nebulizers, Mucinex, Pulmicort, Singulair, Flonase and Brovana, PPI. Continue IV steroid taper. Change IV Levaquin to oral Levaquin. Follow.  #3 tobacco abuse Tobacco cessation.  #4 hypertension Blood pressure borderline. Continue Lasix.  #5 gastroesophageal reflux disease PPI.  #6 constipation Patient stated had bowel movement yesterday. Continue home regimen lactulose.  #7 hypophosphatemia Replete.  #8 chronic diastolic heart failure Stable. Patient currently euvolemic. Continue Lasix, aspirin.    DVT prophylaxis: Heparin Code Status: Full Family Communication: Updated patient. No family present. Disposition Plan: Home when clinically improved and on oral antibiotics and oral steroid taper on Monday, 02/16/2016.   Consultants:   None  Procedures:   Chest x-ray 02/12/2016  Antimicrobials:   IV Levaquin 02/12/2016>>> oral Levaquin 02/14/2016   Subjective: Patient  states breathing is improving. Patient states cough improving. No chest pain. Patient stated had bowel movement.   Objective: Filed Vitals:   02/13/16 1403 02/14/16 0446 02/14/16 0450 02/14/16 0856  BP: 104/55 117/60    Pulse: 88 82    Temp: 98.2 F (36.8 C) 98.1 F (36.7 C)    TempSrc: Oral Oral    Resp: 20 18    Height:   5\' 2"  (1.575 m)   Weight:   57.834 kg (127 lb 8 oz)   SpO2: 99% 97%  94%    Intake/Output Summary (Last 24 hours) at 02/14/16 1136 Last data filed at 02/14/16 1000  Gross per 24 hour  Intake      0 ml  Output    250 ml  Net   -250 ml   Filed Weights   02/13/16 0802 02/14/16 0450  Weight: 55.974 kg (123 lb 6.4 oz) 57.834 kg (127 lb 8 oz)    Examination:  General exam: Appears calm and comfortable  Respiratory system: Minimal inspiratory and expiratory wheezing. No crackles. Cardiovascular system: S1 & S2 heard, RRR. No JVD, murmurs, rubs, gallops or clicks. No pedal edema. Gastrointestinal system: Abdomen is nondistended, soft and nontender. Reducible hernia. Normal bowel sounds heard. Central nervous system: Alert and oriented. No focal neurological deficits. Extremities: Symmetric 5 x 5 power. Skin: No rashes, lesions or ulcers Psychiatry: Judgement and insight appear normal. Mood & affect appropriate.     Data Reviewed: I have personally reviewed following labs and imaging studies  CBC:  Recent Labs Lab 02/12/16 1016 02/13/16 0523 02/14/16 0541  WBC 8.7 5.8 7.3  HGB 11.9* 12.1 11.2*  HCT 35.2* 36.4 34.0*  MCV 93.1 92.9 95.8  PLT 175 191 [id number]   Basic Metabolic Panel:  Recent Labs Lab 02/12/16 1016 02/12/16 1858 02/13/16 0523 02/14/16  0541  NA 135  --  137 140  K 4.0  --  4.5 3.9  CL 100*  --  102 105  CO2 27  --  28 29  GLUCOSE 113*  --  131* 117*  BUN 17  --  15 14  CREATININE 1.26*  --  0.89 0.83  CALCIUM 8.5*  --  8.3* 8.1*  MG  --  1.8  --   --   PHOS  --  1.9*  --  2.4*   GFR: Estimated Creatinine Clearance: 49.9  mL/min (by C-G formula based on Cr of 0.83). Liver Function Tests:  Recent Labs Lab 02/12/16 1016  AST 31  ALT 29  ALKPHOS 61  BILITOT 0.2*  PROT 7.1  ALBUMIN 3.2*   No results for input(s): LIPASE, AMYLASE in the last 168 hours. No results for input(s): AMMONIA in the last 168 hours. Coagulation Profile: No results for input(s): INR, PROTIME in the last 168 hours. Cardiac Enzymes:  Recent Labs Lab 02/12/16 1016  TROPONINI <0.03   BNP (last 3 results) No results for input(s): PROBNP in the last 8760 hours. HbA1C: No results for input(s): HGBA1C in the last 72 hours. CBG: No results for input(s): GLUCAP in the last 168 hours. Lipid Profile: No results for input(s): CHOL, HDL, LDLCALC, TRIG, CHOLHDL, LDLDIRECT in the last 72 hours. Thyroid Function Tests:  Recent Labs  02/12/16 1858  TSH 0.309*   Anemia Panel: No results for input(s): VITAMINB12, FOLATE, FERRITIN, TIBC, IRON, RETICCTPCT in the last 72 hours. Sepsis Labs: No results for input(s): PROCALCITON, LATICACIDVEN in the last 168 hours.  Recent Results (from the past 240 hour(s))  Culture, blood (routine x 2) Call MD if unable to obtain prior to antibiotics being given     Status: None (Preliminary result)   Collection Time: 02/12/16  6:34 PM  Result Value Ref Range Status   Specimen Description BLOOD LEFT ARM  Final   Special Requests BOTTLES DRAWN AEROBIC AND ANAEROBIC 5CC  Final   Culture   Final    NO GROWTH 2 DAYS Performed at [facility]    Report Status PENDING  Incomplete  Culture, blood (routine x 2) Call MD if unable to obtain prior to antibiotics being given     Status: None (Preliminary result)   Collection Time: 02/12/16  6:58 PM  Result Value Ref Range Status   Specimen Description BLOOD LEFT HAND  Final   Special Requests IN PEDIATRIC BOTTLE 3CC  Final   Culture   Final    NO GROWTH 2 DAYS Performed at [facility]    Report Status PENDING  Incomplete          Radiology Studies: No results found.      Scheduled Meds: . arformoterol  15 mcg Nebulization BID  . aspirin  325 mg Oral q morning - 10a  . budesonide (PULMICORT) nebulizer solution  0.25 mg Nebulization BID  . calcium-vitamin D  1 tablet Oral q morning - 10a  . cholecalciferol  1,000 Units Oral Daily  . fluticasone  2 spray Each Nare Daily  . furosemide  40 mg Oral Daily  . guaiFENesin  1,200 mg Oral BID  . heparin  5,000 Units Subcutaneous Q8H  . ipratropium-albuterol  3 mL Nebulization TID  . lactulose  20 g Oral TID  . levofloxacin (LEVAQUIN) IV  750 mg Intravenous Q24H  . methylPREDNISolone (SOLU-MEDROL) injection  60 mg Intravenous Q12H  . montelukast  10 mg  Oral QHS  . multivitamin with minerals  1 tablet Oral q morning - 10a  . nortriptyline  50 mg Oral QHS  . omega-3 acid ethyl esters  1,000 mg Oral q morning - 10a  . pantoprazole  40 mg Oral Daily  . potassium chloride SA  20 mEq Oral Daily  . sodium phosphate  Dextrose 5% IVPB  10 mmol Intravenous Once   Continuous Infusions:     LOS: 2 days    Time spent: 41 mins    Nandan Willems, MD [facility] Pager [phone]  If 7PM-7AM, please contact night-coverage [email or web address] Password [facility] 02/14/2016, 11:36 AM

## 2016-02-15 LAB — BASIC METABOLIC PANEL
Anion gap: 5 (ref 5–15)
BUN: 18 mg/dL (ref 6–20)
CO2: 32 mmol/L (ref 22–32)
Calcium: 8.7 mg/dL — ABNORMAL LOW (ref 8.9–10.3)
Chloride: 102 mmol/L (ref 101–111)
Creatinine, Ser: 0.92 mg/dL (ref 0.44–1.00)
GFR calc Af Amer: >60 mL/min (ref >60)
GFR calc non Af Amer: >60 mL/min (ref >60)
Glucose, Bld: 131 mg/dL — ABNORMAL HIGH (ref 65–99)
Potassium: 4.7 mmol/L (ref 3.5–5.1)
Sodium: 139 mmol/L (ref 135–145)

## 2016-02-15 LAB — PHOSPHORUS: Phosphorus: 2.3 mg/dL — ABNORMAL LOW (ref 2.5–4.6)

## 2016-02-15 LAB — CBC
HCT: 35.9 % — ABNORMAL LOW (ref 36.0–46.0)
Hemoglobin: 11.6 g/dL — ABNORMAL LOW (ref 12.0–15.0)
MCH: 31.4 pg (ref 26.0–34.0)
MCHC: 32.3 g/dL (ref 30.0–36.0)
MCV: 97.3 fL (ref 78.0–100.0)
Platelets: 198 10*3/uL (ref 150–400)
RBC: 3.69 MIL/uL — ABNORMAL LOW (ref 3.87–5.11)
RDW: 14.1 % (ref 11.5–15.5)
WBC: 3.4 10*3/uL — ABNORMAL LOW (ref 4.0–10.5)

## 2016-02-15 MED ORDER — SODIUM PHOSPHATES 45 MMOLE/15ML IV SOLN
20.0000 mmol | Freq: Once | INTRAVENOUS | Status: AC
  Administered 2016-02-15: 20 mmol via INTRAVENOUS
  Filled 2016-02-15: qty 6.67

## 2016-02-15 MED ORDER — PREDNISONE 10 MG PO TABS
60.0000 mg | ORAL_TABLET | Freq: Every day | ORAL | Status: DC
  Administered 2016-02-16: 60 mg via ORAL
  Filled 2016-02-15 (×2): qty 1

## 2016-02-15 NOTE — Progress Notes (Signed)
PROGRESS NOTE    [name]  [id number] DOB: Jun 12, 1946 DOA: 02/12/2016 PCP: Scarlette Calico, MD    Assessment & Plan:   Principal Problem:   Lobar pneumonia due to unspecified organism Active Problems:   COPD (chronic obstructive pulmonary disease) ([place])   Hyperlipidemia with target LDL less than 70   Tobacco abuse   Chronic diastolic CHF (congestive heart failure) ([facility])   Chronic respiratory failure ([facility])   HTN (hypertension)   Gastroesophageal reflux disease without esophagitis   CAP (community acquired pneumonia)   COPD exacerbation ([place])  #1 lobar pneumonia/CAP Clinical improvement. Urine strep pneumococcus antigen negative. Urine Legionella antigen pending. Sputum Gram stain and cultures pending. Blood cultures pending. Patient is afebrile. Patient with a normal white count. Continue oral Levaquin. Continue nebulizer treatments, oxygen, Mucinex.  #2 acute COPD exacerbation Likely triggered by problem #1. Patient with clinical improvement. Continue oxygen, scheduled nebulizers, Mucinex, Pulmicort, Singulair, Flonase and Brovana, PPI, oral Levaquin. Change IV steroids to oral prednisone taper. Follow.  #3 tobacco abuse Tobacco cessation.  #4 hypertension Blood pressure borderline. Continue Lasix.  #5 gastroesophageal reflux disease PPI.  #6 constipation Patient states had bowel movement. Continue home regimen lactulose.  #7 hypophosphatemia Replete.  #8 chronic diastolic heart failure Stable. Patient currently euvolemic. Continue Lasix, aspirin.    DVT prophylaxis: Heparin Code Status: Full Family Communication: Updated patient. No family present. Disposition Plan: Home when clinically improved and on oral antibiotics and oral steroid taper on Monday, 02/16/2016.   Consultants:   None  Procedures:   Chest x-ray 02/12/2016  Antimicrobials:   IV Levaquin 02/12/2016>>> oral Levaquin 02/14/2016   Subjective: Patient sitting up at the side of  the bed coloring pictures. Patient states shortness of breath has improved. No chest pain. Coughing improved. Feeling better.  Objective: Filed Vitals:   02/14/16 2134 02/15/16 0602 02/15/16 0829 02/15/16 1335  BP:  111/60  134/79  Pulse:  80  93  Temp:  98.2 F (36.8 C)  98 F (36.7 C)  TempSrc:  Oral  Oral  Resp:    20  Height:  5\' 2"  (1.575 m)    Weight:  58.015 kg (127 lb 14.4 oz)    SpO2: 97% 93% 96% 99%    Intake/Output Summary (Last 24 hours) at 02/15/16 1644 Last data filed at 02/15/16 1300  Gross per 24 hour  Intake    720 ml  Output    400 ml  Net    320 ml   Filed Weights   02/13/16 0802 02/14/16 0450 02/15/16 0602  Weight: 55.974 kg (123 lb 6.4 oz) 57.834 kg (127 lb 8 oz) 58.015 kg (127 lb 14.4 oz)    Examination:  General exam: Appears calm and comfortable  Respiratory system: Minimal inspiratory and expiratory wheezing. No crackles. Cardiovascular system: S1 & S2 heard, RRR. No JVD, murmurs, rubs, gallops or clicks. No pedal edema. Gastrointestinal system: Abdomen is nondistended, soft and nontender. Reducible hernia. Normal bowel sounds heard. Central nervous system: Alert and oriented. No focal neurological deficits. Extremities: Symmetric 5 x 5 power. Skin: No rashes, lesions or ulcers Psychiatry: Judgement and insight appear normal. Mood & affect appropriate.     Data Reviewed: I have personally reviewed following labs and imaging studies  CBC:  Recent Labs Lab 02/12/16 1016 02/13/16 0523 02/14/16 0541 02/15/16 0556  WBC 8.7 5.8 7.3 3.4*  HGB 11.9* 12.1 11.2* 11.6*  HCT 35.2* 36.4 34.0* 35.9*  MCV 93.1 92.9 95.8 97.3  PLT 175 191 215 198  Basic Metabolic Panel:  Recent Labs Lab 02/12/16 1016 02/12/16 1858 02/13/16 0523 02/14/16 0541 02/15/16 0556  NA 135  --  137 140 139  K 4.0  --  4.5 3.9 4.7  CL 100*  --  102 105 102  CO2 27  --  28 29 32  GLUCOSE 113*  --  131* 117* 131*  BUN 17  --  15 14 18   CREATININE 1.26*  --  0.89  0.83 0.92  CALCIUM 8.5*  --  8.3* 8.1* 8.7*  MG  --  1.8  --   --   --   PHOS  --  1.9*  --  2.4* 2.3*   GFR: Estimated Creatinine Clearance: 45 mL/min (by C-G formula based on Cr of 0.92). Liver Function Tests:  Recent Labs Lab 02/12/16 1016  AST 31  ALT 29  ALKPHOS 61  BILITOT 0.2*  PROT 7.1  ALBUMIN 3.2*   No results for input(s): LIPASE, AMYLASE in the last 168 hours. No results for input(s): AMMONIA in the last 168 hours. Coagulation Profile: No results for input(s): INR, PROTIME in the last 168 hours. Cardiac Enzymes:  Recent Labs Lab 02/12/16 1016  TROPONINI <0.03   BNP (last 3 results) No results for input(s): PROBNP in the last 8760 hours. HbA1C: No results for input(s): HGBA1C in the last 72 hours. CBG: No results for input(s): GLUCAP in the last 168 hours. Lipid Profile: No results for input(s): CHOL, HDL, LDLCALC, TRIG, CHOLHDL, LDLDIRECT in the last 72 hours. Thyroid Function Tests:  Recent Labs  02/12/16 1858  TSH 0.309*   Anemia Panel: No results for input(s): VITAMINB12, FOLATE, FERRITIN, TIBC, IRON, RETICCTPCT in the last 72 hours. Sepsis Labs: No results for input(s): PROCALCITON, LATICACIDVEN in the last 168 hours.  Recent Results (from the past 240 hour(s))  Culture, blood (routine x 2) Call MD if unable to obtain prior to antibiotics being given     Status: None (Preliminary result)   Collection Time: 02/12/16  6:34 PM  Result Value Ref Range Status   Specimen Description BLOOD LEFT ARM  Final   Special Requests BOTTLES DRAWN AEROBIC AND ANAEROBIC 5CC  Final   Culture   Final    NO GROWTH 3 DAYS Performed at [facility]    Report Status PENDING  Incomplete  Culture, blood (routine x 2) Call MD if unable to obtain prior to antibiotics being given     Status: None (Preliminary result)   Collection Time: 02/12/16  6:58 PM  Result Value Ref Range Status   Specimen Description BLOOD LEFT HAND  Final   Special Requests IN  PEDIATRIC BOTTLE 3CC  Final   Culture   Final    NO GROWTH 3 DAYS Performed at [facility]    Report Status PENDING  Incomplete  Culture, sputum-assessment     Status: None   Collection Time: 02/14/16  2:00 PM  Result Value Ref Range Status   Specimen Description SPUTUM  Final   Special Requests NONE  Final   Sputum evaluation   Final    THIS SPECIMEN IS ACCEPTABLE. RESPIRATORY CULTURE REPORT TO FOLLOW.   Report Status 02/14/2016 FINAL  Final         Radiology Studies: No results found.      Scheduled Meds: . arformoterol  15 mcg Nebulization BID  . aspirin  325 mg Oral q morning - 10a  . budesonide (PULMICORT) nebulizer solution  0.25 mg Nebulization BID  . calcium-vitamin  D  1 tablet Oral q morning - 10a  . cholecalciferol  1,000 Units Oral Daily  . fluticasone  2 spray Each Nare Daily  . furosemide  40 mg Oral Daily  . guaiFENesin  1,200 mg Oral BID  . heparin  5,000 Units Subcutaneous Q8H  . ipratropium-albuterol  3 mL Nebulization TID  . lactulose  20 g Oral TID  . levofloxacin  750 mg Oral Q24H  . methylPREDNISolone (SOLU-MEDROL) injection  60 mg Intravenous Q12H  . montelukast  10 mg Oral QHS  . multivitamin with minerals  1 tablet Oral q morning - 10a  . nortriptyline  50 mg Oral QHS  . omega-3 acid ethyl esters  1,000 mg Oral q morning - 10a  . pantoprazole  40 mg Oral Daily  . potassium chloride SA  20 mEq Oral Daily   Continuous Infusions:     LOS: 3 days    Time spent: 79 mins    THOMPSON,DANIEL, MD [facility] Pager [phone]  If 7PM-7AM, please contact night-coverage [email or web address] Password [facility] 02/15/2016, 4:44 PM

## 2016-02-16 ENCOUNTER — Other Ambulatory Visit: Payer: Self-pay | Admitting: Oncology

## 2016-02-16 ENCOUNTER — Telehealth: Payer: Self-pay | Admitting: *Deleted

## 2016-02-16 LAB — BASIC METABOLIC PANEL
Anion gap: 3 — ABNORMAL LOW (ref 5–15)
BUN: 19 mg/dL (ref 6–20)
CO2: 37 mmol/L — ABNORMAL HIGH (ref 22–32)
Calcium: 8.8 mg/dL — ABNORMAL LOW (ref 8.9–10.3)
Chloride: 98 mmol/L — ABNORMAL LOW (ref 101–111)
Creatinine, Ser: 0.86 mg/dL (ref 0.44–1.00)
GFR calc Af Amer: >60 mL/min (ref >60)
GFR calc non Af Amer: >60 mL/min (ref >60)
Glucose, Bld: 93 mg/dL (ref 65–99)
Potassium: 3.6 mmol/L (ref 3.5–5.1)
Sodium: 138 mmol/L (ref 135–145)

## 2016-02-16 MED ORDER — LEVOFLOXACIN 750 MG PO TABS: 750.0000 mg | ORAL_TABLET | ORAL | Status: AC

## 2016-02-16 MED ORDER — PREDNISONE 20 MG PO TABS: 20.0000 mg | ORAL_TABLET | Freq: Every day | ORAL | Status: DC

## 2016-02-16 MED ORDER — FLUTICASONE PROPIONATE 50 MCG/ACT NA SUSP: 2.0000 | Freq: Every day | NASAL | Status: DC

## 2016-02-16 MED ORDER — GUAIFENESIN ER 600 MG PO TB12: 1200.0000 mg | ORAL_TABLET | Freq: Two times a day (BID) | ORAL | Status: DC

## 2016-02-16 NOTE — Care Management Important Message (Signed)
Important Message  Patient Details  Name: [name] MRN: [id number] Date of Birth: [birth year]   Medicare Important Message Given:  Yes    Camillo Flaming 02/16/2016, 10:38 AMImportant Message  Patient Details  Name: [name] MRN: [id number] Date of Birth: Apr 23, 1946   Medicare Important Message Given:  Yes    Camillo Flaming 02/16/2016, 10:38 AM

## 2016-02-16 NOTE — Discharge Summary (Signed)
Physician Discharge Summary  [name] [id number] DOB: 09/30/1945 DOA: 02/12/2016  PCP: Scarlette Calico, MD  Admit date: 02/12/2016 Discharge date: 02/16/2016  Time spent: 65 minutes  Recommendations for Outpatient Follow-up:  1. Follow-up with Scarlette Calico, MD in 1-2 weeks. On follow-up patient will need a basic metabolic profile done to follow-up on electrolytes and renal function. Patient's COPD and pneumonia will need to be reassessed.   Discharge Diagnoses:  Principal Problem:   Lobar pneumonia due to unspecified organism Active Problems:   COPD (chronic obstructive pulmonary disease) (HCC)   Hyperlipidemia with target LDL less than 70   Tobacco abuse   Chronic diastolic CHF (congestive heart failure) (HCC)   Chronic respiratory failure (HCC)   HTN (hypertension)   Gastroesophageal reflux disease without esophagitis   CAP (community acquired pneumonia)   COPD exacerbation ([place])   Discharge Condition: Stable and improved  Diet recommendation: Heart healthy  Filed Weights   02/14/16 0450 02/15/16 0602 02/16/16 0500  Weight: 57.834 kg (127 lb 8 oz) 58.015 kg (127 lb 14.4 oz) 58.16 kg (128 lb 3.5 oz)    History of present illness:  Per Dr Ciro Backer is a 70 y.o. female with PMH significant for HTN, diastolic heart failure, COPD, chronic resp failure, tobacco abuse, depression/anxiety and dyslipidemia: who presented to ED secondary to productive cough, chills and worsening SOB/wheezing. Patient symptoms had been present for the last 3-4 days and worsening. Patient denied CP, orthopnea, LE swelling, nausea, vomiting, dysuria, HA's, focal motor deficit, melena, hematochezia and any other complaints. Patient endorsed that nebulizer treatments at home, along with her inhaler therapy had not improved her symptoms.  ED Course: CXR with bi-lobar PNA findings and physical exam suggesting acute COPD exacerbation. Started on antibiotics, solumedrol given and received  also nebulizer treatment. [facility] called to admit patient for further evaluation and treatment of CAP and COPD exacerbation.  Hospital Course:  #1 lobar pneumonia/CAP Patient was admitted with worsening shortness of breath wheezing productive cough and chills. Chest x-ray done on admission was concerning for bi lobar pneumonia/CAP. Patient was admitted placed on oxygen, nebulizer treatments, IV Levaquin, scheduled nebulizers. Urine strep pneumococcus antigen negative. Urine Legionella antigen pending. Sputum Gram stain and cultures were negative. Blood cultures with no growth to date. She remained afebrile. Patient improved clinically was subsequently transitioned to oral Levaquin and will be discharged home on 4 more days of oral Levaquin to complete a course of antibiotic treatment. Outpatient follow-up.   #2 acute COPD exacerbation Likely triggered by problem #1. Patient was admitted and placed on oxygen, scheduled nebulizers, Mucinex, Pulmicort, Singulair, Flonase and Brovana, PPI, IV steroids and IV Levaquin. Patient improved clinically and was subsequently transitioned to oral Levaquin. Patient's IV steroids also tapered down and patient transitioned to oral prednisone. Patient wheezing improved and patient be discharged home on 4 more days of oral Levaquin to complete a course of antibiotic treatment as well as a prednisone taper.   #3 tobacco abuse Tobacco cessation.  #4 hypertension Blood pressure remained stable.  #5 gastroesophageal reflux disease Was maintained on a PPI.  #6 constipation Patient on hospital day one was complaining about constipation. Patient was maintained on a home regimen of lactulose with resolution of constipation.   #7 hypophosphatemia Repleted.  #8 chronic diastolic heart failure Stable. Patient remained euvolemic during the hospitalization. Patient was maintained on home regimen of Lasix, aspirin.  Procedures:  Chest x-ray  02/12/2016  Consultations:  None  Discharge Exam: Filed Vitals:  02/15/16 2121 02/16/16 0529  BP: 140/69 108/59  Pulse: 88 76  Temp: 98.3 F (36.8 C) 98 F (36.7 C)  Resp: 20 20    General: NAD Cardiovascular: RRR Respiratory: CTAB  Discharge Instructions   Discharge Instructions    Diet - low sodium heart healthy    Complete by:  As directed      Discharge instructions    Complete by:  As directed   Follow up with Scarlette Calico, MD in 1-2 weeks.     Increase activity slowly    Complete by:  As directed           Current Discharge Medication List    START taking these medications   Details  fluticasone (FLONASE) 50 MCG/ACT nasal spray Place 2 sprays into both nostrils daily. Take for 5 days then stop. Qty: 16 g, Refills: 0    guaiFENesin (MUCINEX) 600 MG 12 hr tablet Take 2 tablets (1,200 mg total) by mouth 2 (two) times daily. Take for 4 days then stop. Qty: 16 tablet, Refills: 0    levofloxacin (LEVAQUIN) 750 MG tablet Take 1 tablet (750 mg total) by mouth daily. Take for 4 days then stop. Qty: 4 tablet, Refills: 0    predniSONE (DELTASONE) 20 MG tablet Take 1-3 tablets (20-60 mg total) by mouth daily before breakfast. Take 3 tablets (60mg ) x 2 days, then 2 tablets (40mg ) x 3 days then 1 tablet (20mg ) x 3 days then stop. Qty: 15 tablet, Refills: 0      CONTINUE these medications which have NOT CHANGED   Details  albuterol (VENTOLIN HFA) 108 (90 Base) MCG/ACT inhaler Inhale 2 puffs into the lungs every 4 (four) hours as needed. Qty: 108 g, Refills: 1    ALPRAZolam (XANAX) 1 MG tablet TAKE 1 TABLET THREE TIMES A DAY AS NEEDED FOR SLEEP OR ANXIETY Qty: 60 tablet, Refills: 5   Associated Diagnoses: GAD (generalized anxiety disorder)    Artificial Tear Ointment (DRY EYES OP) Apply 2 drops to eye daily as needed.    aspirin 325 MG EC tablet Take 1 tablet (325 mg total) by mouth every morning.    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     cholecalciferol (VITAMIN D) 1000 units tablet Take 1,000 Units by mouth daily.    COMBIVENT RESPIMAT 20-100 MCG/ACT AERS respimat INHALE 1 PUFF INTO THE LUNGS EVERY 6 (SIX) HOURS. Qty: 4 g, Refills: 5    diclofenac sodium (VOLTAREN) 1 % GEL APPLY 4 G TOPICALLY DAILY AS NEEDED (APPLY TO HANDS). HAND PAIN Refills: 2    furosemide (LASIX) 40 MG tablet TAKE 1 TABLET (40 MG TOTAL) BY MOUTH 2 (TWO) TIMES DAILY. Qty: 180 tablet, Refills: 1    ipratropium-albuterol (DUONEB) 0.5-2.5 (3) MG/3ML SOLN PUT 1 VIAL INTO NEBILZER 2 TIMES A DAY AS NEEDED DX J44.9 Qty: 180 mL, Refills: 2    lactulose (CHRONULAC) 10 GM/15ML solution Take 30 mLs po tid prn constipation Qty: 1892 mL, Refills: 1    LIVALO 2 MG TABS Take 1 tablet by mouth every evening.  Refills: 3    montelukast (SINGULAIR) 10 MG tablet Take 1 tablet (10  mg total) by mouth at bedtime. Qty: 30 tablet, Refills: 11    Multiple Vitamins-Minerals (MULTIVITAMIN WITH MINERALS) tablet Take 1 tablet by mouth every morning.     nortriptyline (PAMELOR) 25 MG capsule Take 2 capsules (50 mg total) by mouth at bedtime. Qty: 60 capsule, Refills: 11    Omega 3 1200 MG CAPS Take 1,200 mg by mouth every morning.     ondansetron (ZOFRAN) 8 MG tablet TAKE 1 TABLET BY MOUTH TWO TIMES A DAY AS NEEDED FOR NAUSEA OR VOMITING Qty: 30 tablet, Refills: 3    pantoprazole (PROTONIX) 40 MG tablet TAKE 1 TABLET BY MOUTH ONCE A DAY Qty: 90 tablet, Refills: 3    potassium chloride SA (KLOR-CON M20) 20 MEQ tablet Take 1 tablet (20 mEq total) by mouth daily. Qty: 90 tablet, Refills: 1    SYMBICORT 160-4.5 MCG/ACT inhaler Inhale  2 puffs into the lungs 2 (two) times daily. Qty: 1 Inhaler, Refills: 11   Associated Diagnoses: Other emphysema ([place]); Chronic bronchitis, unspecified chronic bronchitis type ([place])    OXYGEN-HELIUM IN Inhale 3 L into the lungs See admin instructions. Uses when needed during the day, uses continuous throughout the night       Allergies  Allergen Reactions  . Ceftriaxone Anaphylaxis and Other (See Comments)    *ROCEPHIN*  "Blow up like a balloon"  . Hydroxyzine Shortness Of Breath and Other (See Comments)    Pt states med make her light headed, get sob sxs  . Lexapro [Escitalopram] Other (See Comments)    Pt states med make her dizzy   Follow-up Information    Follow up with Scarlette Calico, MD. Schedule an appointment as soon as possible for a visit in 2 weeks.   Specialty:  Internal Medicine   Why:  f/u in 1-2 weeks.   Contact information:   [address] [place] [address] [phone]        The results of significant diagnostics from this hospitalization (including imaging, microbiology, ancillary and laboratory) are listed below for reference.    Significant Diagnostic Studies: Dg Chest 2 View  02/12/2016  CLINICAL DATA:  Cough and shortness of breath for 4 days EXAM: CHEST  2 VIEW COMPARISON:  September 03, 2015. FINDINGS: There is extensive airspace consolidation throughout the lingula consistent with pneumonia. There is also some patchy infiltrate in the anterior segment left upper lobe. The right lung is clear. Heart size and pulmonary vascular normal. No adenopathy. There are surgical clips the right axillary region. There is mild degenerative change in the thoracic spine. IMPRESSION: Extensive airspace consolidation consistent with pneumonia throughout much of the lingula as well as a portion of the anterior segment left upper lobe. Right lung clear. Cardiac silhouette within normal limits. Followup PA and lateral chest radiographs recommended in 3-4 weeks  following trial of antibiotic therapy to ensure resolution and exclude underlying malignancy. Electronically Signed   By: Lowella Grip III M.D.   On: 02/12/2016 09:54    Microbiology: Recent Results (from the past 240 hour(s))  Culture, blood (routine x 2) Call MD if unable to obtain prior to antibiotics being given     Status: None (Preliminary result)   Collection Time: 02/12/16  6:34 PM  Result Value Ref Range Status   Specimen Description BLOOD LEFT ARM  Final   Special Requests BOTTLES DRAWN AEROBIC AND ANAEROBIC 5CC  Final   Culture   Final    NO GROWTH 3 DAYS Performed at [facility]  Report Status PENDING  Incomplete  Culture, blood (routine x 2) Call MD if unable to obtain prior to antibiotics being given     Status: None (Preliminary result)   Collection Time: 02/12/16  6:58 PM  Result Value Ref Range Status   Specimen Description BLOOD LEFT HAND  Final   Special Requests IN PEDIATRIC BOTTLE 3CC  Final   Culture   Final    NO GROWTH 3 DAYS Performed at [facility]    Report Status PENDING  Incomplete  Culture, sputum-assessment     Status: None   Collection Time: 02/14/16  2:00 PM  Result Value Ref Range Status   Specimen Description SPUTUM  Final   Special Requests NONE  Final   Sputum evaluation   Final    THIS SPECIMEN IS ACCEPTABLE. RESPIRATORY CULTURE REPORT TO FOLLOW.   Report Status 02/14/2016 FINAL  Final     Labs: Basic Metabolic Panel:  Recent Labs Lab 02/12/16 1016 02/12/16 1858 02/13/16 0523 02/14/16 0541 02/15/16 0556 02/16/16 0526  NA 135  --  137 140 139 138  K 4.0  --  4.5 3.9 4.7 3.6  CL 100*  --  102 105 102 98*  CO2 27  --  28 29 32 37*  GLUCOSE 113*  --  131* 117* 131* 93  BUN 17  --  15 14 18 19   CREATININE 1.26*  --  0.89 0.83 0.92 0.86  CALCIUM 8.5*  --  8.3* 8.1* 8.7* 8.8*  MG  --  1.8  --   --   --   --   PHOS  --  1.9*  --  2.4* 2.3*  --    Liver Function Tests:  Recent Labs Lab 02/12/16 1016  AST  31  ALT 29  ALKPHOS 61  BILITOT 0.2*  PROT 7.1  ALBUMIN 3.2*   No results for input(s): LIPASE, AMYLASE in the last 168 hours. No results for input(s): AMMONIA in the last 168 hours. CBC:  Recent Labs Lab 02/12/16 1016 02/13/16 0523 02/14/16 0541 02/15/16 0556  WBC 8.7 5.8 7.3 3.4*  HGB 11.9* 12.1 11.2* 11.6*  HCT 35.2* 36.4 34.0* 35.9*  MCV 93.1 92.9 95.8 97.3  PLT 175 191 215 198   Cardiac Enzymes:  Recent Labs Lab 02/12/16 1016  TROPONINI <0.03   BNP: BNP (last 3 results)  Recent Labs  09/03/15 2333 02/12/16 1016  BNP 253.4* 63.7    ProBNP (last 3 results) No results for input(s): PROBNP in the last 8760 hours.  CBG: No results for input(s): GLUCAP in the last 168 hours.     SignedIrine Seal MD.  [facility] 02/16/2016, 9:40 AM

## 2016-02-16 NOTE — Telephone Encounter (Signed)
Called pt to set up TCM appt no answer LMOM RTC.../lmb 

## 2016-02-17 LAB — CULTURE, RESPIRATORY W GRAM STAIN: Culture: NORMAL

## 2016-02-17 LAB — CULTURE, BLOOD (ROUTINE X 2)
Culture: NO GROWTH
Culture: NO GROWTH

## 2016-02-17 NOTE — Telephone Encounter (Signed)
Called pt completed TCM call below.../lmb  Transition Care Management Follow-up Telephone Call   Date discharged? 02/16/16   How have you been since you were released from the hospital? Pt states she is doing fairly well   Do you understand why you were in the hospital? YES   Do you understand the discharge instructions? YES   Where were you discharged to? Home   Items Reviewed:  Medications reviewed: YES  Allergies reviewed: YES  Dietary changes reviewed: YES, heart healthy  Referrals reviewed: No referral needed   Functional Questionnaire:   Activities of Daily Living (ADLs):   She states she are independent in the following: ambulation, bathing and hygiene, feeding, continence, grooming, toileting and dressing States she require assistance with the following: ambulation sometimes   Any transportation issues/concerns?: NO   Any patient concerns? NO   Confirmed importance and date/time of follow-up visits scheduled YES, appt 03/03/16  Provider Appointment booked with Dr. Ronnald Ramp   Confirmed with patient if condition begins to worsen call PCP or go to the ER.  Patient was given the office number and encouraged to call back with question or concerns.  : YES

## 2016-02-18 ENCOUNTER — Other Ambulatory Visit: Payer: Self-pay | Admitting: Internal Medicine

## 2016-02-18 NOTE — Telephone Encounter (Signed)
Ok to fill 

## 2016-02-19 ENCOUNTER — Other Ambulatory Visit: Payer: Self-pay | Admitting: Internal Medicine

## 2016-02-20 NOTE — Telephone Encounter (Signed)
Pt lm on triage. Pharmacy did not receive fax. Phoned in.   Pt informed

## 2016-02-26 ENCOUNTER — Ambulatory Visit: Payer: Medicare Other | Admitting: Internal Medicine

## 2016-03-03 ENCOUNTER — Ambulatory Visit (INDEPENDENT_AMBULATORY_CARE_PROVIDER_SITE_OTHER): Payer: Medicare Other | Admitting: Internal Medicine

## 2016-03-03 ENCOUNTER — Ambulatory Visit (INDEPENDENT_AMBULATORY_CARE_PROVIDER_SITE_OTHER)
Admission: RE | Admit: 2016-03-03 | Discharge: 2016-03-03 | Disposition: A | Discharge status: Home / Self Care | Payer: Medicare Other | Source: Ambulatory Visit | Attending: Internal Medicine | Admitting: Internal Medicine

## 2016-03-03 ENCOUNTER — Encounter: Payer: Self-pay | Admitting: Internal Medicine

## 2016-03-03 VITALS — BP 110/70 | HR 89 | Temp 98.1°F | Resp 16 | Ht 62.0 in | Wt 124.0 lb

## 2016-03-03 DIAGNOSIS — J181 Lobar pneumonia, unspecified organism: Secondary | ICD-10-CM

## 2016-03-03 DIAGNOSIS — Z09 Encounter for follow-up examination after completed treatment for conditions other than malignant neoplasm: Secondary | ICD-10-CM | POA: Diagnosis not present

## 2016-03-03 DIAGNOSIS — J189 Pneumonia, unspecified organism: Secondary | ICD-10-CM

## 2016-03-03 DIAGNOSIS — J449 Chronic obstructive pulmonary disease, unspecified: Secondary | ICD-10-CM | POA: Diagnosis not present

## 2016-03-03 NOTE — Progress Notes (Signed)
Pre visit review using our clinic review tool, if applicable. No additional management support is needed unless otherwise documented below in the visit note. 

## 2016-03-03 NOTE — Progress Notes (Signed)
Subjective:  Patient ID: [name], female    DOB: 08-27-1946  Age: 70 y.o. MRN: [id number]  CC: Cough   HPI [name] presents for a hosp f/up after a recent episode of PNA that required inpt IV antibiotics. Her cough is dramatically improved and is now productive of thin white phlegm. She complains of fatigue and has a baseline level of wheezing and shortness of breath. She denies chest pain, hemoptysis, night sweats, fever, chills, or edema.  Recommendations for Outpatient Follow-up:  1. Follow-up with Scarlette Calico, MD in 1-2 weeks. On follow-up patient will need a basic metabolic profile done to follow-up on electrolytes and renal function. Patient's COPD and pneumonia will need to be reassessed.   Discharge Diagnoses:  Principal Problem:  Lobar pneumonia due to unspecified organism Active Problems:  COPD (chronic obstructive pulmonary disease) (HCC)  Hyperlipidemia with target LDL less than 70  Tobacco abuse  Chronic diastolic CHF (congestive heart failure) (HCC)  Chronic respiratory failure (HCC)  HTN (hypertension)  Gastroesophageal reflux disease without esophagitis  CAP (community acquired pneumonia)  COPD exacerbation ([place])   Outpatient Prescriptions Prior to Visit  Medication Sig Dispense Refill  . albuterol (VENTOLIN HFA) 108 (90 Base) MCG/ACT inhaler Inhale 2 puffs into the lungs every 4 (four) hours as needed. 108 g 1  . ALPRAZolam (XANAX) 1 MG tablet TAKE 1 TABLET BY MOUTH TWICE A DAY AS NEEDED FOR SLEEP/ANXIETY 60 tablet 4  . Artificial Tear Ointment (DRY EYES OP) Apply 2 drops to eye daily as needed.    Marland Kitchen aspirin 325 MG EC tablet Take 1 tablet (325 mg total) by mouth every morning.    . ATROVENT HFA 17 MCG/ACT inhaler USE 2 PUFFS EVERY FOUR HOURS AS NEEDED 77.4 Inhaler 1  . butalbital-acetaminophen-caffeine (FIORICET) 50-325-40 MG tablet Take 1-2 pills as needed for headache (Patient taking differently: Take 1 tablet by mouth 2 (two) times  daily as needed for headache or migraine. Take 1-2 pills as needed for headache) 60 tablet 5  . calcium-vitamin D (OSCAL WITH D) 250-125 MG-UNIT per tablet Take 1 tablet by mouth every morning.     . cholecalciferol (VITAMIN D) 1000 units tablet Take 1,000 Units by mouth daily.    . COMBIVENT RESPIMAT 20-100 MCG/ACT AERS respimat INHALE 1 PUFF INTO THE LUNGS EVERY 6 (SIX) HOURS. (Patient taking differently: INHALE 1 PUFF INTO THE LUNGS EVERY 8 HOURS AS NEEDED.) 4 g 5  . diclofenac sodium (VOLTAREN) 1 % GEL APPLY 4 G TOPICALLY DAILY AS NEEDED (APPLY TO HANDS). HAND PAIN  2  . furosemide (LASIX) 40 MG tablet TAKE 1 TABLET (40 MG TOTAL) BY MOUTH 2 (TWO) TIMES DAILY. 180 tablet 1  . ipratropium-albuterol (DUONEB) 0.5-2.5 (3) MG/3ML SOLN PUT 1 VIAL INTO NEBILZER 2 TIMES A DAY AS NEEDED DX J44.9 180 mL 2  . lactulose (CHRONULAC) 10 GM/15ML solution TAKE 30 MLS BY MOUTH 3 TIMES A DAY AS NEEDED FOR CONSTIPATION 1892 mL 1  . LIVALO 2 MG TABS Take 1 tablet by mouth every evening.   3  . montelukast (SINGULAIR) 10 MG tablet Take 1 tablet (10 mg total) by mouth at bedtime. 30 tablet 11  . Multiple Vitamins-Minerals (MULTIVITAMIN WITH MINERALS) tablet Take 1 tablet by mouth every morning.     . nortriptyline (PAMELOR) 25 MG capsule Take 2 capsules (50 mg total) by mouth at bedtime. 60 capsule 11  . Omega 3 1200 MG CAPS Take 1,200 mg by mouth every morning.     Marland Kitchen  ondansetron (ZOFRAN) 8 MG tablet TAKE 1 TABLET BY MOUTH TWO TIMES A DAY AS NEEDED FOR NAUSEA OR VOMITING (Patient taking differently: TAKE 1 TABLET BY MOUTH ONCE A DAY AS NEEDED FOR NAUSEA OR VOMITING) 30 tablet 3  . OXYGEN-HELIUM IN Inhale 3 L into the lungs See admin instructions. Uses when needed during the day, uses continuous throughout the night    . pantoprazole (PROTONIX) 40 MG tablet TAKE 1 TABLET BY MOUTH ONCE A DAY 90 tablet 3  . potassium chloride SA (KLOR-CON M20) 20 MEQ tablet Take 1 tablet (20 mEq total) by mouth daily. 90 tablet 1  .  SYMBICORT 160-4.5 MCG/ACT inhaler Inhale 2 puffs into the lungs 2 (two) times daily. (Patient taking differently: Inhale 1 puff into the lungs 2 (two) times daily. ) 1 Inhaler 11  . fluticasone (FLONASE) 50 MCG/ACT nasal spray Place 2 sprays into both nostrils daily. Take for 5 days then stop. (Patient not taking: Reported on 03/03/2016) 16 g 0  . guaiFENesin (MUCINEX) 600 MG 12 hr tablet Take 2 tablets (1,200 mg total) by mouth 2 (two) times daily. Take for 4 days then stop. 16 tablet 0  . predniSONE (DELTASONE) 20 MG tablet Take 1-3 tablets (20-60 mg total) by mouth daily before breakfast. Take 3 tablets (60mg ) x 2 days, then 2 tablets (40mg ) x 3 days then 1 tablet (20mg ) x 3 days then stop. 15 tablet 0   No facility-administered medications prior to visit.    ROS Review of Systems  Constitutional: Positive for fatigue. Negative for fever, chills, diaphoresis, appetite change and unexpected weight change.  HENT: Negative.  Negative for congestion, facial swelling, sinus pressure, sore throat and trouble swallowing.   Eyes: Negative.   Respiratory: Positive for cough, shortness of breath and wheezing. Negative for apnea, choking, chest tightness and stridor.   Cardiovascular: Negative.  Negative for chest pain, palpitations and leg swelling.  Gastrointestinal: Negative.  Negative for nausea, vomiting, abdominal pain, diarrhea and constipation.  Endocrine: Negative.   Genitourinary: Negative.  Negative for difficulty urinating.  Musculoskeletal: Negative.  Negative for myalgias, back pain, joint swelling, arthralgias and neck pain.  Skin: Negative.  Negative for color change and rash.  Allergic/Immunologic: Negative.   Neurological: Negative.   Hematological: Negative.  Negative for adenopathy. Does not bruise/bleed easily.  Psychiatric/Behavioral: Negative.     Objective:  BP 110/70 mmHg  Pulse 89  Temp(Src) 98.1 F (36.7 C) (Oral)  Resp 16  Ht 5\' 2"  (1.575 m)  Wt 124 lb (56.246 kg)   BMI 22.67 kg/m2  SpO2 98%  BP Readings from Last 3 Encounters:  03/03/16 110/70  02/16/16 108/59  11/14/15 100/62    Wt Readings from Last 3 Encounters:  03/03/16 124 lb (56.246 kg)  02/16/16 128 lb 3.5 oz (58.16 kg)  11/14/15 124 lb 9.6 oz (56.518 kg)    Physical Exam  Constitutional: She is oriented to person, place, and time. No distress.  HENT:  Mouth/Throat: Oropharynx is clear and moist. No oropharyngeal exudate.  Eyes: Conjunctivae are normal. Right eye exhibits no discharge. Left eye exhibits no discharge. No scleral icterus.  Neck: Normal range of motion. Neck supple. No JVD present. No tracheal deviation present. No thyromegaly present.  Cardiovascular: Normal rate, regular rhythm, normal heart sounds and intact distal pulses.  Exam reveals no gallop and no friction rub.   No murmur heard. Pulmonary/Chest: Effort normal. No stridor. No respiratory distress. She has no decreased breath sounds. She has wheezes in the right  middle field and the left middle field. She has rhonchi in the right lower field and the left lower field. She has no rales. She exhibits no tenderness.  There is mild rhonchi and wheezing in the mid zone but she has good air movement  Abdominal: Soft. Bowel sounds are normal. She exhibits no distension and no mass. There is no tenderness. There is no rebound and no guarding.  Musculoskeletal: Normal range of motion. She exhibits no edema or tenderness.  Lymphadenopathy:    She has no cervical adenopathy.  Neurological: She is oriented to person, place, and time.  Skin: Skin is warm and dry. No rash noted. She is not diaphoretic. No erythema. No pallor.  Vitals reviewed.   Lab Results  Component Value Date   WBC 3.4* 02/15/2016   HGB 11.6* 02/15/2016   HCT 35.9* 02/15/2016   PLT 198 02/15/2016   GLUCOSE 93 02/16/2016   CHOL 197 10/16/2015   TRIG 100 10/16/2015   HDL 78 10/16/2015   LDLCALC 99 10/16/2015   ALT 29 02/12/2016   AST 31 02/12/2016     NA 138 02/16/2016   K 3.6 02/16/2016   CL 98* 02/16/2016   CREATININE 0.86 02/16/2016   BUN 19 02/16/2016   CO2 37* 02/16/2016   TSH 0.309* 02/12/2016   INR 0.87 09/05/2013   HGBA1C 5.3 10/16/2015    Dg Chest 2 View  02/12/2016  CLINICAL DATA:  Cough and shortness of breath for 4 days EXAM: CHEST  2 VIEW COMPARISON:  September 03, 2015. FINDINGS: There is extensive airspace consolidation throughout the lingula consistent with pneumonia. There is also some patchy infiltrate in the anterior segment left upper lobe. The right lung is clear. Heart size and pulmonary vascular normal. No adenopathy. There are surgical clips the right axillary region. There is mild degenerative change in the thoracic spine. IMPRESSION: Extensive airspace consolidation consistent with pneumonia throughout much of the lingula as well as a portion of the anterior segment left upper lobe. Right lung clear. Cardiac silhouette within normal limits. Followup PA and lateral chest radiographs recommended in 3-4 weeks following trial of antibiotic therapy to ensure resolution and exclude underlying malignancy. Electronically Signed   By: Lowella Grip III M.D.   On: 02/12/2016 09:54   Dg Chest 2 View  03/03/2016  CLINICAL DATA:  Followup of pneumonia, former smoking history EXAM: CHEST  2 VIEW COMPARISON:  Chest x-ray of 02/12/2016 FINDINGS: The left upper lobe and lingular pneumonia has largely cleared with minimally prominent markings remaining anteriorly on the lateral view in the region of the upper lobe on the left. No pleural effusion is seen. Mediastinal and hilar contours are unremarkable and the heart is mildly enlarged and stable. No acute bony abnormality is seen. Surgical clips overlie the right axilla. IMPRESSION: Clearing of lingular pneumonia. Minimally prominent markings remain in the left upper lobe anteriorly possibly chronic in nature. Electronically Signed   By: Ivar Drape M.D.   On: 03/03/2016 15:33   Dg  Chest 2 View  02/12/2016  CLINICAL DATA:  Cough and shortness of breath for 4 days EXAM: CHEST  2 VIEW COMPARISON:  September 03, 2015. FINDINGS: There is extensive airspace consolidation throughout the lingula consistent with pneumonia. There is also some patchy infiltrate in the anterior segment left upper lobe. The right lung is clear. Heart size and pulmonary vascular normal. No adenopathy. There are surgical clips the right axillary region. There is mild degenerative change in the thoracic spine. IMPRESSION: Extensive  airspace consolidation consistent with pneumonia throughout much of the lingula as well as a portion of the anterior segment left upper lobe. Right lung clear. Cardiac silhouette within normal limits. Followup PA and lateral chest radiographs recommended in 3-4 weeks following trial of antibiotic therapy to ensure resolution and exclude underlying malignancy. Electronically Signed   By: Lowella Grip III M.D.   On: 02/12/2016 09:54    Assessment & Plan:   Sinae was seen today for cough.  Diagnoses and all orders for this visit:  Lobar pneumonia due to unspecified organism- chest x-ray shows that this has cleared after antibiotic therapy -     DG Chest 2 View; Future  CAP (community acquired pneumonia)- this has resolved -     DG Chest 2 View; Future  Chronic obstructive pulmonary disease, unspecified COPD type ([place])- her symptoms and physical exam at her baseline, she is not in the midst of an exacerbation anymore, she will continue the current inhaler regimen of Symbicort, ipratropium, and albuterol.   I have discontinued Ms. Aschoff's guaiFENesin and predniSONE. I am also having her maintain her multivitamin with minerals, calcium-vitamin D, OXYGEN-HELIUM IN, Omega 3, nortriptyline, COMBIVENT RESPIMAT, montelukast, aspirin, pantoprazole, diclofenac sodium, LIVALO, albuterol, SYMBICORT, furosemide, ondansetron, ipratropium-albuterol, ATROVENT HFA, potassium chloride SA,  butalbital-acetaminophen-caffeine, cholecalciferol, Artificial Tear Ointment (DRY EYES OP), fluticasone, ALPRAZolam, lactulose, and vitamin C.  Meds ordered this encounter  Medications  . vitamin C (ASCORBIC ACID) 500 MG tablet    Sig: Take 500 mg by mouth daily.     Follow-up: Return if symptoms worsen or fail to improve.  Scarlette Calico, MD

## 2016-03-03 NOTE — Patient Instructions (Signed)

## 2016-03-17 ENCOUNTER — Other Ambulatory Visit: Payer: Self-pay | Admitting: Internal Medicine

## 2016-03-21 ENCOUNTER — Other Ambulatory Visit: Payer: Self-pay | Admitting: Pulmonary Disease

## 2016-03-24 NOTE — Telephone Encounter (Signed)
[facility] calling about refill on combivent rescumat. [phone]

## 2016-03-25 ENCOUNTER — Other Ambulatory Visit: Payer: Self-pay | Admitting: Pulmonary Disease

## 2016-03-31 ENCOUNTER — Encounter: Payer: Self-pay | Admitting: Internal Medicine

## 2016-03-31 ENCOUNTER — Ambulatory Visit (INDEPENDENT_AMBULATORY_CARE_PROVIDER_SITE_OTHER): Payer: Medicare Other | Admitting: Internal Medicine

## 2016-03-31 VITALS — BP 108/74 | HR 90 | Ht 62.0 in | Wt 123.8 lb

## 2016-03-31 DIAGNOSIS — I2510 Atherosclerotic heart disease of native coronary artery without angina pectoris: Secondary | ICD-10-CM | POA: Diagnosis not present

## 2016-03-31 DIAGNOSIS — E782 Mixed hyperlipidemia: Secondary | ICD-10-CM | POA: Diagnosis not present

## 2016-03-31 DIAGNOSIS — I10 Essential (primary) hypertension: Secondary | ICD-10-CM

## 2016-03-31 DIAGNOSIS — I350 Nonrheumatic aortic (valve) stenosis: Secondary | ICD-10-CM | POA: Diagnosis not present

## 2016-03-31 DIAGNOSIS — I5032 Chronic diastolic (congestive) heart failure: Secondary | ICD-10-CM

## 2016-03-31 DIAGNOSIS — J438 Other emphysema: Secondary | ICD-10-CM | POA: Diagnosis not present

## 2016-03-31 NOTE — Progress Notes (Signed)
OFFICE NOTE  Chief Complaint:  No complaints  Primary Care Physician: Scarlette Calico, MD  HPI:  [name] a pleasant 70 year old female with a history of COPD and severe pulmonary hypertension. Recently she was put on home oxygen and has been on an oxygen concentrator, which has made a marked improvement in her ability to get around. She has previously seen Dr. Lamonte Sakai with pulmonology; however, that did not go too well and she has basically given up on any further treatments for COPD other than her oxygen. She still uses nebulizers as needed when she is tight, and I recommended Claritin for seasonal allergies today. As you know, she also has mild aortic stenosis and we are continuing to follow that. She is a low-risk stress test in November 2012. Unfortunately she was recently admitted for altered mental status, possible Tylenol overdose, respiratory failure and liver failure. All of which seems to have resolved. She seems to be doing pre-well after discharge.  She returns today for followup. She reports doing fairly well. She has seen Dr. Lake Bells in the interim and he is recommended continue current therapies. She is now seeing a different internist, Dr. Ronnald Ramp. He was recently admitted the hospital for pneumonia and treated accordingly. She had troponins which were negative suggesting they're likely is no significant underlying obstructive coronary disease. She reports she's recovered and is back to her base. She is overdue for repeat lipid profile.  I saw Mrs. [name] back in the office today. Overall she reports she is doing fairly well. She's not been admitted to the hospital recently. She says she has stable shortness of breath. She denies any chest pain. It should be noted that she does have mild aortic stenosis which will last assessed in 2014.  Mrs. [name] returns today for follow-up. She reports that she is doing fairly well. Fortune she's not been hospitalized. She has had an  episode of COPD exacerbation which was supported with early antibiotics. She denies any worsening chest pain and has pretty stable shortness of breath on oxygen. She is able to ambulate and do most of her activities without any restrictions.  03/31/2016  [name] was seen back today in the office for follow-up. She seems to be doing fairly well although does use her oxygen almost continuously. Saturation was low today although she had heavy fingernail polish on and therefore the reading may not be accurate as it read 88%. She was on oxygen via a concentrator. Blood pressure was stable at 108/74. Weight is been stable. She recently had a repeat echocardiogram which shows stable mild aortic stenosis and normal LV function with some mild improvement in pulmonary pressures in May. She denies any chest pain. Fortune that she's not been admitted to the hospital recently for any pneumonia but did have problems with recent gallbladder disease and had cholecystectomy.  PMHx:  Past Medical History  Diagnosis Date  . Angina   . Heart murmur   . Depressed   . Coronary artery disease 2006    2 stents w/previous MI  . Hypertension   . Migraine   . Anxiety   . Myocardial infarction [facility]) 2006    NSTEMI  . COPD (chronic obstructive pulmonary disease) (HCC)     oxygen-dependent 4LPM [place]  . Moderate to severe pulmonary hypertension ([place])   . Diastolic CHF ([place])   . Cancer of breast ([place]) dx'd 2008    RT Breast  . Aortic stenosis     mild  . Dyslipidemia   .  NSVT (nonsustained ventricular tachycardia) (HCC)     h/o  . History of nuclear stress test 08/03/2011    attenuation at apex - no perfusion defects   . Abnormal LFTs 08/02/2011  . Acute liver failure 06/19/2013  . Acute renal failure ([place]) 06/19/2013  . Acute respiratory failure ([place]) 06/19/2013  . Altered mental status 08/01/2011  . Anxiety state, unspecified 12/03/2013  . Breast cancer of upper-outer quadrant of left female breast ([place]) 08/21/2013  .  CAD (coronary artery disease) of artery bypass graft 11/06/2013  . CAD (coronary artery disease), MI R/O 08/01/2011    PCI in 2006 (bare metal stent, unknown artery) - [place]   . CHF,  acute diastolic, BNP 4k on admissio 08/01/2011  . Chronic respiratory failure ([place]) 02/02/2012  . Compression fracture of L1 lumbar vertebra ([place]) 02/02/2012  . Mild aortic stenosis 08/01/2011    AVA 1.69 cm2 (06/02/2011)   . Hyponatremia 01/31/2012  . Hyperkalemia, on ACE prior to admission 11/11/2011  . HTN (hypertension) 11/06/2013  . History of breast cancer in female 06/23/2013  . 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 Breast lumpectomy Right 2008  . Cardiac catheterization  2006  . Cholecystectomy  2011  . Partial colectomy  2009    for obstruction: temporary ostomy, later reversed.   . Transthoracic echocardiogram  11/12/2011    EF 0000000, normal systolic function, grade 1 diastolic dysfunction; ventricular septal flattening (D-sign); mild AS; trace-mild MR; LA mildly dilated; RV mod dilated; RA mod dilated; severe pulm HTN; elevated CVP  . Right heart catheterization N/A 09/05/2013    Procedure: RIGHT HEART CATH;  Surgeon: Jolaine Artist, MD;  Location[facility] C[facility] LAB;  Service: Cardiovascular;  Laterality: N/A;  . Ercp N/A 09/05/2015    Procedure: ENDOSCOPIC RETROGRADE CHOLANGIOPANCREATOGRAPHY (ERCP);  Surgeon: Ladene Artist, MD;  Location: [facility] ENDOSCOPY;  Service: Endoscopy;  Laterality: N/A;    FAMHx:  Family History  Problem Relation Age of Onset  . Schizophrenia Sister   . Alzheimer's disease Mother   . Hyperlipidemia Brother   . Heart disease Sister   . Diabetes Sister   . Cancer Neg Hx   . Stroke Neg Hx   . COPD Neg Hx   . Depression Neg Hx   . Drug abuse Neg Hx   . Early death Neg Hx   . Hypertension Neg Hx   . Kidney disease Neg Hx     SOCHx:   reports that she has been smoking Cigarettes.  She has a 11.75 pack-year smoking history.  She has never used smokeless tobacco. She reports that she does not drink alcohol or use illicit drugs.  ALLERGIES:  Allergies  Allergen Reactions  . Ceftriaxone Anaphylaxis and Other (See Comments)    *ROCEPHIN*  "Blow up like a balloon"  . Hydroxyzine Shortness Of Breath and Other (See Comments)    Pt states med make her light headed, get sob sxs  . Lexapro [Escitalopram] Other (See Comments)    Pt states med make her dizzy    ROS: Pertinent items noted in HPI and remainder of comprehensive ROS otherwise negative.  HOME MEDS: Current Outpatient Prescriptions  Medication Sig Dispense Refill  . albuterol (VENTOLIN HFA) 108 (90 Base) MCG/ACT inhaler Inhale 2 puffs into the lungs every 4 (four) hours as needed. 108 g 1  . ALPRAZolam (XANAX) 1 MG tablet TAKE 1 TABLET BY MOUTH TWICE A DAY AS NEEDED FOR SLEEP/ANXIETY 60  tablet 4  . Artificial Tear Ointment (DRY EYES OP) Apply 2 drops to eye daily as needed.    Marland Kitchen aspirin 325 MG EC tablet Take 1 tablet (325 mg total) by mouth every morning.    . ATROVENT HFA 17 MCG/ACT inhaler USE 2 PUFFS EVERY FOUR HOURS AS NEEDED 77.4 Inhaler 1  . budesonide-formoterol (SYMBICORT) 160-4.5 MCG/ACT inhaler Inhale 2 puffs into the lungs as directed.    . butalbital-acetaminophen-caffeine (FIORICET WITH CODEINE) 50-325-40-30 MG capsule Take 1 capsule by mouth as directed.    . calcium-vitamin D (OSCAL WITH D) 250-125 MG-UNIT per tablet Take 1 tablet by mouth every morning.     . cholecalciferol (VITAMIN D) 1000 units tablet Take 1,000 Units by mouth daily.    . COMBIVENT RESPIMAT 20-100 MCG/ACT AERS respimat INHALE 1 PUFF INTO THE LUNGS EVERY 6 (SIX) HOURS. 4 g 5  . COMBIVENT RESPIMAT 20-100 MCG/ACT AERS respimat INHALE 1 PUFF INTO THE LUNGS EVERY 6 (SIX) HOURS. 1 Inhaler 0  . diclofenac sodium (VOLTAREN) 1 % GEL APPLY 4 G TOPICALLY DAILY AS NEEDED (APPLY TO HANDS). HAND PAIN  2  . fluticasone (FLONASE) 50 MCG/ACT nasal spray INSTILL 2 SPRAYS INTO BOTH  NOSTRILS DAILY. REPEAT FOR 5 DAYS THEN STOP 16 g 0  . furosemide (LASIX) 40 MG tablet TAKE 1 TABLET (40 MG TOTAL) BY MOUTH 2 (TWO) TIMES DAILY. 180 tablet 1  . ipratropium-albuterol (DUONEB) 0.5-2.5 (3) MG/3ML SOLN PUT 1 VIAL INTO NEBILZER 2 TIMES A DAY AS NEEDED DX J44.9 180 mL 2  . lactulose (CHRONULAC) 10 GM/15ML solution TAKE 30 MLS BY MOUTH 3 TIMES A DAY AS NEEDED FOR CONSTIPATION 1892 mL 1  . LIVALO 2 MG TABS Take 1 tablet by mouth every evening.   3  . montelukast (SINGULAIR) 10 MG tablet Take 1 tablet (10 mg total) by mouth at bedtime. 30 tablet 11  . Multiple Vitamins-Minerals (MULTIVITAMIN WITH MINERALS) tablet Take 1 tablet by mouth every morning.     . nortriptyline (PAMELOR) 25 MG capsule Take 2 capsules (50 mg total) by mouth at bedtime. 60 capsule 11  . Omega 3 1200 MG CAPS Take 1,200 mg by mouth every morning.     . ondansetron (ZOFRAN) 8 MG tablet Take 8 mg by mouth as directed.    . OXYGEN-HELIUM IN Inhale 3 L into the lungs See admin instructions. Uses when needed during the day, uses continuous throughout the night    . pantoprazole (PROTONIX) 40 MG tablet TAKE 1 TABLET BY MOUTH ONCE A DAY 90 tablet 3  . potassium chloride SA (KLOR-CON M20) 20 MEQ tablet Take 1 tablet (20 mEq total) by mouth daily. 90 tablet 1  . vitamin C (ASCORBIC ACID) 500 MG tablet Take 500 mg by mouth daily.     No current facility-administered medications for this visit.    LABS/IMAGING: No results found for this or any previous visit (from the past 48 hour(s)). No results found.  VITALS: BP 108/74 mmHg  Pulse 90  Ht 5\' 2"  (1.575 m)  Wt 123 lb 12.8 oz (56.155 kg)  BMI 22.64 kg/m2  SpO2 88%  EXAM: General appearance: alert, appears older than stated age, no distress and on oxygen by nasal cannula Neck: no carotid bruit and no JVD Lungs: diminished breath sounds bilaterally and faint rhonchi Heart: regular rate and rhythm, S1, S2 normal and systolic murmur: early systolic 3/6, crescendo at  2nd right intercostal space Abdomen: soft, non-tender; bowel sounds normal; no masses,  no  organomegaly Extremities: extremities normal, atraumatic, no cyanosis or edema Pulses: 2+ and symmetric Skin: Skin color, texture, turgor normal. No rashes or lesions Neurologic: Grossly normal Psych: At baseline mood, affect  EKG: Deferred  ASSESSMENT: 1. Severe COPD with moderate pulmonary hypertension, on chronic oxygen 2. Mild aortic stenosis 3. Low risk nuclear stress testing in 2012 4. Dyslipidemia - on livalo   5. History of CAD with prior remote PCI  PLAN: 1.   Ms. Riggs is doing well with her pulmonary disease which is stable. She's not been hospitalized recently. A repeat echo in May of this year shows stable LV function with mildly improved pulmonary pressures and no change in her aortic stenosis. She continues to take Livalo for dyslipidemia and her recent cholesterol numbers were also well controlled. She denies any recurrent chest pain. No changes were made to her medicines today.  Follow-up with me in 6 months.  Pixie Casino, MD, [facility] Attending Cardiologist [place] 03/31/2016, 2:22 PM

## 2016-03-31 NOTE — Patient Instructions (Signed)
Your physician wants you to follow-up in: 6 months with Dr. Hilty. You will receive a reminder letter in the mail two months in advance. If you don't receive a letter, please call our office to schedule the follow-up appointment.    

## 2016-04-05 ENCOUNTER — Other Ambulatory Visit: Payer: Self-pay | Admitting: Pulmonary Disease

## 2016-04-05 ENCOUNTER — Other Ambulatory Visit: Payer: Self-pay | Admitting: Internal Medicine

## 2016-04-17 ENCOUNTER — Other Ambulatory Visit: Payer: Self-pay | Admitting: Internal Medicine

## 2016-04-20 ENCOUNTER — Other Ambulatory Visit: Payer: Self-pay | Admitting: Internal Medicine

## 2016-05-20 ENCOUNTER — Other Ambulatory Visit: Payer: Self-pay | Admitting: Internal Medicine

## 2016-05-20 ENCOUNTER — Ambulatory Visit (INDEPENDENT_AMBULATORY_CARE_PROVIDER_SITE_OTHER): Payer: Medicare Other | Admitting: General Practice

## 2016-05-20 DIAGNOSIS — I10 Essential (primary) hypertension: Secondary | ICD-10-CM

## 2016-05-20 DIAGNOSIS — M810 Age-related osteoporosis without current pathological fracture: Secondary | ICD-10-CM

## 2016-05-20 MED ORDER — DENOSUMAB 60 MG/ML ~~LOC~~ SOLN
60.0000 mg | Freq: Once | SUBCUTANEOUS | Status: AC
  Administered 2016-05-20: 60 mg via SUBCUTANEOUS

## 2016-05-23 ENCOUNTER — Other Ambulatory Visit: Payer: Self-pay | Admitting: Internal Medicine

## 2016-06-01 ENCOUNTER — Other Ambulatory Visit: Payer: Self-pay | Admitting: Internal Medicine

## 2016-06-03 ENCOUNTER — Other Ambulatory Visit: Payer: Self-pay | Admitting: *Deleted

## 2016-06-03 MED ORDER — ALBUTEROL SULFATE HFA 108 (90 BASE) MCG/ACT IN AERS: INHALATION_SPRAY | RESPIRATORY_TRACT | 5 refills | Status: DC

## 2016-06-15 ENCOUNTER — Other Ambulatory Visit: Payer: Self-pay | Admitting: *Deleted

## 2016-06-15 MED ORDER — ALBUTEROL SULFATE HFA 108 (90 BASE) MCG/ACT IN AERS: INHALATION_SPRAY | RESPIRATORY_TRACT | 5 refills | Status: DC

## 2016-06-15 NOTE — Telephone Encounter (Signed)
Left msg on triage stating call last week needing rx sent on her ventolin inhaler 2 puffs every 6 hours. Called pt back inform [facility] will resend...Johny Chess

## 2016-06-24 ENCOUNTER — Other Ambulatory Visit: Payer: Self-pay | Admitting: Internal Medicine

## 2016-07-19 ENCOUNTER — Other Ambulatory Visit: Payer: Self-pay | Admitting: Internal Medicine

## 2016-07-20 NOTE — Telephone Encounter (Signed)
[facility] 

## 2016-08-02 ENCOUNTER — Telehealth: Payer: Self-pay | Admitting: Internal Medicine

## 2016-08-02 NOTE — Telephone Encounter (Signed)
Please catch me in the morning to sign  MCr

## 2016-08-02 NOTE — Telephone Encounter (Signed)
I spoke w Lorna Few with [employer] concerning a letter of medical necessity for patient's portable oxygen. There is a time-sensitive review needing to be done and they have a form which requires physician signature. I explained that Dr. Debara Pickett is out of office this week, asked if this can be signed by covering MD. She states yes, I provided Dr. Victorino December NPI at request. This will be sent to him for signature.  Message routed for f/u.

## 2016-08-02 NOTE — Telephone Encounter (Signed)
Please call asap

## 2016-08-03 NOTE — Telephone Encounter (Signed)
Still pending receipt of fax

## 2016-08-11 NOTE — Telephone Encounter (Signed)
No fax received

## 2016-08-24 ENCOUNTER — Other Ambulatory Visit: Payer: Self-pay | Admitting: Internal Medicine

## 2016-08-24 DIAGNOSIS — E785 Hyperlipidemia, unspecified: Secondary | ICD-10-CM

## 2016-09-17 ENCOUNTER — Other Ambulatory Visit: Payer: Self-pay | Admitting: Pulmonary Disease

## 2016-09-17 ENCOUNTER — Other Ambulatory Visit: Payer: Self-pay | Admitting: Internal Medicine

## 2016-09-18 NOTE — Telephone Encounter (Signed)
rx faxed to local pharmacy.

## 2016-09-28 ENCOUNTER — Other Ambulatory Visit: Payer: Self-pay | Admitting: Internal Medicine

## 2016-10-15 ENCOUNTER — Other Ambulatory Visit: Payer: Self-pay | Admitting: Internal Medicine

## 2016-10-15 DIAGNOSIS — J438 Other emphysema: Secondary | ICD-10-CM

## 2016-10-15 DIAGNOSIS — J42 Unspecified chronic bronchitis: Secondary | ICD-10-CM

## 2016-10-18 NOTE — Telephone Encounter (Signed)
Alprazolam faxed to [facility].

## 2016-11-24 ENCOUNTER — Other Ambulatory Visit: Payer: Self-pay | Admitting: Internal Medicine

## 2016-11-26 ENCOUNTER — Ambulatory Visit (INDEPENDENT_AMBULATORY_CARE_PROVIDER_SITE_OTHER): Payer: Medicare Other

## 2016-11-26 DIAGNOSIS — M810 Age-related osteoporosis without current pathological fracture: Secondary | ICD-10-CM | POA: Diagnosis not present

## 2016-11-26 MED ORDER — DENOSUMAB 60 MG/ML ~~LOC~~ SOLN
60.0000 mg | Freq: Once | SUBCUTANEOUS | Status: AC
  Administered 2016-11-26: 60 mg via SUBCUTANEOUS

## 2016-11-26 NOTE — Progress Notes (Signed)
Medical treatment/procedure(s) were performed by non-physician practitioner and as supervising physician I was immediately available for consultation/collaboration. I agree with above. Oria Klimas A Keith Cancio, MD  

## 2016-11-30 ENCOUNTER — Telehealth: Payer: Self-pay | Admitting: Pulmonary Disease

## 2016-11-30 NOTE — Telephone Encounter (Signed)
Called [facility] and spoke Janett Billow  I advised we will not be authorizing any refills without ov  Pt not seen since 2016  She verbalized understanding and nothing further needed

## 2016-12-02 ENCOUNTER — Telehealth: Payer: Self-pay | Admitting: Internal Medicine

## 2016-12-02 NOTE — Telephone Encounter (Signed)
Patient has made appt for 3/27.  Patient is out of medication.  Please send enough medication to get patient through until appt.  Please follow up at [phone].

## 2016-12-03 MED ORDER — POTASSIUM CHLORIDE CRYS ER 20 MEQ PO TBCR: 20.0000 meq | EXTENDED_RELEASE_TABLET | Freq: Every day | ORAL | 0 refills | Status: DC

## 2016-12-03 NOTE — Telephone Encounter (Signed)
erx sent. Detailed message left.

## 2016-12-03 NOTE — Addendum Note (Signed)
Addended by: Aviva Signs M on: 12/03/2016 09:40 AM   Modules accepted: Orders

## 2016-12-12 ENCOUNTER — Other Ambulatory Visit: Payer: Self-pay | Admitting: Internal Medicine

## 2016-12-14 ENCOUNTER — Other Ambulatory Visit: Payer: Self-pay | Admitting: Internal Medicine

## 2016-12-17 ENCOUNTER — Ambulatory Visit (INDEPENDENT_AMBULATORY_CARE_PROVIDER_SITE_OTHER): Payer: Medicare Other | Admitting: Internal Medicine

## 2016-12-17 ENCOUNTER — Encounter: Payer: Self-pay | Admitting: Internal Medicine

## 2016-12-17 VITALS — BP 102/62 | HR 87 | Ht 62.0 in | Wt 129.0 lb

## 2016-12-17 DIAGNOSIS — I350 Nonrheumatic aortic (valve) stenosis: Secondary | ICD-10-CM | POA: Diagnosis not present

## 2016-12-17 DIAGNOSIS — I10 Essential (primary) hypertension: Secondary | ICD-10-CM | POA: Diagnosis not present

## 2016-12-17 DIAGNOSIS — J438 Other emphysema: Secondary | ICD-10-CM

## 2016-12-17 DIAGNOSIS — E782 Mixed hyperlipidemia: Secondary | ICD-10-CM | POA: Diagnosis not present

## 2016-12-17 NOTE — Progress Notes (Signed)
OFFICE NOTE  Chief Complaint:  No complaints  Primary Care Physician: Scarlette Calico, MD  HPI:  [name] a pleasant 71 year old female with a history of COPD and severe pulmonary hypertension. Recently she was put on home oxygen and has been on an oxygen concentrator, which has made a marked improvement in her ability to get around. She has previously seen Dr. Lamonte Sakai with pulmonology; however, that did not go too well and she has basically given up on any further treatments for COPD other than her oxygen. She still uses nebulizers as needed when she is tight, and I recommended Claritin for seasonal allergies today. As you know, she also has mild aortic stenosis and we are continuing to follow that. She is a low-risk stress test in November 2012. Unfortunately she was recently admitted for altered mental status, possible Tylenol overdose, respiratory failure and liver failure. All of which seems to have resolved. She seems to be doing pre-well after discharge.  She returns today for followup. She reports doing fairly well. She has seen Dr. Lake Bells in the interim and he is recommended continue current therapies. She is now seeing a different internist, Dr. Ronnald Ramp. He was recently admitted the hospital for pneumonia and treated accordingly. She had troponins which were negative suggesting they're likely is no significant underlying obstructive coronary disease. She reports she's recovered and is back to her base. She is overdue for repeat lipid profile.  I saw [name] back in the office today. Overall she reports she is doing fairly well. She's not been admitted to the hospital recently. She says she has stable shortness of breath. She denies any chest pain. It should be noted that she does have mild aortic stenosis which will last assessed in 2014.  [name] returns today for follow-up. She reports that she is doing fairly well. Fortune she's not been hospitalized. She has had an  episode of COPD exacerbation which was supported with early antibiotics. She denies any worsening chest pain and has pretty stable shortness of breath on oxygen. She is able to ambulate and do most of her activities without any restrictions.  03/31/2016  [name] was seen back today in the office for follow-up. She seems to be doing fairly well although does use her oxygen almost continuously. Saturation was low today although she had heavy fingernail polish on and therefore the reading may not be accurate as it read 88%. She was on oxygen via a concentrator. Blood pressure was stable at 108/74. Weight is been stable. She recently had a repeat echocardiogram which shows stable mild aortic stenosis and normal LV function with some mild improvement in pulmonary pressures in May. She denies any chest pain. Fortune that she's not been admitted to the hospital recently for any pneumonia but did have problems with recent gallbladder disease and had cholecystectomy.  12/17/2016  [name] returns today for follow-up. She's done well over the last year has not been hospitalized. She reports stable shortness of breath. She had mild aortic stenosis which is stable by echo last year and will need to be repeated in May of this year. Her last lipid profile showed total cholesterol 197, HDL C 78, triglycerides 100, an LDL-C 99. We'll need to repeat her lipid profile as well.  PMHx:  Past Medical History:  Diagnosis Date  . Abnormal LFTs 08/02/2011  . Acute liver failure 06/19/2013  . Acute renal failure ([place]) 06/19/2013  . Acute respiratory failure ([place]) 06/19/2013  . Altered mental status  08/01/2011  . Angina   . Anxiety   . Anxiety state, unspecified 12/03/2013  . Aortic stenosis    mild  . Breast cancer of upper-outer quadrant of left female breast ([place]) 08/21/2013  . CAD (coronary artery disease) of artery bypass graft 11/06/2013  . CAD (coronary artery disease), MI R/O 08/01/2011   PCI in 2006 (bare metal stent,  unknown artery) - [place]   . Cancer of breast ([place]) dx'd 2008   RT Breast  . CHF,  acute diastolic, BNP 4k on admissio 08/01/2011  . Chronic respiratory failure ([place]) 02/02/2012  . Compression fracture of L1 lumbar vertebra ([place]) 02/02/2012  . COPD (chronic obstructive pulmonary disease) ([facility])    oxygen-dependent 4LPM [place]  . Coronary artery disease 2006   2 stents w/previous MI  . Depressed   . Diastolic CHF ([place])   . Dyslipidemia   . Heart murmur   . 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 History of breast cancer in female 06/23/2013  . History of nuclear stress test 08/03/2011   attenuation at apex - no perfusion defects   . HTN (hypertension) 11/06/2013  . Hyperkalemia, on ACE prior to admission 11/11/2011  . Hypertension   . Hyponatremia 01/31/2012  . Migraine   . Mild aortic stenosis 08/01/2011   AVA 1.69 cm2 (06/02/2011)   . Moderate to severe pulmonary hypertension   . Myocardial infarction 2006   NSTEMI  . NSVT (nonsustained ventricular tachycardia) (H[facility])    h/o    Past Surgical History:  Procedure Laterality Date  . BREAST LUMPECTOMY Right 2008  . CARDIAC CATHETERIZATION  2006  . CHOLECYSTECTOMY  2011  . ERCP N/A 09/05/2015   Procedure: ENDOSCOPIC RETROGRADE CHOLANGIOPANCREATOGRAPHY (ERCP);  Surgeon: Ladene Artist, MD;  Location: [facility];  Service: Endoscopy;  Laterality: N/A;  . PARTIAL COLECTOMY  2009   for obstruction: temporary ostomy, later reversed.   Marland Kitchen RIGHT HEART CATHETERIZATION N/A 09/05/2013   Procedure: RIGHT HEART CATH;  Surgeon: Jolaine Artist, MD;  Location: [facility] CATH LAB;  Service: Cardiovascular;  Laterality: N/A;  . TRANSTHORACIC ECHOCARDIOGRAM  11/12/2011   EF 49-44%, normal systolic function, grade 1 diastolic dysfunction; ventricular septal flattening (D-sign); mild AS; trace-mild MR; LA mildly dilated; RV mod dilated; RA mod dilated; severe pulm HTN; elevated CVP    FAMHx:  Family History  Problem Relation Age of Onset  . Schizophrenia Sister   .  Alzheimer's disease Mother   . Hyperlipidemia Brother   . Heart disease Sister   . Diabetes Sister   . Cancer Neg Hx   . Stroke Neg Hx   . COPD Neg Hx   . Depression Neg Hx   . Drug abuse Neg Hx   . Early death Neg Hx   . Hypertension Neg Hx   . Kidney disease Neg Hx     SOCHx:   reports that she has been smoking Cigarettes.  She has a 11.75 pack-year smoking history. She has never used smokeless tobacco. She reports that she does not drink alcohol or use drugs.  ALLERGIES:  Allergies  Allergen Reactions  . Ceftriaxone Anaphylaxis and Other (See Comments)    *ROCEPHIN*  "Blow up like a balloon"  . Hydroxyzine Shortness Of Breath and Other (See Comments)    Pt states med make her light headed, get sob sxs  . Lexapro [Escitalopram] Other (See Comments)    Pt states med make her dizzy    ROS: Pertinent items noted in HPI and remainder of  comprehensive ROS otherwise negative.  HOME MEDS: Current Outpatient Prescriptions  Medication Sig Dispense Refill  . ALPRAZolam (XANAX) 1 MG tablet TAKE 1 TABLET BY MOUTH TWICE A DAY AS NEEDED 60 tablet 2  . Artificial Tear Ointment (DRY EYES OP) Apply 2 drops to eye daily as needed.    Marland Kitchen aspirin 325 MG EC tablet Take 1 tablet (325 mg total) by mouth every morning.    . ATROVENT HFA 17 MCG/ACT inhaler USE 2 PUFFS EVERY FOUR HOURS AS NEEDED 77.4 Inhaler 11  . butalbital-acetaminophen-caffeine (FIORICET, ESGIC) 50-325-40 MG tablet TAKE 1 TO 2 TABLETS BY MOUTH AS NEEDED FOR HEADACHE 60 tablet 5  . cholecalciferol (VITAMIN D) 1000 units tablet Take 1,000 Units by mouth daily.    . diclofenac sodium (VOLTAREN) 1 % GEL APPLY 4 G TOPICALLY DAILY AS NEEDED (APPLY TO HANDS). HAND PAIN  2  . fluticasone (FLONASE) 50 MCG/ACT nasal spray USE 2 SPRAYS INTO EACH NOSTRIL ONCE DAILY**REPEAT FOR 5 DAYS THEN STOP 16 g 11  . furosemide (LASIX) 40 MG tablet TAKE 1 TABLET BY MOUTH TWICE A DAY 180 tablet 1  . ipratropium-albuterol (DUONEB) 0.5-2.5 (3) MG/3ML SOLN  USE 1 VIAL IN NEBILZER 2 TIMES A DAY AS NEEDED**DX J44.9 180 mL 1  . LIVALO 2 MG TABS Take 1 tablet by mouth every evening.   3  . montelukast (SINGULAIR) 10 MG tablet TAKE 1 TABLET (10 MG TOTAL) BY MOUTH AT BEDTIME. 30 tablet 11  . Multiple Vitamins-Minerals (MULTIVITAMIN WITH MINERALS) tablet Take 1 tablet by mouth every morning.     . nortriptyline (PAMELOR) 25 MG capsule TAKE 2 CAPSULES BY MOUTH AT BEDTIME 60 capsule 11  . Omega 3 1200 MG CAPS Take 1,200 mg by mouth every morning.     . ondansetron (ZOFRAN) 8 MG tablet Take 8 mg by mouth as directed.    . OXYGEN-HELIUM IN Inhale 3 L into the lungs See admin instructions. Uses when needed during the day, uses continuous throughout the night    . pantoprazole (PROTONIX) 40 MG tablet TAKE 1 TABLET BY MOUTH ONCE A DAY 90 tablet 3  . potassium chloride SA (KLOR-CON M20) 20 MEQ tablet Take 1 tablet (20 mEq total) by mouth daily. 30 tablet 0  . SYMBICORT 160-4.5 MCG/ACT inhaler INHALE 2 PUFFS INTO THE LUNGS 2 (TWO) TIMES DAILY. 10.2 Inhaler 11  . VENTOLIN HFA 108 (90 Base) MCG/ACT inhaler INHALE 2 PUFFS INTO THE LUNGS EVERY 4 HOURS AS NEEDED 36 Inhaler 5  . vitamin C (ASCORBIC ACID) 500 MG tablet Take 500 mg by mouth daily.     No current facility-administered medications for this visit.     LABS/IMAGING: No results found for this or any previous visit (from the past 48 hour(s)). No results found.  VITALS: BP 102/62   Pulse 87   Ht 5\' 2"  (1.575 m)   Wt 129 lb (58.5 kg)   BMI 23.59 kg/m   EXAM: General appearance: alert, appears older than stated age, no distress and on oxygen by nasal cannula Neck: no carotid bruit and no JVD Lungs: diminished breath sounds bilaterally and faint rhonchi Heart: regular rate and rhythm, S1, S2 normal and systolic murmur: early systolic 3/6, crescendo at 2nd right intercostal space Abdomen: soft, non-tender; bowel sounds normal; no masses,  no organomegaly Extremities: extremities normal, atraumatic, no  cyanosis or edema Pulses: 2+ and symmetric Skin: Skin color, texture, turgor normal. No rashes or lesions Neurologic: Grossly normal Psych: At baseline mood, affect  EKG: Sinus rhythm at 87  ASSESSMENT: 1. Severe COPD with moderate pulmonary hypertension, on chronic oxygen (disabled d/t this) 2. Mild aortic stenosis 3. Low risk nuclear stress testing in 2012 4. Dyslipidemia - on livalo   5. History of CAD with prior remote PCI  PLAN: 1.   Ms. Sundberg reports her dyspnea is stable. Her aortic murmur sounds a little moderate to be today. She's had stable mild AST by echo which was last performed in May 2017. I like to repeat that study in May 2018. She is also due for repeat lipid testing. She is on Livalo 2 mg daily - LDL was just lower than 100. We may need to consider increasing to the 4 mg doses at the moderate intensity statin. 4 choices she's not had any side effects with it.  Follow-up with me in 6 months.  Pixie Casino, MD, [facility] Attending Cardiologist Quincy C Hilty 12/17/2016, 2:52 PM

## 2016-12-17 NOTE — Patient Instructions (Signed)
Your physician has requested that you have an echocardiogram @ [address] - [address]. Echocardiography is a painless test that uses sound waves to create images of your heart. It provides your doctor with information about the size and shape of your heart and how well your heart's chambers and valves are working. This procedure takes approximately one hour. There are no restrictions for this procedure.   Your physician wants you to follow-up in: 6 months with Dr. Hilty. You will receive a reminder letter in the mail two months in advance. If you don't receive a letter, please call our office to schedule the follow-up appointment.  

## 2016-12-21 ENCOUNTER — Encounter: Payer: Self-pay | Admitting: Internal Medicine

## 2016-12-21 ENCOUNTER — Other Ambulatory Visit (INDEPENDENT_AMBULATORY_CARE_PROVIDER_SITE_OTHER): Payer: Medicare Other

## 2016-12-21 ENCOUNTER — Ambulatory Visit (INDEPENDENT_AMBULATORY_CARE_PROVIDER_SITE_OTHER): Payer: Medicare Other | Admitting: Internal Medicine

## 2016-12-21 VITALS — BP 140/72 | HR 77 | Temp 97.7°F | Ht 62.0 in | Wt 124.4 lb

## 2016-12-21 DIAGNOSIS — D508 Other iron deficiency anemias: Secondary | ICD-10-CM

## 2016-12-21 DIAGNOSIS — K5900 Constipation, unspecified: Secondary | ICD-10-CM

## 2016-12-21 DIAGNOSIS — I10 Essential (primary) hypertension: Secondary | ICD-10-CM

## 2016-12-21 DIAGNOSIS — I2510 Atherosclerotic heart disease of native coronary artery without angina pectoris: Secondary | ICD-10-CM

## 2016-12-21 DIAGNOSIS — D539 Nutritional anemia, unspecified: Secondary | ICD-10-CM

## 2016-12-21 DIAGNOSIS — E785 Hyperlipidemia, unspecified: Secondary | ICD-10-CM

## 2016-12-21 LAB — CBC WITH DIFFERENTIAL/PLATELET
Basophils Absolute: 0.1 10*3/uL (ref 0.0–0.1)
Basophils Relative: 0.9 % (ref 0.0–3.0)
Eosinophils Absolute: 0.1 10*3/uL (ref 0.0–0.7)
Eosinophils Relative: 1.5 % (ref 0.0–5.0)
HCT: 38.3 % (ref 36.0–46.0)
Hemoglobin: 12.7 g/dL (ref 12.0–15.0)
Lymphocytes Relative: 18.1 % (ref 12.0–46.0)
Lymphs Abs: 1.4 10*3/uL (ref 0.7–4.0)
MCHC: 33.2 g/dL (ref 30.0–36.0)
MCV: 92.1 fl (ref 78.0–100.0)
Monocytes Absolute: 0.5 10*3/uL (ref 0.1–1.0)
Monocytes Relative: 6.5 % (ref 3.0–12.0)
Neutro Abs: 5.5 10*3/uL (ref 1.4–7.7)
Neutrophils Relative %: 73 % (ref 43.0–77.0)
Platelets: 329 10*3/uL (ref 150.0–400.0)
RBC: 4.16 Mil/uL (ref 3.87–5.11)
RDW: 14.6 % (ref 11.5–15.5)
WBC: 7.6 10*3/uL (ref 4.0–10.5)

## 2016-12-21 LAB — COMPREHENSIVE METABOLIC PANEL
ALT: 21 U/L (ref 0–35)
AST: 25 U/L (ref 0–37)
Albumin: 4.1 g/dL (ref 3.5–5.2)
Alkaline Phosphatase: 65 U/L (ref 39–117)
BUN: 10 mg/dL (ref 6–23)
CO2: 33 mEq/L — ABNORMAL HIGH (ref 19–32)
Calcium: 9.4 mg/dL (ref 8.4–10.5)
Chloride: 98 mEq/L (ref 96–112)
Creatinine, Ser: 0.94 mg/dL (ref 0.40–1.20)
GFR: 62.39 mL/min (ref >60.00)
Glucose, Bld: 84 mg/dL (ref 70–99)
Potassium: 3.7 mEq/L (ref 3.5–5.1)
Sodium: 139 mEq/L (ref 135–145)
Total Bilirubin: 0.3 mg/dL (ref 0.2–1.2)
Total Protein: 8.1 g/dL (ref 6.0–8.3)

## 2016-12-21 LAB — FERRITIN: Ferritin: 19.3 ng/mL (ref 10.0–291.0)

## 2016-12-21 LAB — LIPID PANEL
Cholesterol: 186 mg/dL (ref 0–200)
HDL: 72.7 mg/dL (ref >39.00)
LDL Cholesterol: 97 mg/dL (ref 0–99)
NonHDL: 113.57
Total CHOL/HDL Ratio: 3
Triglycerides: 85 mg/dL (ref 0.0–149.0)
VLDL: 17 mg/dL (ref 0.0–40.0)

## 2016-12-21 LAB — IBC PANEL
Iron: 26 ug/dL — ABNORMAL LOW (ref 42–145)
Saturation Ratios: 5.9 % — ABNORMAL LOW (ref 20.0–50.0)
Transferrin: 313 mg/dL (ref 212.0–360.0)

## 2016-12-21 LAB — RETICULOCYTES
ABS Retic: 46750 cells/uL (ref 20000–80000)
RBC.: 4.25 MIL/uL (ref 3.80–5.10)
Retic Ct Pct: 1.1 %

## 2016-12-21 NOTE — Progress Notes (Addendum)
Subjective:  Patient ID: [name], female    DOB: [birth year]  Age: 71 y.o. MRN: [id number]  CC: Coronary Artery Disease; Hyperlipidemia; Anemia; and Medicare Wellness   HPI [name] presents for f/up - She has been doing well and offers no new complaints. She has mild chronic shortness of breath but no recent episodes are of cough or hemoptysis. She offers no new complaints today.  Outpatient Medications Prior to Visit  Medication Sig Dispense Refill  . ALPRAZolam (XANAX) 1 MG tablet TAKE 1 TABLET BY MOUTH TWICE A DAY AS NEEDED 60 tablet 2  . Artificial Tear Ointment (DRY EYES OP) Apply 2 drops to eye daily as needed.    Marland Kitchen aspirin 325 MG EC tablet Take 1 tablet (325 mg total) by mouth every morning.    . ATROVENT HFA 17 MCG/ACT inhaler USE 2 PUFFS EVERY FOUR HOURS AS NEEDED 77.4 Inhaler 11  . butalbital-acetaminophen-caffeine (FIORICET, ESGIC) 50-325-40 MG tablet TAKE 1 TO 2 TABLETS BY MOUTH AS NEEDED FOR HEADACHE 60 tablet 5  . cholecalciferol (VITAMIN D) 1000 units tablet Take 1,000 Units by mouth daily.    . diclofenac sodium (VOLTAREN) 1 % GEL APPLY 4 G TOPICALLY DAILY AS NEEDED (APPLY TO HANDS). HAND PAIN  2  . fluticasone (FLONASE) 50 MCG/ACT nasal spray USE 2 SPRAYS INTO EACH NOSTRIL ONCE DAILY**REPEAT FOR 5 DAYS THEN STOP 16 g 11  . furosemide (LASIX) 40 MG tablet TAKE 1 TABLET BY MOUTH TWICE A DAY 180 tablet 1  . ipratropium-albuterol (DUONEB) 0.5-2.5 (3) MG/3ML SOLN USE 1 VIAL IN NEBILZER 2 TIMES A DAY AS NEEDED**DX J44.9 180 mL 1  . LIVALO 2 MG TABS Take 1 tablet by mouth every evening.   3  . montelukast (SINGULAIR) 10 MG tablet TAKE 1 TABLET (10 MG TOTAL) BY MOUTH AT BEDTIME. 30 tablet 11  . nortriptyline (PAMELOR) 25 MG capsule TAKE 2 CAPSULES BY MOUTH AT BEDTIME 60 capsule 11  . Omega 3 1200 MG CAPS Take 1,200 mg by mouth every morning.     . ondansetron (ZOFRAN) 8 MG tablet Take 8 mg by mouth as directed.    . OXYGEN-HELIUM IN Inhale 3 L into the lungs See  admin instructions. Uses when needed during the day, uses continuous throughout the night    . pantoprazole (PROTONIX) 40 MG tablet TAKE 1 TABLET BY MOUTH ONCE A DAY 90 tablet 3  . potassium chloride SA (KLOR-CON M20) 20 MEQ tablet Take 1 tablet (20 mEq total) by mouth daily. 30 tablet 0  . SYMBICORT 160-4.5 MCG/ACT inhaler INHALE 2 PUFFS INTO THE LUNGS 2 (TWO) TIMES DAILY. 10.2 Inhaler 11  . VENTOLIN HFA 108 (90 Base) MCG/ACT inhaler INHALE 2 PUFFS INTO THE LUNGS EVERY 4 HOURS AS NEEDED 36 Inhaler 5  . vitamin C (ASCORBIC ACID) 500 MG tablet Take 500 mg by mouth daily.    . Multiple Vitamins-Minerals (MULTIVITAMIN WITH MINERALS) tablet Take 1 tablet by mouth every morning.      No facility-administered medications prior to visit.     ROS Review of Systems  Constitutional: Negative for appetite change, diaphoresis, fatigue, fever and unexpected weight change.  HENT: Negative.  Negative for facial swelling, sinus pressure and trouble swallowing.   Eyes: Negative for visual disturbance.  Respiratory: Positive for shortness of breath. Negative for cough, chest tightness, wheezing and stridor.   Cardiovascular: Negative.  Negative for chest pain, palpitations and leg swelling.  Gastrointestinal: Positive for constipation. Negative for abdominal pain, blood  in stool, diarrhea, nausea and vomiting.  Endocrine: Negative.   Genitourinary: Negative.  Negative for decreased urine volume, difficulty urinating, dysuria and vaginal bleeding.  Musculoskeletal: Negative.  Negative for arthralgias, back pain, myalgias and neck pain.  Skin: Negative for color change and rash.  Allergic/Immunologic: Negative.   Neurological: Negative.  Negative for dizziness, tremors, weakness, light-headedness, numbness and headaches.  Hematological: Negative for adenopathy. Does not bruise/bleed easily.  Psychiatric/Behavioral: Negative for confusion, decreased concentration, dysphoric mood, hallucinations, sleep  disturbance and suicidal ideas. The patient is nervous/anxious.     Objective:  BP 140/72 (BP Location: Right Arm, Patient Position: Sitting, Cuff Size: Normal)   Pulse 77   Temp 97.7 F (36.5 C) (Oral)   Ht 5\' 2"  (1.575 m)   Wt 124 lb 6 oz (56.4 kg)   SpO2 98%   BMI 22.75 kg/m   BP Readings from Last 3 Encounters:  12/21/16 140/72  12/17/16 102/62  03/31/16 108/74    Wt Readings from Last 3 Encounters:  12/21/16 124 lb 6 oz (56.4 kg)  12/17/16 129 lb (58.5 kg)  03/31/16 123 lb 12.8 oz (56.2 kg)    Physical Exam  Constitutional: She is oriented to person, place, and time. No distress.  HENT:  Mouth/Throat: Oropharynx is clear and moist. No oropharyngeal exudate.  Eyes: Conjunctivae are normal. Right eye exhibits no discharge. Left eye exhibits no discharge. No scleral icterus.  Neck: Normal range of motion. Neck supple. No JVD present. No tracheal deviation present. No thyromegaly present.  Cardiovascular: Normal rate, regular rhythm and intact distal pulses.  Exam reveals no gallop and no friction rub.   Murmur heard.  Systolic murmur is present with a grade of 2/6   No diastolic murmur is present  Pulmonary/Chest: Effort normal and breath sounds normal. No stridor. No respiratory distress. She has no wheezes. She has no rales. She exhibits no tenderness.  Abdominal: Soft. Bowel sounds are normal. She exhibits no distension and no mass. There is no tenderness. There is no rebound and no guarding.  Musculoskeletal: Normal range of motion. She exhibits no edema, tenderness or deformity.  Lymphadenopathy:    She has no cervical adenopathy.  Neurological: She is oriented to person, place, and time.  Skin: Skin is warm and dry. No rash noted. She is not diaphoretic. No erythema. No pallor.  Psychiatric: She has a normal mood and affect. Her behavior is normal. Judgment and thought content normal.  Vitals reviewed.   Lab Results  Component Value Date   WBC 7.6 12/21/2016     HGB 12.7 12/21/2016   HCT 38.3 12/21/2016   PLT 329.0 12/21/2016   GLUCOSE 84 12/21/2016   CHOL 186 12/21/2016   TRIG 85.0 12/21/2016   HDL 72.70 12/21/2016   LDLCALC 97 12/21/2016   ALT 21 12/21/2016   AST 25 12/21/2016   NA 139 12/21/2016   K 3.7 12/21/2016   CL 98 12/21/2016   CREATININE 0.94 12/21/2016   BUN 10 12/21/2016   CO2 33 (H) 12/21/2016   TSH 1.17 12/21/2016   INR 0.87 09/05/2013   HGBA1C 5.3 10/16/2015    Dg Chest 2 View  Result Date: 03/03/2016 CLINICAL DATA:  Followup of pneumonia, former smoking history EXAM: CHEST  2 VIEW COMPARISON:  Chest x-ray of 02/12/2016 FINDINGS: The left upper lobe and lingular pneumonia has largely cleared with minimally prominent markings remaining anteriorly on the lateral view in the region of the upper lobe on the left. No pleural effusion is seen.  Mediastinal and hilar contours are unremarkable and the heart is mildly enlarged and stable. No acute bony abnormality is seen. Surgical clips overlie the right axilla. IMPRESSION: Clearing of lingular pneumonia. Minimally prominent markings remain in the left upper lobe anteriorly possibly chronic in nature. Electronically Signed   By: Ivar Drape M.D.   On: 03/03/2016 15:33    Assessment & Plan:   [name] was seen today for coronary artery disease, hyperlipidemia, anemia and medicare wellness.  Diagnoses and all orders for this visit:  Coronary artery disease involving native coronary artery of native heart without angina pectoris- she is had no recent episodes of angina, will continue risk factor modification with statin therapy. -     Lipid panel; Future  Essential hypertension- her blood pressures adequately well-controlled. -     CBC with Differential/Platelet; Future  Hyperlipidemia with target LDL less than 70- she is not quite achieved her LDL goal but is also not willing to take any additional measures to lower her LDL. -     Lipid panel; Future -     Thyroid Panel With TSH;  Future  Constipation, unspecified constipation type- her labs are negative for any secondary or metabolic causes, she is getting symptom relief with over-the-counter products. -     Comprehensive metabolic panel; Future -     Thyroid Panel With TSH; Future  Deficiency anemia- her H&H have improved but she has iron deficiency anemia. Her B12 and folate levels are normal. -     Vitamin B12; Future -     IBC panel; Future -     Folate; Future -     Ferritin; Future -     Reticulocytes; Future -     FeAsp-B12-FA-C-DSS-SuccAc-Zn (FERIVA 21/7) 75-1 MG TABS; Take 1 tablet by mouth daily.  Other iron deficiency anemia -     FeAsp-B12-FA-C-DSS-SuccAc-Zn (FERIVA 21/7) 75-1 MG TABS; Take 1 tablet by mouth daily.   I have discontinued Ms. Ruis's multivitamin with minerals. I am also having her start on [place] 21/7. Additionally, I am having her maintain her OXYGEN-HELIUM IN, Omega 3, aspirin, diclofenac sodium, LIVALO, cholecalciferol, Artificial Tear Ointment (DRY EYES OP), vitamin C, ondansetron, fluticasone, ATROVENT HFA, nortriptyline, ipratropium-albuterol, butalbital-acetaminophen-caffeine, montelukast, pantoprazole, SYMBICORT, furosemide, ALPRAZolam, potassium chloride SA, and VENTOLIN HFA.  Meds ordered this encounter  Medications  . FeAsp-B12-FA-C-DSS-SuccAc-Zn (FERIVA 21/7) 75-1 MG TABS    Sig: Take 1 tablet by mouth daily.    Dispense:  28 tablet    Refill:  5   Medical screening examination/treatment/procedure(s) were performed by non-physician practitioner and as supervising physician I was immediately available for consultation/collaboration. I agree with above. Scarlette Calico, MD   Follow-up: Return in about 6 months (around 06/23/2017).  Scarlette Calico, MD

## 2016-12-21 NOTE — Progress Notes (Signed)
Subjective:   [name] is a 71 y.o. female who presents for Medicare Annual (Subsequent) preventive examination.  Review of Systems:  No ROS.  Medicare Wellness Visit.  Cardiac Risk Factors include: dyslipidemia;advanced age (>15men, >47 women);family history of premature cardiovascular disease;smoking/ tobacco exposure Sleep patterns: no sleep issues, feels rested on waking, gets up 1 times nightly to void and sleeps 7-8 hours nightly.   Home Safety/Smoke Alarms: Feels safe in home. Smoke alarms in place.     Living environment; residence and Firearm Safety: 1-story house/ trailer, no firearms. Lives with daughter and grand-children, no needs for DME Seat Belt Safety/Bike Helmet: Wears seat belt.   Counseling:   Eye Exam- goes yearly has an appointment next week Dental- dentures  Female:   Pap- N/A       Mammo- Last 12/22/15,  BI-RADS CATEGORY  1: Negative   Dexa scan- Last 10/22/15, osteoporosis, prolia      CCS- Last 05/15/08, normal      Objective:     Vitals: BP 140/72 (BP Location: Right Arm, Patient Position: Sitting, Cuff Size: Normal)   Pulse 77   Temp 97.7 F (36.5 C) (Oral)   Ht 5\' 2"  (1.575 m)   Wt 124 lb 6 oz (56.4 kg)   SpO2 98%   BMI 22.75 kg/m   Body mass index is 22.75 kg/m.   Tobacco History  Smoking Status  . Current Some Day Smoker  . Packs/day: 0.25  . Years: 47.00  . Types: Cigarettes  Smokeless Tobacco  . Never Used    Comment: smokes when anxious, uses eCigs as well.      Ready to quit: Not Answered Counseling given: Not Answered   Past Medical History:  Diagnosis Date  . Abnormal LFTs 08/02/2011  . Acute liver failure 06/19/2013  . Acute renal failure ([place]) 06/19/2013  . Acute respiratory failure ([place]) 06/19/2013  . Altered mental status 08/01/2011  . Angina   . Anxiety   . Anxiety state, unspecified 12/03/2013  . Aortic stenosis    mild  . Breast cancer of upper-outer quadrant of left female breast ([place]) 08/21/2013  . CAD  (coronary artery disease) of artery bypass graft 11/06/2013  . CAD (coronary artery disease), MI R/O 08/01/2011   PCI in 2006 (bare metal stent, unknown artery) - [place]   . Cancer of breast ([place]) dx'd 2008   RT Breast  . CHF,  acute diastolic, BNP 4k on admissio 08/01/2011  . Chronic respiratory failure ([place]) 02/02/2012  . Compression fracture of L1 lumbar vertebra ([place]) 02/02/2012  . COPD (chronic obstructive pulmonary disease) (H[facility])    oxygen-dependent 4LPM [place]  . Coronary artery disease 2006   2 stents w/previous MI  . Depressed   . Diastolic CHF ([place])   . Dyslipidemia   . Heart murmur   . 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 History of breast cancer in female 06/23/2013  . History of nuclear stress test 08/03/2011   attenuation at apex - no perfusion defects   . HTN (hypertension) 11/06/2013  . Hyperkalemia, on ACE prior to admission 11/11/2011  . Hypertension   . Hyponatremia 01/31/2012  . Migraine   . Mild aortic stenosis 08/01/2011   AVA 1.69 cm2 (06/02/2011)   . Moderate to severe pulmonary hypertension   . Myocardial infarction 2006   NSTEMI  . NSVT (nonsustained ventricular tachycardia) (HCC)    h/o   Past Surgical History:  Procedure Laterality Date  . BREAST LUMPECTOMY Right 2008  .  CARDIAC CATHETERIZATION  2006  . CHOLECYSTECTOMY  2011  . ERCP N/A 09/05/2015   Procedure: ENDOSCOPIC RETROGRADE CHOLANGIOPANCREATOGRAPHY (ERCP);  Surgeon: Ladene Artist, MD;  Location: [facility];  Service: Endoscopy;  Laterality: N/A;  . PARTIAL COLECTOMY  2009   for obstruction: temporary ostomy, later reversed.   Marland Kitchen RIGHT HEART CATHETERIZATION N/A 09/05/2013   Procedure: RIGHT HEART CATH;  Surgeon: Jolaine Artist, MD;  Location[facility];  Service: Cardiovascular;  Laterality: N/A;  . TRANSTHORACIC ECHOCARDIOGRAM  11/12/2011   EF 12-87%, normal systolic function, grade 1 diastolic dysfunction; ventricular septal flattening (D-sign); mild AS; trace-mild MR; LA mildly dilated; RV mod  dilated; RA mod dilated; severe pulm HTN; elevated CVP   Family History  Problem Relation Age of Onset  . Schizophrenia Sister   . Alzheimer's disease Mother   . Hyperlipidemia Brother   . Heart disease Sister   . Diabetes Sister   . Cancer Neg Hx   . Stroke Neg Hx   . COPD Neg Hx   . Depression Neg Hx   . Drug abuse Neg Hx   . Early death Neg Hx   . Hypertension Neg Hx   . Kidney disease Neg Hx    History  Sexual Activity  . Sexual activity: Not Currently    Comment: 3 cig a day    Outpatient Encounter Prescriptions as of 12/21/2016  Medication Sig  . ALPRAZolam (XANAX) 1 MG tablet TAKE 1 TABLET BY MOUTH TWICE A DAY AS NEEDED  . Artificial Tear Ointment (DRY EYES OP) Apply 2 drops to eye daily as needed.  Marland Kitchen aspirin 325 MG EC tablet Take 1 tablet (325 mg total) by mouth every morning.  . ATROVENT HFA 17 MCG/ACT inhaler USE 2 PUFFS EVERY FOUR HOURS AS NEEDED  . butalbital-acetaminophen-caffeine (FIORICET, ESGIC) 50-325-40 MG tablet TAKE 1 TO 2 TABLETS BY MOUTH AS NEEDED FOR HEADACHE  . cholecalciferol (VITAMIN D) 1000 units tablet Take 1,000 Units by mouth daily.  . diclofenac sodium (VOLTAREN) 1 % GEL APPLY 4 G TOPICALLY DAILY AS NEEDED (APPLY TO HANDS). HAND PAIN  . fluticasone (FLONASE) 50 MCG/ACT nasal spray USE 2 SPRAYS INTO EACH NOSTRIL ONCE DAILY**REPEAT FOR 5 DAYS THEN STOP  . furosemide (LASIX) 40 MG tablet TAKE 1 TABLET BY MOUTH TWICE A DAY  . ipratropium-albuterol (DUONEB) 0.5-2.5 (3) MG/3ML SOLN USE 1 VIAL IN NEBILZER 2 TIMES A DAY AS NEEDED**DX J44.9  . LIVALO 2 MG TABS Take 1 tablet by mouth every evening.   . montelukast (SINGULAIR) 10 MG tablet TAKE 1 TABLET (10 MG TOTAL) BY MOUTH AT BEDTIME.  . nortriptyline (PAMELOR) 25 MG capsule TAKE 2 CAPSULES BY MOUTH AT BEDTIME  . Omega 3 1200 MG CAPS Take 1,200 mg by mouth every morning.   . ondansetron (ZOFRAN) 8 MG tablet Take 8 mg by mouth as directed.  . OXYGEN-HELIUM IN Inhale 3 L into the lungs See admin  instructions. Uses when needed during the day, uses continuous throughout the night  . pantoprazole (PROTONIX) 40 MG tablet TAKE 1 TABLET BY MOUTH ONCE A DAY  . potassium chloride SA (KLOR-CON M20) 20 MEQ tablet Take 1 tablet (20 mEq total) by mouth daily.  . SYMBICORT 160-4.5 MCG/ACT inhaler INHALE 2 PUFFS INTO THE LUNGS 2 (TWO) TIMES DAILY.  . VENTOLIN HFA 108 (90 Base) MCG/ACT inhaler INHALE 2 PUFFS INTO THE LUNGS EVERY 4 HOURS AS NEEDED  . vitamin C (ASCORBIC ACID) 500 MG tablet Take 500 mg by mouth daily.  . [  DISCONTINUED] Multiple Vitamins-Minerals (MULTIVITAMIN WITH MINERALS) tablet Take 1 tablet by mouth every morning.    No facility-administered encounter medications on file as of 12/21/2016.     Activities of Daily Living In your present state of health, do you have any difficulty performing the following activities: 12/21/2016 02/12/2016  Hearing? N N  Vision? N N  Difficulty concentrating or making decisions? N N  Walking or climbing stairs? N N  Dressing or bathing? N N  Doing errands, shopping? N N  Preparing Food and eating ? N -  Using the Toilet? N -  In the past six months, have you accidently leaked urine? N -  Do you have problems with loss of bowel control? N -  Managing your Medications? N -  Managing your Finances? N -  Housekeeping or managing your Housekeeping? N -  Some recent data might be hidden    Patient Care Team: [name], MD as PCP - General (Internal Medicine) Pixie Casino, MD as Consulting Physician (Cardiology) Juanito Doom, MD as Consulting Physician (Pulmonary Disease)    Assessment:    Physical assessment deferred to PCP.  Exercise Activities and Dietary recommendations Current Exercise Habits: Home exercise routine, Type of exercise: walking;Other - see comments (gardening), Time (Minutes): 30, Frequency (Times/Week): 4, Weekly Exercise (Minutes/Week): 120, Intensity: Mild, Exercise limited by: respiratory conditions(s)  Diet  (meal preparation, eat out, water intake, caffeinated beverages, dairy products, fruits and vegetables): Patient reports  in general, a "healthy" diet  , well balanced diet. She eats a variety of fruits and vegetables daily, states she tries to limit fat intake and drinks approximately 4-5 16 ounce bottles of water per day. Drinks sugar drinks and caffeine in moderation.   Goals    . Live each day to its fullest, appreciate what I have      Fall Risk Fall Risk  12/21/2016 10/17/2015 10/16/2015  Falls in the past year? No No No   Depression Screen PHQ 2/9 Scores 12/21/2016 10/17/2015 10/16/2015  PHQ - 2 Score 0 0 0     Cognitive Function        Ad8 score reviewed for issues:  Issues making decisions: no  Less interest in hobbies / activities: no  Repeats questions, stories (family complaining): no  Trouble using ordinary gadgets (microwave, computer, phone): no  Forgets the month or year: no  Mismanaging finances: no  Remembering appts: no  Daily problems with thinking and/or memory: no Ad8 score is= 0     Immunization History  Administered Date(s) Administered  . Influenza, High Dose Seasonal PF 06/19/2015  . Influenza,inj,Quad PF,36+ Mos 06/22/2013, 07/09/2014  . Influenza-Unspecified 07/12/2016  . Pneumococcal Conjugate-13 10/03/2013  . Pneumococcal Polysaccharide-23 04/10/2014  . Tdap 03/04/2014   Screening Tests Health Maintenance  Topic Date Due  . MAMMOGRAM  12/21/2017  . COLONOSCOPY  05/15/2018  . TETANUS/TDAP  03/04/2024  . INFLUENZA VACCINE  Completed  . DEXA SCAN  Completed  . Hepatitis C Screening  Completed  . PNA vac Low Risk Adult  Completed      Plan:     Continue doing brain stimulating activities (puzzles, reading, adult coloring books, staying active) to keep memory sharp.   Continue to eat heart healthy diet (full of fruits, vegetables, whole grains, lean protein, water--limit salt, fat, and sugar intake) and increase physical activity  as tolerated.  During the course of the visit the patient was educated and counseled about the following appropriate screening and preventive services:  Vaccines to include Pneumoccal, Influenza, Hepatitis B, Td, Zostavax, HCV  Cardiovascular Disease  Colorectal cancer screening  Bone density screening  Diabetes screening  Glaucoma screening  Mammography/PAP  Patient Instructions (the written plan) was given to the patient.   Michiel Cowboy, RN  12/21/2016

## 2016-12-21 NOTE — Progress Notes (Signed)
Pre visit review using our clinic review tool, if applicable. No additional management support is needed unless otherwise documented below in the visit note. 

## 2016-12-21 NOTE — Patient Instructions (Addendum)
Continue doing brain stimulating activities (puzzles, reading, adult coloring books, staying active) to keep memory sharp.   Continue to eat heart healthy diet (full of fruits, vegetables, whole grains, lean protein, water--limit salt, fat, and sugar intake) and increase physical activity as tolerated.  [name] , Thank you for taking time to come for your Medicare Wellness Visit. I appreciate your ongoing commitment to your health goals. Please review the following plan we discussed and let me know if I can assist you in the future.   These are the goals we discussed: Goals    . Live each day to its fullest, appreciate what I have       This is a list of the screening recommended for you and due dates:  Health Maintenance  Topic Date Due  . Mammogram  12/21/2017  . Colon Cancer Screening  05/15/2018  . Tetanus Vaccine  03/04/2024  . Flu Shot  Completed  . DEXA scan (bone density measurement)  Completed  .  Hepatitis C: One time screening is recommended by Center for Disease Control  (CDC) for  adults born from [birth year] through 1965.   Completed  . Pneumonia vaccines  Completed     Anemia, Nonspecific Anemia is a condition in which the concentration of red blood cells or hemoglobin in the blood is below normal. Hemoglobin is a substance in red blood cells that carries oxygen to the tissues of the body. Anemia results in not enough oxygen reaching these tissues. What are the causes? Common causes of anemia include:  Excessive bleeding. Bleeding may be internal or external. This includes excessive bleeding from periods (in women) or from the intestine.  Poor nutrition.  Chronic kidney, thyroid, and liver disease.  Bone marrow disorders that decrease red blood cell production.  Cancer and treatments for cancer.  HIV, AIDS, and their treatments.  Spleen problems that increase red blood cell destruction.  Blood disorders.  Excess destruction of red blood cells due to  infection, medicines, and autoimmune disorders. What are the signs or symptoms?  Minor weakness.  Dizziness.  Headache.  Palpitations.  Shortness of breath, especially with exercise.  Paleness.  Cold sensitivity.  Indigestion.  Nausea.  Difficulty sleeping.  Difficulty concentrating. Symptoms may occur suddenly or they may develop slowly. How is this diagnosed? Additional blood tests are often needed. These help your health care provider determine the best treatment. Your health care provider will check your stool for blood and look for other causes of blood loss. How is this treated? Treatment varies depending on the cause of the anemia. Treatment can include:  Supplements of iron, vitamin U44, or folic acid.  Hormone medicines.  A blood transfusion. This may be needed if blood loss is severe.  Hospitalization. This may be needed if there is significant continual blood loss.  Dietary changes.  Spleen removal. Follow these instructions at home: Keep all follow-up appointments. It often takes many weeks to correct anemia, and having your health care provider check on your condition and your response to treatment is very important. Get help right away if:  You develop extreme weakness, shortness of breath, or chest pain.  You become dizzy or have trouble concentrating.  You develop heavy vaginal bleeding.  You develop a rash.  You have bloody or black, tarry stools.  You faint.  You vomit up blood.  You vomit repeatedly.  You have abdominal pain.  You have a fever or persistent symptoms for more than 2-3 days.  You have  a fever and your symptoms suddenly get worse.  You are dehydrated. This information is not intended to replace advice given to you by your health care provider. Make sure you discuss any questions you have with your health care provider. Document Released: 10/21/2004 Document Revised: 02/25/2016 Document Reviewed: 03/09/2013 Elsevier  Interactive Patient Education  2017 [employer].

## 2016-12-22 LAB — VITAMIN B12: Vitamin B-12: 476 pg/mL (ref 211–911)

## 2016-12-22 LAB — THYROID PANEL WITH TSH
Free Thyroxine Index: 2.3 (ref 1.4–3.8)
T3 Uptake: 28 % (ref 22–35)
T4, Total: 8.3 ug/dL (ref 4.5–12.0)
TSH: 1.17 mIU/L

## 2016-12-22 LAB — FOLATE: Folate: >23.5 ng/mL (ref >5.9)

## 2016-12-22 MED ORDER — FERIVA 21/7 75-1 MG PO TABS: 1.0000 | ORAL_TABLET | Freq: Every day | ORAL | 5 refills | Status: DC

## 2016-12-27 ENCOUNTER — Telehealth: Payer: Self-pay | Admitting: Internal Medicine

## 2016-12-29 ENCOUNTER — Other Ambulatory Visit: Payer: Self-pay | Admitting: Internal Medicine

## 2016-12-29 DIAGNOSIS — D508 Other iron deficiency anemias: Secondary | ICD-10-CM

## 2016-12-29 MED ORDER — FERROUS SULFATE 325 (65 FE) MG PO TABS: 325.0000 mg | ORAL_TABLET | Freq: Two times a day (BID) | ORAL | 1 refills | Status: DC

## 2016-12-31 ENCOUNTER — Other Ambulatory Visit: Payer: Self-pay | Admitting: Internal Medicine

## 2017-01-13 ENCOUNTER — Other Ambulatory Visit: Payer: Self-pay | Admitting: Pulmonary Disease

## 2017-01-13 ENCOUNTER — Telehealth: Payer: Self-pay

## 2017-01-13 ENCOUNTER — Other Ambulatory Visit: Payer: Self-pay | Admitting: Internal Medicine

## 2017-01-13 NOTE — Telephone Encounter (Signed)
PA for Nebulizer.

## 2017-01-13 NOTE — Telephone Encounter (Signed)
rx faxed to [facility].  

## 2017-01-13 NOTE — Telephone Encounter (Signed)
Pt contacted and needs a nebulizer machine and the PA is for the ipatropium-albuterol

## 2017-01-13 NOTE — Telephone Encounter (Signed)
Found a handwritten note to do a PA on ferriva I only see the ferrous sulfate on pt rx list and LVM for pt to call back as soon as possible.     RE: need to confirm what medication needs a PA.

## 2017-01-19 NOTE — Telephone Encounter (Signed)
PA started via cover my meds. Key: [id number]

## 2017-01-21 ENCOUNTER — Other Ambulatory Visit: Payer: Self-pay | Admitting: Internal Medicine

## 2017-01-21 DIAGNOSIS — Z1231 Encounter for screening mammogram for malignant neoplasm of breast: Secondary | ICD-10-CM

## 2017-01-21 NOTE — Telephone Encounter (Signed)
Called [employer] and she has to go through her medicare care to receive ipatropium-albuterol.        [phone] [id number]

## 2017-01-21 NOTE — Telephone Encounter (Signed)
Pt called back. Please call back. °

## 2017-01-21 NOTE — Telephone Encounter (Signed)
PA not able to processed due to "non coverage" .   LVM for pt to call back as soon as possible.

## 2017-01-25 NOTE — Telephone Encounter (Signed)
Patient states she is now using [place] drug coverage.  States ID number is [id number] RX BIN [id number] RX [facility] Provider phone number [phone]. States no longer uses [place].   Would like albuterol sent to [facility].  States does not see Dr. [place] Bells much any more and would like [facility] to fill this medication going forward.  Patient requesting follow up call in regard.

## 2017-01-26 ENCOUNTER — Other Ambulatory Visit: Payer: Self-pay | Admitting: Internal Medicine

## 2017-01-26 NOTE — Telephone Encounter (Signed)
Can we get her a nebulizer (one from [facility] or place and order for one?)

## 2017-01-26 NOTE — Telephone Encounter (Signed)
Submitted via cover my meds   (Key: [id number])

## 2017-01-27 ENCOUNTER — Other Ambulatory Visit: Payer: Self-pay | Admitting: *Deleted

## 2017-01-27 MED ORDER — NORTRIPTYLINE HCL 25 MG PO CAPS: 50.0000 mg | ORAL_CAPSULE | Freq: Every day | ORAL | 1 refills | Status: DC

## 2017-01-28 ENCOUNTER — Ambulatory Visit (INDEPENDENT_AMBULATORY_CARE_PROVIDER_SITE_OTHER): Payer: Medicare Other | Admitting: Adult Health

## 2017-01-28 ENCOUNTER — Encounter: Payer: Self-pay | Admitting: Adult Health

## 2017-01-28 VITALS — BP 104/64 | Temp 97.5°F | Ht 62.0 in | Wt 124.3 lb

## 2017-01-28 DIAGNOSIS — J0100 Acute maxillary sinusitis, unspecified: Secondary | ICD-10-CM | POA: Diagnosis not present

## 2017-01-28 DIAGNOSIS — I2510 Atherosclerotic heart disease of native coronary artery without angina pectoris: Secondary | ICD-10-CM

## 2017-01-28 MED ORDER — DOXYCYCLINE HYCLATE 100 MG PO CAPS
100.0000 mg | ORAL_CAPSULE | Freq: Two times a day (BID) | ORAL | 0 refills | Status: DC
Start: 2017-01-28 — End: 2017-02-02

## 2017-01-28 NOTE — Telephone Encounter (Signed)
Daughter has called back in regard.  States mother needs meds for this weekend b/c of her COPD.  Also states that patient had a sinus flare up.  Did schedule patient to be seen at [facility] today for the sinus issue.  Daughter did states she would try to pay out of pocket until PA is fully processed.

## 2017-01-28 NOTE — Progress Notes (Signed)
Subjective:    Patient ID: [name], female    DOB: June 29, 1946, 71 y.o.   MRN: [id number]  URI   This is a new problem. The current episode started 1 to 4 weeks ago (10 days ). There has been no fever. Associated symptoms include coughing (dry ), headaches, rhinorrhea and sinus pain. Pertinent negatives include no ear pain, nausea, neck pain or sore throat. Treatments tried: Flonase and Singular  The treatment provided no relief.      Review of Systems  Constitutional: Negative.   HENT: Positive for rhinorrhea, sinus pain and sinus pressure. Negative for ear discharge, ear pain, sore throat, tinnitus and trouble swallowing.   Respiratory: Positive for cough (dry ) and shortness of breath (chronic ).   Gastrointestinal: Negative for nausea.  Musculoskeletal: Negative for neck pain.  Neurological: Positive for headaches.  All other systems reviewed and are negative.  Past Medical History:  Diagnosis Date  . Abnormal LFTs 08/02/2011  . Acute liver failure 06/19/2013  . Acute renal failure ([place]) 06/19/2013  . Acute respiratory failure ([place]) 06/19/2013  . Altered mental status 08/01/2011  . Angina   . Anxiety   . Anxiety state, unspecified 12/03/2013  . Aortic stenosis    mild  . Breast cancer of upper-outer quadrant of left female breast ([place]) 08/21/2013  . CAD (coronary artery disease) of artery bypass graft 11/06/2013  . CAD (coronary artery disease), MI R/O 08/01/2011   PCI in 2006 (bare metal stent, unknown artery) - [place]   . Cancer of breast ([place]) dx'd 2008   RT Breast  . CHF,  acute diastolic, BNP 4k on admissio 08/01/2011  . Chronic respiratory failure ([place]) 02/02/2012  . Compression fracture of L1 lumbar vertebra ([place]) 02/02/2012  . COPD (chronic obstructive pulmonary disease) (HCC)    oxygen-dependent 4LPM [place]  . Coronary artery disease 2006   2 stents w/previous MI  . Depressed   . Diastolic CHF ([place])   . Dyslipidemia   . Heart murmur   . History of bowel infarction  06/23/2013  . History of breast cancer in female 06/23/2013  . History of nuclear stress test 08/03/2011   attenuation at apex - no perfusion defects   . HTN (hypertension) 11/06/2013  . Hyperkalemia, on ACE prior to admission 11/11/2011  . Hypertension   . Hyponatremia 01/31/2012  . Migraine   . Mild aortic stenosis 08/01/2011   AVA 1.69 cm2 (06/02/2011)   . Moderate to severe pulmonary hypertension ([place])   . Myocardial infarction [facility]) 2006   NSTEMI  . NSVT (nonsustained ventricular tachycardia) (HCC)    h/o    Social History   Social History  . Marital status: Widowed    Spouse name: N/A  . Number of children: 3  . Years of education: 12   Occupational History  . Not on file.   Social History Main Topics  . Smoking status: Current Some Day Smoker    Packs/day: 0.25    Years: 47.00    Types: Cigarettes  . Smokeless tobacco: Never Used     Comment: smokes when anxious, uses eCigs as well.   . Alcohol use No     Comment: wine with dinner occasionally  . Drug use: No  . Sexual activity: Not Currently     Comment: 3 cig a day   Other Topics Concern  . Not on file   Social History Narrative  . No narrative on file    Past Surgical History:  Procedure Laterality Date  . BREAST LUMPECTOMY Right 2008  . CARDIAC CATHETERIZATION  2006  . CHOLECYSTECTOMY  2011  . ERCP N/A 09/05/2015   Procedure: ENDOSCOPIC RETROGRADE CHOLANGIOPANCREATOGRAPHY (ERCP);  Surgeon: Ladene Artist, MD;  Location: [facility];  Service: Endoscopy;  Laterality: N/A;  . PARTIAL COLECTOMY  2009   for obstruction: temporary ostomy, later reversed.   Marland Kitchen RIGHT HEART CATHETERIZATION N/A 09/05/2013   Procedure: RIGHT HEART CATH;  Surgeon: Jolaine Artist, MD;  Location: [facility] CATH LAB;  Service: Cardiovascular;  Laterality: N/A;  . TRANSTHORACIC ECHOCARDIOGRAM  11/12/2011   EF 16-10%, normal systolic function, grade 1 diastolic dysfunction; ventricular septal flattening (D-sign); mild AS; trace-mild MR; LA  mildly dilated; RV mod dilated; RA mod dilated; severe pulm HTN; elevated CVP    Family History  Problem Relation Age of Onset  . Schizophrenia Sister   . Alzheimer's disease Mother   . Hyperlipidemia Brother   . Heart disease Sister   . Diabetes Sister   . Cancer Neg Hx   . Stroke Neg Hx   . COPD Neg Hx   . Depression Neg Hx   . Drug abuse Neg Hx   . Early death Neg Hx   . Hypertension Neg Hx   . Kidney disease Neg Hx     Allergies  Allergen Reactions  . Ceftriaxone Anaphylaxis and Other (See Comments)    *ROCEPHIN*  "Blow up like a balloon"  . Hydroxyzine Shortness Of Breath and Other (See Comments)    Pt states med make her light headed, get sob sxs  . Lexapro [Escitalopram] Other (See Comments)    Pt states med make her dizzy    Current Outpatient Prescriptions on File Prior to Visit  Medication Sig Dispense Refill  . ALPRAZolam (XANAX) 1 MG tablet TAKE 1 TABLET BY MOUTH TWICE A DAY AS NEEDED 60 tablet 2  . Artificial Tear Ointment (DRY EYES OP) Apply 2 drops to eye daily as needed.    Marland Kitchen aspirin 325 MG EC tablet Take 1 tablet (325 mg total) by mouth every morning.    . ATROVENT HFA 17 MCG/ACT inhaler USE 2 PUFFS EVERY FOUR HOURS AS NEEDED 77.4 Inhaler 11  . butalbital-acetaminophen-caffeine (FIORICET, ESGIC) 50-325-40 MG tablet TAKE 1 TO 2 TABLETS BY MOUTH AS NEEDED FOR HEADACHE 60 tablet 5  . cholecalciferol (VITAMIN D) 1000 units tablet Take 1,000 Units by mouth daily.    . diclofenac sodium (VOLTAREN) 1 % GEL APPLY 4 G TOPICALLY DAILY AS NEEDED (APPLY TO HANDS). HAND PAIN  2  . ferrous sulfate 325 (65 FE) MG tablet Take 1 tablet (325 mg total) by mouth 2 (two) times daily with a meal. 180 tablet 1  . fluticasone (FLONASE) 50 MCG/ACT nasal spray USE 2 SPRAYS INTO EACH NOSTRIL ONCE DAILY**REPEAT FOR 5 DAYS THEN STOP 16 g 11  . furosemide (LASIX) 40 MG tablet TAKE 1 TABLET BY MOUTH TWICE A DAY 180 tablet 1  . ipratropium-albuterol (DUONEB) 0.5-2.5 (3) MG/3ML SOLN USE 1  VIAL IN NEBILZER 2 TIMES A DAY AS NEEDED**DX J44.9 180 mL 1  . KLOR-CON M20 20 MEQ tablet TAKE 1 TABLET BY MOUTH EVERY DAY 30 tablet 5  . LIVALO 2 MG TABS Take 1 tablet by mouth every evening.   3  . montelukast (SINGULAIR) 10 MG tablet TAKE 1 TABLET (10 MG TOTAL) BY MOUTH AT BEDTIME. 30 tablet 11  . nortriptyline (PAMELOR) 25 MG capsule Take 2 capsules (50 mg total) by mouth at bedtime.  180 capsule 1  . Omega 3 1200 MG CAPS Take 1,200 mg by mouth every morning.     . ondansetron (ZOFRAN) 8 MG tablet Take 8 mg by mouth as directed.    . OXYGEN-HELIUM IN Inhale 3 L into the lungs See admin instructions. Uses when needed during the day, uses continuous throughout the night    . pantoprazole (PROTONIX) 40 MG tablet TAKE 1 TABLET BY MOUTH ONCE A DAY 90 tablet 3  . SYMBICORT 160-4.5 MCG/ACT inhaler INHALE 2 PUFFS INTO THE LUNGS 2 (TWO) TIMES DAILY. 10.2 Inhaler 11  . VENTOLIN HFA 108 (90 Base) MCG/ACT inhaler INHALE 2 PUFFS INTO THE LUNGS EVERY 4 HOURS AS NEEDED 36 Inhaler 5  . vitamin C (ASCORBIC ACID) 500 MG tablet Take 500 mg by mouth daily.     No current facility-administered medications on file prior to visit.     BP 104/64 (BP Location: Left Arm, Patient Position: Sitting, Cuff Size: Normal)   Temp 97.5 F (36.4 C) (Oral)   Ht 5\' 2"  (1.575 m)   Wt 124 lb 4.8 oz (56.4 kg)   BMI 22.73 kg/m       Objective:   Physical Exam  Constitutional: She appears well-developed and well-nourished. No distress.  HENT:  Right Ear: Hearing, tympanic membrane, external ear and ear canal normal.  Left Ear: Hearing, tympanic membrane, external ear and ear canal normal.  Nose: Mucosal edema and rhinorrhea present. Right sinus exhibits maxillary sinus tenderness. Right sinus exhibits no frontal sinus tenderness. Left sinus exhibits maxillary sinus tenderness. Left sinus exhibits no frontal sinus tenderness.  Mouth/Throat: Uvula is midline. Posterior oropharyngeal erythema (trace) present.    Cardiovascular: Normal rate, regular rhythm, normal heart sounds and intact distal pulses.  Exam reveals no gallop and no friction rub.   No murmur heard. Pulmonary/Chest: Effort normal. She has wheezes. She has no rales. She exhibits no tenderness.  Skin: She is not diaphoretic.  Vitals reviewed.     Assessment & Plan:  1. Acute maxillary sinusitis, recurrence not specified - Exam consistent with sinusitis. Will treat with doxycycline.  - doxycycline (VIBRAMYCIN) 100 MG capsule; Take 1 capsule (100 mg total) by mouth 2 (two) times daily.  Dispense: 14 capsule; Refill: 0 - Continue with flonase - Follow up with PCP if no improvement in the next 2-3 days   Dorothyann Peng, NP

## 2017-01-31 NOTE — Telephone Encounter (Signed)
PA denied.  Will call to do an appeal.  Called twice. During transfer the call is dropped. Will try this afternoon after lunch.

## 2017-01-31 NOTE — Telephone Encounter (Signed)
Left detailed message with pt explaining the same: issue with appealing PA.   I will try again after lunch.

## 2017-01-31 NOTE — Telephone Encounter (Signed)
Resent PA. [place]

## 2017-02-01 ENCOUNTER — Telehealth: Payer: Self-pay | Admitting: Internal Medicine

## 2017-02-01 ENCOUNTER — Other Ambulatory Visit: Payer: Self-pay | Admitting: Internal Medicine

## 2017-02-01 NOTE — Telephone Encounter (Signed)
They will only accept appeal through fax or mail.   Completing form.

## 2017-02-01 NOTE — Telephone Encounter (Signed)
Pt left msg on triage stating the antibiotic that she was given for sinusitis " doxycycline" is not working for her med is actually make her feel worse. Very fatigue, tired. and headaches so she has stop taking due to side effects. Requesting MD to rx something else...Johny Chess

## 2017-02-01 NOTE — Telephone Encounter (Signed)
This does not sound like a sinus infection

## 2017-02-02 ENCOUNTER — Ambulatory Visit (INDEPENDENT_AMBULATORY_CARE_PROVIDER_SITE_OTHER): Payer: Medicare Other | Admitting: Internal Medicine

## 2017-02-02 ENCOUNTER — Encounter: Payer: Self-pay | Admitting: Internal Medicine

## 2017-02-02 VITALS — BP 100/60 | HR 78 | Temp 97.7°F | Resp 20 | Ht 62.0 in | Wt 124.8 lb

## 2017-02-02 DIAGNOSIS — J301 Allergic rhinitis due to pollen: Secondary | ICD-10-CM | POA: Diagnosis not present

## 2017-02-02 DIAGNOSIS — J438 Other emphysema: Secondary | ICD-10-CM

## 2017-02-02 DIAGNOSIS — I2510 Atherosclerotic heart disease of native coronary artery without angina pectoris: Secondary | ICD-10-CM

## 2017-02-02 MED ORDER — MONTELUKAST SODIUM 10 MG PO TABS: 10.0000 mg | ORAL_TABLET | Freq: Every day | ORAL | 11 refills | Status: DC

## 2017-02-02 MED ORDER — IPRATROPIUM-ALBUTEROL 0.5-2.5 (3) MG/3ML IN SOLN: RESPIRATORY_TRACT | 1 refills | Status: DC

## 2017-02-02 MED ORDER — METHYLPREDNISOLONE 4 MG PO TBPK: ORAL_TABLET | ORAL | 0 refills | Status: DC

## 2017-02-02 MED ORDER — LEVOCETIRIZINE DIHYDROCHLORIDE 5 MG PO TABS: 5.0000 mg | ORAL_TABLET | Freq: Every evening | ORAL | 3 refills | Status: DC

## 2017-02-02 NOTE — Patient Instructions (Signed)
Allergic Rhinitis Allergic rhinitis is when the mucous membranes in the nose respond to allergens. Allergens are particles in the air that cause your body to have an allergic reaction. This causes you to release allergic antibodies. Through a chain of events, these eventually cause you to release histamine into the blood stream. Although meant to protect the body, it is this release of histamine that causes your discomfort, such as frequent sneezing, congestion, and an itchy, runny nose. What are the causes? Seasonal allergic rhinitis (hay fever) is caused by pollen allergens that may come from grasses, trees, and weeds. Year-round allergic rhinitis (perennial allergic rhinitis) is caused by allergens such as house dust mites, pet dander, and mold spores. What are the signs or symptoms?  Nasal stuffiness (congestion).  Itchy, runny nose with sneezing and tearing of the eyes. How is this diagnosed? Your health care provider can help you determine the allergen or allergens that trigger your symptoms. If you and your health care provider are unable to determine the allergen, skin or blood testing may be used. Your health care provider will diagnose your condition after taking your health history and performing a physical exam. Your health care provider may assess you for other related conditions, such as asthma, pink eye, or an ear infection. How is this treated? Allergic rhinitis does not have a cure, but it can be controlled by:  Medicines that block allergy symptoms. These may include allergy shots, nasal sprays, and oral antihistamines.  Avoiding the allergen. Hay fever may often be treated with antihistamines in pill or nasal spray forms. Antihistamines block the effects of histamine. There are over-the-counter medicines that may help with nasal congestion and swelling around the eyes. Check with your health care provider before taking or giving this medicine. If avoiding the allergen or the  medicine prescribed do not work, there are many new medicines your health care provider can prescribe. Stronger medicine may be used if initial measures are ineffective. Desensitizing injections can be used if medicine and avoidance does not work. Desensitization is when a patient is given ongoing shots until the body becomes less sensitive to the allergen. Make sure you follow up with your health care provider if problems continue. Follow these instructions at home: It is not possible to completely avoid allergens, but you can reduce your symptoms by taking steps to limit your exposure to them. It helps to know exactly what you are allergic to so that you can avoid your specific triggers. Contact a health care provider if:  You have a fever.  You develop a cough that does not stop easily (persistent).  You have shortness of breath.  You start wheezing.  Symptoms interfere with normal daily activities. This information is not intended to replace advice given to you by your health care provider. Make sure you discuss any questions you have with your health care provider. Document Released: 06/08/2001 Document Revised: 05/14/2016 Document Reviewed: 05/21/2013 Elsevier Interactive Patient Education  2017 Elsevier Inc.  

## 2017-02-02 NOTE — Progress Notes (Signed)
Subjective:  Patient ID: [name], female    DOB: [birth year]  Age: 71 y.o. MRN: [id number]  CC: Allergic Rhinitis    HPI [name] presents for the complaint of nasal congestion, postnasal drip, and runny nose. She thought she had a sinus infection so she was seen out point start a doxycycline but that caused her to have nausea, dry heaves, and a headache. She denies facial pain, sore throat, fever, chills. She has tried Flonase for her symptoms without much relief.  Outpatient Medications Prior to Visit  Medication Sig Dispense Refill  . ALPRAZolam (XANAX) 1 MG tablet TAKE 1 TABLET BY MOUTH TWICE A DAY AS NEEDED 60 tablet 2  . Artificial Tear Ointment (DRY EYES OP) Apply 2 drops to eye daily as needed.    Marland Kitchen aspirin 325 MG EC tablet Take 1 tablet (325 mg total) by mouth every morning.    . ATROVENT HFA 17 MCG/ACT inhaler USE 2 PUFFS EVERY FOUR HOURS AS NEEDED 77.4 Inhaler 11  . butalbital-acetaminophen-caffeine (FIORICET, ESGIC) 50-325-40 MG tablet TAKE 1 TO 2 TABLETS BY MOUTH AS NEEDED FOR HEADACHE 60 tablet 5  . cholecalciferol (VITAMIN D) 1000 units tablet Take 1,000 Units by mouth daily.    . diclofenac sodium (VOLTAREN) 1 % GEL APPLY 4 G TOPICALLY DAILY AS NEEDED (APPLY TO HANDS). HAND PAIN  2  . ferrous sulfate 325 (65 FE) MG tablet Take 1 tablet (325 mg total) by mouth 2 (two) times daily with a meal. 180 tablet 1  . fluticasone (FLONASE) 50 MCG/ACT nasal spray USE 2 SPRAYS INTO EACH NOSTRIL ONCE DAILY**REPEAT FOR 5 DAYS THEN STOP 16 g 11  . furosemide (LASIX) 40 MG tablet TAKE 1 TABLET BY MOUTH TWICE A DAY 180 tablet 1  . KLOR-CON M20 20 MEQ tablet TAKE 1 TABLET BY MOUTH EVERY DAY 30 tablet 5  . LIVALO 2 MG TABS Take 1 tablet by mouth every evening.   3  . nortriptyline (PAMELOR) 25 MG capsule Take 2 capsules (50 mg total) by mouth at bedtime. 180 capsule 1  . Omega 3 1200 MG CAPS Take 1,200 mg by mouth every morning.     . ondansetron (ZOFRAN) 8 MG tablet Take 8 mg by  mouth as directed.    . ondansetron (ZOFRAN) 8 MG tablet TAKE 1 TABLET BY MOUTH TWICE A DAY AS NEEDED FOR NAUSEA OR VOMITING 30 tablet 0  . OXYGEN-HELIUM IN Inhale 3 L into the lungs See admin instructions. Uses when needed during the day, uses continuous throughout the night    . pantoprazole (PROTONIX) 40 MG tablet TAKE 1 TABLET BY MOUTH ONCE A DAY 90 tablet 3  . SYMBICORT 160-4.5 MCG/ACT inhaler INHALE 2 PUFFS INTO THE LUNGS 2 (TWO) TIMES DAILY. 10.2 Inhaler 11  . VENTOLIN HFA 108 (90 Base) MCG/ACT inhaler INHALE 2 PUFFS INTO THE LUNGS EVERY 4 HOURS AS NEEDED 36 Inhaler 5  . vitamin C (ASCORBIC ACID) 500 MG tablet Take 500 mg by mouth daily.    Marland Kitchen ipratropium-albuterol (DUONEB) 0.5-2.5 (3) MG/3ML SOLN USE 1 VIAL IN NEBILZER 2 TIMES A DAY AS NEEDED**DX J44.9 180 mL 1  . montelukast (SINGULAIR) 10 MG tablet TAKE 1 TABLET (10 MG TOTAL) BY MOUTH AT BEDTIME. 30 tablet 11  . doxycycline (VIBRAMYCIN) 100 MG capsule Take 1 capsule (100 mg total) by mouth 2 (two) times daily. 14 capsule 0   No facility-administered medications prior to visit.     ROS Review of Systems  Constitutional: Negative for appetite change, chills, diaphoresis, fatigue and fever.  HENT: Positive for congestion, postnasal drip and rhinorrhea. Negative for facial swelling, hearing loss, nosebleeds, sinus pain, sinus pressure, sneezing, sore throat, tinnitus, trouble swallowing and voice change.   Eyes: Negative.  Negative for visual disturbance.  Respiratory: Positive for shortness of breath and wheezing. Negative for cough and chest tightness.   Cardiovascular: Negative.  Negative for chest pain, palpitations and leg swelling.  Gastrointestinal: Positive for nausea. Negative for abdominal pain, constipation, diarrhea and vomiting.  Endocrine: Negative.   Genitourinary: Negative.  Negative for difficulty urinating.  Musculoskeletal: Negative.  Negative for back pain and myalgias.  Skin: Negative.   Allergic/Immunologic:  Negative.   Neurological: Positive for headaches. Negative for dizziness, weakness and numbness.  Hematological: Negative.  Negative for adenopathy. Does not bruise/bleed easily.  Psychiatric/Behavioral: Negative.     Objective:  BP 100/60 (BP Location: Left Arm, Patient Position: Sitting, Cuff Size: Normal)   Pulse 78   Temp 97.7 F (36.5 C) (Oral)   Resp 20   Ht 5\' 2"  (1.575 m)   Wt 124 lb 12 oz (56.6 kg)   SpO2 93%   BMI 22.82 kg/m   BP Readings from Last 3 Encounters:  02/02/17 100/60  01/28/17 104/64  12/21/16 140/72    Wt Readings from Last 3 Encounters:  02/02/17 124 lb 12 oz (56.6 kg)  01/28/17 124 lb 4.8 oz (56.4 kg)  12/21/16 124 lb 6 oz (56.4 kg)    Physical Exam  Constitutional:  Non-toxic appearance. She does not have a sickly appearance. She appears ill. No distress.  HENT:  Nose: Mucosal edema present. No rhinorrhea or sinus tenderness. No epistaxis. Right sinus exhibits no maxillary sinus tenderness and no frontal sinus tenderness. Left sinus exhibits no maxillary sinus tenderness and no frontal sinus tenderness.  Mouth/Throat: Oropharynx is clear and moist and mucous membranes are normal. Mucous membranes are not pale, not dry and not cyanotic. No oral lesions. No trismus in the jaw. No uvula swelling. No oropharyngeal exudate, posterior oropharyngeal edema, posterior oropharyngeal erythema or tonsillar abscesses.  Eyes: Conjunctivae are normal. Right eye exhibits no discharge. Left eye exhibits no discharge. No scleral icterus.  Neck: Normal range of motion. Neck supple. No JVD present. No tracheal deviation present. No thyromegaly present.  Pulmonary/Chest: No accessory muscle usage or stridor. Tachypnea noted. No respiratory distress. She has decreased breath sounds. She has wheezes in the right upper field, the right middle field, the right lower field, the left upper field, the left middle field and the left lower field. She has rhonchi in the right middle  field and the left middle field. She has no rales.  She has mild expiratory rhonchi and inspiratory wheeze but good air movement and no rales or decreased breath sounds  Lymphadenopathy:    She has no cervical adenopathy.  Skin: She is not diaphoretic.  Vitals reviewed.   Lab Results  Component Value Date   WBC 7.6 12/21/2016   HGB 12.7 12/21/2016   HCT 38.3 12/21/2016   PLT 329.0 12/21/2016   GLUCOSE 84 12/21/2016   CHOL 186 12/21/2016   TRIG 85.0 12/21/2016   HDL 72.70 12/21/2016   LDLCALC 97 12/21/2016   ALT 21 12/21/2016   AST 25 12/21/2016   NA 139 12/21/2016   K 3.7 12/21/2016   CL 98 12/21/2016   CREATININE 0.94 12/21/2016   BUN 10 12/21/2016   CO2 33 (H) 12/21/2016   TSH 1.17 12/21/2016  INR 0.87 09/05/2013   HGBA1C 5.3 10/16/2015    Dg Chest 2 View  Result Date: 03/03/2016 CLINICAL DATA:  Followup of pneumonia, former smoking history EXAM: CHEST  2 VIEW COMPARISON:  Chest x-ray of 02/12/2016 FINDINGS: The left upper lobe and lingular pneumonia has largely cleared with minimally prominent markings remaining anteriorly on the lateral view in the region of the upper lobe on the left. No pleural effusion is seen. Mediastinal and hilar contours are unremarkable and the heart is mildly enlarged and stable. No acute bony abnormality is seen. Surgical clips overlie the right axilla. IMPRESSION: Clearing of lingular pneumonia. Minimally prominent markings remain in the left upper lobe anteriorly possibly chronic in nature. Electronically Signed   By: Ivar Drape M.D.   On: 03/03/2016 15:33    Assessment & Plan:   Shandrea was seen today for allergic rhinitis .  Diagnoses and all orders for this visit:  Seasonal allergic rhinitis due to pollen- I don't think she has a sinus infection, I have asked her to stop doxycycline and I have listed it as an allergy for her, will treat the allergic rhinitis with Singulair, Xyzal, and a Medrol Dosepak -     montelukast (SINGULAIR) 10 MG  tablet; Take 1 tablet (10 mg total) by mouth at bedtime. -     levocetirizine (XYZAL) 5 MG tablet; Take 1 tablet (5 mg total) by mouth every evening. -     methylPREDNISolone (MEDROL DOSEPAK) 4 MG TBPK tablet; TAKE AS DIRECTED  Other emphysema ([place])- she is having a mild flareup of her symptoms so will treat with a Medrol Dosepak and have asked her to start using Dulera now began. -     ipratropium-albuterol (DUONEB) 0.5-2.5 (3) MG/3ML SOLN; USE 1 VIAL IN NEBILZER 2 TIMES A DAY AS NEEDED**DX J44.9   I have discontinued Ms. Dunavant's doxycycline. I am also having her start on levocetirizine and methylPREDNISolone. Additionally, I am having her maintain her OXYGEN-HELIUM IN, Omega 3, aspirin, diclofenac sodium, LIVALO, cholecalciferol, Artificial Tear Ointment (DRY EYES OP), vitamin C, ondansetron, fluticasone, ATROVENT HFA, butalbital-acetaminophen-caffeine, pantoprazole, SYMBICORT, furosemide, VENTOLIN HFA, ferrous sulfate, KLOR-CON M20, ALPRAZolam, nortriptyline, ondansetron, ipratropium-albuterol, and montelukast.  Meds ordered this encounter  Medications  . ipratropium-albuterol (DUONEB) 0.5-2.5 (3) MG/3ML SOLN    Sig: USE 1 VIAL IN NEBILZER 2 TIMES A DAY AS NEEDED**DX J44.9    Dispense:  180 mL    Refill:  1    DX Code Needed  .  . montelukast (SINGULAIR) 10 MG tablet    Sig: Take 1 tablet (10 mg total) by mouth at bedtime.    Dispense:  30 tablet    Refill:  11  . levocetirizine (XYZAL) 5 MG tablet    Sig: Take 1 tablet (5 mg total) by mouth every evening.    Dispense:  90 tablet    Refill:  3  . methylPREDNISolone (MEDROL DOSEPAK) 4 MG TBPK tablet    Sig: TAKE AS DIRECTED    Dispense:  21 tablet    Refill:  0     Follow-up: Return in about 6 weeks (around 03/16/2017).  Scarlette Calico, MD

## 2017-02-02 NOTE — Telephone Encounter (Signed)
Faxed request for appeal

## 2017-02-03 ENCOUNTER — Inpatient Hospital Stay (HOSPITAL_COMMUNITY)
Admission: EM | Admit: 2017-02-03 | Discharge: 2017-02-06 | DRG: 193 | Disposition: A | Discharge status: Home / Self Care | Payer: Medicare Other | Attending: Internal Medicine | Admitting: Internal Medicine

## 2017-02-03 ENCOUNTER — Encounter (HOSPITAL_COMMUNITY): Payer: Self-pay | Admitting: *Deleted

## 2017-02-03 ENCOUNTER — Telehealth: Payer: Self-pay | Admitting: Internal Medicine

## 2017-02-03 ENCOUNTER — Emergency Department (HOSPITAL_COMMUNITY): Payer: Medicare Other

## 2017-02-03 DIAGNOSIS — Z9981 Dependence on supplemental oxygen: Secondary | ICD-10-CM

## 2017-02-03 DIAGNOSIS — J9621 Acute and chronic respiratory failure with hypoxia: Secondary | ICD-10-CM | POA: Diagnosis present

## 2017-02-03 DIAGNOSIS — J181 Lobar pneumonia, unspecified organism: Secondary | ICD-10-CM

## 2017-02-03 DIAGNOSIS — K219 Gastro-esophageal reflux disease without esophagitis: Secondary | ICD-10-CM | POA: Diagnosis present

## 2017-02-03 DIAGNOSIS — I2510 Atherosclerotic heart disease of native coronary artery without angina pectoris: Secondary | ICD-10-CM | POA: Diagnosis present

## 2017-02-03 DIAGNOSIS — I5032 Chronic diastolic (congestive) heart failure: Secondary | ICD-10-CM | POA: Diagnosis present

## 2017-02-03 DIAGNOSIS — Z9049 Acquired absence of other specified parts of digestive tract: Secondary | ICD-10-CM | POA: Diagnosis not present

## 2017-02-03 DIAGNOSIS — Z7951 Long term (current) use of inhaled steroids: Secondary | ICD-10-CM

## 2017-02-03 DIAGNOSIS — J309 Allergic rhinitis, unspecified: Secondary | ICD-10-CM | POA: Diagnosis present

## 2017-02-03 DIAGNOSIS — I252 Old myocardial infarction: Secondary | ICD-10-CM

## 2017-02-03 DIAGNOSIS — F329 Major depressive disorder, single episode, unspecified: Secondary | ICD-10-CM | POA: Diagnosis present

## 2017-02-03 DIAGNOSIS — I272 Pulmonary hypertension, unspecified: Secondary | ICD-10-CM | POA: Diagnosis present

## 2017-02-03 DIAGNOSIS — I350 Nonrheumatic aortic (valve) stenosis: Secondary | ICD-10-CM | POA: Diagnosis present

## 2017-02-03 DIAGNOSIS — I110 Hypertensive heart disease with heart failure: Secondary | ICD-10-CM | POA: Diagnosis present

## 2017-02-03 DIAGNOSIS — J440 Chronic obstructive pulmonary disease with acute lower respiratory infection: Secondary | ICD-10-CM | POA: Diagnosis present

## 2017-02-03 DIAGNOSIS — Z79899 Other long term (current) drug therapy: Secondary | ICD-10-CM | POA: Diagnosis not present

## 2017-02-03 DIAGNOSIS — Z923 Personal history of irradiation: Secondary | ICD-10-CM | POA: Diagnosis not present

## 2017-02-03 DIAGNOSIS — E785 Hyperlipidemia, unspecified: Secondary | ICD-10-CM | POA: Diagnosis present

## 2017-02-03 DIAGNOSIS — Z955 Presence of coronary angioplasty implant and graft: Secondary | ICD-10-CM | POA: Diagnosis not present

## 2017-02-03 DIAGNOSIS — F419 Anxiety disorder, unspecified: Secondary | ICD-10-CM | POA: Diagnosis present

## 2017-02-03 DIAGNOSIS — F17210 Nicotine dependence, cigarettes, uncomplicated: Secondary | ICD-10-CM | POA: Diagnosis present

## 2017-02-03 DIAGNOSIS — J441 Chronic obstructive pulmonary disease with (acute) exacerbation: Secondary | ICD-10-CM | POA: Diagnosis present

## 2017-02-03 DIAGNOSIS — J189 Pneumonia, unspecified organism: Secondary | ICD-10-CM | POA: Diagnosis present

## 2017-02-03 DIAGNOSIS — M810 Age-related osteoporosis without current pathological fracture: Secondary | ICD-10-CM | POA: Diagnosis present

## 2017-02-03 DIAGNOSIS — Z951 Presence of aortocoronary bypass graft: Secondary | ICD-10-CM | POA: Diagnosis not present

## 2017-02-03 DIAGNOSIS — Z853 Personal history of malignant neoplasm of breast: Secondary | ICD-10-CM | POA: Diagnosis not present

## 2017-02-03 DIAGNOSIS — Z881 Allergy status to other antibiotic agents status: Secondary | ICD-10-CM

## 2017-02-03 HISTORY — DX: Dependence on supplemental oxygen: Z99.81

## 2017-02-03 HISTORY — DX: Gastro-esophageal reflux disease without esophagitis: K21.9

## 2017-02-03 HISTORY — DX: Unspecified chronic bronchitis: J42

## 2017-02-03 HISTORY — DX: Pneumonia, unspecified organism: J18.9

## 2017-02-03 HISTORY — DX: Unspecified osteoarthritis, unspecified site: M19.90

## 2017-02-03 HISTORY — DX: Malignant neoplasm of unspecified site of right female breast: C50.911

## 2017-02-03 HISTORY — DX: Non-ST elevation (NSTEMI) myocardial infarction: I21.4

## 2017-02-03 HISTORY — DX: Family history of other specified conditions: Z84.89

## 2017-02-03 HISTORY — DX: Personal history of other medical treatment: Z92.89

## 2017-02-03 LAB — CBC WITH DIFFERENTIAL/PLATELET
Basophils Absolute: 0 10*3/uL (ref 0.0–0.1)
Basophils Relative: 0 %
Eosinophils Absolute: 0 10*3/uL (ref 0.0–0.7)
Eosinophils Relative: 0 %
HCT: 41.8 % (ref 36.0–46.0)
Hemoglobin: 13 g/dL (ref 12.0–15.0)
Lymphocytes Relative: 10 %
Lymphs Abs: 0.6 10*3/uL — ABNORMAL LOW (ref 0.7–4.0)
MCH: 30 pg (ref 26.0–34.0)
MCHC: 31.1 g/dL (ref 30.0–36.0)
MCV: 96.5 fL (ref 78.0–100.0)
Monocytes Absolute: 0.1 10*3/uL (ref 0.1–1.0)
Monocytes Relative: 2 %
Neutro Abs: 5.7 10*3/uL (ref 1.7–7.7)
Neutrophils Relative %: 88 %
Platelets: 265 10*3/uL (ref 150–400)
RBC: 4.33 MIL/uL (ref 3.87–5.11)
RDW: 14.5 % (ref 11.5–15.5)
WBC: 6.4 10*3/uL (ref 4.0–10.5)

## 2017-02-03 LAB — COMPREHENSIVE METABOLIC PANEL
ALT: 26 U/L (ref 14–54)
AST: 30 U/L (ref 15–41)
Albumin: 3.2 g/dL — ABNORMAL LOW (ref 3.5–5.0)
Alkaline Phosphatase: 56 U/L (ref 38–126)
Anion gap: 8 (ref 5–15)
BUN: 8 mg/dL (ref 6–20)
CO2: 33 mmol/L — ABNORMAL HIGH (ref 22–32)
Calcium: 9.6 mg/dL (ref 8.9–10.3)
Chloride: 96 mmol/L — ABNORMAL LOW (ref 101–111)
Creatinine, Ser: 0.93 mg/dL (ref 0.44–1.00)
GFR calc Af Amer: >60 mL/min (ref >60)
GFR calc non Af Amer: >60 mL/min (ref >60)
Glucose, Bld: 190 mg/dL — ABNORMAL HIGH (ref 65–99)
Potassium: 4.6 mmol/L (ref 3.5–5.1)
Sodium: 137 mmol/L (ref 135–145)
Total Bilirubin: 0.4 mg/dL (ref 0.3–1.2)
Total Protein: 7.5 g/dL (ref 6.5–8.1)

## 2017-02-03 LAB — BRAIN NATRIURETIC PEPTIDE: B Natriuretic Peptide: 167.4 pg/mL — ABNORMAL HIGH (ref 0.0–100.0)

## 2017-02-03 LAB — I-STAT TROPONIN, ED: Troponin i, poc: 0 ng/mL (ref 0.00–0.08)

## 2017-02-03 MED ORDER — METHYLPREDNISOLONE SODIUM SUCC 125 MG IJ SOLR
60.0000 mg | Freq: Two times a day (BID) | INTRAMUSCULAR | Status: DC
  Administered 2017-02-04 – 2017-02-05 (×3): 60 mg via INTRAVENOUS
  Filled 2017-02-03 (×3): qty 2

## 2017-02-03 MED ORDER — LEVOFLOXACIN IN D5W 750 MG/150ML IV SOLN
750.0000 mg | Freq: Once | INTRAVENOUS | Status: AC
  Administered 2017-02-03: 750 mg via INTRAVENOUS
  Filled 2017-02-03: qty 150

## 2017-02-03 MED ORDER — FLUTICASONE PROPIONATE 50 MCG/ACT NA SUSP
1.0000 | Freq: Every day | NASAL | Status: DC
  Administered 2017-02-04 – 2017-02-06 (×3): 1 via NASAL
  Filled 2017-02-03: qty 16

## 2017-02-03 MED ORDER — FERROUS SULFATE 325 (65 FE) MG PO TABS
325.0000 mg | ORAL_TABLET | Freq: Two times a day (BID) | ORAL | Status: DC
Start: 2017-02-04 — End: 2017-02-06
  Administered 2017-02-04 – 2017-02-06 (×5): 325 mg via ORAL
  Filled 2017-02-03 (×5): qty 1

## 2017-02-03 MED ORDER — METHYLPREDNISOLONE SODIUM SUCC 125 MG IJ SOLR
125.0000 mg | Freq: Once | INTRAMUSCULAR | Status: AC
  Administered 2017-02-03: 125 mg via INTRAVENOUS
  Filled 2017-02-03: qty 2

## 2017-02-03 MED ORDER — PANTOPRAZOLE SODIUM 40 MG PO TBEC
40.0000 mg | DELAYED_RELEASE_TABLET | Freq: Every day | ORAL | Status: DC
  Administered 2017-02-04 – 2017-02-06 (×3): 40 mg via ORAL
  Filled 2017-02-03 (×3): qty 1

## 2017-02-03 MED ORDER — VITAMIN C 500 MG PO TABS
500.0000 mg | ORAL_TABLET | Freq: Every day | ORAL | Status: DC
  Administered 2017-02-04 – 2017-02-06 (×3): 500 mg via ORAL
  Filled 2017-02-03 (×3): qty 1

## 2017-02-03 MED ORDER — IPRATROPIUM-ALBUTEROL 0.5-2.5 (3) MG/3ML IN SOLN
3.0000 mL | Freq: Four times a day (QID) | RESPIRATORY_TRACT | Status: DC
  Administered 2017-02-04 – 2017-02-06 (×8): 3 mL via RESPIRATORY_TRACT
  Filled 2017-02-03 (×9): qty 3

## 2017-02-03 MED ORDER — FUROSEMIDE 20 MG PO TABS
40.0000 mg | ORAL_TABLET | Freq: Two times a day (BID) | ORAL | Status: DC
  Administered 2017-02-03 – 2017-02-06 (×6): 40 mg via ORAL
  Filled 2017-02-03 (×6): qty 2

## 2017-02-03 MED ORDER — MONTELUKAST SODIUM 10 MG PO TABS
10.0000 mg | ORAL_TABLET | Freq: Every day | ORAL | Status: DC
  Administered 2017-02-03 – 2017-02-05 (×3): 10 mg via ORAL
  Filled 2017-02-03 (×3): qty 1

## 2017-02-03 MED ORDER — IPRATROPIUM-ALBUTEROL 0.5-2.5 (3) MG/3ML IN SOLN: 3.0000 mL | RESPIRATORY_TRACT | Status: DC | PRN

## 2017-02-03 MED ORDER — VITAMIN D 1000 UNITS PO TABS
1000.0000 [IU] | ORAL_TABLET | Freq: Every day | ORAL | Status: DC
  Administered 2017-02-04 – 2017-02-06 (×3): 1000 [IU] via ORAL
  Filled 2017-02-03 (×3): qty 1

## 2017-02-03 MED ORDER — LEVOCETIRIZINE DIHYDROCHLORIDE 5 MG PO TABS: 5.0000 mg | ORAL_TABLET | Freq: Every evening | ORAL | Status: DC

## 2017-02-03 MED ORDER — SODIUM CHLORIDE 0.9 % IV BOLUS (SEPSIS)
1000.0000 mL | Freq: Once | INTRAVENOUS | Status: AC
  Administered 2017-02-03: 1000 mL via INTRAVENOUS

## 2017-02-03 MED ORDER — GUAIFENESIN-DM 100-10 MG/5ML PO SYRP: 5.0000 mL | ORAL_SOLUTION | ORAL | Status: DC | PRN

## 2017-02-03 MED ORDER — ENOXAPARIN SODIUM 40 MG/0.4ML ~~LOC~~ SOLN
40.0000 mg | SUBCUTANEOUS | Status: DC
  Administered 2017-02-03 – 2017-02-05 (×3): 40 mg via SUBCUTANEOUS
  Filled 2017-02-03 (×3): qty 0.4

## 2017-02-03 MED ORDER — ASPIRIN EC 325 MG PO TBEC
325.0000 mg | DELAYED_RELEASE_TABLET | Freq: Every morning | ORAL | Status: DC
Start: 2017-02-04 — End: 2017-02-06
  Administered 2017-02-04 – 2017-02-06 (×3): 325 mg via ORAL
  Filled 2017-02-03 (×3): qty 1

## 2017-02-03 MED ORDER — OMEGA-3-ACID ETHYL ESTERS 1 G PO CAPS
1000.0000 mg | ORAL_CAPSULE | Freq: Every morning | ORAL | Status: DC
  Administered 2017-02-04 – 2017-02-06 (×3): 1000 mg via ORAL
  Filled 2017-02-03 (×3): qty 1

## 2017-02-03 MED ORDER — PRAVASTATIN SODIUM 40 MG PO TABS
40.0000 mg | ORAL_TABLET | Freq: Every day | ORAL | Status: DC
  Administered 2017-02-04 – 2017-02-05 (×2): 40 mg via ORAL
  Filled 2017-02-03 (×2): qty 1

## 2017-02-03 MED ORDER — LORATADINE 10 MG PO TABS
10.0000 mg | ORAL_TABLET | Freq: Every day | ORAL | Status: DC
  Administered 2017-02-03 – 2017-02-05 (×3): 10 mg via ORAL
  Filled 2017-02-03 (×3): qty 1

## 2017-02-03 MED ORDER — IPRATROPIUM-ALBUTEROL 0.5-2.5 (3) MG/3ML IN SOLN
3.0000 mL | Freq: Once | RESPIRATORY_TRACT | Status: AC
  Administered 2017-02-03: 3 mL via RESPIRATORY_TRACT
  Filled 2017-02-03: qty 3

## 2017-02-03 MED ORDER — LEVOFLOXACIN IN D5W 750 MG/150ML IV SOLN: 750.0000 mg | INTRAVENOUS | Status: DC

## 2017-02-03 MED ORDER — POTASSIUM CHLORIDE CRYS ER 20 MEQ PO TBCR
20.0000 meq | EXTENDED_RELEASE_TABLET | Freq: Every day | ORAL | Status: DC
  Administered 2017-02-04 – 2017-02-06 (×3): 20 meq via ORAL
  Filled 2017-02-03 (×3): qty 1

## 2017-02-03 MED ORDER — NORTRIPTYLINE HCL 25 MG PO CAPS
50.0000 mg | ORAL_CAPSULE | Freq: Every day | ORAL | Status: DC
  Administered 2017-02-03 – 2017-02-05 (×3): 50 mg via ORAL
  Filled 2017-02-03 (×3): qty 2

## 2017-02-03 MED ORDER — IPRATROPIUM-ALBUTEROL 0.5-2.5 (3) MG/3ML IN SOLN
3.0000 mL | RESPIRATORY_TRACT | Status: DC
  Administered 2017-02-03: 3 mL via RESPIRATORY_TRACT
  Filled 2017-02-03: qty 3

## 2017-02-03 MED ORDER — ALPRAZOLAM 0.25 MG PO TABS
1.0000 mg | ORAL_TABLET | Freq: Two times a day (BID) | ORAL | Status: DC | PRN
Start: 2017-02-03 — End: 2017-02-06
  Administered 2017-02-03 – 2017-02-05 (×4): 1 mg via ORAL
  Filled 2017-02-03 (×5): qty 4

## 2017-02-03 NOTE — ED Notes (Signed)
Patient daughter [name] [phone].

## 2017-02-03 NOTE — ED Notes (Signed)
Report attempted x 1

## 2017-02-03 NOTE — Telephone Encounter (Signed)
Please call back regarding an apeal,   Reference Number [id number]

## 2017-02-03 NOTE — ED Provider Notes (Signed)
I assumed care of this patient from Dr. Oleta Mouse at 1600.  Please see their note for further details of Hx, PE.  Briefly patient is a 71 y.o. female who presents with shortness of breath found to have pneumonia with exacerbation of COPD. Patient already treated with antibiotics and breathing treatments. Current plan is to await labs and call admission.  Admitted to hospitalist service for continued management.     Fatima Blank, MD 02/05/17 [phone]

## 2017-02-03 NOTE — Telephone Encounter (Signed)
PA appeal fax form received. PCP has signed.   Left message with reference number with [employer] number left in previous message.

## 2017-02-03 NOTE — H&P (Signed)
History and Physical    TATIONA STECH QBH:[id number] DOB: December 18, 1945 DOA: 02/03/2017  Referring MD/NP/PA: Dr. Brantley Stage  PCP: [name], MD    Patient coming from: home   Chief Complaint: Shortness of breath  HPI: [name] is a 71 y.o. female with medical history significant for COPD, on chronic oxygen at home 3-4 L nasal cannula oxygen support, chronic diastolic CHF with last 2-D echo in May 2017 with reserved ejection fraction, dyslipidemia. Patient presented to ED with worsening shortness of breath, congestion, cough occasionally productive of white sputum. Patient has experienced symptoms for past week or so. She was seen by primary care physician and given apparently doxycycline and she could not tolerate the side effects. Patient was most recently seen by primary care physician 02/02/2017 for nasal congestion, postnasal drip and runny nose. She was given levocetirizne and methylprednisolone her symptoms however progressed and she started to have chest pain with coughing and her shortness of breath was present even at rest. Patient reported her chest pain was present with coughing, it radiated to the back and at worst it was 5 out of 10 in intensity. She took nitroglycerin when EMS arrived to her home which apparently has provided significant symptomatic relief. Patient did not have any fevers or chills. No abdominal pain, nausea or vomiting.   ED Course: In ED, patient was hemodynamically stable. Blood pressure was 146/70, oxygen saturation 87% on 2 L nasal cannula oxygen support which was increased to about 5-6 L and her oxygen saturation was 93%. Blood work was relatively unremarkable. Chest x-ray showed possible pneumonia in the right middle lung lobe. She was started on empiric Levaquin. She was also given nebulizer treatments in ED and Solu-Medrol but continued to have shortness of breath and wheezing for which reason she was admitted for further management of pneumonia and  COPD.   Review of Systems:  Constitutional: Negative for fever, chills, diaphoresis, activity change, appetite change and fatigue.  HENT: Negative for ear pain, nosebleeds, congestion, facial swelling, rhinorrhea, neck pain, neck stiffness and ear discharge.   Eyes: Negative for pain, discharge, redness, itching and visual disturbance.  Respiratory: Per history of present illness  Cardiovascular: Negative for chest pain, palpitations and leg swelling.  Gastrointestinal: Negative for abdominal distention.  Genitourinary: Negative for dysuria, urgency, frequency, hematuria, flank pain, decreased urine volume, difficulty urinating and dyspareunia.  Musculoskeletal: Negative for back pain, joint swelling, arthralgias and gait problem.  Neurological: Negative for dizziness, tremors, seizures, syncope, facial asymmetry, speech difficulty, weakness, light-headedness, numbness and headaches.  Hematological: Negative for adenopathy. Does not bruise/bleed easily.  Psychiatric/Behavioral: Negative for hallucinations, behavioral problems, confusion, dysphoric mood, decreased concentration and agitation.   Past Medical History:  Diagnosis Date  . Abnormal LFTs 08/02/2011  . Acute liver failure 06/19/2013  . Acute renal failure ([place]) 06/19/2013  . Acute respiratory failure ([place]) 06/19/2013  . Altered mental status 08/01/2011  . Angina   . Anxiety   . Anxiety state, unspecified 12/03/2013  . Aortic stenosis    mild  . Breast cancer of upper-outer quadrant of left female breast ([place]) 08/21/2013  . CAD (coronary artery disease) of artery bypass graft 11/06/2013  . CAD (coronary artery disease), MI R/O 08/01/2011   PCI in 2006 (bare metal stent, unknown artery) - [place]   . Cancer of breast ([place]) dx'd 2008   RT Breast  . CHF,  acute diastolic, BNP 4k on admissio 08/01/2011  . Chronic respiratory failure ([place]) 02/02/2012  . Compression  fracture of L1 lumbar vertebra ([place]) 02/02/2012  . COPD (chronic obstructive  pulmonary disease) (HCC)    oxygen-dependent 4LPM [place]  . Coronary artery disease 2006   2 stents w/previous MI  . Depressed   . Diastolic CHF ([place])   . Dyslipidemia   . Heart murmur   . 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 History of breast cancer in female 06/23/2013  . History of nuclear stress test 08/03/2011   attenuation at apex - no perfusion defects   . HTN (hypertension) 11/06/2013  . Hyperkalemia, on ACE prior to admission 11/11/2011  . Hypertension   . Hyponatremia 01/31/2012  . Migraine   . Mild aortic stenosis 08/01/2011   AVA 1.69 cm2 (06/02/2011)   . Moderate to severe pulmonary hypertension ([place])   . Myocardial infarction [facility]) 2006   NSTEMI  . NSVT (nonsustained ventricular tachycardia) (HCC)    h/o    Past Surgical History:  Procedure Laterality Date  . BREAST LUMPECTOMY Right 2008  . CARDIAC CATHETERIZATION  2006  . CHOLECYSTECTOMY  2011  . ERCP N/A 09/05/2015   Procedure: ENDOSCOPIC RETROGRADE CHOLANGIOPANCREATOGRAPHY (ERCP);  Surgeon: Ladene Artist, MD;  Location: [facility];  Service: Endoscopy;  Laterality: N/A;  . PARTIAL COLECTOMY  2009   for obstruction: temporary ostomy, later reversed.   Marland Kitchen RIGHT HEART CATHETERIZATION N/A 09/05/2013   Procedure: RIGHT HEART CATH;  Surgeon: Jolaine Artist, MD;  Location: [facility] CATH LAB;  Service: Cardiovascular;  Laterality: N/A;  . TRANSTHORACIC ECHOCARDIOGRAM  11/12/2011   EF 09-32%, normal systolic function, grade 1 diastolic dysfunction; ventricular septal flattening (D-sign); mild AS; trace-mild MR; LA mildly dilated; RV mod dilated; RA mod dilated; severe pulm HTN; elevated CVP    Social history:  reports that she has been smoking Cigarettes.  She has a 11.75 pack-year smoking history. She has never used smokeless tobacco. She reports that she does not drink alcohol or use drugs.  Ambulation: Ambulates without assistance at baseline.  Allergies  Allergen Reactions  . Ceftriaxone Anaphylaxis and Other (See  Comments)    *ROCEPHIN*  "Blow up like a balloon"  . Hydroxyzine Shortness Of Breath and Other (See Comments)    Pt states med make her light headed, get sob sxs  . Doxycycline Nausea Only  . Lexapro [Escitalopram] Other (See Comments)    Pt states med make her dizzy    Family History  Problem Relation Age of Onset  . Schizophrenia Sister   . Alzheimer's disease Mother   . Hyperlipidemia Brother   . Heart disease Sister   . Diabetes Sister   . Cancer Neg Hx   . Stroke Neg Hx   . COPD Neg Hx   . Depression Neg Hx   . Drug abuse Neg Hx   . Early death Neg Hx   . Hypertension Neg Hx   . Kidney disease Neg Hx     Prior to Admission medications   Medication Sig Start Date End Date Taking? Authorizing Provider  ALPRAZolam Duanne Moron) 1 MG tablet TAKE 1 TABLET BY MOUTH TWICE A DAY AS NEEDED 01/13/17   [name], MD  Artificial Tear Ointment (DRY EYES OP) Apply 2 drops to eye daily as needed.    [provider]  aspirin 325 MG EC tablet Take 1 tablet (325 mg total) by mouth every morning. 09/21/15   Velvet Bathe, MD  ATROVENT [facility] 17 MCG/ACT inhaler USE 2 PUFFS EVERY FOUR HOURS AS NEEDED 06/01/16   [name],  MD  butalbital-acetaminophen-caffeine (FIORICET, ESGIC) 50-325-40 MG tablet TAKE 1 TO 2 TABLETS BY MOUTH AS NEEDED FOR HEADACHE 09/17/16   [name], MD  cholecalciferol (VITAMIN D) 1000 units tablet Take 1,000 Units by mouth daily.    [provider]  diclofenac sodium (VOLTAREN) 1 % GEL APPLY 4 G TOPICALLY DAILY AS NEEDED (APPLY TO HANDS). HAND PAIN 08/04/15   [provider]  ferrous sulfate 325 (65 FE) MG tablet Take 1 tablet (325 mg total) by mouth 2 (two) times daily with a meal. 12/29/16   [name], MD  fluticasone (FLONASE) 50 MCG/ACT nasal spray USE 2 SPRAYS INTO EACH NOSTRIL ONCE DAILY**REPEAT FOR 5 DAYS THEN STOP 04/18/16   [name], MD  furosemide (LASIX) 40 MG tablet TAKE 1 TABLET BY MOUTH TWICE A DAY 10/18/16   [name], MD  ipratropium-albuterol (DUONEB) 0.5-2.5 (3) MG/3ML SOLN USE 1 VIAL IN NEBILZER 2 TIMES A DAY AS NEEDED**DX J44.9 02/02/17   [name], MD  KLOR-CON M20 20 MEQ tablet TAKE 1 TABLET BY MOUTH EVERY DAY 12/31/16   [name], MD  levocetirizine (XYZAL) 5 MG tablet Take 1 tablet (5 mg total) by mouth every evening. 02/02/17   [name], MD  LIVALO 2 MG TABS Take 1 tablet by mouth every evening.  08/25/15   [provider]  methylPREDNISolone (MEDROL DOSEPAK) 4 MG TBPK tablet TAKE AS DIRECTED 02/02/17   [name], MD  montelukast (SINGULAIR) 10 MG tablet Take 1 tablet (10 mg total) by mouth at bedtime. 02/02/17   [name], MD  nortriptyline (PAMELOR) 25 MG capsule Take 2 capsules (50 mg total) by mouth at bedtime. 01/27/17   [name], MD  Omega 3 1200 MG CAPS Take 1,200 mg by mouth every morning.     [provider]  ondansetron (ZOFRAN) 8 MG tablet Take 8 mg by mouth as directed.    [provider]  ondansetron (ZOFRAN) 8 MG tablet TAKE 1 TABLET BY MOUTH TWICE A DAY AS NEEDED FOR NAUSEA OR VOMITING 02/01/17   [name], MD  OXYGEN-HELIUM IN Inhale 3 L into the lungs See admin instructions. Uses when needed during the day, uses continuous throughout the night    [provider]  pantoprazole (PROTONIX) 40 MG tablet TAKE 1 TABLET BY MOUTH ONCE A DAY 09/29/16   [name], MD  SYMBICORT 160-4.5 MCG/ACT inhaler INHALE 2 PUFFS INTO THE LUNGS 2 (TWO) TIMES DAILY. 10/18/16   [name], MD  VENTOLIN HFA 108 (90 Base) MCG/ACT inhaler INHALE 2 PUFFS INTO THE LUNGS EVERY 4 HOURS AS NEEDED 12/15/16   [name], MD  vitamin C (ASCORBIC ACID) 500 MG tablet Take 500 mg by mouth daily.    [provider]    Physical Exam: Vitals:   02/03/17 1515 02/03/17 1530 02/03/17 1553 02/03/17 1747  BP: 99/65 (!) 94/44 (!) 97/55 137/83  Pulse: 81 87 86 82  Resp: 18 (!) 21 (!) 21 13  SpO2: 94% 95% 97% 93%    Constitutional:  NAD, calm, comfortable Vitals:   02/03/17 1515 02/03/17 1530 02/03/17 1553 02/03/17 1747  BP: 99/65 (!) 94/44 (!) 97/55 137/83  Pulse: 81 87 86 82  Resp: 18 (!) 21 (!) 21 13  SpO2: 94% 95% 97% 93%   Eyes: PERRL, lids and conjunctivae normal ENMT: Mucous membranes are moist. Posterior pharynx clear of any exudate or lesions.Normal dentition.  Neck:  normal, supple, no masses, no thyromegaly Respiratory: Audible wheezing throughout all the lung lobes, rhonchi on the right side appreciated Cardiovascular: Regular rate and rhythm, no murmurs / rubs / gallops. No extremity edema. 2+ pedal pulses. No carotid bruits.  Abdomen: no tenderness, no masses palpated. No hepatosplenomegaly. Bowel sounds positive.  Musculoskeletal: no clubbing / cyanosis. No joint deformity upper and lower extremities. Good ROM, no contractures. Normal muscle tone.  Skin: no rashes, lesions, ulcers. No induration Neurologic: CN 2-12 grossly intact. Sensation intact, DTR normal. Strength 5/5 in all 4.  Psychiatric: Normal judgment and insight. Alert and oriented x 3. Normal mood.    Labs on Admission: I have personally reviewed following labs and imaging studies  CBC:  Recent Labs Lab 02/03/17 1329  WBC 6.4  NEUTROABS 5.7  HGB 13.0  HCT 41.8  MCV 96.5  PLT 400   Basic Metabolic Panel:  Recent Labs Lab 02/03/17 1634  NA 137  K 4.6  CL 96*  CO2 33*  GLUCOSE 190*  BUN 8  CREATININE 0.93  CALCIUM 9.6   GFR: Estimated Creatinine Clearance: 43.9 mL/min (by C-G formula based on SCr of 0.93 mg/dL). Liver Function Tests:  Recent Labs Lab 02/03/17 1634  AST 30  ALT 26  ALKPHOS 56  BILITOT 0.4  PROT 7.5  ALBUMIN 3.2*   No results for input(s): LIPASE, AMYLASE in the last 168 hours. No results for input(s): AMMONIA in the last 168 hours. Coagulation Profile: No results for input(s): INR, PROTIME in the last 168 hours. Cardiac Enzymes: No results for input(s): CKTOTAL, CKMB, CKMBINDEX, TROPONINI  in the last 168 hours. BNP (last 3 results) No results for input(s): PROBNP in the last 8760 hours. HbA1C: No results for input(s): HGBA1C in the last 72 hours. CBG: No results for input(s): GLUCAP in the last 168 hours. Lipid Profile: No results for input(s): CHOL, HDL, LDLCALC, TRIG, CHOLHDL, LDLDIRECT in the last 72 hours. Thyroid Function Tests: No results for input(s): TSH, T4TOTAL, FREET4, T3FREE, THYROIDAB in the last 72 hours. Anemia Panel: No results for input(s): VITAMINB12, FOLATE, FERRITIN, TIBC, IRON, RETICCTPCT in the last 72 hours. Urine analysis:    Component Value Date/Time   COLORURINE YELLOW 12/19/2013 1127   APPEARANCEUR CLEAR 12/19/2013 1127   LABSPEC <=1.005 (A) 12/19/2013 1127   PHURINE 7.0 12/19/2013 1127   GLUCOSEU NEGATIVE 12/19/2013 1127   HGBUR NEGATIVE 12/19/2013 1127   BILIRUBINUR NEGATIVE 12/19/2013 1127   KETONESUR NEGATIVE 12/19/2013 1127   PROTEINUR >300 (A) 06/20/2013 0059   UROBILINOGEN 0.2 12/19/2013 1127   NITRITE NEGATIVE 12/19/2013 1127   LEUKOCYTESUR MODERATE (A) 12/19/2013 1127   Sepsis Labs: @LABRCNTIP (procalcitonin:4,lacticidven:4) )No results found for this or any previous visit (from the past 240 hour(s)).   Radiological Exams on Admission: Dg Chest 2 View  Result Date: 02/03/2017 CLINICAL DATA:  Dyspnea, left-sided chest pain. EXAM: CHEST  2 VIEW COMPARISON:  Radiographs of March 03, 2016. FINDINGS: Stable cardiomediastinal silhouette. Atherosclerosis of thoracic aorta is noted. No pneumothorax or pleural effusion is noted. Left lung is clear. Focal opacity is seen in the right midlung which may represent focal atelectasis or infiltrate. Bony thorax is unremarkable. IMPRESSION: Aortic atherosclerosis. New focal opacity seen in right midlung concerning for subsegmental atelectasis or infiltrate. Followup PA and lateral chest X-ray is recommended in 3-4 weeks following trial of antibiotic therapy to ensure resolution and exclude  underlying malignancy. Electronically Signed   By: Marijo Conception, M.D.   On: 02/03/2017 14:26    EKG:  pending   Assessment/Plan  Principal Problem:   Acute on chronic respiratory failure with hypoxia (HCC) / Acute COPD exacerbation - Likely due to combination of COPD and pneumonia - CXR concerning for pneumonia in right mid lung lobe - Continue duoneb every 4 hours scha dn PRN for shortness of breath or wheezing - Continue solumedrol 60 mg IV Q 12 hours  - Continue oxygen support via [place] to keep O2 sats above 90%  Active Problems:   Right middle lobe pneumonia (HCC) - Started empiric Levaquin - Follow-up respiratory culture results come blood culture results, strep pneumonia    Chronic diastolic CHF (congestive heart failure) (HCC) - Reasonably compensated - Continue Lasix    CAD (coronary artery disease), native coronary artery - Continue aspirin    Dyslipidemia - Continue Pravachol    DVT prophylaxis: Lovenox subQ  Code Status: full code Family Communication: no family at the bedside Disposition Plan: admission to medical floor  Consults called: none  Admission status: inpatient    Leisa Lenz MD [facility] Pager [phone]  If 7PM-7AM, please contact night-coverage [email or web address] Password [email or web address]  02/03/2017, 6:14 PM

## 2017-02-03 NOTE — ED Triage Notes (Signed)
Patient presents to ed via [facility] states she has been sob x 2 weeks c/o chest pain 2-3 days. Was seen by her PCP yest and was started on Medrol dose pak also using homoe NEBS. c/o increased sob today. Patient was given asa324 mg and Ntg x 1 per ems. Rates pain 2/10. Patient was also given Neb albuterol5mg  and Atrovent .5 mg Solu-Medrol 125 mg IV per ems. Patient states she has a congested cough however unable to cough anything up. She is on home O2 @3  liters eery night,

## 2017-02-03 NOTE — ED Provider Notes (Signed)
[facility] DEPT Provider Note   CSN: [id number] Arrival date & time: 02/03/17  1307     History   Chief Complaint Chief Complaint  Patient presents with  . Shortness of Breath  . Chest Pain    HPI [name] is a 71 y.o. female.  HPI 71 year old female who presents with shortness of breath. She has a history of coronary artery disease status post CABG and stenting, diastolic heart failure, chronic respiratory failure on 3-4 L home oxygen for COPD, hypertension hyperlipidemia. States that for the past week she has had cough, congestion, runny nose. Was seen by her PCP and diagnosed with rhinosinusitis. Was initially given a course of Keflex, but her symptoms did not improve. Saw her PCP again yesterday and was prescribed a Medrol Dosepak and anti-allergy medications. Was having progressively worsening symptoms including shortness of breath with minimal activity, fatigue, and yesterday also began to have sharp upper back pains that radiated down the left arm,  that has been intermittent. Not associated with deep inspiration or movement. She says she has not been active and unsure if symptoms got worse with activity. EMS was called today due to worsening symptoms. She received nitroglycerin with resolution of her back pain. No lower extremity edema, orthopnea or PND. No fevers but having significant amount of chills this week.   Past Medical History:  Diagnosis Date  . Abnormal LFTs 08/02/2011  . Acute liver failure 06/19/2013  . Acute renal failure ([place]) 06/19/2013  . Acute respiratory failure ([place]) 06/19/2013  . Altered mental status 08/01/2011  . Angina   . Anxiety   . Anxiety state, unspecified 12/03/2013  . Aortic stenosis    mild  . Breast cancer of upper-outer quadrant of left female breast ([place]) 08/21/2013  . CAD (coronary artery disease) of artery bypass graft 11/06/2013  . CAD (coronary artery disease), MI R/O 08/01/2011   PCI in 2006 (bare metal stent, unknown artery) -  [place]   . Cancer of breast ([place]) dx'd 2008   RT Breast  . CHF,  acute diastolic, BNP 4k on admissio 08/01/2011  . Chronic respiratory failure ([place]) 02/02/2012  . Compression fracture of L1 lumbar vertebra ([place]) 02/02/2012  . COPD (chronic obstructive pulmonary disease) (HCC)    oxygen-dependent 4LPM [place]  . Coronary artery disease 2006   2 stents w/previous MI  . Depressed   . Diastolic CHF ([place])   . Dyslipidemia   . Heart murmur   . 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 History of breast cancer in female 06/23/2013  . History of nuclear stress test 08/03/2011   attenuation at apex - no perfusion defects   . HTN (hypertension) 11/06/2013  . Hyperkalemia, on ACE prior to admission 11/11/2011  . Hypertension   . Hyponatremia 01/31/2012  . Migraine   . Mild aortic stenosis 08/01/2011   AVA 1.69 cm2 (06/02/2011)   . Moderate to severe pulmonary hypertension ([place])   . Myocardial infarction [facility]) 2006   NSTEMI  . NSVT (nonsustained ventricular tachycardia) (HCC)    h/o    Patient Active Problem List   Diagnosis Date Noted  . Seasonal allergic rhinitis due to pollen 02/02/2017  . Iron deficiency anemia 12/21/2016  . Routine general medical examination at a health care facility 10/17/2015  . Hyperglycemia 10/11/2014  . CN (constipation) 04/10/2014  . Gastroesophageal reflux disease without esophagitis 04/10/2014  . GAD (generalized anxiety disorder) 12/03/2013  . HTN (hypertension) 11/06/2013  . CAD (coronary artery disease), native coronary artery  11/06/2013  . 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 Breast cancer of upper-outer quadrant of left female breast ([place]) 08/21/2013  . Drug abuse and dependence ([place]) 06/23/2013  . Osteoporosis 01/01/2013  . Chronic respiratory failure ([place]) 02/02/2012  . Chronic diastolic CHF (congestive heart failure) ([place]) 01/31/2012  . Other emphysema ([place]) 08/01/2011  . Hypercapnemia 08/01/2011  . Aortic valve stenosis 08/01/2011    Class: Chronic    . Hyperlipidemia with target LDL less than 70 08/01/2011  . Tobacco abuse 08/01/2011    Past Surgical History:  Procedure Laterality Date  . BREAST LUMPECTOMY Right 2008  . CARDIAC CATHETERIZATION  2006  . CHOLECYSTECTOMY  2011  . ERCP N/A 09/05/2015   Procedure: ENDOSCOPIC RETROGRADE CHOLANGIOPANCREATOGRAPHY (ERCP);  Surgeon: Ladene Artist, MD;  Location: [facility];  Service: Endoscopy;  Laterality: N/A;  . PARTIAL COLECTOMY  2009   for obstruction: temporary ostomy, later reversed.   Marland Kitchen RIGHT HEART CATHETERIZATION N/A 09/05/2013   Procedure: RIGHT HEART CATH;  Surgeon: Jolaine Artist, MD;  Location: [facility] CATH LAB;  Service: Cardiovascular;  Laterality: N/A;  . TRANSTHORACIC ECHOCARDIOGRAM  11/12/2011   EF 25-63%, normal systolic function, grade 1 diastolic dysfunction; ventricular septal flattening (D-sign); mild AS; trace-mild MR; LA mildly dilated; RV mod dilated; RA mod dilated; severe pulm HTN; elevated CVP    OB History    No data available       Home Medications    Prior to Admission medications   Medication Sig Start Date End Date Taking? Authorizing Provider  ALPRAZolam Duanne Moron) 1 MG tablet TAKE 1 TABLET BY MOUTH TWICE A DAY AS NEEDED 01/13/17   [name], MD  Artificial Tear Ointment (DRY EYES OP) Apply 2 drops to eye daily as needed.    [provider]  aspirin 325 MG EC tablet Take 1 tablet (325 mg total) by mouth every morning. 09/21/15   Velvet Bathe, MD  ATROVENT HFA 17 MCG/ACT inhaler USE 2 PUFFS EVERY FOUR HOURS AS NEEDED 06/01/16   [name], MD  butalbital-acetaminophen-caffeine (FIORICET, ESGIC) [phone] MG tablet TAKE 1 TO 2 TABLETS BY MOUTH AS NEEDED FOR HEADACHE 09/17/16   [name], MD  cholecalciferol (VITAMIN D) 1000 units tablet Take 1,000 Units by mouth daily.    [provider]  diclofenac sodium (VOLTAREN) 1 % GEL APPLY 4 G TOPICALLY DAILY AS NEEDED (APPLY TO HANDS). HAND PAIN 08/04/15   [provider]   ferrous sulfate 325 (65 FE) MG tablet Take 1 tablet (325 mg total) by mouth 2 (two) times daily with a meal. 12/29/16   [name], MD  fluticasone (FLONASE) 50 MCG/ACT nasal spray USE 2 SPRAYS INTO EACH NOSTRIL ONCE DAILY**REPEAT FOR 5 DAYS THEN STOP 04/18/16   [name], MD  furosemide (LASIX) 40 MG tablet TAKE 1 TABLET BY MOUTH TWICE A DAY 10/18/16   [name], MD  ipratropium-albuterol (DUONEB) 0.5-2.5 (3) MG/3ML SOLN USE 1 VIAL IN NEBILZER 2 TIMES A DAY AS NEEDED**DX J44.9 02/02/17   [name], MD  KLOR-CON M20 20 MEQ tablet TAKE 1 TABLET BY MOUTH EVERY DAY 12/31/16   [name], MD  levocetirizine (XYZAL) 5 MG tablet Take 1 tablet (5 mg total) by mouth every evening. 02/02/17   [name], MD  LIVALO 2 MG TABS Take 1 tablet by mouth every evening.  08/25/15   [provider]  methylPREDNISolone (MEDROL DOSEPAK) 4 MG TBPK tablet TAKE AS DIRECTED 02/02/17  [name], MD  montelukast (SINGULAIR) 10 MG tablet Take 1 tablet (10 mg total) by mouth at bedtime. 02/02/17   [name], MD  nortriptyline (PAMELOR) 25 MG capsule Take 2 capsules (50 mg total) by mouth at bedtime. 01/27/17   [name], MD  Omega 3 1200 MG CAPS Take 1,200 mg by mouth every morning.     [provider]  ondansetron (ZOFRAN) 8 MG tablet Take 8 mg by mouth as directed.    [provider]  ondansetron (ZOFRAN) 8 MG tablet TAKE 1 TABLET BY MOUTH TWICE A DAY AS NEEDED FOR NAUSEA OR VOMITING 02/01/17   [name], MD  OXYGEN-HELIUM IN Inhale 3 L into the lungs See admin instructions. Uses when needed during the day, uses continuous throughout the night    [provider]  pantoprazole (PROTONIX) 40 MG tablet TAKE 1 TABLET BY MOUTH ONCE A DAY 09/29/16   [name], MD  SYMBICORT 160-4.5 MCG/ACT inhaler INHALE 2 PUFFS INTO THE LUNGS 2 (TWO) TIMES DAILY. 10/18/16   [name], MD  VENTOLIN HFA 108 (90 Base) MCG/ACT inhaler INHALE 2 PUFFS INTO THE LUNGS  EVERY 4 HOURS AS NEEDED 12/15/16   [name], MD  vitamin C (ASCORBIC ACID) 500 MG tablet Take 500 mg by mouth daily.    [provider]    Family History Family History  Problem Relation Age of Onset  . Schizophrenia Sister   . Alzheimer's disease Mother   . Hyperlipidemia Brother   . Heart disease Sister   . Diabetes Sister   . Cancer Neg Hx   . Stroke Neg Hx   . COPD Neg Hx   . Depression Neg Hx   . Drug abuse Neg Hx   . Early death Neg Hx   . Hypertension Neg Hx   . Kidney disease Neg Hx     Social History Social History  Substance Use Topics  . Smoking status: Current Some Day Smoker    Packs/day: 0.25    Years: 47.00    Types: Cigarettes  . Smokeless tobacco: Never Used     Comment: smokes when anxious, uses eCigs as well.   . Alcohol use No     Comment: wine with dinner occasionally     Allergies   Ceftriaxone; Hydroxyzine; Doxycycline; and Lexapro [escitalopram]   Review of Systems Review of Systems  Constitutional: Negative for fever.  HENT: Positive for congestion, postnasal drip and rhinorrhea.   Respiratory: Positive for cough.   Cardiovascular: Positive for chest pain. Negative for leg swelling.  Gastrointestinal: Positive for nausea.  Musculoskeletal: Positive for back pain.  Neurological: Negative for syncope.  Psychiatric/Behavioral: Negative for confusion.  All other systems reviewed and are negative.    Physical Exam Updated Vital Signs BP (!) 97/55   Pulse 86   Resp (!) 21   SpO2 97%   Physical Exam Physical Exam  Nursing note and vitals reviewed. Constitutional: chronically ill appearing, non-toxic, and in no acute distress Head: Normocephalic and atraumatic.  Mouth/Throat: Oropharynx is clear.  Neck: Normal range of motion. Neck supple.  Cardiovascular: Normal rate and regular rhythm.   Pulmonary/Chest: no accessory muscle usage. No conversational dyspnea. Rhonchi throughout with expiratory wheezing.  Abdominal:  Soft. Mild distension.There is no tenderness. There is no rebound and no guarding.  Musculoskeletal: Normal range of motion. no LE edema Neurological: Alert, no facial droop, fluent speech, moves all extremities symmetrically Skin: Skin is warm and  dry.  Psychiatric: Cooperative   ED Treatments / Results  Labs (all labs ordered are listed, but only abnormal results are displayed) Labs Reviewed  CBC WITH DIFFERENTIAL/PLATELET - Abnormal; Notable for the following:       Result Value   Lymphs Abs 0.6 (*)    All other components within normal limits  BRAIN NATRIURETIC PEPTIDE - Abnormal; Notable for the following:    B Natriuretic Peptide 167.4 (*)    All other components within normal limits  COMPREHENSIVE METABOLIC PANEL  I-STAT TROPOININ, ED    EKG  EKG Interpretation None       Radiology Dg Chest 2 View  Result Date: 02/03/2017 CLINICAL DATA:  Dyspnea, left-sided chest pain. EXAM: CHEST  2 VIEW COMPARISON:  Radiographs of March 03, 2016. FINDINGS: Stable cardiomediastinal silhouette. Atherosclerosis of thoracic aorta is noted. No pneumothorax or pleural effusion is noted. Left lung is clear. Focal opacity is seen in the right midlung which may represent focal atelectasis or infiltrate. Bony thorax is unremarkable. IMPRESSION: Aortic atherosclerosis. New focal opacity seen in right midlung concerning for subsegmental atelectasis or infiltrate. Followup PA and lateral chest X-ray is recommended in 3-4 weeks following trial of antibiotic therapy to ensure resolution and exclude underlying malignancy. Electronically Signed   By: Marijo Conception, M.D.   On: 02/03/2017 14:26    Procedures Procedures (including critical care time)  Medications Ordered in ED Medications  ipratropium-albuterol (DUONEB) 0.5-2.5 (3) MG/3ML nebulizer solution 3 mL (3 mLs Nebulization Given 02/03/17 1451)  methylPREDNISolone sodium succinate (SOLU-MEDROL) 125 mg/2 mL injection 125 mg (125 mg Intravenous  Given 02/03/17 1532)  levofloxacin (LEVAQUIN) IVPB 750 mg (0 mg Intravenous Stopped 02/03/17 1557)     Initial Impression / Assessment and Plan / ED Course  I have reviewed the triage vital signs and the nursing notes.  Pertinent labs & imaging results that were available during my care of the patient were reviewed by me and considered in my medical decision making (see chart for details).     Presents with worsening cough, shortness of breath and fatigue in setting of recent treatment with Keflex for rhinosinusitis. Patient maintaining normal oxygenation on her home oxygen, without significant respiratory distress. Lungs coarse with wheezing throughout. Presentation is concerning for an acute COPD exacerbation. She is not fluid overloaded. Chest x-ray visualized and shows no edema but she does have potential right middle lobe infiltrate and clinical symptoms of pneumonia. No recent hospitalizations, and likely community or pneumonia but with anaphylaxis to ceftriaxone. She is subsequently covered with Levaquin. Has received breathing treatments and Solu-Medrol for COPD exacerbation. Discussed initially with Dr. Allyson Sabal, but refused admission at this time due to unresulted CMP. Will reconsult hospitalist service for admission after CMP returns.  Final Clinical Impressions(s) / ED Diagnoses   Final diagnoses:  Lobar pneumonia ([place])  Acute exacerbation of chronic obstructive pulmonary disease (COPD) ([place])    New Prescriptions New Prescriptions   No medications on file     Forde Dandy, MD 02/03/17 [phone]

## 2017-02-04 ENCOUNTER — Telehealth: Payer: Self-pay

## 2017-02-04 ENCOUNTER — Telehealth: Payer: Self-pay | Admitting: Internal Medicine

## 2017-02-04 ENCOUNTER — Other Ambulatory Visit: Payer: Self-pay | Admitting: Oncology

## 2017-02-04 LAB — CBC
HCT: 36.5 % (ref 36.0–46.0)
Hemoglobin: 11.5 g/dL — ABNORMAL LOW (ref 12.0–15.0)
MCH: 29.8 pg (ref 26.0–34.0)
MCHC: 31.5 g/dL (ref 30.0–36.0)
MCV: 94.6 fL (ref 78.0–100.0)
Platelets: 199 10*3/uL (ref 150–400)
RBC: 3.86 MIL/uL — ABNORMAL LOW (ref 3.87–5.11)
RDW: 13.9 % (ref 11.5–15.5)
WBC: 5.9 10*3/uL (ref 4.0–10.5)

## 2017-02-04 LAB — BASIC METABOLIC PANEL
Anion gap: 7 (ref 5–15)
BUN: 8 mg/dL (ref 6–20)
CO2: 30 mmol/L (ref 22–32)
Calcium: 8.9 mg/dL (ref 8.9–10.3)
Chloride: 95 mmol/L — ABNORMAL LOW (ref 101–111)
Creatinine, Ser: 0.83 mg/dL (ref 0.44–1.00)
GFR calc Af Amer: >60 mL/min (ref >60)
GFR calc non Af Amer: >60 mL/min (ref >60)
Glucose, Bld: 127 mg/dL — ABNORMAL HIGH (ref 65–99)
Potassium: 4 mmol/L (ref 3.5–5.1)
Sodium: 132 mmol/L — ABNORMAL LOW (ref 135–145)

## 2017-02-04 MED ORDER — LEVOFLOXACIN IN D5W 500 MG/100ML IV SOLN
500.0000 mg | INTRAVENOUS | Status: DC
Start: 2017-02-05 — End: 2017-02-04

## 2017-02-04 MED ORDER — LACTULOSE 10 GM/15ML PO SOLN
10.0000 g | Freq: Three times a day (TID) | ORAL | Status: DC
  Administered 2017-02-04 – 2017-02-06 (×8): 10 g via ORAL
  Filled 2017-02-04 (×8): qty 15

## 2017-02-04 MED ORDER — AZITHROMYCIN 500 MG PO TABS
500.0000 mg | ORAL_TABLET | ORAL | Status: DC
  Administered 2017-02-04 – 2017-02-05 (×2): 500 mg via ORAL
  Filled 2017-02-04 (×3): qty 1

## 2017-02-04 NOTE — Progress Notes (Signed)
PROGRESS NOTE    [name]  IFO:[id number] DOB: 07-02-1946 DOA: 02/03/2017 PCP: [name], MD     Brief Narrative:  [name] is a 71 y.o. female with medical history significant for COPD, on chronic oxygen at home 3-4 L nasal cannula oxygen support, chronic diastolic CHF with last 2-D echo in May 2017 with reserved ejection fraction, dyslipidemia. Patient presented to ED with worsening shortness of breath, congestion, cough occasionally productive of white sputum. She was seen by primary care physician and given apparently doxycycline and she could not tolerate the side effects. Patient was most recently seen by primary care physician 02/02/2017 for nasal congestion, postnasal drip and runny nose. She was given levocetirizne and methylprednisolone her symptoms however progressed. Chest x-ray showed possible pneumonia in the right middle lung lobe. She was started on empiric Levaquin. She was also given nebulizer treatments in ED and Solu-Medrol but continued to have shortness of breath and wheezing for which reason she was admitted for further management of pneumonia and COPD.   Assessment & Plan:   Principal Problem:   Acute on chronic respiratory failure with hypoxia (HCC) Active Problems:   Chronic diastolic CHF (congestive heart failure) (HCC)   CAD (coronary artery disease), native coronary artery   COPD with acute exacerbation (HCC)   Right middle lobe pneumonia (HCC)   Dyslipidemia  Acute on chronic hypoxemic respiratory failure -Uses 3-4L [place] prn during the day, with exertion, and at nighttime  RML CAP  -Azithromax. Pt allergic to rocephin  -Blood cultures pending  -Strep pneumo ag pending  -Sputum culture pending  -Recommend repeat CXR in 3-4 weeks after resolution of illness to r/o underlying malignancy   Acute COPD exacerbation -Solumedrol, duonebs, antibiotics as above. Taper steroids with patient improvement   Chronic diastolic CHF -Compensated. BNP  167  -Continue Lasix  CAD (coronary artery disease), native coronary artery -Continue aspirin  Dyslipidemia -Continue Pravachol  Hx right breath cancer s/p lumpectomy, radiation -No evidence of recurrence, follow up with PCP. Appreciate Dr. Jana Hakim    DVT prophylaxis: lovenox Code Status: full Family Communication: no family at bedside Disposition Plan: pending improvement    Consultants:   None  Procedures:   None  Antimicrobials:  Anti-infectives    Start     Dose/Rate Route Frequency Ordered Stop   02/05/17 1530  levofloxacin (LEVAQUIN) IVPB 750 mg  Status:  Discontinued     750 mg 100 mL/hr over 90 Minutes Intravenous Every 48 hours 02/03/17 1816 02/04/17 0843   02/05/17 1530  levofloxacin (LEVAQUIN) IVPB 500 mg  Status:  Discontinued     500 mg 100 mL/hr over 60 Minutes Intravenous Every 48 hours 02/04/17 0843 02/04/17 1130   02/04/17 1145  azithromycin (ZITHROMAX) tablet 500 mg     500 mg Oral Daily 02/04/17 1130     02/03/17 1500  levofloxacin (LEVAQUIN) IVPB 750 mg     750 mg 100 mL/hr over 90 Minutes Intravenous  Once 02/03/17 1455 02/03/17 1557          Subjective: Patient with symptomatic improvement. She states that her breathing has improved. Denies any fevers or chills. No chest pain. No nausea or vomiting.  Objective: Vitals:   02/03/17 1936 02/03/17 2022 02/04/17 0444 02/04/17 0951  BP: 137/80  (!) 166/84   Pulse: 88  88 88  Resp: 18  18 19   Temp: 97.5 F (36.4 C)  98 F (36.7 C)   TempSrc: Oral  Oral   SpO2: 99%  95% 94% 96%  Weight:      Height:        Intake/Output Summary (Last 24 hours) at 02/04/17 1123 Last data filed at 02/04/17 0900  Gross per 24 hour  Intake              740 ml  Output                0 ml  Net              740 ml   Filed Weights   02/03/17 1856  Weight: 57.2 kg (126 lb 3.2 oz)    Examination:  General exam: Appears calm and comfortable  Respiratory system: Wheezing diffusely with rhonchi.  Respiratory effort normal. On [place] O2  Cardiovascular system: S1 & S2 heard, RRR. No JVD, murmurs, rubs, gallops or clicks. No pedal edema. Gastrointestinal system: Abdomen is nondistended, soft and nontender. No organomegaly or masses felt. Normal bowel sounds heard. Central nervous system: Alert and oriented. No focal neurological deficits. Extremities: Symmetric 5 x 5 power. Skin: No rashes, lesions or ulcers Psychiatry: Judgement and insight appear normal. Mood & affect appropriate.   Data Reviewed: I have personally reviewed following labs and imaging studies  CBC:  Recent Labs Lab 02/03/17 1329 02/04/17 0326  WBC 6.4 5.9  NEUTROABS 5.7  --   HGB 13.0 11.5*  HCT 41.8 36.5  MCV 96.5 94.6  PLT 265 878   Basic Metabolic Panel:  Recent Labs Lab 02/03/17 1634 02/04/17 0326  NA 137 132*  K 4.6 4.0  CL 96* 95*  CO2 33* 30  GLUCOSE 190* 127*  BUN 8 8  CREATININE 0.93 0.83  CALCIUM 9.6 8.9   GFR: Estimated Creatinine Clearance: 49.2 mL/min (by C-G formula based on SCr of 0.83 mg/dL). Liver Function Tests:  Recent Labs Lab 02/03/17 1634  AST 30  ALT 26  ALKPHOS 56  BILITOT 0.4  PROT 7.5  ALBUMIN 3.2*   No results for input(s): LIPASE, AMYLASE in the last 168 hours. No results for input(s): AMMONIA in the last 168 hours. Coagulation Profile: No results for input(s): INR, PROTIME in the last 168 hours. Cardiac Enzymes: No results for input(s): CKTOTAL, CKMB, CKMBINDEX, TROPONINI in the last 168 hours. BNP (last 3 results) No results for input(s): PROBNP in the last 8760 hours. HbA1C: No results for input(s): HGBA1C in the last 72 hours. CBG: No results for input(s): GLUCAP in the last 168 hours. Lipid Profile: No results for input(s): CHOL, HDL, LDLCALC, TRIG, CHOLHDL, LDLDIRECT in the last 72 hours. Thyroid Function Tests: No results for input(s): TSH, T4TOTAL, FREET4, T3FREE, THYROIDAB in the last 72 hours. Anemia Panel: No results for input(s):  VITAMINB12, FOLATE, FERRITIN, TIBC, IRON, RETICCTPCT in the last 72 hours. Sepsis Labs: No results for input(s): PROCALCITON, LATICACIDVEN in the last 168 hours.  Recent Results (from the past 240 hour(s))  Culture, blood (routine x 2) Call MD if unable to obtain prior to antibiotics being given     Status: None (Preliminary result)   Collection Time: 02/03/17  7:02 PM  Result Value Ref Range Status   Specimen Description BLOOD LEFT ANTECUBITAL  Final   Special Requests   Final    BOTTLES DRAWN AEROBIC AND ANAEROBIC Blood Culture adequate volume   Culture NO GROWTH < 24 HOURS  Final   Report Status PENDING  Incomplete  Culture, blood (routine x 2) Call MD if unable to obtain prior to antibiotics being given  Status: None (Preliminary result)   Collection Time: 02/03/17  7:02 PM  Result Value Ref Range Status   Specimen Description BLOOD LEFT ANTECUBITAL  Final   Special Requests IN PEDIATRIC BOTTLE Blood Culture adequate volume  Final   Culture NO GROWTH < 24 HOURS  Final   Report Status PENDING  Incomplete       Radiology Studies: Dg Chest 2 View  Result Date: 02/03/2017 CLINICAL DATA:  Dyspnea, left-sided chest pain. EXAM: CHEST  2 VIEW COMPARISON:  Radiographs of March 03, 2016. FINDINGS: Stable cardiomediastinal silhouette. Atherosclerosis of thoracic aorta is noted. No pneumothorax or pleural effusion is noted. Left lung is clear. Focal opacity is seen in the right midlung which may represent focal atelectasis or infiltrate. Bony thorax is unremarkable. IMPRESSION: Aortic atherosclerosis. New focal opacity seen in right midlung concerning for subsegmental atelectasis or infiltrate. Followup PA and lateral chest X-ray is recommended in 3-4 weeks following trial of antibiotic therapy to ensure resolution and exclude underlying malignancy. Electronically Signed   By: Marijo Conception, M.D.   On: 02/03/2017 14:26      Scheduled Meds: . aspirin  325 mg Oral q morning - 10a  .  cholecalciferol  1,000 Units Oral Daily  . enoxaparin (LOVENOX) injection  40 mg Subcutaneous Q24H  . ferrous sulfate  325 mg Oral BID WC  . fluticasone  1 spray Each Nare Daily  . furosemide  40 mg Oral BID  . ipratropium-albuterol  3 mL Nebulization QID  . lactulose  10 g Oral TID  . loratadine  10 mg Oral QHS  . methylPREDNISolone (SOLU-MEDROL) injection  60 mg Intravenous Q12H  . montelukast  10 mg Oral QHS  . nortriptyline  50 mg Oral QHS  . omega-3 acid ethyl esters  1,000 mg Oral q morning - 10a  . pantoprazole  40 mg Oral Daily  . potassium chloride SA  20 mEq Oral Daily  . pravastatin  40 mg Oral q1800  . vitamin C  500 mg Oral Daily   Continuous Infusions: . [START ON 02/05/2017] levofloxacin (LEVAQUIN) IV       LOS: 1 day    Time spent: 40 minutes   Dessa Phi, DO [facility] [email or web address] Password [facility] 02/04/2017, 11:23 AM

## 2017-02-04 NOTE — Evaluation (Signed)
Physical Therapy Evaluation Patient Details Name: [name] MRN: [id number] DOB: November 28, 1945 Today's Date: 02/04/2017   History of Present Illness  Pt is a 71 y/o female admitted secondary to worsening SOB and cough, found to have acute on chronic respiratory failure likely due to a combination of COPD and pneumonia. PMH including but not limited to breast cancer, CAD s/p MI in 2006, CHF, COPD (on supplemental O2 at night and PRN during the day - per pt's report) and HTN.  Clinical Impression  Pt presented sitting EOB, awake and willing to participate in therapy session. Prior to admission, pt reported that she was independent with all functional mobility and ADLs. Pt ambulated in the hallway on RA with SPO2 decreasing to 87%, with quick recovery to 91% after sitting rest break of ~30 seconds and donning supplemental O2. PT will continue to follow pt acutely to ensure a safe d/c home.     Follow Up Recommendations No PT follow up    Equipment Recommendations  None recommended by PT    Recommendations for Other Services       Precautions / Restrictions Precautions Precautions: None Restrictions Weight Bearing Restrictions: No      Mobility  Bed Mobility               General bed mobility comments: pt sitting EOB when therapist entered room  Transfers Overall transfer level: Modified independent Equipment used: None                Ambulation/Gait Ambulation/Gait assistance: Min guard Ambulation Distance (Feet): 100 Feet Assistive device: None Gait Pattern/deviations: Step-through pattern;Decreased stride length;Drifts right/left Gait velocity: decreased Gait velocity interpretation: Below normal speed for age/gender General Gait Details: mild instability but no LOB or need for physical assistance, min guard for safety  Stairs            Wheelchair Mobility    Modified Rankin (Stroke Patients Only)       Balance Overall balance assessment:  Needs assistance Sitting-balance support: Feet supported Sitting balance-Leahy Scale: Normal     Standing balance support: During functional activity;No upper extremity supported Standing balance-Leahy Scale: Fair                               Pertinent Vitals/Pain Pain Assessment: No/denies pain    Home Living Family/patient expects to be discharged to:: Private residence Living Arrangements: Children Available Help at Discharge: Family;Available 24 hours/day Type of Home: House Home Access: Stairs to enter Entrance Stairs-Rails: None Entrance Stairs-Number of Steps: 2 Home Layout: One level Home Equipment: Cane - single point      Prior Function Level of Independence: Independent               Hand Dominance        Extremity/Trunk Assessment   Upper Extremity Assessment Upper Extremity Assessment: Overall WFL for tasks assessed    Lower Extremity Assessment Lower Extremity Assessment: Overall WFL for tasks assessed    Cervical / Trunk Assessment Cervical / Trunk Assessment: Normal  Communication   Communication: No difficulties  Cognition Arousal/Alertness: Awake/alert Behavior During Therapy: WFL for tasks assessed/performed Overall Cognitive Status: Within Functional Limits for tasks assessed                                        General Comments  Exercises     Assessment/Plan    PT Assessment Patient needs continued PT services  PT Problem List Decreased balance;Decreased coordination;Decreased mobility;Decreased knowledge of use of DME;Decreased safety awareness;Cardiopulmonary status limiting activity       PT Treatment Interventions DME instruction;Gait training;Stair training;Functional mobility training;Therapeutic activities;Therapeutic exercise;Balance training;Neuromuscular re-education;Patient/family education    PT Goals (Current goals can be found in the Care Plan section)  Acute Rehab PT  Goals Patient Stated Goal: return home  PT Goal Formulation: With patient Time For Goal Achievement: 02/18/17 Potential to Achieve Goals: Good    Frequency Min 3X/week   Barriers to discharge        Co-evaluation               AM-PAC PT "6 Clicks" Daily Activity  Outcome Measure Difficulty turning over in bed (including adjusting bedclothes, sheets and blankets)?: A Little Difficulty moving from lying on back to sitting on the side of the bed? : A Little Difficulty sitting down on and standing up from a chair with arms (e.g., wheelchair, bedside commode, etc,.)?: None Help needed moving to and from a bed to chair (including a wheelchair)?: None Help needed walking in hospital room?: A Little Help needed climbing 3-5 steps with a railing? : A Little 6 Click Score: 20    End of Session Equipment Utilized During Treatment: Gait belt Activity Tolerance: Patient tolerated treatment well Patient left: with call bell/phone within reach;Other (comment) (sitting EOB) Nurse Communication: Mobility status PT Visit Diagnosis: Other abnormalities of gait and mobility (R26.89)    Time: 3500-9381 PT Time Calculation (min) (ACUTE ONLY): 14 min   Charges:   PT Evaluation $PT Eval Moderate Complexity: 1 Procedure     PT G Codes:        Sherie Don, PT, DPT [place] 02/04/2017, 3:42 PM

## 2017-02-04 NOTE — Telephone Encounter (Signed)
Pts ipratropium-albuterol (DUONEB) 0.5-2.5 (3) MG/3ML nebulizer solution 3 mL   PA has been denied, it doesn't look like we prescibed this.

## 2017-02-04 NOTE — Progress Notes (Signed)
COURTESY NOTE:  Appreciate learning of patient's admission. Am providing a summary of her breast cancer history below. She has been released from follow-up with [place].  71 y.o. 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  #2  She completed [facility] with Herceptin for one year in May 2010.  #3  She underwent radiation therapy in May of 2010.  #4  She started Arimidex in 2010 and has tolerated it well since.   #5 osteopenia. Received zolendronate April 2014 and denosumab 06/17/2014  #6 continuing tobacco abuse. The patient has been again advised about permanently quitting  #7 emphysema plus or minus bronchiolitis, with multiple small pulmonary nodules felt to be likely benign  PLAN:   Ila is now nearly 7 years out from her definitive lumpectomy. She completed 5 years of anastrozole. There is no definite evidence of breast cancer metastases or recurrence. She is at significant risk for a primary pulmonary malignancy. However she has excellent cardiac and pulmonary followup and at this point I feel comfortable releasing her to her primary care physician.

## 2017-02-04 NOTE — Telephone Encounter (Signed)
Pt has been admitted to the hospital.    PA was denied.

## 2017-02-04 NOTE — Telephone Encounter (Signed)
Barbara Racer, LPN      5/87/27 61:84 AM  Note    received  phone call about PA appeal, answered questions asked and it is now being reviewed

## 2017-02-04 NOTE — Consult Note (Signed)
           [facility] CM Primary Care Navigator  02/04/2017  [name] [birth year] [id number]  Went to see patient at the bedside to identify possible discharge needs. Patient reports having cough, difficulty breathing, pain to chest and back that had led to this admission.  Patient endorses Dr. Scarlette Calico with [facility] [place] as the primary care provider.   Patient shared using [facility] at [facility] to obtain medications without any problem.   Patient reports managing her medications at home using "pill box" system weekly.   She states that daughter [employer]) provides transportation to her doctors' appointments or she uses [employer] transportation.  Patient lives with daughter but reports being independent and active at home. She states that her daughter assists with her care as needed.  Anticipated discharge plan is home according to patient.  Patient voiced understanding to call primary care provider's office when she returns home, for a post discharge follow-up appointment within a week or sooner if needed. Patient letter (with PCP's contact number) was provided as a reminder.  Explained to patient about [facility] CM services available for health management and she verbally agreed/ opted EMMI Pneumoniacalls to help with her recovery at home.   Referral was made for EMMI Pneumoniacalls after discharge.   For questions, please contact:  Dannielle Huh, BSN, RN- [facility] Primary Care Navigator  Telephone: [phone] [place]

## 2017-02-04 NOTE — Telephone Encounter (Signed)
received  phone call about PA appeal, answered questions asked and it is now being reviewed

## 2017-02-05 DIAGNOSIS — I5032 Chronic diastolic (congestive) heart failure: Secondary | ICD-10-CM

## 2017-02-05 LAB — CBC
HCT: 39.4 % (ref 36.0–46.0)
Hemoglobin: 12.4 g/dL (ref 12.0–15.0)
MCH: 29.7 pg (ref 26.0–34.0)
MCHC: 31.5 g/dL (ref 30.0–36.0)
MCV: 94.5 fL (ref 78.0–100.0)
Platelets: 226 10*3/uL (ref 150–400)
RBC: 4.17 MIL/uL (ref 3.87–5.11)
RDW: 14 % (ref 11.5–15.5)
WBC: 8 10*3/uL (ref 4.0–10.5)

## 2017-02-05 LAB — BASIC METABOLIC PANEL
Anion gap: 8 (ref 5–15)
BUN: 13 mg/dL (ref 6–20)
CO2: 34 mmol/L — ABNORMAL HIGH (ref 22–32)
Calcium: 9.3 mg/dL (ref 8.9–10.3)
Chloride: 93 mmol/L — ABNORMAL LOW (ref 101–111)
Creatinine, Ser: 0.86 mg/dL (ref 0.44–1.00)
GFR calc Af Amer: >60 mL/min (ref >60)
GFR calc non Af Amer: >60 mL/min (ref >60)
Glucose, Bld: 102 mg/dL — ABNORMAL HIGH (ref 65–99)
Potassium: 4.4 mmol/L (ref 3.5–5.1)
Sodium: 135 mmol/L (ref 135–145)

## 2017-02-05 MED ORDER — PREDNISONE 20 MG PO TABS
60.0000 mg | ORAL_TABLET | Freq: Every day | ORAL | Status: DC
  Administered 2017-02-06: 60 mg via ORAL
  Filled 2017-02-05: qty 3

## 2017-02-05 NOTE — Progress Notes (Signed)
Patient ID: [name], female   DOB: 1946-01-09, 71 y.o.   MRN: [id number]                                                                PROGRESS NOTE                                                                                                                                                                                                             Patient Demographics:    [name], is a 71 y.o. female, DOB - May 07, 1946, BHA:[id number]  Admit date - 02/03/2017   Admitting Physician Robbie Lis, MD  Outpatient Primary MD for the patient is [name], MD  LOS - 2  Outpatient Specialists     Chief Complaint  Patient presents with  . Shortness of Breath  . Chest Pain       Brief Narrative  71 y.o.femalewith medical history significantfor COPD, on chronic oxygen at home 3-4 L nasal cannula oxygen support, chronic diastolic CHF with last 2-D echo in May 2017 with reserved ejection fraction, dyslipidemia. Patient presented to ED with worsening shortness of breath, congestion, cough occasionally productive of white sputum. She was seen by primary care physician and given apparently doxycycline and she could not tolerate the side effects. Patient was most recently seen by primary care physician 02/02/2017 for nasal congestion, postnasal drip and runny nose. She was given levocetirizne and methylprednisolone her symptoms however progressed. Chest x-ray showed possible pneumonia in the right middle lung lobe. She was started on empiric Levaquin. She was also given nebulizer treatments in ED and Solu-Medrol but continued to have shortness of breath and wheezing for which reason she was admitted for further management of pneumonia and COPD.     Subjective:    [name] today has slight wheezing,  Slight dyspnea.  Pt afebrile overnite.  Pt remains on o2 [place]  No headache, No chest pain, No abdominal pain - No Nausea, No new weakness tingling or numbness,    Assessment  &  Plan :    Principal Problem:   Acute on chronic respiratory failure with hypoxia (HCC) Active Problems:   Chronic diastolic CHF (congestive heart failure) (HCC)   CAD (coronary artery disease), native coronary artery   COPD with acute exacerbation (  [place])   Right middle lobe pneumonia ([place])   Dyslipidemia   Acute on chronic hypoxemic respiratory failure -Uses 3-4L [place] prn during the day, with exertion, and at nighttime  RML CAP  -Azithromax. Pt allergic to rocephin  -Blood cultures 5/10  =>ngtd -Strep pneumo ag pending -Sputum culture pending  -Recommend repeat CXR in 3-4 weeks after resolution of illness to r/o underlying malignancy   Acute COPD exacerbation -Solumedrol, duonebs, antibiotics as above. Taper steroids with patient improvement   Chronic diastolic CHF -Compensated. BNP 167  -Continue Lasix  CAD (coronary artery disease), native coronary artery -Continue aspirin  Dyslipidemia -Continue Pravachol  Hx right breath cancer s/p lumpectomy, radiation -No evidence of recurrence, follow up with PCP. Appreciate Dr. Jana Hakim    DVT prophylaxis: lovenox Code Status: full Family Communication: no family at bedside Disposition Plan: pending improvement    Consultants:   None  Procedures:   None  Lab Results  Component Value Date   PLT 226 02/05/2017    Antibiotics  :  zithromax 5/11=>  Anti-infectives    Start     Dose/Rate Route Frequency Ordered Stop   02/05/17 1530  levofloxacin (LEVAQUIN) IVPB 750 mg  Status:  Discontinued     750 mg 100 mL/hr over 90 Minutes Intravenous Every 48 hours 02/03/17 1816 02/04/17 0843   02/05/17 1530  levofloxacin (LEVAQUIN) IVPB 500 mg  Status:  Discontinued     500 mg 100 mL/hr over 60 Minutes Intravenous Every 48 hours 02/04/17 0843 02/04/17 1130   02/04/17 1800  azithromycin (ZITHROMAX) tablet 500 mg     500 mg Oral Every 24 hours 02/04/17 1130     02/03/17 1500  levofloxacin (LEVAQUIN) IVPB 750 mg       750 mg 100 mL/hr over 90 Minutes Intravenous  Once 02/03/17 1455 02/03/17 1557        Objective:   Vitals:   02/04/17 1559 02/04/17 1939 02/04/17 2007 02/05/17 0501  BP:  116/73  126/66  Pulse: 91 92  74  Resp: 18 20  20   Temp:  98.2 F (36.8 C)  97.9 F (36.6 C)  TempSrc:  Oral  Oral  SpO2: 100% 95% 92% 97%  Weight:      Height:        Wt Readings from Last 3 Encounters:  02/03/17 57.2 kg (126 lb 3.2 oz)  02/02/17 56.6 kg (124 lb 12 oz)  01/28/17 56.4 kg (124 lb 4.8 oz)     Intake/Output Summary (Last 24 hours) at 02/05/17 0900 Last data filed at 02/05/17 0833  Gross per 24 hour  Intake             1070 ml  Output                0 ml  Net             1070 ml     Physical Exam  Awake Alert, Oriented X 3, No new F.N deficits, Normal affect [place].AT,PERRAL Supple Neck,No JVD, No cervical lymphadenopathy appriciated.  Symmetrical Chest wall movement, slightly tight,  + bilateral wheezing, no crackles.  RRR,No Gallops,Rubs or new Murmurs, No Parasternal Heave +ve B.Sounds, Abd Soft, No tenderness, No organomegaly appriciated, No rebound - guarding or rigidity. No Cyanosis, Clubbing or edema, No new Rash or bruise     Data Review:    CBC  Recent Labs Lab 02/03/17 1329 02/04/17 0326 02/05/17 0304  WBC 6.4 5.9 8.0  HGB 13.0 11.5* 12.4  HCT 41.8 36.5 39.4  PLT 265 199 226  MCV 96.5 94.6 94.5  MCH 30.0 29.8 29.7  MCHC 31.1 31.5 31.5  RDW 14.5 13.9 14.0  LYMPHSABS 0.6*  --   --   MONOABS 0.1  --   --   EOSABS 0.0  --   --   BASOSABS 0.0  --   --     Chemistries   Recent Labs Lab 02/03/17 1634 02/04/17 0326 02/05/17 0304  NA 137 132* 135  K 4.6 4.0 4.4  CL 96* 95* 93*  CO2 33* 30 34*  GLUCOSE 190* 127* 102*  BUN 8 8 13   CREATININE 0.93 0.83 0.86  CALCIUM 9.6 8.9 9.3  AST 30  --   --   ALT 26  --   --   ALKPHOS 56  --   --   BILITOT 0.4  --   --     ------------------------------------------------------------------------------------------------------------------ No results for input(s): CHOL, HDL, LDLCALC, TRIG, CHOLHDL, LDLDIRECT in the last 72 hours.  Lab Results  Component Value Date   HGBA1C 5.3 10/16/2015   ------------------------------------------------------------------------------------------------------------------ No results for input(s): TSH, T4TOTAL, T3FREE, THYROIDAB in the last 72 hours.  Invalid input(s): FREET3 ------------------------------------------------------------------------------------------------------------------ No results for input(s): VITAMINB12, FOLATE, FERRITIN, TIBC, IRON, RETICCTPCT in the last 72 hours.  Coagulation profile No results for input(s): INR, PROTIME in the last 168 hours.  No results for input(s): DDIMER in the last 72 hours.  Cardiac Enzymes No results for input(s): CKMB, TROPONINI, MYOGLOBIN in the last 168 hours.  Invalid input(s): CK ------------------------------------------------------------------------------------------------------------------    Component Value Date/Time   BNP 167.4 (H) 02/03/2017 1329    Inpatient Medications  Scheduled Meds: . aspirin  325 mg Oral q morning - 10a  . azithromycin  500 mg Oral Q24H  . cholecalciferol  1,000 Units Oral Daily  . enoxaparin (LOVENOX) injection  40 mg Subcutaneous Q24H  . ferrous sulfate  325 mg Oral BID WC  . fluticasone  1 spray Each Nare Daily  . furosemide  40 mg Oral BID  . ipratropium-albuterol  3 mL Nebulization QID  . lactulose  10 g Oral TID  . loratadine  10 mg Oral QHS  . methylPREDNISolone (SOLU-MEDROL) injection  60 mg Intravenous Q12H  . montelukast  10 mg Oral QHS  . nortriptyline  50 mg Oral QHS  . omega-3 acid ethyl esters  1,000 mg Oral q morning - 10a  . pantoprazole  40 mg Oral Daily  . potassium chloride SA  20 mEq Oral Daily  . pravastatin  40 mg Oral q1800  . vitamin C  500 mg Oral  Daily   Continuous Infusions: PRN Meds:.ALPRAZolam, guaiFENesin-dextromethorphan, ipratropium-albuterol  Micro Results Recent Results (from the past 240 hour(s))  Culture, blood (routine x 2) Call MD if unable to obtain prior to antibiotics being given     Status: None (Preliminary result)   Collection Time: 02/03/17  7:02 PM  Result Value Ref Range Status   Specimen Description BLOOD LEFT ANTECUBITAL  Final   Special Requests   Final    BOTTLES DRAWN AEROBIC AND ANAEROBIC Blood Culture adequate volume   Culture NO GROWTH < 24 HOURS  Final   Report Status PENDING  Incomplete  Culture, blood (routine x 2) Call MD if unable to obtain prior to antibiotics being given     Status: None (Preliminary result)   Collection Time: 02/03/17  7:02 PM  Result Value Ref Range Status   Specimen Description BLOOD LEFT ANTECUBITAL  Final   Special Requests IN PEDIATRIC BOTTLE Blood Culture adequate volume  Final   Culture NO GROWTH < 24 HOURS  Final   Report Status PENDING  Incomplete    Radiology Reports Dg Chest 2 View  Result Date: 02/03/2017 CLINICAL DATA:  Dyspnea, left-sided chest pain. EXAM: CHEST  2 VIEW COMPARISON:  Radiographs of March 03, 2016. FINDINGS: Stable cardiomediastinal silhouette. Atherosclerosis of thoracic aorta is noted. No pneumothorax or pleural effusion is noted. Left lung is clear. Focal opacity is seen in the right midlung which may represent focal atelectasis or infiltrate. Bony thorax is unremarkable. IMPRESSION: Aortic atherosclerosis. New focal opacity seen in right midlung concerning for subsegmental atelectasis or infiltrate. Followup PA and lateral chest X-ray is recommended in 3-4 weeks following trial of antibiotic therapy to ensure resolution and exclude underlying malignancy. Electronically Signed   By: Marijo Conception, M.D.   On: 02/03/2017 14:26    Time Spent in minutes  30   Jani Gravel M.D on 02/05/2017 at 9:00 AM  Between 7am to 7pm - Pager -  [phone]  After 7pm go to [email or web address] - password [facility]  [facility] -  Office  [phone]

## 2017-02-06 DIAGNOSIS — J181 Lobar pneumonia, unspecified organism: Principal | ICD-10-CM

## 2017-02-06 MED ORDER — AZITHROMYCIN 250 MG PO TABS: 500.0000 mg | ORAL_TABLET | Freq: Every day | ORAL | 0 refills | Status: DC

## 2017-02-06 MED ORDER — IPRATROPIUM-ALBUTEROL 0.5-2.5 (3) MG/3ML IN SOLN
3.0000 mL | RESPIRATORY_TRACT | 0 refills | Status: DC | PRN
Start: 2017-02-06 — End: 2018-07-03

## 2017-02-06 MED ORDER — PREDNISONE 10 MG PO TABS: ORAL_TABLET | ORAL | 0 refills | Status: DC

## 2017-02-06 MED ORDER — IPRATROPIUM-ALBUTEROL 0.5-2.5 (3) MG/3ML IN SOLN: 3.0000 mL | Freq: Four times a day (QID) | RESPIRATORY_TRACT | 0 refills | Status: DC

## 2017-02-06 MED ORDER — AZITHROMYCIN 250 MG PO TABS: 250.0000 mg | ORAL_TABLET | Freq: Every day | ORAL | 0 refills | Status: AC

## 2017-02-06 NOTE — Discharge Summary (Signed)
[name], is a 71 y.o. female  DOB [birth year]  MRN [id number].  Admission date:  02/03/2017  Admitting Physician  Robbie Lis, MD  Discharge Date:  02/06/2017   Primary MD  [name], MD  Recommendations for primary care physician for things to follow:     Acute on chronic hypoxemic respiratory failure secondary to RML CAP w Copd exacerbation -Uses 3-4L [place] prn during the day, with exertion, and at nighttime  RML CAP  Finish zithromax 250mg  po qday x 3 days -Blood cultures 5/10  =>ngtd -Strep pneumo ag negative -Recommend repeat CXR in 3-4 weeks after resolution of illness to r/o underlying malignancy   Acute COPD exacerbation Prednisone taper  Cont duoneb qid and q4h prn Cont symbicort 2puff bid  Chronic diastolic CHF Compensated. BNP 167  Continue Lasix  CAD (coronary artery disease), native coronary artery Continue aspirin  Dyslipidemia Continue Pravachol  Hx right breath cancer s/p lumpectomy, radiation No evidence of recurrence  Admission Diagnosis  Lobar pneumonia (HCC) [J18.1] Acute exacerbation of chronic obstructive pulmonary disease (COPD) ([place]) [J44.1]   Discharge Diagnosis  Lobar pneumonia (HCC) [J18.1] Acute exacerbation of chronic obstructive pulmonary disease (COPD) ([place]) [J44.1]     Principal Problem:   Acute on chronic respiratory failure with hypoxia (HCC) Active Problems:   Chronic diastolic CHF (congestive heart failure) (HCC)   CAD (coronary artery disease), native coronary artery   Acute exacerbation of chronic obstructive pulmonary disease (COPD) ([place])   Right middle lobe pneumonia ([place])   Dyslipidemia      Past Medical History:  Diagnosis Date  . Abnormal LFTs 08/02/2011  . Acute liver failure 06/19/2013  . Acute renal failure ([place]) 06/19/2013  . Acute respiratory failure ([place]) 06/19/2013  . Altered mental status 08/01/2011  .  Angina   . Anxiety   . Anxiety state, unspecified 12/03/2013  . Aortic stenosis    mild  . Arthritis    "hands" (02/03/2017)  . Breast cancer of upper-outer quadrant of left female breast ([place]) 08/21/2013  . Breast cancer, right breast ([place]) dx'd 2008  . CAD (coronary artery disease) of artery bypass graft 11/06/2013  . CAD (coronary artery disease), MI R/O 08/01/2011   PCI in 2006 (bare metal stent, unknown artery) - [place]   . CHF,  acute diastolic, BNP 4k on admissio 08/01/2011  . Chronic bronchitis ([place])   . Chronic respiratory failure ([place]) 02/02/2012  . Compression fracture of L1 lumbar vertebra ([place]) 02/02/2012  . COPD (chronic obstructive pulmonary disease) (HCC)    oxygen-dependent 4LPM [place]  . Coronary artery disease 2006   2 stents w/previous MI  . Depressed   . Diastolic CHF ([place])   . Dyslipidemia   . Family history of adverse reaction to anesthesia    "daughter had c-section; missed twice w/epidural"  . GERD (gastroesophageal reflux disease)   . Heart murmur   . History of blood transfusion    "w/my colon OR"  . 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  History of nuclear stress test 08/03/2011   attenuation at apex - no perfusion defects   . HTN (hypertension) 11/06/2013  . Hyperkalemia, on ACE prior to admission 11/11/2011  . Hypertension   . Hyponatremia 01/31/2012  . Migraine    "qod to q couple months since I was 21" (02/03/2017)  . Mild aortic stenosis 08/01/2011   AVA 1.69 cm2 (06/02/2011)   . Moderate to severe pulmonary hypertension ([place])   . NSTEMI (non-ST elevated myocardial infarction) ([place]) 2006  . NSVT (nonsustained ventricular tachycardia) (HCC)    h/o  . On home oxygen therapy    "3L just at night; have it available prn" (02/03/2017)  . Pneumonia    "alot of times" (02/03/2017)    Past Surgical History:  Procedure Laterality Date  . BREAST BIOPSY Right 2008  . BREAST LUMPECTOMY Right 2008  . CATARACT EXTRACTION W/ INTRAOCULAR LENS  IMPLANT, BILATERAL Bilateral ~  2013  . [place]; 1971; 1978  . CHOLECYSTECTOMY OPEN  2011  . COLON SURGERY    . COLOSTOMY  11/2007  . COLOSTOMY CLOSURE  07/2008  . CORONARY ANGIOPLASTY WITH STENT PLACEMENT  2006   "2 stents"  . ERCP N/A 09/05/2015   Procedure: ENDOSCOPIC RETROGRADE CHOLANGIOPANCREATOGRAPHY (ERCP);  Surgeon: Ladene Artist, MD;  Location: [facility];  Service: Endoscopy;  Laterality: N/A;  . PARTIAL COLECTOMY  2009   for obstruction: temporary ostomy, later reversed.   Marland Kitchen RIGHT HEART CATHETERIZATION N/A 09/05/2013   Procedure: RIGHT HEART CATH;  Surgeon: Jolaine Artist, MD;  Location: [facility] CATH LAB;  Service: Cardiovascular;  Laterality: N/A;  . TRANSTHORACIC ECHOCARDIOGRAM  11/12/2011   EF 67-67%, normal systolic function, grade 1 diastolic dysfunction; ventricular septal flattening (D-sign); mild AS; trace-mild MR; LA mildly dilated; RV mod dilated; RA mod dilated; severe pulm HTN; elevated CVP       HPI  from the history and physical done on the day of admission:      71 y.o.femalewith medical history significantfor COPD, on chronic oxygen at home 3-4 L nasal cannula oxygen support, chronic diastolic CHF with last 2-D echo in May 2017 with reserved ejection fraction, dyslipidemia. Patient presented to ED with worsening shortness of breath, congestion, cough occasionally productive of white sputum. She was seen by primary care physician and given apparently doxycycline and she could not tolerate the side effects. Patient was most recently seen by primary care physician 02/02/2017 for nasal congestion, postnasal drip and runny nose. She was given levocetirizne and methylprednisolone her symptoms however progressed. Chest x-ray showed possible pneumonia in the right middle lung lobe. She was started on empiric Levaquin. She was also given nebulizer treatments in ED and Solu-Medrol but continued to have shortness of breath and wheezing for which reason she was admitted for further  management of pneumonia and COPD.    Hospital Course:     Pt was admitted for RML pneumonia and Copd exacerbation.  Pt was started on solumedrol iv, and levaquin iv  5/10, and then transitioned to zithromax on 5/11.  Pt breathing improved. Solumedrol was tapered.  Cardiac echo 5/10=> EF 60-65%, mild AS. Pt BNP was only 167 so continued on current lasix dose. Pt breathing today, much improved. Pt feels stable and would like to go home today.     Follow UP  Follow-up Information    [name], MD Follow up in 1 week(s).   Specialty:  Internal Medicine Contact information: [address]  [address] [phone]            Consults obtained - none  Discharge Condition: stable  Diet and Activity recommendation: See Discharge Instructions below  Discharge Instructions         Discharge Medications     Allergies as of 02/06/2017      Reactions   Ceftriaxone Anaphylaxis, Other (See Comments)   *ROCEPHIN*  "Blow up like a balloon"   Hydroxyzine Shortness Of Breath, Other (See Comments)   Pt states med make her light headed, get sob sxs   Doxycycline Nausea Only   Lexapro [escitalopram] Other (See Comments)   Pt states med make her dizzy      Medication List    STOP taking these medications   methylPREDNISolone 4 MG Tbpk tablet Commonly known as:  MEDROL DOSEPAK     TAKE these medications   ALPRAZolam 1 MG tablet Commonly known as:  XANAX TAKE 1 TABLET BY MOUTH TWICE A DAY AS NEEDED   aspirin 325 MG EC tablet Take 1 tablet (325 mg total) by mouth every morning.   ATROVENT HFA 17 MCG/ACT inhaler Generic drug:  ipratropium USE 2 PUFFS EVERY FOUR HOURS AS NEEDED   azithromycin 250 MG tablet Commonly known as:  ZITHROMAX Take 1 tablet (250 mg total) by mouth daily. Take 1 tablet daily for 3 days.   butalbital-acetaminophen-caffeine 50-325-40 MG tablet Commonly known as:  FIORICET, ESGIC TAKE 1 TO 2 TABLETS BY MOUTH AS NEEDED  FOR HEADACHE   cholecalciferol 1000 units tablet Commonly known as:  VITAMIN D Take 1,000 Units by mouth daily.   diclofenac sodium 1 % Gel Commonly known as:  VOLTAREN APPLY 4 G TOPICALLY DAILY AS NEEDED (APPLY TO HANDS). HAND PAIN   DRY EYES OP Apply 2 drops to eye daily as needed.   ferrous sulfate 325 (65 FE) MG tablet Take 1 tablet (325 mg total) by mouth 2 (two) times daily with a meal.   fluticasone 50 MCG/ACT nasal spray Commonly known as:  FLONASE USE 2 SPRAYS INTO EACH NOSTRIL ONCE DAILY**REPEAT FOR 5 DAYS THEN STOP   furosemide 40 MG tablet Commonly known as:  LASIX TAKE 1 TABLET BY MOUTH TWICE A DAY   ipratropium-albuterol 0.5-2.5 (3) MG/3ML Soln Commonly known as:  DUONEB Take 3 mLs by nebulization 4 (four) times daily. What changed:  how much to take  how to take this  when to take this  additional instructions   ipratropium-albuterol 0.5-2.5 (3) MG/3ML Soln Commonly known as:  DUONEB Take 3 mLs by nebulization every 4 (four) hours as needed. What changed:  You were already taking a medication with the same name, and this prescription was added. Make sure you understand how and when to take each.   KLOR-CON M20 20 MEQ tablet Generic drug:  potassium chloride SA TAKE 1 TABLET BY MOUTH EVERY DAY   lactulose 10 GM/15ML solution Commonly known as:  CHRONULAC Take 30 g by mouth 3 (three) times daily.   levocetirizine 5 MG tablet Commonly known as:  XYZAL Take 1 tablet (5 mg total) by mouth every evening.   LIVALO 2 MG Tabs Generic drug:  Pitavastatin Calcium Take 1 tablet by mouth every evening.   montelukast 10 MG tablet Commonly known as:  SINGULAIR Take 1 tablet (10 mg total) by mouth at bedtime.   nortriptyline 25 MG capsule Commonly known as:  PAMELOR Take 2 capsules (50 mg total) by mouth at bedtime.   Omega 3 1200 MG Caps Take  1,200 mg by mouth every morning.   ondansetron 8 MG tablet Commonly known as:  ZOFRAN TAKE 1 TABLET BY  MOUTH TWICE A DAY AS NEEDED FOR NAUSEA OR VOMITING   OXYGEN Inhale 3 L into the lungs See admin instructions. Uses when needed during the day, uses continuous throughout the night   pantoprazole 40 MG tablet Commonly known as:  PROTONIX TAKE 1 TABLET BY MOUTH ONCE A DAY   predniSONE 10 MG tablet Commonly known as:  DELTASONE 60mg  po qday x 2 days then 50mg  po qday x 2 days then 40mg  po qday x 2 days then 30mg  po qday x 2 days then 20mg  po qday x 2 days then 10mg  po qday x 2 days   SYMBICORT 160-4.5 MCG/ACT inhaler Generic drug:  budesonide-formoterol INHALE 2 PUFFS INTO THE LUNGS 2 (TWO) TIMES DAILY.   VENTOLIN HFA 108 (90 Base) MCG/ACT inhaler Generic drug:  albuterol INHALE 2 PUFFS INTO THE LUNGS EVERY 4 HOURS AS NEEDED   vitamin C 500 MG tablet Commonly known as:  ASCORBIC ACID Take 500 mg by mouth daily.       Major procedures and Radiology Reports - PLEASE review detailed and final reports for all details, in brief -      Dg Chest 2 View  Result Date: 02/03/2017 CLINICAL DATA:  Dyspnea, left-sided chest pain. EXAM: CHEST  2 VIEW COMPARISON:  Radiographs of March 03, 2016. FINDINGS: Stable cardiomediastinal silhouette. Atherosclerosis of thoracic aorta is noted. No pneumothorax or pleural effusion is noted. Left lung is clear. Focal opacity is seen in the right midlung which may represent focal atelectasis or infiltrate. Bony thorax is unremarkable. IMPRESSION: Aortic atherosclerosis. New focal opacity seen in right midlung concerning for subsegmental atelectasis or infiltrate. Followup PA and lateral chest X-ray is recommended in 3-4 weeks following trial of antibiotic therapy to ensure resolution and exclude underlying malignancy. Electronically Signed   By: Marijo Conception, M.D.   On: 02/03/2017 14:26    Micro Results     Recent Results (from the past 240 hour(s))  Culture, blood (routine x 2) Call MD if unable to obtain prior to antibiotics being given     Status:  None (Preliminary result)   Collection Time: 02/03/17  7:02 PM  Result Value Ref Range Status   Specimen Description BLOOD LEFT ANTECUBITAL  Final   Special Requests   Final    BOTTLES DRAWN AEROBIC AND ANAEROBIC Blood Culture adequate volume   Culture NO GROWTH 3 DAYS  Final   Report Status PENDING  Incomplete  Culture, blood (routine x 2) Call MD if unable to obtain prior to antibiotics being given     Status: None (Preliminary result)   Collection Time: 02/03/17  7:02 PM  Result Value Ref Range Status   Specimen Description BLOOD LEFT ANTECUBITAL  Final   Special Requests IN PEDIATRIC BOTTLE Blood Culture adequate volume  Final   Culture NO GROWTH 3 DAYS  Final   Report Status PENDING  Incomplete       Today   Subjective    [name] today feels much better in terms of breathing. Afebrile.  Slight wheezing.  no headache,no chest abdominal pain,no new weakness tingling or numbness, feels much better wants to go home today.    Objective   Blood pressure (!) 103/55, pulse 78, temperature 97.5 F (36.4 C), temperature source Oral, resp. rate 18, height 5\' 2"  (1.575 m), weight 57.2 kg (126 lb 3.2 oz), SpO2 91 %.  No intake or output data in the 24 hours ending 02/06/17 1202  Exam Awake Alert, Oriented x 3, No new F.N deficits, Normal affect [place].AT,PERRAL Supple Neck,No JVD, No cervical lymphadenopathy appriciated.  Symmetrical Chest wall movement, Good air movement bilaterally, slight exp wheezing , much improved. No crackles.  RRR, S1, S2 1/6 sem rusb, No Parasternal Heave +ve B.Sounds, Abd Soft, Non tender, No organomegaly appriciated, No rebound -guarding or rigidity. No Cyanosis, Clubbing or edema, No new Rash or bruise   Data Review   CBC w Diff:  Lab Results  Component Value Date   WBC 8.0 02/05/2017   HGB 12.4 02/05/2017   HGB 14.4 06/17/2014   HCT 39.4 02/05/2017   HCT 44.4 06/17/2014   PLT 226 02/05/2017   PLT 253 06/17/2014   LYMPHOPCT 10 02/03/2017     LYMPHOPCT 23.9 06/17/2014   MONOPCT 2 02/03/2017   MONOPCT 6.9 06/17/2014   EOSPCT 0 02/03/2017   EOSPCT 2.4 06/17/2014   BASOPCT 0 02/03/2017   BASOPCT 1.3 06/17/2014    CMP:  Lab Results  Component Value Date   NA 135 02/05/2017   NA 140 06/17/2014   K 4.4 02/05/2017   K 4.3 06/17/2014   CL 93 (L) 02/05/2017   CO2 34 (H) 02/05/2017   CO2 31 (H) 06/17/2014   BUN 13 02/05/2017   BUN 12.8 06/17/2014   CREATININE 0.86 02/05/2017   CREATININE 1.1 06/17/2014   PROT 7.5 02/03/2017   PROT 7.9 06/17/2014   ALBUMIN 3.2 (L) 02/03/2017   ALBUMIN 3.6 06/17/2014   BILITOT 0.4 02/03/2017   BILITOT 0.34 06/17/2014   ALKPHOS 56 02/03/2017   ALKPHOS 65 06/17/2014   AST 30 02/03/2017   AST 22 06/17/2014   ALT 26 02/03/2017   ALT 18 06/17/2014  .   Total Time in preparing paper work, data evaluation and todays exam - 61 minutes  Jani Gravel M.D on 02/06/2017 at 12:02 PM  [facility]   Office  [phone]

## 2017-02-08 ENCOUNTER — Telehealth: Payer: Self-pay | Admitting: *Deleted

## 2017-02-08 LAB — CULTURE, BLOOD (ROUTINE X 2)
Culture: NO GROWTH
Culture: NO GROWTH
Special Requests: ADEQUATE
Special Requests: ADEQUATE

## 2017-02-08 NOTE — Telephone Encounter (Signed)
Tried calling pt to get TCM appt set-up no answer LMOM RTC...[name]

## 2017-02-10 NOTE — Telephone Encounter (Signed)
Tried calling pt several time pt still not answering have left several msg's & no return call back closing phone note...[name]

## 2017-02-10 NOTE — Telephone Encounter (Signed)
Called pt again still no answer LMOM RTC.../lmb 

## 2017-02-15 ENCOUNTER — Ambulatory Visit (HOSPITAL_COMMUNITY): Payer: Medicare Other | Attending: Cardiovascular Disease

## 2017-02-15 ENCOUNTER — Other Ambulatory Visit: Payer: Self-pay

## 2017-02-15 DIAGNOSIS — I350 Nonrheumatic aortic (valve) stenosis: Secondary | ICD-10-CM | POA: Diagnosis not present

## 2017-02-22 ENCOUNTER — Ambulatory Visit
Admission: RE | Admit: 2017-02-22 | Discharge: 2017-02-22 | Disposition: A | Discharge status: Home / Self Care | Payer: Medicare Other | Source: Ambulatory Visit | Attending: Internal Medicine | Admitting: Internal Medicine

## 2017-02-22 ENCOUNTER — Other Ambulatory Visit: Payer: Self-pay

## 2017-02-22 DIAGNOSIS — Z1231 Encounter for screening mammogram for malignant neoplasm of breast: Secondary | ICD-10-CM

## 2017-02-22 LAB — HM MAMMOGRAPHY

## 2017-02-22 NOTE — Patient Outreach (Signed)
[name] Care Management  02/22/2017  [name] 1946-01-13 [id number]   EMMI:  Pneumonia and Heart failure Referral date: 02/22/17 Referral source: EMMI pneumonia and EMMI heart failure Referral reason: Weighed themselves: NO Swelling in hands/ feet or change in weight: YES Day # 9 for heart failure, Day #10 for pneumonia  Telephone call to patient regarding dual EMM calls.  Unable to reach patient at listed home number and listed alternate number. HIPAA compliant voice message left with call back phone number.   PLAN; RNCM will attempt 2nd telephone outreach to patient within 3 business days.   [name] RN,BSN,CCM [facility] Telephonic  [phone]     PLAN:

## 2017-02-24 ENCOUNTER — Other Ambulatory Visit: Payer: Self-pay

## 2017-02-24 ENCOUNTER — Ambulatory Visit (INDEPENDENT_AMBULATORY_CARE_PROVIDER_SITE_OTHER): Payer: Medicare Other | Admitting: Internal Medicine

## 2017-02-24 ENCOUNTER — Ambulatory Visit (INDEPENDENT_AMBULATORY_CARE_PROVIDER_SITE_OTHER)
Admission: RE | Admit: 2017-02-24 | Discharge: 2017-02-24 | Disposition: A | Discharge status: Home / Self Care | Payer: Medicare Other | Source: Ambulatory Visit | Attending: Internal Medicine | Admitting: Internal Medicine

## 2017-02-24 ENCOUNTER — Encounter: Payer: Self-pay | Admitting: Internal Medicine

## 2017-02-24 VITALS — BP 124/68 | HR 80 | Temp 97.8°F | Resp 20 | Ht 62.0 in | Wt 122.0 lb

## 2017-02-24 DIAGNOSIS — I5032 Chronic diastolic (congestive) heart failure: Secondary | ICD-10-CM | POA: Diagnosis not present

## 2017-02-24 DIAGNOSIS — J449 Chronic obstructive pulmonary disease, unspecified: Secondary | ICD-10-CM

## 2017-02-24 DIAGNOSIS — Z8701 Personal history of pneumonia (recurrent): Secondary | ICD-10-CM

## 2017-02-24 DIAGNOSIS — J13 Pneumonia due to Streptococcus pneumoniae: Secondary | ICD-10-CM | POA: Diagnosis not present

## 2017-02-24 DIAGNOSIS — I350 Nonrheumatic aortic (valve) stenosis: Secondary | ICD-10-CM

## 2017-02-24 DIAGNOSIS — R918 Other nonspecific abnormal finding of lung field: Secondary | ICD-10-CM | POA: Diagnosis not present

## 2017-02-24 NOTE — Progress Notes (Signed)
Subjective:  Patient ID: [name], female    DOB: Jul 10, 1946  Age: 71 y.o. MRN: [id number]  CC: Cough   HPI [name] presents for f/up after a recent admission for COPD exacerbation and pneumonia. She feels much better. She's had no cough recently and has her baseline level of shortness of breath. She denies chest pain, hemoptysis, fever, chills, or night sweats.  Outpatient Medications Prior to Visit  Medication Sig Dispense Refill  . ALPRAZolam (XANAX) 1 MG tablet TAKE 1 TABLET BY MOUTH TWICE A DAY AS NEEDED 60 tablet 2  . Artificial Tear Ointment (DRY EYES OP) Apply 2 drops to eye daily as needed.    Marland Kitchen aspirin 325 MG EC tablet Take 1 tablet (325 mg total) by mouth every morning.    . ATROVENT HFA 17 MCG/ACT inhaler USE 2 PUFFS EVERY FOUR HOURS AS NEEDED 77.4 Inhaler 11  . butalbital-acetaminophen-caffeine (FIORICET, ESGIC) 50-325-40 MG tablet TAKE 1 TO 2 TABLETS BY MOUTH AS NEEDED FOR HEADACHE 60 tablet 5  . cholecalciferol (VITAMIN D) 1000 units tablet Take 1,000 Units by mouth daily.    . diclofenac sodium (VOLTAREN) 1 % GEL APPLY 4 G TOPICALLY DAILY AS NEEDED (APPLY TO HANDS). HAND PAIN  2  . ferrous sulfate 325 (65 FE) MG tablet Take 1 tablet (325 mg total) by mouth 2 (two) times daily with a meal. 180 tablet 1  . fluticasone (FLONASE) 50 MCG/ACT nasal spray USE 2 SPRAYS INTO EACH NOSTRIL ONCE DAILY**REPEAT FOR 5 DAYS THEN STOP 16 g 11  . furosemide (LASIX) 40 MG tablet TAKE 1 TABLET BY MOUTH TWICE A DAY 180 tablet 1  . ipratropium-albuterol (DUONEB) 0.5-2.5 (3) MG/3ML SOLN Take 3 mLs by nebulization every 4 (four) hours as needed. 360 mL 0  . KLOR-CON M20 20 MEQ tablet TAKE 1 TABLET BY MOUTH EVERY DAY 30 tablet 5  . lactulose (CHRONULAC) 10 GM/15ML solution Take 30 g by mouth 3 (three) times daily.    Marland Kitchen levocetirizine (XYZAL) 5 MG tablet Take 1 tablet (5 mg total) by mouth every evening. 90 tablet 3  . LIVALO 2 MG TABS Take 1 tablet by mouth every evening.   3  .  montelukast (SINGULAIR) 10 MG tablet Take 1 tablet (10 mg total) by mouth at bedtime. 30 tablet 11  . nortriptyline (PAMELOR) 25 MG capsule Take 2 capsules (50 mg total) by mouth at bedtime. 180 capsule 1  . Omega 3 1200 MG CAPS Take 1,200 mg by mouth every morning.     . ondansetron (ZOFRAN) 8 MG tablet TAKE 1 TABLET BY MOUTH TWICE A DAY AS NEEDED FOR NAUSEA OR VOMITING 30 tablet 0  . OXYGEN-HELIUM IN Inhale 3 L into the lungs See admin instructions. Uses when needed during the day, uses continuous throughout the night    . pantoprazole (PROTONIX) 40 MG tablet TAKE 1 TABLET BY MOUTH ONCE A DAY 90 tablet 3  . predniSONE (DELTASONE) 10 MG tablet 60mg  po qday x 2 days then 50mg  po qday x 2 days then 40mg  po qday x 2 days then 30mg  po qday x 2 days then 20mg  po qday x 2 days then 10mg  po qday x 2 days 42 tablet 0  . SYMBICORT 160-4.5 MCG/ACT inhaler INHALE 2 PUFFS INTO THE LUNGS 2 (TWO) TIMES DAILY. 10.2 Inhaler 11  . VENTOLIN HFA 108 (90 Base) MCG/ACT inhaler INHALE 2 PUFFS INTO THE LUNGS EVERY 4 HOURS AS NEEDED 36 Inhaler 5  . vitamin C (  ASCORBIC ACID) 500 MG tablet Take 500 mg by mouth daily.    Marland Kitchen ipratropium-albuterol (DUONEB) 0.5-2.5 (3) MG/3ML SOLN Take 3 mLs by nebulization 4 (four) times daily. 360 mL 0   No facility-administered medications prior to visit.     ROS Review of Systems  Constitutional: Negative for chills, diaphoresis, fatigue and fever.  HENT: Negative.  Negative for trouble swallowing.   Eyes: Negative for visual disturbance.  Respiratory: Positive for shortness of breath. Negative for cough, chest tightness and wheezing.   Cardiovascular: Negative for chest pain, palpitations and leg swelling.  Gastrointestinal: Negative for abdominal pain, constipation, diarrhea, nausea and vomiting.  Endocrine: Negative.   Genitourinary: Negative.  Negative for difficulty urinating, dysuria and frequency.  Musculoskeletal: Negative.  Negative for back pain, joint swelling and  myalgias.  Skin: Negative.  Negative for color change and pallor.  Allergic/Immunologic: Negative.   Neurological: Negative.  Negative for dizziness.  Hematological: Negative for adenopathy. Does not bruise/bleed easily.  Psychiatric/Behavioral: Negative.     Objective:  BP 124/68 (BP Location: Left Arm, Patient Position: Sitting, Cuff Size: Normal)   Pulse 80   Temp 97.8 F (36.6 C) (Oral)   Resp 20   Ht 5\' 2"  (1.575 m)   Wt 122 lb (55.3 kg)   SpO2 98%   BMI 22.31 kg/m   BP Readings from Last 3 Encounters:  02/24/17 124/68  02/06/17 (!) 103/55  02/02/17 100/60    Wt Readings from Last 3 Encounters:  02/24/17 122 lb (55.3 kg)  02/24/17 122 lb (55.3 kg)  02/03/17 126 lb 3.2 oz (57.2 kg)    Physical Exam  Constitutional: She is oriented to person, place, and time. No distress.  HENT:  Mouth/Throat: Oropharynx is clear and moist. No oropharyngeal exudate.  Eyes: Conjunctivae are normal. Right eye exhibits no discharge. Left eye exhibits no discharge. No scleral icterus.  Neck: Normal range of motion. Neck supple. No JVD present. No thyromegaly present.  Cardiovascular: Regular rhythm.  Exam reveals no gallop.   No murmur heard. Pulmonary/Chest: No accessory muscle usage. Tachypnea noted. No respiratory distress. She has no decreased breath sounds. She has wheezes in the right middle field and the left middle field. She has rhonchi in the right middle field and the left middle field. She has no rales.  She has diffuse, bilateral, inspiratory wheezes and rhonchi but good air movement and no rails  Abdominal: Soft. Bowel sounds are normal. She exhibits no distension and no mass. There is no tenderness. There is no rebound and no guarding.  Musculoskeletal: Normal range of motion. She exhibits no edema, tenderness or deformity.  Lymphadenopathy:    She has no cervical adenopathy.  Neurological: She is alert and oriented to person, place, and time.  Skin: Skin is warm and dry.  No rash noted. She is not diaphoretic. No erythema. No pallor.  Vitals reviewed.   Lab Results  Component Value Date   WBC 8.0 02/05/2017   HGB 12.4 02/05/2017   HCT 39.4 02/05/2017   PLT 226 02/05/2017   GLUCOSE 102 (H) 02/05/2017   CHOL 186 12/21/2016   TRIG 85.0 12/21/2016   HDL 72.70 12/21/2016   LDLCALC 97 12/21/2016   ALT 26 02/03/2017   AST 30 02/03/2017   NA 135 02/05/2017   K 4.4 02/05/2017   CL 93 (L) 02/05/2017   CREATININE 0.86 02/05/2017   BUN 13 02/05/2017   CO2 34 (H) 02/05/2017   TSH 1.17 12/21/2016   INR 0.87 09/05/2013  HGBA1C 5.3 10/16/2015    Mm Screening Breast Tomo Bilateral  Result Date: 02/22/2017 CLINICAL DATA:  Screening. EXAM: 2D DIGITAL SCREENING BILATERAL MAMMOGRAM WITH CAD AND ADJUNCT TOMO COMPARISON:  Previous exam(s). ACR Breast Density Category b: There are scattered areas of fibroglandular density. FINDINGS: There are no findings suspicious for malignancy. Images were processed with CAD. IMPRESSION: No mammographic evidence of malignancy. A result letter of this screening mammogram will be mailed directly to the patient. RECOMMENDATION: Screening mammogram in one year. (Code:SM-B-01Y) BI-RADS CATEGORY  1: Negative. Electronically Signed   By: Lajean Manes M.D.   On: 02/22/2017 17:03    Assessment & Plan:   Itzamara was seen today for cough.  Diagnoses and all orders for this visit:  Pneumonia of right middle lobe due to Streptococcus pneumoniae [facility])- Based on her symptoms, exam, and chest x-ray the pneumonia has resolved. -     DG Chest 2 View; Future  Chronic diastolic CHF (congestive heart failure) ([place])- she appears euvolemic today but does have shortness of breath and abnormal chest x-ray which could either be related to her severe lung disease or fluid retention from diastolic dysfunction. She had an echo done a couple weeks ago that showed mild aortic stenosis and normal EF. She knows to seek emergent medical treatment if she develops  signs of fluid overload.   I am having Ms. Petti maintain her OXYGEN-HELIUM IN, Omega 3, aspirin, diclofenac sodium, LIVALO, cholecalciferol, Artificial Tear Ointment (DRY EYES OP), vitamin C, fluticasone, ATROVENT HFA, butalbital-acetaminophen-caffeine, pantoprazole, SYMBICORT, furosemide, VENTOLIN HFA, ferrous sulfate, KLOR-CON M20, ALPRAZolam, nortriptyline, ondansetron, montelukast, levocetirizine, lactulose, ipratropium-albuterol, and predniSONE.  No orders of the defined types were placed in this encounter.    Follow-up: Return if symptoms worsen or fail to improve.  Scarlette Calico, MD

## 2017-02-24 NOTE — Patient Outreach (Signed)
[facility]) Care Management  02/24/2017  [name] 09/27/1946 [id number]   EMMI:  Pneumonia and Heart failure Referral date: 02/22/17 Referral source: EMMI pneumonia and EMMI heart failure Referral reason: Weighed themselves: NO Swelling in hands/ feet or change in weight: YES Day # 9 for heart failure, Day #10 for pneumonia  Telephone call to patient regarding EMMI heart failure and EMMI pneumonia red alert. HIPAA  Verified with patient. Discussed EMMI heart failure and EMMI pneumonia program with patient. Patient refused program follow up. Patient states she is doing fine. Patient reports she saw her doctor today and she is doing fine. Patient reports weight today at 122 lbs. Patient states she weighs daily. Patient request all calls for both EMMI heart failure and EMMI pneumonia program be discontinued. Patient states she is not going to answer the calls so she wants them stopped. Patient denies having any further needs at this time.  RNCM advised patient to notify MD of any changes in condition prior to scheduled appointment. RNCM provided contact name and number: [phone] or main office number [phone] and 24 hour nurse advise line [phone].  RNCM verified patient aware of 911 services for urgent/ emergent needs.  PLAN: RNCM will refer patient to care management assistant to close due to refusal of services. RNCM will have all EMMI heart failure and EMMI pneumonia calls discontinued. RNCM will notify patients primary MD of closure.   [name] RN,BSN,CCM [facility] Telephonic  [phone]

## 2017-02-24 NOTE — Patient Instructions (Signed)
Cough, Adult Coughing is a reflex that clears your throat and your airways. Coughing helps to heal and protect your lungs. It is normal to cough occasionally, but a cough that happens with other symptoms or lasts a long time may be a sign of a condition that needs treatment. A cough may last only 2-3 weeks (acute), or it may last longer than 8 weeks (chronic). What are the causes? Coughing is commonly caused by:  Breathing in substances that irritate your lungs.  A viral or bacterial respiratory infection.  Allergies.  Asthma.  Postnasal drip.  Smoking.  Acid backing up from the stomach into the esophagus (gastroesophageal reflux).  Certain medicines.  Chronic lung problems, including COPD (or rarely, lung cancer).  Other medical conditions such as heart failure.  Follow these instructions at home: Pay attention to any changes in your symptoms. Take these actions to help with your discomfort:  Take medicines only as told by your health care provider. ? If you were prescribed an antibiotic medicine, take it as told by your health care provider. Do not stop taking the antibiotic even if you start to feel better. ? Talk with your health care provider before you take a cough suppressant medicine.  Drink enough fluid to keep your urine clear or pale yellow.  If the air is dry, use a cold steam vaporizer or humidifier in your bedroom or your home to help loosen secretions.  Avoid anything that causes you to cough at work or at home.  If your cough is worse at night, try sleeping in a semi-upright position.  Avoid cigarette smoke. If you smoke, quit smoking. If you need help quitting, ask your health care provider.  Avoid caffeine.  Avoid alcohol.  Rest as needed.  Contact a health care provider if:  You have new symptoms.  You cough up pus.  Your cough does not get better after 2-3 weeks, or your cough gets worse.  You cannot control your cough with suppressant  medicines and you are losing sleep.  You develop pain that is getting worse or pain that is not controlled with pain medicines.  You have a fever.  You have unexplained weight loss.  You have night sweats. Get help right away if:  You cough up blood.  You have difficulty breathing.  Your heartbeat is very fast. This information is not intended to replace advice given to you by your health care provider. Make sure you discuss any questions you have with your health care provider. Document Released: 03/12/2011 Document Revised: 02/19/2016 Document Reviewed: 11/20/2014 Elsevier Interactive Patient Education  2017 Elsevier Inc.  

## 2017-03-01 ENCOUNTER — Other Ambulatory Visit: Payer: Self-pay | Admitting: Internal Medicine

## 2017-03-01 DIAGNOSIS — E785 Hyperlipidemia, unspecified: Secondary | ICD-10-CM

## 2017-03-03 ENCOUNTER — Other Ambulatory Visit: Payer: Self-pay | Admitting: Internal Medicine

## 2017-03-03 DIAGNOSIS — E785 Hyperlipidemia, unspecified: Secondary | ICD-10-CM

## 2017-03-18 ENCOUNTER — Other Ambulatory Visit: Payer: Self-pay | Admitting: Internal Medicine

## 2017-03-31 ENCOUNTER — Other Ambulatory Visit: Payer: Self-pay | Admitting: Internal Medicine

## 2017-04-06 ENCOUNTER — Other Ambulatory Visit: Payer: Self-pay | Admitting: Internal Medicine

## 2017-04-08 ENCOUNTER — Other Ambulatory Visit: Payer: Self-pay | Admitting: Internal Medicine

## 2017-04-09 ENCOUNTER — Other Ambulatory Visit: Payer: Self-pay | Admitting: Internal Medicine

## 2017-04-09 DIAGNOSIS — J438 Other emphysema: Secondary | ICD-10-CM

## 2017-04-27 ENCOUNTER — Other Ambulatory Visit: Payer: Self-pay | Admitting: Internal Medicine

## 2017-05-05 ENCOUNTER — Other Ambulatory Visit: Payer: Self-pay | Admitting: Internal Medicine

## 2017-05-16 ENCOUNTER — Other Ambulatory Visit: Payer: Self-pay | Admitting: Internal Medicine

## 2017-05-16 MED ORDER — ONDANSETRON HCL 8 MG PO TABS: 8.0000 mg | ORAL_TABLET | Freq: Three times a day (TID) | ORAL | 2 refills | Status: DC | PRN

## 2017-05-22 ENCOUNTER — Other Ambulatory Visit: Payer: Self-pay | Admitting: Internal Medicine

## 2017-05-31 ENCOUNTER — Encounter: Payer: Self-pay | Admitting: Internal Medicine

## 2017-05-31 ENCOUNTER — Other Ambulatory Visit: Payer: Self-pay | Admitting: Internal Medicine

## 2017-06-01 NOTE — Telephone Encounter (Signed)
Can you take a look at the patients request for [name].

## 2017-06-06 ENCOUNTER — Other Ambulatory Visit: Payer: Self-pay | Admitting: Internal Medicine

## 2017-06-06 DIAGNOSIS — E785 Hyperlipidemia, unspecified: Secondary | ICD-10-CM

## 2017-06-15 ENCOUNTER — Telehealth: Payer: Self-pay

## 2017-06-15 NOTE — Telephone Encounter (Signed)
Pt called stating that she has [employer] (Member# [id number]  Issued: 10/21/2016). I told her that we did not have this card in the system. She said that she has an appointment on Monday with Lyman Bishop at [facility] and she will give them the card to scan into the system. She said that she had this insurance the last time she was here for a visit and gave it the the receptionist to be put in but I do not see it in the system.

## 2017-06-15 NOTE — Telephone Encounter (Signed)
Insurance has been verified for prolia injection---patient can get at her earliest convenience, last injection was 11/27/15---patient has estimated copay $215 and $183 deductible to meet in order to get injection---I also left phone number for patient assistance funding through [employer] should patient want to call to see if she qualifies for assistance with copay---that number is [phone] talk with tamara if any questions

## 2017-06-16 ENCOUNTER — Other Ambulatory Visit: Payer: Self-pay | Admitting: Internal Medicine

## 2017-06-16 MED ORDER — POTASSIUM CHLORIDE CRYS ER 20 MEQ PO TBCR: 20.0000 meq | EXTENDED_RELEASE_TABLET | Freq: Every day | ORAL | 3 refills | Status: DC

## 2017-06-20 ENCOUNTER — Ambulatory Visit (INDEPENDENT_AMBULATORY_CARE_PROVIDER_SITE_OTHER): Payer: Medicare Other | Admitting: Internal Medicine

## 2017-06-20 ENCOUNTER — Encounter: Payer: Self-pay | Admitting: Internal Medicine

## 2017-06-20 VITALS — BP 102/63 | HR 87 | Ht 62.0 in | Wt 123.6 lb

## 2017-06-20 DIAGNOSIS — E785 Hyperlipidemia, unspecified: Secondary | ICD-10-CM

## 2017-06-20 DIAGNOSIS — I10 Essential (primary) hypertension: Secondary | ICD-10-CM

## 2017-06-20 DIAGNOSIS — J438 Other emphysema: Secondary | ICD-10-CM | POA: Diagnosis not present

## 2017-06-20 DIAGNOSIS — I350 Nonrheumatic aortic (valve) stenosis: Secondary | ICD-10-CM | POA: Diagnosis not present

## 2017-06-20 DIAGNOSIS — I2510 Atherosclerotic heart disease of native coronary artery without angina pectoris: Secondary | ICD-10-CM

## 2017-06-20 NOTE — Progress Notes (Signed)
OFFICE NOTE  Chief Complaint:  Feeling well  Primary Care Physician: [name], MD  HPI:  [name] a pleasant 71 year old female with a history of COPD and severe pulmonary hypertension. Recently she was put on home oxygen and has been on an oxygen concentrator, which has made a marked improvement in her ability to get around. She has previously seen Dr. Lamonte Sakai with pulmonology; however, that did not go too well and she has basically given up on any further treatments for COPD other than her oxygen. She still uses nebulizers as needed when she is tight, and I recommended Claritin for seasonal allergies today. As you know, she also has mild aortic stenosis and we are continuing to follow that. She is a low-risk stress test in November 2012. Unfortunately she was recently admitted for altered mental status, possible Tylenol overdose, respiratory failure and liver failure. All of which seems to have resolved. She seems to be doing pre-well after discharge.  She returns today for followup. She reports doing fairly well. She has seen Dr. Lake Bells in the interim and he is recommended continue current therapies. She is now seeing a different internist, Dr. Ronnald Ramp. He was recently admitted the hospital for pneumonia and treated accordingly. She had troponins which were negative suggesting they're likely is no significant underlying obstructive coronary disease. She reports she's recovered and is back to her base. She is overdue for repeat lipid profile.  I saw [name] back in the office today. Overall she reports she is doing fairly well. She's not been admitted to the hospital recently. She says she has stable shortness of breath. She denies any chest pain. It should be noted that she does have mild aortic stenosis which will last assessed in 2014.  [name] returns today for follow-up. She reports that she is doing fairly well. Fortune she's not been hospitalized. She has had an  episode of COPD exacerbation which was supported with early antibiotics. She denies any worsening chest pain and has pretty stable shortness of breath on oxygen. She is able to ambulate and do most of her activities without any restrictions.  03/31/2016  [name] was seen back today in the office for follow-up. She seems to be doing fairly well although does use her oxygen almost continuously. Saturation was low today although she had heavy fingernail polish on and therefore the reading may not be accurate as it read 88%. She was on oxygen via a concentrator. Blood pressure was stable at 108/74. Weight is been stable. She recently had a repeat echocardiogram which shows stable mild aortic stenosis and normal LV function with some mild improvement in pulmonary pressures in May. She denies any chest pain. Fortune that she's not been admitted to the hospital recently for any pneumonia but did have problems with recent gallbladder disease and had cholecystectomy.  12/17/2016  [name] returns today for follow-up. She's done well over the last year has not been hospitalized. She reports stable shortness of breath. She had mild aortic stenosis which is stable by echo last year and will need to be repeated in May of this year. Her last lipid profile showed total cholesterol 197, HDL C 78, triglycerides 100, an LDL-C 99. We'll need to repeat her lipid profile as well.  06/20/2017  I saw [name] back today for follow-up. She is doing well. We repeated on echo in 01/2017, this demonstrated normal LVEF 60-65%, mild stable aortic stenosis. She denies any worsening shortness of breath or chest  pain. Cholesterol remains elevated. Will plan to recheck that today. She could conceivably increase her Livalo. She reports having had her flu vaccine this year.  PMHx:  Past Medical History:  Diagnosis Date  . Abnormal LFTs 08/02/2011  . Acute liver failure 06/19/2013  . Acute renal failure ([place]) 06/19/2013  . Acute respiratory  failure ([place]) 06/19/2013  . Altered mental status 08/01/2011  . Angina   . Anxiety   . Anxiety state, unspecified 12/03/2013  . Aortic stenosis    mild  . Arthritis    "hands" (02/03/2017)  . Breast cancer of upper-outer quadrant of left female breast ([place]) 08/21/2013  . Breast cancer, right breast ([place]) dx'd 2008  . CAD (coronary artery disease) of artery bypass graft 11/06/2013  . CAD (coronary artery disease), MI R/O 08/01/2011   PCI in 2006 (bare metal stent, unknown artery) - [place]   . CHF,  acute diastolic, BNP 4k on admissio 08/01/2011  . Chronic bronchitis ([place])   . Chronic respiratory failure ([place]) 02/02/2012  . Compression fracture of L1 lumbar vertebra ([place]) 02/02/2012  . COPD (chronic obstructive pulmonary disease) (HCC)    oxygen-dependent 4LPM [place]  . Coronary artery disease 2006   2 stents w/previous MI  . Depressed   . Diastolic CHF ([place])   . Dyslipidemia   . Family history of adverse reaction to anesthesia    "daughter had c-section; missed twice w/epidural"  . GERD (gastroesophageal reflux disease)   . Heart murmur   . History of blood transfusion    "w/my colon OR"  . 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 History of nuclear stress test 08/03/2011   attenuation at apex - no perfusion defects   . HTN (hypertension) 11/06/2013  . Hyperkalemia, on ACE prior to admission 11/11/2011  . Hypertension   . Hyponatremia 01/31/2012  . Migraine    "qod to q couple months since I was 21" (02/03/2017)  . Mild aortic stenosis 08/01/2011   AVA 1.69 cm2 (06/02/2011)   . Moderate to severe pulmonary hypertension ([place])   . NSTEMI (non-ST elevated myocardial infarction) ([place]) 2006  . NSVT (nonsustained ventricular tachycardia) (HCC)    h/o  . On home oxygen therapy    "3L just at night; have it available prn" (02/03/2017)  . Pneumonia    "alot of times" (02/03/2017)    Past Surgical History:  Procedure Laterality Date  . BREAST BIOPSY Right 2008  . BREAST LUMPECTOMY Right 2008    malignant  . CATARACT EXTRACTION W/ INTRAOCULAR LENS  IMPLANT, BILATERAL Bilateral ~ 2013  . [place]; 1971; 1978  . CHOLECYSTECTOMY OPEN  2011  . COLON SURGERY    . COLOSTOMY  11/2007  . COLOSTOMY CLOSURE  07/2008  . CORONARY ANGIOPLASTY WITH STENT PLACEMENT  2006   "2 stents"  . ERCP N/A 09/05/2015   Procedure: ENDOSCOPIC RETROGRADE CHOLANGIOPANCREATOGRAPHY (ERCP);  Surgeon: Ladene Artist, MD;  Location: [facility];  Service: Endoscopy;  Laterality: N/A;  . PARTIAL COLECTOMY  2009   for obstruction: temporary ostomy, later reversed.   Marland Kitchen RIGHT HEART CATHETERIZATION N/A 09/05/2013   Procedure: RIGHT HEART CATH;  Surgeon: Jolaine Artist, MD;  Location: [facility] CATH LAB;  Service: Cardiovascular;  Laterality: N/A;  . TRANSTHORACIC ECHOCARDIOGRAM  11/12/2011   EF 19-14%, normal systolic function, grade 1 diastolic dysfunction; ventricular septal flattening (D-sign); mild AS; trace-mild MR; LA mildly dilated; RV mod dilated; RA mod dilated; severe pulm HTN; elevated CVP    FAMHx:  Family History  Problem Relation Age of Onset  . Schizophrenia Sister   . Alzheimer's disease Mother   . Hyperlipidemia Brother   . Heart disease Sister   . Diabetes Sister   . Cancer Neg Hx   . Stroke Neg Hx   . COPD Neg Hx   . Depression Neg Hx   . Drug abuse Neg Hx   . Early death Neg Hx   . Hypertension Neg Hx   . Kidney disease Neg Hx     SOCHx:   reports that she has been smoking Cigarettes.  She has a 5.20 pack-year smoking history. She has never used smokeless tobacco. She reports that she drinks about 0.6 oz of alcohol per week . She reports that she does not use drugs.  ALLERGIES:  Allergies  Allergen Reactions  . Ceftriaxone Anaphylaxis and Other (See Comments)    *ROCEPHIN*  "Blow up like a balloon"  . Hydroxyzine Shortness Of Breath and Other (See Comments)    Pt states med make her light headed, get sob sxs  . Doxycycline Nausea And Vomiting  . Lexapro [Escitalopram]  Other (See Comments)    Pt states med make her dizzy    ROS: Pertinent items noted in HPI and remainder of comprehensive ROS otherwise negative.  HOME MEDS: Current Outpatient Prescriptions  Medication Sig Dispense Refill  . albuterol (VENTOLIN HFA) 108 (90 Base) MCG/ACT inhaler INHALE 2 PUFFS INTO THE LUNGS EVERY 4 HOURS AS NEEDED 36 Inhaler 5  . ALPRAZolam (XANAX) 1 MG tablet TAKE 1 TABLET BY MOUTH TWICE A DAY AS NEEDED 60 tablet 2  . Artificial Tear Ointment (DRY EYES OP) Apply 2 drops to eye daily as needed.    Marland Kitchen aspirin 325 MG EC tablet Take 1 tablet (325 mg total) by mouth every morning.    . ATROVENT HFA 17 MCG/ACT inhaler USE 2 PUFFS INTO THE LUNGS EVERY 4 HOURS AS NEEDED 77.4 Inhaler 5  . butalbital-acetaminophen-caffeine (FIORICET, ESGIC) 50-325-40 MG tablet TAKE 1 TO 2 TABLETS BY MOUTH AS NEEDED FOR HEADACHE 60 tablet 5  . Calcium Carbonate (CALCIUM 600 PO) Take by mouth daily.    . cholecalciferol (VITAMIN D) 1000 units tablet Take 1,000 Units by mouth daily.    . diclofenac sodium (VOLTAREN) 1 % GEL APPLY 4 G TOPICALLY DAILY AS NEEDED (APPLY TO HANDS). HAND PAIN  2  . ferrous sulfate 325 (65 FE) MG tablet Take 1 tablet (325 mg total) by mouth 2 (two) times daily with a meal. 180 tablet 1  . fluticasone (FLONASE) 50 MCG/ACT nasal spray USE 2 SPRAYS INTO EACH NOSTRIL ONCE DAILY**REPEAT FOR 5 DAYS THEN STOP 16 g 11  . furosemide (LASIX) 40 MG tablet TAKE 1 TABLET BY MOUTH TWICE A DAY 180 tablet 1  . ipratropium-albuterol (DUONEB) 0.5-2.5 (3) MG/3ML SOLN Take 3 mLs by nebulization every 4 (four) hours as needed. 360 mL 0  . lactulose, encephalopathy, (CHRONULAC) 10 GM/15ML SOLN TAKE 30MLS BY MOUTH 3 TIMES A DAY AS NEEDED FOR CONSTIPATION 1892 mL 1  . levocetirizine (XYZAL) 5 MG tablet Take 1 tablet (5 mg total) by mouth every evening. 90 tablet 3  . LIVALO 2 MG TABS TAKE 1 TABLET BY MOUTH EVERY DAY 90 tablet 1  . montelukast (SINGULAIR) 10 MG tablet Take 1 tablet (10 mg total) by  mouth at bedtime. 30 tablet 11  . nortriptyline (PAMELOR) 25 MG capsule Take 2 capsules (50 mg total) by mouth at bedtime. 180 capsule 1  .  Omega 3 1200 MG CAPS Take 1,200 mg by mouth every morning.     . ondansetron (ZOFRAN) 8 MG tablet Take 1 tablet (8 mg total) by mouth every 8 (eight) hours as needed for nausea or vomiting. 30 tablet 2  . OXYGEN-HELIUM IN Inhale 3 L into the lungs See admin instructions. Uses when needed during the day, uses continuous throughout the night    . pantoprazole (PROTONIX) 40 MG tablet TAKE 1 TABLET BY MOUTH ONCE A DAY 90 tablet 3  . potassium chloride SA (KLOR-CON M20) 20 MEQ tablet Take 1 tablet (20 mEq total) by mouth daily. 90 tablet 3  . SYMBICORT 160-4.5 MCG/ACT inhaler INHALE 2 PUFFS INTO THE LUNGS 2 (TWO) TIMES DAILY. 10.2 Inhaler 11  . vitamin C (ASCORBIC ACID) 500 MG tablet Take 500 mg by mouth daily.     No current facility-administered medications for this visit.     LABS/IMAGING: No results found for this or any previous visit (from the past 48 hour(s)). No results found.  VITALS: BP 102/63   Pulse 87   Ht 5\' 2"  (1.575 m)   Wt 123 lb 9.6 oz (56.1 kg)   BMI 22.61 kg/m   EXAM: General appearance: alert, appears older than stated age, no distress and on oxygen by nasal cannula Neck: no carotid bruit and no JVD Lungs: diminished breath sounds bilaterally and faint rhonchi Heart: regular rate and rhythm, S1, S2 normal and systolic murmur: early systolic 3/6, crescendo at 2nd right intercostal space Abdomen: soft, non-tender; bowel sounds normal; no masses,  no organomegaly Extremities: extremities normal, atraumatic, no cyanosis or edema Pulses: 2+ and symmetric Skin: Skin color, texture, turgor normal. No rashes or lesions Neurologic: Grossly normal Psych: At baseline mood, affect  EKG: Sinus rhythm with PACs at 87, possible septal infarct-worsening review  ASSESSMENT: 1. Severe COPD with moderate pulmonary hypertension, on chronic  oxygen (disabled d/t this) 2. Mild aortic stenosis 3. Low risk nuclear stress testing in 2012 4. Dyslipidemia - on livalo   5. History of CAD with prior remote PCI  PLAN: 1.   Ms. Badon seems to be doing well. Her COPD is stable. She's had appropriate vaccinations for the upcoming flu season. Her aortic stenosis is stable. She denies any recurrent chest pain. She is due for repeat lipid profile and may need to consider increasing her Livalo. Her LDL was 99 on recent laboratory testing. Her goal LDL-C is less than 70.  Follow-up with me in 6 months.  Pixie Casino, MD, [facility] Attending Cardiologist [place] [facility] 06/20/2017, 11:21 AM

## 2017-06-20 NOTE — Patient Instructions (Addendum)
Your physician recommends that you return for lab work fasting to check cholesterol   Your physician wants you to follow-up in: 6 months with Dr. Hilty. You will receive a reminder letter in the mail two months in advance. If you don't receive a letter, please call our office to schedule the follow-up appointment.   

## 2017-06-22 ENCOUNTER — Telehealth: Payer: Self-pay | Admitting: Internal Medicine

## 2017-06-22 ENCOUNTER — Encounter: Payer: Self-pay | Admitting: Internal Medicine

## 2017-06-22 NOTE — Telephone Encounter (Signed)
New Message  Pt call requesting to speak with RN about getting her paperwork for her oxygen machine sent to [facility]. Fax number is [phone]. Please call back to discuss if needed.

## 2017-06-22 NOTE — Telephone Encounter (Signed)
Returned call to patient she stated she needs Dr.Hilty to send O2 paper work to [employer] # listed below.Stated she wants light weight O2 tank.Stated she just saw Dr.Hilty and he already has the paper work.Advised I will send message to his RN.

## 2017-06-23 NOTE — Telephone Encounter (Signed)
Spoke with pt she states that her insurance stopped her current O2 company and has told her to find a new provider for her O2 and this is what she is trying to do. She states that she has already [employer] this and she eill call them and try to get this figured out and call us back, or have them call us back with what they need.

## 2017-06-23 NOTE — Telephone Encounter (Signed)
Called Heather w/[facility] to attempt to order home portable oxygen machine. This patient is not an active patient w/[facility] and per Nira Conn, she will need to get this from her current DME company

## 2017-06-23 NOTE — Telephone Encounter (Signed)
LMTCB

## 2017-06-23 NOTE — Telephone Encounter (Signed)
Ok to order portable oxygen machine with [employer]. I'm not certain we have paperwork, but I will approve it.  Dr. Lemmie Evens

## 2017-06-23 NOTE — Telephone Encounter (Signed)
Upon chart review, patient has seen Dr. Sherrin Daisy with pulmonary in the past. There is no documentation that anything regarding home oxygen has been ordered or any orders faxed to a DME company for such in the last 2 years by myself or MD

## 2017-06-23 NOTE — Telephone Encounter (Signed)
New Message ° ° pt verbalized that she is returning call for rn °

## 2017-06-25 ENCOUNTER — Other Ambulatory Visit: Payer: Self-pay | Admitting: Internal Medicine

## 2017-06-25 DIAGNOSIS — D508 Other iron deficiency anemias: Secondary | ICD-10-CM

## 2017-06-28 ENCOUNTER — Encounter: Payer: Self-pay | Admitting: Internal Medicine

## 2017-07-04 ENCOUNTER — Telehealth: Payer: Self-pay | Admitting: Internal Medicine

## 2017-07-04 DIAGNOSIS — Z9981 Dependence on supplemental oxygen: Secondary | ICD-10-CM

## 2017-07-04 DIAGNOSIS — J449 Chronic obstructive pulmonary disease, unspecified: Secondary | ICD-10-CM

## 2017-07-04 NOTE — Telephone Encounter (Signed)
Please call,needs prescription for oxygen.

## 2017-07-04 NOTE — Telephone Encounter (Signed)
LM for [facility] to return call  Per telephone note dates 06/22/17 - MD OK'ed order for portable oxygen machine. We do not have paperwork for this

## 2017-07-05 ENCOUNTER — Encounter: Payer: Self-pay | Admitting: Internal Medicine

## 2017-07-08 NOTE — Telephone Encounter (Signed)
Spoke with Lovena Le with [facility]. She is not a current patient with them. In order for them to take over the DME - oxygen supplies, demographics, insurance info, testing that indicated that she needs O2 will need to be sent to them. Advised that Dr. Debara Pickett did not start patient on home O2 - she previously had a pulmonologist - Dr. Lake Bells - but has not seen him since 2016 per chart review. PCP is prescribing her nebulizer, etc.   Lovena Le states patient gave her a number for her current supplier and she will try to contact them. [phone].   Lovena Le informed me that patient will have to be titrated on a POC and [facility] does not know patient's liter flow etc. A POC only goes up to 5L and if patient is on a liter flow close to that, then she may not be able to get a POC. Informed her that patient has been on O2 as long as I have worked with MD, which is >4 years.

## 2017-07-08 NOTE — Telephone Encounter (Signed)
Left message for [facility] to return call

## 2017-07-11 ENCOUNTER — Other Ambulatory Visit: Payer: Self-pay | Admitting: Internal Medicine

## 2017-07-11 NOTE — Telephone Encounter (Signed)
Faxed script back to [facility].../lmb  

## 2017-07-14 ENCOUNTER — Encounter: Payer: Self-pay | Admitting: Internal Medicine

## 2017-07-14 NOTE — Telephone Encounter (Signed)
LMTCB to review status of POC order/request.

## 2017-07-14 NOTE — Telephone Encounter (Signed)
Per patient MyChart message 10/18  Message   I am trying to get my oxygen machine.I understand that [place] equipment is not responded to your calls.     The oxygen company that I spoke to is [employer] [facility].    I need a 24 hrs contractor plug into the wall 02 liter.      A portable Non Liter conserving device. With batteries, it's called Care Unit Device. It comes with two chargeable batteries that last 8 hours each.    [facility] Needs:  * Chart notes last visit  *DR.Hilty rx including his NPI number.  *Copy of my Demographics that shows Name,DOB,Insurance Information.  *Test results  *letter of Medical Necessary    [phone]    [phone]    Attn: New Account Processing  [place].    I thank you so much for helping get this straightened out. It is so frustrating at my age.    Please call me let me know the outcome. Again thank you so much for time.    [name]

## 2017-07-14 NOTE — Telephone Encounter (Signed)
Pt sent a Mychart message regarding her [name],  Message was: Patient Comments: I am still waiting for the Polara shot. I don't know what the story is . I am going to come in blood work and my shot at the same time. I need an appointment for March 2019 for my physical.  I have been trying to get my Polaraa shot for over 6 weeks.  IAM IN PAIN.This just crazy.  The last time I came in for a shot your office staff didn't even have the shot in the office. I need an appointment ASAP for my shot.  Please call me ASAP. This is very frustrating that staff can't get it together.   Please call patient back in regard

## 2017-07-15 ENCOUNTER — Telehealth: Payer: Self-pay

## 2017-07-15 ENCOUNTER — Telehealth: Payer: Self-pay | Admitting: Internal Medicine

## 2017-07-15 NOTE — Telephone Encounter (Signed)
Duplicate encounter created pertaining to same issue. This encounter will be closed

## 2017-07-15 NOTE — Telephone Encounter (Signed)
Patients insurance has been verified for prolia---she can come in at her earliest convenience---I have left 2 voicemails, one on 9/19 and one again today---I have also sent mychart message---patient has copay $215 with the understanding she may also be billed for the remaining deductible $183 if it has not been met---can talk with tamara if any further questions

## 2017-07-15 NOTE — Telephone Encounter (Addendum)
Voicemail was left on patients phone on 9/19 (see other tele note) and also today, copay is $215 with understanding she may also receive bill for remaining $183 deductible if she has not yet met that this year---can schedule at patient's earliest convenience, can talk with Jheri Mitter if any further questions---also sent mychart message

## 2017-07-15 NOTE — Telephone Encounter (Signed)
New message    Patient returning call. Please call back

## 2017-07-15 NOTE — Telephone Encounter (Signed)
Printed order, demographics, last MD note, original O2 testing + note, and original O2 order faxed to [facility]  Patient notified via MyChart message

## 2017-07-15 NOTE — Telephone Encounter (Signed)
Spoke with Raven @ [facility] to discuss what is needed to process order for requested supplies. She informed me that patient is not a "current" patient with them and in order to provide supplies, they need a copy of her "testing" where her oxygen order/necessity originated. Explained that we did not initiate oxygen therapy for this patient. She states this info + oxygen saturations is necessary in order to get order/supplies for patient.  LM for patient to call back to discuss. Also sent message via MyChart

## 2017-07-21 ENCOUNTER — Encounter: Payer: Self-pay | Admitting: Internal Medicine

## 2017-07-21 ENCOUNTER — Telehealth: Payer: Self-pay | Admitting: Internal Medicine

## 2017-07-21 ENCOUNTER — Encounter: Payer: Self-pay | Admitting: *Deleted

## 2017-07-21 NOTE — Telephone Encounter (Signed)
MyChart message sent to patient explaining situation & that this will need to be reviewed by MD

## 2017-07-21 NOTE — Telephone Encounter (Signed)
Received 2 fax failed notifications. Called [facility] to verify fax # and correct fax # is [phone]. Will refax paperwork for O2 processing

## 2017-07-21 NOTE — Telephone Encounter (Signed)
New Message     They received order for a portable concentrator, they can not use the order they received , they need patient to be seen in office and new qualifying test done for the Oxygen and concentrator, and a new order including the concentrator and portable concentrator an the patient can tolerate a concentrator device , they also need a copy of new office visit notes

## 2017-07-27 ENCOUNTER — Other Ambulatory Visit: Payer: Self-pay | Admitting: Nurse Practitioner

## 2017-07-27 NOTE — Telephone Encounter (Signed)
Eliezer Lofts - what else do I need to do for this oxygen concentrator?  Dr. Lemmie Evens

## 2017-07-28 ENCOUNTER — Telehealth: Payer: Self-pay | Admitting: Internal Medicine

## 2017-07-28 ENCOUNTER — Encounter: Payer: Self-pay | Admitting: *Deleted

## 2017-07-28 NOTE — Telephone Encounter (Signed)
MyChart message sent to patient to call to arrange appt for oxygen testing in office per Medicare guidelines

## 2017-07-28 NOTE — Telephone Encounter (Signed)
Appt scheduled 11-20 for O2 testing

## 2017-07-28 NOTE — Telephone Encounter (Signed)
Patient scheduled for MD OV 08/16/17 to review oxygen/testing

## 2017-07-28 NOTE — Telephone Encounter (Signed)
New message     Pt returning [place] call to set up O2 test

## 2017-08-04 ENCOUNTER — Encounter: Payer: Self-pay | Admitting: Internal Medicine

## 2017-08-16 ENCOUNTER — Ambulatory Visit (INDEPENDENT_AMBULATORY_CARE_PROVIDER_SITE_OTHER): Payer: Medicare Other | Admitting: Internal Medicine

## 2017-08-16 ENCOUNTER — Encounter: Payer: Self-pay | Admitting: Internal Medicine

## 2017-08-16 ENCOUNTER — Telehealth: Payer: Self-pay | Admitting: Internal Medicine

## 2017-08-16 VITALS — BP 102/64 | HR 87 | Ht 62.0 in | Wt 122.4 lb

## 2017-08-16 DIAGNOSIS — I2510 Atherosclerotic heart disease of native coronary artery without angina pectoris: Secondary | ICD-10-CM

## 2017-08-16 DIAGNOSIS — I350 Nonrheumatic aortic (valve) stenosis: Secondary | ICD-10-CM

## 2017-08-16 DIAGNOSIS — I5032 Chronic diastolic (congestive) heart failure: Secondary | ICD-10-CM | POA: Diagnosis not present

## 2017-08-16 DIAGNOSIS — Z9981 Dependence on supplemental oxygen: Secondary | ICD-10-CM

## 2017-08-16 NOTE — Progress Notes (Signed)
OFFICE NOTE  Chief Complaint:  No complaints  Primary Care Physician: [name], MD  HPI:  [name] a pleasant 71 year old female with a history of COPD and severe pulmonary hypertension. Recently she was put on home oxygen and has been on an oxygen concentrator, which has made a marked improvement in her ability to get around. She has previously seen Dr. Lamonte Sakai with pulmonology; however, that did not go too well and she has basically given up on any further treatments for COPD other than her oxygen. She still uses nebulizers as needed when she is tight, and I recommended Claritin for seasonal allergies today. As you know, she also has mild aortic stenosis and we are continuing to follow that. She is a low-risk stress test in November 2012. Unfortunately she was recently admitted for altered mental status, possible Tylenol overdose, respiratory failure and liver failure. All of which seems to have resolved. She seems to be doing pre-well after discharge.  She returns today for followup. She reports doing fairly well. She has seen Dr. Lake Bells in the interim and he is recommended continue current therapies. She is now seeing a different internist, Dr. Ronnald Ramp. He was recently admitted the hospital for pneumonia and treated accordingly. She had troponins which were negative suggesting they're likely is no significant underlying obstructive coronary disease. She reports she's recovered and is back to her base. She is overdue for repeat lipid profile.  I saw [name] back in the office today. Overall she reports she is doing fairly well. She's not been admitted to the hospital recently. She says she has stable shortness of breath. She denies any chest pain. It should be noted that she does have mild aortic stenosis which will last assessed in 2014.  [name] returns today for follow-up. She reports that she is doing fairly well. Fortune she's not been hospitalized. She has had an  episode of COPD exacerbation which was supported with early antibiotics. She denies any worsening chest pain and has pretty stable shortness of breath on oxygen. She is able to ambulate and do most of her activities without any restrictions.  03/31/2016  [name] was seen back today in the office for follow-up. She seems to be doing fairly well although does use her oxygen almost continuously. Saturation was low today although she had heavy fingernail polish on and therefore the reading may not be accurate as it read 88%. She was on oxygen via a concentrator. Blood pressure was stable at 108/74. Weight is been stable. She recently had a repeat echocardiogram which shows stable mild aortic stenosis and normal LV function with some mild improvement in pulmonary pressures in May. She denies any chest pain. Fortune that she's not been admitted to the hospital recently for any pneumonia but did have problems with recent gallbladder disease and had cholecystectomy.  12/17/2016  [name] returns today for follow-up. She's done well over the last year has not been hospitalized. She reports stable shortness of breath. She had mild aortic stenosis which is stable by echo last year and will need to be repeated in May of this year. Her last lipid profile showed total cholesterol 197, HDL C 78, triglycerides 100, an LDL-C 99. We'll need to repeat her lipid profile as well.  06/20/2017  I saw [name] back today for follow-up. She is doing well. We repeated on echo in 01/2017, this demonstrated normal LVEF 60-65%, mild stable aortic stenosis. She denies any worsening shortness of breath or chest  pain. Cholesterol remains elevated. Will plan to recheck that today. She could conceivably increase her Livalo. She reports having had her flu vaccine this year.  08/16/2017  [name] returns today for follow-up.  She seen specifically for oxygen testing.  We tested her room air saturation off of oxygen and it was noted to be 88% at rest.   We did not do ambulatory testing as she met criteria for oxygen therapy.  She is currently on an oxygen concentrator which we wish to renew.  She needs the oxygen concentrator as she is active and is mobile but requires continuous oxygen therapy -she is on 3 L continuous by nasal cannula.  PMHx:  Past Medical History:  Diagnosis Date  . Abnormal LFTs 08/02/2011  . Acute liver failure 06/19/2013  . Acute renal failure ([place]) 06/19/2013  . Acute respiratory failure ([place]) 06/19/2013  . Altered mental status 08/01/2011  . Angina   . Anxiety   . Anxiety state, unspecified 12/03/2013  . Aortic stenosis    mild  . Arthritis    "hands" (02/03/2017)  . Breast cancer of upper-outer quadrant of left female breast ([place]) 08/21/2013  . Breast cancer, right breast ([place]) dx'd 2008  . CAD (coronary artery disease) of artery bypass graft 11/06/2013  . CAD (coronary artery disease), MI R/O 08/01/2011   PCI in 2006 (bare metal stent, unknown artery) - [place]   . CHF,  acute diastolic, BNP 4k on admissio 08/01/2011  . Chronic bronchitis ([place])   . Chronic respiratory failure ([place]) 02/02/2012  . Compression fracture of L1 lumbar vertebra ([place]) 02/02/2012  . COPD (chronic obstructive pulmonary disease) (H[facility])    oxygen-dependent 4LPM [place]  . Coronary artery disease 2006   2 stents w/previous MI  . Depressed   . Diastolic CHF ([place])   . Dyslipidemia   . Family history of adverse reaction to anesthesia    "daughter had c-section; missed twice w/epidural"  . GERD (gastroesophageal reflux disease)   . Heart murmur   . History of blood transfusion    "w/my colon OR"  . 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 History of nuclear stress test 08/03/2011   attenuation at apex - no perfusion defects   . HTN (hypertension) 11/06/2013  . Hyperkalemia, on ACE prior to admission 11/11/2011  . Hypertension   . Hyponatremia 01/31/2012  . Migraine    "qod to q couple months since I was 21" (02/03/2017)  . Mild aortic stenosis 08/01/2011    AVA 1.69 cm2 (06/02/2011)   . Moderate to severe pulmonary hypertension ([place])   . NSTEMI (non-ST elevated myocardial infarction) ([place]) 2006  . NSVT (nonsustained ventricular tachycardia) (HCC)    h/o  . On home oxygen therapy    "3L just at night; have it available prn" (02/03/2017)  . Pneumonia    "alot of times" (02/03/2017)    Past Surgical History:  Procedure Laterality Date  . BREAST BIOPSY Right 2008  . BREAST LUMPECTOMY Right 2008   malignant  . CATARACT EXTRACTION W/ INTRAOCULAR LENS  IMPLANT, BILATERAL Bilateral ~ 2013  . [place]; 1971; 1978  . CHOLECYSTECTOMY OPEN  2011  . COLON SURGERY    . COLOSTOMY  11/2007  . COLOSTOMY CLOSURE  07/2008  . CORONARY ANGIOPLASTY WITH STENT PLACEMENT  2006   "2 stents"  . ENDOSCOPIC RETROGRADE CHOLANGIOPANCREATOGRAPHY (ERCP) N/A 09/05/2015   Performed by Ladene Artist, MD at [facility]  . PARTIAL COLECTOMY  2009   for obstruction:  temporary ostomy, later reversed.   Marland Kitchen RIGHT HEART CATH N/A 09/05/2013   Performed by Jolaine Artist, MD at [facility] CATH LAB  . TRANSTHORACIC ECHOCARDIOGRAM  11/12/2011   EF 73-22%, normal systolic function, grade 1 diastolic dysfunction; ventricular septal flattening (D-sign); mild AS; trace-mild MR; LA mildly dilated; RV mod dilated; RA mod dilated; severe pulm HTN; elevated CVP    FAMHx:  Family History  Problem Relation Age of Onset  . Schizophrenia Sister   . Alzheimer's disease Mother   . Hyperlipidemia Brother   . Heart disease Sister   . Diabetes Sister   . Cancer Neg Hx   . Stroke Neg Hx   . COPD Neg Hx   . Depression Neg Hx   . Drug abuse Neg Hx   . Early death Neg Hx   . Hypertension Neg Hx   . Kidney disease Neg Hx     SOCHx:   reports that she has been smoking cigarettes.  She has a 5.20 pack-year smoking history. she has never used smokeless tobacco. She reports that she drinks about 0.6 oz of alcohol per week. She reports that she does not use drugs.  ALLERGIES:    Allergies  Allergen Reactions  . Ceftriaxone Anaphylaxis and Other (See Comments)    *ROCEPHIN*  "Blow up like a balloon"  . Hydroxyzine Shortness Of Breath and Other (See Comments)    Pt states med make her light headed, get sob sxs  . Doxycycline Nausea And Vomiting  . Lexapro [Escitalopram] Other (See Comments)    Pt states med make her dizzy    ROS: Pertinent items noted in HPI and remainder of comprehensive ROS otherwise negative.  HOME MEDS: Current Outpatient Medications  Medication Sig Dispense Refill  . albuterol (VENTOLIN HFA) 108 (90 Base) MCG/ACT inhaler INHALE 2 PUFFS INTO THE LUNGS EVERY 4 HOURS AS NEEDED 36 Inhaler 5  . ALPRAZolam (XANAX) 1 MG tablet TAKE 1 TABLET BY MOUTH TWICE A DAY AS NEEDED 60 tablet 2  . Artificial Tear Ointment (DRY EYES OP) Apply 2 drops to eye daily as needed.    Marland Kitchen aspirin 325 MG EC tablet Take 1 tablet (325 mg total) by mouth every morning.    . ATROVENT HFA 17 MCG/ACT inhaler USE 2 PUFFS INTO THE LUNGS EVERY 4 HOURS AS NEEDED 77.4 Inhaler 5  . butalbital-acetaminophen-caffeine (FIORICET, ESGIC) 50-325-40 MG tablet TAKE 1 TO 2 TABLETS BY MOUTH AS NEEDED FOR HEADACHE 60 tablet 5  . Calcium Carbonate (CALCIUM 600 PO) Take by mouth daily.    . cholecalciferol (VITAMIN D) 1000 units tablet Take 1,000 Units by mouth daily.    . diclofenac sodium (VOLTAREN) 1 % GEL APPLY 4 G TOPICALLY DAILY AS NEEDED (APPLY TO HANDS). HAND PAIN  2  . fluticasone (FLONASE) 50 MCG/ACT nasal spray USE 2 SPRAYS INTO EACH NOSTRIL ONCE DAILY**REPEAT FOR 5 DAYS THEN STOP 16 g 11  . furosemide (LASIX) 40 MG tablet TAKE 1 TABLET BY MOUTH TWICE A DAY 180 tablet 1  . ipratropium-albuterol (DUONEB) 0.5-2.5 (3) MG/3ML SOLN Take 3 mLs by nebulization every 4 (four) hours as needed. 360 mL 0  . lactulose, encephalopathy, (CHRONULAC) 10 GM/15ML SOLN TAKE 30MLS BY MOUTH 3 TIMES A DAY AS NEEDED FOR CONSTIPATION 1892 mL 1  . levocetirizine (XYZAL) 5 MG tablet Take 1 tablet (5 mg  total) by mouth every evening. 90 tablet 3  . LIVALO 2 MG TABS TAKE 1 TABLET BY MOUTH EVERY DAY 90 tablet 1  .  montelukast (SINGULAIR) 10 MG tablet Take 1 tablet (10 mg total) by mouth at bedtime. 30 tablet 11  . nortriptyline (PAMELOR) 25 MG capsule Take 2 capsules (50 mg total) by mouth at bedtime. 180 capsule 1  . Omega 3 1200 MG CAPS Take 1,200 mg by mouth every morning.     . ondansetron (ZOFRAN) 8 MG tablet Take 1 tablet (8 mg total) by mouth every 8 (eight) hours as needed for nausea or vomiting. 30 tablet 2  . OXYGEN-HELIUM IN Inhale 3 L into the lungs See admin instructions. Uses when needed during the day, uses continuous throughout the night    . pantoprazole (PROTONIX) 40 MG tablet TAKE 1 TABLET BY MOUTH ONCE A DAY 90 tablet 3  . potassium chloride SA (KLOR-CON M20) 20 MEQ tablet Take 1 tablet (20 mEq total) by mouth daily. 90 tablet 3  . SYMBICORT 160-4.5 MCG/ACT inhaler INHALE 2 PUFFS INTO THE LUNGS 2 (TWO) TIMES DAILY. 10.2 Inhaler 11  . vitamin C (ASCORBIC ACID) 500 MG tablet Take 500 mg by mouth daily.     No current facility-administered medications for this visit.     LABS/IMAGING: No results found for this or any previous visit (from the past 48 hour(s)). No results found.  VITALS: BP 102/64   Pulse 87   Ht _0  (1.575 m)   Wt 122 lb 6.4 oz (55.5 kg)   SpO2 (!) 88%   BMI 22.39 kg/m   EXAM: Deferred  EKG: Deferred  ASSESSMENT: 1. Severe COPD with moderate pulmonary hypertension, on chronic oxygen (disabled d/t this) 2. Mild aortic stenosis 3. Low risk nuclear stress testing in 2012 4. Dyslipidemia - on livalo   5. History of CAD with prior remote PCI  PLAN: 1.   Ms. Hopkins requires chronic oxygen therapy.  We recorded a room air oxygen saturation of 88% today off of oxygen.  This was at rest, without ambulation.  It indicates that she will require continue oxygen therapy.  She does use a portable oxygen generator for which she will need to renew to  maintain her mobility.  I will reorder her oxygen concentrator.  She is on 3 L continuous by nasal cannula.  Follow-up with me in 6 months.  Barbara Casino, MD, [facility] Attending Cardiologist [place] [facility] Sh[place] Ginty 08/16/2017, 11:54 AM

## 2017-08-16 NOTE — Telephone Encounter (Signed)
Patient states that scripts go through [facility] Member ID [id number] Insur:  [place] [id number] [place] [facility] [id number] Provider number [phone] Pharmacy [phone]

## 2017-08-16 NOTE — Telephone Encounter (Signed)
Patient requesting call back to schedule prolia.  Patient states Jonelle Sidle verified incorrect insurance.  States updated insurance is now in system.

## 2017-08-16 NOTE — Telephone Encounter (Signed)
Left voicemail advising that medicare and medicaid is insurance verified for prolia injections----that is latest insurance cards on patient's demographics---if she has another insurance now, I will need new cards brought to office and I will reverify from new information---can talk with Jennipher Weatherholtz if any further questions

## 2017-08-17 ENCOUNTER — Telehealth: Payer: Self-pay | Admitting: Internal Medicine

## 2017-08-17 ENCOUNTER — Other Ambulatory Visit: Payer: Self-pay | Admitting: *Deleted

## 2017-08-17 DIAGNOSIS — J438 Other emphysema: Secondary | ICD-10-CM

## 2017-08-17 DIAGNOSIS — Z9981 Dependence on supplemental oxygen: Secondary | ICD-10-CM

## 2017-08-17 NOTE — Telephone Encounter (Signed)
Faxed to [facility] Account Processing Attn: Tamika MD office note from 08/16/17, oxygen order, demographics. Patient notified via MyChart message

## 2017-08-17 NOTE — Telephone Encounter (Signed)
[name] is checking with margaret/prolia to get patient on board with pharmacy rx card---I will call patient back to discuss

## 2017-08-22 ENCOUNTER — Encounter: Payer: Self-pay | Admitting: Internal Medicine

## 2017-08-22 ENCOUNTER — Other Ambulatory Visit: Payer: Self-pay | Admitting: Internal Medicine

## 2017-08-22 DIAGNOSIS — F409 Phobic anxiety disorder, unspecified: Secondary | ICD-10-CM | POA: Insufficient documentation

## 2017-08-22 DIAGNOSIS — F5105 Insomnia due to other mental disorder: Secondary | ICD-10-CM | POA: Insufficient documentation

## 2017-08-22 MED ORDER — NORTRIPTYLINE HCL 25 MG PO CAPS: 50.0000 mg | ORAL_CAPSULE | Freq: Every day | ORAL | 1 refills | Status: DC

## 2017-08-22 NOTE — Telephone Encounter (Signed)
Patient requesting message to be sent to her through my chart once benefits confirmed.

## 2017-08-23 ENCOUNTER — Telehealth: Payer: Self-pay | Admitting: Internal Medicine

## 2017-08-23 NOTE — Telephone Encounter (Signed)
Faxed signed certificate of medical necessity for oxygen orders to [place]

## 2017-08-24 ENCOUNTER — Emergency Department (HOSPITAL_COMMUNITY): Payer: Medicare Other

## 2017-08-24 ENCOUNTER — Inpatient Hospital Stay (HOSPITAL_COMMUNITY)
Admission: EM | Admit: 2017-08-24 | Discharge: 2017-08-29 | DRG: 191 | Disposition: A | Discharge status: Home / Self Care | Payer: Medicare Other | Attending: Family Medicine | Admitting: Family Medicine

## 2017-08-24 ENCOUNTER — Encounter (HOSPITAL_COMMUNITY): Payer: Self-pay

## 2017-08-24 ENCOUNTER — Other Ambulatory Visit: Payer: Self-pay

## 2017-08-24 DIAGNOSIS — Z7982 Long term (current) use of aspirin: Secondary | ICD-10-CM

## 2017-08-24 DIAGNOSIS — K219 Gastro-esophageal reflux disease without esophagitis: Secondary | ICD-10-CM | POA: Diagnosis present

## 2017-08-24 DIAGNOSIS — Z8349 Family history of other endocrine, nutritional and metabolic diseases: Secondary | ICD-10-CM

## 2017-08-24 DIAGNOSIS — Z853 Personal history of malignant neoplasm of breast: Secondary | ICD-10-CM | POA: Diagnosis not present

## 2017-08-24 DIAGNOSIS — I272 Pulmonary hypertension, unspecified: Secondary | ICD-10-CM | POA: Diagnosis present

## 2017-08-24 DIAGNOSIS — J441 Chronic obstructive pulmonary disease with (acute) exacerbation: Principal | ICD-10-CM | POA: Diagnosis present

## 2017-08-24 DIAGNOSIS — J9611 Chronic respiratory failure with hypoxia: Secondary | ICD-10-CM | POA: Diagnosis present

## 2017-08-24 DIAGNOSIS — Z8249 Family history of ischemic heart disease and other diseases of the circulatory system: Secondary | ICD-10-CM

## 2017-08-24 DIAGNOSIS — R0602 Shortness of breath: Secondary | ICD-10-CM | POA: Diagnosis present

## 2017-08-24 DIAGNOSIS — Z818 Family history of other mental and behavioral disorders: Secondary | ICD-10-CM

## 2017-08-24 DIAGNOSIS — E785 Hyperlipidemia, unspecified: Secondary | ICD-10-CM | POA: Diagnosis present

## 2017-08-24 DIAGNOSIS — I350 Nonrheumatic aortic (valve) stenosis: Secondary | ICD-10-CM | POA: Diagnosis present

## 2017-08-24 DIAGNOSIS — F419 Anxiety disorder, unspecified: Secondary | ICD-10-CM | POA: Diagnosis not present

## 2017-08-24 DIAGNOSIS — I10 Essential (primary) hypertension: Secondary | ICD-10-CM | POA: Diagnosis present

## 2017-08-24 DIAGNOSIS — Z9981 Dependence on supplemental oxygen: Secondary | ICD-10-CM | POA: Diagnosis not present

## 2017-08-24 DIAGNOSIS — Z888 Allergy status to other drugs, medicaments and biological substances status: Secondary | ICD-10-CM

## 2017-08-24 DIAGNOSIS — Z9841 Cataract extraction status, right eye: Secondary | ICD-10-CM | POA: Diagnosis not present

## 2017-08-24 DIAGNOSIS — Z955 Presence of coronary angioplasty implant and graft: Secondary | ICD-10-CM | POA: Diagnosis not present

## 2017-08-24 DIAGNOSIS — K5909 Other constipation: Secondary | ICD-10-CM | POA: Diagnosis present

## 2017-08-24 DIAGNOSIS — Z8701 Personal history of pneumonia (recurrent): Secondary | ICD-10-CM

## 2017-08-24 DIAGNOSIS — Z9049 Acquired absence of other specified parts of digestive tract: Secondary | ICD-10-CM

## 2017-08-24 DIAGNOSIS — I110 Hypertensive heart disease with heart failure: Secondary | ICD-10-CM | POA: Diagnosis present

## 2017-08-24 DIAGNOSIS — D649 Anemia, unspecified: Secondary | ICD-10-CM | POA: Diagnosis present

## 2017-08-24 DIAGNOSIS — Z79899 Other long term (current) drug therapy: Secondary | ICD-10-CM

## 2017-08-24 DIAGNOSIS — N179 Acute kidney failure, unspecified: Secondary | ICD-10-CM | POA: Diagnosis present

## 2017-08-24 DIAGNOSIS — I5032 Chronic diastolic (congestive) heart failure: Secondary | ICD-10-CM | POA: Diagnosis present

## 2017-08-24 DIAGNOSIS — F411 Generalized anxiety disorder: Secondary | ICD-10-CM | POA: Diagnosis present

## 2017-08-24 DIAGNOSIS — Z961 Presence of intraocular lens: Secondary | ICD-10-CM | POA: Diagnosis present

## 2017-08-24 DIAGNOSIS — F17200 Nicotine dependence, unspecified, uncomplicated: Secondary | ICD-10-CM | POA: Diagnosis present

## 2017-08-24 DIAGNOSIS — Z7951 Long term (current) use of inhaled steroids: Secondary | ICD-10-CM

## 2017-08-24 DIAGNOSIS — I252 Old myocardial infarction: Secondary | ICD-10-CM | POA: Diagnosis not present

## 2017-08-24 DIAGNOSIS — B9789 Other viral agents as the cause of diseases classified elsewhere: Secondary | ICD-10-CM | POA: Diagnosis present

## 2017-08-24 DIAGNOSIS — Z9842 Cataract extraction status, left eye: Secondary | ICD-10-CM | POA: Diagnosis not present

## 2017-08-24 DIAGNOSIS — I2510 Atherosclerotic heart disease of native coronary artery without angina pectoris: Secondary | ICD-10-CM | POA: Diagnosis present

## 2017-08-24 DIAGNOSIS — J449 Chronic obstructive pulmonary disease, unspecified: Secondary | ICD-10-CM | POA: Diagnosis present

## 2017-08-24 DIAGNOSIS — Z881 Allergy status to other antibiotic agents status: Secondary | ICD-10-CM

## 2017-08-24 LAB — BASIC METABOLIC PANEL
Anion gap: 8 (ref 5–15)
BUN: 14 mg/dL (ref 6–20)
CO2: 32 mmol/L (ref 22–32)
Calcium: 9.4 mg/dL (ref 8.9–10.3)
Chloride: 98 mmol/L — ABNORMAL LOW (ref 101–111)
Creatinine, Ser: 1.15 mg/dL — ABNORMAL HIGH (ref 0.44–1.00)
GFR calc Af Amer: 54 mL/min — ABNORMAL LOW (ref >60)
GFR calc non Af Amer: 47 mL/min — ABNORMAL LOW (ref >60)
Glucose, Bld: 112 mg/dL — ABNORMAL HIGH (ref 65–99)
Potassium: 3.9 mmol/L (ref 3.5–5.1)
Sodium: 138 mmol/L (ref 135–145)

## 2017-08-24 LAB — RESPIRATORY PANEL BY PCR

## 2017-08-24 LAB — CBC
HCT: 36.5 % (ref 36.0–46.0)
Hemoglobin: 12 g/dL (ref 12.0–15.0)
MCH: 32.7 pg (ref 26.0–34.0)
MCHC: 32.9 g/dL (ref 30.0–36.0)
MCV: 99.5 fL (ref 78.0–100.0)
Platelets: 194 10*3/uL (ref 150–400)
RBC: 3.67 MIL/uL — ABNORMAL LOW (ref 3.87–5.11)
RDW: 12.9 % (ref 11.5–15.5)
WBC: 8.6 10*3/uL (ref 4.0–10.5)

## 2017-08-24 LAB — I-STAT TROPONIN, ED: Troponin i, poc: 0 ng/mL (ref 0.00–0.08)

## 2017-08-24 LAB — BRAIN NATRIURETIC PEPTIDE: B Natriuretic Peptide: 36.6 pg/mL (ref 0.0–100.0)

## 2017-08-24 LAB — MAGNESIUM: Magnesium: 1.9 mg/dL (ref 1.7–2.4)

## 2017-08-24 MED ORDER — ASPIRIN EC 325 MG PO TBEC
325.0000 mg | DELAYED_RELEASE_TABLET | Freq: Every morning | ORAL | Status: DC
  Administered 2017-08-24 – 2017-08-29 (×6): 325 mg via ORAL
  Filled 2017-08-24 (×7): qty 1

## 2017-08-24 MED ORDER — NORTRIPTYLINE HCL 25 MG PO CAPS
50.0000 mg | ORAL_CAPSULE | Freq: Every day | ORAL | Status: DC
  Administered 2017-08-24 – 2017-08-28 (×5): 50 mg via ORAL
  Filled 2017-08-24 (×6): qty 2

## 2017-08-24 MED ORDER — ONDANSETRON HCL 4 MG PO TABS: 4.0000 mg | ORAL_TABLET | Freq: Four times a day (QID) | ORAL | Status: DC | PRN

## 2017-08-24 MED ORDER — ONDANSETRON HCL 4 MG/2ML IJ SOLN: 4.0000 mg | Freq: Four times a day (QID) | INTRAMUSCULAR | Status: DC | PRN

## 2017-08-24 MED ORDER — VITAMIN D 1000 UNITS PO TABS
1000.0000 [IU] | ORAL_TABLET | Freq: Every day | ORAL | Status: DC
  Administered 2017-08-24 – 2017-08-29 (×6): 1000 [IU] via ORAL
  Filled 2017-08-24 (×6): qty 1

## 2017-08-24 MED ORDER — ALBUTEROL SULFATE (2.5 MG/3ML) 0.083% IN NEBU
5.0000 mg | INHALATION_SOLUTION | Freq: Once | RESPIRATORY_TRACT | Status: AC
  Administered 2017-08-24: 5 mg via RESPIRATORY_TRACT
  Filled 2017-08-24: qty 6

## 2017-08-24 MED ORDER — VITAMIN C 500 MG PO TABS
500.0000 mg | ORAL_TABLET | Freq: Every day | ORAL | Status: DC
  Administered 2017-08-24 – 2017-08-29 (×6): 500 mg via ORAL
  Filled 2017-08-24 (×6): qty 1

## 2017-08-24 MED ORDER — OMEGA-3-ACID ETHYL ESTERS 1 G PO CAPS
1.0000 g | ORAL_CAPSULE | Freq: Every day | ORAL | Status: DC
  Administered 2017-08-24 – 2017-08-29 (×6): 1 g via ORAL
  Filled 2017-08-24 (×7): qty 1

## 2017-08-24 MED ORDER — AZITHROMYCIN 250 MG PO TABS
500.0000 mg | ORAL_TABLET | Freq: Every day | ORAL | Status: DC
  Administered 2017-08-24 – 2017-08-29 (×6): 500 mg via ORAL
  Filled 2017-08-24 (×6): qty 2

## 2017-08-24 MED ORDER — GUAIFENESIN ER 600 MG PO TB12
600.0000 mg | ORAL_TABLET | Freq: Two times a day (BID) | ORAL | Status: DC
  Administered 2017-08-24 – 2017-08-25 (×3): 600 mg via ORAL
  Filled 2017-08-24 (×3): qty 1

## 2017-08-24 MED ORDER — METHYLPREDNISOLONE SODIUM SUCC 125 MG IJ SOLR: 125.0000 mg | Freq: Once | INTRAMUSCULAR | Status: DC

## 2017-08-24 MED ORDER — ACETAMINOPHEN 650 MG RE SUPP: 650.0000 mg | Freq: Four times a day (QID) | RECTAL | Status: DC | PRN

## 2017-08-24 MED ORDER — ACETAMINOPHEN 325 MG PO TABS: 650.0000 mg | ORAL_TABLET | Freq: Four times a day (QID) | ORAL | Status: DC | PRN

## 2017-08-24 MED ORDER — LEVOCETIRIZINE DIHYDROCHLORIDE 5 MG PO TABS: 5.0000 mg | ORAL_TABLET | Freq: Every evening | ORAL | Status: DC

## 2017-08-24 MED ORDER — ENOXAPARIN SODIUM 40 MG/0.4ML ~~LOC~~ SOLN
40.0000 mg | SUBCUTANEOUS | Status: DC
  Administered 2017-08-24 – 2017-08-29 (×6): 40 mg via SUBCUTANEOUS
  Filled 2017-08-24 (×7): qty 0.4

## 2017-08-24 MED ORDER — BUDESONIDE 0.25 MG/2ML IN SUSP
0.2500 mg | Freq: Two times a day (BID) | RESPIRATORY_TRACT | Status: DC
  Administered 2017-08-24 – 2017-08-29 (×11): 0.25 mg via RESPIRATORY_TRACT
  Filled 2017-08-24 (×13): qty 2

## 2017-08-24 MED ORDER — SODIUM CHLORIDE 0.9% FLUSH
3.0000 mL | Freq: Two times a day (BID) | INTRAVENOUS | Status: DC
  Administered 2017-08-24 – 2017-08-27 (×6): 3 mL via INTRAVENOUS

## 2017-08-24 MED ORDER — LACTULOSE 10 GM/15ML PO SOLN
30.0000 g | Freq: Two times a day (BID) | ORAL | Status: DC | PRN
  Filled 2017-08-24: qty 45

## 2017-08-24 MED ORDER — IPRATROPIUM BROMIDE 0.02 % IN SOLN
0.5000 mg | Freq: Once | RESPIRATORY_TRACT | Status: AC
Start: 2017-08-24 — End: 2017-08-24
  Administered 2017-08-24: 0.5 mg via RESPIRATORY_TRACT
  Filled 2017-08-24: qty 2.5

## 2017-08-24 MED ORDER — PRAVASTATIN SODIUM 40 MG PO TABS
40.0000 mg | ORAL_TABLET | Freq: Every day | ORAL | Status: DC
  Administered 2017-08-24 – 2017-08-28 (×5): 40 mg via ORAL
  Filled 2017-08-24 (×5): qty 1

## 2017-08-24 MED ORDER — ALBUTEROL SULFATE (2.5 MG/3ML) 0.083% IN NEBU: 2.5000 mg | INHALATION_SOLUTION | RESPIRATORY_TRACT | Status: DC | PRN

## 2017-08-24 MED ORDER — OMEGA 3 1200 MG PO CAPS: 1200.0000 mg | ORAL_CAPSULE | Freq: Every morning | ORAL | Status: DC

## 2017-08-24 MED ORDER — MONTELUKAST SODIUM 10 MG PO TABS
10.0000 mg | ORAL_TABLET | Freq: Every day | ORAL | Status: DC
  Administered 2017-08-24 – 2017-08-28 (×5): 10 mg via ORAL
  Filled 2017-08-24 (×5): qty 1

## 2017-08-24 MED ORDER — ALPRAZOLAM 0.5 MG PO TABS
1.0000 mg | ORAL_TABLET | Freq: Two times a day (BID) | ORAL | Status: DC | PRN
  Administered 2017-08-27 – 2017-08-28 (×3): 1 mg via ORAL
  Filled 2017-08-24 (×3): qty 2

## 2017-08-24 MED ORDER — LORATADINE 10 MG PO TABS
10.0000 mg | ORAL_TABLET | Freq: Every evening | ORAL | Status: DC
  Administered 2017-08-24 – 2017-08-28 (×5): 10 mg via ORAL
  Filled 2017-08-24 (×5): qty 1

## 2017-08-24 MED ORDER — IPRATROPIUM BROMIDE 0.02 % IN SOLN
0.5000 mg | Freq: Once | RESPIRATORY_TRACT | Status: AC
  Administered 2017-08-24: 0.5 mg via RESPIRATORY_TRACT
  Filled 2017-08-24: qty 2.5

## 2017-08-24 MED ORDER — NICOTINE 14 MG/24HR TD PT24
14.0000 mg | MEDICATED_PATCH | Freq: Every day | TRANSDERMAL | Status: DC
  Filled 2017-08-24 (×4): qty 1

## 2017-08-24 MED ORDER — METHYLPREDNISOLONE SODIUM SUCC 40 MG IJ SOLR
40.0000 mg | Freq: Two times a day (BID) | INTRAMUSCULAR | Status: DC
  Administered 2017-08-24 – 2017-08-25 (×2): 40 mg via INTRAVENOUS
  Filled 2017-08-24 (×2): qty 1

## 2017-08-24 MED ORDER — IPRATROPIUM-ALBUTEROL 0.5-2.5 (3) MG/3ML IN SOLN
3.0000 mL | Freq: Four times a day (QID) | RESPIRATORY_TRACT | Status: DC
  Administered 2017-08-24 – 2017-08-25 (×5): 3 mL via RESPIRATORY_TRACT
  Filled 2017-08-24 (×5): qty 3

## 2017-08-24 MED ORDER — METHYLPREDNISOLONE SODIUM SUCC 125 MG IJ SOLR: 60.0000 mg | Freq: Two times a day (BID) | INTRAMUSCULAR | Status: DC

## 2017-08-24 MED ORDER — CALCIUM CARBONATE 1500 (600 CA) MG PO TABS
1500.0000 mg | ORAL_TABLET | Freq: Every day | ORAL | Status: DC
  Filled 2017-08-24 (×3): qty 1

## 2017-08-24 MED ORDER — PANTOPRAZOLE SODIUM 40 MG PO TBEC
40.0000 mg | DELAYED_RELEASE_TABLET | Freq: Every day | ORAL | Status: DC
  Administered 2017-08-24 – 2017-08-29 (×6): 40 mg via ORAL
  Filled 2017-08-24 (×6): qty 1

## 2017-08-24 NOTE — ED Triage Notes (Signed)
Pt. By EMS for SOB since Sunday. Pt. Used inhaler without relief. Pt. Given 2 neb. And 125 of solumedrol. SPO2 88% on room air.

## 2017-08-24 NOTE — ED Provider Notes (Signed)
[facility]ERGENCY DEPARTMENT Provider Note   CSN: [id number] Arrival date & time: 08/24/17  2595     History   Chief Complaint No chief complaint on file.   HPI [name] is a 71 y.o. female.  Patient with history of COPD on 3 L oxygen mainly at night, diastolic congestive heart failure on 40 mg Lasix twice a day (EF=60-65% in 5/18), history of pneumonia --presents with complaints of shortness of breath starting 3 days ago.  Patient started with some sinus congestion which she states then "moved down into my chest".  Patient uses home albuterol treatments 3 times a day.  Her breathing worsens.  She denies significant lower extremity edema.  No fevers.  Cough is nonproductive.  No chest pain or abdominal pain.  No nausea, vomiting, diarrhea.  No urinary symptoms.  Patient thinks that her symptoms feel most like bronchitis or pneumonia. The onset of this condition was acute. The course is worsening. Aggravating factors: none. Alleviating factors: none.        Past Medical History:  Diagnosis Date  . Abnormal LFTs 08/02/2011  . Acute liver failure 06/19/2013  . Acute renal failure ([place]) 06/19/2013  . Acute respiratory failure ([place]) 06/19/2013  . Altered mental status 08/01/2011  . Angina   . Anxiety   . Anxiety state, unspecified 12/03/2013  . Aortic stenosis    mild  . Arthritis    "hands" (02/03/2017)  . Breast cancer of upper-outer quadrant of left female breast ([place]) 08/21/2013  . Breast cancer, right breast ([place]) dx'd 2008  . CAD (coronary artery disease) of artery bypass graft 11/06/2013  . CAD (coronary artery disease), MI R/O 08/01/2011   PCI in 2006 (bare metal stent, unknown artery) - [place]   . CHF,  acute diastolic, BNP 4k on admissio 08/01/2011  . Chronic bronchitis ([place])   . Chronic respiratory failure ([place]) 02/02/2012  . Compression fracture of L1 lumbar vertebra ([place]) 02/02/2012  . COPD (chronic obstructive pulmonary disease) (HCC)    oxygen-dependent 4LPM [place]  . Coronary artery disease 2006   2 stents w/previous MI  . Depressed   . Diastolic CHF ([place])   . Dyslipidemia   . Family history of adverse reaction to anesthesia    "daughter had c-section; missed twice w/epidural"  . GERD (gastroesophageal reflux disease)   . Heart murmur   . History of blood transfusion    "w/my colon OR"  . 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 History of nuclear stress test 08/03/2011   attenuation at apex - no perfusion defects   . HTN (hypertension) 11/06/2013  . Hyperkalemia, on ACE prior to admission 11/11/2011  . Hypertension   . Hyponatremia 01/31/2012  . Migraine    "qod to q couple months since I was 21" (02/03/2017)  . Mild aortic stenosis 08/01/2011   AVA 1.69 cm2 (06/02/2011)   . Moderate to severe pulmonary hypertension ([place])   . NSTEMI (non-ST elevated myocardial infarction) ([place]) 2006  . NSVT (nonsustained ventricular tachycardia) (HCC)    h/o  . On home oxygen therapy    "3L just at night; have it available prn" (02/03/2017)  . Pneumonia    "alot of times" (02/03/2017)    Patient Active Problem List   Diagnosis Date Noted  . Insomnia due to anxiety and fear 08/22/2017  . Oxygen dependent 08/16/2017  . Essential hypertension 06/20/2017  . Right middle lobe pneumonia ([place]) 02/03/2017  . Dyslipidemia 02/03/2017  . Other emphysema ([place])  06/14/2014  . Coronary artery disease involving native coronary artery of native heart without angina pectoris 11/06/2013  . Breast cancer of upper-outer quadrant of left female breast ([place]) 08/21/2013  . Osteoporosis 01/01/2013  . Chronic diastolic CHF (congestive heart failure) ([place]) 01/31/2012  . Aortic valve stenosis 08/01/2011    Class: Chronic    Past Surgical History:  Procedure Laterality Date  . BREAST BIOPSY Right 2008  . BREAST LUMPECTOMY Right 2008   malignant  . CATARACT EXTRACTION W/ INTRAOCULAR LENS  IMPLANT, BILATERAL Bilateral ~ 2013  . [place];  1971; 1978  . CHOLECYSTECTOMY OPEN  2011  . COLON SURGERY    . COLOSTOMY  11/2007  . COLOSTOMY CLOSURE  07/2008  . CORONARY ANGIOPLASTY WITH STENT PLACEMENT  2006   "2 stents"  . ERCP N/A 09/05/2015   Procedure: ENDOSCOPIC RETROGRADE CHOLANGIOPANCREATOGRAPHY (ERCP);  Surgeon: Ladene Artist, MD;  Location: [facility];  Service: Endoscopy;  Laterality: N/A;  . PARTIAL COLECTOMY  2009   for obstruction: temporary ostomy, later reversed.   Marland Kitchen RIGHT HEART CATHETERIZATION N/A 09/05/2013   Procedure: RIGHT HEART CATH;  Surgeon: Jolaine Artist, MD;  Location: [facility] CATH LAB;  Service: Cardiovascular;  Laterality: N/A;  . TRANSTHORACIC ECHOCARDIOGRAM  11/12/2011   EF 01-60%, normal systolic function, grade 1 diastolic dysfunction; ventricular septal flattening (D-sign); mild AS; trace-mild MR; LA mildly dilated; RV mod dilated; RA mod dilated; severe pulm HTN; elevated CVP    OB History    No data available       Home Medications    Prior to Admission medications   Medication Sig Start Date End Date Taking? Authorizing Provider  albuterol (VENTOLIN HFA) 108 (90 Base) MCG/ACT inhaler INHALE 2 PUFFS INTO THE LUNGS EVERY 4 HOURS AS NEEDED 05/31/17   [name], MD  ALPRAZolam Duanne Moron) 1 MG tablet TAKE 1 TABLET BY MOUTH TWICE A DAY AS NEEDED 07/11/17   [name], MD  Artificial Tear Ointment (DRY EYES OP) Apply 2 drops to eye daily as needed.    [provider]  aspirin 325 MG EC tablet Take 1 tablet (325 mg total) by mouth every morning. 09/21/15   Velvet Bathe, MD  ATROVENT HFA 17 MCG/ACT inhaler USE 2 PUFFS INTO THE LUNGS EVERY 4 HOURS AS NEEDED 05/31/17   [name], MD  butalbital-acetaminophen-caffeine (FIORICET, ESGIC) [phone] MG tablet TAKE 1 TO 2 TABLETS BY MOUTH AS NEEDED FOR HEADACHE 03/31/17   [name], MD  Calcium Carbonate (CALCIUM 600 PO) Take by mouth daily.    [provider]  cholecalciferol (VITAMIN D) 1000 units tablet Take 1,000 Units  by mouth daily.    [provider]  diclofenac sodium (VOLTAREN) 1 % GEL APPLY 4 G TOPICALLY DAILY AS NEEDED (APPLY TO HANDS). HAND PAIN 08/04/15   [provider]  fluticasone (FLONASE) 50 MCG/ACT nasal spray USE 2 SPRAYS INTO EACH NOSTRIL ONCE DAILY**REPEAT FOR 5 DAYS THEN STOP 04/18/16   [name], MD  furosemide (LASIX) 40 MG tablet TAKE 1 TABLET BY MOUTH TWICE A DAY 07/11/17   [name], MD  ipratropium-albuterol (DUONEB) 0.5-2.5 (3) MG/3ML SOLN Take 3 mLs by nebulization every 4 (four) hours as needed. 02/06/17   Jani Gravel, MD  lactulose, encephalopathy, (CHRONULAC) 10 GM/15ML SOLN TAKE 30MLS BY MOUTH 3 TIMES A DAY AS NEEDED FOR CONSTIPATION 05/23/17   [name], MD  levocetirizine (XYZAL) 5 MG tablet Take 1 tablet (5 mg total)  by mouth every evening. 02/02/17   [name], MD  LIVALO 2 MG TABS TAKE 1 TABLET BY MOUTH EVERY DAY 06/06/17   [name], MD  montelukast (SINGULAIR) 10 MG tablet Take 1 tablet (10 mg total) by mouth at bedtime. 02/02/17   [name], MD  nortriptyline (PAMELOR) 25 MG capsule Take 2 capsules (50 mg total) by mouth at bedtime. 08/22/17   [name], MD  Omega 3 1200 MG CAPS Take 1,200 mg by mouth every morning.     [provider]  ondansetron (ZOFRAN) 8 MG tablet Take 1 tablet (8 mg total) by mouth every 8 (eight) hours as needed for nausea or vomiting. 05/16/17   [name], MD  OXYGEN-HELIUM IN Inhale 3 L into the lungs See admin instructions. Uses when needed during the day, uses continuous throughout the night    [provider]  pantoprazole (PROTONIX) 40 MG tablet TAKE 1 TABLET BY MOUTH ONCE A DAY 09/29/16   [name], MD  potassium chloride SA (KLOR-CON M20) 20 MEQ tablet Take 1 tablet (20 mEq total) by mouth daily. 06/16/17   [name], MD  SYMBICORT 160-4.5 MCG/ACT inhaler INHALE 2 PUFFS INTO THE LUNGS 2 (TWO) TIMES DAILY. 10/18/16   [name], MD  vitamin C (ASCORBIC ACID) 500  MG tablet Take 500 mg by mouth daily.    [provider]    Family History Family History  Problem Relation Age of Onset  . Schizophrenia Sister   . Alzheimer's disease Mother   . Hyperlipidemia Brother   . Heart disease Sister   . Diabetes Sister   . Cancer Neg Hx   . Stroke Neg Hx   . COPD Neg Hx   . Depression Neg Hx   . Drug abuse Neg Hx   . Early death Neg Hx   . Hypertension Neg Hx   . Kidney disease Neg Hx     Social History Social History   Tobacco Use  . Smoking status: Current Some Day Smoker    Packs/day: 0.10    Years: 52.00    Pack years: 5.20    Types: Cigarettes  . Smokeless tobacco: Never Used  . Tobacco comment: 02/03/2017 'probably 15 cigarettes/week"  Substance Use Topics  . Alcohol use: Yes    Alcohol/week: 0.6 oz    Types: 1 Glasses of wine per week  . Drug use: No     Allergies   Ceftriaxone; Hydroxyzine; Doxycycline; and Lexapro [escitalopram]   Review of Systems Review of Systems  Constitutional: Negative for diaphoresis and fever.  HENT: Positive for congestion. Negative for rhinorrhea and sore throat.   Eyes: Negative for redness.  Respiratory: Positive for cough, shortness of breath and wheezing.   Cardiovascular: Negative for chest pain, palpitations and leg swelling.  Gastrointestinal: Negative for abdominal pain, diarrhea, nausea and vomiting.  Genitourinary: Negative for dysuria.  Musculoskeletal: Negative for back pain, myalgias and neck pain.  Skin: Negative for rash.  Neurological: Negative for syncope, light-headedness and headaches.  Psychiatric/Behavioral: The patient is not nervous/anxious.      Physical Exam Updated Vital Signs BP (!) 98/53 (BP Location: Left Arm)   Pulse 88   Temp 97.8 F (36.6 C) (Axillary)   Resp (!) 21   Ht 5\' 2"  (1.575 m)   Wt 55.3 kg (122 lb)   SpO2 98%   BMI 22.31 kg/m   Physical Exam  Constitutional: She appears well-developed and well-nourished.  HENT:  Head:  Normocephalic and atraumatic.  Mouth/Throat: Oropharynx is clear and moist.  Eyes: Conjunctivae are normal. Right eye exhibits no discharge. Left eye exhibits no discharge.  Neck: Normal range of motion. Neck supple.  Cardiovascular: Normal rate, regular rhythm and normal heart sounds.  No murmur heard. Pulmonary/Chest: Effort normal. No respiratory distress. She has wheezes (Scattered expiratory wheezing throughout, moderate). She has rales (Mild crackles at bases).  Abdominal: Soft. There is no tenderness.  Musculoskeletal: She exhibits no edema.  No significant lower extremity edema  Neurological: She is alert.  Skin: Skin is warm and dry.  Psychiatric: She has a normal mood and affect.  Nursing note and vitals reviewed.    ED Treatments / Results  Labs (all labs ordered are listed, but only abnormal results are displayed) Labs Reviewed  BASIC METABOLIC PANEL - Abnormal; Notable for the following components:      Result Value   Chloride 98 (*)    Glucose, Bld 112 (*)    Creatinine, Ser 1.15 (*)    GFR calc non Af Amer 47 (*)    GFR calc Af Amer 54 (*)    All other components within normal limits  CBC - Abnormal; Notable for the following components:   RBC 3.67 (*)    All other components within normal limits  RESPIRATORY PANEL BY PCR  BRAIN NATRIURETIC PEPTIDE  I-STAT TROPONIN, ED  I-STAT TROPONIN, ED    EKG  EKG Interpretation  Date/Time:  Wednesday August 24 2017 06:45:39 EST Ventricular Rate:  82 PR Interval:    QRS Duration: 84 QT Interval:  384 QTC Calculation: 449 R Axis:   39 Text Interpretation:  Sinus rhythm Baseline wander in lead(s) II III aVR aVF Confirmed by Pryor Curia [phone]) on 08/24/2017 7:17:29 AM       Radiology Dg Chest 2 View  Result Date: 08/24/2017 CLINICAL DATA:  Shortness of breath for 3 days. EXAM: CHEST  2 VIEW COMPARISON:  Radiograph 02/24/2017, CT 01/01/2015 FINDINGS: Similar cardiomegaly. Aortic atherosclerosis. Pulmonary  edema with minimal progression. Right middle lobe and lingular scarring, as seen on CT. No consolidation. Possible trace right pleural effusion. Right apical pleuroparenchymal scarring. No pneumothorax or acute osseous abnormality. IMPRESSION: Pulmonary edema, with mild increase from prior and possible small right pleural effusion. Stable cardiomegaly. Electronically Signed   By: Jeb Levering M.D.   On: 08/24/2017 05:18    Procedures Procedures (including critical care time)  Medications Ordered in ED Medications  ipratropium-albuterol (DUONEB) 0.5-2.5 (3) MG/3ML nebulizer solution 3 mL (not administered)  budesonide (PULMICORT) nebulizer solution 0.25 mg (not administered)  methylPREDNISolone sodium succinate (SOLU-MEDROL) 125 mg/2 mL injection 60 mg (not administered)  albuterol (PROVENTIL) (2.5 MG/3ML) 0.083% nebulizer solution 5 mg (5 mg Nebulization Given 08/24/17 0625)  ipratropium (ATROVENT) nebulizer solution 0.5 mg (0.5 mg Nebulization Given 08/24/17 0625)  albuterol (PROVENTIL) (2.5 MG/3ML) 0.083% nebulizer solution 5 mg (5 mg Nebulization Given 08/24/17 0756)  ipratropium (ATROVENT) nebulizer solution 0.5 mg (0.5 mg Nebulization Given 08/24/17 0756)     Initial Impression / Assessment and Plan / ED Course  I have reviewed the triage vital signs and the nursing notes.  Pertinent labs & imaging results that were available during my care of the patient were reviewed by me and considered in my medical decision making (see chart for details).     Patient seen and examined. Work-up initiated. Medications ordered.   Vital signs reviewed and are as follows: BP (!) 98/53 (BP Location: Left Arm)  Pulse 88   Temp 97.8 F (36.6 C) (Axillary)   Resp (!) 21   Ht 5\' 2"  (1.575 m)   Wt 55.3 kg (122 lb)   SpO2 98%   BMI 22.31 kg/m   Patient in no significant distress however does have significant wheezing.  X-ray suggestive of edema.  Picture appears mixed at this point. BNP  pending.   8:26 AM BNP is normal.  Patient continues to have significant wheezing after additional breathing treatments.  After discussion with patient, feel that admission is reasonable.  Patient discussed with and seen by Dr. Leonides Schanz.  Will discuss with [facility].  8:50 AM Spoke with Lissa Merlin NP of [facility] who will see patient.    Final Clinical Impressions(s) / ED Diagnoses   Final diagnoses:  COPD with acute exacerbation (HCC)   COPD -- wheezing, SOB, increased use of O2  CHF -- not clinically fluid overloaded, edema on xray, normal BNP.    ED Discharge Orders    None       Carlisle Cater, [place] 08/24/17 [id number]

## 2017-08-24 NOTE — ED Provider Notes (Signed)
Medical screening examination/treatment/procedure(s) were conducted as a shared visit with non-physician practitioner(s) and myself.  I personally evaluated the patient during the encounter.   EKG Interpretation  Date/Time:  Wednesday August 24 2017 06:45:39 EST Ventricular Rate:  82 PR Interval:    QRS Duration: 84 QT Interval:  384 QTC Calculation: 449 R Axis:   39 Text Interpretation:  Sinus rhythm Baseline wander in lead(s) II III aVR aVF Confirmed by Pryor Curia [phone]) on 08/24/2017 7:17:29 AM      Patient is a 71 year old female with history of CHF and COPD who is on home oxygen intermittently who presents to the emergency department shortness of breath and wheezing.  Seems to be a mixed picture of COPD and CHF.  X-ray shows mild pulmonary edema but no pneumonia.  Improving with breathing treatments.  Patient states she would feel better with admission which I feel is reasonable for COPD exacerbation and CHF exacerbation.   Alden Bensinger, Delice Bison, DO 08/24/17 [phone]

## 2017-08-24 NOTE — ED Notes (Signed)
Walked patient to the bath patient did well patient is back in the bed

## 2017-08-24 NOTE — H&P (Signed)
History and Physical    [name] DZH:[id number] DOB: 1946/02/19 DOA: 08/24/2017   PCP: [name], MD   Attending physician: Denton Brick  Patient coming from/Resides with: Private residence/daughter  Chief Complaint: SOB  HPI: [name] is a 71 y.o. female with medical history significant for COPD, ongoing tobacco abuse, chronic diastolic heart failure with severe pulmonary hypertension/mild aortic stenosis, anxiety disorder, hypertension, dyslipidemia, and chronic hypoxemia on nocturnal oxygen.  Patient was in her usual state of health when she developed increasing shortness of breath without cough fevers or chills on Sunday.  She has increased her O2 usage from at hour of sleep only to continuously during the day.  Room air saturations were 88%.  Patient with extensive wheezing and decreased airway movement despite IV steroids and nebulizer treatments.  Chest x-ray without edema or pneumonia.  Patient will be admitted for COPD exacerbation.  ED Course:  Vital Signs: BP (!) 106/56   Pulse 95   Temp 97.8 F (36.6 C) (Axillary)   Resp 19   Ht 5\' 2"  (1.575 m)   Wt 55.3 kg (122 lb)   SpO2 98%   BMI 22.31 kg/m  Chest x-ray: Read as pulmonary edema but when compared to previous x-rays appearance of chest x-ray more consistent with chronic interstitial changes secondary to COPD with changes more visible secondary to patient's small stature and low weight. Lab data: Sodium 138, potassium 2.9, chloride 98, CO2 32, glucose 112, BUN 14, creatinine 1.15, anion gap 8, BNP 36, poc troponin 0 0.00, white count 8600 differential not obtained, hemoglobin 12, platelets 194,000 Medications and treatments: EMS administered Solu-Medrol 125 mg IV, and 2 unspecified nebulizer treatments.;  In the ER patient has been given 2 albuterol nebulizers, 2 Atrovent nebulizers  Review of Systems:  In addition to the HPI above,  No Fever-chills, myalgias or other constitutional symptoms No  Headache, changes with Vision or hearing, new weakness, tingling, numbness in any extremity, dizziness, dysarthria or word finding difficulty, gait disturbance or imbalance, tremors or seizure activity No problems swallowing food or Liquids, indigestion/reflux, choking or coughing while eating, abdominal pain with or after eating No Chest pain, Cough, palpitations, orthopnea  No Abdominal pain, N/V, melena,hematochezia, dark tarry stools, constipation No dysuria, malodorous urine, hematuria or flank pain No new skin rashes, lesions, masses or bruises, No new joint pains, aches, swelling or redness No recent unintentional weight gain or loss No polyuria, polydypsia or polyphagia   Past Medical History:  Diagnosis Date  . Abnormal LFTs 08/02/2011  . Acute liver failure 06/19/2013  . Acute renal failure ([place]) 06/19/2013  . Acute respiratory failure ([place]) 06/19/2013  . Altered mental status 08/01/2011  . Angina   . Anxiety   . Anxiety state, unspecified 12/03/2013  . Aortic stenosis    mild  . Arthritis    "hands" (02/03/2017)  . Breast cancer of upper-outer quadrant of left female breast ([place]) 08/21/2013  . Breast cancer, right breast ([place]) dx'd 2008  . CAD (coronary artery disease) of artery bypass graft 11/06/2013  . CAD (coronary artery disease), MI R/O 08/01/2011   PCI in 2006 (bare metal stent, unknown artery) - [place]   . CHF,  acute diastolic, BNP 4k on admissio 08/01/2011  . Chronic bronchitis ([place])   . Chronic respiratory failure ([place]) 02/02/2012  . Compression fracture of L1 lumbar vertebra ([place]) 02/02/2012  . COPD (chronic obstructive pulmonary disease) ([facility])    oxygen-dependent 4LPM [place]  . Coronary artery disease 2006  2 stents w/previous MI  . Depressed   . Diastolic CHF ([place])   . Dyslipidemia   . Family history of adverse reaction to anesthesia    "daughter had c-section; missed twice w/epidural"  . GERD (gastroesophageal reflux disease)   . Heart murmur   . History of blood  transfusion    "w/my colon OR"  . 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 History of nuclear stress test 08/03/2011   attenuation at apex - no perfusion defects   . HTN (hypertension) 11/06/2013  . Hyperkalemia, on ACE prior to admission 11/11/2011  . Hypertension   . Hyponatremia 01/31/2012  . Migraine    "qod to q couple months since I was 21" (02/03/2017)  . Mild aortic stenosis 08/01/2011   AVA 1.69 cm2 (06/02/2011)   . Moderate to severe pulmonary hypertension ([place])   . NSTEMI (non-ST elevated myocardial infarction) ([place]) 2006  . NSVT (nonsustained ventricular tachycardia) (H[facility])    h/o  . On home oxygen therapy    "3L just at night; have it available prn" (02/03/2017)  . Pneumonia    "alot of times" (02/03/2017)    Past Surgical History:  Procedure Laterality Date  . BREAST BIOPSY Right 2008  . BREAST LUMPECTOMY Right 2008   malignant  . CATARACT EXTRACTION W/ INTRAOCULAR LENS  IMPLANT, BILATERAL Bilateral ~ 2013  . [place]; 1971; 1978  . CHOLECYSTECTOMY OPEN  2011  . COLON SURGERY    . COLOSTOMY  11/2007  . COLOSTOMY CLOSURE  07/2008  . CORONARY ANGIOPLASTY WITH STENT PLACEMENT  2006   "2 stents"  . ERCP N/A 09/05/2015   Procedure: ENDOSCOPIC RETROGRADE CHOLANGIOPANCREATOGRAPHY (ERCP);  Surgeon: Ladene Artist, MD;  Location: [facility];  Service: Endoscopy;  Laterality: N/A;  . PARTIAL COLECTOMY  2009   for obstruction: temporary ostomy, later reversed.   Marland Kitchen RIGHT HEART CATHETERIZATION N/A 09/05/2013   Procedure: RIGHT HEART CATH;  Surgeon: Jolaine Artist, MD;  Location: [facility] CATH LAB;  Service: Cardiovascular;  Laterality: N/A;  . TRANSTHORACIC ECHOCARDIOGRAM  11/12/2011   EF 37-90%, normal systolic function, grade 1 diastolic dysfunction; ventricular septal flattening (D-sign); mild AS; trace-mild MR; LA mildly dilated; RV mod dilated; RA mod dilated; severe pulm HTN; elevated CVP    Social History   Socioeconomic History  . Marital status: Widowed     Spouse name: Not on file  . Number of children: 3  . Years of education: 64  . Highest education level: Not on file  Social Needs  . Financial resource strain: Not on file  . Food insecurity - worry: Not on file  . Food insecurity - inability: Not on file  . Transportation needs - medical: Not on file  . Transportation needs - non-medical: Not on file  Occupational History  . Not on file  Tobacco Use  . Smoking status: Current Some Day Smoker    Packs/day: 0.10    Years: 52.00    Pack years: 5.20    Types: Cigarettes  . Smokeless tobacco: Never Used  . Tobacco comment: 02/03/2017 'probably 15 cigarettes/week"  Substance and Sexual Activity  . Alcohol use: Yes    Alcohol/week: 0.6 oz    Types: 1 Glasses of wine per week  . Drug use: No  . Sexual activity: Not Currently  Other Topics Concern  . Not on file  Social History Narrative  . Not on file    Mobility: Independent Work history: Not obtained   Allergies  Allergen Reactions  . Ceftriaxone Anaphylaxis and Other (See Comments)    *ROCEPHIN*  "Blow up like a balloon"  . Hydroxyzine Shortness Of Breath and Other (See Comments)    Pt states med make her light headed, get sob sxs  . Doxycycline Nausea And Vomiting  . Lexapro [Escitalopram] Other (See Comments)    Pt states med make her dizzy    Family History  Problem Relation Age of Onset  . Schizophrenia Sister   . Alzheimer's disease Mother   . Hyperlipidemia Brother   . Heart disease Sister   . Diabetes Sister   . Cancer Neg Hx   . Stroke Neg Hx   . COPD Neg Hx   . Depression Neg Hx   . Drug abuse Neg Hx   . Early death Neg Hx   . Hypertension Neg Hx   . Kidney disease Neg Hx      Prior to Admission medications   Medication Sig Start Date End Date Taking? Authorizing Provider  albuterol (VENTOLIN HFA) 108 (90 Base) MCG/ACT inhaler INHALE 2 PUFFS INTO THE LUNGS EVERY 4 HOURS AS NEEDED Patient taking differently: INHALE 2 PUFFS INTO THE LUNGS EVERY 4  HOURS AS NEEDED FOR SOB 05/31/17  Yes [name], MD  ALPRAZolam (XANAX) 1 MG tablet TAKE 1 TABLET BY MOUTH TWICE A DAY AS NEEDED Patient taking differently: TAKE 1 TABLET BY MOUTH TWICE A DAY AS NEEDED FOR ANXIETY. 07/11/17  Yes [name], MD  aspirin 325 MG EC tablet Take 1 tablet (325 mg total) by mouth every morning. 09/21/15  Yes Velvet Bathe, MD  ATROVENT HFA 17 MCG/ACT inhaler USE 2 PUFFS INTO THE LUNGS EVERY 4 HOURS AS NEEDED Patient taking differently: USE 2 PUFFS INTO THE LUNGS EVERY 4 HOURS AS NEEDED FOR SOB 05/31/17  Yes [name], MD  butalbital-acetaminophen-caffeine (FIORICET, ESGIC) [id number] MG tablet TAKE 1 TO 2 TABLETS BY MOUTH AS NEEDED FOR HEADACHE 03/31/17  Yes [name], MD  Calcium Carbonate (CALCIUM 600 PO) Take 1 tablet by mouth daily.    Yes [provider]  cholecalciferol (VITAMIN D) 1000 units tablet Take 1,000 Units by mouth daily.   Yes [provider]  diclofenac sodium (VOLTAREN) 1 % GEL APPLY 4 G TOPICALLY DAILY AS NEEDED (APPLY TO HANDS). HAND PAIN 08/04/15  Yes [provider]  fluticasone (FLONASE) 50 MCG/ACT nasal spray USE 2 SPRAYS INTO EACH NOSTRIL ONCE DAILY**REPEAT FOR 5 DAYS THEN STOP Patient taking differently: USE 2 SPRAYS INTO EACH NOSTRIL ONCE DAILY AS NEEDED FOR ALLERGIES **REPEAT FOR 5 DAYS THEN STOP 04/18/16  Yes [name], MD  furosemide (LASIX) 40 MG tablet TAKE 1 TABLET BY MOUTH TWICE A DAY 07/11/17  Yes [name], MD  ipratropium-albuterol (DUONEB) 0.5-2.5 (3) MG/3ML SOLN Take 3 mLs by nebulization every 4 (four) hours as needed. Patient taking differently: Take 3 mLs by nebulization every 4 (four) hours as needed (SOB).  02/06/17  Yes Jani Gravel, MD  lactulose, encephalopathy, (CHRONULAC) 10 GM/15ML SOLN TAKE 30MLS BY MOUTH 3 TIMES A DAY AS NEEDED FOR CONSTIPATION 05/23/17  Yes [name], MD  levocetirizine (XYZAL) 5 MG tablet Take 1 tablet (5 mg total) by mouth every evening. 02/02/17   Yes [name], MD  LIVALO 2 MG TABS TAKE 1 TABLET BY MOUTH EVERY DAY 06/06/17  Yes [name], MD  montelukast (SINGULAIR) 10 MG tablet Take 1 tablet (10 mg total) by mouth at bedtime. 02/02/17  Yes [name], MD  nortriptyline (PAMELOR) 25 MG capsule Take 2 capsules (50 mg total) by mouth at bedtime. 08/22/17  Yes [name], MD  Omega 3 1200 MG CAPS Take 1,200 mg by mouth every morning.    Yes [provider]  ondansetron (ZOFRAN) 8 MG tablet Take 1 tablet (8 mg total) by mouth every 8 (eight) hours as needed for nausea or vomiting. 05/16/17  Yes [name], MD  OXYGEN-HELIUM IN Inhale 3 L into the lungs See admin instructions. Uses when needed during the day, uses continuous throughout the night   Yes [provider]  pantoprazole (PROTONIX) 40 MG tablet TAKE 1 TABLET BY MOUTH ONCE A DAY 09/29/16  Yes [name], MD  potassium chloride SA (KLOR-CON M20) 20 MEQ tablet Take 1 tablet (20 mEq total) by mouth daily. 06/16/17  Yes [name], MD  SYMBICORT 160-4.5 MCG/ACT inhaler INHALE 2 PUFFS INTO THE LUNGS 2 (TWO) TIMES DAILY. 10/18/16  Yes [name], MD  vitamin C (ASCORBIC ACID) 500 MG tablet Take 500 mg by mouth daily.   Yes [provider]  Artificial Tear Ointment (DRY EYES OP) Apply 2 drops to eye daily as needed (dry eyes).     [provider]    Physical Exam: Vitals:   08/24/17 0815 08/24/17 0830 08/24/17 0845 08/24/17 0900  BP: (!) 113/57 (!) 110/56 (!) 113/56 (!) 106/56  Pulse: 86 92 94 95  Resp: 19 18 18 19   Temp:      TempSrc:      SpO2: 97% 96% 98% 98%  Weight:      Height:          Constitutional: NAD, calm, comfortable Eyes: PERRL, lids and conjunctivae normal ENMT: Mucous membranes are dry. Posterior pharynx clear of any exudate or lesions.age-appropriate dentition.  Neck: normal, supple, no masses, no thyromegaly Respiratory: Lung sounds diminished throughout primarily the mid fields to the  bases.  Faint expiratory crackles with scattered wheezes.  Normal respiratory effort without accessory muscle use at rest.  [place] Cardiovascular: Regular rate and rhythm, no murmurs / rubs / gallops. No extremity edema. 2+ pedal pulses. No carotid bruits.  Abdomen: no tenderness, no masses palpated. No hepatosplenomegaly. Bowel sounds positive.  Musculoskeletal: no clubbing / cyanosis. No joint deformity upper and lower extremities. Good ROM, no contractures. Normal muscle tone.  Skin: no rashes, lesions, ulcers. No induration Neurologic: CN 2-12 grossly intact. Sensation intact, DTR normal. Strength 5/5 x all 4 extremities.  Psychiatric: Normal judgment and insight. Alert and oriented x 3. Normal mood.    Labs on Admission: I have personally reviewed following labs and imaging studies  CBC: Recent Labs  Lab 08/24/17 0450  WBC 8.6  HGB 12.0  HCT 36.5  MCV 99.5  PLT 539   Basic Metabolic Panel: Recent Labs  Lab 08/24/17 0450  NA 138  K 3.9  CL 98*  CO2 32  GLUCOSE 112*  BUN 14  CREATININE 1.15*  CALCIUM 9.4   GFR: Estimated Creatinine Clearance: 35.5 mL/min (A) (by C-G formula based on SCr of 1.15 mg/dL (H)). Liver Function Tests: No results for input(s): AST, ALT, ALKPHOS, BILITOT, PROT, ALBUMIN in the last 168 hours. No results for input(s): LIPASE, AMYLASE in the last 168 hours. No results for input(s): AMMONIA in the last 168 hours. Coagulation Profile: No results for input(s): INR, PROTIME in the last 168 hours. Cardiac Enzymes: No results for input(s): CKTOTAL, CKMB, CKMBINDEX, TROPONINI in  the last 168 hours. BNP (last 3 results) No results for input(s): PROBNP in the last 8760 hours. HbA1C: No results for input(s): HGBA1C in the last 72 hours. CBG: No results for input(s): GLUCAP in the last 168 hours. Lipid Profile: No results for input(s): CHOL, HDL, LDLCALC, TRIG, CHOLHDL, LDLDIRECT in the last 72 hours. Thyroid Function Tests: No results for input(s):  TSH, T4TOTAL, FREET4, T3FREE, THYROIDAB in the last 72 hours. Anemia Panel: No results for input(s): VITAMINB12, FOLATE, FERRITIN, TIBC, IRON, RETICCTPCT in the last 72 hours. Urine analysis:    Component Value Date/Time   COLORURINE YELLOW 12/19/2013 1127   APPEARANCEUR CLEAR 12/19/2013 1127   LABSPEC <=1.005 (A) 12/19/2013 1127   PHURINE 7.0 12/19/2013 1127   GLUCOSEU NEGATIVE 12/19/2013 1127   HGBUR NEGATIVE 12/19/2013 1127   BILIRUBINUR NEGATIVE 12/19/2013 1127   KETONESUR NEGATIVE 12/19/2013 1127   PROTEINUR >300 (A) 06/20/2013 0059   UROBILINOGEN 0.2 12/19/2013 1127   NITRITE NEGATIVE 12/19/2013 1127   LEUKOCYTESUR MODERATE (A) 12/19/2013 1127   Sepsis Labs: @LABRCNTIP (procalcitonin:4,lacticidven:4) )No results found for this or any previous visit (from the past 240 hour(s)).   Radiological Exams on Admission: Dg Chest 2 View  Result Date: 08/24/2017 CLINICAL DATA:  Shortness of breath for 3 days. EXAM: CHEST  2 VIEW COMPARISON:  Radiograph 02/24/2017, CT 01/01/2015 FINDINGS: Similar cardiomegaly. Aortic atherosclerosis. Pulmonary edema with minimal progression. Right middle lobe and lingular scarring, as seen on CT. No consolidation. Possible trace right pleural effusion. Right apical pleuroparenchymal scarring. No pneumothorax or acute osseous abnormality. IMPRESSION: Pulmonary edema, with mild increase from prior and possible small right pleural effusion. Stable cardiomegaly. Electronically Signed   By: Jeb Levering M.D.   On: 08/24/2017 05:18    EKG: (Independently reviewed) sinus rhythm with ventricular rate 82 bpm, QTC 449 ms, normal R wave rotation, incomplete right bundle branch block, no ischemic changes  Assessment/Plan Principal Problem:   COPD with acute exacerbation  -Presents with shortness of breath times 4 days and increased O2 requirement (baseline is chronic HS O2) with clinical exam consistent with COPD exacerbation -Scheduled DuoNeb -Budesonide  neb -IV Solu-Medrol 60 mg IV every 12 hours -Supportive care with oxygen -Continue Singulair -Continues to smoke-defers nicotine patch when offered -Respiratory viral panel -Also has underlying severe pulmonary hypertension (diagnosed on right heart catheterization 2014)  Active Problems:   Chronic respiratory failure with hypoxia  -3 L oxygen at hour of sleep now requiring continuous -If has underlying viral etiology to COPD exacerbation likely will have slower recovery time and may need to discharge home on continuous oxygen with eventual transition back to at bedtime utilization -Serum CO2 32 so likely CO2 retainer so cautious up titration of oxygen    Chronic diastolic heart failure  -Clinically compensated and actually is somewhat dry (see below) -Hold home Lasix/Kdur -Not on ACE inhibitor or beta-blocker prior to admission -Daily weight, strict I/O -Echocardiogram May 2018: EF 60-65% with grade 1 diastolic dysfunction, mild aortic stenosis and normal RV systolic function    HTN (hypertension) -Current blood pressure somewhat suboptimal in the 90 range -Not on antihypertensive agents prior to admission    Acute kidney injury  -Likely secondary to insensible fluid losses from acute COPD exacerbation -Baseline renal function: 8/0.83 -Current renal function: 14/1.15 -Hold diuretic and encourage oral fluids -We will defer IV fluids to avoid possible precipitation of heart failure exacerbation -Follow labs    HLD -Continue omega-3 fatty acids and Livalo    Anxiety -Continue Xanax prn  Coronary artery disease -Followed by Dr. Debara Pickett -Low risk stress test November 2012 -Remote history of prior PCI      DVT prophylaxis: Lovenox Code Status: Full Family Communication: No family at bedside Disposition Plan: Home Consults called: None    Tedrick Port L. ANP-BC [facility] Pager [phone]   If 7PM-7AM, please contact  night-coverage [email or web address] Password [email or web address]  08/24/2017, 9:11 AM

## 2017-08-25 DIAGNOSIS — N179 Acute kidney failure, unspecified: Secondary | ICD-10-CM

## 2017-08-25 DIAGNOSIS — I10 Essential (primary) hypertension: Secondary | ICD-10-CM

## 2017-08-25 DIAGNOSIS — F419 Anxiety disorder, unspecified: Secondary | ICD-10-CM

## 2017-08-25 LAB — COMPREHENSIVE METABOLIC PANEL
ALT: 20 U/L (ref 14–54)
AST: 27 U/L (ref 15–41)
Albumin: 3.3 g/dL — ABNORMAL LOW (ref 3.5–5.0)
Alkaline Phosphatase: 55 U/L (ref 38–126)
Anion gap: 7 (ref 5–15)
BUN: 12 mg/dL (ref 6–20)
CO2: 31 mmol/L (ref 22–32)
Calcium: 10 mg/dL (ref 8.9–10.3)
Chloride: 100 mmol/L — ABNORMAL LOW (ref 101–111)
Creatinine, Ser: 0.91 mg/dL (ref 0.44–1.00)
GFR calc Af Amer: >60 mL/min (ref >60)
GFR calc non Af Amer: >60 mL/min (ref >60)
Glucose, Bld: 112 mg/dL — ABNORMAL HIGH (ref 65–99)
Potassium: 4.1 mmol/L (ref 3.5–5.1)
Sodium: 138 mmol/L (ref 135–145)
Total Bilirubin: 0.4 mg/dL (ref 0.3–1.2)
Total Protein: 7.1 g/dL (ref 6.5–8.1)

## 2017-08-25 LAB — CBC WITH DIFFERENTIAL/PLATELET
Basophils Absolute: 0 10*3/uL (ref 0.0–0.1)
Basophils Relative: 0 %
Eosinophils Absolute: 0 10*3/uL (ref 0.0–0.7)
Eosinophils Relative: 0 %
HCT: 38.1 % (ref 36.0–46.0)
Hemoglobin: 12.3 g/dL (ref 12.0–15.0)
Lymphocytes Relative: 11 %
Lymphs Abs: 0.8 10*3/uL (ref 0.7–4.0)
MCH: 31.9 pg (ref 26.0–34.0)
MCHC: 32.3 g/dL (ref 30.0–36.0)
MCV: 98.7 fL (ref 78.0–100.0)
Monocytes Absolute: 0.3 10*3/uL (ref 0.1–1.0)
Monocytes Relative: 4 %
Neutro Abs: 5.8 10*3/uL (ref 1.7–7.7)
Neutrophils Relative %: 85 %
Platelets: 198 10*3/uL (ref 150–400)
RBC: 3.86 MIL/uL — ABNORMAL LOW (ref 3.87–5.11)
RDW: 12.9 % (ref 11.5–15.5)
WBC: 6.9 10*3/uL (ref 4.0–10.5)

## 2017-08-25 LAB — MAGNESIUM: Magnesium: 2.1 mg/dL (ref 1.7–2.4)

## 2017-08-25 LAB — PHOSPHORUS: Phosphorus: 3.5 mg/dL (ref 2.5–4.6)

## 2017-08-25 MED ORDER — LACTULOSE 10 GM/15ML PO SOLN
30.0000 g | Freq: Three times a day (TID) | ORAL | Status: DC | PRN
  Administered 2017-08-25 – 2017-08-28 (×7): 30 g via ORAL
  Filled 2017-08-25 (×7): qty 45

## 2017-08-25 MED ORDER — CALCIUM CARBONATE 1250 (500 CA) MG PO TABS
3.0000 | ORAL_TABLET | Freq: Every day | ORAL | Status: DC
  Administered 2017-08-25 – 2017-08-29 (×5): 1500 mg via ORAL
  Filled 2017-08-25 (×4): qty 2

## 2017-08-25 MED ORDER — IPRATROPIUM-ALBUTEROL 0.5-2.5 (3) MG/3ML IN SOLN
3.0000 mL | RESPIRATORY_TRACT | Status: DC
  Administered 2017-08-25 – 2017-08-29 (×23): 3 mL via RESPIRATORY_TRACT
  Filled 2017-08-25 (×24): qty 3

## 2017-08-25 MED ORDER — GUAIFENESIN ER 600 MG PO TB12
1200.0000 mg | ORAL_TABLET | Freq: Two times a day (BID) | ORAL | Status: DC
  Administered 2017-08-25 – 2017-08-29 (×8): 1200 mg via ORAL
  Filled 2017-08-25 (×8): qty 2

## 2017-08-25 MED ORDER — METHYLPREDNISOLONE SODIUM SUCC 125 MG IJ SOLR
60.0000 mg | Freq: Two times a day (BID) | INTRAMUSCULAR | Status: DC
  Administered 2017-08-25 – 2017-08-27 (×4): 60 mg via INTRAVENOUS
  Filled 2017-08-25 (×5): qty 2

## 2017-08-25 MED ORDER — SODIUM CHLORIDE 0.9 % IV SOLN
INTRAVENOUS | Status: AC
  Administered 2017-08-25: 12:00:00 via INTRAVENOUS

## 2017-08-25 NOTE — Progress Notes (Signed)
Pt refusing to utilize continuous pulse oxymeter. She is annoyed of the beeping sound she says. RN requested for a new oxymeter and probe. Pt refuses to use. Will monitor pt.

## 2017-08-25 NOTE — Progress Notes (Signed)
PROGRESS NOTE    [name]  KPV:[id number] DOB: 11-26-1945 DOA: 08/24/2017 PCP: [name], MD   Brief Narrative:  [name] is a 71 y.o. female with medical history significant for COPD, ongoing tobacco abuse, chronic diastolic heart failure with severe pulmonary hypertension/mild aortic stenosis, anxiety disorder, hypertension, dyslipidemia, and chronic hypoxemia on nocturnal oxygen.  Patient was in her usual state of health when she developed increasing shortness of breath without cough fevers or chills on Sunday.  She has increased her O2 usage from at hour of sleep only to continuously during the day.  Room air saturations were 88%.  Patient with extensive wheezing and decreased airway movement despite IV steroids and nebulizer treatments.  Chest x-ray without edema or pneumonia.  Patient was admitted for COPD exacerbation.  Assessment & Plan:   Principal Problem:   COPD with acute exacerbation (HCC) Active Problems:   Chronic respiratory failure with hypoxia (HCC)   Chronic diastolic heart failure (HCC)   Anxiety   Coronary artery disease   HTN (hypertension)   Acute kidney injury (HCC)  COPD with acute exacerbation in the setting of Viral illness -Presents with shortness of breath times 4 days and increased O2 requirement (baseline is chronic HS O2) with clinical exam consistent with COPD exacerbation -Scheduled DuoNeb and prn Albuterol  -Budesonide neb -IV Solu-Medrol 60 mg IV every 12 hours -Supportive care with oxygen -Continue Singulair -Continues to smoke-defers nicotine patch when offered -Respiratory viral panel + -Also has underlying severe pulmonary hypertension (diagnosed on right heart catheterization 2014) -C/w Mucinex 1200 mg po BID -Repeat CXR in AM   Chronic respiratory failure with hypoxia  -3 L oxygen at hour of sleep now requiring continuous -Has viral etiology to COPD exacerbation and likely will have slower recovery time and may need  to discharge home on continuous oxygen with eventual transition back to at bedtime utilization -Serum CO2 32 so likely CO2 retainer so cautious up titration of oxygen  Chronic diastolic heart failure  -Clinically compensated and actually is somewhat dry (see below) -Hold home Lasix/Kdur -Not on ACE inhibitor or beta-blocker prior to admission -Daily weight, strict I/O -Echocardiogram May 2018: EF 60-65% with grade 1 diastolic dysfunction, mild aortic stenosis and normal RV systolic function -Started patient on gentle IVF at 50 mL/hr x 10 hours -Patient is + 1.455  HTN (hypertension) -Current blood pressure somewhat suboptimal in the 90 range -Not on antihypertensive agents prior to admission  Acute kidney injury  -Likely secondary to insensible fluid losses from acute COPD exacerbation -Baseline renal function: 8/0.83 -Admission renal function: 14/1.15 -Hold diureticand encourage oral fluids -Started Gentle IVF rehydration with NS at 50 mL/hr x 10 hours  -Repeat Labs improved   HLD -Continue omega-3 fatty acids and Livalo  Anxiety -Continue Alprazolam 1 mg po BIDprn  Coronary Artery Disease -Followed by Dr. Hilty -Low risk stress test November 2012 -Remote history of prior PCI -C/w ASA 325 mg po Daily and Pravastatin 40 mg po Daily   Chronic Constipation -C/w Home Lactulose 30 grams po TID prn  DVT prophylaxis: Enoxaparin 40 mg sq q24h Code Status: FULL CODE Family Communication: No family present at bedside Disposition Plan: Remain Inpatient for Treatment  Consultants:   None  Procedures:   None   Antimicrobials: Anti-infectives (From admission, onward)   Start     Dose/Rate Route Frequency Ordered Stop   08/24/17 1300  azithromycin (ZITHROMAX) tablet 500 mg     50 0 mg Oral Daily 08/24/17 1203  Subjective: Seen and examined and coughing up yellowish sputum. States she feels a little better. Still coughing and wheezing.   Objective: Vitals:     08/25/17 1407 08/25/17 1633 08/25/17 2105 08/25/17 2107  BP:      Pulse:      Resp:      Temp:      TempSrc:      SpO2: 93% 96% (!) 89% 98%  Weight:      Height:        Intake/Output Summary (Last 24 hours) at 08/25/2017 2129 Last data filed at 08/25/2017 1800 Gross per 24 hour  Intake 575 ml  Output -  Net 575 ml   Filed Weights   08/24/17 0502 08/25/17 0459  Weight: 55.3 kg (122 lb) 54.4 kg (120 lb)   Examination: Physical Exam:  Constitutional: WN/WD Caucasian female in NAD and appears calm and comfortable Eyes: Lids and conjunctivae normal, sclerae anicteric  ENMT: External Ears, Nose appear normal. Grossly normal hearing. Mucous membranes are moist.  Neck: Appears normal, supple, no cervical masses, normal ROM, no appreciable thyromegaly, no JVD Respiratory: Diminished to auscultation bilaterally with wheezing, and rhonchi. No crackles. Normal respiratory effort and patient is not tachypenic. No accessory muscle use but wearing supplemental O2 Cardiovascular: RRR, no murmurs / rubs / gallops. S1 and S2 auscultated. No extremity edema.  Abdomen: Soft, Mildly tender, non-distended. No masses palpated. No appreciable hepatosplenomegaly. Bowel sounds positive.  GU: Deferred. Musculoskeletal: No clubbing / cyanosis of digits/nails. No joint deformity upper and lower extremities.  Skin: No rashes, lesions, ulcers on a limited skin eval. No induration; Warm and dry.  Neurologic: CN 2-12 grossly intact with no focal deficits. Romberg sign and cerebellar reflexes not assessed.  Psychiatric: Normal judgment and insight. Alert and oriented x 3. Normal mood and appropriate affect.   Data Reviewed: I have personally reviewed following labs and imaging studies  CBC: Recent Labs  Lab 08/24/17 0450 08/25/17 0957  WBC 8.6 6.9  NEUTROABS  --  5.8  HGB 12.0 12.3  HCT 36.5 38.1  MCV 99.5 98.7  PLT 194 154   Basic Metabolic Panel: Recent Labs  Lab 08/24/17 0450  08/25/17 0957  NA 138 138  K 3.9 4.1  CL 98* 100*  CO2 32 31  GLUCOSE 112* 112*  BUN 14 12  CREATININE 1.15* 0.91  CALCIUM 9.4 10.0  MG 1.9 2.1  PHOS  --  3.5   GFR: Estimated Creatinine Clearance: 44.8 mL/min (by C-G formula based on SCr of 0.91 mg/dL). Liver Function Tests: Recent Labs  Lab 08/25/17 0957  AST 27  ALT 20  ALKPHOS 55  BILITOT 0.4  PROT 7.1  ALBUMIN 3.3*   No results for input(s): LIPASE, AMYLASE in the last 168 hours. No results for input(s): AMMONIA in the last 168 hours. Coagulation Profile: No results for input(s): INR, PROTIME in the last 168 hours. Cardiac Enzymes: No results for input(s): CKTOTAL, CKMB, CKMBINDEX, TROPONINI in the last 168 hours. BNP (last 3 results) No results for input(s): PROBNP in the last 8760 hours. HbA1C: No results for input(s): HGBA1C in the last 72 hours. CBG: No results for input(s): GLUCAP in the last 168 hours. Lipid Profile: No results for input(s): CHOL, HDL, LDLCALC, TRIG, CHOLHDL, LDLDIRECT in the last 72 hours. Thyroid Function Tests: No results for input(s): TSH, T4TOTAL, FREET4, T3FREE, THYROIDAB in the last 72 hours. Anemia Panel: No results for input(s): VITAMINB12, FOLATE, FERRITIN, TIBC, IRON, RETICCTPCT in the last 72 hours. Sepsis  Labs: No results for input(s): PROCALCITON, LATICACIDVEN in the last 168 hours.  Recent Results (from the past 240 hour(s))  Respiratory Panel by PCR     Status: Abnormal   Collection Time: 08/24/17 12:47 PM  Result Value Ref Range Status   Adenovirus NOT DETECTED NOT DETECTED Final   Coronavirus 229E NOT DETECTED NOT DETECTED Final   Coronavirus HKU1 NOT DETECTED NOT DETECTED Final   Coronavirus NL63 NOT DETECTED NOT DETECTED Final   Coronavirus OC43 NOT DETECTED NOT DETECTED Final   Metapneumovirus NOT DETECTED NOT DETECTED Final   Rhinovirus / Enterovirus DETECTED (A) NOT DETECTED Final   Influenza A NOT DETECTED NOT DETECTED Final   Influenza B NOT DETECTED NOT  DETECTED Final   Parainfluenza Virus 1 NOT DETECTED NOT DETECTED Final   Parainfluenza Virus 2 NOT DETECTED NOT DETECTED Final   Parainfluenza Virus 3 NOT DETECTED NOT DETECTED Final   Parainfluenza Virus 4 NOT DETECTED NOT DETECTED Final   Respiratory Syncytial Virus NOT DETECTED NOT DETECTED Final   Bordetella pertussis NOT DETECTED NOT DETECTED Final   Chlamydophila pneumoniae NOT DETECTED NOT DETECTED Final   Mycoplasma pneumoniae NOT DETECTED NOT DETECTED Final    Radiology Studies: Dg Chest 2 View  Result Date: 08/24/2017 CLINICAL DATA:  Shortness of breath for 3 days. EXAM: CHEST  2 VIEW COMPARISON:  Radiograph 02/24/2017, CT 01/01/2015 FINDINGS: Similar cardiomegaly. Aortic atherosclerosis. Pulmonary edema with minimal progression. Right middle lobe and lingular scarring, as seen on CT. No consolidation. Possible trace right pleural effusion. Right apical pleuroparenchymal scarring. No pneumothorax or acute osseous abnormality. IMPRESSION: Pulmonary edema, with mild increase from prior and possible small right pleural effusion. Stable cardiomegaly. Electronically Signed   By: Jeb Levering M.D.   On: 08/24/2017 05:18   Scheduled Meds: . aspirin  325 mg Oral q morning - 10a  . azithromycin  500 mg Oral Daily  . budesonide (PULMICORT) nebulizer solution  0.25 mg Nebulization BID  . calcium carbonate  3 tablet Oral Q breakfast  . cholecalciferol  1,000 Units Oral Daily  . enoxaparin (LOVENOX) injection  40 mg Subcutaneous Q24H  . guaiFENesin  1,200 mg Oral BID  . ipratropium-albuterol  3 mL Nebulization Q4H  . loratadine  10 mg Oral QPM  . methylPREDNISolone (SOLU-MEDROL) injection  60 mg Intravenous Q12H  . montelukast  10 mg Oral QHS  . nicotine  14 mg Transdermal Daily  . nortriptyline  50 mg Oral QHS  . omega-3 acid ethyl esters  1 g Oral Daily  . pantoprazole  40 mg Oral Daily  . pravastatin  40 mg Oral q1800  . sodium chloride flush  3 mL Intravenous Q12H  . vitamin C   500 mg Oral Daily   Continuous Infusions:   LOS: 1 day   Kerney Elbe, DO [facility] Pager [phone]  If 7PM-7AM, please contact night-coverage [email or web address] Password [facility] 08/25/2017, 9:29 PM

## 2017-08-25 NOTE — Progress Notes (Signed)
Patient refusing to allow bed alarms to be set.  Explained it is for her safety.  She wants it turned off.  Will continue to monitor.

## 2017-08-25 NOTE — Progress Notes (Addendum)
Pt. is refusing to allow bed alarms to be set.  Explained its advantage,and her safety.  She wants it turned off regardless.  Will continue to monitor

## 2017-08-25 NOTE — Progress Notes (Signed)
Pt instructed on flutter valve with teach back. Pt is able to perform appropriately on her own. Will continue to monitor respiratory status.

## 2017-08-26 ENCOUNTER — Inpatient Hospital Stay (HOSPITAL_COMMUNITY): Payer: Medicare Other

## 2017-08-26 DIAGNOSIS — R0602 Shortness of breath: Secondary | ICD-10-CM

## 2017-08-26 LAB — COMPREHENSIVE METABOLIC PANEL
ALT: 22 U/L (ref 14–54)
AST: 25 U/L (ref 15–41)
Albumin: 2.9 g/dL — ABNORMAL LOW (ref 3.5–5.0)
Alkaline Phosphatase: 50 U/L (ref 38–126)
Anion gap: 6 (ref 5–15)
BUN: 13 mg/dL (ref 6–20)
CO2: 32 mmol/L (ref 22–32)
Calcium: 9.7 mg/dL (ref 8.9–10.3)
Chloride: 101 mmol/L (ref 101–111)
Creatinine, Ser: 0.86 mg/dL (ref 0.44–1.00)
GFR calc Af Amer: >60 mL/min (ref >60)
GFR calc non Af Amer: >60 mL/min (ref >60)
Glucose, Bld: 124 mg/dL — ABNORMAL HIGH (ref 65–99)
Potassium: 4.3 mmol/L (ref 3.5–5.1)
Sodium: 139 mmol/L (ref 135–145)
Total Bilirubin: 0.3 mg/dL (ref 0.3–1.2)
Total Protein: 6.5 g/dL (ref 6.5–8.1)

## 2017-08-26 LAB — CBC WITH DIFFERENTIAL/PLATELET
Basophils Absolute: 0 10*3/uL (ref 0.0–0.1)
Basophils Relative: 0 %
Eosinophils Absolute: 0 10*3/uL (ref 0.0–0.7)
Eosinophils Relative: 0 %
HCT: 36.5 % (ref 36.0–46.0)
Hemoglobin: 11.6 g/dL — ABNORMAL LOW (ref 12.0–15.0)
Lymphocytes Relative: 7 %
Lymphs Abs: 0.5 10*3/uL — ABNORMAL LOW (ref 0.7–4.0)
MCH: 31.8 pg (ref 26.0–34.0)
MCHC: 31.8 g/dL (ref 30.0–36.0)
MCV: 100 fL (ref 78.0–100.0)
Monocytes Absolute: 0.2 10*3/uL (ref 0.1–1.0)
Monocytes Relative: 2 %
Neutro Abs: 6.6 10*3/uL (ref 1.7–7.7)
Neutrophils Relative %: 91 %
Platelets: 212 10*3/uL (ref 150–400)
RBC: 3.65 MIL/uL — ABNORMAL LOW (ref 3.87–5.11)
RDW: 12.7 % (ref 11.5–15.5)
WBC: 7.2 10*3/uL (ref 4.0–10.5)

## 2017-08-26 LAB — MAGNESIUM: Magnesium: 2 mg/dL (ref 1.7–2.4)

## 2017-08-26 LAB — PHOSPHORUS: Phosphorus: 3 mg/dL (ref 2.5–4.6)

## 2017-08-26 MED ORDER — SODIUM CHLORIDE 0.9 % IV SOLN
INTRAVENOUS | Status: AC
  Administered 2017-08-26: 14:00:00 via INTRAVENOUS

## 2017-08-26 MED ORDER — SENNOSIDES-DOCUSATE SODIUM 8.6-50 MG PO TABS
1.0000 | ORAL_TABLET | Freq: Two times a day (BID) | ORAL | Status: DC
  Administered 2017-08-26 – 2017-08-29 (×5): 1 via ORAL
  Filled 2017-08-26 (×7): qty 1

## 2017-08-26 NOTE — Plan of Care (Signed)
Pt. Is A& Health Behavior/Discharge Planning: Ability to manage health-related needs will improve 08/26/2017 0519 - Progressing by Renata Caprice, RN   Clinical Measurements: Will remain free from infection 08/26/2017 0519 - Progressing by Renata Caprice, RN   Clinical Measurements: Ability to maintain clinical measurements within normal limits will improve 08/26/2017 0519 - Progressing by Renata Caprice, RN Will remain free from infection 08/26/2017 0519 - Progressing by Renata Caprice, RN Diagnostic test results will improve 08/26/2017 0519 - Progressing by Renata Caprice, RN Respiratory complications will improve 08/26/2017 0519 - Progressing by Renata Caprice, RN Cardiovascular complication will be avoided 08/26/2017 0519 - Progressing by Renata Caprice, RN   Clinical Measurements: Diagnostic test results will improve 08/26/2017 0519 - Progressing by Renata Caprice, RN   Clinical Measurements: Respiratory complications will improve 08/26/2017 0519 - Progressing by Renata Caprice, RN   Education: Knowledge of General Education information will improve 08/26/2017 0519 - Progressing by Renata Caprice, RN

## 2017-08-26 NOTE — Care Management Note (Signed)
Case Management Note  Patient Details  Name: [name] MRN: [id number] Date of Birth: 1946/06/11  Subjective/Objective:                    Action/Plan:  Patient from home has home oxygen through [facility] and has portable tank family will bring at discharge. Expected Discharge Date:                  Expected Discharge Plan:  Home/Self Care  In-House Referral:     Discharge planning Services     Post Acute Care Choice:    Choice offered to:  Patient  DME Arranged:    DME Agency:     HH Arranged:    [place] Agency:     Status of Service:  In process, will continue to follow  If discussed at Long Length of Stay Meetings, dates discussed:    Additional Comments:  Pragya, Lofaso, RN 08/26/2017, 9:51 AM

## 2017-08-26 NOTE — Progress Notes (Signed)
PROGRESS NOTE    [name]  NIO:[id number] DOB: [birth year] DOA: 08/24/2017 PCP: [name], MD   Brief Narrative:  [name] is a 71 y.o. female with medical history significant for COPD, ongoing tobacco abuse, chronic diastolic heart failure with severe pulmonary hypertension/mild aortic stenosis, anxiety disorder, hypertension, dyslipidemia, and chronic hypoxemia on nocturnal oxygen. Patient was in her usual state of health when she developed increasing shortness of breath without cough fevers or chills on Sunday.  She has increased her O2 usage from at hour of sleep only to continuously during the day.  Room air saturations were 88%.  Patient with extensive wheezing and decreased airway movement despite IV steroids and nebulizer treatments.  Chest x-ray without edema or pneumonia.  Patient was admitted for COPD exacerbation and now complaining of constipation.   Assessment & Plan:   Principal Problem:   COPD with acute exacerbation ([place]) Active Problems:   Chronic respiratory failure with hypoxia (HCC)   Chronic diastolic heart failure (HCC)   Anxiety   Coronary artery disease   HTN (hypertension)   Acute kidney injury ([place])  COPD with acute exacerbation in the setting of Viral Rhinovirus -Presents with shortness of breath times 4 days and increased O2 requirement (baseline is chronic HS O2) with clinical exam consistent with COPD exacerbation -C/w Scheduled DuoNeb and prn Albuterol  -Budesonide neb -IV Solu-Medrol 60 mg IV every 12 hours -Supportive care with oxygen -Continue Singulair -Continues to smoke-defers nicotine patch when offered -Respiratory viral panel + for Rhinovirus/Enterovirus -Also has underlying severe pulmonary hypertension (diagnosed on right heart catheterization 2014) -C/w Mucinex 1200 mg po BID, Flutter Valve, Incentive Spirometry  -Repeat CXR this AM showed Chronic interstitial prominence compatible with chronic interstitial lung disease.  Prominence of the hila bilaterally, favor vascular cannot completely exclude adenopathy. Bibasilar scarring. -Will Consider CT of Chest if not improving   Chronic respiratory failure with Hypoxia  -3 L oxygen at hour of sleep now requiring continuous -Has viral etiology to COPD exacerbation and likely will have slower recovery time and may need to discharge home on continuous oxygen with eventual transition back to at bedtime utilization -Serum CO2 32 so likely CO2 retainer so cautious up titration of oxygen  Chronic Diastolic Heart Failure  -Clinically compensated and actually is somewhat dry (see below) -Hold home Lasix/Kdur for now and continue IVF with NS at 50 mL/hr x 20 hours -Not on ACE inhibitor or beta-blocker prior to admission -Daily weight, strict I/O -Echocardiogram May 2018: EF 60-65% with grade 1 diastolic dysfunction, mild aortic stenosis and normal RV systolic function -Started patient on gentle IVF at 50 mL/hr x 10 hours yesterday and will restart IVF with NS at 50 mL/hr x 20 hours patient continued to appear dry -Patient is + 1.695 Liters and is +7 lbs but ? Accuracy   HTN (hypertension) -Current blood pressure somewhat suboptimal in the 90 range -Not on antihypertensive agents prior to admission  Acute kidney injury  -Likely secondary to insensible fluid losses from acute COPD exacerbation -Today's Renal function: 13/0.86 -Admission renal function: 14/1.15 -Hold diureticand encourage oral fluids -Started Gentle IVF rehydration with NS at 50 mL/hr x 10 hours yesterday and will restart NS at 50 mL/hr x 20 hours  -Repeat Labs improved   HLD -Continue Omega-3 fatty acids and Livalo  Anxiety -Continue Alprazolam 1 mg po BIDprn  Coronary Artery Disease -Followed by Dr. Debara Pickett -Low risk stress test November 2012 -Remote history of prior PCI -C/w ASA 325  mg po Daily and Pravastatin 40 mg po Daily   Chronic Constipation -C/w Home Lactulose 30 grams po TID  prn -Added Senna-Docusate 1 tab po BID -C/w IVF with NS at 50 mL/hr x 20 hours   DVT prophylaxis: Enoxaparin 40 mg sq q24h Code Status: FULL CODE Family Communication: No family present at bedside Disposition Plan: Remain Inpatient for Treatment  Consultants:   None  Procedures:   None   Antimicrobials: Anti-infectives (From admission, onward)   Start     Dose/Rate Route Frequency Ordered Stop   08/24/17 1300  azithromycin (ZITHROMAX) tablet 500 mg     500 mg Oral Daily 08/24/17 1203       Subjective: Seen and examined and felt slightly better breathing wise but states she hasn't had a proper bowel movement since Monday. States she got anxious when going to the bathroom. No lightheadedness or dizziness. No other concerns or complaints at this time.   Objective: Vitals:   08/26/17 1206 08/26/17 1232 08/26/17 1309 08/26/17 1605  BP:   131/64   Pulse:   87   Resp:   19   Temp:   98.3 F (36.8 C)   TempSrc:   Oral   SpO2: 98%  91% 94%  Weight:  57.9 kg (127 lb 10.3 oz)    Height:        Intake/Output Summary (Last 24 hours) at 08/26/2017 1706 Last data filed at 08/26/2017 0553 Gross per 24 hour  Intake 565 ml  Output -  Net 565 ml   Filed Weights   08/24/17 0502 08/25/17 0459 08/26/17 1232  Weight: 55.3 kg (122 lb) 54.4 kg (120 lb) 57.9 kg (127 lb 10.3 oz)   Examination: Physical Exam:  Constitutional: Caucasian female in NAD appears calm  Eyes: Sclerae anicteric. Lids normal ENMT: External Ears and nose appear normal. MMM Neck: Supple with no JVD Respiratory: Diminished with some wheezing. No appreciable rales or crackles. Normal respiratory effort. No accessory muscle usage but is wearing supplemental O2 via [place] Cardiovascular: RRR; No appreciable LE Edema Abdomen: Soft, slightly tender. ND. Bowel sounds present. Has abdominal scaring from previous surgery  GU: Deferred Musculoskeletal: No contractures. No cyanosis Skin: No rashes or lesions on a limited  skin eval. Warm and dry Neurologic: CN 2-12 grossly intact. No appreciable focal deficits Psychiatric: Normal mood and affect. Intact judgement and insight  Data Reviewed: I have personally reviewed following labs and imaging studies  CBC: Recent Labs  Lab 08/24/17 0450 08/25/17 0957 08/26/17 0411  WBC 8.6 6.9 7.2  NEUTROABS  --  5.8 6.6  HGB 12.0 12.3 11.6*  HCT 36.5 38.1 36.5  MCV 99.5 98.7 100.0  PLT 194 198 712   Basic Metabolic Panel: Recent Labs  Lab 08/24/17 0450 08/25/17 0957 08/26/17 0411  NA 138 138 139  K 3.9 4.1 4.3  CL 98* 100* 101  CO2 32 31 32  GLUCOSE 112* 112* 124*  BUN 14 12 13   CREATININE 1.15* 0.91 0.86  CALCIUM 9.4 10.0 9.7  MG 1.9 2.1 2.0  PHOS  --  3.5 3.0   GFR: Estimated Creatinine Clearance: 47.5 mL/min (by C-G formula based on SCr of 0.86 mg/dL). Liver Function Tests: Recent Labs  Lab 08/25/17 0957 08/26/17 0411  AST 27 25  ALT 20 22  ALKPHOS 55 50  BILITOT 0.4 0.3  PROT 7.1 6.5  ALBUMIN 3.3* 2.9*   No results for input(s): LIPASE, AMYLASE in the last 168 hours. No results for input(s): AMMONIA  in the last 168 hours. Coagulation Profile: No results for input(s): INR, PROTIME in the last 168 hours. Cardiac Enzymes: No results for input(s): CKTOTAL, CKMB, CKMBINDEX, TROPONINI in the last 168 hours. BNP (last 3 results) No results for input(s): PROBNP in the last 8760 hours. HbA1C: No results for input(s): HGBA1C in the last 72 hours. CBG: No results for input(s): GLUCAP in the last 168 hours. Lipid Profile: No results for input(s): CHOL, HDL, LDLCALC, TRIG, CHOLHDL, LDLDIRECT in the last 72 hours. Thyroid Function Tests: No results for input(s): TSH, T4TOTAL, FREET4, T3FREE, THYROIDAB in the last 72 hours. Anemia Panel: No results for input(s): VITAMINB12, FOLATE, FERRITIN, TIBC, IRON, RETICCTPCT in the last 72 hours. Sepsis Labs: No results for input(s): PROCALCITON, LATICACIDVEN in the last 168 hours.  Recent Results  (from the past 240 hour(s))  Respiratory Panel by PCR     Status: Abnormal   Collection Time: 08/24/17 12:47 PM  Result Value Ref Range Status   Adenovirus NOT DETECTED NOT DETECTED Final   Coronavirus 229E NOT DETECTED NOT DETECTED Final   Coronavirus HKU1 NOT DETECTED NOT DETECTED Final   Coronavirus NL63 NOT DETECTED NOT DETECTED Final   Coronavirus OC43 NOT DETECTED NOT DETECTED Final   Metapneumovirus NOT DETECTED NOT DETECTED Final   Rhinovirus / Enterovirus DETECTED (A) NOT DETECTED Final   Influenza A NOT DETECTED NOT DETECTED Final   Influenza B NOT DETECTED NOT DETECTED Final   Parainfluenza Virus 1 NOT DETECTED NOT DETECTED Final   Parainfluenza Virus 2 NOT DETECTED NOT DETECTED Final   Parainfluenza Virus 3 NOT DETECTED NOT DETECTED Final   Parainfluenza Virus 4 NOT DETECTED NOT DETECTED Final   Respiratory Syncytial Virus NOT DETECTED NOT DETECTED Final   Bordetella pertussis NOT DETECTED NOT DETECTED Final   Chlamydophila pneumoniae NOT DETECTED NOT DETECTED Final   Mycoplasma pneumoniae NOT DETECTED NOT DETECTED Final    Radiology Studies: Dg Chest Port 1 View  Result Date: 08/26/2017 CLINICAL DATA:  Shortness of breath, cough EXAM: PORTABLE CHEST 1 VIEW COMPARISON:  08/24/2017 FINDINGS: Cardiomegaly. Prominence of the hila bilaterally, likely vascular although adenopathy cannot be excluded. Diffuse interstitial prominence in the lungs. Bibasilar densities, likely scarring. No effusions or acute bony abnormality. IMPRESSION: Chronic interstitial prominence compatible with chronic interstitial lung disease. Prominence of the hila bilaterally, favor vascular cannot completely exclude adenopathy. If further evaluation is felt warranted, chest CT with IV contrast may be helpful. Bibasilar scarring. Electronically Signed   By: Rolm Baptise M.D.   On: 08/26/2017 09:43   Scheduled Meds: . aspirin  325 mg Oral q morning - 10a  . azithromycin  500 mg Oral Daily  . budesonide  (PULMICORT) nebulizer solution  0.25 mg Nebulization BID  . calcium carbonate  3 tablet Oral Q breakfast  . cholecalciferol  1,000 Units Oral Daily  . enoxaparin (LOVENOX) injection  40 mg Subcutaneous Q24H  . guaiFENesin  1,200 mg Oral BID  . ipratropium-albuterol  3 mL Nebulization Q4H  . loratadine  10 mg Oral QPM  . methylPREDNISolone (SOLU-MEDROL) injection  60 mg Intravenous Q12H  . montelukast  10 mg Oral QHS  . nicotine  14 mg Transdermal Daily  . nortriptyline  50 mg Oral QHS  . omega-3 acid ethyl esters  1 g Oral Daily  . pantoprazole  40 mg Oral Daily  . pravastatin  40 mg Oral q1800  . senna-docusate  1 tablet Oral BID  . sodium chloride flush  3 mL Intravenous Q12H  .  vitamin C  500 mg Oral Daily   Continuous Infusions: . sodium chloride 50 mL/hr at 08/26/17 1406     LOS: 2 days   Kerney Elbe, DO [facility] Pager [phone]  If 7PM-7AM, please contact night-coverage [email or web address] Password [email or web address] 08/26/2017, 5:06 PM

## 2017-08-27 LAB — COMPREHENSIVE METABOLIC PANEL
ALT: 23 U/L (ref 14–54)
AST: 27 U/L (ref 15–41)
Albumin: 2.7 g/dL — ABNORMAL LOW (ref 3.5–5.0)
Alkaline Phosphatase: 45 U/L (ref 38–126)
Anion gap: 8 (ref 5–15)
BUN: 12 mg/dL (ref 6–20)
CO2: 27 mmol/L (ref 22–32)
Calcium: 9.7 mg/dL (ref 8.9–10.3)
Chloride: 104 mmol/L (ref 101–111)
Creatinine, Ser: 0.77 mg/dL (ref 0.44–1.00)
GFR calc Af Amer: >60 mL/min (ref >60)
GFR calc non Af Amer: >60 mL/min (ref >60)
Glucose, Bld: 102 mg/dL — ABNORMAL HIGH (ref 65–99)
Potassium: 3.9 mmol/L (ref 3.5–5.1)
Sodium: 139 mmol/L (ref 135–145)
Total Bilirubin: 0.3 mg/dL (ref 0.3–1.2)
Total Protein: 6.4 g/dL — ABNORMAL LOW (ref 6.5–8.1)

## 2017-08-27 LAB — CBC WITH DIFFERENTIAL/PLATELET
Basophils Absolute: 0 10*3/uL (ref 0.0–0.1)
Basophils Relative: 0 %
Eosinophils Absolute: 0 10*3/uL (ref 0.0–0.7)
Eosinophils Relative: 0 %
HCT: 32.4 % — ABNORMAL LOW (ref 36.0–46.0)
Hemoglobin: 10.4 g/dL — ABNORMAL LOW (ref 12.0–15.0)
Lymphocytes Relative: 11 %
Lymphs Abs: 0.8 10*3/uL (ref 0.7–4.0)
MCH: 31.8 pg (ref 26.0–34.0)
MCHC: 32.1 g/dL (ref 30.0–36.0)
MCV: 99.1 fL (ref 78.0–100.0)
Monocytes Absolute: 0.3 10*3/uL (ref 0.1–1.0)
Monocytes Relative: 5 %
Neutro Abs: 5.7 10*3/uL (ref 1.7–7.7)
Neutrophils Relative %: 84 %
Platelets: 177 10*3/uL (ref 150–400)
RBC: 3.27 MIL/uL — ABNORMAL LOW (ref 3.87–5.11)
RDW: 12.6 % (ref 11.5–15.5)
WBC: 6.8 10*3/uL (ref 4.0–10.5)

## 2017-08-27 LAB — PHOSPHORUS: Phosphorus: 2.5 mg/dL (ref 2.5–4.6)

## 2017-08-27 LAB — MAGNESIUM: Magnesium: 1.8 mg/dL (ref 1.7–2.4)

## 2017-08-27 MED ORDER — FLEET ENEMA 7-19 GM/118ML RE ENEM
1.0000 | ENEMA | Freq: Once | RECTAL | Status: AC
  Administered 2017-08-27: 1 via RECTAL
  Filled 2017-08-27: qty 1

## 2017-08-27 NOTE — Progress Notes (Signed)
PROGRESS NOTE    [name]  UUV:[id number] DOB: Aug 05, 1946 DOA: 08/24/2017 PCP: [name], MD   Brief Narrative:  [name] is a 71 y.o. female with medical history significant for COPD, ongoing tobacco abuse, chronic diastolic heart failure with severe pulmonary hypertension/mild aortic stenosis, anxiety disorder, hypertension, dyslipidemia, and chronic hypoxemia on nocturnal oxygen. Patient was in her usual state of health when she developed increasing shortness of breath without cough fevers or chills on Sunday.  She has increased her O2 usage from at hour of sleep only to continuously during the day.  Room air saturations were 88%.  Patient with extensive wheezing and decreased airway movement despite IV steroids and nebulizer treatments.  Chest x-ray without edema or pneumonia.  Patient was admitted for COPD exacerbation and now complaining of constipation.   08/27/17 - Patient seen. Still not back to baseline. No bowel movement in over 5 days. Patient has tried laxatives without any result. For enema. Will continue treatment for pulmonary symptoms.  Assessment & Plan:   Principal Problem:   COPD with acute exacerbation ([place]) Active Problems:   Chronic respiratory failure with hypoxia (H[facility])   Chronic diastolic heart failure (HCC)   Anxiety   Coronary artery disease   HTN (hypertension)   Acute kidney injury ([place])  COPD with acute exacerbation in the setting of Viral Rhinovirus -Presents with shortness of breath times 4 days and increased O2 requirement (baseline is chronic HS O2) with clinical exam consistent with COPD exacerbation -C/w Scheduled DuoNeb and prn Albuterol  -Budesonide neb -IV Solu-Medrol 60 mg IV every 12 hours -Supportive care with oxygen -Continue Singulair -Continues to smoke-defers nicotine patch when offered -Respiratory viral panel + for Rhinovirus/Enterovirus -Also has underlying severe pulmonary hypertension (diagnosed on right heart  catheterization 2014) -C/w Mucinex 1200 mg po BID, Flutter Valve, Incentive Spirometry  -Repeat CXR this AM showed Chronic interstitial prominence compatible with chronic interstitial lung disease. Prominence of the hila bilaterally, favor vascular cannot completely exclude adenopathy. Bibasilar scarring. -Will Consider CT of Chest if not improving   Chronic respiratory failure with Hypoxia  -3 L oxygen at hour of sleep now requiring continuous -Has viral etiology to COPD exacerbation and likely will have slower recovery time and may need to discharge home on continuous oxygen with eventual transition back to at bedtime utilization -Serum CO2 32 so likely CO2 retainer so cautious up titration of oxygen  Chronic Diastolic Heart Failure  -Clinically compensated and actually is somewhat dry (see below) -Hold home Lasix/Kdur for now and continue IVF with NS at 50 mL/hr x 20 hours -Not on ACE inhibitor or beta-blocker prior to admission -Daily weight, strict I/O -Echocardiogram May 2018: EF 60-65% with grade 1 diastolic dysfunction, mild aortic stenosis and normal RV systolic function -Started patient on gentle IVF at 50 mL/hr x 10 hours yesterday and will restart IVF with NS at 50 mL/hr x 20 hours patient continued to appear dry -Patient is + 1.695 Liters and is +7 lbs but ? Accuracy   HTN (hypertension) -Current blood pressure somewhat suboptimal in the 90 range -Not on antihypertensive agents prior to admission  Acute kidney injury  -Likely secondary to insensible fluid losses from acute COPD exacerbation -Today's Renal function: 13/0.86 -Admission renal function: 14/1.15 -Hold diureticand encourage oral fluids -Started Gentle IVF rehydration with NS at 50 mL/hr x 10 hours yesterday and will restart NS at 50 mL/hr x 20 hours  -Repeat Labs improved   HLD -Continue Omega-3 fatty acids  and Livalo  Anxiety -Continue Alprazolam 1 mg po BIDprn  Coronary Artery Disease -Followed  by Dr. Debara Pickett -Low risk stress test November 2012 -Remote history of prior PCI -C/w ASA 325 mg po Daily and Pravastatin 40 mg po Daily   Chronic Constipation -C/w Home Lactulose 30 grams po TID prn -Added Senna-Docusate 1 tab po BID -C/w IVF with NS at 50 mL/hr x 20 hours   DVT prophylaxis: Enoxaparin 40 mg sq q24h Code Status: FULL CODE Family Communication: No family present at bedside Disposition Plan: Remain Inpatient for Treatment  Consultants:   None  Procedures:   None   Antimicrobials: Anti-infectives (From admission, onward)   Start     Dose/Rate Route Frequency Ordered Stop   08/24/17 1300  azithromycin (ZITHROMAX) tablet 500 mg     500 mg Oral Daily 08/24/17 1203       Subjective: Seen and examined and felt slightly better breathing wise but states she hasn't had a proper bowel movement since Monday. States she got anxious when going to the bathroom. No lightheadedness or dizziness. No other concerns or complaints at this time.   Objective: Vitals:   08/27/17 0514 08/27/17 0900 08/27/17 0921 08/27/17 1236  BP: 134/75     Pulse: 84     Resp: 18     Temp: 98.5 F (36.9 C)     TempSrc:      SpO2: 95% 96% 97% 99%  Weight:      Height:        Intake/Output Summary (Last 24 hours) at 08/27/2017 1255 Last data filed at 08/27/2017 0300 Gross per 24 hour  Intake 200 ml  Output -  Net 200 ml   Filed Weights   08/24/17 0502 08/25/17 0459 08/26/17 1232  Weight: 55.3 kg (122 lb) 54.4 kg (120 lb) 57.9 kg (127 lb 10.3 oz)   Examination: Physical Exam:  Constitutional: Caucasian female in NAD appears calm  Eyes: Sclerae anicteric. Lids normal ENMT: External Ears and nose appear normal. MMM Neck: Supple with no JVD Respiratory: Diminished with some wheezing. No appreciable rales or crackles. Normal respiratory effort. No accessory muscle usage but is wearing supplemental O2 via [place] Cardiovascular: RRR; No appreciable LE Edema Abdomen: Soft, slightly tender.  ND. Bowel sounds present. Has abdominal scaring from previous surgery  GU: Deferred Musculoskeletal: No contractures. No cyanosis Skin: No rashes or lesions on a limited skin eval. Warm and dry Neurologic: CN 2-12 grossly intact. No appreciable focal deficits Psychiatric: Normal mood and affect. Intact judgement and insight  Data Reviewed: I have personally reviewed following labs and imaging studies  CBC: Recent Labs  Lab 08/24/17 0450 08/25/17 0957 08/26/17 0411 08/27/17 0502  WBC 8.6 6.9 7.2 6.8  NEUTROABS  --  5.8 6.6 5.7  HGB 12.0 12.3 11.6* 10.4*  HCT 36.5 38.1 36.5 32.4*  MCV 99.5 98.7 100.0 99.1  PLT 194 198 212 725   Basic Metabolic Panel: Recent Labs  Lab 08/24/17 0450 08/25/17 0957 08/26/17 0411 08/27/17 0502  NA 138 138 139 139  K 3.9 4.1 4.3 3.9  CL 98* 100* 101 104  CO2 32 31 32 27  GLUCOSE 112* 112* 124* 102*  BUN 14 12 13 12   CREATININE 1.15* 0.91 0.86 0.77  CALCIUM 9.4 10.0 9.7 9.7  MG 1.9 2.1 2.0 1.8  PHOS  --  3.5 3.0 2.5   GFR: Estimated Creatinine Clearance: 51 mL/min (by C-G formula based on SCr of 0.77 mg/dL). Liver Function Tests: Recent Labs  Lab 08/25/17 0957 08/26/17 0411 08/27/17 0502  AST 27 25 27   ALT 20 22 23   ALKPHOS 55 50 45  BILITOT 0.4 0.3 0.3  PROT 7.1 6.5 6.4*  ALBUMIN 3.3* 2.9* 2.7*   No results for input(s): LIPASE, AMYLASE in the last 168 hours. No results for input(s): AMMONIA in the last 168 hours. Coagulation Profile: No results for input(s): INR, PROTIME in the last 168 hours. Cardiac Enzymes: No results for input(s): CKTOTAL, CKMB, CKMBINDEX, TROPONINI in the last 168 hours. BNP (last 3 results) No results for input(s): PROBNP in the last 8760 hours. HbA1C: No results for input(s): HGBA1C in the last 72 hours. CBG: No results for input(s): GLUCAP in the last 168 hours. Lipid Profile: No results for input(s): CHOL, HDL, LDLCALC, TRIG, CHOLHDL, LDLDIRECT in the last 72 hours. Thyroid Function Tests: No  results for input(s): TSH, T4TOTAL, FREET4, T3FREE, THYROIDAB in the last 72 hours. Anemia Panel: No results for input(s): VITAMINB12, FOLATE, FERRITIN, TIBC, IRON, RETICCTPCT in the last 72 hours. Sepsis Labs: No results for input(s): PROCALCITON, LATICACIDVEN in the last 168 hours.  Recent Results (from the past 240 hour(s))  Respiratory Panel by PCR     Status: Abnormal   Collection Time: 08/24/17 12:47 PM  Result Value Ref Range Status   Adenovirus NOT DETECTED NOT DETECTED Final   Coronavirus 229E NOT DETECTED NOT DETECTED Final   Coronavirus HKU1 NOT DETECTED NOT DETECTED Final   Coronavirus NL63 NOT DETECTED NOT DETECTED Final   Coronavirus OC43 NOT DETECTED NOT DETECTED Final   Metapneumovirus NOT DETECTED NOT DETECTED Final   Rhinovirus / Enterovirus DETECTED (A) NOT DETECTED Final   Influenza A NOT DETECTED NOT DETECTED Final   Influenza B NOT DETECTED NOT DETECTED Final   Parainfluenza Virus 1 NOT DETECTED NOT DETECTED Final   Parainfluenza Virus 2 NOT DETECTED NOT DETECTED Final   Parainfluenza Virus 3 NOT DETECTED NOT DETECTED Final   Parainfluenza Virus 4 NOT DETECTED NOT DETECTED Final   Respiratory Syncytial Virus NOT DETECTED NOT DETECTED Final   Bordetella pertussis NOT DETECTED NOT DETECTED Final   Chlamydophila pneumoniae NOT DETECTED NOT DETECTED Final   Mycoplasma pneumoniae NOT DETECTED NOT DETECTED Final    Radiology Studies: Dg Chest Port 1 View  Result Date: 08/26/2017 CLINICAL DATA:  Shortness of breath, cough EXAM: PORTABLE CHEST 1 VIEW COMPARISON:  08/24/2017 FINDINGS: Cardiomegaly. Prominence of the hila bilaterally, likely vascular although adenopathy cannot be excluded. Diffuse interstitial prominence in the lungs. Bibasilar densities, likely scarring. No effusions or acute bony abnormality. IMPRESSION: Chronic interstitial prominence compatible with chronic interstitial lung disease. Prominence of the hila bilaterally, favor vascular cannot completely  exclude adenopathy. If further evaluation is felt warranted, chest CT with IV contrast may be helpful. Bibasilar scarring. Electronically Signed   By: Rolm Baptise M.D.   On: 08/26/2017 09:43   Scheduled Meds: . aspirin  325 mg Oral q morning - 10a  . azithromycin  500 mg Oral Daily  . budesonide (PULMICORT) nebulizer solution  0.25 mg Nebulization BID  . calcium carbonate  3 tablet Oral Q breakfast  . cholecalciferol  1,000 Units Oral Daily  . enoxaparin (LOVENOX) injection  40 mg Subcutaneous Q24H  . guaiFENesin  1,200 mg Oral BID  . ipratropium-albuterol  3 mL Nebulization Q4H  . loratadine  10 mg Oral QPM  . methylPREDNISolone (SOLU-MEDROL) injection  60 mg Intravenous Q12H  . montelukast  10 mg Oral QHS  . nicotine  14 mg Transdermal Daily  .  nortriptyline  50 mg Oral QHS  . omega-3 acid ethyl esters  1 g Oral Daily  . pantoprazole  40 mg Oral Daily  . pravastatin  40 mg Oral q1800  . senna-docusate  1 tablet Oral BID  . sodium chloride flush  3 mL Intravenous Q12H  . vitamin C  500 mg Oral Daily   Continuous Infusions:    LOS: 3 days   Bonnell Public, MD [facility] Pager [phone]  If 7PM-7AM, please contact night-coverage [email or web address] Password [email or web address] 08/27/2017, 12:55 PM

## 2017-08-28 MED ORDER — METHYLPREDNISOLONE SODIUM SUCC 125 MG IJ SOLR: 60.0000 mg | Freq: Two times a day (BID) | INTRAMUSCULAR | Status: DC

## 2017-08-28 MED ORDER — PREDNISONE 50 MG PO TABS
60.0000 mg | ORAL_TABLET | Freq: Every day | ORAL | Status: DC
  Administered 2017-08-28 – 2017-08-29 (×2): 60 mg via ORAL
  Filled 2017-08-28 (×2): qty 1

## 2017-08-28 MED ORDER — PREDNISONE 50 MG PO TABS: 75.0000 mg | ORAL_TABLET | Freq: Every day | ORAL | Status: DC

## 2017-08-28 NOTE — Progress Notes (Signed)
PROGRESS NOTE    [name]  KGY:[id number] DOB: [birth year] DOA: 08/24/2017 PCP: [name], MD   Brief Narrative:  [name] is a 71 y.o. female with medical history significant for COPD, ongoing tobacco abuse, chronic diastolic heart failure with severe pulmonary hypertension/mild aortic stenosis, anxiety disorder, hypertension, dyslipidemia, and chronic hypoxemia on nocturnal oxygen. Patient was in her usual state of health when she developed increasing shortness of breath without cough fevers or chills on Sunday.  She has increased her O2 usage from at hour of sleep only to continuously during the day.  Room air saturations were 88%.  Patient with extensive wheezing and decreased airway movement despite IV steroids and nebulizer treatments.  Chest x-ray without edema or pneumonia.  Patient was admitted for COPD exacerbation and now complaining of constipation.   08/28/17 - Patient seen. Still not back to baseline. Patient insists on the steroid being changed to oral. Patient responded to enema (Patient did not have bowel movement for about 5 days. Patient has tried laxatives without any result). Will continue treatment for pulmonary symptoms.  Assessment & Plan:   Principal Problem:   COPD with acute exacerbation ([place]) Active Problems:   Chronic respiratory failure with hypoxia (HCC)   Chronic diastolic heart failure ([facility])   Anxiety   Coronary artery disease   HTN (hypertension)   Acute kidney injury ([place])  COPD with acute exacerbation in the setting of Viral Rhinovirus -Presents with shortness of breath times 4 days and increased O2 requirement (baseline is chronic HS O2) with clinical exam consistent with COPD exacerbation -C/w Scheduled DuoNeb and prn Albuterol  -Budesonide neb - Change IV Solu-Medrol 60 mg IV every 12 hours to Prednisone 60mg  po once daily. -Supportive care with oxygen -Continue Singulair -Continues to smoke-defers nicotine patch when  offered -Respiratory viral panel + for Rhinovirus/Enterovirus -Also has underlying severe pulmonary hypertension (diagnosed on right heart catheterization 2014) -C/w Mucinex 1200 mg po BID, Flutter Valve, Incentive Spirometry  -Repeat CXR this AM showed Chronic interstitial prominence compatible with chronic interstitial lung disease. Prominence of the hila bilaterally, favor vascular cannot completely exclude adenopathy. Bibasilar scarring. -Will Consider CT of Chest if not improving   Chronic respiratory failure with Hypoxia  -3 L oxygen at hour of sleep now requiring continuous -Has viral etiology to COPD exacerbation and likely will have slower recovery time and may need to discharge home on continuous oxygen with eventual transition back to at bedtime utilization -Serum CO2 32 so likely CO2 retainer so cautious up titration of oxygen  Chronic Diastolic Heart Failure  -Clinically compensated and actually is somewhat dry (see below) -Hold home Lasix/Kdur for now and continue IVF with NS at 50 mL/hr x 20 hours -Not on ACE inhibitor or beta-blocker prior to admission -Daily weight, strict I/O -Echocardiogram May 2018: EF 60-65% with grade 1 diastolic dysfunction, mild aortic stenosis and normal RV systolic function -Started patient on gentle IVF at 50 mL/hr x 10 hours yesterday and will restart IVF with NS at 50 mL/hr x 20 hours patient continued to appear dry -Patient is + 1.695 Liters and is +7 lbs but ? Accuracy   HTN (hypertension) -Current blood pressure somewhat suboptimal in the 90 range -Not on antihypertensive agents prior to admission  Acute kidney injury  -Likely secondary to insensible fluid losses from acute COPD exacerbation -Today's Renal function: 13/0.86 -Admission renal function: 14/1.15 -Hold diureticand encourage oral fluids -Started Gentle IVF rehydration with NS at 50 mL/hr x 10 hours  yesterday and will restart NS at 50 mL/hr x 20 hours  -Repeat Labs improved    HLD -Continue Omega-3 fatty acids and Livalo  Anxiety -Continue Alprazolam 1 mg po BIDprn  Coronary Artery Disease -Followed by Dr. Debara Pickett -Low risk stress test November 2012 -Remote history of prior PCI -C/w ASA 325 mg po Daily and Pravastatin 40 mg po Daily   Chronic Constipation -C/w Home Lactulose 30 grams po TID prn -Added Senna-Docusate 1 tab po BID -C/w IVF with NS at 50 mL/hr x 20 hours   DVT prophylaxis: Enoxaparin 40 mg sq q24h Code Status: FULL CODE Family Communication: No family present at bedside Disposition Plan: Remain Inpatient for Treatment  Consultants:   None  Procedures:   None   Antimicrobials: Anti-infectives (From admission, onward)   Start     Dose/Rate Route Frequency Ordered Stop   08/24/17 1300  azithromycin (ZITHROMAX) tablet 500 mg     500 mg Oral Daily 08/24/17 1203       Subjective: Seen and examined and felt slightly better breathing wise but states she hasn't had a proper bowel movement since Monday. States she got anxious when going to the bathroom. No lightheadedness or dizziness. No other concerns or complaints at this time.   Objective: Vitals:   08/28/17 0446 08/28/17 0838 08/28/17 0842 08/28/17 1225  BP: 109/63     Pulse: 85     Resp: 15     Temp: 98.3 F (36.8 C)     TempSrc: Oral     SpO2: 99% 97% 100% 100%  Weight:      Height:        Intake/Output Summary (Last 24 hours) at 08/28/2017 1342 Last data filed at 08/27/2017 1600 Gross per 24 hour  Intake -  Output 1 ml  Net -1 ml   Filed Weights   08/25/17 0459 08/26/17 1232 08/27/17 1700  Weight: 54.4 kg (120 lb) 57.9 kg (127 lb 10.3 oz) 56.5 kg (124 lb 9 oz)   Examination: Physical Exam:  Constitutional: Caucasian female in NAD appears calm  Eyes: Sclerae anicteric. Lids normal ENMT: External Ears and nose appear normal. MMM Neck: Supple with no JVD Respiratory: Diminished with some wheezing. No appreciable rales or crackles. Normal respiratory  effort. No accessory muscle usage but is wearing supplemental O2 via [place] Cardiovascular: RRR; No appreciable LE Edema Abdomen: Soft, slightly tender. ND. Bowel sounds present. Has abdominal scaring from previous surgery  GU: Deferred Musculoskeletal: No contractures. No cyanosis Skin: No rashes or lesions on a limited skin eval. Warm and dry Neurologic: CN 2-12 grossly intact. No appreciable focal deficits Psychiatric: Normal mood and affect. Intact judgement and insight  Data Reviewed: I have personally reviewed following labs and imaging studies  CBC: Recent Labs  Lab 08/24/17 0450 08/25/17 0957 08/26/17 0411 08/27/17 0502  WBC 8.6 6.9 7.2 6.8  NEUTROABS  --  5.8 6.6 5.7  HGB 12.0 12.3 11.6* 10.4*  HCT 36.5 38.1 36.5 32.4*  MCV 99.5 98.7 100.0 99.1  PLT 194 198 212 235   Basic Metabolic Panel: Recent Labs  Lab 08/24/17 0450 08/25/17 0957 08/26/17 0411 08/27/17 0502  NA 138 138 139 139  K 3.9 4.1 4.3 3.9  CL 98* 100* 101 104  CO2 32 31 32 27  GLUCOSE 112* 112* 124* 102*  BUN 14 12 13 12   CREATININE 1.15* 0.91 0.86 0.77  CALCIUM 9.4 10.0 9.7 9.7  MG 1.9 2.1 2.0 1.8  PHOS  --  3.5 3.0  2.5   GFR: Estimated Creatinine Clearance: 51 mL/min (by C-G formula based on SCr of 0.77 mg/dL). Liver Function Tests: Recent Labs  Lab 08/25/17 0957 08/26/17 0411 08/27/17 0502  AST 27 25 27   ALT 20 22 23   ALKPHOS 55 50 45  BILITOT 0.4 0.3 0.3  PROT 7.1 6.5 6.4*  ALBUMIN 3.3* 2.9* 2.7*   No results for input(s): LIPASE, AMYLASE in the last 168 hours. No results for input(s): AMMONIA in the last 168 hours. Coagulation Profile: No results for input(s): INR, PROTIME in the last 168 hours. Cardiac Enzymes: No results for input(s): CKTOTAL, CKMB, CKMBINDEX, TROPONINI in the last 168 hours. BNP (last 3 results) No results for input(s): PROBNP in the last 8760 hours. HbA1C: No results for input(s): HGBA1C in the last 72 hours. CBG: No results for input(s): GLUCAP in the last  168 hours. Lipid Profile: No results for input(s): CHOL, HDL, LDLCALC, TRIG, CHOLHDL, LDLDIRECT in the last 72 hours. Thyroid Function Tests: No results for input(s): TSH, T4TOTAL, FREET4, T3FREE, THYROIDAB in the last 72 hours. Anemia Panel: No results for input(s): VITAMINB12, FOLATE, FERRITIN, TIBC, IRON, RETICCTPCT in the last 72 hours. Sepsis Labs: No results for input(s): PROCALCITON, LATICACIDVEN in the last 168 hours.  Recent Results (from the past 240 hour(s))  Respiratory Panel by PCR     Status: Abnormal   Collection Time: 08/24/17 12:47 PM  Result Value Ref Range Status   Adenovirus NOT DETECTED NOT DETECTED Final   Coronavirus 229E NOT DETECTED NOT DETECTED Final   Coronavirus HKU1 NOT DETECTED NOT DETECTED Final   Coronavirus NL63 NOT DETECTED NOT DETECTED Final   Coronavirus OC43 NOT DETECTED NOT DETECTED Final   Metapneumovirus NOT DETECTED NOT DETECTED Final   Rhinovirus / Enterovirus DETECTED (A) NOT DETECTED Final   Influenza A NOT DETECTED NOT DETECTED Final   Influenza B NOT DETECTED NOT DETECTED Final   Parainfluenza Virus 1 NOT DETECTED NOT DETECTED Final   Parainfluenza Virus 2 NOT DETECTED NOT DETECTED Final   Parainfluenza Virus 3 NOT DETECTED NOT DETECTED Final   Parainfluenza Virus 4 NOT DETECTED NOT DETECTED Final   Respiratory Syncytial Virus NOT DETECTED NOT DETECTED Final   Bordetella pertussis NOT DETECTED NOT DETECTED Final   Chlamydophila pneumoniae NOT DETECTED NOT DETECTED Final   Mycoplasma pneumoniae NOT DETECTED NOT DETECTED Final    Radiology Studies: No results found. Scheduled Meds: . aspirin  325 mg Oral q morning - 10a  . azithromycin  500 mg Oral Daily  . budesonide (PULMICORT) nebulizer solution  0.25 mg Nebulization BID  . calcium carbonate  3 tablet Oral Q breakfast  . cholecalciferol  1,000 Units Oral Daily  . enoxaparin (LOVENOX) injection  40 mg Subcutaneous Q24H  . guaiFENesin  1,200 mg Oral BID  . ipratropium-albuterol  3  mL Nebulization Q4H  . loratadine  10 mg Oral QPM  . methylPREDNISolone (SOLU-MEDROL) injection  60 mg Intramuscular Q12H  . montelukast  10 mg Oral QHS  . nicotine  14 mg Transdermal Daily  . nortriptyline  50 mg Oral QHS  . omega-3 acid ethyl esters  1 g Oral Daily  . pantoprazole  40 mg Oral Daily  . pravastatin  40 mg Oral q1800  . senna-docusate  1 tablet Oral BID  . sodium chloride flush  3 mL Intravenous Q12H  . vitamin C  500 mg Oral Daily   Continuous Infusions:    LOS: 4 days   Bonnell Public, MD [facility]  Pager [phone]  If 7PM-7AM, please contact night-coverage [email or web address] Password [email or web address] 08/28/2017, 1:42 PM

## 2017-08-28 NOTE — Progress Notes (Signed)
Patients IV occluded. Patient refused new IV access. Paged on call NP requested PO replacement for  Soul-Medrol. NP ordered IM Soul-Medrol. Patient refused. Stating "If I don't want to be stuck once for IV why would I want to be stuck for IM anything". Paged NP again, No new orders given. Dose missed. Patient upset.  Jimmie Molly, RN

## 2017-08-29 DIAGNOSIS — J9611 Chronic respiratory failure with hypoxia: Secondary | ICD-10-CM

## 2017-08-29 DIAGNOSIS — I5032 Chronic diastolic (congestive) heart failure: Secondary | ICD-10-CM

## 2017-08-29 DIAGNOSIS — J441 Chronic obstructive pulmonary disease with (acute) exacerbation: Principal | ICD-10-CM

## 2017-08-29 MED ORDER — PREDNISONE 10 MG PO TABS: ORAL_TABLET | ORAL | 0 refills | Status: DC

## 2017-08-29 NOTE — Discharge Summary (Signed)
Physician Discharge Summary  [name] XLK:[id number] DOB: 1946/09/16 DOA: 08/24/2017  PCP: [name], MD  Admit date: 08/24/2017 Discharge date: 08/29/2017  Recommendations for Outpatient Follow-up:  1. Resolution of COPD exacerbation  2. On CXR: Prominence of the hila bilaterally, favor vascular cannot completely exclude adenopathy. If further evaluation is felt warranted, chest CT with IV contrast may be helpful   Follow-up Information    [name], MD. Schedule an appointment as soon as possible for a visit in 1 week(s).   Specialty:  Internal Medicine Contact information: [address]  [place] [address] [phone]            Discharge Diagnoses:  1. COPD exacerbation secondary to Rhinovirus. 2. Chronic hypoxic resp failure on 3L [place] 3. Chronic diastolic CHF 4. Tobacco use disorder  Discharge Condition: improved Disposition: home  Diet recommendation: heart healthy  Filed Weights   08/26/17 1232 08/27/17 1700 08/29/17 0442  Weight: 57.9 kg (127 lb 10.3 oz) 56.5 kg (124 lb 9 oz) 56.5 kg (124 lb 9 oz)    History of present illness:  60yow PMH COPD on oxygen, severe pulm hypertension presented with SOB. Admitted for COPD exacerbation.   Hospital Course:  Patient was treated for COPD exacerbation with gradual clinical improvement and appears to have returned to baseline. Hospitalization was uncomplicated. Individual issues as below.  COPD exacerbation secondary to Rhinovirus. - appears resolved. Completed Zithromax as inpatient. Complete oral steroid taper as an outpatient.  COPD, chronic hypoxic resp failure on 3L [place] - stable  Severe pulm HTN  Normocytic anemia   Chronic diastolic CHF  Tobacco use disorder  Prominence of the hila bilaterally, favor vascular cannot completely exclude adenopathy. If further evaluation is felt warranted, chest CT with IV contrast may be helpful  Today's assessment: S: feels  back to "normal". Breathing much better. O: Vitals:  Vitals:   08/29/17 0813 08/29/17 1338  BP:  111/72  Pulse:  92  Resp:  16  Temp:  98.1 F (36.7 C)  SpO2: 96% 95%    Constitutional:  . Appears calm and comfortable Respiratory:  . Few crackles, no wheezes or rhonchi . Respiratory effort normal.   Cardiovascular:  . RRR, 2/6 systolic murmur, no r/g . No LE extremity edema   Psychiatric:  . judgement and insight appear normal . Mental status o Mood, affect appropriate   Discharge Instructions  Discharge Instructions    Activity as tolerated - No restrictions   Complete by:  As directed    Diet - low sodium heart healthy   Complete by:  As directed    Discharge instructions   Complete by:  As directed    Call your physician or seek immediate medical attention for fever, shortness of breath, pain or worsening of condition.     Allergies as of 08/29/2017      Reactions   Ceftriaxone Anaphylaxis, Other (See Comments)   *ROCEPHIN*  "Blow up like a balloon"   Hydroxyzine Shortness Of Breath, Other (See Comments)   Pt states med make her light headed, get sob sxs   Doxycycline Nausea And Vomiting   Lexapro [escitalopram] Other (See Comments)   Pt states med make her dizzy      Medication List    TAKE these medications   albuterol 108 (90 Base) MCG/ACT inhaler Commonly known as:  VENTOLIN HFA INHALE 2 PUFFS INTO THE LUNGS EVERY 4 HOURS AS NEEDED What changed:  See the new instructions.  ALPRAZolam 1 MG tablet Commonly known as:  XANAX TAKE 1 TABLET BY MOUTH TWICE A DAY AS NEEDED What changed:    how much to take  how to take this  when to take this   aspirin 325 MG EC tablet Take 1 tablet (325 mg total) by mouth every morning.   ATROVENT HFA 17 MCG/ACT inhaler Generic drug:  ipratropium USE 2 PUFFS INTO THE LUNGS EVERY 4 HOURS AS NEEDED What changed:  See the new instructions.   butalbital-acetaminophen-caffeine 50-325-40 MG tablet Commonly  known as:  FIORICET, ESGIC TAKE 1 TO 2 TABLETS BY MOUTH AS NEEDED FOR HEADACHE   CALCIUM 600 PO Take 1 tablet by mouth daily.   cholecalciferol 1000 units tablet Commonly known as:  VITAMIN D Take 1,000 Units by mouth daily.   diclofenac sodium 1 % Gel Commonly known as:  VOLTAREN APPLY 4 G TOPICALLY DAILY AS NEEDED (APPLY TO HANDS). HAND PAIN   DRY EYES OP Apply 2 drops to eye daily as needed (dry eyes).   fluticasone 50 MCG/ACT nasal spray Commonly known as:  FLONASE USE 2 SPRAYS INTO EACH NOSTRIL ONCE DAILY**REPEAT FOR 5 DAYS THEN STOP What changed:  See the new instructions.   furosemide 40 MG tablet Commonly known as:  LASIX TAKE 1 TABLET BY MOUTH TWICE A DAY   ipratropium-albuterol 0.5-2.5 (3) MG/3ML Soln Commonly known as:  DUONEB Take 3 mLs by nebulization every 4 (four) hours as needed. What changed:  reasons to take this   lactulose (encephalopathy) 10 GM/15ML Soln Commonly known as:  CHRONULAC TAKE 30MLS BY MOUTH 3 TIMES A DAY AS NEEDED FOR CONSTIPATION   levocetirizine 5 MG tablet Commonly known as:  XYZAL Take 1 tablet (5 mg total) by mouth every evening.   LIVALO 2 MG Tabs Generic drug:  Pitavastatin Calcium TAKE 1 TABLET BY MOUTH EVERY DAY   montelukast 10 MG tablet Commonly known as:  SINGULAIR Take 1 tablet (10 mg total) by mouth at bedtime.   nortriptyline 25 MG capsule Commonly known as:  PAMELOR Take 2 capsules (50 mg total) by mouth at bedtime.   Omega 3 1200 MG Caps Take 1,200 mg by mouth every morning.   ondansetron 8 MG tablet Commonly known as:  ZOFRAN Take 1 tablet (8 mg total) by mouth every 8 (eight) hours as needed for nausea or vomiting.   OXYGEN Inhale 3 L into the lungs See admin instructions. Uses when needed during the day, uses continuous throughout the night   pantoprazole 40 MG tablet Commonly known as:  PROTONIX TAKE 1 TABLET BY MOUTH ONCE A DAY   potassium chloride SA 20 MEQ tablet Commonly known as:  KLOR-CON  M20 Take 1 tablet (20 mEq total) by mouth daily.   predniSONE 10 MG tablet Commonly known as:  DELTASONE Take daily by mouth: 40 mg x3 days, then 20 mg x3 days, then 10 mg x3 days, then stop.   SYMBICORT 160-4.5 MCG/ACT inhaler Generic drug:  budesonide-formoterol INHALE 2 PUFFS INTO THE LUNGS 2 (TWO) TIMES DAILY.   vitamin C 500 MG tablet Commonly known as:  ASCORBIC ACID Take 500 mg by mouth daily.      Allergies  Allergen Reactions  . Ceftriaxone Anaphylaxis and Other (See Comments)    *ROCEPHIN*  "Blow up like a balloon"  . Hydroxyzine Shortness Of Breath and Other (See Comments)    Pt states med make her light headed, get sob sxs  . Doxycycline Nausea And Vomiting  . Lexapro [  Escitalopram] Other (See Comments)    Pt states med make her dizzy    The results of significant diagnostics from this hospitalization (including imaging, microbiology, ancillary and laboratory) are listed below for reference.    Significant Diagnostic Studies: Dg Chest 2 View  Result Date: 08/24/2017 CLINICAL DATA:  Shortness of breath for 3 days. EXAM: CHEST  2 VIEW COMPARISON:  Radiograph 02/24/2017, CT 01/01/2015 FINDINGS: Similar cardiomegaly. Aortic atherosclerosis. Pulmonary edema with minimal progression. Right middle lobe and lingular scarring, as seen on CT. No consolidation. Possible trace right pleural effusion. Right apical pleuroparenchymal scarring. No pneumothorax or acute osseous abnormality. IMPRESSION: Pulmonary edema, with mild increase from prior and possible small right pleural effusion. Stable cardiomegaly. Electronically Signed   By: Jeb Levering M.D.   On: 08/24/2017 05:18   Dg Chest Port 1 View  Result Date: 08/26/2017 CLINICAL DATA:  Shortness of breath, cough EXAM: PORTABLE CHEST 1 VIEW COMPARISON:  08/24/2017 FINDINGS: Cardiomegaly. Prominence of the hila bilaterally, likely vascular although adenopathy cannot be excluded. Diffuse interstitial prominence in the lungs.  Bibasilar densities, likely scarring. No effusions or acute bony abnormality. IMPRESSION: Chronic interstitial prominence compatible with chronic interstitial lung disease. Prominence of the hila bilaterally, favor vascular cannot completely exclude adenopathy. If further evaluation is felt warranted, chest CT with IV contrast may be helpful. Bibasilar scarring. Electronically Signed   By: Rolm Baptise M.D.   On: 08/26/2017 09:43    Microbiology: Recent Results (from the past 240 hour(s))  Respiratory Panel by PCR     Status: Abnormal   Collection Time: 08/24/17 12:47 PM  Result Value Ref Range Status   Adenovirus NOT DETECTED NOT DETECTED Final   Coronavirus 229E NOT DETECTED NOT DETECTED Final   Coronavirus HKU1 NOT DETECTED NOT DETECTED Final   Coronavirus NL63 NOT DETECTED NOT DETECTED Final   Coronavirus OC43 NOT DETECTED NOT DETECTED Final   Metapneumovirus NOT DETECTED NOT DETECTED Final   Rhinovirus / Enterovirus DETECTED (A) NOT DETECTED Final   Influenza A NOT DETECTED NOT DETECTED Final   Influenza B NOT DETECTED NOT DETECTED Final   Parainfluenza Virus 1 NOT DETECTED NOT DETECTED Final   Parainfluenza Virus 2 NOT DETECTED NOT DETECTED Final   Parainfluenza Virus 3 NOT DETECTED NOT DETECTED Final   Parainfluenza Virus 4 NOT DETECTED NOT DETECTED Final   Respiratory Syncytial Virus NOT DETECTED NOT DETECTED Final   Bordetella pertussis NOT DETECTED NOT DETECTED Final   Chlamydophila pneumoniae NOT DETECTED NOT DETECTED Final   Mycoplasma pneumoniae NOT DETECTED NOT DETECTED Final     Labs: Basic Metabolic Panel: Recent Labs  Lab 08/24/17 0450 08/25/17 0957 08/26/17 0411 08/27/17 0502  NA 138 138 139 139  K 3.9 4.1 4.3 3.9  CL 98* 100* 101 104  CO2 32 31 32 27  GLUCOSE 112* 112* 124* 102*  BUN 14 12 13 12   CREATININE 1.15* 0.91 0.86 0.77  CALCIUM 9.4 10.0 9.7 9.7  MG 1.9 2.1 2.0 1.8  PHOS  --  3.5 3.0 2.5   Liver Function Tests: Recent Labs  Lab 08/25/17 0957  08/26/17 0411 08/27/17 0502  AST 27 25 27   ALT 20 22 23   ALKPHOS 55 50 45  BILITOT 0.4 0.3 0.3  PROT 7.1 6.5 6.4*  ALBUMIN 3.3* 2.9* 2.7*   CBC: Recent Labs  Lab 08/24/17 0450 08/25/17 0957 08/26/17 0411 08/27/17 0502  WBC 8.6 6.9 7.2 6.8  NEUTROABS  --  5.8 6.6 5.7  HGB 12.0 12.3 11.6* 10.4*  HCT 36.5 38.1 36.5 32.4*  MCV 99.5 98.7 100.0 99.1  PLT 194 198 212 177    Recent Labs    02/03/17 1329 08/24/17 0450  BNP 167.4* 36.6    Principal Problem:   COPD with acute exacerbation (HCC) Active Problems:   Chronic respiratory failure with hypoxia (HCC)   Chronic diastolic heart failure (HCC)   Anxiety   Coronary artery disease   HTN (hypertension)   Acute kidney injury ([place])   Time coordinating discharge: 35 minutes  Signed:  Murray Hodgkins, MD [facility] 08/29/2017, 5:23 PM

## 2017-08-29 NOTE — Progress Notes (Signed)
Patient was given discharge instructions and verbalized understanding of instructions. Patient will be discharged on personal home oxygen.

## 2017-08-30 ENCOUNTER — Telehealth: Payer: Self-pay | Admitting: *Deleted

## 2017-08-30 NOTE — Telephone Encounter (Signed)
Pt was on the TCM list tried calling to make hospital f/u appt no answer LMOM RTC to schedule hosp f/u appt ASAP...[name]

## 2017-08-31 NOTE — Telephone Encounter (Signed)
Transition Care Management Follow-up Telephone Call   Date discharged? 08/29/17   How have you been since you were released from the hospital? Pt states she is alright   Do you understand why you were in the hospital? YES   Do you understand the discharge instructions? YES   Where were you discharged to? Home   Items Reviewed:  Medications reviewed: YES  Allergies reviewed: YES  Dietary changes reviewed: YES- heart healthy  Referrals reviewed: No referral needed   Functional Questionnaire:   Activities of Daily Living (ADLs):   She states she are independent in the following: bathing and hygiene, feeding, continence, grooming, toileting and dressing States she require assistance with the following: ambulation   Any transportation issues/concerns?: NO   Any patient concerns? NO   Confirmed importance and date/time of follow-up visits scheduled YES. appt 09/01/17  Provider Appointment booked with Dr. Ronnald Ramp  Confirmed with patient if condition begins to worsen call PCP or go to the ER.  Patient was given the office number and encouraged to call back with question or concerns.  : YES

## 2017-09-01 ENCOUNTER — Other Ambulatory Visit (INDEPENDENT_AMBULATORY_CARE_PROVIDER_SITE_OTHER): Payer: Medicare Other

## 2017-09-01 ENCOUNTER — Encounter: Payer: Self-pay | Admitting: Internal Medicine

## 2017-09-01 ENCOUNTER — Ambulatory Visit (INDEPENDENT_AMBULATORY_CARE_PROVIDER_SITE_OTHER): Payer: Medicare Other | Admitting: Internal Medicine

## 2017-09-01 VITALS — BP 104/70 | HR 77 | Temp 97.5°F | Resp 20 | Ht 62.0 in | Wt 123.5 lb

## 2017-09-01 DIAGNOSIS — Z720 Tobacco use: Secondary | ICD-10-CM | POA: Diagnosis not present

## 2017-09-01 DIAGNOSIS — E785 Hyperlipidemia, unspecified: Secondary | ICD-10-CM

## 2017-09-01 DIAGNOSIS — J42 Unspecified chronic bronchitis: Secondary | ICD-10-CM | POA: Diagnosis not present

## 2017-09-01 DIAGNOSIS — D539 Nutritional anemia, unspecified: Secondary | ICD-10-CM | POA: Diagnosis not present

## 2017-09-01 DIAGNOSIS — J438 Other emphysema: Secondary | ICD-10-CM | POA: Diagnosis not present

## 2017-09-01 LAB — CBC WITH DIFFERENTIAL/PLATELET
Basophils Absolute: 0 10*3/uL (ref 0.0–0.1)
Basophils Relative: 0.3 % (ref 0.0–3.0)
Eosinophils Absolute: 0 10*3/uL (ref 0.0–0.7)
Eosinophils Relative: 0.3 % (ref 0.0–5.0)
HCT: 39.3 % (ref 36.0–46.0)
Hemoglobin: 13 g/dL (ref 12.0–15.0)
Lymphocytes Relative: 9.1 % — ABNORMAL LOW (ref 12.0–46.0)
Lymphs Abs: 0.9 10*3/uL (ref 0.7–4.0)
MCHC: 33.2 g/dL (ref 30.0–36.0)
MCV: 97 fl (ref 78.0–100.0)
Monocytes Absolute: 0.2 10*3/uL (ref 0.1–1.0)
Monocytes Relative: 2.3 % — ABNORMAL LOW (ref 3.0–12.0)
Neutro Abs: 9.1 10*3/uL — ABNORMAL HIGH (ref 1.4–7.7)
Neutrophils Relative %: 88 % — ABNORMAL HIGH (ref 43.0–77.0)
Platelets: 302 10*3/uL (ref 150.0–400.0)
RBC: 4.05 Mil/uL (ref 3.87–5.11)
RDW: 12.7 % (ref 11.5–15.5)
WBC: 10.3 10*3/uL (ref 4.0–10.5)

## 2017-09-01 LAB — FOLATE: Folate: >23.9 ng/mL (ref >5.9)

## 2017-09-01 LAB — FERRITIN: Ferritin: 217.8 ng/mL (ref 10.0–291.0)

## 2017-09-01 LAB — LIPID PANEL
Cholesterol: 174 mg/dL (ref 0–200)
HDL: 76.4 mg/dL (ref >39.00)
LDL Cholesterol: 81 mg/dL (ref 0–99)
NonHDL: 97.79
Total CHOL/HDL Ratio: 2
Triglycerides: 82 mg/dL (ref 0.0–149.0)
VLDL: 16.4 mg/dL (ref 0.0–40.0)

## 2017-09-01 LAB — IBC PANEL
Iron: 93 ug/dL (ref 42–145)
Saturation Ratios: 29.5 % (ref 20.0–50.0)
Transferrin: 225 mg/dL (ref 212.0–360.0)

## 2017-09-01 LAB — VITAMIN B12: Vitamin B-12: 555 pg/mL (ref 211–911)

## 2017-09-01 MED ORDER — SYMBICORT 160-4.5 MCG/ACT IN AERO: 2.0000 | INHALATION_SPRAY | Freq: Two times a day (BID) | RESPIRATORY_TRACT | 11 refills | Status: DC

## 2017-09-01 NOTE — Patient Instructions (Signed)
Anemia, Nonspecific Anemia is a condition in which the concentration of red blood cells or hemoglobin in the blood is below normal. Hemoglobin is a substance in red blood cells that carries oxygen to the tissues of the body. Anemia results in not enough oxygen reaching these tissues. What are the causes? Common causes of anemia include:  Excessive bleeding. Bleeding may be internal or external. This includes excessive bleeding from periods (in women) or from the intestine.  Poor nutrition.  Chronic kidney, thyroid, and liver disease.  Bone marrow disorders that decrease red blood cell production.  Cancer and treatments for cancer.  HIV, AIDS, and their treatments.  Spleen problems that increase red blood cell destruction.  Blood disorders.  Excess destruction of red blood cells due to infection, medicines, and autoimmune disorders. What are the signs or symptoms?  Minor weakness.  Dizziness.  Headache.  Palpitations.  Shortness of breath, especially with exercise.  Paleness.  Cold sensitivity.  Indigestion.  Nausea.  Difficulty sleeping.  Difficulty concentrating. Symptoms may occur suddenly or they may develop slowly. How is this diagnosed? Additional blood tests are often needed. These help your health care provider determine the best treatment. Your health care provider will check your stool for blood and look for other causes of blood loss. How is this treated? Treatment varies depending on the cause of the anemia. Treatment can include:  Supplements of iron, vitamin B12, or folic acid.  Hormone medicines.  A blood transfusion. This may be needed if blood loss is severe.  Hospitalization. This may be needed if there is significant continual blood loss.  Dietary changes.  Spleen removal. Follow these instructions at home: Keep all follow-up appointments. It often takes many weeks to correct anemia, and having your health care provider check on your  condition and your response to treatment is very important. Get help right away if:  You develop extreme weakness, shortness of breath, or chest pain.  You become dizzy or have trouble concentrating.  You develop heavy vaginal bleeding.  You develop a rash.  You have bloody or black, tarry stools.  You faint.  You vomit up blood.  You vomit repeatedly.  You have abdominal pain.  You have a fever or persistent symptoms for more than 2-3 days.  You have a fever and your symptoms suddenly get worse.  You are dehydrated. This information is not intended to replace advice given to you by your health care provider. Make sure you discuss any questions you have with your health care provider. Document Released: 10/21/2004 Document Revised: 02/25/2016 Document Reviewed: 03/09/2013 Elsevier Interactive Patient Education  2017 [employer] Inc[employer]  

## 2017-09-01 NOTE — Progress Notes (Signed)
Subjective:  Patient ID: [name], female    DOB: Oct 27, 1945  Age: 71 y.o. MRN: [id number]  CC: COPD; Anemia; and Hyperlipidemia   HPI [name] presents for follow-up after recent admission for COPD exacerbation.  She tells me she is feeling much better with a marked improvement in her shortness of breath, cough, and wheezing.  During the admission she was found to be anemic.  She is not aware of any sources of blood loss.  She takes a multivitamin every day.  Outpatient Medications Prior to Visit  Medication Sig Dispense Refill  . albuterol (VENTOLIN HFA) 108 (90 Base) MCG/ACT inhaler INHALE 2 PUFFS INTO THE LUNGS EVERY 4 HOURS AS NEEDED (Patient taking differently: INHALE 2 PUFFS INTO THE LUNGS EVERY 4 HOURS AS NEEDED FOR SOB) 36 Inhaler 5  . ALPRAZolam (XANAX) 1 MG tablet TAKE 1 TABLET BY MOUTH TWICE A DAY AS NEEDED (Patient taking differently: TAKE 1 TABLET BY MOUTH TWICE A DAY AS NEEDED FOR ANXIETY.) 60 tablet 2  . Artificial Tear Ointment (DRY EYES OP) Apply 2 drops to eye daily as needed (dry eyes).     Marland Kitchen aspirin 325 MG EC tablet Take 1 tablet (325 mg total) by mouth every morning.    . ATROVENT HFA 17 MCG/ACT inhaler USE 2 PUFFS INTO THE LUNGS EVERY 4 HOURS AS NEEDED (Patient taking differently: USE 2 PUFFS INTO THE LUNGS EVERY 4 HOURS AS NEEDED FOR SOB) 77.4 Inhaler 5  . butalbital-acetaminophen-caffeine (FIORICET, ESGIC) 50-325-40 MG tablet TAKE 1 TO 2 TABLETS BY MOUTH AS NEEDED FOR HEADACHE 60 tablet 5  . Calcium Carbonate (CALCIUM 600 PO) Take 1 tablet by mouth daily.     . cholecalciferol (VITAMIN D) 1000 units tablet Take 1,000 Units by mouth daily.    . diclofenac sodium (VOLTAREN) 1 % GEL APPLY 4 G TOPICALLY DAILY AS NEEDED (APPLY TO HANDS). HAND PAIN  2  . fluticasone (FLONASE) 50 MCG/ACT nasal spray USE 2 SPRAYS INTO EACH NOSTRIL ONCE DAILY**REPEAT FOR 5 DAYS THEN STOP (Patient taking differently: USE 2 SPRAYS INTO EACH NOSTRIL ONCE DAILY AS NEEDED FOR ALLERGIES  **REPEAT FOR 5 DAYS THEN STOP) 16 g 11  . furosemide (LASIX) 40 MG tablet TAKE 1 TABLET BY MOUTH TWICE A DAY 180 tablet 1  . ipratropium-albuterol (DUONEB) 0.5-2.5 (3) MG/3ML SOLN Take 3 mLs by nebulization every 4 (four) hours as needed. (Patient taking differently: Take 3 mLs by nebulization every 4 (four) hours as needed (SOB). ) 360 mL 0  . lactulose, encephalopathy, (CHRONULAC) 10 GM/15ML SOLN TAKE 30MLS BY MOUTH 3 TIMES A DAY AS NEEDED FOR CONSTIPATION 1892 mL 1  . levocetirizine (XYZAL) 5 MG tablet Take 1 tablet (5 mg total) by mouth every evening. 90 tablet 3  . LIVALO 2 MG TABS TAKE 1 TABLET BY MOUTH EVERY DAY 90 tablet 1  . montelukast (SINGULAIR) 10 MG tablet Take 1 tablet (10 mg total) by mouth at bedtime. 30 tablet 11  . nortriptyline (PAMELOR) 25 MG capsule Take 2 capsules (50 mg total) by mouth at bedtime. 180 capsule 1  . Omega 3 1200 MG CAPS Take 1,200 mg by mouth every morning.     . ondansetron (ZOFRAN) 8 MG tablet Take 1 tablet (8 mg total) by mouth every 8 (eight) hours as needed for nausea or vomiting. 30 tablet 2  . OXYGEN-HELIUM IN Inhale 3 L into the lungs See admin instructions. Uses when needed during the day, uses continuous throughout the night    .  pantoprazole (PROTONIX) 40 MG tablet TAKE 1 TABLET BY MOUTH ONCE A DAY 90 tablet 3  . potassium chloride SA (KLOR-CON M20) 20 MEQ tablet Take 1 tablet (20 mEq total) by mouth daily. 90 tablet 3  . predniSONE (DELTASONE) 10 MG tablet Take daily by mouth: 40 mg x3 days, then 20 mg x3 days, then 10 mg x3 days, then stop. 21 tablet 0  . vitamin C (ASCORBIC ACID) 500 MG tablet Take 500 mg by mouth daily.    . SYMBICORT 160-4.5 MCG/ACT inhaler INHALE 2 PUFFS INTO THE LUNGS 2 (TWO) TIMES DAILY. 10.2 Inhaler 11   No facility-administered medications prior to visit.     ROS Review of Systems  Constitutional: Negative for chills, diaphoresis, fatigue and fever.  HENT: Negative.  Negative for sinus pressure, sore throat and  trouble swallowing.   Eyes: Negative.   Respiratory: Positive for cough, shortness of breath and wheezing.   Cardiovascular: Negative for chest pain, palpitations and leg swelling.  Gastrointestinal: Positive for constipation. Negative for abdominal pain, anal bleeding, blood in stool, diarrhea, nausea and vomiting.  Endocrine: Negative.   Genitourinary: Negative.  Negative for difficulty urinating.  Musculoskeletal: Negative for back pain and myalgias.  Skin: Negative.  Negative for color change and rash.  Allergic/Immunologic: Negative.   Neurological: Negative.  Negative for dizziness and weakness.  Hematological: Negative for adenopathy. Does not bruise/bleed easily.  Psychiatric/Behavioral: Negative.     Objective:  BP 104/70 (BP Location: Left Arm, Patient Position: Sitting, Cuff Size: Normal)   Pulse 77   Temp (!) 97.5 F (36.4 C) (Oral)   Resp 20   Ht 5\' 2"  (1.575 m)   Wt 123 lb 8 oz (56 kg)   SpO2 96%   BMI 22.59 kg/m   BP Readings from Last 3 Encounters:  09/01/17 104/70  08/29/17 111/72  08/16/17 102/64    Wt Readings from Last 3 Encounters:  09/01/17 123 lb 8 oz (56 kg)  08/29/17 124 lb 9 oz (56.5 kg)  08/16/17 122 lb 6.4 oz (55.5 kg)    Physical Exam  Constitutional: She is oriented to person, place, and time.  Non-toxic appearance. She has a sickly appearance (on O2). She does not appear ill. No distress.  HENT:  Mouth/Throat: Oropharynx is clear and moist. No oropharyngeal exudate.  Eyes: Conjunctivae are normal. No scleral icterus.  Neck: Normal range of motion. Neck supple. No JVD present. No thyromegaly present.  Cardiovascular: Normal rate, regular rhythm and normal heart sounds.  No murmur heard. Pulmonary/Chest: Accessory muscle usage present. Tachypnea noted. No respiratory distress. She has no decreased breath sounds. She has wheezes in the right upper field, the right middle field, the right lower field, the left upper field, the left middle  field and the left lower field. She has no rhonchi. She has no rales.  There are diffuse, bilateral, inspiratory wheezes with good air movement.  Abdominal: Soft. Bowel sounds are normal. She exhibits no distension and no mass. There is no tenderness. There is no guarding.  Musculoskeletal: Normal range of motion. She exhibits no edema or tenderness.  Lymphadenopathy:    She has no cervical adenopathy.  Neurological: She is alert and oriented to person, place, and time.  Skin: Skin is warm and dry. No rash noted. She is not diaphoretic. No erythema. No pallor.  Vitals reviewed.   Lab Results  Component Value Date   WBC 10.3 09/01/2017   HGB 13.0 09/01/2017   HCT 39.3 09/01/2017   PLT  302.0 09/01/2017   GLUCOSE 102 (H) 08/27/2017   CHOL 174 09/01/2017   TRIG 82.0 09/01/2017   HDL 76.40 09/01/2017   LDLCALC 81 09/01/2017   ALT 23 08/27/2017   AST 27 08/27/2017   NA 139 08/27/2017   K 3.9 08/27/2017   CL 104 08/27/2017   CREATININE 0.77 08/27/2017   BUN 12 08/27/2017   CO2 27 08/27/2017   TSH 1.17 12/21/2016   INR 0.87 09/05/2013   HGBA1C 5.3 10/16/2015    Dg Chest 2 View  Result Date: 08/24/2017 CLINICAL DATA:  Shortness of breath for 3 days. EXAM: CHEST  2 VIEW COMPARISON:  Radiograph 02/24/2017, CT 01/01/2015 FINDINGS: Similar cardiomegaly. Aortic atherosclerosis. Pulmonary edema with minimal progression. Right middle lobe and lingular scarring, as seen on CT. No consolidation. Possible trace right pleural effusion. Right apical pleuroparenchymal scarring. No pneumothorax or acute osseous abnormality. IMPRESSION: Pulmonary edema, with mild increase from prior and possible small right pleural effusion. Stable cardiomegaly. Electronically Signed   By: Jeb Levering M.D.   On: 08/24/2017 05:18    Assessment & Plan:   Staisha was seen today for copd, anemia and hyperlipidemia.  Diagnoses and all orders for this visit:  Hyperlipidemia LDL goal <100- She has achieved her LDL  goal and is doing well on the statin. 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  Screening for vitamin deficiencies is negative. -     CBC with Differential/Platelet; Future -     IBC panel; Future -     Vitamin B12; Future -     Ferritin; Future -     Folate; Future -     Vitamin B1; Future  Other emphysema (HCC) -     SYMBICORT 160-4.5 MCG/ACT inhaler; Inhale 2 puffs into the lungs 2 (two) times daily.  Chronic bronchitis, unspecified chronic bronchitis type ([place])- Improvement noted today after the recent exacerbation.  She was again encouraged to quit smoking. Will continue the combination of albuterol, Atrovent, LABA/ICS combination and continuous O2. -     SYMBICORT 160-4.5 MCG/ACT inhaler; Inhale 2 puffs into the lungs 2 (two) times daily.   I am having [name] "Cameron Ali" maintain her OXYGEN-HELIUM IN, Omega 3, aspirin, diclofenac sodium, cholecalciferol, Artificial Tear Ointment (DRY EYES OP), vitamin C, fluticasone, pantoprazole, montelukast, levocetirizine, ipratropium-albuterol, butalbital-acetaminophen-caffeine, ondansetron, lactulose (encephalopathy), ATROVENT HFA, albuterol, LIVALO, potassium chloride SA, Calcium Carbonate (CALCIUM 600 PO), furosemide, ALPRAZolam, nortriptyline, predniSONE, and SYMBICORT.  Meds ordered this encounter  Medications  . SYMBICORT 160-4.5 MCG/ACT inhaler    Sig: Inhale 2 puffs into the lungs 2 (two) times daily.    Dispense:  10.2 Inhaler    Refill:  11     Follow-up: Return in about 3 months (around 11/30/2017).  Scarlette Calico, MD

## 2017-09-02 NOTE — Telephone Encounter (Signed)
Received CMN to be completed for oxygen. This was previously faxed to [facility]. Re-faxed paperwork.

## 2017-09-03 ENCOUNTER — Encounter: Payer: Self-pay | Admitting: Internal Medicine

## 2017-09-05 LAB — VITAMIN B1: Vitamin B1 (Thiamine): 16 nmol/L (ref 8–30)

## 2017-09-09 ENCOUNTER — Encounter: Payer: Self-pay | Admitting: Internal Medicine

## 2017-09-09 ENCOUNTER — Telehealth: Payer: Self-pay | Admitting: Internal Medicine

## 2017-09-09 MED ORDER — DENOSUMAB 60 MG/ML ~~LOC~~ SOLN: 60.0000 mg | SUBCUTANEOUS | 0 refills | Status: DC

## 2017-09-09 NOTE — Telephone Encounter (Signed)
Patient is calling about her injection. He state it is approved by insurance and she does not need to pay anything. That you will send the RX to the pharmacy and she will bring it here for the injection. Please follow up with patient.   She was made aware [name] is out of office.

## 2017-09-09 NOTE — Telephone Encounter (Signed)
Faxed to [facility] signed certificate of medical necessity for oxygen

## 2017-09-13 ENCOUNTER — Telehealth: Payer: Self-pay

## 2017-09-13 NOTE — Telephone Encounter (Signed)
Patient's next prolia injection is due at patient's earliest convenience, can come in on nurse schedule, but must be done by end of this year in order to guarantee current summary of benefits----patient can use her pharmacy benefits if she wants to go to the pharmacy and pick up injection and bring to [place] office for administration---this is the cheapest way, however, patient has to pick up and bring to me----if they want to use medical side of insurance, estimated $215 copay is due on the day of injection---can talk with Jonelle Sidle, Therapist, sports at [place] office if any questions

## 2017-09-14 ENCOUNTER — Telehealth: Payer: Self-pay

## 2017-09-14 MED ORDER — DENOSUMAB 60 MG/ML ~~LOC~~ SOLN: 60.0000 mg | Freq: Once | SUBCUTANEOUS | 0 refills | Status: AC

## 2017-09-15 ENCOUNTER — Ambulatory Visit: Payer: Medicare Other

## 2017-09-19 ENCOUNTER — Ambulatory Visit (INDEPENDENT_AMBULATORY_CARE_PROVIDER_SITE_OTHER): Payer: Medicare Other

## 2017-09-19 DIAGNOSIS — M810 Age-related osteoporosis without current pathological fracture: Secondary | ICD-10-CM | POA: Diagnosis not present

## 2017-09-19 MED ORDER — DENOSUMAB 60 MG/ML ~~LOC~~ SOLN
60.0000 mg | Freq: Once | SUBCUTANEOUS | Status: AC
  Administered 2017-09-19: 60 mg via SUBCUTANEOUS

## 2017-09-28 ENCOUNTER — Other Ambulatory Visit: Payer: Self-pay | Admitting: Internal Medicine

## 2017-09-29 ENCOUNTER — Other Ambulatory Visit: Payer: Self-pay | Admitting: Internal Medicine

## 2017-10-09 ENCOUNTER — Other Ambulatory Visit: Payer: Self-pay | Admitting: Internal Medicine

## 2017-10-11 NOTE — Telephone Encounter (Signed)
error 

## 2017-10-12 ENCOUNTER — Ambulatory Visit: Payer: Self-pay | Admitting: Internal Medicine

## 2017-10-20 IMAGING — MR MR 3D RECON AT SCANNER
19 of 23 series · 19 of 23 positions shown · IV contrast (multihance)
Comparison: CT of the abdomen and pelvis 09/04/2015.

CLINICAL DATA: 69-year-old female with history of severe abdominal
pain yesterday evening. CT scan demonstrated findings concerning for
potential cholangitis, and stone in the distal common bile duct.

EXAM:
MRI ABDOMEN WITHOUT AND WITH CONTRAST (INCLUDING MRCP)
TECHNIQUE: Multiplanar multisequence MR imaging of the abdomen was performed
both before and after the administration of intravenous contrast.
Heavily T2-weighted images of the biliary and pancreatic ducts were
obtained, and three-dimensional MRCP images were rendered by post
processing.
CONTRAST:  11mL MULTIHANCE GADOBENATE DIMEGLUMINE 529 MG/ML IV SOLN

[Series 3: T2 fat-sat · axial · 5.0mm · 0.78mm/px · 1 of 52 slices shown]
[im 1/52]
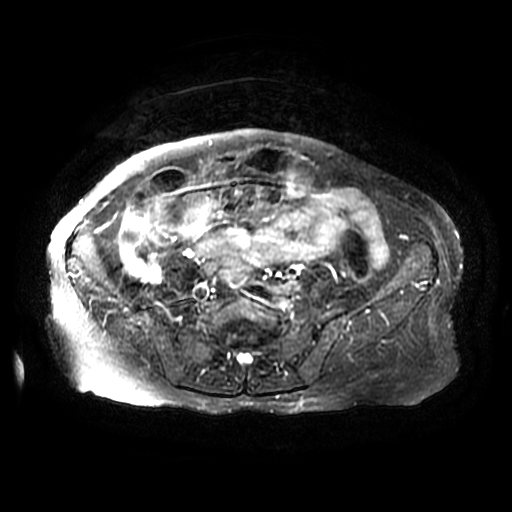

[Series 4: MRCP · coronal · 1.6mm · 0.62mm/px · 1 of 96 slices shown (1 of 2)]
[im 1/96]
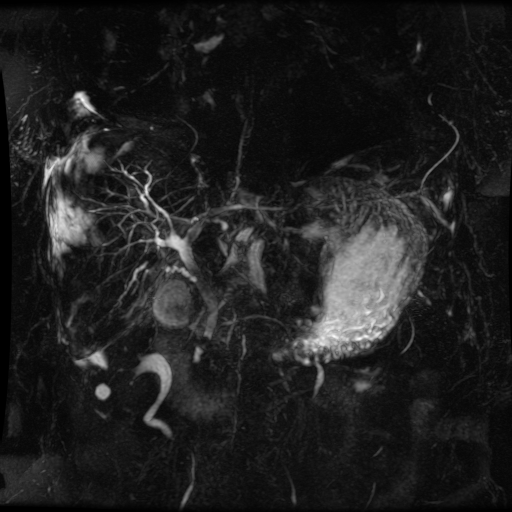

[Series 6: DWI b500 · axial · 6.0mm · 1.48mm/px · 1 of 68 slices shown]
[im 1/68]
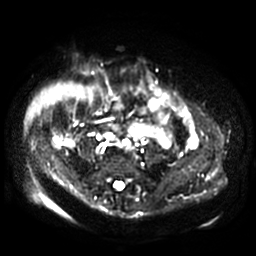

[Series 8: ax dualecho · axial · 5.0mm · 0.78mm/px · 1 of 96 slices shown]
[im 1/96]
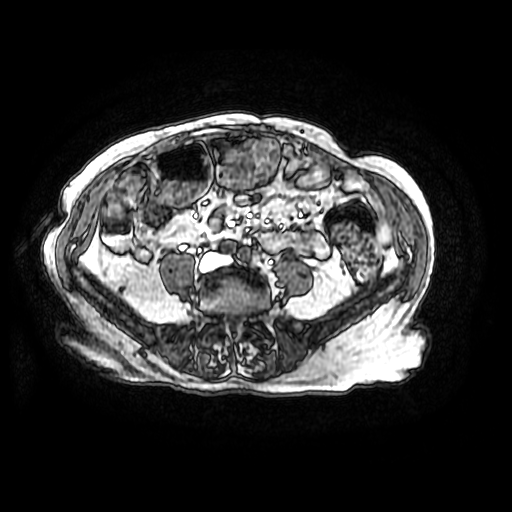

[Series 9: MRCP · coronal · 40.0mm · 0.70mm/px · 1 of 6 slices shown (2 of 2)]
[im 1/6]
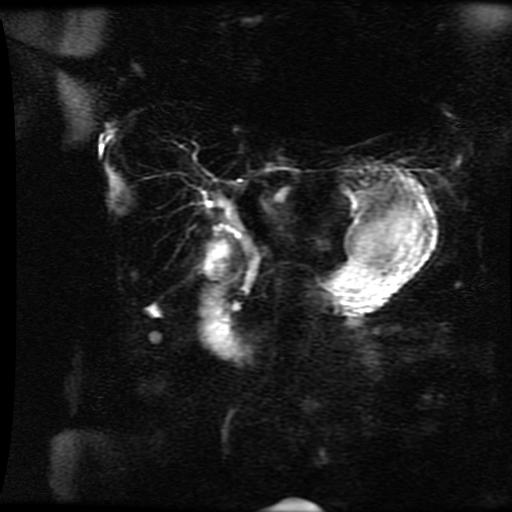

[Series 10: bSSFP fat-sat · coronal · 5.0mm · 0.70mm/px · 1 of 39 slices shown]
[im 1/39]
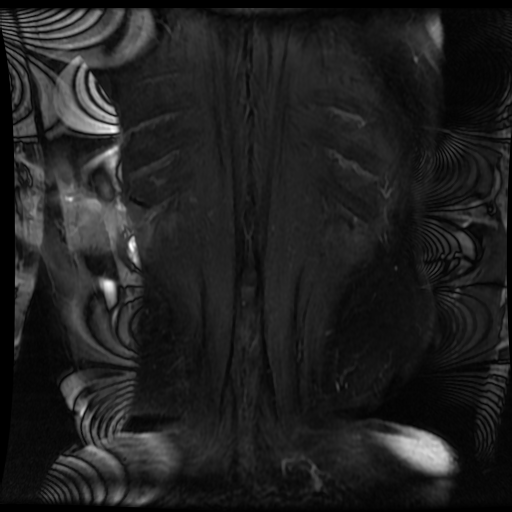

[Series 11: T2 · axial · 5.0mm · 0.78mm/px · 1 of 48 slices shown]
[im 1/48]
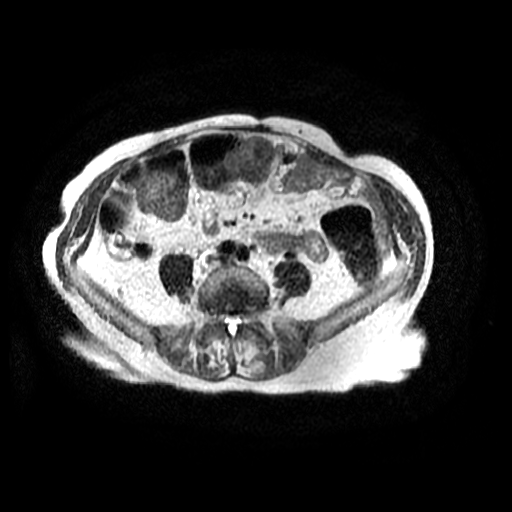

[Series 400: reformatted · axial · 1.6mm · 0.62mm/px · 1 of 89 slices shown (1 of 2)]
[im 1/89]
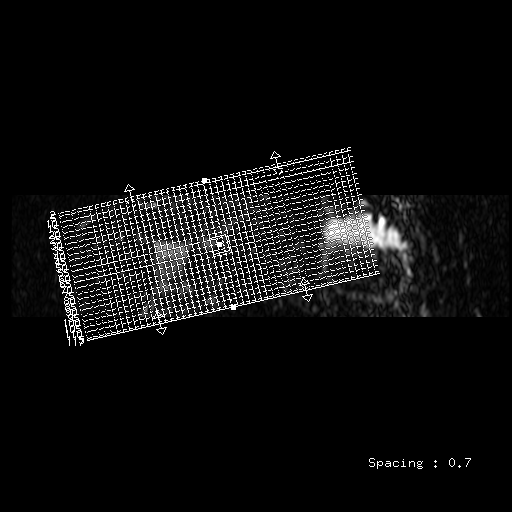

[Series 401: reformatted · coronal · 100.0mm · 0.51mm/px · 1 of 83 slices shown (2 of 2)]
[im 1/83]
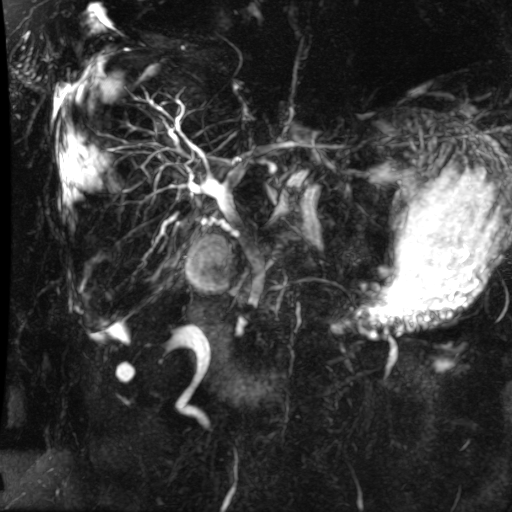

[Series 600: DWI · axial · 6.0mm · 1.48mm/px · 1 of 34 slices shown]
[im 1/34]
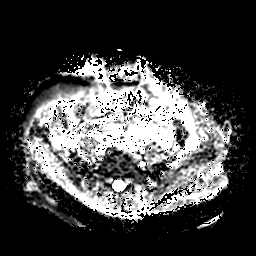

[Series 1200: T1 dynamic · axial · 5.0mm · 0.78mm/px · 1 of 92 slices shown (1 of 5)]
[im 1/92]
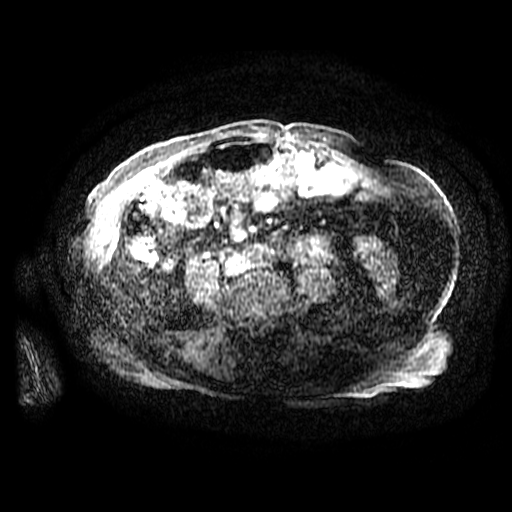

[Series 1201: T1 dynamic · axial · 5.0mm · 0.78mm/px · 1 of 92 slices shown (2 of 5)]
[im 1/92]
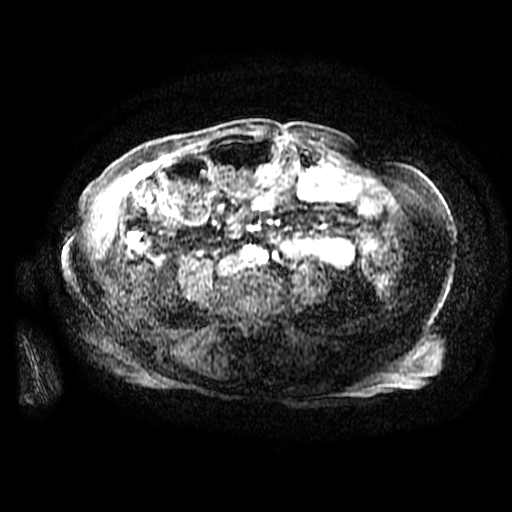

[Series 1203: T1 dynamic · axial · 5.0mm · 0.78mm/px · 1 of 92 slices shown (3 of 5)]
[im 1/92]
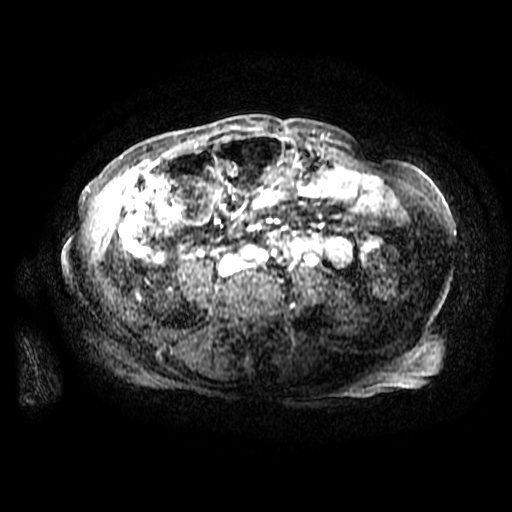

[Series 1204: T1 dynamic · axial · 5.0mm · 0.78mm/px · 1 of 92 slices shown (4 of 5)]
[im 1/92]
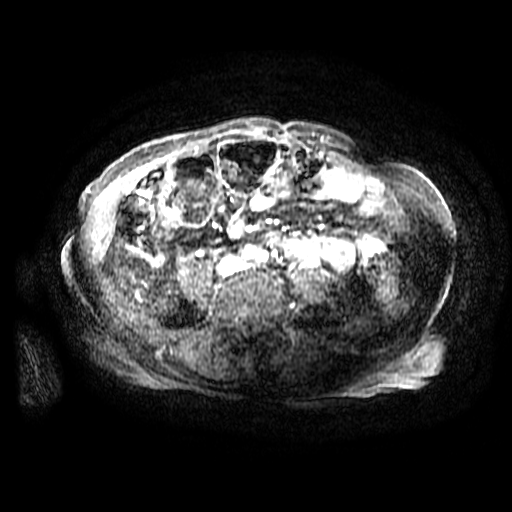

[Series 1205: T1 dynamic · axial · 5.0mm · 0.78mm/px · 1 of 92 slices shown (5 of 5)]
[im 1/92]
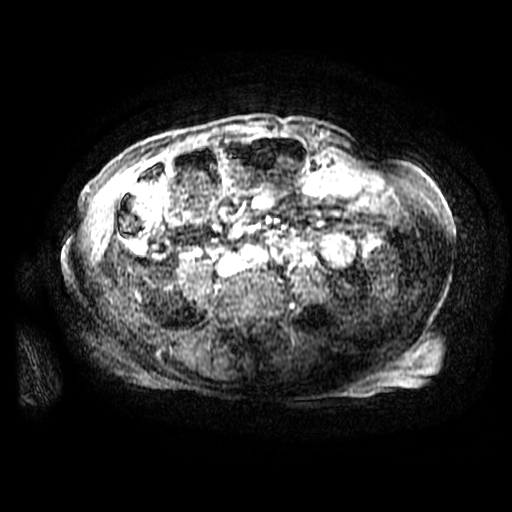

[((id)/(id)/1)-((id)/(id)/1) · axial · 5.0mm · 0.78mm/px · 1 of 92 slices shown (1 of 4)]
[im 1/92]
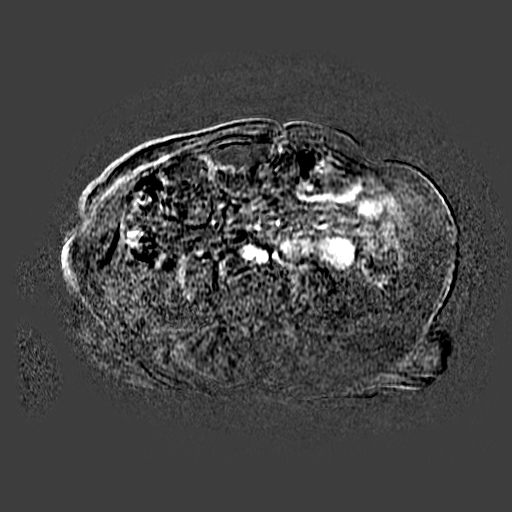

[((id)/(id)/1)-((id)/(id)/1) · axial · 5.0mm · 0.78mm/px · 1 of 92 slices shown (2 of 4)]
[im 1/92]
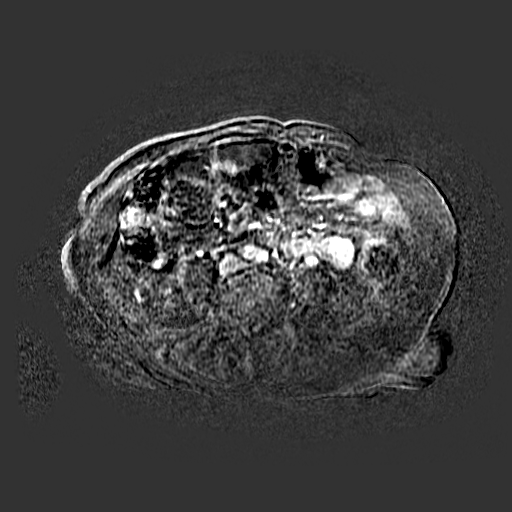

[((id)/(id)/1)-((id)/(id)/1) · axial · 5.0mm · 0.78mm/px · 1 of 92 slices shown (3 of 4)]
[im 1/92]
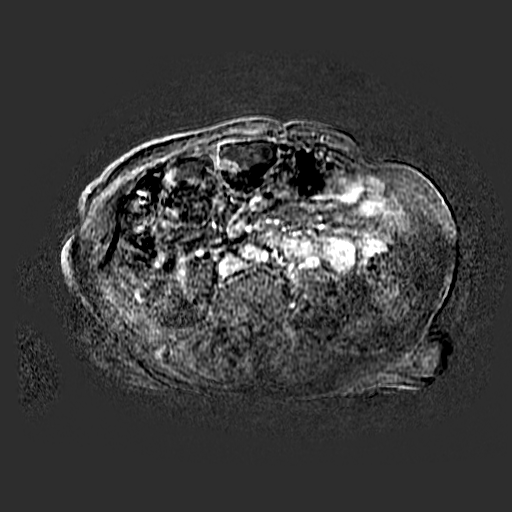

[((id)/(id)/1)-((id)/(id)/1) · axial · 5.0mm · 0.78mm/px · 1 of 92 slices shown (4 of 4)]
[im 1/92]
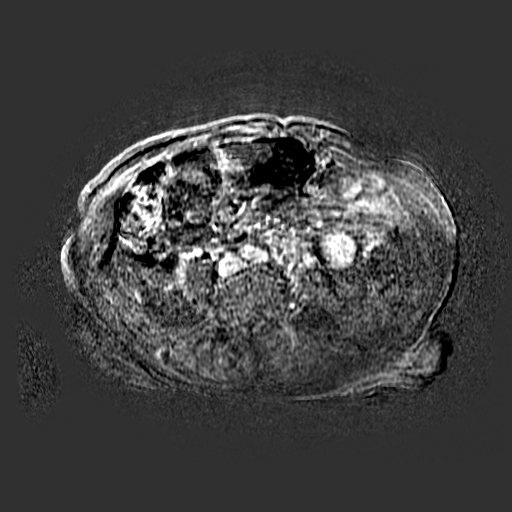

[19 of 23 positions shown; findings below may reference images not displayed]

FINDINGS: Lower chest: Lipomatous hypertrophy of the interatrial septum.
Otherwise, unremarkable.

Hepatobiliary: Mild diffuse increased T2 signal intensity in a
periportal distribution throughout the liver. MRCP images
demonstrate minimal prominence of the intrahepatic bile ducts.
Common bile duct is mildly dilated measuring 8 mm within the porta
hepatis. In addition, there is a 8 mm filling defect in the distal
common bile duct shortly before the ampulla, compatible with a
retained ductal stone. No suspicious cystic or solid hepatic lesions
are identified. No significant periportal enhancement identified on
post gadolinium images. Status post cholecystectomy.

Pancreas: No pancreatic mass. No pancreatic ductal dilatation. No
pancreatic or peripancreatic fluid or inflammatory changes.

Spleen: Spleen is normal in appearance.

Adrenals/Urinary Tract: Focal area of cortical thinning in the
posterior aspect of the upper pole of left kidney, most compatible
with chronic post infectious or inflammatory scarring. Sub cm
lesions in the kidneys bilaterally are low T1 signal intensity, high
T2 signal intensity, and do not enhance, compatible with tiny simple
cysts, measuring up to 9 mm in the lower pole of the right kidney.
No aggressive renal lesions identified. No hydroureteronephrosis in
the visualized abdomen.

Stomach/Bowel: Visualized portions are unremarkable.

Vascular/Lymphatic: Atherosclerosis in the visualized abdominal
vasculature, without evidence of aneurysm. No lymphadenopathy noted
in the abdomen.

Other: No significant volume of ascites in the visualized peritoneal
cavity.

Musculoskeletal: No aggressive osseous lesions are identified in the
visualized portions of the peritoneal cavity.
IMPRESSION: 1. 8 mm filling defect in the distal common bile duct shortly before
the ampulla, compatible with a retained ductal stone. This appears
to be a chronic finding and has been present on prior studies dating
back to 01/30/2012.
2. While there is minimal intrahepatic biliary ductal prominence and
slight dilatation of the common bile duct, these findings are
commonly seen in the setting of benign post cholecystectomy
physiology.
3. There is a small amount of mild periportal edema on today's
examination. No periportal enhancement to suggest cholangitis at
this time.
4. Atherosclerosis.
5. Additional incidental findings, as above.

## 2017-11-30 NOTE — Progress Notes (Signed)
This encounter was created in error - please disregard.

## 2017-12-07 ENCOUNTER — Encounter: Payer: Self-pay | Admitting: Internal Medicine

## 2017-12-09 ENCOUNTER — Ambulatory Visit (INDEPENDENT_AMBULATORY_CARE_PROVIDER_SITE_OTHER): Payer: Medicare Other | Admitting: Internal Medicine

## 2017-12-09 ENCOUNTER — Encounter: Payer: Self-pay | Admitting: Internal Medicine

## 2017-12-09 VITALS — BP 122/72 | HR 76 | Ht 62.0 in | Wt 123.0 lb

## 2017-12-09 DIAGNOSIS — I2510 Atherosclerotic heart disease of native coronary artery without angina pectoris: Secondary | ICD-10-CM

## 2017-12-09 DIAGNOSIS — I350 Nonrheumatic aortic (valve) stenosis: Secondary | ICD-10-CM

## 2017-12-09 DIAGNOSIS — J449 Chronic obstructive pulmonary disease, unspecified: Secondary | ICD-10-CM | POA: Diagnosis not present

## 2017-12-09 DIAGNOSIS — Z9981 Dependence on supplemental oxygen: Secondary | ICD-10-CM

## 2017-12-09 NOTE — Patient Instructions (Signed)
Your physician wants you to follow-up in: 6 months with Dr. Hilty. You will receive a reminder letter in the mail two months in advance. If you don't receive a letter, please call our office to schedule the follow-up appointment.    

## 2017-12-09 NOTE — Progress Notes (Signed)
OFFICE NOTE  Chief Complaint:  No complaints  Primary Care Physician: [name], MD  HPI:  [name] a pleasant 72 year old female with a history of COPD and severe pulmonary hypertension. Recently she was put on home oxygen and has been on an oxygen concentrator, which has made a marked improvement in her ability to get around. She has previously seen Dr. Lamonte Sakai with pulmonology; however, that did not go too well and she has basically given up on any further treatments for COPD other than her oxygen. She still uses nebulizers as needed when she is tight, and I recommended Claritin for seasonal allergies today. As you know, she also has mild aortic stenosis and we are continuing to follow that. She is a low-risk stress test in November 2012. Unfortunately she was recently admitted for altered mental status, possible Tylenol overdose, respiratory failure and liver failure. All of which seems to have resolved. She seems to be doing pre-well after discharge.  She returns today for followup. She reports doing fairly well. She has seen Dr. Lake Bells in the interim and he is recommended continue current therapies. She is now seeing a different internist, Dr. Ronnald Ramp. He was recently admitted the hospital for pneumonia and treated accordingly. She had troponins which were negative suggesting they're likely is no significant underlying obstructive coronary disease. She reports she's recovered and is back to her base. She is overdue for repeat lipid profile.  I saw [name] back in the office today. Overall she reports she is doing fairly well. She's not been admitted to the hospital recently. She says she has stable shortness of breath. She denies any chest pain. It should be noted that she does have mild aortic stenosis which will last assessed in 2014.  [name] returns today for follow-up. She reports that she is doing fairly well. Fortune she's not been hospitalized. She has had an  episode of COPD exacerbation which was supported with early antibiotics. She denies any worsening chest pain and has pretty stable shortness of breath on oxygen. She is able to ambulate and do most of her activities without any restrictions.  03/31/2016  [name] was seen back today in the office for follow-up. She seems to be doing fairly well although does use her oxygen almost continuously. Saturation was low today although she had heavy fingernail polish on and therefore the reading may not be accurate as it read 88%. She was on oxygen via a concentrator. Blood pressure was stable at 108/74. Weight is been stable. She recently had a repeat echocardiogram which shows stable mild aortic stenosis and normal LV function with some mild improvement in pulmonary pressures in May. She denies any chest pain. Fortune that she's not been admitted to the hospital recently for any pneumonia but did have problems with recent gallbladder disease and had cholecystectomy.  12/17/2016  [name] returns today for follow-up. She's done well over the last year has not been hospitalized. She reports stable shortness of breath. She had mild aortic stenosis which is stable by echo last year and will need to be repeated in May of this year. Her last lipid profile showed total cholesterol 197, HDL C 78, triglycerides 100, an LDL-C 99. We'll need to repeat her lipid profile as well.  06/20/2017  I saw [name] back today for follow-up. She is doing well. We repeated on echo in 01/2017, this demonstrated normal LVEF 60-65%, mild stable aortic stenosis. She denies any worsening shortness of breath or chest  pain. Cholesterol remains elevated. Will plan to recheck that today. She could conceivably increase her Livalo. She reports having had her flu vaccine this year.  08/16/2017  [name] returns today for follow-up.  She seen specifically for oxygen testing.  We tested her room air saturation off of oxygen and it was noted to be 88% at rest.   We did not do ambulatory testing as she met criteria for oxygen therapy.  She is currently on an oxygen concentrator which we wish to renew.  She needs the oxygen concentrator as she is active and is mobile but requires continuous oxygen therapy -she is on 3 L continuous by nasal cannula.  12/09/2017  [name] returns today for follow-up.  She reports doing fairly well.  She is pleased that she finally got her oxygen concentrator.  Blood pressure is well-controlled today 122/72.  She denies chest pain or worsening shortness of breath.  Should her weight is been stable.  She had a recent lipid profile in December which showed LDL cholesterol of 81 and non-HDL cholesterol of 98.  She does have mild aortic valve stenosis which was assessed by echo last year.  PMHx:  Past Medical History:  Diagnosis Date  . Abnormal LFTs 08/02/2011  . Acute liver failure 06/19/2013  . Acute renal failure ([place]) 06/19/2013  . Acute respiratory failure ([place]) 06/19/2013  . Altered mental status 08/01/2011  . Angina   . Anxiety   . Anxiety state, unspecified 12/03/2013  . Aortic stenosis    mild  . Arthritis    "hands" (02/03/2017)  . Breast cancer of upper-outer quadrant of left female breast ([place]) 08/21/2013  . Breast cancer, right breast ([place]) dx'd 2008  . CAD (coronary artery disease) of artery bypass graft 11/06/2013  . CAD (coronary artery disease), MI R/O 08/01/2011   PCI in 2006 (bare metal stent, unknown artery) - [place]   . CHF,  acute diastolic, BNP 4k on admissio 08/01/2011  . Chronic bronchitis ([place])   . Chronic respiratory failure ([place]) 02/02/2012  . Compression fracture of L1 lumbar vertebra ([place]) 02/02/2012  . COPD (chronic obstructive pulmonary disease) ([facility])    oxygen-dependent 4LPM [place]  . Coronary artery disease 2006   2 stents w/previous MI  . Depressed   . Diastolic CHF ([place])   . Dyslipidemia   . Family history of adverse reaction to anesthesia    "daughter had c-section; missed twice w/epidural"  . GERD  (gastroesophageal reflux disease)   . Heart murmur   . History of blood transfusion    "w/my colon OR"  . 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 History of nuclear stress test 08/03/2011   attenuation at apex - no perfusion defects   . HTN (hypertension) 11/06/2013  . Hyperkalemia, on ACE prior to admission 11/11/2011  . Hypertension   . Hyponatremia 01/31/2012  . Migraine    "qod to q couple months since I was 21" (02/03/2017)  . Mild aortic stenosis 08/01/2011   AVA 1.69 cm2 (06/02/2011)   . Moderate to severe pulmonary hypertension ([place])   . NSTEMI (non-ST elevated myocardial infarction) ([place]) 2006  . NSVT (nonsustained ventricular tachycardia) (H[facility])    h/o  . On home oxygen therapy    "3L just at night; have it available prn duringtheday" (08/24/2017)  . Pneumonia    "alot of times" (02/03/2017)    Past Surgical History:  Procedure Laterality Date  . BREAST BIOPSY Right 2008  . BREAST LUMPECTOMY Right 2008   malignant  .  CATARACT EXTRACTION W/ INTRAOCULAR LENS  IMPLANT, BILATERAL Bilateral ~ 2013  . [place]; 1971; 1978  . CHOLECYSTECTOMY OPEN  2011  . COLON SURGERY    . COLOSTOMY  11/2007  . COLOSTOMY CLOSURE  07/2008  . CORONARY ANGIOPLASTY WITH STENT PLACEMENT  2006   "2 stents"  . ERCP N/A 09/05/2015   Procedure: ENDOSCOPIC RETROGRADE CHOLANGIOPANCREATOGRAPHY (ERCP);  Surgeon: Ladene Artist, MD;  Location: [facility];  Service: Endoscopy;  Laterality: N/A;  . PARTIAL COLECTOMY  2009   for obstruction: temporary ostomy, later reversed.   Marland Kitchen RIGHT HEART CATHETERIZATION N/A 09/05/2013   Procedure: RIGHT HEART CATH;  Surgeon: Jolaine Artist, MD;  Location: [facility] CATH LAB;  Service: Cardiovascular;  Laterality: N/A;  . TRANSTHORACIC ECHOCARDIOGRAM  11/12/2011   EF 54-27%, normal systolic function, grade 1 diastolic dysfunction; ventricular septal flattening (D-sign); mild AS; trace-mild MR; LA mildly dilated; RV mod dilated; RA mod dilated; severe pulm HTN;  elevated CVP    FAMHx:  Family History  Problem Relation Age of Onset  . Schizophrenia Sister   . Alzheimer's disease Mother   . Hyperlipidemia Brother   . Heart disease Sister   . Diabetes Sister   . Cancer Neg Hx   . Stroke Neg Hx   . COPD Neg Hx   . Depression Neg Hx   . Drug abuse Neg Hx   . Early death Neg Hx   . Hypertension Neg Hx   . Kidney disease Neg Hx     SOCHx:   reports that she has been smoking cigarettes.  She has a 5.20 pack-year smoking history. she has never used smokeless tobacco. She reports that she drinks about 0.6 oz of alcohol per week. She reports that she does not use drugs.  ALLERGIES:  Allergies  Allergen Reactions  . Ceftriaxone Anaphylaxis and Other (See Comments)    *ROCEPHIN*  "Blow up like a balloon"  . Hydroxyzine Shortness Of Breath and Other (See Comments)    Pt states med make her light headed, get sob sxs  . Doxycycline Nausea And Vomiting  . Lexapro [Escitalopram] Other (See Comments)    Pt states med make her dizzy    ROS: Pertinent items noted in HPI and remainder of comprehensive ROS otherwise negative.  HOME MEDS: Current Outpatient Medications  Medication Sig Dispense Refill  . albuterol (VENTOLIN HFA) 108 (90 Base) MCG/ACT inhaler INHALE 2 PUFFS INTO THE LUNGS EVERY 4 HOURS AS NEEDED (Patient taking differently: INHALE 2 PUFFS INTO THE LUNGS EVERY 4 HOURS AS NEEDED FOR SOB) 36 Inhaler 5  . ALPRAZolam (XANAX) 1 MG tablet TAKE 1 TABLET BY MOUTH TWICE A DAY AS NEEDED 60 tablet 2  . Artificial Tear Ointment (DRY EYES OP) Apply 2 drops to eye daily as needed (dry eyes).     Marland Kitchen aspirin 325 MG EC tablet Take 1 tablet (325 mg total) by mouth every morning.    . ATROVENT HFA 17 MCG/ACT inhaler USE 2 PUFFS INTO THE LUNGS EVERY 4 HOURS AS NEEDED (Patient taking differently: USE 2 PUFFS INTO THE LUNGS EVERY 4 HOURS AS NEEDED FOR SOB) 77.4 Inhaler 5  . butalbital-acetaminophen-caffeine (FIORICET, ESGIC) 50-325-40 MG tablet TAKE 1 TO 2  TABLETS BY MOUTH AS NEEDED FOR HEADACHE 60 tablet 5  . Calcium Carbonate (CALCIUM 600 PO) Take 1 tablet by mouth daily.     . cholecalciferol (VITAMIN D) 1000 units tablet Take 1,000 Units by mouth daily.    Marland Kitchen denosumab ([name]) 60  MG/ML SOLN injection Inject 60 mg into the skin every 6 (six) months. Administer in upper arm, thigh, or abdomen 1 each 0  . diclofenac sodium (VOLTAREN) 1 % GEL APPLY 4 G TOPICALLY DAILY AS NEEDED (APPLY TO HANDS). HAND PAIN  2  . fluticasone (FLONASE) 50 MCG/ACT nasal spray USE 2 SPRAYS INTO EACH NOSTRIL ONCE DAILY**REPEAT FOR 5 DAYS THEN STOP (Patient taking differently: USE 2 SPRAYS INTO EACH NOSTRIL ONCE DAILY AS NEEDED FOR ALLERGIES **REPEAT FOR 5 DAYS THEN STOP) 16 g 11  . furosemide (LASIX) 40 MG tablet TAKE 1 TABLET BY MOUTH TWICE A DAY 180 tablet 1  . ipratropium-albuterol (DUONEB) 0.5-2.5 (3) MG/3ML SOLN Take 3 mLs by nebulization every 4 (four) hours as needed. (Patient taking differently: Take 3 mLs by nebulization every 4 (four) hours as needed (SOB). ) 360 mL 0  . lactulose, encephalopathy, (CHRONULAC) 10 GM/15ML SOLN TAKE 30MLS BY MOUTH 3 TIMES A DAY AS NEEDED FOR CONSTIPATION 1892 mL 1  . levocetirizine (XYZAL) 5 MG tablet Take 1 tablet (5 mg total) by mouth every evening. 90 tablet 3  . LIVALO 2 MG TABS TAKE 1 TABLET BY MOUTH EVERY DAY 90 tablet 1  . montelukast (SINGULAIR) 10 MG tablet Take 1 tablet (10 mg total) by mouth at bedtime. 30 tablet 11  . nortriptyline (PAMELOR) 25 MG capsule Take 2 capsules (50 mg total) by mouth at bedtime. 180 capsule 1  . Omega 3 1200 MG CAPS Take 1,200 mg by mouth every morning.     . ondansetron (ZOFRAN) 8 MG tablet Take 1 tablet (8 mg total) by mouth every 8 (eight) hours as needed for nausea or vomiting. 30 tablet 2  . OXYGEN-HELIUM IN Inhale 3 L into the lungs See admin instructions. Uses when needed during the day, uses continuous throughout the night    . pantoprazole (PROTONIX) 40 MG tablet TAKE 1 TABLET BY MOUTH  ONCE A DAY 90 tablet 1  . potassium chloride SA (KLOR-CON M20) 20 MEQ tablet Take 1 tablet (20 mEq total) by mouth daily. 90 tablet 3  . SYMBICORT 160-4.5 MCG/ACT inhaler Inhale 2 puffs into the lungs 2 (two) times daily. 10.2 Inhaler 11  . vitamin C (ASCORBIC ACID) 500 MG tablet Take 500 mg by mouth daily.     No current facility-administered medications for this visit.     LABS/IMAGING: No results found for this or any previous visit (from the past 48 hour(s)). No results found.  VITALS: BP 122/72   Pulse 76   Ht '5\' 2"'$  (1.575 m)   Wt 123 lb (55.8 kg)   BMI 22.50 kg/m   EXAM: General appearance: alert and no distress Neck: no carotid bruit, no JVD and thyroid not enlarged, symmetric, no tenderness/mass/nodules Lungs: diminished breath sounds bilaterally Heart: regular rate and rhythm, S1, S2 normal and systolic murmur: early systolic 3/6, crescendo at 2nd right intercostal space Abdomen: soft, non-tender; bowel sounds normal; no masses,  no organomegaly Extremities: extremities normal, atraumatic, no cyanosis or edema Pulses: 2+ and symmetric Skin: Skin color, texture, turgor normal. No rashes or lesions Neurologic: Grossly normal Psych: Pleasant  EKG: Normal sinus rhythm 76-personally reviewed  ASSESSMENT: 1. Severe COPD with moderate pulmonary hypertension, on chronic oxygen (disabled d/t this) 2. Mild aortic stenosis 3. Low risk nuclear stress testing in 2012 4. Dyslipidemia - on livalo   5. History of CAD with prior remote PCI  PLAN: 1.   Ms. Hopkins seems to be doing well and her lung  disease is stable.  She is on oxygen which is working well for her.  She has mild aortic stenosis which is clinically unchanged on exam.  She has had no further chest pain.  She has dyslipidemia but has tolerated Livalo.  LDL is 81, reduced from 97 about a year ago.  She has made some dietary changes which have been helpful.  We will continue her current therapy.  Follow-up with me in  6 months or sooner as necessary.  Pixie Casino, MD, [facility], [place] Director of the [facility]  Card[facility]ovascular [facility] Diplomate of the American Board of Clinical Lipidology Attending Cardiologist  Direct Dial: [phone]  Fax: [phone]  Website:  [email or web address] 12/09/2017, 12:37 PM

## 2017-12-15 ENCOUNTER — Other Ambulatory Visit: Payer: Self-pay | Admitting: Internal Medicine

## 2017-12-15 DIAGNOSIS — J301 Allergic rhinitis due to pollen: Secondary | ICD-10-CM

## 2017-12-18 ENCOUNTER — Other Ambulatory Visit: Payer: Self-pay | Admitting: Internal Medicine

## 2017-12-22 ENCOUNTER — Ambulatory Visit: Payer: Medicare Other | Admitting: Internal Medicine

## 2017-12-27 ENCOUNTER — Encounter: Payer: Self-pay | Admitting: Internal Medicine

## 2017-12-27 ENCOUNTER — Ambulatory Visit (INDEPENDENT_AMBULATORY_CARE_PROVIDER_SITE_OTHER): Payer: Medicare Other | Admitting: Internal Medicine

## 2017-12-27 VITALS — BP 90/60 | HR 62 | Temp 97.8°F | Resp 12 | Ht 62.0 in | Wt 124.0 lb

## 2017-12-27 DIAGNOSIS — I10 Essential (primary) hypertension: Secondary | ICD-10-CM

## 2017-12-27 DIAGNOSIS — J449 Chronic obstructive pulmonary disease, unspecified: Secondary | ICD-10-CM

## 2017-12-27 DIAGNOSIS — I2510 Atherosclerotic heart disease of native coronary artery without angina pectoris: Secondary | ICD-10-CM

## 2017-12-27 DIAGNOSIS — M19049 Primary osteoarthritis, unspecified hand: Secondary | ICD-10-CM

## 2017-12-27 MED ORDER — DICLOFENAC SODIUM 1 % TD GEL: TRANSDERMAL | 5 refills | Status: DC

## 2017-12-27 NOTE — Progress Notes (Signed)
Subjective:  Patient ID: [name], female    DOB: [birth year]  Age: 72 y.o. MRN: [id number]  CC: Hypertension and Osteoarthritis   HPI TAILER VOLKERT presents for f/up - She complains of pain in both hands and wants a refill of her topical anti-inflammatory.  Otherwise, she  feels like she is at her baseline or may be a little better than usual.  She has had no recent episodes of worsening shortness of breath and she denies cough, chest pain, hemoptysis, wheezing, fever, or chills.  Outpatient Medications Prior to Visit  Medication Sig Dispense Refill  . albuterol (VENTOLIN HFA) 108 (90 Base) MCG/ACT inhaler INHALE 2 PUFFS INTO THE LUNGS EVERY 4 HOURS AS NEEDED (Patient taking differently: INHALE 2 PUFFS INTO THE LUNGS EVERY 4 HOURS AS NEEDED FOR SOB) 36 Inhaler 5  . ALPRAZolam (XANAX) 1 MG tablet TAKE 1 TABLET BY MOUTH TWICE A DAY AS NEEDED 60 tablet 2  . Artificial Tear Ointment (DRY EYES OP) Apply 2 drops to eye daily as needed (dry eyes).     Marland Kitchen aspirin 325 MG EC tablet Take 1 tablet (325 mg total) by mouth every morning.    . ATROVENT HFA 17 MCG/ACT inhaler USE 2 PUFFS INTO THE LUNGS EVERY 4 HOURS AS NEEDED (Patient taking differently: USE 2 PUFFS INTO THE LUNGS EVERY 4 HOURS AS NEEDED FOR SOB) 77.4 Inhaler 5  . butalbital-acetaminophen-caffeine (FIORICET, ESGIC) 50-325-40 MG tablet TAKE 1 TO 2 TABLETS BY MOUTH AS NEEDED FOR HEADACHE 60 tablet 5  . Calcium Carbonate (CALCIUM 600 PO) Take 1 tablet by mouth daily.     . cholecalciferol (VITAMIN D) 1000 units tablet Take 1,000 Units by mouth daily.    Marland Kitchen denosumab ([name]) 60 MG/ML SOLN injection Inject 60 mg into the skin every 6 (six) months. Administer in upper arm, thigh, or abdomen 1 each 0  . fluticasone (FLONASE) 50 MCG/ACT nasal spray USE 2 SPRAYS INTO EACH NOSTRIL ONCE DAILY**REPEAT FOR 5 DAYS THEN STOP (Patient taking differently: USE 2 SPRAYS INTO EACH NOSTRIL ONCE DAILY AS NEEDED FOR ALLERGIES **REPEAT FOR 5 DAYS THEN STOP)  16 g 11  . furosemide (LASIX) 40 MG tablet TAKE 1 TABLET BY MOUTH TWICE A DAY 180 tablet 1  . ipratropium-albuterol (DUONEB) 0.5-2.5 (3) MG/3ML SOLN Take 3 mLs by nebulization every 4 (four) hours as needed. (Patient taking differently: Take 3 mLs by nebulization every 4 (four) hours as needed (SOB). ) 360 mL 0  . lactulose, encephalopathy, (CHRONULAC) 10 GM/15ML SOLN TAKE 30MLS BY MOUTH 3 TIMES A DAY AS NEEDED FOR CONSTIPATION 1892 mL 1  . levocetirizine (XYZAL) 5 MG tablet Take 1 tablet (5 mg total) by mouth every evening. 90 tablet 3  . LIVALO 2 MG TABS TAKE 1 TABLET BY MOUTH EVERY DAY 90 tablet 1  . montelukast (SINGULAIR) 10 MG tablet TAKE 1 TABLET BY MOUTH EVERYDAY AT BEDTIME 90 tablet 1  . nortriptyline (PAMELOR) 25 MG capsule Take 2 capsules (50 mg total) by mouth at bedtime. 180 capsule 1  . Omega 3 1200 MG CAPS Take 1,200 mg by mouth every morning.     . OXYGEN-HELIUM IN Inhale 3 L into the lungs See admin instructions. Uses when needed during the day, uses continuous throughout the night    . pantoprazole (PROTONIX) 40 MG tablet TAKE 1 TABLET BY MOUTH ONCE A DAY 90 tablet 1  . potassium chloride SA (KLOR-CON M20) 20 MEQ tablet Take 1 tablet (20 mEq total) by mouth  daily. 90 tablet 3  . SYMBICORT 160-4.5 MCG/ACT inhaler Inhale 2 puffs into the lungs 2 (two) times daily. 10.2 Inhaler 11  . vitamin C (ASCORBIC ACID) 500 MG tablet Take 500 mg by mouth daily.    . diclofenac sodium (VOLTAREN) 1 % GEL APPLY 4 G TOPICALLY DAILY AS NEEDED (APPLY TO HANDS). HAND PAIN  2  . ondansetron (ZOFRAN) 8 MG tablet Take 1 tablet (8 mg total) by mouth every 8 (eight) hours as needed for nausea or vomiting. 30 tablet 2   No facility-administered medications prior to visit.     ROS Review of Systems  Constitutional: Negative for appetite change, diaphoresis, fatigue and unexpected weight change.  HENT: Negative.  Negative for facial swelling, sinus pressure and trouble swallowing.   Eyes: Negative for  visual disturbance.  Respiratory: Positive for shortness of breath. Negative for cough, chest tightness and wheezing.   Cardiovascular: Negative for chest pain, palpitations and leg swelling.  Gastrointestinal: Negative for abdominal pain, constipation, diarrhea, nausea and vomiting.  Genitourinary: Negative.  Negative for difficulty urinating.  Musculoskeletal: Positive for arthralgias. Negative for myalgias and neck pain.       She has bilateral hand pain  Skin: Negative.  Negative for color change, pallor and rash.  Neurological: Negative.  Negative for dizziness, weakness and light-headedness.  Hematological: Negative for adenopathy. Does not bruise/bleed easily.  Psychiatric/Behavioral: Negative.     Objective:  BP 90/60 (BP Location: Left Arm, Patient Position: Sitting, Cuff Size: Normal)   Pulse 62   Temp 97.8 F (36.6 C) (Oral)   Resp 12   Ht 5\' 2"  (1.575 m)   Wt 124 lb (56.2 kg)   BMI 22.68 kg/m   BP Readings from Last 3 Encounters:  12/27/17 90/60  12/09/17 122/72  09/01/17 104/70    Wt Readings from Last 3 Encounters:  12/27/17 124 lb (56.2 kg)  12/09/17 123 lb (55.8 kg)  09/01/17 123 lb 8 oz (56 kg)    Physical Exam  Constitutional: She is oriented to person, place, and time. No distress.  HENT:  Mouth/Throat: No oropharyngeal exudate.  Eyes: Conjunctivae are normal. Right eye exhibits no discharge. Left eye exhibits no discharge. No scleral icterus.  Neck: Normal range of motion. Neck supple. No JVD present. No thyromegaly present.  Cardiovascular: Normal rate, regular rhythm, S1 normal and S2 normal. Exam reveals no gallop, no S3, no S4 and no friction rub.  Murmur heard.  Systolic murmur is present with a grade of 2/6.  No diastolic murmur is present. Pulmonary/Chest: Effort normal. No accessory muscle usage. Tachypnea noted. No respiratory distress. She has no decreased breath sounds. She has wheezes in the right middle field and the left middle field.  She has rhonchi in the right middle field and the left middle field. She has no rales.  Abdominal: Soft. Bowel sounds are normal. She exhibits no distension and no mass. There is no tenderness. There is no guarding.  Musculoskeletal: Normal range of motion. She exhibits no edema or deformity.  Lymphadenopathy:    She has no cervical adenopathy.  Neurological: She is alert and oriented to person, place, and time.  Skin: Skin is warm and dry. No rash noted. She is not diaphoretic. No erythema. No pallor.    Lab Results  Component Value Date   WBC 10.3 09/01/2017   HGB 13.0 09/01/2017   HCT 39.3 09/01/2017   PLT 302.0 09/01/2017   GLUCOSE 102 (H) 08/27/2017   CHOL 174 09/01/2017  TRIG 82.0 09/01/2017   HDL 76.40 09/01/2017   LDLCALC 81 09/01/2017   ALT 23 08/27/2017   AST 27 08/27/2017   NA 139 08/27/2017   K 3.9 08/27/2017   CL 104 08/27/2017   CREATININE 0.77 08/27/2017   BUN 12 08/27/2017   CO2 27 08/27/2017   TSH 1.17 12/21/2016   INR 0.87 09/05/2013   HGBA1C 5.3 10/16/2015    Dg Chest 2 View  Result Date: 08/24/2017 CLINICAL DATA:  Shortness of breath for 3 days. EXAM: CHEST  2 VIEW COMPARISON:  Radiograph 02/24/2017, CT 01/01/2015 FINDINGS: Similar cardiomegaly. Aortic atherosclerosis. Pulmonary edema with minimal progression. Right middle lobe and lingular scarring, as seen on CT. No consolidation. Possible trace right pleural effusion. Right apical pleuroparenchymal scarring. No pneumothorax or acute osseous abnormality. IMPRESSION: Pulmonary edema, with mild increase from prior and possible small right pleural effusion. Stable cardiomegaly. Electronically Signed   By: Jeb Levering M.D.   On: 08/24/2017 05:18    Assessment & Plan:   Jahmia was seen today for hypertension and osteoarthritis.  Diagnoses and all orders for this visit:  Essential hypertension- Her blood pressure is well controlled.  Primary localized osteoarthrosis of hand, unspecified  laterality -     diclofenac sodium (VOLTAREN) 1 % GEL; APPLY 4 G TOPICALLY DAILY AS NEEDED (APPLY TO HANDS). HAND PAIN  Chronic obstructive pulmonary disease, unspecified COPD type ([place])- She is well compensated.  Will continue the current combination of continuous oxygen, Combivent inhaler, and LABA/ICS.   I have discontinued [name] "Nyilah Denn"'s ondansetron. I am also having her maintain her OXYGEN-HELIUM IN, Omega 3, aspirin, cholecalciferol, Artificial Tear Ointment (DRY EYES OP), vitamin C, fluticasone, levocetirizine, ipratropium-albuterol, butalbital-acetaminophen-caffeine, ATROVENT HFA, albuterol, LIVALO, potassium chloride SA, Calcium Carbonate (CALCIUM 600 PO), furosemide, nortriptyline, SYMBICORT, denosumab, pantoprazole, ALPRAZolam, montelukast, lactulose (encephalopathy), and diclofenac sodium.  Meds ordered this encounter  Medications  . diclofenac sodium (VOLTAREN) 1 % GEL    Sig: APPLY 4 G TOPICALLY DAILY AS NEEDED (APPLY TO HANDS). HAND PAIN    Dispense:  100 g    Refill:  5     Follow-up: Return in about 6 months (around 06/28/2018).  Scarlette Calico, MD

## 2017-12-27 NOTE — Patient Instructions (Signed)

## 2017-12-30 ENCOUNTER — Telehealth: Payer: Self-pay | Admitting: Internal Medicine

## 2017-12-30 NOTE — Telephone Encounter (Signed)
Copied from [place]. Topic: Quick Communication - Rx Refill/Question >> Dec 30, 2017 12:10 PM Waylan Rocher, Lumin L wrote: Medication: diclofenac sodium (VOLTAREN) 1 % GEL  Has the patient contacted their pharmacy? Yes.   (Agent: If no, request that the patient contact the pharmacy for the refill.) Preferred Pharmacy (with phone number or street name): [id number] [place], [place] - [address] [place] [address] Phone: [phone] Fax: [phone] Agent: Please be advised that RX refills may take up to 3 business days. We ask that you follow-up with your pharmacy.  Patient called because prior authorization is required for the script sent 04/02 after her appointment. She wanted to know if it could be completed today.

## 2017-12-30 NOTE — Telephone Encounter (Signed)
This medication was sent to the pharmacy on 12/27/2017

## 2017-12-30 NOTE — Telephone Encounter (Signed)
LVM to inform patient.  

## 2018-01-03 ENCOUNTER — Encounter: Payer: Self-pay | Admitting: Internal Medicine

## 2018-01-04 ENCOUNTER — Other Ambulatory Visit: Payer: Self-pay | Admitting: Internal Medicine

## 2018-01-09 ENCOUNTER — Other Ambulatory Visit: Payer: Self-pay | Admitting: Internal Medicine

## 2018-01-10 ENCOUNTER — Telehealth: Payer: Self-pay | Admitting: Internal Medicine

## 2018-01-10 ENCOUNTER — Other Ambulatory Visit: Payer: Self-pay | Admitting: Internal Medicine

## 2018-01-10 NOTE — Telephone Encounter (Signed)
Copied from [place] [phone]. Topic: Quick Communication - See Telephone Encounter >> Jan 10, 2018  2:53 PM Hewitt Shorts wrote: CRM for notification. See Telephone encounter for: 01/10/18. Pt is calling to say that her valtaren is needing a prior authorization  Best number [phone]  [facility] [address]

## 2018-01-11 NOTE — Telephone Encounter (Signed)
PA started for voltaren gel (Key: [id number])

## 2018-01-23 ENCOUNTER — Encounter: Payer: Self-pay | Admitting: Internal Medicine

## 2018-01-23 NOTE — Progress Notes (Addendum)
Subjective:   [name] is a 72 y.o. female who presents for Medicare Annual (Subsequent) preventive examination.  Review of Systems:  No ROS.  Medicare Wellness Visit. Additional risk factors are reflected in the social history.  Cardiac Risk Factors include: advanced age (>73men, >48 women);dyslipidemia Sleep patterns: feels rested on waking, gets up 1 times nightly to void and sleeps 8-9 hours nightly.    Home Safety/Smoke Alarms: Feels safe in home. Smoke alarms in place.  Living environment; residence and Firearm Safety: 2-story house, no firearms. Lives with family, no needs for DME, good support system Seat Belt Safety/Bike Helmet: Wears seat belt.     Objective:     Vitals: BP 104/62   Pulse 79   Resp 18   Ht 5\' 2"  (1.575 m)   Wt 121 lb (54.9 kg)   SpO2 98% Comment: 3L [place]  BMI 22.13 kg/m   Body mass index is 22.13 kg/m.  Advanced Directives 01/24/2018 08/24/2017 02/03/2017 02/03/2017 12/21/2016 02/12/2016 10/17/2015  Does Patient Have a Medical Advance Directive? Yes No No No No No Yes  Type of Paramedic of [place];Living will - - - - - Press photographer;Living will  Does patient want to make changes to medical advance directive? - - - - - - No - Patient declined  Copy of [place] in Chart? - - - - - - Yes  Would patient like information on creating a medical advance directive? - No - Patient declined No - Patient declined - Yes (ED - Information included in AVS) - -  Pre-existing out of facility DNR order (yellow form or pink MOST form) - - - - - - -    Tobacco Social History   Tobacco Use  Smoking Status Current Some Day Smoker  . Packs/day: 0.10  . Years: 52.00  . Pack years: 5.20  . Types: Cigarettes  Smokeless Tobacco Never Used     Ready to quit: Not Answered Counseling given: Not Answered  Past Medical History:  Diagnosis Date  . Abnormal LFTs 08/02/2011  . Acute liver failure 06/19/2013    . Acute renal failure ([place]) 06/19/2013  . Acute respiratory failure ([place]) 06/19/2013  . Altered mental status 08/01/2011  . Angina   . Anxiety   . Anxiety state, unspecified 12/03/2013  . Aortic stenosis    mild  . Arthritis    "hands" (02/03/2017)  . Breast cancer of upper-outer quadrant of left female breast ([place]) 08/21/2013  . Breast cancer, right breast ([place]) dx'd 2008  . CAD (coronary artery disease) of artery bypass graft 11/06/2013  . CAD (coronary artery disease), MI R/O 08/01/2011   PCI in 2006 (bare metal stent, unknown artery) - [place]   . CHF,  acute diastolic, BNP 4k on admissio 08/01/2011  . Chronic bronchitis ([place])   . Chronic respiratory failure ([place]) 02/02/2012  . Compression fracture of L1 lumbar vertebra ([place]) 02/02/2012  . COPD (chronic obstructive pulmonary disease) ([facility])    oxygen-dependent 4LPM [place]  . Coronary artery disease 2006   2 stents w/previous MI  . Depressed   . Diastolic CHF ([place])   . Dyslipidemia   . Family history of adverse reaction to anesthesia    "daughter had c-section; missed twice w/epidural"  . GERD (gastroesophageal reflux disease)   . Heart murmur   . History of blood transfusion    "w/my colon OR"  . 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 History of nuclear  stress test 08/03/2011   attenuation at apex - no perfusion defects   . HTN (hypertension) 11/06/2013  . Hyperkalemia, on ACE prior to admission 11/11/2011  . Hypertension   . Hyponatremia 01/31/2012  . Migraine    "qod to q couple months since I was 21" (02/03/2017)  . Mild aortic stenosis 08/01/2011   AVA 1.69 cm2 (06/02/2011)   . Moderate to severe pulmonary hypertension ([place])   . NSTEMI (non-ST elevated myocardial infarction) ([place]) 2006  . NSVT (nonsustained ventricular tachycardia) ([facility])    h/o  . On home oxygen therapy    "3L just at night; have it available prn duringtheday" (08/24/2017)  . Pneumonia    "alot of times" (02/03/2017)   Past Surgical History:  Procedure Laterality Date   . BREAST BIOPSY Right 2008  . BREAST LUMPECTOMY Right 2008   malignant  . CATARACT EXTRACTION W/ INTRAOCULAR LENS  IMPLANT, BILATERAL Bilateral ~ 2013  . [place]; 1971; 1978  . CHOLECYSTECTOMY OPEN  2011  . COLON SURGERY    . COLOSTOMY  11/2007  . COLOSTOMY CLOSURE  07/2008  . CORONARY ANGIOPLASTY WITH STENT PLACEMENT  2006   "2 stents"  . ERCP N/A 09/05/2015   Procedure: ENDOSCOPIC RETROGRADE CHOLANGIOPANCREATOGRAPHY (ERCP);  Surgeon: Ladene Artist, MD;  Location: [facility];  Service: Endoscopy;  Laterality: N/A;  . PARTIAL COLECTOMY  2009   for obstruction: temporary ostomy, later reversed.   Marland Kitchen RIGHT HEART CATHETERIZATION N/A 09/05/2013   Procedure: RIGHT HEART CATH;  Surgeon: Jolaine Artist, MD;  Location[facility];  Service: Cardiovascular;  Laterality: N/A;  . TRANSTHORACIC ECHOCARDIOGRAM  11/12/2011   EF 53-61%, normal systolic function, grade 1 diastolic dysfunction; ventricular septal flattening (D-sign); mild AS; trace-mild MR; LA mildly dilated; RV mod dilated; RA mod dilated; severe pulm HTN; elevated CVP   Family History  Problem Relation Age of Onset  . Schizophrenia Sister   . Alzheimer's disease Mother   . Hyperlipidemia Brother   . Heart disease Sister   . Diabetes Sister   . Cancer Neg Hx   . Stroke Neg Hx   . COPD Neg Hx   . Depression Neg Hx   . Drug abuse Neg Hx   . Early death Neg Hx   . Hypertension Neg Hx   . Kidney disease Neg Hx    Social History   Socioeconomic History  . Marital status: Widowed    Spouse name: Not on file  . Number of children: 3  . Years of education: 40  . Highest education level: Not on file  Occupational History  . Not on file  Social Needs  . Financial resource strain: Not very hard  . Food insecurity:    Worry: Never true    Inability: Never true  . Transportation needs:    Medical: No    Non-medical: No  Tobacco Use  . Smoking status: Current Some Day Smoker    Packs/day: 0.10     Years: 52.00    Pack years: 5.20    Types: Cigarettes  . Smokeless tobacco: Never Used  Substance and Sexual Activity  . Alcohol use: Yes    Alcohol/week: 0.6 oz    Types: 1 Glasses of [place] per week  . Drug use: No  . Sexual activity: Not Currently  Lifestyle  . Physical activity:    Days per week: 0 days    Minutes per session: 0 min  . Stress: Not at all  Relationships  .  Social connections:    Talks on phone: More than three times a week    Gets together: More than three times a week    Attends religious service: 1 to 4 times per year    Active member of club or organization: Yes    Attends meetings of clubs or organizations: 1 to 4 times per year    Relationship status: Widowed  Other Topics Concern  . Not on file  Social History Narrative  . Not on file    Outpatient Encounter Medications as of 01/24/2018  Medication Sig  . albuterol (VENTOLIN HFA) 108 (90 Base) MCG/ACT inhaler INHALE 2 PUFFS INTO THE LUNGS EVERY 4 HOURS AS NEEDED (Patient taking differently: INHALE 2 PUFFS INTO THE LUNGS EVERY 4 HOURS AS NEEDED FOR SOB)  . ALPRAZolam (XANAX) 1 MG tablet TAKE 1 TABLET BY MOUTH TWICE A DAY AS NEEDED  . Artificial Tear Ointment (DRY EYES OP) Apply 2 drops to eye daily as needed (dry eyes).   Marland Kitchen aspirin 325 MG EC tablet Take 1 tablet (325 mg total) by mouth every morning.  . ATROVENT HFA 17 MCG/ACT inhaler USE 2 PUFFS INTO THE LUNGS EVERY 4 HOURS AS NEEDED (Patient taking differently: USE 2 PUFFS INTO THE LUNGS EVERY 4 HOURS AS NEEDED FOR SOB)  . butalbital-acetaminophen-caffeine (FIORICET, ESGIC) 50-325-40 MG tablet TAKE 1 TO 2 TABLETS BY MOUTH AS NEEDED FOR HEADACHE  . Calcium Carbonate (CALCIUM 600 PO) Take 1 tablet by mouth daily.   . cholecalciferol (VITAMIN D) 1000 units tablet Take 1,000 Units by mouth daily.  Marland Kitchen denosumab ([name]) 60 MG/ML SOLN injection Inject 60 mg into the skin every 6 (six) months. Administer in upper arm, thigh, or abdomen  . diclofenac sodium  (VOLTAREN) 1 % GEL APPLY 4 G TOPICALLY DAILY AS NEEDED (APPLY TO HANDS). HAND PAIN  . fluticasone (FLONASE) 50 MCG/ACT nasal spray USE 2 SPRAYS INTO EACH NOSTRIL ONCE DAILY**REPEAT FOR 5 DAYS THEN STOP (Patient taking differently: USE 2 SPRAYS INTO EACH NOSTRIL ONCE DAILY AS NEEDED FOR ALLERGIES **REPEAT FOR 5 DAYS THEN STOP)  . furosemide (LASIX) 40 MG tablet TAKE 1 TABLET BY MOUTH TWICE A DAY  . ipratropium-albuterol (DUONEB) 0.5-2.5 (3) MG/3ML SOLN Take 3 mLs by nebulization every 4 (four) hours as needed. (Patient taking differently: Take 3 mLs by nebulization every 4 (four) hours as needed (SOB). )  . lactulose, encephalopathy, (CHRONULAC) 10 GM/15ML SOLN TAKE 30MLS BY MOUTH 3 TIMES A DAY AS NEEDED FOR CONSTIPATION  . levocetirizine (XYZAL) 5 MG tablet Take 1 tablet (5 mg total) by mouth every evening.  Marland Kitchen LIVALO 2 MG TABS TAKE 1 TABLET BY MOUTH EVERY DAY  . montelukast (SINGULAIR) 10 MG tablet TAKE 1 TABLET BY MOUTH EVERYDAY AT BEDTIME  . nortriptyline (PAMELOR) 25 MG capsule Take 2 capsules (50 mg total) by mouth at bedtime.  . Omega 3 1200 MG CAPS Take 1,200 mg by mouth every morning.   . OXYGEN-HELIUM IN Inhale 3 L into the lungs See admin instructions. Uses when needed during the day, uses continuous throughout the night  . pantoprazole (PROTONIX) 40 MG tablet TAKE 1 TABLET BY MOUTH ONCE A DAY  . potassium chloride SA (KLOR-CON M20) 20 MEQ tablet Take 1 tablet (20 mEq total) by mouth daily.  . SYMBICORT 160-4.5 MCG/ACT inhaler Inhale 2 puffs into the lungs 2 (two) times daily.  . vitamin C (ASCORBIC ACID) 500 MG tablet Take 500 mg by mouth daily.  . [DISCONTINUED] butalbital-acetaminophen-caffeine (FIORICET, [place]) [phone]  MG tablet TAKE 1 TO 2 TABLETS BY MOUTH AS NEEDED FOR HEADACHE   No facility-administered encounter medications on file as of 01/24/2018.     Activities of Daily Living In your present state of health, do you have any difficulty performing the following activities:  01/24/2018 08/24/2017  Hearing? N N  Vision? N N  Difficulty concentrating or making decisions? N N  Walking or climbing stairs? N Y  Comment - "get SOB"  Dressing or bathing? N N  Doing errands, shopping? N N  Preparing Food and eating ? N -  Using the Toilet? N -  In the past six months, have you accidently leaked urine? N -  Do you have problems with loss of bowel control? N -  Managing your Medications? N -  Managing your Finances? N -  Housekeeping or managing your Housekeeping? N -  Some recent data might be hidden    Patient Care Team: [name], MD as PCP - General (Internal Medicine) Debara Pickett Nadean Corwin, MD as Consulting Physician (Cardiology) Juanito Doom, MD as Consulting Physician (Pulmonary Disease)    Assessment:   This is a routine wellness examination for [name]. Physical assessment deferred to PCP.   Exercise Activities and Dietary recommendations Current Exercise Habits: The patient does not participate in regular exercise at present, Exercise limited by: respiratory conditions(s)  Diet (meal preparation, eat out, water intake, caffeinated beverages, dairy products, fruits and vegetables): in general, a "healthy" diet  , well balanced, eats a variety of fruits and vegetables daily, limits salt, fat/cholesterol, sugar,carbohydrates,caffeine, drinks 6-8 glasses of water daily.  Goals    . Patient Stated     Continue to enjoy doing things I  like gardening, drawing, relaxing and being spiritual my way.       Fall Risk Fall Risk  01/24/2018 12/27/2017 12/21/2016 10/17/2015 10/16/2015  Falls in the past year? No No No No No   Depression Screen PHQ 2/9 Scores 01/24/2018 12/27/2017 12/21/2016 10/17/2015  PHQ - 2 Score 0 0 0 0  PHQ- 9 Score 1 1 - -     Cognitive Function       Ad8 score reviewed for issues:  Issues making decisions: no  Less interest in hobbies / activities: no  Repeats questions, stories (family complaining): no  Trouble using  ordinary gadgets (microwave, computer, phone):no  Forgets the month or year: no  Mismanaging finances: no  Remembering appts: no  Daily problems with thinking and/or memory: no Ad8 score is= 0  Immunization History  Administered Date(s) Administered  . Influenza, High Dose Seasonal PF 06/19/2015, 06/05/2017  . Influenza,inj,Quad PF,6+ Mos 06/22/2013, 07/09/2014  . Influenza-Unspecified 07/12/2016  . Pneumococcal Conjugate-13 10/03/2013  . Pneumococcal Polysaccharide-23 04/10/2014  . Tdap 03/04/2014   Screening Tests Health Maintenance  Topic Date Due  . INFLUENZA VACCINE  04/27/2018  . COLONOSCOPY  05/15/2018  . MAMMOGRAM  02/23/2019  . TETANUS/TDAP  03/04/2024  . DEXA SCAN  Completed  . Hepatitis C Screening  Completed  . PNA vac Low Risk Adult  Completed      Plan:     Continue to eat heart healthy diet (full of fruits, vegetables, whole grains, lean protein, water--limit salt, fat, and sugar intake) and increase physical activity as tolerated.  Continue doing brain stimulating activities (puzzles, reading, adult coloring books, staying active) to keep memory sharp.    I have personally reviewed and noted the following in the patient's chart:   . Medical and social history .  Use of alcohol, tobacco or illicit drugs  . Current medications and supplements . Functional ability and status . Nutritional status . Physical activity . Advanced directives . List of other physicians . Vitals . Screenings to include cognitive, depression, and falls . Referrals and appointments  In addition, I have reviewed and discussed with patient certain preventive protocols, quality metrics, and best practice recommendations. A written personalized care plan for preventive services as well as general preventive health recommendations were provided to patient.     Michiel Cowboy, RN  01/24/2018   Medical screening examination/treatment/procedure(s) were performed by non-physician  practitioner and as supervising physician I was immediately available for consultation/collaboration. I agree with above. Scarlette Calico, MD

## 2018-01-24 ENCOUNTER — Ambulatory Visit (INDEPENDENT_AMBULATORY_CARE_PROVIDER_SITE_OTHER): Payer: Medicare Other | Admitting: *Deleted

## 2018-01-24 ENCOUNTER — Telehealth: Payer: Self-pay | Admitting: Internal Medicine

## 2018-01-24 VITALS — BP 104/62 | HR 79 | Resp 18 | Ht 62.0 in | Wt 121.0 lb

## 2018-01-24 DIAGNOSIS — Z0000 Encounter for general adult medical examination without abnormal findings: Secondary | ICD-10-CM | POA: Diagnosis not present

## 2018-01-24 NOTE — Telephone Encounter (Signed)
Patient would like to schedule prolia for June.

## 2018-01-24 NOTE — Patient Instructions (Addendum)
[email or web address] or down load app on smart phone  Aunt Berenice Primas website lists multiple social resources for individuals such as: food, health, money, house hold goods, transit, medical supplies, job training and legal services.  Continue to eat heart healthy diet (full of fruits, vegetables, whole grains, lean protein, water--limit salt, fat, and sugar intake) and increase physical activity as tolerated.  Continue doing brain stimulating activities (puzzles, reading, adult coloring books, staying active) to keep memory sharp.    Ms. [name] , Thank you for taking time to come for your Medicare Wellness Visit. I appreciate your ongoing commitment to your health goals. Please review the following plan we discussed and let me know if I can assist you in the future.   These are the goals we discussed: Goals    . Patient Stated     Continue to enjoy doing things I  like gardening, drawing, relaxing and being spiritual my way.       This is a list of the screening recommended for you and due dates:  Health Maintenance  Topic Date Due  . Flu Shot  04/27/2018  . Colon Cancer Screening  05/15/2018  . Mammogram  02/23/2019  . Tetanus Vaccine  03/04/2024  . DEXA scan (bone density measurement)  Completed  .  Hepatitis C: One time screening is recommended by Center for Disease Control  (CDC) for  adults born from [birth year] through 1965.   Completed  . Pneumonia vaccines  Completed

## 2018-01-24 NOTE — Telephone Encounter (Signed)
Left message advising patient that she will be due for her next prolia injection either on/after June 25th, 2019---I will reverify her insurance closer to that date and call her back with summary of benefits, and to schedule appt time---any other questions, patient can talk with Rockelle Heuerman,RN at [facility] office

## 2018-02-01 ENCOUNTER — Other Ambulatory Visit: Payer: Self-pay | Admitting: Internal Medicine

## 2018-02-10 ENCOUNTER — Other Ambulatory Visit: Payer: Self-pay | Admitting: Internal Medicine

## 2018-02-10 DIAGNOSIS — F409 Phobic anxiety disorder, unspecified: Secondary | ICD-10-CM

## 2018-02-10 DIAGNOSIS — F5105 Insomnia due to other mental disorder: Secondary | ICD-10-CM

## 2018-02-15 ENCOUNTER — Other Ambulatory Visit: Payer: Self-pay | Admitting: Internal Medicine

## 2018-02-15 DIAGNOSIS — E785 Hyperlipidemia, unspecified: Secondary | ICD-10-CM

## 2018-02-22 ENCOUNTER — Telehealth: Payer: Self-pay | Admitting: Internal Medicine

## 2018-02-22 ENCOUNTER — Encounter: Payer: Self-pay | Admitting: Internal Medicine

## 2018-02-22 NOTE — Telephone Encounter (Addendum)
Left message for patient regarding her [name] injection.  Insurance was verified and summary of benefits states that patient would be responsible for a $250 copay to get injection from office. If it is sent to the pharmacy and pharmacy benefits are used there would be no copay. She needs to be aware that this may put her in the donut hole.  Patient is due on or after June 25th. Left message for patient to call back to schedule.

## 2018-02-28 ENCOUNTER — Telehealth: Payer: Self-pay

## 2018-02-28 NOTE — Telephone Encounter (Signed)
Left message advising patient that we have recd prolia injection from [facility]---insurance has covered cost of prolia using her pharmacy benefit---patient can get this injection either on/after June 25th at a nurse visit---prolia med has been labeled with patient name and placed in refrig---pt asked to call back to schedule nurse visit---when we document this injection, it will need to be documented "patient supplied med" and patient has no copay to come to our office---see or talk with tamara,RN at [facility] office if any further questions

## 2018-03-02 ENCOUNTER — Encounter: Payer: Self-pay | Admitting: Internal Medicine

## 2018-03-05 ENCOUNTER — Encounter: Payer: Self-pay | Admitting: Internal Medicine

## 2018-03-05 ENCOUNTER — Other Ambulatory Visit: Payer: Self-pay | Admitting: Internal Medicine

## 2018-03-12 ENCOUNTER — Encounter: Payer: Self-pay | Admitting: Internal Medicine

## 2018-03-13 ENCOUNTER — Encounter: Payer: Self-pay | Admitting: Internal Medicine

## 2018-03-13 NOTE — Telephone Encounter (Signed)
Appointment scheduled 04/04/18.

## 2018-03-13 NOTE — Telephone Encounter (Signed)
° °  Pt did not mean

## 2018-03-26 ENCOUNTER — Other Ambulatory Visit: Payer: Self-pay | Admitting: Internal Medicine

## 2018-03-26 DIAGNOSIS — J438 Other emphysema: Secondary | ICD-10-CM

## 2018-03-27 ENCOUNTER — Other Ambulatory Visit: Payer: Self-pay | Admitting: Internal Medicine

## 2018-04-04 ENCOUNTER — Ambulatory Visit (INDEPENDENT_AMBULATORY_CARE_PROVIDER_SITE_OTHER): Payer: Medicare Other | Admitting: *Deleted

## 2018-04-04 DIAGNOSIS — M810 Age-related osteoporosis without current pathological fracture: Secondary | ICD-10-CM | POA: Diagnosis not present

## 2018-04-04 MED ORDER — DENOSUMAB 60 MG/ML ~~LOC~~ SOSY
60.0000 mg | PREFILLED_SYRINGE | Freq: Once | SUBCUTANEOUS | Status: AC
  Administered 2018-04-04: 60 mg via SUBCUTANEOUS

## 2018-04-23 ENCOUNTER — Other Ambulatory Visit: Payer: Self-pay | Admitting: Internal Medicine

## 2018-04-23 DIAGNOSIS — J301 Allergic rhinitis due to pollen: Secondary | ICD-10-CM

## 2018-05-01 ENCOUNTER — Other Ambulatory Visit: Payer: Self-pay | Admitting: Internal Medicine

## 2018-05-18 ENCOUNTER — Other Ambulatory Visit: Payer: Self-pay | Admitting: Internal Medicine

## 2018-05-18 DIAGNOSIS — Z1231 Encounter for screening mammogram for malignant neoplasm of breast: Secondary | ICD-10-CM

## 2018-06-11 ENCOUNTER — Other Ambulatory Visit: Payer: Self-pay | Admitting: Internal Medicine

## 2018-06-11 DIAGNOSIS — J301 Allergic rhinitis due to pollen: Secondary | ICD-10-CM

## 2018-06-12 ENCOUNTER — Ambulatory Visit: Payer: Medicare Other | Admitting: Internal Medicine

## 2018-06-20 ENCOUNTER — Ambulatory Visit
Admission: RE | Admit: 2018-06-20 | Discharge: 2018-06-20 | Disposition: A | Discharge status: Home / Self Care | Payer: Medicare Other | Source: Ambulatory Visit | Attending: Internal Medicine | Admitting: Internal Medicine

## 2018-06-20 DIAGNOSIS — Z1231 Encounter for screening mammogram for malignant neoplasm of breast: Secondary | ICD-10-CM

## 2018-06-20 LAB — HM MAMMOGRAPHY

## 2018-06-29 ENCOUNTER — Other Ambulatory Visit: Payer: Self-pay | Admitting: Internal Medicine

## 2018-06-30 ENCOUNTER — Telehealth: Payer: Self-pay | Admitting: Internal Medicine

## 2018-06-30 NOTE — Telephone Encounter (Signed)
Faxed signed orders for certificate of medical necessity for oxygen to [facility] @ [phone]  O2 sats 88% on 08/16/17 Length of need - 99 months (lifetime) Dx: J44.9 (COPD)

## 2018-07-02 ENCOUNTER — Other Ambulatory Visit: Payer: Self-pay | Admitting: Internal Medicine

## 2018-07-03 ENCOUNTER — Ambulatory Visit (INDEPENDENT_AMBULATORY_CARE_PROVIDER_SITE_OTHER): Payer: Medicare Other | Admitting: Internal Medicine

## 2018-07-03 ENCOUNTER — Encounter: Payer: Self-pay | Admitting: Internal Medicine

## 2018-07-03 VITALS — BP 96/58 | HR 73 | Ht 62.0 in | Wt 122.8 lb

## 2018-07-03 DIAGNOSIS — Z9981 Dependence on supplemental oxygen: Secondary | ICD-10-CM | POA: Diagnosis not present

## 2018-07-03 DIAGNOSIS — J449 Chronic obstructive pulmonary disease, unspecified: Secondary | ICD-10-CM | POA: Diagnosis not present

## 2018-07-03 DIAGNOSIS — I350 Nonrheumatic aortic (valve) stenosis: Secondary | ICD-10-CM | POA: Diagnosis not present

## 2018-07-03 DIAGNOSIS — I2510 Atherosclerotic heart disease of native coronary artery without angina pectoris: Secondary | ICD-10-CM | POA: Diagnosis not present

## 2018-07-03 NOTE — Patient Instructions (Signed)
Medication Instructions:  Continue current medications If you need a refill on your cardiac medications before your next appointment, please call your pharmacy.   Follow-Up: At [facility], you and your health needs are our priority.  As part of our continuing mission to provide you with exceptional heart care, we have created designated Provider Care Teams.  These Care Teams include your primary Cardiologist (physician) and Advanced Practice Providers (APPs -  Physician Assistants and Nurse Practitioners) who all work together to provide you with the care you need, when you need it. You will need a follow up appointment in 6 months.  Please call our office 2 months in advance to schedule this appointment.  You may see Kenneth C Hilty, MD or one of the following Advanced Practice Providers on your designated Care Team: Hao Meng, PA-C . Angela Duke, PA-C  Any Other Special Instructions Will Be Listed Below (If Applicable).    

## 2018-07-03 NOTE — Progress Notes (Signed)
OFFICE NOTE  Chief Complaint:  No complaints  Primary Care Physician: [name], MD  HPI:  [name] a pleasant 72 year old female with a history of COPD and severe pulmonary hypertension. Recently she was put on home oxygen and has been on an oxygen concentrator, which has made a marked improvement in her ability to get around. She has previously seen Dr. Lamonte Sakai with pulmonology; however, that did not go too well and she has basically given up on any further treatments for COPD other than her oxygen. She still uses nebulizers as needed when she is tight, and I recommended Claritin for seasonal allergies today. As you know, she also has mild aortic stenosis and we are continuing to follow that. She is a low-risk stress test in November 2012. Unfortunately she was recently admitted for altered mental status, possible Tylenol overdose, respiratory failure and liver failure. All of which seems to have resolved. She seems to be doing pre-well after discharge.  She returns today for followup. She reports doing fairly well. She has seen Dr. Lake Bells in the interim and he is recommended continue current therapies. She is now seeing a different internist, Dr. Ronnald Ramp. He was recently admitted the hospital for pneumonia and treated accordingly. She had troponins which were negative suggesting they're likely is no significant underlying obstructive coronary disease. She reports she's recovered and is back to her base. She is overdue for repeat lipid profile.  I saw [name] back in the office today. Overall she reports she is doing fairly well. She's not been admitted to the hospital recently. She says she has stable shortness of breath. She denies any chest pain. It should be noted that she does have mild aortic stenosis which will last assessed in 2014.  [name] returns today for follow-up. She reports that she is doing fairly well. Fortune she's not been hospitalized. She has had an  episode of COPD exacerbation which was supported with early antibiotics. She denies any worsening chest pain and has pretty stable shortness of breath on oxygen. She is able to ambulate and do most of her activities without any restrictions.  03/31/2016  [name] was seen back today in the office for follow-up. She seems to be doing fairly well although does use her oxygen almost continuously. Saturation was low today although she had heavy fingernail polish on and therefore the reading may not be accurate as it read 88%. She was on oxygen via a concentrator. Blood pressure was stable at 108/74. Weight is been stable. She recently had a repeat echocardiogram which shows stable mild aortic stenosis and normal LV function with some mild improvement in pulmonary pressures in May. She denies any chest pain. Fortune that she's not been admitted to the hospital recently for any pneumonia but did have problems with recent gallbladder disease and had cholecystectomy.  12/17/2016  [name] returns today for follow-up. She's done well over the last year has not been hospitalized. She reports stable shortness of breath. She had mild aortic stenosis which is stable by echo last year and will need to be repeated in May of this year. Her last lipid profile showed total cholesterol 197, HDL C 78, triglycerides 100, an LDL-C 99. We'll need to repeat her lipid profile as well.  06/20/2017  I saw [name] back today for follow-up. She is doing well. We repeated on echo in 01/2017, this demonstrated normal LVEF 60-65%, mild stable aortic stenosis. She denies any worsening shortness of breath or chest  pain. Cholesterol remains elevated. Will plan to recheck that today. She could conceivably increase her Livalo. She reports having had her flu vaccine this year.  08/16/2017  [name] returns today for follow-up.  She seen specifically for oxygen testing.  We tested her room air saturation off of oxygen and it was noted to be 88% at rest.   We did not do ambulatory testing as she met criteria for oxygen therapy.  She is currently on an oxygen concentrator which we wish to renew.  She needs the oxygen concentrator as she is active and is mobile but requires continuous oxygen therapy -she is on 3 L continuous by nasal cannula.  12/09/2017  [name] returns today for follow-up.  She reports doing fairly well.  She is pleased that she finally got her oxygen concentrator.  Blood pressure is well-controlled today 122/72.  She denies chest pain or worsening shortness of breath.  Should her weight is been stable.  She had a recent lipid profile in December which showed LDL cholesterol of 81 and non-HDL cholesterol of 98.  She does have mild aortic valve stenosis which was assessed by echo last year.  07/03/2018   [name] returns today for follow-up.  Overall she feels like her breathing is done well.  She has not been hospitalized for pulmonary issues.  She denies chest pain or significant shortness of breath.  She continues to use oxygen.  She reports some improvement in her headaches.  Blood pressure remains soft but does not seem to be bothersome for her.  Her last lipid profile shows good control of her cholesterol.  She remains on Livalo.  Echo in 2018 showed mild aortic stenosis.  PMHx:  Past Medical History:  Diagnosis Date  . Abnormal LFTs 08/02/2011  . Acute liver failure 06/19/2013  . Acute renal failure ([place]) 06/19/2013  . Acute respiratory failure ([place]) 06/19/2013  . Altered mental status 08/01/2011  . Angina   . Anxiety   . Anxiety state, unspecified 12/03/2013  . Aortic stenosis    mild  . Arthritis    "hands" (02/03/2017)  . Breast cancer of upper-outer quadrant of left female breast ([place]) 08/21/2013  . Breast cancer, right breast ([place]) dx'd 2008  . CAD (coronary artery disease) of artery bypass graft 11/06/2013  . CAD (coronary artery disease), MI R/O 08/01/2011   PCI in 2006 (bare metal stent, unknown artery) - [place]   . CHF,  acute  diastolic, BNP 4k on admissio 08/01/2011  . Chronic bronchitis ([place])   . Chronic respiratory failure ([place]) 02/02/2012  . Compression fracture of L1 lumbar vertebra ([place]) 02/02/2012  . COPD (chronic obstructive pulmonary disease) (H[facility])    oxygen-dependent 4LPM [place]  . Coronary artery disease 2006   2 stents w/previous MI  . Depressed   . Diastolic CHF ([place])   . Dyslipidemia   . Family history of adverse reaction to anesthesia    "daughter had c-section; missed twice w/epidural"  . GERD (gastroesophageal reflux disease)   . Heart murmur   . History of blood transfusion    "w/my colon OR"  . 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 History of nuclear stress test 08/03/2011   attenuation at apex - no perfusion defects   . HTN (hypertension) 11/06/2013  . Hyperkalemia, on ACE prior to admission 11/11/2011  . Hypertension   . Hyponatremia 01/31/2012  . Migraine    "qod to q couple months since I was 21" (02/03/2017)  . Mild aortic stenosis 08/01/2011  AVA 1.69 cm2 (06/02/2011)   . Moderate to severe pulmonary hypertension ([place])   . NSTEMI (non-ST elevated myocardial infarction) ([place]) 2006  . NSVT (nonsustained ventricular tachycardia) (HCC)    h/o  . On home oxygen therapy    "3L just at night; have it available prn duringtheday" (08/24/2017)  . Pneumonia    "alot of times" (02/03/2017)    Past Surgical History:  Procedure Laterality Date  . BREAST BIOPSY Right 2008  . BREAST LUMPECTOMY Right 2008   malignant  . CATARACT EXTRACTION W/ INTRAOCULAR LENS  IMPLANT, BILATERAL Bilateral ~ 2013  . [place]; 1971; 1978  . CHOLECYSTECTOMY OPEN  2011  . COLON SURGERY    . COLOSTOMY  11/2007  . COLOSTOMY CLOSURE  07/2008  . CORONARY ANGIOPLASTY WITH STENT PLACEMENT  2006   "2 stents"  . ERCP N/A 09/05/2015   Procedure: ENDOSCOPIC RETROGRADE CHOLANGIOPANCREATOGRAPHY (ERCP);  Surgeon: Ladene Artist, MD;  Location: [facility];  Service: Endoscopy;  Laterality: N/A;  . PARTIAL  COLECTOMY  2009   for obstruction: temporary ostomy, later reversed.   Marland Kitchen RIGHT HEART CATHETERIZATION N/A 09/05/2013   Procedure: RIGHT HEART CATH;  Surgeon: Jolaine Artist, MD;  Location: [facility] CATH LAB;  Service: Cardiovascular;  Laterality: N/A;  . TRANSTHORACIC ECHOCARDIOGRAM  11/12/2011   EF 88-89%, normal systolic function, grade 1 diastolic dysfunction; ventricular septal flattening (D-sign); mild AS; trace-mild MR; LA mildly dilated; RV mod dilated; RA mod dilated; severe pulm HTN; elevated CVP    FAMHx:  Family History  Problem Relation Age of Onset  . Schizophrenia Sister   . Alzheimer's disease Mother   . Hyperlipidemia Brother   . Heart disease Sister   . Diabetes Sister   . Cancer Neg Hx   . Stroke Neg Hx   . COPD Neg Hx   . Depression Neg Hx   . Drug abuse Neg Hx   . Early death Neg Hx   . Hypertension Neg Hx   . Kidney disease Neg Hx     SOCHx:   reports that she has been smoking cigarettes. She has a 5.20 pack-year smoking history. She has never used smokeless tobacco. She reports that she drinks about 1.0 standard drinks of alcohol per week. She reports that she does not use drugs.  ALLERGIES:  Allergies  Allergen Reactions  . Ceftriaxone Anaphylaxis and Other (See Comments)    *ROCEPHIN*  "Blow up like a balloon"  . Hydroxyzine Shortness Of Breath and Other (See Comments)    Pt states med make her light headed, get sob sxs  . Doxycycline Nausea And Vomiting  . Lexapro [Escitalopram] Other (See Comments)    Pt states med make her dizzy    ROS: Pertinent items noted in HPI and remainder of comprehensive ROS otherwise negative.  HOME MEDS: Current Outpatient Medications  Medication Sig Dispense Refill  . ALPRAZolam (XANAX) 1 MG tablet TAKE 1 TABLET BY MOUTH TWICE A DAY AS NEEDED 60 tablet 3  . Artificial Tear Ointment (DRY EYES OP) Apply 2 drops to eye daily as needed (dry eyes).     Marland Kitchen aspirin 325 MG EC tablet Take 1 tablet (325 mg total) by mouth every  morning.    . ATROVENT HFA 17 MCG/ACT inhaler USE 2 PUFFS INTO THE LUNGS EVERY 4 HOURS AS NEEDED (Patient taking differently: USE 2 PUFFS INTO THE LUNGS EVERY 4 HOURS AS NEEDED FOR SOB) 77.4 Inhaler 5  . butalbital-acetaminophen-caffeine (FIORICET, ESGIC) 50-325-40 MG tablet TAKE  1 TO 2 TABLETS BY MOUTH AS NEEDED FOR HEADACHE 60 tablet 5  . Calcium Carbonate (CALCIUM 600 PO) Take 1 tablet by mouth daily.     . cholecalciferol (VITAMIN D) 1000 units tablet Take 1,000 Units by mouth daily.    Marland Kitchen denosumab ([name]) 60 MG/ML SOLN injection Inject 60 mg into the skin every 6 (six) months. Administer in upper arm, thigh, or abdomen 1 each 0  . diclofenac sodium (VOLTAREN) 1 % GEL APPLY 4 G TOPICALLY DAILY AS NEEDED (APPLY TO HANDS). HAND PAIN 100 g 5  . fluticasone (FLONASE) 50 MCG/ACT nasal spray USE 2 SPRAYS INTO EACH NOSTRIL ONCE DAILY**REPEAT FOR 5 DAYS THEN STOP (Patient taking differently: USE 2 SPRAYS INTO EACH NOSTRIL ONCE DAILY AS NEEDED FOR ALLERGIES **REPEAT FOR 5 DAYS THEN STOP) 16 g 11  . furosemide (LASIX) 40 MG tablet TAKE 1 TABLET BY MOUTH TWICE A DAY 180 tablet 1  . ipratropium-albuterol (DUONEB) 0.5-2.5 (3) MG/3ML SOLN NEBULIZE 1 VIAL TWICE A DAY AS NEEDED 180 mL 3  . lactulose, encephalopathy, (CHRONULAC) 10 GM/15ML SOLN TAKE 30 MLS (6 TSP) BY MOUTH 3 TIMES A DAY AS NEEDED FOR CONSTIPATION 1892 mL 1  . levocetirizine (XYZAL) 5 MG tablet TAKE 1 TABLET BY MOUTH EVERY EVENING 90 tablet 3  . LIVALO 2 MG TABS TAKE 1 TABLET BY MOUTH EVERY DAY 90 tablet 1  . montelukast (SINGULAIR) 10 MG tablet TAKE 1 TABLET BY MOUTH EVERYDAY AT BEDTIME 90 tablet 1  . Multiple Vitamin (MULTIVITAMIN) tablet Take 1 tablet by mouth daily.    . nortriptyline (PAMELOR) 25 MG capsule Take 2 capsules (50 mg total) by mouth at bedtime. 180 capsule 1  . Omega 3 1200 MG CAPS Take 1,200 mg by mouth every morning.     . OXYGEN-HELIUM IN Inhale 3 L into the lungs See admin instructions. Uses when needed during the day, uses  continuous throughout the night    . pantoprazole (PROTONIX) 40 MG tablet TAKE 1 TABLET BY MOUTH EVERY DAY 90 tablet 1  . potassium chloride SA (KLOR-CON M20) 20 MEQ tablet Take 1 tablet (20 mEq total) by mouth daily. 90 tablet 3  . SYMBICORT 160-4.5 MCG/ACT inhaler Inhale 2 puffs into the lungs 2 (two) times daily. 10.2 Inhaler 11  . VENTOLIN HFA 108 (90 Base) MCG/ACT inhaler INHALE 2 PUFFS INTO THE LUNGS EVERY 4 HOURS AS NEEDED 36 Inhaler 5  . vitamin C (ASCORBIC ACID) 500 MG tablet Take 500 mg by mouth daily.     No current facility-administered medications for this visit.     LABS/IMAGING: No results found for this or any previous visit (from the past 48 hour(s)). No results found.  VITALS: BP (!) 96/58   Pulse 73   Ht _0  (1.575 m)   Wt 122 lb 12.8 oz (55.7 kg)   BMI 22.46 kg/m   EXAM: General appearance: alert and no distress Neck: no carotid bruit, no JVD and thyroid not enlarged, symmetric, no tenderness/mass/nodules Lungs: diminished breath sounds bilaterally Heart: regular rate and rhythm, S1, S2 normal and systolic murmur: early systolic 3/6, crescendo at 2nd right intercostal space Abdomen: soft, non-tender; bowel sounds normal; no masses,  no organomegaly Extremities: extremities normal, atraumatic, no cyanosis or edema Pulses: 2+ and symmetric Skin: Skin color, texture, turgor normal. No rashes or lesions Neurologic: Grossly normal Psych: Pleasant  EKG: Sinus rhythm with PVCs at 73, possible left atrial enlargement-personally reviewed  ASSESSMENT: 1. Severe COPD with moderate pulmonary hypertension, on  chronic oxygen (disabled d/t this) 2. Mild aortic stenosis 3. Low risk nuclear stress testing in 2012 4. Dyslipidemia - on livalo   5. History of CAD with prior remote PCI  PLAN: 1.   Ms. Hopkins seems to be doing well from a cardiac standpoint.  Her aortic stenosis is stable.  She has not been hospitalized fortunately for pulmonary issues.  She has severe  COPD and on oxygen.  She has had flu shot.  She is also had pneumonia vaccine within the past 5 years.  No changes made to her medicines today.  I have recently renewed her oxygen prescription.  Follow-up with me in 6 months or sooner as necessary.  Pixie Casino, MD, [facility], [place] Director of the Advanced Lipid Disorders &  Cardiovascular Risk Reduction [facility] Diplomate of the American Board of Clinical Lipidology Attending Cardiologist  Direct Dial: [phone]  Fax: [phone]  Website:  [email or web address] 07/03/2018, 9:03 AM

## 2018-07-05 ENCOUNTER — Other Ambulatory Visit: Payer: Self-pay | Admitting: Internal Medicine

## 2018-07-05 DIAGNOSIS — M19049 Primary osteoarthritis, unspecified hand: Secondary | ICD-10-CM

## 2018-07-19 ENCOUNTER — Other Ambulatory Visit: Payer: Self-pay | Admitting: Internal Medicine

## 2018-07-22 ENCOUNTER — Other Ambulatory Visit: Payer: Self-pay | Admitting: Internal Medicine

## 2018-07-26 ENCOUNTER — Other Ambulatory Visit: Payer: Self-pay | Admitting: Internal Medicine

## 2018-07-26 DIAGNOSIS — E785 Hyperlipidemia, unspecified: Secondary | ICD-10-CM

## 2018-07-26 DIAGNOSIS — F5105 Insomnia due to other mental disorder: Secondary | ICD-10-CM

## 2018-07-26 DIAGNOSIS — F409 Phobic anxiety disorder, unspecified: Secondary | ICD-10-CM

## 2018-08-09 ENCOUNTER — Other Ambulatory Visit: Payer: Self-pay | Admitting: Internal Medicine

## 2018-08-09 DIAGNOSIS — J438 Other emphysema: Secondary | ICD-10-CM

## 2018-08-09 DIAGNOSIS — J42 Unspecified chronic bronchitis: Secondary | ICD-10-CM

## 2018-08-18 ENCOUNTER — Telehealth: Payer: Self-pay | Admitting: Internal Medicine

## 2018-08-18 NOTE — Telephone Encounter (Signed)
Copied from [place] [id number]. Topic: Quick Communication - Rx Refill/Question >> Aug 18, 2018  1:25 PM Yvette Rack wrote: Medication: [name] injection  Has the patient contacted their pharmacy? Yes.   (Agent: If no, request that the patient contact the pharmacy for the refill.) (Agent: If yes, when and what did the pharmacy advise?) phARMACY CALLED   Preferred Pharmacy (with phone number or street name):  [facility], [place] - [address] [phone] (Phone) [phone] (Fax)     Agent: Please be advised that RX refills may take up to 3 business days. We ask that you follow-up with your pharmacy.

## 2018-08-18 NOTE — Telephone Encounter (Signed)
Can not reorder the prolia injection / Send to practice for order /

## 2018-08-18 NOTE — Telephone Encounter (Signed)
This is not something that we send in refills for.. Patient will be due for her next [name] injection in January. We will verify benefits prior and contact patient to schedule.

## 2018-08-22 ENCOUNTER — Other Ambulatory Visit: Payer: Self-pay | Admitting: Internal Medicine

## 2018-08-25 ENCOUNTER — Other Ambulatory Visit: Payer: Self-pay | Admitting: Internal Medicine

## 2018-08-29 ENCOUNTER — Other Ambulatory Visit: Payer: Self-pay | Admitting: Internal Medicine

## 2018-09-05 ENCOUNTER — Ambulatory Visit: Payer: Medicare Other

## 2018-09-05 ENCOUNTER — Ambulatory Visit (INDEPENDENT_AMBULATORY_CARE_PROVIDER_SITE_OTHER): Payer: Medicare Other | Admitting: *Deleted

## 2018-09-05 DIAGNOSIS — M810 Age-related osteoporosis without current pathological fracture: Secondary | ICD-10-CM

## 2018-09-05 MED ORDER — DENOSUMAB 60 MG/ML ~~LOC~~ SOSY
60.0000 mg | PREFILLED_SYRINGE | Freq: Once | SUBCUTANEOUS | Status: AC
  Administered 2018-09-05: 60 mg via SUBCUTANEOUS

## 2018-09-06 ENCOUNTER — Other Ambulatory Visit: Payer: Self-pay | Admitting: Internal Medicine

## 2018-09-11 ENCOUNTER — Observation Stay (HOSPITAL_COMMUNITY)
Admission: EM | Admit: 2018-09-11 | Discharge: 2018-09-12 | Disposition: A | Discharge status: Home / Self Care | Payer: Medicare Other | Attending: Internal Medicine | Admitting: Internal Medicine

## 2018-09-11 ENCOUNTER — Encounter (HOSPITAL_COMMUNITY): Payer: Self-pay | Admitting: *Deleted

## 2018-09-11 ENCOUNTER — Other Ambulatory Visit: Payer: Self-pay

## 2018-09-11 ENCOUNTER — Inpatient Hospital Stay (HOSPITAL_COMMUNITY): Payer: Medicare Other

## 2018-09-11 ENCOUNTER — Emergency Department (HOSPITAL_COMMUNITY): Payer: Medicare Other

## 2018-09-11 DIAGNOSIS — I272 Pulmonary hypertension, unspecified: Secondary | ICD-10-CM | POA: Insufficient documentation

## 2018-09-11 DIAGNOSIS — Z888 Allergy status to other drugs, medicaments and biological substances status: Secondary | ICD-10-CM | POA: Diagnosis not present

## 2018-09-11 DIAGNOSIS — Z9981 Dependence on supplemental oxygen: Secondary | ICD-10-CM | POA: Insufficient documentation

## 2018-09-11 DIAGNOSIS — J9621 Acute and chronic respiratory failure with hypoxia: Principal | ICD-10-CM | POA: Insufficient documentation

## 2018-09-11 DIAGNOSIS — N183 Chronic kidney disease, stage 3 (moderate): Secondary | ICD-10-CM | POA: Insufficient documentation

## 2018-09-11 DIAGNOSIS — Z79899 Other long term (current) drug therapy: Secondary | ICD-10-CM | POA: Diagnosis not present

## 2018-09-11 DIAGNOSIS — I5032 Chronic diastolic (congestive) heart failure: Secondary | ICD-10-CM | POA: Insufficient documentation

## 2018-09-11 DIAGNOSIS — J449 Chronic obstructive pulmonary disease, unspecified: Secondary | ICD-10-CM

## 2018-09-11 DIAGNOSIS — I350 Nonrheumatic aortic (valve) stenosis: Secondary | ICD-10-CM

## 2018-09-11 DIAGNOSIS — C50412 Malignant neoplasm of upper-outer quadrant of left female breast: Secondary | ICD-10-CM | POA: Insufficient documentation

## 2018-09-11 DIAGNOSIS — Z881 Allergy status to other antibiotic agents status: Secondary | ICD-10-CM | POA: Diagnosis not present

## 2018-09-11 DIAGNOSIS — I130 Hypertensive heart and chronic kidney disease with heart failure and stage 1 through stage 4 chronic kidney disease, or unspecified chronic kidney disease: Secondary | ICD-10-CM | POA: Diagnosis not present

## 2018-09-11 DIAGNOSIS — J441 Chronic obstructive pulmonary disease with (acute) exacerbation: Secondary | ICD-10-CM | POA: Insufficient documentation

## 2018-09-11 DIAGNOSIS — J9601 Acute respiratory failure with hypoxia: Secondary | ICD-10-CM | POA: Diagnosis present

## 2018-09-11 DIAGNOSIS — J9611 Chronic respiratory failure with hypoxia: Secondary | ICD-10-CM | POA: Diagnosis present

## 2018-09-11 DIAGNOSIS — J189 Pneumonia, unspecified organism: Secondary | ICD-10-CM

## 2018-09-11 DIAGNOSIS — C50411 Malignant neoplasm of upper-outer quadrant of right female breast: Secondary | ICD-10-CM | POA: Diagnosis present

## 2018-09-11 DIAGNOSIS — I10 Essential (primary) hypertension: Secondary | ICD-10-CM | POA: Diagnosis present

## 2018-09-11 DIAGNOSIS — J440 Chronic obstructive pulmonary disease with acute lower respiratory infection: Secondary | ICD-10-CM | POA: Diagnosis not present

## 2018-09-11 HISTORY — DX: Pneumonia, unspecified organism: J18.9

## 2018-09-11 LAB — BASIC METABOLIC PANEL
Anion gap: 14 (ref 5–15)
BUN: 9 mg/dL (ref 8–23)
CO2: 29 mmol/L (ref 22–32)
Calcium: 9 mg/dL (ref 8.9–10.3)
Chloride: 95 mmol/L — ABNORMAL LOW (ref 98–111)
Creatinine, Ser: 1.14 mg/dL — ABNORMAL HIGH (ref 0.44–1.00)
GFR calc Af Amer: 56 mL/min — ABNORMAL LOW (ref >60)
GFR calc non Af Amer: 48 mL/min — ABNORMAL LOW (ref >60)
Glucose, Bld: 111 mg/dL — ABNORMAL HIGH (ref 70–99)
Potassium: 3.5 mmol/L (ref 3.5–5.1)
Sodium: 138 mmol/L (ref 135–145)

## 2018-09-11 LAB — PROCALCITONIN: Procalcitonin: 0.72 ng/mL

## 2018-09-11 LAB — CBC
HCT: 46.8 % — ABNORMAL HIGH (ref 36.0–46.0)
HCT: 42.3 % (ref 36.0–46.0)
Hemoglobin: 14.1 g/dL (ref 12.0–15.0)
Hemoglobin: 12.6 g/dL (ref 12.0–15.0)
MCH: 30.2 pg (ref 26.0–34.0)
MCH: 30.1 pg (ref 26.0–34.0)
MCHC: 30.1 g/dL (ref 30.0–36.0)
MCHC: 29.8 g/dL — ABNORMAL LOW (ref 30.0–36.0)
MCV: 100.2 fL — ABNORMAL HIGH (ref 80.0–100.0)
MCV: 101.2 fL — ABNORMAL HIGH (ref 80.0–100.0)
Platelets: 225 10*3/uL (ref 150–400)
Platelets: 201 10*3/uL (ref 150–400)
RBC: 4.67 MIL/uL (ref 3.87–5.11)
RBC: 4.18 MIL/uL (ref 3.87–5.11)
RDW: 12.9 % (ref 11.5–15.5)
RDW: 12.9 % (ref 11.5–15.5)
WBC: 16.1 10*3/uL — ABNORMAL HIGH (ref 4.0–10.5)
WBC: 16.4 10*3/uL — ABNORMAL HIGH (ref 4.0–10.5)
nRBC: 0 % (ref 0.0–0.2)
nRBC: 0 % (ref 0.0–0.2)

## 2018-09-11 LAB — I-STAT TROPONIN, ED: Troponin i, poc: 0.02 ng/mL (ref 0.00–0.08)

## 2018-09-11 LAB — TROPONIN I
Troponin I: <0.03 ng/mL (ref <0.03)
Troponin I: <0.03 ng/mL (ref <0.03)
Troponin I: <0.03 ng/mL (ref <0.03)

## 2018-09-11 LAB — ECHOCARDIOGRAM COMPLETE
Height: 62 in
Weight: 1920 oz

## 2018-09-11 LAB — BRAIN NATRIURETIC PEPTIDE: B Natriuretic Peptide: 170.3 pg/mL — ABNORMAL HIGH (ref 0.0–100.0)

## 2018-09-11 LAB — INFLUENZA PANEL BY PCR (TYPE A & B)
Influenza A By PCR: NEGATIVE
Influenza B By PCR: NEGATIVE

## 2018-09-11 LAB — CREATININE, SERUM
Creatinine, Ser: 1.15 mg/dL — ABNORMAL HIGH (ref 0.44–1.00)
GFR calc Af Amer: 55 mL/min — ABNORMAL LOW (ref >60)
GFR calc non Af Amer: 47 mL/min — ABNORMAL LOW (ref >60)

## 2018-09-11 LAB — MAGNESIUM: Magnesium: 1.9 mg/dL (ref 1.7–2.4)

## 2018-09-11 LAB — D-DIMER, QUANTITATIVE: D-Dimer, Quant: 1.05 ug/mL-FEU — ABNORMAL HIGH (ref 0.00–0.50)

## 2018-09-11 LAB — TSH: TSH: 0.341 u[IU]/mL — ABNORMAL LOW (ref 0.350–4.500)

## 2018-09-11 MED ORDER — VITAMIN C 500 MG PO TABS
500.0000 mg | ORAL_TABLET | Freq: Every day | ORAL | Status: DC
  Administered 2018-09-11 – 2018-09-12 (×2): 500 mg via ORAL
  Filled 2018-09-11 (×3): qty 1

## 2018-09-11 MED ORDER — ALBUTEROL SULFATE (2.5 MG/3ML) 0.083% IN NEBU
5.0000 mg | INHALATION_SOLUTION | Freq: Once | RESPIRATORY_TRACT | Status: AC
  Administered 2018-09-11: 5 mg via RESPIRATORY_TRACT
  Filled 2018-09-11: qty 6

## 2018-09-11 MED ORDER — MOMETASONE FURO-FORMOTEROL FUM 200-5 MCG/ACT IN AERO
2.0000 | INHALATION_SPRAY | Freq: Two times a day (BID) | RESPIRATORY_TRACT | Status: DC
  Administered 2018-09-11 – 2018-09-12 (×3): 2 via RESPIRATORY_TRACT
  Filled 2018-09-11: qty 8.8

## 2018-09-11 MED ORDER — LACTULOSE 10 GM/15ML PO SOLN
30.0000 g | Freq: Three times a day (TID) | ORAL | Status: DC | PRN
  Administered 2018-09-11: 30 g via ORAL
  Filled 2018-09-11 (×2): qty 45

## 2018-09-11 MED ORDER — ENOXAPARIN SODIUM 40 MG/0.4ML ~~LOC~~ SOLN
40.0000 mg | SUBCUTANEOUS | Status: DC
  Administered 2018-09-11: 40 mg via SUBCUTANEOUS
  Filled 2018-09-11 (×2): qty 0.4

## 2018-09-11 MED ORDER — ONDANSETRON HCL 4 MG/2ML IJ SOLN: 4.0000 mg | Freq: Four times a day (QID) | INTRAMUSCULAR | Status: DC | PRN

## 2018-09-11 MED ORDER — LEVOFLOXACIN IN D5W 500 MG/100ML IV SOLN: 500.0000 mg | INTRAVENOUS | Status: DC

## 2018-09-11 MED ORDER — POTASSIUM CHLORIDE CRYS ER 20 MEQ PO TBCR
20.0000 meq | EXTENDED_RELEASE_TABLET | Freq: Every day | ORAL | Status: DC
  Administered 2018-09-11 – 2018-09-12 (×2): 20 meq via ORAL
  Filled 2018-09-11 (×2): qty 1

## 2018-09-11 MED ORDER — IPRATROPIUM-ALBUTEROL 0.5-2.5 (3) MG/3ML IN SOLN: 3.0000 mL | RESPIRATORY_TRACT | Status: DC | PRN

## 2018-09-11 MED ORDER — ASPIRIN EC 325 MG PO TBEC
325.0000 mg | DELAYED_RELEASE_TABLET | Freq: Every day | ORAL | Status: DC
  Administered 2018-09-11 – 2018-09-12 (×2): 325 mg via ORAL
  Filled 2018-09-11 (×2): qty 1

## 2018-09-11 MED ORDER — IPRATROPIUM BROMIDE 0.02 % IN SOLN
0.5000 mg | Freq: Once | RESPIRATORY_TRACT | Status: AC
  Administered 2018-09-11: 0.5 mg via RESPIRATORY_TRACT
  Filled 2018-09-11: qty 2.5

## 2018-09-11 MED ORDER — LORATADINE 10 MG PO TABS
10.0000 mg | ORAL_TABLET | Freq: Every evening | ORAL | Status: DC
  Administered 2018-09-11: 10 mg via ORAL
  Filled 2018-09-11: qty 1

## 2018-09-11 MED ORDER — OMEGA-3-ACID ETHYL ESTERS 1 G PO CAPS
1000.0000 mg | ORAL_CAPSULE | Freq: Every morning | ORAL | Status: DC
  Administered 2018-09-11 – 2018-09-12 (×2): 1000 mg via ORAL
  Filled 2018-09-11 (×2): qty 1

## 2018-09-11 MED ORDER — PANTOPRAZOLE SODIUM 40 MG PO TBEC
40.0000 mg | DELAYED_RELEASE_TABLET | Freq: Every day | ORAL | Status: DC
  Administered 2018-09-11 – 2018-09-12 (×2): 40 mg via ORAL
  Filled 2018-09-11 (×2): qty 1

## 2018-09-11 MED ORDER — LEVOFLOXACIN IN D5W 250 MG/50ML IV SOLN
250.0000 mg | INTRAVENOUS | Status: DC
  Administered 2018-09-12: 250 mg via INTRAVENOUS
  Filled 2018-09-11: qty 50

## 2018-09-11 MED ORDER — FUROSEMIDE 10 MG/ML IJ SOLN
40.0000 mg | Freq: Two times a day (BID) | INTRAMUSCULAR | Status: DC
  Administered 2018-09-11 – 2018-09-12 (×3): 40 mg via INTRAVENOUS
  Filled 2018-09-11 (×3): qty 4

## 2018-09-11 MED ORDER — ACETAMINOPHEN 650 MG RE SUPP: 650.0000 mg | Freq: Four times a day (QID) | RECTAL | Status: DC | PRN

## 2018-09-11 MED ORDER — NORTRIPTYLINE HCL 25 MG PO CAPS
50.0000 mg | ORAL_CAPSULE | Freq: Every day | ORAL | Status: DC
  Administered 2018-09-11: 50 mg via ORAL
  Filled 2018-09-11: qty 2

## 2018-09-11 MED ORDER — LEVOFLOXACIN IN D5W 750 MG/150ML IV SOLN
750.0000 mg | Freq: Once | INTRAVENOUS | Status: AC
  Administered 2018-09-11: 750 mg via INTRAVENOUS
  Filled 2018-09-11: qty 150

## 2018-09-11 MED ORDER — ONDANSETRON HCL 4 MG PO TABS: 4.0000 mg | ORAL_TABLET | Freq: Four times a day (QID) | ORAL | Status: DC | PRN

## 2018-09-11 MED ORDER — FLUTICASONE PROPIONATE 50 MCG/ACT NA SUSP: 2.0000 | Freq: Every day | NASAL | Status: DC | PRN

## 2018-09-11 MED ORDER — PRAVASTATIN SODIUM 40 MG PO TABS
40.0000 mg | ORAL_TABLET | Freq: Every day | ORAL | Status: DC
  Administered 2018-09-11: 40 mg via ORAL
  Filled 2018-09-11: qty 1

## 2018-09-11 MED ORDER — ALPRAZOLAM 0.5 MG PO TABS
1.0000 mg | ORAL_TABLET | Freq: Two times a day (BID) | ORAL | Status: DC | PRN
  Administered 2018-09-11 (×2): 1 mg via ORAL
  Filled 2018-09-11: qty 2
  Filled 2018-09-11: qty 4

## 2018-09-11 MED ORDER — ACETAMINOPHEN 325 MG PO TABS
650.0000 mg | ORAL_TABLET | Freq: Four times a day (QID) | ORAL | Status: DC | PRN
  Administered 2018-09-11: 650 mg via ORAL
  Filled 2018-09-11: qty 2

## 2018-09-11 MED ORDER — MONTELUKAST SODIUM 10 MG PO TABS
10.0000 mg | ORAL_TABLET | Freq: Every day | ORAL | Status: DC
  Administered 2018-09-11: 10 mg via ORAL
  Filled 2018-09-11: qty 1

## 2018-09-11 NOTE — H&P (Signed)
History and Physical    [name]:[id number] DOB: Oct 24, 1945 DOA: 09/11/2018  PCP: [name], MD  Patient coming from: Home.  Chief Complaint: Shortness of breath.  HPI: [name] is a 72 y.o. female with history of COPD, severe pulmonary hypertension, diastolic CHF last EF measured in 2018 was 60 to 65% with grade 1 diastolic dysfunction presents with worsening shortness of breath cough unable to produce sputum with some wheezing over the last 24 hours.  Patient also had some transient chest pain lasted for few seconds yesterday.  Chest pain happened while patient was at rest.  Patient states she had subjective feeling of fever chills at home.  But did not record temperature.  ED Course: In the ER patient was found to be hypoxic and usually uses 3 L oxygen at home and required 5 L.  Chest x-ray shows edema BNP was 170.  Patient had leukocytosis of 16,000 and was started on Levaquin for possible pneumonia versus bronchitis influenza PCR is pending.  Patient admitted for acute respiratory failure with hypoxia likely from possible early bronchitis/pneumonia with possible CHF.  Review of Systems: As per HPI, rest all negative.   Past Medical History:  Diagnosis Date  . Abnormal LFTs 08/02/2011  . Acute liver failure 06/19/2013  . Acute renal failure ([place]) 06/19/2013  . Acute respiratory failure ([place]) 06/19/2013  . Altered mental status 08/01/2011  . Angina   . Anxiety   . Anxiety state, unspecified 12/03/2013  . Aortic stenosis    mild  . Arthritis    "hands" (02/03/2017)  . Breast cancer of upper-outer quadrant of left female breast ([place]) 08/21/2013  . Breast cancer, right breast ([place]) dx'd 2008  . CAD (coronary artery disease) of artery bypass graft 11/06/2013  . CAD (coronary artery disease), MI R/O 08/01/2011   PCI in 2006 (bare metal stent, unknown artery) - [place]   . CHF,  acute diastolic, BNP 4k on admissio 08/01/2011  . Chronic bronchitis ([place])   . Chronic  respiratory failure ([place]) 02/02/2012  . Compression fracture of L1 lumbar vertebra ([place]) 02/02/2012  . COPD (chronic obstructive pulmonary disease) (HCC)    oxygen-dependent 4LPM [place]  . Coronary artery disease 2006   2 stents w/previous MI  . Depressed   . Diastolic CHF ([place])   . Dyslipidemia   . Family history of adverse reaction to anesthesia    "daughter had c-section; missed twice w/epidural"  . GERD (gastroesophageal reflux disease)   . Heart murmur   . History of blood transfusion    "w/my colon OR"  . 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 History of nuclear stress test 08/03/2011   attenuation at apex - no perfusion defects   . HTN (hypertension) 11/06/2013  . Hyperkalemia, on ACE prior to admission 11/11/2011  . Hypertension   . Hyponatremia 01/31/2012  . Migraine    "qod to q couple months since I was 21" (02/03/2017)  . Mild aortic stenosis 08/01/2011   AVA 1.69 cm2 (06/02/2011)   . Moderate to severe pulmonary hypertension ([place])   . NSTEMI (non-ST elevated myocardial infarction) ([place]) 2006  . NSVT (nonsustained ventricular tachycardia) (HCC)    h/o  . On home oxygen therapy    "3L just at night; have it available prn duringtheday" (08/24/2017)  . Pneumonia    "alot of times" (02/03/2017)    Past Surgical History:  Procedure Laterality Date  . BREAST BIOPSY Right 2008  . BREAST LUMPECTOMY Right 2008  malignant  . CATARACT EXTRACTION W/ INTRAOCULAR LENS  IMPLANT, BILATERAL Bilateral ~ 2013  . [place]; 1971; 1978  . CHOLECYSTECTOMY OPEN  2011  . COLON SURGERY    . COLOSTOMY  11/2007  . COLOSTOMY CLOSURE  07/2008  . CORONARY ANGIOPLASTY WITH STENT PLACEMENT  2006   "2 stents"  . ERCP N/A 09/05/2015   Procedure: ENDOSCOPIC RETROGRADE CHOLANGIOPANCREATOGRAPHY (ERCP);  Surgeon: Ladene Artist, MD;  Location: [facility];  Service: Endoscopy;  Laterality: N/A;  . PARTIAL COLECTOMY  2009   for obstruction: temporary ostomy, later reversed.   [facility] RIGHT HEART  CATHETERIZATION N/A 09/05/2013   Procedure: RIGHT HEART CATH;  Surgeon: Jolaine Artist, MD;  Location: [facility] CATH LAB;  Service: Cardiovascular;  Laterality: N/A;  . TRANSTHORACIC ECHOCARDIOGRAM  11/12/2011   EF 45-40%, normal systolic function, grade 1 diastolic dysfunction; ventricular septal flattening (D-sign); mild AS; trace-mild MR; LA mildly dilated; RV mod dilated; RA mod dilated; severe pulm HTN; elevated CVP     reports that she has been smoking cigarettes. She has a 5.20 pack-year smoking history. She has never used smokeless tobacco. She reports current alcohol use of about 1.0 standard drinks of alcohol per week. She reports that she does not use drugs.  Allergies  Allergen Reactions  . Ceftriaxone Anaphylaxis and Other (See Comments)    *ROCEPHIN*  "Blow up like a balloon"  . Hydroxyzine Shortness Of Breath and Other (See Comments)    Pt states med make her light headed, get sob sxs  . Doxycycline Nausea And Vomiting  . Lexapro [Escitalopram] Other (See Comments)    Pt states med make her dizzy    Family History  Problem Relation Age of Onset  . Schizophrenia Sister   . Alzheimer's disease Mother   . Hyperlipidemia Brother   . Heart disease Sister   . Diabetes Sister   . Cancer Neg Hx   . Stroke Neg Hx   . COPD Neg Hx   . Depression Neg Hx   . Drug abuse Neg Hx   . Early death Neg Hx   . Hypertension Neg Hx   . Kidney disease Neg Hx     Prior to Admission medications   Medication Sig Start Date End Date Taking? Authorizing Provider  ALPRAZolam (XANAX) 1 MG tablet TAKE 1 TABLET BY MOUTH TWICE A DAY AS NEEDED Patient taking differently: Take 1 mg by mouth 2 (two) times daily as needed for anxiety.  08/27/18  Yes [name], MD  Artificial Tear Ointment (DRY EYES OP) Apply 2 drops to eye daily as needed (dry eyes).    Yes [provider]  aspirin 325 MG EC tablet Take 1 tablet (325 mg total) by mouth every morning. Patient taking differently: Take  325 mg by mouth daily.  09/21/15  Yes Velvet Bathe, MD  butalbital-acetaminophen-caffeine (FIORICET, ESGIC) 50-325-40 MG tablet TAKE 1 TO 2 TABLETS BY MOUTH AS NEEDED FOR HEADACHE Patient taking differently: Take 1 tablet by mouth every 4 (four) hours as needed for headache.  08/22/18  Yes [name], MD  Calcium Carbonate (CALCIUM 600 PO) Take 1 tablet by mouth daily.    Yes [provider]  cholecalciferol (VITAMIN D) 1000 units tablet Take 1,000 Units by mouth daily.   Yes [provider]  denosumab ([name]) 60 MG/ML SOLN injection Inject 60 mg into the skin every 6 (six) months. Administer in upper arm, thigh, or abdomen 09/09/17  Yes [name], MD  diclofenac sodium (VOLTAREN) 1 % GEL APPLY 4 GM TO AFFECTED AREA OF HANDS AS NEEDED Patient taking differently: Apply 4 g topically as needed (for pain).  07/05/18  Yes [name], MD  fluticasone (FLONASE) 50 MCG/ACT nasal spray USE 2 SPRAYS INTO EACH NOSTRIL ONCE DAILY**REPEAT FOR 5 DAYS THEN STOP Patient taking differently: Place 2 sprays into both nostrils daily as needed for allergies.  04/18/16  Yes [name], MD  furosemide (LASIX) 40 MG tablet TAKE 1 TABLET BY MOUTH TWICE A DAY Patient taking differently: Take 40 mg by mouth 2 (two) times daily.  07/02/18  Yes [name], MD  ipratropium (ATROVENT HFA) 17 MCG/ACT inhaler USE 2 PUFFS INTO THE LUNGS EVERY 4 HOURS AS NEEDED FOR SOB Patient taking differently: Inhale 2 puffs into the lungs every 4 (four) hours as needed for wheezing.  08/09/18  Yes [name], MD  ipratropium-albuterol (DUONEB) 0.5-2.5 (3) MG/3ML SOLN NEBULIZE 1 VIAL TWICE A DAY AS NEEDED Patient taking differently: Inhale 3 mLs into the lungs 2 (two) times daily as needed (for SOB).  03/26/18  Yes [name], MD  lactulose, encephalopathy, (CHRONULAC) 10 GM/15ML SOLN TAKE 30 MLS (6 TSP) BY MOUTH 3 TIMES A DAY AS NEEDED FOR CONSTIPATION Patient taking differently: Take 30 g by  mouth 3 (three) times daily as needed (for constipation).  08/09/18  Yes [name], MD  levocetirizine (XYZAL) 5 MG tablet TAKE 1 TABLET BY MOUTH EVERY EVENING Patient taking differently: Take 5 mg by mouth every evening.  04/23/18  Yes [name], MD  LIVALO 2 MG TABS TAKE 1 TABLET BY MOUTH EVERY DAY Patient taking differently: Take 2 mg by mouth daily.  07/27/18  Yes [name], MD  montelukast (SINGULAIR) 10 MG tablet TAKE 1 TABLET BY MOUTH EVERYDAY AT BEDTIME Patient taking differently: Take 10 mg by mouth at bedtime.  06/11/18  Yes [name], MD  Multiple Vitamin (MULTIVITAMIN) tablet Take 1 tablet by mouth daily.   Yes [provider]  nortriptyline (PAMELOR) 25 MG capsule TAKE 2 CAPSULES BY MOUTH AT BEDTIME Patient taking differently: Take 50 mg by mouth at bedtime.  07/27/18  Yes [name], MD  Omega 3 1200 MG CAPS Take 1,200 mg by mouth every morning.    Yes [provider]  pantoprazole (PROTONIX) 40 MG tablet TAKE 1 TABLET BY MOUTH EVERY DAY Patient taking differently: Take 40 mg by mouth daily.  09/06/18  Yes [name], MD  potassium chloride SA (KLOR-CON M20) 20 MEQ tablet Take 1 tablet (20 mEq total) by mouth daily. 06/30/18  Yes [name], MD  SYMBICORT 160-4.5 MCG/ACT inhaler INHALE 2 PUFFS INTO THE LUNGS TWICE A DAY Patient taking differently: Inhale 2 puffs into the lungs 2 (two) times daily.  08/09/18  Yes [name], MD  VENTOLIN HFA 108 (90 Base) MCG/ACT inhaler INHALE 2 PUFFS BY MOUTH EVERY 4 HOURS AS NEEDED Patient taking differently: Inhale 2 puffs into the lungs every 4 (four) hours as needed for wheezing.  07/19/18  Yes [name], MD  vitamin C (ASCORBIC ACID) 500 MG tablet Take 500 mg by mouth daily.   Yes [provider]  butalbital-acetaminophen-caffeine (FIORICET, ESGIC) 50-325-40 MG tablet TAKE 1 TO 2 TABLETS BY MOUTH AS NEEDED FOR HEADACHE Patient not taking: Reported on 09/11/2018 03/31/17    [name], MD  ondansetron (ZOFRAN) 8 MG tablet TAKE 1 TABLET BY MOUTH EVERY  8 HOURS AS NEEDED FOR NAUSEA Patient not taking: Reported on 09/11/2018 07/23/18   [name], MD  OXYGEN-HELIUM IN Inhale 3 L into the lungs See admin instructions. Uses when needed during the day, uses continuous throughout the night    [provider]  [name] 60 MG/ML SOSY injection INJECT 60MG  SUBCUTANEOUSLY EVERY 6 MONTHS IN THE UPPER ARM, THIGH OR ABDOMEN Patient not taking: Reported on 09/11/2018 08/29/18   [name], MD    Physical Exam: Vitals:   09/11/18 0230 09/11/18 0315 09/11/18 0330 09/11/18 0415  BP: (!) 104/48 (!) 105/49 (!) 102/50 (!) 105/45  Pulse: 95 96 95 92  Resp: (!) 25 (!) 22 (!) 23 (!) 22  Temp:      TempSrc:      SpO2: (!) 89% (!) 88% 92% (!) 89%  Weight:      Height:          Constitutional: Moderately built and nourished. Vitals:   09/11/18 0230 09/11/18 0315 09/11/18 0330 09/11/18 0415  BP: (!) 104/48 (!) 105/49 (!) 102/50 (!) 105/45  Pulse: 95 96 95 92  Resp: (!) 25 (!) 22 (!) 23 (!) 22  Temp:      TempSrc:      SpO2: (!) 89% (!) 88% 92% (!) 89%  Weight:      Height:       Eyes: Anicteric no pallor. ENMT: No discharge from the ears eyes nose or mouth. Neck: No mass felt.  No neck rigidity but no JVD appreciated. Respiratory: Coarse crepitations no rhonchi. Cardiovascular: S1-S2 heard no murmurs appreciated. Abdomen: Soft nontender bowel sounds present. Musculoskeletal: No edema.  No joint effusion. Skin: No rash.  Skin appears warm. Neurologic: Alert awake oriented to time place and person.  Moves all extremities. Psychiatric: Appears normal.  Normal affect.     Labs on Admission: I have personally reviewed following labs and imaging studies  CBC: Recent Labs  Lab 09/11/18 0128  WBC 16.1*  HGB 14.1  HCT 46.8*  MCV 100.2*  PLT 546   Basic Metabolic Panel: Recent Labs  Lab 09/11/18 0128  NA 138  K 3.5  CL 95*  CO2 29    GLUCOSE 111*  BUN 9  CREATININE 1.14*  CALCIUM 9.0   GFR: Estimated Creatinine Clearance: 35.3 mL/min (A) (by C-G formula based on SCr of 1.14 mg/dL (H)). Liver Function Tests: No results for input(s): AST, ALT, ALKPHOS, BILITOT, PROT, ALBUMIN in the last 168 hours. No results for input(s): LIPASE, AMYLASE in the last 168 hours. No results for input(s): AMMONIA in the last 168 hours. Coagulation Profile: No results for input(s): INR, PROTIME in the last 168 hours. Cardiac Enzymes: No results for input(s): CKTOTAL, CKMB, CKMBINDEX, TROPONINI in the last 168 hours. BNP (last 3 results) No results for input(s): PROBNP in the last 8760 hours. HbA1C: No results for input(s): HGBA1C in the last 72 hours. CBG: No results for input(s): GLUCAP in the last 168 hours. Lipid Profile: No results for input(s): CHOL, HDL, LDLCALC, TRIG, CHOLHDL, LDLDIRECT in the last 72 hours. Thyroid Function Tests: No results for input(s): TSH, T4TOTAL, FREET4, T3FREE, THYROIDAB in the last 72 hours. Anemia Panel: No results for input(s): VITAMINB12, FOLATE, FERRITIN, TIBC, IRON, RETICCTPCT in the last 72 hours. Urine analysis:    Component Value Date/Time   COLORURINE YELLOW 12/19/2013 1127   APPEARANCEUR CLEAR 12/19/2013 1127   LABSPEC <=1.005 (A) 12/19/2013 1127   PHURINE 7.0 12/19/2013 1127   GLUCOSEU NEGATIVE 12/19/2013  [place] 12/19/2013 [place] 12/19/2013 1127   [place] 12/19/2013 1127   PROTEINUR >300 (A) 06/20/2013 0059   UROBILINOGEN 0.2 12/19/2013 1127   NITRITE NEGATIVE 12/19/2013 1127   LEUKOCYTESUR MODERATE (A) 12/19/2013 1127   Sepsis Labs: @LABRCNTIP (procalcitonin:4,lacticidven:4) )No results found for this or any previous visit (from the past 240 hour(s)).   Radiological Exams on Admission: Dg Chest 2 View  Result Date: 09/11/2018 CLINICAL DATA:  Cough and shortness of breath tonight. EXAM: CHEST - 2 VIEW COMPARISON:  AP radiograph  08/26/2017, most recent chest CT 01/01/2015 FINDINGS: Chronic cardiomegaly and hilar prominence. Unchanged mediastinal contours with aortic atherosclerosis. Progressive reticular and interstitial thickening from prior exam. Additional ill-defined opacities in the right hemithorax are likely related scarring. Right apical pleuroparenchymal scarring. No confluent airspace disease, pleural effusion, or pneumothorax. Skin fold projects over the left lateral chest. Surgical clips in the right axilla. IMPRESSION: Increased reticular opacities and interstitial thickening compared to prior exam 1 year ago. Differential considerations include pulmonary edema, interstitial lung disease, or combination thereof. Mild cardiomegaly is unchanged. Electronically Signed   By: Keith Rake M.D.   On: 09/11/2018 02:23    EKG: Independently reviewed.  Sinus tachycardia.  Assessment/Plan Principal Problem:   Acute respiratory failure with hypoxia (HCC) Active Problems:   Aortic valve stenosis   Chronic diastolic CHF (congestive heart failure) (HCC)   Breast cancer of upper-outer quadrant of left female breast ([place])   Chronic obstructive pulmonary disease (HCC)   Chronic respiratory failure with hypoxia (HCC)   HTN (hypertension)    1. Acute respiratory failure with hypoxia likely combination of CHF/pneumonia.  Patient has been placed on antibiotics and will dose Lasix based on the blood pressure trends as IV or p.o.  Check 2D echo given history of aortic stenosis to see if it is progressing. 2. Acute renal failure  -closely monitor metabolic panel. 3. COPD we will continue home inhalers. 4. Hyperlipidemia on statins. 5. Aortic valve stenosis was appearing to be mild during previous 2D echo will recheck.   DVT prophylaxis: Lovenox. Code Status: Full code. Family Communication: Discussed with patient. Disposition Plan: Home. Consults called: None. Admission status: Inpatient.   Rise Patience  MD [facility] Pager [phone].  If 7PM-7AM, please contact night-coverage [email or web address] Password [email or web address]  09/11/2018, 6:02 AM

## 2018-09-11 NOTE — ED Notes (Signed)
IV team at bedside 

## 2018-09-11 NOTE — Progress Notes (Signed)
PROGRESS NOTE  [name] MPN:[id number] DOB: 01/06/1946 DOA: 09/11/2018 PCP: [name], MD  HPI/Recap of past 64 hours: 72 year old female with past medical history of COPD with chronic respiratory failure on oxygen, 3 L at night plus severe pulmonary hypertension and diastolic CHF who presented to the emergency room on the early morning of 12/16 with shortness of breath and cough without sputum for the last 24 hours.  Some intermittent episodes of chest pain, but only while coughing.  In the emergency room, patient found to be hypoxic requiring 5 L nasal cannula.  Chest x-ray noted edema although BNP only at 170.  White blood cell count of 16,000.  Patient started on IV Levaquin.  Procalcitonin level elevated at 0.72.  Influenza titer negative.  Patient admitted to the hospitalist service.  Following admission, seen later that morning in the emergency room.  Patient states breathing is little bit better than when she first came in.  She still on 5 L nasal cannula.  Repeat echocardiogram done, results are pending.  Assessment/Plan: Principal Problem:   Acute on chronic respiratory failure with hypoxia (HCC) secondary to COPD with mild exacerbation, mild pneumonia and likely some component of acute on chronic diastolic heart failure: Although normalized BNP, edema seen on x-ray.  Mildly elevated procalcitonin level confirms infection.  Would continue antibiotics, nebulizers, additional oxygenation.  No evidence of sepsis.  Lactic acid level normal.  Blood pressures have been relatively soft with a systolic just above 008.  We will try some gentle Lasix holding if systolic blood pressure goes below 100.  Continue to monitor.  Awaiting echocardiogram.  Patient does have a history of aortic valve stenosis. Active Problems:   Aortic valve stenosis    HTN (hypertension)  Stage III chronic kidney disease: Appears to be at baseline.   Code Status: Full code as per patient  Family  Communication: Left message for family  Disposition Plan: Discharge once breathing stabilized with diuresis and treatment of infection, back to baseline   Consultants:  None  Procedures:  Echo done 12/16: Results pending  Antimicrobials:  IV Levaquin 12/16-present  DVT prophylaxis: Lovenox   Objective: Vitals:   09/11/18 1200 09/11/18 1343  BP: (!) 107/53 104/65  Pulse: 85 86  Resp: 19 20  Temp:  97.8 F (36.6 C)  SpO2: 98% 99%    Intake/Output Summary (Last 24 hours) at 09/11/2018 1623 Last data filed at 09/11/2018 1251 Gross per 24 hour  Intake -  Output 200 ml  Net -200 ml   Filed Weights   09/11/18 0124  Weight: 54.4 kg   Body mass index is 21.95 kg/m.  Exam:   General: Alert and oriented x3, no acute distress  HEENT: Normocephalic and atraumatic, mucous membranes are slightly dry  Neck: Supple, no JVD  Cardiovascular: Regular rate and rhythm, Q7-Y1, 2/6 systolic ejection murmur  Respiratory: Prolonged expiratory wheeze bilaterally, few rhonchi  Abdomen: Soft, nontender, nondistended, positive bowel sounds  Musculoskeletal: No clubbing or cyanosis, trace pitting edema  Skin: No skin breaks, tears or lesions  Neurology: No focal deficits  Psychiatry: Appropriate, no evidence of psychoses   Data Reviewed: CBC: Recent Labs  Lab 09/11/18 0128 09/11/18 0610  WBC 16.1* 16.4*  HGB 14.1 12.6  HCT 46.8* 42.3  MCV 100.2* 101.2*  PLT 225 950   Basic Metabolic Panel: Recent Labs  Lab 09/11/18 0128 09/11/18 0610  NA 138  --   K 3.5  --   CL 95*  --  CO2 29  --   GLUCOSE 111*  --   BUN 9  --   CREATININE 1.14* 1.15*  CALCIUM 9.0  --   MG  --  1.9   GFR: Estimated Creatinine Clearance: 35 mL/min (A) (by C-G formula based on SCr of 1.15 mg/dL (H)). Liver Function Tests: No results for input(s): AST, ALT, ALKPHOS, BILITOT, PROT, ALBUMIN in the last 168 hours. No results for input(s): LIPASE, AMYLASE in the last 168 hours. No  results for input(s): AMMONIA in the last 168 hours. Coagulation Profile: No results for input(s): INR, PROTIME in the last 168 hours. Cardiac Enzymes: Recent Labs  Lab 09/11/18 0610 09/11/18 1221  TROPONINI <0.03 <0.03   BNP (last 3 results) No results for input(s): PROBNP in the last 8760 hours. HbA1C: No results for input(s): HGBA1C in the last 72 hours. CBG: No results for input(s): GLUCAP in the last 168 hours. Lipid Profile: No results for input(s): CHOL, HDL, LDLCALC, TRIG, CHOLHDL, LDLDIRECT in the last 72 hours. Thyroid Function Tests: Recent Labs    09/11/18 0610  TSH 0.341*   Anemia Panel: No results for input(s): VITAMINB12, FOLATE, FERRITIN, TIBC, IRON, RETICCTPCT in the last 72 hours. Urine analysis:    Component Value Date/Time   COLORURINE YELLOW 12/19/2013 1127   APPEARANCEUR CLEAR 12/19/2013 1127   LABSPEC <=1.005 (A) 12/19/2013 1127   PHURINE 7.0 12/19/2013 1127   GLUCOSEU NEGATIVE 12/19/2013 1127   HGBUR NEGATIVE 12/19/2013 1127   BILIRUBINUR NEGATIVE 12/19/2013 1127   KETONESUR NEGATIVE 12/19/2013 1127   PROTEINUR >300 (A) 06/20/2013 0059   UROBILINOGEN 0.2 12/19/2013 1127   NITRITE NEGATIVE 12/19/2013 1127   LEUKOCYTESUR MODERATE (A) 12/19/2013 1127   Sepsis Labs: @LABRCNTIP (procalcitonin:4,lacticidven:4)  )No results found for this or any previous visit (from the past 240 hour(s)).    Studies: Dg Chest 2 View  Result Date: 09/11/2018 CLINICAL DATA:  Cough and shortness of breath tonight. EXAM: CHEST - 2 VIEW COMPARISON:  AP radiograph 08/26/2017, most recent chest CT 01/01/2015 FINDINGS: Chronic cardiomegaly and hilar prominence. Unchanged mediastinal contours with aortic atherosclerosis. Progressive reticular and interstitial thickening from prior exam. Additional ill-defined opacities in the right hemithorax are likely related scarring. Right apical pleuroparenchymal scarring. No confluent airspace disease, pleural effusion, or  pneumothorax. Skin fold projects over the left lateral chest. Surgical clips in the right axilla. IMPRESSION: Increased reticular opacities and interstitial thickening compared to prior exam 1 year ago. Differential considerations include pulmonary edema, interstitial lung disease, or combination thereof. Mild cardiomegaly is unchanged. Electronically Signed   By: Keith Rake M.D.   On: 09/11/2018 02:23    Scheduled Meds: . aspirin  325 mg Oral Daily  . enoxaparin (LOVENOX) injection  40 mg Subcutaneous Q24H  . furosemide  40 mg Intravenous Q12H  . loratadine  10 mg Oral QPM  . mometasone-formoterol  2 puff Inhalation BID  . montelukast  10 mg Oral QHS  . nortriptyline  50 mg Oral QHS  . omega-3 acid ethyl esters  1,000 mg Oral q morning - 10a  . pantoprazole  40 mg Oral Daily  . potassium chloride SA  20 mEq Oral Daily  . pravastatin  40 mg Oral q1800  . vitamin C  500 mg Oral Daily    Continuous Infusions: . [START ON 09/12/2018] levofloxacin (LEVAQUIN) IV       LOS: 0 days     Annita Brod, MD [facility]  To reach me or the doctor on call, go to: [email or web address]  Password [email or web address]  09/11/2018, 4:23 PM

## 2018-09-11 NOTE — Progress Notes (Signed)
09/11/2018 Patient came from the Emergency to 2w at 1339. She is alert, orient and ambulatory. She was placed on telemetry monitor. Patient have bruise on left arm, and skin is intact.Rico Sheehan RN

## 2018-09-11 NOTE — Progress Notes (Signed)
Pharmacy Antibiotic Note  [name] is a 72 y.o. female admitted on 09/11/2018 with pneumonia.  Pharmacy has been consulted for Levaquin dosing.  Crcl ~35, we will change the dose slightlty  Plan:  Change levaquin to 250mg  IV q24  Height: 5\' 2"  (157.5 cm) Weight: 120 lb (54.4 kg) IBW/kg (Calculated) : 50.1  Temp (24hrs), Avg:97.8 F (36.6 C), Min:97.8 F (36.6 C), Max:97.8 F (36.6 C)  Recent Labs  Lab 09/11/18 0128 09/11/18 0610  WBC 16.1* 16.4*  CREATININE 1.14* 1.15*    Estimated Creatinine Clearance: 35 mL/min (A) (by C-G formula based on SCr of 1.15 mg/dL (H)).    Allergies  Allergen Reactions  . Ceftriaxone Anaphylaxis and Other (See Comments)    *ROCEPHIN*  "Blow up like a balloon"  . Hydroxyzine Shortness Of Breath and Other (See Comments)    Pt states med make her light headed, get sob sxs  . Doxycycline Nausea And Vomiting  . Lexapro [Escitalopram] Other (See Comments)    Pt states med make her dizzy     Onnie Boer, PharmD, BCIDP, AAHIVP, CPP Infectious Disease Pharmacist 09/11/2018 1:43 PM

## 2018-09-11 NOTE — ED Triage Notes (Addendum)
Pt from home c/o sob x 1 day worsening in the past few hours. Hx of COPD, CHF, and pneumonia; has oxygen at home to use as needed and at night time; has been using 3L [place] at home. Initial sats 82% with wheezing in upper lobes and diminished in lower lobes. Also reports productive cough with clear sputum. EMS gave albuterol treatments x2, then duoneb, and 125mg  solumedrol. Pt reports she was told by dispatch to chew a 325mg  ASA. C/o headache and sob

## 2018-09-11 NOTE — ED Provider Notes (Signed)
[facility] EMERGENCY DEPARTMENT Provider Note   CSN: [id number] Arrival date & time: 09/11/18  0112     History   Chief Complaint Chief Complaint  Patient presents with  . Shortness of Breath    HPI [name] is a 72 y.o. female.  The history is provided by the patient and medical records.  Shortness of Breath  Associated symptoms include cough.     72 year old female with history of anxiety, aortic stenosis, history of breast cancer, coronary artery disease, CHF, chronic respiratory failure, depression, COPD, GERD, hypertension, presenting to the ED for shortness of breath.  Patient reports all day today she has been feeling poorly, woke up feeling this way.  Says she has had increased shortness of breath above her baseline.  She does have home oxygen that she ordinarily only uses at night, 3 L at a time.  States she is had to use her oxygen pretty much all day today.  States she has been doing nebs at home and received 3 neb treatments with EMS as well as IV Solu-Medrol without much improvement.  On arrival her O2 sats are 82% on 3 L, improved to about 89% on 4L.  She does report ongoing cough with clear mucous which is fairly normal for her.  Does report chills today.  She has been around her grandchildren who have had some cold-like symptoms.  She did receive a flu vaccine this year.  Past Medical History:  Diagnosis Date  . Abnormal LFTs 08/02/2011  . Acute liver failure 06/19/2013  . Acute renal failure ([place]) 06/19/2013  . Acute respiratory failure ([place]) 06/19/2013  . Altered mental status 08/01/2011  . Angina   . Anxiety   . Anxiety state, unspecified 12/03/2013  . Aortic stenosis    mild  . Arthritis    "hands" (02/03/2017)  . Breast cancer of upper-outer quadrant of left female breast ([place]) 08/21/2013  . Breast cancer, right breast ([place]) dx'd 2008  . CAD (coronary artery disease) of artery bypass graft 11/06/2013  . CAD (coronary artery disease),  MI R/O 08/01/2011   PCI in 2006 (bare metal stent, unknown artery) - [place]   . CHF,  acute diastolic, BNP 4k on admissio 08/01/2011  . Chronic bronchitis ([place])   . Chronic respiratory failure ([place]) 02/02/2012  . Compression fracture of L1 lumbar vertebra ([place]) 02/02/2012  . COPD (chronic obstructive pulmonary disease) (HCC)    oxygen-dependent 4LPM [place]  . Coronary artery disease 2006   2 stents w/previous MI  . Depressed   . Diastolic CHF ([place])   . Dyslipidemia   . Family history of adverse reaction to anesthesia    "daughter had c-section; missed twice w/epidural"  . GERD (gastroesophageal reflux disease)   . Heart murmur   . History of blood transfusion    "w/my colon OR"  . 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 History of nuclear stress test 08/03/2011   attenuation at apex - no perfusion defects   . HTN (hypertension) 11/06/2013  . Hyperkalemia, on ACE prior to admission 11/11/2011  . Hypertension   . Hyponatremia 01/31/2012  . Migraine    "qod to q couple months since I was 21" (02/03/2017)  . Mild aortic stenosis 08/01/2011   AVA 1.69 cm2 (06/02/2011)   . Moderate to severe pulmonary hypertension ([place])   . NSTEMI (non-ST elevated myocardial infarction) ([place]) 2006  . NSVT (nonsustained ventricular tachycardia) (HCC)    h/o  . On home oxygen therapy    "  3L just at night; have it available prn duringtheday" (08/24/2017)  . Pneumonia    "alot of times" (02/03/2017)    Patient Active Problem List   Diagnosis Date Noted  . Chronic obstructive pulmonary disease ([place]) 08/24/2017  . Chronic respiratory failure with hypoxia ([place]) 08/24/2017  . Anxiety 08/24/2017  . Insomnia due to anxiety and fear 08/22/2017  . Oxygen dependent 08/16/2017  . Hyperlipidemia LDL goal <100 02/03/2017  . Mixed hyperlipidemia 03/31/2016  . Other emphysema ([place]) 06/14/2014  . Coronary artery disease involving native coronary artery of native heart without angina pectoris 11/06/2013  . HTN (hypertension)  11/06/2013  . Breast cancer of upper-outer quadrant of left female breast ([place]) 08/21/2013  . Osteoporosis 01/01/2013  . Chronic diastolic CHF (congestive heart failure) ([place]) 01/31/2012  . Aortic valve stenosis 08/01/2011    Class: Chronic    Past Surgical History:  Procedure Laterality Date  . BREAST BIOPSY Right 2008  . BREAST LUMPECTOMY Right 2008   malignant  . CATARACT EXTRACTION W/ INTRAOCULAR LENS  IMPLANT, BILATERAL Bilateral ~ 2013  . [place]; 1971; 1978  . CHOLECYSTECTOMY OPEN  2011  . COLON SURGERY    . COLOSTOMY  11/2007  . COLOSTOMY CLOSURE  07/2008  . CORONARY ANGIOPLASTY WITH STENT PLACEMENT  2006   "2 stents"  . ERCP N/A 09/05/2015   Procedure: ENDOSCOPIC RETROGRADE CHOLANGIOPANCREATOGRAPHY (ERCP);  Surgeon: Ladene Artist, MD;  Location: [facility];  Service: Endoscopy;  Laterality: N/A;  . PARTIAL COLECTOMY  2009   for obstruction: temporary ostomy, later reversed.   Marland Kitchen RIGHT HEART CATHETERIZATION N/A 09/05/2013   Procedure: RIGHT HEART CATH;  Surgeon: Jolaine Artist, MD;  Location: [facility] C[facility];  Service: Cardiovascular;  Laterality: N/A;  . TRANSTHORACIC ECHOCARDIOGRAM  11/12/2011   EF 20-94%, normal systolic function, grade 1 diastolic dysfunction; ventricular septal flattening (D-sign); mild AS; trace-mild MR; LA mildly dilated; RV mod dilated; RA mod dilated; severe pulm HTN; elevated CVP     OB History   No obstetric history on file.      Home Medications    Prior to Admission medications   Medication Sig Start Date End Date Taking? Authorizing Provider  ALPRAZolam Duanne Moron) 1 MG tablet TAKE 1 TABLET BY MOUTH TWICE A DAY AS NEEDED 08/27/18   [name], MD  Artificial Tear Ointment (DRY EYES OP) Apply 2 drops to eye daily as needed (dry eyes).     [provider]  aspirin 325 MG EC tablet Take 1 tablet (325 mg total) by mouth every morning. 09/21/15   Velvet Bathe, MD  butalbital-acetaminophen-caffeine (FIORICET,  ESGIC) [phone] MG tablet TAKE 1 TO 2 TABLETS BY MOUTH AS NEEDED FOR HEADACHE 03/31/17   [name], MD  butalbital-acetaminophen-caffeine (FIORICET, ESGIC) [phone] MG tablet TAKE 1 TO 2 TABLETS BY MOUTH AS NEEDED FOR HEADACHE 08/22/18   [name], MD  Calcium Carbonate (CALCIUM 600 PO) Take 1 tablet by mouth daily.     [provider]  cholecalciferol (VITAMIN D) 1000 units tablet Take 1,000 Units by mouth daily.    [provider]  denosumab ([name]) 60 MG/ML SOLN injection Inject 60 mg into the skin every 6 (six) months. Administer in upper arm, thigh, or abdomen 09/09/17   [name], MD  diclofenac sodium (VOLTAREN) 1 % GEL APPLY 4 GM TO AFFECTED AREA OF HANDS AS NEEDED 07/05/18   [name], MD  fluticasone (FLONASE) 50 MCG/ACT nasal spray USE 2 SPRAYS  INTO EACH NOSTRIL ONCE DAILY**REPEAT FOR 5 DAYS THEN STOP Patient taking differently: USE 2 SPRAYS INTO EACH NOSTRIL ONCE DAILY AS NEEDED FOR ALLERGIES **REPEAT FOR 5 DAYS THEN STOP 04/18/16   [name], MD  furosemide (LASIX) 40 MG tablet TAKE 1 TABLET BY MOUTH TWICE A DAY 07/02/18   [name], MD  ipratropium (ATROVENT HFA) 17 MCG/ACT inhaler USE 2 PUFFS INTO THE LUNGS EVERY 4 HOURS AS NEEDED FOR SOB 08/09/18   [name], MD  ipratropium-albuterol (DUONEB) 0.5-2.5 (3) MG/3ML SOLN NEBULIZE 1 VIAL TWICE A DAY AS NEEDED 03/26/18   [name], MD  lactulose, encephalopathy, (CHRONULAC) 10 GM/15ML SOLN TAKE 30 MLS (6 TSP) BY MOUTH 3 TIMES A DAY AS NEEDED FOR CONSTIPATION 08/09/18   [name], MD  levocetirizine (XYZAL) 5 MG tablet TAKE 1 TABLET BY MOUTH EVERY EVENING 04/23/18   [name], MD  LIVALO 2 MG TABS TAKE 1 TABLET BY MOUTH EVERY DAY 07/27/18   [name], MD  montelukast (SINGULAIR) 10 MG tablet TAKE 1 TABLET BY MOUTH EVERYDAY AT BEDTIME 06/11/18   [name], MD  Multiple Vitamin (MULTIVITAMIN) tablet Take 1 tablet by mouth daily.    [provider]    nortriptyline (PAMELOR) 25 MG capsule TAKE 2 CAPSULES BY MOUTH AT BEDTIME 07/27/18   [name], MD  Omega 3 1200 MG CAPS Take 1,200 mg by mouth every morning.     [provider]  ondansetron (ZOFRAN) 8 MG tablet TAKE 1 TABLET BY MOUTH EVERY 8 HOURS AS NEEDED FOR NAUSEA 07/23/18   [name], MD  OXYGEN-HELIUM IN Inhale 3 L into the lungs See admin instructions. Uses when needed during the day, uses continuous throughout the night    [provider]  pantoprazole (PROTONIX) 40 MG tablet TAKE 1 TABLET BY MOUTH EVERY DAY 09/06/18   [name], MD  potassium chloride SA (KLOR-CON M20) 20 MEQ tablet Take 1 tablet (20 mEq total) by mouth daily. 06/30/18   [name], MD  [name] 60 MG/ML SOSY injection INJECT 60MG  SUBCUTANEOUSLY EVERY 6 MONTHS IN THE UPPER ARM, THIGH OR ABDOMEN 08/29/18   [name], MD  SYMBICORT 160-4.5 MCG/ACT inhaler INHALE 2 PUFFS INTO THE LUNGS TWICE A DAY 08/09/18   [name], MD  VENTOLIN HFA 108 (90 Base) MCG/ACT inhaler INHALE 2 PUFFS BY MOUTH EVERY 4 HOURS AS NEEDED 07/19/18   [name], MD  vitamin C (ASCORBIC ACID) 500 MG tablet Take 500 mg by mouth daily.    [provider]    Family History Family History  Problem Relation Age of Onset  . Schizophrenia Sister   . Alzheimer's disease Mother   . Hyperlipidemia Brother   . Heart disease Sister   . Diabetes Sister   . Cancer Neg Hx   . Stroke Neg Hx   . COPD Neg Hx   . Depression Neg Hx   . Drug abuse Neg Hx   . Early death Neg Hx   . Hypertension Neg Hx   . Kidney disease Neg Hx     Social History Social History   Tobacco Use  . Smoking status: Current Some Day Smoker    Packs/day: 0.10    Years: 52.00    Pack years: 5.20    Types: Cigarettes  . Smokeless tobacco: Never Used  Substance Use Topics  . Alcohol use: Yes    Alcohol/week: 1.0 standard drinks  Types: 1 Glasses of wine per week  . Drug use: No     Allergies    Ceftriaxone; Hydroxyzine; Doxycycline; and Lexapro [escitalopram]   Review of Systems Review of Systems  Constitutional: Positive for chills.  Respiratory: Positive for cough and shortness of breath.   All other systems reviewed and are negative.    Physical Exam Updated Vital Signs BP 97/86   Pulse (!) 101   Temp 97.8 F (36.6 C) (Oral)   Resp (!) 26   Ht 5\' 2"  (1.575 m)   Wt 54.4 kg   SpO2 (!) 88%   BMI 21.95 kg/m   Physical Exam Vitals signs and nursing note reviewed.  Constitutional:      Appearance: She is well-developed.  HENT:     Head: Normocephalic and atraumatic.  Eyes:     Conjunctiva/sclera: Conjunctivae normal.     Pupils: Pupils are equal, round, and reactive to light.  Neck:     Musculoskeletal: Normal range of motion.  Cardiovascular:     Rate and Rhythm: Normal rate and regular rhythm.     Heart sounds: Normal heart sounds.  Pulmonary:     Effort: Pulmonary effort is normal.     Breath sounds: Wheezing and rhonchi present. No decreased breath sounds.     Comments: Harsh, wet cough, able to speak in sentences but stops several times to cough between words Abdominal:     General: Bowel sounds are normal.     Palpations: Abdomen is soft.  Musculoskeletal: Normal range of motion.     Comments: No significant peripheral edema  Skin:    General: Skin is warm and dry.  Neurological:     Mental Status: She is alert and oriented to person, place, and time.      ED Treatments / Results  Labs (all labs ordered are listed, but only abnormal results are displayed) Labs Reviewed  BASIC METABOLIC PANEL - Abnormal; Notable for the following components:      Result Value   Chloride 95 (*)    Glucose, Bld 111 (*)    Creatinine, Ser 1.14 (*)    GFR calc non Af Amer 48 (*)    GFR calc Af Amer 56 (*)    All other components within normal limits  CBC - Abnormal; Notable for the following components:   WBC 16.1 (*)    HCT 46.8 (*)    MCV 100.2 (*)     All other components within normal limits  BRAIN NATRIURETIC PEPTIDE - Abnormal; Notable for the following components:   B Natriuretic Peptide 170.3 (*)    All other components within normal limits  INFLUENZA PANEL BY PCR (TYPE A & B)  I-STAT TROPONIN, ED    EKG None  Radiology Dg Chest 2 View  Result Date: 09/11/2018 CLINICAL DATA:  Cough and shortness of breath tonight. EXAM: CHEST - 2 VIEW COMPARISON:  AP radiograph 08/26/2017, most recent chest CT 01/01/2015 FINDINGS: Chronic cardiomegaly and hilar prominence. Unchanged mediastinal contours with aortic atherosclerosis. Progressive reticular and interstitial thickening from prior exam. Additional ill-defined opacities in the right hemithorax are likely related scarring. Right apical pleuroparenchymal scarring. No confluent airspace disease, pleural effusion, or pneumothorax. Skin fold projects over the left lateral chest. Surgical clips in the right axilla. IMPRESSION: Increased reticular opacities and interstitial thickening compared to prior exam 1 year ago. Differential considerations include pulmonary edema, interstitial lung disease, or combination thereof. Mild cardiomegaly is unchanged. Electronically Signed   By: Keith Rake  M.D.   On: 09/11/2018 02:23    Procedures Procedures (including critical care time)  Medications Ordered in ED Medications  levofloxacin (LEVAQUIN) IVPB 750 mg (has no administration in time range)  albuterol (PROVENTIL) (2.5 MG/3ML) 0.083% nebulizer solution 5 mg (5 mg Nebulization Given 09/11/18 0246)  ipratropium (ATROVENT) nebulizer solution 0.5 mg (0.5 mg Nebulization Given 09/11/18 0246)     Initial Impression / Assessment and Plan / ED Course  I have reviewed the triage vital signs and the nursing notes.  Pertinent labs & imaging results that were available during my care of the patient were reviewed by me and considered in my medical decision making (see chart for details).  72 y.o. F  here with SOB.  Started feeling bad early this morning, worsening throughout the day.  She has oxygen at home, usually only nighttime use but has been using all day today.  On arrival her O2 sats are 82% on her baseline 3 L.  She was increased to 4 with some improvement to the upper 80s.  She has a harsh, wet cough with intermixed wheezes and rhonchi throughout.  She is able to talk in sentences but stop several times due to coughing fits.  Does report being around her grandchildren recently who have had colds.  Concern for development of pneumonia.  Labs and chest x-ray pending.  Will also send flu swab.  She is already received Solu-Medrol with EMS, will give additional nebs.  3:40 AM On re-check patient reports she feels about the same after additional nebs.  I have reviewed her results.  Labs with leukocytosis of 16,000.  BNP 170, however patient does not appear significantly fluid overloaded.  Flu pending.  CXR with concerning findings for CHF, however on my review of films this seems very asymmetric.  Given her symptoms and elevated WBC count, I have concern for CAP.  Patient states this is generally how she feels when she gets pneumonia.  She is now requiring 5L, sats still in the low 90's.  She will require admission.  Discussed with Dr. Hal Hope-- he will evaluate and admit for ongoing care.  Final Clinical Impressions(s) / ED Diagnoses   Final diagnoses:  Community acquired pneumonia, unspecified laterality  Acute on chronic respiratory failure with hypoxia [facility])    ED Discharge Orders    None       Larene Pickett, PA-C 09/11/18 [place], Ankit, MD 09/15/18 [phone]

## 2018-09-11 NOTE — ED Notes (Signed)
Pt provided water, ok per hospitalist.

## 2018-09-11 NOTE — ED Notes (Signed)
Neb tx completed.

## 2018-09-11 NOTE — Progress Notes (Signed)
Pharmacy Antibiotic Note  [name] is a 72 y.o. female admitted on 09/11/2018 with pneumonia.  Pharmacy has been consulted for Levaquin dosing.  Plan: Levaquin 750mg  IV x1 then 500mg  IV Q24H.  Height: 5\' 2"  (157.5 cm) Weight: 120 lb (54.4 kg) IBW/kg (Calculated) : 50.1  Temp (24hrs), Avg:97.8 F (36.6 C), Min:97.8 F (36.6 C), Max:97.8 F (36.6 C)  Recent Labs  Lab 09/11/18 0128  WBC 16.1*  CREATININE 1.14*    Estimated Creatinine Clearance: 35.3 mL/min (A) (by C-G formula based on SCr of 1.14 mg/dL (H)).    Allergies  Allergen Reactions  . Ceftriaxone Anaphylaxis and Other (See Comments)    *ROCEPHIN*  "Blow up like a balloon"  . Hydroxyzine Shortness Of Breath and Other (See Comments)    Pt states med make her light headed, get sob sxs  . Doxycycline Nausea And Vomiting  . Lexapro [Escitalopram] Other (See Comments)    Pt states med make her dizzy     Thank you for allowing pharmacy to be a part of this patient's care.  Wynona Neat, PharmD, BCPS  09/11/2018 6:53 AM

## 2018-09-11 NOTE — ED Notes (Signed)
Neb tx started, flu swab collected, pt tolerated well

## 2018-09-11 NOTE — Procedures (Signed)
Echo attempted in ED. Pt in transit to 2W room.

## 2018-09-11 NOTE — Progress Notes (Signed)
  Echocardiogram 2D Echocardiogram has been performed.  [name] 09/11/2018, 4:02 PM

## 2018-09-12 DIAGNOSIS — J9621 Acute and chronic respiratory failure with hypoxia: Secondary | ICD-10-CM | POA: Diagnosis not present

## 2018-09-12 DIAGNOSIS — J9601 Acute respiratory failure with hypoxia: Secondary | ICD-10-CM | POA: Diagnosis not present

## 2018-09-12 LAB — CBC
HCT: 40.8 % (ref 36.0–46.0)
Hemoglobin: 12 g/dL (ref 12.0–15.0)
MCH: 29.5 pg (ref 26.0–34.0)
MCHC: 29.4 g/dL — ABNORMAL LOW (ref 30.0–36.0)
MCV: 100.2 fL — ABNORMAL HIGH (ref 80.0–100.0)
Platelets: 186 10*3/uL (ref 150–400)
RBC: 4.07 MIL/uL (ref 3.87–5.11)
RDW: 12.8 % (ref 11.5–15.5)
WBC: 9.1 10*3/uL (ref 4.0–10.5)
nRBC: 0 % (ref 0.0–0.2)

## 2018-09-12 LAB — BASIC METABOLIC PANEL
Anion gap: 10 (ref 5–15)
BUN: 14 mg/dL (ref 8–23)
CO2: 29 mmol/L (ref 22–32)
Calcium: 8.3 mg/dL — ABNORMAL LOW (ref 8.9–10.3)
Chloride: 98 mmol/L (ref 98–111)
Creatinine, Ser: 1.06 mg/dL — ABNORMAL HIGH (ref 0.44–1.00)
GFR calc Af Amer: >60 mL/min (ref >60)
GFR calc non Af Amer: 52 mL/min — ABNORMAL LOW (ref >60)
Glucose, Bld: 92 mg/dL (ref 70–99)
Potassium: 3.9 mmol/L (ref 3.5–5.1)
Sodium: 137 mmol/L (ref 135–145)

## 2018-09-12 MED ORDER — PREDNISONE 20 MG PO TABS: 40.0000 mg | ORAL_TABLET | Freq: Every day | ORAL | 0 refills | Status: AC

## 2018-09-12 MED ORDER — LEVOFLOXACIN 500 MG PO TABS: 500.0000 mg | ORAL_TABLET | Freq: Every day | ORAL | 0 refills | Status: AC

## 2018-09-12 NOTE — Discharge Summary (Signed)
Physician Discharge Summary  REA KALAMA QQI:[id number] DOB: 1946/08/01 DOA: 09/11/2018  PCP: [name], MD  Admit date: 09/11/2018 Discharge date: 09/12/2018  Admitted From: Home Disposition:  Home  Discharge Condition:Stable CODE STATUS:FULL Diet recommendation: Heart Healthy   Brief/Interim Summary: 72 year old female with past medical history of COPD with chronic respiratory failure on oxygen, 3 L at night plus severe pulmonary hypertension and diastolic CHF who presented to the emergency room on the early morning of 12/16 with shortness of breath and cough without sputum for the last 24 hours.  Some intermittent episodes of chest pain, but only while coughing.  In the emergency room, patient found to be hypoxic requiring 5 L nasal cannula.  Chest x-ray noted edema although BNP only at 170.  White blood cell count of 16,000.  Patient started on IV Levaquin.  Procalcitonin level elevated at 0.72.  Influenza titer negative.  Patient admitted to the hospitalist service.  Hospital Course: Her hospital course remained stable.  Patient's respiratory status continued to improve.  This morning she was on 3 L/min which is her baseline.  She does not have any peripheral edema.  I did not  appreciate any crackles on examination of the lungs but I heard mild expiratory wheezes. She is stable for discharge to home today.  I extensively counseled her for cessation of smoking.  She is to follow-up with cardiology and pulmonologist as an outpatient.  She prescribed antibiotics and prednisone on discharge.  She is to continue her home inhalers.  Follow problems were addressed during hospitalization:   Acute on chronic respiratory failure with hypoxia (HCC) secondary to COPD with mild exacerbation, mild pneumonia and likely some component of acute on chronic diastolic heart failure.low think although normalized BNP, edema seen on x-ray.  Mildly elevated procalcitonin level confirms infection.    No  evidence of sepsis.  Lactic acid level normal.  Echocardiogram done here showed ejection fraction of 94%, grade 1 diastolic function.   Aortic valve stenosis:Patient does have a history of aortic valve stenosis.  She needs to follow-up with her cardiologist as an outpatient   HTN (hypertension): Currently stable  Stage III chronic kidney disease: Appears to be at baseline.  Potential [employer]: Case manager made aware.  Discharge Diagnoses:  Principal Problem:   Acute respiratory failure with hypoxia (HCC) Active Problems:   Aortic valve stenosis   Chronic diastolic CHF (congestive heart failure) (HCC)   Breast cancer of upper-outer quadrant of left female breast ([place])   Chronic obstructive pulmonary disease (HCC)   Chronic respiratory failure with hypoxia (HCC)   HTN (hypertension)    Discharge Instructions  Discharge Instructions    Diet - low sodium heart healthy   Complete by:  As directed    Discharge instructions   Complete by:  As directed    1) Please stop smoking. 2)Take prescribed medications as instructed. 3) Follow up with your cardiologist and the pulmonologist. 4)Please restrict water intake to less than 2 liters and salt intake to less than 2 grams a day. 5)Continue your home medicatons.   Increase activity slowly   Complete by:  As directed      Allergies as of 09/12/2018      Reactions   Ceftriaxone Anaphylaxis, Other (See Comments)   *ROCEPHIN*  "Blow up like a balloon"   Hydroxyzine Shortness Of Breath, Other (See Comments)   Pt states med make her light headed, get sob sxs   Doxycycline Nausea And Vomiting   Lexapro [  escitalopram] Other (See Comments)   Pt states med make her dizzy      Medication List    TAKE these medications   ALPRAZolam 1 MG tablet Commonly known as:  XANAX TAKE 1 TABLET BY MOUTH TWICE A DAY AS NEEDED What changed:  reasons to take this   aspirin 325 MG EC tablet Take 1 tablet (325 mg total) by mouth every  morning. What changed:  when to take this   butalbital-acetaminophen-caffeine 50-325-40 MG tablet Commonly known as:  FIORICET, ESGIC TAKE 1 TO 2 TABLETS BY MOUTH AS NEEDED FOR HEADACHE What changed:    See the new instructions.  Another medication with the same name was removed. Continue taking this medication, and follow the directions you see here.   CALCIUM 600 PO Take 1 tablet by mouth daily.   cholecalciferol 1000 units tablet Commonly known as:  VITAMIN D Take 1,000 Units by mouth daily.   denosumab 60 MG/ML Soln injection Commonly known as:  [name] Inject 60 mg into the skin every 6 (six) months. Administer in upper arm, thigh, or abdomen   [name] 60 MG/ML Sosy injection Generic drug:  denosumab INJECT 60MG  SUBCUTANEOUSLY EVERY 6 MONTHS IN THE UPPER ARM, THIGH OR ABDOMEN   diclofenac sodium 1 % Gel Commonly known as:  VOLTAREN APPLY 4 GM TO AFFECTED AREA OF HANDS AS NEEDED What changed:    how much to take  how to take this  when to take this  reasons to take this  additional instructions   DRY EYES OP Apply 2 drops to eye daily as needed (dry eyes).   fluticasone 50 MCG/ACT nasal spray Commonly known as:  FLONASE USE 2 SPRAYS INTO EACH NOSTRIL ONCE DAILY**REPEAT FOR 5 DAYS THEN STOP What changed:  See the new instructions.   furosemide 40 MG tablet Commonly known as:  LASIX TAKE 1 TABLET BY MOUTH TWICE A DAY What changed:  when to take this   ipratropium 17 MCG/ACT inhaler Commonly known as:  ATROVENT HFA USE 2 PUFFS INTO THE LUNGS EVERY 4 HOURS AS NEEDED FOR SOB What changed:    how much to take  how to take this  when to take this  reasons to take this  additional instructions   ipratropium-albuterol 0.5-2.5 (3) MG/3ML Soln Commonly known as:  DUONEB NEBULIZE 1 VIAL TWICE A DAY AS NEEDED What changed:  See the new instructions.   lactulose (encephalopathy) 10 GM/15ML Soln Commonly known as:  CHRONULAC TAKE 30 MLS (6 TSP) BY MOUTH  3 TIMES A DAY AS NEEDED FOR CONSTIPATION What changed:  See the new instructions.   levocetirizine 5 MG tablet Commonly known as:  XYZAL TAKE 1 TABLET BY MOUTH EVERY EVENING   levofloxacin 500 MG tablet Commonly known as:  LEVAQUIN Take 1 tablet (500 mg total) by mouth daily for 3 days.   LIVALO 2 MG Tabs Generic drug:  Pitavastatin Calcium TAKE 1 TABLET BY MOUTH EVERY DAY What changed:  how much to take   montelukast 10 MG tablet Commonly known as:  SINGULAIR TAKE 1 TABLET BY MOUTH EVERYDAY AT BEDTIME What changed:  See the new instructions.   multivitamin tablet Take 1 tablet by mouth daily.   nortriptyline 25 MG capsule Commonly known as:  PAMELOR TAKE 2 CAPSULES BY MOUTH AT BEDTIME   Omega 3 1200 MG Caps Take 1,200 mg by mouth every morning.   ondansetron 8 MG tablet Commonly known as:  ZOFRAN TAKE 1 TABLET BY MOUTH EVERY 8  HOURS AS NEEDED FOR NAUSEA   OXYGEN Inhale 3 L into the lungs See admin instructions. Uses when needed during the day, uses continuous throughout the night   pantoprazole 40 MG tablet Commonly known as:  PROTONIX TAKE 1 TABLET BY MOUTH EVERY DAY   potassium chloride SA 20 MEQ tablet Commonly known as:  KLOR-CON M20 Take 1 tablet (20 mEq total) by mouth daily.   predniSONE 20 MG tablet Commonly known as:  DELTASONE Take 2 tablets (40 mg total) by mouth daily for 5 days.   SYMBICORT 160-4.5 MCG/ACT inhaler Generic drug:  budesonide-formoterol INHALE 2 PUFFS INTO THE LUNGS TWICE A DAY What changed:  See the new instructions.   VENTOLIN HFA 108 (90 Base) MCG/ACT inhaler Generic drug:  albuterol INHALE 2 PUFFS BY MOUTH EVERY 4 HOURS AS NEEDED What changed:  reasons to take this   vitamin C 500 MG tablet Commonly known as:  ASCORBIC ACID Take 500 mg by mouth daily.      Follow-up Information    Hilty, Nadean Corwin, MD. Schedule an appointment as soon as possible for a visit in 2 week(s).   Specialty:  Cardiology Contact  information: [address] [place] [address] [phone]        Juanito Doom, MD. Schedule an appointment as soon as possible for a visit in 2 week(s).   Specialty:  Pulmonary Disease Contact information: [place] [address] [phone]          Allergies  Allergen Reactions  . Ceftriaxone Anaphylaxis and Other (See Comments)    *ROCEPHIN*  "Blow up like a balloon"  . Hydroxyzine Shortness Of Breath and Other (See Comments)    Pt states med make her light headed, get sob sxs  . Doxycycline Nausea And Vomiting  . Lexapro [Escitalopram] Other (See Comments)    Pt states med make her dizzy    Consultations:  None   Procedures/Studies: Dg Chest 2 View  Result Date: 09/11/2018 CLINICAL DATA:  Cough and shortness of breath tonight. EXAM: CHEST - 2 VIEW COMPARISON:  AP radiograph 08/26/2017, most recent chest CT 01/01/2015 FINDINGS: Chronic cardiomegaly and hilar prominence. Unchanged mediastinal contours with aortic atherosclerosis. Progressive reticular and interstitial thickening from prior exam. Additional ill-defined opacities in the right hemithorax are likely related scarring. Right apical pleuroparenchymal scarring. No confluent airspace disease, pleural effusion, or pneumothorax. Skin fold projects over the left lateral chest. Surgical clips in the right axilla. IMPRESSION: Increased reticular opacities and interstitial thickening compared to prior exam 1 year ago. Differential considerations include pulmonary edema, interstitial lung disease, or combination thereof. Mild cardiomegaly is unchanged. Electronically Signed   By: Keith Rake M.D.   On: 09/11/2018 02:23       Subjective: Patient seen and examined at bedside this morning.  Currently she is on 3L/ per minute of oxygen. Her respiratory status has improved.  She is ambulating without problem.  Stable for discharge.  Discharge Exam: Vitals:    09/11/18 2250 09/12/18 0432  BP: 112/60 (!) 108/58  Pulse: 83   Resp: 18   Temp: 98.4 F (36.9 C) 98.3 F (36.8 C)  SpO2: 98% 97%   Vitals:   09/11/18 1709 09/11/18 2250 09/12/18 0422 09/12/18 0432  BP: 121/61 112/60  (!) 108/58  Pulse: 85 83    Resp:  18    Temp: 98 F (36.7 C) 98.4 F (36.9 C)  98.3 F (36.8 C)  TempSrc: Oral Oral  Oral  SpO2: 98% 98%  97%  Weight:   53.1 kg   Height:        General: Pt is alert, awake, not in acute distress Cardiovascular: RRR, S1/S2 +, no rubs, no gallops Respiratory: B/L mild expiratory wheezes Abdominal: Soft, NT, ND, bowel sounds + Extremities: no edema, no cyanosis    The results of significant diagnostics from this hospitalization (including imaging, microbiology, ancillary and laboratory) are listed below for reference.     Microbiology: No results found for this or any previous visit (from the past 240 hour(s)).   Labs: BNP (last 3 results) Recent Labs    09/11/18 0136  BNP 371.6*   Basic Metabolic Panel: Recent Labs  Lab 09/11/18 0128 09/11/18 0610 09/12/18 0236  NA 138  --  137  K 3.5  --  3.9  CL 95*  --  98  CO2 29  --  29  GLUCOSE 111*  --  92  BUN 9  --  14  CREATININE 1.14* 1.15* 1.06*  CALCIUM 9.0  --  8.3*  MG  --  1.9  --    Liver Function Tests: No results for input(s): AST, ALT, ALKPHOS, BILITOT, PROT, ALBUMIN in the last 168 hours. No results for input(s): LIPASE, AMYLASE in the last 168 hours. No results for input(s): AMMONIA in the last 168 hours. CBC: Recent Labs  Lab 09/11/18 0128 09/11/18 0610 09/12/18 0236  WBC 16.1* 16.4* 9.1  HGB 14.1 12.6 12.0  HCT 46.8* 42.3 40.8  MCV 100.2* 101.2* 100.2*  PLT 225 201 186   Cardiac Enzymes: Recent Labs  Lab 09/11/18 0610 09/11/18 1221 09/11/18 1825  TROPONINI <0.03 <0.03 <0.03   BNP: Invalid input(s): POCBNP CBG: No results for input(s): GLUCAP in the last 168 hours. D-Dimer Recent Labs    09/11/18 0743  DDIMER 1.05*   Hgb  A1c No results for input(s): HGBA1C in the last 72 hours. Lipid Profile No results for input(s): CHOL, HDL, LDLCALC, TRIG, CHOLHDL, LDLDIRECT in the last 72 hours. Thyroid function studies Recent Labs    09/11/18 0610  TSH 0.341*   Anemia work up No results for input(s): VITAMINB12, FOLATE, FERRITIN, TIBC, IRON, RETICCTPCT in the last 72 hours. Urinalysis    Component Value Date/Time   COLORURINE YELLOW 12/19/2013 1127   APPEARANCEUR CLEAR 12/19/2013 1127   LABSPEC <=1.005 (A) 12/19/2013 1127   PHURINE 7.0 12/19/2013 1127   GLUCOSEU NEGATIVE 12/19/2013 1127   HGBUR NEGATIVE 12/19/2013 1127   BILIRUBINUR NEGATIVE 12/19/2013 1127   KETONESUR NEGATIVE 12/19/2013 1127   PROTEINUR >300 (A) 06/20/2013 0059   UROBILINOGEN 0.2 12/19/2013 1127   NITRITE NEGATIVE 12/19/2013 1127   LEUKOCYTESUR MODERATE (A) 12/19/2013 1127   Sepsis Labs Invalid input(s): PROCALCITONIN,  WBC,  LACTICIDVEN Microbiology No results found for this or any previous visit (from the past 240 hour(s)).  Please note: You were cared for by a hospitalist during your hospital stay. Once you are discharged, your primary care physician will handle any further medical issues. Please note that NO REFILLS for any discharge medications will be authorized once you are discharged, as it is imperative that you return to your primary care physician (or establish a relationship with a primary care physician if you do not have one) for your post hospital discharge needs so that they can reassess your need for medications and monitor your lab values.    Time coordinating discharge: 40 minutes  SIGNED:   Shelly Coss, MD  [facility] 09/12/2018,  8:47 AM Pager [phone]  If 7PM-7AM, please contact night-coverage [email or web address] Password [email or web address]

## 2018-09-12 NOTE — Care Management Obs Status (Signed)
[place] NOTIFICATION   Patient Details  Name: [name] MRN: [id number] Date of Birth: 12-Jun-1946   Medicare Observation Status Notification Given:  Yes    Zenon Mayo, RN 09/12/2018, 9:19 AM

## 2018-09-12 NOTE — Care Management CC44 (Signed)
Condition Code 44 Documentation Completed  Patient Details  Name: [name] MRN: [id number] Date of Birth: 02-15-1946   Condition Code 44 given:  Yes Patient signature on Condition Code 44 notice:  Yes Documentation of 2 MD's agreement:  Yes Code 44 added to claim:  Yes    Zenon Mayo, RN 09/12/2018, 9:19 AM

## 2018-09-13 ENCOUNTER — Telehealth: Payer: Self-pay | Admitting: *Deleted

## 2018-09-13 NOTE — Telephone Encounter (Signed)
Pt was on TCM report admitted for observation 09/11/18 for shortness of breath and cough without sputum for the last 24 hours. Some intermittent episodes of chest pain, but only while coughing. Patient found to be hypoxic requiring 5 L nasal cannula. Chest x-ray noted edema although BNP only at 170. White blood cell count of 16,000 and she was started on IV Levaquin. Pt D/C 09/12/18, and was told to follow-up w/cardiology.Marland KitchenJohny Chess

## 2018-09-25 ENCOUNTER — Other Ambulatory Visit: Payer: Self-pay | Admitting: Internal Medicine

## 2018-11-23 ENCOUNTER — Other Ambulatory Visit: Payer: Self-pay | Admitting: Internal Medicine

## 2018-11-23 DIAGNOSIS — J301 Allergic rhinitis due to pollen: Secondary | ICD-10-CM

## 2018-11-30 ENCOUNTER — Telehealth: Payer: Self-pay | Admitting: Internal Medicine

## 2018-11-30 NOTE — Telephone Encounter (Signed)
Insurance has been submitted and verified for [name]. Patient is responsible for a $0 copay. Due anytime. Left message for patient to call back to schedule.  Okay to schedule... Visit Note: [name] ($0 copay - okay to give per [place]) Visit Type: Nurse Provider: Nurse 

## 2018-11-30 NOTE — Telephone Encounter (Signed)
Patient returned call and is aware. Will call back to schedule in June.

## 2018-12-18 ENCOUNTER — Encounter: Payer: Self-pay | Admitting: Internal Medicine

## 2018-12-18 ENCOUNTER — Other Ambulatory Visit: Payer: Self-pay | Admitting: Internal Medicine

## 2018-12-21 ENCOUNTER — Other Ambulatory Visit: Payer: Self-pay | Admitting: Internal Medicine

## 2018-12-26 ENCOUNTER — Encounter: Payer: Self-pay | Admitting: Internal Medicine

## 2018-12-28 ENCOUNTER — Telehealth: Payer: Self-pay | Admitting: Internal Medicine

## 2018-12-28 NOTE — Telephone Encounter (Signed)
Rec'd call from pt. On COVID 19 question line.  Stated she wants to change her appt. on 01/02/19, due to not wanting to come into the office at this time.  Stated in message her [name] shot is not due until June.  Requesting to have call back to change her appt. to later date.

## 2018-12-28 NOTE — Telephone Encounter (Signed)
LVM for patient to call back to resch appt, please just resch for a later date.

## 2019-01-02 ENCOUNTER — Ambulatory Visit: Payer: Medicare Other | Admitting: Internal Medicine

## 2019-01-02 ENCOUNTER — Telehealth: Payer: Self-pay

## 2019-01-02 NOTE — Telephone Encounter (Signed)
lmtcb

## 2019-01-09 ENCOUNTER — Ambulatory Visit: Payer: Medicare Other | Admitting: Internal Medicine

## 2019-01-13 ENCOUNTER — Other Ambulatory Visit: Payer: Self-pay | Admitting: Internal Medicine

## 2019-01-13 DIAGNOSIS — E785 Hyperlipidemia, unspecified: Secondary | ICD-10-CM

## 2019-01-13 DIAGNOSIS — F5105 Insomnia due to other mental disorder: Secondary | ICD-10-CM

## 2019-01-13 DIAGNOSIS — F409 Phobic anxiety disorder, unspecified: Secondary | ICD-10-CM

## 2019-01-14 ENCOUNTER — Other Ambulatory Visit: Payer: Self-pay | Admitting: Internal Medicine

## 2019-01-14 DIAGNOSIS — M19049 Primary osteoarthritis, unspecified hand: Secondary | ICD-10-CM

## 2019-01-15 ENCOUNTER — Other Ambulatory Visit: Payer: Self-pay | Admitting: Internal Medicine

## 2019-01-29 ENCOUNTER — Telehealth: Payer: Self-pay | Admitting: Internal Medicine

## 2019-01-29 NOTE — Telephone Encounter (Signed)
Copied from [place] [id number]. Topic: General - Other >> Jan 29, 2019  2:21 PM Oneta Rack wrote: Osvaldo Human name: [name] from [facility]  Call back number: [phone] fax # [phone]   Reason for call:  Checking on the status Medical Necessity Form, please note when received. Re faxing to [phone] (form faxed on 01/26/2019)

## 2019-01-29 NOTE — Telephone Encounter (Signed)
I have not seen fax yet.

## 2019-01-31 NOTE — Telephone Encounter (Signed)
[name] is re- faxing paperwork today.

## 2019-02-02 ENCOUNTER — Other Ambulatory Visit: Payer: Self-pay | Admitting: Internal Medicine

## 2019-02-02 NOTE — Telephone Encounter (Signed)
Caller Name: [name] w/[facility] Phone: [phone] Fax: [phone]  [name] is calling to follow up on fax again for CMN Pneumatic Compression Device. She is requesting call from [facility] to confirm it is received stating she has faxed it 3x already and has not received a response or call back. She has faxed to 2 different fax #s [phone] and [phone]. She is refaxing to [phone] today.

## 2019-02-05 NOTE — Telephone Encounter (Signed)
Patient called back and stated she did request this device.

## 2019-02-05 NOTE — Telephone Encounter (Signed)
I have received the fax.   I have left a message for pt to let me know if she did actually request this device. I am not sure that the request is patient driven.

## 2019-02-06 NOTE — Telephone Encounter (Signed)
Left detailed message for pt informing that fax has been sent per her request.

## 2019-02-06 NOTE — Telephone Encounter (Signed)
Zoe from [facility] is calling back on the status of the forms

## 2019-02-08 ENCOUNTER — Other Ambulatory Visit: Payer: Self-pay | Admitting: Internal Medicine

## 2019-03-04 ENCOUNTER — Other Ambulatory Visit: Payer: Self-pay | Admitting: Internal Medicine

## 2019-03-06 ENCOUNTER — Ambulatory Visit: Payer: Medicare Other | Admitting: Internal Medicine

## 2019-03-13 ENCOUNTER — Other Ambulatory Visit: Payer: Self-pay | Admitting: Internal Medicine

## 2019-03-17 ENCOUNTER — Other Ambulatory Visit: Payer: Self-pay | Admitting: Internal Medicine

## 2019-03-19 ENCOUNTER — Other Ambulatory Visit: Payer: Self-pay

## 2019-03-19 ENCOUNTER — Encounter: Payer: Self-pay | Admitting: Internal Medicine

## 2019-03-19 ENCOUNTER — Other Ambulatory Visit (INDEPENDENT_AMBULATORY_CARE_PROVIDER_SITE_OTHER): Payer: Medicare Other

## 2019-03-19 ENCOUNTER — Ambulatory Visit (INDEPENDENT_AMBULATORY_CARE_PROVIDER_SITE_OTHER)
Admission: RE | Admit: 2019-03-19 | Discharge: 2019-03-19 | Disposition: A | Discharge status: Home / Self Care | Payer: Medicare Other | Source: Ambulatory Visit | Attending: Internal Medicine | Admitting: Internal Medicine

## 2019-03-19 ENCOUNTER — Ambulatory Visit (INDEPENDENT_AMBULATORY_CARE_PROVIDER_SITE_OTHER): Payer: Medicare Other | Admitting: Internal Medicine

## 2019-03-19 VITALS — BP 130/70 | HR 90 | Temp 98.4°F | Resp 20 | Ht 62.0 in | Wt 122.5 lb

## 2019-03-19 DIAGNOSIS — I10 Essential (primary) hypertension: Secondary | ICD-10-CM | POA: Diagnosis not present

## 2019-03-19 DIAGNOSIS — M810 Age-related osteoporosis without current pathological fracture: Secondary | ICD-10-CM

## 2019-03-19 DIAGNOSIS — I2510 Atherosclerotic heart disease of native coronary artery without angina pectoris: Secondary | ICD-10-CM

## 2019-03-19 DIAGNOSIS — N183 Chronic kidney disease, stage 3 unspecified: Secondary | ICD-10-CM

## 2019-03-19 DIAGNOSIS — M79652 Pain in left thigh: Secondary | ICD-10-CM

## 2019-03-19 DIAGNOSIS — E782 Mixed hyperlipidemia: Secondary | ICD-10-CM

## 2019-03-19 DIAGNOSIS — F419 Anxiety disorder, unspecified: Secondary | ICD-10-CM

## 2019-03-19 DIAGNOSIS — Z1211 Encounter for screening for malignant neoplasm of colon: Secondary | ICD-10-CM

## 2019-03-19 LAB — CBC WITH DIFFERENTIAL/PLATELET
Basophils Absolute: 0.1 10*3/uL (ref 0.0–0.1)
Basophils Relative: 0.7 % (ref 0.0–3.0)
Eosinophils Absolute: 0.2 10*3/uL (ref 0.0–0.7)
Eosinophils Relative: 2 % (ref 0.0–5.0)
HCT: 39.1 % (ref 36.0–46.0)
Hemoglobin: 13.1 g/dL (ref 12.0–15.0)
Lymphocytes Relative: 17.5 % (ref 12.0–46.0)
Lymphs Abs: 1.4 10*3/uL (ref 0.7–4.0)
MCHC: 33.5 g/dL (ref 30.0–36.0)
MCV: 97.5 fl (ref 78.0–100.0)
Monocytes Absolute: 0.6 10*3/uL (ref 0.1–1.0)
Monocytes Relative: 7.7 % (ref 3.0–12.0)
Neutro Abs: 5.8 10*3/uL (ref 1.4–7.7)
Neutrophils Relative %: 72.1 % (ref 43.0–77.0)
Platelets: 233 10*3/uL (ref 150.0–400.0)
RBC: 4.01 Mil/uL (ref 3.87–5.11)
RDW: 13.7 % (ref 11.5–15.5)
WBC: 8.1 10*3/uL (ref 4.0–10.5)

## 2019-03-19 LAB — TSH: TSH: 3.18 u[IU]/mL (ref 0.35–4.50)

## 2019-03-19 MED ORDER — DENOSUMAB 60 MG/ML ~~LOC~~ SOSY: 60.0000 mg | PREFILLED_SYRINGE | Freq: Once | SUBCUTANEOUS | Status: DC

## 2019-03-19 MED ORDER — ALPRAZOLAM 1 MG PO TABS: 1.0000 mg | ORAL_TABLET | Freq: Three times a day (TID) | ORAL | 5 refills | Status: DC | PRN

## 2019-03-19 MED ORDER — DENOSUMAB 60 MG/ML ~~LOC~~ SOSY
60.0000 mg | PREFILLED_SYRINGE | Freq: Once | SUBCUTANEOUS | Status: AC
  Administered 2019-03-19: 60 mg via SUBCUTANEOUS

## 2019-03-19 NOTE — Progress Notes (Signed)
Subjective:  Patient ID: [name], female    DOB: 1946/08/22  Age: 73 y.o. MRN: [id number]  CC: Hyperlipidemia, COPD, and Coronary Artery Disease   HPI ESTREYA CLAY presents for f/up - She complains of chronic, intermittent left hip and thigh pain.  She thinks she saw someone else about this and they did an x-ray and told her it was okay.  The pain only occurs with activity.  She does not feel the need to take anything for the symptoms.  She complains of worsening anxiety and wants to increase her Xanax from twice a day to 3 times a day.  Outpatient Medications Prior to Visit  Medication Sig Dispense Refill  . Artificial Tear Ointment (DRY EYES OP) Apply 2 drops to eye daily as needed (dry eyes).     Marland Kitchen aspirin 325 MG EC tablet Take 1 tablet (325 mg total) by mouth every morning. (Patient taking differently: Take 325 mg by mouth daily. )    . butalbital-acetaminophen-caffeine (FIORICET, ESGIC) 50-325-40 MG tablet TAKE 1 TO 2 TABLETS BY MOUTH AS NEEDED FOR HEADACHE (Patient taking differently: Take 1 tablet by mouth every 4 (four) hours as needed for headache. ) 60 tablet 5  . Calcium Carbonate (CALCIUM 600 PO) Take 1 tablet by mouth daily.     . cholecalciferol (VITAMIN D) 1000 units tablet Take 1,000 Units by mouth daily.    . diclofenac sodium (VOLTAREN) 1 % GEL Apply 4 g topically as needed (for pain). 100 g 5  . ENULOSE 10 GM/15ML SOLN TAKE 45 MILLILITERS BY MOUTH 3 TIMES A DAY AS NEEDED FOR CONSTIPATION 1892 mL 1  . fluticasone (FLONASE) 50 MCG/ACT nasal spray USE 2 SPRAYS INTO EACH NOSTRIL ONCE DAILY**REPEAT FOR 5 DAYS THEN STOP (Patient taking differently: Place 2 sprays into both nostrils daily as needed for allergies. ) 16 g 11  . furosemide (LASIX) 40 MG tablet TAKE 1 TABLET BY MOUTH TWICE A DAY 180 tablet 0  . ipratropium (ATROVENT HFA) 17 MCG/ACT inhaler USE 2 PUFFS INTO THE LUNGS EVERY 4 HOURS AS NEEDED FOR SOB (Patient taking differently: Inhale 2 puffs into the lungs  every 4 (four) hours as needed for wheezing. ) 77.4 Inhaler 5  . ipratropium-albuterol (DUONEB) 0.5-2.5 (3) MG/3ML SOLN NEBULIZE 1 VIAL TWICE A DAY AS NEEDED (Patient taking differently: Inhale 3 mLs into the lungs 2 (two) times daily as needed (for SOB). ) 180 mL 3  . levocetirizine (XYZAL) 5 MG tablet TAKE 1 TABLET BY MOUTH EVERY EVENING (Patient taking differently: Take 5 mg by mouth every evening. ) 90 tablet 3  . montelukast (SINGULAIR) 10 MG tablet Take 1 tablet (10 mg total) by mouth at bedtime. 90 tablet 1  . nortriptyline (PAMELOR) 25 MG capsule Take 2 capsules (50 mg total) by mouth at bedtime. 180 capsule 1  . Omega 3 1200 MG CAPS Take 1,200 mg by mouth every morning.     . ondansetron (ZOFRAN) 8 MG tablet TAKE 1 TABLET BY MOUTH EVERY 8 HOURS AS NEEDED FOR NAUSEA 30 tablet 2  . OXYGEN-HELIUM IN Inhale 3 L into the lungs See admin instructions. Uses when needed during the day, uses continuous throughout the night    . pantoprazole (PROTONIX) 40 MG tablet Take 1 tablet (40 mg total) by mouth daily. Annual appt is due must see provider for future refills 30 tablet 0  . Pitavastatin Calcium (LIVALO) 2 MG TABS Take 1 tablet (2 mg total) by mouth daily. 90 tablet  1  . potassium chloride SA (KLOR-CON M20) 20 MEQ tablet Take 1 tablet (20 mEq total) by mouth daily. 90 tablet 3  . [name] 60 MG/ML SOSY injection INJECT 60MG  SUBCUTANEOUSLY EVERY 6 MONTHS IN THE UPPER ARM, THIGH OR ABDOMEN (Patient not taking: Reported on 09/11/2018) 1 each 0  . SYMBICORT 160-4.5 MCG/ACT inhaler INHALE 2 PUFFS INTO THE LUNGS TWICE A DAY (Patient taking differently: Inhale 2 puffs into the lungs 2 (two) times daily. ) 30.6 Inhaler 3  . VENTOLIN HFA 108 (90 Base) MCG/ACT inhaler TAKE 2 PUFFS BY MOUTH EVERY 4 HOURS AS NEEDED 36 Inhaler 5  . vitamin C (ASCORBIC ACID) 500 MG tablet Take 500 mg by mouth daily.    Marland Kitchen ALPRAZolam (XANAX) 1 MG tablet TAKE 1 TABLET BY MOUTH TWICE A DAY AS NEEDED 60 tablet 3  . denosumab ([name])  60 MG/ML SOLN injection Inject 60 mg into the skin every 6 (six) months. Administer in upper arm, thigh, or abdomen 1 each 0  . Multiple Vitamin (MULTIVITAMIN) tablet Take 1 tablet by mouth daily.     No facility-administered medications prior to visit.     ROS Review of Systems  Constitutional: Negative for chills, diaphoresis, fatigue, fever and unexpected weight change.  HENT: Negative.   Eyes: Negative for visual disturbance.  Respiratory: Positive for shortness of breath. Negative for cough, chest tightness and wheezing.   Cardiovascular: Negative for chest pain, palpitations and leg swelling.  Gastrointestinal: Negative for abdominal pain, constipation, diarrhea, nausea and vomiting.  Genitourinary: Negative.  Negative for difficulty urinating and dysuria.  Musculoskeletal: Positive for arthralgias. Negative for back pain, myalgias and neck pain.  Skin: Negative.  Negative for color change, pallor and rash.  Neurological: Negative.  Negative for dizziness, weakness, light-headedness and headaches.  Hematological: Negative for adenopathy. Does not bruise/bleed easily.  Psychiatric/Behavioral: Negative.     Objective:  BP 130/70 (BP Location: Left Arm, Patient Position: Sitting, Cuff Size: Normal)   Pulse 90   Temp 98.4 F (36.9 C) (Oral)   Resp 20   Ht 5\' 2"  (1.575 m)   Wt 122 lb 8 oz (55.6 kg)   SpO2 93%   BMI 22.41 kg/m   BP Readings from Last 3 Encounters:  03/19/19 130/70  09/12/18 (!) 104/49  07/03/18 (!) 96/58    Wt Readings from Last 3 Encounters:  03/19/19 122 lb 8 oz (55.6 kg)  09/12/18 117 lb 1 oz (53.1 kg)  07/03/18 122 lb 12.8 oz (55.7 kg)    Physical Exam Vitals signs reviewed.  Constitutional:      General: She is not in acute distress.    Appearance: She is ill-appearing. She is not toxic-appearing or diaphoretic.  HENT:     Nose: Nose normal.     Mouth/Throat:     Mouth: Mucous membranes are moist.     Pharynx: No oropharyngeal exudate or  posterior oropharyngeal erythema.  Eyes:     General: No scleral icterus.    Conjunctiva/sclera: Conjunctivae normal.  Neck:     Musculoskeletal: Normal range of motion. No neck rigidity.  Cardiovascular:     Rate and Rhythm: Normal rate and regular rhythm.     Heart sounds: No murmur. No gallop.   Pulmonary:     Effort: Accessory muscle usage and prolonged expiration present.     Breath sounds: Examination of the right-upper field reveals decreased breath sounds. Examination of the left-upper field reveals decreased breath sounds. Examination of the right-middle field reveals decreased breath  sounds and rales. Examination of the left-middle field reveals decreased breath sounds and rales. Examination of the right-lower field reveals decreased breath sounds and rales. Examination of the left-lower field reveals decreased breath sounds and rales. Decreased breath sounds and rales present. No wheezing or rhonchi.  Abdominal:     General: Abdomen is scaphoid. Bowel sounds are normal.     Palpations: Abdomen is soft. There is no hepatomegaly or splenomegaly.     Tenderness: There is no abdominal tenderness.  Musculoskeletal: Normal range of motion.     Left upper leg: Normal. She exhibits no tenderness, no bony tenderness, no swelling, no edema, no deformity and no laceration.     Right lower leg: No edema.     Left lower leg: No edema.  Lymphadenopathy:     Cervical: No cervical adenopathy.  Skin:    General: Skin is warm and dry.  Neurological:     General: No focal deficit present.     Mental Status: She is oriented to person, place, and time. Mental status is at baseline.  Psychiatric:        Mood and Affect: Mood normal.        Behavior: Behavior normal.     Lab Results  Component Value Date   WBC 8.1 03/19/2019   HGB 13.1 03/19/2019   HCT 39.1 03/19/2019   PLT 233.0 03/19/2019   GLUCOSE 68 (L) 03/19/2019   CHOL 176 03/19/2019   TRIG 92.0 03/19/2019   HDL 76.50 03/19/2019    LDLCALC 81 03/19/2019   ALT 18 03/19/2019   AST 28 03/19/2019   NA 140 03/19/2019   K 3.7 03/19/2019   CL 96 03/19/2019   CREATININE 1.12 03/19/2019   BUN 13 03/19/2019   CO2 32 03/19/2019   TSH 3.18 03/19/2019   INR 0.87 09/05/2013   HGBA1C 5.3 10/16/2015    Dg Chest 2 View  Result Date: 09/11/2018 CLINICAL DATA:  Cough and shortness of breath tonight. EXAM: CHEST - 2 VIEW COMPARISON:  AP radiograph 08/26/2017, most recent chest CT 01/01/2015 FINDINGS: Chronic cardiomegaly and hilar prominence. Unchanged mediastinal contours with aortic atherosclerosis. Progressive reticular and interstitial thickening from prior exam. Additional ill-defined opacities in the right hemithorax are likely related scarring. Right apical pleuroparenchymal scarring. No confluent airspace disease, pleural effusion, or pneumothorax. Skin fold projects over the left lateral chest. Surgical clips in the right axilla. IMPRESSION: Increased reticular opacities and interstitial thickening compared to prior exam 1 year ago. Differential considerations include pulmonary edema, interstitial lung disease, or combination thereof. Mild cardiomegaly is unchanged. Electronically Signed   By: Keith Rake M.D.   On: 09/11/2018 02:23   Dg Femur Min 2 Views Left  Result Date: 03/20/2019 CLINICAL DATA:  Pain for 10 years. Worsening for last 2-3 days. History of breast cancer. No injury. EXAM: LEFT FEMUR 2 VIEWS COMPARISON:  No recent. FINDINGS: No acute bony or joint abnormality identified. No evidence of fracture or focal lesion. Degenerative changes left hip. Peripheral vascular calcification. IMPRESSION: 1. No acute or focal bony abnormality. Degenerative change left hip. 2.  Peripheral vascular disease. Electronically Signed   By: Marcello Moores  Register   On: 03/20/2019 06:48    Assessment & Plan:   Celena was seen today for hyperlipidemia, copd and coronary artery disease.  Diagnoses and all orders for this visit:   Coronary artery disease involving native coronary artery of native heart without angina pectoris- She has not recently had any episodes of angina.  Will continue to  work on risk factor modifications. -     Lipid panel; Future -     TSH; Future  Essential hypertension- Her blood pressure is adequately well controlled. -     Comprehensive metabolic panel; Future -     CBC with Differential/Platelet; Future -     TSH; Future  Mixed hyperlipidemia -     Lipid panel; Future -     Comprehensive metabolic panel; Future  Osteoporosis, unspecified osteoporosis type, unspecified pathological fracture presence -     denosumab ([name]) injection 60 mg -     denosumab ([name]) injection 60 mg  Pain of left thigh- The exam is unremarkable.  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  I have asked her let me know if she wants to see an orthopedist about this. -     DG FEMUR MIN 2 VIEWS LEFT; Future  Anxiety -     ALPRAZolam (XANAX) 1 MG tablet; Take 1 tablet (1 mg total) by mouth 3 (three) times daily as needed.  Colon cancer screening -     Cologuard   I have discontinued [name] "Clotiel Bossman"'s multivitamin. I have also changed her ALPRAZolam. Additionally, I am having her maintain her OXYGEN-HELIUM IN, Omega 3, aspirin, cholecalciferol, Artificial Tear Ointment (DRY EYES OP), vitamin C, fluticasone, Calcium Carbonate (CALCIUM 600 PO), ipratropium-albuterol, levocetirizine, potassium chloride SA, ipratropium, Symbicort, butalbital-acetaminophen-caffeine, [name], montelukast, ondansetron, Pitavastatin Calcium, nortriptyline, diclofenac sodium, Ventolin HFA, pantoprazole, furosemide, and Enulose. We administered denosumab. We will continue to administer denosumab.  Meds ordered this encounter  Medications  . denosumab ([name]) injection 60 mg  . ALPRAZolam (XANAX) 1 MG tablet    Sig: Take 1 tablet (1 mg total) by mouth 3 (three) times daily as needed.    Dispense:  90 tablet     Refill:  5    Not to exceed 2 additional fills before 02/23/2019  . denosumab ([name]) injection 60 mg     Follow-up: Return in about 6 months (around 09/18/2019).  Scarlette Calico, MD

## 2019-03-19 NOTE — Patient Instructions (Signed)
Coronary Artery Disease, Female  Coronary artery disease (CAD) is a condition in which the arteries that lead to the heart (coronary arteries) become narrow or blocked. The narrowing or blockage can lead to decreased blood flow to the heart. Prolonged reduced blood flow can cause a heart attack (myocardial infarction or MI). This condition may also be called coronary heart disease. Because CAD is the leading cause of death in women, it is important to understand what causes this condition and how it is treated. What are the causes? CAD is most often caused by atherosclerosis. This is the buildup of fat and cholesterol (plaque) on the inside of the arteries. Over time, the plaque may narrow or block the artery, reducing blood flow to the heart. Plaque can also become weak and break off within a coronary artery and cause a sudden blockage. Other less common causes of CAD include:  An embolism or blood clot in a coronary artery.  A tearing of the artery (spontaneous coronary artery dissection).  An aneurysm.  Inflammation (vasculitis) in the artery wall. What increases the risk? The following factors may make you more likely to develop this condition:  Age. Women over age 24 are at a greater risk of CAD.  Family history of CAD.  High blood pressure (hypertension).  Diabetes.  High cholesterol levels.  Tobacco use.  Lack of exercise.  Menopause. ? All postmenopausal women are at greater risk of CAD. ? Women who have experienced menopause between the ages of 62-45 (early menopause) are at a higher risk of CAD. ? Women who have experienced menopause before age 23 (premature menopause) are at a very high risk of CAD.  Excessive alcohol use  A diet high in saturated and trans fats, such as fried food and processed meat. Other possible risk factors include:  High stress levels.  Depression  Obesity.  Sleep apnea. What are the signs or symptoms? Many people do not have any  symptoms during the early stages of CAD. As the condition progresses, symptoms may include:  Chest pain (angina). The pain can: ? Feel like crushing or squeezing, or a tightness, pressure, fullness, or heaviness in the chest. ? Last more than a few minutes or can stop and recur. The pain tends to get worse with exercise or stress and to fade with rest.  Pain in the arms, neck, jaw, or back.  Unexplained heartburn or indigestion.  Shortness of breath.  Nausea.  Sudden cold sweats.  Sudden light-headedness.  Fluttering or fast heartbeat (palpitations). Many women have chest discomfort and the other symptoms. However, women often have unusual (atypical) symptoms, such as:  Fatigue.  Vomiting.  Unexplained feelings of nervousness or anxiety.  Unexplained weakness.  Dizziness or fainting. How is this diagnosed? This condition is diagnosed based on:  Your family and medical history.  A physical exam.  Tests, including: ? A test to check the electrical signals in your heart (electrocardiogram). ? Exercise stress test. This looks for signs of blockage when the heart is stressed with exercise, such as running on a treadmill. ? Pharmacologic stress test. This test looks for signs of blockage when the heart is being stressed with a medicine. ? Blood tests. ? Coronary angiogram. This is a procedure to look at the coronary arteries to see if there is any blockage. During this test, a dye is injected into your arteries so they appear on an X-ray. ? A test that uses sound waves to take a picture of your heart (echocardiogram). ?  Chest X-ray. How is this treated? This condition may be treated by:  Healthy lifestyle changes to reduce risk factors.  Medicines such as: ? Antiplatelet medicines and blood-thinning medicines, such as aspirin. These help prevent blood clots. ? Nitroglycerin. ? Blood pressure medicines. ? Cholesterol-lowering medicine.  Coronary angioplasty and  stenting. During this procedure, a thin, flexible tube is inserted through a blood vessel and into a blocked artery. A balloon or similar device on the end of the tube is inflated to open up the artery. In some cases, a small, mesh tube (stent) is inserted into the artery to keep it open.  Coronary artery bypass surgery. During this surgery, veins or arteries from other parts of the body are used to create a bypass around the blockage and allow blood to reach your heart. Follow these instructions at home: Medicines  Take over-the-counter and prescription medicines only as told by your health care provider.  Do not take the following medicines unless your health care provider approves: ? NSAIDs, such as ibuprofen, naproxen, or celecoxib. ? Vitamin supplements that contain vitamin A, vitamin E, or both. ? Hormone replacement therapy that contains estrogen with or without progestin. Lifestyle  Follow an exercise program approved by your health care provider. Aim for 150 minutes of moderate exercise or 75 minutes of vigorous exercise each week.  Maintain a healthy weight or lose weight as approved by your health care provider.  Rest when you are tired.  Learn to manage stress or try to limit your stress. Ask your health care provider for suggestions if you need help.  Get screened for depression and seek treatment, if needed.  Do not use any products that contain nicotine or tobacco, such as cigarettes and e-cigarettes. If you need help quitting, ask your health care provider.  Do not use illegal drugs. Eating and drinking  Follow a heart-healthy diet. A dietitian can help educate you about healthy food options and changes. In general, eat plenty of fruits and vegetables, lean meats, and whole grains.  Avoid foods high in: ? Sugar. ? Salt (sodium). ? Saturated fats, such as processed or fatty meat. ? Trans fats, such as fried food.  Use healthy cooking methods such as roasting,  grilling, broiling, baking, poaching, steaming, or stir-frying.  If you drink alcohol, and your health care provider approves, limit your alcohol intake to no more than 1 drink per day. One drink equals 12 ounces of beer, 5 ounces of wine, or 1 ounces of hard liquor. General instructions  Manage any other health conditions, such as hypertension and diabetes. These conditions affect your heart.  Your health care provider may ask you to monitor your blood pressure. Ideally, your blood pressure should be below 130/80.  Keep all follow-up visits as told by your health care provider. This is important. Get help right away if:  You have pain in your chest, neck, arm, jaw, stomach, or back that: ? Lasts more than a few minutes. ? Is recurring. ? Is not relieved by taking medicine under your tongue (sublingualnitroglycerin).  You have profuse sweating without cause.  You have unexplained: ? Heartburn or indigestion. ? Shortness of breath or difficulty breathing. ? Fluttering or fast heartbeat (palpitations). ? Nausea or vomiting. ? Fatigue. ? Feelings of nervousness or anxiety. ? Weakness. ? Diarrhea.  You have sudden light-headedness or dizziness.  You faint.  You feel like hurting yourself or think about taking your own life. These symptoms may represent a serious problem that is an  emergency. Do not wait to see if the symptoms will go away. Get medical help right away. Call your local emergency services (911 in the [place] Do not drive yourself to the hospital. Summary  Coronary artery disease (CAD) is a process in which the arteries that lead to the heart (coronary arteries) become narrow or blocked. The narrowing or blockage can lead to a heart attack.  Many women have chest discomfort and other common symptoms of CAD. However, women often have different (atypical) symptoms, such as fatigue, vomiting, and dizziness or weakness.  CAD can be treated with lifestyle changes,  medicines, surgery, or a combination of these treatments. This information is not intended to replace advice given to you by your health care provider. Make sure you discuss any questions you have with your health care provider. Document Released: 12/06/2011 Document Revised: 09/03/2016 Document Reviewed: 09/03/2016 Elsevier Interactive Patient Education  2019 [employer].

## 2019-03-20 ENCOUNTER — Encounter: Payer: Self-pay | Admitting: Internal Medicine

## 2019-03-20 ENCOUNTER — Other Ambulatory Visit: Payer: Self-pay | Admitting: Internal Medicine

## 2019-03-20 DIAGNOSIS — N183 Chronic kidney disease, stage 3 unspecified: Secondary | ICD-10-CM | POA: Insufficient documentation

## 2019-03-20 LAB — COMPREHENSIVE METABOLIC PANEL
ALT: 18 U/L (ref 0–35)
AST: 28 U/L (ref 0–37)
Albumin: 4.1 g/dL (ref 3.5–5.2)
Alkaline Phosphatase: 54 U/L (ref 39–117)
BUN: 13 mg/dL (ref 6–23)
CO2: 32 mEq/L (ref 19–32)
Calcium: 10.3 mg/dL (ref 8.4–10.5)
Chloride: 96 mEq/L (ref 96–112)
Creatinine, Ser: 1.12 mg/dL (ref 0.40–1.20)
GFR: 47.66 mL/min — ABNORMAL LOW (ref >60.00)
Glucose, Bld: 68 mg/dL — ABNORMAL LOW (ref 70–99)
Potassium: 3.7 mEq/L (ref 3.5–5.1)
Sodium: 140 mEq/L (ref 135–145)
Total Bilirubin: 0.4 mg/dL (ref 0.2–1.2)
Total Protein: 7.5 g/dL (ref 6.0–8.3)

## 2019-03-20 LAB — LIPID PANEL
Cholesterol: 176 mg/dL (ref 0–200)
HDL: 76.5 mg/dL (ref >39.00)
LDL Cholesterol: 81 mg/dL (ref 0–99)
NonHDL: 99.39
Total CHOL/HDL Ratio: 2
Triglycerides: 92 mg/dL (ref 0.0–149.0)
VLDL: 18.4 mg/dL (ref 0.0–40.0)

## 2019-03-20 NOTE — Assessment & Plan Note (Signed)
She will avoid nephrotoxic agents.

## 2019-04-03 LAB — COLOGUARD: Cologuard: POSITIVE — AB

## 2019-04-04 ENCOUNTER — Telehealth: Payer: Self-pay

## 2019-04-04 ENCOUNTER — Encounter: Payer: Self-pay | Admitting: Internal Medicine

## 2019-04-04 DIAGNOSIS — R195 Other fecal abnormalities: Secondary | ICD-10-CM

## 2019-04-04 NOTE — Telephone Encounter (Signed)
LVM for pt to call back. Please send her to me when she calls back.   Pt cologuard result came in. The result is positive. Result has been abstracted. GI referral entered. PCP made aware of same.

## 2019-04-05 NOTE — Telephone Encounter (Signed)
Pt informed cologuard results were positive and that we did a referral to GI

## 2019-04-05 NOTE — Telephone Encounter (Signed)
Left another message for patient to call back asap.

## 2019-04-05 NOTE — Telephone Encounter (Signed)
Patient is returning Stefannie's call Please advise thank you Cb- [phone]

## 2019-04-09 ENCOUNTER — Other Ambulatory Visit: Payer: Self-pay | Admitting: Internal Medicine

## 2019-04-10 ENCOUNTER — Encounter: Payer: Self-pay | Admitting: Internal Medicine

## 2019-04-14 ENCOUNTER — Other Ambulatory Visit: Payer: Self-pay | Admitting: Internal Medicine

## 2019-04-27 ENCOUNTER — Other Ambulatory Visit: Payer: Self-pay | Admitting: Internal Medicine

## 2019-05-01 ENCOUNTER — Other Ambulatory Visit: Payer: Self-pay | Admitting: Internal Medicine

## 2019-05-01 DIAGNOSIS — J438 Other emphysema: Secondary | ICD-10-CM

## 2019-05-01 DIAGNOSIS — J301 Allergic rhinitis due to pollen: Secondary | ICD-10-CM

## 2019-05-01 DIAGNOSIS — I5032 Chronic diastolic (congestive) heart failure: Secondary | ICD-10-CM

## 2019-05-01 DIAGNOSIS — K7290 Hepatic failure, unspecified without coma: Secondary | ICD-10-CM

## 2019-05-01 DIAGNOSIS — K7682 Hepatic encephalopathy: Secondary | ICD-10-CM

## 2019-05-01 MED ORDER — FUROSEMIDE 40 MG PO TABS: 40.0000 mg | ORAL_TABLET | Freq: Two times a day (BID) | ORAL | 1 refills | Status: DC

## 2019-05-01 MED ORDER — LACTULOSE ENCEPHALOPATHY 10 GM/15ML PO SOLN: ORAL | 1 refills | Status: DC

## 2019-05-01 MED ORDER — IPRATROPIUM-ALBUTEROL 0.5-2.5 (3) MG/3ML IN SOLN: 3.0000 mL | Freq: Two times a day (BID) | RESPIRATORY_TRACT | 1 refills | Status: DC | PRN

## 2019-05-01 MED ORDER — MONTELUKAST SODIUM 10 MG PO TABS: 10.0000 mg | ORAL_TABLET | Freq: Every day | ORAL | 1 refills | Status: DC

## 2019-05-05 ENCOUNTER — Other Ambulatory Visit: Payer: Self-pay | Admitting: Internal Medicine

## 2019-05-05 DIAGNOSIS — J301 Allergic rhinitis due to pollen: Secondary | ICD-10-CM

## 2019-05-08 ENCOUNTER — Other Ambulatory Visit: Payer: Self-pay

## 2019-05-08 ENCOUNTER — Encounter: Payer: Self-pay | Admitting: Physician Assistant

## 2019-05-08 ENCOUNTER — Ambulatory Visit (INDEPENDENT_AMBULATORY_CARE_PROVIDER_SITE_OTHER): Payer: Medicare Other | Admitting: Physician Assistant

## 2019-05-08 VITALS — BP 108/73 | HR 90 | Ht 62.0 in | Wt 120.0 lb

## 2019-05-08 DIAGNOSIS — I2510 Atherosclerotic heart disease of native coronary artery without angina pectoris: Secondary | ICD-10-CM

## 2019-05-08 DIAGNOSIS — E785 Hyperlipidemia, unspecified: Secondary | ICD-10-CM

## 2019-05-08 DIAGNOSIS — I350 Nonrheumatic aortic (valve) stenosis: Secondary | ICD-10-CM

## 2019-05-08 DIAGNOSIS — J449 Chronic obstructive pulmonary disease, unspecified: Secondary | ICD-10-CM | POA: Diagnosis not present

## 2019-05-08 DIAGNOSIS — I10 Essential (primary) hypertension: Secondary | ICD-10-CM

## 2019-05-08 NOTE — Patient Instructions (Signed)
Medication Instructions:  The current medical regimen is effective;  continue present plan and medications.  If you need a refill on your cardiac medications before your next appointment, please call your pharmacy.   Follow-Up: At [facility], you and your health needs are our priority.  As part of our continuing mission to provide you with exceptional heart care, we have created designated Provider Care Teams.  These Care Teams include your primary Cardiologist (physician) and Advanced Practice Providers (APPs -  Physician Assistants and Nurse Practitioners) who all work together to provide you with the care you need, when you need it. You will need a follow up appointment in 6 months.  Please call our office 2 months in advance to schedule this appointment.  You may see Pixie Casino, MD or one of the following Advanced Practice Providers on your designated Care Team: [place], [place] . Fabian Sharp, PA-C

## 2019-05-08 NOTE — Progress Notes (Signed)
Cardiology Clinic Note   Patient Name: [name] Date of Encounter: 05/08/2019  Primary Care Provider:  [name], MD Primary Cardiologist:  Pixie Casino, MD  Patient Profile    [name] 73 year old female presents today for follow-up of her aortic stenosis, COPD and coronary artery disease.  Past Medical History    Past Medical History:  Diagnosis Date  . Abnormal LFTs 08/02/2011  . Acute liver failure 06/19/2013  . Acute renal failure ([place]) 06/19/2013  . Acute respiratory failure ([place]) 06/19/2013  . Altered mental status 08/01/2011  . Angina   . Anxiety   . Anxiety state, unspecified 12/03/2013  . Aortic stenosis    mild  . Arthritis    "hands" (02/03/2017)  . Breast cancer, right breast ([place]) dx'd 2008  . CAD (coronary artery disease) of artery bypass graft 11/06/2013  . CAD (coronary artery disease), MI R/O 08/01/2011   PCI in 2006 (bare metal stent, unknown artery) - [place]   . CAP (community acquired pneumonia) 09/11/2018  . CHF,  acute diastolic, BNP 4k on admissio 08/01/2011  . Chronic bronchitis ([place])   . Chronic respiratory failure ([place]) 02/02/2012  . Compression fracture of L1 lumbar vertebra ([place]) 02/02/2012  . COPD (chronic obstructive pulmonary disease) (HCC)    oxygen-dependent 4LPM [place]  . Coronary artery disease 2006   2 stents w/previous MI  . Depressed   . Diastolic CHF ([place])   . Dyslipidemia   . Family history of adverse reaction to anesthesia    "daughter had c-section; missed twice w/epidural"  . GERD (gastroesophageal reflux disease)   . Heart murmur   . History of blood transfusion    "w/my colon OR"  . 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 History of nuclear stress test 08/03/2011   attenuation at apex - no perfusion defects   . HTN (hypertension) 11/06/2013  . Hyperkalemia, on ACE prior to admission 11/11/2011  . Hypertension   . Hyponatremia 01/31/2012  . Migraine    "qod to q couple months since I was 21" (02/03/2017)  . Mild  aortic stenosis 08/01/2011   AVA 1.69 cm2 (06/02/2011)   . Moderate to severe pulmonary hypertension ([place])   . NSTEMI (non-ST elevated myocardial infarction) ([place]) 2006  . NSVT (nonsustained ventricular tachycardia) (HCC)    h/o  . On home oxygen therapy    "3L just at night; have it available prn duringtheday" (09/11/2018)  . Pneumonia    "alot of times" (02/03/2017)   Past Surgical History:  Procedure Laterality Date  . BREAST BIOPSY Right 2008  . BREAST LUMPECTOMY Right 2008   malignant  . CATARACT EXTRACTION W/ INTRAOCULAR LENS  IMPLANT, BILATERAL Bilateral ~ 2013  . [place]; 1971; 1978  . CHOLECYSTECTOMY OPEN  2011  . COLON SURGERY    . COLOSTOMY  11/2007  . COLOSTOMY CLOSURE  07/2008  . CORONARY ANGIOPLASTY WITH STENT PLACEMENT  2006   "2 stents"  . ERCP N/A 09/05/2015   Procedure: ENDOSCOPIC RETROGRADE CHOLANGIOPANCREATOGRAPHY (ERCP);  Surgeon: Ladene Artist, MD;  Location: [facility];  Service: Endoscopy;  Laterality: N/A;  . PARTIAL COLECTOMY  2009   for obstruction: temporary ostomy, later reversed.   Marland Kitchen RIGHT HEART CATHETERIZATION N/A 09/05/2013   Procedure: RIGHT HEART CATH;  Surgeon: Jolaine Artist, MD;  Location: [facility] CATH LAB;  Service: Cardiovascular;  Laterality: N/A;  . TRANSTHORACIC ECHOCARDIOGRAM  11/12/2011   EF 37-16%, normal systolic function, grade 1 diastolic dysfunction;  ventricular septal flattening (D-sign); mild AS; trace-mild MR; LA mildly dilated; RV mod dilated; RA mod dilated; severe pulm HTN; elevated CVP    Allergies  Allergies  Allergen Reactions  . Ceftriaxone Anaphylaxis and Other (See Comments)    *ROCEPHIN*  "Blow up like a balloon"  . Hydroxyzine Shortness Of Breath and Other (See Comments)    Pt states med make her light headed, get sob sxs  . Doxycycline Nausea And Vomiting  . Lexapro [Escitalopram] Other (See Comments)    Pt states med make her dizzy    History of Present Illness    [name] was last seen  on 07/03/2018 Dr. Debara Pickett.  During that time she was doing well from a cardiac standpoint and her aortic stenosis was stable.  Echocardiogram (09/11/2018) LVEF 55 to 16%, grade 1 diastolic dysfunction, aortic valve mean gradient stable since 2018 with 14 mmHg mild stenosis.  She also had not had any recent admissions for pulmonary  issues.  She received an oxygen concentrator in the spring 2019 which is helped with her shortness of breath and sees pulmonary for her COPD.  She was placed on Livalo in 2018.  She has a remote history of coronary artery disease with PCI, and BMS x2 placement in [place] in 2006.   Her PMH also includes chronic diastolic CHF, coronary artery disease (2006), hypertension, emphysema, COPD, osteoporosis, CKD 3, breast cancer, mixed hyperlipidemia (LDL goal less than 100), oxygen dependent, insomnia, and anxiety.   She presents to the clinic today and states that she feels well.  She has been doing nebulizer treatments in the morning in the evening has no complaints about her breathing and states that her oxygen saturation routinely is in the 99% range at home.  She carries her oxygen concentration machine with her today and has not changed from 3 L.  She is fairly sedentary at home but states she likes to help with her grandchildren.  She denies chest pain, increased shortness of breath, lower extremity edema, fatigue, palpitations, melena, hematuria, hemoptysis, diaphoresis, weakness, presyncope, syncope, orthopnea, and PND.  Home Medications    Prior to Admission medications   Medication Sig Start Date End Date Taking? Authorizing Provider  ALPRAZolam Duanne Moron) 1 MG tablet Take 1 tablet (1 mg total) by mouth 3 (three) times daily as needed. 03/19/19   [name], MD  Artificial Tear Ointment (DRY EYES OP) Apply 2 drops to eye daily as needed (dry eyes).     [provider]  aspirin 325 MG EC tablet Take 1 tablet (325 mg total) by mouth every morning. Patient taking  differently: Take 325 mg by mouth daily.  09/21/15   Velvet Bathe, MD  butalbital-acetaminophen-caffeine (FIORICET, ESGIC) 50-325-40 MG tablet TAKE 1 TO 2 TABLETS BY MOUTH AS NEEDED FOR HEADACHE Patient taking differently: Take 1 tablet by mouth every 4 (four) hours as needed for headache.  08/22/18   [name], MD  Calcium Carbonate (CALCIUM 600 PO) Take 1 tablet by mouth daily.     [provider]  cholecalciferol (VITAMIN D) 1000 units tablet Take 1,000 Units by mouth daily.    [provider]  diclofenac sodium (VOLTAREN) 1 % GEL Apply 4 g topically as needed (for pain). 01/14/19   [name], MD  fluticasone (FLONASE) 50 MCG/ACT nasal spray USE 2 SPRAYS INTO EACH NOSTRIL ONCE DAILY**REPEAT FOR 5 DAYS THEN STOP Patient taking differently: Place 2 sprays into both nostrils daily as needed for allergies.  04/18/16   [name], MD  furosemide (LASIX) 40 MG tablet Take 1 tablet (40 mg total) by mouth 2 (two) times daily. 05/01/19   [name], MD  ipratropium (ATROVENT HFA) 17 MCG/ACT inhaler USE 2 PUFFS INTO THE LUNGS EVERY 4 HOURS AS NEEDED FOR SOB Patient taking differently: Inhale 2 puffs into the lungs every 4 (four) hours as needed for wheezing.  08/09/18   [name], MD  ipratropium-albuterol (DUONEB) 0.5-2.5 (3) MG/3ML SOLN Inhale 3 mLs into the lungs 2 (two) times daily as needed (for SOB). 05/01/19   [name], MD  lactulose, encephalopathy, (ENULOSE) 10 GM/15ML SOLN TAKE 45 MILLILITERS BY MOUTH 3 TIMES A DAY AS NEEDED FOR CONSTIPATION 05/01/19   [name], MD  levocetirizine (XYZAL) 5 MG tablet Take 1 tablet (5 mg total) by mouth every evening. 05/05/19   [name], MD  montelukast (SINGULAIR) 10 MG tablet Take 1 tablet (10 mg total) by mouth at bedtime. 05/01/19   [name], MD  nortriptyline (PAMELOR) 25 MG capsule Take 2 capsules (50 mg total) by mouth at bedtime. 01/14/19   [name], MD  Omega 3 1200 MG CAPS Take 1,200 mg  by mouth every morning.     [provider]  ondansetron (ZOFRAN) 8 MG tablet TAKE 1 TABLET BY MOUTH EVERY 8 HOURS AS NEEDED FOR NAUSEA 11/24/18   [name], MD  OXYGEN-HELIUM IN Inhale 3 L into the lungs See admin instructions. Uses when needed during the day, uses continuous throughout the night    [provider]  pantoprazole (PROTONIX) 40 MG tablet Take 1 tablet (40 mg total) by mouth daily. 03/21/19   [name], MD  Pitavastatin Calcium (LIVALO) 2 MG TABS Take 1 tablet (2 mg total) by mouth daily. 01/14/19   [name], MD  potassium chloride SA (KLOR-CON M20) 20 MEQ tablet Take 1 tablet (20 mEq total) by mouth daily. 06/30/18   [name], MD  [name] 60 MG/ML SOSY injection INJECT 60MG  SUBCUTANEOUSLY EVERY 6 MONTHS IN THE UPPER ARM, THIGH OR ABDOMEN Patient not taking: Reported on 09/11/2018 08/29/18   [name], MD  SYMBICORT 160-4.5 MCG/ACT inhaler INHALE 2 PUFFS INTO THE LUNGS TWICE A DAY Patient taking differently: Inhale 2 puffs into the lungs 2 (two) times daily.  08/09/18   [name], MD  VENTOLIN HFA 108 (90 Base) MCG/ACT inhaler TAKE 2 PUFFS BY MOUTH EVERY 4 HOURS AS NEEDED 01/15/19   [name], MD  vitamin C (ASCORBIC ACID) 500 MG tablet Take 500 mg by mouth daily.    [provider]    Family History    Family History  Problem Relation Age of Onset  . Schizophrenia Sister   . Alzheimer's disease Mother   . Hyperlipidemia Brother   . Heart disease Sister   . Diabetes Sister   . Cancer Neg Hx   . Stroke Neg Hx   . COPD Neg Hx   . Depression Neg Hx   . Drug abuse Neg Hx   . Early death Neg Hx   . Hypertension Neg Hx   . Kidney disease Neg Hx    She indicated that her mother is deceased. She indicated that her father is deceased. She indicated that one of her three sisters is deceased. She indicated that the status of her brother is unknown. She indicated that her daughter is alive. She indicated that the  status of her neg hx is unknown.  Social History    Social History   Socioeconomic History  . Marital status: Widowed    Spouse name: Not on file  . Number of children: 3  . Years of education: 5  . Highest education level: Not on file  Occupational History  . Not on file  Social Needs  . Financial resource strain: Not very hard  . Food insecurity    Worry: Never true    Inability: Never true  . Transportation needs    Medical: No    Non-medical: No  Tobacco Use  . Smoking status: Current Some Day Smoker    Packs/day: 0.50    Years: 53.00    Pack years: 26.50    Types: Cigarettes  . Smokeless tobacco: Never Used  Substance and Sexual Activity  . Alcohol use: Yes    Alcohol/week: 1.0 standard drinks    Types: 1 Glasses of wine per week  . Drug use: No  . Sexual activity: Not Currently  Lifestyle  . Physical activity    Days per week: 0 days    Minutes per session: 0 min  . Stress: Not at all  Relationships  . Social connections    Talks on phone: More than three times a week    Gets together: More than three times a week    Attends religious service: 1 to 4 times per year    Active member of club or organization: Yes    Attends meetings of clubs or organizations: 1 to 4 times per year    Relationship status: Widowed  . Intimate partner violence    Fear of current or ex partner: Not on file    Emotionally abused: Not on file    Physically abused: Not on file    Forced sexual activity: Not on file  Other Topics Concern  . Not on file  Social History Narrative  . Not on file     Review of Systems    General:  No chills, fever, night sweats or weight changes.  Cardiovascular:  No chest pain, dyspnea on exertion, edema, orthopnea, palpitations, paroxysmal nocturnal dyspnea. Dermatological: No rash, lesions/masses Respiratory: No cough, dyspnea Urologic: No hematuria, dysuria Abdominal:   No nausea, vomiting, diarrhea, bright red blood per rectum, melena,  or hematemesis Neurologic:  No visual changes, wkns, changes in mental status. All other systems reviewed and are otherwise negative except as noted above.  Physical Exam    VS:  BP 108/73   Pulse 90   Ht 5\' 2"  (1.575 m)   Wt 120 lb (54.4 kg)   SpO2 95%   BMI 21.95 kg/m  , BMI Body mass index is 21.95 kg/m. GEN: Well nourished, well developed, in no acute distress. HEENT: normal. Neck: Supple, no JVD, carotid bruits, or masses. Cardiac: RRR, no murmurs, rubs, or gallops. No clubbing, cyanosis, edema.  Radials/DP/PT 2+ and equal bilaterally.  Respiratory:  Respirations regular and unlabored, inspiratory wheeze to auscultation bilaterally. GI: Soft, nontender, nondistended, BS + x 4. MS: no deformity or atrophy. Skin: warm and dry, no rash. Neuro:  Strength and sensation are intact. Psych: Normal affect.  Accessory Clinical Findings    ECG personally reviewed by me today-normal sinus rhythm 85 bpm- No acute changes  EKG 09/13/2018: Sinus tachycardia 106 bpm  EKG 07/03/2018: Sinus rhythm with frequent premature ventricular complexes 73 bpm  EKG 12/09/2017 Normal sinus rhythm 76 bpm  Echocardiogram 09/11/2018 Study Conclusions  - Left  ventricle: The cavity size was normal. Wall thickness was   normal. Systolic function was normal. The estimated ejection   fraction was in the range of 55% to 60%. Doppler parameters are   consistent with abnormal left ventricular relaxation (grade 1   diastolic dysfunction). - Aortic valve: Mean gradient stable since 2018 when it was 14   mmHg. There was mild stenosis. Valve area (VTI): 1.35 cm^2. Valve   area (Vmax): 1.24 cm^2. Valve area (Vmean): 1.38 cm^2. - Mitral valve: Valve area by continuity equation (using LVOT   flow): 2.7 cm^2. - Atrial septum: A patent foramen ovale cannot be excluded. Assessment & Plan   1.  Mild aortic stenosis-echocardiogram 09/11/2018 showed mean gradient stable since 2018 at 14 mmHg, mild stenosis valve  area 1.35 cm.  No chest pain, increased dyspnea, presyncope or syncopal symptoms. Recommend follow-up echocardiogram every 2 to 3 years  2.  Severe COPD- oxygen saturation 95% and no increased dyspnea noted. Followed by pulmonary-Dr. [place] Bells  3.  Essential hypertension-well controlled 108/73 today Continue low-sodium heart healthy diet  4.  History of coronary artery disease- PCI and BMS timesx 2 2006.  No chest pain today, no chest pain with exertion.  5.  Dyslipidemia-03/19/2019: Cholesterol 176; HDL 76.50; LDL Cholesterol 81; Triglycerides 92.0; VLDL 18.4( LDL Goal <100) Continue Livalo 2 mg tablet daily Continue omega-3 fatty acids 12 mg tablet every morning  Disposition: Follow-up with Dr. Debara Pickett in 6 months   Deberah Pelton, NP-C 05/08/2019, 4:45 PM

## 2019-05-09 ENCOUNTER — Encounter: Payer: Self-pay | Admitting: Internal Medicine

## 2019-05-09 ENCOUNTER — Telehealth: Payer: Self-pay | Admitting: Internal Medicine

## 2019-05-09 ENCOUNTER — Other Ambulatory Visit: Payer: Self-pay | Admitting: Internal Medicine

## 2019-05-09 NOTE — Telephone Encounter (Signed)
Medication Refill - Medication: [place]-acetamin-caff 50-325-40 (Patient wanted to inform provider that she last had medication in April 2020)   Has the patient contacted their pharmacy? Yes (Agent: If no, request that the patient contact the pharmacy for the refill.) (Agent: If yes, when and what did the pharmacy advise?)Contact PCP  Preferred Pharmacy (with phone number or street name): [id number] - [place], [place] - [address] [phone] (Phone) [phone] (Fax)     Agent: Please be advised that RX refills may take up to 3 business days. We ask that you follow-up with your pharmacy.

## 2019-05-09 NOTE — Telephone Encounter (Signed)
Patient called in stating that she is needing a refill on this medication due to running out and her migraines are getting worse. Advised her an appointment may be necessary. Pt stated she just wants a refill and if necessary to have a CMA call. Please advise.

## 2019-05-10 ENCOUNTER — Other Ambulatory Visit: Payer: Self-pay | Admitting: Internal Medicine

## 2019-05-10 DIAGNOSIS — G43009 Migraine without aura, not intractable, without status migrainosus: Secondary | ICD-10-CM

## 2019-05-10 MED ORDER — BUTALBITAL-APAP-CAFFEINE 50-325-40 MG PO TABS: 1.0000 | ORAL_TABLET | ORAL | 2 refills | Status: DC | PRN

## 2019-05-14 ENCOUNTER — Ambulatory Visit: Payer: Medicare Other | Admitting: Internal Medicine

## 2019-05-23 ENCOUNTER — Encounter: Payer: Self-pay | Admitting: Internal Medicine

## 2019-06-05 ENCOUNTER — Encounter: Payer: Self-pay | Admitting: Internal Medicine

## 2019-06-13 ENCOUNTER — Other Ambulatory Visit: Payer: Self-pay | Admitting: Internal Medicine

## 2019-06-13 DIAGNOSIS — Z1231 Encounter for screening mammogram for malignant neoplasm of breast: Secondary | ICD-10-CM

## 2019-06-24 ENCOUNTER — Other Ambulatory Visit: Payer: Self-pay | Admitting: Internal Medicine

## 2019-06-24 DIAGNOSIS — F409 Phobic anxiety disorder, unspecified: Secondary | ICD-10-CM

## 2019-06-24 DIAGNOSIS — E785 Hyperlipidemia, unspecified: Secondary | ICD-10-CM

## 2019-06-24 DIAGNOSIS — F5105 Insomnia due to other mental disorder: Secondary | ICD-10-CM

## 2019-07-07 ENCOUNTER — Other Ambulatory Visit: Payer: Self-pay | Admitting: Internal Medicine

## 2019-07-07 DIAGNOSIS — J42 Unspecified chronic bronchitis: Secondary | ICD-10-CM

## 2019-07-07 DIAGNOSIS — J438 Other emphysema: Secondary | ICD-10-CM

## 2019-07-30 ENCOUNTER — Ambulatory Visit
Admission: RE | Admit: 2019-07-30 | Discharge: 2019-07-30 | Disposition: A | Discharge status: Home / Self Care | Payer: Medicare Other | Source: Ambulatory Visit | Attending: Internal Medicine | Admitting: Internal Medicine

## 2019-07-30 ENCOUNTER — Encounter: Payer: Self-pay | Admitting: Internal Medicine

## 2019-07-30 ENCOUNTER — Other Ambulatory Visit: Payer: Self-pay

## 2019-07-30 DIAGNOSIS — Z1231 Encounter for screening mammogram for malignant neoplasm of breast: Secondary | ICD-10-CM

## 2019-07-31 LAB — HM MAMMOGRAPHY

## 2019-08-08 ENCOUNTER — Other Ambulatory Visit: Payer: Self-pay | Admitting: Internal Medicine

## 2019-08-08 DIAGNOSIS — G43009 Migraine without aura, not intractable, without status migrainosus: Secondary | ICD-10-CM

## 2019-08-13 ENCOUNTER — Telehealth: Payer: Self-pay | Admitting: Internal Medicine

## 2019-08-13 DIAGNOSIS — J449 Chronic obstructive pulmonary disease, unspecified: Secondary | ICD-10-CM

## 2019-08-13 NOTE — Telephone Encounter (Signed)
Copied from [place] [id number]. Topic: General - Other >> Aug 13, 2019 10:21 AM Keene Breath wrote: Reason for CRM: Called to get clarification on prescription for VENTOLIN HFA 108 (90 Base) MCG/ACT inhaler.  Scripts says for 1, but patient said she should be getting 2.  Please advise and call to discuss at [phone]

## 2019-08-14 MED ORDER — ALBUTEROL SULFATE HFA 108 (90 BASE) MCG/ACT IN AERS: 2.0000 | INHALATION_SPRAY | RESPIRATORY_TRACT | 5 refills | Status: DC | PRN

## 2019-08-14 NOTE — Telephone Encounter (Signed)
Pharmacy contacted. They stated rx should be written for 36g instead of 18g. Erx resent as requested.

## 2019-08-29 ENCOUNTER — Encounter: Payer: Self-pay | Admitting: Internal Medicine

## 2019-08-29 ENCOUNTER — Other Ambulatory Visit: Payer: Self-pay | Admitting: Internal Medicine

## 2019-09-07 ENCOUNTER — Encounter: Payer: Self-pay | Admitting: Internal Medicine

## 2019-09-07 ENCOUNTER — Ambulatory Visit: Payer: Medicare Other

## 2019-09-07 NOTE — Telephone Encounter (Signed)
LVM for pt informing that she can not get the [name] until 09/19/2019.

## 2019-09-10 ENCOUNTER — Other Ambulatory Visit: Payer: Self-pay | Admitting: Internal Medicine

## 2019-09-11 ENCOUNTER — Encounter: Payer: Self-pay | Admitting: Internal Medicine

## 2019-09-16 ENCOUNTER — Other Ambulatory Visit: Payer: Self-pay | Admitting: Internal Medicine

## 2019-09-16 DIAGNOSIS — F419 Anxiety disorder, unspecified: Secondary | ICD-10-CM

## 2019-09-19 ENCOUNTER — Encounter: Payer: Self-pay | Admitting: Internal Medicine

## 2019-09-19 ENCOUNTER — Ambulatory Visit (INDEPENDENT_AMBULATORY_CARE_PROVIDER_SITE_OTHER): Payer: Medicare Other

## 2019-09-19 ENCOUNTER — Telehealth: Payer: Self-pay | Admitting: Internal Medicine

## 2019-09-19 DIAGNOSIS — M810 Age-related osteoporosis without current pathological fracture: Secondary | ICD-10-CM

## 2019-09-19 MED ORDER — DENOSUMAB 60 MG/ML ~~LOC~~ SOSY
60.0000 mg | PREFILLED_SYRINGE | Freq: Once | SUBCUTANEOUS | Status: AC
  Administered 2019-09-19: 15:00:00 60 mg via SUBCUTANEOUS

## 2019-09-19 NOTE — Telephone Encounter (Signed)
Patient had prolia arrive today.  I have placed in nurse refrigerator and have placed [facility] forms in Tamara's box.

## 2019-09-20 NOTE — Telephone Encounter (Signed)
Injection arrived in office 12/23 and patient received injection

## 2019-09-20 NOTE — Progress Notes (Signed)
I have reviewed and agree.

## 2019-10-02 ENCOUNTER — Other Ambulatory Visit: Payer: Self-pay | Admitting: Internal Medicine

## 2019-10-09 NOTE — Telephone Encounter (Signed)
Yes! All should be fine! I will run benefits prior to her appointment to confirm.

## 2019-10-11 ENCOUNTER — Other Ambulatory Visit: Payer: Self-pay | Admitting: Internal Medicine

## 2019-10-11 DIAGNOSIS — J301 Allergic rhinitis due to pollen: Secondary | ICD-10-CM

## 2019-10-11 DIAGNOSIS — I5032 Chronic diastolic (congestive) heart failure: Secondary | ICD-10-CM

## 2019-10-17 ENCOUNTER — Other Ambulatory Visit: Payer: Self-pay | Admitting: Internal Medicine

## 2019-10-17 ENCOUNTER — Encounter: Payer: Self-pay | Admitting: Internal Medicine

## 2019-10-17 DIAGNOSIS — F419 Anxiety disorder, unspecified: Secondary | ICD-10-CM

## 2019-10-20 ENCOUNTER — Ambulatory Visit: Payer: Medicare Other | Attending: Internal Medicine

## 2019-10-20 DIAGNOSIS — Z23 Encounter for immunization: Secondary | ICD-10-CM | POA: Insufficient documentation

## 2019-10-27 ENCOUNTER — Encounter: Payer: Self-pay | Admitting: Internal Medicine

## 2019-11-02 ENCOUNTER — Ambulatory Visit: Payer: Medicare Other | Admitting: Internal Medicine

## 2019-11-10 ENCOUNTER — Ambulatory Visit: Payer: Medicare Other

## 2019-11-10 ENCOUNTER — Ambulatory Visit: Payer: Medicare Other | Attending: Internal Medicine

## 2019-11-10 DIAGNOSIS — Z23 Encounter for immunization: Secondary | ICD-10-CM | POA: Insufficient documentation

## 2019-11-10 NOTE — Progress Notes (Signed)
   Covid-19 Vaccination Clinic  Name:  [name]    MRN: [id number] DOB: Apr 03, 1946  11/10/2019  [name] was observed post Covid-19 immunization for 15 minutes without incidence. She was provided with Vaccine Information Sheet and instruction to access the V-Safe system.   [name] was instructed to call 911 with any severe reactions post vaccine: Marland Kitchen Difficulty breathing  . Swelling of your face and throat  . A fast heartbeat  . A bad rash all over your body  . Dizziness and weakness    Immunizations Administered    Name Date Dose VIS Date Route   Pfizer COVID-19 Vaccine 11/10/2019  1:43 PM 0.3 mL 09/07/2019 Intramuscular   Manufacturer: [place]   Lot: [id number]   [place]: [id number]

## 2019-11-16 ENCOUNTER — Other Ambulatory Visit: Payer: Self-pay | Admitting: Internal Medicine

## 2019-11-16 DIAGNOSIS — E785 Hyperlipidemia, unspecified: Secondary | ICD-10-CM

## 2019-11-16 DIAGNOSIS — F409 Phobic anxiety disorder, unspecified: Secondary | ICD-10-CM

## 2019-11-16 DIAGNOSIS — F5105 Insomnia due to other mental disorder: Secondary | ICD-10-CM

## 2019-12-14 ENCOUNTER — Other Ambulatory Visit: Payer: Self-pay

## 2019-12-14 ENCOUNTER — Ambulatory Visit (INDEPENDENT_AMBULATORY_CARE_PROVIDER_SITE_OTHER): Payer: Medicare Other | Admitting: Internal Medicine

## 2019-12-14 ENCOUNTER — Encounter: Payer: Self-pay | Admitting: Internal Medicine

## 2019-12-14 VITALS — BP 128/78 | HR 71 | Temp 97.3°F | Ht 62.0 in | Wt 119.8 lb

## 2019-12-14 DIAGNOSIS — E785 Hyperlipidemia, unspecified: Secondary | ICD-10-CM | POA: Diagnosis not present

## 2019-12-14 DIAGNOSIS — I2510 Atherosclerotic heart disease of native coronary artery without angina pectoris: Secondary | ICD-10-CM | POA: Diagnosis not present

## 2019-12-14 DIAGNOSIS — I350 Nonrheumatic aortic (valve) stenosis: Secondary | ICD-10-CM

## 2019-12-14 NOTE — Patient Instructions (Addendum)
Medication Instructions:  Your physician recommends that you continue on your current medications as directed. Please refer to the Current Medication list given to you today.  *If you need a refill on your cardiac medications before your next appointment, please call your pharmacy*  Lab Work: NONE   Testing/Procedures: Your physician has requested that you have an echocardiogram. Echocardiography is a painless test that uses sound waves to create images of your heart. It provides your doctor with information about the size and shape of your heart and how well your heart's chambers and valves are working. This procedure takes approximately one hour. There are no restrictions for this procedure. [address]  Follow-Up: At [facility], you and your health needs are our priority.  As part of our continuing mission to provide you with exceptional heart care, we have created designated Provider Care Teams.  These Care Teams include your primary Cardiologist (physician) and Advanced Practice Providers (APPs -  Physician Assistants and Nurse Practitioners) who all work together to provide you with the care you need, when you need it.  We recommend signing up for the patient portal called "MyChart".  Sign up information is provided on this After Visit Summary.  MyChart is used to connect with patients for Virtual Visits (Telemedicine).  Patients are able to view lab/test results, encounter notes, upcoming appointments, etc.  Non-urgent messages can be sent to your provider as well.   To learn more about what you can do with MyChart, go to [email or web address].    Your next appointment:   6 month(s) You will receive a reminder letter in the mail two months in advance. If you don't receive a letter, please call our office to schedule the follow-up appointment.  The format for your next appointment:   In Person  Provider:   You may see Pixie Casino, MD or one of  the following Advanced Practice Providers on your designated Care Team:    Almyra Deforest, PA-C  Fabian Sharp, PA-C or   Roby Lofts, [place]

## 2019-12-14 NOTE — Progress Notes (Signed)
OFFICE NOTE  Chief Complaint:  No complaints  Primary Care Physician: [name], MD  HPI:  [name] a pleasant 74 year old female with a history of COPD and severe pulmonary hypertension. Recently she was put on home oxygen and has been on an oxygen concentrator, which has made a marked improvement in her ability to get around. She has previously seen Dr. Lamonte Sakai with pulmonology; however, that did not go too well and she has basically given up on any further treatments for COPD other than her oxygen. She still uses nebulizers as needed when she is tight, and I recommended Claritin for seasonal allergies today. As you know, she also has mild aortic stenosis and we are continuing to follow that. She is a low-risk stress test in November 2012. Unfortunately she was recently admitted for altered mental status, possible Tylenol overdose, respiratory failure and liver failure. All of which seems to have resolved. She seems to be doing pre-well after discharge.  She returns today for followup. She reports doing fairly well. She has seen Dr. Lake Bells in the interim and he is recommended continue current therapies. She is now seeing a different internist, Dr. Ronnald Ramp. He was recently admitted the hospital for pneumonia and treated accordingly. She had troponins which were negative suggesting they're likely is no significant underlying obstructive coronary disease. She reports she's recovered and is back to her base. She is overdue for repeat lipid profile.  I saw [name] back in the office today. Overall she reports she is doing fairly well. She's not been admitted to the hospital recently. She says she has stable shortness of breath. She denies any chest pain. It should be noted that she does have mild aortic stenosis which will last assessed in 2014.  [name] returns today for follow-up. She reports that she is doing fairly well. Fortune she's not been hospitalized. She has had an  episode of COPD exacerbation which was supported with early antibiotics. She denies any worsening chest pain and has pretty stable shortness of breath on oxygen. She is able to ambulate and do most of her activities without any restrictions.  03/31/2016  [name] was seen back today in the office for follow-up. She seems to be doing fairly well although does use her oxygen almost continuously. Saturation was low today although she had heavy fingernail polish on and therefore the reading may not be accurate as it read 88%. She was on oxygen via a concentrator. Blood pressure was stable at 108/74. Weight is been stable. She recently had a repeat echocardiogram which shows stable mild aortic stenosis and normal LV function with some mild improvement in pulmonary pressures in May. She denies any chest pain. Fortune that she's not been admitted to the hospital recently for any pneumonia but did have problems with recent gallbladder disease and had cholecystectomy.  12/17/2016  [name] returns today for follow-up. She's done well over the last year has not been hospitalized. She reports stable shortness of breath. She had mild aortic stenosis which is stable by echo last year and will need to be repeated in May of this year. Her last lipid profile showed total cholesterol 197, HDL C 78, triglycerides 100, an LDL-C 99. We'll need to repeat her lipid profile as well.  06/20/2017  I saw [name] back today for follow-up. She is doing well. We repeated on echo in 01/2017, this demonstrated normal LVEF 60-65%, mild stable aortic stenosis. She denies any worsening shortness of breath or chest  pain. Cholesterol remains elevated. Will plan to recheck that today. She could conceivably increase her Livalo. She reports having had her flu vaccine this year.  08/16/2017  [name] returns today for follow-up.  She seen specifically for oxygen testing.  We tested her room air saturation off of oxygen and it was noted to be 88% at rest.   We did not do ambulatory testing as she met criteria for oxygen therapy.  She is currently on an oxygen concentrator which we wish to renew.  She needs the oxygen concentrator as she is active and is mobile but requires continuous oxygen therapy -she is on 3 L continuous by nasal cannula.  12/09/2017  [name] returns today for follow-up.  She reports doing fairly well.  She is pleased that she finally got her oxygen concentrator.  Blood pressure is well-controlled today 122/72.  She denies chest pain or worsening shortness of breath.  Should her weight is been stable.  She had a recent lipid profile in December which showed LDL cholesterol of 81 and non-HDL cholesterol of 98.  She does have mild aortic valve stenosis which was assessed by echo last year.  07/03/2018   [name] returns today for follow-up.  Overall she feels like her breathing is done well.  She has not been hospitalized for pulmonary issues.  She denies chest pain or significant shortness of breath.  She continues to use oxygen.  She reports some improvement in her headaches.  Blood pressure remains soft but does not seem to be bothersome for her.  Her last lipid profile shows good control of her cholesterol.  She remains on Livalo.  Echo in 2018 showed mild aortic stenosis.  12/14/2019  [name] is seen today in follow-up.  She continues to be fairly stable from a cardiac standpoint.  She denies any chest pain or shortness of breath.  Her COPD seems to be stable on oxygen.  Blood pressure today is well controlled at 128/78.  Is now been a couple years since her last echo which did show some mild aortic stenosis.  She also has moderate coronary artery disease but does not seem to have had any issues with it.  EKG today shows sinus rhythm at 71. Her most recent lipids were TC 176, TG 92, HDL 76 and LDL 81.  PMHx:  Past Medical History:  Diagnosis Date  . Abnormal LFTs 08/02/2011  . Acute liver failure 06/19/2013  . Acute renal failure ([place])  06/19/2013  . Acute respiratory failure ([place]) 06/19/2013  . Altered mental status 08/01/2011  . Angina   . Anxiety   . Anxiety state, unspecified 12/03/2013  . Aortic stenosis    mild  . Arthritis    "hands" (02/03/2017)  . Breast cancer, right breast ([place]) dx'd 2008  . CAD (coronary artery disease) of artery bypass graft 11/06/2013  . CAD (coronary artery disease), MI R/O 08/01/2011   PCI in 2006 (bare metal stent, unknown artery) - [place]   . CAP (community acquired pneumonia) 09/11/2018  . CHF,  acute diastolic, BNP 4k on admissio 08/01/2011  . Chronic bronchitis ([place])   . Chronic respiratory failure ([place]) 02/02/2012  . Compression fracture of L1 lumbar vertebra ([place]) 02/02/2012  . COPD (chronic obstructive pulmonary disease) ([facility])    oxygen-dependent 4LPM [place]  . Coronary artery disease 2006   2 stents w/previous MI  . Depressed   . Diastolic CHF ([place])   . Dyslipidemia   . Family history of adverse reaction to anesthesia    "  daughter had c-section; missed twice w/epidural"  . GERD (gastroesophageal reflux disease)   . Heart murmur   . History of blood transfusion    "w/my colon OR"  . 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 History of nuclear stress test 08/03/2011   attenuation at apex - no perfusion defects   . HTN (hypertension) 11/06/2013  . Hyperkalemia, on ACE prior to admission 11/11/2011  . Hypertension   . Hyponatremia 01/31/2012  . Migraine    "qod to q couple months since I was 21" (02/03/2017)  . Mild aortic stenosis 08/01/2011   AVA 1.69 cm2 (06/02/2011)   . Moderate to severe pulmonary hypertension ([place])   . NSTEMI (non-ST elevated myocardial infarction) ([place]) 2006  . NSVT (nonsustained ventricular tachycardia) ([facility])    h/o  . On home oxygen therapy    "3L just at night; have it available prn duringtheday" (09/11/2018)  . Pneumonia    "alot of times" (02/03/2017)    Past Surgical History:  Procedure Laterality Date  . BREAST BIOPSY Right 2008  . BREAST LUMPECTOMY Right  2008   malignant  . CATARACT EXTRACTION W/ INTRAOCULAR LENS  IMPLANT, BILATERAL Bilateral ~ 2013  . [place]; 1971; 1978  . CHOLECYSTECTOMY OPEN  2011  . COLON SURGERY    . COLOSTOMY  11/2007  . COLOSTOMY CLOSURE  07/2008  . CORONARY ANGIOPLASTY WITH STENT PLACEMENT  2006   "2 stents"  . ERCP N/A 09/05/2015   Procedure: ENDOSCOPIC RETROGRADE CHOLANGIOPANCREATOGRAPHY (ERCP);  Surgeon: Ladene Artist, MD;  Location: [facility];  Service: Endoscopy;  Laterality: N/A;  . PARTIAL COLECTOMY  2009   for obstruction: temporary ostomy, later reversed.   Marland Kitchen RIGHT HEART CATHETERIZATION N/A 09/05/2013   Procedure: RIGHT HEART CATH;  Surgeon: Jolaine Artist, MD;  Location: [facility] C[facility] LAB;  Service: Cardiovascular;  Laterality: N/A;  . TRANSTHORACIC ECHOCARDIOGRAM  11/12/2011   EF 70-26%, normal systolic function, grade 1 diastolic dysfunction; ventricular septal flattening (D-sign); mild AS; trace-mild MR; LA mildly dilated; RV mod dilated; RA mod dilated; severe pulm HTN; elevated CVP    FAMHx:  Family History  Problem Relation Age of Onset  . Schizophrenia Sister   . Alzheimer's disease Mother   . Hyperlipidemia Brother   . Heart disease Sister   . Diabetes Sister   . Cancer Neg Hx   . Stroke Neg Hx   . COPD Neg Hx   . Depression Neg Hx   . Drug abuse Neg Hx   . Early death Neg Hx   . Hypertension Neg Hx   . Kidney disease Neg Hx     SOCHx:   reports that she has been smoking cigarettes. She has a 26.50 pack-year smoking history. She has never used smokeless tobacco. She reports current alcohol use of about 1.0 standard drinks of alcohol per week. She reports that she does not use drugs.  ALLERGIES:  Allergies  Allergen Reactions  . Ceftriaxone Anaphylaxis and Other (See Comments)    *ROCEPHIN*  "Blow up like a balloon"  . Hydroxyzine Shortness Of Breath and Other (See Comments)    Pt states med make her light headed, get sob sxs  . Doxycycline Nausea And Vomiting   . Lexapro [Escitalopram] Other (See Comments)    Pt states med make her dizzy    ROS: Pertinent items noted in HPI and remainder of comprehensive ROS otherwise negative.  HOME MEDS: Current Outpatient Medications  Medication Sig Dispense Refill  . albuterol (VENTOLIN  HFA) 108 (90 Base) MCG/ACT inhaler Inhale 2 puffs into the lungs every 4 (four) hours as needed. 36 g 5  . ALPRAZolam (XANAX) 1 MG tablet TAKE 1 TABLET BY MOUTH 3 TIMES A DAY AS NEEDED 90 tablet 3  . Artificial Tear Ointment (DRY EYES OP) Apply 2 drops to eye daily as needed (dry eyes).     Marland Kitchen aspirin 325 MG EC tablet Take 1 tablet (325 mg total) by mouth every morning. (Patient taking differently: Take 325 mg by mouth daily. )    . butalbital-acetaminophen-caffeine (FIORICET) 50-325-40 MG tablet TAKE 1 TABLET BY MOUTH EVERY 4 (FOUR) HOURS AS NEEDED FOR HEADACHE. 65 tablet 2  . Calcium Carbonate (CALCIUM 600 PO) Take 1 tablet by mouth daily.     . cholecalciferol (VITAMIN D) 1000 units tablet Take 1,000 Units by mouth daily.    . diclofenac sodium (VOLTAREN) 1 % GEL Apply 4 g topically as needed (for pain). 100 g 5  . fluticasone (FLONASE) 50 MCG/ACT nasal spray USE 2 SPRAYS INTO EACH NOSTRIL ONCE DAILY**REPEAT FOR 5 DAYS THEN STOP (Patient taking differently: Place 2 sprays into both nostrils daily as needed for allergies. ) 16 g 11  . furosemide (LASIX) 40 MG tablet TAKE 1 TABLET BY MOUTH TWICE A DAY 180 tablet 1  . ipratropium (ATROVENT HFA) 17 MCG/ACT inhaler Inhale 2 puffs into the lungs every 4 (four) hours as needed for wheezing. 77.4 g 1  . ipratropium-albuterol (DUONEB) 0.5-2.5 (3) MG/3ML SOLN Inhale 3 mLs into the lungs 2 (two) times daily as needed (for SOB). 270 mL 1  . KLOR-CON M20 20 MEQ tablet TAKE 1 TABLET BY MOUTH EVERY DAY 90 tablet 1  . lactulose, encephalopathy, (ENULOSE) 10 GM/15ML SOLN TAKE 45 MILLILITERS BY MOUTH 3 TIMES A DAY AS NEEDED FOR CONSTIPATION 1892 mL 1  . levocetirizine (XYZAL) 5 MG tablet Take  1 tablet (5 mg total) by mouth every evening. 90 tablet 1  . LIVALO 2 MG TABS TAKE 1 TABLET BY MOUTH EVERY DAY 90 tablet 1  . montelukast (SINGULAIR) 10 MG tablet TAKE 1 TABLET BY MOUTH EVERYDAY AT BEDTIME 90 tablet 1  . nortriptyline (PAMELOR) 25 MG capsule TAKE 2 CAPSULES BY MOUTH EVERY DAY AT BEDTIME 180 capsule 1  . Omega 3 1200 MG CAPS Take 1,200 mg by mouth every morning.     . ondansetron (ZOFRAN) 8 MG tablet TAKE 1 TABLET BY MOUTH EVERY 8 HOURS AS NEEDED FOR NAUSEA 30 tablet 2  . OXYGEN-HELIUM IN Inhale 3 L into the lungs See admin instructions. Uses when needed during the day, uses continuous throughout the night    . pantoprazole (PROTONIX) 40 MG tablet TAKE 1 TABLET BY MOUTH EVERY DAY 90 tablet 1  . [name] 60 MG/ML SOSY injection TO BE ADMINISTERED IN PHYSICIAN'S OFFICE. INJECT ONE SYRINGE SUBCOUSLY ONCE EVERY 6 MONTHS. REFRIGERATE. USE WITHIN 14 DAYS ONCE AT ROOM TEMPERATURE. 1 mL 1  . SYMBICORT 160-4.5 MCG/ACT inhaler Inhale 2 puffs into the lungs 2 (two) times daily. 30.6 g 2  . vitamin C (ASCORBIC ACID) 500 MG tablet Take 500 mg by mouth daily.     No current facility-administered medications for this visit.    LABS/IMAGING: No results found for this or any previous visit (from the past 48 hour(s)). No results found.  VITALS: BP 128/78   Pulse 71   Temp (!) 97.3 F (36.3 C)   Ht '5\' 2"'$  (1.575 m)   Wt 119 lb 12.8 oz (54.3  kg)   SpO2 96%   BMI 21.91 kg/m   EXAM: General appearance: alert, no distress and on oxygen Neck: no carotid bruit, no JVD and thyroid not enlarged, symmetric, no tenderness/mass/nodules Lungs: diminished breath sounds bilaterally Heart: regular rate and rhythm, S1, S2 normal and systolic murmur: early systolic 3/6, crescendo at 2nd right intercostal space Abdomen: soft, non-tender; bowel sounds normal; no masses,  no organomegaly Extremities: extremities normal, atraumatic, no cyanosis or edema Pulses: 2+ and symmetric Skin: Skin color, texture,  turgor normal. No rashes or lesions Neurologic: Grossly normal Psych: Pleasant  EKG: Normal sinus rhythm at 71 - personally reviewed  ASSESSMENT: 1. Severe COPD with moderate pulmonary hypertension, on chronic oxygen (disabled d/t this) 2. Mild aortic stenosis 3. Low risk nuclear stress testing in 2012 4. Dyslipidemia - on livalo   5. History of CAD with prior remote PCI  PLAN: 1.   [name] seems stable from a cardiac standpoint.  She does have an aortic murmur which sounds mild to moderate.  This was last assessed by echo in December 2019.  We will plan a repeat echo now.  Lipids are at goal.  Blood pressures well controlled.  She seems to be stable with regards to COPD.  Follow-up with me in 6 months or sooner as necessary.  Pixie Casino, MD, [facility], [place] Director of the [facility]  [facility] Diplomate of the American Board of Clinical Lipidology  Attending Cardiologist  Direct Dial: [phone]  Fax: [phone]  Website:  [email or web address] 12/14/2019, 3:53 PM

## 2019-12-17 ENCOUNTER — Encounter: Payer: Self-pay | Admitting: Internal Medicine

## 2019-12-25 ENCOUNTER — Encounter: Payer: Self-pay | Admitting: Internal Medicine

## 2019-12-27 ENCOUNTER — Other Ambulatory Visit: Payer: Self-pay | Admitting: Internal Medicine

## 2019-12-27 DIAGNOSIS — K7290 Hepatic failure, unspecified without coma: Secondary | ICD-10-CM

## 2019-12-27 DIAGNOSIS — K7682 Hepatic encephalopathy: Secondary | ICD-10-CM

## 2020-01-09 ENCOUNTER — Ambulatory Visit (HOSPITAL_COMMUNITY): Payer: Medicare Other | Attending: Cardiovascular Disease

## 2020-01-09 ENCOUNTER — Other Ambulatory Visit: Payer: Self-pay

## 2020-01-09 DIAGNOSIS — I350 Nonrheumatic aortic (valve) stenosis: Secondary | ICD-10-CM | POA: Diagnosis present

## 2020-02-03 ENCOUNTER — Other Ambulatory Visit: Payer: Self-pay | Admitting: Internal Medicine

## 2020-02-03 DIAGNOSIS — J449 Chronic obstructive pulmonary disease, unspecified: Secondary | ICD-10-CM

## 2020-02-13 ENCOUNTER — Other Ambulatory Visit: Payer: Self-pay | Admitting: Internal Medicine

## 2020-02-13 DIAGNOSIS — F419 Anxiety disorder, unspecified: Secondary | ICD-10-CM

## 2020-02-27 ENCOUNTER — Other Ambulatory Visit: Payer: Self-pay | Admitting: Family

## 2020-02-27 DIAGNOSIS — J449 Chronic obstructive pulmonary disease, unspecified: Secondary | ICD-10-CM

## 2020-03-06 ENCOUNTER — Other Ambulatory Visit: Payer: Self-pay | Admitting: Internal Medicine

## 2020-03-11 ENCOUNTER — Ambulatory Visit: Payer: Medicare Other

## 2020-03-16 ENCOUNTER — Other Ambulatory Visit: Payer: Self-pay | Admitting: Internal Medicine

## 2020-03-16 DIAGNOSIS — K7682 Hepatic encephalopathy: Secondary | ICD-10-CM

## 2020-03-16 DIAGNOSIS — J42 Unspecified chronic bronchitis: Secondary | ICD-10-CM

## 2020-03-16 DIAGNOSIS — K7290 Hepatic failure, unspecified without coma: Secondary | ICD-10-CM

## 2020-03-16 DIAGNOSIS — J438 Other emphysema: Secondary | ICD-10-CM

## 2020-03-19 ENCOUNTER — Other Ambulatory Visit: Payer: Self-pay | Admitting: Internal Medicine

## 2020-03-19 DIAGNOSIS — J42 Unspecified chronic bronchitis: Secondary | ICD-10-CM

## 2020-03-19 DIAGNOSIS — J438 Other emphysema: Secondary | ICD-10-CM

## 2020-04-08 ENCOUNTER — Encounter: Payer: Self-pay | Admitting: Internal Medicine

## 2020-04-08 ENCOUNTER — Other Ambulatory Visit: Payer: Self-pay | Admitting: Internal Medicine

## 2020-04-08 DIAGNOSIS — J301 Allergic rhinitis due to pollen: Secondary | ICD-10-CM

## 2020-04-08 DIAGNOSIS — I5032 Chronic diastolic (congestive) heart failure: Secondary | ICD-10-CM

## 2020-04-08 MED ORDER — FUROSEMIDE 40 MG PO TABS: 40.0000 mg | ORAL_TABLET | Freq: Two times a day (BID) | ORAL | 0 refills | Status: DC

## 2020-04-15 ENCOUNTER — Other Ambulatory Visit: Payer: Self-pay | Admitting: Internal Medicine

## 2020-04-15 DIAGNOSIS — G43009 Migraine without aura, not intractable, without status migrainosus: Secondary | ICD-10-CM

## 2020-04-15 DIAGNOSIS — K7290 Hepatic failure, unspecified without coma: Secondary | ICD-10-CM

## 2020-04-15 DIAGNOSIS — K7682 Hepatic encephalopathy: Secondary | ICD-10-CM

## 2020-04-15 DIAGNOSIS — F409 Phobic anxiety disorder, unspecified: Secondary | ICD-10-CM

## 2020-04-15 DIAGNOSIS — F5105 Insomnia due to other mental disorder: Secondary | ICD-10-CM

## 2020-04-22 ENCOUNTER — Other Ambulatory Visit: Payer: Self-pay

## 2020-04-22 ENCOUNTER — Emergency Department (HOSPITAL_COMMUNITY): Payer: Medicare Other

## 2020-04-22 ENCOUNTER — Inpatient Hospital Stay (HOSPITAL_COMMUNITY)
Admission: EM | Admit: 2020-04-22 | Discharge: 2020-04-25 | DRG: 392 | Disposition: A | Discharge status: Home / Self Care | Payer: Medicare Other | Attending: Internal Medicine | Admitting: Internal Medicine

## 2020-04-22 DIAGNOSIS — K5909 Other constipation: Secondary | ICD-10-CM | POA: Diagnosis present

## 2020-04-22 DIAGNOSIS — N1831 Chronic kidney disease, stage 3a: Secondary | ICD-10-CM | POA: Diagnosis present

## 2020-04-22 DIAGNOSIS — M810 Age-related osteoporosis without current pathological fracture: Secondary | ICD-10-CM | POA: Diagnosis present

## 2020-04-22 DIAGNOSIS — Z7982 Long term (current) use of aspirin: Secondary | ICD-10-CM | POA: Diagnosis not present

## 2020-04-22 DIAGNOSIS — I2510 Atherosclerotic heart disease of native coronary artery without angina pectoris: Secondary | ICD-10-CM | POA: Diagnosis present

## 2020-04-22 DIAGNOSIS — Z9049 Acquired absence of other specified parts of digestive tract: Secondary | ICD-10-CM

## 2020-04-22 DIAGNOSIS — Z79899 Other long term (current) drug therapy: Secondary | ICD-10-CM

## 2020-04-22 DIAGNOSIS — Z9981 Dependence on supplemental oxygen: Secondary | ICD-10-CM

## 2020-04-22 DIAGNOSIS — Z853 Personal history of malignant neoplasm of breast: Secondary | ICD-10-CM

## 2020-04-22 DIAGNOSIS — Z955 Presence of coronary angioplasty implant and graft: Secondary | ICD-10-CM | POA: Diagnosis not present

## 2020-04-22 DIAGNOSIS — K219 Gastro-esophageal reflux disease without esophagitis: Secondary | ICD-10-CM | POA: Diagnosis present

## 2020-04-22 DIAGNOSIS — K56609 Unspecified intestinal obstruction, unspecified as to partial versus complete obstruction: Secondary | ICD-10-CM

## 2020-04-22 DIAGNOSIS — Z20822 Contact with and (suspected) exposure to covid-19: Secondary | ICD-10-CM | POA: Diagnosis present

## 2020-04-22 DIAGNOSIS — K439 Ventral hernia without obstruction or gangrene: Secondary | ICD-10-CM | POA: Diagnosis present

## 2020-04-22 DIAGNOSIS — I5032 Chronic diastolic (congestive) heart failure: Secondary | ICD-10-CM | POA: Diagnosis present

## 2020-04-22 DIAGNOSIS — F17210 Nicotine dependence, cigarettes, uncomplicated: Secondary | ICD-10-CM | POA: Diagnosis present

## 2020-04-22 DIAGNOSIS — Z7951 Long term (current) use of inhaled steroids: Secondary | ICD-10-CM

## 2020-04-22 DIAGNOSIS — F411 Generalized anxiety disorder: Secondary | ICD-10-CM | POA: Diagnosis present

## 2020-04-22 DIAGNOSIS — J441 Chronic obstructive pulmonary disease with (acute) exacerbation: Secondary | ICD-10-CM | POA: Diagnosis present

## 2020-04-22 DIAGNOSIS — Z83438 Family history of other disorder of lipoprotein metabolism and other lipidemia: Secondary | ICD-10-CM

## 2020-04-22 DIAGNOSIS — I252 Old myocardial infarction: Secondary | ICD-10-CM

## 2020-04-22 DIAGNOSIS — Z0189 Encounter for other specified special examinations: Secondary | ICD-10-CM

## 2020-04-22 DIAGNOSIS — Z8249 Family history of ischemic heart disease and other diseases of the circulatory system: Secondary | ICD-10-CM

## 2020-04-22 DIAGNOSIS — F419 Anxiety disorder, unspecified: Secondary | ICD-10-CM | POA: Diagnosis not present

## 2020-04-22 DIAGNOSIS — I130 Hypertensive heart and chronic kidney disease with heart failure and stage 1 through stage 4 chronic kidney disease, or unspecified chronic kidney disease: Secondary | ICD-10-CM | POA: Diagnosis present

## 2020-04-22 DIAGNOSIS — J9611 Chronic respiratory failure with hypoxia: Secondary | ICD-10-CM | POA: Diagnosis present

## 2020-04-22 DIAGNOSIS — Z833 Family history of diabetes mellitus: Secondary | ICD-10-CM | POA: Diagnosis not present

## 2020-04-22 DIAGNOSIS — N183 Chronic kidney disease, stage 3 unspecified: Secondary | ICD-10-CM | POA: Diagnosis present

## 2020-04-22 HISTORY — DX: Unspecified intestinal obstruction, unspecified as to partial versus complete obstruction: K56.609

## 2020-04-22 LAB — TROPONIN I (HIGH SENSITIVITY)
Troponin I (High Sensitivity): 11 ng/L (ref <18)
Troponin I (High Sensitivity): 10 ng/L (ref <18)

## 2020-04-22 LAB — BASIC METABOLIC PANEL
Anion gap: 13 (ref 5–15)
BUN: 15 mg/dL (ref 8–23)
CO2: 34 mmol/L — ABNORMAL HIGH (ref 22–32)
Calcium: 10.7 mg/dL — ABNORMAL HIGH (ref 8.9–10.3)
Chloride: 94 mmol/L — ABNORMAL LOW (ref 98–111)
Creatinine, Ser: 1.21 mg/dL — ABNORMAL HIGH (ref 0.44–1.00)
GFR calc Af Amer: 51 mL/min — ABNORMAL LOW (ref >60)
GFR calc non Af Amer: 44 mL/min — ABNORMAL LOW (ref >60)
Glucose, Bld: 123 mg/dL — ABNORMAL HIGH (ref 70–99)
Potassium: 3.5 mmol/L (ref 3.5–5.1)
Sodium: 141 mmol/L (ref 135–145)

## 2020-04-22 LAB — CBC
HCT: 42.5 % (ref 36.0–46.0)
Hemoglobin: 13.3 g/dL (ref 12.0–15.0)
MCH: 31.3 pg (ref 26.0–34.0)
MCHC: 31.3 g/dL (ref 30.0–36.0)
MCV: 100 fL (ref 80.0–100.0)
Platelets: 258 10*3/uL (ref 150–400)
RBC: 4.25 MIL/uL (ref 3.87–5.11)
RDW: 13.2 % (ref 11.5–15.5)
WBC: 11.3 10*3/uL — ABNORMAL HIGH (ref 4.0–10.5)
nRBC: 0 % (ref 0.0–0.2)

## 2020-04-22 MED ORDER — MORPHINE SULFATE (PF) 2 MG/ML IV SOLN: 1.0000 mg | INTRAVENOUS | Status: DC | PRN

## 2020-04-22 MED ORDER — MORPHINE SULFATE (PF) 2 MG/ML IV SOLN
2.0000 mg | Freq: Once | INTRAVENOUS | Status: AC
  Administered 2020-04-22: 2 mg via INTRAVENOUS
  Filled 2020-04-22: qty 1

## 2020-04-22 MED ORDER — IOHEXOL 300 MG/ML  SOLN
100.0000 mL | Freq: Once | INTRAMUSCULAR | Status: AC | PRN
  Administered 2020-04-22: 100 mL via INTRAVENOUS

## 2020-04-22 MED ORDER — IOHEXOL 300 MG/ML  SOLN: 100.0000 mL | Freq: Once | INTRAMUSCULAR | Status: DC | PRN

## 2020-04-22 MED ORDER — SODIUM CHLORIDE 0.9% FLUSH
3.0000 mL | Freq: Once | INTRAVENOUS | Status: AC
  Administered 2020-04-22: 3 mL via INTRAVENOUS

## 2020-04-22 MED ORDER — SODIUM CHLORIDE 0.9 % IV SOLN: 250.0000 mL | INTRAVENOUS | Status: DC | PRN

## 2020-04-22 MED ORDER — ONDANSETRON HCL 4 MG/2ML IJ SOLN
4.0000 mg | Freq: Once | INTRAMUSCULAR | Status: DC
  Filled 2020-04-22: qty 2

## 2020-04-22 MED ORDER — METHYLPREDNISOLONE SODIUM SUCC 40 MG IJ SOLR
40.0000 mg | Freq: Three times a day (TID) | INTRAMUSCULAR | Status: DC
  Administered 2020-04-23 – 2020-04-25 (×8): 40 mg via INTRAVENOUS
  Filled 2020-04-22 (×8): qty 1

## 2020-04-22 MED ORDER — SODIUM CHLORIDE 0.9 % IV BOLUS
500.0000 mL | Freq: Once | INTRAVENOUS | Status: AC
  Administered 2020-04-23: 500 mL via INTRAVENOUS

## 2020-04-22 MED ORDER — ONDANSETRON HCL 4 MG/2ML IJ SOLN
4.0000 mg | Freq: Four times a day (QID) | INTRAMUSCULAR | Status: DC | PRN
  Administered 2020-04-22: 4 mg via INTRAVENOUS
  Filled 2020-04-22: qty 2

## 2020-04-22 MED ORDER — SODIUM CHLORIDE 0.9% FLUSH: 3.0000 mL | INTRAVENOUS | Status: DC | PRN

## 2020-04-22 MED ORDER — FENTANYL CITRATE (PF) 100 MCG/2ML IJ SOLN
25.0000 ug | Freq: Once | INTRAMUSCULAR | Status: AC
  Administered 2020-04-22: 25 ug via INTRAVENOUS
  Filled 2020-04-22: qty 2

## 2020-04-22 MED ORDER — ALBUTEROL SULFATE HFA 108 (90 BASE) MCG/ACT IN AERS
6.0000 | INHALATION_SPRAY | Freq: Once | RESPIRATORY_TRACT | Status: AC
  Administered 2020-04-22: 6 via RESPIRATORY_TRACT
  Filled 2020-04-22: qty 6.7

## 2020-04-22 MED ORDER — DICLOFENAC SODIUM 1 % TD GEL: 4.0000 g | TRANSDERMAL | Status: DC | PRN

## 2020-04-22 MED ORDER — DIATRIZOATE MEGLUMINE & SODIUM 66-10 % PO SOLN
90.0000 mL | Freq: Once | ORAL | Status: AC
  Administered 2020-04-23: 90 mL via NASOGASTRIC
  Filled 2020-04-22 (×2): qty 90

## 2020-04-22 MED ORDER — ALBUTEROL SULFATE HFA 108 (90 BASE) MCG/ACT IN AERS
2.0000 | INHALATION_SPRAY | RESPIRATORY_TRACT | Status: DC | PRN
  Filled 2020-04-22: qty 6.7

## 2020-04-22 MED ORDER — SODIUM CHLORIDE 0.9% FLUSH
3.0000 mL | Freq: Two times a day (BID) | INTRAVENOUS | Status: DC
  Administered 2020-04-23 – 2020-04-24 (×3): 3 mL via INTRAVENOUS

## 2020-04-22 MED ORDER — IPRATROPIUM-ALBUTEROL 0.5-2.5 (3) MG/3ML IN SOLN: 3.0000 mL | Freq: Four times a day (QID) | RESPIRATORY_TRACT | Status: DC

## 2020-04-22 MED ORDER — LORAZEPAM 2 MG/ML IJ SOLN
0.5000 mg | Freq: Three times a day (TID) | INTRAMUSCULAR | Status: DC | PRN
  Administered 2020-04-23 (×2): 0.5 mg via INTRAVENOUS
  Filled 2020-04-22 (×2): qty 1

## 2020-04-22 MED ORDER — PROMETHAZINE HCL 25 MG/ML IJ SOLN
12.5000 mg | Freq: Four times a day (QID) | INTRAMUSCULAR | Status: DC | PRN
  Administered 2020-04-23: 12.5 mg via INTRAVENOUS
  Filled 2020-04-22: qty 1

## 2020-04-22 MED ORDER — MOMETASONE FURO-FORMOTEROL FUM 200-5 MCG/ACT IN AERO
2.0000 | INHALATION_SPRAY | Freq: Two times a day (BID) | RESPIRATORY_TRACT | Status: DC
  Administered 2020-04-23 – 2020-04-25 (×4): 2 via RESPIRATORY_TRACT
  Filled 2020-04-22: qty 8.8

## 2020-04-22 NOTE — H&P (Signed)
History and Physical    [name] EAV:[id number] DOB: 05-Feb-1946 DOA: 04/22/2020  PCP: [name], MD   Patient coming from: Home   Chief Complaint: N/V, pain in abd, back, and chest   HPI: [name] is a 74 y.o. female with medical history significant for COPD, chronic 3 L/min supplemental oxygen requirement, anxiety, chronic diastolic CHF, CAD, and history of partial colectomy, now presenting to the emergency department with 1 day of abdominal pain, back pain, chest discomfort, nausea, and vomiting.  Patient reports developing severe pain yesterday with nausea and nonbloody vomiting continued to worsen overnight and into today.  She denies any fevers or chills.  She reports feeling and hearing a "pop" at her ventral hernia when vomiting. With symptoms continuing to worsen, she called EMS. She was found to be dyspneic and wheezing with EMS and was treated with albuterol and 125 mg IV Solu-Medrol prior to arrival in ED.   ED Course: Upon arrival to the ED, patient is found to be afebrile, saturating upper 90s on 4 L/min supplemental oxygen, and with stable blood pressure.  EKG features sinus rhythm with PACs.  Chest x-ray negative for acute cardiopulmonary disease.  CT is concerning for early or partial SBO related to her large ventral hernia.  Chemistry panel notable for creatinine 1.21.  CBC with mild leukocytosis.  High-sensitivity troponin is normal x2.  Surgery was consulted by the ED physician and the patient was treated with saline bolus, morphine, fentanyl, Zofran, and albuterol.  Review of Systems:  All other systems reviewed and apart from HPI, are negative.  Past Medical History:  Diagnosis Date  . Abnormal LFTs 08/02/2011  . Acute liver failure 06/19/2013  . Acute renal failure ([place]) 06/19/2013  . Acute respiratory failure ([place]) 06/19/2013  . Altered mental status 08/01/2011  . Angina   . Anxiety   . Anxiety state, unspecified 12/03/2013  . Aortic stenosis    mild    . Arthritis    "hands" (02/03/2017)  . Breast cancer, right breast ([place]) dx'd 2008  . CAD (coronary artery disease) of artery bypass graft 11/06/2013  . CAD (coronary artery disease), MI R/O 08/01/2011   PCI in 2006 (bare metal stent, unknown artery) - [place]   . CAP (community acquired pneumonia) 09/11/2018  . CHF,  acute diastolic, BNP 4k on admissio 08/01/2011  . Chronic bronchitis ([place])   . Chronic respiratory failure ([place]) 02/02/2012  . Compression fracture of L1 lumbar vertebra ([place]) 02/02/2012  . COPD (chronic obstructive pulmonary disease) ([facility])    oxygen-dependent 4LPM [place]  . Coronary artery disease 2006   2 stents w/previous MI  . Depressed   . Diastolic CHF ([place])   . Dyslipidemia   . Family history of adverse reaction to anesthesia    "daughter had c-section; missed twice w/epidural"  . GERD (gastroesophageal reflux disease)   . Heart murmur   . History of blood transfusion    "w/my colon OR"  . 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 History of nuclear stress test 08/03/2011   attenuation at apex - no perfusion defects   . HTN (hypertension) 11/06/2013  . Hyperkalemia, on ACE prior to admission 11/11/2011  . Hypertension   . Hyponatremia 01/31/2012  . Migraine    "qod to q couple months since I was 21" (02/03/2017)  . Mild aortic stenosis 08/01/2011   AVA 1.69 cm2 (06/02/2011)   . Moderate to severe pulmonary hypertension ([place])   . NSTEMI (non-ST elevated myocardial  infarction) ([place]) 2006  . NSVT (nonsustained ventricular tachycardia) (H[facility])    h/o  . On home oxygen therapy    "3L just at night; have it available prn duringtheday" (09/11/2018)  . Pneumonia    "alot of times" (02/03/2017)    Past Surgical History:  Procedure Laterality Date  . BREAST BIOPSY Right 2008  . BREAST LUMPECTOMY Right 2008   malignant  . CATARACT EXTRACTION W/ INTRAOCULAR LENS  IMPLANT, BILATERAL Bilateral ~ 2013  . [place]; 1971; 1978  . CHOLECYSTECTOMY OPEN  2011  . COLON SURGERY     . COLOSTOMY  11/2007  . COLOSTOMY CLOSURE  07/2008  . CORONARY ANGIOPLASTY WITH STENT PLACEMENT  2006   "2 stents"  . ERCP N/A 09/05/2015   Procedure: ENDOSCOPIC RETROGRADE CHOLANGIOPANCREATOGRAPHY (ERCP);  Surgeon: Ladene Artist, MD;  Location: [facility];  Service: Endoscopy;  Laterality: N/A;  . PARTIAL COLECTOMY  2009   for obstruction: temporary ostomy, later reversed.   [facility] [place] RIGHT HEART CATHETERIZATION N/A 09/05/2013   Procedure: RIGHT HEART CATH;  Surgeon: Jolaine Artist, MD;  Location: [facility] C[facility] LAB;  Service: Cardiovascular;  Laterality: N/A;  . TRANSTHORACIC ECHOCARDIOGRAM  11/12/2011   EF 84-16%, normal systolic function, grade 1 diastolic dysfunction; ventricular septal flattening (D-sign); mild AS; trace-mild MR; LA mildly dilated; RV mod dilated; RA mod dilated; severe pulm HTN; elevated CVP    Social History:   reports that she has been smoking cigarettes. She has a 26.50 pack-year smoking history. She has never used smokeless tobacco. She reports current alcohol use of about 1.0 standard drink of alcohol per week. She reports that she does not use drugs.  Allergies  Allergen Reactions  . Ceftriaxone Anaphylaxis and Other (See Comments)    *ROCEPHIN*  "Blow up like a balloon"  . Hydroxyzine Shortness Of Breath and Other (See Comments)    Pt states med make her light headed, get sob sxs  . Doxycycline Nausea And Vomiting  . Lexapro [Escitalopram] Other (See Comments)    Pt states med make her dizzy    Family History  Problem Relation Age of Onset  . Schizophrenia Sister   . Alzheimer's disease Mother   . Hyperlipidemia Brother   . Heart disease Sister   . Diabetes Sister   . Cancer Neg Hx   . Stroke Neg Hx   . COPD Neg Hx   . Depression Neg Hx   . Drug abuse Neg Hx   . Early death Neg Hx   . Hypertension Neg Hx   . Kidney disease Neg Hx      Prior to Admission medications   Medication Sig Start Date End Date Taking? Authorizing Provider  ALPRAZolam  (XANAX) 1 MG tablet TAKE 1 TABLET BY MOUTH THREE TIMES A DAY AS NEEDED Patient taking differently: Take 1 mg by mouth 3 (three) times daily.  02/13/20  Yes [name], MD  Artificial Tear Ointment (DRY EYES OP) Apply 2 drops to eye daily as needed (dry eyes).    Yes [provider]  aspirin 325 MG EC tablet Take 1 tablet (325 mg total) by mouth every morning. Patient taking differently: Take 325 mg by mouth daily.  09/21/15  Yes Velvet Bathe, MD  ATROVENT HFA 17 MCG/ACT inhaler TAKE 2 PUFFS BY MOUTH EVERY 4 HOURS AS NEEDED FOR WHEEZE Patient taking differently: Inhale 2 puffs into the lungs every 4 (four) hours as needed for wheezing.  03/19/20  Yes [name], MD  butalbital-acetaminophen-caffeine (FIORICET) 50-325-40 MG tablet TAKE 1 TABLET BY MOUTH EVERY 4 (FOUR) HOURS AS NEEDED FOR HEADACHE. 04/15/20  Yes [name], MD  Calcium Carbonate (CALCIUM 600 PO) Take 1 tablet by mouth daily.    Yes [provider]  cholecalciferol (VITAMIN D) 1000 units tablet Take 1,000 Units by mouth daily.   Yes [provider]  diclofenac sodium (VOLTAREN) 1 % GEL Apply 4 g topically as needed (for pain). 01/14/19  Yes [name], MD  furosemide (LASIX) 40 MG tablet Take 1 tablet (40 mg total) by mouth 2 (two) times daily. 04/08/20  Yes [name], MD  ipratropium-albuterol (DUONEB) 0.5-2.5 (3) MG/3ML SOLN Inhale 3 mLs into the lungs 2 (two) times daily as needed (for SOB). 05/01/19  Yes [name], MD  KLOR-CON M20 20 MEQ tablet TAKE 1 TABLET BY MOUTH EVERY DAY Patient taking differently: Take 20 mEq by mouth daily.  04/15/20  Yes [name], MD  lactulose, encephalopathy, (ENULOSE) 10 GM/15ML SOLN TAKE 45 MILLILITERS BY MOUTH 3 TIMES A DAY AS NEEDED FOR CONSTIPATION Patient taking differently: Take 30 g by mouth See admin instructions. TAKE 45 MILLILITERS BY MOUTH 3 TIMES A DAY AS NEEDED FOR CONSTIPATION 04/15/20  Yes [name], MD  levocetirizine  (XYZAL) 5 MG tablet Take 1 tablet (5 mg total) by mouth every evening. 05/05/19  Yes [name], MD  montelukast (SINGULAIR) 10 MG tablet TAKE 1 TABLET BY MOUTH EVERYDAY AT BEDTIME Patient taking differently: Take 10 mg by mouth at bedtime.  10/12/19  Yes [name], MD  nortriptyline (PAMELOR) 25 MG capsule TAKE 2 CAPSULES BY MOUTH AT BEDTIME Patient taking differently: Take 25 mg by mouth at bedtime.  04/15/20  Yes [name], MD  Omega 3 1200 MG CAPS Take 1,200 mg by mouth every morning.    Yes [provider]  ondansetron (ZOFRAN) 8 MG tablet TAKE 1 TABLET BY MOUTH EVERY 8 HOURS AS NEEDED FOR NAUSEA Patient taking differently: Take 8 mg by mouth every 8 (eight) hours as needed for nausea.  11/24/18  Yes [name], MD  OXYGEN-HELIUM IN Inhale 3 L into the lungs See admin instructions. Uses when needed during the day, uses continuous throughout the night   Yes [provider]  pantoprazole (PROTONIX) 40 MG tablet TAKE 1 TABLET BY MOUTH EVERY DAY Patient taking differently: Take 40 mg by mouth daily.  03/06/20  Yes [name], MD  [name] 60 MG/ML SOSY injection TO BE ADMINISTERED IN PHYSICIAN'S OFFICE. INJECT ONE SYRINGE SUBCOUSLY ONCE EVERY 6 MONTHS. REFRIGERATE. USE WITHIN 14 DAYS ONCE AT ROOM TEMPERATURE. Patient taking differently: Inject 60 mg into the skin every 6 (six) months.  08/29/19  Yes [name], MD  VENTOLIN HFA 108 (90 Base) MCG/ACT inhaler INHALE 2 PUFFS BY MOUTH EVERY 4 HOURS AS NEEDED Patient taking differently: Inhale 2 puffs into the lungs every 4 (four) hours as needed for wheezing or shortness of breath.  02/27/20  Yes [name], MD  vitamin C (ASCORBIC ACID) 500 MG tablet Take 500 mg by mouth daily.   Yes [provider]  fluticasone (FLONASE) 50 MCG/ACT nasal spray USE 2 SPRAYS INTO EACH NOSTRIL ONCE DAILY**REPEAT FOR 5 DAYS THEN STOP Patient not taking: Reported on 04/22/2020 04/18/16   [name], MD  LIVALO 2 MG  TABS TAKE 1 TABLET BY MOUTH EVERY DAY Patient taking differently: Take 2 mg by mouth daily.  11/16/19   [name], MD  SYMBICORT 160-4.5 MCG/ACT inhaler TAKE 2 PUFFS BY MOUTH TWICE A DAY IN TO THE LUNGS Patient taking differently: Inhale 2 puffs into the lungs in the morning and at bedtime.  03/19/20   [name], MD  KLOR-CON M20 20 MEQ tablet TAKE 1 TABLET BY MOUTH EVERY DAY 06/24/19   [name], MD  LIVALO 2 MG TABS TAKE 1 TABLET BY MOUTH EVERY DAY 06/24/19   [name], MD  nortriptyline (PAMELOR) 25 MG capsule TAKE 2 CAPSULES AT BEDTIME 06/24/19   [name], MD    Physical Exam: Vitals:   04/22/20 2131 04/22/20 2230 04/22/20 2245 04/22/20 2315  BP: (!) 120/59 (!) 139/63 (!) 134/79 (!) 115/100  Pulse: 79 78 75 91  Resp: 19 15 12 15   Temp:      TempSrc:      SpO2: 99% 99% 99% 100%  Weight:      Height:        Constitutional: NAD, calm  Eyes: PERTLA, lids and conjunctivae normal ENMT: Mucous membranes are moist. Posterior pharynx clear of any exudate or lesions.   Neck: normal, supple, no masses, no thyromegaly Respiratory: Diminished bilaterally with prolonged expiratory phase and wheezing. No accessory muscle use.  Cardiovascular: S1 & S2 heard, regular rate and rhythm. No extremity edema. No significant JVD. Abdomen: Tender without rebound pain or guarding. Soft ventral hernia without discoloration.    Musculoskeletal: no clubbing / cyanosis. No joint deformity upper and lower extremities.   Skin: no significant rashes, lesions, ulcers. Warm, dry, well-perfused. Neurologic: No facial asymmetry. Sensation intact. Moving all extremities.   Psychiatric: Alert and oriented to person, place, and situation. Calm and cooperative.    Labs and Imaging on Admission: I have personally reviewed following labs and imaging studies  CBC: Recent Labs  Lab 04/22/20 1150  WBC 11.3*  HGB 13.3  HCT 42.5  MCV 100.0  PLT 726   Basic Metabolic Panel: Recent Labs    Lab 04/22/20 1150  NA 141  K 3.5  CL 94*  CO2 34*  GLUCOSE 123*  BUN 15  CREATININE 1.21*  CALCIUM 10.7*   GFR: Estimated Creatinine Clearance: 32.1 mL/min (A) (by C-G formula based on SCr of 1.21 mg/dL (H)). Liver Function Tests: No results for input(s): AST, ALT, ALKPHOS, BILITOT, PROT, ALBUMIN in the last 168 hours. No results for input(s): LIPASE, AMYLASE in the last 168 hours. No results for input(s): AMMONIA in the last 168 hours. Coagulation Profile: No results for input(s): INR, PROTIME in the last 168 hours. Cardiac Enzymes: No results for input(s): CKTOTAL, CKMB, CKMBINDEX, TROPONINI in the last 168 hours. BNP (last 3 results) No results for input(s): PROBNP in the last 8760 hours. HbA1C: No results for input(s): HGBA1C in the last 72 hours. CBG: No results for input(s): GLUCAP in the last 168 hours. Lipid Profile: No results for input(s): CHOL, HDL, LDLCALC, TRIG, CHOLHDL, LDLDIRECT in the last 72 hours. Thyroid Function Tests: No results for input(s): TSH, T4TOTAL, FREET4, T3FREE, THYROIDAB in the last 72 hours. Anemia Panel: No results for input(s): VITAMINB12, FOLATE, FERRITIN, TIBC, IRON, RETICCTPCT in the last 72 hours. Urine analysis:    Component Value Date/Time   COLORURINE YELLOW 12/19/2013 [place] 12/19/2013 1127   LABSPEC <=1.005 (A) 12/19/2013 1127   PHURINE 7.0 12/19/2013 1127   GLUCOSEU NEGATIVE 12/19/2013 1127   HGBUR NEGATIVE 12/19/2013 1127   [place] 12/19/2013 1127  KETONESUR NEGATIVE 12/19/2013 1127   PROTEINUR >300 (A) 06/20/2013 0059   UROBILINOGEN 0.2 12/19/2013 1127   NITRITE NEGATIVE 12/19/2013 1127   LEUKOCYTESUR MODERATE (A) 12/19/2013 1127   Sepsis Labs: @LABRCNTIP (procalcitonin:4,lacticidven:4) )No results found for this or any previous visit (from the past 240 hour(s)).   Radiological Exams on Admission: DG Chest 2 View  Result Date: 04/22/2020 CLINICAL DATA:  Chest pain. EXAM: CHEST - 2  VIEW COMPARISON:  Prior chest radiographs 09/11/2018 and earlier FINDINGS: Unchanged mild cardiomegaly. Aortic atherosclerosis. No appreciable airspace consolidation or pulmonary edema. No sizable pleural effusion or evidence of pneumothorax. No acute bony abnormality identified. Multiple metallic clips project in the region of the right axilla. Additionally, surgical clips are present within the upper abdomen. IMPRESSION: No evidence of acute cardiopulmonary abnormality. Unchanged mild cardiomegaly. Aortic Atherosclerosis (ICD10-I70.0). Electronically Signed   By: Kellie Simmering DO   On: 04/22/2020 12:09   CT ABDOMEN PELVIS W CONTRAST  Result Date: 04/22/2020 CLINICAL DATA:  74 year old female with history of abdominal pain. Suspected hernia. EXAM: CT ABDOMEN AND PELVIS WITH CONTRAST TECHNIQUE: Multidetector CT imaging of the abdomen and pelvis was performed using the standard protocol following bolus administration of intravenous contrast. CONTRAST:  157mL OMNIPAQUE IOHEXOL 300 MG/ML  SOLN COMPARISON:  CT the abdomen and pelvis 09/04/2015. FINDINGS: Lower chest: Atherosclerotic calcifications in the left anterior descending, left circumflex and right coronary arteries. Severe calcifications of the aortic valve. Mild scarring and pleural thickening in the lung bases bilaterally. Hepatobiliary: No suspicious cystic or solid hepatic lesions. No intra or extrahepatic biliary ductal dilatation. Status post cholecystectomy. Pancreas: No pancreatic mass. No pancreatic ductal dilatation. No pancreatic or peripancreatic fluid collections or inflammatory changes. Spleen: Unremarkable. Adrenals/Urinary Tract: Low-attenuation lesions in both kidneys measuring up to 1.4 cm in the lower pole of the right kidney, compatible with cysts. Small amount of parenchymal thinning in the posterior aspect of the interpolar region of the left kidney, presumably post infectious scarring. No suspicious renal lesions. No  hydroureteronephrosis. Bilateral adrenal glands are normal in appearance. Urinary bladder is unremarkable in appearance. Stomach/Bowel: Normal appearance of the stomach. Multiple dilated loops of mid to distal small bowel which measure up to 4.3 cm in diameter in the low anatomic pelvis where there is also some feculent contents in the small bowel, indicative of some small bowel stasis. Segment of mid to distal small bowel extends into a moderate to large infraumbilical ventral hernia. Postoperative changes of what appears to be partial right hemicolectomy. Some gas and stool is noted throughout the colon and rectum. Vascular/Lymphatic: Aortic atherosclerosis, without evidence of aneurysm or dissection in the abdominal or pelvic vasculature. No lymphadenopathy noted in the abdomen or pelvis. Reproductive: Uterus and ovaries are unremarkable in appearance. Other: No significant volume of ascites.  No pneumoperitoneum. Musculoskeletal: There are no aggressive appearing lytic or blastic lesions noted in the visualized portions of the skeleton. Chronic appearing compression fractures of L1 and L3 are noted with 25% loss of anterior vertebral body height at both levels, similar to the prior study. IMPRESSION: 1. Findings are compatible with early or partial small bowel obstruction related to a moderate to large infraumbilical ventral hernia. Surgical consultation is suggested. 2. Aortic atherosclerosis, in addition to 3 vessel coronary artery disease. Please note that although the presence of coronary artery calcium documents the presence of coronary artery disease, the severity of this disease and any potential stenosis cannot be assessed on this non-gated CT examination. Assessment for potential risk factor modification, dietary therapy  or pharmacologic therapy may be warranted, if clinically indicated. 3. There are calcifications of the aortic valve. Echocardiographic correlation for evaluation of potential valvular  dysfunction may be warranted if clinically indicated. 4. Additional incidental findings, as above. Electronically Signed   By: Vinnie Langton M.D.   On: 04/22/2020 19:06    EKG: Independently reviewed. Sinus rhythm, PACs.   Assessment/Plan   1. SBO  - Presents with abdominal pain and N/V and is found to have early or partial SBO related to her large ventral hernia  - Surgery consulting and much appreciated   - Continue bowel-rest, NGT decompression, pain-control    2. COPD with acute exacerbation; chronic hypoxic respiratory failure  - Patient has increased cough and wheezing, was treated with IV Solu-Medrol and albuterol prior to arrival in ED, given additional albuterol in ED, and has clear CXR  - Check sputum culture, continue systemic steroid, start azithromycin, continue inhalers and supplemental O2    3. Chronic diastolic CHF  - Appears compensated  - Continue gentle IVF hydration while NPO, monitor daily wt and I/Os    4. CAD - Patient reported chest pain in ED but gestures towards abdomen when describing it, reports improvement with vomiting, has normal HS troponin x2, and this was likely secondary to the SBO    5. CKD IIIa  - SCr is 1.21 on admission   - Renally-dose medications, monitor     DVT prophylaxis: SCDs  Code Status: Full  Family Communication: Discussed with patient  Disposition Plan:  Patient is from: Home  Anticipated d/c is to: TBD  Anticipated d/c date is: 04/25/20 Patient currently: Pending surgical consultation  Consults called: Surgery consulted by ED physician  Admission status: Inpatient     Vianne Bulls, MD [facility]  04/22/2020, 11:57 PM

## 2020-04-22 NOTE — ED Provider Notes (Signed)
Dr. Myna Hidalgo of the hospital service will evaluate for admission. Dr. Bobbye Morton of surgery is aware of case and will evaluate.    Valarie Merino, MD 04/22/20 [phone]

## 2020-04-22 NOTE — ED Notes (Signed)
Pt up to bedside commode assisted by ED staff member, per this RN response to pt call light pt found in bed with IV removed actively bleeding. Pt bleeding controlled with pressure, site cleaned and pressure dressing applied to IV site.

## 2020-04-22 NOTE — ED Notes (Signed)
Kim (Daughter#[phone]) called for an update.  She also would like nurse to let her mother know she have called.  Thank you

## 2020-04-22 NOTE — ED Notes (Signed)
Pt desat on Room air to 80%, pt placed on 4L O2 via [place] pt SPO2 improved 100%, pt denies SOB, no noted work of breathing. Pt reports wearing O2 at home at bedtime.

## 2020-04-22 NOTE — Consult Note (Signed)
Reason for Consult/Chief Complaint:  Consultant: Kenton Kingfisher, PA  [name] is an 74 y.o. female.   HPI: 83F with a history of abdominal pain, nausea, and vomiting that began the afternoon of 09-May-2020. Has passed flatus as recently as today and last BM was May 09, 2020 just prior to the onset of symptoms. Reports having issues with constipation at baseline, requiring manual disimpaction as recently as May 09, 2020 and takes lactulose daily for this. In March 2009, she reports having had a partial colectomy and ileostomy due to a bowel obstruction after adjuvant therapy for breast cancer and that she developed a hernia in 2009, shortly after this procedure, which has enlarged over time.   Decision-maker: Katrinna Travieso, daughter [phone]  Past Medical History:  Diagnosis Date  . Abnormal LFTs 08/02/2011  . Acute liver failure 06/19/2013  . Acute renal failure ([place]) 06/19/2013  . Acute respiratory failure ([place]) 06/19/2013  . Altered mental status 08/01/2011  . Angina   . Anxiety   . Anxiety state, unspecified 12/03/2013  . Aortic stenosis    mild  . Arthritis    "hands" (02/03/2017)  . Breast cancer, right breast ([facility]) dx'd 2008  . CAD (coronary artery disease) of artery bypass graft 11/06/2013  . CAD (coronary artery disease), MI R/O 08/01/2011   PCI in 2006 (bare metal stent, unknown artery) - [place]   . CAP (community acquired pneumonia) 09/11/2018  . CHF,  acute diastolic, BNP 4k on admissio 08/01/2011  . Chronic bronchitis ([place])   . Chronic respiratory failure ([facility]) 02/02/2012  . Compression fracture of L1 lumbar vertebra ([facility]) 02/02/2012  . COPD (chronic obstructive pulmonary disease) (H[facility])    oxygen-dependent 4LPM [place]  . Coronary artery disease 2006   2 stents w/previous MI  . Depressed   . Diastolic CHF ([place])   . Dyslipidemia   . Family history of adverse reaction to anesthesia    "daughter had c-section; missed twice w/epidural"  . GERD (gastroesophageal reflux disease)   .  Heart murmur   . History of blood transfusion    "w/my colon OR"  . 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 History of nuclear stress test 08/03/2011   attenuation at apex - no perfusion defects   . HTN (hypertension) 11/06/2013  . Hyperkalemia, on ACE prior to admission 11/11/2011  . Hypertension   . Hyponatremia 01/31/2012  . Migraine    "qod to q couple months since I was 21" (02/03/2017)  . Mild aortic stenosis 08/01/2011   AVA 1.69 cm2 (06/02/2011)   . Moderate to severe pulmonary hypertension ([place])   . NSTEMI (non-ST elevated myocardial infarction) ([facility]) 2006  . NSVT (nonsustained ventricular tachycardia) (H[facility])    h/o  . On home oxygen therapy    "3L just at night; have it available prn duringtheday" (09/11/2018)  . Pneumonia    "alot of times" (02/03/2017)    Past Surgical History:  Procedure Laterality Date  . BREAST BIOPSY Right 2008  . BREAST LUMPECTOMY Right 2008   malignant  . CATARACT EXTRACTION W/ INTRAOCULAR LENS  IMPLANT, BILATERAL Bilateral ~ 2013  . [place]; 1971; 1978  . CHOLECYSTECTOMY OPEN  2011  . COLON SURGERY    . COLOSTOMY  11/2007  . COLOSTOMY CLOSURE  07/2008  . CORONARY ANGIOPLASTY WITH STENT PLACEMENT  2006   "2 stents"  . ERCP N/A 09/05/2015   Procedure: ENDOSCOPIC RETROGRADE CHOLANGIOPANCREATOGRAPHY (ERCP);  Surgeon: Ladene Artist, MD;  Location: [facility];  Service: Endoscopy;  Laterality: N/A;  . PARTIAL COLECTOMY  2009   for obstruction: temporary ostomy, later reversed.   Marland Kitchen RIGHT HEART CATHETERIZATION N/A 09/05/2013   Procedure: RIGHT HEART CATH;  Surgeon: Jolaine Artist, MD;  Location: [facility] CATH LAB;  Service: Cardiovascular;  Laterality: N/A;  . TRANSTHORACIC ECHOCARDIOGRAM  11/12/2011   EF 63-01%, normal systolic function, grade 1 diastolic dysfunction; ventricular septal flattening (D-sign); mild AS; trace-mild MR; LA mildly dilated; RV mod dilated; RA mod dilated; severe pulm HTN; elevated CVP    Family History    Problem Relation Age of Onset  . Schizophrenia Sister   . Alzheimer's disease Mother   . Hyperlipidemia Brother   . Heart disease Sister   . Diabetes Sister   . Cancer Neg Hx   . Stroke Neg Hx   . COPD Neg Hx   . Depression Neg Hx   . Drug abuse Neg Hx   . Early death Neg Hx   . Hypertension Neg Hx   . Kidney disease Neg Hx     Social History:  reports that she has been smoking cigarettes. She has a 26.50 pack-year smoking history. She has never used smokeless tobacco. She reports current alcohol use of about 1.0 standard drink of alcohol per week. She reports that she does not use drugs.  Allergies:  Allergies  Allergen Reactions  . Ceftriaxone Anaphylaxis and Other (See Comments)    *ROCEPHIN*  "Blow up like a balloon"  . Hydroxyzine Shortness Of Breath and Other (See Comments)    Pt states med make her light headed, get sob sxs  . Doxycycline Nausea And Vomiting  . Lexapro [Escitalopram] Other (See Comments)    Pt states med make her dizzy    Medications: I have reviewed the patient's current medications.  Results for orders placed or performed during the hospital encounter of 04/22/20 (from the past 48 hour(s))  Basic metabolic panel     Status: Abnormal   Collection Time: 04/22/20 11:50 AM  Result Value Ref Range   Sodium 141 135 - 145 mmol/L   Potassium 3.5 3.5 - 5.1 mmol/L   Chloride 94 (L) 98 - 111 mmol/L   CO2 34 (H) 22 - 32 mmol/L   Glucose, Bld 123 (H) 70 - 99 mg/dL    Comment: Glucose reference range applies only to samples taken after fasting for at least 8 hours.   BUN 15 8 - 23 mg/dL   Creatinine, Ser 1.21 (H) 0.44 - 1.00 mg/dL   Calcium 10.7 (H) 8.9 - 10.3 mg/dL   GFR calc non Af Amer 44 (L) >60 mL/min   GFR calc Af Amer 51 (L) >60 mL/min   Anion gap 13 5 - 15    Comment: Performed at [facility] [address], [place], [place] [address]  CBC     Status: Abnormal   Collection Time: 04/22/20 11:50 AM  Result Value Ref Range   WBC 11.3 (H)  4.0 - 10.5 K/uL   RBC 4.25 3.87 - 5.11 MIL/uL   Hemoglobin 13.3 12.0 - 15.0 g/dL   HCT 42.5 36 - 46 %   MCV 100.0 80.0 - 100.0 fL   MCH 31.3 26.0 - 34.0 pg   MCHC 31.3 30.0 - 36.0 g/dL   RDW 13.2 11.5 - 15.5 %   Platelets 258 150 - 400 K/uL   nRBC 0.0 0.0 - 0.2 %    Comment: Performed at [facility], [place] [address], [place], [place]  [address]  Troponin I (High Sensitivity)     Status: None   Collection Time: 04/22/20 11:50 AM  Result Value Ref Range   Troponin I (High Sensitivity) 11 <18 ng/L    Comment: (NOTE) Elevated high sensitivity troponin I (hsTnI) values and significant  changes across serial measurements may suggest ACS but many other  chronic and acute conditions are known to elevate hsTnI results.  Refer to the "Links" section for chest pain algorithms and additional  guidance. Performed at [facility], [place] [address] [place], [place] [address]   Troponin I (High Sensitivity)     Status: None   Collection Time: 04/22/20  3:20 PM  Result Value Ref Range   Troponin I (High Sensitivity) 10 <18 ng/L    Comment: (NOTE) Elevated high sensitivity troponin I (hsTnI) values and significant  changes across serial measurements may suggest ACS but many other  chronic and acute conditions are known to elevate hsTnI results.  Refer to the "Links" section for chest pain algorithms and additional  guidance. Performed at [facility], [place] [address] [place], [place] [address]     DG Chest 2 View  Result Date: 04/22/2020 CLINICAL DATA:  Chest pain. EXAM: CHEST - 2 VIEW COMPARISON:  Prior chest radiographs 09/11/2018 and earlier FINDINGS: Unchanged mild cardiomegaly. Aortic atherosclerosis. No appreciable airspace consolidation or pulmonary edema. No sizable pleural effusion or evidence of pneumothorax. No acute bony abnormality identified. Multiple metallic clips project in the region of the right axilla. Additionally, surgical clips are present within the  upper abdomen. IMPRESSION: No evidence of acute cardiopulmonary abnormality. Unchanged mild cardiomegaly. Aortic Atherosclerosis (ICD10-I70.0). Electronically Signed   By: Kellie Simmering DO   On: 04/22/2020 12:09   CT ABDOMEN PELVIS W CONTRAST  Result Date: 04/22/2020 CLINICAL DATA:  74 year old female with history of abdominal pain. Suspected hernia. EXAM: CT ABDOMEN AND PELVIS WITH CONTRAST TECHNIQUE: Multidetector CT imaging of the abdomen and pelvis was performed using the standard protocol following bolus administration of intravenous contrast. CONTRAST:  154mL OMNIPAQUE IOHEXOL 300 MG/ML  SOLN COMPARISON:  CT the abdomen and pelvis 09/04/2015. FINDINGS: Lower chest: Atherosclerotic calcifications in the left anterior descending, left circumflex and right coronary arteries. Severe calcifications of the aortic valve. Mild scarring and pleural thickening in the lung bases bilaterally. Hepatobiliary: No suspicious cystic or solid hepatic lesions. No intra or extrahepatic biliary ductal dilatation. Status post cholecystectomy. Pancreas: No pancreatic mass. No pancreatic ductal dilatation. No pancreatic or peripancreatic fluid collections or inflammatory changes. Spleen: Unremarkable. Adrenals/Urinary Tract: Low-attenuation lesions in both kidneys measuring up to 1.4 cm in the lower pole of the right kidney, compatible with cysts. Small amount of parenchymal thinning in the posterior aspect of the interpolar region of the left kidney, presumably post infectious scarring. No suspicious renal lesions. No hydroureteronephrosis. Bilateral adrenal glands are normal in appearance. Urinary bladder is unremarkable in appearance. Stomach/Bowel: Normal appearance of the stomach. Multiple dilated loops of mid to distal small bowel which measure up to 4.3 cm in diameter in the low anatomic pelvis where there is also some feculent contents in the small bowel, indicative of some small bowel stasis. Segment of mid to distal  small bowel extends into a moderate to large infraumbilical ventral hernia. Postoperative changes of what appears to be partial right hemicolectomy. Some gas and stool is noted throughout the colon and rectum. Vascular/Lymphatic: Aortic atherosclerosis, without evidence of aneurysm or dissection in the abdominal or pelvic vasculature. No lymphadenopathy noted in the  abdomen or pelvis. Reproductive: Uterus and ovaries are unremarkable in appearance. Other: No significant volume of ascites.  No pneumoperitoneum. Musculoskeletal: There are no aggressive appearing lytic or blastic lesions noted in the visualized portions of the skeleton. Chronic appearing compression fractures of L1 and L3 are noted with 25% loss of anterior vertebral body height at both levels, similar to the prior study. IMPRESSION: 1. Findings are compatible with early or partial small bowel obstruction related to a moderate to large infraumbilical ventral hernia. Surgical consultation is suggested. 2. Aortic atherosclerosis, in addition to 3 vessel coronary artery disease. Please note that although the presence of coronary artery calcium documents the presence of coronary artery disease, the severity of this disease and any potential stenosis cannot be assessed on this non-gated CT examination. Assessment for potential risk factor modification, dietary therapy or pharmacologic therapy may be warranted, if clinically indicated. 3. There are calcifications of the aortic valve. Echocardiographic correlation for evaluation of potential valvular dysfunction may be warranted if clinically indicated. 4. Additional incidental findings, as above. Electronically Signed   By: Vinnie Langton M.D.   On: 04/22/2020 19:06    ROS 10 point review of systems is negative except as listed above in HPI.   Physical Exam Blood pressure (!) 115/100, pulse 91, temperature 98 F (36.7 C), temperature source Oral, resp. rate 15, height 5\' 2"  (1.575 m), weight 49.9  kg, SpO2 100 %. Constitutional: well-developed, well-nourished HEENT: pupils equal, round, reactive to light, 45mm b/l, moist conjunctiva, external inspection of ears and nose normal, hearing intact Oropharynx: normal oropharyngeal mucosa, normal dentition Neck: no thyromegaly, trachea midline, no midline cervical tenderness to palpation Chest: breath sounds equal bilaterally, normal respiratory effort, no midline or lateral chest wall tenderness to palpation/deformity Abdomen: soft, NT, 4cm ventral hernia at the inferior aspect of the midline incision, soft and easily reducible GU: normal female genitalia  Back: no wounds, no thoracic/lumbar spine tenderness to palpation, no thoracic/lumbar spine stepoffs Rectal: deferred Extremities: 2+ radial and pedal pulses bilaterally, motor and sensation intact to bilateral UE and LE, no peripheral edema MSK: normal gait/station, no clubbing/cyanosis of fingers/toes, normal ROM of all four extremities Skin: warm, dry, no rashes Psych: normal memory, normal mood/affect    Assessment/Plan: 59F with radiographic suspicion for SBO and ventral hernia.  Most likely has significant constipation resulting in high stool burden and GI dysmotility. Recommend NG tube placement for SB protocol and more aggressive bowel regimen for outpatient setting.    Jesusita Oka, MD [facility]

## 2020-04-22 NOTE — ED Provider Notes (Addendum)
6:54 PM Here with N/V 24 hours Ct/ r/o obstruction/incarcerated hernia  Reviewed the patient's CT scan which shows a small bowel obstruction secondary to loops of bowel in her hernia.  She states that she has had a soft spot in her abdomen for a long time but she threw up and felt a tear in her abdomen.  She noticed after that that it was bulging and she had exquisite tenderness.  Since that time she has been persistently vomiting.  She has manycomobidities including chronic hypoxic respiratory failure and history of CAD with CHF.  I paged Dr. Bobbye Morton on-call for the trauma service however she has been involved in a very complicated and emergent cases been unable to to return the call.  I have given signout to Dr. Francia Greaves who will speak with Dr. Jamas Lav.  Suspect the patient will need admission.   Margarita Mail, PA-C 04/22/20 2214    Margarita Mail, PA-C 04/22/20 2221    Valarie Merino, MD 05/06/20 1050

## 2020-04-22 NOTE — ED Notes (Signed)
Pt actively vomiting pt 4mg  IV Zofran administered, pt requesting four more mg due to taking 8mg  Zofran at home. Primary RN Lil RN made aware as this RN are is giving report.

## 2020-04-22 NOTE — ED Provider Notes (Signed)
[facility] EMERGENCY DEPARTMENT Provider Note   CSN: [id number] Arrival date & time: 04/22/20  1126     History Chief Complaint  Patient presents with  . Chest Pain    [name] is a 74 y.o. female.  HPI  74 year old female with an extensive medical history including CAD with bypass, CHF with an EF of 55 to 60%, COPD on 3 L of oxygen at baseline breast cancer, NSTEMI presents to the ER with complaints of nausea and vomiting that has been consistent since the previous night.  Patient states that she has not been able to tolerate anything by mouth over the last 24 hours.  She complains of dehydration, states she has not been urinating very much but her last bowel movement was yesterday.  She says she has a history of chronic constipation, and takes stool softeners.  She thought her symptoms were GERD, took Protonix and proceeded to vomit the pills up as well.  Triage notes note that the patient had complained of chest pain when EMS arrived, they noted wheezing and gave her albuterol and 125 mg of Solu-Medrol.  They also gave her 324 of aspirin which she also vomited as well.  Here in the ER, she denies any significant chest pain, states that most of her pain is in her abdomen.  She states that she has a history of having an ileostomy, and a subsequent development of a hernia.  She states that she has a significant amount of pain to that area which started with the onset of the nausea and vomiting.  She states she has been vaccinated for Covid with both eyes her vaccines.  She denies any fevers, chills, dysuria, hematuria, hematochezia, hemoptysis.  No headaches.  States she feels a little bit more short of breath, currently on 4 L instead of her 3.  She denies any cough.  Denies any alcohol or drug use.  Past Medical History:  Diagnosis Date  . Abnormal LFTs 08/02/2011  . Acute liver failure 06/19/2013  . Acute renal failure ([place]) 06/19/2013  . Acute respiratory failure  ([place]) 06/19/2013  . Altered mental status 08/01/2011  . Angina   . Anxiety   . Anxiety state, unspecified 12/03/2013  . Aortic stenosis    mild  . Arthritis    "hands" (02/03/2017)  . Breast cancer, right breast ([place]) dx'd 2008  . CAD (coronary artery disease) of artery bypass graft 11/06/2013  . CAD (coronary artery disease), MI R/O 08/01/2011   PCI in 2006 (bare metal stent, unknown artery) - [place]   . CAP (community acquired pneumonia) 09/11/2018  . CHF,  acute diastolic, BNP 4k on admissio 08/01/2011  . Chronic bronchitis ([place])   . Chronic respiratory failure ([place]) 02/02/2012  . Compression fracture of L1 lumbar vertebra ([place]) 02/02/2012  . COPD (chronic obstructive pulmonary disease) (HCC)    oxygen-dependent 4LPM [place]  . Coronary artery disease 2006   2 stents w/previous MI  . Depressed   . Diastolic CHF ([place])   . Dyslipidemia   . Family history of adverse reaction to anesthesia    "daughter had c-section; missed twice w/epidural"  . GERD (gastroesophageal reflux disease)   . Heart murmur   . History of blood transfusion    "w/my colon OR"  . 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 History of nuclear stress test 08/03/2011   attenuation at apex - no perfusion defects   . HTN (hypertension) 11/06/2013  . Hyperkalemia, on ACE prior  to admission 11/11/2011  . Hypertension   . Hyponatremia 01/31/2012  . Migraine    "qod to q couple months since I was 21" (02/03/2017)  . Mild aortic stenosis 08/01/2011   AVA 1.69 cm2 (06/02/2011)   . Moderate to severe pulmonary hypertension ([place])   . NSTEMI (non-ST elevated myocardial infarction) ([place]) 2006  . NSVT (nonsustained ventricular tachycardia) (HCC)    h/o  . On home oxygen therapy    "3L just at night; have it available prn duringtheday" (09/11/2018)  . Pneumonia    "alot of times" (02/03/2017)    Patient Active Problem List   Diagnosis Date Noted  . Small bowel obstruction ([place]) 04/22/2020  . Migraine headache without aura   . Chronic  renal disease, stage 3, moderately decreased glomerular filtration rate (GFR) between 30-59 mL/min/1.73 square meter 03/20/2019  . COPD with acute exacerbation ([place]) 08/24/2017  . Chronic respiratory failure with hypoxia ([place]) 08/24/2017  . Anxiety 08/24/2017  . Insomnia due to anxiety and fear 08/22/2017  . Oxygen dependent 08/16/2017  . Hyperlipidemia LDL goal <100 02/03/2017  . Mixed hyperlipidemia 03/31/2016  . Other emphysema ([place]) 06/14/2014  . Coronary artery disease involving native coronary artery of native heart without angina pectoris 11/06/2013  . HTN (hypertension) 11/06/2013  . Breast cancer of upper-outer quadrant of right female breast ([place]) 08/21/2013  . Osteoporosis 01/01/2013  . Chronic diastolic CHF (congestive heart failure) ([place]) 01/31/2012  . Aortic valve stenosis 08/01/2011    Class: Chronic    Past Surgical History:  Procedure Laterality Date  . BREAST BIOPSY Right 2008  . BREAST LUMPECTOMY Right 2008   malignant  . CATARACT EXTRACTION W/ INTRAOCULAR LENS  IMPLANT, BILATERAL Bilateral ~ 2013  . [place]; 1971; 1978  . CHOLECYSTECTOMY OPEN  2011  . COLON SURGERY    . COLOSTOMY  11/2007  . COLOSTOMY CLOSURE  07/2008  . CORONARY ANGIOPLASTY WITH STENT PLACEMENT  2006   "2 stents"  . ERCP N/A 09/05/2015   Procedure: ENDOSCOPIC RETROGRADE CHOLANGIOPANCREATOGRAPHY (ERCP);  Surgeon: Ladene Artist, MD;  Location: [facility];  Service: Endoscopy;  Laterality: N/A;  . PARTIAL COLECTOMY  2009   for obstruction: temporary ostomy, later reversed.   Marland Kitchen RIGHT HEART CATHETERIZATION N/A 09/05/2013   Procedure: RIGHT HEART CATH;  Surgeon: Jolaine Artist, MD;  Location: [facility] CATH LAB;  Service: Cardiovascular;  Laterality: N/A;  . TRANSTHORACIC ECHOCARDIOGRAM  11/12/2011   EF 38-25%, normal systolic function, grade 1 diastolic dysfunction; ventricular septal flattening (D-sign); mild AS; trace-mild MR; LA mildly dilated; RV mod dilated; RA mod dilated;  severe pulm HTN; elevated CVP     OB History   No obstetric history on file.     Family History  Problem Relation Age of Onset  . Schizophrenia Sister   . Alzheimer's disease Mother   . Hyperlipidemia Brother   . Heart disease Sister   . Diabetes Sister   . Cancer Neg Hx   . Stroke Neg Hx   . COPD Neg Hx   . Depression Neg Hx   . Drug abuse Neg Hx   . Early death Neg Hx   . Hypertension Neg Hx   . Kidney disease Neg Hx     Social History   Tobacco Use  . Smoking status: Current Some Day Smoker    Packs/day: 0.50    Years: 53.00    Pack years: 26.50    Types: Cigarettes  . Smokeless tobacco: Never Used  Vaping  Use  . Vaping Use: Never used  Substance Use Topics  . Alcohol use: Yes    Alcohol/week: 1.0 standard drink    Types: 1 Glasses of wine per week  . Drug use: No    Home Medications Prior to Admission medications   Medication Sig Start Date End Date Taking? Authorizing Provider  ALPRAZolam (XANAX) 1 MG tablet TAKE 1 TABLET BY MOUTH THREE TIMES A DAY AS NEEDED Patient taking differently: Take 1 mg by mouth 3 (three) times daily.  02/13/20  Yes [name], MD  Artificial Tear Ointment (DRY EYES OP) Apply 2 drops to eye daily as needed (dry eyes).    Yes [provider]  aspirin 325 MG EC tablet Take 1 tablet (325 mg total) by mouth every morning. Patient taking differently: Take 325 mg by mouth daily.  09/21/15  Yes Velvet Bathe, MD  ATROVENT HFA 17 MCG/ACT inhaler TAKE 2 PUFFS BY MOUTH EVERY 4 HOURS AS NEEDED FOR WHEEZE Patient taking differently: Inhale 2 puffs into the lungs every 4 (four) hours as needed for wheezing.  03/19/20  Yes [name], MD  butalbital-acetaminophen-caffeine (FIORICET) 50-325-40 MG tablet TAKE 1 TABLET BY MOUTH EVERY 4 (FOUR) HOURS AS NEEDED FOR HEADACHE. 04/15/20  Yes [name], MD  Calcium Carbonate (CALCIUM 600 PO) Take 1 tablet by mouth daily.    Yes [provider]  cholecalciferol (VITAMIN D)  1000 units tablet Take 1,000 Units by mouth daily.   Yes [provider]  diclofenac sodium (VOLTAREN) 1 % GEL Apply 4 g topically as needed (for pain). 01/14/19  Yes [name], MD  furosemide (LASIX) 40 MG tablet Take 1 tablet (40 mg total) by mouth 2 (two) times daily. 04/08/20  Yes [name], MD  ipratropium-albuterol (DUONEB) 0.5-2.5 (3) MG/3ML SOLN Inhale 3 mLs into the lungs 2 (two) times daily as needed (for SOB). 05/01/19  Yes [name], MD  KLOR-CON M20 20 MEQ tablet TAKE 1 TABLET BY MOUTH EVERY DAY Patient taking differently: Take 20 mEq by mouth daily.  04/15/20  Yes [name], MD  lactulose, encephalopathy, (ENULOSE) 10 GM/15ML SOLN TAKE 45 MILLILITERS BY MOUTH 3 TIMES A DAY AS NEEDED FOR CONSTIPATION Patient taking differently: Take 30 g by mouth See admin instructions. TAKE 45 MILLILITERS BY MOUTH 3 TIMES A DAY AS NEEDED FOR CONSTIPATION 04/15/20  Yes [name], MD  levocetirizine (XYZAL) 5 MG tablet Take 1 tablet (5 mg total) by mouth every evening. 05/05/19  Yes [name], MD  montelukast (SINGULAIR) 10 MG tablet TAKE 1 TABLET BY MOUTH EVERYDAY AT BEDTIME Patient taking differently: Take 10 mg by mouth at bedtime.  10/12/19  Yes [name], MD  nortriptyline (PAMELOR) 25 MG capsule TAKE 2 CAPSULES BY MOUTH AT BEDTIME Patient taking differently: Take 25 mg by mouth at bedtime.  04/15/20  Yes [name], MD  Omega 3 1200 MG CAPS Take 1,200 mg by mouth every morning.    Yes [provider]  ondansetron (ZOFRAN) 8 MG tablet TAKE 1 TABLET BY MOUTH EVERY 8 HOURS AS NEEDED FOR NAUSEA Patient taking differently: Take 8 mg by mouth every 8 (eight) hours as needed for nausea.  11/24/18  Yes [name], MD  OXYGEN-HELIUM IN Inhale 3 L into the lungs See admin instructions. Uses when needed during the day, uses continuous throughout the night   Yes [provider]  pantoprazole (PROTONIX) 40 MG tablet TAKE 1  TABLET BY MOUTH EVERY  DAY Patient taking differently: Take 40 mg by mouth daily.  03/06/20  Yes [name], MD  [name] 60 MG/ML SOSY injection TO BE ADMINISTERED IN PHYSICIAN'S OFFICE. INJECT ONE SYRINGE SUBCOUSLY ONCE EVERY 6 MONTHS. REFRIGERATE. USE WITHIN 14 DAYS ONCE AT ROOM TEMPERATURE. Patient taking differently: Inject 60 mg into the skin every 6 (six) months.  08/29/19  Yes [name], MD  VENTOLIN HFA 108 (90 Base) MCG/ACT inhaler INHALE 2 PUFFS BY MOUTH EVERY 4 HOURS AS NEEDED Patient taking differently: Inhale 2 puffs into the lungs every 4 (four) hours as needed for wheezing or shortness of breath.  02/27/20  Yes [name], MD  vitamin C (ASCORBIC ACID) 500 MG tablet Take 500 mg by mouth daily.   Yes [provider]  fluticasone (FLONASE) 50 MCG/ACT nasal spray USE 2 SPRAYS INTO EACH NOSTRIL ONCE DAILY**REPEAT FOR 5 DAYS THEN STOP Patient not taking: Reported on 04/22/2020 04/18/16   [name], MD  LIVALO 2 MG TABS TAKE 1 TABLET BY MOUTH EVERY DAY Patient taking differently: Take 2 mg by mouth daily.  11/16/19   [name], MD  SYMBICORT 160-4.5 MCG/ACT inhaler TAKE 2 PUFFS BY MOUTH TWICE A DAY IN TO THE LUNGS Patient taking differently: Inhale 2 puffs into the lungs in the morning and at bedtime.  03/19/20   [name], MD  KLOR-CON M20 20 MEQ tablet TAKE 1 TABLET BY MOUTH EVERY DAY 06/24/19   [name], MD  LIVALO 2 MG TABS TAKE 1 TABLET BY MOUTH EVERY DAY 06/24/19   [name], MD  nortriptyline (PAMELOR) 25 MG capsule TAKE 2 CAPSULES AT BEDTIME 06/24/19   [name], MD    Allergies    Ceftriaxone, Hydroxyzine, Doxycycline, and Lexapro [escitalopram]  Review of Systems   Review of Systems  Constitutional: Negative for chills and fever.  HENT: Negative for ear pain and sore throat.   Eyes: Negative for pain and visual disturbance.  Respiratory: Positive for shortness of breath. Negative for cough.   Cardiovascular: Negative for chest pain and  palpitations.  Gastrointestinal: Positive for abdominal pain, constipation, nausea and vomiting.  Genitourinary: Negative for dysuria and hematuria.  Musculoskeletal: Negative for arthralgias and back pain.  Skin: Negative for color change and rash.  Neurological: Negative for dizziness, seizures, syncope and headaches.  All other systems reviewed and are negative.   Physical Exam Updated Vital Signs BP (!) 160/73   Pulse 76   Temp 98 F (36.7 C) (Oral)   Resp 22   Ht 5\' 2"  (1.575 m)   Wt 49.9 kg   SpO2 99%   BMI 20.12 kg/m   Physical Exam Vitals and nursing note reviewed.  Constitutional:      General: She is not in acute distress.    Appearance: She is well-developed.  HENT:     Head: Normocephalic and atraumatic.  Eyes:     Conjunctiva/sclera: Conjunctivae normal.  Cardiovascular:     Rate and Rhythm: Normal rate and regular rhythm.     Heart sounds: No murmur heard.   Pulmonary:     Effort: Pulmonary effort is normal. No respiratory distress.     Breath sounds: Normal breath sounds.  Abdominal:     General: Abdomen is protuberant.     Palpations: Abdomen is soft.     Tenderness: There is no abdominal tenderness. There is guarding. There is no right CVA tenderness or left CVA tenderness.  Negative signs include Murphy's sign and McBurney's sign.     Hernia: A hernia is present. Hernia is present in the ventral area.       Comments: Large ventral hernia above the umbilicus.  Tender to palpation.  I was able to reduce the left side, however the right was nonreducible.  Global tenderness to palpation.  Musculoskeletal:     Cervical back: Neck supple.  Skin:    General: Skin is warm and dry.  Neurological:     Mental Status: She is alert.     ED Results / Procedures / Treatments   Labs (all labs ordered are listed, but only abnormal results are displayed) Labs Reviewed  BASIC METABOLIC PANEL - Abnormal; Notable for the following components:      Result Value     Chloride 94 (*)    CO2 34 (*)    Glucose, Bld 123 (*)    Creatinine, Ser 1.21 (*)    Calcium 10.7 (*)    GFR calc non Af Amer 44 (*)    GFR calc Af Amer 51 (*)    All other components within normal limits  CBC - Abnormal; Notable for the following components:   WBC 11.3 (*)    All other components within normal limits  BASIC METABOLIC PANEL - Abnormal; Notable for the following components:   Glucose, Bld 109 (*)    Creatinine, Ser 1.09 (*)    GFR calc non Af Amer 50 (*)    GFR calc Af Amer 58 (*)    All other components within normal limits  CBC - Abnormal; Notable for the following components:   MCV 101.0 (*)    All other components within normal limits  SARS CORONAVIRUS 2 BY RT PCR (DIASORIN)  EXPECTORATED SPUTUM ASSESSMENT W REFEX TO RESP CULTURE  TROPONIN I (HIGH SENSITIVITY)  TROPONIN I (HIGH SENSITIVITY)    EKG EKG Interpretation  Date/Time:  Tuesday April 22 2020 21:25:10 EDT Ventricular Rate:  78 PR Interval:    QRS Duration: 91 QT Interval:  392 QTC Calculation: 447 R Axis:   33 Text Interpretation: Sinus rhythm Atrial premature complexes Abnormal R-wave progression, early transition Confirmed by Dene Gentry [phone]) on 04/22/2020 9:33:08 PM Also confirmed by Dene Gentry [phone]), editor [place], [place] [phone])  on 04/23/2020 7:46:45 AM   Radiology DG Chest 2 View  Result Date: 04/22/2020 CLINICAL DATA:  Chest pain. EXAM: CHEST - 2 VIEW COMPARISON:  Prior chest radiographs 09/11/2018 and earlier FINDINGS: Unchanged mild cardiomegaly. Aortic atherosclerosis. No appreciable airspace consolidation or pulmonary edema. No sizable pleural effusion or evidence of pneumothorax. No acute bony abnormality identified. Multiple metallic clips project in the region of the right axilla. Additionally, surgical clips are present within the upper abdomen. IMPRESSION: No evidence of acute cardiopulmonary abnormality. Unchanged mild cardiomegaly. Aortic Atherosclerosis  (ICD10-I70.0). Electronically Signed   By: Kellie Simmering DO   On: 04/22/2020 12:09   CT ABDOMEN PELVIS W CONTRAST  Result Date: 04/22/2020 CLINICAL DATA:  74 year old female with history of abdominal pain. Suspected hernia. EXAM: CT ABDOMEN AND PELVIS WITH CONTRAST TECHNIQUE: Multidetector CT imaging of the abdomen and pelvis was performed using the standard protocol following bolus administration of intravenous contrast. CONTRAST:  153mL OMNIPAQUE IOHEXOL 300 MG/ML  SOLN COMPARISON:  CT the abdomen and pelvis 09/04/2015. FINDINGS: Lower chest: Atherosclerotic calcifications in the left anterior descending, left circumflex and right coronary arteries. Severe calcifications of the aortic valve. Mild scarring and pleural thickening in the lung bases bilaterally.  Hepatobiliary: No suspicious cystic or solid hepatic lesions. No intra or extrahepatic biliary ductal dilatation. Status post cholecystectomy. Pancreas: No pancreatic mass. No pancreatic ductal dilatation. No pancreatic or peripancreatic fluid collections or inflammatory changes. Spleen: Unremarkable. Adrenals/Urinary Tract: Low-attenuation lesions in both kidneys measuring up to 1.4 cm in the lower pole of the right kidney, compatible with cysts. Small amount of parenchymal thinning in the posterior aspect of the interpolar region of the left kidney, presumably post infectious scarring. No suspicious renal lesions. No hydroureteronephrosis. Bilateral adrenal glands are normal in appearance. Urinary bladder is unremarkable in appearance. Stomach/Bowel: Normal appearance of the stomach. Multiple dilated loops of mid to distal small bowel which measure up to 4.3 cm in diameter in the low anatomic pelvis where there is also some feculent contents in the small bowel, indicative of some small bowel stasis. Segment of mid to distal small bowel extends into a moderate to large infraumbilical ventral hernia. Postoperative changes of what appears to be partial right  hemicolectomy. Some gas and stool is noted throughout the colon and rectum. Vascular/Lymphatic: Aortic atherosclerosis, without evidence of aneurysm or dissection in the abdominal or pelvic vasculature. No lymphadenopathy noted in the abdomen or pelvis. Reproductive: Uterus and ovaries are unremarkable in appearance. Other: No significant volume of ascites.  No pneumoperitoneum. Musculoskeletal: There are no aggressive appearing lytic or blastic lesions noted in the visualized portions of the skeleton. Chronic appearing compression fractures of L1 and L3 are noted with 25% loss of anterior vertebral body height at both levels, similar to the prior study. IMPRESSION: 1. Findings are compatible with early or partial small bowel obstruction related to a moderate to large infraumbilical ventral hernia. Surgical consultation is suggested. 2. Aortic atherosclerosis, in addition to 3 vessel coronary artery disease. Please note that although the presence of coronary artery calcium documents the presence of coronary artery disease, the severity of this disease and any potential stenosis cannot be assessed on this non-gated CT examination. Assessment for potential risk factor modification, dietary therapy or pharmacologic therapy may be warranted, if clinically indicated. 3. There are calcifications of the aortic valve. Echocardiographic correlation for evaluation of potential valvular dysfunction may be warranted if clinically indicated. 4. Additional incidental findings, as above. Electronically Signed   By: Vinnie Langton M.D.   On: 04/22/2020 19:06   DG Abd Portable 1V-Small Bowel Protocol-Position Verification  Result Date: 04/23/2020 CLINICAL DATA:  NG tube placement EXAM: PORTABLE ABDOMEN - 1 VIEW COMPARISON:  CT 04/22/2020 FINDINGS: Interval placement of nasogastric tube which courses below the diaphragm and distal tip projecting near the midline of the mid abdomen likely at the junction of the second and third  portions of the duodenum. No dilated loops of small bowel are appreciated. Moderate volume of stool throughout the colon. Excreted contrast within the urinary bladder. Mild wedge deformities of L1 and L3 as seen on prior CT. IMPRESSION: Interval placement of nasogastric tube with distal tip projecting near the midline of the mid abdomen likely at the junction of the second and third portions of the duodenum. Electronically Signed   By: Davina Poke D.O.   On: 04/23/2020 08:08    Procedures Procedures (including critical care time)  Medications Ordered in ED Medications  ondansetron (ZOFRAN) injection 4 mg (4 mg Intravenous Not Given 04/22/20 2327)  iohexol (OMNIPAQUE) 300 MG/ML solution 100 mL (has no administration in time range)  morphine 2 MG/ML injection 1-3 mg (has no administration in time range)  ondansetron (ZOFRAN) injection 4 mg (4  mg Intravenous Given 04/22/20 2314)  mometasone-formoterol (DULERA) 200-5 MCG/ACT inhaler 2 puff (2 puffs Inhalation Not Given 04/23/20 1115)  albuterol (VENTOLIN HFA) 108 (90 Base) MCG/ACT inhaler 2 puff (has no administration in time range)  diclofenac sodium (VOLTAREN) 1 % transdermal gel 4 g (has no administration in time range)  LORazepam (ATIVAN) injection 0.5 mg (0.5 mg Intravenous Given 04/23/20 1012)  methylPREDNISolone sodium succinate (SOLU-MEDROL) 40 mg/mL injection 40 mg (40 mg Intravenous Given 04/23/20 0637)  sodium chloride flush (NS) 0.9 % injection 3 mL (3 mLs Intravenous Given 04/23/20 0107)  sodium chloride flush (NS) 0.9 % injection 3 mL (has no administration in time range)  0.9 %  sodium chloride infusion (has no administration in time range)  promethazine (PHENERGAN) injection 12.5 mg (12.5 mg Intravenous Given 04/23/20 0107)  0.45 % NaCl with KCl 20 mEq / L infusion (has no administration in time range)  azithromycin (ZITHROMAX) 500 mg in sodium chloride 0.9 % 250 mL IVPB (0 mg Intravenous Stopped 04/23/20 0308)  ipratropium-albuterol  (DUONEB) 0.5-2.5 (3) MG/3ML nebulizer solution 3 mL (has no administration in time range)  sodium chloride flush (NS) 0.9 % injection 3 mL (3 mLs Intravenous Given 04/22/20 1549)  albuterol (VENTOLIN HFA) 108 (90 Base) MCG/ACT inhaler 6 puff (6 puffs Inhalation Given 04/22/20 1610)  fentaNYL (SUBLIMAZE) injection 25 mcg (25 mcg Intravenous Given 04/22/20 1610)  sodium chloride 0.9 % bolus 500 mL (500 mLs Intravenous New Bag/Given 04/23/20 0105)  iohexol (OMNIPAQUE) 300 MG/ML solution 100 mL (100 mLs Intravenous Contrast Given 04/22/20 1722)  morphine 2 MG/ML injection 2 mg (2 mg Intravenous Given 04/22/20 2311)  diatrizoate meglumine-sodium (GASTROGRAFIN) 66-10 % solution 90 mL (90 mLs Per NG tube Given 04/23/20 0851)    ED Course  I have reviewed the triage vital signs and the nursing notes.  Pertinent labs & imaging results that were available during my care of the patient were reviewed by me and considered in my medical decision making (see chart for details).    MDM Rules/Calculators/A&P                         74 year old female with intractable nausea and vomiting over the last 24 hours. On presentation, the patient is alert, oriented, overall well-appearing, nondiaphoretic, speaking in full sentences, not actively vomiting.  Blood pressure on initial presentation was soft with a blood pressure of 101/63, however this improved throughout her ED course.  Patient received 500 cc of fluid, given her history of CHF.  Physical exam with very large ventral hernia bilaterally.  Left side was able to reduce, however the right side was nonreducible.  Tender to palpation.  No direct evidence of gangrene.  CBC without leukocytosis, BMP without any significant electrolyte abnormalities, slightly increased creatinine of 1.09 likely in the setting of dehydration with decreased GFR.  Covid negative.  Chest x-ray without any acute changes.  EKG normal sinus rhythm with PACs.  DDx includes SBO, incarcerated hernia,  diverticulitis,viral GI, COVID.  CT of the abdomen pending.  Signed out care to Margarita Mail, PA-C who will oversee her CT reading and dispo accordingly.  Suspect the patient will need admission given her extensive medical history and inability to hold down fluids.  Suspect admission for fluid rehydration.  Final Clinical Impression(s) / ED Diagnoses Final diagnoses:  Encounter for imaging study to confirm nasogastric (NG) tube placement  Small bowel obstruction ([place])    Rx / DC Orders ED Discharge Orders  None       Lyndel Safe 04/23/20 1340    Davonna Belling, MD 04/23/20 1454

## 2020-04-22 NOTE — ED Triage Notes (Signed)
Pt arrives via [facility] from home with CP and n/v that started yesterday as well as some sob. Ems noted some wheezing and gave albuterol and 125 mg soul medrol . Pt did take 324 asa but vomited afterwards so unclear if she kept to down.   20G IV Lfa

## 2020-04-23 ENCOUNTER — Inpatient Hospital Stay (HOSPITAL_COMMUNITY): Payer: Medicare Other

## 2020-04-23 ENCOUNTER — Encounter (HOSPITAL_COMMUNITY): Payer: Self-pay | Admitting: Family Medicine

## 2020-04-23 DIAGNOSIS — K56609 Unspecified intestinal obstruction, unspecified as to partial versus complete obstruction: Secondary | ICD-10-CM

## 2020-04-23 DIAGNOSIS — I5032 Chronic diastolic (congestive) heart failure: Secondary | ICD-10-CM | POA: Diagnosis not present

## 2020-04-23 DIAGNOSIS — N1831 Chronic kidney disease, stage 3a: Secondary | ICD-10-CM | POA: Diagnosis not present

## 2020-04-23 DIAGNOSIS — F419 Anxiety disorder, unspecified: Secondary | ICD-10-CM

## 2020-04-23 HISTORY — DX: Unspecified intestinal obstruction, unspecified as to partial versus complete obstruction: K56.609

## 2020-04-23 LAB — CBC
HCT: 41.5 % (ref 36.0–46.0)
Hemoglobin: 12.7 g/dL (ref 12.0–15.0)
MCH: 30.9 pg (ref 26.0–34.0)
MCHC: 30.6 g/dL (ref 30.0–36.0)
MCV: 101 fL — ABNORMAL HIGH (ref 80.0–100.0)
Platelets: 153 10*3/uL (ref 150–400)
RBC: 4.11 MIL/uL (ref 3.87–5.11)
RDW: 13.1 % (ref 11.5–15.5)
WBC: 9 10*3/uL (ref 4.0–10.5)
nRBC: 0 % (ref 0.0–0.2)

## 2020-04-23 LAB — BASIC METABOLIC PANEL
Anion gap: 10 (ref 5–15)
BUN: 12 mg/dL (ref 8–23)
CO2: 32 mmol/L (ref 22–32)
Calcium: 9.9 mg/dL (ref 8.9–10.3)
Chloride: 99 mmol/L (ref 98–111)
Creatinine, Ser: 1.09 mg/dL — ABNORMAL HIGH (ref 0.44–1.00)
GFR calc Af Amer: 58 mL/min — ABNORMAL LOW (ref >60)
GFR calc non Af Amer: 50 mL/min — ABNORMAL LOW (ref >60)
Glucose, Bld: 109 mg/dL — ABNORMAL HIGH (ref 70–99)
Potassium: 4.2 mmol/L (ref 3.5–5.1)
Sodium: 141 mmol/L (ref 135–145)

## 2020-04-23 LAB — SARS CORONAVIRUS 2 BY RT PCR (DIASORIN): SARS Coronavirus 2: NEGATIVE

## 2020-04-23 MED ORDER — SODIUM CHLORIDE 0.9 % IV SOLN
500.0000 mg | INTRAVENOUS | Status: DC
  Administered 2020-04-23 – 2020-04-25 (×3): 500 mg via INTRAVENOUS
  Filled 2020-04-23 (×4): qty 500

## 2020-04-23 MED ORDER — POTASSIUM CHLORIDE IN NACL 20-0.45 MEQ/L-% IV SOLN
INTRAVENOUS | Status: AC
  Filled 2020-04-23 (×2): qty 1000

## 2020-04-23 MED ORDER — IPRATROPIUM-ALBUTEROL 0.5-2.5 (3) MG/3ML IN SOLN: 3.0000 mL | RESPIRATORY_TRACT | Status: DC | PRN

## 2020-04-23 NOTE — Progress Notes (Signed)
PROGRESS NOTE  [name] UVO:[id number] DOB: Jan 01, 1946 DOA: 04/22/2020 PCP: [name], MD   LOS: 1 day   Brief narrative: As per HPI,  [name] is a 74 y.o. female with medical history significant for COPD, chronic respiratory failure on 3 L/min supplemental oxygen , anxiety, chronic diastolic CHF, CAD, and history of partial colectomy, presented to the emergency department with 1 day of abdominal pain, back pain, chest discomfort, nausea, and vomiting.  Patient reported developing severe pain yesterday with nausea and nonbloody vomiting continued to worsen overnight and into today. With symptoms continuing to worsen, she called EMS. She was found to be dyspneic and wheezing with EMS and was treated with albuterol and 125 mg IV Solu-Medrol prior to arrival in ED. ED Course: Upon arrival to the ED, patient was found to be afebrile, saturating upper 90s on 4 L/min supplemental oxygen, and with stable blood pressure.  EKG features sinus rhythm with PACs.  Chest x-ray negative for acute cardiopulmonary disease.  CT is concerning for early or partial SBO related to large ventral hernia.  Chemistry panel notable for creatinine 1.21.  CBC with mild leukocytosis.  High-sensitivity troponin is normal x2.  Surgery was consulted by the ED physician and the patient was treated with saline bolus, morphine, fentanyl, Zofran, and albuterol.  Patient was then admitted to the hospital.  Assessment/Plan:  Principal Problem:   Small bowel obstruction (HCC) Active Problems:   Chronic diastolic CHF (congestive heart failure) (HCC)   Coronary artery disease involving native coronary artery of native heart without angina pectoris   COPD with acute exacerbation (HCC)   Chronic respiratory failure with hypoxia (HCC)   Anxiety   Chronic renal disease, stage 3, moderately decreased glomerular filtration rate (GFR) between 30-59 mL/min/1.73 square meter   Partial small bowel obstruction. Surgery on  board.  SBO protocol today.  Conservative management.  Continue to monitor closely.  History of chronic constipation and manual disimpaction at home.. Continue NG tube n.p.o. IV fluids.   Large ventral hernia  No signs of incarceration.  COPD with acute exacerbation; chronic hypoxic respiratory failure  Patient has increased cough and wheezing. CXR  negative infiltrate.  Continue IV steroids azithromycin inhalers and supplemental oxygen.  Chronic diastolic CHF   Appears compensated.  Continue gentle IVF hydration while NPO, monitor daily wt and I/Os .  Watch for fluid overload.   History of CAD Mild chest/abdominal discomfort in the ED.  Normal troponins.  Secondary to SBO.  CKD IIIa   SCr is 1.21 on admission .  Creatinine of 1.0 today.  We will continue to monitor closely.  DVT prophylaxis: SCDs Start: 04/22/20 2322   Code Status: Full code  Family Communication: Spoke with the family at bedside.  Status is: Inpatient  Remains inpatient appropriate because:Unsafe d/c plan, IV treatments appropriate due to intensity of illness or inability to take PO and Inpatient level of care appropriate due to severity of illness   Dispo: The patient is from: Home              Anticipated d/c is to: Home              Anticipated d/c date is: 2 days              Patient currently is not medically stable to d/c. Consultants:  General surgery  Procedures:  NG tube placement  Antibiotics:  . Azithromycin  Anti-infectives (From admission, onward)   Start  Dose/Rate Route Frequency Ordered Stop   04/23/20 0015  azithromycin (ZITHROMAX) 500 mg in sodium chloride 0.9 % 250 mL IVPB     Discontinue     500 mg 250 mL/hr over 60 Minutes Intravenous Every 24 hours 04/23/20 0008 04/28/20 0014       Subjective: Today, patient was seen and examined at bedside.  He did pass some gas but no bowel movement.  No nausea vomiting at this morning.  Objective: Vitals:   04/23/20 0418  04/23/20 0419  BP: (!) 139/53   Pulse:  78  Resp:    Temp:    SpO2:  98%   No intake or output data in the 24 hours ending 04/23/20 0914 Filed Weights   04/22/20 1135  Weight: 49.9 kg   Body mass index is 20.12 kg/m.   Physical Exam: GENERAL: Patient is alert awake and oriented. Not in obvious distress.  On nasal cannula oxygen.  NG tube in place. HENT: No scleral pallor or icterus. Pupils equally reactive to light. Oral mucosa is moist NECK: is supple, no gross swelling noted. CHEST:   Diminished breath sounds bilaterally.  No overt wheezes noted. CVS: S1 and S2 heard, no murmur. Regular rate and rhythm.  ABDOMEN: Soft, distended abdomen with ventral hernia which is reducible, EXTREMITIES: No edema. CNS: Cranial nerves are intact. No focal motor deficits. SKIN: warm and dry without rashes.  Data Review: I have personally reviewed the following laboratory data and studies,  CBC: Recent Labs  Lab 04/22/20 1150 04/23/20 0500  WBC 11.3* 9.0  HGB 13.3 12.7  HCT 42.5 41.5  MCV 100.0 101.0*  PLT 258 563   Basic Metabolic Panel: Recent Labs  Lab 04/22/20 1150 04/23/20 0500  NA 141 141  K 3.5 4.2  CL 94* 99  CO2 34* 32  GLUCOSE 123* 109*  BUN 15 12  CREATININE 1.21* 1.09*  CALCIUM 10.7* 9.9   Liver Function Tests: No results for input(s): AST, ALT, ALKPHOS, BILITOT, PROT, ALBUMIN in the last 168 hours. No results for input(s): LIPASE, AMYLASE in the last 168 hours. No results for input(s): AMMONIA in the last 168 hours. Cardiac Enzymes: No results for input(s): CKTOTAL, CKMB, CKMBINDEX, TROPONINI in the last 168 hours. BNP (last 3 results) No results for input(s): BNP in the last 8760 hours.  ProBNP (last 3 results) No results for input(s): PROBNP in the last 8760 hours.  CBG: No results for input(s): GLUCAP in the last 168 hours. Recent Results (from the past 240 hour(s))  SARS Coronavirus 2 by RT PCR     Status: None   Collection Time: 04/22/20  9:26 PM    Result Value Ref Range Status   SARS Coronavirus 2 NEGATIVE NEGATIVE Final    Comment: (NOTE) Result indicates the ABSENCE of SARS-CoV-2 RNA in the patient specimen.   The lowest concentration of SARS-CoV-2 viral copies this assay can detect in nasopharyngeal swab specimens is 500 copies / mL.  A negative result does not preclude SARS-CoV-2 infection and should not be used as the sole basis for patient management decisions. A negative result may occur with improper specimen collection / handling, submission of a specimen other than nasopharyngeal swab, presence of viral mutation(s) within the areas targeted by this assay, and inadequate number of viral copies (<500 copies / mL) present.  Negative results must be combined with clinical observations, patient history, and epidemiological information.   The expected result is NEGATIVE.   Patient Fact Sheet:  BlogSelections.co.uk  Provider Fact Sheet:  https://lucas.com/    This test is not yet approved or cleared by the [place] and  has been [place] orized for detection and/or diagnosis of SARS-CoV-2 by FDA under an Emergency Use Authorization (EUA).  This EUA will remain in effect (meaning this test can be used) for the duration of  the COVID-19 declaration under Section 564(b)(1) of the Act, 21 U.S.C. section 360bbb-3(b)(1), unless the authorization is terminated or revoked sooner  Performed at [facility], [place] [address] [place],  [address]      Studies: DG Chest 2 View  Result Date: 04/22/2020 CLINICAL DATA:  Chest pain. EXAM: CHEST - 2 VIEW COMPARISON:  Prior chest radiographs 09/11/2018 and earlier FINDINGS: Unchanged mild cardiomegaly. Aortic atherosclerosis. No appreciable airspace consolidation or pulmonary edema. No sizable pleural effusion or evidence of pneumothorax. No acute bony abnormality identified. Multiple metallic clips project in the region  of the right axilla. Additionally, surgical clips are present within the upper abdomen. IMPRESSION: No evidence of acute cardiopulmonary abnormality. Unchanged mild cardiomegaly. Aortic Atherosclerosis (ICD10-I70.0). Electronically Signed   By: Kellie Simmering DO   On: 04/22/2020 12:09   CT ABDOMEN PELVIS W CONTRAST  Result Date: 04/22/2020 CLINICAL DATA:  74 year old female with history of abdominal pain. Suspected hernia. EXAM: CT ABDOMEN AND PELVIS WITH CONTRAST TECHNIQUE: Multidetector CT imaging of the abdomen and pelvis was performed using the standard protocol following bolus administration of intravenous contrast. CONTRAST:  129mL OMNIPAQUE IOHEXOL 300 MG/ML  SOLN COMPARISON:  CT the abdomen and pelvis 09/04/2015. FINDINGS: Lower chest: Atherosclerotic calcifications in the left anterior descending, left circumflex and right coronary arteries. Severe calcifications of the aortic valve. Mild scarring and pleural thickening in the lung bases bilaterally. Hepatobiliary: No suspicious cystic or solid hepatic lesions. No intra or extrahepatic biliary ductal dilatation. Status post cholecystectomy. Pancreas: No pancreatic mass. No pancreatic ductal dilatation. No pancreatic or peripancreatic fluid collections or inflammatory changes. Spleen: Unremarkable. Adrenals/Urinary Tract: Low-attenuation lesions in both kidneys measuring up to 1.4 cm in the lower pole of the right kidney, compatible with cysts. Small amount of parenchymal thinning in the posterior aspect of the interpolar region of the left kidney, presumably post infectious scarring. No suspicious renal lesions. No hydroureteronephrosis. Bilateral adrenal glands are normal in appearance. Urinary bladder is unremarkable in appearance. Stomach/Bowel: Normal appearance of the stomach. Multiple dilated loops of mid to distal small bowel which measure up to 4.3 cm in diameter in the low anatomic pelvis where there is also some feculent contents in the small  bowel, indicative of some small bowel stasis. Segment of mid to distal small bowel extends into a moderate to large infraumbilical ventral hernia. Postoperative changes of what appears to be partial right hemicolectomy. Some gas and stool is noted throughout the colon and rectum. Vascular/Lymphatic: Aortic atherosclerosis, without evidence of aneurysm or dissection in the abdominal or pelvic vasculature. No lymphadenopathy noted in the abdomen or pelvis. Reproductive: Uterus and ovaries are unremarkable in appearance. Other: No significant volume of ascites.  No pneumoperitoneum. Musculoskeletal: There are no aggressive appearing lytic or blastic lesions noted in the visualized portions of the skeleton. Chronic appearing compression fractures of L1 and L3 are noted with 25% loss of anterior vertebral body height at both levels, similar to the prior study. IMPRESSION: 1. Findings are compatible with early or partial small bowel obstruction related to a moderate to large infraumbilical ventral hernia. Surgical consultation is suggested. 2. Aortic atherosclerosis, in addition to 3 vessel  coronary artery disease. Please note that although the presence of coronary artery calcium documents the presence of coronary artery disease, the severity of this disease and any potential stenosis cannot be assessed on this non-gated CT examination. Assessment for potential risk factor modification, dietary therapy or pharmacologic therapy may be warranted, if clinically indicated. 3. There are calcifications of the aortic valve. Echocardiographic correlation for evaluation of potential valvular dysfunction may be warranted if clinically indicated. 4. Additional incidental findings, as above. Electronically Signed   By: Vinnie Langton M.D.   On: 04/22/2020 19:06   DG Abd Portable 1V-Small Bowel Protocol-Position Verification  Result Date: 04/23/2020 CLINICAL DATA:  NG tube placement EXAM: PORTABLE ABDOMEN - 1 VIEW COMPARISON:   CT 04/22/2020 FINDINGS: Interval placement of nasogastric tube which courses below the diaphragm and distal tip projecting near the midline of the mid abdomen likely at the junction of the second and third portions of the duodenum. No dilated loops of small bowel are appreciated. Moderate volume of stool throughout the colon. Excreted contrast within the urinary bladder. Mild wedge deformities of L1 and L3 as seen on prior CT. IMPRESSION: Interval placement of nasogastric tube with distal tip projecting near the midline of the mid abdomen likely at the junction of the second and third portions of the duodenum. Electronically Signed   By: Davina Poke D.O.   On: 04/23/2020 08:08      Flora Lipps, MD  [facility] 04/23/2020

## 2020-04-23 NOTE — Progress Notes (Signed)
NEW ADMISSION NOTE New Admission Note:   Arrival Method:  Patient arrived from ED on stretcher. Mental Orientation:  Alert and oriented x 4. Telemetry: N/A Assessment: Completed Skin: warm, dry and intact, ecchymosis noted on the left arm and left ankle. IV:  R AC SL Pain: Denies any pain. Tubes: N/A Safety Measures: Safety Fall Prevention Plan has been given, discussed and signed Admission: Completed [facility] Orientation: Patient has been orientated to the room, unit and staff.   Orders have been reviewed and implemented. Will continue to monitor the patient. Call light has been placed within reach and bed alarm has been activated.

## 2020-04-23 NOTE — Progress Notes (Signed)
[facility] Progress Note     Subjective: CC:  Denies pain. Reports flatus. Last BM was Monday (7/26) confirms history of chronic constipation requiring stool softeners, lactulose, occasional laxatives, and manual disimpaction at home.   Objective: Vital signs in last 24 hours: Temp:  [98 F (36.7 C)] 98 F (36.7 C) (07/27 1135) Pulse Rate:  [75-91] 78 (07/28 0419) Resp:  [12-19] 16 (07/28 0125) BP: (96-140)/(53-105) 139/53 (07/28 0418) SpO2:  [93 %-100 %] 98 % (07/28 0419) Weight:  [49.9 kg] 49.9 kg (07/27 1135)    Intake/Output from previous day: No intake/output data recorded. Intake/Output this shift: No intake/output data recorded.  PE: Gen:  Alert, NAD, pleasant Card:  Regular rate and rhythm, pedal pulses 2+ BL Pulm:  Normal effort on 3L [place], CTAB Abd: Soft, non-tender, mild distention, lower ventral hernia is soft and reducible  NG flushed and placed to suction. ~500 cc bilious fluid in cannister. Skin: warm and dry, no rashes  Psych: A&Ox3   Lab Results:  Recent Labs    04/22/20 1150 04/23/20 0500  WBC 11.3* 9.0  HGB 13.3 12.7  HCT 42.5 41.5  PLT 258 153   BMET Recent Labs    04/22/20 1150 04/23/20 0500  NA 141 141  K 3.5 4.2  CL 94* 99  CO2 34* 32  GLUCOSE 123* 109*  BUN 15 12  CREATININE 1.21* 1.09*  CALCIUM 10.7* 9.9   PT/INR No results for input(s): LABPROT, INR in the last 72 hours. CMP     Component Value Date/Time   NA 141 04/23/2020 0500   NA 140 06/17/2014 1330   K 4.2 04/23/2020 0500   K 4.3 06/17/2014 1330   CL 99 04/23/2020 0500   CO2 32 04/23/2020 0500   CO2 31 (H) 06/17/2014 1330   GLUCOSE 109 (H) 04/23/2020 0500   GLUCOSE 92 06/17/2014 1330   BUN 12 04/23/2020 0500   BUN 12.8 06/17/2014 1330   CREATININE 1.09 (H) 04/23/2020 0500   CREATININE 1.1 06/17/2014 1330   CALCIUM 9.9 04/23/2020 0500   CALCIUM 10.7 (H) 06/17/2014 1330   PROT 7.5 03/19/2019 1402   PROT 7.9 06/17/2014 1330   ALBUMIN 4.1 03/19/2019  1402   ALBUMIN 3.6 06/17/2014 1330   AST 28 03/19/2019 1402   AST 22 06/17/2014 1330   ALT 18 03/19/2019 1402   ALT 18 06/17/2014 1330   ALKPHOS 54 03/19/2019 1402   ALKPHOS 65 06/17/2014 1330   BILITOT 0.4 03/19/2019 1402   BILITOT 0.34 06/17/2014 1330   GFRNONAA 50 (L) 04/23/2020 0500   GFRAA 58 (L) 04/23/2020 0500   Lipase     Component Value Date/Time   LIPASE 28 09/07/2015 0510       Studies/Results: DG Chest 2 View  Result Date: 04/22/2020 CLINICAL DATA:  Chest pain. EXAM: CHEST - 2 VIEW COMPARISON:  Prior chest radiographs 09/11/2018 and earlier FINDINGS: Unchanged mild cardiomegaly. Aortic atherosclerosis. No appreciable airspace consolidation or pulmonary edema. No sizable pleural effusion or evidence of pneumothorax. No acute bony abnormality identified. Multiple metallic clips project in the region of the right axilla. Additionally, surgical clips are present within the upper abdomen. IMPRESSION: No evidence of acute cardiopulmonary abnormality. Unchanged mild cardiomegaly. Aortic Atherosclerosis (ICD10-I70.0). Electronically Signed   By: Kellie Simmering DO   On: 04/22/2020 12:09   CT ABDOMEN PELVIS W CONTRAST  Result Date: 04/22/2020 CLINICAL DATA:  74 year old female with history of abdominal pain. Suspected hernia. EXAM: CT ABDOMEN AND PELVIS WITH CONTRAST TECHNIQUE:  Multidetector CT imaging of the abdomen and pelvis was performed using the standard protocol following bolus administration of intravenous contrast. CONTRAST:  161mL OMNIPAQUE IOHEXOL 300 MG/ML  SOLN COMPARISON:  CT the abdomen and pelvis 09/04/2015. FINDINGS: Lower chest: Atherosclerotic calcifications in the left anterior descending, left circumflex and right coronary arteries. Severe calcifications of the aortic valve. Mild scarring and pleural thickening in the lung bases bilaterally. Hepatobiliary: No suspicious cystic or solid hepatic lesions. No intra or extrahepatic biliary ductal dilatation. Status post  cholecystectomy. Pancreas: No pancreatic mass. No pancreatic ductal dilatation. No pancreatic or peripancreatic fluid collections or inflammatory changes. Spleen: Unremarkable. Adrenals/Urinary Tract: Low-attenuation lesions in both kidneys measuring up to 1.4 cm in the lower pole of the right kidney, compatible with cysts. Small amount of parenchymal thinning in the posterior aspect of the interpolar region of the left kidney, presumably post infectious scarring. No suspicious renal lesions. No hydroureteronephrosis. Bilateral adrenal glands are normal in appearance. Urinary bladder is unremarkable in appearance. Stomach/Bowel: Normal appearance of the stomach. Multiple dilated loops of mid to distal small bowel which measure up to 4.3 cm in diameter in the low anatomic pelvis where there is also some feculent contents in the small bowel, indicative of some small bowel stasis. Segment of mid to distal small bowel extends into a moderate to large infraumbilical ventral hernia. Postoperative changes of what appears to be partial right hemicolectomy. Some gas and stool is noted throughout the colon and rectum. Vascular/Lymphatic: Aortic atherosclerosis, without evidence of aneurysm or dissection in the abdominal or pelvic vasculature. No lymphadenopathy noted in the abdomen or pelvis. Reproductive: Uterus and ovaries are unremarkable in appearance. Other: No significant volume of ascites.  No pneumoperitoneum. Musculoskeletal: There are no aggressive appearing lytic or blastic lesions noted in the visualized portions of the skeleton. Chronic appearing compression fractures of L1 and L3 are noted with 25% loss of anterior vertebral body height at both levels, similar to the prior study. IMPRESSION: 1. Findings are compatible with early or partial small bowel obstruction related to a moderate to large infraumbilical ventral hernia. Surgical consultation is suggested. 2. Aortic atherosclerosis, in addition to 3 vessel  coronary artery disease. Please note that although the presence of coronary artery calcium documents the presence of coronary artery disease, the severity of this disease and any potential stenosis cannot be assessed on this non-gated CT examination. Assessment for potential risk factor modification, dietary therapy or pharmacologic therapy may be warranted, if clinically indicated. 3. There are calcifications of the aortic valve. Echocardiographic correlation for evaluation of potential valvular dysfunction may be warranted if clinically indicated. 4. Additional incidental findings, as above. Electronically Signed   By: Vinnie Langton M.D.   On: 04/22/2020 19:06    Anti-infectives: Anti-infectives (From admission, onward)   Start     Dose/Rate Route Frequency Ordered Stop   04/23/20 0015  azithromycin (ZITHROMAX) 500 mg in sodium chloride 0.9 % 250 mL IVPB     Discontinue     500 mg 250 mL/hr over 60 Minutes Intravenous Every 24 hours 04/23/20 0008 04/28/20 0014       Assessment/Plan COPD on 3L home O2 Chronic diastolic heart failure CAD Anxiety  PMH partial colectomy ileostomy 2009 PMH ventral hernia   Chronic constipation  pSBO  - afebrile, VSS, WBC 9.0  - continue NG to LIWS  - small bowel protocol today  - clinically the patient is only partially obstructed, whether is it from constipation or adhesions I am unsure. She is having  flatus. She is not obstructed at the level of her hernia. Hopefully the contrast will be therapeutic and will help her have a BM. CCS will follow.    LOS: 1 day    Obie Dredge, [facility] Please see Amion for pager number during day hours 7:00am-4:30pm

## 2020-04-24 ENCOUNTER — Other Ambulatory Visit: Payer: Self-pay | Admitting: Internal Medicine

## 2020-04-24 ENCOUNTER — Encounter: Payer: Medicare Other | Admitting: Internal Medicine

## 2020-04-24 DIAGNOSIS — I5032 Chronic diastolic (congestive) heart failure: Secondary | ICD-10-CM | POA: Diagnosis not present

## 2020-04-24 DIAGNOSIS — N1831 Chronic kidney disease, stage 3a: Secondary | ICD-10-CM | POA: Diagnosis not present

## 2020-04-24 DIAGNOSIS — J301 Allergic rhinitis due to pollen: Secondary | ICD-10-CM

## 2020-04-24 DIAGNOSIS — E785 Hyperlipidemia, unspecified: Secondary | ICD-10-CM

## 2020-04-24 DIAGNOSIS — K56609 Unspecified intestinal obstruction, unspecified as to partial versus complete obstruction: Secondary | ICD-10-CM | POA: Diagnosis not present

## 2020-04-24 DIAGNOSIS — F419 Anxiety disorder, unspecified: Secondary | ICD-10-CM | POA: Diagnosis not present

## 2020-04-24 LAB — BASIC METABOLIC PANEL
Anion gap: 12 (ref 5–15)
BUN: 16 mg/dL (ref 8–23)
CO2: 34 mmol/L — ABNORMAL HIGH (ref 22–32)
Calcium: 10.9 mg/dL — ABNORMAL HIGH (ref 8.9–10.3)
Chloride: 99 mmol/L (ref 98–111)
Creatinine, Ser: 1.1 mg/dL — ABNORMAL HIGH (ref 0.44–1.00)
GFR calc Af Amer: 57 mL/min — ABNORMAL LOW (ref >60)
GFR calc non Af Amer: 49 mL/min — ABNORMAL LOW (ref >60)
Glucose, Bld: 105 mg/dL — ABNORMAL HIGH (ref 70–99)
Potassium: 4.4 mmol/L (ref 3.5–5.1)
Sodium: 145 mmol/L (ref 135–145)

## 2020-04-24 LAB — CBC
HCT: 44.6 % (ref 36.0–46.0)
Hemoglobin: 14 g/dL (ref 12.0–15.0)
MCH: 31.6 pg (ref 26.0–34.0)
MCHC: 31.4 g/dL (ref 30.0–36.0)
MCV: 100.7 fL — ABNORMAL HIGH (ref 80.0–100.0)
Platelets: 225 10*3/uL (ref 150–400)
RBC: 4.43 MIL/uL (ref 3.87–5.11)
RDW: 13.1 % (ref 11.5–15.5)
WBC: 11.3 10*3/uL — ABNORMAL HIGH (ref 4.0–10.5)
nRBC: 0 % (ref 0.0–0.2)

## 2020-04-24 LAB — MAGNESIUM: Magnesium: 2.4 mg/dL (ref 1.7–2.4)

## 2020-04-24 LAB — PHOSPHORUS: Phosphorus: 2.9 mg/dL (ref 2.5–4.6)

## 2020-04-24 MED ORDER — BISACODYL 10 MG RE SUPP
10.0000 mg | Freq: Once | RECTAL | Status: AC
  Administered 2020-04-25: 10 mg via RECTAL
  Filled 2020-04-24: qty 1

## 2020-04-24 MED ORDER — POLYETHYLENE GLYCOL 3350 17 G PO PACK
17.0000 g | PACK | Freq: Every day | ORAL | Status: DC
  Administered 2020-04-24 – 2020-04-25 (×2): 17 g via ORAL
  Filled 2020-04-24 (×3): qty 1

## 2020-04-24 MED ORDER — PHENOL 1.4 % MT LIQD
1.0000 | OROMUCOSAL | Status: DC | PRN
  Administered 2020-04-24: 1 via OROMUCOSAL
  Filled 2020-04-24: qty 177

## 2020-04-24 MED ORDER — SODIUM CHLORIDE 0.9 % IV SOLN: INTRAVENOUS | Status: AC

## 2020-04-24 NOTE — Progress Notes (Addendum)
PROGRESS NOTE  [name] WNI:[id number] DOB: 1946/04/04 DOA: 04/22/2020 PCP: [name], MD   LOS: 2 days   Brief narrative: As per HPI,  [name] is a 74 y.o. female with medical history significant for COPD, chronic respiratory failure on 3 L/min supplemental oxygen , anxiety, chronic diastolic CHF, CAD, and history of partial colectomy, presented to the emergency department with 1 day of abdominal pain, back pain, chest discomfort, nausea, and vomiting.  Patient reported developing severe pain yesterday with nausea and nonbloody vomiting continued to worsen overnight and into today. With symptoms continuing to worsen, she called EMS. She was found to be dyspneic and wheezing with EMS and was treated with albuterol and 125 mg IV Solu-Medrol prior to arrival in ED. Upon arrival to the ED, patient was found to be afebrile, saturating upper 90s on 4 L/min supplemental oxygen, and with stable blood pressure.  EKG features sinus rhythm with PACs.  Chest x-ray negative for acute cardiopulmonary disease.  CT is concerning for early or partial SBO related to large ventral hernia.  Chemistry panel notable for creatinine 1.21.  CBC with mild leukocytosis.  High-sensitivity troponin is normal x2.  Surgery was consulted by the ED physician and the patient was treated with saline bolus, morphine, fentanyl, Zofran, and albuterol.  Patient was then admitted to the hospital.  Assessment/Plan:  Principal Problem:   Small bowel obstruction (HCC) Active Problems:   Chronic diastolic CHF (congestive heart failure) (HCC)   Coronary artery disease involving native coronary artery of native heart without angina pectoris   COPD with acute exacerbation (HCC)   Chronic respiratory failure with hypoxia (HCC)   Anxiety   Chronic renal disease, stage 3, moderately decreased glomerular filtration rate (GFR) between 30-59 mL/min/1.73 square meter   Partial small bowel obstruction. Surgery on board.   SBO protocol yesterday. On  conservative management.  Continue to monitor closely.  History of chronic constipation and manual disimpaction at home.. Added gentle IV fluids.  General surgery has seen the patient today and contrast has passed through the colon.  NG tube will be discontinued today and patient will be advanced on oral diet.  Ambulate the patient..  Throat discomfort. NG tube in place. Will add phenol spray.    Large ventral hernia  No signs of incarceration.  Patient wishes that hernia to be fixed.  COPD with acute exacerbation; chronic hypoxic respiratory failure  Patient has increased cough and wheezing. CXR  negative for infiltrate.  Continue IV steroids, azithromycin, inhalers and supplemental oxygen.  Chronic diastolic CHF   Appears compensated.   monitor daily wt and I/Os .  Watch for fluid overload.   History of CAD Mild chest/abdominal discomfort in the ED.  Normal troponins.  Secondary to SBO.  CKD IIIa   SCr is 1.21 on admission .  Creatinine of 1.0 today.  We will continue to monitor closely.  DVT prophylaxis: SCDs Start: 04/22/20 2322   Code Status: Full code  Family Communication: Spoke with the patient's daughter Ms [name] and updated her about the clinical condition of the patient.  Spoke with the family at bedside.  Status is: Inpatient  Remains inpatient appropriate because:Unsafe d/c plan, IV treatments appropriate due to intensity of illness or inability to take PO and Inpatient level of care appropriate due to severity of illness   Dispo: The patient is from: Home              Anticipated d/c is to: Home  Anticipated d/c date is: 1 day if continues to improve.              Patient currently is not medically stable to d/c. Consultants:  General surgery  Procedures:  NG tube placement  Antibiotics:  . Azithromycin IV  Anti-infectives (From admission, onward)   Start     Dose/Rate Route Frequency Ordered Stop   04/23/20 0015   azithromycin (ZITHROMAX) 500 mg in sodium chloride 0.9 % 250 mL IVPB     Discontinue     500 mg 250 mL/hr over 60 Minutes Intravenous Every 24 hours 04/23/20 0008 04/28/20 0014     Subjective: Today, patient was seen and examined at bedside.  Patient denies any nausea vomiting or abdominal pain.  No bowel movements but had some flatus.  Objective: Vitals:   04/24/20 0029 04/24/20 0536  BP: (!) 144/71 (!) 150/51  Pulse: 86 75  Resp: 18 18  Temp: 98.5 F (36.9 C) 98.5 F (36.9 C)  SpO2:  100%    Intake/Output Summary (Last 24 hours) at 04/24/2020 0752 Last data filed at 04/24/2020 0536 Gross per 24 hour  Intake 0 ml  Output 0 ml  Net 0 ml   Filed Weights   04/22/20 1135 04/23/20 2040  Weight: 49.9 kg 49.9 kg   Body mass index is 20.12 kg/m.   Physical Exam: GENERAL: Patient is alert awake and oriented. Not in obvious distress.  On nasal cannula oxygen.  NG tube in place. HENT: No scleral pallor or icterus. Pupils equally reactive to light. Oral mucosa is moist NECK: is supple, no gross swelling noted. CHEST:   Diminished breath sounds bilaterally.  No crackles or wheezes in place. CVS: S1 and S2 heard, no murmur. Regular rate and rhythm.  ABDOMEN: Soft, distended abdomen with ventral hernia which is reducible, EXTREMITIES: No edema. CNS: Cranial nerves are intact. No focal motor deficits. SKIN: warm and dry without rashes.  Data Review: I have personally reviewed the following laboratory data and studies,  CBC: Recent Labs  Lab 04/22/20 1150 04/23/20 0500 04/24/20 0131  WBC 11.3* 9.0 11.3*  HGB 13.3 12.7 14.0  HCT 42.5 41.5 44.6  MCV 100.0 101.0* 100.7*  PLT 258 153 619   Basic Metabolic Panel: Recent Labs  Lab 04/22/20 1150 04/23/20 0500 04/24/20 0131  NA 141 141 145  K 3.5 4.2 4.4  CL 94* 99 99  CO2 34* 32 34*  GLUCOSE 123* 109* 105*  BUN 15 12 16   CREATININE 1.21* 1.09* 1.10*  CALCIUM 10.7* 9.9 10.9*  MG  --   --  2.4  PHOS  --   --  2.9    Liver Function Tests: No results for input(s): AST, ALT, ALKPHOS, BILITOT, PROT, ALBUMIN in the last 168 hours. No results for input(s): LIPASE, AMYLASE in the last 168 hours. No results for input(s): AMMONIA in the last 168 hours. Cardiac Enzymes: No results for input(s): CKTOTAL, CKMB, CKMBINDEX, TROPONINI in the last 168 hours. BNP (last 3 results) No results for input(s): BNP in the last 8760 hours.  ProBNP (last 3 results) No results for input(s): PROBNP in the last 8760 hours.  CBG: No results for input(s): GLUCAP in the last 168 hours. Recent Results (from the past 240 hour(s))  SARS Coronavirus 2 by RT PCR     Status: None   Collection Time: 04/22/20  9:26 PM  Result Value Ref Range Status   SARS Coronavirus 2 NEGATIVE NEGATIVE Final    Comment: (NOTE) Result  indicates the ABSENCE of SARS-CoV-2 RNA in the patient specimen.   The lowest concentration of SARS-CoV-2 viral copies this assay can detect in nasopharyngeal swab specimens is 500 copies / mL.  A negative result does not preclude SARS-CoV-2 infection and should not be used as the sole basis for patient management decisions. A negative result may occur with improper specimen collection / handling, submission of a specimen other than nasopharyngeal swab, presence of viral mutation(s) within the areas targeted by this assay, and inadequate number of viral copies (<500 copies / mL) present.  Negative results must be combined with clinical observations, patient history, and epidemiological information.   The expected result is NEGATIVE.   Patient Fact Sheet:  BlogSelections.co.uk    Provider Fact Sheet:  https://lucas.com/    This test is not yet approved or cleared by the [place] FDA and  has been [place] orized for detection and/or diagnosis of SARS-CoV-2 by FDA under an Emergency Use Authorization (EUA).  This EUA will remain in effect (meaning this test can  be used) for the duration of  the COVID-19 declaration under Section 564(b)(1) of the Act, 21 U.S.C. section 360bbb-3(b)(1), unless the authorization is terminated or revoked sooner  Performed at [facility], [place] [address] [place], [place] [address]      Studies: DG Chest 2 View  Result Date: 04/22/2020 CLINICAL DATA:  Chest pain. EXAM: CHEST - 2 VIEW COMPARISON:  Prior chest radiographs 09/11/2018 and earlier FINDINGS: Unchanged mild cardiomegaly. Aortic atherosclerosis. No appreciable airspace consolidation or pulmonary edema. No sizable pleural effusion or evidence of pneumothorax. No acute bony abnormality identified. Multiple metallic clips project in the region of the right axilla. Additionally, surgical clips are present within the upper abdomen. IMPRESSION: No evidence of acute cardiopulmonary abnormality. Unchanged mild cardiomegaly. Aortic Atherosclerosis (ICD10-I70.0). Electronically Signed   By: Kellie Simmering DO   On: 04/22/2020 12:09   CT ABDOMEN PELVIS W CONTRAST  Result Date: 04/22/2020 CLINICAL DATA:  74 year old female with history of abdominal pain. Suspected hernia. EXAM: CT ABDOMEN AND PELVIS WITH CONTRAST TECHNIQUE: Multidetector CT imaging of the abdomen and pelvis was performed using the standard protocol following bolus administration of intravenous contrast. CONTRAST:  129mL OMNIPAQUE IOHEXOL 300 MG/ML  SOLN COMPARISON:  CT the abdomen and pelvis 09/04/2015. FINDINGS: Lower chest: Atherosclerotic calcifications in the left anterior descending, left circumflex and right coronary arteries. Severe calcifications of the aortic valve. Mild scarring and pleural thickening in the lung bases bilaterally. Hepatobiliary: No suspicious cystic or solid hepatic lesions. No intra or extrahepatic biliary ductal dilatation. Status post cholecystectomy. Pancreas: No pancreatic mass. No pancreatic ductal dilatation. No pancreatic or peripancreatic fluid collections or inflammatory  changes. Spleen: Unremarkable. Adrenals/Urinary Tract: Low-attenuation lesions in both kidneys measuring up to 1.4 cm in the lower pole of the right kidney, compatible with cysts. Small amount of parenchymal thinning in the posterior aspect of the interpolar region of the left kidney, presumably post infectious scarring. No suspicious renal lesions. No hydroureteronephrosis. Bilateral adrenal glands are normal in appearance. Urinary bladder is unremarkable in appearance. Stomach/Bowel: Normal appearance of the stomach. Multiple dilated loops of mid to distal small bowel which measure up to 4.3 cm in diameter in the low anatomic pelvis where there is also some feculent contents in the small bowel, indicative of some small bowel stasis. Segment of mid to distal small bowel extends into a moderate to large infraumbilical ventral hernia. Postoperative changes of what appears to be partial right hemicolectomy. Some gas  and stool is noted throughout the colon and rectum. Vascular/Lymphatic: Aortic atherosclerosis, without evidence of aneurysm or dissection in the abdominal or pelvic vasculature. No lymphadenopathy noted in the abdomen or pelvis. Reproductive: Uterus and ovaries are unremarkable in appearance. Other: No significant volume of ascites.  No pneumoperitoneum. Musculoskeletal: There are no aggressive appearing lytic or blastic lesions noted in the visualized portions of the skeleton. Chronic appearing compression fractures of L1 and L3 are noted with 25% loss of anterior vertebral body height at both levels, similar to the prior study. IMPRESSION: 1. Findings are compatible with early or partial small bowel obstruction related to a moderate to large infraumbilical ventral hernia. Surgical consultation is suggested. 2. Aortic atherosclerosis, in addition to 3 vessel coronary artery disease. Please note that although the presence of coronary artery calcium documents the presence of coronary artery disease, the  severity of this disease and any potential stenosis cannot be assessed on this non-gated CT examination. Assessment for potential risk factor modification, dietary therapy or pharmacologic therapy may be warranted, if clinically indicated. 3. There are calcifications of the aortic valve. Echocardiographic correlation for evaluation of potential valvular dysfunction may be warranted if clinically indicated. 4. Additional incidental findings, as above. Electronically Signed   By: Vinnie Langton M.D.   On: 04/22/2020 19:06   DG Abd Portable 1V-Small Bowel Obstruction Protocol-initial, 8 hr delay  Result Date: 04/23/2020 CLINICAL DATA:  74 year old female with small bowel obstruction EXAM: PORTABLE ABDOMEN - 1 VIEW COMPARISON:  Abdominal radiograph dated 04/23/2020. FINDINGS: Partially visualized enteric tube with tip in the right upper abdomen likely in the distal stomach in the region of the gastric antrum. Oral contrast noted throughout the colon. No small bowel dilatation. No free air. Right upper quadrant cholecystectomy clips. Several surgical clips noted over the abdomen. Degenerative changes of the spine. No acute osseous pathology. IMPRESSION: Partially visualized enteric tube with tip in the distal stomach. No small bowel dilatation. Electronically Signed   By: Anner Crete M.D.   On: 04/23/2020 17:25   DG Abd Portable 1V-Small Bowel Protocol-Position Verification  Result Date: 04/23/2020 CLINICAL DATA:  NG tube placement EXAM: PORTABLE ABDOMEN - 1 VIEW COMPARISON:  CT 04/22/2020 FINDINGS: Interval placement of nasogastric tube which courses below the diaphragm and distal tip projecting near the midline of the mid abdomen likely at the junction of the second and third portions of the duodenum. No dilated loops of small bowel are appreciated. Moderate volume of stool throughout the colon. Excreted contrast within the urinary bladder. Mild wedge deformities of L1 and L3 as seen on prior CT.  IMPRESSION: Interval placement of nasogastric tube with distal tip projecting near the midline of the mid abdomen likely at the junction of the second and third portions of the duodenum. Electronically Signed   By: Davina Poke D.O.   On: 04/23/2020 08:08      Flora Lipps, MD  [facility] 04/24/2020

## 2020-04-24 NOTE — Progress Notes (Signed)
[facility] Progress Note     Subjective: CC:  [name]. denies abd pain currently. Patient is frustrated and wants her hernia fixed. She is having flatus. Denies BM.   Objective: Vital signs in last 24 hours: Temp:  [97.9 F (36.6 C)-99.1 F (37.3 C)] 98.8 F (37.1 C) (07/29 0919) Pulse Rate:  [72-119] 73 (07/29 0919) Resp:  [17-23] 18 (07/29 0919) BP: (132-159)/(51-98) 159/77 (07/29 0919) SpO2:  [96 %-100 %] 100 % (07/29 0919) Weight:  [49.9 kg] 49.9 kg (07/28 2040) Last BM Date: 04/21/20  Intake/Output from previous day: 07/28 0701 - 07/29 0700 In: 370 [P.O.:120; IV Piggyback:250] Out: 0  Intake/Output this shift: No intake/output data recorded.  PE: Gen:  Alert, NAD, pleasant Card:  Regular rate and rhythm, pedal pulses 2+ BL Pulm:  Normal effort on [place], CTAB Abd: Soft, non-tender, mild distention, lower ventral hernia is soft and reducible  NG removed at bedside.  Skin: warm and dry, no rashes  Psych: A&Ox3   Lab Results:  Recent Labs    04/23/20 0500 04/24/20 0131  WBC 9.0 11.3*  HGB 12.7 14.0  HCT 41.5 44.6  PLT 153 225   BMET Recent Labs    04/23/20 0500 04/24/20 0131  NA 141 145  K 4.2 4.4  CL 99 99  CO2 32 34*  GLUCOSE 109* 105*  BUN 12 16  CREATININE 1.09* 1.10*  CALCIUM 9.9 10.9*   PT/INR No results for input(s): LABPROT, INR in the last 72 hours. CMP     Component Value Date/Time   NA 145 04/24/2020 0131   NA 140 06/17/2014 1330   K 4.4 04/24/2020 0131   K 4.3 06/17/2014 1330   CL 99 04/24/2020 0131   CO2 34 (H) 04/24/2020 0131   CO2 31 (H) 06/17/2014 1330   GLUCOSE 105 (H) 04/24/2020 0131   GLUCOSE 92 06/17/2014 1330   BUN 16 04/24/2020 0131   BUN 12.8 06/17/2014 1330   CREATININE 1.10 (H) 04/24/2020 0131   CREATININE 1.1 06/17/2014 1330   CALCIUM 10.9 (H) 04/24/2020 0131   CALCIUM 10.7 (H) 06/17/2014 1330   PROT 7.5 03/19/2019 1402   PROT 7.9 06/17/2014 1330   ALBUMIN 4.1 03/19/2019 1402   ALBUMIN 3.6  06/17/2014 1330   AST 28 03/19/2019 1402   AST 22 06/17/2014 1330   ALT 18 03/19/2019 1402   ALT 18 06/17/2014 1330   ALKPHOS 54 03/19/2019 1402   ALKPHOS 65 06/17/2014 1330   BILITOT 0.4 03/19/2019 1402   BILITOT 0.34 06/17/2014 1330   GFRNONAA 49 (L) 04/24/2020 0131   GFRAA 57 (L) 04/24/2020 0131   Lipase     Component Value Date/Time   LIPASE 28 09/07/2015 0510       Studies/Results: DG Chest 2 View  Result Date: 04/22/2020 CLINICAL DATA:  Chest pain. EXAM: CHEST - 2 VIEW COMPARISON:  Prior chest radiographs 09/11/2018 and earlier FINDINGS: Unchanged mild cardiomegaly. Aortic atherosclerosis. No appreciable airspace consolidation or pulmonary edema. No sizable pleural effusion or evidence of pneumothorax. No acute bony abnormality identified. Multiple metallic clips project in the region of the right axilla. Additionally, surgical clips are present within the upper abdomen. IMPRESSION: No evidence of acute cardiopulmonary abnormality. Unchanged mild cardiomegaly. Aortic Atherosclerosis (ICD10-I70.0). Electronically Signed   By: Kellie Simmering DO   On: 04/22/2020 12:09   CT ABDOMEN PELVIS W CONTRAST  Result Date: 04/22/2020 CLINICAL DATA:  74 year old female with history of abdominal pain. Suspected hernia. EXAM: CT ABDOMEN AND PELVIS WITH  CONTRAST TECHNIQUE: Multidetector CT imaging of the abdomen and pelvis was performed using the standard protocol following bolus administration of intravenous contrast. CONTRAST:  113mL OMNIPAQUE IOHEXOL 300 MG/ML  SOLN COMPARISON:  CT the abdomen and pelvis 09/04/2015. FINDINGS: Lower chest: Atherosclerotic calcifications in the left anterior descending, left circumflex and right coronary arteries. Severe calcifications of the aortic valve. Mild scarring and pleural thickening in the lung bases bilaterally. Hepatobiliary: No suspicious cystic or solid hepatic lesions. No intra or extrahepatic biliary ductal dilatation. Status post cholecystectomy.  Pancreas: No pancreatic mass. No pancreatic ductal dilatation. No pancreatic or peripancreatic fluid collections or inflammatory changes. Spleen: Unremarkable. Adrenals/Urinary Tract: Low-attenuation lesions in both kidneys measuring up to 1.4 cm in the lower pole of the right kidney, compatible with cysts. Small amount of parenchymal thinning in the posterior aspect of the interpolar region of the left kidney, presumably post infectious scarring. No suspicious renal lesions. No hydroureteronephrosis. Bilateral adrenal glands are normal in appearance. Urinary bladder is unremarkable in appearance. Stomach/Bowel: Normal appearance of the stomach. Multiple dilated loops of mid to distal small bowel which measure up to 4.3 cm in diameter in the low anatomic pelvis where there is also some feculent contents in the small bowel, indicative of some small bowel stasis. Segment of mid to distal small bowel extends into a moderate to large infraumbilical ventral hernia. Postoperative changes of what appears to be partial right hemicolectomy. Some gas and stool is noted throughout the colon and rectum. Vascular/Lymphatic: Aortic atherosclerosis, without evidence of aneurysm or dissection in the abdominal or pelvic vasculature. No lymphadenopathy noted in the abdomen or pelvis. Reproductive: Uterus and ovaries are unremarkable in appearance. Other: No significant volume of ascites.  No pneumoperitoneum. Musculoskeletal: There are no aggressive appearing lytic or blastic lesions noted in the visualized portions of the skeleton. Chronic appearing compression fractures of L1 and L3 are noted with 25% loss of anterior vertebral body height at both levels, similar to the prior study. IMPRESSION: 1. Findings are compatible with early or partial small bowel obstruction related to a moderate to large infraumbilical ventral hernia. Surgical consultation is suggested. 2. Aortic atherosclerosis, in addition to 3 vessel coronary artery  disease. Please note that although the presence of coronary artery calcium documents the presence of coronary artery disease, the severity of this disease and any potential stenosis cannot be assessed on this non-gated CT examination. Assessment for potential risk factor modification, dietary therapy or pharmacologic therapy may be warranted, if clinically indicated. 3. There are calcifications of the aortic valve. Echocardiographic correlation for evaluation of potential valvular dysfunction may be warranted if clinically indicated. 4. Additional incidental findings, as above. Electronically Signed   By: Vinnie Langton M.D.   On: 04/22/2020 19:06   DG Abd Portable 1V-Small Bowel Obstruction Protocol-initial, 8 hr delay  Result Date: 04/23/2020 CLINICAL DATA:  74 year old female with small bowel obstruction EXAM: PORTABLE ABDOMEN - 1 VIEW COMPARISON:  Abdominal radiograph dated 04/23/2020. FINDINGS: Partially visualized enteric tube with tip in the right upper abdomen likely in the distal stomach in the region of the gastric antrum. Oral contrast noted throughout the colon. No small bowel dilatation. No free air. Right upper quadrant cholecystectomy clips. Several surgical clips noted over the abdomen. Degenerative changes of the spine. No acute osseous pathology. IMPRESSION: Partially visualized enteric tube with tip in the distal stomach. No small bowel dilatation. Electronically Signed   By: Anner Crete M.D.   On: 04/23/2020 17:25   DG Abd Portable 1V-Small Bowel Protocol-Position  Verification  Result Date: 04/23/2020 CLINICAL DATA:  NG tube placement EXAM: PORTABLE ABDOMEN - 1 VIEW COMPARISON:  CT 04/22/2020 FINDINGS: Interval placement of nasogastric tube which courses below the diaphragm and distal tip projecting near the midline of the mid abdomen likely at the junction of the second and third portions of the duodenum. No dilated loops of small bowel are appreciated. Moderate volume of stool  throughout the colon. Excreted contrast within the urinary bladder. Mild wedge deformities of L1 and L3 as seen on prior CT. IMPRESSION: Interval placement of nasogastric tube with distal tip projecting near the midline of the mid abdomen likely at the junction of the second and third portions of the duodenum. Electronically Signed   By: Davina Poke D.O.   On: 04/23/2020 08:08    Anti-infectives: Anti-infectives (From admission, onward)   Start     Dose/Rate Route Frequency Ordered Stop   04/23/20 0015  azithromycin (ZITHROMAX) 500 mg in sodium chloride 0.9 % 250 mL IVPB     Discontinue     500 mg 250 mL/hr over 60 Minutes Intravenous Every 24 hours 04/23/20 0008 04/28/20 0014       Assessment/Plan COPD on 3L home O2 Chronic diastolic heart failure CAD Anxiety  PMH partial colectomy ileostomy 2009 PMH ventral hernia   Chronic constipation  pSBO  - small bowel protocol 7/28 >> contrast in the colon  - patient is having flatus and is clinically non-obstructed, D/C NG tube, SOFT diet - patient should resume home lactulose, I also ordered miralax and a suppository for her chronic constipation. - No acute surgical needs. Can follow up outpatient if she would like to discuss elective hernia repair. Would need to be cleared by cardiology and anesthesia given CHF and COPD on home O2. Surgery would not be straight forward give her multiple open abdominal operations.   LOS: 2 days    Obie Dredge, [facility] Please see Amion for pager number during day hours 7:00am-4:30pm

## 2020-04-25 ENCOUNTER — Ambulatory Visit: Payer: Medicare Other

## 2020-04-25 DIAGNOSIS — I5032 Chronic diastolic (congestive) heart failure: Secondary | ICD-10-CM | POA: Diagnosis not present

## 2020-04-25 DIAGNOSIS — F419 Anxiety disorder, unspecified: Secondary | ICD-10-CM | POA: Diagnosis not present

## 2020-04-25 DIAGNOSIS — N1831 Chronic kidney disease, stage 3a: Secondary | ICD-10-CM | POA: Diagnosis not present

## 2020-04-25 DIAGNOSIS — K56609 Unspecified intestinal obstruction, unspecified as to partial versus complete obstruction: Secondary | ICD-10-CM | POA: Diagnosis not present

## 2020-04-25 LAB — BASIC METABOLIC PANEL
Anion gap: 9 (ref 5–15)
BUN: 19 mg/dL (ref 8–23)
CO2: 31 mmol/L (ref 22–32)
Calcium: 9.6 mg/dL (ref 8.9–10.3)
Chloride: 104 mmol/L (ref 98–111)
Creatinine, Ser: 1.02 mg/dL — ABNORMAL HIGH (ref 0.44–1.00)
GFR calc Af Amer: >60 mL/min (ref >60)
GFR calc non Af Amer: 54 mL/min — ABNORMAL LOW (ref >60)
Glucose, Bld: 114 mg/dL — ABNORMAL HIGH (ref 70–99)
Potassium: 3.7 mmol/L (ref 3.5–5.1)
Sodium: 144 mmol/L (ref 135–145)

## 2020-04-25 LAB — MAGNESIUM: Magnesium: 1.9 mg/dL (ref 1.7–2.4)

## 2020-04-25 LAB — CBC
HCT: 36.4 % (ref 36.0–46.0)
Hemoglobin: 11.2 g/dL — ABNORMAL LOW (ref 12.0–15.0)
MCH: 31 pg (ref 26.0–34.0)
MCHC: 30.8 g/dL (ref 30.0–36.0)
MCV: 100.8 fL — ABNORMAL HIGH (ref 80.0–100.0)
Platelets: 174 10*3/uL (ref 150–400)
RBC: 3.61 MIL/uL — ABNORMAL LOW (ref 3.87–5.11)
RDW: 12.8 % (ref 11.5–15.5)
WBC: 8.8 10*3/uL (ref 4.0–10.5)
nRBC: 0 % (ref 0.0–0.2)

## 2020-04-25 LAB — PHOSPHORUS: Phosphorus: 3.2 mg/dL (ref 2.5–4.6)

## 2020-04-25 MED ORDER — LACTULOSE 10 GM/15ML PO SOLN
45.0000 g | Freq: Three times a day (TID) | ORAL | Status: DC | PRN
  Filled 2020-04-25: qty 90

## 2020-04-25 MED ORDER — POLYETHYLENE GLYCOL 3350 17 G PO PACK: 17.0000 g | PACK | Freq: Every day | ORAL | 0 refills | Status: DC | PRN

## 2020-04-25 MED ORDER — PREDNISONE 10 MG PO TABS
30.0000 mg | ORAL_TABLET | Freq: Every day | ORAL | 0 refills | Status: AC
Start: 2020-04-25 — End: 2020-04-28

## 2020-04-25 NOTE — Progress Notes (Signed)
[facility] Progress Note     Subjective: CC:  NAEO. denies abd pain currently. +flatus. No BM. Tolerating SOFT diet.  Objective: Vital signs in last 24 hours: Temp:  [97.4 F (36.3 C)-98.5 F (36.9 C)] 98.3 F (36.8 C) (07/30 0851) Pulse Rate:  [64-75] 64 (07/30 0851) Resp:  [18] 18 (07/30 0851) BP: (111-140)/(64-81) 138/64 (07/30 0851) SpO2:  [92 %-100 %] 97 % (07/30 0851) Last BM Date: 04/24/20  Intake/Output from previous day: 07/29 0701 - 07/30 0700 In: 2784 [P.O.:1140; I.V.:1394; IV Piggyback:250] Out: 250 [Emesis/NG output:250] Intake/Output this shift: Total I/O In: 300 [P.O.:300] Out: -   PE: Gen:  Alert, NAD, pleasant Card:  Regular rate and rhythm, pedal pulses 2+ BL Pulm:  Normal effort on [place], CTAB Abd: Soft, non-tender, non-distended, lower ventral hernia is soft and reducible  NG removed at bedside.  Skin: warm and dry, no rashes  Psych: A&Ox3   Lab Results:  Recent Labs    04/24/20 0131 04/25/20 0643  WBC 11.3* 8.8  HGB 14.0 11.2*  HCT 44.6 36.4  PLT 225 174   BMET Recent Labs    04/24/20 0131 04/25/20 0643  NA 145 144  K 4.4 3.7  CL 99 104  CO2 34* 31  GLUCOSE 105* 114*  BUN 16 19  CREATININE 1.10* 1.02*  CALCIUM 10.9* 9.6   PT/INR No results for input(s): LABPROT, INR in the last 72 hours. CMP     Component Value Date/Time   NA 144 04/25/2020 0643   NA 140 06/17/2014 1330   K 3.7 04/25/2020 0643   K 4.3 06/17/2014 1330   CL 104 04/25/2020 0643   CO2 31 04/25/2020 0643   CO2 31 (H) 06/17/2014 1330   GLUCOSE 114 (H) 04/25/2020 0643   GLUCOSE 92 06/17/2014 1330   BUN 19 04/25/2020 0643   BUN 12.8 06/17/2014 1330   CREATININE 1.02 (H) 04/25/2020 0643   CREATININE 1.1 06/17/2014 1330   CALCIUM 9.6 04/25/2020 0643   CALCIUM 10.7 (H) 06/17/2014 1330   PROT 7.5 03/19/2019 1402   PROT 7.9 06/17/2014 1330   ALBUMIN 4.1 03/19/2019 1402   ALBUMIN 3.6 06/17/2014 1330   AST 28 03/19/2019 1402   AST 22 06/17/2014  1330   ALT 18 03/19/2019 1402   ALT 18 06/17/2014 1330   ALKPHOS 54 03/19/2019 1402   ALKPHOS 65 06/17/2014 1330   BILITOT 0.4 03/19/2019 1402   BILITOT 0.34 06/17/2014 1330   GFRNONAA 54 (L) 04/25/2020 0643   GFRAA >60 04/25/2020 0643   Lipase     Component Value Date/Time   LIPASE 28 09/07/2015 0510       Studies/Results: DG Abd Portable 1V-Small Bowel Obstruction Protocol-initial, 8 hr delay  Result Date: 04/23/2020 CLINICAL DATA:  74 year old female with small bowel obstruction EXAM: PORTABLE ABDOMEN - 1 VIEW COMPARISON:  Abdominal radiograph dated 04/23/2020. FINDINGS: Partially visualized enteric tube with tip in the right upper abdomen likely in the distal stomach in the region of the gastric antrum. Oral contrast noted throughout the colon. No small bowel dilatation. No free air. Right upper quadrant cholecystectomy clips. Several surgical clips noted over the abdomen. Degenerative changes of the spine. No acute osseous pathology. IMPRESSION: Partially visualized enteric tube with tip in the distal stomach. No small bowel dilatation. Electronically Signed   By: Anner Crete M.D.   On: 04/23/2020 17:25    Anti-infectives: Anti-infectives (From admission, onward)   Start     Dose/Rate Route Frequency Ordered Stop  04/23/20 0015  azithromycin (ZITHROMAX) 500 mg in sodium chloride 0.9 % 250 mL IVPB     Discontinue     500 mg 250 mL/hr over 60 Minutes Intravenous Every 24 hours 04/23/20 0008 04/28/20 0014       Assessment/Plan COPD on 3L home O2 Chronic diastolic heart failure CAD Anxiety  PMH partial colectomy ileostomy 2009 PMH ventral hernia   Chronic constipation  pSBO  - small bowel protocol 7/28 >> contrast in the colon  - patient is having flatus and is clinically non-obstructed, continue SOFT diet - still no BM, suspect related to chronic constipation. Re-order home lactulose. Consider an enema. - No acute surgical needs. Can follow up outpatient if she  would like to discuss elective hernia repair. Would need to be cleared by cardiology and anesthesia given CHF and COPD on home O2. Surgery would not be straight forward give her multiple open abdominal operations.   LOS: 3 days    Obie Dredge, [facility] Please see Amion for pager number during day hours 7:00am-4:30pm

## 2020-04-25 NOTE — Discharge Summary (Signed)
Physician Discharge Summary  [name] WNI:[id number] DOB: 10-23-1945 DOA: 04/22/2020  PCP: [name], MD  Admit date: 04/22/2020 Discharge date: 04/25/2020  Admitted From: Home  Discharge disposition: home   Recommendations for Outpatient Follow-Up:   . Follow up with your primary care provider in one week.  . Check CBC, BMP, mg in the next visit.  Discharge Diagnosis:   Principal Problem:   Partial Small bowel obstruction (HCC) Active Problems:   Chronic diastolic CHF (congestive heart failure) (HCC)   Coronary artery disease involving native coronary artery of native heart without angina pectoris   COPD with acute exacerbation (HCC)   Chronic respiratory failure with hypoxia (HCC)   Anxiety   Chronic renal disease, stage 3, moderately decreased glomerular filtration rate (GFR) between 30-59 mL/min/1.73 square meter   Discharge Condition: Improved.  Diet recommendation: Low sodium, heart healthy.    Wound care: None.  Code status: Full.   History of Present Illness:  [name] is a 74 y.o. female with medical history significant for COPD, chronic respiratory failure on 3 L/min supplemental oxygen , anxiety, chronic diastolic CHF, CAD, and history of partial colectomy, presented to the emergency department with 1 day of abdominal pain, back pain, chest discomfort, nausea, and vomiting.  Patient reported developing severe pain yesterday with nausea and nonbloody vomiting continued to worsen overnight and into today. With symptoms continuing to worsen, she called EMS. She was found to be dyspneic and wheezing with EMS and was treated with albuterol and 125 mg IV Solu-Medrol prior to arrival in ED. Upon arrival to the ED, patient was found to be afebrile, saturating upper 90s on 4 L/min supplemental oxygen, and with stable blood pressure.  EKG features sinus rhythm with PACs.  Chest x-ray negative for acute cardiopulmonary disease.  CT is concerning for early  or partial SBO related to large ventral hernia.  Chemistry panel notable for creatinine 1.21.  CBC with mild leukocytosis.  High-sensitivity troponin is normal x2.  Surgery was consulted by the ED physician and the patient was treated with saline bolus, morphine, fentanyl, Zofran, and albuterol.  Patient was then admitted to the hospital.   Hospital Course:   Following conditions were addressed during hospitalization as listed below,  Partial small bowel obstruction. Patient underwent a SBO protocol with passage of contrast through the colon.  Initially, patient was put on conservative management with IV fluids n.p.o. antiemetics.  Patient clinically improved.  Patient does have history of  chronic constipation and manual disimpaction at home.. Added MiraLAX on discharge.  Patient is already on lactulose at home.    Large ventral hernia  No signs of incarceration.  Patient wishes that hernia to be fixed.  General surgery saw the patient and patient is high risk for surgery and has to be cleared by anesthesia and cardiology if she were to undergo surgical intervention in the future.   COPD with acute exacerbation; chronic hypoxic respiratory failure  Patient had increased cough and wheezing. CXR   was negative for infiltrate.  Received IV steroids, azithromycin, inhalers and supplemental oxygen.  Completed azithromycin 3-day course.  Will prescribe prednisone for the next 3 days and then stop.  Currently compensated.    Chronic diastolic CHF  Remained compensated.  Resume Lasix from home.   History of CAD No acute issues.   CKD IIIa   SCr is 1.21 on admission .  Creatinine of 1.0 on discharge.  Disposition.  At this time, patient is stable  for disposition home.  Spoke with the patient's daughter on the phone yesterday regarding the disposition home today.  Medical Consultants:    General surgery  Procedures:    SBO protocol Subjective:   Today, patient feels okay.  Has tolerated  oral diet.  Has had a bowel movement with laxatives.  Denies overt abdominal pain.  No nausea vomiting.  Discharge Exam:   Vitals:   04/25/20 0838 04/25/20 0851  BP:  (!) 138/64  Pulse:  64  Resp:  18  Temp:  98.3 F (36.8 C)  SpO2: 98% 97%   Vitals:   04/24/20 2127 04/25/20 0609 04/25/20 0838 04/25/20 0851  BP: 111/79 (!) 140/81  (!) 138/64  Pulse: 75 70  64  Resp: 18 18  18   Temp: 98.5 F (36.9 C) (!) 97.4 F (36.3 C)  98.3 F (36.8 C)  TempSrc: Oral Oral  Oral  SpO2: 92% 97% 98% 97%  Weight:      Height:       General: Alert awake, not in obvious distress, on nasal cannula oxygen. HENT: pupils equally reacting to light,  No scleral pallor or icterus noted. Oral mucosa is moist.  Chest:  Clear breath sounds.  Diminished breath sounds bilaterally. No crackles or wheezes.  CVS: S1 &S2 heard. No murmur.  Regular rate and rhythm. Abdomen: Soft, nontender, ventral reducible hernia.  Bowel sounds present. Extremities: No cyanosis, clubbing or edema.  Peripheral pulses are palpable. Psych: Alert, awake and oriented, normal mood CNS:  No cranial nerve deficits.  Power equal in all extremities.   Skin: Warm and dry.  No rashes noted.  The results of significant diagnostics from this hospitalization (including imaging, microbiology, ancillary and laboratory) are listed below for reference.     Diagnostic Studies:   DG Chest 2 View  Result Date: 04/22/2020 CLINICAL DATA:  Chest pain. EXAM: CHEST - 2 VIEW COMPARISON:  Prior chest radiographs 09/11/2018 and earlier FINDINGS: Unchanged mild cardiomegaly. Aortic atherosclerosis. No appreciable airspace consolidation or pulmonary edema. No sizable pleural effusion or evidence of pneumothorax. No acute bony abnormality identified. Multiple metallic clips project in the region of the right axilla. Additionally, surgical clips are present within the upper abdomen. IMPRESSION: No evidence of acute cardiopulmonary abnormality. Unchanged mild  cardiomegaly. Aortic Atherosclerosis (ICD10-I70.0). Electronically Signed   By: Kellie Simmering DO   On: 04/22/2020 12:09   CT ABDOMEN PELVIS W CONTRAST  Result Date: 04/22/2020 CLINICAL DATA:  74 year old female with history of abdominal pain. Suspected hernia. EXAM: CT ABDOMEN AND PELVIS WITH CONTRAST TECHNIQUE: Multidetector CT imaging of the abdomen and pelvis was performed using the standard protocol following bolus administration of intravenous contrast. CONTRAST:  128mL OMNIPAQUE IOHEXOL 300 MG/ML  SOLN COMPARISON:  CT the abdomen and pelvis 09/04/2015. FINDINGS: Lower chest: Atherosclerotic calcifications in the left anterior descending, left circumflex and right coronary arteries. Severe calcifications of the aortic valve. Mild scarring and pleural thickening in the lung bases bilaterally. Hepatobiliary: No suspicious cystic or solid hepatic lesions. No intra or extrahepatic biliary ductal dilatation. Status post cholecystectomy. Pancreas: No pancreatic mass. No pancreatic ductal dilatation. No pancreatic or peripancreatic fluid collections or inflammatory changes. Spleen: Unremarkable. Adrenals/Urinary Tract: Low-attenuation lesions in both kidneys measuring up to 1.4 cm in the lower pole of the right kidney, compatible with cysts. Small amount of parenchymal thinning in the posterior aspect of the interpolar region of the left kidney, presumably post infectious scarring. No suspicious renal lesions. No hydroureteronephrosis. Bilateral adrenal glands are normal in  appearance. Urinary bladder is unremarkable in appearance. Stomach/Bowel: Normal appearance of the stomach. Multiple dilated loops of mid to distal small bowel which measure up to 4.3 cm in diameter in the low anatomic pelvis where there is also some feculent contents in the small bowel, indicative of some small bowel stasis. Segment of mid to distal small bowel extends into a moderate to large infraumbilical ventral hernia. Postoperative  changes of what appears to be partial right hemicolectomy. Some gas and stool is noted throughout the colon and rectum. Vascular/Lymphatic: Aortic atherosclerosis, without evidence of aneurysm or dissection in the abdominal or pelvic vasculature. No lymphadenopathy noted in the abdomen or pelvis. Reproductive: Uterus and ovaries are unremarkable in appearance. Other: No significant volume of ascites.  No pneumoperitoneum. Musculoskeletal: There are no aggressive appearing lytic or blastic lesions noted in the visualized portions of the skeleton. Chronic appearing compression fractures of L1 and L3 are noted with 25% loss of anterior vertebral body height at both levels, similar to the prior study. IMPRESSION: 1. Findings are compatible with early or partial small bowel obstruction related to a moderate to large infraumbilical ventral hernia. Surgical consultation is suggested. 2. Aortic atherosclerosis, in addition to 3 vessel coronary artery disease. Please note that although the presence of coronary artery calcium documents the presence of coronary artery disease, the severity of this disease and any potential stenosis cannot be assessed on this non-gated CT examination. Assessment for potential risk factor modification, dietary therapy or pharmacologic therapy may be warranted, if clinically indicated. 3. There are calcifications of the aortic valve. Echocardiographic correlation for evaluation of potential valvular dysfunction may be warranted if clinically indicated. 4. Additional incidental findings, as above. Electronically Signed   By: Vinnie Langton M.D.   On: 04/22/2020 19:06   DG Abd Portable 1V-Small Bowel Obstruction Protocol-initial, 8 hr delay  Result Date: 04/23/2020 CLINICAL DATA:  74 year old female with small bowel obstruction EXAM: PORTABLE ABDOMEN - 1 VIEW COMPARISON:  Abdominal radiograph dated 04/23/2020. FINDINGS: Partially visualized enteric tube with tip in the right upper abdomen  likely in the distal stomach in the region of the gastric antrum. Oral contrast noted throughout the colon. No small bowel dilatation. No free air. Right upper quadrant cholecystectomy clips. Several surgical clips noted over the abdomen. Degenerative changes of the spine. No acute osseous pathology. IMPRESSION: Partially visualized enteric tube with tip in the distal stomach. No small bowel dilatation. Electronically Signed   By: Anner Crete M.D.   On: 04/23/2020 17:25   DG Abd Portable 1V-Small Bowel Protocol-Position Verification  Result Date: 04/23/2020 CLINICAL DATA:  NG tube placement EXAM: PORTABLE ABDOMEN - 1 VIEW COMPARISON:  CT 04/22/2020 FINDINGS: Interval placement of nasogastric tube which courses below the diaphragm and distal tip projecting near the midline of the mid abdomen likely at the junction of the second and third portions of the duodenum. No dilated loops of small bowel are appreciated. Moderate volume of stool throughout the colon. Excreted contrast within the urinary bladder. Mild wedge deformities of L1 and L3 as seen on prior CT. IMPRESSION: Interval placement of nasogastric tube with distal tip projecting near the midline of the mid abdomen likely at the junction of the second and third portions of the duodenum. Electronically Signed   By: Davina Poke D.O.   On: 04/23/2020 08:08     Labs:   Basic Metabolic Panel: Recent Labs  Lab 04/22/20 1150 04/22/20 1150 04/23/20 0500 04/23/20 0500 04/24/20 0131 04/25/20 0643  NA 141  --  141  --  145 144  K 3.5   < > 4.2   < > 4.4 3.7  CL 94*  --  99  --  99 104  CO2 34*  --  32  --  34* 31  GLUCOSE 123*  --  109*  --  105* 114*  BUN 15  --  12  --  16 19  CREATININE 1.21*  --  1.09*  --  1.10* 1.02*  CALCIUM 10.7*  --  9.9  --  10.9* 9.6  MG  --   --   --   --  2.4 1.9  PHOS  --   --   --   --  2.9 3.2   < > = values in this interval not displayed.   GFR Estimated Creatinine Clearance: 38.1 mL/min (A) (by  C-G formula based on SCr of 1.02 mg/dL (H)). Liver Function Tests: No results for input(s): AST, ALT, ALKPHOS, BILITOT, PROT, ALBUMIN in the last 168 hours. No results for input(s): LIPASE, AMYLASE in the last 168 hours. No results for input(s): AMMONIA in the last 168 hours. Coagulation profile No results for input(s): INR, PROTIME in the last 168 hours.  CBC: Recent Labs  Lab 04/22/20 1150 04/23/20 0500 04/24/20 0131 04/25/20 0643  WBC 11.3* 9.0 11.3* 8.8  HGB 13.3 12.7 14.0 11.2*  HCT 42.5 41.5 44.6 36.4  MCV 100.0 101.0* 100.7* 100.8*  PLT 258 153 225 174   Cardiac Enzymes: No results for input(s): CKTOTAL, CKMB, CKMBINDEX, TROPONINI in the last 168 hours. BNP: Invalid input(s): POCBNP CBG: No results for input(s): GLUCAP in the last 168 hours. D-Dimer No results for input(s): DDIMER in the last 72 hours. Hgb A1c No results for input(s): HGBA1C in the last 72 hours. Lipid Profile No results for input(s): CHOL, HDL, LDLCALC, TRIG, CHOLHDL, LDLDIRECT in the last 72 hours. Thyroid function studies No results for input(s): TSH, T4TOTAL, T3FREE, THYROIDAB in the last 72 hours.  Invalid input(s): FREET3 Anemia work up No results for input(s): VITAMINB12, FOLATE, FERRITIN, TIBC, IRON, RETICCTPCT in the last 72 hours. Microbiology Recent Results (from the past 240 hour(s))  SARS Coronavirus 2 by RT PCR     Status: None   Collection Time: 04/22/20  9:26 PM  Result Value Ref Range Status   SARS Coronavirus 2 NEGATIVE NEGATIVE Final    Comment: (NOTE) Result indicates the ABSENCE of SARS-CoV-2 RNA in the patient specimen.   The lowest concentration of SARS-CoV-2 viral copies this assay can detect in nasopharyngeal swab specimens is 500 copies / mL.  A negative result does not preclude SARS-CoV-2 infection and should not be used as the sole basis for patient management decisions. A negative result may occur with improper specimen collection / handling, submission of a  specimen other than nasopharyngeal swab, presence of viral mutation(s) within the areas targeted by this assay, and inadequate number of viral copies (<500 copies / mL) present.  Negative results must be combined with clinical observations, patient history, and epidemiological information.   The expected result is NEGATIVE.   Patient Fact Sheet:  BlogSelections.co.uk    Provider Fact Sheet:  https://lucas.com/    This test is not yet approved or cleared by the [place] FDA and  has been [place] orized for detection and/or diagnosis of SARS-CoV-2 by FDA under an Emergency Use Authorization (EUA).  This EUA will remain in effect (meaning this test can be used) for the duration of  the COVID-19 declaration under  Section 564(b)(1) of the Act, 21 U.S.C. section 360bbb-3(b)(1), unless the authorization is terminated or revoked sooner  Performed at [facility], [place] [address], [place], [place] [address]      Discharge Instructions:   Discharge Instructions     Diet - low sodium heart healthy   Complete by: As directed    Discharge instructions   Complete by: As directed    Follow-up with your primary care physician in 1 week.  Seek medical attention for worsening symptoms.  Continue oxygen at home.   Increase activity slowly   Complete by: As directed       Allergies as of 04/25/2020       Reactions   Ceftriaxone Anaphylaxis, Other (See Comments)   *ROCEPHIN*  "Blow up like a balloon"   Hydroxyzine Shortness Of Breath, Other (See Comments)   Pt states med make her light headed, get sob sxs   Doxycycline Nausea And Vomiting   Lexapro [escitalopram] Other (See Comments)   Pt states med make her dizzy        Medication List     STOP taking these medications    Livalo 2 MG Tabs Generic drug: Pitavastatin Calcium       TAKE these medications    ALPRAZolam 1 MG tablet Commonly known as: XANAX TAKE 1 TABLET  BY MOUTH THREE TIMES A DAY AS NEEDED What changed: when to take this   aspirin 325 MG EC tablet Take 1 tablet (325 mg total) by mouth every morning. What changed: when to take this   Atrovent HFA 17 MCG/ACT inhaler Generic drug: ipratropium TAKE 2 PUFFS BY MOUTH EVERY 4 HOURS AS NEEDED FOR WHEEZE What changed: See the new instructions.   butalbital-acetaminophen-caffeine 50-325-40 MG tablet Commonly known as: FIORICET TAKE 1 TABLET BY MOUTH EVERY 4 (FOUR) HOURS AS NEEDED FOR HEADACHE.   CALCIUM 600 PO Take 1 tablet by mouth daily.   cholecalciferol 1000 units tablet Commonly known as: VITAMIN D Take 1,000 Units by mouth daily.   diclofenac sodium 1 % Gel Commonly known as: VOLTAREN Apply 4 g topically as needed (for pain).   DRY EYES OP Apply 2 drops to eye daily as needed (dry eyes).   fluticasone 50 MCG/ACT nasal spray Commonly known as: FLONASE USE 2 SPRAYS INTO EACH NOSTRIL ONCE DAILY**REPEAT FOR 5 DAYS THEN STOP   furosemide 40 MG tablet Commonly known as: LASIX Take 1 tablet (40 mg total) by mouth 2 (two) times daily.   ipratropium-albuterol 0.5-2.5 (3) MG/3ML Soln Commonly known as: DUONEB Inhale 3 mLs into the lungs 2 (two) times daily as needed (for SOB).   Klor-Con M20 20 MEQ tablet Generic drug: potassium chloride SA TAKE 1 TABLET BY MOUTH EVERY DAY What changed: how much to take   lactulose (encephalopathy) 10 GM/15ML Soln Commonly known as: Enulose TAKE 45 MILLILITERS BY MOUTH 3 TIMES A DAY AS NEEDED FOR CONSTIPATION What changed:   how much to take  how to take this  when to take this   levocetirizine 5 MG tablet Commonly known as: XYZAL Take 1 tablet (5 mg total) by mouth every evening.   montelukast 10 MG tablet Commonly known as: SINGULAIR TAKE 1 TABLET BY MOUTH EVERYDAY AT BEDTIME What changed: See the new instructions.   nortriptyline 25 MG capsule Commonly known as: PAMELOR TAKE 2 CAPSULES BY MOUTH AT BEDTIME What changed: how  much to take   Omega 3 1200 MG Caps Take 1,200 mg by mouth every  morning.   ondansetron 8 MG tablet Commonly known as: ZOFRAN TAKE 1 TABLET BY MOUTH EVERY 8 HOURS AS NEEDED FOR NAUSEA What changed:   reasons to take this  additional instructions   OXYGEN Inhale 3 L into the lungs See admin instructions. Uses when needed during the day, uses continuous throughout the night   pantoprazole 40 MG tablet Commonly known as: PROTONIX TAKE 1 TABLET BY MOUTH EVERY DAY   polyethylene glycol 17 g packet Commonly known as: MIRALAX / GLYCOLAX Take 17 g by mouth daily as needed for severe constipation (if not improved with lactulose).   predniSONE 10 MG tablet Commonly known as: DELTASONE Take 3 tablets (30 mg total) by mouth daily for 3 days.   [name] 60 MG/ML Sosy injection Generic drug: denosumab TO BE ADMINISTERED IN PHYSICIAN'S OFFICE. INJECT ONE SYRINGE SUBCOUSLY ONCE EVERY 6 MONTHS. REFRIGERATE. USE WITHIN 14 DAYS ONCE AT ROOM TEMPERATURE. What changed: See the new instructions.   Symbicort 160-4.5 MCG/ACT inhaler Generic drug: budesonide-formoterol TAKE 2 PUFFS BY MOUTH TWICE A DAY IN TO THE LUNGS What changed: See the new instructions.   Ventolin HFA 108 (90 Base) MCG/ACT inhaler Generic drug: albuterol INHALE 2 PUFFS BY MOUTH EVERY 4 HOURS AS NEEDED What changed: reasons to take this   vitamin C 500 MG tablet Commonly known as: ASCORBIC ACID Take 500 mg by mouth daily.           Time coordinating discharge: 39 minutes  Signed:  Ester Hilley  [facility] 04/25/2020, 1:50 PM

## 2020-04-25 NOTE — Plan of Care (Signed)
  Problem: Health Behavior/Discharge Planning: Goal: Ability to manage health-related needs will improve Outcome: Progressing   

## 2020-04-25 NOTE — Plan of Care (Signed)
  Problem: Education: Goal: Knowledge of General Education information will improve Description Including pain rating scale, medication(s)/side effects and non-pharmacologic comfort measures Outcome: Progressing   

## 2020-05-06 ENCOUNTER — Encounter: Payer: Self-pay | Admitting: Internal Medicine

## 2020-05-06 ENCOUNTER — Ambulatory Visit (INDEPENDENT_AMBULATORY_CARE_PROVIDER_SITE_OTHER): Payer: Medicare Other | Admitting: Internal Medicine

## 2020-05-06 ENCOUNTER — Other Ambulatory Visit: Payer: Self-pay

## 2020-05-06 VITALS — BP 110/80 | HR 73 | Temp 97.8°F | Resp 20 | Ht 62.0 in | Wt 107.0 lb

## 2020-05-06 DIAGNOSIS — K5904 Chronic idiopathic constipation: Secondary | ICD-10-CM | POA: Diagnosis not present

## 2020-05-06 DIAGNOSIS — K439 Ventral hernia without obstruction or gangrene: Secondary | ICD-10-CM

## 2020-05-06 DIAGNOSIS — I2510 Atherosclerotic heart disease of native coronary artery without angina pectoris: Secondary | ICD-10-CM

## 2020-05-06 DIAGNOSIS — D539 Nutritional anemia, unspecified: Secondary | ICD-10-CM

## 2020-05-06 DIAGNOSIS — E785 Hyperlipidemia, unspecified: Secondary | ICD-10-CM | POA: Diagnosis not present

## 2020-05-06 DIAGNOSIS — M810 Age-related osteoporosis without current pathological fracture: Secondary | ICD-10-CM

## 2020-05-06 MED ORDER — DENOSUMAB 60 MG/ML ~~LOC~~ SOSY
60.0000 mg | PREFILLED_SYRINGE | Freq: Once | SUBCUTANEOUS | Status: AC
  Administered 2020-05-06: 60 mg via SUBCUTANEOUS

## 2020-05-06 MED ORDER — LINACLOTIDE 72 MCG PO CAPS: 72.0000 ug | ORAL_CAPSULE | Freq: Every day | ORAL | 0 refills | Status: DC

## 2020-05-06 NOTE — Patient Instructions (Signed)

## 2020-05-06 NOTE — Progress Notes (Signed)
Subjective:  Patient ID: [name], female    DOB: 10-08-1945  Age: 74 y.o. MRN: [id number]  CC: Anemia  This visit occurred during the SARS-CoV-2 public health emergency.  Safety protocols were in place, including screening questions prior to the visit, additional usage of staff PPE, and extensive cleaning of exam room while observing appropriate contact time as indicated for disinfecting solutions.    HPI [name] presents for hosp f/up -she was recently admitted for a small bowel obstruction and ventral abdominal hernia.  She was seen by a surgeon but she does not know what to do about the hernia.  She continues to complain of constipation.  She asked me if I would prescribe a suppository.  She has been used multiple over-the-counter remedies and still has difficulty with bowel movements.  Labs during the mission were remarkable for a mild anemia.  She is not aware of any sources of blood loss.  She has not recently developed fatigue or paresthesias.   Recommendations for Outpatient Follow-Up:    Follow up with your primary care provider in one week.   Check CBC, BMP, mg in the next visit.  Discharge Diagnosis:   Principal Problem:   Partial Small bowel obstruction (HCC) Active Problems:   Chronic diastolic CHF (congestive heart failure) (HCC)   Coronary artery disease involving native coronary artery of native heart without angina pectoris   COPD with acute exacerbation (HCC)   Chronic respiratory failure with hypoxia (HCC)   Anxiety   Chronic renal disease, stage 3, moderately decreased glomerular filtration rate (GFR) between 30-59 mL/min/1.73 square meter   Discharge    Outpatient Medications Prior to Visit  Medication Sig Dispense Refill  . ALPRAZolam (XANAX) 1 MG tablet TAKE 1 TABLET BY MOUTH THREE TIMES A DAY AS NEEDED (Patient taking differently: Take 1 mg by mouth 3 (three) times daily. ) 90 tablet 3  . Artificial Tear Ointment (DRY EYES OP) Apply  2 drops to eye daily as needed (dry eyes).     Marland Kitchen aspirin 325 MG EC tablet Take 1 tablet (325 mg total) by mouth every morning. (Patient taking differently: Take 325 mg by mouth daily. )    . ATROVENT HFA 17 MCG/ACT inhaler TAKE 2 PUFFS BY MOUTH EVERY 4 HOURS AS NEEDED FOR WHEEZE (Patient taking differently: Inhale 2 puffs into the lungs every 4 (four) hours as needed for wheezing. ) 77.4 Inhaler 0  . butalbital-acetaminophen-caffeine (FIORICET) 50-325-40 MG tablet TAKE 1 TABLET BY MOUTH EVERY 4 (FOUR) HOURS AS NEEDED FOR HEADACHE. 65 tablet 0  . Calcium Carbonate (CALCIUM 600 PO) Take 1 tablet by mouth daily.     . cholecalciferol (VITAMIN D) 1000 units tablet Take 1,000 Units by mouth daily.    . diclofenac sodium (VOLTAREN) 1 % GEL Apply 4 g topically as needed (for pain). 100 g 5  . fluticasone (FLONASE) 50 MCG/ACT nasal spray USE 2 SPRAYS INTO EACH NOSTRIL ONCE DAILY**REPEAT FOR 5 DAYS THEN STOP (Patient not taking: Reported on 04/22/2020) 16 g 11  . furosemide (LASIX) 40 MG tablet Take 1 tablet (40 mg total) by mouth 2 (two) times daily. 60 tablet 0  . ipratropium-albuterol (DUONEB) 0.5-2.5 (3) MG/3ML SOLN Inhale 3 mLs into the lungs 2 (two) times daily as needed (for SOB). 270 mL 1  . KLOR-CON M20 20 MEQ tablet TAKE 1 TABLET BY MOUTH EVERY DAY (Patient taking differently: Take 20 mEq by mouth daily. ) 90 tablet 0  . lactulose, encephalopathy, (  ENULOSE) 10 GM/15ML SOLN TAKE 45 MILLILITERS BY MOUTH 3 TIMES A DAY AS NEEDED FOR CONSTIPATION (Patient taking differently: Take 30 g by mouth See admin instructions. TAKE 45 MILLILITERS BY MOUTH 3 TIMES A DAY AS NEEDED FOR CONSTIPATION) 1892 mL 0  . levocetirizine (XYZAL) 5 MG tablet Take 1 tablet (5 mg total) by mouth every evening. 90 tablet 1  . montelukast (SINGULAIR) 10 MG tablet TAKE 1 TABLET BY MOUTH EVERYDAY AT BEDTIME (Patient taking differently: Take 10 mg by mouth at bedtime. ) 90 tablet 1  . nortriptyline (PAMELOR) 25 MG capsule TAKE 2 CAPSULES  BY MOUTH AT BEDTIME (Patient taking differently: Take 25 mg by mouth at bedtime. ) 180 capsule 0  . Omega 3 1200 MG CAPS Take 1,200 mg by mouth every morning.     . ondansetron (ZOFRAN) 8 MG tablet TAKE 1 TABLET BY MOUTH EVERY 8 HOURS AS NEEDED FOR NAUSEA (Patient taking differently: Take 8 mg by mouth every 8 (eight) hours as needed for nausea. ) 30 tablet 2  . OXYGEN-HELIUM IN Inhale 3 L into the lungs See admin instructions. Uses when needed during the day, uses continuous throughout the night    . pantoprazole (PROTONIX) 40 MG tablet TAKE 1 TABLET BY MOUTH EVERY DAY (Patient taking differently: Take 40 mg by mouth daily. ) 90 tablet 1  . polyethylene glycol (MIRALAX / GLYCOLAX) 17 g packet Take 17 g by mouth daily as needed for severe constipation (if not improved with lactulose). 14 each 0  . [name] 60 MG/ML SOSY injection TO BE ADMINISTERED IN PHYSICIAN'S OFFICE. INJECT ONE SYRINGE SUBCOUSLY ONCE EVERY 6 MONTHS. REFRIGERATE. USE WITHIN 14 DAYS ONCE AT ROOM TEMPERATURE. (Patient taking differently: Inject 60 mg into the skin every 6 (six) months. ) 1 mL 1  . SYMBICORT 160-4.5 MCG/ACT inhaler TAKE 2 PUFFS BY MOUTH TWICE A DAY IN TO THE LUNGS (Patient taking differently: Inhale 2 puffs into the lungs in the morning and at bedtime. ) 30.6 Inhaler 0  . VENTOLIN HFA 108 (90 Base) MCG/ACT inhaler INHALE 2 PUFFS BY MOUTH EVERY 4 HOURS AS NEEDED (Patient taking differently: Inhale 2 puffs into the lungs every 4 (four) hours as needed for wheezing or shortness of breath. ) 36 g 2  . vitamin C (ASCORBIC ACID) 500 MG tablet Take 500 mg by mouth daily.    Marland Kitchen LIVALO 2 MG TABS TAKE 1 TABLET BY MOUTH EVERY DAY 90 tablet 1   No facility-administered medications prior to visit.    ROS Review of Systems  Constitutional: Negative.  Negative for chills, diaphoresis, fatigue and fever.  HENT: Negative.   Eyes: Negative.   Respiratory: Positive for shortness of breath. Negative for cough, chest tightness and  wheezing.   Gastrointestinal: Positive for constipation. Negative for abdominal pain, blood in stool, diarrhea, nausea and vomiting.  Endocrine: Negative.   Genitourinary: Negative for decreased urine volume, difficulty urinating, dysuria, hematuria and urgency.  Musculoskeletal: Negative for arthralgias, back pain, myalgias and neck pain.  Skin: Negative for color change, pallor and rash.  Neurological: Negative.  Negative for dizziness, weakness, light-headedness and numbness.  Hematological: Negative for adenopathy. Does not bruise/bleed easily.  Psychiatric/Behavioral: Negative.     Objective:  BP 110/80 (BP Location: Left Arm, Patient Position: Sitting, Cuff Size: Normal)   Pulse 73   Temp 97.8 F (36.6 C) (Oral)   Resp 20   Ht 5\' 2"  (1.575 m)   Wt 107 lb (48.5 kg)   SpO2 98%  BMI 19.57 kg/m   BP Readings from Last 3 Encounters:  05/06/20 110/80  04/25/20 (!) 138/64  12/14/19 128/78    Wt Readings from Last 3 Encounters:  05/06/20 107 lb (48.5 kg)  04/23/20 110 lb 0.2 oz (49.9 kg)  12/14/19 119 lb 12.8 oz (54.3 kg)    Physical Exam Constitutional:      General: She is not in acute distress.    Appearance: She is ill-appearing (O2 dependent). She is not toxic-appearing or diaphoretic.  HENT:     Nose: Nose normal.     Mouth/Throat:     Mouth: Mucous membranes are moist.  Eyes:     General: No scleral icterus.    Conjunctiva/sclera: Conjunctivae normal.  Cardiovascular:     Rate and Rhythm: Normal rate and regular rhythm.     Heart sounds: No murmur heard.   Pulmonary:     Effort: Tachypnea and accessory muscle usage present.     Breath sounds: No stridor or decreased air movement. Examination of the right-upper field reveals decreased breath sounds. Examination of the left-upper field reveals decreased breath sounds. Examination of the right-middle field reveals decreased breath sounds. Examination of the left-middle field reveals decreased breath sounds.  Examination of the right-lower field reveals decreased breath sounds. Examination of the left-lower field reveals decreased breath sounds. Decreased breath sounds and rhonchi present. No wheezing or rales.     Comments: Exp rhonchi b/l Abdominal:     General: Abdomen is protuberant. There is no distension.     Palpations: Abdomen is soft. There is no hepatomegaly, splenomegaly or mass.     Tenderness: There is no abdominal tenderness.    Musculoskeletal:        General: Normal range of motion.     Cervical back: Neck supple.     Right lower leg: No edema.     Left lower leg: No edema.  Lymphadenopathy:     Cervical: No cervical adenopathy.  Skin:    General: Skin is warm and dry.     Coloration: Skin is not jaundiced.     Findings: No rash.  Neurological:     General: No focal deficit present.     Mental Status: She is alert and oriented to person, place, and time. Mental status is at baseline.  Psychiatric:        Behavior: Behavior is cooperative.     Lab Results  Component Value Date   WBC 8.9 05/06/2020   HGB 12.9 05/06/2020   HCT 39.3 05/06/2020   PLT 298 05/06/2020   GLUCOSE 114 (H) 04/25/2020   CHOL 214 (H) 05/06/2020   TRIG 114 05/06/2020   HDL 76 05/06/2020   LDLCALC 116 (H) 05/06/2020   ALT 18 03/19/2019   AST 28 03/19/2019   NA 144 04/25/2020   K 3.7 04/25/2020   CL 104 04/25/2020   CREATININE 1.02 (H) 04/25/2020   BUN 19 04/25/2020   CO2 31 04/25/2020   TSH 1.76 05/06/2020   INR 0.87 09/05/2013   HGBA1C 5.3 10/16/2015    DG Chest 2 View  Result Date: 04/22/2020 CLINICAL DATA:  Chest pain. EXAM: CHEST - 2 VIEW COMPARISON:  Prior chest radiographs 09/11/2018 and earlier FINDINGS: Unchanged mild cardiomegaly. Aortic atherosclerosis. No appreciable airspace consolidation or pulmonary edema. No sizable pleural effusion or evidence of pneumothorax. No acute bony abnormality identified. Multiple metallic clips project in the region of the right axilla.  Additionally, surgical clips are present within the upper abdomen. IMPRESSION: No  evidence of acute cardiopulmonary abnormality. Unchanged mild cardiomegaly. Aortic Atherosclerosis (ICD10-I70.0). Electronically Signed   By: Kellie Simmering DO   On: 04/22/2020 12:09   CT ABDOMEN PELVIS W CONTRAST  Result Date: 04/22/2020 CLINICAL DATA:  74 year old female with history of abdominal pain. Suspected hernia. EXAM: CT ABDOMEN AND PELVIS WITH CONTRAST TECHNIQUE: Multidetector CT imaging of the abdomen and pelvis was performed using the standard protocol following bolus administration of intravenous contrast. CONTRAST:  160mL OMNIPAQUE IOHEXOL 300 MG/ML  SOLN COMPARISON:  CT the abdomen and pelvis 09/04/2015. FINDINGS: Lower chest: Atherosclerotic calcifications in the left anterior descending, left circumflex and right coronary arteries. Severe calcifications of the aortic valve. Mild scarring and pleural thickening in the lung bases bilaterally. Hepatobiliary: No suspicious cystic or solid hepatic lesions. No intra or extrahepatic biliary ductal dilatation. Status post cholecystectomy. Pancreas: No pancreatic mass. No pancreatic ductal dilatation. No pancreatic or peripancreatic fluid collections or inflammatory changes. Spleen: Unremarkable. Adrenals/Urinary Tract: Low-attenuation lesions in both kidneys measuring up to 1.4 cm in the lower pole of the right kidney, compatible with cysts. Small amount of parenchymal thinning in the posterior aspect of the interpolar region of the left kidney, presumably post infectious scarring. No suspicious renal lesions. No hydroureteronephrosis. Bilateral adrenal glands are normal in appearance. Urinary bladder is unremarkable in appearance. Stomach/Bowel: Normal appearance of the stomach. Multiple dilated loops of mid to distal small bowel which measure up to 4.3 cm in diameter in the low anatomic pelvis where there is also some feculent contents in the small bowel, indicative of  some small bowel stasis. Segment of mid to distal small bowel extends into a moderate to large infraumbilical ventral hernia. Postoperative changes of what appears to be partial right hemicolectomy. Some gas and stool is noted throughout the colon and rectum. Vascular/Lymphatic: Aortic atherosclerosis, without evidence of aneurysm or dissection in the abdominal or pelvic vasculature. No lymphadenopathy noted in the abdomen or pelvis. Reproductive: Uterus and ovaries are unremarkable in appearance. Other: No significant volume of ascites.  No pneumoperitoneum. Musculoskeletal: There are no aggressive appearing lytic or blastic lesions noted in the visualized portions of the skeleton. Chronic appearing compression fractures of L1 and L3 are noted with 25% loss of anterior vertebral body height at both levels, similar to the prior study. IMPRESSION: 1. Findings are compatible with early or partial small bowel obstruction related to a moderate to large infraumbilical ventral hernia. Surgical consultation is suggested. 2. Aortic atherosclerosis, in addition to 3 vessel coronary artery disease. Please note that although the presence of coronary artery calcium documents the presence of coronary artery disease, the severity of this disease and any potential stenosis cannot be assessed on this non-gated CT examination. Assessment for potential risk factor modification, dietary therapy or pharmacologic therapy may be warranted, if clinically indicated. 3. There are calcifications of the aortic valve. Echocardiographic correlation for evaluation of potential valvular dysfunction may be warranted if clinically indicated. 4. Additional incidental findings, as above. Electronically Signed   By: Vinnie Langton M.D.   On: 04/22/2020 19:06   DG Abd Portable 1V-Small Bowel Obstruction Protocol-initial, 8 hr delay  Result Date: 04/23/2020 CLINICAL DATA:  74 year old female with small bowel obstruction EXAM: PORTABLE ABDOMEN - 1  VIEW COMPARISON:  Abdominal radiograph dated 04/23/2020. FINDINGS: Partially visualized enteric tube with tip in the right upper abdomen likely in the distal stomach in the region of the gastric antrum. Oral contrast noted throughout the colon. No small bowel dilatation. No free air. Right upper quadrant cholecystectomy  clips. Several surgical clips noted over the abdomen. Degenerative changes of the spine. No acute osseous pathology. IMPRESSION: Partially visualized enteric tube with tip in the distal stomach. No small bowel dilatation. Electronically Signed   By: Anner Crete M.D.   On: 04/23/2020 17:25   DG Abd Portable 1V-Small Bowel Protocol-Position Verification  Result Date: 04/23/2020 CLINICAL DATA:  NG tube placement EXAM: PORTABLE ABDOMEN - 1 VIEW COMPARISON:  CT 04/22/2020 FINDINGS: Interval placement of nasogastric tube which courses below the diaphragm and distal tip projecting near the midline of the mid abdomen likely at the junction of the second and third portions of the duodenum. No dilated loops of small bowel are appreciated. Moderate volume of stool throughout the colon. Excreted contrast within the urinary bladder. Mild wedge deformities of L1 and L3 as seen on prior CT. IMPRESSION: Interval placement of nasogastric tube with distal tip projecting near the midline of the mid abdomen likely at the junction of the second and third portions of the duodenum. Electronically Signed   By: Davina Poke D.O.   On: 04/23/2020 08:08    Assessment & Plan:   Clessie was seen today for anemia.  Diagnoses and all orders for this visit:  Hernia of anterior abdominal wall- I recommended that she follow-up with general surgery regarding this. -     Ambulatory referral to General Surgery  Hyperlipidemia LDL goal <100- She is not compliant with the statin and has not achieved her LDL goal.  I have asked her to restart pitavastatin. -     Lipid panel; Future -     TSH; Future -     TSH -      Lipid panel -     Pitavastatin Calcium (LIVALO) 2 MG TABS; Take 1 tablet (2 mg total) by mouth daily.  Chronic idiopathic constipation- Labs are negative for secondary causes.  I recommended that she add linaclotide to her current regimen. -     TSH; Future -     linaclotide (LINZESS) 72 MCG capsule; Take 1 capsule (72 mcg total) by mouth daily before breakfast. -     TSH  Deficiency anemia- She is no longer anemic and her vitamin levels are normal. -     CBC with Differential/Platelet; Future -     Ferritin; Future -     Folate; Future -     Vitamin B1; Future -     Iron; Future -     Vitamin B12; Future -     Vitamin B12 -     Iron -     Vitamin B1 -     Folate -     Ferritin -     CBC with Differential/Platelet  Osteoporosis, unspecified osteoporosis type, unspecified pathological fracture presence -     denosumab ([name]) injection 60 mg  Hyperlipidemia  Coronary artery disease involving native coronary artery of native heart without angina pectoris- She has had no recent episodes of angina.  Will continue to work on risk factor modifications. -     Pitavastatin Calcium (LIVALO) 2 MG TABS; Take 1 tablet (2 mg total) by mouth daily.  Other orders -     Extra Specimen   I have changed [name] "Hailynn Bey"'s Livalo. I am also having her start on linaclotide. Additionally, I am having her maintain her OXYGEN-HELIUM IN, Omega 3, aspirin, cholecalciferol, Artificial Tear Ointment (DRY EYES OP), vitamin C, fluticasone, Calcium Carbonate (CALCIUM 600 PO), ondansetron, diclofenac sodium, ipratropium-albuterol, levocetirizine, [name],  montelukast, ALPRAZolam, Ventolin HFA, pantoprazole, Symbicort, Atrovent HFA, furosemide, Klor-Con M20, butalbital-acetaminophen-caffeine, lactulose (encephalopathy), nortriptyline, and polyethylene glycol. We administered denosumab.  Meds ordered this encounter  Medications  . linaclotide (LINZESS) 72 MCG capsule    Sig: Take 1 capsule  (72 mcg total) by mouth daily before breakfast.    Dispense:  80 capsule    Refill:  0  . denosumab ([name]) injection 60 mg  . Pitavastatin Calcium (LIVALO) 2 MG TABS    Sig: Take 1 tablet (2 mg total) by mouth daily.    Dispense:  90 tablet    Refill:  1     Follow-up: Return in about 3 months (around 08/06/2020).  Scarlette Calico, MD

## 2020-05-10 LAB — FOLATE: Folate: >24 ng/mL

## 2020-05-10 LAB — CBC WITH DIFFERENTIAL/PLATELET
Absolute Monocytes: 641 cells/uL (ref 200–950)
Basophils Absolute: 89 cells/uL (ref 0–200)
Basophils Relative: 1 %
Eosinophils Absolute: 160 cells/uL (ref 15–500)
Eosinophils Relative: 1.8 %
HCT: 39.3 % (ref 35.0–45.0)
Hemoglobin: 12.9 g/dL (ref 11.7–15.5)
Lymphs Abs: 1798 cells/uL (ref 850–3900)
MCH: 31.3 pg (ref 27.0–33.0)
MCHC: 32.8 g/dL (ref 32.0–36.0)
MCV: 95.4 fL (ref 80.0–100.0)
MPV: 9.4 fL (ref 7.5–12.5)
Monocytes Relative: 7.2 %
Neutro Abs: 6212 cells/uL (ref 1500–7800)
Neutrophils Relative %: 69.8 %
Platelets: 298 10*3/uL (ref 140–400)
RBC: 4.12 10*6/uL (ref 3.80–5.10)
RDW: 12.4 % (ref 11.0–15.0)
Total Lymphocyte: 20.2 %
WBC: 8.9 10*3/uL (ref 3.8–10.8)

## 2020-05-10 LAB — EXTRA SPECIMEN

## 2020-05-10 LAB — IRON: Iron: 73 ug/dL (ref 45–160)

## 2020-05-10 LAB — VITAMIN B1

## 2020-05-10 LAB — LIPID PANEL
Cholesterol: 214 mg/dL — ABNORMAL HIGH (ref <200)
HDL: 76 mg/dL (ref >=50)
LDL Cholesterol (Calc): 116 mg/dL (calc) — ABNORMAL HIGH
Non-HDL Cholesterol (Calc): 138 mg/dL (calc) — ABNORMAL HIGH (ref <130)
Total CHOL/HDL Ratio: 2.8 (calc) (ref <5.0)
Triglycerides: 114 mg/dL (ref <150)

## 2020-05-10 LAB — FERRITIN: Ferritin: 41 ng/mL (ref 16–288)

## 2020-05-10 LAB — VITAMIN B12: Vitamin B-12: 430 pg/mL (ref 200–1100)

## 2020-05-10 LAB — TSH: TSH: 1.76 mIU/L (ref 0.40–4.50)

## 2020-05-11 MED ORDER — LIVALO 2 MG PO TABS: 1.0000 | ORAL_TABLET | Freq: Every day | ORAL | 1 refills | Status: DC

## 2020-05-15 ENCOUNTER — Other Ambulatory Visit: Payer: Self-pay | Admitting: Internal Medicine

## 2020-05-15 DIAGNOSIS — J301 Allergic rhinitis due to pollen: Secondary | ICD-10-CM

## 2020-05-26 ENCOUNTER — Other Ambulatory Visit: Payer: Self-pay | Admitting: Internal Medicine

## 2020-05-26 DIAGNOSIS — J449 Chronic obstructive pulmonary disease, unspecified: Secondary | ICD-10-CM

## 2020-06-01 ENCOUNTER — Other Ambulatory Visit: Payer: Self-pay | Admitting: Internal Medicine

## 2020-06-01 DIAGNOSIS — J42 Unspecified chronic bronchitis: Secondary | ICD-10-CM

## 2020-06-01 DIAGNOSIS — J438 Other emphysema: Secondary | ICD-10-CM

## 2020-06-12 ENCOUNTER — Other Ambulatory Visit: Payer: Self-pay | Admitting: Internal Medicine

## 2020-06-15 ENCOUNTER — Other Ambulatory Visit: Payer: Self-pay | Admitting: Internal Medicine

## 2020-06-15 DIAGNOSIS — F419 Anxiety disorder, unspecified: Secondary | ICD-10-CM

## 2020-06-16 ENCOUNTER — Other Ambulatory Visit: Payer: Self-pay | Admitting: Internal Medicine

## 2020-06-16 DIAGNOSIS — J301 Allergic rhinitis due to pollen: Secondary | ICD-10-CM

## 2020-07-08 ENCOUNTER — Other Ambulatory Visit: Payer: Self-pay | Admitting: Internal Medicine

## 2020-07-08 DIAGNOSIS — F5105 Insomnia due to other mental disorder: Secondary | ICD-10-CM

## 2020-07-08 DIAGNOSIS — F409 Phobic anxiety disorder, unspecified: Secondary | ICD-10-CM

## 2020-07-12 ENCOUNTER — Other Ambulatory Visit: Payer: Self-pay | Admitting: Internal Medicine

## 2020-07-12 DIAGNOSIS — I5032 Chronic diastolic (congestive) heart failure: Secondary | ICD-10-CM

## 2020-07-15 ENCOUNTER — Other Ambulatory Visit: Payer: Self-pay | Admitting: Internal Medicine

## 2020-07-15 DIAGNOSIS — F419 Anxiety disorder, unspecified: Secondary | ICD-10-CM

## 2020-07-15 DIAGNOSIS — J42 Unspecified chronic bronchitis: Secondary | ICD-10-CM

## 2020-07-15 DIAGNOSIS — J438 Other emphysema: Secondary | ICD-10-CM

## 2020-07-25 ENCOUNTER — Telehealth: Payer: Self-pay | Admitting: Internal Medicine

## 2020-07-25 ENCOUNTER — Other Ambulatory Visit: Payer: Self-pay | Admitting: Internal Medicine

## 2020-07-25 DIAGNOSIS — K7290 Hepatic failure, unspecified without coma: Secondary | ICD-10-CM

## 2020-07-25 DIAGNOSIS — K7682 Hepatic encephalopathy: Secondary | ICD-10-CM

## 2020-07-25 NOTE — Telephone Encounter (Signed)
Patient had samples for the linzess and would like a prescription sent in for her  [id number] - [place], [place] - [address] Phone:  [phone]  Fax:  [phone]

## 2020-07-28 ENCOUNTER — Other Ambulatory Visit: Payer: Self-pay | Admitting: Internal Medicine

## 2020-07-28 DIAGNOSIS — K5904 Chronic idiopathic constipation: Secondary | ICD-10-CM

## 2020-07-28 MED ORDER — LINACLOTIDE 72 MCG PO CAPS: 72.0000 ug | ORAL_CAPSULE | Freq: Every day | ORAL | 1 refills | Status: DC

## 2020-07-29 ENCOUNTER — Encounter: Payer: Self-pay | Admitting: Internal Medicine

## 2020-08-05 ENCOUNTER — Encounter: Payer: Self-pay | Admitting: Internal Medicine

## 2020-08-05 NOTE — Telephone Encounter (Signed)
She will be due for her next [name] injection on or after February 11th.

## 2020-08-07 ENCOUNTER — Ambulatory Visit: Payer: Medicare Other | Admitting: Internal Medicine

## 2020-08-19 ENCOUNTER — Ambulatory Visit: Payer: Medicare Other | Admitting: Internal Medicine

## 2020-08-26 ENCOUNTER — Other Ambulatory Visit: Payer: Self-pay

## 2020-08-26 ENCOUNTER — Ambulatory Visit (INDEPENDENT_AMBULATORY_CARE_PROVIDER_SITE_OTHER): Payer: Medicare Other | Admitting: Internal Medicine

## 2020-08-26 ENCOUNTER — Encounter: Payer: Self-pay | Admitting: Internal Medicine

## 2020-08-26 VITALS — BP 100/63 | HR 71 | Temp 95.9°F | Ht 62.0 in | Wt 113.2 lb

## 2020-08-26 DIAGNOSIS — I350 Nonrheumatic aortic (valve) stenosis: Secondary | ICD-10-CM | POA: Diagnosis not present

## 2020-08-26 DIAGNOSIS — J449 Chronic obstructive pulmonary disease, unspecified: Secondary | ICD-10-CM | POA: Diagnosis not present

## 2020-08-26 DIAGNOSIS — E785 Hyperlipidemia, unspecified: Secondary | ICD-10-CM

## 2020-08-26 DIAGNOSIS — I2510 Atherosclerotic heart disease of native coronary artery without angina pectoris: Secondary | ICD-10-CM | POA: Diagnosis not present

## 2020-08-26 DIAGNOSIS — I10 Essential (primary) hypertension: Secondary | ICD-10-CM

## 2020-08-26 NOTE — Patient Instructions (Signed)

## 2020-08-26 NOTE — Progress Notes (Signed)
OFFICE NOTE  Chief Complaint:  No complaints  Primary Care Physician: [name], MD  HPI:  [name] a pleasant 74 year old female with a history of COPD and severe pulmonary hypertension. Recently she was put on home oxygen and has been on an oxygen concentrator, which has made a marked improvement in her ability to get around. She has previously seen Dr. Lamonte Sakai with pulmonology; however, that did not go too well and she has basically given up on any further treatments for COPD other than her oxygen. She still uses nebulizers as needed when she is tight, and I recommended Claritin for seasonal allergies today. As you know, she also has mild aortic stenosis and we are continuing to follow that. She is a low-risk stress test in November 2012. Unfortunately she was recently admitted for altered mental status, possible Tylenol overdose, respiratory failure and liver failure. All of which seems to have resolved. She seems to be doing pre-well after discharge.  She returns today for followup. She reports doing fairly well. She has seen Dr. Lake Bells in the interim and he is recommended continue current therapies. She is now seeing a different internist, Dr. Ronnald Ramp. He was recently admitted the hospital for pneumonia and treated accordingly. She had troponins which were negative suggesting they're likely is no significant underlying obstructive coronary disease. She reports she's recovered and is back to her base. She is overdue for repeat lipid profile.  I saw [name] back in the office today. Overall she reports she is doing fairly well. She's not been admitted to the hospital recently. She says she has stable shortness of breath. She denies any chest pain. It should be noted that she does have mild aortic stenosis which will last assessed in 2014.  [name] returns today for follow-up. She reports that she is doing fairly well. Fortune she's not been hospitalized. She has had an  episode of COPD exacerbation which was supported with early antibiotics. She denies any worsening chest pain and has pretty stable shortness of breath on oxygen. She is able to ambulate and do most of her activities without any restrictions.  03/31/2016  [name] was seen back today in the office for follow-up. She seems to be doing fairly well although does use her oxygen almost continuously. Saturation was low today although she had heavy fingernail polish on and therefore the reading may not be accurate as it read 88%. She was on oxygen via a concentrator. Blood pressure was stable at 108/74. Weight is been stable. She recently had a repeat echocardiogram which shows stable mild aortic stenosis and normal LV function with some mild improvement in pulmonary pressures in May. She denies any chest pain. Fortune that she's not been admitted to the hospital recently for any pneumonia but did have problems with recent gallbladder disease and had cholecystectomy.  12/17/2016  [name] returns today for follow-up. She's done well over the last year has not been hospitalized. She reports stable shortness of breath. She had mild aortic stenosis which is stable by echo last year and will need to be repeated in May of this year. Her last lipid profile showed total cholesterol 197, HDL C 78, triglycerides 100, an LDL-C 99. We'll need to repeat her lipid profile as well.  06/20/2017  I saw [name] back today for follow-up. She is doing well. We repeated on echo in 01/2017, this demonstrated normal LVEF 60-65%, mild stable aortic stenosis. She denies any worsening shortness of breath or chest  pain. Cholesterol remains elevated. Will plan to recheck that today. She could conceivably increase her Livalo. She reports having had her flu vaccine this year.  08/16/2017  [name] returns today for follow-up.  She seen specifically for oxygen testing.  We tested her room air saturation off of oxygen and it was noted to be 88% at rest.   We did not do ambulatory testing as she met criteria for oxygen therapy.  She is currently on an oxygen concentrator which we wish to renew.  She needs the oxygen concentrator as she is active and is mobile but requires continuous oxygen therapy -she is on 3 L continuous by nasal cannula.  12/09/2017  [name] returns today for follow-up.  She reports doing fairly well.  She is pleased that she finally got her oxygen concentrator.  Blood pressure is well-controlled today 122/72.  She denies chest pain or worsening shortness of breath.  Should her weight is been stable.  She had a recent lipid profile in December which showed LDL cholesterol of 81 and non-HDL cholesterol of 98.  She does have mild aortic valve stenosis which was assessed by echo last year.  07/03/2018   [name] returns today for follow-up.  Overall she feels like her breathing is done well.  She has not been hospitalized for pulmonary issues.  She denies chest pain or significant shortness of breath.  She continues to use oxygen.  She reports some improvement in her headaches.  Blood pressure remains soft but does not seem to be bothersome for her.  Her last lipid profile shows good control of her cholesterol.  She remains on Livalo.  Echo in 2018 showed mild aortic stenosis.  12/14/2019  [name] is seen today in follow-up.  She continues to be fairly stable from a cardiac standpoint.  She denies any chest pain or shortness of breath.  Her COPD seems to be stable on oxygen.  Blood pressure today is well controlled at 128/78.  Is now been a couple years since her last echo which did show some mild aortic stenosis.  She also has moderate coronary artery disease but does not seem to have had any issues with it.  EKG today shows sinus rhythm at 71. Her most recent lipids were TC 176, TG 92, HDL 76 and LDL 81.  08/26/2020  [name] returns today for follow-up.  So far her COPD seems to be fairly stable on oxygen.  She had a repeat echo last April which  showed stable LVEF of 55 to 60% with normal strain and grade 1 diastolic dysfunction.  Although there was mildly elevated pulmonary pressure the RV systolic function was normal.  There is mild to moderate aortic stenosis which seems fairly stable with mean gradient of 15 and peak gradient 29 mmHg.  Symptomatically she says she is about the same.  She continues to smoke some.  She had an issue with bowel impaction and now is on Linzess.  PMHx:  Past Medical History:  Diagnosis Date  . Abnormal LFTs 08/02/2011  . Acute liver failure 06/19/2013  . Acute renal failure ([place]) 06/19/2013  . Acute respiratory failure ([place]) 06/19/2013  . Altered mental status 08/01/2011  . Angina   . Anxiety   . Anxiety state, unspecified 12/03/2013  . Aortic stenosis    mild  . Arthritis    "hands" (02/03/2017)  . Breast cancer, right breast ([place]) dx'd 2008  . CAD (coronary artery disease) of artery bypass graft 11/06/2013  . CAD (coronary artery disease),  MI R/O 08/01/2011   PCI in 2006 (bare metal stent, unknown artery) - [place]   . CAP (community acquired pneumonia) 09/11/2018  . CHF,  acute diastolic, BNP 4k on admissio 08/01/2011  . Chronic bronchitis ([place])   . Chronic respiratory failure ([place]) 02/02/2012  . Compression fracture of L1 lumbar vertebra ([place]) 02/02/2012  . COPD (chronic obstructive pulmonary disease) ([facility])    oxygen-dependent 4LPM [place]  . Coronary artery disease 2006   2 stents w/previous MI  . Depressed   . Diastolic CHF ([place])   . Dyslipidemia   . Family history of adverse reaction to anesthesia    "daughter had c-section; missed twice w/epidural"  . GERD (gastroesophageal reflux disease)   . Heart murmur   . History of blood transfusion    "w/my colon OR"  . 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 History of nuclear stress test 08/03/2011   attenuation at apex - no perfusion defects   . HTN (hypertension) 11/06/2013  . Hyperkalemia, on ACE prior to admission 11/11/2011  . Hypertension   .  Hyponatremia 01/31/2012  . Migraine    "qod to q couple months since I was 21" (02/03/2017)  . Mild aortic stenosis 08/01/2011   AVA 1.69 cm2 (06/02/2011)   . Moderate to severe pulmonary hypertension ([place])   . NSTEMI (non-ST elevated myocardial infarction) ([place]) 2006  . NSVT (nonsustained ventricular tachycardia) ([facility])    h/o  . On home oxygen therapy    "3L just at night; have it available prn duringtheday" (09/11/2018)  . Pneumonia    "alot of times" (02/03/2017)  . SBO (small bowel obstruction) ([place]) 04/23/2020   PARTIAL     Past Surgical History:  Procedure Laterality Date  . BREAST BIOPSY Right 2008  . BREAST LUMPECTOMY Right 2008   malignant  . CATARACT EXTRACTION W/ INTRAOCULAR LENS  IMPLANT, BILATERAL Bilateral ~ 2013  . [place]; 1971; 1978  . CHOLECYSTECTOMY OPEN  2011  . COLON SURGERY    . COLOSTOMY  11/2007  . COLOSTOMY CLOSURE  07/2008  . CORONARY ANGIOPLASTY WITH STENT PLACEMENT  2006   "2 stents"  . ERCP N/A 09/05/2015   Procedure: ENDOSCOPIC RETROGRADE CHOLANGIOPANCREATOGRAPHY (ERCP);  Surgeon: Ladene Artist, MD;  Location: [facility];  Service: Endoscopy;  Laterality: N/A;  . PARTIAL COLECTOMY  2009   for obstruction: temporary ostomy, later reversed.   Marland Kitchen RIGHT HEART CATHETERIZATION N/A 09/05/2013   Procedure: RIGHT HEART CATH;  Surgeon: Jolaine Artist, MD;  Location: [facility] CATH LAB;  Service: Cardiovascular;  Laterality: N/A;  . TRANSTHORACIC ECHOCARDIOGRAM  11/12/2011   EF 58-09%, normal systolic function, grade 1 diastolic dysfunction; ventricular septal flattening (D-sign); mild AS; trace-mild MR; LA mildly dilated; RV mod dilated; RA mod dilated; severe pulm HTN; elevated CVP    FAMHx:  Family History  Problem Relation Age of Onset  . Schizophrenia Sister   . Alzheimer's disease Mother   . Hyperlipidemia Brother   . Heart disease Sister   . Diabetes Sister   . Cancer Neg Hx   . Stroke Neg Hx   . COPD Neg Hx   . Depression Neg Hx   .  Drug abuse Neg Hx   . Early death Neg Hx   . Hypertension Neg Hx   . Kidney disease Neg Hx     SOCHx:   reports that she has been smoking cigarettes. She has a 26.50 pack-year smoking history. She has never used smokeless tobacco. She reports  current alcohol use of about 1.0 standard drink of alcohol per week. She reports that she does not use drugs.  ALLERGIES:  Allergies  Allergen Reactions  . Ceftriaxone Anaphylaxis and Other (See Comments)    *ROCEPHIN*  "Blow up like a balloon"  . Hydroxyzine Shortness Of Breath and Other (See Comments)    Pt states med make her light headed, get sob sxs  . Doxycycline Nausea And Vomiting  . Lexapro [Escitalopram] Other (See Comments)    Pt states med make her dizzy    ROS: Pertinent items noted in HPI and remainder of comprehensive ROS otherwise negative.  HOME MEDS: Current Outpatient Medications  Medication Sig Dispense Refill  . albuterol (VENTOLIN HFA) 108 (90 Base) MCG/ACT inhaler Inhale 2 puffs into the lungs every 4 (four) hours as needed for wheezing or shortness of breath. 18 g 5  . ALPRAZolam (XANAX) 1 MG tablet TAKE 1 TABLET BY MOUTH THREE TIMES A DAY AS NEEDED 90 tablet 5  . Artificial Tear Ointment (DRY EYES OP) Apply 2 drops to eye daily as needed (dry eyes).     Marland Kitchen aspirin 325 MG EC tablet Take 1 tablet (325 mg total) by mouth every morning. (Patient taking differently: Take 325 mg by mouth daily. )    . ATROVENT HFA 17 MCG/ACT inhaler INHALE 2 PUFFS BY MOUTH EVERY 4 HOURS AS NEEDED FOR WHEEZE 77.4 each 2  . butalbital-acetaminophen-caffeine (FIORICET) 50-325-40 MG tablet TAKE 1 TABLET BY MOUTH EVERY 4 (FOUR) HOURS AS NEEDED FOR HEADACHE. 65 tablet 0  . Calcium Carbonate (CALCIUM 600 PO) Take 1 tablet by mouth daily.     . cholecalciferol (VITAMIN D) 1000 units tablet Take 1,000 Units by mouth daily.    . diclofenac sodium (VOLTAREN) 1 % GEL Apply 4 g topically as needed (for pain). 100 g 5  . fluticasone (FLONASE) 50 MCG/ACT  nasal spray USE 2 SPRAYS INTO EACH NOSTRIL ONCE DAILY**REPEAT FOR 5 DAYS THEN STOP 16 g 11  . furosemide (LASIX) 40 MG tablet TAKE 1 TABLET BY MOUTH TWICE A DAY 180 tablet 1  . ipratropium-albuterol (DUONEB) 0.5-2.5 (3) MG/3ML SOLN Inhale 3 mLs into the lungs 2 (two) times daily as needed (for SOB). 270 mL 1  . KLOR-CON M20 20 MEQ tablet TAKE 1 TABLET BY MOUTH EVERY DAY 90 tablet 0  . lactulose, encephalopathy, (CHRONULAC) 10 GM/15ML SOLN Take 45 mLs (30 g total) by mouth See admin instructions. TAKE 45 MILLILITERS BY MOUTH 3 TIMES A DAY AS NEEDED FOR CONSTIPATION 1892 mL 1  . levocetirizine (XYZAL) 5 MG tablet TAKE 1 TABLET BY MOUTH EVERY DAY IN THE EVENING 90 tablet 1  . linaclotide (LINZESS) 72 MCG capsule Take 1 capsule (72 mcg total) by mouth daily before breakfast. 90 capsule 1  . montelukast (SINGULAIR) 10 MG tablet Take 1 tablet (10 mg total) by mouth at bedtime. 90 tablet 1  . nortriptyline (PAMELOR) 25 MG capsule TAKE 2 CAPSULES BY MOUTH AT BEDTIME 180 capsule 0  . Omega 3 1200 MG CAPS Take 1,200 mg by mouth every morning.     . ondansetron (ZOFRAN) 8 MG tablet TAKE 1 TABLET BY MOUTH EVERY 8 HOURS AS NEEDED FOR NAUSEA (Patient taking differently: Take 8 mg by mouth every 8 (eight) hours as needed for nausea. ) 30 tablet 2  . OXYGEN-HELIUM IN Inhale 3 L into the lungs See admin instructions. Uses when needed during the day, uses continuous throughout the night    . pantoprazole (PROTONIX) 40  MG tablet Take 1 tablet (40 mg total) by mouth daily. 90 tablet 1  . Pitavastatin Calcium (LIVALO) 2 MG TABS Take 1 tablet (2 mg total) by mouth daily. 90 tablet 1  . polyethylene glycol (MIRALAX / GLYCOLAX) 17 g packet Take 17 g by mouth daily as needed for severe constipation (if not improved with lactulose). 14 each 0  . [name] 60 MG/ML SOSY injection TO BE ADMINISTERED IN PHYSICIAN'S OFFICE. INJECT ONE SYRINGE SUBCOUSLY ONCE EVERY 6 MONTHS. REFRIGERATE. USE WITHIN 14 DAYS ONCE AT ROOM TEMPERATURE.  (Patient taking differently: Inject 60 mg into the skin every 6 (six) months. ) 1 mL 1  . SYMBICORT 160-4.5 MCG/ACT inhaler INHALE 2 PUFFS INTO THE LUNGS TWICE A DAY 30.6 each 1  . vitamin C (ASCORBIC ACID) 500 MG tablet Take 500 mg by mouth daily.     No current facility-administered medications for this visit.    LABS/IMAGING: No results found for this or any previous visit (from the past 48 hour(s)). No results found.  VITALS: BP 100/63   Pulse 71   Temp (!) 95.9 F (35.5 C)   Ht _0  (1.575 m)   Wt 113 lb 3.2 oz (51.3 kg)   SpO2 98%   BMI 20.70 kg/m   EXAM: General appearance: alert, no distress and on oxygen Neck: no carotid bruit, no JVD and thyroid not enlarged, symmetric, no tenderness/mass/nodules Lungs: diminished breath sounds bilaterally Heart: regular rate and rhythm, S1, S2 normal and systolic murmur: early systolic 3/6, crescendo at 2nd right intercostal space Abdomen: soft, non-tender; bowel sounds normal; no masses,  no organomegaly Extremities: extremities normal, atraumatic, no cyanosis or edema Pulses: 2+ and symmetric Skin: Skin color, texture, turgor normal. No rashes or lesions Neurologic: Grossly normal Psych: Pleasant  EKG: Sinus rhythm with PVCs at 71-personally reviewed  ASSESSMENT: 1. Severe COPD with moderate pulmonary hypertension, on chronic oxygen (disabled d/t this) 2. Mild to moderate aortic stenosis mean gradient 15 mmHg (12/2019) 3. Low risk nuclear stress testing in 2012 4. Dyslipidemia - on livalo   5. History of CAD with prior remote PCI  PLAN: 1.   Ms. Hopkins seems to be doing well and is fairly stable.  Her echo showed normal systolic function with no significant change in gradient in April of this year.  COPD also seems to be fairly stable on oxygen.  She denies any anginal symptoms.  Cholesterol has been reasonably well controlled on Livalo.  Follow-up with me in 6 months or sooner as necessary.  Pixie Casino, MD, [facility],  [place] Director of the [facility]  [facility] Diplomate of the American Board of Clinical Lipidology  Attending Cardiologist  Direct Dial: [phone]  Fax: [phone]  Website:  [name] 08/26/2020, 1:35 PM

## 2020-09-15 ENCOUNTER — Other Ambulatory Visit: Payer: Self-pay | Admitting: Internal Medicine

## 2020-09-15 DIAGNOSIS — E785 Hyperlipidemia, unspecified: Secondary | ICD-10-CM

## 2020-09-15 DIAGNOSIS — J301 Allergic rhinitis due to pollen: Secondary | ICD-10-CM

## 2020-09-15 DIAGNOSIS — I2510 Atherosclerotic heart disease of native coronary artery without angina pectoris: Secondary | ICD-10-CM

## 2020-09-17 ENCOUNTER — Other Ambulatory Visit: Payer: Self-pay | Admitting: Internal Medicine

## 2020-09-22 ENCOUNTER — Telehealth: Payer: Self-pay | Admitting: Internal Medicine

## 2020-09-22 NOTE — Telephone Encounter (Signed)
Copied from [place] [id number]. Topic: Medicare AWV >> Sep 22, 2020 12:47 PM Cher Nakai R wrote: Reason for CRM:   Left message for patient to call back and schedule Medicare Annual Wellness Visit (AWV) in office.   If not able to come in office, please offer to do virtually  45 minute appointment   Last AWV  01/24/2018  Please schedule at anytime with  the Nurse Health Advisor.

## 2020-10-03 ENCOUNTER — Encounter: Payer: Self-pay | Admitting: Internal Medicine

## 2020-10-06 ENCOUNTER — Other Ambulatory Visit: Payer: Self-pay | Admitting: Internal Medicine

## 2020-10-06 DIAGNOSIS — F5105 Insomnia due to other mental disorder: Secondary | ICD-10-CM

## 2020-10-06 DIAGNOSIS — F409 Phobic anxiety disorder, unspecified: Secondary | ICD-10-CM

## 2020-10-07 ENCOUNTER — Encounter: Payer: Self-pay | Admitting: Internal Medicine

## 2020-10-07 ENCOUNTER — Ambulatory Visit (INDEPENDENT_AMBULATORY_CARE_PROVIDER_SITE_OTHER): Payer: Medicare Other | Admitting: Internal Medicine

## 2020-10-07 ENCOUNTER — Other Ambulatory Visit: Payer: Self-pay

## 2020-10-07 VITALS — BP 124/78 | HR 74 | Temp 98.2°F | Resp 20 | Ht 62.0 in | Wt 110.0 lb

## 2020-10-07 DIAGNOSIS — N1831 Chronic kidney disease, stage 3a: Secondary | ICD-10-CM

## 2020-10-07 DIAGNOSIS — I10 Essential (primary) hypertension: Secondary | ICD-10-CM

## 2020-10-07 LAB — BASIC METABOLIC PANEL WITH GFR
BUN: 24 mg/dL — ABNORMAL HIGH (ref 6–23)
CO2: 40 meq/L — ABNORMAL HIGH (ref 19–32)
Calcium: 11.6 mg/dL — ABNORMAL HIGH (ref 8.4–10.5)
Chloride: 94 meq/L — ABNORMAL LOW (ref 96–112)
Creatinine, Ser: 1.34 mg/dL — ABNORMAL HIGH (ref 0.40–1.20)
GFR: 38.99 mL/min — ABNORMAL LOW (ref >60.00)
Glucose, Bld: 82 mg/dL (ref 70–99)
Potassium: 4.3 meq/L (ref 3.5–5.1)
Sodium: 137 meq/L (ref 135–145)

## 2020-10-07 LAB — CBC WITH DIFFERENTIAL/PLATELET
Basophils Absolute: 0.1 10*3/uL (ref 0.0–0.1)
Basophils Relative: 0.9 % (ref 0.0–3.0)
Eosinophils Absolute: 0.1 10*3/uL (ref 0.0–0.7)
Eosinophils Relative: 1.4 % (ref 0.0–5.0)
HCT: 41.1 % (ref 36.0–46.0)
Hemoglobin: 13.6 g/dL (ref 12.0–15.0)
Lymphocytes Relative: 21.3 % (ref 12.0–46.0)
Lymphs Abs: 1.5 10*3/uL (ref 0.7–4.0)
MCHC: 33.1 g/dL (ref 30.0–36.0)
MCV: 95.9 fl (ref 78.0–100.0)
Monocytes Absolute: 0.6 10*3/uL (ref 0.1–1.0)
Monocytes Relative: 7.9 % (ref 3.0–12.0)
Neutro Abs: 5 10*3/uL (ref 1.4–7.7)
Neutrophils Relative %: 68.5 % (ref 43.0–77.0)
Platelets: 238 10*3/uL (ref 150.0–400.0)
RBC: 4.29 Mil/uL (ref 3.87–5.11)
RDW: 13.4 % (ref 11.5–15.5)
WBC: 7.2 10*3/uL (ref 4.0–10.5)

## 2020-10-07 NOTE — Patient Instructions (Signed)

## 2020-10-07 NOTE — Progress Notes (Signed)
Subjective:  Patient ID: [name], female    DOB: Aug 06, 1946  Age: 75 y.o. MRN: [id number]  CC: Hypertension  This visit occurred during the SARS-CoV-2 public health emergency.  Safety protocols were in place, including screening questions prior to the visit, additional usage of staff PPE, and extensive cleaning of exam room while observing appropriate contact time as indicated for disinfecting solutions.    HPI [name] presents for f/up - She has her baseline level of shortness of breath. She denies chest pain, cough, fever, chills, abdominal pain, or diarrhea. She has mild, intermittent constipation.  Outpatient Medications Prior to Visit  Medication Sig Dispense Refill  . albuterol (VENTOLIN HFA) 108 (90 Base) MCG/ACT inhaler Inhale 2 puffs into the lungs every 4 (four) hours as needed for wheezing or shortness of breath. 18 g 5  . ALPRAZolam (XANAX) 1 MG tablet TAKE 1 TABLET BY MOUTH THREE TIMES A DAY AS NEEDED 90 tablet 5  . ATROVENT HFA 17 MCG/ACT inhaler INHALE 2 PUFFS BY MOUTH EVERY 4 HOURS AS NEEDED FOR WHEEZE 77.4 each 2  . butalbital-acetaminophen-caffeine (FIORICET) 50-325-40 MG tablet TAKE 1 TABLET BY MOUTH EVERY 4 (FOUR) HOURS AS NEEDED FOR HEADACHE. 65 tablet 0  . cholecalciferol (VITAMIN D) 1000 units tablet Take 1,000 Units by mouth daily.    Marland Kitchen denosumab ([name]) 60 MG/ML SOSY injection Inject 60 mg into the skin every 6 (six) months. 1 mL 1  . diclofenac sodium (VOLTAREN) 1 % GEL Apply 4 g topically as needed (for pain). 100 g 5  . fluticasone (FLONASE) 50 MCG/ACT nasal spray USE 2 SPRAYS INTO EACH NOSTRIL ONCE DAILY**REPEAT FOR 5 DAYS THEN STOP 16 g 11  . ipratropium-albuterol (DUONEB) 0.5-2.5 (3) MG/3ML SOLN Inhale 3 mLs into the lungs 2 (two) times daily as needed (for SOB). 270 mL 1  . KLOR-CON M20 20 MEQ tablet TAKE 1 TABLET BY MOUTH EVERY DAY 90 tablet 0  . lactulose, encephalopathy, (CHRONULAC) 10 GM/15ML SOLN Take 45 mLs (30 g total) by mouth See  admin instructions. TAKE 45 MILLILITERS BY MOUTH 3 TIMES A DAY AS NEEDED FOR CONSTIPATION 1892 mL 1  . levocetirizine (XYZAL) 5 MG tablet TAKE 1 TABLET BY MOUTH EVERY DAY IN THE EVENING 90 tablet 1  . linaclotide (LINZESS) 72 MCG capsule Take 1 capsule (72 mcg total) by mouth daily before breakfast. 90 capsule 1  . LIVALO 2 MG TABS TAKE 1 TABLET BY MOUTH EVERY DAY 90 tablet 1  . montelukast (SINGULAIR) 10 MG tablet TAKE 1 TABLET BY MOUTH EVERYDAY AT BEDTIME 90 tablet 1  . nortriptyline (PAMELOR) 25 MG capsule TAKE 2 CAPSULES BY MOUTH AT BEDTIME 180 capsule 0  . Omega 3 1200 MG CAPS Take 1,200 mg by mouth every morning.     . ondansetron (ZOFRAN) 8 MG tablet TAKE 1 TABLET BY MOUTH EVERY 8 HOURS AS NEEDED FOR NAUSEA (Patient taking differently: Take 8 mg by mouth every 8 (eight) hours as needed for nausea.) 30 tablet 2  . OXYGEN-HELIUM IN Inhale 3 L into the lungs See admin instructions. Uses when needed during the day, uses continuous throughout the night    . pantoprazole (PROTONIX) 40 MG tablet Take 1 tablet (40 mg total) by mouth daily. 90 tablet 1  . polyethylene glycol (MIRALAX / GLYCOLAX) 17 g packet Take 17 g by mouth daily as needed for severe constipation (if not improved with lactulose). 14 each 0  . SYMBICORT 160-4.5 MCG/ACT inhaler INHALE 2 PUFFS INTO  THE LUNGS TWICE A DAY 30.6 each 1  . vitamin C (ASCORBIC ACID) 500 MG tablet Take 500 mg by mouth daily.    . Artificial Tear Ointment (DRY EYES OP) Apply 2 drops to eye daily as needed (dry eyes).     Marland Kitchen aspirin 325 MG EC tablet Take 1 tablet (325 mg total) by mouth every morning. (Patient taking differently: Take 325 mg by mouth daily.)    . Calcium Carbonate (CALCIUM 600 PO) Take 1 tablet by mouth daily.     . furosemide (LASIX) 40 MG tablet TAKE 1 TABLET BY MOUTH TWICE A DAY 180 tablet 1   No facility-administered medications prior to visit.    ROS Review of Systems  Constitutional: Negative for chills, diaphoresis, fatigue and  fever.  HENT: Negative.   Eyes: Negative.   Respiratory: Positive for shortness of breath. Negative for cough, chest tightness and wheezing.   Cardiovascular: Negative for chest pain, palpitations and leg swelling.  Gastrointestinal: Positive for constipation. Negative for abdominal pain, blood in stool, diarrhea, nausea and vomiting.  Endocrine: Negative.   Genitourinary: Negative.  Negative for difficulty urinating, dysuria and hematuria.  Musculoskeletal: Negative.  Negative for arthralgias, back pain and myalgias.  Skin: Negative.  Negative for color change.  Neurological: Negative.   Hematological: Negative for adenopathy. Does not bruise/bleed easily.  Psychiatric/Behavioral: Negative.     Objective:  BP 124/78   Pulse 74   Temp 98.2 F (36.8 C) (Oral)   Resp 20   Ht 5\' 2"  (1.575 m)   Wt 110 lb (49.9 kg)   SpO2 97%   BMI 20.12 kg/m   BP Readings from Last 3 Encounters:  10/07/20 124/78  08/26/20 100/63  05/06/20 110/80    Wt Readings from Last 3 Encounters:  10/07/20 110 lb (49.9 kg)  08/26/20 113 lb 3.2 oz (51.3 kg)  05/06/20 107 lb (48.5 kg)    Physical Exam Vitals reviewed.  Constitutional:      Appearance: She is ill-appearing.  HENT:     Nose: Nose normal.     Mouth/Throat:     Mouth: Mucous membranes are moist.  Eyes:     General: No scleral icterus.    Conjunctiva/sclera: Conjunctivae normal.  Cardiovascular:     Rate and Rhythm: Normal rate and regular rhythm.     Heart sounds: Murmur heard.   Systolic murmur is present with a grade of 2/6. No gallop.   Pulmonary:     Effort: Tachypnea and accessory muscle usage present.     Breath sounds: Examination of the right-middle field reveals wheezing and rhonchi. Examination of the left-middle field reveals wheezing and rhonchi. Examination of the right-lower field reveals wheezing and rhonchi. Examination of the left-lower field reveals wheezing and rhonchi. Wheezing and rhonchi present. No decreased  breath sounds or rales.  Musculoskeletal:     Cervical back: Neck supple.     Right lower leg: No edema.     Left lower leg: No edema.  Lymphadenopathy:     Cervical: No cervical adenopathy.  Neurological:     Mental Status: She is alert.     Lab Results  Component Value Date   WBC 7.2 10/07/2020   HGB 13.6 10/07/2020   HCT 41.1 10/07/2020   PLT 238.0 10/07/2020   GLUCOSE 82 10/07/2020   CHOL 214 (H) 05/06/2020   TRIG 114 05/06/2020   HDL 76 05/06/2020   LDLCALC 116 (H) 05/06/2020   ALT 18 03/19/2019   AST 28 03/19/2019  NA 137 10/07/2020   K 4.3 10/07/2020   CL 94 (L) 10/07/2020   CREATININE 1.34 (H) 10/07/2020   BUN 24 (H) 10/07/2020   CO2 40 (H) 10/07/2020   TSH 1.76 05/06/2020   INR 0.87 09/05/2013   HGBA1C 5.3 10/16/2015    DG Chest 2 View  Result Date: 04/22/2020 CLINICAL DATA:  Chest pain. EXAM: CHEST - 2 VIEW COMPARISON:  Prior chest radiographs 09/11/2018 and earlier FINDINGS: Unchanged mild cardiomegaly. Aortic atherosclerosis. No appreciable airspace consolidation or pulmonary edema. No sizable pleural effusion or evidence of pneumothorax. No acute bony abnormality identified. Multiple metallic clips project in the region of the right axilla. Additionally, surgical clips are present within the upper abdomen. IMPRESSION: No evidence of acute cardiopulmonary abnormality. Unchanged mild cardiomegaly. Aortic Atherosclerosis (ICD10-I70.0). Electronically Signed   By: Kellie Simmering DO   On: 04/22/2020 12:09   CT ABDOMEN PELVIS W CONTRAST  Result Date: 04/22/2020 CLINICAL DATA:  75 year old female with history of abdominal pain. Suspected hernia. EXAM: CT ABDOMEN AND PELVIS WITH CONTRAST TECHNIQUE: Multidetector CT imaging of the abdomen and pelvis was performed using the standard protocol following bolus administration of intravenous contrast. CONTRAST:  163mL OMNIPAQUE IOHEXOL 300 MG/ML  SOLN COMPARISON:  CT the abdomen and pelvis 09/04/2015. FINDINGS: Lower chest:  Atherosclerotic calcifications in the left anterior descending, left circumflex and right coronary arteries. Severe calcifications of the aortic valve. Mild scarring and pleural thickening in the lung bases bilaterally. Hepatobiliary: No suspicious cystic or solid hepatic lesions. No intra or extrahepatic biliary ductal dilatation. Status post cholecystectomy. Pancreas: No pancreatic mass. No pancreatic ductal dilatation. No pancreatic or peripancreatic fluid collections or inflammatory changes. Spleen: Unremarkable. Adrenals/Urinary Tract: Low-attenuation lesions in both kidneys measuring up to 1.4 cm in the lower pole of the right kidney, compatible with cysts. Small amount of parenchymal thinning in the posterior aspect of the interpolar region of the left kidney, presumably post infectious scarring. No suspicious renal lesions. No hydroureteronephrosis. Bilateral adrenal glands are normal in appearance. Urinary bladder is unremarkable in appearance. Stomach/Bowel: Normal appearance of the stomach. Multiple dilated loops of mid to distal small bowel which measure up to 4.3 cm in diameter in the low anatomic pelvis where there is also some feculent contents in the small bowel, indicative of some small bowel stasis. Segment of mid to distal small bowel extends into a moderate to large infraumbilical ventral hernia. Postoperative changes of what appears to be partial right hemicolectomy. Some gas and stool is noted throughout the colon and rectum. Vascular/Lymphatic: Aortic atherosclerosis, without evidence of aneurysm or dissection in the abdominal or pelvic vasculature. No lymphadenopathy noted in the abdomen or pelvis. Reproductive: Uterus and ovaries are unremarkable in appearance. Other: No significant volume of ascites.  No pneumoperitoneum. Musculoskeletal: There are no aggressive appearing lytic or blastic lesions noted in the visualized portions of the skeleton. Chronic appearing compression fractures of L1  and L3 are noted with 25% loss of anterior vertebral body height at both levels, similar to the prior study. IMPRESSION: 1. Findings are compatible with early or partial small bowel obstruction related to a moderate to large infraumbilical ventral hernia. Surgical consultation is suggested. 2. Aortic atherosclerosis, in addition to 3 vessel coronary artery disease. Please note that although the presence of coronary artery calcium documents the presence of coronary artery disease, the severity of this disease and any potential stenosis cannot be assessed on this non-gated CT examination. Assessment for potential risk factor modification, dietary therapy or pharmacologic therapy may be  warranted, if clinically indicated. 3. There are calcifications of the aortic valve. Echocardiographic correlation for evaluation of potential valvular dysfunction may be warranted if clinically indicated. 4. Additional incidental findings, as above. Electronically Signed   By: Vinnie Langton M.D.   On: 04/22/2020 19:06   DG Abd Portable 1V-Small Bowel Obstruction Protocol-initial, 8 hr delay  Result Date: 04/23/2020 CLINICAL DATA:  75 year old female with small bowel obstruction EXAM: PORTABLE ABDOMEN - 1 VIEW COMPARISON:  Abdominal radiograph dated 04/23/2020. FINDINGS: Partially visualized enteric tube with tip in the right upper abdomen likely in the distal stomach in the region of the gastric antrum. Oral contrast noted throughout the colon. No small bowel dilatation. No free air. Right upper quadrant cholecystectomy clips. Several surgical clips noted over the abdomen. Degenerative changes of the spine. No acute osseous pathology. IMPRESSION: Partially visualized enteric tube with tip in the distal stomach. No small bowel dilatation. Electronically Signed   By: Anner Crete M.D.   On: 04/23/2020 17:25   DG Abd Portable 1V-Small Bowel Protocol-Position Verification  Result Date: 04/23/2020 CLINICAL DATA:  NG tube  placement EXAM: PORTABLE ABDOMEN - 1 VIEW COMPARISON:  CT 04/22/2020 FINDINGS: Interval placement of nasogastric tube which courses below the diaphragm and distal tip projecting near the midline of the mid abdomen likely at the junction of the second and third portions of the duodenum. No dilated loops of small bowel are appreciated. Moderate volume of stool throughout the colon. Excreted contrast within the urinary bladder. Mild wedge deformities of L1 and L3 as seen on prior CT. IMPRESSION: Interval placement of nasogastric tube with distal tip projecting near the midline of the mid abdomen likely at the junction of the second and third portions of the duodenum. Electronically Signed   By: Davina Poke D.O.   On: 04/23/2020 08:08    Assessment & Plan:   [name] was seen today for hypertension.  Diagnoses and all orders for this visit:  Primary hypertension- Her blood pressure is well controlled but she has a prerenal azotemia with a slight decline in her renal function. Her chloride is also low. I have asked her to stop taking the loop diuretic. -     CBC with Differential/Platelet; Future -     Basic metabolic panel; Future -     Basic metabolic panel -     CBC with Differential/Platelet  Stage 3a chronic kidney disease ([place])- See above. -     CBC with Differential/Platelet; Future -     Basic metabolic panel; Future -     Basic metabolic panel -     CBC with Differential/Platelet  Hypercalcemia- I have asked her to stop taking calcium supplements. I have asked her to return in 1 to 2 weeks to have her calcium level rechecked at which time I will also screen her for secondary causes of hypercalcemia.   I have discontinued [name] "[name]"'s aspirin, Artificial Tear Ointment (DRY EYES OP), Calcium Carbonate (CALCIUM 600 PO), and furosemide. I am also having her maintain her OXYGEN-HELIUM IN, Omega 3, cholecalciferol, vitamin C, fluticasone, ondansetron, diclofenac sodium,  ipratropium-albuterol, butalbital-acetaminophen-caffeine, polyethylene glycol, albuterol, Atrovent HFA, levocetirizine, Symbicort, pantoprazole, ALPRAZolam, lactulose (encephalopathy), linaclotide, Livalo, montelukast, Klor-Con M20, [name], and nortriptyline.  No orders of the defined types were placed in this encounter.    Follow-up: Return in about 6 months (around 04/06/2021).  Scarlette Calico, MD

## 2020-10-17 ENCOUNTER — Other Ambulatory Visit: Payer: Self-pay | Admitting: Internal Medicine

## 2020-10-17 DIAGNOSIS — J449 Chronic obstructive pulmonary disease, unspecified: Secondary | ICD-10-CM

## 2020-10-23 ENCOUNTER — Telehealth: Payer: Self-pay | Admitting: Internal Medicine

## 2020-10-23 NOTE — Telephone Encounter (Signed)
Patient scheduled for [name] injection on 2.14.22. Benefits investigation submitted on 1.27.22

## 2020-10-27 ENCOUNTER — Other Ambulatory Visit: Payer: Self-pay | Admitting: Internal Medicine

## 2020-10-27 DIAGNOSIS — G43009 Migraine without aura, not intractable, without status migrainosus: Secondary | ICD-10-CM

## 2020-10-30 ENCOUNTER — Encounter: Payer: Self-pay | Admitting: Internal Medicine

## 2020-10-30 ENCOUNTER — Ambulatory Visit (INDEPENDENT_AMBULATORY_CARE_PROVIDER_SITE_OTHER): Payer: Medicare Other | Admitting: Internal Medicine

## 2020-10-30 ENCOUNTER — Other Ambulatory Visit: Payer: Self-pay | Admitting: Internal Medicine

## 2020-10-30 ENCOUNTER — Other Ambulatory Visit: Payer: Self-pay

## 2020-10-30 VITALS — BP 114/72 | Temp 98.2°F | Ht 62.0 in | Wt 113.0 lb

## 2020-10-30 DIAGNOSIS — E213 Hyperparathyroidism, unspecified: Secondary | ICD-10-CM

## 2020-10-30 DIAGNOSIS — N1831 Chronic kidney disease, stage 3a: Secondary | ICD-10-CM | POA: Diagnosis not present

## 2020-10-30 DIAGNOSIS — L219 Seborrheic dermatitis, unspecified: Secondary | ICD-10-CM | POA: Insufficient documentation

## 2020-10-30 LAB — PHOSPHORUS: Phosphorus: 2.8 mg/dL (ref 2.3–4.6)

## 2020-10-30 LAB — BASIC METABOLIC PANEL
BUN: 15 mg/dL (ref 6–23)
CO2: 36 mEq/L — ABNORMAL HIGH (ref 19–32)
Calcium: 10.9 mg/dL — ABNORMAL HIGH (ref 8.4–10.5)
Chloride: 99 mEq/L (ref 96–112)
Creatinine, Ser: 1 mg/dL (ref 0.40–1.20)
GFR: 55.37 mL/min — ABNORMAL LOW (ref >60.00)
Glucose, Bld: 78 mg/dL (ref 70–99)
Potassium: 4.1 mEq/L (ref 3.5–5.1)
Sodium: 138 mEq/L (ref 135–145)

## 2020-10-30 LAB — VITAMIN D 25 HYDROXY (VIT D DEFICIENCY, FRACTURES): VITD: 79.41 ng/mL (ref 30.00–100.00)

## 2020-10-30 LAB — MAGNESIUM: Magnesium: 2.1 mg/dL (ref 1.5–2.5)

## 2020-10-30 MED ORDER — ITRACONAZOLE 200 MG PO TABS: 1.0000 | ORAL_TABLET | Freq: Every day | ORAL | 0 refills | Status: DC

## 2020-10-30 MED ORDER — KETOCONAZOLE 200 MG PO TABS: 200.0000 mg | ORAL_TABLET | Freq: Every day | ORAL | 0 refills | Status: AC

## 2020-10-30 NOTE — Patient Instructions (Signed)
Seborrheic Dermatitis, Adult Seborrheic dermatitis is a skin disease that causes red, scaly patches. It usually occurs on the scalp, and it is often called dandruff. The patches may appear on other parts of the body. Skin patches tend to appear where there are many oil glands in the skin. Areas of the body that are commonly affected include the:  Scalp.  Ears.  Eyebrows.  Face.  Bearded area of men's faces.  Skin folds of the body, such as the armpits, groin, and buttocks.  Chest. The condition may come and go for no known reason, and it is often long-lasting (chronic). What are the causes? The cause of this condition is not known. What increases the risk? The following factors may make you more likely to develop this condition:  Having certain conditions, such as: ? HIV (human immunodeficiency virus). ? AIDS (acquired immunodeficiency syndrome). ? Parkinson's disease. ? Mood disorders, such as depression.  Being 40-60 years old. What are the signs or symptoms? Symptoms of this condition include:  Thick scales on the scalp.  Redness on the face or in the armpits.  Skin that is flaky. The flakes may be white or yellow.  Skin that seems oily or dry but is not helped with moisturizers.  Itching or burning in the affected areas.   How is this diagnosed? This condition is diagnosed with a medical history and physical exam. A sample of your skin may be tested (skin biopsy). You may need to see a skin specialist (dermatologist). How is this treated? There is no cure for this condition, but treatment can help to manage the symptoms. You may get treatment to remove scales, lower the risk of skin infection, and reduce swelling or itching. Treatment may include:  Creams that reduce skin yeast.  Medicated shampoo.  Moisturizing creams or ointments.  Creams that reduce swelling and irritation (steroids). Follow these instructions at home:  Apply over-the-counter and  prescription medicines only as told by your health care provider.  Use any medicated shampoo, skin creams, or ointments only as told by your health care provider.  Keep all follow-up visits as told by your health care provider. This is important. Contact a health care provider if:  Your symptoms do not improve with treatment.  Your symptoms get worse.  You have new symptoms. Get help right away if:  Your condition rapidly worsens with treatment. Summary  Seborrheic dermatitis is a skin disease that causes red, scaly patches.  Seborrheic dermatitis commonly affects the scalp, face, and skin folds.  There is no cure for this condition, but treatment can help to manage the symptoms. This information is not intended to replace advice given to you by your health care provider. Make sure you discuss any questions you have with your health care provider. Document Revised: 06/21/2019 Document Reviewed: 06/21/2019 [employer] Patient Education  2021 [employer]  

## 2020-10-30 NOTE — Progress Notes (Signed)
Subjective:  Patient ID: [name], female    DOB: [birth year]  Age: 75 y.o. MRN: [id number]  CC: Rash  This visit occurred during the SARS-CoV-2 public health emergency.  Safety protocols were in place, including screening questions prior to the visit, additional usage of staff PPE, and extensive cleaning of exam room while observing appropriate contact time as indicated for disinfecting solutions.    HPI [name] presents for f/up -   She returns for follow-up on a recent lab that revealed prerenal azotemia and hypercalcemia.  She tells me that for the last few weeks she has had a rash around her eyebrows and ears.  She tells me it is not responding to topical steroids.  She says it feels like the rash is starting to get into her ear canals.  The rash both itches and burns.  Outpatient Medications Prior to Visit  Medication Sig Dispense Refill  . ALPRAZolam (XANAX) 1 MG tablet TAKE 1 TABLET BY MOUTH THREE TIMES A DAY AS NEEDED 90 tablet 5  . ATROVENT HFA 17 MCG/ACT inhaler INHALE 2 PUFFS BY MOUTH EVERY 4 HOURS AS NEEDED FOR WHEEZE 77.4 each 2  . butalbital-acetaminophen-caffeine (FIORICET) 50-325-40 MG tablet TAKE 1 TABLET BY MOUTH EVERY 4 (FOUR) HOURS AS NEEDED FOR HEADACHE. 65 tablet 3  . cholecalciferol (VITAMIN D) 1000 units tablet Take 1,000 Units by mouth daily.    Marland Kitchen denosumab ([name]) 60 MG/ML SOSY injection Inject 60 mg into the skin every 6 (six) months. 1 mL 1  . diclofenac sodium (VOLTAREN) 1 % GEL Apply 4 g topically as needed (for pain). 100 g 5  . fluticasone (FLONASE) 50 MCG/ACT nasal spray USE 2 SPRAYS INTO EACH NOSTRIL ONCE DAILY**REPEAT FOR 5 DAYS THEN STOP 16 g 11  . ipratropium-albuterol (DUONEB) 0.5-2.5 (3) MG/3ML SOLN Inhale 3 mLs into the lungs 2 (two) times daily as needed (for SOB). 270 mL 1  . KLOR-CON M20 20 MEQ tablet TAKE 1 TABLET BY MOUTH EVERY DAY 90 tablet 0  . lactulose, encephalopathy, (CHRONULAC) 10 GM/15ML SOLN Take 45 mLs (30 g  total) by mouth See admin instructions. TAKE 45 MILLILITERS BY MOUTH 3 TIMES A DAY AS NEEDED FOR CONSTIPATION 1892 mL 1  . levocetirizine (XYZAL) 5 MG tablet TAKE 1 TABLET BY MOUTH EVERY DAY IN THE EVENING 90 tablet 1  . linaclotide (LINZESS) 72 MCG capsule Take 1 capsule (72 mcg total) by mouth daily before breakfast. 90 capsule 1  . LIVALO 2 MG TABS TAKE 1 TABLET BY MOUTH EVERY DAY 90 tablet 1  . montelukast (SINGULAIR) 10 MG tablet TAKE 1 TABLET BY MOUTH EVERYDAY AT BEDTIME 90 tablet 1  . nortriptyline (PAMELOR) 25 MG capsule TAKE 2 CAPSULES BY MOUTH AT BEDTIME 180 capsule 0  . Omega 3 1200 MG CAPS Take 1,200 mg by mouth every morning.     . ondansetron (ZOFRAN) 8 MG tablet TAKE 1 TABLET BY MOUTH EVERY 8 HOURS AS NEEDED FOR NAUSEA (Patient taking differently: Take 8 mg by mouth every 8 (eight) hours as needed for nausea.) 30 tablet 2  . OXYGEN-HELIUM IN Inhale 3 L into the lungs See admin instructions. Uses when needed during the day, uses continuous throughout the night    . pantoprazole (PROTONIX) 40 MG tablet Take 1 tablet (40 mg total) by mouth daily. 90 tablet 1  . polyethylene glycol (MIRALAX / GLYCOLAX) 17 g packet Take 17 g by mouth daily as needed for  severe constipation (if not improved with lactulose). 14 each 0  . SYMBICORT 160-4.5 MCG/ACT inhaler INHALE 2 PUFFS INTO THE LUNGS TWICE A DAY 30.6 each 1  . VENTOLIN HFA 108 (90 Base) MCG/ACT inhaler INHALE 2 PUFFS INTO THE LUNGS EVERY 4 HOURS AS NEEDED FOR WHEEZE OR FOR SHORTNESS OF BREATH 36 each 2  . vitamin C (ASCORBIC ACID) 500 MG tablet Take 500 mg by mouth daily.     No facility-administered medications prior to visit.    ROS Review of Systems  Objective:  BP 114/72   Temp 98.2 F (36.8 C) (Oral)   Ht 5\' 2"  (1.575 m)   Wt 113 lb (51.3 kg)   BMI 20.67 kg/m   BP Readings from Last 3 Encounters:  10/30/20 114/72  10/07/20 124/78  08/26/20 100/63    Wt Readings from Last 3 Encounters:  10/30/20 113 lb (51.3 kg)   10/07/20 110 lb (49.9 kg)  08/26/20 113 lb 3.2 oz (51.3 kg)    Physical Exam Vitals reviewed.  Constitutional:      Appearance: She is ill-appearing.  HENT:     Nose: Nose normal.     Mouth/Throat:     Mouth: Mucous membranes are moist.  Eyes:     General: No scleral icterus.    Conjunctiva/sclera: Conjunctivae normal.  Cardiovascular:     Rate and Rhythm: Normal rate.     Heart sounds: Murmur heard.   Systolic murmur is present with a grade of 2/6.  No diastolic murmur is present. No gallop.   Pulmonary:     Effort: Accessory muscle usage present. No tachypnea.     Breath sounds: No stridor or decreased air movement. Examination of the right-middle field reveals decreased breath sounds. Examination of the left-middle field reveals decreased breath sounds. Decreased breath sounds present. No wheezing, rhonchi or rales.  Abdominal:     General: Abdomen is flat.     Palpations: There is no mass.     Tenderness: There is no abdominal tenderness. There is no guarding.  Musculoskeletal:        General: Normal range of motion.     Cervical back: Neck supple.     Right lower leg: No edema.     Left lower leg: No edema.  Lymphadenopathy:     Cervical: No cervical adenopathy.  Skin:    General: Skin is warm.     Findings: Erythema and rash present.     Comments: In between her eyebrows more on the left than the right there is a large patch of erythema with thick greasy scale.  This is also seen in both ears and extends into the EACs.  See photos.  There is no exudate, induration, fluctuance, tenderness, or pustules.  Neurological:     General: No focal deficit present.     Mental Status: She is alert.  Psychiatric:        Mood and Affect: Mood normal.        Behavior: Behavior normal.     Lab Results  Component Value Date   WBC 7.2 10/07/2020   HGB 13.6 10/07/2020   HCT 41.1 10/07/2020   PLT 238.0 10/07/2020   GLUCOSE 78 10/30/2020   CHOL 214 (H) 05/06/2020   TRIG 114  05/06/2020   HDL 76 05/06/2020   LDLCALC 116 (H) 05/06/2020   ALT 18 03/19/2019   AST 28 03/19/2019   NA 138 10/30/2020   K 4.1 10/30/2020   CL 99 10/30/2020  CREATININE 1.00 10/30/2020   BUN 15 10/30/2020   CO2 36 (H) 10/30/2020   TSH 1.76 05/06/2020   INR 0.87 09/05/2013   HGBA1C 5.3 10/16/2015    DG Chest 2 View  Result Date: 04/22/2020 CLINICAL DATA:  Chest pain. EXAM: CHEST - 2 VIEW COMPARISON:  Prior chest radiographs 09/11/2018 and earlier FINDINGS: Unchanged mild cardiomegaly. Aortic atherosclerosis. No appreciable airspace consolidation or pulmonary edema. No sizable pleural effusion or evidence of pneumothorax. No acute bony abnormality identified. Multiple metallic clips project in the region of the right axilla. Additionally, surgical clips are present within the upper abdomen. IMPRESSION: No evidence of acute cardiopulmonary abnormality. Unchanged mild cardiomegaly. Aortic Atherosclerosis (ICD10-I70.0). Electronically Signed   By: Kellie Simmering DO   On: 04/22/2020 12:09   CT ABDOMEN PELVIS W CONTRAST  Result Date: 04/22/2020 CLINICAL DATA:  75 year old female with history of abdominal pain. Suspected hernia. EXAM: CT ABDOMEN AND PELVIS WITH CONTRAST TECHNIQUE: Multidetector CT imaging of the abdomen and pelvis was performed using the standard protocol following bolus administration of intravenous contrast. CONTRAST:  167mL OMNIPAQUE IOHEXOL 300 MG/ML  SOLN COMPARISON:  CT the abdomen and pelvis 09/04/2015. FINDINGS: Lower chest: Atherosclerotic calcifications in the left anterior descending, left circumflex and right coronary arteries. Severe calcifications of the aortic valve. Mild scarring and pleural thickening in the lung bases bilaterally. Hepatobiliary: No suspicious cystic or solid hepatic lesions. No intra or extrahepatic biliary ductal dilatation. Status post cholecystectomy. Pancreas: No pancreatic mass. No pancreatic ductal dilatation. No pancreatic or peripancreatic  fluid collections or inflammatory changes. Spleen: Unremarkable. Adrenals/Urinary Tract: Low-attenuation lesions in both kidneys measuring up to 1.4 cm in the lower pole of the right kidney, compatible with cysts. Small amount of parenchymal thinning in the posterior aspect of the interpolar region of the left kidney, presumably post infectious scarring. No suspicious renal lesions. No hydroureteronephrosis. Bilateral adrenal glands are normal in appearance. Urinary bladder is unremarkable in appearance. Stomach/Bowel: Normal appearance of the stomach. Multiple dilated loops of mid to distal small bowel which measure up to 4.3 cm in diameter in the low anatomic pelvis where there is also some feculent contents in the small bowel, indicative of some small bowel stasis. Segment of mid to distal small bowel extends into a moderate to large infraumbilical ventral hernia. Postoperative changes of what appears to be partial right hemicolectomy. Some gas and stool is noted throughout the colon and rectum. Vascular/Lymphatic: Aortic atherosclerosis, without evidence of aneurysm or dissection in the abdominal or pelvic vasculature. No lymphadenopathy noted in the abdomen or pelvis. Reproductive: Uterus and ovaries are unremarkable in appearance. Other: No significant volume of ascites.  No pneumoperitoneum. Musculoskeletal: There are no aggressive appearing lytic or blastic lesions noted in the visualized portions of the skeleton. Chronic appearing compression fractures of L1 and L3 are noted with 25% loss of anterior vertebral body height at both levels, similar to the prior study. IMPRESSION: 1. Findings are compatible with early or partial small bowel obstruction related to a moderate to large infraumbilical ventral hernia. Surgical consultation is suggested. 2. Aortic atherosclerosis, in addition to 3 vessel coronary artery disease. Please note that although the presence of coronary artery calcium documents the presence  of coronary artery disease, the severity of this disease and any potential stenosis cannot be assessed on this non-gated CT examination. Assessment for potential risk factor modification, dietary therapy or pharmacologic therapy may be warranted, if clinically indicated. 3. There are calcifications of the aortic valve. Echocardiographic correlation for evaluation of potential  valvular dysfunction may be warranted if clinically indicated. 4. Additional incidental findings, as above. Electronically Signed   By: Vinnie Langton M.D.   On: 04/22/2020 19:06   DG Abd Portable 1V-Small Bowel Obstruction Protocol-initial, 8 hr delay  Result Date: 04/23/2020 CLINICAL DATA:  75 year old female with small bowel obstruction EXAM: PORTABLE ABDOMEN - 1 VIEW COMPARISON:  Abdominal radiograph dated 04/23/2020. FINDINGS: Partially visualized enteric tube with tip in the right upper abdomen likely in the distal stomach in the region of the gastric antrum. Oral contrast noted throughout the colon. No small bowel dilatation. No free air. Right upper quadrant cholecystectomy clips. Several surgical clips noted over the abdomen. Degenerative changes of the spine. No acute osseous pathology. IMPRESSION: Partially visualized enteric tube with tip in the distal stomach. No small bowel dilatation. Electronically Signed   By: Anner Crete M.D.   On: 04/23/2020 17:25   DG Abd Portable 1V-Small Bowel Protocol-Position Verification  Result Date: 04/23/2020 CLINICAL DATA:  NG tube placement EXAM: PORTABLE ABDOMEN - 1 VIEW COMPARISON:  CT 04/22/2020 FINDINGS: Interval placement of nasogastric tube which courses below the diaphragm and distal tip projecting near the midline of the mid abdomen likely at the junction of the second and third portions of the duodenum. No dilated loops of small bowel are appreciated. Moderate volume of stool throughout the colon. Excreted contrast within the urinary bladder. Mild wedge deformities of L1 and  L3 as seen on prior CT. IMPRESSION: Interval placement of nasogastric tube with distal tip projecting near the midline of the mid abdomen likely at the junction of the second and third portions of the duodenum. Electronically Signed   By: Davina Poke D.O.   On: 04/23/2020 08:08    Assessment & Plan:   Sennie was seen today for rash.  Diagnoses and all orders for this visit:  Acute seborrheic dermatitis- She has extensive involvement so will treat with a systemic agent. -     Discontinue: Itraconazole 200 MG TABS; Take 1 tablet by mouth daily for 7 days. -     ketoconazole (NIZORAL) 200 MG tablet; Take 1 tablet (200 mg total) by mouth daily for 21 days.  Stage 3a chronic kidney disease ([place])- Her renal function is stable. -     Basic metabolic panel; Future -     Basic metabolic panel  Hypercalcemia- Her calcium level is lower than it was but it remains elevated.  Her PTH is normal.  I am concerned she has primary hyperthyroidism so I have asked her to see endocrinology and to avoid calcium supplements. -     Basic metabolic panel; Future -     VITAMIN D 25 Hydroxy (Vit-D Deficiency, Fractures); Future -     Magnesium; Future -     Phosphorus; Future -     PTH, intact and calcium; Future -     PTH, intact and calcium -     Phosphorus -     Magnesium -     VITAMIN D 25 Hydroxy (Vit-D Deficiency, Fractures) -     Basic metabolic panel -     Ambulatory referral to Endocrinology  Hyperparathyroidism [facility]) -     Ambulatory referral to Endocrinology   I have discontinued [name] "Johnay Housley"'s Itraconazole. I am also having her start on ketoconazole. Additionally, I am having her maintain her OXYGEN-HELIUM IN, Omega 3, cholecalciferol, vitamin C, fluticasone, ondansetron, diclofenac sodium, ipratropium-albuterol, polyethylene glycol, Atrovent HFA, levocetirizine, Symbicort, pantoprazole, ALPRAZolam, lactulose (encephalopathy), linaclotide, Livalo, montelukast,  Klor-Con M20,  [name], nortriptyline, Ventolin HFA, and butalbital-acetaminophen-caffeine.  Meds ordered this encounter  Medications  . DISCONTD: Itraconazole 200 MG TABS    Sig: Take 1 tablet by mouth daily for 7 days.    Dispense:  7 tablet    Refill:  0  . ketoconazole (NIZORAL) 200 MG tablet    Sig: Take 1 tablet (200 mg total) by mouth daily for 21 days.    Dispense:  21 tablet    Refill:  0     Follow-up: Return in about 2 weeks (around 11/13/2020).  Scarlette Calico, MD

## 2020-11-03 LAB — PTH, INTACT AND CALCIUM
Calcium: 10.7 mg/dL — ABNORMAL HIGH (ref 8.6–10.4)
PTH: 40 pg/mL (ref 14–64)

## 2020-11-04 DIAGNOSIS — E213 Hyperparathyroidism, unspecified: Secondary | ICD-10-CM | POA: Insufficient documentation

## 2020-11-07 NOTE — Telephone Encounter (Signed)
[facility] delivered [name] for [name] on 2.11.22, patient is scheduled for Monday 2.14.22 to have her injection  RX number: [id number]

## 2020-11-10 ENCOUNTER — Other Ambulatory Visit: Payer: Self-pay

## 2020-11-10 ENCOUNTER — Ambulatory Visit (INDEPENDENT_AMBULATORY_CARE_PROVIDER_SITE_OTHER): Payer: Medicare Other

## 2020-11-10 DIAGNOSIS — M810 Age-related osteoporosis without current pathological fracture: Secondary | ICD-10-CM

## 2020-11-10 MED ORDER — DENOSUMAB 60 MG/ML ~~LOC~~ SOSY
60.0000 mg | PREFILLED_SYRINGE | Freq: Once | SUBCUTANEOUS | Status: AC
  Administered 2020-11-10: 60 mg via SUBCUTANEOUS

## 2020-11-11 NOTE — Progress Notes (Addendum)
[name] given.  Please cosign.   Medical screening examination/treatment/procedure(s) were performed by non-physician practitioner and as supervising physician I was immediately available for consultation/collaboration.  I agree with above. Lew Dawes, MD

## 2020-11-15 ENCOUNTER — Other Ambulatory Visit: Payer: Self-pay | Admitting: Internal Medicine

## 2020-11-15 DIAGNOSIS — J438 Other emphysema: Secondary | ICD-10-CM

## 2020-11-15 DIAGNOSIS — J42 Unspecified chronic bronchitis: Secondary | ICD-10-CM

## 2020-11-15 DIAGNOSIS — F409 Phobic anxiety disorder, unspecified: Secondary | ICD-10-CM

## 2020-11-15 DIAGNOSIS — F5105 Insomnia due to other mental disorder: Secondary | ICD-10-CM

## 2020-12-08 ENCOUNTER — Other Ambulatory Visit: Payer: Self-pay | Admitting: Internal Medicine

## 2020-12-08 ENCOUNTER — Telehealth: Payer: Self-pay | Admitting: Internal Medicine

## 2020-12-08 DIAGNOSIS — J301 Allergic rhinitis due to pollen: Secondary | ICD-10-CM

## 2020-12-08 MED ORDER — LEVOCETIRIZINE DIHYDROCHLORIDE 5 MG PO TABS: 5.0000 mg | ORAL_TABLET | Freq: Every evening | ORAL | 1 refills | Status: DC

## 2020-12-08 NOTE — Telephone Encounter (Signed)
Patient request order for levocetirizine (XYZAL) 5 MG tablet  Pharmacy [id number] - [place], [place] - [address]

## 2020-12-22 ENCOUNTER — Other Ambulatory Visit: Payer: Self-pay

## 2020-12-22 ENCOUNTER — Inpatient Hospital Stay (HOSPITAL_COMMUNITY)
Admission: EM | Admit: 2020-12-22 | Discharge: 2020-12-25 | DRG: 871 | Disposition: A | Discharge status: Home / Self Care | Payer: Medicare Other | Attending: Student | Admitting: Student

## 2020-12-22 DIAGNOSIS — F17210 Nicotine dependence, cigarettes, uncomplicated: Secondary | ICD-10-CM | POA: Diagnosis present

## 2020-12-22 DIAGNOSIS — Z87892 Personal history of anaphylaxis: Secondary | ICD-10-CM

## 2020-12-22 DIAGNOSIS — Z820 Family history of epilepsy and other diseases of the nervous system: Secondary | ICD-10-CM

## 2020-12-22 DIAGNOSIS — I252 Old myocardial infarction: Secondary | ICD-10-CM

## 2020-12-22 DIAGNOSIS — Z9049 Acquired absence of other specified parts of digestive tract: Secondary | ICD-10-CM

## 2020-12-22 DIAGNOSIS — A419 Sepsis, unspecified organism: Principal | ICD-10-CM | POA: Diagnosis present

## 2020-12-22 DIAGNOSIS — Z716 Tobacco abuse counseling: Secondary | ICD-10-CM

## 2020-12-22 DIAGNOSIS — E785 Hyperlipidemia, unspecified: Secondary | ICD-10-CM | POA: Diagnosis present

## 2020-12-22 DIAGNOSIS — Z955 Presence of coronary angioplasty implant and graft: Secondary | ICD-10-CM

## 2020-12-22 DIAGNOSIS — I272 Pulmonary hypertension, unspecified: Secondary | ICD-10-CM | POA: Diagnosis present

## 2020-12-22 DIAGNOSIS — G43909 Migraine, unspecified, not intractable, without status migrainosus: Secondary | ICD-10-CM | POA: Diagnosis present

## 2020-12-22 DIAGNOSIS — Z83438 Family history of other disorder of lipoprotein metabolism and other lipidemia: Secondary | ICD-10-CM

## 2020-12-22 DIAGNOSIS — Z9981 Dependence on supplemental oxygen: Secondary | ICD-10-CM

## 2020-12-22 DIAGNOSIS — Z833 Family history of diabetes mellitus: Secondary | ICD-10-CM

## 2020-12-22 DIAGNOSIS — J9621 Acute and chronic respiratory failure with hypoxia: Secondary | ICD-10-CM | POA: Diagnosis present

## 2020-12-22 DIAGNOSIS — I5032 Chronic diastolic (congestive) heart failure: Secondary | ICD-10-CM | POA: Diagnosis present

## 2020-12-22 DIAGNOSIS — Z9842 Cataract extraction status, left eye: Secondary | ICD-10-CM

## 2020-12-22 DIAGNOSIS — I2510 Atherosclerotic heart disease of native coronary artery without angina pectoris: Secondary | ICD-10-CM | POA: Diagnosis present

## 2020-12-22 DIAGNOSIS — J189 Pneumonia, unspecified organism: Secondary | ICD-10-CM | POA: Diagnosis present

## 2020-12-22 DIAGNOSIS — Z20822 Contact with and (suspected) exposure to covid-19: Secondary | ICD-10-CM | POA: Diagnosis present

## 2020-12-22 DIAGNOSIS — K5909 Other constipation: Secondary | ICD-10-CM | POA: Diagnosis present

## 2020-12-22 DIAGNOSIS — Z818 Family history of other mental and behavioral disorders: Secondary | ICD-10-CM

## 2020-12-22 DIAGNOSIS — I130 Hypertensive heart and chronic kidney disease with heart failure and stage 1 through stage 4 chronic kidney disease, or unspecified chronic kidney disease: Secondary | ICD-10-CM | POA: Diagnosis present

## 2020-12-22 DIAGNOSIS — Z853 Personal history of malignant neoplasm of breast: Secondary | ICD-10-CM

## 2020-12-22 DIAGNOSIS — D519 Vitamin B12 deficiency anemia, unspecified: Secondary | ICD-10-CM | POA: Diagnosis present

## 2020-12-22 DIAGNOSIS — N1831 Chronic kidney disease, stage 3a: Secondary | ICD-10-CM | POA: Diagnosis present

## 2020-12-22 DIAGNOSIS — J440 Chronic obstructive pulmonary disease with acute lower respiratory infection: Secondary | ICD-10-CM | POA: Diagnosis present

## 2020-12-22 DIAGNOSIS — F419 Anxiety disorder, unspecified: Secondary | ICD-10-CM | POA: Diagnosis present

## 2020-12-22 DIAGNOSIS — Z961 Presence of intraocular lens: Secondary | ICD-10-CM | POA: Diagnosis present

## 2020-12-22 DIAGNOSIS — Z888 Allergy status to other drugs, medicaments and biological substances status: Secondary | ICD-10-CM

## 2020-12-22 DIAGNOSIS — R6520 Severe sepsis without septic shock: Secondary | ICD-10-CM | POA: Diagnosis present

## 2020-12-22 DIAGNOSIS — R5381 Other malaise: Secondary | ICD-10-CM | POA: Diagnosis present

## 2020-12-22 DIAGNOSIS — J441 Chronic obstructive pulmonary disease with (acute) exacerbation: Secondary | ICD-10-CM | POA: Diagnosis present

## 2020-12-22 DIAGNOSIS — I10 Essential (primary) hypertension: Secondary | ICD-10-CM | POA: Diagnosis present

## 2020-12-22 DIAGNOSIS — I25810 Atherosclerosis of coronary artery bypass graft(s) without angina pectoris: Secondary | ICD-10-CM | POA: Diagnosis present

## 2020-12-22 DIAGNOSIS — R778 Other specified abnormalities of plasma proteins: Secondary | ICD-10-CM

## 2020-12-22 DIAGNOSIS — F411 Generalized anxiety disorder: Secondary | ICD-10-CM | POA: Diagnosis present

## 2020-12-22 DIAGNOSIS — J438 Other emphysema: Secondary | ICD-10-CM

## 2020-12-22 DIAGNOSIS — Z881 Allergy status to other antibiotic agents status: Secondary | ICD-10-CM

## 2020-12-22 DIAGNOSIS — J9611 Chronic respiratory failure with hypoxia: Secondary | ICD-10-CM | POA: Diagnosis present

## 2020-12-22 DIAGNOSIS — Z9841 Cataract extraction status, right eye: Secondary | ICD-10-CM

## 2020-12-22 DIAGNOSIS — I248 Other forms of acute ischemic heart disease: Secondary | ICD-10-CM | POA: Diagnosis present

## 2020-12-22 DIAGNOSIS — Z8249 Family history of ischemic heart disease and other diseases of the circulatory system: Secondary | ICD-10-CM

## 2020-12-22 DIAGNOSIS — N183 Chronic kidney disease, stage 3 unspecified: Secondary | ICD-10-CM | POA: Diagnosis present

## 2020-12-23 ENCOUNTER — Emergency Department (HOSPITAL_COMMUNITY): Payer: Medicare Other

## 2020-12-23 ENCOUNTER — Encounter (HOSPITAL_COMMUNITY): Payer: Self-pay

## 2020-12-23 DIAGNOSIS — I252 Old myocardial infarction: Secondary | ICD-10-CM | POA: Diagnosis not present

## 2020-12-23 DIAGNOSIS — N179 Acute kidney failure, unspecified: Secondary | ICD-10-CM | POA: Diagnosis not present

## 2020-12-23 DIAGNOSIS — I2510 Atherosclerotic heart disease of native coronary artery without angina pectoris: Secondary | ICD-10-CM

## 2020-12-23 DIAGNOSIS — J440 Chronic obstructive pulmonary disease with acute lower respiratory infection: Secondary | ICD-10-CM | POA: Diagnosis present

## 2020-12-23 DIAGNOSIS — J189 Pneumonia, unspecified organism: Secondary | ICD-10-CM | POA: Diagnosis present

## 2020-12-23 DIAGNOSIS — R778 Other specified abnormalities of plasma proteins: Secondary | ICD-10-CM | POA: Diagnosis not present

## 2020-12-23 DIAGNOSIS — N1831 Chronic kidney disease, stage 3a: Secondary | ICD-10-CM | POA: Diagnosis present

## 2020-12-23 DIAGNOSIS — I10 Essential (primary) hypertension: Secondary | ICD-10-CM

## 2020-12-23 DIAGNOSIS — I130 Hypertensive heart and chronic kidney disease with heart failure and stage 1 through stage 4 chronic kidney disease, or unspecified chronic kidney disease: Secondary | ICD-10-CM | POA: Diagnosis present

## 2020-12-23 DIAGNOSIS — D519 Vitamin B12 deficiency anemia, unspecified: Secondary | ICD-10-CM | POA: Diagnosis present

## 2020-12-23 DIAGNOSIS — J9611 Chronic respiratory failure with hypoxia: Secondary | ICD-10-CM | POA: Diagnosis not present

## 2020-12-23 DIAGNOSIS — Z853 Personal history of malignant neoplasm of breast: Secondary | ICD-10-CM | POA: Diagnosis not present

## 2020-12-23 DIAGNOSIS — F17210 Nicotine dependence, cigarettes, uncomplicated: Secondary | ICD-10-CM | POA: Diagnosis present

## 2020-12-23 DIAGNOSIS — Z888 Allergy status to other drugs, medicaments and biological substances status: Secondary | ICD-10-CM | POA: Diagnosis not present

## 2020-12-23 DIAGNOSIS — K5909 Other constipation: Secondary | ICD-10-CM | POA: Diagnosis present

## 2020-12-23 DIAGNOSIS — R6520 Severe sepsis without septic shock: Secondary | ICD-10-CM | POA: Diagnosis present

## 2020-12-23 DIAGNOSIS — I5032 Chronic diastolic (congestive) heart failure: Secondary | ICD-10-CM | POA: Diagnosis present

## 2020-12-23 DIAGNOSIS — Z9981 Dependence on supplemental oxygen: Secondary | ICD-10-CM

## 2020-12-23 DIAGNOSIS — R7989 Other specified abnormal findings of blood chemistry: Secondary | ICD-10-CM

## 2020-12-23 DIAGNOSIS — J9621 Acute and chronic respiratory failure with hypoxia: Secondary | ICD-10-CM | POA: Diagnosis present

## 2020-12-23 DIAGNOSIS — Z20822 Contact with and (suspected) exposure to covid-19: Secondary | ICD-10-CM | POA: Diagnosis present

## 2020-12-23 DIAGNOSIS — I25810 Atherosclerosis of coronary artery bypass graft(s) without angina pectoris: Secondary | ICD-10-CM | POA: Diagnosis present

## 2020-12-23 DIAGNOSIS — Z881 Allergy status to other antibiotic agents status: Secondary | ICD-10-CM | POA: Diagnosis not present

## 2020-12-23 DIAGNOSIS — J441 Chronic obstructive pulmonary disease with (acute) exacerbation: Secondary | ICD-10-CM

## 2020-12-23 DIAGNOSIS — F419 Anxiety disorder, unspecified: Secondary | ICD-10-CM | POA: Diagnosis present

## 2020-12-23 DIAGNOSIS — R5381 Other malaise: Secondary | ICD-10-CM | POA: Diagnosis present

## 2020-12-23 DIAGNOSIS — R0602 Shortness of breath: Secondary | ICD-10-CM | POA: Diagnosis not present

## 2020-12-23 DIAGNOSIS — R509 Fever, unspecified: Secondary | ICD-10-CM | POA: Diagnosis not present

## 2020-12-23 DIAGNOSIS — I248 Other forms of acute ischemic heart disease: Secondary | ICD-10-CM | POA: Diagnosis present

## 2020-12-23 DIAGNOSIS — A419 Sepsis, unspecified organism: Secondary | ICD-10-CM

## 2020-12-23 DIAGNOSIS — Z716 Tobacco abuse counseling: Secondary | ICD-10-CM | POA: Diagnosis not present

## 2020-12-23 DIAGNOSIS — G43909 Migraine, unspecified, not intractable, without status migrainosus: Secondary | ICD-10-CM | POA: Diagnosis present

## 2020-12-23 LAB — CBC WITH DIFFERENTIAL/PLATELET
Abs Immature Granulocytes: 0.05 10*3/uL (ref 0.00–0.07)
Basophils Absolute: 0 10*3/uL (ref 0.0–0.1)
Basophils Relative: 0 %
Eosinophils Absolute: 0 10*3/uL (ref 0.0–0.5)
Eosinophils Relative: 0 %
HCT: 41.4 % (ref 36.0–46.0)
Hemoglobin: 13.2 g/dL (ref 12.0–15.0)
Immature Granulocytes: 0 %
Lymphocytes Relative: 2 %
Lymphs Abs: 0.3 10*3/uL — ABNORMAL LOW (ref 0.7–4.0)
MCH: 31.8 pg (ref 26.0–34.0)
MCHC: 31.9 g/dL (ref 30.0–36.0)
MCV: 99.8 fL (ref 80.0–100.0)
Monocytes Absolute: 0.9 10*3/uL (ref 0.1–1.0)
Monocytes Relative: 6 %
Neutro Abs: 12.6 10*3/uL — ABNORMAL HIGH (ref 1.7–7.7)
Neutrophils Relative %: 92 %
Platelets: 200 10*3/uL (ref 150–400)
RBC: 4.15 MIL/uL (ref 3.87–5.11)
RDW: 13.3 % (ref 11.5–15.5)
WBC: 13.9 10*3/uL — ABNORMAL HIGH (ref 4.0–10.5)
nRBC: 0 % (ref 0.0–0.2)

## 2020-12-23 LAB — URINALYSIS, ROUTINE W REFLEX MICROSCOPIC
Bacteria, UA: NONE SEEN
Bilirubin Urine: NEGATIVE
Glucose, UA: NEGATIVE mg/dL
Hgb urine dipstick: NEGATIVE
Ketones, ur: NEGATIVE mg/dL
Leukocytes,Ua: NEGATIVE
Nitrite: NEGATIVE
Protein, ur: 100 mg/dL — AB
Specific Gravity, Urine: 1.014 (ref 1.005–1.030)
pH: 7 (ref 5.0–8.0)

## 2020-12-23 LAB — BASIC METABOLIC PANEL
Anion gap: 8 (ref 5–15)
BUN: 17 mg/dL (ref 8–23)
CO2: 33 mmol/L — ABNORMAL HIGH (ref 22–32)
Calcium: 9.2 mg/dL (ref 8.9–10.3)
Chloride: 100 mmol/L (ref 98–111)
Creatinine, Ser: 1.08 mg/dL — ABNORMAL HIGH (ref 0.44–1.00)
GFR, Estimated: 54 mL/min — ABNORMAL LOW (ref >60)
Glucose, Bld: 91 mg/dL (ref 70–99)
Potassium: 4 mmol/L (ref 3.5–5.1)
Sodium: 141 mmol/L (ref 135–145)

## 2020-12-23 LAB — TROPONIN I (HIGH SENSITIVITY)
Troponin I (High Sensitivity): 31 ng/L — ABNORMAL HIGH (ref <18)
Troponin I (High Sensitivity): 92 ng/L — ABNORMAL HIGH (ref <18)
Troponin I (High Sensitivity): 95 ng/L — ABNORMAL HIGH (ref <18)
Troponin I (High Sensitivity): 87 ng/L — ABNORMAL HIGH (ref <18)

## 2020-12-23 LAB — COMPREHENSIVE METABOLIC PANEL
ALT: 14 U/L (ref 0–44)
AST: 27 U/L (ref 15–41)
Albumin: 4.1 g/dL (ref 3.5–5.0)
Alkaline Phosphatase: 46 U/L (ref 38–126)
Anion gap: 10 (ref 5–15)
BUN: 17 mg/dL (ref 8–23)
CO2: 30 mmol/L (ref 22–32)
Calcium: 9.4 mg/dL (ref 8.9–10.3)
Chloride: 98 mmol/L (ref 98–111)
Creatinine, Ser: 1.18 mg/dL — ABNORMAL HIGH (ref 0.44–1.00)
GFR, Estimated: 48 mL/min — ABNORMAL LOW (ref >60)
Glucose, Bld: 85 mg/dL (ref 70–99)
Potassium: 4.6 mmol/L (ref 3.5–5.1)
Sodium: 138 mmol/L (ref 135–145)
Total Bilirubin: 0.3 mg/dL (ref 0.3–1.2)
Total Protein: 7.4 g/dL (ref 6.5–8.1)

## 2020-12-23 LAB — APTT: aPTT: 26 seconds (ref 24–36)

## 2020-12-23 LAB — RESP PANEL BY RT-PCR (FLU A&B, COVID) ARPGX2
Influenza A by PCR: NEGATIVE
Influenza B by PCR: NEGATIVE
SARS Coronavirus 2 by RT PCR: NEGATIVE

## 2020-12-23 LAB — PROTIME-INR
INR: 1 (ref 0.8–1.2)
Prothrombin Time: 12.3 seconds (ref 11.4–15.2)

## 2020-12-23 LAB — LACTIC ACID, PLASMA
Lactic Acid, Venous: 1.2 mmol/L (ref 0.5–1.9)
Lactic Acid, Venous: 1 mmol/L (ref 0.5–1.9)

## 2020-12-23 MED ORDER — MOMETASONE FURO-FORMOTEROL FUM 200-5 MCG/ACT IN AERO
2.0000 | INHALATION_SPRAY | Freq: Two times a day (BID) | RESPIRATORY_TRACT | Status: DC
  Administered 2020-12-23 – 2020-12-25 (×5): 2 via RESPIRATORY_TRACT
  Filled 2020-12-23: qty 8.8

## 2020-12-23 MED ORDER — ALPRAZOLAM 1 MG PO TABS
1.0000 mg | ORAL_TABLET | Freq: Three times a day (TID) | ORAL | Status: DC | PRN
  Administered 2020-12-23 – 2020-12-24 (×3): 1 mg via ORAL
  Filled 2020-12-23 (×3): qty 1

## 2020-12-23 MED ORDER — PANTOPRAZOLE SODIUM 40 MG PO TBEC
40.0000 mg | DELAYED_RELEASE_TABLET | Freq: Every day | ORAL | Status: DC
  Administered 2020-12-23 – 2020-12-25 (×3): 40 mg via ORAL
  Filled 2020-12-23 (×3): qty 1

## 2020-12-23 MED ORDER — ASPIRIN EC 81 MG PO TBEC
81.0000 mg | DELAYED_RELEASE_TABLET | Freq: Every day | ORAL | Status: DC
  Administered 2020-12-23 – 2020-12-25 (×3): 81 mg via ORAL
  Filled 2020-12-23 (×3): qty 1

## 2020-12-23 MED ORDER — DICLOFENAC SODIUM 1 % TD GEL
4.0000 g | TRANSDERMAL | Status: DC | PRN
  Filled 2020-12-23: qty 100

## 2020-12-23 MED ORDER — VITAMIN D 25 MCG (1000 UNIT) PO TABS
1000.0000 [IU] | ORAL_TABLET | Freq: Every day | ORAL | Status: DC
  Administered 2020-12-23 – 2020-12-25 (×3): 1000 [IU] via ORAL
  Filled 2020-12-23 (×3): qty 1

## 2020-12-23 MED ORDER — ENOXAPARIN SODIUM 40 MG/0.4ML ~~LOC~~ SOLN
40.0000 mg | SUBCUTANEOUS | Status: DC
  Administered 2020-12-23 – 2020-12-25 (×3): 40 mg via SUBCUTANEOUS
  Filled 2020-12-23 (×3): qty 0.4

## 2020-12-23 MED ORDER — LEVOFLOXACIN IN D5W 750 MG/150ML IV SOLN
750.0000 mg | Freq: Once | INTRAVENOUS | Status: AC
  Administered 2020-12-23: 750 mg via INTRAVENOUS

## 2020-12-23 MED ORDER — LEVOFLOXACIN IN D5W 750 MG/150ML IV SOLN
750.0000 mg | INTRAVENOUS | Status: DC
  Administered 2020-12-25: 750 mg via INTRAVENOUS
  Filled 2020-12-23: qty 150

## 2020-12-23 MED ORDER — IPRATROPIUM BROMIDE HFA 17 MCG/ACT IN AERS: 2.0000 | INHALATION_SPRAY | Freq: Four times a day (QID) | RESPIRATORY_TRACT | Status: DC | PRN

## 2020-12-23 MED ORDER — ASCORBIC ACID 500 MG PO TABS
500.0000 mg | ORAL_TABLET | Freq: Every day | ORAL | Status: DC
  Administered 2020-12-23 – 2020-12-25 (×3): 500 mg via ORAL
  Filled 2020-12-23 (×3): qty 1

## 2020-12-23 MED ORDER — BUTALBITAL-APAP-CAFFEINE 50-325-40 MG PO TABS: 1.0000 | ORAL_TABLET | ORAL | Status: DC | PRN

## 2020-12-23 MED ORDER — ONDANSETRON HCL 4 MG PO TABS: 8.0000 mg | ORAL_TABLET | Freq: Three times a day (TID) | ORAL | Status: DC | PRN

## 2020-12-23 MED ORDER — LACTATED RINGERS IV BOLUS (SEPSIS)
1000.0000 mL | Freq: Once | INTRAVENOUS | Status: AC
  Administered 2020-12-23: 1000 mL via INTRAVENOUS

## 2020-12-23 MED ORDER — LINACLOTIDE 72 MCG PO CAPS
72.0000 ug | ORAL_CAPSULE | Freq: Every day | ORAL | Status: DC
  Administered 2020-12-23 – 2020-12-25 (×3): 72 ug via ORAL
  Filled 2020-12-23 (×4): qty 1

## 2020-12-23 MED ORDER — POTASSIUM CHLORIDE CRYS ER 20 MEQ PO TBCR
20.0000 meq | EXTENDED_RELEASE_TABLET | Freq: Every day | ORAL | Status: DC
  Administered 2020-12-23 – 2020-12-25 (×3): 20 meq via ORAL
  Filled 2020-12-23 (×3): qty 1

## 2020-12-23 MED ORDER — NORTRIPTYLINE HCL 25 MG PO CAPS
50.0000 mg | ORAL_CAPSULE | Freq: Every day | ORAL | Status: DC
  Administered 2020-12-23 – 2020-12-24 (×2): 50 mg via ORAL
  Filled 2020-12-23 (×2): qty 2

## 2020-12-23 MED ORDER — LEVOFLOXACIN IN D5W 750 MG/150ML IV SOLN
INTRAVENOUS | Status: AC
  Filled 2020-12-23: qty 150

## 2020-12-23 MED ORDER — IPRATROPIUM-ALBUTEROL 0.5-2.5 (3) MG/3ML IN SOLN
3.0000 mL | RESPIRATORY_TRACT | Status: DC | PRN
  Administered 2020-12-24: 3 mL via RESPIRATORY_TRACT
  Filled 2020-12-23: qty 3

## 2020-12-23 MED ORDER — IPRATROPIUM-ALBUTEROL 0.5-2.5 (3) MG/3ML IN SOLN
3.0000 mL | Freq: Once | RESPIRATORY_TRACT | Status: AC
  Administered 2020-12-23: 3 mL via RESPIRATORY_TRACT
  Filled 2020-12-23: qty 3

## 2020-12-23 MED ORDER — LORATADINE 10 MG PO TABS
10.0000 mg | ORAL_TABLET | Freq: Every evening | ORAL | Status: DC
  Administered 2020-12-23 – 2020-12-24 (×2): 10 mg via ORAL
  Filled 2020-12-23 (×2): qty 1

## 2020-12-23 MED ORDER — POLYETHYLENE GLYCOL 3350 17 G PO PACK: 17.0000 g | PACK | Freq: Every day | ORAL | Status: DC | PRN

## 2020-12-23 MED ORDER — PRAVASTATIN SODIUM 20 MG PO TABS
40.0000 mg | ORAL_TABLET | Freq: Every day | ORAL | Status: DC
  Administered 2020-12-23 – 2020-12-24 (×2): 40 mg via ORAL
  Filled 2020-12-23 (×2): qty 2

## 2020-12-23 MED ORDER — LACTATED RINGERS IV SOLN: INTRAVENOUS | Status: DC

## 2020-12-23 MED ORDER — LEVOFLOXACIN IN D5W 750 MG/150ML IV SOLN: 750.0000 mg | INTRAVENOUS | Status: DC

## 2020-12-23 MED ORDER — LACTULOSE ENCEPHALOPATHY 10 GM/15ML PO SOLN
30.0000 g | Freq: Three times a day (TID) | ORAL | Status: DC | PRN
  Administered 2020-12-23: 30 g via ORAL
  Filled 2020-12-23 (×3): qty 45

## 2020-12-23 MED ORDER — PREDNISONE 10 MG PO TABS
30.0000 mg | ORAL_TABLET | Freq: Every day | ORAL | Status: DC
  Administered 2020-12-23 – 2020-12-25 (×3): 30 mg via ORAL
  Filled 2020-12-23 (×3): qty 1

## 2020-12-23 MED ORDER — MONTELUKAST SODIUM 10 MG PO TABS
10.0000 mg | ORAL_TABLET | Freq: Every day | ORAL | Status: DC
  Administered 2020-12-23 – 2020-12-24 (×2): 10 mg via ORAL
  Filled 2020-12-23 (×2): qty 1

## 2020-12-23 NOTE — ED Triage Notes (Signed)
Patient arrives via EMS from home with complaint of chills and SOB x 1 day. Patient wears O2 3 L/min at home and states it has been helping with the SOB. Patient reports getting bronchitis and pneumonia around this time every year.    EMS vitals:  BP 160/96 T 101 RR 18  HR 75

## 2020-12-23 NOTE — Sepsis Progress Note (Signed)
Following for sepsis monitoring ?

## 2020-12-23 NOTE — H&P (Signed)
History and Physical    [name] PQZ:[id number] DOB: [birth year] DOA: 12/22/2020  PCP: [name], MD   Patient coming from: Home  Chief Complaint: SOB, fever  HPI: [name] is a 75 y.o. female with medical history significant for CHF, COPD, CAD presents with  complaint of fever with SOB and cough.  She reports she developed fever and chills yesterday afternoon that was associated with increased shortness of breath. She has a dry cough which is occurring more frequently than her baseline cough.  Also having some back pain radiating into the left arm. Took aspirin which seemed to help.  Denies headache, no abdominal pain, no nausea or vomiting, dysuria, swelling of legs. She denies any substernal chest pain or pressure. She uses oxygen at home at 3 L/min but has needed more oxygen since last evening. She used her nebulizer at home but did not get significant relief.  Continues to smoke 7-8 cigarettes a day.  ED Course: Elevated white blood cell count of 13,000.  Chest x-ray shows a right basilar infiltrate.  She had a fever to 101 degrees.  Patient had mild tachycardia and tachypnea in the emergency room but is otherwise hemodynamically stable.  Review of Systems:  General: Reports fever, chills. No weight loss, night sweats.  Denies dizziness.  Denies change in appetite HENT: Denies head trauma, headache, denies change in hearing, tinnitus.  Denies nasal bleeding.  Denies sore throat, sores in mouth.  Denies difficulty swallowing Eyes: Denies blurry vision, pain in eye, drainage.  Denies discoloration of eyes. Neck: Denies pain.  Denies swelling.  Denies pain with movement. Cardiovascular: Denies chest pain, palpitations.  Denies edema.  Denies orthopnea Respiratory: Reports shortness of breath with nonproductive cough. Reports wheezing.  Denies sputum production Gastrointestinal: Denies abdominal pain, swelling. Denies nausea, vomiting, diarrhea. Denies melena Denies  hematemesis. Musculoskeletal: Denies limitation of movement.  Denies deformity or swelling.  Denies pain. Genitourinary: Denies pelvic pain.  Denies urinary frequency or hesitancy.  Denies dysuria.  Skin: Denies rash.  Denies petechiae, purpura, ecchymosis. Neurological: Denies headache.  Denies syncope.Denies seizure activity. Denies paresthesia. Denies slurred speech, drooping face.  Denies visual change. Psychiatric: Denies depression, anxiety.  Denies hallucinations.  Past Medical History:  Diagnosis Date  . Abnormal LFTs 08/02/2011  . Acute liver failure 06/19/2013  . Acute renal failure ([place]) 06/19/2013  . Acute respiratory failure ([place]) 06/19/2013  . Altered mental status 08/01/2011  . Angina   . Anxiety   . Anxiety state, unspecified 12/03/2013  . Aortic stenosis    mild  . Arthritis    "hands" (02/03/2017)  . Breast cancer, right breast ([place]) dx'd 2008  . CAD (coronary artery disease) of artery bypass graft 11/06/2013  . CAD (coronary artery disease), MI R/O 08/01/2011   PCI in 2006 (bare metal stent, unknown artery) - [place]   . CAP (community acquired pneumonia) 09/11/2018  . CHF,  acute diastolic, BNP 4k on admissio 08/01/2011  . Chronic bronchitis ([place])   . Chronic respiratory failure ([place]) 02/02/2012  . Compression fracture of L1 lumbar vertebra ([place]) 02/02/2012  . COPD (chronic obstructive pulmonary disease) (H[facility])    oxygen-dependent 4LPM [place]  . Coronary artery disease 2006   2 stents w/previous MI  . Depressed   . Diastolic CHF ([place])   . Dyslipidemia   . Family history of adverse reaction to anesthesia    "daughter had c-section; missed twice w/epidural"  . GERD (gastroesophageal reflux disease)   . Heart murmur   .  History of blood transfusion    "w/my colon OR"  . 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 History of nuclear stress test 08/03/2011   attenuation at apex - no perfusion defects   . HTN (hypertension) 11/06/2013  . Hyperkalemia, on ACE prior to admission  11/11/2011  . Hypertension   . Hyponatremia 01/31/2012  . Migraine    "qod to q couple months since I was 21" (02/03/2017)  . Mild aortic stenosis 08/01/2011   AVA 1.69 cm2 (06/02/2011)   . Moderate to severe pulmonary hypertension ([place])   . NSTEMI (non-ST elevated myocardial infarction) ([place]) 2006  . NSVT (nonsustained ventricular tachycardia) (HCC)    h/o  . On home oxygen therapy    "3L just at night; have it available prn duringtheday" (09/11/2018)  . Pneumonia    "alot of times" (02/03/2017)  . SBO (small bowel obstruction) ([place]) 04/23/2020   PARTIAL     Past Surgical History:  Procedure Laterality Date  . BREAST BIOPSY Right 2008  . BREAST LUMPECTOMY Right 2008   malignant  . CATARACT EXTRACTION W/ INTRAOCULAR LENS  IMPLANT, BILATERAL Bilateral ~ 2013  . [place]; 1971; 1978  . CHOLECYSTECTOMY OPEN  2011  . COLON SURGERY    . COLOSTOMY  11/2007  . COLOSTOMY CLOSURE  07/2008  . CORONARY ANGIOPLASTY WITH STENT PLACEMENT  2006   "2 stents"  . ERCP N/A 09/05/2015   Procedure: ENDOSCOPIC RETROGRADE CHOLANGIOPANCREATOGRAPHY (ERCP);  Surgeon: Ladene Artist, MD;  Location: [facility];  Service: Endoscopy;  Laterality: N/A;  . PARTIAL COLECTOMY  2009   for obstruction: temporary ostomy, later reversed.   Marland Kitchen RIGHT HEART CATHETERIZATION N/A 09/05/2013   Procedure: RIGHT HEART CATH;  Surgeon: Jolaine Artist, MD;  Location[facility];  Service: Cardiovascular;  Laterality: N/A;  . TRANSTHORACIC ECHOCARDIOGRAM  11/12/2011   EF 19-37%, normal systolic function, grade 1 diastolic dysfunction; ventricular septal flattening (D-sign); mild AS; trace-mild MR; LA mildly dilated; RV mod dilated; RA mod dilated; severe pulm HTN; elevated CVP    Social History  reports that she has been smoking cigarettes. She has a 26.50 pack-year smoking history. She has never used smokeless tobacco. She reports current alcohol use of about 1.0 standard drink of alcohol per week. She reports  that she does not use drugs.  Allergies  Allergen Reactions  . Ceftriaxone Anaphylaxis and Other (See Comments)    *ROCEPHIN*  "Blow up like a balloon"  . Hydroxyzine Shortness Of Breath and Other (See Comments)    Pt states med make her light headed, get sob sxs  . Doxycycline Nausea And Vomiting  . Lexapro [Escitalopram] Other (See Comments)    Pt states med make her dizzy    Family History  Problem Relation Age of Onset  . Schizophrenia Sister   . Alzheimer's disease Mother   . Hyperlipidemia Brother   . Heart disease Sister   . Diabetes Sister   . Cancer Neg Hx   . Stroke Neg Hx   . COPD Neg Hx   . Depression Neg Hx   . Drug abuse Neg Hx   . Early death Neg Hx   . Hypertension Neg Hx   . Kidney disease Neg Hx      Prior to Admission medications   Medication Sig Start Date End Date Taking? Authorizing Provider  ALPRAZolam Duanne Moron) 1 MG tablet TAKE 1 TABLET BY MOUTH THREE TIMES A DAY AS NEEDED 07/15/20  Yes [name], MD  ATROVENT HFA 17 MCG/ACT inhaler INHALE 2 PUFFS BY MOUTH EVERY 4 HOURS AS NEEDED FOR WHEEZE 06/13/20  Yes [name], MD  butalbital-acetaminophen-caffeine (FIORICET) 50-325-40 MG tablet TAKE 1 TABLET BY MOUTH EVERY 4 (FOUR) HOURS AS NEEDED FOR HEADACHE. 10/29/20  Yes [name], MD  cholecalciferol (VITAMIN D) 1000 units tablet Take 1,000 Units by mouth daily.   Yes [provider]  denosumab ([name]) 60 MG/ML SOSY injection Inject 60 mg into the skin every 6 (six) months. 09/17/20  Yes [name], MD  diclofenac sodium (VOLTAREN) 1 % GEL Apply 4 g topically as needed (for pain). 01/14/19  Yes [name], MD  ipratropium-albuterol (DUONEB) 0.5-2.5 (3) MG/3ML SOLN Inhale 3 mLs into the lungs 2 (two) times daily as needed (for SOB). 05/01/19  Yes [name], MD  KLOR-CON M20 20 MEQ tablet TAKE 1 TABLET BY MOUTH EVERY DAY 09/15/20  Yes [name], MD  lactulose, encephalopathy, (CHRONULAC) 10 GM/15ML SOLN Take 45 mLs (30  g total) by mouth See admin instructions. TAKE 45 MILLILITERS BY MOUTH 3 TIMES A DAY AS NEEDED FOR CONSTIPATION 07/25/20  Yes [name], MD  levocetirizine (XYZAL) 5 MG tablet Take 1 tablet (5 mg total) by mouth every evening. 12/08/20  Yes [name], MD  linaclotide [facility]) 72 MCG capsule Take 1 capsule (72 mcg total) by mouth daily before breakfast. 07/28/20  Yes [name], MD  LIVALO 2 MG TABS TAKE 1 TABLET BY MOUTH EVERY DAY 09/15/20  Yes [name], MD  montelukast (SINGULAIR) 10 MG tablet TAKE 1 TABLET BY MOUTH EVERYDAY AT BEDTIME 09/15/20  Yes [name], MD  nortriptyline (PAMELOR) 25 MG capsule TAKE 2 CAPSULES BY MOUTH AT BEDTIME 11/15/20  Yes [name], MD  Omega 3 1200 MG CAPS Take 1,200 mg by mouth every morning.    Yes [provider]  ondansetron (ZOFRAN) 8 MG tablet TAKE 1 TABLET BY MOUTH EVERY 8 HOURS AS NEEDED FOR NAUSEA Patient taking differently: Take 8 mg by mouth every 8 (eight) hours as needed for nausea. 11/24/18  Yes [name], MD  OXYGEN-HELIUM IN Inhale 3 L into the lungs See admin instructions. Uses when needed during the day, uses continuous throughout the night   Yes [provider]  pantoprazole (PROTONIX) 40 MG tablet TAKE 1 TABLET BY MOUTH EVERY DAY 11/15/20  Yes [name], MD  polyethylene glycol (MIRALAX / GLYCOLAX) 17 g packet Take 17 g by mouth daily as needed for severe constipation (if not improved with lactulose). 04/25/20  Yes Pokhrel, Corrie Mckusick, MD  SYMBICORT 160-4.5 MCG/ACT inhaler INHALE 2 PUFFS INTO THE LUNGS TWICE A DAY 11/15/20  Yes [name], MD  VENTOLIN HFA 108 (90 Base) MCG/ACT inhaler INHALE 2 PUFFS INTO THE LUNGS EVERY 4 HOURS AS NEEDED FOR WHEEZE OR FOR SHORTNESS OF BREATH 10/17/20  Yes [name], MD  vitamin C (ASCORBIC ACID) 500 MG tablet Take 500 mg by mouth daily.   Yes [provider]  fluticasone (FLONASE) 50 MCG/ACT nasal spray USE 2 SPRAYS INTO EACH NOSTRIL ONCE  DAILY**REPEAT FOR 5 DAYS THEN STOP Patient not taking: Reported on 12/23/2020 04/18/16   [name], MD    Physical Exam: Vitals:   12/23/20 0200 12/23/20 0230 12/23/20 0300 12/23/20 0409  BP: (!) 141/67 137/66 (!) 118/103 124/64  Pulse: (!) 105 (!) 107 (!) 101 97  Resp: (!) 23 20 (!) 22 (!) 21  Temp:      TempSrc:      SpO2: 97% 97% 98% 98%  Weight:      Height:        Constitutional: NAD, calm, comfortable Vitals:   12/23/20 0200 12/23/20 0230 12/23/20 0300 12/23/20 0409  BP: (!) 141/67 137/66 (!) 118/103 124/64  Pulse: (!) 105 (!) 107 (!) 101 97  Resp: (!) 23 20 (!) 22 (!) 21  Temp:      TempSrc:      SpO2: 97% 97% 98% 98%  Weight:      Height:       General: WDWN, Alert and oriented x3. Chronically ill appearing Eyes: EOMI, PERRL, conjunctivae normal.  Sclera nonicteric HENT:  [place]/AT, external ears normal.  Nares patent without epistasis.  Mucous membranes are moist.  Neck: Soft, normal range of motion, supple, no masses, no thyromegaly. Trachea midline Respiratory: Equal breath sounds but diminished.  Diffuse holes.  Right lower lobe rhonchi.  Diffuse expiratory wheezing.  No crackles. Normal respiratory effort. No accessory muscle use.  Cardiovascular: Regular rate and rhythm, no murmurs / rubs / gallops. No extremity edema. 2+ pedal pulses. Abdomen: Soft, no tenderness, nondistended, no rebound or guarding.  No masses palpated. Bowel sounds normoactive Musculoskeletal: FROM. Has clubbing of digits. No cyanosis. No joint deformity upper and lower extremities. Normal muscle tone.  Skin: Warm, dry, intact no rashes, lesions, ulcers. No induration Neurologic: CN 2-12 grossly intact.  Normal speech.  Sensation intact. Strength 5/5 in all extremities.   Psychiatric: Normal judgment and insight.  Normal mood.    Labs on Admission: I have personally reviewed following labs and imaging studies  CBC: Recent Labs  Lab 12/23/20 0141  WBC 13.9*  NEUTROABS 12.6*  HGB 13.2   HCT 41.4  MCV 99.8  PLT 782    Basic Metabolic Panel: Recent Labs  Lab 12/23/20 0141  NA 138  K 4.6  CL 98  CO2 30  GLUCOSE 85  BUN 17  CREATININE 1.18*  CALCIUM 9.4    GFR: Estimated Creatinine Clearance: 32.9 mL/min (A) (by C-G formula based on SCr of 1.18 mg/dL (H)).  Liver Function Tests: Recent Labs  Lab 12/23/20 0141  AST 27  ALT 14  ALKPHOS 46  BILITOT 0.3  PROT 7.4  ALBUMIN 4.1    Urine analysis:    Component Value Date/Time   COLORURINE YELLOW 12/23/2020 0055   APPEARANCEUR CLEAR 12/23/2020 0055   LABSPEC 1.014 12/23/2020 0055   PHURINE 7.0 12/23/2020 0055   GLUCOSEU NEGATIVE 12/23/2020 0055   GLUCOSEU NEGATIVE 12/19/2013 1127   HGBUR NEGATIVE 12/23/2020 0055   BILIRUBINUR NEGATIVE 12/23/2020 0055   KETONESUR NEGATIVE 12/23/2020 0055   PROTEINUR 100 (A) 12/23/2020 0055   UROBILINOGEN 0.2 12/19/2013 1127   NITRITE NEGATIVE 12/23/2020 0055   LEUKOCYTESUR NEGATIVE 12/23/2020 0055    Radiological Exams on Admission: DG Chest Port 1 View  Result Date: 12/23/2020 CLINICAL DATA:  Shortness of breath and weakness EXAM: PORTABLE CHEST 1 VIEW COMPARISON:  04/22/2020 FINDINGS: Cardiac shadow is stable. Aortic calcifications are noted. Left lung is well aerated and clear. Right lung demonstrates patchy basilar infiltrate new from the prior exam. Postsurgical changes in the right axilla are noted. IMPRESSION: Right basilar infiltrate. Electronically Signed   By: Inez Catalina M.D.   On: 12/23/2020 01:23    EKG: Independently reviewed.  EKG shows sinus tachycardia with no acute ST elevation or depression.  QTc 411  Assessment/Plan Principal Problem:   CAP (community acquired  pneumonia) [name] is admitted to telemetry unit. Has pnuemonia in right lower lobe. Levaquin for antibiotic therapy.  Patient has had Levaquin multiple times in the last few years without side effects or reaction. Supplemental oxygen to keep O2 sat between 92 to 96%  Active  Problems:   COPD with acute exacerbation Number will be changed to [facility] per hospital formulary.  Will use Dulera twice a day with spacer.  DuoNeb provided every 4 hours as needed for shortness of breath, cough, wheeze Incentive spirometer every 2 hours while awake     Sepsis She meets sepsis criteria with fever, elevated white blood cell count, tachycardia, tachypnea.  Is hemodynamically stable at this time we will continue to monitor.  Patient been placed on antibiotic therapy for pneumonia    Chronic respiratory failure with hypoxia  Pt uses oxygen at home at 3 L/min. Is requiring 4-5 L/min at this time.  Incentive spirometer every two hours while awake.     HTN (hypertension) Monitor BP. Pt is not on antihypertensives at home    Chronic renal disease, stage 3 Stable. Recheck electrolytes and renal function with labs in am.    Elevated troponin level Check serial troponin values overnight.     Coronary artery disease involving native coronary artery of native heart without angina pectoris Continue statin.     Oxygen dependent Oxygen by N/C    DVT prophylaxis: Lovenox for DVT prophylaxis Code Status:   Full code Family Communication:  Diagnosis and plan discussed with patient.  Questions answered.  Further recommendations to follow as clinically indicated Disposition Plan:   Patient is from:  Home  Anticipated DC to:  Home  Anticipated DC date:  Anticipate 2 midnight or more stay in the hospital to treat acute condition  Anticipated DC barriers: No  barriers to discharge identified at this time  Admission status:  Inpatient   Barbara Aline Fulton Merry MD [facility]  How to contact the [facility] Attending or Consulting provider [place] or covering provider during after hours [place], for this patient?   1. Check the care team in [facility] and look for a) attending/consulting [facility] provider listed and b) the [facility] team listed 2. Log into [email or web address] and use [place]'s universal  password to access. If you do not have the password, please contact the hospital operator. 3. Locate the [facility] provider you are looking for under [facility] and page to a number that you can be directly reached. 4. If you still have difficulty reaching the provider, please page the [facility] (Director on Call) for the Hospitalists listed on amion for assistance.  12/23/2020, 4:36 AM

## 2020-12-23 NOTE — ED Provider Notes (Signed)
[facility] Emergency Department Provider Note MRN:  [id number]  Arrival date & time: 12/23/20     Chief Complaint   Chills, Shortness of Breath (/), Fatigue, and Dizziness   History of Present Illness   [name] is a 75 y.o. year-old female with a history of CHF, COPD, CAD presenting to the ED with chief complaint of fever.  Fever and chills today associated with increased shortness of breath.  Also having some back pain radiating into the left arm.  Took aspirin for this.  Denies headache, no abdominal pain, no other complaints.  Some increased cough.  Thinks that she may have been taking some Levaquin for the past few days.  Review of Systems  A complete 10 system review of systems was obtained and all systems are negative except as noted in the HPI and PMH.   Patient's Health History    Past Medical History:  Diagnosis Date  . Abnormal LFTs 08/02/2011  . Acute liver failure 06/19/2013  . Acute renal failure ([place]) 06/19/2013  . Acute respiratory failure ([place]) 06/19/2013  . Altered mental status 08/01/2011  . Angina   . Anxiety   . Anxiety state, unspecified 12/03/2013  . Aortic stenosis    mild  . Arthritis    "hands" (02/03/2017)  . Breast cancer, right breast ([place]) dx'd 2008  . CAD (coronary artery disease) of artery bypass graft 11/06/2013  . CAD (coronary artery disease), MI R/O 08/01/2011   PCI in 2006 (bare metal stent, unknown artery) - [place]   . CAP (community acquired pneumonia) 09/11/2018  . CHF,  acute diastolic, BNP 4k on admissio 08/01/2011  . Chronic bronchitis ([place])   . Chronic respiratory failure ([place]) 02/02/2012  . Compression fracture of L1 lumbar vertebra ([place]) 02/02/2012  . COPD (chronic obstructive pulmonary disease) (H[facility])    oxygen-dependent 4LPM [place]  . Coronary artery disease 2006   2 stents w/previous MI  . Depressed   . Diastolic CHF ([place])   . Dyslipidemia   . Family history of adverse reaction to anesthesia    "daughter had  c-section; missed twice w/epidural"  . GERD (gastroesophageal reflux disease)   . Heart murmur   . History of blood transfusion    "w/my colon OR"  . 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 History of nuclear stress test 08/03/2011   attenuation at apex - no perfusion defects   . HTN (hypertension) 11/06/2013  . Hyperkalemia, on ACE prior to admission 11/11/2011  . Hypertension   . Hyponatremia 01/31/2012  . Migraine    "qod to q couple months since I was 21" (02/03/2017)  . Mild aortic stenosis 08/01/2011   AVA 1.69 cm2 (06/02/2011)   . Moderate to severe pulmonary hypertension ([place])   . NSTEMI (non-ST elevated myocardial infarction) ([place]) 2006  . NSVT (nonsustained ventricular tachycardia) (HCC)    h/o  . On home oxygen therapy    "3L just at night; have it available prn duringtheday" (09/11/2018)  . Pneumonia    "alot of times" (02/03/2017)  . SBO (small bowel obstruction) ([place]) 04/23/2020   PARTIAL     Past Surgical History:  Procedure Laterality Date  . BREAST BIOPSY Right 2008  . BREAST LUMPECTOMY Right 2008   malignant  . CATARACT EXTRACTION W/ INTRAOCULAR LENS  IMPLANT, BILATERAL Bilateral ~ 2013  . [place]; 1971; 1978  . CHOLECYSTECTOMY OPEN  2011  . COLON SURGERY    . COLOSTOMY  11/2007  .  COLOSTOMY CLOSURE  07/2008  . CORONARY ANGIOPLASTY WITH STENT PLACEMENT  2006   "2 stents"  . ERCP N/A 09/05/2015   Procedure: ENDOSCOPIC RETROGRADE CHOLANGIOPANCREATOGRAPHY (ERCP);  Surgeon: Ladene Artist, MD;  Location: [facility];  Service: Endoscopy;  Laterality: N/A;  . PARTIAL COLECTOMY  2009   for obstruction: temporary ostomy, later reversed.   Marland Kitchen RIGHT HEART CATHETERIZATION N/A 09/05/2013   Procedure: RIGHT HEART CATH;  Surgeon: Jolaine Artist, MD;  Location: [facility] CATH LAB;  Service: Cardiovascular;  Laterality: N/A;  . TRANSTHORACIC ECHOCARDIOGRAM  11/12/2011   EF 16-10%, normal systolic function, grade 1 diastolic dysfunction; ventricular septal  flattening (D-sign); mild AS; trace-mild MR; LA mildly dilated; RV mod dilated; RA mod dilated; severe pulm HTN; elevated CVP    Family History  Problem Relation Age of Onset  . Schizophrenia Sister   . Alzheimer's disease Mother   . Hyperlipidemia Brother   . Heart disease Sister   . Diabetes Sister   . Cancer Neg Hx   . Stroke Neg Hx   . COPD Neg Hx   . Depression Neg Hx   . Drug abuse Neg Hx   . Early death Neg Hx   . Hypertension Neg Hx   . Kidney disease Neg Hx     Social History   Socioeconomic History  . Marital status: Widowed    Spouse name: Not on file  . Number of children: 3  . Years of education: 32  . Highest education level: Not on file  Occupational History  . Not on file  Tobacco Use  . Smoking status: Current Some Day Smoker    Packs/day: 0.50    Years: 53.00    Pack years: 26.50    Types: Cigarettes  . Smokeless tobacco: Never Used  Vaping Use  . Vaping Use: Never used  Substance and Sexual Activity  . Alcohol use: Yes    Alcohol/week: 1.0 standard drink    Types: 1 Glasses of wine per week  . Drug use: No  . Sexual activity: Not Currently  Other Topics Concern  . Not on file  Social History Narrative  . Not on file   Social Determinants of Health   Financial Resource Strain: Not on file  Food Insecurity: Not on file  Transportation Needs: Not on file  Physical Activity: Not on file  Stress: Not on file  Social Connections: Not on file  Intimate Partner Violence: Not on file     Physical Exam   Vitals:   12/23/20 0230 12/23/20 0300  BP: 137/66 (!) 118/103  Pulse: (!) 107 (!) 101  Resp: 20 (!) 22  Temp:    SpO2: 97% 98%    CONSTITUTIONAL: Chronically ill-appearing, diaphoretic NEURO:  Alert and oriented x 3, no focal deficits EYES:  eyes equal and reactive ENT/NECK:  no LAD, no JVD CARDIO: Tachycardic rate, well-perfused, normal S1 and S2 PULM: Tachypneic, diffuse rhonchi GI/GU:  normal bowel sounds, non-distended,  non-tender MSK/SPINE:  No gross deformities, no edema SKIN:  no rash, atraumatic PSYCH:  Appropriate speech and behavior  *Additional and/or pertinent findings included in MDM below  Diagnostic and Interventional Summary    EKG Interpretation  Date/Time: December 23, 2020 at 1:52 AM   Ventricular Rate: 106   PR Interval:    QRS Duration: 82   QT Interval:  309 QTC Calculation: 411   R Axis:     Text Interpretation: Sinus tachycardia normal intervals, no ischemic findings Confirmed by Dr. Legrand Como  Daleysa Kristiansen at 3:46 AM      Labs Reviewed  COMPREHENSIVE METABOLIC PANEL - Abnormal; Notable for the following components:      Result Value   Creatinine, Ser 1.18 (*)    GFR, Estimated 48 (*)    All other components within normal limits  CBC WITH DIFFERENTIAL/PLATELET - Abnormal; Notable for the following components:   WBC 13.9 (*)    Neutro Abs 12.6 (*)    Lymphs Abs 0.3 (*)    All other components within normal limits  RESP PANEL BY RT-PCR (FLU A&B, COVID) ARPGX2  CULTURE, BLOOD (ROUTINE X 2)  CULTURE, BLOOD (ROUTINE X 2)  URINE CULTURE  LACTIC ACID, PLASMA  PROTIME-INR  APTT  LACTIC ACID, PLASMA  URINALYSIS, ROUTINE W REFLEX MICROSCOPIC  TROPONIN I (HIGH SENSITIVITY)  TROPONIN I (HIGH SENSITIVITY)    DG Chest Port 1 View  Final Result      Medications  lactated ringers bolus 1,000 mL (1,000 mLs Intravenous New Bag/Given 12/23/20 0310)  levofloxacin (LEVAQUIN) IVPB 750 mg (0 mg Intravenous Stopped 12/23/20 0309)     Procedures  /  Critical Care .Critical Care Performed by: Maudie Flakes, MD Authorized by: Maudie Flakes, MD   Critical care provider statement:    Critical care time (minutes):  35   Critical care was necessary to treat or prevent imminent or life-threatening deterioration of the following conditions:  Sepsis   Critical care was time spent personally by me on the following activities:  Discussions with consultants, evaluation of patient's response to  treatment, examination of patient, ordering and performing treatments and interventions, ordering and review of laboratory studies, ordering and review of radiographic studies, pulse oximetry, re-evaluation of patient's condition, obtaining history from patient or surrogate and review of old charts    ED Course and Medical Decision Making  I have reviewed the triage vital signs, the nursing notes, and pertinent available records from the EMR.  Listed above are laboratory and imaging tests that I personally ordered, reviewed, and interpreted and then considered in my medical decision making (see below for details).  Concern for sepsis of pulmonary origin, patient is febrile, tachycardic, tachypneic, baseline oxygen is 2 to 3 L but here in the emergency department requiring 4-5.  Suspect will need admission.  Code sepsis initiated.      Barth Kirks. [place] Small, [place] [email or web address]  Final Clinical Impressions(s) / ED Diagnoses     ICD-10-CM   1. Community acquired pneumonia, unspecified laterality  J18.9     ED Discharge Orders    None       Discharge Instructions Discussed with and Provided to Patient:   Discharge Instructions   None       Maudie Flakes, MD 12/23/20 [phone]

## 2020-12-23 NOTE — Progress Notes (Addendum)
Same day note  Patient seen and examined at bedside.  Patient was admitted to the hospital for breath cough dyspnea.  At the time of my evaluation, patient complains of little better with breathing but he still has cough denies any chest pain dizziness nausea vomiting.  Physical examination reveals elderly female lying in bed on nasal cannula oxygen, chest with diminished breath sounds.  Mild wheezes.  No leg swelling.  Vitals with BMI 12/23/2020 12/23/2020 12/23/2020  Height - - -  Weight - - -  BMI - - -  Systolic 810 175 102  Diastolic 54 54 80  Pulse 83 83 68    Laboratory data and imaging was reviewed  Assessment and Plan.  Sepsis secondary to community-acquired pneumonia.  Chest x-ray shows right lower lobe pneumonia.  Was on Levaquin in the past for pneumonia with seem to improve.  Has been started on Levaquin from admission.  Continue to monitor closely.  Patient had fever leukocytosis tachycardia tachypnea on presentation with pneumonia as the source of infection.  Blood cultures negative in less than 12 hours.  He was negative except for some protein.  Influenza and Covid negative.  WBC elevated at 13.9.  Lactate of 1.2.  Temperature max of 101 F.  COPD with acute exacerbation with mild acute on chronic hypoxic respiratory failure. Continue inhalers, duo nebs, incentive spirometry.  On 3 L of oxygen at baseline.  Currently requiring 4 to 5 L.  Will wean as able.  We will add prednisone 30 mg daily for now.  History of essential hypertension.  Not on medications at home.  Will closely monitor.    Chronic renal disease, stage 3a Continue to monitor closely.  Likely at baseline.  Mildly elevated troponin, history of CAD. Unlikely to be acute coronary syndrome.  Mild elevation of troponins.  No chest pain.  EKG with some sinus tachycardia but no acute changes.  Continue statins.  Will check 2D echocardiogram to assess for wall motion abnormality.  Add low-dose  aspirin.  Ongoing nicotine use.  Patient was counseled about it.  Does not wish to be on a nicotine patch.  No Charge  Signed,  Delila Pereyra, MD [facility] 12/23/2020

## 2020-12-23 NOTE — ED Notes (Addendum)
Called floor to have accepting RN review SBAR and let me know if any questions remain. RN to inform this RN once she has reviewed SBAR and is ready to accept patient.

## 2020-12-23 NOTE — Progress Notes (Signed)
A consult was received from an ED physician for [place] per pharmacy dosing.    Pharmacist to investigate beta-lactam allergy. If history of intolerance, mild allergy, or documented history of use of cephalosporins, pharmacy can adjust levofloxacin to ceftriaxone and azithromycin.  The patient's profile has been reviewed for ht/wt/allergies/indication/available labs.    Allergy to Ceftriaxone:  Reaction anaphylaxis  A one time order has been placed for Levaquin 750mg  IV.    Further antibiotics/pharmacy consults should be ordered by admitting physician if indicated.                       Thank you, Everette Rank, PharmD 12/23/2020  1:00 AM

## 2020-12-23 NOTE — Progress Notes (Addendum)
PHARMACY NOTE:  ANTIMICROBIAL RENAL DOSAGE ADJUSTMENT  Current antimicrobial regimen includes a mismatch between antimicrobial dosage and estimated renal function.  As per policy approved by the Pharmacy & Therapeutics and Medical Executive Committees, the antimicrobial dosage will be adjusted accordingly.  Current antimicrobial dosage:  Levaquin 750mg  IV q24h x 5 days  Indication: Pneumonia  Renal Function:  Estimated Creatinine Clearance: 32.9 mL/min (A) (by C-G formula based on SCr of 1.18 mg/dL (H)).  Antimicrobial dosage has been changed to:  Levaquin 750mg  IV q48h x 5 days  Need for further dosage adjustment appears unlikely at present.    Will sign off at this time.  Pharmacy will continue to follow patient peripherally and adjust dose according to renal function as needed.    Thank you for allowing pharmacy to be a part of this patient's care.  Everette Rank, [facility] 12/23/2020 4:44 AM

## 2020-12-23 NOTE — ED Notes (Signed)
ED TO INPATIENT HANDOFF REPORT  ED Nurse Name and Phone #: Erick Colace, RN [phone]  S Name/Age/Gender [name] 75 y.o. female Room/Bed: WA08/WA08  Code Status   Code Status: Full Code  Home/SNF/Other Home Patient oriented to: self, place, time and situation Is this baseline? Yes   Triage Complete: Triage complete  Chief Complaint CAP (community acquired pneumonia) [J18.9]  Triage Note Patient arrives via EMS from home with complaint of chills and SOB x 1 day. Patient wears O2 3 L/min at home and states it has been helping with the SOB. Patient reports getting bronchitis and pneumonia around this time every year.    EMS vitals:  BP 160/96 T 101 RR 18  HR 75    Allergies Allergies  Allergen Reactions  . Ceftriaxone Anaphylaxis and Other (See Comments)    *ROCEPHIN*  "Blow up like a balloon"  . Hydroxyzine Shortness Of Breath and Other (See Comments)    Pt states med make her light headed, get sob sxs  . Doxycycline Nausea And Vomiting  . Lexapro [Escitalopram] Other (See Comments)    Pt states med make her dizzy    Level of Care/Admitting Diagnosis ED Disposition    ED Disposition Condition Comment   Admit  Hospital Area: [place] [[id number]]  Level of Care: Telemetry [5]  Admit to tele based on following criteria: Monitor for Ischemic changes  May [facility] patient to [facility] if equivalent level of care is available:: Yes  Covid Evaluation: Confirmed COVID Negative  Diagnosis: CAP (community acquired pneumonia) [902409]  Admitting Physician: Eben Burow [[id number]]  Attending Physician: Eben Burow [[id number]]  Estimated length of stay: past midnight tomorrow  Certification:: I certify this patient will need inpatient services for at least 2 midnights       B Medical/Surgery History Past Medical History:  Diagnosis Date  . Abnormal LFTs 08/02/2011  . Acute liver failure 06/19/2013  . Acute renal  failure ([place]) 06/19/2013  . Acute respiratory failure ([place]) 06/19/2013  . Altered mental status 08/01/2011  . Angina   . Anxiety   . Anxiety state, unspecified 12/03/2013  . Aortic stenosis    mild  . Arthritis    "hands" (02/03/2017)  . Breast cancer, right breast ([place]) dx'd 2008  . CAD (coronary artery disease) of artery bypass graft 11/06/2013  . CAD (coronary artery disease), MI R/O 08/01/2011   PCI in 2006 (bare metal stent, unknown artery) - [place]   . CAP (community acquired pneumonia) 09/11/2018  . CHF,  acute diastolic, BNP 4k on admissio 08/01/2011  . Chronic bronchitis ([place])   . Chronic respiratory failure ([place]) 02/02/2012  . Compression fracture of L1 lumbar vertebra ([place]) 02/02/2012  . COPD (chronic obstructive pulmonary disease) ([facility])    oxygen-dependent 4LPM [place]  . Coronary artery disease 2006   2 stents w/previous MI  . Depressed   . Diastolic CHF ([place])   . Dyslipidemia   . Family history of adverse reaction to anesthesia    "daughter had c-section; missed twice w/epidural"  . GERD (gastroesophageal reflux disease)   . Heart murmur   . History of blood transfusion    "w/my colon OR"  . 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 History of nuclear stress test 08/03/2011   attenuation at apex - no perfusion defects   . HTN (hypertension) 11/06/2013  . Hyperkalemia, on ACE prior to admission 11/11/2011  . Hypertension   . Hyponatremia 01/31/2012  .  Migraine    "qod to q couple months since I was 21" (02/03/2017)  . Mild aortic stenosis 08/01/2011   AVA 1.69 cm2 (06/02/2011)   . Moderate to severe pulmonary hypertension ([place])   . NSTEMI (non-ST elevated myocardial infarction) ([place]) 2006  . NSVT (nonsustained ventricular tachycardia) (H[facility])    h/o  . On home oxygen therapy    "3L just at night; have it available prn duringtheday" (09/11/2018)  . Pneumonia    "alot of times" (02/03/2017)  . SBO (small bowel obstruction) ([place]) 04/23/2020   PARTIAL    Past Surgical History:  Procedure  Laterality Date  . BREAST BIOPSY Right 2008  . BREAST LUMPECTOMY Right 2008   malignant  . CATARACT EXTRACTION W/ INTRAOCULAR LENS  IMPLANT, BILATERAL Bilateral ~ 2013  . [place]; 1971; 1978  . CHOLECYSTECTOMY OPEN  2011  . COLON SURGERY    . COLOSTOMY  11/2007  . COLOSTOMY CLOSURE  07/2008  . CORONARY ANGIOPLASTY WITH STENT PLACEMENT  2006   "2 stents"  . ERCP N/A 09/05/2015   Procedure: ENDOSCOPIC RETROGRADE CHOLANGIOPANCREATOGRAPHY (ERCP);  Surgeon: Ladene Artist, MD;  Location: [facility];  Service: Endoscopy;  Laterality: N/A;  . PARTIAL COLECTOMY  2009   for obstruction: temporary ostomy, later reversed.   Marland Kitchen RIGHT HEART CATHETERIZATION N/A 09/05/2013   Procedure: RIGHT HEART CATH;  Surgeon: Jolaine Artist, MD;  Location[facility] C[facility];  Service: Cardiovascular;  Laterality: N/A;  . TRANSTHORACIC ECHOCARDIOGRAM  11/12/2011   EF 16-10%, normal systolic function, grade 1 diastolic dysfunction; ventricular septal flattening (D-sign); mild AS; trace-mild MR; LA mildly dilated; RV mod dilated; RA mod dilated; severe pulm HTN; elevated CVP     A IV Location/Drains/Wounds Patient Lines/Drains/Airways Status    Active Line/Drains/Airways    Name Placement date Placement time Site Days   Peripheral IV 12/23/20 Left Wrist 12/23/20  0130  Wrist  less than 1   Peripheral IV 12/23/20 Left Forearm 12/23/20  0400  Forearm  less than 1          Intake/Output Last 24 hours  Intake/Output Summary (Last 24 hours) at 12/23/2020 9604 Last data filed at 12/23/2020 0410 Gross per 24 hour  Intake 1149 ml  Output --  Net 1149 ml    Labs/Imaging Results for orders placed or performed during the hospital encounter of 12/22/20 (from the past 48 hour(s))  Urinalysis, Routine w reflex microscopic Urine, Clean Catch     Status: Abnormal   Collection Time: 12/23/20 12:55 AM  Result Value Ref Range   Color, Urine YELLOW YELLOW   APPearance CLEAR CLEAR   Specific Gravity, Urine  1.014 1.005 - 1.030   pH 7.0 5.0 - 8.0   Glucose, UA NEGATIVE NEGATIVE mg/dL   Hgb urine dipstick NEGATIVE NEGATIVE   Bilirubin Urine NEGATIVE NEGATIVE   Ketones, ur NEGATIVE NEGATIVE mg/dL   Protein, ur 100 (A) NEGATIVE mg/dL   Nitrite NEGATIVE NEGATIVE   Leukocytes,Ua NEGATIVE NEGATIVE   RBC / HPF 0-5 0 - 5 RBC/hpf   WBC, UA 0-5 0 - 5 WBC/hpf   Bacteria, UA NONE SEEN NONE SEEN   Squamous Epithelial / LPF 0-5 0 - 5   Mucus PRESENT     Comment: Performed at [facility], [address] [place], [place] [address]  Lactic acid, plasma     Status: None   Collection Time: 12/23/20  1:40 AM  Result Value Ref Range   Lactic Acid, Venous 1.2  0.5 - 1.9 mmol/L    Comment: Performed at [facility], [place] [address] [place], [place] [address]  Comprehensive metabolic panel     Status: Abnormal   Collection Time: 12/23/20  1:41 AM  Result Value Ref Range   Sodium 138 135 - 145 mmol/L   Potassium 4.6 3.5 - 5.1 mmol/L   Chloride 98 98 - 111 mmol/L   CO2 30 22 - 32 mmol/L   Glucose, Bld 85 70 - 99 mg/dL    Comment: Glucose reference range applies only to samples taken after fasting for at least 8 hours.   BUN 17 8 - 23 mg/dL   Creatinine, Ser 1.18 (H) 0.44 - 1.00 mg/dL   Calcium 9.4 8.9 - 10.3 mg/dL   Total Protein 7.4 6.5 - 8.1 g/dL   Albumin 4.1 3.5 - 5.0 g/dL   AST 27 15 - 41 U/L   ALT 14 0 - 44 U/L   Alkaline Phosphatase 46 38 - 126 U/L   Total Bilirubin 0.3 0.3 - 1.2 mg/dL   GFR, Estimated 48 (L) >60 mL/min    Comment: (NOTE) Calculated using the CKD-EPI Creatinine Equation (2021)    Anion gap 10 5 - 15    Comment: Performed at [facility], [place] [address] [place], [place] [address]  CBC WITH DIFFERENTIAL     Status: Abnormal   Collection Time: 12/23/20  1:41 AM  Result Value Ref Range   WBC 13.9 (H) 4.0 - 10.5 K/uL   RBC 4.15 3.87 - 5.11 MIL/uL   Hemoglobin 13.2 12.0 - 15.0 g/dL   HCT 41.4 36.0 - 46.0 %   MCV 99.8  80.0 - 100.0 fL   MCH 31.8 26.0 - 34.0 pg   MCHC 31.9 30.0 - 36.0 g/dL   RDW 13.3 11.5 - 15.5 %   Platelets 200 150 - 400 K/uL   nRBC 0.0 0.0 - 0.2 %   Neutrophils Relative % 92 %   Neutro Abs 12.6 (H) 1.7 - 7.7 K/uL   Lymphocytes Relative 2 %   Lymphs Abs 0.3 (L) 0.7 - 4.0 K/uL   Monocytes Relative 6 %   Monocytes Absolute 0.9 0.1 - 1.0 K/uL   Eosinophils Relative 0 %   Eosinophils Absolute 0.0 0.0 - 0.5 K/uL   Basophils Relative 0 %   Basophils Absolute 0.0 0.0 - 0.1 K/uL   Immature Granulocytes 0 %   Abs Immature Granulocytes 0.05 0.00 - 0.07 K/uL    Comment: Performed at [facility], [place] [address], [place], [place] [address]  Protime-INR     Status: None   Collection Time: 12/23/20  1:41 AM  Result Value Ref Range   Prothrombin Time 12.3 11.4 - 15.2 seconds   INR 1.0 0.8 - 1.2    Comment: (NOTE) INR goal varies based on device and disease states. Performed at [facility], [place] [address], [place], [place] [address]   APTT     Status: None   Collection Time: 12/23/20  1:41 AM  Result Value Ref Range   aPTT 26 24 - 36 seconds    Comment: Performed at [facility], [place] [address], [place], [place] [address]  Troponin I (High Sensitivity)     Status: Abnormal   Collection Time: 12/23/20  1:42 AM  Result Value Ref Range   Troponin I (High Sensitivity) 31 (H) <18 ng/L    Comment: (NOTE) Elevated high sensitivity troponin I (hsTnI) values  and significant  changes across serial measurements may suggest ACS but many other  chronic and acute conditions are known to elevate hsTnI results.  Refer to the "Links" section for chest pain algorithms and additional  guidance. Performed at [facility], [place] [address], [place], [place] [address]   Resp Panel by RT-PCR (Flu A&B, Covid) Nasopharyngeal Swab     Status: None   Collection Time: 12/23/20  1:48 AM   Specimen: Nasopharyngeal Swab;  Nasopharyngeal(NP) swabs in vial transport medium  Result Value Ref Range   SARS Coronavirus 2 by RT PCR NEGATIVE NEGATIVE    Comment: (NOTE) SARS-CoV-2 target nucleic acids are NOT DETECTED.  The SARS-CoV-2 RNA is generally detectable in upper respiratory specimens during the acute phase of infection. The lowest concentration of SARS-CoV-2 viral copies this assay can detect is 138 copies/mL. A negative result does not preclude SARS-Cov-2 infection and should not be used as the sole basis for treatment or other patient management decisions. A negative result may occur with  improper specimen collection/handling, submission of specimen other than nasopharyngeal swab, presence of viral mutation(s) within the areas targeted by this assay, and inadequate number of viral copies(<138 copies/mL). A negative result must be combined with clinical observations, patient history, and epidemiological information. The expected result is Negative.  Fact Sheet for Patients:  EntrepreneurPulse.com.au  Fact Sheet for Healthcare Providers:  IncredibleEmployment.be  This test is no t yet approved or cleared by the [place] FDA and  has been authorized for detection and/or diagnosis of SARS-CoV-2 by FDA under an Emergency Use Authorization (EUA). This EUA will remain  in effect (meaning this test can be used) for the duration of the COVID-19 declaration under Section 564(b)(1) of the Act, 21 U.S.C.section 360bbb-3(b)(1), unless the authorization is terminated  or revoked sooner.       Influenza A by PCR NEGATIVE NEGATIVE   Influenza B by PCR NEGATIVE NEGATIVE    Comment: (NOTE) The Xpert Xpress SARS-CoV-2/FLU/RSV plus assay is intended as an aid in the diagnosis of influenza from Nasopharyngeal swab specimens and should not be used as a sole basis for treatment. Nasal washings and aspirates are unacceptable for Xpert Xpress  SARS-CoV-2/FLU/RSV testing.  Fact Sheet for Patients: EntrepreneurPulse.com.au  Fact Sheet for Healthcare Providers: IncredibleEmployment.be  This test is not yet approved or cleared by the [place] FDA and has been authorized for detection and/or diagnosis of SARS-CoV-2 by FDA under an Emergency Use Authorization (EUA). This EUA will remain in effect (meaning this test can be used) for the duration of the COVID-19 declaration under Section 564(b)(1) of the Act, 21 U.S.C. section 360bbb-3(b)(1), unless the authorization is terminated or revoked.  Performed at [facility], [place] [address] [place], [place] [address]   Lactic acid, plasma     Status: None   Collection Time: 12/23/20  4:08 AM  Result Value Ref Range   Lactic Acid, Venous 1.0 0.5 - 1.9 mmol/L    Comment: Performed at [facility], [place] [address], [place], [place] [address]  Troponin I (High Sensitivity)     Status: Abnormal   Collection Time: 12/23/20  4:08 AM  Result Value Ref Range   Troponin I (High Sensitivity) 92 (H) <18 ng/L    Comment: (NOTE) Elevated high sensitivity troponin I (hsTnI) values and significant  changes across serial measurements may suggest ACS but many other  chronic and acute conditions are known to elevate hsTnI results.  Refer to the Links  section for chest pain algorithms and additional  guidance. Performed at [facility], [place] [address] [place], [place] [address]   Basic metabolic panel     Status: Abnormal   Collection Time: 12/23/20  4:08 AM  Result Value Ref Range   Sodium 141 135 - 145 mmol/L   Potassium 4.0 3.5 - 5.1 mmol/L   Chloride 100 98 - 111 mmol/L   CO2 33 (H) 22 - 32 mmol/L   Glucose, Bld 91 70 - 99 mg/dL    Comment: Glucose reference range applies only to samples taken after fasting for at least 8 hours.   BUN 17 8 - 23 mg/dL   Creatinine, Ser 1.08 (H) 0.44 - 1.00  mg/dL   Calcium 9.2 8.9 - 10.3 mg/dL   GFR, Estimated 54 (L) >60 mL/min    Comment: (NOTE) Calculated using the CKD-EPI Creatinine Equation (2021)    Anion gap 8 5 - 15    Comment: Performed at [facility], [place] [address], [place], [place] [address]   DG Chest Port 1 View  Result Date: 12/23/2020 CLINICAL DATA:  Shortness of breath and weakness EXAM: PORTABLE CHEST 1 VIEW COMPARISON:  04/22/2020 FINDINGS: Cardiac shadow is stable. Aortic calcifications are noted. Left lung is well aerated and clear. Right lung demonstrates patchy basilar infiltrate new from the prior exam. Postsurgical changes in the right axilla are noted. IMPRESSION: Right basilar infiltrate. Electronically Signed   By: Inez Catalina M.D.   On: 12/23/2020 01:23    Pending Labs Unresulted Labs (From admission, onward)          Start     Ordered   12/30/20 0500  Creatinine, serum  (enoxaparin (LOVENOX)    CrCl >/= 30 ml/min)  Weekly,   R     Comments: while on enoxaparin therapy    12/23/20 0435   12/23/20 0055  Blood Culture (routine x 2)  (Septic presentation on arrival (screening labs, nursing and treatment orders for obvious sepsis))  BLOOD CULTURE X 2,   STAT      12/23/20 0056   12/23/20 0055  Urine culture  (Septic presentation on arrival (screening labs, nursing and treatment orders for obvious sepsis))  ONCE - STAT,   STAT        12/23/20 0056          Vitals/Pain Today's Vitals   12/23/20 0300 12/23/20 0409 12/23/20 0430 12/23/20 0510  BP: (!) 118/103 124/64 (!) 141/61 (!) 153/111  Pulse: (!) 101 97 (!) 101 91  Resp: (!) 22 (!) 21 18 (!) 22  Temp:    98.6 F (37 C)  TempSrc:    Oral  SpO2: 98% 98% 97% 94%  Weight:      Height:      PainSc:        Isolation Precautions No active isolations  Medications Medications  enoxaparin (LOVENOX) injection 40 mg (has no administration in time range)  lactated ringers infusion ( Intravenous New Bag/Given 12/23/20 0510)   butalbital-acetaminophen-caffeine (FIORICET) 50-325-40 MG per tablet 1 tablet (has no administration in time range)  ALPRAZolam (XANAX) tablet 1 mg (has no administration in time range)  nortriptyline (PAMELOR) capsule 50 mg (has no administration in time range)  linaclotide (LINZESS) capsule 72 mcg (has no administration in time range)  mometasone-formoterol (DULERA) 200-5 MCG/ACT inhaler 2 puff (has no administration in time range)  montelukast (SINGULAIR) tablet 10 mg (has no administration in time range)  pravastatin (PRAVACHOL) tablet 40  mg (has no administration in time range)  ipratropium-albuterol (DUONEB) 0.5-2.5 (3) MG/3ML nebulizer solution 3 mL (has no administration in time range)  levofloxacin (LEVAQUIN) IVPB 750 mg (has no administration in time range)  levofloxacin (LEVAQUIN) IVPB 750 mg (0 mg Intravenous Stopped 12/23/20 0309)  lactated ringers bolus 1,000 mL (0 mLs Intravenous Stopped 12/23/20 0410)  ipratropium-albuterol (DUONEB) 0.5-2.5 (3) MG/3ML nebulizer solution 3 mL (3 mLs Nebulization Given 12/23/20 0437)    Mobility walks with device Moderate fall risk   Focused Assessments    R Recommendations: See Admitting Provider Note  Report given to:   Additional Notes:

## 2020-12-24 ENCOUNTER — Inpatient Hospital Stay (HOSPITAL_COMMUNITY): Payer: Medicare Other

## 2020-12-24 DIAGNOSIS — R7989 Other specified abnormal findings of blood chemistry: Secondary | ICD-10-CM | POA: Diagnosis not present

## 2020-12-24 DIAGNOSIS — R0602 Shortness of breath: Secondary | ICD-10-CM | POA: Diagnosis not present

## 2020-12-24 DIAGNOSIS — R509 Fever, unspecified: Secondary | ICD-10-CM | POA: Diagnosis not present

## 2020-12-24 DIAGNOSIS — D72825 Bandemia: Secondary | ICD-10-CM

## 2020-12-24 DIAGNOSIS — A419 Sepsis, unspecified organism: Principal | ICD-10-CM

## 2020-12-24 DIAGNOSIS — R5381 Other malaise: Secondary | ICD-10-CM

## 2020-12-24 DIAGNOSIS — I5033 Acute on chronic diastolic (congestive) heart failure: Secondary | ICD-10-CM

## 2020-12-24 DIAGNOSIS — R6520 Severe sepsis without septic shock: Secondary | ICD-10-CM

## 2020-12-24 LAB — BASIC METABOLIC PANEL
Anion gap: 6 (ref 5–15)
BUN: 16 mg/dL (ref 8–23)
CO2: 32 mmol/L (ref 22–32)
Calcium: 9.2 mg/dL (ref 8.9–10.3)
Chloride: 101 mmol/L (ref 98–111)
Creatinine, Ser: 0.84 mg/dL (ref 0.44–1.00)
GFR, Estimated: >60 mL/min (ref >60)
Glucose, Bld: 130 mg/dL — ABNORMAL HIGH (ref 70–99)
Potassium: 4.2 mmol/L (ref 3.5–5.1)
Sodium: 139 mmol/L (ref 135–145)

## 2020-12-24 LAB — ECHOCARDIOGRAM COMPLETE
AR max vel: 1.25 cm2
AV Area VTI: 1.29 cm2
AV Area mean vel: 1.13 cm2
AV Mean grad: 26.8 mmHg
AV Peak grad: 43.7 mmHg
Ao pk vel: 3.31 m/s
Area-P 1/2: 3.23 cm2
Height: 62 in
S' Lateral: 3.3 cm
Weight: 1760 oz

## 2020-12-24 LAB — URINE CULTURE

## 2020-12-24 LAB — CBC
HCT: 35.4 % — ABNORMAL LOW (ref 36.0–46.0)
Hemoglobin: 11.2 g/dL — ABNORMAL LOW (ref 12.0–15.0)
MCH: 32 pg (ref 26.0–34.0)
MCHC: 31.6 g/dL (ref 30.0–36.0)
MCV: 101.1 fL — ABNORMAL HIGH (ref 80.0–100.0)
Platelets: 149 10*3/uL — ABNORMAL LOW (ref 150–400)
RBC: 3.5 MIL/uL — ABNORMAL LOW (ref 3.87–5.11)
RDW: 13.3 % (ref 11.5–15.5)
WBC: 13.3 10*3/uL — ABNORMAL HIGH (ref 4.0–10.5)
nRBC: 0 % (ref 0.0–0.2)

## 2020-12-24 LAB — MAGNESIUM: Magnesium: 2.2 mg/dL (ref 1.7–2.4)

## 2020-12-24 LAB — BRAIN NATRIURETIC PEPTIDE: B Natriuretic Peptide: 238.1 pg/mL — ABNORMAL HIGH (ref 0.0–100.0)

## 2020-12-24 MED ORDER — IPRATROPIUM-ALBUTEROL 0.5-2.5 (3) MG/3ML IN SOLN
3.0000 mL | Freq: Four times a day (QID) | RESPIRATORY_TRACT | Status: DC
  Administered 2020-12-24: 3 mL via RESPIRATORY_TRACT
  Filled 2020-12-24: qty 3

## 2020-12-24 MED ORDER — FUROSEMIDE 10 MG/ML IJ SOLN
20.0000 mg | Freq: Once | INTRAMUSCULAR | Status: AC
  Administered 2020-12-24: 20 mg via INTRAVENOUS
  Filled 2020-12-24: qty 2

## 2020-12-24 MED ORDER — IPRATROPIUM-ALBUTEROL 0.5-2.5 (3) MG/3ML IN SOLN
3.0000 mL | Freq: Three times a day (TID) | RESPIRATORY_TRACT | Status: DC
  Administered 2020-12-25 (×2): 3 mL via RESPIRATORY_TRACT
  Filled 2020-12-24 (×2): qty 3

## 2020-12-24 NOTE — Progress Notes (Signed)
Echocardiogram 2D Echocardiogram with 3D and strain has been performed.  Darlina Sicilian M 12/24/2020, 2:46 PM

## 2020-12-24 NOTE — Progress Notes (Signed)
PROGRESS NOTE  [name] QTM:[id number] DOB: 1946-09-13   PCP: [name], MD  Patient is from: Home.  Lives with daughter.  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  Blood cultures negative.  On IV Levaquin.  Has history of anaphylaxis with ceftriaxone.  Subjective: And examined earlier this morning.  No major events overnight of this morning.  Reports productive cough with whitish phlegm but no hemoptysis.  Reports some improvement in her breathing but not quite close to baseline.  She also reports not having bowel movement in the last 48 hours.  Denies chest pain, nausea, vomiting or abdominal pain.  She denies UTI symptoms.  Objective: Vitals:   12/24/20 0653 12/24/20 0839 12/24/20 1304 12/24/20 1342  BP: (!) 105/56   (!) 140/58  Pulse: 77   80  Resp: (!) 21     Temp: 98.5 F (36.9 C)   97.8 F (36.6 C)  TempSrc: Oral   Oral  SpO2: 97% 97% 95% 98%  Weight:      Height:        Intake/Output Summary (Last 24 hours) at 12/24/2020 1419 Last data filed at 12/24/2020 0600 Gross per 24 hour  Intake 480 ml  Output --  Net 480 ml   Filed Weights   12/23/20 0022  Weight: 49.9 kg    Examination:  GENERAL: No apparent distress.  Nontoxic. HEENT: MMM.  Vision and hearing grossly intact.  NECK: Supple.  No apparent JVD.  RESP: 97% on 4 L.  No IWOB.  Fair aeration bilaterally. CVS:  RRR.  3/6 SEM over RUSB, less louder elsewhere. ABD/GI/GU: BS+. Abd soft, NTND.  MSK/EXT:  Moves extremities. No apparent deformity. No edema.  SKIN: no apparent skin lesion or wound NEURO: Awake, alert and oriented appropriately.  No apparent focal neuro deficit. PSYCH: Calm. Normal  affect.  Procedures:  None  Microbiology summarized: [id number] and influenza PCR nonreactive. Blood cultures NGTD. Urine culture with multiple species.  Assessment & Plan: Severe sepsis due to community-acquired pneumonia: POA.  Had fever, leukocytosis and elevated troponin with known source of infection.  Blood cultures NGTD.  Symptoms improving but not quite close to baseline and continues to require higher than baseline oxygen. -Continue IV Levaquin given history of anaphylaxis with ceftriaxone.  Clarify dosing with pharmacy. -Mucolytic's, incentive spirometry, OOB/PT/OT  Chronic diastolic CHF: TTE in 11/7340 with LVEF of 55 to 60%, G1-DD, mild to moderate AS.  Appears euvolemic on exam but slightly elevated BNP and troponin.  Has significant RUSB murmur on exam. -Follow TTE. -IV Lasix 20 mg x 1 for trial -Monitor fluid status, intake and output and renal function   COPD with acute exacerbation with mild acute on chronic hypoxic respiratory failure.  Baseline. -Continue prednisone, nebulizers and incentive spirometry -Wean oxygen as able  History of essential hypertension -Lasix as above  CKD-3A?  Creatinine within normal Recent Labs    04/22/20 1150 04/23/20 0500 04/24/20 0131 04/25/20 0643 10/07/20 1345 10/30/20 1412 12/23/20 0141 12/23/20 0408 12/24/20 0355  BUN 15 12 16 19  24* 15 17 17 16   CREATININE 1.21* 1.09* 1.10* 1.02* 1.34* 1.00 1.18* 1.08* 0.84  -Continue monitoring   Mildly elevated troponin, history of CAD.  Likely demand ischemia from the above.  Patient has no chest pain. -Continue low-dose aspirin and statin. -Follow TTE  Ongoing nicotine use -Encouraged to quit smoking cigarettes. -Continue nicotine patch  Chronic constipation -Home Linzess  Anxiety, migraine -Continue home medications  Leukocytosis: Likely demargination from steroid.  Debility/physical deconditioning  -PT/OT eval  Body mass index is 20.12 kg/m.         DVT  prophylaxis:  enoxaparin (LOVENOX) injection 40 mg Start: 12/23/20 1000  Code Status: Full code Family Communication: Patient and/or RN. Available if any question.  Level of care: Telemetry Status is: Inpatient  Remains inpatient appropriate because:Ongoing diagnostic testing needed not appropriate for outpatient work up, IV treatments appropriate due to intensity of illness or inability to take PO and Inpatient level of care appropriate due to severity of illness   Dispo: The patient is from: Home              Anticipated d/c is to: Home              Patient currently is not medically stable to d/c.   Difficult to place patient No       Consultants:  None   Sch Meds:  Scheduled Meds: . vitamin C  500 mg Oral Daily  . aspirin EC  81 mg Oral Daily  . cholecalciferol  1,000 Units Oral Daily  . enoxaparin (LOVENOX) injection  40 mg Subcutaneous Q24H  . ipratropium-albuterol  3 mL Nebulization Q6H  . linaclotide  72 mcg Oral QAC breakfast  . loratadine  10 mg Oral QPM  . mometasone-formoterol  2 puff Inhalation BID  . montelukast  10 mg Oral QHS  . nortriptyline  50 mg Oral QHS  . pantoprazole  40 mg Oral Daily  . potassium chloride SA  20 mEq Oral Daily  . pravastatin  40 mg Oral q1800  . predniSONE  30 mg Oral Q breakfast   Continuous Infusions: . [START ON 12/25/2020] levofloxacin (LEVAQUIN) IV     PRN Meds:.ALPRAZolam, butalbital-acetaminophen-caffeine, diclofenac sodium, ipratropium-albuterol, lactulose (encephalopathy), ondansetron  Antimicrobials: Anti-infectives (From admission, onward)   Start     Dose/Rate Route Frequency Ordered Stop   12/25/20 0200  levofloxacin (LEVAQUIN) IVPB 750 mg        750 mg 100 mL/hr over 90 Minutes Intravenous Every 48 hours 12/23/20 0443 12/31/20 0159   12/23/20 0445  levofloxacin (LEVAQUIN) IVPB 750 mg  Status:  Discontinued        750 mg 100 mL/hr over 90 Minutes Intravenous Every 24 hours 12/23/20 0435 12/23/20 0443    12/23/20 0100  levofloxacin (LEVAQUIN) IVPB 750 mg        750 mg 100 mL/hr over 90 Minutes Intravenous  Once 12/23/20 0056 12/23/20 0309       I have personally reviewed the following labs and images: CBC: Recent Labs  Lab 12/23/20 0141 12/24/20 0355  WBC 13.9* 13.3*  NEUTROABS 12.6*  --   HGB 13.2 11.2*  HCT 41.4 35.4*  MCV 99.8 101.1*  PLT 200 149*   BMP &GFR Recent Labs  Lab 12/23/20 0141 12/23/20 0408 12/24/20 0355  NA 138 141 139  K 4.6 4.0 4.2  CL 98 100 101  CO2 30 33* 32  GLUCOSE 85 91 130*  BUN 17 17 16   CREATININE 1.18* 1.08* 0.84  CALCIUM 9.4 9.2 9.2  MG  --   --  2.2   Estimated Creatinine Clearance: 45.6 mL/min (by C-G formula based on SCr of 0.84  mg/dL). Liver & Pancreas: Recent Labs  Lab 12/23/20 0141  AST 27  ALT 14  ALKPHOS 46  BILITOT 0.3  PROT 7.4  ALBUMIN 4.1   No results for input(s): LIPASE, AMYLASE in the last 168 hours. No results for input(s): AMMONIA in the last 168 hours. Diabetic: No results for input(s): HGBA1C in the last 72 hours. No results for input(s): GLUCAP in the last 168 hours. Cardiac Enzymes: No results for input(s): CKTOTAL, CKMB, CKMBINDEX, TROPONINI in the last 168 hours. No results for input(s): PROBNP in the last 8760 hours. Coagulation Profile: Recent Labs  Lab 12/23/20 0141  INR 1.0   Thyroid Function Tests: No results for input(s): TSH, T4TOTAL, FREET4, T3FREE, THYROIDAB in the last 72 hours. Lipid Profile: No results for input(s): CHOL, HDL, LDLCALC, TRIG, CHOLHDL, LDLDIRECT in the last 72 hours. Anemia Panel: No results for input(s): VITAMINB12, FOLATE, FERRITIN, TIBC, IRON, RETICCTPCT in the last 72 hours. Urine analysis:    Component Value Date/Time   COLORURINE YELLOW 12/23/2020 0055   APPEARANCEUR CLEAR 12/23/2020 0055   LABSPEC 1.014 12/23/2020 0055   PHURINE 7.0 12/23/2020 0055   GLUCOSEU NEGATIVE 12/23/2020 0055   GLUCOSEU NEGATIVE 12/19/2013 1127   HGBUR NEGATIVE 12/23/2020 0055    BILIRUBINUR NEGATIVE 12/23/2020 0055   KETONESUR NEGATIVE 12/23/2020 0055   PROTEINUR 100 (A) 12/23/2020 0055   UROBILINOGEN 0.2 12/19/2013 1127   NITRITE NEGATIVE 12/23/2020 0055   LEUKOCYTESUR NEGATIVE 12/23/2020 0055   Sepsis Labs: Invalid input(s): PROCALCITONIN, [place]  Microbiology: Recent Results (from the past 240 hour(s))  Blood Culture (routine x 2)     Status: None (Preliminary result)   Collection Time: 12/23/20  1:42 AM   Specimen: BLOOD  Result Value Ref Range Status   Specimen Description   Final    BLOOD LEFT WRIST Performed at [facility] [address], [place], [place] [address]    Special Requests   Final    BOTTLES DRAWN AEROBIC AND ANAEROBIC Blood Culture adequate volume Performed at [facility] [address], [place], [place] [address]    Culture   Final    NO GROWTH 1 DAY Performed at [facility], [place] [address], [place], [place] [address]    Report Status PENDING  Incomplete  Resp Panel by RT-PCR (Flu A&B, Covid) Nasopharyngeal Swab     Status: None   Collection Time: 12/23/20  1:48 AM   Specimen: Nasopharyngeal Swab; Nasopharyngeal(NP) swabs in vial transport medium  Result Value Ref Range Status   SARS Coronavirus 2 by RT PCR NEGATIVE NEGATIVE Final    Comment: (NOTE) SARS-CoV-2 target nucleic acids are NOT DETECTED.  The SARS-CoV-2 RNA is generally detectable in upper respiratory specimens during the acute phase of infection. The lowest concentration of SARS-CoV-2 viral copies this assay can detect is 138 copies/mL. A negative result does not preclude SARS-Cov-2 infection and should not be used as the sole basis for treatment or other patient management decisions. A negative result may occur with  improper specimen collection/handling, submission of specimen other than nasopharyngeal swab, presence of viral mutation(s) within the areas targeted by this assay, and inadequate number of  viral copies(<138 copies/mL). A negative result must be combined with clinical observations, patient history, and epidemiological information. The expected result is Negative.  Fact Sheet for Patients:  EntrepreneurPulse.com.au  Fact Sheet for Healthcare Providers:  IncredibleEmployment.be  This test is no t yet approved or cleared by the [place] FDA and  has been  authorized for detection and/or diagnosis of SARS-CoV-2 by FDA under an Emergency Use Authorization (EUA). This EUA will remain  in effect (meaning this test can be used) for the duration of the COVID-19 declaration under Section 564(b)(1) of the Act, 21 U.S.C.section 360bbb-3(b)(1), unless the authorization is terminated  or revoked sooner.       Influenza A by PCR NEGATIVE NEGATIVE Final   Influenza B by PCR NEGATIVE NEGATIVE Final    Comment: (NOTE) The Xpert Xpress SARS-CoV-2/FLU/RSV plus assay is intended as an aid in the diagnosis of influenza from Nasopharyngeal swab specimens and should not be used as a sole basis for treatment. Nasal washings and aspirates are unacceptable for Xpert Xpress SARS-CoV-2/FLU/RSV testing.  Fact Sheet for Patients: EntrepreneurPulse.com.au  Fact Sheet for Healthcare Providers: IncredibleEmployment.be  This test is not yet approved or cleared by the [place] FDA and has been authorized for detection and/or diagnosis of SARS-CoV-2 by FDA under an Emergency Use Authorization (EUA). This EUA will remain in effect (meaning this test can be used) for the duration of the COVID-19 declaration under Section 564(b)(1) of the Act, 21 U.S.C. section 360bbb-3(b)(1), unless the authorization is terminated or revoked.  Performed at [facility], [place] [address], [place], [place] [address]   Urine culture     Status: Abnormal   Collection Time: 12/23/20  3:43 AM   Specimen: In/Out Cath  Urine  Result Value Ref Range Status   Specimen Description   Final    IN/OUT CATH URINE Performed at [facility] [address] [place], [place] [address]    Special Requests   Final    NONE Performed at [facility], [place] [address] [place], [place] [address]    Culture MULTIPLE SPECIES PRESENT, SUGGEST RECOLLECTION (A)  Final   Report Status 12/24/2020 FINAL  Final  Blood Culture (routine x 2)     Status: None (Preliminary result)   Collection Time: 12/23/20  4:08 AM   Specimen: BLOOD  Result Value Ref Range Status   Specimen Description   Final    BLOOD BLOOD LEFT FOREARM Performed at [facility] [address] [place], [place] [address]    Special Requests   Final    BOTTLES DRAWN AEROBIC AND ANAEROBIC BCAV Performed at [facility] [place] [address] [place], [place] [address]    Culture   Final    NO GROWTH 1 DAY Performed at [facility], [place] [address], [place], [place] [address]    Report Status PENDING  Incomplete    Radiology Studies: No results found.     Nelsy Madonna T. Libby  If 7PM-7AM, please contact night-coverage [email or web address] 12/24/2020, 2:19 PM

## 2020-12-25 ENCOUNTER — Ambulatory Visit: Payer: Medicare Other | Admitting: Internal Medicine

## 2020-12-25 DIAGNOSIS — D519 Vitamin B12 deficiency anemia, unspecified: Secondary | ICD-10-CM

## 2020-12-25 DIAGNOSIS — N179 Acute kidney failure, unspecified: Secondary | ICD-10-CM

## 2020-12-25 DIAGNOSIS — I350 Nonrheumatic aortic (valve) stenosis: Secondary | ICD-10-CM

## 2020-12-25 DIAGNOSIS — D509 Iron deficiency anemia, unspecified: Secondary | ICD-10-CM

## 2020-12-25 DIAGNOSIS — I5032 Chronic diastolic (congestive) heart failure: Secondary | ICD-10-CM

## 2020-12-25 LAB — CBC
HCT: 34 % — ABNORMAL LOW (ref 36.0–46.0)
Hemoglobin: 10.6 g/dL — ABNORMAL LOW (ref 12.0–15.0)
MCH: 31.6 pg (ref 26.0–34.0)
MCHC: 31.2 g/dL (ref 30.0–36.0)
MCV: 101.5 fL — ABNORMAL HIGH (ref 80.0–100.0)
Platelets: 173 10*3/uL (ref 150–400)
RBC: 3.35 MIL/uL — ABNORMAL LOW (ref 3.87–5.11)
RDW: 13.2 % (ref 11.5–15.5)
WBC: 9.7 10*3/uL (ref 4.0–10.5)
nRBC: 0 % (ref 0.0–0.2)

## 2020-12-25 LAB — IRON AND TIBC
Iron: 15 ug/dL — ABNORMAL LOW (ref 28–170)
Saturation Ratios: 5 % — ABNORMAL LOW (ref 10.4–31.8)
TIBC: 285 ug/dL (ref 250–450)
UIBC: 270 ug/dL

## 2020-12-25 LAB — VITAMIN B12: Vitamin B-12: 140 pg/mL — ABNORMAL LOW (ref 180–914)

## 2020-12-25 LAB — RETICULOCYTES
Immature Retic Fract: 12.2 % (ref 2.3–15.9)
RBC.: 3.35 MIL/uL — ABNORMAL LOW (ref 3.87–5.11)
Retic Count, Absolute: 48.6 10*3/uL (ref 19.0–186.0)
Retic Ct Pct: 1.5 % (ref 0.4–3.1)

## 2020-12-25 LAB — FOLATE: Folate: 9.2 ng/mL (ref >5.9)

## 2020-12-25 LAB — RENAL FUNCTION PANEL
Albumin: 3.2 g/dL — ABNORMAL LOW (ref 3.5–5.0)
Anion gap: 7 (ref 5–15)
BUN: 12 mg/dL (ref 8–23)
CO2: 32 mmol/L (ref 22–32)
Calcium: 9.2 mg/dL (ref 8.9–10.3)
Chloride: 99 mmol/L (ref 98–111)
Creatinine, Ser: 0.74 mg/dL (ref 0.44–1.00)
GFR, Estimated: >60 mL/min (ref >60)
Glucose, Bld: 134 mg/dL — ABNORMAL HIGH (ref 70–99)
Phosphorus: 1.9 mg/dL — ABNORMAL LOW (ref 2.5–4.6)
Potassium: 3.7 mmol/L (ref 3.5–5.1)
Sodium: 138 mmol/L (ref 135–145)

## 2020-12-25 LAB — MAGNESIUM: Magnesium: 1.8 mg/dL (ref 1.7–2.4)

## 2020-12-25 LAB — FERRITIN: Ferritin: 74 ng/mL (ref 11–307)

## 2020-12-25 MED ORDER — LEVOFLOXACIN 750 MG PO TABS: 750.0000 mg | ORAL_TABLET | ORAL | 0 refills | Status: AC

## 2020-12-25 MED ORDER — SODIUM CHLORIDE 0.9 % IV SOLN
510.0000 mg | Freq: Once | INTRAVENOUS | Status: AC
  Administered 2020-12-25: 510 mg via INTRAVENOUS
  Filled 2020-12-25: qty 510

## 2020-12-25 MED ORDER — FERROUS SULFATE 325 (65 FE) MG PO TBEC: 325.0000 mg | DELAYED_RELEASE_TABLET | Freq: Two times a day (BID) | ORAL | 1 refills | Status: DC

## 2020-12-25 MED ORDER — VITAMIN B-12 1000 MCG PO TABS: 1000.0000 ug | ORAL_TABLET | Freq: Every day | ORAL | 1 refills | Status: DC

## 2020-12-25 MED ORDER — PREDNISONE 20 MG PO TABS: 40.0000 mg | ORAL_TABLET | Freq: Every day | ORAL | 0 refills | Status: AC

## 2020-12-25 MED ORDER — POTASSIUM PHOSPHATES 15 MMOLE/5ML IV SOLN
30.0000 mmol | Freq: Once | INTRAVENOUS | Status: AC
  Administered 2020-12-25: 30 mmol via INTRAVENOUS
  Filled 2020-12-25: qty 10

## 2020-12-25 MED ORDER — SPIRIVA HANDIHALER 18 MCG IN CAPS: 18.0000 ug | ORAL_CAPSULE | Freq: Every day | RESPIRATORY_TRACT | 3 refills | Status: DC

## 2020-12-25 MED ORDER — CYANOCOBALAMIN 1000 MCG/ML IJ SOLN
1000.0000 ug | Freq: Every day | INTRAMUSCULAR | Status: DC
  Administered 2020-12-25: 1000 ug via INTRAMUSCULAR
  Filled 2020-12-25: qty 1

## 2020-12-25 MED ORDER — IPRATROPIUM-ALBUTEROL 0.5-2.5 (3) MG/3ML IN SOLN: 3.0000 mL | Freq: Four times a day (QID) | RESPIRATORY_TRACT | 1 refills | Status: DC | PRN

## 2020-12-25 NOTE — Discharge Summary (Signed)
Physician Discharge Summary  [name] OEU:[id number] DOB: 1946-07-15 DOA: 12/22/2020  PCP: [name], MD  Admit date: 12/22/2020 Discharge date: 12/25/2020  Admitted From: Home Disposition: Home  Recommendations for Outpatient Follow-up:  1. Follow ups as below. 2. Please obtain CBC/BMP/Mag at follow up 3. Please follow up on the following pending results: None  Home Health: None required Equipment/Devices: Patient owns home oxygen.  Discharge Condition: Stable CODE STATUS: Full code   Follow-up Information    [name], MD. Schedule an appointment as soon as possible for a visit in 1 week(s).   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   Started on IV Levaquin due to history of anaphylaxis with ceftriaxone. Blood cultures negative.  Also started on oral prednisone and nebulizers for COPD exacerbation.  TTE with LVEF of 55 to 60%, G2-DD and moderate aortic valve stenosis.  On the day of discharge, she felt well and back to her baseline.  Evaluated by therapy and no need was identified.  She was discharged on p.o. Levaquin (#2) every 48 hours for 4 more days.  She is already on LABA/ICS.  We added Spiriva to her regimen.  She is on as needed DuoNeb as well.  Discharge Diagnoses:  Severe sepsis due to community-acquired pneumonia: POA.  Had fever, leukocytosis and elevated troponin with known source of infection.  Blood cultures NGTD.  Improved. -Discharged on p.o. Levaquin (#2) for 4 more days.  Renally dosed.  Allergic to ceftriaxone.  Chronic diastolic CHF: TTE with LVEF of 55 to 60%, G2-DD, moderate AS.  Appears euvolemic on exam but slightly  elevated BNP and troponin.   Appears euvolemic.  Not on diuretics at home. -May consider Lasix as needed outpatient -Outpatient follow-up with a cardiologist  COPD with acute exacerbationwith mild acute on chronic hypoxic respiratory failure.  -No distress noted during ambulation by therapy.  -Continue prednisone, inhalers and nebulizers.  Added Spiriva to her regimen. -Continue home 3 L oxygen by [place].  History of essential hypertension: SBP ranges from 120s to 150s -Reassess and consider as needed diuretics as above.  CKD-3A?    Creatinine back down to normal.  Recent Labs    04/22/20 1150 04/23/20 0500 04/24/20 0131 04/25/20 0643 10/07/20 1345 10/30/20 1412 12/23/20 0141 12/23/20 0408 12/24/20 0355 12/25/20 0304  BUN 15 12 16 19  24* 15 17 17 16 12   CREATININE 1.21* 1.09* 1.10* 1.02* 1.34* 1.00 1.18* 1.08* 0.84 0.74  -Recheck renal function at follow-up  Mildly elevated troponin, history of CAD.  Likely demand ischemia from the above.  Patient has no chest pain.  TTE as above. -Continue low-dose aspirin and statin.  Iron deficiency/vitamin B12 deficiency anemia: H&H stable.  Iron sat 5%.  Vitamin B12 140.  Could be due to PPI use. Recent Labs    04/22/20 1150 04/23/20 0500 04/24/20 0131 04/25/20 0643 05/06/20 1641 10/07/20 1345 12/23/20 0141 12/24/20 0355 12/25/20 0304  HGB 13.3 12.7 14.0 11.2* 12.9 13.6 13.2 11.2* 10.6*  -Received IV Feraheme x1 and vitamin B12 injection x1 -Discharged on p.o. iron and p.o. vitamin B12. -She could benefit from monthly vitamin B12 injection -  Review about ongoing PPI use   Ongoing nicotine use -Encouraged to quit smoking cigarettes.  Chronic constipation -Home Linzess  Anxiety, migraine -Continue home medications  Leukocytosis: Likely demargination from steroid.  Debility/physical deconditioning  -Evaluated by therapy and no needles identified.    Body mass index is 20.97 kg/m.            Discharge  Exam: Vitals:   12/25/20 0811 12/25/20 1331  BP:  (!) 153/66  Pulse:  79  Resp:  16  Temp:  98.6 F (37 C)  SpO2: 100% 95%    GENERAL: No apparent distress.  Nontoxic. HEENT: MMM.  Vision and hearing grossly intact.  NECK: Supple.  No apparent JVD.  RESP: 88 to 95% on 3 L.  No IWOB.  Fair aeration bilaterally. CVS:  RRR. Heart sounds normal.  ABD/GI/GU: Bowel sounds present. Soft. Non tender.  MSK/EXT:  Moves extremities. No apparent deformity.  RUE edema (chronic). SKIN: no apparent skin lesion or wound NEURO: Awake, alert and oriented appropriately.  No apparent focal neuro deficit. PSYCH: Calm. Normal affect.   Discharge Instructions  Discharge Instructions    Call MD for:  difficulty breathing, headache or visual disturbances   Complete by: As directed    Call MD for:  extreme fatigue   Complete by: As directed    Call MD for:  persistant dizziness or light-headedness   Complete by: As directed    Call MD for:  severe uncontrolled pain   Complete by: As directed    Call MD for:  temperature >100.4   Complete by: As directed    Diet general   Complete by: As directed    Discharge instructions   Complete by: As directed    It has been a pleasure taking care of you!  You were hospitalized due to pneumonia and COPD exacerbation.  You were treated with antibiotic, steroid and breathing treatments.  With that, your symptoms improved to the point we think it is safe to let you go home primary care doctor.  Urging you on more antibiotic and steroid to complete treatment course.  We also noted that you are iron and vitamin B12 levels are very low.  This could be due to acid reflux medication (pantoprazole).  We gave the iron infusion and vitamin B12 injection.  We are discharging you on oral iron pills and vitamin B12 tablets.  May need vitamin B12 injection every month.  This can be done by your primary care doctor.  Please review your new medication list and the directions on  your medications before you take them.   Take care,   Increase activity slowly   Complete by: As directed      Allergies as of 12/25/2020      Reactions   Ceftriaxone Anaphylaxis, Other (See Comments)   *ROCEPHIN*  "Blow up like a balloon"   Hydroxyzine Shortness Of Breath, Other (See Comments)   Pt states med make her light headed, get sob sxs   Doxycycline Nausea And Vomiting   Lexapro [escitalopram] Other (See Comments)   Pt states med make her dizzy      Medication List    STOP taking these medications   Atrovent HFA 17 MCG/ACT inhaler Generic drug: ipratropium     TAKE these medications   ALPRAZolam 1 MG tablet Commonly known as: XANAX TAKE 1 TABLET BY MOUTH THREE TIMES A DAY AS NEEDED   butalbital-acetaminophen-caffeine 50-325-40 MG tablet Commonly known as: FIORICET TAKE 1 TABLET BY  MOUTH EVERY 4 (FOUR) HOURS AS NEEDED FOR HEADACHE.   cholecalciferol 1000 units tablet Commonly known as: VITAMIN D Take 1,000 Units by mouth daily.   diclofenac sodium 1 % Gel Commonly known as: VOLTAREN Apply 4 g topically as needed (for pain).   ferrous sulfate 325 (65 FE) MG EC tablet Take 1 tablet (325 mg total) by mouth 2 (two) times daily.   fluticasone 50 MCG/ACT nasal spray Commonly known as: FLONASE USE 2 SPRAYS INTO EACH NOSTRIL ONCE DAILY**REPEAT FOR 5 DAYS THEN STOP   ipratropium-albuterol 0.5-2.5 (3) MG/3ML Soln Commonly known as: DUONEB Inhale 3 mLs into the lungs every 6 (six) hours as needed (for SOB, wheeze or cough). What changed:   when to take this  reasons to take this   Klor-Con M20 20 MEQ tablet Generic drug: potassium chloride SA TAKE 1 TABLET BY MOUTH EVERY DAY   lactulose (encephalopathy) 10 GM/15ML Soln Commonly known as: CHRONULAC Take 45 mLs (30 g total) by mouth See admin instructions. TAKE 45 MILLILITERS BY MOUTH 3 TIMES A DAY AS NEEDED FOR CONSTIPATION   levocetirizine 5 MG tablet Commonly known as: XYZAL Take 1 tablet (5 mg  total) by mouth every evening.   levofloxacin 750 MG tablet Commonly known as: Levaquin Take 1 tablet (750 mg total) by mouth every other day for 4 days.   linaclotide 72 MCG capsule Commonly known as: Linzess Take 1 capsule (72 mcg total) by mouth daily before breakfast.   Livalo 2 MG Tabs Generic drug: Pitavastatin Calcium TAKE 1 TABLET BY MOUTH EVERY DAY   montelukast 10 MG tablet Commonly known as: SINGULAIR TAKE 1 TABLET BY MOUTH EVERYDAY AT BEDTIME   nortriptyline 25 MG capsule Commonly known as: PAMELOR TAKE 2 CAPSULES BY MOUTH AT BEDTIME   Omega 3 1200 MG Caps Take 1,200 mg by mouth every morning.   ondansetron 8 MG tablet Commonly known as: ZOFRAN TAKE 1 TABLET BY MOUTH EVERY 8 HOURS AS NEEDED FOR NAUSEA What changed:   reasons to take this  additional instructions   OXYGEN Inhale 3 L into the lungs See admin instructions. Uses when needed during the day, uses continuous throughout the night   pantoprazole 40 MG tablet Commonly known as: PROTONIX TAKE 1 TABLET BY MOUTH EVERY DAY   polyethylene glycol 17 g packet Commonly known as: MIRALAX / GLYCOLAX Take 17 g by mouth daily as needed for severe constipation (if not improved with lactulose).   predniSONE 20 MG tablet Commonly known as: DELTASONE Take 2 tablets (40 mg total) by mouth daily with breakfast for 3 days. Start taking on: December 26, 2020   [name] 60 MG/ML Sosy injection Generic drug: denosumab Inject 60 mg into the skin every 6 (six) months.   Spiriva HandiHaler 18 MCG inhalation capsule Generic drug: tiotropium Place 1 capsule (18 mcg total) into inhaler and inhale daily.   Symbicort 160-4.5 MCG/ACT inhaler Generic drug: budesonide-formoterol INHALE 2 PUFFS INTO THE LUNGS TWICE A DAY   Ventolin HFA 108 (90 Base) MCG/ACT inhaler Generic drug: albuterol INHALE 2 PUFFS INTO THE LUNGS EVERY 4 HOURS AS NEEDED FOR WHEEZE OR FOR SHORTNESS OF BREATH   vitamin B-12 1000 MCG tablet Commonly  known as: CYANOCOBALAMIN Take 1 tablet (1,000 mcg total) by mouth daily.   vitamin C 500 MG tablet Commonly known as: ASCORBIC ACID Take 500 mg by mouth daily.       Consultations:  None  Procedures/Studies:  2D Echo on on 12/24/2020 1. Left ventricular ejection  fraction, by estimation, is 55 to 60%. The  left ventricle has normal function. The left ventricle has no regional  wall motion abnormalities. Left ventricular diastolic parameters are  consistent with Grade II diastolic  dysfunction (pseudonormalization). Elevated left ventricular end-diastolic  pressure. The average left ventricular global longitudinal strain is -19.8  %. The global longitudinal strain is normal.  2. Right ventricular systolic function is normal. The right ventricular  size is normal.  3. The mitral valve is normal in structure. No evidence of mitral valve  regurgitation. No evidence of mitral stenosis.  4. The aortic valve is tricuspid. There is severe calcifcation of the  aortic valve. There is severe thickening of the aortic valve. Aortic valve  regurgitation is not visualized. Moderate aortic valve stenosis.  5. The inferior vena cava is normal in size with greater than 50%  respiratory variability, suggesting right atrial pressure of 3 mmHg.    DG Chest Port 1 View  Result Date: 12/23/2020 CLINICAL DATA:  Shortness of breath and weakness EXAM: PORTABLE CHEST 1 VIEW COMPARISON:  04/22/2020 FINDINGS: Cardiac shadow is stable. Aortic calcifications are noted. Left lung is well aerated and clear. Right lung demonstrates patchy basilar infiltrate new from the prior exam. Postsurgical changes in the right axilla are noted. IMPRESSION: Right basilar infiltrate. Electronically Signed   By: Inez Catalina M.D.   On: 12/23/2020 01:23   ECHOCARDIOGRAM COMPLETE  Result Date: 12/24/2020    ECHOCARDIOGRAM REPORT   Patient Name:   ALAYSHIA MARINI Date of Exam: 12/24/2020 Medical Rec #:  [id number]        Height:       62.0 in Accession #:    [id number]      Weight:       110.0 lb Date of Birth:  Feb 28, 1946       BSA:          1.483 m Patient Age:    81 years        BP:           140/58 mmHg Patient Gender: F               HR:           85 bpm. Exam Location:  Inpatient Procedure: 2D Echo, 3D Echo, Cardiac Doppler, Color Doppler and Strain Analysis Indications:    Elevated Troponin  History:        Patient has prior history of Echocardiogram examinations, most                 recent 01/09/2020. CHF, CAD and NSTEMI, COPD, Pulmonary                 HTN and Breast Cancer, Arrythmias:NSVT, Signs/Symptoms:Shortness                 of Breath; Risk Factors:Dyslipidemia.  Sonographer:    Darlina Sicilian RDCS Referring Phys: [id number] [place]  1. Left ventricular ejection fraction, by estimation, is 55 to 60%. The left ventricle has normal function. The left ventricle has no regional wall motion abnormalities. Left ventricular diastolic parameters are consistent with Grade II diastolic dysfunction (pseudonormalization). Elevated left ventricular end-diastolic pressure. The average left ventricular global longitudinal strain is -19.8 %. The global longitudinal strain is normal.  2. Right ventricular systolic function is normal. The right ventricular size is normal.  3. The mitral valve is normal in structure. No evidence of mitral valve regurgitation. No evidence of mitral stenosis.  4. The  aortic valve is tricuspid. There is severe calcifcation of the aortic valve. There is severe thickening of the aortic valve. Aortic valve regurgitation is not visualized. Moderate aortic valve stenosis.  5. The inferior vena cava is normal in size with greater than 50% respiratory variability, suggesting right atrial pressure of 3 mmHg. FINDINGS  Left Ventricle: Left ventricular ejection fraction, by estimation, is 55 to 60%. The left ventricle has normal function. The left ventricle has no regional wall motion abnormalities.  The average left ventricular global longitudinal strain is -19.8 %. The global longitudinal strain is normal. 3D left ventricular ejection fraction analysis performed but not reported based on interpreter judgement due to suboptimal quality. The left ventricular internal cavity size was normal in size. There is no left ventricular hypertrophy. Left ventricular diastolic parameters are consistent with Grade II diastolic dysfunction (pseudonormalization). Elevated left ventricular end-diastolic pressure. Right Ventricle: The right ventricular size is normal. No increase in right ventricular wall thickness. Right ventricular systolic function is normal. Left Atrium: Left atrial size was normal in size. Right Atrium: Right atrial size was normal in size. Pericardium: There is no evidence of pericardial effusion. Mitral Valve: The mitral valve is normal in structure. No evidence of mitral valve regurgitation. No evidence of mitral valve stenosis. Tricuspid Valve: The tricuspid valve is normal in structure. Tricuspid valve regurgitation is mild . No evidence of tricuspid stenosis. Aortic Valve: AS has progressed since echo done 01/09/20. The aortic valve is tricuspid. There is severe calcifcation of the aortic valve. There is severe thickening of the aortic valve. Aortic valve regurgitation is not visualized. Moderate aortic stenosis is present. Aortic valve mean gradient measures 26.8 mmHg. Aortic valve peak gradient measures 43.7 mmHg. Aortic valve area, by VTI measures 1.29 cm. Pulmonic Valve: The pulmonic valve was normal in structure. Pulmonic valve regurgitation is not visualized. No evidence of pulmonic stenosis. Aorta: The aortic root is normal in size and structure. Venous: The inferior vena cava is normal in size with greater than 50% respiratory variability, suggesting right atrial pressure of 3 mmHg. IAS/Shunts: No atrial level shunt detected by color flow Doppler.  LEFT VENTRICLE PLAX 2D LVIDd:         4.30  cm  Diastology LVIDs:         3.30 cm  LV e' medial:    5.98 cm/s LV PW:         0.90 cm  LV E/e' medial:  17.4 LV IVS:        1.00 cm  LV e' lateral:   7.51 cm/s LVOT diam:     2.20 cm  LV E/e' lateral: 13.9 LV SV:         88 LV SV Index:   59       2D Longitudinal Strain LVOT Area:     3.80 cm 2D Strain GLS Avg:     -19.8 %  RIGHT VENTRICLE RV S prime:     18.00 cm/s TAPSE (M-mode): 2.4 cm LEFT ATRIUM             Index       RIGHT ATRIUM           Index LA diam:        3.60 cm 2.43 cm/m  RA Area:     13.10 cm LA Vol (A2C):   41.6 ml 28.06 ml/m RA Volume:   29.90 ml  20.17 ml/m LA Vol (A4C):   36.6 ml 24.68 ml/m LA Biplane Vol: 39.1 ml  26.37 ml/m  AORTIC VALVE AV Area (Vmax):    1.25 cm AV Area (Vmean):   1.13 cm AV Area (VTI):     1.29 cm AV Vmax:           330.50 cm/s AV Vmean:          243.250 cm/s AV VTI:            0.682 m AV Peak Grad:      43.7 mmHg AV Mean Grad:      26.8 mmHg LVOT Vmax:         109.00 cm/s LVOT Vmean:        72.300 cm/s LVOT VTI:          0.231 m LVOT/AV VTI ratio: 0.34  AORTA Ao Root diam: 2.60 cm Ao Asc diam:  3.10 cm MITRAL VALVE MV Area (PHT): 3.23 cm     SHUNTS MV Decel Time: 235 msec     Systemic VTI:  0.23 m MV E velocity: 104.20 cm/s  Systemic Diam: 2.20 cm MV A velocity: 123.00 cm/s MV E/A ratio:  0.85 Jenkins Rouge MD Electronically signed by Jenkins Rouge MD Signature Date/Time: 12/24/2020/4:19:10 PM    Final         The results of significant diagnostics from this hospitalization (including imaging, microbiology, ancillary and laboratory) are listed below for reference.     Microbiology: Recent Results (from the past 240 hour(s))  Blood Culture (routine x 2)     Status: None (Preliminary result)   Collection Time: 12/23/20  1:42 AM   Specimen: BLOOD  Result Value Ref Range Status   Specimen Description   Final    BLOOD LEFT WRIST Performed at [facility] [address] [place], [place] [address]    Special Requests   Final     BOTTLES DRAWN AEROBIC AND ANAEROBIC Blood Culture adequate volume Performed at [facility] [address], [place], [place] [address]    Culture   Final    NO GROWTH 2 DAYS Performed at [facility] [address] [place], [place] [address]    Report Status PENDING  Incomplete  Resp Panel by RT-PCR (Flu A&B, Covid) Nasopharyngeal Swab     Status: None   Collection Time: 12/23/20  1:48 AM   Specimen: Nasopharyngeal Swab; Nasopharyngeal(NP) swabs in vial transport medium  Result Value Ref Range Status   SARS Coronavirus 2 by RT PCR NEGATIVE NEGATIVE Final    Comment: (NOTE) SARS-CoV-2 target nucleic acids are NOT DETECTED.  The SARS-CoV-2 RNA is generally detectable in upper respiratory specimens during the acute phase of infection. The lowest concentration of SARS-CoV-2 viral copies this assay can detect is 138 copies/mL. A negative result does not preclude SARS-Cov-2 infection and should not be used as the sole basis for treatment or other patient management decisions. A negative result may occur with  improper specimen collection/handling, submission of specimen other than nasopharyngeal swab, presence of viral mutation(s) within the areas targeted by this assay, and inadequate number of viral copies(<138 copies/mL). A negative result must be combined with clinical observations, patient history, and epidemiological information. The expected result is Negative.  Fact Sheet for Patients:  EntrepreneurPulse.com.au  Fact Sheet for Healthcare Providers:  IncredibleEmployment.be  This test is no t yet approved or cleared by the [place] FDA and  has been authorized for detection and/or diagnosis of SARS-CoV-2 by FDA under an Emergency Use Authorization (EUA). This EUA will remain  in effect (meaning this test can be used) for the duration of the COVID-19 declaration under Section 564(b)(1) of the Act,  21 U.S.C.section 360bbb-3(b)(1), unless the authorization is terminated  or revoked sooner.       Influenza A by PCR NEGATIVE NEGATIVE Final   Influenza B by PCR NEGATIVE NEGATIVE Final    Comment: (NOTE) The Xpert Xpress SARS-CoV-2/FLU/RSV plus assay is intended as an aid in the diagnosis of influenza from Nasopharyngeal swab specimens and should not be used as a sole basis for treatment. Nasal washings and aspirates are unacceptable for Xpert Xpress SARS-CoV-2/FLU/RSV testing.  Fact Sheet for Patients: EntrepreneurPulse.com.au  Fact Sheet for Healthcare Providers: IncredibleEmployment.be  This test is not yet approved or cleared by the [place] FDA and has been authorized for detection and/or diagnosis of SARS-CoV-2 by FDA under an Emergency Use Authorization (EUA). This EUA will remain in effect (meaning this test can be used) for the duration of the COVID-19 declaration under Section 564(b)(1) of the Act, 21 U.S.C. section 360bbb-3(b)(1), unless the authorization is terminated or revoked.  Performed at [facility], [place] [address], [place], [place] [address]   Urine culture     Status: Abnormal   Collection Time: 12/23/20  3:43 AM   Specimen: In/Out Cath Urine  Result Value Ref Range Status   Specimen Description   Final    IN/OUT CATH URINE Performed at [facility] [address], [place], [place] [address]    Special Requests   Final    NONE Performed at [facility], [place] [address] [place], [place] [address]    Culture MULTIPLE SPECIES PRESENT, SUGGEST RECOLLECTION (A)  Final   Report Status 12/24/2020 FINAL  Final  Blood Culture (routine x 2)     Status: None (Preliminary result)   Collection Time: 12/23/20  4:08 AM   Specimen: BLOOD  Result Value Ref Range Status   Specimen Description   Final    BLOOD BLOOD LEFT FOREARM Performed at [facility] [address], [place], [place] [address]    Special Requests   Final    BOTTLES DRAWN AEROBIC AND ANAEROBIC BCAV Performed at [facility], [place] [address], [place], [place] [address]    Culture   Final    NO GROWTH 2 DAYS Performed at [facility] [address] [place],  [address]    Report Status PENDING  Incomplete     Labs:  CBC: Recent Labs  Lab 12/23/20 0141 12/24/20 0355 12/25/20 0304  WBC 13.9* 13.3* 9.7  NEUTROABS 12.6*  --   --   HGB 13.2 11.2* 10.6*  HCT 41.4 35.4* 34.0*  MCV 99.8 101.1* 101.5*  PLT 200 149* 173   BMP &GFR Recent Labs  Lab 12/23/20 0141 12/23/20 0408 12/24/20 0355 12/25/20 0304  NA 138 141 139 138  K 4.6 4.0 4.2 3.7  CL 98 100 101 99  CO2 30 33* 32 32  GLUCOSE 85 91 130* 134*  BUN 17 17 16 12   CREATININE 1.18* 1.08* 0.84 0.74  CALCIUM 9.4 9.2 9.2 9.2  MG  --   --  2.2 1.8  PHOS  --   --   --  1.9*   Estimated Creatinine Clearance: 48.1 mL/min (by C-G formula based on SCr of 0.74 mg/dL). Liver & Pancreas: Recent Labs  Lab 12/23/20 0141 12/25/20 0304  AST 27  --   ALT 14  --  ALKPHOS 46  --   BILITOT 0.3  --   PROT 7.4  --   ALBUMIN 4.1 3.2*   No results for input(s): LIPASE, AMYLASE in the last 168 hours. No results for input(s): AMMONIA in the last 168 hours. Diabetic: No results for input(s): HGBA1C in the last 72 hours. No results for input(s): GLUCAP in the last 168 hours. Cardiac Enzymes: No results for input(s): CKTOTAL, CKMB, CKMBINDEX, TROPONINI in the last 168 hours. No results for input(s): PROBNP in the last 8760 hours. Coagulation Profile: Recent Labs  Lab 12/23/20 0141  INR 1.0   Thyroid Function Tests: No results for input(s): TSH, T4TOTAL, FREET4, T3FREE, THYROIDAB in the last 72 hours. Lipid Profile: No results for input(s): CHOL, HDL, LDLCALC, TRIG, CHOLHDL, LDLDIRECT in the last 72 hours. Anemia Panel: Recent Labs    12/25/20 0304  VITAMINB12  140*  FOLATE 9.2  FERRITIN 74  TIBC 285  IRON 15*  RETICCTPCT 1.5   Urine analysis:    Component Value Date/Time   COLORURINE YELLOW 12/23/2020 0055   APPEARANCEUR CLEAR 12/23/2020 0055   LABSPEC 1.014 12/23/2020 0055   PHURINE 7.0 12/23/2020 0055   GLUCOSEU NEGATIVE 12/23/2020 0055   GLUCOSEU NEGATIVE 12/19/2013 1127   HGBUR NEGATIVE 12/23/2020 0055   BILIRUBINUR NEGATIVE 12/23/2020 0055   KETONESUR NEGATIVE 12/23/2020 0055   PROTEINUR 100 (A) 12/23/2020 0055   UROBILINOGEN 0.2 12/19/2013 1127   NITRITE NEGATIVE 12/23/2020 0055   LEUKOCYTESUR NEGATIVE 12/23/2020 0055   Sepsis Labs: Invalid input(s): PROCALCITONIN, LACTICIDVEN   Time coordinating discharge: 40 minutes  SIGNED:  Mercy Riding, MD  [facility] 12/25/2020, 3:30 PM  If 7PM-7AM, please contact night-coverage [email or web address]

## 2020-12-25 NOTE — Evaluation (Signed)
Physical Therapy Evaluation Patient Details Name: [name] MRN: [id number] DOB: 1945/10/11 Today's Date: 12/25/2020   History of Present Illness  [name] is a 75 y.o. female with medical history significant for CHF, COPD on 3L O2 baseline, CAD with current c/o fever, SOB and cough.  She reports she developed fever and chills associated with increased shortness of breath, dry cough which is occurring more frequently than her baseline cough, also having some back pain radiating into the left arm. Found to have pneumonia.  Clinical Impression  Pt from home with daughter, 1 STE single floor home and using 3L O2 at night only. Pt currently ambulates 2 laps around the floor with continual conversation and reports minimal SOB. Pt with slightly narrow BOS with ambulation. Pt able to perform sudden stop, speed changes, cervical head turns and turns without LOB. Pt with minimal veering with cervical head turns, not requiring any assistance. Attempted SpO2 read but due to poor signal, unable to obtain- pt reporting minimal SOB and able to continue conversation without difficulty. Pt at baseline with mobility, no acute PT needs identified at this time.    Follow Up Recommendations No PT follow up    Equipment Recommendations  None recommended by PT    Recommendations for Other Services       Precautions / Restrictions Precautions Precautions: None Precaution Comments: monitor sats Restrictions Weight Bearing Restrictions: No      Mobility  Bed Mobility Overal bed mobility: Modified Independent     Transfers Overall transfer level: Independent Equipment used: None   Ambulation/Gait Ambulation/Gait assistance: Supervision Gait Distance (Feet): 720 Feet Assistive device: None Gait Pattern/deviations: WFL(Within Functional Limits);Narrow base of support Gait velocity: slightly decreased   General Gait Details: slightly narrow BOS, step through gait pattern, completes speed  chages, sudden stop, turns, cervical rotation without LOB, minimal veering with cervical head turns without LOB, able to continue conversation with minimal SOB report  Stairs            Wheelchair Mobility    Modified Rankin (Stroke Patients Only)       Balance Overall balance assessment: No apparent balance deficits (not formally assessed)        Pertinent Vitals/Pain Pain Assessment: No/denies pain    Home Living Family/patient expects to be discharged to:: Private residence Living Arrangements: Children (DTR) Available Help at Discharge: Family;Available 24 hours/day Type of Home: House Home Access: Stairs to enter   [employer] of Steps: 1 Home Layout: One level Home Equipment: Cane - single point      Prior Function Level of Independence: Independent;Independent with assistive device(s)  Comments: 3L O2 baseline, independent with ADLs and community ambulation, denies falls, reports using SPC ~1x per month     Hand Dominance   Dominant Hand: Right    Extremity/Trunk Assessment   Upper Extremity Assessment Upper Extremity Assessment: Defer to OT evaluation    Lower Extremity Assessment Lower Extremity Assessment: Overall WFL for tasks assessed    Cervical / Trunk Assessment Cervical / Trunk Assessment: Normal  Communication   Communication: No difficulties  Cognition Arousal/Alertness: Awake/alert Behavior During Therapy: WFL for tasks assessed/performed Overall Cognitive Status: Within Functional Limits for tasks assessed     General Comments General comments (skin integrity, edema, etc.): Pt on 4L, poor signal preventing Sp2 reading once back in room, pt conversational with minimal SOB reports    Exercises     Assessment/Plan    PT Assessment Patent does not need any further  PT services  PT Problem List         PT Treatment Interventions      PT Goals (Current goals can be found in the Care Plan section)  Acute Rehab PT  Goals Patient Stated Goal: home PT Goal Formulation: With patient Time For Goal Achievement: 12/25/20 Potential to Achieve Goals: Good    Frequency     Barriers to discharge        Co-evaluation PT/OT/SLP Co-Evaluation/Treatment: Yes Reason for Co-Treatment: To address functional/ADL transfers PT goals addressed during session: Mobility/safety with mobility;Balance OT goals addressed during session: ADL's and self-care       AM-PAC PT "6 Clicks" Mobility  Outcome Measure Help needed turning from your back to your side while in a flat bed without using bedrails?: None Help needed moving from lying on your back to sitting on the side of a flat bed without using bedrails?: None Help needed moving to and from a bed to a chair (including a wheelchair)?: None Help needed standing up from a chair using your arms (e.g., wheelchair or bedside chair)?: None Help needed to walk in hospital room?: None Help needed climbing 3-5 steps with a railing? : None 6 Click Score: 24    End of Session Equipment Utilized During Treatment: Oxygen Activity Tolerance: Patient tolerated treatment well Patient left: in bed;with call bell/phone within reach Nurse Communication: Mobility status PT Visit Diagnosis: Other abnormalities of gait and mobility (R26.89)    Time: 0277-4128 PT Time Calculation (min) (ACUTE ONLY): 22 min   Charges:   PT Evaluation $PT Eval Low Complexity: 1 Low           Tori Daishaun Ayre PT, DPT 12/25/20, 10:40 AM

## 2020-12-25 NOTE — Evaluation (Signed)
Occupational Therapy Evaluation Patient Details Name: [name] MRN: [id number] DOB: 07-01-1946 Today's Date: 12/25/2020    History of Present Illness [name] is a 75 y.o. female with medical history significant for CHF, COPD on 3L O2 baseline, CAD with current c/o fever, SOB and cough.  She reports she developed fever and chills associated with increased shortness of breath, dry cough which is occurring more frequently than her baseline cough, also having some back pain radiating into the left arm. Found to have pneumonia.   Clinical Impression   Patient lives with her daughter in a single level house with 1 small step to enter. Patient is independent at baseline with self care and mobility, will use cane if feeling unsteady "I haven't used it yet this year." Patient reports only wearing 3L O2 at night. Patient demonstrated independence with functional ambulation, balance and self care tasks without any physical assistance. Attempted O2 reading however unable to pick up wave form, patient reporting very minimal shortness of breath. No further acute OT needs, will sign off at this time. Please re-consult if new needs arise.    Follow Up Recommendations  No OT follow up    Equipment Recommendations  None recommended by OT       Precautions / Restrictions Precautions Precautions: None Precaution Comments: monitor sats Restrictions Weight Bearing Restrictions: No      Mobility Bed Mobility Overal bed mobility: Modified Independent                  Transfers Overall transfer level: Independent                    Balance Overall balance assessment: Mild deficits observed, not formally tested                                         ADL either performed or assessed with clinical judgement   ADL Overall ADL's : Independent                                       General ADL Comments: patient demonstrates functional  ambulation, transfers and LB ADLs without physical assistance                  Pertinent Vitals/Pain Pain Assessment: No/denies pain     Hand Dominance Right   Extremity/Trunk Assessment Upper Extremity Assessment Upper Extremity Assessment: Overall WFL for tasks assessed   Lower Extremity Assessment Lower Extremity Assessment: Defer to PT evaluation   Cervical / Trunk Assessment Cervical / Trunk Assessment: Normal   Communication Communication Communication: No difficulties   Cognition Arousal/Alertness: Awake/alert Behavior During Therapy: WFL for tasks assessed/performed Overall Cognitive Status: Within Functional Limits for tasks assessed                                     General Comments  patient on 4L, attempt to obtain reading once back in room however poor signal. patient reporting minimal shortness of breath            Home Living Family/patient expects to be discharged to:: Private residence Living Arrangements: Children (DTR) Available Help at Discharge: Family;Available 24 hours/day Type of Home: House Home Access: Stairs to  enter Entrance Stairs-Number of Steps: 1   Home Layout: One level     Bathroom Shower/Tub: Teacher, early years/pre: Standard     Home Equipment: Cane - single point          Prior Functioning/Environment Level of Independence: Independent        Comments: 3L O2 baseline, independent with ADLs and community ambulation        OT Problem List: Decreased activity tolerance;Cardiopulmonary status limiting activity         OT Goals(Current goals can be found in the care plan section) Acute Rehab OT Goals Patient Stated Goal: home OT Goal Formulation: All assessment and education complete, DC therapy             Co-evaluation PT/OT/SLP Co-Evaluation/Treatment: Yes Reason for Co-Treatment: To address functional/ADL transfers   OT goals addressed during session: ADL's and  self-care      AM-PAC OT "6 Clicks" Daily Activity     Outcome Measure Help from another person eating meals?: None Help from another person taking care of personal grooming?: None Help from another person toileting, which includes using toliet, bedpan, or urinal?: None Help from another person bathing (including washing, rinsing, drying)?: None Help from another person to put on and taking off regular upper body clothing?: None Help from another person to put on and taking off regular lower body clothing?: None 6 Click Score: 24   End of Session Equipment Utilized During Treatment: Oxygen Nurse Communication: Mobility status  Activity Tolerance: Patient tolerated treatment well Patient left: in bed;with call bell/phone within reach  OT Visit Diagnosis: Other abnormalities of gait and mobility (R26.89)                Time: 5859-2924 OT Time Calculation (min): 22 min Charges:  OT General Charges $OT Visit: 1 Visit OT Evaluation $OT Eval Low Complexity: 1 Low  Delbert Phenix OT OT pager: [place] 12/25/2020, 10:33 AM

## 2020-12-28 LAB — CULTURE, BLOOD (ROUTINE X 2)
Culture: NO GROWTH
Culture: NO GROWTH
Special Requests: ADEQUATE

## 2020-12-31 ENCOUNTER — Other Ambulatory Visit: Payer: Self-pay | Admitting: Internal Medicine

## 2020-12-31 ENCOUNTER — Ambulatory Visit: Payer: Medicare Other | Admitting: Internal Medicine

## 2020-12-31 ENCOUNTER — Telehealth: Payer: Self-pay | Admitting: Internal Medicine

## 2020-12-31 DIAGNOSIS — J42 Unspecified chronic bronchitis: Secondary | ICD-10-CM

## 2020-12-31 DIAGNOSIS — J438 Other emphysema: Secondary | ICD-10-CM

## 2020-12-31 MED ORDER — BUDESONIDE-FORMOTEROL FUMARATE 160-4.5 MCG/ACT IN AERO: 2.0000 | INHALATION_SPRAY | Freq: Two times a day (BID) | RESPIRATORY_TRACT | 1 refills | Status: DC

## 2020-12-31 NOTE — Telephone Encounter (Signed)
1.Medication Requested: SYMBICORT 160-4.5 MCG/ACT inhaler    2. Pharmacy (Name, Street, [place]): [id number] - , [place] - [address]  3. On Med List: yes   4. Last Visit with PCP: 10-30-20  5. Next visit date with PCP: 01-15-21  Patient states that tiotropium (SPIRIVA HANDIHALER) 18 MCG inhalation capsule is leaving sores on the inside and outside of her mouth. She said that it is difficult to sleep at night. Please advise        Agent: Please be advised that RX refills may take up to 3 business days. We ask that you follow-up with your pharmacy.

## 2021-01-12 ENCOUNTER — Other Ambulatory Visit: Payer: Self-pay | Admitting: Internal Medicine

## 2021-01-12 DIAGNOSIS — F419 Anxiety disorder, unspecified: Secondary | ICD-10-CM

## 2021-01-15 ENCOUNTER — Other Ambulatory Visit: Payer: Self-pay

## 2021-01-15 ENCOUNTER — Ambulatory Visit (INDEPENDENT_AMBULATORY_CARE_PROVIDER_SITE_OTHER): Payer: Medicare Other

## 2021-01-15 ENCOUNTER — Ambulatory Visit (INDEPENDENT_AMBULATORY_CARE_PROVIDER_SITE_OTHER): Payer: Medicare Other | Admitting: Internal Medicine

## 2021-01-15 ENCOUNTER — Ambulatory Visit: Payer: Medicare Other | Admitting: Internal Medicine

## 2021-01-15 ENCOUNTER — Encounter: Payer: Self-pay | Admitting: Internal Medicine

## 2021-01-15 VITALS — BP 108/74 | HR 104 | Temp 97.8°F | Ht 62.0 in | Wt 112.0 lb

## 2021-01-15 DIAGNOSIS — E611 Iron deficiency: Secondary | ICD-10-CM | POA: Diagnosis not present

## 2021-01-15 DIAGNOSIS — I2510 Atherosclerotic heart disease of native coronary artery without angina pectoris: Secondary | ICD-10-CM | POA: Diagnosis not present

## 2021-01-15 DIAGNOSIS — D539 Nutritional anemia, unspecified: Secondary | ICD-10-CM | POA: Diagnosis not present

## 2021-01-15 DIAGNOSIS — D510 Vitamin B12 deficiency anemia due to intrinsic factor deficiency: Secondary | ICD-10-CM | POA: Diagnosis not present

## 2021-01-15 DIAGNOSIS — J189 Pneumonia, unspecified organism: Secondary | ICD-10-CM

## 2021-01-15 DIAGNOSIS — R739 Hyperglycemia, unspecified: Secondary | ICD-10-CM

## 2021-01-15 DIAGNOSIS — I700 Atherosclerosis of aorta: Secondary | ICD-10-CM

## 2021-01-15 LAB — IRON: Iron: 78 ug/dL (ref 42–145)

## 2021-01-15 LAB — CBC WITH DIFFERENTIAL/PLATELET
Basophils Absolute: 0 10*3/uL (ref 0.0–0.1)
Basophils Relative: 0.9 % (ref 0.0–3.0)
Eosinophils Absolute: 0.1 10*3/uL (ref 0.0–0.7)
Eosinophils Relative: 1.9 % (ref 0.0–5.0)
HCT: 38.9 % (ref 36.0–46.0)
Hemoglobin: 12.9 g/dL (ref 12.0–15.0)
Lymphocytes Relative: 21.2 % (ref 12.0–46.0)
Lymphs Abs: 1.2 10*3/uL (ref 0.7–4.0)
MCHC: 33.3 g/dL (ref 30.0–36.0)
MCV: 97.1 fl (ref 78.0–100.0)
Monocytes Absolute: 0.5 10*3/uL (ref 0.1–1.0)
Monocytes Relative: 9.2 % (ref 3.0–12.0)
Neutro Abs: 3.7 10*3/uL (ref 1.4–7.7)
Neutrophils Relative %: 66.8 % (ref 43.0–77.0)
Platelets: 216 10*3/uL (ref 150.0–400.0)
RBC: 4.01 Mil/uL (ref 3.87–5.11)
RDW: 14.5 % (ref 11.5–15.5)
WBC: 5.6 10*3/uL (ref 4.0–10.5)

## 2021-01-15 LAB — FERRITIN: Ferritin: 133.6 ng/mL (ref 10.0–291.0)

## 2021-01-15 LAB — HEMOGLOBIN A1C: Hgb A1c MFr Bld: 5.1 % (ref 4.6–6.5)

## 2021-01-15 MED ORDER — CYANOCOBALAMIN 1000 MCG/ML IJ SOLN
1000.0000 ug | Freq: Once | INTRAMUSCULAR | Status: AC
  Administered 2021-01-15: 1000 ug via INTRAMUSCULAR

## 2021-01-15 NOTE — Progress Notes (Signed)
Subjective:  Patient ID: [name], female    DOB: 1946-09-18  Age: 75 y.o. MRN: [id number]  CC: Follow-up (F/u after hospital release with pneumonia), COPD, and Anemia  This visit occurred during the SARS-CoV-2 public health emergency.  Safety protocols were in place, including screening questions prior to the visit, additional usage of staff PPE, and extensive cleaning of exam room while observing appropriate contact time as indicated for disinfecting solutions.    HPI [name] presents for f/up -   She was recently admitted for respiratory compromise and was found to have pneumonia.  During her admission she was also anemic and found to have a low B12 level and a low iron level.  She tells me she is taking an oral B12 supplement.  She thinks they gave her an injection of B12 and an iron infusion in the hospital.  She says her shortness of breath has improved.  She denies cough, fever, chills, hemoptysis, or night sweats.  Outpatient Medications Prior to Visit  Medication Sig Dispense Refill  . ALPRAZolam (XANAX) 1 MG tablet TAKE 1 TABLET BY MOUTH THREE TIMES A DAY AS NEEDED 90 tablet 1  . budesonide-formoterol (SYMBICORT) 160-4.5 MCG/ACT inhaler Inhale 2 puffs into the lungs in the morning and at bedtime. 30.6 each 1  . butalbital-acetaminophen-caffeine (FIORICET) 50-325-40 MG tablet TAKE 1 TABLET BY MOUTH EVERY 4 (FOUR) HOURS AS NEEDED FOR HEADACHE. 65 tablet 3  . cholecalciferol (VITAMIN D) 1000 units tablet Take 1,000 Units by mouth daily.    Marland Kitchen denosumab ([name]) 60 MG/ML SOSY injection Inject 60 mg into the skin every 6 (six) months. 1 mL 1  . diclofenac sodium (VOLTAREN) 1 % GEL Apply 4 g topically as needed (for pain). 100 g 5  . ferrous sulfate 325 (65 FE) MG EC tablet Take 1 tablet (325 mg total) by mouth 2 (two) times daily. 180 tablet 1  . ipratropium-albuterol (DUONEB) 0.5-2.5 (3) MG/3ML SOLN Inhale 3 mLs into the lungs every 6 (six) hours as needed (for SOB, wheeze  or cough). 360 mL 1  . KLOR-CON M20 20 MEQ tablet TAKE 1 TABLET BY MOUTH EVERY DAY 90 tablet 0  . lactulose, encephalopathy, (CHRONULAC) 10 GM/15ML SOLN Take 45 mLs (30 g total) by mouth See admin instructions. TAKE 45 MILLILITERS BY MOUTH 3 TIMES A DAY AS NEEDED FOR CONSTIPATION 1892 mL 1  . levocetirizine (XYZAL) 5 MG tablet Take 1 tablet (5 mg total) by mouth every evening. 90 tablet 1  . linaclotide (LINZESS) 72 MCG capsule Take 1 capsule (72 mcg total) by mouth daily before breakfast. 90 capsule 1  . LIVALO 2 MG TABS TAKE 1 TABLET BY MOUTH EVERY DAY 90 tablet 1  . montelukast (SINGULAIR) 10 MG tablet TAKE 1 TABLET BY MOUTH EVERYDAY AT BEDTIME 90 tablet 1  . nortriptyline (PAMELOR) 25 MG capsule TAKE 2 CAPSULES BY MOUTH AT BEDTIME 180 capsule 0  . Omega 3 1200 MG CAPS Take 1,200 mg by mouth every morning.     . ondansetron (ZOFRAN) 8 MG tablet TAKE 1 TABLET BY MOUTH EVERY 8 HOURS AS NEEDED FOR NAUSEA (Patient taking differently: Take 8 mg by mouth every 8 (eight) hours as needed for nausea.) 30 tablet 2  . OXYGEN-HELIUM IN Inhale 3 L into the lungs See admin instructions. Uses when needed during the day, uses continuous throughout the night    . pantoprazole (PROTONIX) 40 MG tablet TAKE 1 TABLET BY MOUTH EVERY DAY 90 tablet 1  .  VENTOLIN HFA 108 (90 Base) MCG/ACT inhaler INHALE 2 PUFFS INTO THE LUNGS EVERY 4 HOURS AS NEEDED FOR WHEEZE OR FOR SHORTNESS OF BREATH 36 each 2  . vitamin B-12 (CYANOCOBALAMIN) 1000 MCG tablet Take 1 tablet (1,000 mcg total) by mouth daily. 90 tablet 1  . vitamin C (ASCORBIC ACID) 500 MG tablet Take 500 mg by mouth daily.    . fluticasone (FLONASE) 50 MCG/ACT nasal spray USE 2 SPRAYS INTO EACH NOSTRIL ONCE DAILY**REPEAT FOR 5 DAYS THEN STOP (Patient not taking: No sig reported) 16 g 11  . polyethylene glycol (MIRALAX / GLYCOLAX) 17 g packet Take 17 g by mouth daily as needed for severe constipation (if not improved with lactulose). (Patient not taking: Reported on  01/15/2021) 14 each 0  . tiotropium (SPIRIVA HANDIHALER) 18 MCG inhalation capsule Place 1 capsule (18 mcg total) into inhaler and inhale daily. 30 capsule 3   No facility-administered medications prior to visit.    ROS Review of Systems  Constitutional: Negative for appetite change, chills, diaphoresis, fatigue, fever and unexpected weight change.  HENT: Negative.  Negative for sore throat and trouble swallowing.   Respiratory: Positive for shortness of breath. Negative for cough, chest tightness, wheezing and stridor.   Cardiovascular: Negative for chest pain, palpitations and leg swelling.  Gastrointestinal: Negative for abdominal pain, diarrhea, nausea and vomiting.  Genitourinary: Negative.  Negative for difficulty urinating.  Musculoskeletal: Negative.   Skin: Negative.   Neurological: Negative.  Negative for dizziness and weakness.  Hematological: Negative for adenopathy. Does not bruise/bleed easily.  Psychiatric/Behavioral: Negative.     Objective:  BP 108/74 (BP Location: Left Arm, Patient Position: Sitting, Cuff Size: Normal)   Pulse (!) 104   Temp 97.8 F (36.6 C) (Oral)   Ht 5\' 2"  (1.575 m)   Wt 112 lb (50.8 kg)   SpO2 93%   BMI 20.49 kg/m   BP Readings from Last 3 Encounters:  01/15/21 108/74  12/25/20 (!) 153/66  10/30/20 114/72    Wt Readings from Last 3 Encounters:  01/15/21 112 lb (50.8 kg)  12/25/20 114 lb 10.2 oz (52 kg)  10/30/20 113 lb (51.3 kg)    Physical Exam Vitals reviewed.  Constitutional:      General: She is not in acute distress.    Appearance: She is ill-appearing (thin, continuous O2). She is not toxic-appearing or diaphoretic.  HENT:     Nose: Nose normal.     Mouth/Throat:     Mouth: Mucous membranes are moist.  Eyes:     General: No scleral icterus.    Conjunctiva/sclera: Conjunctivae normal.  Cardiovascular:     Rate and Rhythm: Normal rate and regular rhythm.     Heart sounds: No murmur heard.   Pulmonary:     Effort:  Accessory muscle usage present. No tachypnea.     Breath sounds: No decreased breath sounds, wheezing, rhonchi or rales.  Abdominal:     General: Abdomen is flat.     Palpations: There is no mass.     Tenderness: There is no abdominal tenderness. There is no guarding.     Hernia: No hernia is present.  Musculoskeletal:        General: Normal range of motion.     Cervical back: Neck supple.     Right lower leg: No edema.     Left lower leg: No edema.  Lymphadenopathy:     Cervical: No cervical adenopathy.  Skin:    General: Skin is warm and dry.  Coloration: Skin is not pale.  Neurological:     General: No focal deficit present.     Mental Status: She is alert.  Psychiatric:        Mood and Affect: Mood normal.        Behavior: Behavior normal.     Lab Results  Component Value Date   WBC 5.6 01/15/2021   HGB 12.9 01/15/2021   HCT 38.9 01/15/2021   PLT 216.0 01/15/2021   GLUCOSE 134 (H) 12/25/2020   CHOL 214 (H) 05/06/2020   TRIG 114 05/06/2020   HDL 76 05/06/2020   LDLCALC 116 (H) 05/06/2020   ALT 14 12/23/2020   AST 27 12/23/2020   NA 138 12/25/2020   K 3.7 12/25/2020   CL 99 12/25/2020   CREATININE 0.74 12/25/2020   BUN 12 12/25/2020   CO2 32 12/25/2020   TSH 1.76 05/06/2020   INR 1.0 12/23/2020   HGBA1C 5.1 01/15/2021    DG Chest Port 1 View  Result Date: 12/23/2020 CLINICAL DATA:  Shortness of breath and weakness EXAM: PORTABLE CHEST 1 VIEW COMPARISON:  04/22/2020 FINDINGS: Cardiac shadow is stable. Aortic calcifications are noted. Left lung is well aerated and clear. Right lung demonstrates patchy basilar infiltrate new from the prior exam. Postsurgical changes in the right axilla are noted. IMPRESSION: Right basilar infiltrate. Electronically Signed   By: Inez Catalina M.D.   On: 12/23/2020 01:23   DG Chest 2 View  Result Date: 01/16/2021 CLINICAL DATA:  Respiratory failure.  Recent diagnosis of pneumonia. EXAM: CHEST - 2 VIEW COMPARISON:  Single view of  the chest 12/23/2020. PA and lateral chest 09/11/2018. CT chest 01/01/2015. FINDINGS: The lungs are emphysematous with coarsening of the pulmonary interstitium and some chronic scarring in both the right middle lobe and lingula. No consolidative process, pneumothorax or effusion. Heart size is normal. Aortic atherosclerosis. Surgical clips right axilla noted. No acute or focal bony abnormality. IMPRESSION: No acute disease. Aortic Atherosclerosis (ICD10-I70.0) and Emphysema (ICD10-J43.9). Electronically Signed   By: Inge Rise M.D.   On: 01/16/2021 14:19    Assessment & Plan:   [name] was seen today for follow-up, copd and anemia.  Diagnoses and all orders for this visit:  Pneumonia of right lower lobe due to infectious organism- This has resolved. -     DG Chest 2 View; Future  Hyperglycemia- Her blood sugar is normal. -     Hemoglobin A1c; Future -     Cancel: Fructosamine; Future -     Fructosamine; Future -     Fructosamine -     Hemoglobin A1c  Deficiency anemia- Her blood sugar is normal now. -     CBC with Differential/Platelet; Future -     Iron; Future -     Ferritin; Future -     Ferritin -     Iron -     CBC with Differential/Platelet  Iron deficiency- Improvement noted. 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 -     CBC with Differential/Platelet; Future -     cyanocobalamin ((VITAMIN B-12)) injection 1,000 mcg -     CBC with Differential/Platelet  Atherosclerosis of aorta ([place])- Risk factor  modifications addressed.   I have discontinued [name] "[name]"'s Spiriva HandiHaler. I am also having her maintain her OXYGEN-HELIUM IN, Omega 3, cholecalciferol, vitamin C, fluticasone, ondansetron, diclofenac  sodium, polyethylene glycol, lactulose (encephalopathy), linaclotide, Livalo, montelukast, [place]-Con M20, [name], Ventolin HFA, butalbital-acetaminophen-caffeine, pantoprazole, nortriptyline, levocetirizine, ferrous sulfate, vitamin B-12, ipratropium-albuterol, budesonide-formoterol, and ALPRAZolam. We administered cyanocobalamin.  Meds ordered this encounter  Medications  . cyanocobalamin ((VITAMIN B-12)) injection 1,000 mcg     Follow-up: Return in about 3 months (around 04/16/2021).  Scarlette Calico, MD

## 2021-01-15 NOTE — Patient Instructions (Signed)
[employer] medicine (25th ed., pp. [phone]). [place], [place]: Elsevier.">  Anemia  Anemia is a condition in which there is not enough red blood cells or hemoglobin in the blood. Hemoglobin is a substance in red blood cells that carries oxygen. When you do not have enough red blood cells or hemoglobin (are anemic), your body cannot get enough oxygen and your organs may not work properly. As a result, you may feel very tired or have other problems. What are the causes? Common causes of anemia include:  Excessive bleeding. Anemia can be caused by excessive bleeding inside or outside the body, including bleeding from the intestines or from heavy menstrual periods in females.  Poor nutrition.  Long-lasting (chronic) kidney, thyroid, and liver disease.  Bone marrow disorders, spleen problems, and blood disorders.  Cancer and treatments for cancer.  HIV (human immunodeficiency virus) and AIDS (acquired immunodeficiency syndrome).  Infections, medicines, and autoimmune disorders that destroy red blood cells. What are the signs or symptoms? Symptoms of this condition include:  Minor weakness.  Dizziness.  Headache, or difficulties concentrating and sleeping.  Heartbeats that feel irregular or faster than normal (palpitations).  Shortness of breath, especially with exercise.  Pale skin, lips, and nails, or cold hands and feet.  Indigestion and nausea. Symptoms may occur suddenly or develop slowly. If your anemia is mild, you may not have symptoms. How is this diagnosed? This condition is diagnosed based on blood tests, your medical history, and a physical exam. In some cases, a test may be needed in which cells are removed from the soft tissue inside of a bone and looked at under a microscope (bone marrow biopsy). Your health care provider may also check your stool (feces) for blood and may do additional testing to look for the cause of your bleeding. Other tests may  include:  Imaging tests, such as a CT scan or MRI.  A procedure to see inside your esophagus and stomach (endoscopy).  A procedure to see inside your colon and rectum (colonoscopy). How is this treated? Treatment for this condition depends on the cause. If you continue to lose a lot of blood, you may need to be treated at a hospital. Treatment may include:  Taking supplements of iron, vitamin Q68, or folic acid.  Taking a hormone medicine (erythropoietin) that can help to stimulate red blood cell growth.  Having a blood transfusion. This may be needed if you lose a lot of blood.  Making changes to your diet.  Having surgery to remove your spleen. Follow these instructions at home:  Take over-the-counter and prescription medicines only as told by your health care provider.  Take supplements only as told by your health care provider.  Follow any diet instructions that you were given by your health care provider.  Keep all follow-up visits as told by your health care provider. This is important. Contact a health care provider if:  You develop new bleeding anywhere in the body. Get help right away if:  You are very weak.  You are short of breath.  You have pain in your abdomen or chest.  You are dizzy or feel faint.  You have trouble concentrating.  You have bloody stools, black stools, or tarry stools.  You vomit repeatedly or you vomit up blood. These symptoms may represent a serious problem that is an emergency. Do not wait to see if the symptoms will go away. Get medical help right away. Call your local emergency services (911 in the [place] Do not  drive yourself to the hospital. Summary  Anemia is a condition in which you do not have enough red blood cells or enough of a substance in your red blood cells that carries oxygen (hemoglobin).  Symptoms may occur suddenly or develop slowly.  If your anemia is mild, you may not have symptoms.  This condition is  diagnosed with blood tests, a medical history, and a physical exam. Other tests may be needed.  Treatment for this condition depends on the cause of the anemia. This information is not intended to replace advice given to you by your health care provider. Make sure you discuss any questions you have with your health care provider. Document Revised: 08/21/2019 Document Reviewed: 08/21/2019 Elsevier Patient Education  2021 Elsevier Inc.  

## 2021-01-16 DIAGNOSIS — I700 Atherosclerosis of aorta: Secondary | ICD-10-CM | POA: Insufficient documentation

## 2021-01-19 LAB — FRUCTOSAMINE: Fructosamine: 282 umol/L (ref 205–285)

## 2021-02-01 ENCOUNTER — Other Ambulatory Visit: Payer: Self-pay | Admitting: Internal Medicine

## 2021-02-01 DIAGNOSIS — J449 Chronic obstructive pulmonary disease, unspecified: Secondary | ICD-10-CM

## 2021-02-01 DIAGNOSIS — K7682 Hepatic encephalopathy: Secondary | ICD-10-CM

## 2021-02-01 DIAGNOSIS — K7290 Hepatic failure, unspecified without coma: Secondary | ICD-10-CM

## 2021-02-01 DIAGNOSIS — F409 Phobic anxiety disorder, unspecified: Secondary | ICD-10-CM

## 2021-02-02 ENCOUNTER — Other Ambulatory Visit: Payer: Self-pay | Admitting: Internal Medicine

## 2021-02-02 DIAGNOSIS — J449 Chronic obstructive pulmonary disease, unspecified: Secondary | ICD-10-CM

## 2021-02-02 DIAGNOSIS — J438 Other emphysema: Secondary | ICD-10-CM

## 2021-02-02 DIAGNOSIS — F409 Phobic anxiety disorder, unspecified: Secondary | ICD-10-CM

## 2021-02-02 MED ORDER — IPRATROPIUM-ALBUTEROL 0.5-2.5 (3) MG/3ML IN SOLN
3.0000 mL | Freq: Four times a day (QID) | RESPIRATORY_TRACT | 2 refills | Status: DC | PRN
Start: 2021-02-02 — End: 2022-04-03

## 2021-02-02 MED ORDER — ALBUTEROL SULFATE HFA 108 (90 BASE) MCG/ACT IN AERS: 1.0000 | INHALATION_SPRAY | Freq: Four times a day (QID) | RESPIRATORY_TRACT | 2 refills | Status: DC | PRN

## 2021-02-02 MED ORDER — NORTRIPTYLINE HCL 25 MG PO CAPS: 50.0000 mg | ORAL_CAPSULE | Freq: Every day | ORAL | 0 refills | Status: DC

## 2021-02-09 ENCOUNTER — Other Ambulatory Visit: Payer: Self-pay | Admitting: Internal Medicine

## 2021-02-09 DIAGNOSIS — K7290 Hepatic failure, unspecified without coma: Secondary | ICD-10-CM

## 2021-02-09 DIAGNOSIS — K7682 Hepatic encephalopathy: Secondary | ICD-10-CM

## 2021-02-11 ENCOUNTER — Other Ambulatory Visit: Payer: Self-pay | Admitting: Internal Medicine

## 2021-02-11 DIAGNOSIS — Z1231 Encounter for screening mammogram for malignant neoplasm of breast: Secondary | ICD-10-CM

## 2021-02-11 DIAGNOSIS — G43009 Migraine without aura, not intractable, without status migrainosus: Secondary | ICD-10-CM

## 2021-02-11 MED ORDER — BUTALBITAL-APAP-CAFFEINE 50-325-40 MG PO TABS: 1.0000 | ORAL_TABLET | ORAL | 3 refills | Status: DC | PRN

## 2021-02-12 ENCOUNTER — Other Ambulatory Visit: Payer: Self-pay | Admitting: Internal Medicine

## 2021-02-12 DIAGNOSIS — G43009 Migraine without aura, not intractable, without status migrainosus: Secondary | ICD-10-CM

## 2021-02-16 ENCOUNTER — Other Ambulatory Visit: Payer: Self-pay | Admitting: Internal Medicine

## 2021-02-16 ENCOUNTER — Telehealth: Payer: Self-pay | Admitting: Internal Medicine

## 2021-02-16 DIAGNOSIS — K5904 Chronic idiopathic constipation: Secondary | ICD-10-CM

## 2021-02-16 DIAGNOSIS — G43009 Migraine without aura, not intractable, without status migrainosus: Secondary | ICD-10-CM

## 2021-02-16 MED ORDER — LINACLOTIDE 72 MCG PO CAPS: 72.0000 ug | ORAL_CAPSULE | Freq: Every day | ORAL | 1 refills | Status: DC

## 2021-02-16 NOTE — Telephone Encounter (Signed)
1.Medication Requested: linaclotide (LINZESS) 72 MCG capsule    2. Pharmacy (Name, Street, [place]): [id number] - [place], [place] - [address]  3. On Med List: yes   4. Last Visit with PCP: 01-15-21  5. Next visit date with PCP: 04-09-21  Patient said that she is out of the medication    Agent: Please be advised that RX refills may take up to 3 business days. We ask that you follow-up with your pharmacy.

## 2021-03-11 ENCOUNTER — Other Ambulatory Visit: Payer: Self-pay | Admitting: Internal Medicine

## 2021-03-11 DIAGNOSIS — G43009 Migraine without aura, not intractable, without status migrainosus: Secondary | ICD-10-CM

## 2021-03-11 DIAGNOSIS — F409 Phobic anxiety disorder, unspecified: Secondary | ICD-10-CM

## 2021-03-12 ENCOUNTER — Other Ambulatory Visit: Payer: Self-pay | Admitting: Internal Medicine

## 2021-03-12 DIAGNOSIS — I10 Essential (primary) hypertension: Secondary | ICD-10-CM

## 2021-03-12 DIAGNOSIS — E785 Hyperlipidemia, unspecified: Secondary | ICD-10-CM

## 2021-03-12 DIAGNOSIS — L219 Seborrheic dermatitis, unspecified: Secondary | ICD-10-CM

## 2021-03-12 DIAGNOSIS — I5032 Chronic diastolic (congestive) heart failure: Secondary | ICD-10-CM

## 2021-03-12 DIAGNOSIS — I2510 Atherosclerotic heart disease of native coronary artery without angina pectoris: Secondary | ICD-10-CM

## 2021-03-12 MED ORDER — POTASSIUM CHLORIDE CRYS ER 20 MEQ PO TBCR: 20.0000 meq | EXTENDED_RELEASE_TABLET | Freq: Every day | ORAL | 1 refills | Status: DC

## 2021-03-12 MED ORDER — LIVALO 2 MG PO TABS
1.0000 | ORAL_TABLET | Freq: Every day | ORAL | 1 refills | Status: DC
Start: 2021-03-12 — End: 2021-07-17

## 2021-03-16 ENCOUNTER — Other Ambulatory Visit: Payer: Self-pay | Admitting: Internal Medicine

## 2021-03-16 DIAGNOSIS — F419 Anxiety disorder, unspecified: Secondary | ICD-10-CM

## 2021-03-17 ENCOUNTER — Other Ambulatory Visit: Payer: Self-pay

## 2021-03-17 ENCOUNTER — Other Ambulatory Visit: Payer: Self-pay | Admitting: Internal Medicine

## 2021-03-17 ENCOUNTER — Ambulatory Visit (INDEPENDENT_AMBULATORY_CARE_PROVIDER_SITE_OTHER): Payer: Medicare Other | Admitting: Internal Medicine

## 2021-03-17 ENCOUNTER — Encounter: Payer: Self-pay | Admitting: Internal Medicine

## 2021-03-17 VITALS — BP 116/66 | HR 82 | Ht 62.0 in | Wt 114.8 lb

## 2021-03-17 DIAGNOSIS — I350 Nonrheumatic aortic (valve) stenosis: Secondary | ICD-10-CM

## 2021-03-17 DIAGNOSIS — I5032 Chronic diastolic (congestive) heart failure: Secondary | ICD-10-CM

## 2021-03-17 DIAGNOSIS — I2510 Atherosclerotic heart disease of native coronary artery without angina pectoris: Secondary | ICD-10-CM | POA: Diagnosis not present

## 2021-03-17 DIAGNOSIS — J9611 Chronic respiratory failure with hypoxia: Secondary | ICD-10-CM

## 2021-03-17 DIAGNOSIS — I10 Essential (primary) hypertension: Secondary | ICD-10-CM

## 2021-03-17 NOTE — Patient Instructions (Signed)
Medication Instructions:  No Changes In Medications at this time.  *If you need a refill on your cardiac medications before your next appointment, please call your pharmacy*  Testing/Procedures: Your physician has requested that you have an echocardiogram IN MARCH OF 2023. Echocardiography is a painless test that uses sound waves to create images of your heart. It provides your doctor with information about the size and shape of your heart and how well your heart's chambers and valves are working. You may receive an ultrasound enhancing agent through an IV if needed to better visualize your heart during the echo.This procedure takes approximately one hour. There are no restrictions for this procedure. This will take place at the [address].   Follow-Up: At [facility], you and your health needs are our priority.  As part of our continuing mission to provide you with exceptional heart care, we have created designated Provider Care Teams.  These Care Teams include your primary Cardiologist (physician) and Advanced Practice Providers (APPs -  Physician Assistants and Nurse Practitioners) who all work together to provide you with the care you need, when you need it.  Your next appointment:   MARCH- 2023 RIGHT AFTER ECHO  The format for your next appointment:   In Person  Provider:   K. Mali Hilty, MD  Other Instructions AMBULATORY REFERRAL TO PULMONOLOGY (Dr. Lake Bells) SOMEONE [place]

## 2021-03-17 NOTE — Progress Notes (Signed)
OFFICE NOTE  Chief Complaint:  "Don't feel the same"  Primary Care Physician: [name], MD  HPI:  [name] a pleasant 75 year old female with a history of COPD and severe pulmonary hypertension. Recently she was put on home oxygen and has been on an oxygen concentrator, which has made a marked improvement in her ability to get around. She has previously seen Dr. Lamonte Sakai with pulmonology; however, that did not go too well and she has basically given up on any further treatments for COPD other than her oxygen. She still uses nebulizers as needed when she is tight, and I recommended Claritin for seasonal allergies today. As you know, she also has mild aortic stenosis and we are continuing to follow that. She is a low-risk stress test in November 2012. Unfortunately she was recently admitted for altered mental status, possible Tylenol overdose, respiratory failure and liver failure. All of which seems to have resolved. She seems to be doing pre-well after discharge.  She returns today for followup. She reports doing fairly well. She has seen Dr. Lake Bells in the interim and he is recommended continue current therapies. She is now seeing a different internist, Dr. Ronnald Ramp. He was recently admitted the hospital for pneumonia and treated accordingly. She had troponins which were negative suggesting they're likely is no significant underlying obstructive coronary disease. She reports she's recovered and is back to her base. She is overdue for repeat lipid profile.  I saw [name] back in the office today. Overall she reports she is doing fairly well. She's not been admitted to the hospital recently. She says she has stable shortness of breath. She denies any chest pain. It should be noted that she does have mild aortic stenosis which will last assessed in 2014.  [name] returns today for follow-up. She reports that she is doing fairly well. Fortune she's not been hospitalized. She has  had an episode of COPD exacerbation which was supported with early antibiotics. She denies any worsening chest pain and has pretty stable shortness of breath on oxygen. She is able to ambulate and do most of her activities without any restrictions.  03/31/2016  [name] was seen back today in the office for follow-up. She seems to be doing fairly well although does use her oxygen almost continuously. Saturation was low today although she had heavy fingernail polish on and therefore the reading may not be accurate as it read 88%. She was on oxygen via a concentrator. Blood pressure was stable at 108/74. Weight is been stable. She recently had a repeat echocardiogram which shows stable mild aortic stenosis and normal LV function with some mild improvement in pulmonary pressures in May. She denies any chest pain. Fortune that she's not been admitted to the hospital recently for any pneumonia but did have problems with recent gallbladder disease and had cholecystectomy.  12/17/2016  [name] returns today for follow-up. She's done well over the last year has not been hospitalized. She reports stable shortness of breath. She had mild aortic stenosis which is stable by echo last year and will need to be repeated in May of this year. Her last lipid profile showed total cholesterol 197, HDL C 78, triglycerides 100, an LDL-C 99. We'll need to repeat her lipid profile as well.  06/20/2017  I saw [name] back today for follow-up. She is doing well. We repeated on echo in 01/2017, this demonstrated normal LVEF 60-65%, mild stable aortic stenosis. She denies any worsening shortness of breath  or chest pain. Cholesterol remains elevated. Will plan to recheck that today. She could conceivably increase her Livalo. She reports having had her flu vaccine this year.  08/16/2017  [name] returns today for follow-up.  She seen specifically for oxygen testing.  We tested her room air saturation off of oxygen and it was noted to be 88% at  rest.  We did not do ambulatory testing as she met criteria for oxygen therapy.  She is currently on an oxygen concentrator which we wish to renew.  She needs the oxygen concentrator as she is active and is mobile but requires continuous oxygen therapy -she is on 3 L continuous by nasal cannula.  12/09/2017  [name] returns today for follow-up.  She reports doing fairly well.  She is pleased that she finally got her oxygen concentrator.  Blood pressure is well-controlled today 122/72.  She denies chest pain or worsening shortness of breath.  Should her weight is been stable.  She had a recent lipid profile in December which showed LDL cholesterol of 81 and non-HDL cholesterol of 98.  She does have mild aortic valve stenosis which was assessed by echo last year.  07/03/2018   [name] returns today for follow-up.  Overall she feels like her breathing is done well.  She has not been hospitalized for pulmonary issues.  She denies chest pain or significant shortness of breath.  She continues to use oxygen.  She reports some improvement in her headaches.  Blood pressure remains soft but does not seem to be bothersome for her.  Her last lipid profile shows good control of her cholesterol.  She remains on Livalo.  Echo in 2018 showed mild aortic stenosis.  12/14/2019  [name] is seen today in follow-up.  She continues to be fairly stable from a cardiac standpoint.  She denies any chest pain or shortness of breath.  Her COPD seems to be stable on oxygen.  Blood pressure today is well controlled at 128/78.  Is now been a couple years since her last echo which did show some mild aortic stenosis.  She also has moderate coronary artery disease but does not seem to have had any issues with it.  EKG today shows sinus rhythm at 71. Her most recent lipids were TC 176, TG 92, HDL 76 and LDL 81.  08/26/2020  [name] returns today for follow-up.  So far her COPD seems to be fairly stable on oxygen.  She had a repeat echo last  April which showed stable LVEF of 55 to 60% with normal strain and grade 1 diastolic dysfunction.  Although there was mildly elevated pulmonary pressure the RV systolic function was normal.  There is mild to moderate aortic stenosis which seems fairly stable with mean gradient of 15 and peak gradient 29 mmHg.  Symptomatically she says she is about the same.  She continues to smoke some.  She had an issue with bowel impaction and now is on Linzess.  03/17/2021  [name] is seen today in follow-up.  Unfortunately in March she was hospitalized with what sounds like a pneumonia/sepsis picture.  She also had acute on chronic diastolic heart failure and mild troponin elevation which was chalked up to strain however cardiology was not involved in her visit.  She had a repeat echo which showed EF 55 to 16%, grade 2 diastolic dysfunction and now moderate aortic stenosis.  Her mean gradient has increased from 15 to 26.8 mmHg.  She denies any chest pain or worsening shortness of breath.  She had cut back on her Lasix from 40 mg twice daily down to 40 mg daily because she "pees too much at night".  I felt that this might explain why she may be not feeling as well as she did prior to this hospitalization.  It also could be that she has had some progression of her lung disease.  PMHx:  Past Medical History:  Diagnosis Date   Abnormal LFTs 08/02/2011   Acute liver failure 06/19/2013   Acute renal failure (HCC) 06/19/2013   Acute respiratory failure (HCC) 06/19/2013   Altered mental status 08/01/2011   Angina    Anxiety    Anxiety state, unspecified 12/03/2013   Aortic stenosis    mild   Arthritis    "hands" (02/03/2017)   Breast cancer, right breast (HCC) dx'd 2008   CAD (coronary artery disease) of artery bypass graft 11/06/2013   CAD (coronary artery disease), MI R/O 08/01/2011   PCI in 2006 (bare metal stent, unknown artery) - [place]    CAP (community acquired pneumonia) 09/11/2018   CHF,  acute diastolic, BNP 4k on  admissio 62/01/2760   Chronic bronchitis (HCC)    Chronic respiratory failure (HCC) 02/02/2012   Compression fracture of L1 lumbar vertebra (HCC) 02/02/2012   COPD (chronic obstructive pulmonary disease) (HCC)    oxygen-dependent 4LPM [place]   Coronary artery disease 2006   2 stents w/previous MI   Depressed    Diastolic CHF (HCC)    Dyslipidemia    Family history of adverse reaction to anesthesia    "daughter had c-section; missed twice w/epidural"   GERD (gastroesophageal reflux disease)    Heart murmur    History of blood transfusion    "w/my colon OR"   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   History of nuclear stress test 08/03/2011   attenuation at apex - no perfusion defects    HTN (hypertension) 11/06/2013   Hyperkalemia, on ACE prior to admission 11/11/2011   Hypertension    Hyponatremia 01/31/2012   Migraine    "qod to q couple months since I was 21" (02/03/2017)   Mild aortic stenosis 08/01/2011   AVA 1.69 cm2 (06/02/2011)    Moderate to severe pulmonary hypertension (HCC)    NSTEMI (non-ST elevated myocardial infarction) (HCC) 2006   NSVT (nonsustained ventricular tachycardia) (HCC)    h/o   On home oxygen therapy    "3L just at night; have it available prn duringtheday" (09/11/2018)   Pneumonia    "alot of times" (02/03/2017)   SBO (small bowel obstruction) (HCC) 04/23/2020   PARTIAL     Past Surgical History:  Procedure Laterality Date   BREAST BIOPSY Right 2008   BREAST LUMPECTOMY Right 2008   malignant   CATARACT EXTRACTION W/ INTRAOCULAR LENS  IMPLANT, BILATERAL Bilateral ~ 2013   CESAREAN SECTION  1969; 1971; 1978   CHOLECYSTECTOMY OPEN  2011   COLON SURGERY     COLOSTOMY  11/2007   COLOSTOMY CLOSURE  07/2008   CORONARY ANGIOPLASTY WITH STENT PLACEMENT  2006   "2 stents"   ERCP N/A 09/05/2015   Procedure: ENDOSCOPIC RETROGRADE CHOLANGIOPANCREATOGRAPHY (ERCP);  Surgeon: Meryl Dare, MD;  Location: [facility];  Service: Endoscopy;  Laterality: N/A;   PARTIAL  COLECTOMY  2009   for obstruction: temporary ostomy, later reversed.    RIGHT HEART CATHETERIZATION N/A 09/05/2013   Procedure: RIGHT HEART CATH;  Surgeon: Dolores Patty, MD;  Location: [facility];  Service: Cardiovascular;  Laterality: N/A;  TRANSTHORACIC ECHOCARDIOGRAM  11/12/2011   EF 64-68%, normal systolic function, grade 1 diastolic dysfunction; ventricular septal flattening (D-sign); mild AS; trace-mild MR; LA mildly dilated; RV mod dilated; RA mod dilated; severe pulm HTN; elevated CVP    FAMHx:  Family History  Problem Relation Age of Onset   Schizophrenia Sister    Alzheimer's disease Mother    Hyperlipidemia Brother    Heart disease Sister    Diabetes Sister    Cancer Neg Hx    Stroke Neg Hx    COPD Neg Hx    Depression Neg Hx    Drug abuse Neg Hx    Early death Neg Hx    Hypertension Neg Hx    Kidney disease Neg Hx     SOCHx:   reports that she has been smoking cigarettes. She has a 26.50 pack-year smoking history. She has never used smokeless tobacco. She reports current alcohol use of about 1.0 standard drink of alcohol per week. She reports that she does not use drugs.  ALLERGIES:  Allergies  Allergen Reactions   Ceftriaxone Anaphylaxis and Other (See Comments)    *ROCEPHIN*  "Blow up like a balloon"   Hydroxyzine Shortness Of Breath and Other (See Comments)    Pt states med make her light headed, get sob sxs   Doxycycline Nausea And Vomiting   Lexapro [Escitalopram] Other (See Comments)    Pt states med make her dizzy    ROS: Pertinent items noted in HPI and remainder of comprehensive ROS otherwise negative.  HOME MEDS: Current Outpatient Medications  Medication Sig Dispense Refill   albuterol (VENTOLIN HFA) 108 (90 Base) MCG/ACT inhaler Inhale 1-2 puffs into the lungs every 6 (six) hours as needed for wheezing or shortness of breath. 36 each 2   ALPRAZolam (XANAX) 1 MG tablet TAKE 1 TABLET BY MOUTH THREE TIMES A DAY AS NEEDED 90 tablet 3    budesonide-formoterol (SYMBICORT) 160-4.5 MCG/ACT inhaler Inhale 2 puffs into the lungs in the morning and at bedtime. 30.6 each 1   butalbital-acetaminophen-caffeine (FIORICET) 50-325-40 MG tablet Take 1 tablet by mouth every 4 (four) hours as needed for headache. 65 tablet 3   cholecalciferol (VITAMIN D) 1000 units tablet Take 1,000 Units by mouth daily.     denosumab ([name]) 60 MG/ML SOSY injection Inject 60 mg into the skin every 6 (six) months. 1 mL 1   diclofenac sodium (VOLTAREN) 1 % GEL Apply 4 g topically as needed (for pain). 100 g 5   ENULOSE 10 GM/15ML SOLN TAKE 45 MILLILITERS BY MOUTH 3 TIMES A DAY AS NEEDED FOR CONSTIPATION 1892 mL 1   ipratropium-albuterol (DUONEB) 0.5-2.5 (3) MG/3ML SOLN Inhale 3 mLs into the lungs every 6 (six) hours as needed (for SOB, wheeze or cough). 360 mL 2   levocetirizine (XYZAL) 5 MG tablet Take 1 tablet (5 mg total) by mouth every evening. 90 tablet 1   linaclotide (LINZESS) 72 MCG capsule Take 1 capsule (72 mcg total) by mouth daily before breakfast. 90 capsule 1   montelukast (SINGULAIR) 10 MG tablet TAKE 1 TABLET BY MOUTH EVERYDAY AT BEDTIME 90 tablet 1   nortriptyline (PAMELOR) 25 MG capsule Take 2 capsules (50 mg total) by mouth at bedtime. 180 capsule 0   Omega 3 1200 MG CAPS Take 1,200 mg by mouth every morning.      ondansetron (ZOFRAN) 8 MG tablet TAKE 1 TABLET BY MOUTH EVERY 8 HOURS AS NEEDED FOR NAUSEA (Patient taking differently: Take 8 mg by mouth  every 8 (eight) hours as needed for nausea.) 30 tablet 2   OXYGEN-HELIUM IN Inhale 3 L into the lungs See admin instructions. Uses when needed during the day, uses continuous throughout the night     pantoprazole (PROTONIX) 40 MG tablet TAKE 1 TABLET BY MOUTH EVERY DAY 90 tablet 1   Pitavastatin Calcium (LIVALO) 2 MG TABS Take 1 tablet (2 mg total) by mouth daily. 90 tablet 1   potassium chloride SA (KLOR-CON M20) 20 MEQ tablet Take 1 tablet (20 mEq total) by mouth daily. 90 tablet 1   vitamin C  (ASCORBIC ACID) 500 MG tablet Take 500 mg by mouth daily.     fluticasone (FLONASE) 50 MCG/ACT nasal spray USE 2 SPRAYS INTO EACH NOSTRIL ONCE DAILY**REPEAT FOR 5 DAYS THEN STOP (Patient not taking: No sig reported) 16 g 11   vitamin B-12 (CYANOCOBALAMIN) 1000 MCG tablet Take 1 tablet (1,000 mcg total) by mouth daily. (Patient not taking: Reported on 03/17/2021) 90 tablet 1   No current facility-administered medications for this visit.    LABS/IMAGING: No results found for this or any previous visit (from the past 48 hour(s)). No results found.  VITALS: BP 116/66 (BP Location: Left Arm, Patient Position: Sitting, Cuff Size: Normal)   Pulse 82   Ht $R'5\' 2"'jp$  (1.575 m)   Wt 114 lb 12.8 oz (52.1 kg)   SpO2 95%   BMI 21.00 kg/m   EXAM: General appearance: alert, no distress and on oxygen Neck: no carotid bruit, no JVD and thyroid not enlarged, symmetric, no tenderness/mass/nodules Lungs: diminished breath sounds bilaterally Heart: regular rate and rhythm, S1, S2 normal and systolic murmur: early systolic 3/6, crescendo at 2nd right intercostal space Abdomen: soft, non-tender; bowel sounds normal; no masses,  no organomegaly Extremities: extremities normal, atraumatic, no cyanosis or edema Pulses: 2+ and symmetric Skin: Skin color, texture, turgor normal. No rashes or lesions Neurologic: Grossly normal Psych: Pleasant  EKG: Normal sinus rhythm at 80, possible left atrial enlargement -personally reviewed  ASSESSMENT: Severe COPD with moderate pulmonary hypertension, on chronic oxygen (disabled d/t this) Moderate aortic stenosis mean gradient 26.8 mmHg (11/2020) Low risk nuclear stress testing in 2012 Dyslipidemia - on livalo   History of CAD with prior remote PCI  PLAN: 1.   [name] was hospitalized with acute exacerbation of COPD/severe sepsis due to community-acquired pneumonia and chronic diastolic heart failure.  She now has likely moderate aortic stenosis with a mean gradient  of 26.8 mmHg.  We will need to continue to monitor this and plan a repeat echo in about a year.  I advised her to consider increasing her Lasix up to 40 mg twice daily again however she was resistant due to increased urination in the evening and night.  I also think she should follow-up with pulmonary as its been sometime since she seen Dr. Lake Bells.  We will make a referral back to see them.  Follow-up with me in 6 months or sooner as necessary.  Pixie Casino, MD, [facility], [place] Director of the Advanced Lipid Disorders &  Cardiovascular Risk Reduction Clinic Diplomate of the American Board of Clinical Lipidology  Attending Cardiologist  Direct Dial: [phone]  Fax: [phone]  Website:  [email or web address] Barg 03/17/2021, 1:43 PM

## 2021-03-24 ENCOUNTER — Other Ambulatory Visit: Payer: Self-pay | Admitting: Internal Medicine

## 2021-03-24 DIAGNOSIS — J301 Allergic rhinitis due to pollen: Secondary | ICD-10-CM

## 2021-04-09 ENCOUNTER — Encounter: Payer: Medicare Other | Admitting: Internal Medicine

## 2021-04-09 ENCOUNTER — Ambulatory Visit (INDEPENDENT_AMBULATORY_CARE_PROVIDER_SITE_OTHER): Payer: Medicare Other | Admitting: Internal Medicine

## 2021-04-09 ENCOUNTER — Encounter: Payer: Self-pay | Admitting: Internal Medicine

## 2021-04-09 ENCOUNTER — Other Ambulatory Visit: Payer: Self-pay

## 2021-04-09 VITALS — BP 130/72 | HR 88 | Temp 98.3°F | Ht 62.0 in | Wt 118.0 lb

## 2021-04-09 DIAGNOSIS — E782 Mixed hyperlipidemia: Secondary | ICD-10-CM

## 2021-04-09 DIAGNOSIS — I2510 Atherosclerotic heart disease of native coronary artery without angina pectoris: Secondary | ICD-10-CM

## 2021-04-09 DIAGNOSIS — R600 Localized edema: Secondary | ICD-10-CM | POA: Insufficient documentation

## 2021-04-09 DIAGNOSIS — D510 Vitamin B12 deficiency anemia due to intrinsic factor deficiency: Secondary | ICD-10-CM

## 2021-04-09 DIAGNOSIS — I10 Essential (primary) hypertension: Secondary | ICD-10-CM | POA: Diagnosis not present

## 2021-04-09 DIAGNOSIS — N1831 Chronic kidney disease, stage 3a: Secondary | ICD-10-CM | POA: Diagnosis not present

## 2021-04-09 DIAGNOSIS — J449 Chronic obstructive pulmonary disease, unspecified: Secondary | ICD-10-CM

## 2021-04-09 MED ORDER — CYANOCOBALAMIN 1000 MCG/ML IJ SOLN
1000.0000 ug | Freq: Once | INTRAMUSCULAR | Status: AC
  Administered 2021-04-09: 1000 ug via INTRAMUSCULAR

## 2021-04-09 MED ORDER — ATROVENT HFA 17 MCG/ACT IN AERS: INHALATION_SPRAY | RESPIRATORY_TRACT | 3 refills | Status: DC

## 2021-04-09 NOTE — Patient Instructions (Signed)
Chronic Obstructive Pulmonary Disease °Chronic obstructive pulmonary disease (COPD) is a long-term (chronic) condition that affects the lungs. COPD is a general term that can be used to describe many different lung problems that cause lung inflammation and limit airflow, including chronic bronchitis and emphysema. °If you have COPD, your lung function will probably never return to normal. In most cases, it gets worse over time. However, there are steps you can take to slow the progression of the disease and improve your quality of life. °What are the causes? °This condition may be caused by: °Smoking. This is the most common cause. °Certain genes passed down through families. °What increases the risk? °The following factors may make you more likely to develop this condition: °Being exposed to secondhand smoke from cigarettes, pipes, or cigars. °Being exposed to chemicals and other irritants, such as fumes and dust in the work environment. °Having chronic lung conditions or infections. °What are the signs or symptoms? °Symptoms of this condition include: °Shortness of breath, especially during physical activity. °Chronic cough with a large amount of thick mucus. Sometimes, the cough may not have any mucus (dry cough). °Wheezing and rapid breathing. °Gray or bluish discoloration (cyanosis) of the skin, especially in the fingers, toes, or lips. °Feeling tired (fatigue). °Weight loss. °Chest tightness. °Frequent infections. °Episodes when breathing symptoms become much worse (exacerbations). °At the later stages of this disease, you may have swelling in the ankles, feet, or legs. °How is this diagnosed? °This condition is diagnosed based on: °Your medical history. °A physical exam. °You may also have tests, including: °Lung (pulmonary) function tests. This may include a spirometry test, which measures your ability to exhale properly. °Chest X-ray. °CT scan. °Blood tests. °How is this treated? °This condition may be  treated with: °Medicines. These may include inhaled rescue medicines to treat acute exacerbations as well as medicines that you take long-term (maintenance medicines) to prevent flare-ups of COPD. °Bronchodilators help treat COPD by dilating the airways to allow increased airflow and make your breathing more comfortable. °Steroids can reduce airway inflammation and help prevent exacerbations. °Smoking cessation. If you smoke, your health care provider may ask you to quit, and may also recommend therapy or replacement products to help you quit. °Pulmonary rehabilitation. This may involve working with a team of health care providers and specialists, such as respiratory, occupational, and physical therapists. °Exercise and physical activity. These are beneficial for nearly all people with COPD. °Nutrition therapy to gain weight, if you are underweight. °Oxygen. Supplemental oxygen therapy is only helpful if you have a low oxygen level in your blood (hypoxemia). °Lung surgery or transplant. °Palliative care. This is to help people with COPD feel comfortable when treatment is no longer working. °Follow these instructions at home: °Medicines °Take over-the-counter and prescription medicines only as told by your health care provider. This includes inhaled medicines and pills. °Talk to your health care provider before taking any cough or allergy medicines. You may need to avoid certain medicines that dry out your airways. °Lifestyle °If you smoke, the most important thing that you can do is to stop smoking. Continuing to smoke will cause the disease to progress faster. °Do not use any products that contain nicotine or tobacco. These products include cigarettes, chewing tobacco, and vaping devices, such as e-cigarettes. If you need help quitting, ask your health care provider. °Avoid exposure to things that irritate your lungs, such as smoke, chemicals, and fumes. °Stay active, but balance activity with periods of rest.  Exercise and physical   activity will help you maintain your ability to do things you want to do. °Learn and use relaxation techniques to manage stress and to control your breathing. °Get the right amount of sleep and get quality sleep. Most adults need 7 or more hours per night. °Eat healthy foods. Eating smaller, more frequent meals and resting before meals may help you maintain your strength. °Controlled breathing °Learn and use controlled breathing techniques as directed by your health care provider. Controlled breathing techniques include: °Pursed lip breathing. Start by breathing in (inhaling) through your nose for 1 second. Then, purse your lips as if you were going to whistle and breathe out (exhale) through the pursed lips for 2 seconds. °Diaphragmatic breathing. Start by putting one hand on your abdomen just above your waist. Inhale slowly through your nose. The hand on your abdomen should move out. Then purse your lips and exhale slowly. You should be able to feel the hand on your abdomen moving in as you exhale. ° °Controlled coughing °Learn and use controlled coughing to clear mucus from your lungs. Controlled coughing is a series of short, progressive coughs. The steps of controlled coughing are: °Lean your head slightly forward. °Breathe in deeply using diaphragmatic breathing. °Try to hold your breath for 3 seconds. °Keep your mouth slightly open while coughing twice. °Spit any mucus out into a tissue. °Rest and repeat the steps once or twice as needed. °General instructions °Make sure you receive all the vaccines that your health care provider recommends, especially the pneumococcal and influenza vaccines. Preventing infection and hospitalization is very important when you have COPD. °Drink enough fluid to keep your urine pale yellow, unless you have a medical condition that requires fluid restriction. °Use oxygen therapy and pulmonary rehabilitation if told by your health care provider. If you  require home oxygen therapy, ask your health care provider whether you should purchase a pulse oximeter to measure your oxygen level at home. °Work with your health care provider to develop a COPD action plan. This will help you know what steps to take if your condition gets worse. °Keep other chronic health conditions under control as told by your health care provider. °Avoid extreme temperature and humidity changes. °Avoid contact with people who have an illness that spreads from person to person (is contagious), such as viral infections or pneumonia. °Keep all follow-up visits. This is important. °Contact a health care provider if: °You are coughing up more mucus than usual. °There is a change in the color or thickness of your mucus. °Your breathing is more labored than usual. °Your breathing is faster than usual. °You have difficulty sleeping. °You need to use your rescue medicines or inhalers more often than expected. °You have trouble doing routine activities such as getting dressed or walking around the house. °Get help right away if: °You have shortness of breath while you are resting. °You have shortness of breath that prevents you from: °Being able to talk. °Performing your usual physical activities. °You have chest pain lasting longer than 5 minutes. °Your skin color is more blue (cyanotic) than usual. °You measure low oxygen saturations for longer than 5 minutes with a pulse oximeter. °You have a fever. °You feel too tired to breathe normally. °These symptoms may represent a serious problem that is an emergency. Do not wait to see if the symptoms will go away. Get medical help right away. Call your local emergency services (911 in the [place] Do not drive yourself to the hospital. °Summary °Chronic obstructive pulmonary   disease (COPD) is a long-term (chronic) condition that affects the lungs. °Your lung function will probably never return to normal. In most cases, it gets worse over time. However, there  are steps you can take to slow the progression of the disease and improve your quality of life. °Treatment for COPD may include taking medicines, quitting smoking, pulmonary rehabilitation, and changes to diet and exercise. As the disease progresses, you may need oxygen therapy, a lung transplant, or palliative care. °To help manage your condition, do not smoke, avoid exposure to things that irritate your lungs, stay up to date on all vaccines, and follow your health care provider's instructions for taking medicines. °This information is not intended to replace advice given to you by your health care provider. Make sure you discuss any questions you have with your health care provider. °Document Revised: 07/22/2020 Document Reviewed: 07/22/2020 °Elsevier Patient Education © 2022 [employer] ° °

## 2021-04-09 NOTE — Progress Notes (Signed)
Right arm edema   Subjective:  Patient ID: [name], female    DOB: 1946/07/26  Age: 75 y.o. MRN: [id number]  CC: Hypertension and COPD  This visit occurred during the SARS-CoV-2 public health emergency.  Safety protocols were in place, including screening questions prior to the visit, additional usage of staff PPE, and extensive cleaning of exam room while observing appropriate contact time as indicated for disinfecting solutions.    HPI [name] presents for f/up -  She complains of a 17-month history of painless edema in her right upper extremity.  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  She has a stable degree of cough productive of clear phlegm with shortness of breath and wheezing.  Outpatient Medications Prior to Visit  Medication Sig Dispense Refill   albuterol (VENTOLIN HFA) 108 (90 Base) MCG/ACT inhaler Inhale 1-2 puffs into the lungs every 6 (six) hours as needed for wheezing or shortness of breath. 36 each 2   ALPRAZolam (XANAX) 1 MG tablet TAKE 1 TABLET BY MOUTH THREE TIMES A DAY AS NEEDED 90 tablet 3   budesonide-formoterol (SYMBICORT) 160-4.5 MCG/ACT inhaler Inhale 2 puffs into the lungs in the morning and at bedtime. 30.6 each 1   butalbital-acetaminophen-caffeine (FIORICET) 50-325-40 MG tablet Take 1 tablet by mouth every 4 (four) hours as needed for headache. 65 tablet 3   cholecalciferol (VITAMIN D) 1000 units tablet Take 1,000 Units by mouth daily.     denosumab ([name]) 60 MG/ML SOSY injection Inject 60 mg into the skin every 6 (six) months. 1 mL 1   diclofenac sodium (VOLTAREN) 1 % GEL Apply 4 g topically as needed (for pain). 100 g 5   ENULOSE 10 GM/15ML SOLN TAKE 45 MILLILITERS BY MOUTH 3 TIMES A DAY AS NEEDED FOR CONSTIPATION 1892 mL 1   fluticasone (FLONASE) 50 MCG/ACT nasal spray USE 2 SPRAYS INTO EACH NOSTRIL ONCE DAILY**REPEAT FOR 5 DAYS THEN STOP 16 g 11   furosemide (LASIX) 40 MG tablet TAKE 1 TABLET BY MOUTH TWICE A DAY 180  tablet 1   ipratropium-albuterol (DUONEB) 0.5-2.5 (3) MG/3ML SOLN Inhale 3 mLs into the lungs every 6 (six) hours as needed (for SOB, wheeze or cough). 360 mL 2   levocetirizine (XYZAL) 5 MG tablet TAKE 1 TABLET BY MOUTH EVERY DAY IN THE EVENING 90 tablet 1   linaclotide (LINZESS) 72 MCG capsule Take 1 capsule (72 mcg total) by mouth daily before breakfast. 90 capsule 1   montelukast (SINGULAIR) 10 MG tablet TAKE 1 TABLET BY MOUTH EVERYDAY AT BEDTIME 90 tablet 1   nortriptyline (PAMELOR) 25 MG capsule Take 2 capsules (50 mg total) by mouth at bedtime. 180 capsule 0   Omega 3 1200 MG CAPS Take 1,200 mg by mouth every morning.      OXYGEN-HELIUM IN Inhale 3 L into the lungs See admin instructions. Uses when needed during the day, uses continuous throughout the night     pantoprazole (PROTONIX) 40 MG tablet TAKE 1 TABLET BY MOUTH EVERY DAY 90 tablet 1   Pitavastatin Calcium (LIVALO) 2 MG TABS Take 1 tablet (2 mg total) by mouth daily. 90 tablet 1   potassium chloride SA (KLOR-CON M20) 20 MEQ tablet Take 1 tablet (20 mEq total) by mouth daily. 90 tablet 1   vitamin B-12 (CYANOCOBALAMIN) 1000 MCG tablet Take 1 tablet (1,000 mcg total) by mouth daily. 90 tablet 1   vitamin C (ASCORBIC ACID) 500 MG tablet Take 500 mg by mouth daily.  ondansetron (ZOFRAN) 8 MG tablet TAKE 1 TABLET BY MOUTH EVERY 8 HOURS AS NEEDED FOR NAUSEA (Patient taking differently: Take 8 mg by mouth every 8 (eight) hours as needed for nausea.) 30 tablet 2   No facility-administered medications prior to visit.    ROS Review of Systems  Constitutional:  Negative for chills, diaphoresis, fatigue and fever.  HENT: Negative.    Eyes: Negative.   Respiratory:  Positive for cough, shortness of breath and wheezing. Negative for chest tightness and stridor.   Cardiovascular:  Negative for chest pain, palpitations and leg swelling.  Gastrointestinal:  Negative for abdominal pain, constipation, diarrhea and vomiting.  Endocrine:  Negative.   Genitourinary: Negative.  Negative for difficulty urinating.  Musculoskeletal: Negative.   Skin: Negative.  Negative for color change.  Neurological: Negative.   Hematological:  Negative for adenopathy. Does not bruise/bleed easily.  Psychiatric/Behavioral: Negative.     Objective:  BP 130/72 (BP Location: Left Arm, Patient Position: Sitting, Cuff Size: Normal)   Pulse 88   Temp 98.3 F (36.8 C) (Oral)   Ht 5\' 2"  (1.575 m)   Wt 118 lb (53.5 kg)   SpO2 90%   BMI 21.58 kg/m   BP Readings from Last 3 Encounters:  04/09/21 130/72  03/17/21 116/66  01/15/21 108/74    Wt Readings from Last 3 Encounters:  04/09/21 118 lb (53.5 kg)  03/17/21 114 lb 12.8 oz (52.1 kg)  01/15/21 112 lb (50.8 kg)    Physical Exam Vitals reviewed.  Constitutional:      General: She is not in acute distress.    Appearance: Normal appearance. She is ill-appearing (on O2). She is not toxic-appearing or diaphoretic.  HENT:     Nose: Nose normal.     Mouth/Throat:     Mouth: Mucous membranes are moist.  Eyes:     Conjunctiva/sclera: Conjunctivae normal.  Cardiovascular:     Rate and Rhythm: Normal rate and regular rhythm.     Heart sounds: Murmur heard.  Systolic murmur is present with a grade of 3/6.    No gallop.  Pulmonary:     Effort: Tachypnea and accessory muscle usage present.     Breath sounds: Examination of the right-middle field reveals wheezing. Examination of the left-middle field reveals wheezing. Examination of the right-lower field reveals wheezing. Examination of the left-lower field reveals wheezing. Wheezing present. No decreased breath sounds, rhonchi or rales.  Abdominal:     General: Abdomen is flat.     Palpations: There is no mass.     Tenderness: There is no abdominal tenderness. There is no guarding.     Hernia: No hernia is present.  Musculoskeletal:     Cervical back: Neck supple.     Right lower leg: No edema.     Left lower leg: No edema.      Comments: RUE - diffuse 2-3+ pitting edema Skin is normal  Lymphadenopathy:     Cervical: No cervical adenopathy.  Skin:    General: Skin is warm and dry.     Findings: No lesion or rash.  Neurological:     General: No focal deficit present.     Mental Status: She is alert.  Psychiatric:        Mood and Affect: Mood normal.        Behavior: Behavior normal.    Lab Results  Component Value Date   WBC 5.6 01/15/2021   HGB 12.9 01/15/2021   HCT 38.9 01/15/2021   PLT  216.0 01/15/2021   GLUCOSE 134 (H) 12/25/2020   CHOL 214 (H) 05/06/2020   TRIG 114 05/06/2020   HDL 76 05/06/2020   LDLCALC 116 (H) 05/06/2020   ALT 14 12/23/2020   AST 27 12/23/2020   NA 138 12/25/2020   K 3.7 12/25/2020   CL 99 12/25/2020   CREATININE 0.74 12/25/2020   BUN 12 12/25/2020   CO2 32 12/25/2020   TSH 1.76 05/06/2020   INR 1.0 12/23/2020   HGBA1C 5.1 01/15/2021    DG Chest Port 1 View  Result Date: 12/23/2020 CLINICAL DATA:  Shortness of breath and weakness EXAM: PORTABLE CHEST 1 VIEW COMPARISON:  04/22/2020 FINDINGS: Cardiac shadow is stable. Aortic calcifications are noted. Left lung is well aerated and clear. Right lung demonstrates patchy basilar infiltrate new from the prior exam. Postsurgical changes in the right axilla are noted. IMPRESSION: Right basilar infiltrate. Electronically Signed   By: Inez Catalina M.D.   On: 12/23/2020 01:23    Assessment & Plan:   [name] was seen today for hypertension and copd.  Diagnoses and all orders for this visit:  Primary hypertension- Her blood pressure is adequately well controlled. -     Basic metabolic panel; Future -     TSH; Future  Stage 3a chronic kidney disease ([name])- Her renal function is stable. -     Basic metabolic panel; Future  Mixed hyperlipidemia -     TSH; Future -     Lipid panel; Future  Vitamin B12 deficiency anemia due to intrinsic factor deficiency -     cyanocobalamin ((VITAMIN B-12)) injection 1,000 mcg  Edema of  right upper arm Will check for DVT. -     DOPPLER VENOUS ARMS BILATERAL; Future -     D-dimer, quantitative; Future  Chronic obstructive pulmonary disease, unspecified COPD type ([name]) -     ipratropium (ATROVENT HFA) 17 MCG/ACT inhaler; USE 2 PUFFS EVERY FOUR HOURS AS NEEDED  I have discontinued [name] "[name]"'s ondansetron. I have also changed her Atrovent HFA. Additionally, I am having her maintain her OXYGEN-HELIUM IN, Omega 3, cholecalciferol, vitamin C, fluticasone, diclofenac sodium, montelukast, [name], pantoprazole, vitamin B-12, budesonide-formoterol, ipratropium-albuterol, nortriptyline, albuterol, Enulose, butalbital-acetaminophen-caffeine, linaclotide, Livalo, potassium chloride SA, ALPRAZolam, furosemide, and levocetirizine. We administered cyanocobalamin.  Meds ordered this encounter  Medications   cyanocobalamin ((VITAMIN B-12)) injection 1,000 mcg   ipratropium (ATROVENT HFA) 17 MCG/ACT inhaler    Sig: USE 2 PUFFS EVERY FOUR HOURS AS NEEDED    Dispense:  77.4 each    Refill:  3      Follow-up: Return in about 3 months (around 07/10/2021).  Scarlette Calico, MD

## 2021-04-13 ENCOUNTER — Inpatient Hospital Stay: Admission: RE | Admit: 2021-04-13 | Payer: Medicare Other | Source: Ambulatory Visit

## 2021-04-15 ENCOUNTER — Other Ambulatory Visit: Payer: Self-pay

## 2021-04-15 ENCOUNTER — Other Ambulatory Visit (INDEPENDENT_AMBULATORY_CARE_PROVIDER_SITE_OTHER): Payer: Medicare Other

## 2021-04-15 DIAGNOSIS — N1831 Chronic kidney disease, stage 3a: Secondary | ICD-10-CM | POA: Diagnosis not present

## 2021-04-15 DIAGNOSIS — E782 Mixed hyperlipidemia: Secondary | ICD-10-CM | POA: Diagnosis not present

## 2021-04-15 DIAGNOSIS — R600 Localized edema: Secondary | ICD-10-CM

## 2021-04-15 DIAGNOSIS — I10 Essential (primary) hypertension: Secondary | ICD-10-CM

## 2021-04-15 LAB — D-DIMER, QUANTITATIVE: D-Dimer, Quant: 0.51 mcg/mL FEU — ABNORMAL HIGH (ref <0.50)

## 2021-04-15 LAB — LIPID PANEL
Cholesterol: 187 mg/dL (ref 0–200)
HDL: 66.8 mg/dL (ref >39.00)
LDL Cholesterol: 93 mg/dL (ref 0–99)
NonHDL: 120.51
Total CHOL/HDL Ratio: 3
Triglycerides: 136 mg/dL (ref 0.0–149.0)
VLDL: 27.2 mg/dL (ref 0.0–40.0)

## 2021-04-15 LAB — BASIC METABOLIC PANEL
BUN: 14 mg/dL (ref 6–23)
CO2: 35 mEq/L — ABNORMAL HIGH (ref 19–32)
Calcium: 10.7 mg/dL — ABNORMAL HIGH (ref 8.4–10.5)
Chloride: 94 mEq/L — ABNORMAL LOW (ref 96–112)
Creatinine, Ser: 1.05 mg/dL (ref 0.40–1.20)
GFR: 52.05 mL/min — ABNORMAL LOW (ref >60.00)
Glucose, Bld: 103 mg/dL — ABNORMAL HIGH (ref 70–99)
Potassium: 4.2 mEq/L (ref 3.5–5.1)
Sodium: 137 mEq/L (ref 135–145)

## 2021-04-15 LAB — TSH: TSH: 2.01 u[IU]/mL (ref 0.35–5.50)

## 2021-04-18 ENCOUNTER — Other Ambulatory Visit: Payer: Self-pay

## 2021-04-22 ENCOUNTER — Institutional Professional Consult (permissible substitution): Payer: Medicare Other | Admitting: Pulmonary Disease

## 2021-04-29 ENCOUNTER — Ambulatory Visit
Admission: RE | Admit: 2021-04-29 | Discharge: 2021-04-29 | Disposition: A | Discharge status: Home / Self Care | Payer: Medicare Other | Source: Ambulatory Visit | Attending: Internal Medicine | Admitting: Internal Medicine

## 2021-04-29 ENCOUNTER — Other Ambulatory Visit: Payer: Self-pay

## 2021-04-29 DIAGNOSIS — Z1231 Encounter for screening mammogram for malignant neoplasm of breast: Secondary | ICD-10-CM

## 2021-05-01 ENCOUNTER — Telehealth: Payer: Self-pay | Admitting: Internal Medicine

## 2021-05-05 NOTE — Telephone Encounter (Signed)
[name] arrived in office today.  Left message with [name] who states pt will call back to schedule [name] injection; advised that injection appt can be 8/15 or after.

## 2021-05-12 ENCOUNTER — Ambulatory Visit (INDEPENDENT_AMBULATORY_CARE_PROVIDER_SITE_OTHER): Payer: Medicare Other

## 2021-05-12 ENCOUNTER — Other Ambulatory Visit: Payer: Self-pay

## 2021-05-12 DIAGNOSIS — M810 Age-related osteoporosis without current pathological fracture: Secondary | ICD-10-CM

## 2021-05-12 MED ORDER — DENOSUMAB 60 MG/ML ~~LOC~~ SOSY
60.0000 mg | PREFILLED_SYRINGE | Freq: Once | SUBCUTANEOUS | Status: AC
  Administered 2021-05-12: 60 mg via SUBCUTANEOUS

## 2021-05-12 NOTE — Progress Notes (Signed)
Pt here for [name] injection per Dr Tula Nakayama '60mg'$  given subcutaneous in right arm and pt tolerated injection well.  Pt states she will schedule next [name] when she is notified that pharmacy has shipped medication to office.

## 2021-05-13 ENCOUNTER — Ambulatory Visit: Payer: Medicare Other

## 2021-05-18 ENCOUNTER — Other Ambulatory Visit: Payer: Self-pay | Admitting: Internal Medicine

## 2021-05-18 DIAGNOSIS — J449 Chronic obstructive pulmonary disease, unspecified: Secondary | ICD-10-CM

## 2021-05-26 ENCOUNTER — Institutional Professional Consult (permissible substitution): Payer: Medicare Other | Admitting: Pulmonary Disease

## 2021-06-04 ENCOUNTER — Other Ambulatory Visit: Payer: Self-pay | Admitting: Internal Medicine

## 2021-06-04 DIAGNOSIS — J301 Allergic rhinitis due to pollen: Secondary | ICD-10-CM

## 2021-06-15 ENCOUNTER — Encounter: Payer: Self-pay | Admitting: Internal Medicine

## 2021-06-18 ENCOUNTER — Encounter (HOSPITAL_COMMUNITY): Payer: Self-pay | Admitting: Internal Medicine

## 2021-06-18 ENCOUNTER — Other Ambulatory Visit: Payer: Self-pay

## 2021-06-18 ENCOUNTER — Emergency Department (HOSPITAL_COMMUNITY): Payer: Medicare Other

## 2021-06-18 ENCOUNTER — Inpatient Hospital Stay (HOSPITAL_COMMUNITY)
Admission: EM | Admit: 2021-06-18 | Discharge: 2021-06-22 | DRG: 189 | Disposition: A | Discharge status: Home / Self Care | Payer: Medicare Other | Attending: Student | Admitting: Student

## 2021-06-18 DIAGNOSIS — Z716 Tobacco abuse counseling: Secondary | ICD-10-CM | POA: Diagnosis not present

## 2021-06-18 DIAGNOSIS — I890 Lymphedema, not elsewhere classified: Secondary | ICD-10-CM | POA: Diagnosis present

## 2021-06-18 DIAGNOSIS — Z20822 Contact with and (suspected) exposure to covid-19: Secondary | ICD-10-CM | POA: Diagnosis present

## 2021-06-18 DIAGNOSIS — Z888 Allergy status to other drugs, medicaments and biological substances status: Secondary | ICD-10-CM

## 2021-06-18 DIAGNOSIS — I110 Hypertensive heart disease with heart failure: Secondary | ICD-10-CM | POA: Diagnosis present

## 2021-06-18 DIAGNOSIS — R5381 Other malaise: Secondary | ICD-10-CM | POA: Diagnosis not present

## 2021-06-18 DIAGNOSIS — E872 Acidosis: Secondary | ICD-10-CM | POA: Diagnosis present

## 2021-06-18 DIAGNOSIS — R6889 Other general symptoms and signs: Secondary | ICD-10-CM | POA: Diagnosis not present

## 2021-06-18 DIAGNOSIS — F1390 Sedative, hypnotic, or anxiolytic use, unspecified, uncomplicated: Secondary | ICD-10-CM | POA: Diagnosis present

## 2021-06-18 DIAGNOSIS — I272 Pulmonary hypertension, unspecified: Secondary | ICD-10-CM | POA: Diagnosis present

## 2021-06-18 DIAGNOSIS — Z9981 Dependence on supplemental oxygen: Secondary | ICD-10-CM | POA: Diagnosis not present

## 2021-06-18 DIAGNOSIS — Z9119 Patient's noncompliance with other medical treatment and regimen: Secondary | ICD-10-CM

## 2021-06-18 DIAGNOSIS — F17200 Nicotine dependence, unspecified, uncomplicated: Secondary | ICD-10-CM | POA: Diagnosis not present

## 2021-06-18 DIAGNOSIS — Z818 Family history of other mental and behavioral disorders: Secondary | ICD-10-CM

## 2021-06-18 DIAGNOSIS — Z8249 Family history of ischemic heart disease and other diseases of the circulatory system: Secondary | ICD-10-CM

## 2021-06-18 DIAGNOSIS — J9621 Acute and chronic respiratory failure with hypoxia: Principal | ICD-10-CM | POA: Diagnosis present

## 2021-06-18 DIAGNOSIS — E782 Mixed hyperlipidemia: Secondary | ICD-10-CM | POA: Diagnosis present

## 2021-06-18 DIAGNOSIS — Z853 Personal history of malignant neoplasm of breast: Secondary | ICD-10-CM

## 2021-06-18 DIAGNOSIS — F17210 Nicotine dependence, cigarettes, uncomplicated: Secondary | ICD-10-CM | POA: Diagnosis present

## 2021-06-18 DIAGNOSIS — Z9842 Cataract extraction status, left eye: Secondary | ICD-10-CM

## 2021-06-18 DIAGNOSIS — Z9841 Cataract extraction status, right eye: Secondary | ICD-10-CM

## 2021-06-18 DIAGNOSIS — Z83438 Family history of other disorder of lipoprotein metabolism and other lipidemia: Secondary | ICD-10-CM

## 2021-06-18 DIAGNOSIS — J9622 Acute and chronic respiratory failure with hypercapnia: Secondary | ICD-10-CM | POA: Diagnosis present

## 2021-06-18 DIAGNOSIS — E538 Deficiency of other specified B group vitamins: Secondary | ICD-10-CM | POA: Diagnosis present

## 2021-06-18 DIAGNOSIS — F40240 Claustrophobia: Secondary | ICD-10-CM | POA: Diagnosis present

## 2021-06-18 DIAGNOSIS — F419 Anxiety disorder, unspecified: Secondary | ICD-10-CM | POA: Diagnosis not present

## 2021-06-18 DIAGNOSIS — Z955 Presence of coronary angioplasty implant and graft: Secondary | ICD-10-CM

## 2021-06-18 DIAGNOSIS — I5032 Chronic diastolic (congestive) heart failure: Secondary | ICD-10-CM | POA: Diagnosis not present

## 2021-06-18 DIAGNOSIS — J439 Emphysema, unspecified: Secondary | ICD-10-CM | POA: Diagnosis present

## 2021-06-18 DIAGNOSIS — Z833 Family history of diabetes mellitus: Secondary | ICD-10-CM

## 2021-06-18 DIAGNOSIS — R0902 Hypoxemia: Secondary | ICD-10-CM

## 2021-06-18 DIAGNOSIS — Z961 Presence of intraocular lens: Secondary | ICD-10-CM | POA: Diagnosis present

## 2021-06-18 DIAGNOSIS — Z9049 Acquired absence of other specified parts of digestive tract: Secondary | ICD-10-CM | POA: Diagnosis not present

## 2021-06-18 DIAGNOSIS — I5033 Acute on chronic diastolic (congestive) heart failure: Secondary | ICD-10-CM | POA: Diagnosis present

## 2021-06-18 DIAGNOSIS — Z881 Allergy status to other antibiotic agents status: Secondary | ICD-10-CM

## 2021-06-18 DIAGNOSIS — I252 Old myocardial infarction: Secondary | ICD-10-CM

## 2021-06-18 DIAGNOSIS — K219 Gastro-esophageal reflux disease without esophagitis: Secondary | ICD-10-CM | POA: Diagnosis present

## 2021-06-18 DIAGNOSIS — Z820 Family history of epilepsy and other diseases of the nervous system: Secondary | ICD-10-CM

## 2021-06-18 DIAGNOSIS — Z803 Family history of malignant neoplasm of breast: Secondary | ICD-10-CM

## 2021-06-18 DIAGNOSIS — J9602 Acute respiratory failure with hypercapnia: Secondary | ICD-10-CM

## 2021-06-18 DIAGNOSIS — J441 Chronic obstructive pulmonary disease with (acute) exacerbation: Secondary | ICD-10-CM | POA: Diagnosis present

## 2021-06-18 DIAGNOSIS — I2510 Atherosclerotic heart disease of native coronary artery without angina pectoris: Secondary | ICD-10-CM | POA: Diagnosis present

## 2021-06-18 LAB — BASIC METABOLIC PANEL
Anion gap: 9 (ref 5–15)
BUN: 11 mg/dL (ref 8–23)
CO2: 28 mmol/L (ref 22–32)
Calcium: 8.9 mg/dL (ref 8.9–10.3)
Chloride: 101 mmol/L (ref 98–111)
Creatinine, Ser: 0.86 mg/dL (ref 0.44–1.00)
GFR, Estimated: >60 mL/min (ref >60)
Glucose, Bld: 108 mg/dL — ABNORMAL HIGH (ref 70–99)
Potassium: 4.7 mmol/L (ref 3.5–5.1)
Sodium: 138 mmol/L (ref 135–145)

## 2021-06-18 LAB — BRAIN NATRIURETIC PEPTIDE: B Natriuretic Peptide: 100.5 pg/mL — ABNORMAL HIGH (ref 0.0–100.0)

## 2021-06-18 LAB — CBC
HCT: 43.8 % (ref 36.0–46.0)
Hemoglobin: 13.4 g/dL (ref 12.0–15.0)
MCH: 32.2 pg (ref 26.0–34.0)
MCHC: 30.6 g/dL (ref 30.0–36.0)
MCV: 105.3 fL — ABNORMAL HIGH (ref 80.0–100.0)
Platelets: 190 10*3/uL (ref 150–400)
RBC: 4.16 MIL/uL (ref 3.87–5.11)
RDW: 13.2 % (ref 11.5–15.5)
WBC: 8.7 10*3/uL (ref 4.0–10.5)
nRBC: 0 % (ref 0.0–0.2)

## 2021-06-18 LAB — I-STAT ARTERIAL BLOOD GAS, ED
Acid-Base Excess: 9 mmol/L — ABNORMAL HIGH (ref 0.0–2.0)
Bicarbonate: 39.8 mmol/L — ABNORMAL HIGH (ref 20.0–28.0)
Calcium, Ion: 1.25 mmol/L (ref 1.15–1.40)
HCT: 40 % (ref 36.0–46.0)
Hemoglobin: 13.6 g/dL (ref 12.0–15.0)
O2 Saturation: 79 %
Potassium: 4.4 mmol/L (ref 3.5–5.1)
Sodium: 140 mmol/L (ref 135–145)
TCO2: 42 mmol/L — ABNORMAL HIGH (ref 22–32)
pCO2 arterial: 89.9 mmHg (ref 32.0–48.0)
pH, Arterial: 7.254 — ABNORMAL LOW (ref 7.350–7.450)
pO2, Arterial: 54 mmHg — ABNORMAL LOW (ref 83.0–108.0)

## 2021-06-18 LAB — RESP PANEL BY RT-PCR (FLU A&B, COVID) ARPGX2
Influenza A by PCR: NEGATIVE
Influenza B by PCR: NEGATIVE
SARS Coronavirus 2 by RT PCR: NEGATIVE

## 2021-06-18 MED ORDER — ENOXAPARIN SODIUM 40 MG/0.4ML IJ SOSY
40.0000 mg | PREFILLED_SYRINGE | INTRAMUSCULAR | Status: DC
  Administered 2021-06-18 – 2021-06-22 (×5): 40 mg via SUBCUTANEOUS
  Filled 2021-06-18 (×5): qty 0.4

## 2021-06-18 MED ORDER — ALPRAZOLAM 0.5 MG PO TABS
0.5000 mg | ORAL_TABLET | Freq: Three times a day (TID) | ORAL | Status: DC | PRN
  Administered 2021-06-18: 0.5 mg via ORAL
  Filled 2021-06-18: qty 1

## 2021-06-18 MED ORDER — FLUTICASONE PROPIONATE 50 MCG/ACT NA SUSP
1.0000 | Freq: Every day | NASAL | Status: DC
  Administered 2021-06-21: 1 via NASAL
  Filled 2021-06-18: qty 16

## 2021-06-18 MED ORDER — PANTOPRAZOLE SODIUM 40 MG PO TBEC
40.0000 mg | DELAYED_RELEASE_TABLET | Freq: Every day | ORAL | Status: DC
  Administered 2021-06-18 – 2021-06-22 (×5): 40 mg via ORAL
  Filled 2021-06-18 (×5): qty 1

## 2021-06-18 MED ORDER — MONTELUKAST SODIUM 10 MG PO TABS
10.0000 mg | ORAL_TABLET | Freq: Every day | ORAL | Status: DC
  Administered 2021-06-18 – 2021-06-21 (×4): 10 mg via ORAL
  Filled 2021-06-18 (×4): qty 1

## 2021-06-18 MED ORDER — PREDNISONE 20 MG PO TABS
40.0000 mg | ORAL_TABLET | Freq: Every day | ORAL | Status: DC
  Administered 2021-06-20 – 2021-06-22 (×3): 40 mg via ORAL
  Filled 2021-06-18 (×3): qty 2

## 2021-06-18 MED ORDER — SODIUM CHLORIDE 0.9% FLUSH
10.0000 mL | Freq: Two times a day (BID) | INTRAVENOUS | Status: DC
Start: 2021-06-18 — End: 2021-06-22
  Administered 2021-06-18 – 2021-06-21 (×5): 10 mL

## 2021-06-18 MED ORDER — SODIUM CHLORIDE 0.9% FLUSH: 10.0000 mL | INTRAVENOUS | Status: DC | PRN

## 2021-06-18 MED ORDER — ACETAMINOPHEN 650 MG RE SUPP: 650.0000 mg | Freq: Four times a day (QID) | RECTAL | Status: DC | PRN

## 2021-06-18 MED ORDER — LORAZEPAM 2 MG/ML IJ SOLN: 0.5000 mg | Freq: Four times a day (QID) | INTRAMUSCULAR | Status: DC | PRN

## 2021-06-18 MED ORDER — DICLOFENAC SODIUM 1 % TD GEL: 4.0000 g | TRANSDERMAL | Status: DC | PRN

## 2021-06-18 MED ORDER — LINACLOTIDE 72 MCG PO CAPS
72.0000 ug | ORAL_CAPSULE | Freq: Every day | ORAL | Status: DC
  Administered 2021-06-19 – 2021-06-22 (×4): 72 ug via ORAL
  Filled 2021-06-18 (×5): qty 1

## 2021-06-18 MED ORDER — ALBUTEROL SULFATE (2.5 MG/3ML) 0.083% IN NEBU: 2.5000 mg | INHALATION_SOLUTION | RESPIRATORY_TRACT | Status: DC | PRN

## 2021-06-18 MED ORDER — FUROSEMIDE 10 MG/ML IJ SOLN
40.0000 mg | Freq: Two times a day (BID) | INTRAMUSCULAR | Status: DC
  Administered 2021-06-19 – 2021-06-20 (×3): 40 mg via INTRAVENOUS
  Filled 2021-06-18 (×4): qty 4

## 2021-06-18 MED ORDER — FUROSEMIDE 40 MG PO TABS: 40.0000 mg | ORAL_TABLET | Freq: Two times a day (BID) | ORAL | Status: DC

## 2021-06-18 MED ORDER — VITAMIN B-12 1000 MCG PO TABS
1000.0000 ug | ORAL_TABLET | Freq: Every day | ORAL | Status: DC
  Administered 2021-06-18 – 2021-06-22 (×5): 1000 ug via ORAL
  Filled 2021-06-18 (×5): qty 1

## 2021-06-18 MED ORDER — SODIUM CHLORIDE 0.9% FLUSH
3.0000 mL | Freq: Two times a day (BID) | INTRAVENOUS | Status: DC
  Administered 2021-06-18 – 2021-06-22 (×8): 3 mL via INTRAVENOUS

## 2021-06-18 MED ORDER — NORTRIPTYLINE HCL 25 MG PO CAPS
50.0000 mg | ORAL_CAPSULE | Freq: Every day | ORAL | Status: DC
  Administered 2021-06-18 – 2021-06-21 (×4): 50 mg via ORAL
  Filled 2021-06-18 (×6): qty 2

## 2021-06-18 MED ORDER — METHYLPREDNISOLONE SODIUM SUCC 40 MG IJ SOLR
40.0000 mg | Freq: Two times a day (BID) | INTRAMUSCULAR | Status: AC
  Administered 2021-06-18 – 2021-06-19 (×3): 40 mg via INTRAVENOUS
  Filled 2021-06-18 (×3): qty 1

## 2021-06-18 MED ORDER — PRAVASTATIN SODIUM 40 MG PO TABS
40.0000 mg | ORAL_TABLET | Freq: Every day | ORAL | Status: DC
  Administered 2021-06-18 – 2021-06-21 (×4): 40 mg via ORAL
  Filled 2021-06-18 (×4): qty 1

## 2021-06-18 MED ORDER — BUDESONIDE 0.5 MG/2ML IN SUSP
0.5000 mg | Freq: Two times a day (BID) | RESPIRATORY_TRACT | Status: DC
  Administered 2021-06-18 – 2021-06-22 (×8): 0.5 mg via RESPIRATORY_TRACT
  Filled 2021-06-18 (×10): qty 2

## 2021-06-18 MED ORDER — IPRATROPIUM-ALBUTEROL 0.5-2.5 (3) MG/3ML IN SOLN
3.0000 mL | Freq: Four times a day (QID) | RESPIRATORY_TRACT | Status: DC
  Administered 2021-06-18 – 2021-06-19 (×5): 3 mL via RESPIRATORY_TRACT
  Filled 2021-06-18 (×5): qty 3

## 2021-06-18 MED ORDER — ALBUTEROL SULFATE (2.5 MG/3ML) 0.083% IN NEBU
10.0000 mg/h | INHALATION_SOLUTION | Freq: Once | RESPIRATORY_TRACT | Status: AC
  Administered 2021-06-18: 10 mg/h via RESPIRATORY_TRACT
  Filled 2021-06-18: qty 3

## 2021-06-18 MED ORDER — MAGNESIUM SULFATE IN D5W 1-5 GM/100ML-% IV SOLN
1.0000 g | Freq: Once | INTRAVENOUS | Status: AC
  Administered 2021-06-18: 1 g via INTRAVENOUS
  Filled 2021-06-18: qty 100

## 2021-06-18 MED ORDER — BUTALBITAL-APAP-CAFFEINE 50-325-40 MG PO TABS: 1.0000 | ORAL_TABLET | ORAL | Status: DC | PRN

## 2021-06-18 MED ORDER — ARFORMOTEROL TARTRATE 15 MCG/2ML IN NEBU
15.0000 ug | INHALATION_SOLUTION | Freq: Two times a day (BID) | RESPIRATORY_TRACT | Status: DC
  Administered 2021-06-18 – 2021-06-20 (×4): 15 ug via RESPIRATORY_TRACT
  Filled 2021-06-18 (×6): qty 2

## 2021-06-18 MED ORDER — ACETAMINOPHEN 325 MG PO TABS
650.0000 mg | ORAL_TABLET | Freq: Four times a day (QID) | ORAL | Status: DC | PRN
  Administered 2021-06-20: 650 mg via ORAL
  Filled 2021-06-18: qty 2

## 2021-06-18 NOTE — ED Triage Notes (Signed)
Pt BIB EMS c/o SOB x 4 days, on nightime O2 at [facility]. Per EMS, pt has been using 4LNC throughout the day and still feels SOB. Pt SPO2 on RA was 85% per EMS. Pt received Albuterol 7.5mg  , Atrovent 0.5mg  and Solumedrol 125mg  IV PTA, SPO2 went up to 100% on 3LNC. H/O Emphysema and current smoker.  BP 142/78 HR 92 RR 20 CGM 128

## 2021-06-18 NOTE — ED Notes (Signed)
Pt walking to the restroom

## 2021-06-18 NOTE — H&P (Addendum)
History and Physical    [name] QQI:[id number] DOB: 1945-12-18 DOA: 06/18/2021  Referring MD/NP/PA: Dorie Rank, MD PCP: [name], MD  Patient coming from: home   Chief Complaint: Shortness of breath  I have personally briefly reviewed patient's old medical records in [place]   HPI: [name] is a 75 y.o. 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  She reports having congestion, productive cough with clear sputum production, and wheezing.  She had been using her oxygen more than just at night.  Denies having any significant fevers, chest pain, leg swelling, calf pain, or weight gain.  She still reports smoking anywhere from couple cigarettes up to 10 cigarettes/day on average.  In route with EMS patient was noted to have O2 saturations as low as 85% and had been given Solu-Medrol 125 mg IV and DuoNeb breathing treatment.  Her daughter adds that the patient had been very lethargic sleeping 14 hours a day in the bed.   ED Course: On admission into the emergency department patient was seen to be afebrile, respirations 17-37, blood pressures maintained, and O2 saturations noted to drop into the 40s when the patient got up and ambulated to the bathroom with improvement after being placed on 3 L of nasal cannula oxygen.  ABG was significant for pH 7.254, PCO2 89.9, PO2 54.  Orders were placed to place patient on BiPAP.  Chest x-ray noted cardiomegaly with slight linear increased interstitial markings suggestive of mild pulmonary edema with no large effusion or signs of pneumonia appreciated.  Labs significant for BNP 100.5.  Patient had been given a  continuous albuterol neb and magnesium sulfate 1 g IV.  TRH called to admit.  Patient was unable to tolerate BiPAP for very long and ripped off the mask.  Talks with the patient she states that she does not want BiPAP back on because she is claustrophobic and that Ativan would not help.  Review of Systems  Constitutional:  Positive for malaise/fatigue. Negative for fever.  HENT:  Positive for congestion. Negative for nosebleeds.   Eyes:  Negative for pain.  Respiratory:  Positive for cough, shortness of breath and wheezing.   Cardiovascular:  Negative for chest pain and leg swelling.  Gastrointestinal:  Negative for abdominal pain, diarrhea, nausea and vomiting.  Genitourinary:  Negative for dysuria and hematuria.  Musculoskeletal:  Negative for falls.  Skin:  Negative for rash.  Neurological:  Negative for loss of consciousness.  Endo/Heme/Allergies:  Positive for environmental allergies.  Psychiatric/Behavioral:  Positive for substance abuse. The patient is nervous/anxious.   All other systems reviewed and are negative.  Past Medical History:  Diagnosis Date   Abnormal LFTs 08/02/2011   Acute liver failure 06/19/2013   Acute renal failure ([place]) 06/19/2013   Acute respiratory failure ([place]) 06/19/2013   Altered mental status 08/01/2011   Angina    Anxiety    Anxiety state, unspecified 12/03/2013   Aortic stenosis    mild   Arthritis    "hands" (02/03/2017)   Breast cancer, right breast ([place]) dx'd 2008   CAD (coronary artery disease) of artery bypass graft 11/06/2013   CAD (coronary artery disease), MI R/O 08/01/2011   PCI in  2006 (bare metal stent, unknown artery) - [place]    CAP (community acquired pneumonia) 09/11/2018   CHF,  acute diastolic, BNP 4k on admissio 08/01/2011   Chronic bronchitis ([place])    Chronic respiratory failure ([place]) 02/02/2012   Compression fracture of L1 lumbar vertebra ([place]) 02/02/2012   COPD (chronic obstructive pulmonary disease) ([place])    oxygen-dependent 4LPM [place]    Coronary artery disease 2006   2 stents w/previous MI   Depressed    Diastolic CHF ([place])    Dyslipidemia    Family history of adverse reaction to anesthesia    "daughter had c-section; missed twice w/epidural"   GERD (gastroesophageal reflux disease)    Heart murmur    History of blood transfusion    "w/my colon OR"   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   History of nuclear stress test 08/03/2011   attenuation at apex - no perfusion defects    HTN (hypertension) 11/06/2013   Hyperkalemia, on ACE prior to admission 11/11/2011   Hypertension    Hyponatremia 01/31/2012   Migraine    "qod to q couple months since I was 21" (02/03/2017)   Mild aortic stenosis 08/01/2011   AVA 1.69 cm2 (06/02/2011)    Moderate to severe pulmonary hypertension (HCC)    NSTEMI (non-ST elevated myocardial infarction) ([place]) 2006   NSVT (nonsustained ventricular tachycardia) ([place])    h/o   On home oxygen therapy    "3L just at night; have it available prn duringtheday" (09/11/2018)   Pneumonia    "alot of times" (02/03/2017)   SBO (small bowel obstruction) ([place]) 04/23/2020   PARTIAL     Past Surgical History:  Procedure Laterality Date   BREAST BIOPSY Right 2008   BREAST LUMPECTOMY Right 2008   malignant   CATARACT EXTRACTION W/ INTRAOCULAR LENS  IMPLANT, BILATERAL Bilateral ~ 2013   [place]; 1971; [place]  11/2007   COLOSTOMY CLOSURE  07/2008   CORONARY ANGIOPLASTY WITH STENT PLACEMENT  2006   "2 stents"   ERCP N/A 09/05/2015   Procedure: ENDOSCOPIC RETROGRADE CHOLANGIOPANCREATOGRAPHY (ERCP);  Surgeon: Ladene Artist, MD;  Location: [facility];  Service: Endoscopy;  Laterality: N/A;   PARTIAL COLECTOMY  2009   for obstruction: temporary ostomy, later reversed.    RIGHT HEART CATHETERIZATION N/A 09/05/2013   Procedure: RIGHT HEART CATH;  Surgeon: Jolaine Artist, MD;  Location[facility] C[facility] LAB;  Service: Cardiovascular;   Laterality: N/A;   TRANSTHORACIC ECHOCARDIOGRAM  11/12/2011   EF 45-40%, normal systolic function, grade 1 diastolic dysfunction; ventricular septal flattening (D-sign); mild AS; trace-mild MR; LA mildly dilated; RV mod dilated; RA mod dilated; severe pulm HTN; elevated CVP     reports that she has been smoking cigarettes. She has a 26.50 pack-year smoking history. She has never used smokeless tobacco. She reports current alcohol use of about 1.0 standard drink per week. She reports that she does not use drugs.  Allergies  Allergen Reactions   Ceftriaxone Anaphylaxis and Other (See Comments)    *ROCEPHIN*  "Blow up like a balloon"   Hydroxyzine Shortness Of Breath and Other (See Comments)    Pt states med make her light headed, get sob sxs   Doxycycline Nausea And Vomiting   Lexapro [Escitalopram] Other (See Comments)    Pt states med make her dizzy    Family History  Problem Relation Age of Onset   Alzheimer's  disease Mother    Schizophrenia Sister    Breast cancer Sister        28s   Heart disease Sister    Diabetes Sister    Hyperlipidemia Brother    Cancer Neg Hx    Stroke Neg Hx    COPD Neg Hx    Depression Neg Hx    Drug abuse Neg Hx    Early death Neg Hx    Hypertension Neg Hx    Kidney disease Neg Hx     Prior to Admission medications   Medication Sig Start Date End Date Taking? Authorizing Provider  ALPRAZolam Duanne Moron) 1 MG tablet TAKE 1 TABLET BY MOUTH THREE TIMES A DAY AS NEEDED 03/16/21   [name], MD  budesonide-formoterol [facility]) 160-4.5 MCG/ACT inhaler Inhale 2 puffs into the lungs in the morning and at bedtime. 12/31/20   [name], MD  butalbital-acetaminophen-caffeine (FIORICET) [phone] MG tablet Take 1 tablet by mouth every 4 (four) hours as needed for headache. 02/11/21   [name], MD  cholecalciferol (VITAMIN D) 1000 units tablet Take 1,000 Units by mouth daily.    [provider]  denosumab ([name]) 60 MG/ML SOSY injection  Inject 60 mg into the skin every 6 (six) months. 09/17/20   [name], MD  diclofenac sodium (VOLTAREN) 1 % GEL Apply 4 g topically as needed (for pain). 01/14/19   [name], MD  ENULOSE 10 GM/15ML SOLN TAKE 45 MILLILITERS BY MOUTH 3 TIMES A DAY AS NEEDED FOR CONSTIPATION 02/09/21   [name], MD  fluticasone [facility]) 50 MCG/ACT nasal spray USE 2 SPRAYS INTO EACH NOSTRIL ONCE DAILY**REPEAT FOR 5 DAYS THEN STOP 04/18/16   [name], MD  furosemide (LASIX) 40 MG tablet TAKE 1 TABLET BY MOUTH TWICE A DAY 03/17/21   [name], MD  ipratropium (ATROVENT HFA) 17 MCG/ACT inhaler USE 2 PUFFS EVERY FOUR HOURS AS NEEDED 04/09/21   [name], MD  ipratropium-albuterol (DUONEB) 0.5-2.5 (3) MG/3ML SOLN Inhale 3 mLs into the lungs every 6 (six) hours as needed (for SOB, wheeze or cough). 02/02/21   [name], MD  levocetirizine (XYZAL) 5 MG tablet TAKE 1 TABLET BY MOUTH EVERY DAY IN THE EVENING 03/24/21   [name], MD  linaclotide [facility]) 72 MCG capsule Take 1 capsule (72 mcg total) by mouth daily before breakfast. 02/16/21   [name], MD  montelukast (SINGULAIR) 10 MG tablet TAKE 1 TABLET BY MOUTH EVERYDAY AT BEDTIME 06/05/21   [name], MD  nortriptyline (PAMELOR) 25 MG capsule Take 2 capsules (50 mg total) by mouth at bedtime. 02/02/21   [name], MD  Omega 3 1200 MG CAPS Take 1,200 mg by mouth every morning.     [provider]  OXYGEN-HELIUM IN Inhale 3 L into the lungs See admin instructions. Uses when needed during the day, uses continuous throughout the night    [provider]  pantoprazole (PROTONIX) 40 MG tablet TAKE 1 TABLET BY MOUTH EVERY DAY 11/15/20   [name], MD  Pitavastatin Calcium (LIVALO) 2 MG TABS Take 1 tablet (2 mg total) by mouth daily. 03/12/21   [name], MD  potassium chloride SA (KLOR-CON M20) 20 MEQ tablet Take 1 tablet (20 mEq total) by mouth daily. 03/12/21   [name], MD  VENTOLIN HFA  108 (90 Base) MCG/ACT inhaler INHALE 1-2 PUFFS BY MOUTH EVERY 6 HOURS AS NEEDED  FOR WHEEZE OR SHORTNESS OF BREATH 05/18/21   [name], MD  vitamin B-12 (CYANOCOBALAMIN) 1000 MCG tablet Take 1 tablet (1,000 mcg total) by mouth daily. 12/25/20   Mercy Riding, MD  vitamin C (ASCORBIC ACID) 500 MG tablet Take 500 mg by mouth daily.    [provider]    Physical Exam:  Constitutional: Elderly female who is lethargic but able to follow commands Vitals:   06/18/21 1145 06/18/21 1215 06/18/21 1230 06/18/21 1245  BP: (!) 135/52 117/61 117/62 (!) 141/58  Pulse: 91 91 91 91  Resp: (!) 36 (!) 23 18 (!) 26  Temp:      TempSrc:      SpO2: 92% (!) 85% (!) 85% 93%  Weight:      Height:       Eyes: PERRL, lids and conjunctivae normal ENMT: Mucous membranes are moist. Posterior pharynx clear of any exudate or lesions. .  Neck: normal, supple, no masses, no thyromegaly Respiratory: Expiratory wheezes appreciated in both lung fields.  Patient able to talk in nearly complete sentences.  O2 saturations currently maintained on 6 L of nasal cannula oxygen. Cardiovascular: Regular rate and rhythm, no murmurs / rubs / gallops. No extremity edema. 2+ pedal pulses. No carotid bruits.  Abdomen: no tenderness, no masses palpated. No hepatosplenomegaly. Bowel sounds positive.  Musculoskeletal: no clubbing / cyanosis.  Lymphedema noted of the right arm Skin: no rashes, lesions, ulcers. No induration Neurologic: CN 2-12 grossly intact. Sensation intact, DTR normal. Strength 5/5 in all 4.  Psychiatric: Poor insight. Alert and oriented x 3.  Agitated mood.     Labs on Admission: I have personally reviewed following labs and imaging studies  CBC: Recent Labs  Lab 06/18/21 0946 06/18/21 1237  WBC 8.7  --   HGB 13.4 13.6  HCT 43.8 40.0  MCV 105.3*  --   PLT 190  --    Basic Metabolic Panel: Recent Labs  Lab 06/18/21 0946 06/18/21 1237  NA 138 140  K 4.7 4.4  CL 101  --   CO2 28  --    GLUCOSE 108*  --   BUN 11  --   CREATININE 0.86  --   CALCIUM 8.9  --    GFR: Estimated Creatinine Clearance: 44.7 mL/min (by C-G formula based on SCr of 0.86 mg/dL). Liver Function Tests: No results for input(s): AST, ALT, ALKPHOS, BILITOT, PROT, ALBUMIN in the last 168 hours. No results for input(s): LIPASE, AMYLASE in the last 168 hours. No results for input(s): AMMONIA in the last 168 hours. Coagulation Profile: No results for input(s): INR, PROTIME in the last 168 hours. Cardiac Enzymes: No results for input(s): CKTOTAL, CKMB, CKMBINDEX, TROPONINI in the last 168 hours. BNP (last 3 results) No results for input(s): PROBNP in the last 8760 hours. HbA1C: No results for input(s): HGBA1C in the last 72 hours. CBG: No results for input(s): GLUCAP in the last 168 hours. Lipid Profile: No results for input(s): CHOL, HDL, LDLCALC, TRIG, CHOLHDL, LDLDIRECT in the last 72 hours. Thyroid Function Tests: No results for input(s): TSH, T4TOTAL, FREET4, T3FREE, THYROIDAB in the last 72 hours. Anemia Panel: No results for input(s): VITAMINB12, FOLATE, FERRITIN, TIBC, IRON, RETICCTPCT in the last 72 hours. Urine analysis:    Component Value Date/Time   COLORURINE YELLOW 12/23/2020 0055   APPEARANCEUR CLEAR 12/23/2020 0055   LABSPEC 1.014 12/23/2020 0055   PHURINE 7.0 12/23/2020 0055   GLUCOSEU NEGATIVE 12/23/2020 0055   GLUCOSEU NEGATIVE 12/19/2013  [place] 12/23/2020 [place] 12/23/2020 [place] 12/23/2020 0055   PROTEINUR 100 (A) 12/23/2020 0055   UROBILINOGEN 0.2 12/19/2013 1127   NITRITE NEGATIVE 12/23/2020 0055   LEUKOCYTESUR NEGATIVE 12/23/2020 0055   Sepsis Labs: No results found for this or any previous visit (from the past 240 hour(s)).   Radiological Exams on Admission: DG Chest Port 1 View  Result Date: 06/18/2021 CLINICAL DATA:  dyspnea EXAM: PORTABLE CHEST 1 VIEW COMPARISON:  April 2022 FINDINGS: The heart appears  enlarged. No pleural effusion. No pneumothorax. Slightly increased interstitial markings without lobar consolidation. Aortic calcifications. Surgical clips overlying the right axilla. IMPRESSION: Cardiomegaly with slightly increased interstitial markings suggestive of mild pulmonary edema. No large effusion or lobar consolidation. Electronically Signed   By: Albin Felling M.D.   On: 06/18/2021 09:47    EKG: Independently reviewed.  Sinus rhythm at 96 bpm  Assessment/Plan Acute on chronic respiratory failure with hypercapnia and hypoxia secondary to COPD exacerbation: Patient presents with complaints of cough and progressively worsening shortness of breath with productive sputum.  On physical exam noted to have significant wheezing.  Initial ABG was significant for pH 7.254, PCO2 89.9, and PO2 54 suggestive of respiratory acidosis.  BiPAP was placed and she was on it for 2-1/2 to 3 hours before taking off the mask and stating that she did not want to ever place it back on. -Admit to a progressive bed -Continuous pulse oximetry with nasal cannula oxygen maintain O2 saturation greater than 92% -BiPAP discontinued, but may require checking repeat ABG if patient becomes more lethargic -DuoNebs 4 times daily and albuterol nebs as needed for shortness of breath/wheezing -Brovana and budesonide nebs substitution for Symbicort -Solu-Medrol 40 mg IV twice daily -PT/OT to eval and treat tomorrow morning -May warrant formal consult to PCCM if patient becomes more lethargic overnight  Diastolic congestive heart failure: Chronic.  On physical exam patient does not appear grossly fluid overloaded and no signs of lower extremity edema.  BNP was noted to be mildly elevated at 100 and chest x-ray noted slightly increased interstitial markings suggestive of mild pulmonary edema, but she had last taken Lasix yesterday evening. -Strict intake and output and daily weights -Lasix 40 mg IV x1 dose due to missed dose of  Lasix -Resume home Lasix dose in a.m.  Vitamin B12 deficiency -Continue replacement  Anxiety -Continue nortriptyline and Xanax(decrease dose from 1 mg to 0.5 mg 3 times daily as needed for anxiety as this may be contributing to hypercapnia)  Hyperlipidemia -Continue pharmacy substitution of pravastatin  History of breast cancer right arm lymphedema: Status postlumpectomy in 2008 and since that time patient reports having visited a right arm lymphedema that is unchanged.  Tobacco abuse: Patient reports smoking up to 10 cigarettes/day on average here lately, but declines need of nicotine patch. -Continue to counsel on need of cessation of tobacco use  DVT prophylaxis: Lovenox Code Status: Limited code, do not placed on BiPAP Family Communication: Daughter updated over the phone Disposition Plan: Hopefully discharge home once medically Consults called: None Admission status: Inpatient, require more than 2 midnight stay  Norval Morton MD [facility]   If 7PM-7AM, please contact night-coverage   06/18/2021, 1:15 PM

## 2021-06-18 NOTE — ED Notes (Signed)
Pt very sob after ambulating to restroom. Pt SpO2 in the 40% after being hooked back up to monitor. Pt took some deep breaths and a few minutes later back at 90%. Will continue to monitor. Primary RN notified.

## 2021-06-18 NOTE — ED Notes (Signed)
Dr. Tamala Julian states pt can having something to drink. Water provided. MD at bedside.

## 2021-06-18 NOTE — ED Notes (Signed)
RT was called & is coming to admn the verified neb on the BIPAP machine.

## 2021-06-18 NOTE — ED Provider Notes (Signed)
[facility] EMERGENCY DEPARTMENT Provider Note   CSN: [id number] Arrival date & time: 06/18/21  0841     History Chief Complaint  Patient presents with   Shortness of Breath    [name] is a 75 y.o. female.   Shortness of Breath  Patient presents to the ER for complaints of shortness of breath.  Patient states she has a history of COPD.  She is normally on oxygen but only uses it at night.  She feels like it makes her sleepy if she uses it during the day.  She has had to use it more lately.  Patient felt like it could be related to allergies.  She has had increasing congestion and wheezing.  She is feeling more more short of breath.  No known fevers.  No chest pain.  She has not noticed any leg swelling.  Patient does have swelling in her right extremity but she does have chronic lymphedema.  She called EMS this morning because of her shortness of breath.  They noted she was 85% on room air.  Patient was treated with albuterol Atrovent and Solu-Medrol prior to arrival.  Patient states she has been cutting down on her smoking but still smokes 2 cigarettes/day  Past Medical History:  Diagnosis Date   Abnormal LFTs 08/02/2011   Acute liver failure 06/19/2013   Acute renal failure ([place]) 06/19/2013   Acute respiratory failure ([place]) 06/19/2013   Altered mental status 08/01/2011   Angina    Anxiety    Anxiety state, unspecified 12/03/2013   Aortic stenosis    mild   Arthritis    "hands" (02/03/2017)   Breast cancer, right breast ([place]) dx'd 2008   CAD (coronary artery disease) of artery bypass graft 11/06/2013   CAD (coronary artery disease), MI R/O 08/01/2011   PCI in 2006 (bare metal stent, unknown artery) - [place]    CAP (community acquired pneumonia) 09/11/2018   CHF,  acute diastolic, BNP 4k on admissio 08/01/2011   Chronic bronchitis (HCC)    Chronic respiratory failure ([place]) 02/02/2012   Compression fracture of L1 lumbar vertebra ([place]) 02/02/2012   COPD (chronic  obstructive pulmonary disease) ([place])    oxygen-dependent 4LPM [place]   Coronary artery disease 2006   2 stents w/previous MI   Depressed    Diastolic CHF ([place])    Dyslipidemia    Family history of adverse reaction to anesthesia    "daughter had c-section; missed twice w/epidural"   GERD (gastroesophageal reflux disease)    Heart murmur    History of blood transfusion    "w/my colon OR"   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   History of nuclear stress test 08/03/2011   attenuation at apex - no perfusion defects    HTN (hypertension) 11/06/2013   Hyperkalemia, on ACE prior to admission 11/11/2011   Hypertension    Hyponatremia 01/31/2012   Migraine    "qod to q couple months since I was 21" (02/03/2017)   Mild aortic stenosis 08/01/2011   AVA 1.69 cm2 (06/02/2011)    Moderate to severe pulmonary hypertension ([place])    NSTEMI (non-ST elevated myocardial infarction) ([place]) 2006   NSVT (nonsustained ventricular tachycardia) ([place])    h/o   On home oxygen therapy    "3L just at night; have it available prn duringtheday" (09/11/2018)   Pneumonia    "alot of times" (02/03/2017)   SBO (small bowel obstruction) ([place]) 04/23/2020   PARTIAL     Patient Active  Problem List   Diagnosis Date Noted   Edema of right upper arm 04/09/2021   Atherosclerosis of aorta (HCC) 01/16/2021   Hyperglycemia 01/15/2021   Vitamin B12 deficiency anemia due to intrinsic factor deficiency 01/15/2021   Iron deficiency 01/15/2021   Hyperparathyroidism ([place]) 11/04/2020   Acute seborrheic dermatitis 10/30/2020   Chronic idiopathic constipation 05/06/2020   Small bowel obstruction ([place]) 04/22/2020   Migraine headache without aura    Chronic renal disease, stage 3, moderately decreased glomerular filtration rate (GFR) between 30-59 mL/min/1.73 square meter 03/20/2019   COPD with acute exacerbation ([place]) 08/24/2017   Chronic respiratory failure with hypoxia ([place]) 08/24/2017   Anxiety 08/24/2017   Insomnia due to anxiety  and fear 08/22/2017   Oxygen dependent 08/16/2017   Mixed hyperlipidemia 03/31/2016   Other emphysema ([place]) 06/14/2014   Coronary artery disease involving native coronary artery of native heart without angina pectoris 11/06/2013   HTN (hypertension) 11/06/2013   Breast cancer of upper-outer quadrant of right female breast ([place]) 08/21/2013   Osteoporosis 01/01/2013   Chronic diastolic CHF (congestive heart failure) ([place]) 01/31/2012   Aortic valve stenosis 08/01/2011    Class: Chronic    Past Surgical History:  Procedure Laterality Date   BREAST BIOPSY Right 2008   BREAST LUMPECTOMY Right 2008   malignant   CATARACT EXTRACTION W/ INTRAOCULAR LENS  IMPLANT, BILATERAL Bilateral ~ 2013   [place]; 1971; [place]  11/2007   COLOSTOMY CLOSURE  07/2008   CORONARY ANGIOPLASTY WITH STENT PLACEMENT  2006   "2 stents"   ERCP N/A 09/05/2015   Procedure: ENDOSCOPIC RETROGRADE CHOLANGIOPANCREATOGRAPHY (ERCP);  Surgeon: Ladene Artist, MD;  Location: [facility];  Service: Endoscopy;  Laterality: N/A;   PARTIAL COLECTOMY  2009   for obstruction: temporary ostomy, later reversed.    RIGHT HEART CATHETERIZATION N/A 09/05/2013   Procedure: RIGHT HEART CATH;  Surgeon: Jolaine Artist, MD;  Location: [facility] CATH LAB;  Service: Cardiovascular;  Laterality: N/A;   TRANSTHORACIC ECHOCARDIOGRAM  11/12/2011   EF 37-10%, normal systolic function, grade 1 diastolic dysfunction; ventricular septal flattening (D-sign); mild AS; trace-mild MR; LA mildly dilated; RV mod dilated; RA mod dilated; severe pulm HTN; elevated CVP     OB History   No obstetric history on file.     Family History  Problem Relation Age of Onset   Alzheimer's disease Mother    Schizophrenia Sister    Breast cancer Sister        77s   Heart disease Sister    Diabetes Sister    Hyperlipidemia Brother    Cancer Neg Hx    Stroke Neg Hx    COPD Neg Hx     Depression Neg Hx    Drug abuse Neg Hx    Early death Neg Hx    Hypertension Neg Hx    Kidney disease Neg Hx     Social History   Tobacco Use   Smoking status: Some Days    Packs/day: 0.50    Years: 53.00    Pack years: 26.50    Types: Cigarettes   Smokeless tobacco: Never  Vaping Use   Vaping Use: Never used  Substance Use Topics   Alcohol use: Yes    Alcohol/week: 1.0 standard drink    Types: 1 Glasses of wine per week   Drug use: No    Home Medications Prior to Admission medications  Medication Sig Start Date End Date Taking? Authorizing Provider  ALPRAZolam Duanne Moron) 1 MG tablet TAKE 1 TABLET BY MOUTH THREE TIMES A DAY AS NEEDED 03/16/21   [name], MD  budesonide-formoterol [facility]) 160-4.5 MCG/ACT inhaler Inhale 2 puffs into the lungs in the morning and at bedtime. 12/31/20   [name], MD  butalbital-acetaminophen-caffeine (FIORICET) [phone] MG tablet Take 1 tablet by mouth every 4 (four) hours as needed for headache. 02/11/21   [name], MD  cholecalciferol (VITAMIN D) 1000 units tablet Take 1,000 Units by mouth daily.    [provider]  denosumab ([name]) 60 MG/ML SOSY injection Inject 60 mg into the skin every 6 (six) months. 09/17/20   [name], MD  diclofenac sodium (VOLTAREN) 1 % GEL Apply 4 g topically as needed (for pain). 01/14/19   [name], MD  ENULOSE 10 GM/15ML SOLN TAKE 45 MILLILITERS BY MOUTH 3 TIMES A DAY AS NEEDED FOR CONSTIPATION 02/09/21   [name], MD  fluticasone [facility]) 50 MCG/ACT nasal spray USE 2 SPRAYS INTO EACH NOSTRIL ONCE DAILY**REPEAT FOR 5 DAYS THEN STOP 04/18/16   [name], MD  furosemide (LASIX) 40 MG tablet TAKE 1 TABLET BY MOUTH TWICE A DAY 03/17/21   [name], MD  ipratropium (ATROVENT HFA) 17 MCG/ACT inhaler USE 2 PUFFS EVERY FOUR HOURS AS NEEDED 04/09/21   [name], MD  ipratropium-albuterol (DUONEB) 0.5-2.5 (3) MG/3ML SOLN Inhale 3 mLs into the lungs every 6 (six)  hours as needed (for SOB, wheeze or cough). 02/02/21   [name], MD  levocetirizine (XYZAL) 5 MG tablet TAKE 1 TABLET BY MOUTH EVERY DAY IN THE EVENING 03/24/21   [name], MD  linaclotide [facility]) 72 MCG capsule Take 1 capsule (72 mcg total) by mouth daily before breakfast. 02/16/21   [name], MD  montelukast (SINGULAIR) 10 MG tablet TAKE 1 TABLET BY MOUTH EVERYDAY AT BEDTIME 06/05/21   [name], MD  nortriptyline (PAMELOR) 25 MG capsule Take 2 capsules (50 mg total) by mouth at bedtime. 02/02/21   [name], MD  Omega 3 1200 MG CAPS Take 1,200 mg by mouth every morning.     [provider]  OXYGEN-HELIUM IN Inhale 3 L into the lungs See admin instructions. Uses when needed during the day, uses continuous throughout the night    [provider]  pantoprazole (PROTONIX) 40 MG tablet TAKE 1 TABLET BY MOUTH EVERY DAY 11/15/20   [name], MD  Pitavastatin Calcium (LIVALO) 2 MG TABS Take 1 tablet (2 mg total) by mouth daily. 03/12/21   [name], MD  potassium chloride SA (KLOR-CON M20) 20 MEQ tablet Take 1 tablet (20 mEq total) by mouth daily. 03/12/21   [name], MD  VENTOLIN HFA 108 (90 Base) MCG/ACT inhaler INHALE 1-2 PUFFS BY MOUTH EVERY 6 HOURS AS NEEDED FOR WHEEZE OR SHORTNESS OF BREATH 05/18/21   [name], MD  vitamin B-12 (CYANOCOBALAMIN) 1000 MCG tablet Take 1 tablet (1,000 mcg total) by mouth daily. 12/25/20   Mercy Riding, MD  vitamin C (ASCORBIC ACID) 500 MG tablet Take 500 mg by mouth daily.    [provider]    Allergies    Ceftriaxone, Hydroxyzine, Doxycycline, and Lexapro [escitalopram]  Review of Systems   Review of Systems  Respiratory:  Positive for shortness of breath.   All other systems reviewed and are negative.  Physical Exam  Updated Vital Signs BP (!) 141/58   Pulse 91   Temp 98.2 F (36.8 C) (Oral)   Resp (!) 26   Ht 1.575 m (5\' 2" )   Wt 51.7 kg   SpO2 93%   BMI 20.85 kg/m    Physical Exam Vitals and nursing note reviewed.  Constitutional:      Appearance: She is ill-appearing.  HENT:     Head: Normocephalic and atraumatic.     Right Ear: External ear normal.     Left Ear: External ear normal.  Eyes:     General: No scleral icterus.       Right eye: No discharge.        Left eye: No discharge.     Conjunctiva/sclera: Conjunctivae normal.  Neck:     Trachea: No tracheal deviation.  Cardiovascular:     Rate and Rhythm: Normal rate and regular rhythm.  Pulmonary:     Effort: Tachypnea and accessory muscle usage present. No respiratory distress.     Breath sounds: No stridor. Wheezing present. No rales.  Abdominal:     General: Bowel sounds are normal. There is no distension.     Palpations: Abdomen is soft.     Tenderness: There is no abdominal tenderness. There is no guarding or rebound.  Musculoskeletal:        General: No tenderness or deformity.     Cervical back: Neck supple.     Comments: No edema of the lower extremities, edema noted of the right upper extremity, small scab noted right forearm, slight surrounding erythema but no lymphangitic streaking or increased warmth patient states she does have history of breast cancer  Skin:    General: Skin is warm and dry.     Findings: No rash.  Neurological:     General: No focal deficit present.     Mental Status: She is alert and oriented to person, place, and time.     Cranial Nerves: No cranial nerve deficit (no facial droop, extraocular movements intact, no slurred speech).     Sensory: No sensory deficit.     Motor: No abnormal muscle tone or seizure activity.     Coordination: Coordination normal.  Psychiatric:        Mood and Affect: Mood normal.    ED Results / Procedures / Treatments   Labs (all labs ordered are listed, but only abnormal results are displayed) Labs Reviewed  BASIC METABOLIC PANEL - Abnormal; Notable for the following components:      Result Value   Glucose, Bld  108 (*)    All other components within normal limits  CBC - Abnormal; Notable for the following components:   MCV 105.3 (*)    All other components within normal limits  BRAIN NATRIURETIC PEPTIDE - Abnormal; Notable for the following components:   B Natriuretic Peptide 100.5 (*)    All other components within normal limits  I-STAT ARTERIAL BLOOD GAS, ED - Abnormal; Notable for the following components:   pH, Arterial 7.254 (*)    pCO2 arterial 89.9 (*)    pO2, Arterial 54 (*)    Bicarbonate 39.8 (*)    TCO2 42 (*)    Acid-Base Excess 9.0 (*)    All other components within normal limits    EKG EKG Interpretation  Date/Time:  Thursday June 18 2021 08:50:13 EDT Ventricular Rate:  96 PR Interval:  175 QRS Duration: 99 QT Interval:  352 QTC Calculation: 445 R Axis:   27 Text  Interpretation: Sinus rhythm Probable left atrial enlargement Low voltage, precordial leads Anteroseptal infarct, old No significant change since last tracing Confirmed by Dorie Rank [phone]) on 06/18/2021 8:52:34 AM  Radiology DG Chest Port 1 View  Result Date: 06/18/2021 CLINICAL DATA:  dyspnea EXAM: PORTABLE CHEST 1 VIEW COMPARISON:  April 2022 FINDINGS: The heart appears enlarged. No pleural effusion. No pneumothorax. Slightly increased interstitial markings without lobar consolidation. Aortic calcifications. Surgical clips overlying the right axilla. IMPRESSION: Cardiomegaly with slightly increased interstitial markings suggestive of mild pulmonary edema. No large effusion or lobar consolidation. Electronically Signed   By: Albin Felling M.D.   On: 06/18/2021 09:47    Procedures .Critical Care Performed by: Dorie Rank, MD Authorized by: Dorie Rank, MD   Critical care provider statement:    Critical care time (minutes):  45   Critical care was time spent personally by me on the following activities:  Discussions with consultants, evaluation of patient's response to treatment, examination of patient,  ordering and performing treatments and interventions, ordering and review of laboratory studies, ordering and review of radiographic studies, pulse oximetry, re-evaluation of patient's condition, obtaining history from patient or surrogate and review of old charts   Medications Ordered in ED Medications  magnesium sulfate IVPB 1 g 100 mL (0 g Intravenous Stopped 06/18/21 1144)  albuterol (PROVENTIL) (2.5 MG/3ML) 0.083% nebulizer solution (10 mg/hr Nebulization Given 06/18/21 0956)    ED Course  I have reviewed the triage vital signs and the nursing notes.  Pertinent labs & imaging results that were available during my care of the patient were reviewed by me and considered in my medical decision making (see chart for details).  Clinical Course as of 06/18/21 1310  Thu Jun 18, 2021  1002 Chest x-ray shows mild pulmonary edema [JK]  1154 CBC unremarkable.  Metabolic panel unremarkable.  BNP at 100 [JK]  1155 Oxygen saturation remains at 89% [JK]  1245 ABG does show pH of 7.2 and elevated PCO2.  We will start patient on BiPAP [JK]    Clinical Course User Index [JK] Dorie Rank, MD   MDM Rules/Calculators/A&P                           Patient presented to the ED for evaluation of shortness of breath.  Patient has history of COPD.  She was had notable wheezing on EMS exam as well as mine.  Patient was treated with albuterol Atrovent steroids.  She was also given magnesium.  Chest x-ray showed mild pulmonary edema and BNP only slightly elevated.  I suspect COPD is the primary component of her dyspnea.  Patient's ABG does show hypercapnia and respiratory acidosis.  Patient has been started on BiPAP.  She still is able to answer questions appropriately.  Will hold off on intubation right now and continue with BiPAP.  We will need to monitor closely. Final Clinical Impression(s) / ED Diagnoses Final diagnoses:  COPD exacerbation ([place])  Acute hypercapnic respiratory failure ([place])     Dorie Rank,  MD 06/18/21 1310

## 2021-06-18 NOTE — ED Notes (Signed)
Pt took off Bipap and refusing to put it back on. Pt frustrated because she has to use the restroom and wants to walk there. Pt made aware that when she ambulated earlier her O2 dropped to the 13s. Pt states she will use a bed pan. Pt still refusing to wear Bipap after placing pt on bedpan. O2 dropped to 72%. Pt placed on 6L [place]. O2 at 94%.   Pt requesting something to drink. Pt made aware she is NPO at this time. Pt states "It is my right to not have to wear the mask and to drink something." Pt made aware that this Rn is not comfortable giving her anything to drink without an order from the doctor.

## 2021-06-19 DIAGNOSIS — J9622 Acute and chronic respiratory failure with hypercapnia: Secondary | ICD-10-CM | POA: Diagnosis not present

## 2021-06-19 DIAGNOSIS — J9621 Acute and chronic respiratory failure with hypoxia: Secondary | ICD-10-CM | POA: Diagnosis not present

## 2021-06-19 LAB — COMPREHENSIVE METABOLIC PANEL
ALT: 15 U/L (ref 0–44)
AST: 19 U/L (ref 15–41)
Albumin: 2.9 g/dL — ABNORMAL LOW (ref 3.5–5.0)
Alkaline Phosphatase: 52 U/L (ref 38–126)
Anion gap: 5 (ref 5–15)
BUN: 9 mg/dL (ref 8–23)
CO2: 37 mmol/L — ABNORMAL HIGH (ref 22–32)
Calcium: 8.6 mg/dL — ABNORMAL LOW (ref 8.9–10.3)
Chloride: 94 mmol/L — ABNORMAL LOW (ref 98–111)
Creatinine, Ser: 0.91 mg/dL (ref 0.44–1.00)
GFR, Estimated: >60 mL/min (ref >60)
Glucose, Bld: 132 mg/dL — ABNORMAL HIGH (ref 70–99)
Potassium: 4.9 mmol/L (ref 3.5–5.1)
Sodium: 136 mmol/L (ref 135–145)
Total Bilirubin: 0.4 mg/dL (ref 0.3–1.2)
Total Protein: 6.7 g/dL (ref 6.5–8.1)

## 2021-06-19 LAB — BLOOD GAS, ARTERIAL
Acid-Base Excess: 9.7 mmol/L — ABNORMAL HIGH (ref 0.0–2.0)
Bicarbonate: 36.3 mmol/L — ABNORMAL HIGH (ref 20.0–28.0)
Drawn by: 51185
FIO2: 32
O2 Saturation: 91.2 %
Patient temperature: 37
pCO2 arterial: 77.4 mmHg (ref 32.0–48.0)
pH, Arterial: 7.293 — ABNORMAL LOW (ref 7.350–7.450)
pO2, Arterial: 63.2 mmHg — ABNORMAL LOW (ref 83.0–108.0)

## 2021-06-19 LAB — CBC
HCT: 39.3 % (ref 36.0–46.0)
Hemoglobin: 12.5 g/dL (ref 12.0–15.0)
MCH: 32.4 pg (ref 26.0–34.0)
MCHC: 31.8 g/dL (ref 30.0–36.0)
MCV: 101.8 fL — ABNORMAL HIGH (ref 80.0–100.0)
Platelets: 193 10*3/uL (ref 150–400)
RBC: 3.86 MIL/uL — ABNORMAL LOW (ref 3.87–5.11)
RDW: 13 % (ref 11.5–15.5)
WBC: 5.7 10*3/uL (ref 4.0–10.5)
nRBC: 0 % (ref 0.0–0.2)

## 2021-06-19 MED ORDER — ALPRAZOLAM 0.5 MG PO TABS
1.0000 mg | ORAL_TABLET | Freq: Three times a day (TID) | ORAL | Status: DC | PRN
  Administered 2021-06-19 – 2021-06-22 (×7): 1 mg via ORAL
  Filled 2021-06-19 (×7): qty 2

## 2021-06-19 NOTE — Consult Note (Signed)
   [facility] Inpatient Consult   06/19/2021  [name] 1946/04/03 [id number]  [place] [facility] [ACO] Patient: Medicare CMS DCE  Primary Care Provider:  [name], MD is an Embedded provider at [facility] which has a Chronic Care Management team and program  Patient screened for hospitalization with noted high risk score for unplanned readmission risk and to assess for potential [place] Management service needs for post hospital transition.  Review of patient's medical record reveals patient is being followed for progression and post hospital needs. Met with the patient at the bedside and she endorses her primary care provider and explained potential follow up with the Embedded CCM program.  Plan:  Continue to follow progress and disposition to assess for post hospital care management needs.  Patient can be referred to his Embedded CCM team for post hospital follow up needs.    For questions contact:   Natividad Brood, RN BSN [facility] Liaison  [phone] business mobile phone Toll free office [phone]  Fax number: [phone] [email or web address]

## 2021-06-19 NOTE — Progress Notes (Addendum)
PROGRESS NOTE    [name]  ZOX:[id number] DOB: [birth year] DOA: 06/18/2021 PCP: [name], MD   Brief Narrative:  HPI: [name] is a 75 y.o. 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  She reports having congestion, productive cough with clear sputum production, and wheezing.  She had been using her oxygen more than just at night.  Denies having any significant fevers, chest pain, leg swelling, calf pain, or weight gain.  She still reports smoking anywhere from couple cigarettes up to 10 cigarettes/day on average.  In route with EMS patient was noted to have O2 saturations as low as 85% and had been given Solu-Medrol 125 mg IV and DuoNeb breathing treatment.  Her daughter adds that the patient had been very lethargic sleeping 14 hours a day in the bed.     ED Course: On admission into the emergency department patient was seen to be afebrile, respirations 17-37, blood pressures maintained, and O2 saturations noted to drop into the 40s when the patient got up and ambulated to the bathroom with improvement after being placed on 3 L of nasal cannula oxygen.  ABG was significant for pH 7.254, PCO2 89.9, PO2 54.  Orders were placed to place patient on BiPAP.  Chest x-ray noted cardiomegaly with slight linear increased interstitial markings suggestive of mild pulmonary edema with no large effusion or signs of pneumonia appreciated.  Labs significant for BNP 100.5.  Patient had been given a continuous albuterol neb and magnesium sulfate 1 g IV.  TRH called to admit.  Patient was unable to tolerate BiPAP for very long and ripped off the mask.  Talks with the patient  she states that she does not want BiPAP back on because she is claustrophobic and that Ativan would not help.    Assessment & Plan:   Principal Problem:   Acute on chronic respiratory failure with hypoxia and hypercapnia (HCC) Active Problems:   Chronic diastolic CHF (congestive heart failure) (HCC)   Mixed hyperlipidemia   COPD with acute exacerbation (HCC)   Anxiety   Acute on chronic respiratory failure with hypercapnia and hypoxia secondary to COPD exacerbation and acute on chronic diastolic congestive heart failure: Initial ABG was significant for pH 7.254, PCO2 89.9, and PO2 54 suggestive of respiratory acidosis.  BiPAP was placed and she was on it for 2-1/2 to 3 hours before taking off the mask and stating that she did not want to ever place it back on.  Currently on 3 L of oxygen.  Repeat ABG once again shows CO2 77, improved from 89 but still not well controlled.  I discussed in length with the patient about importance of BiPAP however she was adamant and continued to strongly refuse that.  I did inform her that without BiPAP, her CO2 might go up, causing lethargy and eventually she might need intubation.  She understands that.  Monitor closely.  Nurse informed.  Continue Solu-Medrol.  Although her BNP is not significantly elevated but clinically and on examination, she does seem to have acute on chronic diastolic congestive heart failure so I will start her on Lasix 40 mg IV twice daily.  Strict I's and O's, low-sodium diet.  Continue Singulair.  Bronchodilators.   Vitamin B12 deficiency -Continue replacement   Anxiety: Continue nortriptyline and Xanax.  Admitting hospitalist had put her on reduced dose of Xanax 0.5 mg 3 times daily for as needed while patient takes 1 mg at home.  This was done due to risk of her being more drowsy while she is retaining CO2.  Discussed with patient.  Patient is very adamant and wants Xanax her way.  We will increase back to 1 mg.  She understands the  risks as well.   Hyperlipidemia: Continue statin.   History of breast cancer right arm lymphedema: Status postlumpectomy in 2008 and since that time patient reports having visited a right arm lymphedema that is unchanged.   Tobacco abuse: Patient reports smoking up to 10 cigarettes/day on average here lately, but declines nicotine patch.  Counseling to quit provided.  GERD: PPI  DVT prophylaxis: enoxaparin (LOVENOX) injection 40 mg Start: 06/18/21 1345   Code Status: Partial Code  Family Communication:  None present at bedside.  Plan of care discussed with patient in length and he verbalized understanding and agreed with it.  Status is: Inpatient  Remains inpatient appropriate because:Hemodynamically unstable  Dispo: The patient is from: Home              Anticipated d/c is to: Home              Patient currently is not medically stable to d/c.   Difficult to place patient No        Estimated body mass index is 27.22 kg/m as calculated from the following:   Height as of this encounter: 5\' 2"  (1.575 m).   Weight as of this encounter: 67.5 kg.     Nutritional Assessment: Body mass index is 27.22 kg/m.Marland Kitchen Seen by dietician.  I agree with the assessment and plan as outlined below: Nutrition Status:        .  Skin Assessment: I have examined the patient's skin and I agree with the wound assessment as performed by the wound care RN as outlined below:    Consultants:  None  Procedures:  None  Antimicrobials:  Anti-infectives (From admission, onward)    None          Subjective: Seen and examined.  She states that her breathing is better than yesterday.  She cannot speak in full sentences and she is fully alert and oriented.  Very adamant on not using CPAP again.  Objective: Vitals:   06/19/21 0752 06/19/21 0800 06/19/21 1106 06/19/21 1107  BP:  134/71 (!) 151/70   Pulse:   82   Resp:  19 18   Temp:   98 F (36.7 C)   TempSrc:   Oral   SpO2: 100%  92% 94% 91%  Weight:      Height:        Intake/Output Summary (Last 24 hours) at 06/19/2021 1231 Last data filed at 06/19/2021 1200 Gross per 24 hour  Intake 120 ml  Output 2300 ml  Net -2180 ml   Filed Weights   06/18/21 0848 06/19/21 0356  Weight: 51.7 kg 67.5 kg    Examination:  General exam: Appears calm and comfortable  Respiratory system: Diffuse expiratory wheezes with rhonchi and crackles bilaterally. Respiratory effort normal. Cardiovascular system: S1 & S2 heard, RRR. No JVD, murmurs, rubs, gallops or clicks. No pedal edema. Gastrointestinal system: Abdomen is nondistended, soft and nontender. No organomegaly or masses felt. Normal bowel sounds heard. Central nervous system: Alert and oriented.  No focal neurological deficits. Extremities: Symmetric 5 x 5 power. Skin: No rashes, lesions or ulcers Psychiatry: Judgement and insight appear normal. Mood & affect appropriate.    Data Reviewed: I have personally reviewed following labs and imaging studies  CBC: Recent Labs  Lab 06/18/21 0946 06/18/21 1237 06/19/21 0330  WBC 8.7  --  5.7  HGB 13.4 13.6 12.5  HCT 43.8 40.0 39.3  MCV 105.3*  --  101.8*  PLT 190  --  545   Basic Metabolic Panel: Recent Labs  Lab 06/18/21 0946 06/18/21 1237 06/19/21 0330  NA 138 140 136  K 4.7 4.4 4.9  CL 101  --  94*  CO2 28  --  37*  GLUCOSE 108*  --  132*  BUN 11  --  9  CREATININE 0.86  --  0.91  CALCIUM 8.9  --  8.6*   GFR: Estimated Creatinine Clearance: 48.1 mL/min (by C-G formula based on SCr of 0.91 mg/dL). Liver Function Tests: Recent Labs  Lab 06/19/21 0330  AST 19  ALT 15  ALKPHOS 52  BILITOT 0.4  PROT 6.7  ALBUMIN 2.9*   No results for input(s): LIPASE, AMYLASE in the last 168 hours. No results for input(s): AMMONIA in the last 168 hours. Coagulation Profile: No results for input(s): INR, PROTIME in the last 168 hours. Cardiac Enzymes: No results for input(s): CKTOTAL, CKMB, CKMBINDEX, TROPONINI in  the last 168 hours. BNP (last 3 results) No results for input(s): PROBNP in the last 8760 hours. HbA1C: No results for input(s): HGBA1C in the last 72 hours. CBG: No results for input(s): GLUCAP in the last 168 hours. Lipid Profile: No results for input(s): CHOL, HDL, LDLCALC, TRIG, CHOLHDL, LDLDIRECT in the last 72 hours. Thyroid Function Tests: No results for input(s): TSH, T4TOTAL, FREET4, T3FREE, THYROIDAB in the last 72 hours. Anemia Panel: No results for input(s): VITAMINB12, FOLATE, FERRITIN, TIBC, IRON, RETICCTPCT in the last 72 hours. Sepsis Labs: No results for input(s): PROCALCITON, LATICACIDVEN in the last 168 hours.  Recent Results (from the past 240 hour(s))  Resp Panel by RT-PCR (Flu A&B, Covid) Nasopharyngeal Swab     Status: None   Collection Time: 06/18/21  1:51 PM   Specimen: Nasopharyngeal Swab; Nasopharyngeal(NP) swabs in vial transport medium  Result Value Ref Range Status   SARS Coronavirus 2 by RT PCR NEGATIVE NEGATIVE Final    Comment: (NOTE) SARS-CoV-2 target nucleic acids are NOT DETECTED.  The SARS-CoV-2 RNA is generally detectable in upper respiratory specimens during the acute phase of infection. The lowest concentration of SARS-CoV-2 viral copies this assay can detect is 138 copies/mL. A negative result does not preclude SARS-Cov-2 infection and should not be used as the sole basis for treatment or other patient management decisions. A negative result may occur with  improper specimen collection/handling, submission of specimen other than nasopharyngeal swab, presence of viral mutation(s) within the areas targeted by this assay, and inadequate number of viral copies(<138 copies/mL). A negative result must be combined with clinical observations, patient history, and epidemiological information. The expected result is Negative.  Fact Sheet for Patients:  EntrepreneurPulse.com.au  Fact Sheet for Healthcare Providers:   IncredibleEmployment.be  This test is no t yet approved or cleared by the [place] FDA and  has been authorized for detection and/or diagnosis of SARS-CoV-2 by FDA under an Emergency Use Authorization (EUA). This EUA will remain  in effect (meaning this test can be used) for the duration of the COVID-19 declaration under Section 564(b)(1)  of the Act, 21 U.S.C.section 360bbb-3(b)(1), unless the authorization is terminated  or revoked sooner.       Influenza A by PCR NEGATIVE NEGATIVE Final   Influenza B by PCR NEGATIVE NEGATIVE Final    Comment: (NOTE) The Xpert Xpress SARS-CoV-2/FLU/RSV plus assay is intended as an aid in the diagnosis of influenza from Nasopharyngeal swab specimens and should not be used as a sole basis for treatment. Nasal washings and aspirates are unacceptable for Xpert Xpress SARS-CoV-2/FLU/RSV testing.  Fact Sheet for Patients: EntrepreneurPulse.com.au  Fact Sheet for Healthcare Providers: IncredibleEmployment.be  This test is not yet approved or cleared by the [place] FDA and has been authorized for detection and/or diagnosis of SARS-CoV-2 by FDA under an Emergency Use Authorization (EUA). This EUA will remain in effect (meaning this test can be used) for the duration of the COVID-19 declaration under Section 564(b)(1) of the Act, 21 U.S.C. section 360bbb-3(b)(1), unless the authorization is terminated or revoked.  Performed at [facility], [place] [address] [place], [place] [address]       Radiology Studies: DG Chest Port 1 View  Result Date: 06/18/2021 CLINICAL DATA:  dyspnea EXAM: PORTABLE CHEST 1 VIEW COMPARISON:  April 2022 FINDINGS: The heart appears enlarged. No pleural effusion. No pneumothorax. Slightly increased interstitial markings without lobar consolidation. Aortic calcifications. Surgical clips overlying the right axilla. IMPRESSION: Cardiomegaly with slightly  increased interstitial markings suggestive of mild pulmonary edema. No large effusion or lobar consolidation. Electronically Signed   By: Albin Felling M.D.   On: 06/18/2021 09:47    Scheduled Meds:  arformoterol  15 mcg Nebulization BID   budesonide (PULMICORT) nebulizer solution  0.5 mg Nebulization BID   enoxaparin (LOVENOX) injection  40 mg Subcutaneous Q24H   fluticasone  1 spray Each Nare Daily   furosemide  40 mg Intravenous BID   ipratropium-albuterol  3 mL Nebulization QID   linaclotide  72 mcg Oral QAC breakfast   methylPREDNISolone (SOLU-MEDROL) injection  40 mg Intravenous Q12H   Followed by   Derrill Memo ON 06/20/2021] predniSONE  40 mg Oral Q breakfast   montelukast  10 mg Oral QHS   nortriptyline  50 mg Oral QHS   pantoprazole  40 mg Oral Daily   pravastatin  40 mg Oral q1800   sodium chloride flush  10-40 mL Intracatheter Q12H   sodium chloride flush  3 mL Intravenous Q12H   vitamin B-12  1,000 mcg Oral Daily   Continuous Infusions:   LOS: 1 day   Time spent: 37 minutes   Darliss Cheney, MD [facility]  06/19/2021, 12:31 PM  Please page via [place] and do not message via secure chat for anything urgent. Secure chat can be used for anything non urgent.  How to contact the [facility] Attending or Consulting provider [place] or covering provider during after hours [place], for this patient?  Check the care team in [facility] and look for a) attending/consulting [facility] provider listed and b) the [facility] team listed. Page or secure chat [phone]. Log into [email or web address] and use [place]'s universal password to access. If you do not have the password, please contact the hospital operator. Locate the [facility] provider you are looking for under [facility] and page to a number that you can be directly reached. If you still have difficulty reaching the provider, please page the [facility] (Director on Call) for the Hospitalists listed on amion for assistance.

## 2021-06-19 NOTE — Progress Notes (Signed)
Lab called with a critical pCO2 lab value of 77.4. Paged Pahwani MD to notify.

## 2021-06-20 DIAGNOSIS — F419 Anxiety disorder, unspecified: Secondary | ICD-10-CM | POA: Diagnosis not present

## 2021-06-20 DIAGNOSIS — F17200 Nicotine dependence, unspecified, uncomplicated: Secondary | ICD-10-CM

## 2021-06-20 DIAGNOSIS — J441 Chronic obstructive pulmonary disease with (acute) exacerbation: Secondary | ICD-10-CM | POA: Diagnosis not present

## 2021-06-20 DIAGNOSIS — J9621 Acute and chronic respiratory failure with hypoxia: Secondary | ICD-10-CM | POA: Diagnosis not present

## 2021-06-20 DIAGNOSIS — I5033 Acute on chronic diastolic (congestive) heart failure: Secondary | ICD-10-CM

## 2021-06-20 LAB — CBC WITH DIFFERENTIAL/PLATELET
Abs Immature Granulocytes: 0.03 10*3/uL (ref 0.00–0.07)
Basophils Absolute: 0 10*3/uL (ref 0.0–0.1)
Basophils Relative: 0 %
Eosinophils Absolute: 0 10*3/uL (ref 0.0–0.5)
Eosinophils Relative: 0 %
HCT: 41.2 % (ref 36.0–46.0)
Hemoglobin: 13.2 g/dL (ref 12.0–15.0)
Immature Granulocytes: 0 %
Lymphocytes Relative: 9 %
Lymphs Abs: 0.7 10*3/uL (ref 0.7–4.0)
MCH: 31.8 pg (ref 26.0–34.0)
MCHC: 32 g/dL (ref 30.0–36.0)
MCV: 99.3 fL (ref 80.0–100.0)
Monocytes Absolute: 0.6 10*3/uL (ref 0.1–1.0)
Monocytes Relative: 7 %
Neutro Abs: 7 10*3/uL (ref 1.7–7.7)
Neutrophils Relative %: 84 %
Platelets: 225 10*3/uL (ref 150–400)
RBC: 4.15 MIL/uL (ref 3.87–5.11)
RDW: 12.7 % (ref 11.5–15.5)
WBC: 8.4 10*3/uL (ref 4.0–10.5)
nRBC: 0 % (ref 0.0–0.2)

## 2021-06-20 LAB — BASIC METABOLIC PANEL
Anion gap: 6 (ref 5–15)
BUN: 16 mg/dL (ref 8–23)
CO2: 39 mmol/L — ABNORMAL HIGH (ref 22–32)
Calcium: 9.9 mg/dL (ref 8.9–10.3)
Chloride: 91 mmol/L — ABNORMAL LOW (ref 98–111)
Creatinine, Ser: 0.92 mg/dL (ref 0.44–1.00)
GFR, Estimated: >60 mL/min (ref >60)
Glucose, Bld: 117 mg/dL — ABNORMAL HIGH (ref 70–99)
Potassium: 4.1 mmol/L (ref 3.5–5.1)
Sodium: 136 mmol/L (ref 135–145)

## 2021-06-20 MED ORDER — LEVALBUTEROL HCL 0.63 MG/3ML IN NEBU: 0.6300 mg | INHALATION_SOLUTION | RESPIRATORY_TRACT | Status: DC | PRN

## 2021-06-20 MED ORDER — IPRATROPIUM BROMIDE 0.02 % IN SOLN
0.5000 mg | Freq: Four times a day (QID) | RESPIRATORY_TRACT | Status: DC
  Administered 2021-06-20 – 2021-06-22 (×9): 0.5 mg via RESPIRATORY_TRACT
  Filled 2021-06-20 (×9): qty 2.5

## 2021-06-20 MED ORDER — FUROSEMIDE 10 MG/ML IJ SOLN
40.0000 mg | Freq: Every day | INTRAMUSCULAR | Status: DC
  Administered 2021-06-21 – 2021-06-22 (×2): 40 mg via INTRAVENOUS
  Filled 2021-06-20 (×2): qty 4

## 2021-06-20 MED ORDER — LEVALBUTEROL HCL 0.63 MG/3ML IN NEBU
0.6300 mg | INHALATION_SOLUTION | Freq: Four times a day (QID) | RESPIRATORY_TRACT | Status: DC
  Administered 2021-06-20 – 2021-06-22 (×9): 0.63 mg via RESPIRATORY_TRACT
  Filled 2021-06-20 (×9): qty 3

## 2021-06-20 MED ORDER — AZITHROMYCIN 250 MG PO TABS
250.0000 mg | ORAL_TABLET | Freq: Every day | ORAL | Status: DC
  Administered 2021-06-21 – 2021-06-22 (×2): 250 mg via ORAL
  Filled 2021-06-20 (×2): qty 1

## 2021-06-20 MED ORDER — IPRATROPIUM-ALBUTEROL 0.5-2.5 (3) MG/3ML IN SOLN
3.0000 mL | Freq: Three times a day (TID) | RESPIRATORY_TRACT | Status: DC
  Administered 2021-06-20: 3 mL via RESPIRATORY_TRACT
  Filled 2021-06-20: qty 3

## 2021-06-20 MED ORDER — POLYVINYL ALCOHOL 1.4 % OP SOLN
1.0000 [drp] | OPHTHALMIC | Status: DC | PRN
  Administered 2021-06-20: 1 [drp] via OPHTHALMIC
  Filled 2021-06-20: qty 15

## 2021-06-20 MED ORDER — AZITHROMYCIN 250 MG PO TABS
500.0000 mg | ORAL_TABLET | Freq: Every day | ORAL | Status: AC
  Administered 2021-06-20: 500 mg via ORAL
  Filled 2021-06-20: qty 2

## 2021-06-20 NOTE — Progress Notes (Signed)
Notified by CCMD., Pt having frequent bigeminy, MD aware made orders.

## 2021-06-20 NOTE — Progress Notes (Signed)
PROGRESS NOTE  [name] OIZ:[id number] DOB: 1946/07/25   PCP: [name], MD  Patient is from: Home.  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  Initial ABG with acute on chronic respiratory acidosis.  CXR raises concern for CHF.  BNP 100.  Started on BiPAP, systemic steroid with breathing treatments and IV Lasix.   Subjective: Seen and examined earlier this morning.  No major events overnight of this morning.  She says she feels better but not quite at baseline.  Complains of dry eye and asking for eyedrop.  Saturation fluctuates from 84-92 on 2 L by [place] at rest.  She denies GI or UTI symptoms.  Objective: Vitals:   06/20/21 0351 06/20/21 0704 06/20/21 0900 06/20/21 1231  BP:    (!) 143/55  Pulse:    (!) 55  Resp:    (!) 22  Temp:    100.1 F (37.8 C)  TempSrc:    Oral  SpO2:  93% (!) 87% (!) 89%  Weight: 52.5 kg     Height:        Intake/Output Summary (Last 24 hours) at 06/20/2021 1329 Last data filed at 06/20/2021 1230 Gross per 24 hour  Intake 480 ml  Output 2601 ml  Net -2121 ml   Filed Weights   06/18/21 0848 06/19/21 0356 06/20/21 0351  Weight: 51.7 kg 67.5 kg 52.5 kg    Examination:  GENERAL: No apparent distress.  Nontoxic. HEENT: MMM.  Vision and hearing grossly intact.  NECK: Supple.  No apparent JVD.  RESP: 84 to 92% on 2 L by [place].  No IWOB.  Rhonchi bilaterally. CVS:  RRR. Heart sounds normal.  ABD/GI/GU: BS+.  Abd soft, NTND.  MSK/EXT:  Moves extremities. No apparent deformity. No edema.  SKIN: no apparent skin lesion or wound NEURO: Awake, alert and oriented appropriately.  No apparent focal neuro deficit. PSYCH: Calm. Normal affect.    Procedures:  None  Microbiology summarized: [id number] and influenza PCR nonreactive.  Assessment & Plan: Acute on chronic RF with hypoxia and hypercapnia-most likely due to COPD exacerbation with some degree of CHF exacerbation.  COPD exacerbation likely due to ongoing tobacco abuse, noncompliance and benzodiazepine use.  Initial ABG 7.25/90/54/42 in line with COPD.  Initially started on BiPAP but did not want to continue.  Respiratory status improved.  Saturation fluctuating between 84 and 92 on 2 L. -Continue systemic steroid and home Singulair.  Add Z-Pak -Change nebulizers to scheduled Xopenex and Atrovent in the setting of bigeminy -Minimum oxygen to keep saturation above 88%.  Discussed with patient and RN. -Encouraged to quit smoking cigarettes, which is likely the most important -Incentive spirometry/OOB/PT/OT -Needs triple therapy but she says Trelegy Ellipta did not work for her -Patient prefers to remain full code except for BiPAP  Chronic diastolic CHF: TTE in 3/97 with LVEF of 55 to 60%, G2-DD.  BNP 100 (lower than baseline).  Appears euvolemic on exam but after IV diuretics.  CXR reported as  with cardiomegaly and slightly increased interstitial marking which seems worse compared to prior x-ray.  She had about 2.7 L UOP with IV Lasix.  Renal function stable. -Decrease IV Lasix to 40 mg daily -Closely monitor fluid status, renal functions and electrolytes   Anxiety: Stable. -Adamant about getting his home dose of Xanax of 1 mg 3 times daily. -Continue home nortriptyline  Vitamin B12 deficiency -Continue replacement   Hyperlipidemia: Continue statin.   History of breast cancer right arm lymphedema: lumpectomy in 2008 and since that time  patient reports having visited a right arm lymphedema that is unchanged.   Tobacco abuse: Smokes about 7 to 10 cigarettes a day. -Strongly encourage cessation   GERD:  -PPI   Generalized weakness/physical deconditioning -PT/OT  Body mass index is 21.17 kg/m.         DVT prophylaxis:  enoxaparin (LOVENOX) injection 40 mg Start: 06/18/21 1345  Code Status: Full code except for BiPAP Family Communication: Patient and/or RN. Available if any question.  Level of care: Progressive Status is: Inpatient  Remains inpatient appropriate because:IV treatments appropriate due to intensity of illness or inability to take PO and Inpatient level of care appropriate due to severity of illness  Dispo: The patient is from: Home              Anticipated d/c is to: Home              Patient currently is not medically stable to d/c.   Difficult to place patient No       Consultants:  None   Sch Meds:  Scheduled Meds:  budesonide (PULMICORT) nebulizer solution  0.5 mg Nebulization BID   enoxaparin (LOVENOX) injection  40 mg Subcutaneous Q24H   fluticasone  1 spray Each Nare Daily   [START ON 06/21/2021] furosemide  40 mg Intravenous Daily   ipratropium  0.5 mg Nebulization Q6H   levalbuterol  0.63 mg Nebulization Q6H   linaclotide  72 mcg Oral QAC breakfast   montelukast  10 mg Oral QHS   nortriptyline  50 mg Oral QHS   pantoprazole  40 mg Oral Daily   pravastatin  40 mg Oral q1800   predniSONE  40 mg Oral Q breakfast   sodium chloride flush  10-40 mL Intracatheter Q12H   sodium chloride flush  3 mL Intravenous Q12H   vitamin B-12  1,000 mcg Oral Daily   Continuous Infusions: PRN Meds:.acetaminophen **OR** acetaminophen, ALPRAZolam, diclofenac sodium, levalbuterol, polyvinyl alcohol, sodium chloride flush  Antimicrobials: Anti-infectives (From admission, onward)    None        I have personally reviewed the following labs and images: CBC: Recent Labs  Lab  06/18/21 0946 06/18/21 1237 06/19/21 0330 06/20/21 0415  WBC 8.7  --  5.7 8.4  NEUTROABS  --   --   --  7.0  HGB 13.4 13.6 12.5 13.2  HCT 43.8 40.0 39.3 41.2  MCV 105.3*  --  101.8* 99.3  PLT 190  --  193 225   BMP &GFR Recent Labs  Lab 06/18/21 0946 06/18/21 1237 06/19/21 0330 06/20/21 0415  NA 138 140 136 136  K 4.7 4.4 4.9 4.1  CL 101  --  94* 91*  CO2 28  --  37* 39*  GLUCOSE 108*  --  132* 117*  BUN 11  --  9 16  CREATININE 0.86  --  0.91 0.92  CALCIUM 8.9  --  8.6* 9.9   Estimated Creatinine Clearance: 41.8 mL/min (  by C-G formula based on SCr of 0.92 mg/dL). Liver & Pancreas: Recent Labs  Lab 06/19/21 0330  AST 19  ALT 15  ALKPHOS 52  BILITOT 0.4  PROT 6.7  ALBUMIN 2.9*   No results for input(s): LIPASE, AMYLASE in the last 168 hours. No results for input(s): AMMONIA in the last 168 hours. Diabetic: No results for input(s): HGBA1C in the last 72 hours. No results for input(s): GLUCAP in the last 168 hours. Cardiac Enzymes: No results for input(s): CKTOTAL, CKMB, CKMBINDEX, TROPONINI in the last 168 hours. No results for input(s): PROBNP in the last 8760 hours. Coagulation Profile: No results for input(s): INR, PROTIME in the last 168 hours. Thyroid Function Tests: No results for input(s): TSH, T4TOTAL, FREET4, T3FREE, THYROIDAB in the last 72 hours. Lipid Profile: No results for input(s): CHOL, HDL, LDLCALC, TRIG, CHOLHDL, LDLDIRECT in the last 72 hours. Anemia Panel: No results for input(s): VITAMINB12, FOLATE, FERRITIN, TIBC, IRON, RETICCTPCT in the last 72 hours. Urine analysis:    Component Value Date/Time   COLORURINE YELLOW 12/23/2020 0055   APPEARANCEUR CLEAR 12/23/2020 0055   LABSPEC 1.014 12/23/2020 0055   PHURINE 7.0 12/23/2020 0055   GLUCOSEU NEGATIVE 12/23/2020 0055   GLUCOSEU NEGATIVE 12/19/2013 1127   HGBUR NEGATIVE 12/23/2020 0055   BILIRUBINUR NEGATIVE 12/23/2020 0055   KETONESUR NEGATIVE 12/23/2020 0055   PROTEINUR 100 (A)  12/23/2020 0055   UROBILINOGEN 0.2 12/19/2013 1127   NITRITE NEGATIVE 12/23/2020 0055   LEUKOCYTESUR NEGATIVE 12/23/2020 0055   Sepsis Labs: Invalid input(s): PROCALCITONIN, [place]  Microbiology: Recent Results (from the past 240 hour(s))  Resp Panel by RT-PCR (Flu A&B, Covid) Nasopharyngeal Swab     Status: None   Collection Time: 06/18/21  1:51 PM   Specimen: Nasopharyngeal Swab; Nasopharyngeal(NP) swabs in vial transport medium  Result Value Ref Range Status   SARS Coronavirus 2 by RT PCR NEGATIVE NEGATIVE Final    Comment: (NOTE) SARS-CoV-2 target nucleic acids are NOT DETECTED.  The SARS-CoV-2 RNA is generally detectable in upper respiratory specimens during the acute phase of infection. The lowest concentration of SARS-CoV-2 viral copies this assay can detect is 138 copies/mL. A negative result does not preclude SARS-Cov-2 infection and should not be used as the sole basis for treatment or other patient management decisions. A negative result may occur with  improper specimen collection/handling, submission of specimen other than nasopharyngeal swab, presence of viral mutation(s) within the areas targeted by this assay, and inadequate number of viral copies(<138 copies/mL). A negative result must be combined with clinical observations, patient history, and epidemiological information. The expected result is Negative.  Fact Sheet for Patients:  EntrepreneurPulse.com.au  Fact Sheet for Healthcare Providers:  IncredibleEmployment.be  This test is no t yet approved or cleared by the [place] FDA and  has been authorized for detection and/or diagnosis of SARS-CoV-2 by FDA under an Emergency Use Authorization (EUA). This EUA will remain  in effect (meaning this test can be used) for the duration of the COVID-19 declaration under Section 564(b)(1) of the Act, 21 U.S.C.section 360bbb-3(b)(1), unless the authorization is terminated   or revoked sooner.       Influenza A by PCR NEGATIVE NEGATIVE Final   Influenza B by PCR NEGATIVE NEGATIVE Final    Comment: (NOTE) The Xpert Xpress SARS-CoV-2/FLU/RSV plus assay is intended as an aid in the diagnosis of influenza from Nasopharyngeal swab specimens and should not be used as a sole basis for treatment. Nasal washings and aspirates are unacceptable for  Xpert Xpress SARS-CoV-2/FLU/RSV testing.  Fact Sheet for Patients: EntrepreneurPulse.com.au  Fact Sheet for Healthcare Providers: IncredibleEmployment.be  This test is not yet approved or cleared by the [place] FDA and has been authorized for detection and/or diagnosis of SARS-CoV-2 by FDA under an Emergency Use Authorization (EUA). This EUA will remain in effect (meaning this test can be used) for the duration of the COVID-19 declaration under Section 564(b)(1) of the Act, 21 U.S.C. section 360bbb-3(b)(1), unless the authorization is terminated or revoked.  Performed at [facility], [place] [address] [place], [place] [address]     Radiology Studies: No results found.     Floree Zuniga T. Epworth  If 7PM-7AM, please contact night-coverage [email or web address] 06/20/2021, 1:29 PM

## 2021-06-21 ENCOUNTER — Inpatient Hospital Stay (HOSPITAL_COMMUNITY): Payer: Medicare Other

## 2021-06-21 DIAGNOSIS — J441 Chronic obstructive pulmonary disease with (acute) exacerbation: Secondary | ICD-10-CM | POA: Diagnosis not present

## 2021-06-21 DIAGNOSIS — J9621 Acute and chronic respiratory failure with hypoxia: Secondary | ICD-10-CM | POA: Diagnosis not present

## 2021-06-21 DIAGNOSIS — F419 Anxiety disorder, unspecified: Secondary | ICD-10-CM | POA: Diagnosis not present

## 2021-06-21 DIAGNOSIS — I5033 Acute on chronic diastolic (congestive) heart failure: Secondary | ICD-10-CM | POA: Diagnosis not present

## 2021-06-21 LAB — CBC
HCT: 41.2 % (ref 36.0–46.0)
Hemoglobin: 13.5 g/dL (ref 12.0–15.0)
MCH: 31.9 pg (ref 26.0–34.0)
MCHC: 32.8 g/dL (ref 30.0–36.0)
MCV: 97.4 fL (ref 80.0–100.0)
Platelets: 202 10*3/uL (ref 150–400)
RBC: 4.23 MIL/uL (ref 3.87–5.11)
RDW: 12.9 % (ref 11.5–15.5)
WBC: 8.5 10*3/uL (ref 4.0–10.5)
nRBC: 0 % (ref 0.0–0.2)

## 2021-06-21 LAB — BRAIN NATRIURETIC PEPTIDE: B Natriuretic Peptide: 112.7 pg/mL — ABNORMAL HIGH (ref 0.0–100.0)

## 2021-06-21 LAB — RENAL FUNCTION PANEL
Albumin: 2.9 g/dL — ABNORMAL LOW (ref 3.5–5.0)
Anion gap: 9 (ref 5–15)
BUN: 19 mg/dL (ref 8–23)
CO2: 36 mmol/L — ABNORMAL HIGH (ref 22–32)
Calcium: 9.7 mg/dL (ref 8.9–10.3)
Chloride: 89 mmol/L — ABNORMAL LOW (ref 98–111)
Creatinine, Ser: 0.93 mg/dL (ref 0.44–1.00)
GFR, Estimated: >60 mL/min (ref >60)
Glucose, Bld: 101 mg/dL — ABNORMAL HIGH (ref 70–99)
Phosphorus: 3.3 mg/dL (ref 2.5–4.6)
Potassium: 3.9 mmol/L (ref 3.5–5.1)
Sodium: 134 mmol/L — ABNORMAL LOW (ref 135–145)

## 2021-06-21 LAB — MAGNESIUM: Magnesium: 2.1 mg/dL (ref 1.7–2.4)

## 2021-06-21 NOTE — Progress Notes (Signed)
Patient at rest SATs 88% On 3L [place].  Patient ambulating SATs 83% on 10 L [place].

## 2021-06-21 NOTE — Plan of Care (Signed)
Patient progressing 

## 2021-06-21 NOTE — Evaluation (Signed)
Physical Therapy Evaluation and Discharge Patient Details Name: [name] MRN: [id number] DOB: December 16, 1945 Today's Date: 06/21/2021  History of Present Illness  Pt is a 75 y.o. F who presents with progressive SOB with associated congestion, productive cough and wheeze. Admitted with acute on chronic RF with hypoxia and hypercapnia due to COPD exacerbation and possible CHF exacerbation. Significant PMH: diastolic CHF, COPD, chronic RF, CAD, breast CA, anxiety, tobacco use, prior partial colectomy.  Clinical Impression  Patient evaluated by Physical Therapy with no further acute PT needs identified. Prior to admission, pt lives with her daughter, uses a cane intermittently for mobility and is independent with ADL's. Pt presents with decreased cardiopulmonary endurance and mild balance deficits. Ambulating 200 feet with no assistive device at a supervision level. SpO2 ranging from 83-97% on 6L O2 (poor pleth); DOE 2/4. Education provided regarding IS use, pulse oximetry monitoring, activity recommendations. All education has been completed and the patient has no further questions. No follow-up Physical Therapy or equipment needs. PT is signing off. Thank you for this referral.      Recommendations for follow up therapy are one component of a multi-disciplinary discharge planning process, led by the attending physician.  Recommendations may be updated based on patient status, additional functional criteria and insurance authorization.  Follow Up Recommendations No PT follow up;Supervision for mobility/OOB    Equipment Recommendations  None recommended by PT    Recommendations for Other Services       Precautions / Restrictions Precautions Precautions: Other (comment) Precaution Comments: watch O2 Restrictions Weight Bearing Restrictions: No      Mobility  Bed Mobility               General bed mobility comments: OOB standing upon entrance    Transfers Overall transfer  level: Independent Equipment used: None                Ambulation/Gait Ambulation/Gait assistance: Supervision Gait Distance (Feet): 200 Feet Assistive device: None Gait Pattern/deviations: Step-through pattern;Decreased stride length Gait velocity: decreased   General Gait Details: Mild dynamic balance deficits but no overt LOB, pt self cueing for activity pacing  Stairs            Wheelchair Mobility    Modified Rankin (Stroke Patients Only)       Balance Overall balance assessment: Mild deficits observed, not formally tested                                           Pertinent Vitals/Pain Pain Assessment: No/denies pain    Home Living Family/patient expects to be discharged to:: Private residence Living Arrangements: Children (daughter) Available Help at Discharge: Family;Available 24 hours/day Type of Home: House Home Access: Stairs to enter   [employer] of Steps: 1 Home Layout: One level Home Equipment: Cane - single point      Prior Function Level of Independence: Independent with assistive device(s)         Comments: Intermittently uses cane     Hand Dominance   Dominant Hand: Right    Extremity/Trunk Assessment   Upper Extremity Assessment Upper Extremity Assessment: Overall WFL for tasks assessed    Lower Extremity Assessment Lower Extremity Assessment: Overall WFL for tasks assessed    Cervical / Trunk Assessment Cervical / Trunk Assessment: Kyphotic (mildly)  Communication   Communication: No difficulties  Cognition Arousal/Alertness: Awake/alert Behavior During Therapy:  WFL for tasks assessed/performed Overall Cognitive Status: Within Functional Limits for tasks assessed                                        General Comments      Exercises     Assessment/Plan    PT Assessment Patent does not need any further PT services  PT Problem List Decreased strength;Decreased  activity tolerance;Decreased balance;Decreased mobility;Cardiopulmonary status limiting activity       PT Treatment Interventions      PT Goals (Current goals can be found in the Care Plan section)  Acute Rehab PT Goals Patient Stated Goal: return to gardening PT Goal Formulation: All assessment and education complete, DC therapy    Frequency     Barriers to discharge        Co-evaluation               AM-PAC PT "6 Clicks" Mobility  Outcome Measure Help needed turning from your back to your side while in a flat bed without using bedrails?: None Help needed moving from lying on your back to sitting on the side of a flat bed without using bedrails?: None Help needed moving to and from a bed to a chair (including a wheelchair)?: None Help needed standing up from a chair using your arms (e.g., wheelchair or bedside chair)?: None Help needed to walk in hospital room?: A Little Help needed climbing 3-5 steps with a railing? : A Little 6 Click Score: 22    End of Session Equipment Utilized During Treatment: Oxygen Activity Tolerance: Patient tolerated treatment well Patient left: in bed;with call bell/phone within reach;with nursing/sitter in room Nurse Communication: Mobility status PT Visit Diagnosis: Unsteadiness on feet (R26.81);Difficulty in walking, not elsewhere classified (R26.2)    Time: 5449-2010 PT Time Calculation (min) (ACUTE ONLY): 18 min   Charges:   PT Evaluation $PT Eval Low Complexity: [place], PT, DPT Acute Rehabilitation Services Pager [phone] Office [phone]   Deno Etienne 06/21/2021, 9:48 AM

## 2021-06-21 NOTE — Progress Notes (Signed)
PROGRESS NOTE  [name] IOX:[id number] DOB: 29-Aug-1946   PCP: [name], MD  Patient is from: Home.  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  Initial ABG with acute on chronic respiratory acidosis.  CXR raises concern for CHF.  BNP 100.  Started on BiPAP, systemic steroid with breathing treatments and IV Lasix.   Subjective: Seen and examined earlier this morning.  Since she desaturated to 77% on 3 L last night and oxygen was titrated to 5 L.  Patient has no complaint this morning but feels sleepy.  Reports improvement in her breathing.  She denies chest pain, GI or UTI symptoms.  Currently saturating at 92 to 94% on 3 L at rest.   Objective: Vitals:   06/21/21 0400 06/21/21 0721 06/21/21 1200 06/21/21 1438  BP: (!) 155/110 136/62 (!) 109/95   Pulse:  92 (!) 102 88  Resp: 19 (!) 22 (!) 21   Temp:  98.4 F (36.9 C) (!) 97.1 F (36.2 C)   TempSrc:  Oral Oral   SpO2: 94% 97% (!) 88%   Weight:      Height:        Intake/Output Summary (Last 24 hours) at 06/21/2021 1524 Last data filed at 06/21/2021 0300 Gross per 24 hour  Intake --  Output 500 ml  Net -500 ml   Filed Weights   06/19/21 0356 06/20/21 0351 06/21/21 0012  Weight: 67.5 kg 52.5 kg 54 kg    Examination:  GENERAL: No apparent distress.  Nontoxic. HEENT: MMM.  Vision and hearing grossly intact.  NECK: Supple.  No apparent JVD.  RESP: 92 to 94% on 3 L at rest.  No  IWOB.  Mild end expiratory wheeze. CVS:  RRR. Heart sounds normal.  ABD/GI/GU: BS+. Abd soft, NTND.  MSK/EXT:  Moves extremities. No apparent deformity. No edema.  SKIN: no apparent skin lesion or wound NEURO: Sleepy but wakes to voice easily.  Oriented x4.  No apparent focal neuro deficit. PSYCH: Calm. Normal affect.    Procedures:  None  Microbiology summarized: QASTM-19 and influenza PCR nonreactive.  Assessment & Plan: Acute on chronic RF with hypoxia and hypercapnia-most likely due to COPD exacerbation with some degree of CHF exacerbation.  COPD exacerbation likely due to ongoing tobacco abuse, noncompliance and benzodiazepine use.  Initial ABG 7.25/90/54/42 in line with COPD.  Initially started on BiPAP but did not want to continue.  Respiratory status improved but significant desaturation to low 80s with ambulation. -Continue systemic steroid, Z-Pak and home Singulair. -Change nebulizers to scheduled Xopenex and Atrovent in the setting of bigeminy -Minimum oxygen to keep saturation above 88%.  Discussed with patient and RN. -Encouraged to quit smoking cigarettes, which is likely the most important -Incentive spirometry/OOB/PT/OT -Needs triple therapy but she says Trelegy Ellipta did not work for her -Patient prefers to remain full code except for BiPAP -Repeat chest x-ray  Chronic diastolic CHF: TTE in 6/22 with LVEF of 55 to 60%, G2-DD.  BNP  100 (lower than baseline).  Appears euvolemic on exam but after IV diuretics.  CXR consistent with mild CHF.  1.8 L UOP/24 hours on IV Lasix.  Renal function stable.  However, significant oxygen requirement -Continue IV Lasix 40 mg daily -Closely monitor fluid status, renal functions and electrolytes -Repeat CXR   Anxiety: Stable. -Adamant about her home dose Xanax of 1 mg 3 times daily despite risk of respiratory distress -She is also on significant dose of nortriptyline.  Vitamin B12 deficiency -Continue replacement    Hyperlipidemia: Continue statin.   History of breast cancer right arm lymphedema: lumpectomy in 2008 and since that time patient reports having visited a right arm lymphedema that is unchanged.   Tobacco abuse: Smokes about 7 to 10 cigarettes a day. -Strongly encourage cessation   GERD:  -PPI   Generalized weakness/physical deconditioning -PT/OT  Body mass index is 21.77 kg/m.         DVT prophylaxis:  enoxaparin (LOVENOX) injection 40 mg Start: 06/18/21 1345  Code Status: Full code except for BiPAP Family Communication: Patient and/or RN. Available if any question.  Level of care: Progressive Status is: Inpatient  Remains inpatient appropriate because:IV treatments appropriate due to intensity of illness or inability to take PO and Inpatient level of care appropriate due to severity of illness  Dispo: The patient is from: Home              Anticipated d/c is to: Home              Patient currently is not medically stable to d/c.   Difficult to place patient No       Consultants:  None   Sch Meds:  Scheduled Meds:  azithromycin  250 mg Oral Daily   budesonide (PULMICORT) nebulizer solution  0.5 mg Nebulization BID   enoxaparin (LOVENOX) injection  40 mg Subcutaneous Q24H   fluticasone  1 spray Each Nare Daily   furosemide  40 mg Intravenous Daily   ipratropium  0.5 mg Nebulization Q6H   levalbuterol  0.63 mg Nebulization Q6H   linaclotide  72 mcg Oral QAC breakfast   montelukast  10 mg Oral QHS   nortriptyline  50 mg Oral QHS   pantoprazole  40 mg Oral Daily   pravastatin  40 mg Oral q1800   predniSONE  40 mg Oral Q breakfast   sodium chloride flush  10-40 mL Intracatheter Q12H   sodium chloride flush  3 mL Intravenous Q12H   vitamin B-12  1,000 mcg Oral Daily   Continuous Infusions: PRN Meds:.acetaminophen **OR** acetaminophen, ALPRAZolam, diclofenac sodium, levalbuterol, polyvinyl alcohol, sodium chloride flush  Antimicrobials: Anti-infectives  (From admission, onward)    Start     Dose/Rate Route Frequency Ordered Stop   06/21/21 1000  azithromycin (ZITHROMAX) tablet 250 mg       See Hyperspace for full Linked Orders Report.   250 mg Oral Daily 06/20/21 1347 06/25/21 0959   06/20/21 1445  azithromycin (ZITHROMAX) tablet 500 mg       See Hyperspace for full Linked Orders Report.   500 mg Oral Daily 06/20/21 1347 06/20/21 1405        I have personally reviewed the following labs and images: CBC: Recent Labs  Lab 06/18/21 0946 06/18/21 1237 06/19/21 0330 06/20/21 0415 06/21/21 0531  WBC 8.7  --  5.7 8.4 8.5  NEUTROABS  --   --   --  7.0  --   HGB 13.4 13.6 12.5 13.2 13.5  HCT 43.8 40.0 39.3 41.2 41.2  MCV 105.3*  --  101.8* 99.3 97.4  PLT 190  --  193 225 202   BMP &GFR Recent Labs  Lab 06/18/21 0946 06/18/21 1237 06/19/21 0330 06/20/21 0415 06/21/21 0531  NA 138 140 136 136 134*  K 4.7 4.4 4.9 4.1 3.9  CL 101  --  94* 91* 89*  CO2 28  --  37* 39* 36*  GLUCOSE 108*  --  132* 117* 101*  BUN 11  --  9 16 19   CREATININE 0.86  --  0.91 0.92 0.93  CALCIUM 8.9  --  8.6* 9.9 9.7  MG  --   --   --   --  2.1  PHOS  --   --   --   --  3.3   Estimated Creatinine Clearance: 41.3 mL/min (by C-G formula based on SCr of 0.93 mg/dL). Liver & Pancreas: Recent Labs  Lab 06/19/21 0330 06/21/21 0531  AST 19  --   ALT 15  --   ALKPHOS 52  --   BILITOT 0.4  --   PROT 6.7  --   ALBUMIN 2.9* 2.9*   No results for input(s): LIPASE, AMYLASE in the last 168 hours. No results for input(s): AMMONIA in the last 168 hours. Diabetic: No results for input(s): HGBA1C in the last 72 hours. No results for input(s): GLUCAP in the last 168 hours. Cardiac Enzymes: No results for input(s): CKTOTAL, CKMB, CKMBINDEX, TROPONINI in the last 168 hours. No results for input(s): PROBNP in the last 8760 hours. Coagulation Profile: No results for input(s): INR, PROTIME in the last 168 hours. Thyroid Function Tests: No results for  input(s): TSH, T4TOTAL, FREET4, T3FREE, THYROIDAB in the last 72 hours. Lipid Profile: No results for input(s): CHOL, HDL, LDLCALC, TRIG, CHOLHDL, LDLDIRECT in the last 72 hours. Anemia Panel: No results for input(s): VITAMINB12, FOLATE, FERRITIN, TIBC, IRON, RETICCTPCT in the last 72 hours. Urine analysis:    Component Value Date/Time   COLORURINE YELLOW 12/23/2020 0055   APPEARANCEUR CLEAR 12/23/2020 0055   LABSPEC 1.014 12/23/2020 0055   PHURINE 7.0 12/23/2020 0055   GLUCOSEU NEGATIVE 12/23/2020 0055   GLUCOSEU NEGATIVE 12/19/2013 1127   HGBUR NEGATIVE 12/23/2020 0055   BILIRUBINUR NEGATIVE 12/23/2020 0055   KETONESUR NEGATIVE 12/23/2020 0055   PROTEINUR 100 (A) 12/23/2020 0055   UROBILINOGEN 0.2 12/19/2013 1127   NITRITE NEGATIVE 12/23/2020 0055   LEUKOCYTESUR NEGATIVE 12/23/2020 0055   Sepsis Labs: Invalid input(s): PROCALCITONIN, [place]  Microbiology: Recent Results (from the past 240 hour(s))  Resp Panel by RT-PCR (Flu A&B, Covid) Nasopharyngeal Swab     Status: None   Collection Time: 06/18/21  1:51 PM   Specimen: Nasopharyngeal Swab; Nasopharyngeal(NP) swabs in vial transport medium  Result Value Ref Range Status   SARS Coronavirus 2 by RT PCR NEGATIVE NEGATIVE Final    Comment: (NOTE) SARS-CoV-2 target nucleic acids are NOT DETECTED.  The SARS-CoV-2 RNA is generally detectable in upper respiratory specimens during the acute phase of infection. The lowest concentration of SARS-CoV-2 viral copies this assay can detect is 138 copies/mL. A negative result does not preclude SARS-Cov-2 infection and should not be used as the sole basis for treatment or other patient management decisions. A negative result may occur with  improper specimen collection/handling, submission of specimen other than nasopharyngeal swab, presence of viral mutation(s) within the areas targeted by this assay, and inadequate number of viral copies(<138 copies/mL). A negative result must be  combined  with clinical observations, patient history, and epidemiological information. The expected result is Negative.  Fact Sheet for Patients:  EntrepreneurPulse.com.au  Fact Sheet for Healthcare Providers:  IncredibleEmployment.be  This test is no t yet approved or cleared by the [place] FDA and  has been authorized for detection and/or diagnosis of SARS-CoV-2 by FDA under an Emergency Use Authorization (EUA). This EUA will remain  in effect (meaning this test can be used) for the duration of the COVID-19 declaration under Section 564(b)(1) of the Act, 21 U.S.C.section 360bbb-3(b)(1), unless the authorization is terminated  or revoked sooner.       Influenza A by PCR NEGATIVE NEGATIVE Final   Influenza B by PCR NEGATIVE NEGATIVE Final    Comment: (NOTE) The Xpert Xpress SARS-CoV-2/FLU/RSV plus assay is intended as an aid in the diagnosis of influenza from Nasopharyngeal swab specimens and should not be used as a sole basis for treatment. Nasal washings and aspirates are unacceptable for Xpert Xpress SARS-CoV-2/FLU/RSV testing.  Fact Sheet for Patients: EntrepreneurPulse.com.au  Fact Sheet for Healthcare Providers: IncredibleEmployment.be  This test is not yet approved or cleared by the [place] FDA and has been authorized for detection and/or diagnosis of SARS-CoV-2 by FDA under an Emergency Use Authorization (EUA). This EUA will remain in effect (meaning this test can be used) for the duration of the COVID-19 declaration under Section 564(b)(1) of the Act, 21 U.S.C. section 360bbb-3(b)(1), unless the authorization is terminated or revoked.  Performed at [facility], [place] [address], [place], [place] [address]     Radiology Studies: No results found.     Tyshaun Vinzant T. [place]  If 7PM-7AM, please contact night-coverage [email or web address] 06/21/2021, 3:24 PM

## 2021-06-22 ENCOUNTER — Other Ambulatory Visit: Payer: Self-pay

## 2021-06-22 DIAGNOSIS — Z9981 Dependence on supplemental oxygen: Secondary | ICD-10-CM

## 2021-06-22 DIAGNOSIS — F419 Anxiety disorder, unspecified: Secondary | ICD-10-CM | POA: Diagnosis not present

## 2021-06-22 DIAGNOSIS — R5381 Other malaise: Secondary | ICD-10-CM

## 2021-06-22 DIAGNOSIS — J441 Chronic obstructive pulmonary disease with (acute) exacerbation: Secondary | ICD-10-CM

## 2021-06-22 DIAGNOSIS — E782 Mixed hyperlipidemia: Secondary | ICD-10-CM | POA: Diagnosis not present

## 2021-06-22 DIAGNOSIS — I5032 Chronic diastolic (congestive) heart failure: Secondary | ICD-10-CM

## 2021-06-22 DIAGNOSIS — J9621 Acute and chronic respiratory failure with hypoxia: Secondary | ICD-10-CM | POA: Diagnosis not present

## 2021-06-22 LAB — RENAL FUNCTION PANEL
Albumin: 2.5 g/dL — ABNORMAL LOW (ref 3.5–5.0)
Anion gap: 7 (ref 5–15)
BUN: 19 mg/dL (ref 8–23)
CO2: 36 mmol/L — ABNORMAL HIGH (ref 22–32)
Calcium: 8.9 mg/dL (ref 8.9–10.3)
Chloride: 92 mmol/L — ABNORMAL LOW (ref 98–111)
Creatinine, Ser: 0.86 mg/dL (ref 0.44–1.00)
GFR, Estimated: >60 mL/min (ref >60)
Glucose, Bld: 90 mg/dL (ref 70–99)
Phosphorus: 2.9 mg/dL (ref 2.5–4.6)
Potassium: 3.6 mmol/L (ref 3.5–5.1)
Sodium: 135 mmol/L (ref 135–145)

## 2021-06-22 LAB — BRAIN NATRIURETIC PEPTIDE: B Natriuretic Peptide: 50.9 pg/mL (ref 0.0–100.0)

## 2021-06-22 LAB — CBC
HCT: 38.6 % (ref 36.0–46.0)
Hemoglobin: 12.6 g/dL (ref 12.0–15.0)
MCH: 32.3 pg (ref 26.0–34.0)
MCHC: 32.6 g/dL (ref 30.0–36.0)
MCV: 99 fL (ref 80.0–100.0)
Platelets: 196 10*3/uL (ref 150–400)
RBC: 3.9 MIL/uL (ref 3.87–5.11)
RDW: 13 % (ref 11.5–15.5)
WBC: 8.2 10*3/uL (ref 4.0–10.5)
nRBC: 0 % (ref 0.0–0.2)

## 2021-06-22 LAB — MAGNESIUM: Magnesium: 2 mg/dL (ref 1.7–2.4)

## 2021-06-22 MED ORDER — PREDNISONE 20 MG PO TABS: 40.0000 mg | ORAL_TABLET | Freq: Every day | ORAL | 0 refills | Status: DC

## 2021-06-22 MED ORDER — AZITHROMYCIN 250 MG PO TABS: 250.0000 mg | ORAL_TABLET | Freq: Every day | ORAL | 0 refills | Status: DC

## 2021-06-22 NOTE — Evaluation (Signed)
Occupational Therapy Evaluation/Discharge Patient Details Name: [name] MRN: [id number] DOB: [birth year] Today's Date: 06/22/2021   History of Present Illness Pt is a 75 y.o. F who presents with progressive SOB with associated congestion, productive cough and wheeze. Admitted with acute on chronic RF with hypoxia and hypercapnia due to COPD exacerbation and possible CHF exacerbation. Significant PMH: diastolic CHF, COPD, chronic RF, CAD, breast CA, anxiety, tobacco use, prior partial colectomy.   Clinical Impression   PTA, pt lives with daughter and reports Modified Independence with ADLs and mobility using cane. Pt presents now with cardiopulmonary deficits. Pt overall Independent with ADLs and distant Supervision for hallway mobility without AD to ensure safety due to desats. Pt with questionable desats to 79-83% on 4 L O2 during mobility though pt believes the desats are due to wearing a face mask. Pt also noted to remove O2 with entry back into room. Educated on use of pulse ox to monitor O2 levels at home with pt asking, "Why would I do that when I can tell when I'm out of breath?". Provided energy conservation education with handout for pt to review. Anticipate no OT needs at acute level or on DC.       Recommendations for follow up therapy are one component of a multi-disciplinary discharge planning process, led by the attending physician.  Recommendations may be updated based on patient status, additional functional criteria and insurance authorization.   Follow Up Recommendations  No OT follow up    Equipment Recommendations  None recommended by OT    Recommendations for Other Services       Precautions / Restrictions Precautions Precautions: Other (comment) Precaution Comments: watch O2 Restrictions Weight Bearing Restrictions: No      Mobility Bed Mobility Overal bed mobility: Independent                  Transfers Overall transfer level:  Independent Equipment used: None                  Balance Overall balance assessment: Mild deficits observed, not formally tested                                         ADL either performed or assessed with clinical judgement   ADL Overall ADL's : Independent                                       General ADL Comments: able to don socks without assist, ambulate in room and in hallway without AD, has been using BSC without assist     Vision Baseline Vision/History: 0 No visual deficits Ability to See in Adequate Light: 0 Adequate Patient Visual Report: No change from baseline Vision Assessment?: No apparent visual deficits     Perception     Praxis      Pertinent Vitals/Pain Pain Assessment: No/denies pain     Hand Dominance Right   Extremity/Trunk Assessment Upper Extremity Assessment Upper Extremity Assessment: Overall WFL for tasks assessed   Lower Extremity Assessment Lower Extremity Assessment: Defer to PT evaluation   Cervical / Trunk Assessment Cervical / Trunk Assessment: Kyphotic   Communication Communication Communication: No difficulties   Cognition Arousal/Alertness: Awake/alert Behavior During Therapy: WFL for tasks assessed/performed Overall Cognitive Status: Within Functional Limits for tasks assessed  General Comments: WFL cognitively but decreased awareness of deficits, noncompliant with education on SpO2, need for pulse ox monitoring at home due to safety concerns   General Comments  SpO2 with questionable desats to 79-83% on 4 L O2. Pt blames desats on wearing mask in hallway    Exercises     Shoulder Instructions      Home Living Family/patient expects to be discharged to:: Private residence Living Arrangements: Children;Other relatives (grandchildren (teenagers)) Available Help at Discharge: Family;Available 24 hours/day Type of Home: House Home  Access: Stairs to enter [employer] of Steps: 1   Home Layout: One level     Bathroom Shower/Tub: Teacher, early years/pre: Standard     Home Equipment: [employer] - single point          Prior Functioning/Environment Level of Independence: Independent with assistive device(s)        Comments: Intermittently uses cane, Independent with ADLs, IADLs        OT Problem List: Cardiopulmonary status limiting activity      OT Treatment/Interventions:      OT Goals(Current goals can be found in the care plan section) Acute Rehab OT Goals Patient Stated Goal: return to gardening OT Goal Formulation: All assessment and education complete, DC therapy  OT Frequency:     Barriers to D/C:            Co-evaluation              AM-PAC OT "6 Clicks" Daily Activity     Outcome Measure Help from another person eating meals?: None Help from another person taking care of personal grooming?: None Help from another person toileting, which includes using toliet, bedpan, or urinal?: None Help from another person bathing (including washing, rinsing, drying)?: None Help from another person to put on and taking off regular upper body clothing?: None Help from another person to put on and taking off regular lower body clothing?: None 6 Click Score: 24   End of Session Equipment Utilized During Treatment: Oxygen Nurse Communication: Mobility status  Activity Tolerance: Patient tolerated treatment well Patient left: in bed;with call bell/phone within reach  OT Visit Diagnosis: Muscle weakness (generalized) (M62.81)                Time: 5449-2010 OT Time Calculation (min): 15 min Charges:  OT General Charges $OT Visit: 1 Visit OT Evaluation $OT Eval Low Complexity: 1 Low  Malachy Chamber, OTR/L Acute Rehab Services Office: [phone]   [name] 06/22/2021, 11:04 AM

## 2021-06-22 NOTE — TOC Transition Note (Addendum)
Transition of Care [facility]) - CM/SW Discharge Note   Patient Details  Name: [name] MRN: [id number] Date of Birth: 03-17-1946  [facility] CM/SW Contact:  Zenon Mayo, RN Phone Number: 06/22/2021, 10:33 AM   Clinical Narrative:    Patient lives with daughter, she has neb machine, home oxygen with [employer] 3 liters at night, and a cane.  She has PCP, she has no issues with medications or transportation to [facility] apts.  NCM spoke with Coralyn Mark at [facility], she states patient has a portable oxygen tank at home that she uses.  She does not need any new oxygen orders.  Patient states her her daughter will bring her oxygen when she comes to transport he home at 4:30 today.    Final next level of care: Home/Self Care Barriers to Discharge: No Barriers Identified   Patient Goals and CMS Choice Patient states their goals for this hospitalization and ongoing recovery are:: return home   Choice offered to / list presented to : NA  Discharge Placement                       Discharge Plan and Services   Discharge Planning Services: CM Consult Post Acute Care Choice: NA            DME Agency: NA       HH Arranged: NA          Social Determinants of Health (SDOH) Interventions     Readmission Risk Interventions Readmission Risk Prevention Plan 06/22/2021  Transportation Screening Complete  PCP or Specialist Appt within 3-5 Days Complete  HRI or [place] Complete  Social Work Consult for [place] Planning/Counseling Complete  Palliative Care Screening Not Applicable  Medication Review Press photographer) Complete  Some recent data might be hidden

## 2021-06-22 NOTE — Progress Notes (Signed)
OT Cancellation Note  Patient Details Name: [name] MRN: [id number] DOB: 1946-09-18   Cancelled Treatment:    Reason Eval/Treat Not Completed: Patient declined, no reason specified Pt reports "It's too early for all of this" and requests OT to come back after breakfast  [name] 06/22/2021, 7:06 AM

## 2021-06-22 NOTE — Discharge Summary (Signed)
Physician Discharge Summary  FARRIN SHADLE FUX:[id number] DOB: 16-Dec-1945 DOA: 06/18/2021  PCP: [name], MD  Admit date: 06/18/2021 Discharge date: 06/22/2021 Admitted From: Home Disposition: Home Recommendations for Outpatient Follow-up:  Follow ups as below. Please obtain CBC/BMP/Mag at follow up Please follow up on the following pending results: None Home Health: Not indicated Equipment/Devices: Patient has home oxygen Discharge Condition: Stable CODE STATUS: Full code except BiPAP  Follow-up Information     [name], MD. Schedule an appointment as soon as possible for a visit on 07/01/2021.   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  Initial ABG with acute on chronic respiratory acidosis.  CXR raises concern for CHF.  BNP 100.  Started on BiPAP, systemic steroid with breathing treatments and IV Lasix.   Patient came off BiPAP and improved with systemic steroid, antibiotics, breathing treatments and IV Lasix. On the day of discharge, ambulated on room air and desaturated to 82% but recovered to 92% on 2 L by [place].  She is discharged on p.o. prednisone and azithromycin for 3 more days.  She will continue home breathing treatments and home Lasix.  Strongly encouraged to quit smoking cigarettes.  No needles identified by therapy.  See individual problem list below for more on hospital course.  Discharge Diagnoses:  Acute  on chronic RF with hypoxia and hypercapnia-most likely due to COPD exacerbation with some degree of CHF exacerbation.  COPD exacerbation likely due to ongoing tobacco abuse, noncompliance and benzodiazepine use.  Initial ABG 7.25/90/54/42 in line with COPD.  Initially started on BiPAP but did not want to continue.  Respiratory status improved and discharged on 2 L by nasal cannula.  -Continue prednisone and azithromycin for 3 more days -Advised to adjust her oxygen to keep saturation between 88 and 92%. -Strongly encouraged to quit smoking cigarettes -Continue home Symbicort, Atrovent and DuoNeb.  She states Trelegy Ellipta did not work for her -Outpatient follow-up with PCP and pulmonology   Chronic diastolic CHF: TTE in 1/51 with LVEF of 55 to 60%, G2-DD.  BNP 100 (lower than baseline).  Appears euvolemic on exam but after IV diuretics.  CXR consistent with mild CHF.  Net -4 L.  Renal function stable.  -Discharged on home p.o. Lasix 40 mg twice daily   Anxiety: Stable. -Adamant about her home dose Xanax of 1 mg 3 times daily despite risk of respiratory distress -She is also on significant dose of nortriptyline.   Vitamin B12 deficiency -Continue replacement   Hyperlipidemia: Continue statin.   History of breast cancer right arm lymphedema: lumpectomy in 2008 and since that time patient reports having visited a right arm lymphedema that is unchanged.   Tobacco abuse: Smokes about 7 to 10 cigarettes a day. -Strongly encourage cessation   GERD:  -PPI   Generalized weakness/physical deconditioning-likely from advanced COPD -No PT/OT need identified. Body mass index is 21.53 kg/m.  Discharge Exam: Vitals:   06/22/21 0328 06/22/21 0433 06/22/21 0734 06/22/21 1409  BP:      Pulse: 84     Temp:  98.1 F (36.7 C)    Resp: (!) 24     Height:      Weight:  53.4 kg    SpO2:   93% 98%  TempSrc:  Oral    BMI (Calculated):  21.53       GENERAL: No apparent distress.   Nontoxic. HEENT: MMM.  Vision and hearing grossly intact.  NECK: Supple.  No apparent JVD.  RESP: 98% on 2 L at rest.  No IWOB.  Fair aeration bilaterally. CVS:  RRR. Heart sounds normal.  ABD/GI/GU: Bowel sounds present. Soft. Non tender.  MSK/EXT:  Moves extremities. No apparent deformity. No edema.  SKIN: no apparent skin lesion or wound NEURO: Awake and alert.  Oriented appropriately.  No apparent focal neuro deficit. PSYCH: Calm. Normal affect.   Discharge Instructions  Discharge Instructions     (HEART FAILURE PATIENTS) Call MD:  Anytime you have any of the following symptoms: 1) 3 pound weight gain in 24 hours or 5 pounds in 1 week 2) shortness of breath, with or without a dry hacking cough 3) swelling in the hands, feet or stomach 4) if you have to sleep on extra pillows at night in order to breathe.   Complete by: As directed    Call MD for:  difficulty breathing, headache or visual disturbances   Complete by: As directed    Call MD for:  extreme fatigue   Complete by: As directed    Call MD for:  persistant dizziness or light-headedness   Complete by: As directed    Diet - low sodium heart healthy   Complete by: As directed    Discharge instructions   Complete by: As directed    It has been a pleasure taking care of you!  You were hospitalized due to COPD exacerbation.  Your symptoms improved with treatment.  We are discharging you on more steroid, antibiotics and breathing treatments.  It is very important that you quit smoking cigarettes.  Note that Xanax, Pamelor and Fioricet combined could suppress your breathing and cause problem that can even lead to death.  Fioricet also got caffeine in it which could cause potentially dangerous abnormal heart rhythm.  We strongly recommend you talk to your prescriber about these 2 medications.   In regards to the oxygen, use minimum oxygen to keep your oxygen level above 88%.  Your goal oxygen level is 88 to 92%.  Please review your  new medication list and the directions on your medications before you take them.  Please follow-up with your primary care doctor in 1 to 2 weeks or sooner if needed.  We also recommend you follow-up with your pulmonologist in 2 to 3 weeks or sooner if you can.   It is important that you quit smoking cigarettes.  You may use nicotine patch to help you quit smoking.  Nicotine patch is available over-the-counter.  You may also discuss other options to help you quit smoking with your primary care doctor. You can also talk to professional counselors at 1-800-QUIT-NOW 5053305485) for free smoking cessation counseling.     Take care,   Increase activity slowly   Complete by: As directed       Allergies as of 06/22/2021       Reactions   Ceftriaxone Anaphylaxis, Other (See Comments)   *ROCEPHIN*  "  Blow up like a balloon"   Hydroxyzine Shortness Of Breath, Other (See Comments)   Pt states med make her light headed, get sob sxs   Doxycycline Nausea And Vomiting   Lexapro [escitalopram] Other (See Comments)   Pt states med make her dizzy        Medication List     STOP taking these medications    aspirin 325 MG tablet   aspirin-acetaminophen-caffeine 250-250-65 MG tablet Commonly known as: EXCEDRIN MIGRAINE       TAKE these medications    ALPRAZolam 1 MG tablet Commonly known as: XANAX TAKE 1 TABLET BY MOUTH THREE TIMES A DAY AS NEEDED What changed: reasons to take this   Atrovent HFA 17 MCG/ACT inhaler Generic drug: ipratropium USE 2 PUFFS EVERY FOUR HOURS AS NEEDED What changed:  how much to take how to take this when to take this reasons to take this additional instructions   azithromycin 250 MG tablet Commonly known as: ZITHROMAX Take 1 tablet (250 mg total) by mouth daily for 3 days.   budesonide-formoterol 160-4.5 MCG/ACT inhaler Commonly known as: Symbicort Inhale 2 puffs into the lungs in the morning and at bedtime.    butalbital-acetaminophen-caffeine 50-325-40 MG tablet Commonly known as: FIORICET Take 1 tablet by mouth every 4 (four) hours as needed for headache.   cholecalciferol 1000 units tablet Commonly known as: VITAMIN D Take 1,000 Units by mouth daily.   diclofenac sodium 1 % Gel Commonly known as: VOLTAREN Apply 4 g topically as needed (for pain).   Enulose 10 GM/15ML Soln Generic drug: lactulose (encephalopathy) TAKE 45 MILLILITERS BY MOUTH 3 TIMES A DAY AS NEEDED FOR CONSTIPATION What changed: See the new instructions.   fluticasone 50 MCG/ACT nasal spray Commonly known as: FLONASE USE 2 SPRAYS INTO EACH NOSTRIL ONCE DAILY**REPEAT FOR 5 DAYS THEN STOP What changed: See the new instructions.   furosemide 40 MG tablet Commonly known as: LASIX TAKE 1 TABLET BY MOUTH TWICE A DAY What changed: when to take this   ipratropium-albuterol 0.5-2.5 (3) MG/3ML Soln Commonly known as: DUONEB Inhale 3 mLs into the lungs every 6 (six) hours as needed (for SOB, wheeze or cough).   levocetirizine 5 MG tablet Commonly known as: XYZAL TAKE 1 TABLET BY MOUTH EVERY DAY IN THE EVENING What changed:  how much to take how to take this   linaclotide 72 MCG capsule Commonly known as: Linzess Take 1 capsule (72 mcg total) by mouth daily before breakfast.   Livalo 2 MG Tabs Generic drug: Pitavastatin Calcium Take 1 tablet (2 mg total) by mouth daily.   montelukast 10 MG tablet Commonly known as: SINGULAIR TAKE 1 TABLET BY MOUTH EVERYDAY AT BEDTIME What changed: See the new instructions.   nortriptyline 25 MG capsule Commonly known as: PAMELOR Take 2 capsules (50 mg total) by mouth at bedtime.   Omega 3 1200 MG Caps Take 1,200 mg by mouth every morning.   OXYGEN Inhale 3 L into the lungs See admin instructions. Uses when needed during the day, uses continuous throughout the night   pantoprazole 40 MG tablet Commonly known as: PROTONIX TAKE 1 TABLET BY MOUTH EVERY DAY   polyvinyl  alcohol 1.4 % ophthalmic solution Commonly known as: LIQUIFILM TEARS Place 1 drop into both eyes as needed for dry eyes.   potassium chloride SA 20 MEQ tablet Commonly known as: Klor-Con M20 Take 1 tablet (20 mEq total) by mouth daily.   predniSONE 20 MG tablet Commonly known as: DELTASONE Take  2 tablets (40 mg total) by mouth daily with breakfast.   [name] 60 MG/ML Sosy injection Generic drug: denosumab Inject 60 mg into the skin every 6 (six) months.   Ventolin HFA 108 (90 Base) MCG/ACT inhaler Generic drug: albuterol INHALE 1-2 PUFFS BY MOUTH EVERY 6 HOURS AS NEEDED FOR WHEEZE OR SHORTNESS OF BREATH What changed: See the new instructions.   vitamin B-12 1000 MCG tablet Commonly known as: CYANOCOBALAMIN Take 1 tablet (1,000 mcg total) by mouth daily.   vitamin C 500 MG tablet Commonly known as: ASCORBIC ACID Take 500 mg by mouth daily.        Consultations: None  Procedures/Studies:   DG Chest 2 View  Result Date: 06/21/2021 CLINICAL DATA:  COPD EXAM: CHEST - 2 VIEW COMPARISON:  June 18, 2021, January 15, 2021 FINDINGS: The cardiomediastinal silhouette is unchanged in contour.Unchanged prominence of bilateral hila. Atherosclerotic calcifications. Asymmetric RIGHT apical pleural thickening, similar in comparison to prior. No pleural effusion. No pneumothorax. Diffuse interstitial prominence and peribronchial cuffing, similar in comparison to prior. Surgical clips project over the RIGHT axilla. Visualized abdomen is unremarkable. IMPRESSION: Similar appearance of peribronchial cuffing and diffuse interstitial prominence which could reflect underlying bronchitis and small airways disease. Given underlying COPD, recommend evaluation for candidacy for lung cancer screening CT. Electronically Signed   By: Valentino Saxon M.D.   On: 06/21/2021 18:29   DG Chest Port 1 View  Result Date: 06/18/2021 CLINICAL DATA:  dyspnea EXAM: PORTABLE CHEST 1 VIEW COMPARISON:  April 2022  FINDINGS: The heart appears enlarged. No pleural effusion. No pneumothorax. Slightly increased interstitial markings without lobar consolidation. Aortic calcifications. Surgical clips overlying the right axilla. IMPRESSION: Cardiomegaly with slightly increased interstitial markings suggestive of mild pulmonary edema. No large effusion or lobar consolidation. Electronically Signed   By: Albin Felling M.D.   On: 06/18/2021 09:47       The results of significant diagnostics from this hospitalization (including imaging, microbiology, ancillary and laboratory) are listed below for reference.     Microbiology: Recent Results (from the past 240 hour(s))  Resp Panel by RT-PCR (Flu A&B, Covid) Nasopharyngeal Swab     Status: None   Collection Time: 06/18/21  1:51 PM   Specimen: Nasopharyngeal Swab; Nasopharyngeal(NP) swabs in vial transport medium  Result Value Ref Range Status   SARS Coronavirus 2 by RT PCR NEGATIVE NEGATIVE Final    Comment: (NOTE) SARS-CoV-2 target nucleic acids are NOT DETECTED.  The SARS-CoV-2 RNA is generally detectable in upper respiratory specimens during the acute phase of infection. The lowest concentration of SARS-CoV-2 viral copies this assay can detect is 138 copies/mL. A negative result does not preclude SARS-Cov-2 infection and should not be used as the sole basis for treatment or other patient management decisions. A negative result may occur with  improper specimen collection/handling, submission of specimen other than nasopharyngeal swab, presence of viral mutation(s) within the areas targeted by this assay, and inadequate number of viral copies(<138 copies/mL). A negative result must be combined with clinical observations, patient history, and epidemiological information. The expected result is Negative.  Fact Sheet for Patients:  EntrepreneurPulse.com.au  Fact Sheet for Healthcare Providers:   IncredibleEmployment.be  This test is no t yet approved or cleared by the [place] FDA and  has been authorized for detection and/or diagnosis of SARS-CoV-2 by FDA under an Emergency Use Authorization (EUA). This EUA will remain  in effect (meaning this test can be used) for the duration of the COVID-19 declaration under Section  564(b)(1) of the Act, 21 U.S.C.section 360bbb-3(b)(1), unless the authorization is terminated  or revoked sooner.       Influenza A by PCR NEGATIVE NEGATIVE Final   Influenza B by PCR NEGATIVE NEGATIVE Final    Comment: (NOTE) The Xpert Xpress SARS-CoV-2/FLU/RSV plus assay is intended as an aid in the diagnosis of influenza from Nasopharyngeal swab specimens and should not be used as a sole basis for treatment. Nasal washings and aspirates are unacceptable for Xpert Xpress SARS-CoV-2/FLU/RSV testing.  Fact Sheet for Patients: EntrepreneurPulse.com.au  Fact Sheet for Healthcare Providers: IncredibleEmployment.be  This test is not yet approved or cleared by the [place] FDA and has been authorized for detection and/or diagnosis of SARS-CoV-2 by FDA under an Emergency Use Authorization (EUA). This EUA will remain in effect (meaning this test can be used) for the duration of the COVID-19 declaration under Section 564(b)(1) of the Act, 21 U.S.C. section 360bbb-3(b)(1), unless the authorization is terminated or revoked.  Performed at [facility], [place] [address], [place], [place] [address]      Labs:  CBC: Recent Labs  Lab 06/18/21 [phone] 06/18/21 1237 06/19/21 0330 06/20/21 0415 06/21/21 0531 06/22/21 0511  WBC 8.7  --  5.7 8.4 8.5 8.2  NEUTROABS  --   --   --  7.0  --   --   HGB 13.4 13.6 12.5 13.2 13.5 12.6  HCT 43.8 40.0 39.3 41.2 41.2 38.6  MCV 105.3*  --  101.8* 99.3 97.4 99.0  PLT 190  --  193 225 202 196   BMP &GFR Recent Labs  Lab 06/18/21 0946 06/18/21 1237  06/19/21 0330 06/20/21 0415 06/21/21 0531 06/22/21 0511  NA 138 140 136 136 134* 135  K 4.7 4.4 4.9 4.1 3.9 3.6  CL 101  --  94* 91* 89* 92*  CO2 28  --  37* 39* 36* 36*  GLUCOSE 108*  --  132* 117* 101* 90  BUN 11  --  9 16 19 19   CREATININE 0.86  --  0.91 0.92 0.93 0.86  CALCIUM 8.9  --  8.6* 9.9 9.7 8.9  MG  --   --   --   --  2.1 2.0  PHOS  --   --   --   --  3.3 2.9   Estimated Creatinine Clearance: 44.7 mL/min (by C-G formula based on SCr of 0.86 mg/dL). Liver & Pancreas: Recent Labs  Lab 06/19/21 0330 06/21/21 0531 06/22/21 0511  AST 19  --   --   ALT 15  --   --   ALKPHOS 52  --   --   BILITOT 0.4  --   --   PROT 6.7  --   --   ALBUMIN 2.9* 2.9* 2.5*   No results for input(s): LIPASE, AMYLASE in the last 168 hours. No results for input(s): AMMONIA in the last 168 hours. Diabetic: No results for input(s): HGBA1C in the last 72 hours. No results for input(s): GLUCAP in the last 168 hours. Cardiac Enzymes: No results for input(s): CKTOTAL, CKMB, CKMBINDEX, TROPONINI in the last 168 hours. No results for input(s): PROBNP in the last 8760 hours. Coagulation Profile: No results for input(s): INR, PROTIME in the last 168 hours. Thyroid Function Tests: No results for input(s): TSH, T4TOTAL, FREET4, T3FREE, THYROIDAB in the last 72 hours. Lipid Profile: No results for input(s): CHOL, HDL, LDLCALC, TRIG, CHOLHDL, LDLDIRECT in the last 72 hours. Anemia Panel: No results for input(s): VITAMINB12, FOLATE,  FERRITIN, TIBC, IRON, RETICCTPCT in the last 72 hours. Urine analysis:    Component Value Date/Time   COLORURINE YELLOW 12/23/2020 0055   APPEARANCEUR CLEAR 12/23/2020 0055   LABSPEC 1.014 12/23/2020 0055   PHURINE 7.0 12/23/2020 0055   GLUCOSEU NEGATIVE 12/23/2020 0055   GLUCOSEU NEGATIVE 12/19/2013 1127   HGBUR NEGATIVE 12/23/2020 0055   BILIRUBINUR NEGATIVE 12/23/2020 0055   KETONESUR NEGATIVE 12/23/2020 0055   PROTEINUR 100 (A) 12/23/2020 0055   UROBILINOGEN  0.2 12/19/2013 1127   NITRITE NEGATIVE 12/23/2020 0055   LEUKOCYTESUR NEGATIVE 12/23/2020 0055   Sepsis Labs: Invalid input(s): PROCALCITONIN, LACTICIDVEN   Time coordinating discharge: 50 minutes  SIGNED:  Mercy Riding, MD  [facility] 06/22/2021, 3:06 PM

## 2021-06-22 NOTE — Care Management Important Message (Signed)
Important Message  Patient Details  Name: [name] MRN: [id number] Date of Birth: 08-23-1946   Medicare Important Message Given:  Yes     Shelda Altes 06/22/2021, 9:58 AM

## 2021-06-22 NOTE — Progress Notes (Addendum)
SATURATION QUALIFICATIONS: (This note is used to comply with regulatory documentation for home oxygen)  Patient Saturations on Room Air at Rest = 88%  Patient Saturations on Room Air while Ambulating = 82%  Patient Saturations on 2 Liters of oxygen while Ambulating = 92%  Please briefly explain why patient needs home oxygen: while resting pts oxygen saturation with purse lip breathing at 88%. While ambulating pt required 2L of oxygen by nasal canula. Oxygen saturation dropped to 82% and when placed on 2L pts oxygen saturation up to 92%. Pt ambulated 100 ft before asking to go back to bed.   Pt required 2L of nasal canula at this time to catch her breath.   Pt is currently on 2L [place] with oxygen saturation at 92% in bed while resting.

## 2021-06-22 NOTE — Plan of Care (Signed)
Patient progressing 

## 2021-06-22 NOTE — Progress Notes (Signed)
Patient SATs 95% on 4L [place] at rest  Patient SATs 83% on 4 L [place] ambulation

## 2021-06-22 NOTE — TOC Initial Note (Signed)
Transition of Care [facility]) - Initial/Assessment Note    Patient Details  Name: [name] MRN: [id number] Date of Birth: 12-02-1945  [facility]) CM/SW Contact:    Zenon Mayo, RN Phone Number: 06/22/2021, 10:31 AM  Clinical Narrative:                 Patient lives with daughter, she has neb machine, home oxygen with [employer] 3 liters at night, and a cane.  She has PCP, she has no issues with medications or transportation to [place] apts.   Expected Discharge Plan: Home/Self Care Barriers to Discharge: No Barriers Identified   Patient Goals and CMS Choice Patient states their goals for this hospitalization and ongoing recovery are:: return home   Choice offered to / list presented to : NA  Expected Discharge Plan and Services Expected Discharge Plan: Home/Self Care   Discharge Planning Services: CM Consult Post Acute Care Choice: NA Living arrangements for the past 2 months: Single Family Home Expected Discharge Date: 06/22/21                 DME Agency: NA       HH Arranged: NA          Prior Living Arrangements/Services Living arrangements for the past 2 months: Single Family Home Lives with:: Adult Children Patient language and need for interpreter reviewed:: Yes Do you feel safe going back to the place where you live?: Yes      Need for Family Participation in Patient Care: Yes (Comment) Care giver support system in place?: Yes (comment)   Criminal Activity/Legal Involvement Pertinent to Current Situation/Hospitalization: No - Comment as needed  Activities of Daily Living Home Assistive Devices/Equipment: Oxygen ADL Screening (condition at time of admission) Patient's cognitive ability adequate to safely complete daily activities?: Yes Is the patient deaf or have difficulty hearing?: No Does the patient have difficulty seeing, even when wearing glasses/contacts?: No Does the patient have difficulty concentrating, remembering, or making  decisions?: No Patient able to express need for assistance with ADLs?: Yes Does the patient have difficulty dressing or bathing?: No Independently performs ADLs?: Yes (appropriate for developmental age) Does the patient have difficulty walking or climbing stairs?: Yes Weakness of Legs: Both Weakness of Arms/Hands: None  Permission Sought/Granted                  Emotional Assessment Appearance:: Appears stated age Attitude/Demeanor/Rapport: Engaged Affect (typically observed): Appropriate Orientation: : Oriented to Place, Oriented to Self, Oriented to  Time, Oriented to Situation Alcohol / Substance Use: Not Applicable    Admission diagnosis:  COPD exacerbation ([place]) [J44.1] Acute hypercapnic respiratory failure ([place]) [J96.02] Acute on chronic respiratory failure with hypoxia and hypercapnia ([facility]) [O67.12, J96.22] Patient Active Problem List   Diagnosis Date Noted   Acute on chronic respiratory failure with hypoxia and hypercapnia ([place]) 06/18/2021   Edema of right upper arm 04/09/2021   Atherosclerosis of aorta ([place]) 01/16/2021   Hyperglycemia 01/15/2021   Vitamin B12 deficiency anemia due to intrinsic factor deficiency 01/15/2021   Iron deficiency 01/15/2021   Hyperparathyroidism ([place]) 11/04/2020   Acute seborrheic dermatitis 10/30/2020   Chronic idiopathic constipation 05/06/2020   Small bowel obstruction ([place]) 04/22/2020   Migraine headache without aura    Chronic renal disease, stage 3, moderately decreased glomerular filtration rate (GFR) between 30-59 mL/min/1.73 square meter 03/20/2019   COPD with acute exacerbation ([place]) 08/24/2017   Chronic respiratory failure with hypoxia ([place]) 08/24/2017   Anxiety 08/24/2017  Insomnia due to anxiety and fear 08/22/2017   Oxygen dependent 08/16/2017   Mixed hyperlipidemia 03/31/2016   Other emphysema ([place]) 06/14/2014   Coronary artery disease involving native coronary artery of native heart without angina pectoris 11/06/2013    HTN (hypertension) 11/06/2013   Breast cancer of upper-outer quadrant of right female breast ([place]) 08/21/2013   Osteoporosis 01/01/2013   Chronic diastolic CHF (congestive heart failure) ([place]) 01/31/2012   Aortic valve stenosis 08/01/2011    Class: Chronic   PCP:  [name], MD Pharmacy:   [id number] - , [place] - [address] [place] [address] Phone: [phone] Fax: [phone]  [facility] [place], [place] [address] [place] [address] Phone: [phone] Fax: [phone]     Social Determinants of Health (SDOH) Interventions    Readmission Risk Interventions Readmission Risk Prevention Plan 06/22/2021  Transportation Screening Complete  PCP or Specialist Appt within 3-5 Days Complete  HRI or [place] Complete  Social Work Consult for [place] Planning/Counseling Complete  Palliative Care Screening Not Applicable  Medication Review Press photographer) Complete  Some recent data might be hidden

## 2021-06-23 ENCOUNTER — Telehealth: Payer: Self-pay

## 2021-06-23 ENCOUNTER — Other Ambulatory Visit: Payer: Self-pay | Admitting: Internal Medicine

## 2021-06-23 DIAGNOSIS — J22 Unspecified acute lower respiratory infection: Secondary | ICD-10-CM | POA: Insufficient documentation

## 2021-06-23 MED ORDER — MOXIFLOXACIN HCL 400 MG PO TABS: 400.0000 mg | ORAL_TABLET | Freq: Every day | ORAL | 0 refills | Status: AC

## 2021-06-23 NOTE — Telephone Encounter (Signed)
Please advise as the pts daughter has stated pt was D/C'd from the hospital with azithromycin (ZITHROMAX) 250 MG tablet as an ABX to take. However, the pt is allergic to this abx and was advised to call her PCP office for a new rx.Pt was advised to stop taking her aspirin. Pts daughter states she needs the Aspirin for her heart and wants to know why pts should stop taking it?

## 2021-06-26 ENCOUNTER — Telehealth: Payer: Self-pay | Admitting: *Deleted

## 2021-06-26 NOTE — Chronic Care Management (AMB) (Signed)
  Chronic Care Management   Outreach Note  06/26/2021 Name: [name] MRN: [id number] DOB: [birth year]  [name] is a 75 y.o. year old female who is a primary care patient of Barbara Lima, MD. I reached out to [name] by phone today in response to a referral sent by Ms. [name] [place]'s primary care provider.  An unsuccessful telephone outreach was attempted today. The patient was referred to the case management team for assistance with care management and care coordination.   Follow Up Plan: A HIPAA compliant phone message was left for the patient providing contact information and requesting a return call. The care management team will reach out to the patient again over the next 7-12 days.  If patient returns call to provider office, please advise to call [place] at [phone].  [place] Management  Direct Dial: [phone]

## 2021-07-01 ENCOUNTER — Ambulatory Visit (INDEPENDENT_AMBULATORY_CARE_PROVIDER_SITE_OTHER): Payer: Medicare Other | Admitting: Internal Medicine

## 2021-07-01 ENCOUNTER — Encounter: Payer: Self-pay | Admitting: Internal Medicine

## 2021-07-01 ENCOUNTER — Other Ambulatory Visit: Payer: Self-pay

## 2021-07-01 ENCOUNTER — Telehealth: Payer: Self-pay | Admitting: Internal Medicine

## 2021-07-01 VITALS — BP 132/80 | HR 70 | Temp 97.6°F | Ht 62.0 in | Wt 114.2 lb

## 2021-07-01 DIAGNOSIS — J441 Chronic obstructive pulmonary disease with (acute) exacerbation: Secondary | ICD-10-CM | POA: Diagnosis not present

## 2021-07-01 DIAGNOSIS — I2510 Atherosclerotic heart disease of native coronary artery without angina pectoris: Secondary | ICD-10-CM | POA: Diagnosis not present

## 2021-07-01 DIAGNOSIS — I5032 Chronic diastolic (congestive) heart failure: Secondary | ICD-10-CM | POA: Diagnosis not present

## 2021-07-01 DIAGNOSIS — J9611 Chronic respiratory failure with hypoxia: Secondary | ICD-10-CM | POA: Diagnosis not present

## 2021-07-01 DIAGNOSIS — Z23 Encounter for immunization: Secondary | ICD-10-CM | POA: Diagnosis not present

## 2021-07-01 NOTE — Telephone Encounter (Signed)
Patient says provider advised her to continue taking prednisone.. patient says she was only given a short supply while in hospital  Please send new rx to pharmacy:  [id number] [place], [place] - [address] Phone:  [phone]  Fax:  [phone]

## 2021-07-01 NOTE — Progress Notes (Signed)
   Subjective:   Patient ID: [name], female    DOB: 03-21-1946, 75 y.o.   MRN: [id number]  HPI The patient is a 75 YO female coming in for hospital follow up (acute on chronic respiratory failure requiring bipap, treated with oxygen and steroids and IV antibiotics with recovery during stay, discharged home on 2L O2 continuous (previously on 3L O2 nocturnal and prn).   Review of Systems  Constitutional: Negative.   HENT: Negative.    Eyes: Negative.   Respiratory:  Positive for cough and shortness of breath. Negative for chest tightness.   Cardiovascular:  Negative for chest pain, palpitations and leg swelling.  Gastrointestinal:  Negative for abdominal distention, abdominal pain, constipation, diarrhea, nausea and vomiting.  Musculoskeletal: Negative.   Skin: Negative.   Neurological: Negative.   Psychiatric/Behavioral: Negative.     Objective:  Physical Exam Constitutional:      Appearance: She is well-developed.  HENT:     Head: Normocephalic and atraumatic.  Cardiovascular:     Rate and Rhythm: Normal rate and regular rhythm.  Pulmonary:     Effort: Pulmonary effort is normal. No respiratory distress.     Breath sounds: Normal breath sounds. No wheezing or rales.     Comments: oxygen Abdominal:     General: Bowel sounds are normal. There is no distension.     Palpations: Abdomen is soft.     Tenderness: There is no abdominal tenderness. There is no rebound.  Musculoskeletal:     Cervical back: Normal range of motion.  Skin:    General: Skin is warm and dry.  Neurological:     Mental Status: She is alert and oriented to person, place, and time.     Coordination: Coordination normal.    Vitals:   07/01/21 1105  BP: 132/80  Pulse: 70  Temp: 97.6 F (36.4 C)  TempSrc: Oral  SpO2: 90%  Weight: 114 lb 3.2 oz (51.8 kg)  Height: 5\' 2"  (1.575 m)    This visit occurred during the SARS-CoV-2 public health emergency.  Safety protocols were in place, including  screening questions prior to the visit, additional usage of staff PPE, and extensive cleaning of exam room while observing appropriate contact time as indicated for disinfecting solutions.   Assessment & Plan:  Flu shot given at visit

## 2021-07-01 NOTE — Patient Instructions (Addendum)
I would recommend to finish the prednisone and let us know if the breathing worsens.   We have given you the flu shot today and you can get a covid-19 booster when you want. Go to vaccines.gov to find one close to you.  Think strongly about stopping smoking.

## 2021-07-03 NOTE — Assessment & Plan Note (Signed)
Overall improving and advised to stop smoking as well as finish prednisone taper as directed. Call or return for any worsening.

## 2021-07-03 NOTE — Assessment & Plan Note (Signed)
No sign of flare today. She was given IV lasix in the hospital due to concern for flare.

## 2021-07-03 NOTE — Assessment & Plan Note (Signed)
Advised to monitor recovery closely and take prednisone taper as directed.

## 2021-07-04 ENCOUNTER — Other Ambulatory Visit: Payer: Self-pay | Admitting: Nurse Practitioner

## 2021-07-07 NOTE — Chronic Care Management (AMB) (Signed)
  Chronic Care Management   Outreach Note  07/07/2021 Name: CHRISTINEA BRIZUELA MRN: [id number] DOB: Dec 27, 1945  DONEISHA IVEY is a 75 y.o. year old female who is a primary care patient of [name], MD. I reached out to [name] by phone today in response to a referral sent by Ms. Lesia Hausen [place]'s primary care provider.  A second unsuccessful telephone outreach was attempted today. The patient was referred to the case management team for assistance with care management and care coordination.   Follow Up Plan: A HIPAA compliant phone message was left for the patient providing contact information and requesting a return call. The care management team will reach out to the patient again over the next 7 days. If patient returns call to provider office, please advise to call [place] at [phone].  [place] Management  Direct Dial: [phone]

## 2021-07-09 NOTE — Chronic Care Management (AMB) (Signed)
  Chronic Care Management   Outreach Note  07/09/2021 Name: [name] MRN: [id number] DOB: [birth year]  [name] is a 75 y.o. year old female who is a primary care patient of [name], MD. I reached out to [name] by phone today in response to a referral sent by Ms. [name]'s primary care provider.  Third unsuccessful telephone outreach was attempted today. The patient was referred to the case management team for assistance with care management and care coordination. The patient's primary care provider has been notified of our unsuccessful attempts to make or maintain contact with the patient. The care management team is pleased to engage with this patient at any time in the future should he/she be interested in assistance from the care management team.   Follow Up Plan: A HIPAA compliant phone message was left for the patient providing contact information and requesting a return call. If patient returns call to provider office, please advise to call [name] at [phone].  [place] Management  Direct Dial: [phone]

## 2021-07-10 ENCOUNTER — Telehealth: Payer: Self-pay | Admitting: *Deleted

## 2021-07-10 NOTE — Chronic Care Management (AMB) (Signed)
  Chronic Care Management   Note  07/10/2021 Name: DOMINIQUE RESSEL MRN: [id number] DOB: 31-Jul-1946  [name] is a 75 y.o. year old female who is a primary care patient of [name], MD. I reached out to [name] by phone today in response to a referral sent by Ms. Lesia Hausen [place]'s PCP.  Ms. Schwark was given information about Chronic Care Management services today including:  CCM service includes personalized support from designated clinical staff supervised by her physician, including individualized plan of care and coordination with other care providers 24/7 contact phone numbers for assistance for urgent and routine care needs. Service will only be billed when office clinical staff spend 20 minutes or more in a month to coordinate care. Only one practitioner may furnish and bill the service in a calendar month. The patient may stop CCM services at any time (effective at the end of the month) by phone call to the office staff. The patient is responsible for co-pay (up to 20% after annual deductible is met) if co-pay is required by the individual health plan.   Patient agreed to services and verbal consent obtained.   Follow up plan: Telephone appointment with care management team member scheduled for:07/28/21  [place] Management  Direct Dial: [phone]

## 2021-07-16 ENCOUNTER — Other Ambulatory Visit: Payer: Self-pay

## 2021-07-16 ENCOUNTER — Telehealth: Payer: Self-pay

## 2021-07-16 ENCOUNTER — Other Ambulatory Visit: Payer: Self-pay | Admitting: Internal Medicine

## 2021-07-16 ENCOUNTER — Ambulatory Visit (INDEPENDENT_AMBULATORY_CARE_PROVIDER_SITE_OTHER): Payer: Medicare Other | Admitting: Pulmonary Disease

## 2021-07-16 ENCOUNTER — Encounter: Payer: Self-pay | Admitting: Pulmonary Disease

## 2021-07-16 VITALS — BP 112/68 | HR 80 | Temp 97.7°F | Ht 62.0 in | Wt 116.0 lb

## 2021-07-16 DIAGNOSIS — F419 Anxiety disorder, unspecified: Secondary | ICD-10-CM

## 2021-07-16 DIAGNOSIS — I2510 Atherosclerotic heart disease of native coronary artery without angina pectoris: Secondary | ICD-10-CM

## 2021-07-16 DIAGNOSIS — J438 Other emphysema: Secondary | ICD-10-CM | POA: Diagnosis not present

## 2021-07-16 DIAGNOSIS — E785 Hyperlipidemia, unspecified: Secondary | ICD-10-CM

## 2021-07-16 DIAGNOSIS — J9611 Chronic respiratory failure with hypoxia: Secondary | ICD-10-CM

## 2021-07-16 MED ORDER — ALPRAZOLAM 1 MG PO TABS: 1.0000 mg | ORAL_TABLET | Freq: Three times a day (TID) | ORAL | 0 refills | Status: DC | PRN

## 2021-07-16 NOTE — Progress Notes (Signed)
@Patient  ID: [name], female    DOB: 1945-11-30, 75 y.o.   MRN: [id number]  Chief Complaint  Patient presents with   Consult    Patient reports that she is doing well overall.    Referring provider: [name], MD  HPI:   75 y.o. with presumed COPD and chronic hypoxemic respiratory failure whom we are seeing in consultation for evaluation of the same.  Note from referring provider reviewed.  Discharge summary x2 12/14/2020 and 06/16/2021 reviewed.  Most recent pulmonary note 2016 reviewed.  Overall, patient states she is doing okay.  At times conversation is difficult, she is circumstantial and tangential at times.  Has baseline dyspnea.  She thinks is no better or worse.  Certainly better since recent hospitalization.  Worse on inclines or stairs.  No time of day when things are better or worse.  No position to make things better or worse.  No seasonal or environmental factors she can identify that make things better or worse.  She is using Symbicort and Spiriva.  She likes these and feels like they are helpful.  She is unwilling to discuss any other changes.  We will use nebulized short acting bronchodilators as needed.  These are helpful at times.  No other alleviating or exacerbating factors.  Most recent chest x-ray 06/21/2021 reviewed interpreted as clear lungs, hyperinflation on the lateral view.  Prior most recent chest x-ray/21 2022 reviewed and interpreted as clear lungs, similar hyperinflation on the lateral view.  PMH: CAD, CHF, asthma, hyperlipidemia, headaches, seasonal allergies Surgical history: Breast cancer surgery, cardiac stent Family history: Mother with Alzheimer's, sister with schizophrenia, breast cancer, heart disease, diabetes, brother with hyperlipidemia Social history: Former smoker, 25+ pack year, lives at [place] History  Problem Relation Age of Onset   Alzheimer's disease Mother    Schizophrenia Sister    Breast cancer Sister         27s   Heart disease Sister    Diabetes Sister    Hyperlipidemia Brother    Cancer Neg Hx    Stroke Neg Hx    COPD Neg Hx    Depression Neg Hx    Drug abuse Neg Hx    Early death Neg Hx    Hypertension Neg Hx    Kidney disease Neg Hx       Questionaires / Pulmonary Flowsheets:   ACT:  No flowsheet data found.  MMRC: No flowsheet data found.  Epworth:  No flowsheet data found.  Tests:   FENO:  No results found for: NITRICOXIDE  PFT: No flowsheet data found.  WALK:  No flowsheet data found.  Imaging: Personally reviewed and as per EMR discussion of this note DG Chest 2 View  Result Date: 06/21/2021 CLINICAL DATA:  COPD EXAM: CHEST - 2 VIEW COMPARISON:  June 18, 2021, January 15, 2021 FINDINGS: The cardiomediastinal silhouette is unchanged in contour.Unchanged prominence of bilateral hila. Atherosclerotic calcifications. Asymmetric RIGHT apical pleural thickening, similar in comparison to prior. No pleural effusion. No pneumothorax. Diffuse interstitial prominence and peribronchial cuffing, similar in comparison to prior. Surgical clips project over the RIGHT axilla. Visualized abdomen is unremarkable. IMPRESSION: Similar appearance of peribronchial cuffing and diffuse interstitial prominence which could reflect underlying bronchitis and small airways disease. Given underlying COPD, recommend evaluation for candidacy for lung cancer screening CT. Electronically Signed   By: Valentino Saxon M.D.   On: 06/21/2021 18:29   DG Chest Port 1 View  Result Date: 06/18/2021 CLINICAL  DATA:  dyspnea EXAM: PORTABLE CHEST 1 VIEW COMPARISON:  April 2022 FINDINGS: The heart appears enlarged. No pleural effusion. No pneumothorax. Slightly increased interstitial markings without lobar consolidation. Aortic calcifications. Surgical clips overlying the right axilla. IMPRESSION: Cardiomegaly with slightly increased interstitial markings suggestive of mild pulmonary edema. No large effusion  or lobar consolidation. Electronically Signed   By: Albin Felling M.D.   On: 06/18/2021 09:47    Lab Results: Personally reviewed, no significant elevation of eosinophils in the past CBC    Component Value Date/Time   WBC 8.2 06/22/2021 0511   RBC 3.90 06/22/2021 0511   HGB 12.6 06/22/2021 0511   HGB 14.4 06/17/2014 1330   HCT 38.6 06/22/2021 0511   HCT 44.4 06/17/2014 1330   PLT 196 06/22/2021 0511   PLT 253 06/17/2014 1330   MCV 99.0 06/22/2021 0511   MCV 97.6 06/17/2014 1330   MCH 32.3 06/22/2021 0511   MCHC 32.6 06/22/2021 0511   RDW 13.0 06/22/2021 0511   RDW 12.9 06/17/2014 1330   LYMPHSABS 0.7 06/20/2021 0415   LYMPHSABS 1.6 06/17/2014 1330   MONOABS 0.6 06/20/2021 0415   MONOABS 0.5 06/17/2014 1330   EOSABS 0.0 06/20/2021 0415   EOSABS 0.2 06/17/2014 1330   BASOSABS 0.0 06/20/2021 0415   BASOSABS 0.1 06/17/2014 1330    BMET    Component Value Date/Time   NA 135 06/22/2021 0511   NA 140 06/17/2014 1330   K 3.6 06/22/2021 0511   K 4.3 06/17/2014 1330   CL 92 (L) 06/22/2021 0511   CO2 36 (H) 06/22/2021 0511   CO2 31 (H) 06/17/2014 1330   GLUCOSE 90 06/22/2021 0511   GLUCOSE 92 06/17/2014 1330   BUN 19 06/22/2021 0511   BUN 12.8 06/17/2014 1330   CREATININE 0.86 06/22/2021 0511   CREATININE 1.1 06/17/2014 1330   CALCIUM 8.9 06/22/2021 0511   CALCIUM 10.7 (H) 06/17/2014 1330   GFRNONAA >60 06/22/2021 0511   GFRAA >60 04/25/2020 0643    BNP    Component Value Date/Time   BNP 50.9 06/22/2021 0511    ProBNP    Component Value Date/Time   PROBNP 293.8 (H) 07/07/2014 0855    Specialty Problems       Pulmonary Problems   Other emphysema ([place])   Oxygen dependent   Chronic respiratory failure with hypoxia (HCC)   COPD with acute exacerbation (HCC)   Acute on chronic respiratory failure with hypoxia and hypercapnia (HCC)   LRTI (lower respiratory tract infection)    Allergies  Allergen Reactions   Ceftriaxone Anaphylaxis and Other (See  Comments)    *ROCEPHIN*  "Blow up like a balloon"   Hydroxyzine Shortness Of Breath and Other (See Comments)    Pt states med make her light headed, get sob sxs   Doxycycline Nausea And Vomiting   Lexapro [Escitalopram] Other (See Comments)    Pt states med make her dizzy    Immunization History  Administered Date(s) Administered   Fluad Quad(high Dose 65+) 07/01/2021   Influenza, High Dose Seasonal PF 06/19/2015, 06/05/2017, 05/23/2019, 06/04/2020   Influenza,inj,Quad PF,6+ Mos 06/22/2013, 07/09/2014   Influenza,trivalent, recombinat, inj, PF 05/22/2018   Influenza-Unspecified 05/25/2019, 06/06/2020   PFIZER(Purple Top)SARS-COV-2 Vaccination 10/20/2019, 11/10/2019   Pneumococcal Conjugate-13 10/03/2013   Pneumococcal Polysaccharide-23 04/10/2014, 05/25/2019   Tdap 03/04/2014    Past Medical History:  Diagnosis Date   Abnormal LFTs 08/02/2011   Acute liver failure 06/19/2013   Acute renal failure ([place]) 06/19/2013   Acute respiratory failure (  [place]) 06/19/2013   Altered mental status 08/01/2011   Angina    Anxiety    Anxiety state, unspecified 12/03/2013   Aortic stenosis    mild   Arthritis    "hands" (02/03/2017)   Breast cancer, right breast ([place]) dx'd 2008   CAD (coronary artery disease) of artery bypass graft 11/06/2013   CAD (coronary artery disease), MI R/O 08/01/2011   PCI in 2006 (bare metal stent, unknown artery) - [place]    CAP (community acquired pneumonia) 09/11/2018   CHF,  acute diastolic, BNP 4k on admissio 08/01/2011   Chronic bronchitis (HCC)    Chronic respiratory failure ([place]) 02/02/2012   Compression fracture of L1 lumbar vertebra ([place]) 02/02/2012   COPD (chronic obstructive pulmonary disease) ([place])    oxygen-dependent 4LPM [place]   Coronary artery disease 2006   2 stents w/previous MI   Depressed    Diastolic CHF ([place])    Dyslipidemia    Family history of adverse reaction to anesthesia    "daughter had c-section; missed twice w/epidural"   GERD (gastroesophageal  reflux disease)    Heart murmur    History of blood transfusion    "w/my colon OR"   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   History of nuclear stress test 08/03/2011   attenuation at apex - no perfusion defects    HTN (hypertension) 11/06/2013   Hyperkalemia, on ACE prior to admission 11/11/2011   Hypertension    Hyponatremia 01/31/2012   Migraine    "qod to q couple months since I was 21" (02/03/2017)   Mild aortic stenosis 08/01/2011   AVA 1.69 cm2 (06/02/2011)    Moderate to severe pulmonary hypertension (HCC)    NSTEMI (non-ST elevated myocardial infarction) ([place]) 2006   NSVT (nonsustained ventricular tachycardia)    h/o   On home oxygen therapy    "3L just at night; have it available prn duringtheday" (09/11/2018)   Pneumonia    "alot of times" (02/03/2017)   SBO (small bowel obstruction) ([place]) 04/23/2020   PARTIAL     Tobacco History: Social History   Tobacco Use  Smoking Status Some Days   Packs/day: 0.50   Years: 53.00   Pack years: 26.50   Types: Cigarettes  Smokeless Tobacco Never   Ready to quit: Not Answered Counseling given: Not Answered     Outpatient Encounter Medications as of 07/16/2021  Medication Sig   ALPRAZolam (XANAX) 1 MG tablet TAKE 1 TABLET BY MOUTH THREE TIMES A DAY AS NEEDED (Patient taking differently: Take 1 mg by mouth 3 (three) times daily as needed for anxiety.)   budesonide-formoterol (SYMBICORT) 160-4.5 MCG/ACT inhaler Inhale 2 puffs into the lungs in the morning and at bedtime.   butalbital-acetaminophen-caffeine (FIORICET) 50-325-40 MG tablet Take 1 tablet by mouth every 4 (four) hours as needed for headache.   cholecalciferol (VITAMIN D) 1000 units tablet Take 1,000 Units by mouth daily.   denosumab ([name]) 60 MG/ML SOSY injection Inject 60 mg into the skin every 6 (six) months.   diclofenac sodium (VOLTAREN) 1 % GEL Apply 4 g topically as needed (for pain).   ENULOSE 10 GM/15ML SOLN TAKE 45 MILLILITERS BY MOUTH 3 TIMES A DAY AS NEEDED  FOR CONSTIPATION (Patient taking differently: Take 30 g by mouth 3 (three) times daily as needed (constipation).)   fluticasone (FLONASE) 50 MCG/ACT nasal spray USE 2 SPRAYS INTO EACH NOSTRIL ONCE DAILY**REPEAT FOR 5 DAYS THEN STOP (Patient taking differently: Place 2 sprays into both nostrils daily as needed for  allergies.)   furosemide (LASIX) 40 MG tablet TAKE 1 TABLET BY MOUTH TWICE A DAY (Patient taking differently: Take 40 mg by mouth 2 (two) times daily.)   ipratropium (ATROVENT HFA) 17 MCG/ACT inhaler USE 2 PUFFS EVERY FOUR HOURS AS NEEDED (Patient taking differently: Inhale 2 puffs into the lungs every 4 (four) hours as needed for wheezing.)   ipratropium-albuterol (DUONEB) 0.5-2.5 (3) MG/3ML SOLN Inhale 3 mLs into the lungs every 6 (six) hours as needed (for SOB, wheeze or cough).   levocetirizine (XYZAL) 5 MG tablet TAKE 1 TABLET BY MOUTH EVERY DAY IN THE EVENING (Patient taking differently: Take 5 mg by mouth every evening.)   linaclotide (LINZESS) 72 MCG capsule Take 1 capsule (72 mcg total) by mouth daily before breakfast.   montelukast (SINGULAIR) 10 MG tablet TAKE 1 TABLET BY MOUTH EVERYDAY AT BEDTIME (Patient taking differently: Take 10 mg by mouth at bedtime.)   nortriptyline (PAMELOR) 25 MG capsule Take 2 capsules (50 mg total) by mouth at bedtime.   Omega 3 1200 MG CAPS Take 1,200 mg by mouth every morning.    OXYGEN-HELIUM IN Inhale 3 L into the lungs See admin instructions. Uses when needed during the day, uses continuous throughout the night   pantoprazole (PROTONIX) 40 MG tablet TAKE 1 TABLET BY MOUTH EVERY DAY (Patient taking differently: Take 40 mg by mouth daily.)   Pitavastatin Calcium (LIVALO) 2 MG TABS Take 1 tablet (2 mg total) by mouth daily.   polyvinyl alcohol (LIQUIFILM TEARS) 1.4 % ophthalmic solution Place 1 drop into both eyes as needed for dry eyes.   potassium chloride SA (KLOR-CON M20) 20 MEQ tablet Take 1 tablet (20 mEq total) by mouth daily.   VENTOLIN HFA  108 (90 Base) MCG/ACT inhaler INHALE 1-2 PUFFS BY MOUTH EVERY 6 HOURS AS NEEDED FOR WHEEZE OR SHORTNESS OF BREATH (Patient taking differently: Inhale 1-2 puffs into the lungs every 6 (six) hours as needed for wheezing or shortness of breath.)   vitamin B-12 (CYANOCOBALAMIN) 1000 MCG tablet Take 1 tablet (1,000 mcg total) by mouth daily.   vitamin C (ASCORBIC ACID) 500 MG tablet Take 500 mg by mouth daily.   [DISCONTINUED] predniSONE (DELTASONE) 20 MG tablet Take 2 tablets (40 mg total) by mouth daily with breakfast. (Patient not taking: Reported on 07/16/2021)   No facility-administered encounter medications on file as of 07/16/2021.     Review of Systems  Review of Systems  No chest pain with exertion.  No orthopnea or PND.  Comprehensive review of systems otherwise negative. Physical Exam  BP 112/68 (BP Location: Left Arm, Patient Position: Sitting, Cuff Size: Normal)   Pulse 80   Temp 97.7 F (36.5 C) (Oral)   Ht 5\' 2"  (1.575 m)   Wt 116 lb (52.6 kg)   SpO2 93%   BMI 21.22 kg/m   Wt Readings from Last 5 Encounters:  07/16/21 116 lb (52.6 kg)  07/01/21 114 lb 3.2 oz (51.8 kg)  06/22/21 117 lb 11.6 oz (53.4 kg)  04/09/21 118 lb (53.5 kg)  03/17/21 114 lb 12.8 oz (52.1 kg)    BMI Readings from Last 5 Encounters:  07/16/21 21.22 kg/m  07/01/21 20.89 kg/m  06/22/21 21.53 kg/m  04/09/21 21.58 kg/m  03/17/21 21.00 kg/m     Physical Exam General: Chronically ill-appearing, sitting in chair Eyes: EOMI, no icterus Neck: Supple, no JVP Cardiovascular: Tachycardic, regular rhythm Pulmonary: Severely diminished breath sounds throughout, normal work of breathing Abdomen: Nondistended, bowel sounds  MSK: No  synovitis, no joint effusion Neuro: Slow gait, no weakness Psych: Normal mood, full affect, tangential often during conversation   Assessment & Plan:   Chronic hypoxemic respiratory failure: Presumably related to emphysema, COPD.  No PFTs to formally document.   Seems stable.  Continue oxygen supplementation.  Presumed COPD exacerbation: X2 in 2022 most recently 05/2021.  Brief time on BiPAP.  She did not tolerate due to anxiety.  Continue triple inhaled therapy with Symbicort and ipratropium.  She is unwilling to trial other inhalers.  Advised her to please contact the office if develops worsening shortness of breath or cough so we can try to treat her as an outpatient and avoid hospitalization.   Return in about 1 year (around 07/16/2022).   Lanier Clam, MD 07/16/2021

## 2021-07-16 NOTE — Telephone Encounter (Signed)
1.Medication Requested: Xanax  2. Pharmacy (Name, [place]): [facility]  3. On Med List: yes  4. Last Visit with PCP: 04/09/2021     Agent: Please be advised that RX refills may take up to 3 business days. We ask that you follow-up with your pharmacy.

## 2021-07-16 NOTE — Patient Instructions (Signed)
Nice to meet you!!  Continue the inhalers you have  Please call me if your breathing worsens and we can treat over the phone  Return to clinic in 1 year or sooner as needed

## 2021-07-28 ENCOUNTER — Telehealth: Payer: Medicare Other

## 2021-07-28 ENCOUNTER — Telehealth: Payer: Self-pay | Admitting: *Deleted

## 2021-07-28 ENCOUNTER — Encounter: Payer: Self-pay | Admitting: *Deleted

## 2021-07-28 NOTE — Telephone Encounter (Signed)
  Chronic Care Management   Follow Up Note   07/28/2021 Name: [name] MRN: [id number] DOB: [birth year]  Referred by: [name], MD Reason for referral : Chronic Care Management (CCM RN CM Initial Telephone Visit- Unsuccessful Outreach)  An unsuccessful telephone outreach was attempted today. The patient was referred to the case management team for assistance with care management and care coordination.   Follow Up Plan:  A HIPPA compliant phone message was left for the patient providing contact information and requesting a return call Will place request with scheduling care guide to contact patient to re-schedule today's missed CCM RN initial telephone appointment   Oneta Rack, RN, BSN, [place] [phone]: direct office [phone]: mobile

## 2021-07-28 NOTE — Telephone Encounter (Signed)
Patient states she is returning a call  Patient is requesting a call back

## 2021-07-29 ENCOUNTER — Telehealth: Payer: Self-pay | Admitting: *Deleted

## 2021-07-29 NOTE — Chronic Care Management (AMB) (Signed)
  Care Management   Note  07/29/2021 Name: [name] MRN: [id number] DOB: 1946-03-22  [name] is a 75 y.o. year old female who is a primary care patient of [name], MD and is actively engaged with the care management team. I reached out to [name] by phone today to assist with re-scheduling a follow up visit with the RN Case Manager  Follow up plan: Unsuccessful telephone outreach attempt made. A HIPAA compliant phone message was left for the patient providing contact information and requesting a return call.  The care management team will reach out to the patient again over the next 7 days.  If patient returns call to provider office, please advise to call [place]  at [phone].  [place] Management  Direct Dial: [phone]

## 2021-07-30 NOTE — Chronic Care Management (AMB) (Signed)
  Care Management   Note  07/30/2021 Name: [name] MRN: [id number] DOB: 1946-02-28  [name] is a 75 y.o. year old female who is a primary care patient of [name], MD and is actively engaged with the care management team. I reached out to [name] by phone today to assist with re-scheduling an initial visit with the RN Case Manager  Follow up plan: Telephone appointment with care management team member scheduled for:08/05/21  [place] [facility]  Direct Dial: [phone]

## 2021-07-31 ENCOUNTER — Other Ambulatory Visit: Payer: Self-pay | Admitting: Internal Medicine

## 2021-07-31 DIAGNOSIS — K7682 Hepatic encephalopathy: Secondary | ICD-10-CM

## 2021-08-05 ENCOUNTER — Ambulatory Visit (INDEPENDENT_AMBULATORY_CARE_PROVIDER_SITE_OTHER): Payer: Medicare Other | Admitting: *Deleted

## 2021-08-05 DIAGNOSIS — I5032 Chronic diastolic (congestive) heart failure: Secondary | ICD-10-CM

## 2021-08-05 DIAGNOSIS — J9611 Chronic respiratory failure with hypoxia: Secondary | ICD-10-CM

## 2021-08-05 DIAGNOSIS — J449 Chronic obstructive pulmonary disease, unspecified: Secondary | ICD-10-CM

## 2021-08-06 NOTE — Patient Instructions (Signed)
Visit Information   [name], it was nice talking with you today.    I look forward to talking to you again for a telephone update on Tuesday, August 18, 2021 at 2:00 pm- please be listening out for my call that day.  I will call as close to 2:00 as possible.   If you need to cancel or re-schedule our telephone visit, please call [phone] and one of our care guides will be happy to assist you.   I look forward to hearing about your progress.   Please don't hesitate to contact me if I can be of assistance to you before our next scheduled telephone appointment.   Oneta Rack, RN, BSN, [facility] RN Care Coordination- [place] [phone]: direct office [phone]: mobile   Patient Self-Care Activities: Patient [name] will: Self administer medications as prescribed Attend all scheduled provider appointments Call pharmacy for medication refills Call provider office for new concerns or questions Continue to follow heart healthy, low salt, low cholesterol diet Continue to monitor and write down on paper daily weights at home NOT smoke near home oxygen Review enclosed educational material about managing COPD at home  COPD Action Plan A COPD action plan is a description of what to do when you have a flare (exacerbation) of chronic obstructive pulmonary disease (COPD). Your action plan is a color-coded plan that lists the symptoms that indicate whether your condition is under control and what actions to take. If you have symptoms in the green zone, it means you are doing well that day. If you have symptoms in the yellow zone, it means you are having a bad day or an exacerbation. If you have symptoms in the red zone, you need urgent medical care. Follow the plan that you and your health care provider developed. Review your plan with your health care provider at each visit. Red zone Symptoms in this zone mean that you should get medical help right  away. They include: Feeling very short of breath, even when you are resting. Not being able to do any activities because of poor breathing. Not being able to sleep because of poor breathing. Fever or shaking chills. Feeling confused or very sleepy. Chest pain. Coughing up blood. If you have any of these symptoms, call emergency services (911 in the [place] or go to the nearest emergency room. Yellow zone Symptoms in this zone mean that your condition may be getting worse. They include: Feeling more short of breath than usual. Having less energy for daily activities than usual. Phlegm or mucus that is thicker than usual. Needing to use your rescue inhaler or nebulizer more often than usual. More ankle swelling than usual. Coughing more than usual. Feeling like you have a chest cold. Trouble sleeping due to COPD symptoms. Decreased appetite. COPD medicines not helping as much as usual. If you experience any "yellow" symptoms: Keep taking your daily medicines as directed. Use your quick-relief inhaler as told by your health care provider. If you were prescribed steroid medicine to take by mouth (oral medicine), start taking it as told by your health care provider. If you were prescribed an antibiotic medicine, start taking it as told by your health care provider. Do not stop taking the antibiotic even if you start to feel better. Use oxygen as told by your health care provider. Get more rest. Do your pursed-lip breathing exercises. Do not smoke. Avoid any irritants in the air. If your signs and symptoms do not  improve after taking these steps, call your health care provider right away. Green zone Symptoms in this zone mean that you are doing well. They include: Being able to do your usual activities and exercise. Having the usual amount of coughing, including the same amount of phlegm or mucus. Being able to sleep well. Having a good appetite. Where to find more information: You can  find more information about COPD from: American Lung Association, My COPD Action Plan: www.lung.org COPD Foundation: www.copdfoundation.[place]: https://wilson-eaton.com/ Follow these instructions at home: Continue taking your daily medicines as told by your health care provider. Make sure you receive all the immunizations that your health care provider recommends, especially the pneumococcal and influenza vaccines. Wash your hands often with soap and water. Have family members wash their hands too. Regular hand washing can help prevent infections. Follow your usual exercise and diet plan. Avoid irritants in the air, such as smoke. Do not use any products that contain nicotine or tobacco. These products include cigarettes, chewing tobacco, and vaping devices, such as e-cigarettes. If you need help quitting, ask your health care provider. Summary A COPD action plan tells you what to do when you have a flare (exacerbation) of chronic obstructive pulmonary disease (COPD). Follow each action plan for your symptoms. If you have any symptoms in the red zone, call emergency services (911 in the [place] or go to the nearest emergency room. This information is not intended to replace advice given to you by your health care provider. Make sure you discuss any questions you have with your health care provider. Document Revised: 07/22/2020 Document Reviewed: 07/22/2020 Elsevier Patient Education  2022 [place].    PATIENT GOALS/PLAN OF CARE:  Care Plan : RN Care Manager Plan of Care  Updates made by Knox Royalty, RN since 08/06/2021 12:00 AM     Problem: Chronic Disease Management Needs   Priority: High     Long-Range Goal: Development of plan of care for long term chronic disease management   Start Date: 08/05/2021  Expected End Date: 08/05/2022  Priority: High  Note:   Current Barriers:  Chronic Disease Management support and education needs related to CHF and  COPD Recent hospitalization September 22-26, 2022 for acute on chronic Respiratory failure/ COPD/ CHF Ongoing tobacco use in setting of COPD/ CHF, use of chronic home oxygen  RNCM Clinical Goal(s):  Patient will demonstrate Ongoing health management independence for management of CHF/ COPD  through collaboration with RN Care manager, provider, and care team.   Interventions: 1:1 collaboration with primary care provider regarding development and update of comprehensive plan of care as evidenced by provider attestation and co-signature Inter-disciplinary care team collaboration (see longitudinal plan of care) Evaluation of current treatment plan related to  self management and patient's adherence to plan as established by provider  COPD Interventions:   Provided patient with basic written and verbal COPD education on self care/management/and exacerbation prevention; Advised patient to track and manage COPD triggers;  Provided instruction about proper use of medications used for management of COPD including inhalers; Advised patient to self assesses COPD action plan zone and make appointment with provider if in the yellow zone for 48 hours without improvement; Advised patient to engage in light exercise as tolerated 3-5 days a week to aid in the the management of COPD; Provided education about and advised patient to utilize infection prevention strategies to reduce risk of respiratory infection; Discussed the importance of adequate rest and management  of fatigue with COPD; Screening for signs and symptoms of depression related to chronic disease state;  Assessed social determinant of health barriers;  Reviewed recent hospitalization with patient: reports "doing better" since discharge; denies clinical concerns today Reviewed recent post-hospital PCP office visit 07/01/21- confirmed patient obtained flu vaccine for 2022-23 flu/ winter seaon Discussed patient's use of home O2- uses "mainly at  night;" during day "as needed;" Confirmed patient continues smoking "4 or 5 cigarettes" a day: safe use of home O2 in light of ongoing smoking discussed- she reports she smokes outside, "never near the oxygen" Smoking cessation encouraged- patient not interested Reviewed 07/16/21 pulmonary provider office visit- confirmed no changes to overall plan of care Discussed medication management with patient: independently self-manages medications; uses pill box; denies medication concerns today Reviewed/ provided education around use of inhalers- reports good general understanding of difference between rescue/ maintenance inhalers: reports adherence to all medications, including inhalers; reports uses nebulizer and rescue inhalers "daily" Discussed current action plan for COPD exacerbation: rest, pacing activities, rescue inhaler, nebulizer, apply home O2- will provide additional printed educational material  Heart Failure Interventions: Basic overview and discussion of pathophysiology of Heart Failure reviewed; Provided education on low sodium diet; Assessed need for readable accurate scales in home; Advised patient to weigh each morning after emptying bladder; Discussed importance of daily weight and advised patient to weigh and record daily; Reviewed role of diuretics in prevention of fluid overload and management of heart failure; Discussed the importance of keeping all appointments with provider; Provided patient with education about the role of exercise in the management of heart failure; Confirmed patient monitors/ records daily weights at home: education provided around rationale for daily weights, weight gain guidelines in setting of CHF/ COPD Reviewed recent weights at home: reports daily weight ranges consistently between "113-114 lbs" with a weight today of "113 lbs" Confirmed patient currently following low salt, heart healthy diet- positive reinforcement provided  Patient Goals/Self-Care  Activities: As evidenced by review of EHR, collaboration with care team, and patient reporting during CCM RN CM outreach, Patient [name] will: Self administer medications as prescribed Attend all scheduled provider appointments Call pharmacy for medication refills Call provider office for new concerns or questions Continue to follow heart healthy, low salt, low cholesterol diet Continue to monitor and write down on paper daily weights at home NOT smoke near home oxygen Review enclosed educational material about managing COPD at home  Follow Up Plan:   Telephone follow up appointment with care management team member scheduled for:  Tuesday, November 22, at 2:00 pm The patient has been provided with contact information for the care management team and has been advised to call with any health related questions or concerns     Consent to CCM Services: [name] was given information 07/10/21 about Chronic Care Management services including:  CCM service includes personalized support from designated clinical staff supervised by her physician, including individualized plan of care and coordination with other care providers 24/7 contact phone numbers for assistance for urgent and routine care needs. Service will only be billed when office clinical staff spend 20 minutes or more in a month to coordinate care. Only one practitioner may furnish and bill the service in a calendar month. The patient may stop CCM services at any time (effective at the end of the month) by phone call to the office staff. The patient will be responsible for cost sharing (co-pay) of up to 20% of the service fee (after annual deductible is  met).  Patient agreed to services and verbal consent obtained.   Patient verbalizes understanding of instructions provided today and agrees to view in MyChart Telephone follow up appointment with care management team member scheduled for:  Tuesday, August 18, 2021 at 2:00 pm The  patient has been provided with contact information for the care management team and has been advised to call with any health related questions or concerns  Oneta Rack, RN, BSN, [place] [phone]: direct office [phone]: mobile

## 2021-08-06 NOTE — Chronic Care Management (AMB) (Signed)
Chronic Care Management   CCM RN Visit Note  08/06/2021 Name: [name] MRN: [id number] DOB: [birth year]  Subjective: [name] is a 75 y.o. year old female who is a primary care patient of [name], MD. The care management team was consulted for assistance with disease management and care coordination needs.    Engaged with patient by telephone for initial visit in response to provider referral for case management and/or care coordination services.   Consent to Services:  The patient was given information about Chronic Care Management services, agreed to services, and gave verbal consent 07/10/21 prior to initiation of services.  Please see initial visit note for detailed documentation.  Patient agreed to services and verbal consent obtained.   Assessment: Review of patient past medical history, allergies, medications, health status, including review of consultants reports, laboratory and other test data, was performed as part of comprehensive evaluation and provision of chronic care management services.   SDOH (Social Determinants of Health) assessments and interventions performed:  SDOH Interventions    Flowsheet Row Most Recent Value  SDOH Interventions   Food Insecurity Interventions Intervention Not Indicated  [Receives $200/ month food stamps]  Housing Interventions Intervention Not Indicated  Transportation Interventions Intervention Not Indicated  [daughter provides transportation/ she lives with daughter]       CCM Care Plan Allergies  Allergen Reactions   Ceftriaxone Anaphylaxis and Other (See Comments)    *ROCEPHIN*  "Blow up like a balloon"   Hydroxyzine Shortness Of Breath and Other (See Comments)    Pt states med make her light headed, get sob sxs   Doxycycline Nausea And Vomiting   Lexapro [Escitalopram] Other (See Comments)    Pt states med make her dizzy   Outpatient Encounter Medications as of 08/05/2021  Medication Sig   ALPRAZolam  (XANAX) 1 MG tablet Take 1 tablet (1 mg total) by mouth 3 (three) times daily as needed for anxiety.   budesonide-formoterol (SYMBICORT) 160-4.5 MCG/ACT inhaler Inhale 2 puffs into the lungs in the morning and at bedtime.   butalbital-acetaminophen-caffeine (FIORICET) 50-325-40 MG tablet Take 1 tablet by mouth every 4 (four) hours as needed for headache.   cholecalciferol (VITAMIN D) 1000 units tablet Take 1,000 Units by mouth daily.   denosumab ([name]) 60 MG/ML SOSY injection Inject 60 mg into the skin every 6 (six) months.   diclofenac sodium (VOLTAREN) 1 % GEL Apply 4 g topically as needed (for pain).   fluticasone (FLONASE) 50 MCG/ACT nasal spray USE 2 SPRAYS INTO EACH NOSTRIL ONCE DAILY**REPEAT FOR 5 DAYS THEN STOP (Patient taking differently: Place 2 sprays into both nostrils daily as needed for allergies.)   furosemide (LASIX) 40 MG tablet TAKE 1 TABLET BY MOUTH TWICE A DAY (Patient taking differently: Take 40 mg by mouth 2 (two) times daily.)   ipratropium (ATROVENT HFA) 17 MCG/ACT inhaler USE 2 PUFFS EVERY FOUR HOURS AS NEEDED (Patient taking differently: Inhale 2 puffs into the lungs every 4 (four) hours as needed for wheezing.)   ipratropium-albuterol (DUONEB) 0.5-2.5 (3) MG/3ML SOLN Inhale 3 mLs into the lungs every 6 (six) hours as needed (for SOB, wheeze or cough).   lactulose, encephalopathy, (ENULOSE) 10 GM/15ML SOLN Take 45 mLs (30 g total) by mouth 3 (three) times daily as needed (constipation).   levocetirizine (XYZAL) 5 MG tablet TAKE 1 TABLET BY MOUTH EVERY DAY IN THE EVENING (Patient taking differently: Take 5 mg by mouth every evening.)   linaclotide (LINZESS) 72 MCG capsule Take 1  capsule (72 mcg total) by mouth daily before breakfast.   LIVALO 2 MG TABS TAKE 1 TABLET BY MOUTH EVERY DAY   montelukast (SINGULAIR) 10 MG tablet TAKE 1 TABLET BY MOUTH EVERYDAY AT BEDTIME (Patient taking differently: Take 10 mg by mouth at bedtime.)   nortriptyline (PAMELOR) 25 MG capsule Take 2  capsules (50 mg total) by mouth at bedtime.   Omega 3 1200 MG CAPS Take 1,200 mg by mouth every morning.    OXYGEN-HELIUM IN Inhale 3 L into the lungs See admin instructions. Uses when needed during the day, uses continuous throughout the night   pantoprazole (PROTONIX) 40 MG tablet TAKE 1 TABLET BY MOUTH EVERY DAY (Patient taking differently: Take 40 mg by mouth daily.)   polyvinyl alcohol (LIQUIFILM TEARS) 1.4 % ophthalmic solution Place 1 drop into both eyes as needed for dry eyes.   potassium chloride SA (KLOR-CON M20) 20 MEQ tablet Take 1 tablet (20 mEq total) by mouth daily.   VENTOLIN HFA 108 (90 Base) MCG/ACT inhaler INHALE 1-2 PUFFS BY MOUTH EVERY 6 HOURS AS NEEDED FOR WHEEZE OR SHORTNESS OF BREATH (Patient taking differently: Inhale 1-2 puffs into the lungs every 6 (six) hours as needed for wheezing or shortness of breath.)   vitamin B-12 (CYANOCOBALAMIN) 1000 MCG tablet Take 1 tablet (1,000 mcg total) by mouth daily.   vitamin C (ASCORBIC ACID) 500 MG tablet Take 500 mg by mouth daily.   No facility-administered encounter medications on file as of 08/05/2021.   Patient Active Problem List   Diagnosis Date Noted   LRTI (lower respiratory tract infection) 06/23/2021   Acute on chronic respiratory failure with hypoxia and hypercapnia (HCC) 06/18/2021   Edema of right upper arm 04/09/2021   Atherosclerosis of aorta ([place]) 01/16/2021   Hyperglycemia 01/15/2021   Vitamin B12 deficiency anemia due to intrinsic factor deficiency 01/15/2021   Iron deficiency 01/15/2021   Hyperparathyroidism ([place]) 11/04/2020   Acute seborrheic dermatitis 10/30/2020   Chronic idiopathic constipation 05/06/2020   Small bowel obstruction ([place]) 04/22/2020   Migraine headache without aura    Chronic renal disease, stage 3, moderately decreased glomerular filtration rate (GFR) between 30-59 mL/min/1.73 square meter 03/20/2019   COPD with acute exacerbation ([place]) 08/24/2017   Chronic respiratory failure with  hypoxia ([place]) 08/24/2017   Anxiety 08/24/2017   Insomnia due to anxiety and fear 08/22/2017   Oxygen dependent 08/16/2017   Mixed hyperlipidemia 03/31/2016   Other emphysema ([place]) 06/14/2014   Coronary artery disease involving native coronary artery of native heart without angina pectoris 11/06/2013   HTN (hypertension) 11/06/2013   Breast cancer of upper-outer quadrant of right female breast ([place]) 08/21/2013   Osteoporosis 01/01/2013   Chronic diastolic CHF (congestive heart failure) ([place]) 01/31/2012   Aortic valve stenosis 08/01/2011    Class: Chronic   Conditions to be addressed/monitored:  CHF and COPD  Care Plan : RN Care Manager Plan of Care  Updates made by Knox Royalty, RN since 08/06/2021 12:00 AM     Problem: Chronic Disease Management Needs   Priority: High     Long-Range Goal: Development of plan of care for long term chronic disease management   Start Date: 08/05/2021  Expected End Date: 08/05/2022  Priority: High  Note:   Current Barriers:  Chronic Disease Management support and education needs related to CHF and COPD Recent hospitalization September 22-26, 2022 for acute on chronic Respiratory failure/ COPD/ CHF Ongoing tobacco use in setting of COPD/ CHF, use of chronic home oxygen  RNCM Clinical Goal(s):  Patient will demonstrate Ongoing health management independence for management of CHF/ COPD  through collaboration with RN Care manager, provider, and care team.   Interventions: 1:1 collaboration with primary care provider regarding development and update of comprehensive plan of care as evidenced by provider attestation and co-signature Inter-disciplinary care team collaboration (see longitudinal plan of care) Evaluation of current treatment plan related to  self management and patient's adherence to plan as established by provider  COPD Interventions:   Provided patient with basic written and verbal COPD education on self care/management/and  exacerbation prevention; Advised patient to track and manage COPD triggers;  Provided instruction about proper use of medications used for management of COPD including inhalers; Advised patient to self assesses COPD action plan zone and make appointment with provider if in the yellow zone for 48 hours without improvement; Advised patient to engage in light exercise as tolerated 3-5 days a week to aid in the the management of COPD; Provided education about and advised patient to utilize infection prevention strategies to reduce risk of respiratory infection; Discussed the importance of adequate rest and management of fatigue with COPD; Screening for signs and symptoms of depression related to chronic disease state;  Assessed social determinant of health barriers;  Reviewed recent hospitalization with patient: reports "doing better" since discharge; denies clinical concerns today Reviewed recent post-hospital PCP office visit 07/01/21- confirmed patient obtained flu vaccine for 2022-23 flu/ winter seaon Discussed patient's use of home O2- uses "mainly at night;" during day "as needed;" Confirmed patient continues smoking "4 or 5 cigarettes" a day: safe use of home O2 in light of ongoing smoking discussed- she reports she smokes outside, "never near the oxygen" Smoking cessation encouraged- patient not interested Reviewed 07/16/21 pulmonary provider office visit- confirmed no changes to overall plan of care Discussed medication management with patient: independently self-manages medications; uses pill box; denies medication concerns today Reviewed/ provided education around use of inhalers- reports good general understanding of difference between rescue/ maintenance inhalers: reports adherence to all medications, including inhalers; reports uses nebulizer and rescue inhalers "daily" Discussed current action plan for COPD exacerbation: rest, pacing activities, rescue inhaler, nebulizer, apply home O2-  will provide additional printed educational material  Heart Failure Interventions: Basic overview and discussion of pathophysiology of Heart Failure reviewed; Provided education on low sodium diet; Assessed need for readable accurate scales in home; Advised patient to weigh each morning after emptying bladder; Discussed importance of daily weight and advised patient to weigh and record daily; Reviewed role of diuretics in prevention of fluid overload and management of heart failure; Discussed the importance of keeping all appointments with provider; Provided patient with education about the role of exercise in the management of heart failure; Confirmed patient monitors/ records daily weights at home: education provided around rationale for daily weights, weight gain guidelines in setting of CHF/ COPD Reviewed recent weights at home: reports daily weight ranges consistently between "113-114 lbs" with a weight today of "113 lbs" Confirmed patient currently following low salt, heart healthy diet- positive reinforcement provided  Patient Goals/Self-Care Activities: As evidenced by review of EHR, collaboration with care team, and patient reporting during CCM RN CM outreach, Patient [place] will: Self administer medications as prescribed Attend all scheduled provider appointments Call pharmacy for medication refills Call provider office for new concerns or questions Continue to follow heart healthy, low salt, low cholesterol diet Continue to monitor and write down on paper daily weights at home NOT smoke near home oxygen Review  enclosed educational material about managing COPD at home  Follow Up Plan:   Telephone follow up appointment with care management team member scheduled for:  Tuesday, November 22, at 2:00 pm The patient has been provided with contact information for the care management team and has been advised to call with any health related questions or concerns    Plan: The patient  has been provided with contact information for the care management team and has been advised to call with any health related questions or concerns  Oneta Rack, RN, BSN, [place] [phone]: direct office [phone]: mobile

## 2021-08-13 ENCOUNTER — Ambulatory Visit: Payer: Medicare Other | Admitting: Internal Medicine

## 2021-08-14 ENCOUNTER — Other Ambulatory Visit: Payer: Self-pay | Admitting: Internal Medicine

## 2021-08-14 DIAGNOSIS — F419 Anxiety disorder, unspecified: Secondary | ICD-10-CM

## 2021-08-14 DIAGNOSIS — J449 Chronic obstructive pulmonary disease, unspecified: Secondary | ICD-10-CM

## 2021-08-17 ENCOUNTER — Other Ambulatory Visit: Payer: Self-pay | Admitting: Internal Medicine

## 2021-08-17 DIAGNOSIS — I10 Essential (primary) hypertension: Secondary | ICD-10-CM

## 2021-08-17 DIAGNOSIS — F5105 Insomnia due to other mental disorder: Secondary | ICD-10-CM

## 2021-08-17 DIAGNOSIS — K5904 Chronic idiopathic constipation: Secondary | ICD-10-CM

## 2021-08-17 DIAGNOSIS — F409 Phobic anxiety disorder, unspecified: Secondary | ICD-10-CM

## 2021-08-18 ENCOUNTER — Telehealth: Payer: Self-pay | Admitting: *Deleted

## 2021-08-18 ENCOUNTER — Encounter: Payer: Self-pay | Admitting: *Deleted

## 2021-08-18 ENCOUNTER — Telehealth: Payer: Medicare Other

## 2021-08-18 NOTE — Telephone Encounter (Signed)
  Chronic Care Management   Follow Up Note   08/18/2021 Name: [name] MRN: [id number] DOB: May 01, 1946  Referred by: [name], MD Reason for referral : Chronic Care Management (CCM RN CM Follow Up Telephone Outreach- unsuccessful attempt)  An unsuccessful telephone outreach was attempted today. The patient was referred to the case management team for assistance with care management and care coordination.   Follow Up Plan:  A HIPPA compliant phone message was left for the patient providing contact information and requesting a return call Will place request with scheduling care guide to contact patient to re-schedule today's missed CCM RN follow up telephone appointment if I do not hear back from patient by end of day  Oneta Rack, RN, BSN, [place] [phone]: direct office [phone]: mobile

## 2021-08-26 DIAGNOSIS — J449 Chronic obstructive pulmonary disease, unspecified: Secondary | ICD-10-CM | POA: Diagnosis not present

## 2021-08-26 DIAGNOSIS — I5032 Chronic diastolic (congestive) heart failure: Secondary | ICD-10-CM | POA: Diagnosis not present

## 2021-08-27 ENCOUNTER — Telehealth: Payer: Self-pay | Admitting: *Deleted

## 2021-08-27 NOTE — Chronic Care Management (AMB) (Signed)
  Care Management   Note  08/27/2021 Name: [name] MRN: [id number] DOB: [birth year]  [name] is a 75 y.o. year old female who is a primary care patient of [name], MD and is actively engaged with the care management team. I reached out to [name] by phone today to assist with re-scheduling a follow up visit with the RN Case Manager  Follow up plan: Unsuccessful telephone outreach attempt made. A HIPAA compliant phone message was left for the patient providing contact information and requesting a return call.  The care management team will reach out to the patient again over the next 7 days.  If patient returns call to provider office, please advise to call [place] at [phone].  [place] Management  Direct Dial: [phone]

## 2021-08-28 NOTE — Chronic Care Management (AMB) (Signed)
  Care Management   Note  08/28/2021 Name: [name] MRN: [id number] DOB: [birth year]  [name] is a 75 y.o. year old female who is a primary care patient of [name], MD and is actively engaged with the care management team. I reached out to [name] by phone today to assist with re-scheduling a follow up visit with the RN Case Manager  Follow up plan: Unsuccessful telephone outreach attempt made. A HIPAA compliant phone message was left for the patient providing contact information and requesting a return call.  The care management team will reach out to the patient again over the next 7 days.  If patient returns call to provider office, please advise to call [place] at [phone].  [place] Management  Direct Dial: [phone]

## 2021-09-01 NOTE — Chronic Care Management (AMB) (Signed)
  Care Management   Note  09/01/2021 Name: [name] MRN: [id number] DOB: 1945-10-17  [name] is a 75 y.o. year old female who is a primary care patient of [name], MD and is actively engaged with the care management team. I reached out to [name] by phone today to assist with re-scheduling a follow up visit with the RN Case Manager  Follow up plan: Patient declines further follow up and engagement by the care management team. Appropriate care team members and provider have been notified via electronic communication.  The care management team is available to follow up with the patient after provider conversation with the patient regarding recommendation for care management engagement and subsequent re-referral to the care management team.   [place] Management  Direct Dial: [phone]

## 2021-09-01 NOTE — Chronic Care Management (AMB) (Signed)
  Care Management   Note  09/01/2021 Name: [name] MRN: [id number] DOB: 1945-10-08  [name] is a 75 y.o. year old female who is a primary care patient of [name], MD and is actively engaged with the care management team. I reached out to [name] by phone today to assist with re-scheduling a follow up visit with the RN Case Manager  Follow up plan: Unsuccessful telephone outreach attempt made. A HIPAA compliant phone message was left for the patient providing contact information and requesting a return call.  The care management team will reach out to the patient again over the next 7 days.  If patient returns call to provider office, please advise to call [place] at [phone].  [place] Management  Direct Dial: [phone]

## 2021-09-04 ENCOUNTER — Ambulatory Visit: Payer: Self-pay | Admitting: *Deleted

## 2021-09-04 DIAGNOSIS — J9611 Chronic respiratory failure with hypoxia: Secondary | ICD-10-CM

## 2021-09-04 DIAGNOSIS — I5032 Chronic diastolic (congestive) heart failure: Secondary | ICD-10-CM

## 2021-09-04 DIAGNOSIS — J449 Chronic obstructive pulmonary disease, unspecified: Secondary | ICD-10-CM

## 2021-09-04 NOTE — Chronic Care Management (AMB) (Signed)
Care Management    RN Visit Note  09/04/2021 Name: [name] MRN: [id number] DOB: 24-May-1946  Subjective: [name] is a 75 y.o. year old female who is a primary care patient of [name], MD. The care management team was consulted for assistance with disease management and care coordination needs.    Collaboration with scheduling care guide  for  case closure  in response to provider referral for case management and/or care coordination services.   Consent to Services:   Ms. Pherigo was given information about Care Management services today including:  Care Management services includes personalized support from designated clinical staff supervised by her physician, including individualized plan of care and coordination with other care providers 24/7 contact phone numbers for assistance for urgent and routine care needs. The patient may stop case management services at any time by phone call to the office staff.  Patient agreed to services and consent obtained.   Assessment: Review of patient past medical history, allergies, medications, health status, including review of consultants reports, laboratory and other test data, was performed as part of comprehensive evaluation and provision of chronic care management services.   Care Plan  Allergies  Allergen Reactions   Ceftriaxone Anaphylaxis and Other (See Comments)    *ROCEPHIN*  "Blow up like a balloon"   Hydroxyzine Shortness Of Breath and Other (See Comments)    Pt states med make her light headed, get sob sxs   Doxycycline Nausea And Vomiting   Lexapro [Escitalopram] Other (See Comments)    Pt states med make her dizzy   Outpatient Encounter Medications as of 09/04/2021  Medication Sig   ALPRAZolam (XANAX) 1 MG tablet TAKE 1 TABLET BY MOUTH 3 TIMES DAILY AS NEEDED FOR ANXIETY.   budesonide-formoterol (SYMBICORT) 160-4.5 MCG/ACT inhaler Inhale 2 puffs into the lungs in the morning and at bedtime.    butalbital-acetaminophen-caffeine (FIORICET) 50-325-40 MG tablet Take 1 tablet by mouth every 4 (four) hours as needed for headache.   cholecalciferol (VITAMIN D) 1000 units tablet Take 1,000 Units by mouth daily.   denosumab ([name]) 60 MG/ML SOSY injection Inject 60 mg into the skin every 6 (six) months.   diclofenac sodium (VOLTAREN) 1 % GEL Apply 4 g topically as needed (for pain).   fluticasone (FLONASE) 50 MCG/ACT nasal spray USE 2 SPRAYS INTO EACH NOSTRIL ONCE DAILY**REPEAT FOR 5 DAYS THEN STOP (Patient taking differently: Place 2 sprays into both nostrils daily as needed for allergies.)   furosemide (LASIX) 40 MG tablet Take 1 tablet (40 mg total) by mouth 2 (two) times daily.   ipratropium (ATROVENT HFA) 17 MCG/ACT inhaler USE 2 PUFFS EVERY FOUR HOURS AS NEEDED (Patient taking differently: Inhale 2 puffs into the lungs every 4 (four) hours as needed for wheezing.)   ipratropium-albuterol (DUONEB) 0.5-2.5 (3) MG/3ML SOLN Inhale 3 mLs into the lungs every 6 (six) hours as needed (for SOB, wheeze or cough).   KLOR-CON M20 20 MEQ tablet TAKE 1 TABLET BY MOUTH EVERY DAY   lactulose, encephalopathy, (ENULOSE) 10 GM/15ML SOLN Take 45 mLs (30 g total) by mouth 3 (three) times daily as needed (constipation).   levocetirizine (XYZAL) 5 MG tablet TAKE 1 TABLET BY MOUTH EVERY DAY IN THE EVENING (Patient taking differently: Take 5 mg by mouth every evening.)   LINZESS 72 MCG capsule TAKE 1 CAPSULE BY MOUTH DAILY BEFORE BREAKFAST.   LIVALO 2 MG TABS TAKE 1 TABLET BY MOUTH EVERY DAY   montelukast (SINGULAIR) 10 MG  tablet TAKE 1 TABLET BY MOUTH EVERYDAY AT BEDTIME (Patient taking differently: Take 10 mg by mouth at bedtime.)   nortriptyline (PAMELOR) 25 MG capsule TAKE 2 CAPSULES (50 MG TOTAL) BY MOUTH AT BEDTIME.   Omega 3 1200 MG CAPS Take 1,200 mg by mouth every morning.    OXYGEN-HELIUM IN Inhale 3 L into the lungs See admin instructions. Uses when needed during the day, uses continuous throughout the  night   pantoprazole (PROTONIX) 40 MG tablet TAKE 1 TABLET BY MOUTH EVERY DAY (Patient taking differently: Take 40 mg by mouth daily.)   polyvinyl alcohol (LIQUIFILM TEARS) 1.4 % ophthalmic solution Place 1 drop into both eyes as needed for dry eyes.   VENTOLIN HFA 108 (90 Base) MCG/ACT inhaler INHALE 1-2 PUFFS BY MOUTH EVERY 6 HOURS AS NEEDED FOR WHEEZE OR SHORTNESS OF BREATH   vitamin B-12 (CYANOCOBALAMIN) 1000 MCG tablet Take 1 tablet (1,000 mcg total) by mouth daily.   vitamin C (ASCORBIC ACID) 500 MG tablet Take 500 mg by mouth daily.   No facility-administered encounter medications on file as of 09/04/2021.   Patient Active Problem List   Diagnosis Date Noted   LRTI (lower respiratory tract infection) 06/23/2021   Acute on chronic respiratory failure with hypoxia and hypercapnia (HCC) 06/18/2021   Edema of right upper arm 04/09/2021   Atherosclerosis of aorta ([place]) 01/16/2021   Hyperglycemia 01/15/2021   Vitamin B12 deficiency anemia due to intrinsic factor deficiency 01/15/2021   Iron deficiency 01/15/2021   Hyperparathyroidism ([place]) 11/04/2020   Acute seborrheic dermatitis 10/30/2020   Chronic idiopathic constipation 05/06/2020   Small bowel obstruction ([place]) 04/22/2020   Migraine headache without aura    Chronic renal disease, stage 3, moderately decreased glomerular filtration rate (GFR) between 30-59 mL/min/1.73 square meter 03/20/2019   COPD with acute exacerbation ([place]) 08/24/2017   Chronic respiratory failure with hypoxia ([place]) 08/24/2017   Anxiety 08/24/2017   Insomnia due to anxiety and fear 08/22/2017   Oxygen dependent 08/16/2017   Mixed hyperlipidemia 03/31/2016   Other emphysema ([place]) 06/14/2014   Coronary artery disease involving native coronary artery of native heart without angina pectoris 11/06/2013   HTN (hypertension) 11/06/2013   Breast cancer of upper-outer quadrant of right female breast ([place]) 08/21/2013   Osteoporosis 01/01/2013   Chronic diastolic CHF  (congestive heart failure) ([place]) 01/31/2012   Aortic valve stenosis 08/01/2011    Class: Chronic   Conditions to be addressed/monitored:   CHF and COPD  Care Plan : RN Care Manager Plan of Care  Updates made by Knox Royalty, RN since 09/04/2021 12:00 AM     Problem: Chronic Disease Management Needs   Priority: High     Long-Range Goal: Development of plan of care for long term chronic disease management   Start Date: 08/05/2021  Expected End Date: 08/05/2022  Priority: High  Note:   Current Barriers:  Chronic Disease Management support and education needs related to CHF and COPD Recent hospitalization September 22-26, 2022 for acute on chronic Respiratory failure/ COPD/ CHF Ongoing tobacco use in setting of COPD/ CHF, use of chronic home oxygen  RNCM Clinical Goal(s):  Patient will demonstrate Ongoing health management independence for management of CHF/ COPD  through collaboration with RN Care manager, provider, and care team.   Interventions: 1:1 collaboration with primary care provider regarding development and update of comprehensive plan of care as evidenced by provider attestation and co-signature Inter-disciplinary care team collaboration (see longitudinal plan of care) Evaluation of current treatment plan  related to  self management and patient's adherence to plan as established by provider 09/04/21: case closure: notified by scheduling care guide that patient has declined ongoing participation in CCM program; case closed accordingly with completion of care plan; PCP made aware     Plan:  No further follow up required: patient has declined ongoing participation in [place], RN, BSN, [place] [phone]: direct office

## 2021-09-10 ENCOUNTER — Other Ambulatory Visit: Payer: Self-pay | Admitting: Internal Medicine

## 2021-09-10 ENCOUNTER — Ambulatory Visit: Payer: Medicare Other | Admitting: Internal Medicine

## 2021-09-10 DIAGNOSIS — J438 Other emphysema: Secondary | ICD-10-CM

## 2021-09-10 DIAGNOSIS — J301 Allergic rhinitis due to pollen: Secondary | ICD-10-CM

## 2021-09-10 DIAGNOSIS — J42 Unspecified chronic bronchitis: Secondary | ICD-10-CM

## 2021-09-15 ENCOUNTER — Other Ambulatory Visit: Payer: Self-pay | Admitting: Internal Medicine

## 2021-09-15 DIAGNOSIS — F5105 Insomnia due to other mental disorder: Secondary | ICD-10-CM

## 2021-09-16 ENCOUNTER — Ambulatory Visit (HOSPITAL_COMMUNITY)
Admission: EM | Admit: 2021-09-16 | Discharge: 2021-09-16 | Disposition: A | Discharge status: Other Acute Inpatient Hospital | Payer: Medicare Other

## 2021-09-16 ENCOUNTER — Encounter (HOSPITAL_COMMUNITY): Payer: Self-pay

## 2021-09-16 ENCOUNTER — Other Ambulatory Visit: Payer: Self-pay

## 2021-09-16 ENCOUNTER — Emergency Department (HOSPITAL_COMMUNITY)
Admission: EM | Admit: 2021-09-16 | Discharge: 2021-09-16 | Disposition: A | Discharge status: Home / Self Care | Payer: Medicare Other | Attending: Emergency Medicine | Admitting: Emergency Medicine

## 2021-09-16 ENCOUNTER — Emergency Department (HOSPITAL_COMMUNITY): Payer: Medicare Other

## 2021-09-16 DIAGNOSIS — W228XXA Striking against or struck by other objects, initial encounter: Secondary | ICD-10-CM | POA: Diagnosis not present

## 2021-09-16 DIAGNOSIS — R6889 Other general symptoms and signs: Secondary | ICD-10-CM | POA: Diagnosis not present

## 2021-09-16 DIAGNOSIS — S99912A Unspecified injury of left ankle, initial encounter: Secondary | ICD-10-CM | POA: Insufficient documentation

## 2021-09-16 DIAGNOSIS — I5033 Acute on chronic diastolic (congestive) heart failure: Secondary | ICD-10-CM | POA: Diagnosis not present

## 2021-09-16 DIAGNOSIS — L97329 Non-pressure chronic ulcer of left ankle with unspecified severity: Secondary | ICD-10-CM | POA: Diagnosis not present

## 2021-09-16 DIAGNOSIS — F17210 Nicotine dependence, cigarettes, uncomplicated: Secondary | ICD-10-CM | POA: Diagnosis not present

## 2021-09-16 DIAGNOSIS — I130 Hypertensive heart and chronic kidney disease with heart failure and stage 1 through stage 4 chronic kidney disease, or unspecified chronic kidney disease: Secondary | ICD-10-CM | POA: Diagnosis not present

## 2021-09-16 DIAGNOSIS — J449 Chronic obstructive pulmonary disease, unspecified: Secondary | ICD-10-CM | POA: Insufficient documentation

## 2021-09-16 DIAGNOSIS — I872 Venous insufficiency (chronic) (peripheral): Secondary | ICD-10-CM | POA: Diagnosis not present

## 2021-09-16 DIAGNOSIS — Z853 Personal history of malignant neoplasm of breast: Secondary | ICD-10-CM | POA: Diagnosis not present

## 2021-09-16 DIAGNOSIS — Z79899 Other long term (current) drug therapy: Secondary | ICD-10-CM | POA: Diagnosis not present

## 2021-09-16 DIAGNOSIS — I2510 Atherosclerotic heart disease of native coronary artery without angina pectoris: Secondary | ICD-10-CM | POA: Diagnosis not present

## 2021-09-16 DIAGNOSIS — N183 Chronic kidney disease, stage 3 unspecified: Secondary | ICD-10-CM | POA: Insufficient documentation

## 2021-09-16 NOTE — ED Triage Notes (Signed)
Patient presents for LFT ankle injury that occurred 1 week ago.   Patient "hit ankle on the dresser".   Patient's oxygen saturation is currently on 78%. Patient  is a chronic oxygen user and doesn't have oxygen with her.   Lavell Anchors NP at bedside.

## 2021-09-16 NOTE — ED Provider Notes (Signed)
[facility] EMERGENCY DEPARTMENT Provider Note   CSN: [id number] Arrival date & time: 09/16/21  1549     History No chief complaint on file.   [name] is a 75 y.o. female.  Presents to ER with concern for ankle wound, low oxygen.  Reports that she normally is on 3 to 4 L nasal cannula of oxygen for chronic respiratory failure but states that she left her portable oxygen at home and went to urgent care with oxygen.  Was not having any difficulty breathing or chest pain.  Her main concern is her left ankle wound.  States that about 2 weeks ago she injured her left ankle against a dresser.  Initially noticed a blood blister and this eventually popped and she developed ulcer at the site.  Couple days ago she tried wearing some sort of foods that seem to be constricting her foot.  And caused bruising around her toes.  She does not have any pain in her lower foot or toes at present.  She has not had any pus drainage, no redness around the site, no fevers or chills.    HPI     Past Medical History:  Diagnosis Date   Abnormal LFTs 08/02/2011   Acute liver failure 06/19/2013   Acute renal failure ([place]) 06/19/2013   Acute respiratory failure ([place]) 06/19/2013   Altered mental status 08/01/2011   Angina    Anxiety    Anxiety state, unspecified 12/03/2013   Aortic stenosis    mild   Arthritis    "hands" (02/03/2017)   Breast cancer, right breast ([place]) dx'd 2008   CAD (coronary artery disease) of artery bypass graft 11/06/2013   CAD (coronary artery disease), MI R/O 08/01/2011   PCI in 2006 (bare metal stent, unknown artery) - [place]    CAP (community acquired pneumonia) 09/11/2018   CHF,  acute diastolic, BNP 4k on admissio 08/01/2011   Chronic bronchitis (HCC)    Chronic respiratory failure (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   History of nuclear stress test 08/03/2011   attenuation at apex - no perfusion defects    HTN (hypertension) 11/06/2013   Hyperkalemia, on ACE prior to admission 11/11/2011   Hypertension    Hyponatremia 01/31/2012   Migraine    "qod to q couple months since I was 21" (02/03/2017)   Mild aortic stenosis 08/01/2011   AVA 1.69 cm2 (06/02/2011)    Moderate to severe pulmonary hypertension (HCC)    NSTEMI (non-ST elevated myocardial infarction) ([place]) 2006   NSVT (nonsustained ventricular tachycardia)    h/o   On home oxygen therapy    "3L just at night; have it available prn duringtheday" (09/11/2018)   Pneumonia    "alot of times" (02/03/2017)   SBO (small bowel obstruction) ([place]) 04/23/2020   PARTIAL     Patient Active Problem List   Diagnosis Date Noted   LRTI (lower respiratory tract infection) 06/23/2021  Acute on chronic respiratory failure with hypoxia and hypercapnia (HCC) 06/18/2021   Edema of right upper arm 04/09/2021   Atherosclerosis of aorta ([place]) 01/16/2021   Hyperglycemia 01/15/2021   Vitamin B12 deficiency anemia due to intrinsic factor deficiency 01/15/2021   Iron deficiency 01/15/2021   Hyperparathyroidism ([place]) 11/04/2020   Acute seborrheic dermatitis 10/30/2020   Chronic idiopathic constipation 05/06/2020   Small bowel obstruction ([place]) 04/22/2020   Migraine headache without aura    Chronic renal disease, stage 3, moderately decreased glomerular filtration rate (GFR) between 30-59 mL/min/1.73 square meter 03/20/2019   COPD with acute exacerbation ([place]) 08/24/2017   Chronic respiratory failure with hypoxia ([place]) 08/24/2017    Anxiety 08/24/2017   Insomnia due to anxiety and fear 08/22/2017   Oxygen dependent 08/16/2017   Mixed hyperlipidemia 03/31/2016   Other emphysema ([place]) 06/14/2014   Coronary artery disease involving native coronary artery of native heart without angina pectoris 11/06/2013   HTN (hypertension) 11/06/2013   Breast cancer of upper-outer quadrant of right female breast ([place]) 08/21/2013   Osteoporosis 01/01/2013   Chronic diastolic CHF (congestive heart failure) ([place]) 01/31/2012   Aortic valve stenosis 08/01/2011    Class: Chronic    Past Surgical History:  Procedure Laterality Date   BREAST BIOPSY Right 2008   BREAST LUMPECTOMY Right 2008   malignant   CATARACT EXTRACTION W/ INTRAOCULAR LENS  IMPLANT, BILATERAL Bilateral ~ 2013   [facility]; 1971; [place]  11/2007   COLOSTOMY CLOSURE  07/2008   CORONARY ANGIOPLASTY WITH STENT PLACEMENT  2006   "2 stents"   ERCP N/A 09/05/2015   Procedure: ENDOSCOPIC RETROGRADE CHOLANGIOPANCREATOGRAPHY (ERCP);  Surgeon: Ladene Artist, MD;  Location: [facility];  Service: Endoscopy;  Laterality: N/A;   PARTIAL COLECTOMY  2009   for obstruction: temporary ostomy, later reversed.    RIGHT HEART CATHETERIZATION N/A 09/05/2013   Procedure: RIGHT HEART CATH;  Surgeon: Jolaine Artist, MD;  Location: [facility] CATH LAB;  Service: Cardiovascular;  Laterality: N/A;   TRANSTHORACIC ECHOCARDIOGRAM  11/12/2011   EF 70-62%, normal systolic function, grade 1 diastolic dysfunction; ventricular septal flattening (D-sign); mild AS; trace-mild MR; LA mildly dilated; RV mod dilated; RA mod dilated; severe pulm HTN; elevated CVP     OB History   No obstetric history on file.     Family History  Problem Relation Age of Onset   Alzheimer's disease Mother    Schizophrenia Sister    Breast cancer Sister        57s   Heart disease Sister    Diabetes Sister    Hyperlipidemia Brother    Cancer Neg  Hx    Stroke Neg Hx    COPD Neg Hx    Depression Neg Hx    Drug abuse Neg Hx    Early death Neg Hx    Hypertension Neg Hx    Kidney disease Neg Hx     Social History   Tobacco Use   Smoking status: Some Days    Packs/day: 0.50    Years: 53.00    Pack years: 26.50    Types: Cigarettes   Smokeless tobacco: Never  Vaping Use   Vaping Use: Never used  Substance Use Topics   Alcohol use: Yes    Alcohol/week: 1.0 standard drink    Types: 1 Glasses of wine per week   Drug use: No    Home Medications  Prior to Admission medications   Medication Sig Start Date End Date Taking? Authorizing Provider  ALPRAZolam (XANAX) 1 MG tablet TAKE 1 TABLET BY MOUTH 3 TIMES DAILY AS NEEDED FOR ANXIETY. 08/17/21   [name], MD  butalbital-acetaminophen-caffeine (FIORICET) [phone] MG tablet Take 1 tablet by mouth every 4 (four) hours as needed for headache. 02/11/21   [name], MD  cholecalciferol (VITAMIN D) 1000 units tablet Take 1,000 Units by mouth daily.    [provider]  denosumab ([name]) 60 MG/ML SOSY injection Inject 60 mg into the skin every 6 (six) months. 09/17/20   [name], MD  diclofenac sodium (VOLTAREN) 1 % GEL Apply 4 g topically as needed (for pain). 01/14/19   [name], MD  fluticasone (FLONASE) 50 MCG/ACT nasal spray USE 2 SPRAYS INTO EACH NOSTRIL ONCE DAILY**REPEAT FOR 5 DAYS THEN STOP Patient taking differently: Place 2 sprays into both nostrils daily as needed for allergies. 04/18/16   [name], MD  furosemide (LASIX) 40 MG tablet Take 1 tablet (40 mg total) by mouth 2 (two) times daily. 08/17/21   [name], MD  ipratropium (ATROVENT HFA) 17 MCG/ACT inhaler USE 2 PUFFS EVERY FOUR HOURS AS NEEDED Patient taking differently: Inhale 2 puffs into the lungs every 4 (four) hours as needed for wheezing. 04/09/21   [name], MD  ipratropium-albuterol (DUONEB) 0.5-2.5 (3) MG/3ML SOLN Inhale 3 mLs into the lungs every 6 (six) hours  as needed (for SOB, wheeze or cough). 02/02/21   [name], MD  KLOR-CON M20 20 MEQ tablet TAKE 1 TABLET BY MOUTH EVERY DAY 08/17/21   [name], MD  lactulose, encephalopathy, (ENULOSE) 10 GM/15ML SOLN Take 45 mLs (30 g total) by mouth 3 (three) times daily as needed (constipation). 08/02/21   [name], MD  levocetirizine (XYZAL) 5 MG tablet TAKE 1 TABLET BY MOUTH EVERY DAY IN THE EVENING 09/10/21   [name], MD  LINZESS 72 MCG capsule TAKE 1 CAPSULE BY MOUTH DAILY BEFORE BREAKFAST. 08/17/21   [name], MD  LIVALO 2 MG TABS TAKE 1 TABLET BY MOUTH EVERY DAY 07/17/21   [name], MD  montelukast (SINGULAIR) 10 MG tablet TAKE 1 TABLET BY MOUTH EVERYDAY AT BEDTIME 09/10/21   [name], MD  nortriptyline (PAMELOR) 25 MG capsule TAKE 2 CAPSULES (50 MG TOTAL) BY MOUTH EVERY DAY AT BEDTIME 09/16/21   [name], MD  Omega 3 1200 MG CAPS Take 1,200 mg by mouth every morning.     [provider]  OXYGEN-HELIUM IN Inhale 3 L into the lungs See admin instructions. Uses when needed during the day, uses continuous throughout the night    [provider]  pantoprazole (PROTONIX) 40 MG tablet TAKE 1 TABLET BY MOUTH EVERY DAY Patient taking differently: Take 40 mg by mouth daily. 11/15/20   [name], MD  polyvinyl alcohol (LIQUIFILM TEARS) 1.4 % ophthalmic solution Place 1 drop into both eyes as needed for dry eyes.    [provider]  SYMBICORT 160-4.5 MCG/ACT inhaler INHALE 2 PUFFS INTO THE LUNGS IN THE MORNING AND AT BEDTIME. 09/10/21   [name], MD  VENTOLIN HFA 108 (90 Base) MCG/ACT inhaler INHALE 1-2 PUFFS BY MOUTH EVERY 6 HOURS AS NEEDED FOR WHEEZE OR SHORTNESS OF BREATH 08/14/21   [name], MD  vitamin B-12 (CYANOCOBALAMIN) 1000 MCG tablet Take 1 tablet (1,000 mcg total) by mouth daily.  12/25/20   Mercy Riding, MD  vitamin C (ASCORBIC ACID) 500 MG tablet Take 500 mg by mouth daily.    [provider]     Allergies    Ceftriaxone, Hydroxyzine, Doxycycline, and Lexapro [escitalopram]  Review of Systems   Review of Systems  Constitutional:  Negative for chills and fever.  HENT:  Negative for ear pain and sore throat.   Eyes:  Negative for pain and visual disturbance.  Respiratory:  Negative for cough and shortness of breath.   Cardiovascular:  Negative for chest pain and palpitations.  Gastrointestinal:  Negative for abdominal pain and vomiting.  Genitourinary:  Negative for dysuria and hematuria.  Musculoskeletal:  Positive for arthralgias. Negative for back pain.  Skin:  Negative for color change and rash.  Neurological:  Negative for seizures and syncope.  All other systems reviewed and are negative.  Physical Exam Updated Vital Signs BP 134/72    Pulse 71    Temp 97.8 F (36.6 C) (Oral)    Resp 16    Ht 5\' 2"  (1.575 m)    Wt 49.9 kg    SpO2 100%    BMI 20.12 kg/m   Physical Exam Vitals and nursing note reviewed.  Constitutional:      General: She is not in acute distress.    Appearance: She is well-developed.  HENT:     Head: Normocephalic and atraumatic.  Eyes:     Conjunctiva/sclera: Conjunctivae normal.  Cardiovascular:     Rate and Rhythm: Normal rate and regular rhythm.     Heart sounds: No murmur heard. Pulmonary:     Effort: Pulmonary effort is normal. No respiratory distress.     Breath sounds: Normal breath sounds.  Abdominal:     Palpations: Abdomen is soft.     Tenderness: There is no abdominal tenderness.  Musculoskeletal:     Cervical back: Neck supple.     Comments: LLE: There is approximately 0.5 cm diameter wound over the left lateral malleolus, no active bleeding noted, no overlying erythema, no purulent drainage; additionally noted ecchymosis across dorsum of all toes, foot feels appropriately warm and well-perfused, readily palpable DP and PT pulses, sensation intact throughout extremity, motor intact  Skin:    General: Skin is warm and dry.      Capillary Refill: Capillary refill takes less than 2 seconds.  Neurological:     General: No focal deficit present.     Mental Status: She is alert.  Psychiatric:        Mood and Affect: Mood normal.     Media Information Document Information  Photos  L foot  09/16/2021 18:09  Attached To:  Hospital Encounter on 09/16/21   Source Information  Rhodie Cienfuegos, Ellwood Dense, MD   [facility]    ED Results / Procedures / Treatments   Labs (all labs ordered are listed, but only abnormal results are displayed) Labs Reviewed - No data to display  EKG None  Radiology DG Ankle Complete Left  Result Date: 09/16/2021 CLINICAL DATA:  Left ankle injury EXAM: LEFT ANKLE COMPLETE - 3+ VIEW COMPARISON:  None. FINDINGS: There is no evidence of fracture, dislocation, or joint effusion. There is no evidence of arthropathy or other focal bone abnormality. Soft tissue irregularity and swelling about the lateral aspect of the lateral malleolus. IMPRESSION: 1. No acute fracture or dislocation. 2. Soft tissue edema and skin irregularity about the lateral aspect of the ankle. Electronically Signed   By: Judye Bos.O.  On: 09/16/2021 16:39   DG Foot Complete Left  Result Date: 09/16/2021 CLINICAL DATA:  Left foot injury EXAM: LEFT FOOT - COMPLETE 3+ VIEW COMPARISON:  None. FINDINGS: No acute fracture or dislocation identified. Degenerative narrowing of the first metatarsophalangeal joint with subchondral sclerosis and small osteophytes. No significant soft tissue swelling in the foot. IMPRESSION: No acute osseous abnormality identified in the foot. Electronically Signed   By: Ofilia Neas M.D.   On: 09/16/2021 16:40    Procedures Procedures   Medications Ordered in ED Medications - No data to display  ED Course  I have reviewed the triage vital signs and the nursing notes.  Pertinent labs & imaging results that were available during my care of the patient were reviewed by me and  considered in my medical decision making (see chart for details).    MDM Rules/Calculators/A&P                          75 year old lady sent from urgent care for evaluation of left foot/ankle wound as well as low oxygen.  Suspect her low oxygen was related to being off her chronic oxygen.  Patient was maintained on her chronic supplemental nasal cannula amount throughout her ER stay and had no hypoxia, she denied any cardiorespiratory complaints.  Regarding her lower extremity, she has a small wound over the lateral malleolus.  It does not appear to be acutely infected at present.  X-rays are negative.  She is neurovascularly intact.  Foot is warm and well-perfused and on serial exams, reliable pulses are present.  Suspect the blue/purple on toes is related to bruising and is not related to an ischemic foot.  I discussed the case with Dr. Stann Mainland on-call for orthopedics who recommends Xeroform dressing for now, follow-up with either Dr. Sharol Given or with podiatry for further wound care.  Reviewed return precautions and discharged home.  After the discussed management above, the patient was determined to be safe for discharge.  The patient was in agreement with this plan and all questions regarding their care were answered.  ED return precautions were discussed and the patient will return to the ED with any significant worsening of condition.     Final Clinical Impression(s) / ED Diagnoses Final diagnoses:  Injury of left ankle, initial encounter    Rx / DC Orders ED Discharge Orders     None        Lucrezia Starch, MD 09/17/21 1526

## 2021-09-16 NOTE — ED Triage Notes (Signed)
Per [facility]   Hit her foot one week ago, it is cold and blue. Pt went to UC for foot due to it getting worse over the week.  Pt normally wears 3L of O2 but did not wear it to the UC and her O2 sats were 78%. Resolved with o2  137/78 Bp 75 HR 24 RR 97% 6L

## 2021-09-16 NOTE — Discharge Instructions (Signed)
Please follow-up with either the podiatrist, Dr. Amalia Hailey or Dr. Sharol Given, orthopedic specialist who specializes with difficult wounds.  Continue current dressing for now.  Come back to ER if you develop increased drainage, fever, redness, or other new concerning symptom.

## 2021-09-16 NOTE — ED Provider Notes (Signed)
[place]    CSN: [id number] Arrival date & time: 09/16/21  1346      History   Chief Complaint No chief complaint on file.   HPI [name] is a 75 y.o. female.   HPI Patient presents today accompanied by her daughter for evaluation of an ankle injury and ankle wound that is poorly healing related to an injury that occurred over a week ago.  On arrival patient is hypoxic with an oxygen level of 75%.  Patient was immediately placed on 6 L of oxygen via nasal cannula oxygen level improved to 94 to 95% .  Oxygen was reduced  4 L.  On review of EMR patient suffers from chronic respiratory failure and should be on continuous oxygen at 3 to 4 L.  Her baseline with oxygen is 90%.  Patient reports that she has home oxygen however left portable oxygen at home. She only uses oxygen when she feels its most needed. She reports after hitting her left ankle against a dresser she noticed a few days later a large bubble which she first and the wound began to bleed.  She has been managing the wound with Band-Aids however has had increasing swelling and tenderness at the site of the wound.   Past Medical History:  Diagnosis Date   Abnormal LFTs 08/02/2011   Acute liver failure 06/19/2013   Acute renal failure ([place]) 06/19/2013   Acute respiratory failure ([place]) 06/19/2013   Altered mental status 08/01/2011   Angina    Anxiety    Anxiety state, unspecified 12/03/2013   Aortic stenosis    mild   Arthritis    "hands" (02/03/2017)   Breast cancer, right breast ([place]) dx'd 2008   CAD (coronary artery disease) of artery bypass graft 11/06/2013   CAD (coronary artery disease), MI R/O 08/01/2011   PCI in 2006 (bare metal stent, unknown artery) - [place]    CAP (community acquired pneumonia) 09/11/2018   CHF,  acute diastolic, BNP 4k on admissio 08/01/2011   Chronic bronchitis (HCC)    Chronic respiratory failure ([place]) 02/02/2012   Compression fracture of L1 lumbar vertebra ([place]) 02/02/2012   COPD  (chronic obstructive pulmonary disease) ([place])    oxygen-dependent 4LPM [place]   Coronary artery disease 2006   2 stents w/previous MI   Depressed    Diastolic CHF ([place])    Dyslipidemia    Family history of adverse reaction to anesthesia    "daughter had c-section; missed twice w/epidural"   GERD (gastroesophageal reflux disease)    Heart murmur    History of blood transfusion    "w/my colon OR"   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   History of nuclear stress test 08/03/2011   attenuation at apex - no perfusion defects    HTN (hypertension) 11/06/2013   Hyperkalemia, on ACE prior to admission 11/11/2011   Hypertension    Hyponatremia 01/31/2012   Migraine    "qod to q couple months since I was 21" (02/03/2017)   Mild aortic stenosis 08/01/2011   AVA 1.69 cm2 (06/02/2011)    Moderate to severe pulmonary hypertension (HCC)    NSTEMI (non-ST elevated myocardial infarction) ([place]) 2006   NSVT (nonsustained ventricular tachycardia)    h/o   On home oxygen therapy    "3L just at night; have it available prn duringtheday" (09/11/2018)   Pneumonia    "alot of times" (02/03/2017)   SBO (small bowel obstruction) ([place]) 04/23/2020   PARTIAL  Patient Active Problem List   Diagnosis Date Noted   LRTI (lower respiratory tract infection) 06/23/2021   Acute on chronic respiratory failure with hypoxia and hypercapnia (HCC) 06/18/2021   Edema of right upper arm 04/09/2021   Atherosclerosis of aorta ([place]) 01/16/2021   Hyperglycemia 01/15/2021   Vitamin B12 deficiency anemia due to intrinsic factor deficiency 01/15/2021   Iron deficiency 01/15/2021   Hyperparathyroidism ([place]) 11/04/2020   Acute seborrheic dermatitis 10/30/2020   Chronic idiopathic constipation 05/06/2020   Small bowel obstruction ([place]) 04/22/2020   Migraine headache without aura    Chronic renal disease, stage 3, moderately decreased glomerular filtration rate (GFR) between 30-59 mL/min/1.73 square meter 03/20/2019   COPD with  acute exacerbation ([place]) 08/24/2017   Chronic respiratory failure with hypoxia ([place]) 08/24/2017   Anxiety 08/24/2017   Insomnia due to anxiety and fear 08/22/2017   Oxygen dependent 08/16/2017   Mixed hyperlipidemia 03/31/2016   Other emphysema ([place]) 06/14/2014   Coronary artery disease involving native coronary artery of native heart without angina pectoris 11/06/2013   HTN (hypertension) 11/06/2013   Breast cancer of upper-outer quadrant of right female breast ([place]) 08/21/2013   Osteoporosis 01/01/2013   Chronic diastolic CHF (congestive heart failure) ([place]) 01/31/2012   Aortic valve stenosis 08/01/2011    Class: Chronic    Past Surgical History:  Procedure Laterality Date   BREAST BIOPSY Right 2008   BREAST LUMPECTOMY Right 2008   malignant   CATARACT EXTRACTION W/ INTRAOCULAR LENS  IMPLANT, BILATERAL Bilateral ~ 2013   [place]; 1971; [place]  11/2007   COLOSTOMY CLOSURE  07/2008   CORONARY ANGIOPLASTY WITH STENT PLACEMENT  2006   "2 stents"   ERCP N/A 09/05/2015   Procedure: ENDOSCOPIC RETROGRADE CHOLANGIOPANCREATOGRAPHY (ERCP);  Surgeon: Ladene Artist, MD;  Location: [facility];  Service: Endoscopy;  Laterality: N/A;   PARTIAL COLECTOMY  2009   for obstruction: temporary ostomy, later reversed.    RIGHT HEART CATHETERIZATION N/A 09/05/2013   Procedure: RIGHT HEART CATH;  Surgeon: Jolaine Artist, MD;  Location: [facility];  Service: Cardiovascular;  Laterality: N/A;   TRANSTHORACIC ECHOCARDIOGRAM  11/12/2011   EF 41-74%, normal systolic function, grade 1 diastolic dysfunction; ventricular septal flattening (D-sign); mild AS; trace-mild MR; LA mildly dilated; RV mod dilated; RA mod dilated; severe pulm HTN; elevated CVP    OB History   No obstetric history on file.      Home Medications    Prior to Admission medications   Medication Sig Start Date End Date Taking? Authorizing Provider   ALPRAZolam (XANAX) 1 MG tablet TAKE 1 TABLET BY MOUTH 3 TIMES DAILY AS NEEDED FOR ANXIETY. 08/17/21   [name], MD  butalbital-acetaminophen-caffeine (FIORICET) [phone] MG tablet Take 1 tablet by mouth every 4 (four) hours as needed for headache. 02/11/21   [name], MD  cholecalciferol (VITAMIN D) 1000 units tablet Take 1,000 Units by mouth daily.    [provider]  denosumab ([name]) 60 MG/ML SOSY injection Inject 60 mg into the skin every 6 (six) months. 09/17/20   [name], MD  diclofenac sodium (VOLTAREN) 1 % GEL Apply 4 g topically as needed (for pain). 01/14/19   [name], MD  fluticasone (FLONASE) 50 MCG/ACT nasal spray USE 2 SPRAYS INTO EACH NOSTRIL ONCE DAILY**REPEAT FOR 5 DAYS THEN STOP Patient taking differently: Place 2 sprays into both nostrils daily  as needed for allergies. 04/18/16   [name], MD  furosemide (LASIX) 40 MG tablet Take 1 tablet (40 mg total) by mouth 2 (two) times daily. 08/17/21   [name], MD  ipratropium (ATROVENT HFA) 17 MCG/ACT inhaler USE 2 PUFFS EVERY FOUR HOURS AS NEEDED Patient taking differently: Inhale 2 puffs into the lungs every 4 (four) hours as needed for wheezing. 04/09/21   [name], MD  ipratropium-albuterol (DUONEB) 0.5-2.5 (3) MG/3ML SOLN Inhale 3 mLs into the lungs every 6 (six) hours as needed (for SOB, wheeze or cough). 02/02/21   [name], MD  KLOR-CON M20 20 MEQ tablet TAKE 1 TABLET BY MOUTH EVERY DAY 08/17/21   [name], MD  lactulose, encephalopathy, (ENULOSE) 10 GM/15ML SOLN Take 45 mLs (30 g total) by mouth 3 (three) times daily as needed (constipation). 08/02/21   [name], MD  levocetirizine (XYZAL) 5 MG tablet TAKE 1 TABLET BY MOUTH EVERY DAY IN THE EVENING 09/10/21   [name], MD  LINZESS 72 MCG capsule TAKE 1 CAPSULE BY MOUTH DAILY BEFORE BREAKFAST. 08/17/21   [name], MD  LIVALO 2 MG TABS TAKE 1 TABLET BY MOUTH EVERY DAY 07/17/21   [name], MD  montelukast (SINGULAIR) 10 MG tablet TAKE 1 TABLET BY MOUTH EVERYDAY AT BEDTIME 09/10/21   [name], MD  nortriptyline (PAMELOR) 25 MG capsule TAKE 2 CAPSULES (50 MG TOTAL) BY MOUTH EVERY DAY AT BEDTIME 09/16/21   [name], MD  Omega 3 1200 MG CAPS Take 1,200 mg by mouth every morning.     [provider]  OXYGEN-HELIUM IN Inhale 3 L into the lungs See admin instructions. Uses when needed during the day, uses continuous throughout the night    [provider]  pantoprazole (PROTONIX) 40 MG tablet TAKE 1 TABLET BY MOUTH EVERY DAY Patient taking differently: Take 40 mg by mouth daily. 11/15/20   [name], MD  polyvinyl alcohol (LIQUIFILM TEARS) 1.4 % ophthalmic solution Place 1 drop into both eyes as needed for dry eyes.    [provider]  SYMBICORT 160-4.5 MCG/ACT inhaler INHALE 2 PUFFS INTO THE LUNGS IN THE MORNING AND AT BEDTIME. 09/10/21   [name], MD  VENTOLIN HFA 108 (90 Base) MCG/ACT inhaler INHALE 1-2 PUFFS BY MOUTH EVERY 6 HOURS AS NEEDED FOR WHEEZE OR SHORTNESS OF BREATH 08/14/21   [name], MD  vitamin B-12 (CYANOCOBALAMIN) 1000 MCG tablet Take 1 tablet (1,000 mcg total) by mouth daily. 12/25/20   Mercy Riding, MD  vitamin C (ASCORBIC ACID) 500 MG tablet Take 500 mg by mouth daily.    [provider]    Family History Family History  Problem Relation Age of Onset   Alzheimer's disease Mother    Schizophrenia Sister    Breast cancer Sister        2s   Heart disease Sister    Diabetes Sister    Hyperlipidemia Brother    Cancer Neg Hx    Stroke Neg Hx    COPD Neg Hx    Depression Neg Hx    Drug abuse Neg Hx    Early death Neg Hx    Hypertension Neg Hx    Kidney disease Neg Hx     Social History Social History   Tobacco Use   Smoking status: Some Days    Packs/day: 0.50    Years: 53.00  Pack years: 26.50    Types: Cigarettes   Smokeless tobacco: Never  Vaping Use   Vaping Use: Never  used  Substance Use Topics   Alcohol use: Yes    Alcohol/week: 1.0 standard drink    Types: 1 Glasses of wine per week   Drug use: No     Allergies   Ceftriaxone, Hydroxyzine, Doxycycline, and Lexapro [escitalopram]   Review of Systems Review of Systems   Physical Exam Triage Vital Signs ED Triage Vitals  Enc Vitals Group     BP 09/16/21 1524 122/60     Pulse Rate 09/16/21 1507 83     Resp 09/16/21 1507 (!) 24     Temp 09/16/21 1524 98.1 F (36.7 C)     Temp Source 09/16/21 1524 Oral     SpO2 09/16/21 1507 (!) 78 %     Weight --      Height --      Head Circumference --      Peak Flow --      Pain Score --      Pain Loc --      Pain Edu? --      Excl. in [place]? --    No data found.  Updated Vital Signs BP 122/60 (BP Location: Right Arm)    Pulse 83    Temp 98.1 F (36.7 C) (Oral)    Resp (!) 24    SpO2 95%   Visual Acuity Right Eye Distance:   Left Eye Distance:   Bilateral Distance:    Right Eye Near:   Left Eye Near:    Bilateral Near:     Physical Exam Cardiovascular:     Rate and Rhythm: Normal rate and regular rhythm.     Pulses:          Dorsalis pedis pulses are 1+ on the right side.     Comments: Unable to palpate left DP or PT pulse  Pulmonary:     Effort: Tachypnea and respiratory distress present.     Breath sounds: Decreased air movement present.  Musculoskeletal:     Left lower leg: 3+ Edema present.  Skin:    General: Skin is dry.     Capillary Refill: Capillary refill takes 2 to 3 seconds.     Comments: Patient also has significant discoloration of toenails and fingernails.  Patient's toes and foot are discolored with a bluish purplish hue, on the bottom of her foot has a reddish purplish hue.  Patient has delayed capillary refill in toes involving the left foot.  The toes on the left foot is cold to touch       UC Treatments / Results  Labs (all labs ordered are listed, but only abnormal results are displayed) Labs Reviewed - No  data to display  EKG   Radiology No results found.  Procedures Procedures (including critical care time)  Medications Ordered in UC Medications - No data to display  Initial Impression / Assessment and Plan / UC Course  I have reviewed the triage vital signs and the nursing notes.  Pertinent labs & imaging results that were available during my care of the patient were reviewed by me and considered in my medical decision making (see chart for details).      [facility]  Patient has peripheral vascular insufficiency and and has a wound involving the left ankle which is concerning for possible osteomyelitis and wound is poorly  healing.  Patient currently stable on 6 L of oxygen as she was on 4 L had increased back to 6 L as she was having some increased work of breathing.  Patient was transported to [facility] emergency department.  Nursing staff instructed to contact patient's daughter [name] to advise that patient has been transported to the hospital. Final Clinical Impressions(s) / UC Diagnoses   Final diagnoses:  Venous stasis ulcer of left ankle without varicose veins, unspecified ulcer stage (HCC)  Impaired perfusion of peripheral tissue   Discharge Instructions   None    ED Prescriptions   None    PDMP not reviewed this encounter.   Scot Jun, FNP 09/16/21 1600

## 2021-09-16 NOTE — ED Notes (Signed)
[name] daughter [phone] requesting a call back with an update about the xray

## 2021-09-30 ENCOUNTER — Ambulatory Visit (INDEPENDENT_AMBULATORY_CARE_PROVIDER_SITE_OTHER): Payer: Medicare Other | Admitting: Internal Medicine

## 2021-09-30 ENCOUNTER — Other Ambulatory Visit: Payer: Self-pay

## 2021-09-30 ENCOUNTER — Encounter: Payer: Self-pay | Admitting: Internal Medicine

## 2021-09-30 VITALS — BP 136/78 | HR 82 | Temp 97.6°F | Resp 16 | Ht 62.0 in | Wt 116.0 lb

## 2021-09-30 DIAGNOSIS — L089 Local infection of the skin and subcutaneous tissue, unspecified: Secondary | ICD-10-CM | POA: Diagnosis not present

## 2021-09-30 DIAGNOSIS — S81802A Unspecified open wound, left lower leg, initial encounter: Secondary | ICD-10-CM

## 2021-09-30 DIAGNOSIS — Z23 Encounter for immunization: Secondary | ICD-10-CM | POA: Diagnosis not present

## 2021-09-30 MED ORDER — SULFAMETHOXAZOLE-TRIMETHOPRIM 800-160 MG PO TABS: 1.0000 | ORAL_TABLET | Freq: Two times a day (BID) | ORAL | 0 refills | Status: AC

## 2021-09-30 NOTE — Progress Notes (Addendum)
Subjective:  Patient ID: [name], female    DOB: July 28, 1946  Age: 76 y.o. MRN: [id number]  CC: Wound Infection  This visit occurred during the SARS-CoV-2 public health emergency.  Safety protocols were in place, including screening questions prior to the visit, additional usage of staff PPE, and extensive cleaning of exam room while observing appropriate contact time as indicated for disinfecting solutions.    HPI JOURDYN HASLER presents for f/up -  She sustained a puncture wound to the left lower leg near the ankle about 2 weeks ago.  The area was punctured by a screw on a piece of furniture.  She was seen at an urgent care center and plain films were negative.  She complains that the area is not healing well.  She has pain, redness, swelling, and a few night sweats.  Outpatient Medications Prior to Visit  Medication Sig Dispense Refill   ALPRAZolam (XANAX) 1 MG tablet TAKE 1 TABLET BY MOUTH 3 TIMES DAILY AS NEEDED FOR ANXIETY. 90 tablet 2   butalbital-acetaminophen-caffeine (FIORICET) 50-325-40 MG tablet Take 1 tablet by mouth every 4 (four) hours as needed for headache. 65 tablet 3   cholecalciferol (VITAMIN D) 1000 units tablet Take 1,000 Units by mouth daily.     denosumab ([name]) 60 MG/ML SOSY injection Inject 60 mg into the skin every 6 (six) months. 1 mL 1   diclofenac sodium (VOLTAREN) 1 % GEL Apply 4 g topically as needed (for pain). 100 g 5   fluticasone (FLONASE) 50 MCG/ACT nasal spray USE 2 SPRAYS INTO EACH NOSTRIL ONCE DAILY**REPEAT FOR 5 DAYS THEN STOP (Patient taking differently: Place 2 sprays into both nostrils daily as needed for allergies.) 16 g 11   furosemide (LASIX) 40 MG tablet Take 1 tablet (40 mg total) by mouth 2 (two) times daily. 180 tablet 1   ipratropium (ATROVENT HFA) 17 MCG/ACT inhaler USE 2 PUFFS EVERY FOUR HOURS AS NEEDED (Patient taking differently: Inhale 2 puffs into the lungs every 4 (four) hours as needed for wheezing.) 77.4 each 3    ipratropium-albuterol (DUONEB) 0.5-2.5 (3) MG/3ML SOLN Inhale 3 mLs into the lungs every 6 (six) hours as needed (for SOB, wheeze or cough). 360 mL 2   KLOR-CON M20 20 MEQ tablet TAKE 1 TABLET BY MOUTH EVERY DAY 90 tablet 1   lactulose, encephalopathy, (ENULOSE) 10 GM/15ML SOLN Take 45 mLs (30 g total) by mouth 3 (three) times daily as needed (constipation). 1892 mL 0   levocetirizine (XYZAL) 5 MG tablet TAKE 1 TABLET BY MOUTH EVERY DAY IN THE EVENING 90 tablet 1   LINZESS 72 MCG capsule TAKE 1 CAPSULE BY MOUTH DAILY BEFORE BREAKFAST. 90 capsule 1   LIVALO 2 MG TABS TAKE 1 TABLET BY MOUTH EVERY DAY 90 tablet 1   montelukast (SINGULAIR) 10 MG tablet TAKE 1 TABLET BY MOUTH EVERYDAY AT BEDTIME 90 tablet 1   nortriptyline (PAMELOR) 25 MG capsule TAKE 2 CAPSULES (50 MG TOTAL) BY MOUTH EVERY DAY AT BEDTIME 180 capsule 0   Omega 3 1200 MG CAPS Take 1,200 mg by mouth every morning.      OXYGEN-HELIUM IN Inhale 3 L into the lungs See admin instructions. Uses when needed during the day, uses continuous throughout the night     pantoprazole (PROTONIX) 40 MG tablet TAKE 1 TABLET BY MOUTH EVERY DAY (Patient taking differently: Take 40 mg by mouth daily.) 90 tablet 1   polyvinyl alcohol (LIQUIFILM TEARS) 1.4 % ophthalmic solution Place 1  drop into both eyes as needed for dry eyes.     SYMBICORT 160-4.5 MCG/ACT inhaler INHALE 2 PUFFS INTO THE LUNGS IN THE MORNING AND AT BEDTIME. 30.6 each 1   VENTOLIN HFA 108 (90 Base) MCG/ACT inhaler INHALE 1-2 PUFFS BY MOUTH EVERY 6 HOURS AS NEEDED FOR WHEEZE OR SHORTNESS OF BREATH 36 each 2   vitamin B-12 (CYANOCOBALAMIN) 1000 MCG tablet Take 1 tablet (1,000 mcg total) by mouth daily. 90 tablet 1   vitamin C (ASCORBIC ACID) 500 MG tablet Take 500 mg by mouth daily.     No facility-administered medications prior to visit.    ROS Review of Systems  Constitutional:  Negative for chills, diaphoresis, fatigue and fever.  HENT: Negative.    Respiratory:  Positive for cough,  shortness of breath and wheezing. Negative for chest tightness.   Cardiovascular:  Negative for chest pain, palpitations and leg swelling.  Gastrointestinal:  Negative for abdominal pain, constipation, diarrhea, nausea and vomiting.  Genitourinary: Negative.  Negative for difficulty urinating.  Musculoskeletal:  Negative for back pain, joint swelling and myalgias.  Skin:  Positive for color change and wound. Negative for rash.  Neurological: Negative.  Negative for dizziness and numbness.  Hematological:  Negative for adenopathy. Does not bruise/bleed easily.  Psychiatric/Behavioral: Negative.     Objective:  BP 136/78 (BP Location: Left Arm, Patient Position: Sitting, Cuff Size: Normal)    Pulse 82    Temp 97.6 F (36.4 C) (Oral)    Resp 16    Ht 5\' 2"  (1.575 m)    Wt 116 lb (52.6 kg)    SpO2 93%    BMI 21.22 kg/m   BP Readings from Last 3 Encounters:  09/30/21 136/78  09/16/21 134/72  09/16/21 122/60    Wt Readings from Last 3 Encounters:  09/30/21 116 lb (52.6 kg)  09/16/21 110 lb (49.9 kg)  07/16/21 116 lb (52.6 kg)    Physical Exam Vitals reviewed.  Constitutional:      Appearance: Normal appearance.  HENT:     Nose: Nose normal.     Mouth/Throat:     Mouth: Mucous membranes are moist.  Eyes:     General: No scleral icterus. Cardiovascular:     Rate and Rhythm: Normal rate and regular rhythm.     Heart sounds: Murmur heard.    No gallop.  Pulmonary:     Effort: Accessory muscle usage present. No respiratory distress.     Breath sounds: Examination of the right-middle field reveals wheezing and rhonchi. Examination of the left-middle field reveals wheezing and rhonchi. Examination of the right-lower field reveals wheezing and rhonchi. Examination of the left-lower field reveals wheezing and rhonchi. Wheezing and rhonchi present. No decreased breath sounds or rales.  Abdominal:     General: Abdomen is flat.     Palpations: There is no mass.     Tenderness: There is  no abdominal tenderness. There is no guarding.     Hernia: No hernia is present.  Musculoskeletal:        General: Normal range of motion.     Cervical back: Neck supple.     Right lower leg: No edema.     Left lower leg: No edema.     Comments: Near the lateral malleolus there is a 2 cm puncture wound with surrounding erythema, warmth, and peeling.  There is no purulent exudate, induration, fluctuance, or formed abscess.  The cavity is probed with a Q-tip and there is no deep tracking.  See photo.  Lymphadenopathy:     Cervical: No cervical adenopathy.  Skin:    General: Skin is warm and dry.     Findings: Erythema present.  Neurological:     General: No focal deficit present.     Mental Status: She is alert.    Lab Results  Component Value Date   WBC 8.2 06/22/2021   HGB 12.6 06/22/2021   HCT 38.6 06/22/2021   PLT 196 06/22/2021   GLUCOSE 90 06/22/2021   CHOL 187 04/15/2021   TRIG 136.0 04/15/2021   HDL 66.80 04/15/2021   LDLCALC 93 04/15/2021   ALT 15 06/19/2021   AST 19 06/19/2021   NA 135 06/22/2021   K 3.6 06/22/2021   CL 92 (L) 06/22/2021   CREATININE 0.86 06/22/2021   BUN 19 06/22/2021   CO2 36 (H) 06/22/2021   TSH 2.01 04/15/2021   INR 1.0 12/23/2020   HGBA1C 5.1 01/15/2021    DG Ankle Complete Left  Result Date: 09/16/2021 CLINICAL DATA:  Left ankle injury EXAM: LEFT ANKLE COMPLETE - 3+ VIEW COMPARISON:  None. FINDINGS: There is no evidence of fracture, dislocation, or joint effusion. There is no evidence of arthropathy or other focal bone abnormality. Soft tissue irregularity and swelling about the lateral aspect of the lateral malleolus. IMPRESSION: 1. No acute fracture or dislocation. 2. Soft tissue edema and skin irregularity about the lateral aspect of the ankle. Electronically Signed   By: Keane Police D.O.   On: 09/16/2021 16:39   DG Foot Complete Left  Result Date: 09/16/2021 CLINICAL DATA:  Left foot injury EXAM: LEFT FOOT - COMPLETE 3+ VIEW  COMPARISON:  None. FINDINGS: No acute fracture or dislocation identified. Degenerative narrowing of the first metatarsophalangeal joint with subchondral sclerosis and small osteophytes. No significant soft tissue swelling in the foot. IMPRESSION: No acute osseous abnormality identified in the foot. Electronically Signed   By: Ofilia Neas M.D.   On: 09/16/2021 16:40  Pseudomonas aeruginosa Staphylococcus aureus      AEROBIC CULT, GRAM STAIN NEGATIVE 1 AEROBIC CULT, GRAM STAIN POSITIVE 2    CEFEPIME 4  Sensitive      CEFTAZIDIME 4  Sensitive      CIPROFLOXACIN <=0.25  Sensitive <=0.5  Sensitive    CLINDAMYCIN   <=0.25  Sensitive    ERYTHROMYCIN   >=8  Resistant    GENTAMICIN 2  Sensitive <=0.5  Sensitive 1    IMIPENEM 2  Sensitive      LEVOFLOXACIN 1  Sensitive <=0.12  Sensitive    OXACILLIN   <=0.25  Sensitive 2    PIP/TAZO 8  Sensitive      TETRACYCLINE   >=16  Resistant    TOBRAMYCIN <=1  Sensitive      TRIMETH/SULFA   <=10  Sensitive    VANCOMYCIN   1  Sensitive               Assessment & Plan:   Kishana was seen today for wound infection.  Diagnoses and all orders for this visit:  Traumatic open wound of lower leg with infection, left, initial encounter- The culture is positive for Pseudomonas and staph aureus.  Will treat with a combination of Bactrim DS and levofloxacin. -     WOUND CULTURE; Future -     sulfamethoxazole-trimethoprim (BACTRIM DS) 800-160 MG tablet; Take 1 tablet by mouth 2 (two) times daily for 10 days. -     Ambulatory referral to Wound Clinic -  WOUND CULTURE -     levofloxacin (LEVAQUIN) 500 MG tablet; Take 1 tablet (500 mg total) by mouth daily for 7 days.  Other orders -     Tdap vaccine greater than or equal to 7yo IM   I am having [name] "Cameron Ali" start on sulfamethoxazole-trimethoprim and levofloxacin. I am also having her maintain her OXYGEN-HELIUM IN, Omega 3, cholecalciferol, vitamin C, fluticasone, diclofenac sodium,  [name], pantoprazole, vitamin B-12, ipratropium-albuterol, butalbital-acetaminophen-caffeine, Atrovent HFA, polyvinyl alcohol, Livalo, lactulose (encephalopathy), ALPRAZolam, Ventolin HFA, furosemide, [place]-Con M20, Linzess, levocetirizine, montelukast, Symbicort, and nortriptyline.  Meds ordered this encounter  Medications   sulfamethoxazole-trimethoprim (BACTRIM DS) 800-160 MG tablet    Sig: Take 1 tablet by mouth 2 (two) times daily for 10 days.    Dispense:  20 tablet    Refill:  0   levofloxacin (LEVAQUIN) 500 MG tablet    Sig: Take 1 tablet (500 mg total) by mouth daily for 7 days.    Dispense:  7 tablet    Refill:  0     Follow-up: Return in about 5 days (around 10/05/2021).  Scarlette Calico, MD

## 2021-09-30 NOTE — Patient Instructions (Signed)
Wound Infection A wound infection happens when tiny organisms (microorganisms) start to grow in a wound. A wound infection is most often caused by bacteria. Infection can cause the wound to break open or worsen. Wound infection needs treatment. If a wound infection is left untreated, complications can occur. Untreated wound infections may lead to an infection in the bloodstream (septicemia) or a bone infection (osteomyelitis). What are the causes? This condition is most often caused by bacteria growing in a wound. Other microorganisms, like yeast and fungi, can also cause wound infections. What increases the risk? The following factors may make you more likely to develop this condition: Having a weak body defense system (immune system). Having diabetes. Taking steroid medicines for a long time (chronic use). Smoking. Being an older person. Being overweight. Taking chemotherapy medicines. What are the signs or symptoms? Symptoms of this condition include: Having more redness, swelling, or pain at the wound site. Having more blood or fluid at the wound site. A bad smell coming from a wound or bandage (dressing). Having a fever. Feeling tired or fatigued. Having warmth at or around the wound. Having pus at the wound site. How is this diagnosed? This condition is diagnosed with a medical history and physical exam. You may also have a wound culture or blood tests or both. How is this treated? This condition is usually treated with an antibiotic medicine. The infection should improve 24-48 hours after you start antibiotics. After 24-48 hours, redness around the wound should stop spreading, and the wound should be less painful. Follow these instructions at home: Medicines Take or apply over-the-counter and prescription medicines only as told by your health care provider. If you were prescribed an antibiotic medicine, take or apply it as told by your health care provider. Do not stop using the  antibiotic even if you start to feel better. Wound care  Clean the wound each day, or as told by your health care provider. Wash the wound with mild soap and water. Rinse the wound with water to remove all soap. Pat the wound dry with a clean towel. Do not rub it. Follow instructions from your health care provider about how to take care of your wound. Make sure you: Wash your hands with soap and water before and after you change your dressing. If soap and water are not available, use hand sanitizer. Change your dressing as told by your health care provider. Leave stitches (sutures), skin glue, or adhesive strips in place if your wound has been closed. These skin closures may need to stay in place for 2 weeks or longer. If adhesive strip edges start to loosen and curl up, you may trim the loose edges. Do not remove adhesive strips completely unless your health care provider tells you to do that. Some wounds are left open to heal on their own. Check your wound every day for signs of infection. Watch for: More redness, swelling, or pain. More fluid or blood. Warmth. Pus or a bad smell. General instructions Keep the dressing dry until your health care provider says it can be removed. Do not take baths, swim, or use a hot tub until your health care provider approves. Ask your health care provider if you may take showers. You may only be allowed to take sponge baths. Raise (elevate) the injured area above the level of your heart while you are sitting or lying down. Do not scratch or pick at the wound. Keep all follow-up visits as told by your health care  provider. This is important. Contact a health care provider if: Your pain is not controlled with medicine. You have more redness, swelling, or pain around your wound. You have more fluid or blood coming from your wound. Your wound feels warm to the touch. You have pus coming from your wound. You continue to notice a bad smell coming from your  wound or your dressing. Your wound that was closed breaks open. Get help right away if: You have a red streak going away from your wound. You have a fever. Summary A wound infection happens when tiny organisms (microorganisms) start to grow in a wound. This condition is usually treated with an antibiotic medicine. Follow instructions from your health care provider about how to take care of your wound. Contact a health care provider if your wound infection does not begin to improve in 24-48 hours, or your symptoms worsen. Keep all follow-up visits as told by your health care provider. This is important. This information is not intended to replace advice given to you by your health care provider. Make sure you discuss any questions you have with your health care provider. Document Revised: 04/25/2018 Document Reviewed: 04/25/2018 Elsevier Patient Education  [place].

## 2021-10-04 LAB — WOUND CULTURE

## 2021-10-04 MED ORDER — LEVOFLOXACIN 500 MG PO TABS: 500.0000 mg | ORAL_TABLET | Freq: Every day | ORAL | 0 refills | Status: AC

## 2021-10-07 ENCOUNTER — Other Ambulatory Visit: Payer: Self-pay

## 2021-10-07 ENCOUNTER — Ambulatory Visit (INDEPENDENT_AMBULATORY_CARE_PROVIDER_SITE_OTHER): Payer: Medicare Other | Admitting: Internal Medicine

## 2021-10-07 ENCOUNTER — Encounter: Payer: Self-pay | Admitting: Internal Medicine

## 2021-10-07 VITALS — BP 116/72 | HR 90 | Temp 97.8°F | Resp 20 | Ht 62.0 in | Wt 115.0 lb

## 2021-10-07 DIAGNOSIS — L089 Local infection of the skin and subcutaneous tissue, unspecified: Secondary | ICD-10-CM | POA: Diagnosis not present

## 2021-10-07 DIAGNOSIS — M810 Age-related osteoporosis without current pathological fracture: Secondary | ICD-10-CM | POA: Diagnosis not present

## 2021-10-07 DIAGNOSIS — J9611 Chronic respiratory failure with hypoxia: Secondary | ICD-10-CM | POA: Diagnosis not present

## 2021-10-07 DIAGNOSIS — S81802A Unspecified open wound, left lower leg, initial encounter: Secondary | ICD-10-CM

## 2021-10-07 NOTE — Progress Notes (Signed)
Subjective:  Patient ID: [name], female    DOB: 1946-07-18  Age: 76 y.o. MRN: [id number]  CC: COPD and Wound Check  This visit occurred during the SARS-CoV-2 public health emergency.  Safety protocols were in place, including screening questions prior to the visit, additional usage of staff PPE, and extensive cleaning of exam room while observing appropriate contact time as indicated for disinfecting solutions.    HPI GENIEVE RAMASWAMY presents for f/up -   She is not taking bactrim-ds because it caused N/V - those symptoms have resolved. The wound over her left ankle is healing. There is no pain/swelling/drainage. The area itches.  Outpatient Medications Prior to Visit  Medication Sig Dispense Refill   ALPRAZolam (XANAX) 1 MG tablet TAKE 1 TABLET BY MOUTH 3 TIMES DAILY AS NEEDED FOR ANXIETY. 90 tablet 2   butalbital-acetaminophen-caffeine (FIORICET) 50-325-40 MG tablet Take 1 tablet by mouth every 4 (four) hours as needed for headache. 65 tablet 3   cholecalciferol (VITAMIN D) 1000 units tablet Take 1,000 Units by mouth daily.     denosumab ([name]) 60 MG/ML SOSY injection Inject 60 mg into the skin every 6 (six) months. 1 mL 1   diclofenac sodium (VOLTAREN) 1 % GEL Apply 4 g topically as needed (for pain). 100 g 5   fluticasone (FLONASE) 50 MCG/ACT nasal spray USE 2 SPRAYS INTO EACH NOSTRIL ONCE DAILY**REPEAT FOR 5 DAYS THEN STOP (Patient taking differently: Place 2 sprays into both nostrils daily as needed for allergies.) 16 g 11   furosemide (LASIX) 40 MG tablet Take 1 tablet (40 mg total) by mouth 2 (two) times daily. 180 tablet 1   ipratropium (ATROVENT HFA) 17 MCG/ACT inhaler USE 2 PUFFS EVERY FOUR HOURS AS NEEDED (Patient taking differently: Inhale 2 puffs into the lungs every 4 (four) hours as needed for wheezing.) 77.4 each 3   ipratropium-albuterol (DUONEB) 0.5-2.5 (3) MG/3ML SOLN Inhale 3 mLs into the lungs every 6 (six) hours as needed (for SOB, wheeze or cough). 360 mL  2   KLOR-CON M20 20 MEQ tablet TAKE 1 TABLET BY MOUTH EVERY DAY 90 tablet 1   lactulose, encephalopathy, (ENULOSE) 10 GM/15ML SOLN Take 45 mLs (30 g total) by mouth 3 (three) times daily as needed (constipation). 1892 mL 0   levocetirizine (XYZAL) 5 MG tablet TAKE 1 TABLET BY MOUTH EVERY DAY IN THE EVENING 90 tablet 1   levofloxacin (LEVAQUIN) 500 MG tablet Take 1 tablet (500 mg total) by mouth daily for 7 days. 7 tablet 0   LINZESS 72 MCG capsule TAKE 1 CAPSULE BY MOUTH DAILY BEFORE BREAKFAST. 90 capsule 1   LIVALO 2 MG TABS TAKE 1 TABLET BY MOUTH EVERY DAY 90 tablet 1   montelukast (SINGULAIR) 10 MG tablet TAKE 1 TABLET BY MOUTH EVERYDAY AT BEDTIME 90 tablet 1   nortriptyline (PAMELOR) 25 MG capsule TAKE 2 CAPSULES (50 MG TOTAL) BY MOUTH EVERY DAY AT BEDTIME 180 capsule 0   Omega 3 1200 MG CAPS Take 1,200 mg by mouth every morning.      OXYGEN-HELIUM IN Inhale 3 L into the lungs See admin instructions. Uses when needed during the day, uses continuous throughout the night     pantoprazole (PROTONIX) 40 MG tablet TAKE 1 TABLET BY MOUTH EVERY DAY (Patient taking differently: Take 40 mg by mouth daily.) 90 tablet 1   polyvinyl alcohol (LIQUIFILM TEARS) 1.4 % ophthalmic solution Place 1 drop into both eyes as needed for dry eyes.  sulfamethoxazole-trimethoprim (BACTRIM DS) 800-160 MG tablet Take 1 tablet by mouth 2 (two) times daily for 10 days. 20 tablet 0   SYMBICORT 160-4.5 MCG/ACT inhaler INHALE 2 PUFFS INTO THE LUNGS IN THE MORNING AND AT BEDTIME. 30.6 each 1   VENTOLIN HFA 108 (90 Base) MCG/ACT inhaler INHALE 1-2 PUFFS BY MOUTH EVERY 6 HOURS AS NEEDED FOR WHEEZE OR SHORTNESS OF BREATH 36 each 2   vitamin B-12 (CYANOCOBALAMIN) 1000 MCG tablet Take 1 tablet (1,000 mcg total) by mouth daily. 90 tablet 1   vitamin C (ASCORBIC ACID) 500 MG tablet Take 500 mg by mouth daily.     No facility-administered medications prior to visit.    ROS Review of Systems  Constitutional:  Negative for  chills, diaphoresis, fatigue and fever.  HENT: Negative.    Eyes: Negative.   Respiratory:  Positive for shortness of breath and wheezing. Negative for cough and chest tightness.   Cardiovascular:  Negative for chest pain, palpitations and leg swelling.  Gastrointestinal:  Negative for abdominal pain, diarrhea and nausea.  Endocrine: Negative.   Genitourinary: Negative.  Negative for difficulty urinating and dysuria.  Musculoskeletal: Negative.   Skin:  Positive for wound.  Neurological:  Negative for dizziness, weakness and headaches.  Hematological:  Negative for adenopathy. Does not bruise/bleed easily.  Psychiatric/Behavioral: Negative.     Objective:  BP 116/72 (BP Location: Left Arm, Patient Position: Sitting, Cuff Size: Normal)    Pulse 90    Temp 97.8 F (36.6 C) (Oral)    Resp 20    Ht 5\' 2"  (1.575 m)    Wt 115 lb (52.2 kg)    SpO2 96%    BMI 21.03 kg/m   BP Readings from Last 3 Encounters:  10/07/21 116/72  09/30/21 136/78  09/16/21 134/72    Wt Readings from Last 3 Encounters:  10/07/21 115 lb (52.2 kg)  09/30/21 116 lb (52.6 kg)  09/16/21 110 lb (49.9 kg)    Physical Exam Vitals reviewed.  HENT:     Nose: Nose normal.     Mouth/Throat:     Mouth: Mucous membranes are moist.  Eyes:     General: No scleral icterus.    Conjunctiva/sclera: Conjunctivae normal.  Cardiovascular:     Rate and Rhythm: Normal rate.     Heart sounds: Murmur heard.    No gallop.  Pulmonary:     Effort: Accessory muscle usage present. No respiratory distress.     Breath sounds: Examination of the right-middle field reveals wheezing. Examination of the left-middle field reveals wheezing. Examination of the right-lower field reveals wheezing. Examination of the left-lower field reveals wheezing. Wheezing present. No rhonchi or rales.  Abdominal:     General: Abdomen is flat.     Tenderness: There is no abdominal tenderness.  Musculoskeletal:        General: Normal range of motion.      Cervical back: Neck supple.     Right ankle: Normal range of motion.     Left ankle: Normal range of motion.     Comments: The wound over the left lateral malleolus has closed and is granulating. There is no abscess, exudate, warmth, tenderness, or streaking.   Lymphadenopathy:     Cervical: No cervical adenopathy.  Skin:    General: Skin is warm and dry.  Neurological:     General: No focal deficit present.    Lab Results  Component Value Date   WBC 8.2 06/22/2021   HGB 12.6 06/22/2021  HCT 38.6 06/22/2021   PLT 196 06/22/2021   GLUCOSE 90 06/22/2021   CHOL 187 04/15/2021   TRIG 136.0 04/15/2021   HDL 66.80 04/15/2021   LDLCALC 93 04/15/2021   ALT 15 06/19/2021   AST 19 06/19/2021   NA 135 06/22/2021   K 3.6 06/22/2021   CL 92 (L) 06/22/2021   CREATININE 0.86 06/22/2021   BUN 19 06/22/2021   CO2 36 (H) 06/22/2021   TSH 2.01 04/15/2021   INR 1.0 12/23/2020   HGBA1C 5.1 01/15/2021    DG Ankle Complete Left  Result Date: 09/16/2021 CLINICAL DATA:  Left ankle injury EXAM: LEFT ANKLE COMPLETE - 3+ VIEW COMPARISON:  None. FINDINGS: There is no evidence of fracture, dislocation, or joint effusion. There is no evidence of arthropathy or other focal bone abnormality. Soft tissue irregularity and swelling about the lateral aspect of the lateral malleolus. IMPRESSION: 1. No acute fracture or dislocation. 2. Soft tissue edema and skin irregularity about the lateral aspect of the ankle. Electronically Signed   By: Keane Police D.O.   On: 09/16/2021 16:39   DG Foot Complete Left  Result Date: 09/16/2021 CLINICAL DATA:  Left foot injury EXAM: LEFT FOOT - COMPLETE 3+ VIEW COMPARISON:  None. FINDINGS: No acute fracture or dislocation identified. Degenerative narrowing of the first metatarsophalangeal joint with subchondral sclerosis and small osteophytes. No significant soft tissue swelling in the foot. IMPRESSION: No acute osseous abnormality identified in the foot. Electronically  Signed   By: Ofilia Neas M.D.   On: 09/16/2021 16:40    Assessment & Plan:   Lucy was seen today for copd and wound check.  Diagnoses and all orders for this visit:  Chronic respiratory failure with hypoxia ([place])- She is well compensated. -     For home use only DME Other see comment  Traumatic open wound of lower leg with infection, left, initial encounter- Improvement noted. Levaquin will cover the staph and pseudomonas.  Osteoporosis, unspecified osteoporosis type, unspecified pathological fracture presence -     For home use only DME Other see comment   I am having [name] "Cameron Ali" maintain her OXYGEN-HELIUM IN, Omega 3, cholecalciferol, vitamin C, fluticasone, diclofenac sodium, [name], pantoprazole, vitamin B-12, ipratropium-albuterol, butalbital-acetaminophen-caffeine, Atrovent HFA, polyvinyl alcohol, Livalo, lactulose (encephalopathy), ALPRAZolam, Ventolin HFA, furosemide, [place]-Con M20, Linzess, levocetirizine, montelukast, Symbicort, nortriptyline, sulfamethoxazole-trimethoprim, and levofloxacin.  No orders of the defined types were placed in this encounter.    Follow-up: No follow-ups on file.  Scarlette Calico, MD

## 2021-10-08 ENCOUNTER — Telehealth: Payer: Self-pay | Admitting: Internal Medicine

## 2021-10-08 NOTE — Telephone Encounter (Signed)
Codes added and faxed to [facility]

## 2021-10-08 NOTE — Telephone Encounter (Signed)
[employer] calling in  Says the received order for patient rollator walker but order was missing the diagnoses code which is required by medicare  Please refax order to [phone]

## 2021-10-17 ENCOUNTER — Other Ambulatory Visit: Payer: Self-pay | Admitting: Internal Medicine

## 2021-10-17 DIAGNOSIS — G43009 Migraine without aura, not intractable, without status migrainosus: Secondary | ICD-10-CM

## 2021-10-20 ENCOUNTER — Telehealth: Payer: Self-pay

## 2021-10-20 NOTE — Telephone Encounter (Signed)
[name] is calling requesting a new Rx for refills for: denosumab ([name]) 60 MG/ML SOSY injection  Pharmacy: [facility] [place] 10/07/21

## 2021-10-21 ENCOUNTER — Other Ambulatory Visit: Payer: Self-pay | Admitting: Internal Medicine

## 2021-10-21 DIAGNOSIS — M810 Age-related osteoporosis without current pathological fracture: Secondary | ICD-10-CM

## 2021-10-21 MED ORDER — PROLIA 60 MG/ML ~~LOC~~ SOSY: 60.0000 mg | PREFILLED_SYRINGE | SUBCUTANEOUS | 1 refills | Status: DC

## 2021-10-23 ENCOUNTER — Other Ambulatory Visit: Payer: Self-pay | Admitting: Internal Medicine

## 2021-10-23 DIAGNOSIS — J449 Chronic obstructive pulmonary disease, unspecified: Secondary | ICD-10-CM

## 2021-11-01 ENCOUNTER — Other Ambulatory Visit: Payer: Self-pay | Admitting: Internal Medicine

## 2021-11-01 DIAGNOSIS — F5105 Insomnia due to other mental disorder: Secondary | ICD-10-CM

## 2021-11-01 DIAGNOSIS — F409 Phobic anxiety disorder, unspecified: Secondary | ICD-10-CM

## 2021-11-03 ENCOUNTER — Telehealth: Payer: Self-pay

## 2021-11-03 NOTE — Telephone Encounter (Signed)
Lanelle Bal is calling to to set up delivery for [name] for the pt. \  CB [phone]

## 2021-11-09 ENCOUNTER — Telehealth: Payer: Self-pay

## 2021-11-09 NOTE — Telephone Encounter (Signed)
Pts prolia will be delivered on 11/11/2021.

## 2021-11-09 NOTE — Telephone Encounter (Signed)
[name] VOB initiated via [email or web address]  Last OV:  Next OV:  Last [name] inj: 05/12/21 Next [name] inj DUE: 11/13/21

## 2021-11-11 NOTE — Telephone Encounter (Signed)
[name] received today from pharmacy. Called and left message for patient to return call to clinic to schedule.

## 2021-11-13 NOTE — Telephone Encounter (Signed)
Pt checking status of prolia injection, informed pt per cmas note below, cma LVM that injection was received on 2-15  Pt states she will call back to schedule an appt

## 2021-11-17 ENCOUNTER — Telehealth: Payer: Self-pay

## 2021-11-17 ENCOUNTER — Other Ambulatory Visit: Payer: Self-pay | Admitting: Internal Medicine

## 2021-11-17 DIAGNOSIS — M159 Polyosteoarthritis, unspecified: Secondary | ICD-10-CM | POA: Insufficient documentation

## 2021-11-17 DIAGNOSIS — F419 Anxiety disorder, unspecified: Secondary | ICD-10-CM

## 2021-11-17 DIAGNOSIS — M150 Primary generalized (osteo)arthritis: Secondary | ICD-10-CM | POA: Insufficient documentation

## 2021-11-17 MED ORDER — ALPRAZOLAM 1 MG PO TABS: 1.0000 mg | ORAL_TABLET | Freq: Three times a day (TID) | ORAL | 3 refills | Status: DC | PRN

## 2021-11-17 MED ORDER — DICLOFENAC SODIUM 1 % EX GEL: 2.0000 g | Freq: Four times a day (QID) | CUTANEOUS | 1 refills | Status: DC

## 2021-11-17 NOTE — Telephone Encounter (Signed)
Pt is requesting a refill on: ALPRAZolam (XANAX) 1 MG tablet diclofenac sodium (VOLTAREN) 1 % GEL  Pharmacy: [id number] - [place], [place] - [address] AT [place]  LOV: 10/07/21  Pt CB [phone]

## 2021-11-18 ENCOUNTER — Other Ambulatory Visit: Payer: Self-pay

## 2021-11-18 ENCOUNTER — Ambulatory Visit (INDEPENDENT_AMBULATORY_CARE_PROVIDER_SITE_OTHER): Payer: Medicare Other

## 2021-11-18 DIAGNOSIS — M810 Age-related osteoporosis without current pathological fracture: Secondary | ICD-10-CM

## 2021-11-18 MED ORDER — DENOSUMAB 60 MG/ML ~~LOC~~ SOSY
60.0000 mg | PREFILLED_SYRINGE | Freq: Once | SUBCUTANEOUS | Status: AC
  Administered 2021-11-18: 60 mg via SUBCUTANEOUS

## 2021-11-18 NOTE — Progress Notes (Signed)
Pt was given [name] injection w/o any complications. °

## 2021-11-20 ENCOUNTER — Other Ambulatory Visit: Payer: Self-pay | Admitting: Internal Medicine

## 2021-11-20 DIAGNOSIS — I10 Essential (primary) hypertension: Secondary | ICD-10-CM

## 2021-11-20 DIAGNOSIS — K5904 Chronic idiopathic constipation: Secondary | ICD-10-CM

## 2021-11-24 NOTE — Telephone Encounter (Signed)
Pt ready for scheduling on or after 11/13/21  Out-of-pocket cost due at time of visit: $0  Primary: Medicare [name] co-insurance: 0% Admin fee co-insurance: 0%  Secondary: Medicaid [name] co-insurance: 0% Admin fee co-insurance: 0%  Deductible: does not apply  Prior Auth: not required PA# Valid:     ** This summary of benefits is an estimation of the patient's out-of-pocket cost. Exact cost may very based on individual plan coverage.

## 2021-11-24 NOTE — Telephone Encounter (Signed)
Last [name] inj 11/18/21 Next [name] inj due 05/19/22

## 2021-12-02 ENCOUNTER — Other Ambulatory Visit: Payer: Self-pay

## 2021-12-02 ENCOUNTER — Ambulatory Visit (HOSPITAL_COMMUNITY): Payer: Medicare Other | Attending: Internal Medicine

## 2021-12-02 DIAGNOSIS — I10 Essential (primary) hypertension: Secondary | ICD-10-CM

## 2021-12-02 DIAGNOSIS — I350 Nonrheumatic aortic (valve) stenosis: Secondary | ICD-10-CM | POA: Diagnosis present

## 2021-12-02 LAB — ECHOCARDIOGRAM COMPLETE
AR max vel: 0.85 cm2
AV Area VTI: 0.82 cm2
AV Area mean vel: 0.76 cm2
AV Mean grad: 26 mmHg
AV Peak grad: 42.8 mmHg
Ao pk vel: 3.27 m/s
Area-P 1/2: 4.83 cm2
S' Lateral: 3 cm

## 2021-12-15 ENCOUNTER — Other Ambulatory Visit: Payer: Self-pay | Admitting: Internal Medicine

## 2022-01-03 ENCOUNTER — Other Ambulatory Visit: Payer: Self-pay | Admitting: Internal Medicine

## 2022-01-03 DIAGNOSIS — I2510 Atherosclerotic heart disease of native coronary artery without angina pectoris: Secondary | ICD-10-CM

## 2022-01-03 DIAGNOSIS — E785 Hyperlipidemia, unspecified: Secondary | ICD-10-CM

## 2022-01-04 ENCOUNTER — Other Ambulatory Visit: Payer: Self-pay | Admitting: Internal Medicine

## 2022-01-04 DIAGNOSIS — I2510 Atherosclerotic heart disease of native coronary artery without angina pectoris: Secondary | ICD-10-CM

## 2022-01-04 DIAGNOSIS — E785 Hyperlipidemia, unspecified: Secondary | ICD-10-CM

## 2022-01-04 MED ORDER — ATORVASTATIN CALCIUM 10 MG PO TABS: 10.0000 mg | ORAL_TABLET | Freq: Every day | ORAL | 1 refills | Status: DC

## 2022-01-07 ENCOUNTER — Other Ambulatory Visit: Payer: Self-pay | Admitting: Internal Medicine

## 2022-01-07 DIAGNOSIS — J411 Mucopurulent chronic bronchitis: Secondary | ICD-10-CM

## 2022-01-07 DIAGNOSIS — J42 Unspecified chronic bronchitis: Secondary | ICD-10-CM

## 2022-01-07 DIAGNOSIS — M159 Polyosteoarthritis, unspecified: Secondary | ICD-10-CM

## 2022-01-07 DIAGNOSIS — J438 Other emphysema: Secondary | ICD-10-CM

## 2022-01-07 DIAGNOSIS — F409 Phobic anxiety disorder, unspecified: Secondary | ICD-10-CM

## 2022-01-07 DIAGNOSIS — J449 Chronic obstructive pulmonary disease, unspecified: Secondary | ICD-10-CM | POA: Insufficient documentation

## 2022-01-07 MED ORDER — TRELEGY ELLIPTA 100-62.5-25 MCG/ACT IN AEPB: 1.0000 | INHALATION_SPRAY | Freq: Every day | RESPIRATORY_TRACT | 1 refills | Status: DC

## 2022-01-11 ENCOUNTER — Encounter: Payer: Self-pay | Admitting: Internal Medicine

## 2022-01-11 ENCOUNTER — Ambulatory Visit (INDEPENDENT_AMBULATORY_CARE_PROVIDER_SITE_OTHER): Payer: Medicare Other | Admitting: Internal Medicine

## 2022-01-11 VITALS — BP 132/70 | HR 75 | Ht 62.0 in | Wt 112.0 lb

## 2022-01-11 DIAGNOSIS — I350 Nonrheumatic aortic (valve) stenosis: Secondary | ICD-10-CM | POA: Diagnosis not present

## 2022-01-11 DIAGNOSIS — E785 Hyperlipidemia, unspecified: Secondary | ICD-10-CM

## 2022-01-11 DIAGNOSIS — I2510 Atherosclerotic heart disease of native coronary artery without angina pectoris: Secondary | ICD-10-CM | POA: Diagnosis not present

## 2022-01-11 DIAGNOSIS — I5032 Chronic diastolic (congestive) heart failure: Secondary | ICD-10-CM | POA: Diagnosis not present

## 2022-01-11 MED ORDER — PITAVASTATIN CALCIUM 2 MG PO TABS: 1.0000 | ORAL_TABLET | Freq: Every day | ORAL | 3 refills | Status: DC

## 2022-01-11 NOTE — Progress Notes (Signed)
? ? ?OFFICE NOTE ? ?Chief Complaint:  ?Occasional shortness of breath ? ?Primary Care Physician: ?[name], MD ? ?HPI:  ?[name] a pleasant 76 year old female with a history of COPD and severe pulmonary hypertension. Recently she was put on home oxygen and has been on an oxygen concentrator, which has made a marked improvement in her ability to get around. She has previously seen Dr. Lamonte Sakai with pulmonology; however, that did not go too well and she has basically given up on any further treatments for COPD other than her oxygen. She still uses nebulizers as needed when she is tight, and I recommended Claritin for seasonal allergies today. As you know, she also has mild aortic stenosis and we are continuing to follow that. She is a low-risk stress test in November 2012. Unfortunately she was recently admitted for altered mental status, possible Tylenol overdose, respiratory failure and liver failure. All of which seems to have resolved. She seems to be doing pre-well after discharge. ? ?She returns today for followup. She reports doing fairly well. She has seen Dr. Lake Bells in the interim and he is recommended continue current therapies. She is now seeing a different internist, Dr. Ronnald Ramp. He was recently admitted the hospital for pneumonia and treated accordingly. She had troponins which were negative suggesting they're likely is no significant underlying obstructive coronary disease. She reports she's recovered and is back to her base. She is overdue for repeat lipid profile. ? ?I saw [name] back in the office today. Overall she reports she is doing fairly well. She's not been admitted to the hospital recently. She says she has stable shortness of breath. She denies any chest pain. It should be noted that she does have mild aortic stenosis which will last assessed in 2014. ? ?[name] returns today for follow-up. She reports that she is doing fairly well. [name] she's not been hospitalized.  She has had an episode of COPD exacerbation which was supported with early antibiotics. She denies any worsening chest pain and has pretty stable shortness of breath on oxygen. She is able to ambulate and do most of her activities without any restrictions. ? ?03/31/2016 ? ?[name] was seen back today in the office for follow-up. She seems to be doing fairly well although does use her oxygen almost continuously. Saturation was low today although she had heavy fingernail polish on and therefore the reading may not be accurate as it read 88%. She was on oxygen via a concentrator. Blood pressure was stable at 108/74. Weight is been stable. She recently had a repeat echocardiogram which shows stable mild aortic stenosis and normal LV function with some mild improvement in pulmonary pressures in May. She denies any chest pain. [name] that she's not been admitted to the hospital recently for any pneumonia but did have problems with recent gallbladder disease and had cholecystectomy. ? ?12/17/2016 ? ?[name] returns today for follow-up. She's done well over the last year has not been hospitalized. She reports stable shortness of breath. She had mild aortic stenosis which is stable by echo last year and will need to be repeated in May of this year. Her last lipid profile showed total cholesterol 197, HDL C 78, triglycerides 100, an LDL-C 99. We'll need to repeat her lipid profile as well. ? ?06/20/2017 ? ?I saw [name] back today for follow-up. She is doing well. We repeated on echo in 01/2017, this demonstrated normal LVEF 60-65%, mild stable aortic stenosis. She denies any worsening shortness of breath  or chest pain. Cholesterol remains elevated. Will plan to recheck that today. She could conceivably increase her Livalo. She reports having had her flu vaccine this year. ? ?08/16/2017 ? ?[name] returns today for follow-up.  She seen specifically for oxygen testing.  We tested her room air saturation off of oxygen and it was noted to  be 88% at rest.  We did not do ambulatory testing as she met criteria for oxygen therapy.  She is currently on an oxygen concentrator which we wish to renew.  She needs the oxygen concentrator as she is active and is mobile but requires continuous oxygen therapy -she is on 3 L continuous by nasal cannula. ? ?12/09/2017 ? ?[name] returns today for follow-up.  She reports doing fairly well.  She is pleased that she finally got her oxygen concentrator.  Blood pressure is well-controlled today 122/72.  She denies chest pain or worsening shortness of breath.  Should her weight is been stable.  She had a recent lipid profile in December which showed LDL cholesterol of 81 and non-HDL cholesterol of 98.  She does have mild aortic valve stenosis which was assessed by echo last year. ? ?07/03/2018 ?  ?[name] returns today for follow-up.  Overall she feels like her breathing is done well.  She has not been hospitalized for pulmonary issues.  She denies chest pain or significant shortness of breath.  She continues to use oxygen.  She reports some improvement in her headaches.  Blood pressure remains soft but does not seem to be bothersome for her.  Her last lipid profile shows good control of her cholesterol.  She remains on Livalo.  Echo in 2018 showed mild aortic stenosis. ? ?12/14/2019 ? ?[name] is seen today in follow-up.  She continues to be fairly stable from a cardiac standpoint.  She denies any chest pain or shortness of breath.  Her COPD seems to be stable on oxygen.  Blood pressure today is well controlled at 128/78.  Is now been a couple years since her last echo which did show some mild aortic stenosis.  She also has moderate coronary artery disease but does not seem to have had any issues with it.  EKG today shows sinus rhythm at 71. Her most recent lipids were TC 176, TG 92, HDL 76 and LDL 81. ? ?08/26/2020 ? ?[name] returns today for follow-up.  So far her COPD seems to be fairly stable on oxygen.  She had a repeat echo  last April which showed stable LVEF of 55 to 60% with normal strain and grade 1 diastolic dysfunction.  Although there was mildly elevated pulmonary pressure the RV systolic function was normal.  There is mild to moderate aortic stenosis which seems fairly stable with mean gradient of 15 and peak gradient 29 mmHg.  Symptomatically she says she is about the same.  She continues to smoke some.  She had an issue with bowel impaction and now is on Linzess. ? ?03/17/2021 ? ?Rhanda is seen today in follow-up.  Unfortunately in March she was hospitalized with what sounds like a pneumonia/sepsis picture.  She also had acute on chronic diastolic heart failure and mild troponin elevation which was chalked up to strain however cardiology was not involved in her visit.  She had a repeat echo which showed EF 55 to 11%, grade 2 diastolic dysfunction and now moderate aortic stenosis.  Her mean gradient has increased from 15 to 26.8 mmHg.  She denies any chest pain or worsening shortness of breath.  She had cut back on her Lasix from 40 mg twice daily down to 40 mg daily because she "pees too much at night".  I felt that this might explain why she may be not feeling as well as she did prior to this hospitalization.  It also could be that she has had some progression of her lung disease. ? ?01/11/2022 ? ?Maylee returns today for follow-up.  She reports some occasional shortness of breath but mostly attributes that to allergies.  She has followed with pulmonary last seen in October.  She just had a repeat echo which does show some worsening of her aortic valve disease.  Her mean gradient is 26.0 suggesting moderate to severe aortic stenosis, although suggesting worsening aortic stenosis, the echo in 2022 showed a mean gradient of 26.8 mmHg -  her DVI is 0.26.  She denies any worsening shortness of breath or chest pain.  ? ?PMHx:  ?Past Medical History:  ?Diagnosis Date  ? Abnormal LFTs 08/02/2011  ? Acute liver failure 06/19/2013  ? Acute  renal failure ([place]) 06/19/2013  ? Acute respiratory failure ([place]) 06/19/2013  ? Altered mental status 08/01/2011  ? Angina   ? Anxiety   ? Anxiety state, unspecified 12/03/2013  ? Aortic stenosis   ? mild

## 2022-01-11 NOTE — Patient Instructions (Signed)
Medication Instructions:  ?Dr. Debara Pickett has changed atorvastatin '10mg'$  back to pitavastatin '2mg'$  daily  ? ?*If you need a refill on your cardiac medications before your next appointment, please call your pharmacy* ? ? ?Lab Work:  ?FASTING LAB WORK to check cholesterol in October - before next visit with Dr. Debara Pickett  ?If you have labs (blood work) drawn today and your tests are completely normal, you will receive your results only by: ?MyChart Message (if you have MyChart) OR ?A paper copy in the mail ?If you have any lab test that is abnormal or we need to change your treatment, we will call you to review the results. ? ? ?Testing/Procedures: ?Your physician has requested that you have an echocardiogram. Echocardiography is a painless test that uses sound waves to create images of your heart. It provides your doctor with information about the size and shape of your heart and how well your heart?s chambers and valves are working. This procedure takes approximately one hour. There are no restrictions for this procedure. ?This test will be done at [facility] ([address], [place]) ?This will be due mid-late October 2023 ? ? ?Follow-Up: ?At [facility], you and your health needs are our priority.  As part of our continuing mission to provide you with exceptional heart care, we have created designated Provider Care Teams.  These Care Teams include your primary Cardiologist (physician) and Advanced Practice Providers (APPs -  Physician Assistants and Nurse Practitioners) who all work together to provide you with the care you need, when you need it. ? ?We recommend signing up for the patient portal called "MyChart".  Sign up information is provided on this After Visit Summary.  MyChart is used to connect with patients for Virtual Visits (Telemedicine).  Patients are able to view lab/test results, encounter notes, upcoming appointments, etc.  Non-urgent messages can be sent to your provider as well.   ?To  learn more about what you can do with MyChart, go to [email or web address].   ? ?Your next appointment:   ?6 month(s) -- after echo ? ?The format for your next appointment:   ?In Person ? ?Provider:   ?Pixie Casino, MD { ? ? ?Important Information About Sugar ? ? ? ? ? ? ?

## 2022-01-13 ENCOUNTER — Other Ambulatory Visit: Payer: Self-pay | Admitting: Internal Medicine

## 2022-01-13 ENCOUNTER — Telehealth: Payer: Self-pay | Admitting: Internal Medicine

## 2022-01-13 ENCOUNTER — Telehealth: Payer: Self-pay | Admitting: Pulmonary Disease

## 2022-01-13 DIAGNOSIS — K2100 Gastro-esophageal reflux disease with esophagitis, without bleeding: Secondary | ICD-10-CM

## 2022-01-13 DIAGNOSIS — J449 Chronic obstructive pulmonary disease, unspecified: Secondary | ICD-10-CM

## 2022-01-13 MED ORDER — BUDESONIDE-FORMOTEROL FUMARATE 160-4.5 MCG/ACT IN AERO: 2.0000 | INHALATION_SPRAY | Freq: Two times a day (BID) | RESPIRATORY_TRACT | 6 refills | Status: DC

## 2022-01-13 MED ORDER — PANTOPRAZOLE SODIUM 40 MG PO TBEC: 40.0000 mg | DELAYED_RELEASE_TABLET | Freq: Every day | ORAL | 1 refills | Status: DC

## 2022-01-13 MED ORDER — ATROVENT HFA 17 MCG/ACT IN AERS: 2.0000 | INHALATION_SPRAY | RESPIRATORY_TRACT | 9 refills | Status: DC | PRN

## 2022-01-13 NOTE — Telephone Encounter (Signed)
1.Medication Requested: pantoprazole (PROTONIX) 40 MG tablet ? ?2. Pharmacy (Name, Street, [place]): [id number] [place], [place]? ?3. On Med List: Y ? ?4. Last Visit with PCP: 10-07-2021 ? ?5. Next visit date with PCP: 02-03-2022 ? ? ?Agent: Please be advised that RX refills may take up to 3 business days. We ask that you follow-up with your pharmacy.  ?

## 2022-01-13 NOTE — Telephone Encounter (Signed)
Pt states provider prescribed a powder form medication that she is unable to swallow ? ?Pt did not know the name of the medication, pt requesting a cb to discuss alternatives ?

## 2022-01-13 NOTE — Telephone Encounter (Signed)
Called patient and got it cleared up that she needs to talk to Dr Silas Flood first if any other doctor tries to change her inhalers.  ? ?Refilled her previous medications and got her back on track with MHs course of treatment. Patient verbalized understanding. Nothing further needed.  ?

## 2022-01-20 ENCOUNTER — Telehealth: Payer: Self-pay | Admitting: Pulmonary Disease

## 2022-01-20 ENCOUNTER — Other Ambulatory Visit: Payer: Self-pay | Admitting: Internal Medicine

## 2022-01-20 DIAGNOSIS — J449 Chronic obstructive pulmonary disease, unspecified: Secondary | ICD-10-CM

## 2022-01-20 DIAGNOSIS — J438 Other emphysema: Secondary | ICD-10-CM

## 2022-01-20 DIAGNOSIS — J9611 Chronic respiratory failure with hypoxia: Secondary | ICD-10-CM

## 2022-01-20 NOTE — Telephone Encounter (Signed)
Tried calling pharmacy to see what the issues with the Symbicort were, But got no answer. Will call pharmacy again tomorrow.  ?

## 2022-01-21 MED ORDER — MOMETASONE FURO-FORMOTEROL FUM 200-5 MCG/ACT IN AERO: 2.0000 | INHALATION_SPRAY | Freq: Two times a day (BID) | RESPIRATORY_TRACT | 3 refills | Status: DC

## 2022-01-21 NOTE — Telephone Encounter (Signed)
Let's do high dose dulera. Lt patient know insurance no longer covering Symbicort.

## 2022-01-21 NOTE — Telephone Encounter (Signed)
Called and left message for patient that her insurance is no longer covering her Symbicort. I advised her the [facility] is sending ina new inhaler called Dulera. And that if she had any questions about this new inhaler to call the office. Nothing further needed  ?

## 2022-01-21 NOTE — Telephone Encounter (Signed)
Called pharmacy this morning and they state her insurance will no longer cover the Symbicort.  ? ?We can do a PA ? ?Or they will cover ?Dulera ?Breo  ?Airduo ? ?MH please advise ?

## 2022-01-25 ENCOUNTER — Other Ambulatory Visit: Payer: Self-pay | Admitting: Internal Medicine

## 2022-01-25 DIAGNOSIS — G43009 Migraine without aura, not intractable, without status migrainosus: Secondary | ICD-10-CM

## 2022-01-25 DIAGNOSIS — K7682 Hepatic encephalopathy: Secondary | ICD-10-CM

## 2022-01-25 DIAGNOSIS — L089 Local infection of the skin and subcutaneous tissue, unspecified: Secondary | ICD-10-CM

## 2022-01-26 ENCOUNTER — Other Ambulatory Visit: Payer: Self-pay | Admitting: Internal Medicine

## 2022-01-26 DIAGNOSIS — G43009 Migraine without aura, not intractable, without status migrainosus: Secondary | ICD-10-CM

## 2022-01-26 DIAGNOSIS — K7682 Hepatic encephalopathy: Secondary | ICD-10-CM

## 2022-01-26 MED ORDER — ENULOSE 10 GM/15ML PO SOLN: 30.0000 g | Freq: Three times a day (TID) | ORAL | 0 refills | Status: DC | PRN

## 2022-01-26 MED ORDER — BUTALBITAL-APAP-CAFFEINE 50-325-40 MG PO TABS: 1.0000 | ORAL_TABLET | ORAL | 3 refills | Status: DC | PRN

## 2022-01-31 ENCOUNTER — Other Ambulatory Visit: Payer: Self-pay | Admitting: Internal Medicine

## 2022-01-31 DIAGNOSIS — J301 Allergic rhinitis due to pollen: Secondary | ICD-10-CM

## 2022-02-03 ENCOUNTER — Ambulatory Visit: Payer: Medicare Other | Admitting: Internal Medicine

## 2022-02-17 ENCOUNTER — Other Ambulatory Visit: Payer: Self-pay | Admitting: Internal Medicine

## 2022-02-17 DIAGNOSIS — K7682 Hepatic encephalopathy: Secondary | ICD-10-CM

## 2022-02-17 DIAGNOSIS — L089 Local infection of the skin and subcutaneous tissue, unspecified: Secondary | ICD-10-CM

## 2022-02-24 ENCOUNTER — Other Ambulatory Visit: Payer: Self-pay

## 2022-02-25 ENCOUNTER — Telehealth: Payer: Self-pay

## 2022-02-25 ENCOUNTER — Other Ambulatory Visit: Payer: Self-pay | Admitting: Internal Medicine

## 2022-02-25 DIAGNOSIS — J301 Allergic rhinitis due to pollen: Secondary | ICD-10-CM

## 2022-02-25 NOTE — Telephone Encounter (Signed)
**Note De-Identified  Obfuscation** Livalo PA started through covermymeds. Key: [id number]

## 2022-02-26 NOTE — Telephone Encounter (Signed)
**Note De-Identified  Obfuscation** Livalo PA denial letter received form [employer].  Patient Name: [name] Patient DOB: [birth year] Patient ID: [id number] Status of Request: Deny Medication Name: Livalo Tab '2mg'$   Decision Notes: Livalo is denied because it is not on your plan's Drug List (formulary). Medication authorization requires the following: (1) You need to try three (3) of these covered drugs: Atorvastatin Lovastatin. Pravastatin. Rosuvastatin Simvastatin (2) OR your doctor needs to give [place] specific medical reasons why three (3) of the covered drug(s) are not appropriate for you.  Per the pts med list history he has not taken any of the covered medications listed above.  Forwarding this note to Dr Claiborne Billings and his nurse for advisement to the pt.

## 2022-03-03 ENCOUNTER — Ambulatory Visit (INDEPENDENT_AMBULATORY_CARE_PROVIDER_SITE_OTHER): Payer: Medicare Other | Admitting: Internal Medicine

## 2022-03-03 ENCOUNTER — Encounter: Payer: Self-pay | Admitting: Internal Medicine

## 2022-03-03 ENCOUNTER — Other Ambulatory Visit: Payer: Self-pay | Admitting: Internal Medicine

## 2022-03-03 VITALS — BP 140/76 | HR 73 | Temp 98.2°F | Ht 62.0 in | Wt 109.0 lb

## 2022-03-03 DIAGNOSIS — I700 Atherosclerosis of aorta: Secondary | ICD-10-CM

## 2022-03-03 DIAGNOSIS — E213 Hyperparathyroidism, unspecified: Secondary | ICD-10-CM

## 2022-03-03 DIAGNOSIS — D510 Vitamin B12 deficiency anemia due to intrinsic factor deficiency: Secondary | ICD-10-CM | POA: Diagnosis not present

## 2022-03-03 DIAGNOSIS — I2510 Atherosclerotic heart disease of native coronary artery without angina pectoris: Secondary | ICD-10-CM | POA: Diagnosis not present

## 2022-03-03 DIAGNOSIS — E611 Iron deficiency: Secondary | ICD-10-CM

## 2022-03-03 DIAGNOSIS — I10 Essential (primary) hypertension: Secondary | ICD-10-CM | POA: Diagnosis not present

## 2022-03-03 DIAGNOSIS — H608X3 Other otitis externa, bilateral: Secondary | ICD-10-CM | POA: Insufficient documentation

## 2022-03-03 DIAGNOSIS — N1831 Chronic kidney disease, stage 3a: Secondary | ICD-10-CM

## 2022-03-03 DIAGNOSIS — M159 Polyosteoarthritis, unspecified: Secondary | ICD-10-CM

## 2022-03-03 DIAGNOSIS — E785 Hyperlipidemia, unspecified: Secondary | ICD-10-CM | POA: Diagnosis not present

## 2022-03-03 DIAGNOSIS — H6023 Malignant otitis externa, bilateral: Secondary | ICD-10-CM

## 2022-03-03 LAB — CBC WITH DIFFERENTIAL/PLATELET
Basophils Absolute: 0.1 10*3/uL (ref 0.0–0.1)
Basophils Relative: 0.8 % (ref 0.0–3.0)
Eosinophils Absolute: 0.1 10*3/uL (ref 0.0–0.7)
Eosinophils Relative: 0.8 % (ref 0.0–5.0)
HCT: 36.2 % (ref 36.0–46.0)
Hemoglobin: 11.4 g/dL — ABNORMAL LOW (ref 12.0–15.0)
Lymphocytes Relative: 16.2 % (ref 12.0–46.0)
Lymphs Abs: 1.1 10*3/uL (ref 0.7–4.0)
MCHC: 31.6 g/dL (ref 30.0–36.0)
MCV: 88 fl (ref 78.0–100.0)
Monocytes Absolute: 0.4 10*3/uL (ref 0.1–1.0)
Monocytes Relative: 5.7 % (ref 3.0–12.0)
Neutro Abs: 5.2 10*3/uL (ref 1.4–7.7)
Neutrophils Relative %: 76.5 % (ref 43.0–77.0)
Platelets: 230 10*3/uL (ref 150.0–400.0)
RBC: 4.12 Mil/uL (ref 3.87–5.11)
RDW: 16.8 % — ABNORMAL HIGH (ref 11.5–15.5)
WBC: 6.7 10*3/uL (ref 4.0–10.5)

## 2022-03-03 LAB — LIPID PANEL
Cholesterol: 208 mg/dL — ABNORMAL HIGH (ref 0–200)
HDL: 86.7 mg/dL (ref >39.00)
LDL Cholesterol: 100 mg/dL — ABNORMAL HIGH (ref 0–99)
NonHDL: 120.8
Total CHOL/HDL Ratio: 2
Triglycerides: 104 mg/dL (ref 0.0–149.0)
VLDL: 20.8 mg/dL (ref 0.0–40.0)

## 2022-03-03 LAB — IBC + FERRITIN
Ferritin: 14.2 ng/mL (ref 10.0–291.0)
Iron: 101 ug/dL (ref 42–145)
Saturation Ratios: 22.3 % (ref 20.0–50.0)
TIBC: 453.6 ug/dL — ABNORMAL HIGH (ref 250.0–450.0)
Transferrin: 324 mg/dL (ref 212.0–360.0)

## 2022-03-03 LAB — HEPATIC FUNCTION PANEL
ALT: 11 U/L (ref 0–35)
AST: 19 U/L (ref 0–37)
Albumin: 4.2 g/dL (ref 3.5–5.2)
Alkaline Phosphatase: 52 U/L (ref 39–117)
Bilirubin, Direct: 0.1 mg/dL (ref 0.0–0.3)
Total Bilirubin: 0.4 mg/dL (ref 0.2–1.2)
Total Protein: 7.7 g/dL (ref 6.0–8.3)

## 2022-03-03 LAB — BASIC METABOLIC PANEL
BUN: 11 mg/dL (ref 6–23)
CO2: 38 mEq/L — ABNORMAL HIGH (ref 19–32)
Calcium: 10.2 mg/dL (ref 8.4–10.5)
Chloride: 97 mEq/L (ref 96–112)
Creatinine, Ser: 0.77 mg/dL (ref 0.40–1.20)
GFR: 75.05 mL/min (ref >60.00)
Glucose, Bld: 82 mg/dL (ref 70–99)
Potassium: 4.7 mEq/L (ref 3.5–5.1)
Sodium: 138 mEq/L (ref 135–145)

## 2022-03-03 LAB — TSH: TSH: 3.31 u[IU]/mL (ref 0.35–5.50)

## 2022-03-03 LAB — VITAMIN B12: Vitamin B-12: >1504 pg/mL — ABNORMAL HIGH (ref 211–911)

## 2022-03-03 LAB — FOLATE: Folate: 9.8 ng/mL (ref >5.9)

## 2022-03-03 MED ORDER — NEOMYCIN-POLYMYXIN-HC 1 % OT SOLN: 3.0000 [drp] | Freq: Four times a day (QID) | OTIC | 2 refills | Status: DC

## 2022-03-03 NOTE — Patient Instructions (Signed)
Hypertension, Adult High blood pressure (hypertension) is when the force of blood pumping through the arteries is too strong. The arteries are the blood vessels that carry blood from the heart throughout the body. Hypertension forces the heart to work harder to pump blood and may cause arteries to become narrow or stiff. Untreated or uncontrolled hypertension can lead to a heart attack, heart failure, a stroke, kidney disease, and other problems. A blood pressure reading consists of a higher number over a lower number. Ideally, your blood pressure should be below 120/80. The first ("top") number is called the systolic pressure. It is a measure of the pressure in your arteries as your heart beats. The second ("bottom") number is called the diastolic pressure. It is a measure of the pressure in your arteries as the heart relaxes. What are the causes? The exact cause of this condition is not known. There are some conditions that result in high blood pressure. What increases the risk? Certain factors may make you more likely to develop high blood pressure. Some of these risk factors are under your control, including: Smoking. Not getting enough exercise or physical activity. Being overweight. Having too much fat, sugar, calories, or salt (sodium) in your diet. Drinking too much alcohol. Other risk factors include: Having a personal history of heart disease, diabetes, high cholesterol, or kidney disease. Stress. Having a family history of high blood pressure and high cholesterol. Having obstructive sleep apnea. Age. The risk increases with age. What are the signs or symptoms? High blood pressure may not cause symptoms. Very high blood pressure (hypertensive crisis) may cause: Headache. Fast or irregular heartbeats (palpitations). Shortness of breath. Nosebleed. Nausea and vomiting. Vision changes. Severe chest pain, dizziness, and seizures. How is this diagnosed? This condition is diagnosed by  measuring your blood pressure while you are seated, with your arm resting on a flat surface, your legs uncrossed, and your feet flat on the floor. The cuff of the blood pressure monitor will be placed directly against the skin of your upper arm at the level of your heart. Blood pressure should be measured at least twice using the same arm. Certain conditions can cause a difference in blood pressure between your right and left arms. If you have a high blood pressure reading during one visit or you have normal blood pressure with other risk factors, you may be asked to: Return on a different day to have your blood pressure checked again. Monitor your blood pressure at home for 1 week or longer. If you are diagnosed with hypertension, you may have other blood or imaging tests to help your health care provider understand your overall risk for other conditions. How is this treated? This condition is treated by making healthy lifestyle changes, such as eating healthy foods, exercising more, and reducing your alcohol intake. You may be referred for counseling on a healthy diet and physical activity. Your health care provider may prescribe medicine if lifestyle changes are not enough to get your blood pressure under control and if: Your systolic blood pressure is above 130. Your diastolic blood pressure is above 80. Your personal target blood pressure may vary depending on your medical conditions, your age, and other factors. Follow these instructions at home: Eating and drinking  Eat a diet that is high in fiber and potassium, and low in sodium, added sugar, and fat. An example of this eating plan is called the DASH diet. DASH stands for Dietary Approaches to Stop Hypertension. To eat this way: Eat   plenty of fresh fruits and vegetables. Try to fill one half of your plate at each meal with fruits and vegetables. Eat whole grains, such as whole-wheat pasta, brown rice, or whole-grain bread. Fill about one  fourth of your plate with whole grains. Eat or drink low-fat dairy products, such as skim milk or low-fat yogurt. Avoid fatty cuts of meat, processed or cured meats, and poultry with skin. Fill about one fourth of your plate with lean proteins, such as fish, chicken without skin, beans, eggs, or tofu. Avoid pre-made and processed foods. These tend to be higher in sodium, added sugar, and fat. Reduce your daily sodium intake. Many people with hypertension should eat less than 1,500 mg of sodium a day. Do not drink alcohol if: Your health care provider tells you not to drink. You are pregnant, may be pregnant, or are planning to become pregnant. If you drink alcohol: Limit how much you have to: 0-1 drink a day for women. 0-2 drinks a day for men. Know how much alcohol is in your drink. In the [place] one drink equals one 12 oz bottle of beer (355 mL), one 5 oz glass of wine (148 mL), or one 1 oz glass of hard liquor (44 mL). Lifestyle  Work with your health care provider to maintain a healthy body weight or to lose weight. Ask what an ideal weight is for you. Get at least 30 minutes of exercise that causes your heart to beat faster (aerobic exercise) most days of the week. Activities may include walking, swimming, or biking. Include exercise to strengthen your muscles (resistance exercise), such as Pilates or lifting weights, as part of your weekly exercise routine. Try to do these types of exercises for 30 minutes at least 3 days a week. Do not use any products that contain nicotine or tobacco. These products include cigarettes, chewing tobacco, and vaping devices, such as e-cigarettes. If you need help quitting, ask your health care provider. Monitor your blood pressure at home as told by your health care provider. Keep all follow-up visits. This is important. Medicines Take over-the-counter and prescription medicines only as told by your health care provider. Follow directions carefully. Blood  pressure medicines must be taken as prescribed. Do not skip doses of blood pressure medicine. Doing this puts you at risk for problems and can make the medicine less effective. Ask your health care provider about side effects or reactions to medicines that you should watch for. Contact a health care provider if you: Think you are having a reaction to a medicine you are taking. Have headaches that keep coming back (recurring). Feel dizzy. Have swelling in your ankles. Have trouble with your vision. Get help right away if you: Develop a severe headache or confusion. Have unusual weakness or numbness. Feel faint. Have severe pain in your chest or abdomen. Vomit repeatedly. Have trouble breathing. These symptoms may be an emergency. Get help right away. Call 911. Do not wait to see if the symptoms will go away. Do not drive yourself to the hospital. Summary Hypertension is when the force of blood pumping through your arteries is too strong. If this condition is not controlled, it may put you at risk for serious complications. Your personal target blood pressure may vary depending on your medical conditions, your age, and other factors. For most people, a normal blood pressure is less than 120/80. Hypertension is treated with lifestyle changes, medicines, or a combination of both. Lifestyle changes include losing weight, eating a healthy,   low-sodium diet, exercising more, and limiting alcohol. This information is not intended to replace advice given to you by your health care provider. Make sure you discuss any questions you have with your health care provider. Document Revised: 07/21/2021 Document Reviewed: 07/21/2021 Elsevier Patient Education  2023 [employer]  

## 2022-03-03 NOTE — Progress Notes (Unsigned)
Subjective:  Patient ID: [name], female    DOB: April 17, 1946  Age: 76 y.o. MRN: [id number]  CC: COPD, Hypertension, Hyperlipidemia, and Coronary Artery Disease   HPI [name] presents for f/up -  She complains of a several month history of rash, itching, redness, and swelling over both years.  She has had a slight decrease in her hearing.  She also complains of edema into her upper and lower extremities.  Outpatient Medications Prior to Visit  Medication Sig Dispense Refill   ALPRAZolam (XANAX) 1 MG tablet Take 1 tablet (1 mg total) by mouth 3 (three) times daily as needed for anxiety. 90 tablet 3   ATROVENT HFA 17 MCG/ACT inhaler INHALE 2 PUFFS INTO THE LUNGS EVERY 4 HOURS AS NEEDED 77.4 each 3   budesonide-formoterol (SYMBICORT) 160-4.5 MCG/ACT inhaler Inhale 2 puffs into the lungs 2 (two) times daily. 1 each 6   butalbital-acetaminophen-caffeine (FIORICET) 50-325-40 MG tablet Take 1 tablet by mouth every 4 (four) hours as needed for headache. 65 tablet 3   cholecalciferol (VITAMIN D) 1000 units tablet Take 1,000 Units by mouth daily.     diclofenac Sodium (VOLTAREN) 1 % GEL APPLY 2 GRAMS TO AFFECTED AREA 4 TIMES A DAY 300 g 1   fluticasone (FLONASE) 50 MCG/ACT nasal spray USE 2 SPRAYS INTO EACH NOSTRIL ONCE DAILY**REPEAT FOR 5 DAYS THEN STOP (Patient taking differently: Place 2 sprays into both nostrils daily as needed for allergies.) 16 g 11   Fluticasone-Umeclidin-Vilant (TRELEGY ELLIPTA) 100-62.5-25 MCG/ACT AEPB Inhale 1 puff into the lungs daily. 120 each 1   furosemide (LASIX) 40 MG tablet TAKE 1 TABLET BY MOUTH TWICE A DAY 180 tablet 0   ipratropium-albuterol (DUONEB) 0.5-2.5 (3) MG/3ML SOLN Inhale 3 mLs into the lungs every 6 (six) hours as needed (for SOB, wheeze or cough). 360 mL 2   KLOR-CON M20 20 MEQ tablet TAKE 1 TABLET BY MOUTH EVERY DAY 90 tablet 1   lactulose, encephalopathy, (ENULOSE) 10 GM/15ML SOLN Take 45 mLs (30 g total) by mouth 3 (three) times daily  as needed (constipation). 1892 mL 0   levocetirizine (XYZAL) 5 MG tablet TAKE 1 TABLET BY MOUTH EVERY DAY IN THE EVENING 90 tablet 1   LINZESS 72 MCG capsule TAKE 1 CAPSULE BY MOUTH EVERY DAY BEFORE BREAKFAST 90 capsule 1   LIVALO 2 MG TABS TAKE 1 TABLET BY MOUTH EVERY DAY 90 tablet 3   mometasone-formoterol (DULERA) 200-5 MCG/ACT AERO Inhale 2 puffs into the lungs 2 (two) times daily. 1 each 3   montelukast (SINGULAIR) 10 MG tablet TAKE 1 TABLET BY MOUTH EVERYDAY AT BEDTIME 90 tablet 1   nortriptyline (PAMELOR) 25 MG capsule TAKE 2 CAPSULES BY MOUTH EVERY DAY AT BEDTIME 180 capsule 0   Omega 3 1200 MG CAPS Take 1,200 mg by mouth every morning.      OXYGEN-HELIUM IN Inhale 3 L into the lungs See admin instructions. Uses when needed during the day, uses continuous throughout the night     pantoprazole (PROTONIX) 40 MG tablet Take 1 tablet (40 mg total) by mouth daily. 90 tablet 1   polyvinyl alcohol (LIQUIFILM TEARS) 1.4 % ophthalmic solution Place 1 drop into both eyes as needed for dry eyes.     VENTOLIN HFA 108 (90 Base) MCG/ACT inhaler INHALE 1-2 PUFFS BY MOUTH EVERY 6 HOURS AS NEEDED FOR WHEEZE OR SHORTNESS OF BREATH 36 each 2   vitamin C (ASCORBIC ACID) 500 MG tablet Take 500 mg by mouth  daily.     vitamin B-12 (CYANOCOBALAMIN) 1000 MCG tablet Take 1 tablet (1,000 mcg total) by mouth daily. 90 tablet 1   No facility-administered medications prior to visit.    ROS Review of Systems  Constitutional:  Negative for chills, diaphoresis, fatigue and fever.  HENT: Negative.    Eyes: Negative.   Respiratory:  Positive for shortness of breath. Negative for cough, chest tightness and wheezing.   Cardiovascular:  Positive for leg swelling. Negative for chest pain and palpitations.  Gastrointestinal:  Negative for abdominal pain, diarrhea and nausea.  Endocrine: Negative.   Genitourinary: Negative.  Negative for difficulty urinating.  Musculoskeletal: Negative.   Skin: Negative.      Objective:  BP 140/76 (BP Location: Right Arm, Patient Position: Sitting, Cuff Size: Normal)   Pulse 73   Temp 98.2 F (36.8 C) (Oral)   Ht '5\' 2"'$  (1.575 m)   Wt 109 lb (49.4 kg)   SpO2 99%   BMI 19.94 kg/m   BP Readings from Last 3 Encounters:  03/03/22 140/76  01/11/22 132/70  10/07/21 116/72    Wt Readings from Last 3 Encounters:  03/03/22 109 lb (49.4 kg)  01/11/22 112 lb (50.8 kg)  10/07/21 115 lb (52.2 kg)    Physical Exam Vitals reviewed.  Constitutional:      General: She is not in acute distress.    Appearance: She is ill-appearing (oxygen). She is not toxic-appearing or diaphoretic.  HENT:     Right Ear: Decreased hearing noted. No drainage, swelling or tenderness. No middle ear effusion. There is no impacted cerumen. No foreign body.     Left Ear: Decreased hearing noted. No drainage, swelling or tenderness.  No middle ear effusion. There is no impacted cerumen. No foreign body.     Ears:     Comments: Both EAC's are mildly swollen with erythema and scale which extends over the outer ear.    Nose: Nose normal.     Mouth/Throat:     Mouth: Mucous membranes are moist.  Eyes:     General: No scleral icterus.    Conjunctiva/sclera: Conjunctivae normal.  Cardiovascular:     Rate and Rhythm: Normal rate and regular rhythm.     Heart sounds: Murmur heard.     Systolic murmur is present with a grade of 3/6.     No diastolic murmur is present.     No friction rub. No gallop.  Pulmonary:     Effort: Tachypnea and accessory muscle usage present.     Breath sounds: No stridor. Examination of the right-upper field reveals decreased breath sounds. Examination of the left-upper field reveals decreased breath sounds. Examination of the right-middle field reveals decreased breath sounds. Examination of the left-middle field reveals decreased breath sounds. Examination of the right-lower field reveals decreased breath sounds. Examination of the left-lower field reveals  decreased breath sounds. Decreased breath sounds present. No wheezing, rhonchi or rales.  Abdominal:     General: Abdomen is flat.     Palpations: There is no mass.     Tenderness: There is no abdominal tenderness. There is no guarding.     Hernia: No hernia is present.  Musculoskeletal:     Cervical back: Neck supple.     Right lower leg: Edema (trace pitting) present.     Left lower leg: Edema (trace pitting) present.  Lymphadenopathy:     Cervical: No cervical adenopathy.  Skin:    General: Skin is warm and dry.  Findings: No erythema or rash.  Neurological:     General: No focal deficit present.     Mental Status: She is alert.  Psychiatric:        Mood and Affect: Mood normal.        Behavior: Behavior normal.     Lab Results  Component Value Date   WBC 6.7 03/03/2022   HGB 11.4 (L) 03/03/2022   HCT 36.2 03/03/2022   PLT 230.0 03/03/2022   GLUCOSE 82 03/03/2022   CHOL 208 (H) 03/03/2022   TRIG 104.0 03/03/2022   HDL 86.70 03/03/2022   LDLCALC 100 (H) 03/03/2022   ALT 11 03/03/2022   AST 19 03/03/2022   NA 138 03/03/2022   K 4.7 03/03/2022   CL 97 03/03/2022   CREATININE 0.77 03/03/2022   BUN 11 03/03/2022   CO2 38 (H) 03/03/2022   TSH 3.31 03/03/2022   INR 1.0 12/23/2020   HGBA1C 5.1 01/15/2021    DG Ankle Complete Left  Result Date: 09/16/2021 CLINICAL DATA:  Left ankle injury EXAM: LEFT ANKLE COMPLETE - 3+ VIEW COMPARISON:  None. FINDINGS: There is no evidence of fracture, dislocation, or joint effusion. There is no evidence of arthropathy or other focal bone abnormality. Soft tissue irregularity and swelling about the lateral aspect of the lateral malleolus. IMPRESSION: 1. No acute fracture or dislocation. 2. Soft tissue edema and skin irregularity about the lateral aspect of the ankle. Electronically Signed   By: Keane Police D.O.   On: 09/16/2021 16:39   DG Foot Complete Left  Result Date: 09/16/2021 CLINICAL DATA:  Left foot injury EXAM: LEFT FOOT  - COMPLETE 3+ VIEW COMPARISON:  None. FINDINGS: No acute fracture or dislocation identified. Degenerative narrowing of the first metatarsophalangeal joint with subchondral sclerosis and small osteophytes. No significant soft tissue swelling in the foot. IMPRESSION: No acute osseous abnormality identified in the foot. Electronically Signed   By: Ofilia Neas M.D.   On: 09/16/2021 16:40    Assessment & Plan:   Satine was seen today for copd, hypertension, hyperlipidemia and coronary artery disease.  Diagnoses and all orders for this visit:  Stage 3a chronic kidney disease ([place])- Her renal function is stable. -     Basic metabolic panel; Future -     Urinalysis, Routine w reflex microscopic; Future -     Urinalysis, Routine w reflex microscopic -     Basic metabolic panel -     Urinalysis, Routine w reflex microscopic; Future -     Urinalysis, Routine w reflex microscopic  Vitamin B12 deficiency anemia due to intrinsic factor deficiency- Will continue B12 replacement therapy. -     Vitamin B12; Future -     CBC with Differential/Platelet; Future -     Folate; Future -     Folate -     CBC with Differential/Platelet -     Vitamin B12  Iron deficiency- H/H are stable -     CBC with Differential/Platelet; Future -     IBC + Ferritin; Future -     IBC + Ferritin -     CBC with Differential/Platelet  Primary hypertension- Her BP is well controlled. -     TSH; Future -     Urinalysis, Routine w reflex microscopic; Future -     Urinalysis, Routine w reflex microscopic -     TSH -     Urinalysis, Routine w reflex microscopic; Future -     Urinalysis, Routine w reflex microscopic  Atherosclerosis of aorta ([place])- Risk factor modifications addressed. -     Lipid panel; Future -     Lipid panel  Hyperparathyroidism ([place])- Her calcium level is normal now. -     Basic metabolic panel; Future -     Basic metabolic panel  Hyperlipidemia LDL goal <70- LDL goal achieved. Doing well on  the statin  -     TSH; Future -     Hepatic function panel; Future -     Hepatic function panel -     TSH  Chronic eczematous otitis externa of both ears- Will offer topical treatment.  There may also be a bacterial component so I recommended that she start ofloxacin.  She requested a referral to ENT.  If this does not improve soon then I recommended that she consider being admitted for intravenous antibiotics. -     NEOMYCIN-POLYMYXIN-HYDROCORTISONE (CORTISPORIN) 1 % SOLN OTIC solution; Place 3 drops into both ears every 6 (six) hours.   I am having [name] "[name]" start on NEOMYCIN-POLYMYXIN-HYDROCORTISONE. I am also having her maintain her OXYGEN-HELIUM IN, Omega 3, cholecalciferol, vitamin C, fluticasone, ipratropium-albuterol, polyvinyl alcohol, ALPRAZolam, K[place]-Con M20, Linzess, nortriptyline, Trelegy Ellipta, pantoprazole, Ventolin HFA, budesonide-formoterol, Atrovent HFA, mometasone-formoterol, butalbital-acetaminophen-caffeine, Enulose, levocetirizine, Livalo, furosemide, montelukast, diclofenac Sodium, ofloxacin, and vitamin B-12.  Meds ordered this encounter  Medications   NEOMYCIN-POLYMYXIN-HYDROCORTISONE (CORTISPORIN) 1 % SOLN OTIC solution    Sig: Place 3 drops into both ears every 6 (six) hours.    Dispense:  10 mL    Refill:  2   ofloxacin (FLOXIN) 300 MG tablet    Sig: Take 1 tablet (300 mg total) by mouth 2 (two) times daily for 10 days.    Dispense:  20 tablet    Refill:  0   vitamin B-12 (CYANOCOBALAMIN) 1000 MCG tablet    Sig: Take 1 tablet (1,000 mcg total) by mouth daily.    Dispense:  90 tablet    Refill:  1   I spent 50 minutes in preparing to see the patient by review of recent labs,, obtaining and reviewing separately obtained history, communicating with the patient and family, ordering medications and labs, and documenting clinical information in the EHR including the differential Dx, treatment, and any further evaluation and management of  multiple complex medical issues.     Follow-up: Return in about 6 months (around 09/02/2022).  Scarlette Calico, MD

## 2022-03-05 ENCOUNTER — Encounter: Payer: Self-pay | Admitting: Internal Medicine

## 2022-03-05 LAB — URINALYSIS, ROUTINE W REFLEX MICROSCOPIC
Bilirubin Urine: NEGATIVE
Hgb urine dipstick: NEGATIVE
Ketones, ur: NEGATIVE
Leukocytes,Ua: NEGATIVE
Nitrite: NEGATIVE
RBC / HPF: NONE SEEN (ref 0–?)
Specific Gravity, Urine: <1.005 — AB (ref 1.000–1.030)
Total Protein, Urine: NEGATIVE
Urine Glucose: NEGATIVE
Urobilinogen, UA: 0.2 — AB (ref 0.0–1.0)
pH: 5.5 (ref 5.0–8.0)

## 2022-03-05 NOTE — Telephone Encounter (Signed)
Letter of medical necessity composed. Will be faxed with MD note to [phone]

## 2022-03-08 ENCOUNTER — Other Ambulatory Visit: Payer: Self-pay | Admitting: Internal Medicine

## 2022-03-09 ENCOUNTER — Other Ambulatory Visit: Payer: Self-pay | Admitting: Internal Medicine

## 2022-03-09 ENCOUNTER — Encounter: Payer: Self-pay | Admitting: Internal Medicine

## 2022-03-09 DIAGNOSIS — H6023 Malignant otitis externa, bilateral: Secondary | ICD-10-CM | POA: Insufficient documentation

## 2022-03-09 MED ORDER — OFLOXACIN 300 MG PO TABS: 300.0000 mg | ORAL_TABLET | Freq: Two times a day (BID) | ORAL | 0 refills | Status: DC

## 2022-03-09 MED ORDER — VITAMIN B-12 1000 MCG PO TABS: 1000.0000 ug | ORAL_TABLET | Freq: Every day | ORAL | 1 refills | Status: DC

## 2022-03-09 MED ORDER — OFLOXACIN 300 MG PO TABS: 300.0000 mg | ORAL_TABLET | Freq: Two times a day (BID) | ORAL | 0 refills | Status: AC

## 2022-03-11 ENCOUNTER — Other Ambulatory Visit: Payer: Self-pay | Admitting: Internal Medicine

## 2022-03-11 DIAGNOSIS — F419 Anxiety disorder, unspecified: Secondary | ICD-10-CM

## 2022-03-18 ENCOUNTER — Other Ambulatory Visit: Payer: Self-pay | Admitting: Internal Medicine

## 2022-03-18 DIAGNOSIS — F409 Phobic anxiety disorder, unspecified: Secondary | ICD-10-CM

## 2022-03-21 ENCOUNTER — Encounter (HOSPITAL_COMMUNITY): Payer: Self-pay | Admitting: Emergency Medicine

## 2022-03-21 ENCOUNTER — Observation Stay (HOSPITAL_COMMUNITY)
Admission: EM | Admit: 2022-03-21 | Discharge: 2022-03-21 | Disposition: A | Discharge status: Home / Self Care | Payer: Medicare Other | Attending: Emergency Medicine | Admitting: Emergency Medicine

## 2022-03-21 ENCOUNTER — Emergency Department (HOSPITAL_COMMUNITY): Payer: Medicare Other

## 2022-03-21 DIAGNOSIS — H6023 Malignant otitis externa, bilateral: Secondary | ICD-10-CM | POA: Diagnosis not present

## 2022-03-21 DIAGNOSIS — I2510 Atherosclerotic heart disease of native coronary artery without angina pectoris: Secondary | ICD-10-CM | POA: Diagnosis not present

## 2022-03-21 DIAGNOSIS — I5030 Unspecified diastolic (congestive) heart failure: Secondary | ICD-10-CM | POA: Diagnosis not present

## 2022-03-21 DIAGNOSIS — H7090 Unspecified mastoiditis, unspecified ear: Secondary | ICD-10-CM | POA: Diagnosis present

## 2022-03-21 DIAGNOSIS — J449 Chronic obstructive pulmonary disease, unspecified: Secondary | ICD-10-CM

## 2022-03-21 DIAGNOSIS — J441 Chronic obstructive pulmonary disease with (acute) exacerbation: Principal | ICD-10-CM | POA: Insufficient documentation

## 2022-03-21 DIAGNOSIS — Z79899 Other long term (current) drug therapy: Secondary | ICD-10-CM | POA: Diagnosis not present

## 2022-03-21 DIAGNOSIS — I110 Hypertensive heart disease with heart failure: Secondary | ICD-10-CM | POA: Diagnosis not present

## 2022-03-21 DIAGNOSIS — Z955 Presence of coronary angioplasty implant and graft: Secondary | ICD-10-CM | POA: Diagnosis not present

## 2022-03-21 DIAGNOSIS — R0602 Shortness of breath: Secondary | ICD-10-CM | POA: Diagnosis present

## 2022-03-21 LAB — CBC WITH DIFFERENTIAL/PLATELET
Abs Immature Granulocytes: 0.01 10*3/uL (ref 0.00–0.07)
Basophils Absolute: 0 10*3/uL (ref 0.0–0.1)
Basophils Relative: 1 %
Eosinophils Absolute: 0 10*3/uL (ref 0.0–0.5)
Eosinophils Relative: 0 %
HCT: 37.3 % (ref 36.0–46.0)
Hemoglobin: 11.3 g/dL — ABNORMAL LOW (ref 12.0–15.0)
Immature Granulocytes: 0 %
Lymphocytes Relative: 9 %
Lymphs Abs: 0.5 10*3/uL — ABNORMAL LOW (ref 0.7–4.0)
MCH: 28.8 pg (ref 26.0–34.0)
MCHC: 30.3 g/dL (ref 30.0–36.0)
MCV: 95.2 fL (ref 80.0–100.0)
Monocytes Absolute: 0.1 10*3/uL (ref 0.1–1.0)
Monocytes Relative: 2 %
Neutro Abs: 5.2 10*3/uL (ref 1.7–7.7)
Neutrophils Relative %: 88 %
Platelets: 242 10*3/uL (ref 150–400)
RBC: 3.92 MIL/uL (ref 3.87–5.11)
RDW: 16.9 % — ABNORMAL HIGH (ref 11.5–15.5)
WBC: 5.9 10*3/uL (ref 4.0–10.5)
nRBC: 0 % (ref 0.0–0.2)

## 2022-03-21 LAB — BASIC METABOLIC PANEL
Anion gap: 8 (ref 5–15)
BUN: 11 mg/dL (ref 8–23)
CO2: 36 mmol/L — ABNORMAL HIGH (ref 22–32)
Calcium: 9.1 mg/dL (ref 8.9–10.3)
Chloride: 97 mmol/L — ABNORMAL LOW (ref 98–111)
Creatinine, Ser: 0.82 mg/dL (ref 0.44–1.00)
GFR, Estimated: >60 mL/min (ref >60)
Glucose, Bld: 108 mg/dL — ABNORMAL HIGH (ref 70–99)
Potassium: 4.5 mmol/L (ref 3.5–5.1)
Sodium: 141 mmol/L (ref 135–145)

## 2022-03-21 LAB — SEDIMENTATION RATE: Sed Rate: 30 mm/hr — ABNORMAL HIGH (ref 0–22)

## 2022-03-21 MED ORDER — CIPROFLOXACIN HCL 500 MG PO TABS: 500.0000 mg | ORAL_TABLET | Freq: Two times a day (BID) | ORAL | 0 refills | Status: AC

## 2022-03-21 MED ORDER — VANCOMYCIN HCL IN DEXTROSE 1-5 GM/200ML-% IV SOLN
1000.0000 mg | Freq: Once | INTRAVENOUS | Status: AC
  Administered 2022-03-21: 1000 mg via INTRAVENOUS
  Filled 2022-03-21: qty 200

## 2022-03-21 MED ORDER — IOHEXOL 300 MG/ML  SOLN
75.0000 mL | Freq: Once | INTRAMUSCULAR | Status: AC | PRN
Start: 2022-03-21 — End: 2022-03-21
  Administered 2022-03-21: 75 mL via INTRAVENOUS

## 2022-03-21 MED ORDER — SODIUM CHLORIDE 0.9 % IV SOLN
2.0000 g | Freq: Once | INTRAVENOUS | Status: AC
  Administered 2022-03-21: 2 g via INTRAVENOUS
  Filled 2022-03-21: qty 10

## 2022-03-21 MED ORDER — OFLOXACIN 0.3 % OT SOLN: 10.0000 [drp] | Freq: Every day | OTIC | 0 refills | Status: AC

## 2022-03-21 MED ORDER — IPRATROPIUM-ALBUTEROL 0.5-2.5 (3) MG/3ML IN SOLN
3.0000 mL | RESPIRATORY_TRACT | Status: AC
  Administered 2022-03-21 (×3): 3 mL via RESPIRATORY_TRACT
  Filled 2022-03-21: qty 9

## 2022-03-21 NOTE — ED Provider Notes (Signed)
[facility]-EMERGENCY DEPT Provider Note   CSN: [id number] Arrival date & time: 03/21/22  1520     History  Chief Complaint  Patient presents with   COPD    [name] is a 76 y.o. female.  HPI  76 year old female with medical history significant for CHF, CAD, HTN, COPD on 3 L O2 via nasal cannula at baseline, presenting to the emergency department with multiple complaints to include bilateral ear fullness and difficulty hearing, shortness of breath.  The patient continues to smoke cigarettes and utilizes nebulizer treatments at home.  She received 125 mg of Solu-Medrol and 10 mg of Albuterol and 0.5 mg of Atrovent with EMS. She primarily complains of fullness in her ears bilaterally.  She was seen by her PCP for this on 03/03/2022 and was diagnosed with chronic malignant otitis externa and started on ofloxacin and cortisporin drops.  She been taking the medication and has not seen improvement.  Her PCP recommended that she present to the emergency department for likely admission for IV antibiotics. She has not yet been seen by ENT.  Home Medications Prior to Admission medications   Medication Sig Start Date End Date Taking? Authorizing Provider  ALPRAZolam (XANAX) 1 MG tablet TAKE 1 TABLET BY MOUTH 3 TIMES DAILY AS NEEDED FOR ANXIETY. Patient taking differently: Take 1 mg by mouth 3 (three) times daily as needed for anxiety. 03/11/22  Yes Etta Grandchild, MD  ATROVENT HFA 17 MCG/ACT inhaler INHALE 2 PUFFS INTO THE LUNGS EVERY 4 HOURS AS NEEDED Patient taking differently: Inhale 2 puffs into the lungs every 4 (four) hours as needed for wheezing. 01/20/22  Yes Etta Grandchild, MD  butalbital-acetaminophen-caffeine (FIORICET) [phone] MG tablet Take 1 tablet by mouth every 4 (four) hours as needed for headache. 01/26/22  Yes Etta Grandchild, MD  cholecalciferol (VITAMIN D) 1000 units tablet Take 1,000 Units by mouth daily.   Yes [provider]  ciprofloxacin  (CIPRO) 500 MG tablet Take 1 tablet (500 mg total) by mouth 2 (two) times daily for 7 days. 03/21/22 03/28/22 Yes Ernie Avena, MD  diclofenac Sodium (VOLTAREN) 1 % GEL APPLY 2 GRAMS TO AFFECTED AREA 4 TIMES A DAY Patient taking differently: Apply 2 g topically 4 (four) times daily. 03/03/22  Yes Etta Grandchild, MD  furosemide (LASIX) 40 MG tablet TAKE 1 TABLET BY MOUTH TWICE A DAY Patient taking differently: Take 40 mg by mouth 2 (two) times daily. 02/25/22  Yes Etta Grandchild, MD  ipratropium-albuterol (DUONEB) 0.5-2.5 (3) MG/3ML SOLN Inhale 3 mLs into the lungs every 6 (six) hours as needed (for SOB, wheeze or cough). 02/02/21  Yes Etta Grandchild, MD  KLOR-CON M20 20 MEQ tablet TAKE 1 TABLET BY MOUTH EVERY DAY 11/20/21  Yes Etta Grandchild, MD  lactulose, encephalopathy, (ENULOSE) 10 GM/15ML SOLN Take 45 mLs (30 g total) by mouth 3 (three) times daily as needed (constipation). 01/26/22  Yes Etta Grandchild, MD  levocetirizine (XYZAL) 5 MG tablet TAKE 1 TABLET BY MOUTH EVERY DAY IN THE EVENING Patient taking differently: Take 5 mg by mouth every evening. 01/31/22  Yes Etta Grandchild, MD  LINZESS 72 MCG capsule TAKE 1 CAPSULE BY MOUTH EVERY DAY BEFORE BREAKFAST Patient taking differently: Take 72 mcg by mouth daily before breakfast. 11/20/21  Yes Etta Grandchild, MD  LIVALO 2 MG TABS TAKE 1 TABLET BY MOUTH EVERY DAY Patient taking differently: Take 2 mg by mouth daily at 12 noon. 02/17/22  Yes Hilty, Lisette Abu, MD  mometasone-formoterol (DULERA) 200-5 MCG/ACT AERO Inhale 2 puffs into the lungs 2 (two) times daily. 01/21/22  Yes Hunsucker, Lesia Sago, MD  montelukast (SINGULAIR) 10 MG tablet TAKE 1 TABLET BY MOUTH EVERYDAY AT BEDTIME Patient taking differently: Take 10 mg by mouth at bedtime. 02/25/22  Yes Etta Grandchild, MD  nortriptyline (PAMELOR) 25 MG capsule TAKE 2 CAPSULES BY MOUTH AT BEDTIME Patient taking differently: Take 50 mg by mouth at bedtime. 03/18/22  Yes Etta Grandchild, MD  ofloxacin (FLOXIN)  0.3 % OTIC solution Place 10 drops into both ears daily for 7 days. 03/21/22 03/28/22 Yes Ernie Avena, MD  Omega 3 1200 MG CAPS Take 1,200 mg by mouth every morning.    Yes [provider]  OXYGEN-HELIUM IN Inhale 3 L into the lungs See admin instructions. Uses when needed during the day, uses continuous throughout the night   Yes [provider]  pantoprazole (PROTONIX) 40 MG tablet Take 1 tablet (40 mg total) by mouth daily. 01/13/22  Yes Etta Grandchild, MD  polyvinyl alcohol (LIQUIFILM TEARS) 1.4 % ophthalmic solution Place 1 drop into both eyes as needed for dry eyes.   Yes [provider]  VENTOLIN HFA 108 (90 Base) MCG/ACT inhaler INHALE 1-2 PUFFS BY MOUTH EVERY 6 HOURS AS NEEDED FOR WHEEZE OR SHORTNESS OF BREATH Patient taking differently: Inhale 1-2 puffs into the lungs every 6 (six) hours as needed for wheezing or shortness of breath. 01/13/22  Yes Etta Grandchild, MD  vitamin B-12 (CYANOCOBALAMIN) 1000 MCG tablet Take 1 tablet (1,000 mcg total) by mouth daily. 03/09/22  Yes Etta Grandchild, MD  vitamin C (ASCORBIC ACID) 500 MG tablet Take 500 mg by mouth daily.   Yes [provider]  budesonide-formoterol (SYMBICORT) 160-4.5 MCG/ACT inhaler Inhale 2 puffs into the lungs 2 (two) times daily. Patient not taking: Reported on 03/21/2022 01/13/22   Hunsucker, Lesia Sago, MD  Fluticasone-Umeclidin-Vilant (TRELEGY ELLIPTA) 100-62.5-25 MCG/ACT AEPB Inhale 1 puff into the lungs daily. Patient not taking: Reported on 03/21/2022 01/07/22   Etta Grandchild, MD      Allergies    Ceftriaxone, Hydroxyzine, Doxycycline, and Lexapro [escitalopram]    Review of Systems   Review of Systems  Constitutional:  Negative for fever.  Eyes:  Negative for discharge.  All other systems reviewed and are negative.   Physical Exam Updated Vital Signs BP (!) 109/99   Pulse (!) 56   Temp 97.9 F (36.6 C) (Oral)   Resp 16   Wt 49.4 kg   SpO2 96%   BMI 19.94 kg/m  Physical  Exam Vitals and nursing note reviewed.  Constitutional:      General: She is not in acute distress.    Appearance: She is well-developed.  HENT:     Head: Normocephalic and atraumatic.     Comments: Left EAC with debris present, appears to be skin debris. Right EAC with evidence of continued otitis externa with possible cerumen impaction. Eyes:     Conjunctiva/sclera: Conjunctivae normal.  Cardiovascular:     Rate and Rhythm: Normal rate and regular rhythm.     Heart sounds: Murmur heard.  Pulmonary:     Effort: Pulmonary effort is normal. No respiratory distress.     Breath sounds: Wheezing present.  Abdominal:     Palpations: Abdomen is soft.     Tenderness: There is no abdominal tenderness.  Musculoskeletal:        General: Swelling present.  Cervical back: Neck supple.     Right lower leg: Edema present.     Left lower leg: Edema present.     Comments: Trace lower extremity edema bilaterally  Skin:    General: Skin is warm and dry.     Capillary Refill: Capillary refill takes less than 2 seconds.  Neurological:     Mental Status: She is alert.  Psychiatric:        Mood and Affect: Mood normal.    ED Results / Procedures / Treatments   Labs (all labs ordered are listed, but only abnormal results are displayed) Labs Reviewed  CBC WITH DIFFERENTIAL/PLATELET - Abnormal; Notable for the following components:      Result Value   Hemoglobin 11.3 (*)    RDW 16.9 (*)    Lymphs Abs 0.5 (*)    All other components within normal limits  BASIC METABOLIC PANEL - Abnormal; Notable for the following components:   Chloride 97 (*)    CO2 36 (*)    Glucose, Bld 108 (*)    All other components within normal limits  SEDIMENTATION RATE - Abnormal; Notable for the following components:   Sed Rate 30 (*)    All other components within normal limits  C-REACTIVE PROTEIN    EKG EKG Interpretation  Date/Time:  Sunday March 21 2022 15:37:22 EDT Ventricular Rate:  83 PR  Interval:  181 QRS Duration: 82 QT Interval:  366 QTC Calculation: 430 R Axis:   16 Text Interpretation: Sinus rhythm Probable left atrial enlargement Anteroseptal infarct, old No significant change since last tracing Confirmed by Ernie Avena ([id number]) on 03/21/2022 4:02:43 PM  Radiology CT Temporal Bones W Contrast  Result Date: 03/21/2022 CLINICAL DATA:  Mastoiditis EXAM: CT TEMPORAL BONES WITH CONTRAST TECHNIQUE: Axial and coronal plane CT imaging of the petrous temporal bones was performed with thin-collimation image reconstruction following intravenous contrast administration. Multiplanar CT image reconstructions were also generated. RADIATION DOSE REDUCTION: This exam was performed according to the departmental dose-optimization program which includes automated exposure control, adjustment of the mA and/or kV according to patient size and/or use of iterative reconstruction technique. CONTRAST:  75mL OMNIPAQUE IOHEXOL 300 MG/ML  SOLN COMPARISON:  None Available. FINDINGS: RIGHT TEMPORAL BONE External auditory canal: Partially opacified Middle ear cavity: Completely opacified. Ossicles are normal. There is blunting of the scutum. Tegmen tympani is intact. Inner ear structures: The cochlea, vestibule and semicircular canals are normal. The vestibular aqueduct is not enlarged. Internal auditory and facial nerve canals:  Normal Mastoid air cells: Complete opacification without coalescence LEFT TEMPORAL BONE External auditory canal: Partial opacification, possibly cerumen Middle ear cavity: Normally aerated. The scutum and ossicles are normal. The tegmen tympani is intact. Inner ear structures: The cochlea, vestibule and semicircular canals are normal. The vestibular aqueduct is not enlarged. Internal auditory and facial nerve canals:  Normal. Mastoid air cells: Normally aerated. No osseous erosion. Vascular: Normal non-contrast appearance of the carotid canals, jugular bulbs and sigmoid plates. Limited  intracranial:  No acute or significant finding. Visible orbits/paranasal sinuses: No acute or significant finding. Soft tissues: Normal. IMPRESSION: 1. Complete opacification of the right middle ear cavity and mastoid air cells with blunting of the scutum, consistent with otomastoiditis. No osseous erosion. Electronically Signed   By: Deatra Robinson M.D.   On: 03/21/2022 19:29   DG Chest Portable 1 View  Result Date: 03/21/2022 CLINICAL DATA:  COPD. EXAM: PORTABLE CHEST 1 VIEW COMPARISON:  June 01, 2021 FINDINGS: The heart size and mediastinal  contours are within normal limits. Both lungs are clear. The visualized skeletal structures are unremarkable. IMPRESSION: No active disease. Electronically Signed   By: Gerome Sam III M.D.   On: 03/21/2022 16:43    Procedures Procedures    Medications Ordered in ED Medications  ipratropium-albuterol (DUONEB) 0.5-2.5 (3) MG/3ML nebulizer solution 3 mL (3 mLs Nebulization Given 03/21/22 1710)  iohexol (OMNIPAQUE) 300 MG/ML solution 75 mL (75 mLs Intravenous Contrast Given 03/21/22 1824)  vancomycin (VANCOCIN) IVPB 1000 mg/200 mL premix (0 mg Intravenous Stopped 03/21/22 2318)  aztreonam (AZACTAM) 2 g in sodium chloride 0.9 % 100 mL IVPB (0 g Intravenous Stopped 03/21/22 2219)    ED Course/ Medical Decision Making/ A&P                           Medical Decision Making Amount and/or Complexity of Data Reviewed Labs: ordered. Radiology: ordered.  Risk Prescription drug management.   76 year old female with medical history significant for CHF, CAD, HTN, COPD on 3 L O2 via nasal cannula at baseline, presenting to the emergency department with multiple complaints to include bilateral ear fullness and difficulty hearing, shortness of breath.  The patient continues to smoke cigarettes and utilizes nebulizer treatments at home.  She received 125 mg of Solu-Medrol and 10 mg of Albuterol and 0.5 mg of Atrovent with EMS. She primarily complains of fullness  in her ears bilaterally.  She was seen by her PCP for this on 03/03/2022 and was diagnosed with chronic malignant otitis externa and started on ofloxacin and cortisporin drops.  She been taking the medication and has not seen improvement.  Her PCP recommended that she present to the emergency department for likely admission for IV antibiotics. She has not yet been seen by ENT.  On arrival, the patient was vitally stable. Afebrile, not septic, well appearing.  Persistent otitis externa noted in the right ear canal.  The patient has mild tenderness of the right mastoid process with no significant erythema or swelling.  Considered mastoiditis versus malignant otitis externa.  IV access was obtained and the patient was administered IV vancomycin and IV aztreonam given her previous anaphylactic allergy to Rocephin.  Screening laboratory work-up significant for CBC without a leukocytosis, anemia to 11.3, BMP with an elevated bicarbonate to 36, normal renal function, no other significant electrolyte abnormalities, CRP normal, ESR mildly elevated to 30.  CT of the temporal bones was performed which was concerning for otomastoiditis.  Dr. Christain Sacramento of ENT was consulted, recommendations pending.  Initially discussed admission plans with the patient and consulted Dr. Toniann Fail of hospitalist medicine for admission given the concern for possible mastoiditis.  Upon consultation with ENT, Dr. Christain Sacramento felt strongly that the patient did not have mastoiditis.  He instead recommended initiation of Ciprodex drops and Augmentin.  Given the patient's antibiotic allergies, will trial ciprofloxacin tablets and ofloxacin otic solution.  The patient has scheduled follow-up with ENT.  She was treated for her COPD with an albuterol nebulizer.  Low concern for acute exacerbation at this time.  No other acute complaints.  After discussion with both the on-call hospitalist, Dr. Toniann Fail, Dr. Suszanne Conners, there is no emergent indication for inpatient  hospitalization noted.  I discussed this with the patient, who had preferred admission for observation.  I again engaged hospitalist medicine but given the recommendations there was no immediate indication for inpatient admission..  We will plan for outpatient ENT follow-up for continued management.  Stable at time of  discharge.   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

## 2022-03-21 NOTE — Progress Notes (Addendum)
A consult was received from an ED physician for Vancomycin and Aztreonam per pharmacy dosing.  The patient's profile has been reviewed for ht/wt/allergies/indication/available labs. Of note, patient has a significant allergy to cephalosporins.  A one time order has been placed for  - Vancomycin 1gm IV - Aztreonman 2gm IV.   Further antibiotics/pharmacy consults should be ordered by admitting physician if indicated.                       Thank you, Caryl Asp, PharmD Clinical Pharmacist 03/21/2022 8:14 PM

## 2022-03-22 LAB — C-REACTIVE PROTEIN: CRP: 0.8 mg/dL (ref <1.0)

## 2022-03-23 ENCOUNTER — Telehealth: Payer: Self-pay | Admitting: Internal Medicine

## 2022-03-23 NOTE — Telephone Encounter (Signed)
LVM for pt to rtn my call to schedule AWV with [facility] call back # [phone] 

## 2022-03-23 NOTE — Telephone Encounter (Signed)
Called pharmacy w/update that med is approved

## 2022-03-27 NOTE — Telephone Encounter (Signed)
[name] VOB initiated via [email or web address]  Last [name] inj 11/18/21 Next [name] inj due 05/19/22

## 2022-03-31 ENCOUNTER — Encounter: Payer: Self-pay | Admitting: Internal Medicine

## 2022-04-02 ENCOUNTER — Other Ambulatory Visit: Payer: Self-pay | Admitting: Internal Medicine

## 2022-04-02 ENCOUNTER — Telehealth: Payer: Self-pay | Admitting: Internal Medicine

## 2022-04-02 DIAGNOSIS — B3731 Acute candidiasis of vulva and vagina: Secondary | ICD-10-CM | POA: Insufficient documentation

## 2022-04-02 MED ORDER — FLUCONAZOLE 150 MG PO TABS: 150.0000 mg | ORAL_TABLET | Freq: Every day | ORAL | 0 refills | Status: AC

## 2022-04-02 NOTE — Telephone Encounter (Signed)
LVM for pt to rtn my call to schedule AWV with [facility] call back # [phone] 

## 2022-04-03 ENCOUNTER — Encounter: Payer: Self-pay | Admitting: Pulmonary Disease

## 2022-04-03 ENCOUNTER — Other Ambulatory Visit: Payer: Self-pay | Admitting: Internal Medicine

## 2022-04-03 DIAGNOSIS — J9611 Chronic respiratory failure with hypoxia: Secondary | ICD-10-CM

## 2022-04-03 DIAGNOSIS — J438 Other emphysema: Secondary | ICD-10-CM

## 2022-04-05 MED ORDER — MOMETASONE FURO-FORMOTEROL FUM 200-5 MCG/ACT IN AERO: 2.0000 | INHALATION_SPRAY | Freq: Two times a day (BID) | RESPIRATORY_TRACT | 6 refills | Status: DC

## 2022-04-05 NOTE — Telephone Encounter (Signed)
Please see other encounter.

## 2022-04-05 NOTE — Telephone Encounter (Signed)
PLease see other encounter

## 2022-04-08 ENCOUNTER — Other Ambulatory Visit: Payer: Self-pay | Admitting: Internal Medicine

## 2022-04-08 DIAGNOSIS — G43009 Migraine without aura, not intractable, without status migrainosus: Secondary | ICD-10-CM

## 2022-04-09 ENCOUNTER — Emergency Department (HOSPITAL_COMMUNITY): Payer: Medicare Other

## 2022-04-09 ENCOUNTER — Inpatient Hospital Stay (HOSPITAL_COMMUNITY)
Admission: EM | Admit: 2022-04-09 | Discharge: 2022-04-12 | DRG: 144 | Disposition: A | Discharge status: Home / Self Care | Payer: Medicare Other | Attending: Internal Medicine | Admitting: Internal Medicine

## 2022-04-09 DIAGNOSIS — H70001 Acute mastoiditis without complications, right ear: Secondary | ICD-10-CM | POA: Diagnosis present

## 2022-04-09 DIAGNOSIS — Z803 Family history of malignant neoplasm of breast: Secondary | ICD-10-CM

## 2022-04-09 DIAGNOSIS — F411 Generalized anxiety disorder: Secondary | ICD-10-CM | POA: Diagnosis present

## 2022-04-09 DIAGNOSIS — I350 Nonrheumatic aortic (valve) stenosis: Secondary | ICD-10-CM | POA: Diagnosis present

## 2022-04-09 DIAGNOSIS — I272 Pulmonary hypertension, unspecified: Secondary | ICD-10-CM | POA: Diagnosis present

## 2022-04-09 DIAGNOSIS — Z820 Family history of epilepsy and other diseases of the nervous system: Secondary | ICD-10-CM

## 2022-04-09 DIAGNOSIS — I2510 Atherosclerotic heart disease of native coronary artery without angina pectoris: Secondary | ICD-10-CM | POA: Diagnosis present

## 2022-04-09 DIAGNOSIS — K219 Gastro-esophageal reflux disease without esophagitis: Secondary | ICD-10-CM | POA: Diagnosis present

## 2022-04-09 DIAGNOSIS — T161XXA Foreign body in right ear, initial encounter: Secondary | ICD-10-CM | POA: Diagnosis present

## 2022-04-09 DIAGNOSIS — I5032 Chronic diastolic (congestive) heart failure: Secondary | ICD-10-CM | POA: Diagnosis present

## 2022-04-09 DIAGNOSIS — J449 Chronic obstructive pulmonary disease, unspecified: Secondary | ICD-10-CM | POA: Diagnosis present

## 2022-04-09 DIAGNOSIS — J411 Mucopurulent chronic bronchitis: Secondary | ICD-10-CM

## 2022-04-09 DIAGNOSIS — Z880 Allergy status to penicillin: Secondary | ICD-10-CM

## 2022-04-09 DIAGNOSIS — F17210 Nicotine dependence, cigarettes, uncomplicated: Secondary | ICD-10-CM | POA: Diagnosis present

## 2022-04-09 DIAGNOSIS — R42 Dizziness and giddiness: Secondary | ICD-10-CM

## 2022-04-09 DIAGNOSIS — H938X1 Other specified disorders of right ear: Secondary | ICD-10-CM | POA: Diagnosis present

## 2022-04-09 DIAGNOSIS — E785 Hyperlipidemia, unspecified: Secondary | ICD-10-CM | POA: Diagnosis present

## 2022-04-09 DIAGNOSIS — Z818 Family history of other mental and behavioral disorders: Secondary | ICD-10-CM

## 2022-04-09 DIAGNOSIS — I110 Hypertensive heart disease with heart failure: Secondary | ICD-10-CM | POA: Diagnosis present

## 2022-04-09 DIAGNOSIS — Z87448 Personal history of other diseases of urinary system: Secondary | ICD-10-CM | POA: Diagnosis not present

## 2022-04-09 DIAGNOSIS — Z955 Presence of coronary angioplasty implant and graft: Secondary | ICD-10-CM | POA: Diagnosis not present

## 2022-04-09 DIAGNOSIS — M19042 Primary osteoarthritis, left hand: Secondary | ICD-10-CM | POA: Diagnosis present

## 2022-04-09 DIAGNOSIS — H7091 Unspecified mastoiditis, right ear: Principal | ICD-10-CM

## 2022-04-09 DIAGNOSIS — H6691 Otitis media, unspecified, right ear: Secondary | ICD-10-CM

## 2022-04-09 DIAGNOSIS — M19041 Primary osteoarthritis, right hand: Secondary | ICD-10-CM | POA: Diagnosis present

## 2022-04-09 DIAGNOSIS — Z881 Allergy status to other antibiotic agents status: Secondary | ICD-10-CM

## 2022-04-09 DIAGNOSIS — H6021 Malignant otitis externa, right ear: Secondary | ICD-10-CM | POA: Diagnosis present

## 2022-04-09 DIAGNOSIS — Z888 Allergy status to other drugs, medicaments and biological substances status: Secondary | ICD-10-CM

## 2022-04-09 DIAGNOSIS — Z1889 Other specified retained foreign body fragments: Secondary | ICD-10-CM | POA: Diagnosis not present

## 2022-04-09 DIAGNOSIS — H6690 Otitis media, unspecified, unspecified ear: Secondary | ICD-10-CM

## 2022-04-09 DIAGNOSIS — Z853 Personal history of malignant neoplasm of breast: Secondary | ICD-10-CM

## 2022-04-09 DIAGNOSIS — Z8249 Family history of ischemic heart disease and other diseases of the circulatory system: Secondary | ICD-10-CM

## 2022-04-09 DIAGNOSIS — I10 Essential (primary) hypertension: Secondary | ICD-10-CM | POA: Diagnosis present

## 2022-04-09 DIAGNOSIS — Z8349 Family history of other endocrine, nutritional and metabolic diseases: Secondary | ICD-10-CM

## 2022-04-09 DIAGNOSIS — I252 Old myocardial infarction: Secondary | ICD-10-CM

## 2022-04-09 DIAGNOSIS — H70009 Acute mastoiditis without complications, unspecified ear: Secondary | ICD-10-CM | POA: Diagnosis present

## 2022-04-09 DIAGNOSIS — Z9981 Dependence on supplemental oxygen: Secondary | ICD-10-CM | POA: Diagnosis not present

## 2022-04-09 DIAGNOSIS — Z833 Family history of diabetes mellitus: Secondary | ICD-10-CM

## 2022-04-09 DIAGNOSIS — Z9049 Acquired absence of other specified parts of digestive tract: Secondary | ICD-10-CM

## 2022-04-09 DIAGNOSIS — J9611 Chronic respiratory failure with hypoxia: Secondary | ICD-10-CM | POA: Diagnosis present

## 2022-04-09 LAB — CBC WITH DIFFERENTIAL/PLATELET
Abs Immature Granulocytes: 0.01 10*3/uL (ref 0.00–0.07)
Basophils Absolute: 0.1 10*3/uL (ref 0.0–0.1)
Basophils Relative: 1 %
Eosinophils Absolute: 0 10*3/uL (ref 0.0–0.5)
Eosinophils Relative: 0 %
HCT: 35.8 % — ABNORMAL LOW (ref 36.0–46.0)
Hemoglobin: 10.9 g/dL — ABNORMAL LOW (ref 12.0–15.0)
Immature Granulocytes: 0 %
Lymphocytes Relative: 23 %
Lymphs Abs: 1.2 10*3/uL (ref 0.7–4.0)
MCH: 28.2 pg (ref 26.0–34.0)
MCHC: 30.4 g/dL (ref 30.0–36.0)
MCV: 92.7 fL (ref 80.0–100.0)
Monocytes Absolute: 0.4 10*3/uL (ref 0.1–1.0)
Monocytes Relative: 7 %
Neutro Abs: 3.6 10*3/uL (ref 1.7–7.7)
Neutrophils Relative %: 69 %
Platelets: 246 10*3/uL (ref 150–400)
RBC: 3.86 MIL/uL — ABNORMAL LOW (ref 3.87–5.11)
RDW: 15.5 % (ref 11.5–15.5)
WBC: 5.3 10*3/uL (ref 4.0–10.5)
nRBC: 0 % (ref 0.0–0.2)

## 2022-04-09 LAB — COMPREHENSIVE METABOLIC PANEL
ALT: 13 U/L (ref 0–44)
AST: 21 U/L (ref 15–41)
Albumin: 3.5 g/dL (ref 3.5–5.0)
Alkaline Phosphatase: 45 U/L (ref 38–126)
Anion gap: 8 (ref 5–15)
BUN: 9 mg/dL (ref 8–23)
CO2: 35 mmol/L — ABNORMAL HIGH (ref 22–32)
Calcium: 9.4 mg/dL (ref 8.9–10.3)
Chloride: 94 mmol/L — ABNORMAL LOW (ref 98–111)
Creatinine, Ser: 0.67 mg/dL (ref 0.44–1.00)
GFR, Estimated: >60 mL/min (ref >60)
Glucose, Bld: 88 mg/dL (ref 70–99)
Potassium: 4.4 mmol/L (ref 3.5–5.1)
Sodium: 137 mmol/L (ref 135–145)
Total Bilirubin: 0.4 mg/dL (ref 0.3–1.2)
Total Protein: 6.8 g/dL (ref 6.5–8.1)

## 2022-04-09 LAB — I-STAT CHEM 8, ED
BUN: 7 mg/dL — ABNORMAL LOW (ref 8–23)
Calcium, Ion: 1.15 mmol/L (ref 1.15–1.40)
Chloride: 92 mmol/L — ABNORMAL LOW (ref 98–111)
Creatinine, Ser: 0.9 mg/dL (ref 0.44–1.00)
Glucose, Bld: 82 mg/dL (ref 70–99)
HCT: 34 % — ABNORMAL LOW (ref 36.0–46.0)
Hemoglobin: 11.6 g/dL — ABNORMAL LOW (ref 12.0–15.0)
Potassium: 4.4 mmol/L (ref 3.5–5.1)
Sodium: 133 mmol/L — ABNORMAL LOW (ref 135–145)
TCO2: 40 mmol/L — ABNORMAL HIGH (ref 22–32)

## 2022-04-09 MED ORDER — OFLOXACIN 0.3 % OP SOLN
5.0000 [drp] | Freq: Every day | OPHTHALMIC | Status: DC
  Administered 2022-04-09 – 2022-04-10 (×2): 5 [drp] via OTIC
  Filled 2022-04-09 (×2): qty 5

## 2022-04-09 MED ORDER — OMEGA-3-ACID ETHYL ESTERS 1 G PO CAPS
1000.0000 mg | ORAL_CAPSULE | Freq: Every morning | ORAL | Status: DC
  Administered 2022-04-10 – 2022-04-12 (×3): 1000 mg via ORAL
  Filled 2022-04-09 (×3): qty 1

## 2022-04-09 MED ORDER — AZTREONAM 2 G IJ SOLR
2.0000 g | Freq: Once | INTRAMUSCULAR | Status: AC
Start: 2022-04-09 — End: 2022-04-09
  Administered 2022-04-09: 2 g via INTRAVENOUS
  Filled 2022-04-09: qty 2

## 2022-04-09 MED ORDER — LEVOCETIRIZINE DIHYDROCHLORIDE 5 MG PO TABS: 5.0000 mg | ORAL_TABLET | Freq: Every evening | ORAL | Status: DC

## 2022-04-09 MED ORDER — MECLIZINE HCL 25 MG PO TABS
25.0000 mg | ORAL_TABLET | Freq: Once | ORAL | Status: AC
  Administered 2022-04-09: 25 mg via ORAL
  Filled 2022-04-09: qty 1

## 2022-04-09 MED ORDER — PRAVASTATIN SODIUM 40 MG PO TABS
40.0000 mg | ORAL_TABLET | Freq: Every day | ORAL | Status: DC
  Administered 2022-04-10 – 2022-04-11 (×2): 40 mg via ORAL
  Filled 2022-04-09 (×2): qty 1

## 2022-04-09 MED ORDER — VANCOMYCIN HCL IN DEXTROSE 1-5 GM/200ML-% IV SOLN
1000.0000 mg | Freq: Once | INTRAVENOUS | Status: AC
  Administered 2022-04-09: 1000 mg via INTRAVENOUS
  Filled 2022-04-09: qty 200

## 2022-04-09 MED ORDER — ALPRAZOLAM 0.5 MG PO TABS
1.0000 mg | ORAL_TABLET | Freq: Three times a day (TID) | ORAL | Status: DC | PRN
Start: 2022-04-09 — End: 2022-04-12
  Administered 2022-04-10 – 2022-04-11 (×4): 1 mg via ORAL
  Filled 2022-04-09 (×4): qty 2

## 2022-04-09 MED ORDER — SODIUM CHLORIDE 0.9 % IV SOLN
2.0000 g | Freq: Three times a day (TID) | INTRAVENOUS | Status: DC
  Filled 2022-04-09 (×2): qty 10

## 2022-04-09 MED ORDER — FLUTICASONE FUROATE-VILANTEROL 100-25 MCG/ACT IN AEPB
1.0000 | INHALATION_SPRAY | Freq: Every day | RESPIRATORY_TRACT | Status: DC
  Administered 2022-04-10 – 2022-04-11 (×2): 1 via RESPIRATORY_TRACT
  Filled 2022-04-09 (×2): qty 28

## 2022-04-09 MED ORDER — SODIUM CHLORIDE 0.9 % IV SOLN
1.0000 g | Freq: Three times a day (TID) | INTRAVENOUS | Status: DC
  Administered 2022-04-10 – 2022-04-12 (×7): 1 g via INTRAVENOUS
  Filled 2022-04-09 (×12): qty 5

## 2022-04-09 MED ORDER — SODIUM CHLORIDE (PF) 0.9 % IJ SOLN
INTRAMUSCULAR | Status: AC
  Filled 2022-04-09: qty 50

## 2022-04-09 MED ORDER — MONTELUKAST SODIUM 10 MG PO TABS
10.0000 mg | ORAL_TABLET | Freq: Every day | ORAL | Status: DC
  Administered 2022-04-09 – 2022-04-11 (×3): 10 mg via ORAL
  Filled 2022-04-09 (×3): qty 1

## 2022-04-09 MED ORDER — VITAMIN B-12 1000 MCG PO TABS
1000.0000 ug | ORAL_TABLET | Freq: Every day | ORAL | Status: DC
  Administered 2022-04-10 – 2022-04-12 (×3): 1000 ug via ORAL
  Filled 2022-04-09 (×3): qty 1

## 2022-04-09 MED ORDER — LINACLOTIDE 72 MCG PO CAPS
72.0000 ug | ORAL_CAPSULE | Freq: Every day | ORAL | Status: DC
  Administered 2022-04-11 – 2022-04-12 (×2): 72 ug via ORAL
  Filled 2022-04-09 (×3): qty 1

## 2022-04-09 MED ORDER — GADOBUTROL 1 MMOL/ML IV SOLN
5.0000 mL | Freq: Once | INTRAVENOUS | Status: AC | PRN
  Administered 2022-04-09: 5 mL via INTRAVENOUS

## 2022-04-09 MED ORDER — MOMETASONE FURO-FORMOTEROL FUM 200-5 MCG/ACT IN AERO
2.0000 | INHALATION_SPRAY | Freq: Two times a day (BID) | RESPIRATORY_TRACT | Status: DC
  Administered 2022-04-09 – 2022-04-12 (×6): 2 via RESPIRATORY_TRACT
  Filled 2022-04-09 (×2): qty 8.8

## 2022-04-09 MED ORDER — LORATADINE 10 MG PO TABS
10.0000 mg | ORAL_TABLET | Freq: Every day | ORAL | Status: DC
  Administered 2022-04-09 – 2022-04-11 (×3): 10 mg via ORAL
  Filled 2022-04-09 (×3): qty 1

## 2022-04-09 MED ORDER — ALBUTEROL SULFATE HFA 108 (90 BASE) MCG/ACT IN AERS
2.0000 | INHALATION_SPRAY | Freq: Once | RESPIRATORY_TRACT | Status: AC
  Administered 2022-04-09: 2 via RESPIRATORY_TRACT
  Filled 2022-04-09: qty 6.7

## 2022-04-09 MED ORDER — IPRATROPIUM-ALBUTEROL 0.5-2.5 (3) MG/3ML IN SOLN
3.0000 mL | Freq: Four times a day (QID) | RESPIRATORY_TRACT | Status: DC | PRN
Start: 2022-04-09 — End: 2022-04-12

## 2022-04-09 MED ORDER — IOHEXOL 300 MG/ML  SOLN
75.0000 mL | Freq: Once | INTRAMUSCULAR | Status: AC | PRN
  Administered 2022-04-09: 75 mL via INTRAVENOUS

## 2022-04-09 MED ORDER — POTASSIUM CHLORIDE CRYS ER 20 MEQ PO TBCR
20.0000 meq | EXTENDED_RELEASE_TABLET | Freq: Every day | ORAL | Status: DC
Start: 2022-04-10 — End: 2022-04-12
  Administered 2022-04-10 – 2022-04-12 (×3): 20 meq via ORAL
  Filled 2022-04-09 (×3): qty 1

## 2022-04-09 MED ORDER — BUTALBITAL-APAP-CAFFEINE 50-325-40 MG PO TABS
1.0000 | ORAL_TABLET | ORAL | Status: DC | PRN
  Administered 2022-04-11: 1 via ORAL
  Filled 2022-04-09: qty 1

## 2022-04-09 MED ORDER — ASCORBIC ACID 500 MG PO TABS
500.0000 mg | ORAL_TABLET | Freq: Every day | ORAL | Status: DC
  Administered 2022-04-10 – 2022-04-12 (×3): 500 mg via ORAL
  Filled 2022-04-09 (×3): qty 1

## 2022-04-09 MED ORDER — PANTOPRAZOLE SODIUM 40 MG PO TBEC
40.0000 mg | DELAYED_RELEASE_TABLET | Freq: Every day | ORAL | Status: DC
  Administered 2022-04-10 – 2022-04-12 (×3): 40 mg via ORAL
  Filled 2022-04-09 (×3): qty 1

## 2022-04-09 MED ORDER — VITAMIN D 25 MCG (1000 UNIT) PO TABS
1000.0000 [IU] | ORAL_TABLET | Freq: Every day | ORAL | Status: DC
  Administered 2022-04-10 – 2022-04-12 (×3): 1000 [IU] via ORAL
  Filled 2022-04-09 (×3): qty 1

## 2022-04-09 MED ORDER — FUROSEMIDE 40 MG PO TABS
40.0000 mg | ORAL_TABLET | Freq: Two times a day (BID) | ORAL | Status: DC
  Administered 2022-04-10 – 2022-04-12 (×5): 40 mg via ORAL
  Filled 2022-04-09 (×5): qty 1

## 2022-04-09 MED ORDER — LORAZEPAM 2 MG/ML IJ SOLN: 1.0000 mg | Freq: Once | INTRAMUSCULAR | Status: DC | PRN

## 2022-04-09 MED ORDER — VANCOMYCIN HCL IN DEXTROSE 1-5 GM/200ML-% IV SOLN: 1000.0000 mg | INTRAVENOUS | Status: DC

## 2022-04-09 MED ORDER — UMECLIDINIUM BROMIDE 62.5 MCG/ACT IN AEPB
1.0000 | INHALATION_SPRAY | Freq: Every day | RESPIRATORY_TRACT | Status: DC
  Administered 2022-04-10 – 2022-04-11 (×2): 1 via RESPIRATORY_TRACT
  Filled 2022-04-09 (×2): qty 7

## 2022-04-09 MED ORDER — NORTRIPTYLINE HCL 25 MG PO CAPS
50.0000 mg | ORAL_CAPSULE | Freq: Every day | ORAL | Status: DC
  Administered 2022-04-09 – 2022-04-11 (×3): 50 mg via ORAL
  Filled 2022-04-09 (×5): qty 2

## 2022-04-09 NOTE — ED Notes (Signed)
Patient transported to CT 

## 2022-04-09 NOTE — H&P (Signed)
History and Physical    Patient: [name] DOB: [birth year] DOA: 04/09/2022 DOS: the patient was seen and examined on 04/10/2022 PCP: [name], MD  Patient coming from: Home  Chief Complaint:  Chief Complaint  Patient presents with   Otalgia   Dizziness   HPI: [name] is a 76 y.o. 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   She was recently diagnosed with chronic otitis externa and started on ofloxacin and Cortisporin drops by PCP on 03/03/2022. She was evaluated at ED on 03/21/2022 with CT of the temporal bone performed concerning for otomastoiditis.Dr. Melene Plan of ENT was consulted and felt strongly that pt  did not have mastoiditis and recommended initiation of Ciprodex drops and Augmentin.  Given her antibiotic allergy, she was given ciprofloxacin and ofloxacin otic solution.  She was advised to follow-up with ENT which she has not done. She continues to have pain to right ear with slight hearing loss. Has severe vertigo and would fall forward each time she tries to get up.  In the ED, she was afebrile, mildly hypertensive and on home 3 L via nasal cannula.  No leukocytosis, hemoglobin at baseline at 10.9.  No significant electrolyte abnormalities.  CT temporal bone now showing acute mastoiditis and aggressive otitis externa of the right ear.  EDP discussed with ENT Dr. Janace Hoard and wants her to be transferred to [facility] for ear tube placement and to continue on IV antibiotics.  Review of Systems: As mentioned in the history of present illness. All other systems reviewed and are negative. Past Medical History:  Diagnosis Date   Abnormal LFTs 08/02/2011   Acute liver failure 06/19/2013   Acute renal failure ([place]) 06/19/2013   Acute respiratory failure ([place])  06/19/2013   Altered mental status 08/01/2011   Angina    Anxiety    Anxiety state, unspecified 12/03/2013   Aortic stenosis    mild   Arthritis    "hands" (02/03/2017)   Breast cancer, right breast ([place]) dx'd 2008   CAD (coronary artery disease) of artery bypass graft 11/06/2013   CAD (coronary artery disease), MI R/O 08/01/2011   PCI in 2006 (bare metal stent, unknown artery) - [place]    CAP (community acquired pneumonia) 09/11/2018   CHF,  acute diastolic, BNP 4k on admissio 08/01/2011   Chronic bronchitis (HCC)    Chronic respiratory failure ([place]) 02/02/2012   Compression fracture of L1 lumbar vertebra ([place]) 02/02/2012   COPD (chronic obstructive pulmonary disease) ([place])    oxygen-dependent 4LPM [place]   Coronary artery disease 2006   2 stents w/previous MI   Depressed    Diastolic CHF ([place])    Dyslipidemia    Family history of adverse reaction to anesthesia    "daughter had c-section; missed twice w/epidural"   GERD (gastroesophageal reflux disease)    Heart murmur    History of blood transfusion    "w/my colon OR"   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   History of nuclear stress test 08/03/2011   attenuation at apex - no perfusion defects    HTN (hypertension) 11/06/2013   Hyperkalemia, on ACE prior to admission 11/11/2011   Hypertension    Hyponatremia 01/31/2012   Migraine    "qod to q couple months since I was 21" (02/03/2017)   Mild aortic  stenosis 08/01/2011   AVA 1.69 cm2 (06/02/2011)    Moderate to severe pulmonary hypertension (HCC)    NSTEMI (non-ST elevated myocardial infarction) ([place]) 2006   NSVT (nonsustained ventricular tachycardia) (HCC)    h/o   On home oxygen therapy    "3L just at night; have it available prn duringtheday" (09/11/2018)   Pneumonia    "alot of times" (02/03/2017)   SBO (small bowel obstruction) ([place]) 04/23/2020   PARTIAL    Past Surgical History:  Procedure Laterality Date   BREAST BIOPSY Right 2008   BREAST LUMPECTOMY Right 2008   malignant    CATARACT EXTRACTION W/ INTRAOCULAR LENS  IMPLANT, BILATERAL Bilateral ~ 2013   [place]; 1971; [place]  11/2007   COLOSTOMY CLOSURE  07/2008   CORONARY ANGIOPLASTY WITH STENT PLACEMENT  2006   "2 stents"   ERCP N/A 09/05/2015   Procedure: ENDOSCOPIC RETROGRADE CHOLANGIOPANCREATOGRAPHY (ERCP);  Surgeon: Ladene Artist, MD;  Location: [facility];  Service: Endoscopy;  Laterality: N/A;   PARTIAL COLECTOMY  2009   for obstruction: temporary ostomy, later reversed.    RIGHT HEART CATHETERIZATION N/A 09/05/2013   Procedure: RIGHT HEART CATH;  Surgeon: Jolaine Artist, MD;  Location: [facility] CATH LAB;  Service: Cardiovascular;  Laterality: N/A;   TRANSTHORACIC ECHOCARDIOGRAM  11/12/2011   EF 07-62%, normal systolic function, grade 1 diastolic dysfunction; ventricular septal flattening (D-sign); mild AS; trace-mild MR; LA mildly dilated; RV mod dilated; RA mod dilated; severe pulm HTN; elevated CVP   Social History:  reports that she has been smoking cigarettes. She has a 26.50 pack-year smoking history. She has never used smokeless tobacco. She reports current alcohol use of about 1.0 standard drink of alcohol per week. She reports that she does not use drugs.  Allergies  Allergen Reactions   Ceftriaxone Anaphylaxis and Other (See Comments)    *ROCEPHIN*  "Blow up like a balloon"   Hydroxyzine Shortness Of Breath and Other (See Comments)    Pt states med make her light headed, get sob sxs   Doxycycline Nausea And Vomiting   Lexapro [Escitalopram] Other (See Comments)    Pt states med make her dizzy    Family History  Problem Relation Age of Onset   Alzheimer's disease Mother    Schizophrenia Sister    Breast cancer Sister        51s   Heart disease Sister    Diabetes Sister    Hyperlipidemia Brother    Cancer Neg Hx    Stroke Neg Hx    COPD Neg Hx    Depression Neg Hx    Drug abuse Neg Hx    Early death Neg Hx     Hypertension Neg Hx    Kidney disease Neg Hx     Prior to Admission medications   Medication Sig Start Date End Date Taking? Authorizing Provider  ALPRAZolam (XANAX) 1 MG tablet TAKE 1 TABLET BY MOUTH 3 TIMES DAILY AS NEEDED FOR ANXIETY. Patient taking differently: Take 1 mg by mouth 3 (three) times daily as needed for anxiety. 03/11/22   [name], MD  ATROVENT HFA 17 MCG/ACT inhaler INHALE 2 PUFFS INTO THE LUNGS EVERY 4 HOURS AS NEEDED Patient taking differently: Inhale 2 puffs into the lungs every 4 (four) hours as needed for wheezing. 01/20/22   [name], MD  budesonide-formoterol [facility]) 160-4.5 MCG/ACT inhaler Inhale 2 puffs  into the lungs 2 (two) times daily. Patient not taking: Reported on 03/21/2022 01/13/22   Hunsucker, Bonna Gains, MD  butalbital-acetaminophen-caffeine (FIORICET) 50-325-40 MG tablet TAKE 1 TABLET BY MOUTH EVERY 4 HOURS AS NEEDED FOR HEADACHE. Patient taking differently: Take 1 tablet by mouth every 4 (four) hours as needed for headache. 04/08/22   [name], MD  cholecalciferol (VITAMIN D) 1000 units tablet Take 1,000 Units by mouth daily.    [provider]  diclofenac Sodium (VOLTAREN) 1 % GEL APPLY 2 GRAMS TO AFFECTED AREA 4 TIMES A DAY Patient taking differently: Apply 2 g topically 4 (four) times daily. 03/03/22   [name], MD  fluconazole (DIFLUCAN) 150 MG tablet Take 1 tablet (150 mg total) by mouth daily for 7 days. 04/02/22 04/09/22  [name], MD  Fluticasone-Umeclidin-Vilant (TRELEGY ELLIPTA) 100-62.5-25 MCG/ACT AEPB Inhale 1 puff into the lungs daily. Patient not taking: Reported on 03/21/2022 01/07/22   [name], MD  furosemide (LASIX) 40 MG tablet TAKE 1 TABLET BY MOUTH TWICE A DAY Patient taking differently: Take 40 mg by mouth 2 (two) times daily. 02/25/22   [name], MD  ipratropium-albuterol (DUONEB) 0.5-2.5 (3) MG/3ML SOLN INHALE 3 MLS INTO THE LUNGS EVERY 6 HOURS AS NEEDED (FOR FOR SHORTNESS OF BREATH  [place] OR COUGH). Patient taking differently: Inhale 3 mLs into the lungs every 6 (six) hours as needed (shortness of breath). 04/03/22   [name], MD  KLOR-CON M20 20 MEQ tablet TAKE 1 TABLET BY MOUTH EVERY DAY Patient taking differently: Take 20 mEq by mouth daily. 11/20/21   [name], MD  lactulose, encephalopathy, (ENULOSE) 10 GM/15ML SOLN Take 45 mLs (30 g total) by mouth 3 (three) times daily as needed (constipation). 01/26/22   [name], MD  levocetirizine (XYZAL) 5 MG tablet TAKE 1 TABLET BY MOUTH EVERY DAY IN THE EVENING Patient taking differently: Take 5 mg by mouth every evening. 01/31/22   [name], MD  LINZESS 72 MCG capsule TAKE 1 CAPSULE BY MOUTH EVERY DAY BEFORE BREAKFAST Patient taking differently: Take 72 mcg by mouth daily before breakfast. 11/20/21   [name], MD  LIVALO 2 MG TABS TAKE 1 TABLET BY MOUTH EVERY DAY Patient taking differently: Take 2 mg by mouth daily at 12 noon. 02/17/22   Hilty, Nadean Corwin, MD  mometasone-formoterol (DULERA) 200-5 MCG/ACT AERO Inhale 2 puffs into the lungs 2 (two) times daily. 04/05/22   Hunsucker, Bonna Gains, MD  montelukast (SINGULAIR) 10 MG tablet TAKE 1 TABLET BY MOUTH EVERYDAY AT BEDTIME Patient taking differently: Take 10 mg by mouth at bedtime. 02/25/22   [name], MD  nortriptyline (PAMELOR) 25 MG capsule TAKE 2 CAPSULES BY MOUTH AT BEDTIME Patient taking differently: Take 50 mg by mouth at bedtime. 03/18/22   [name], MD  Omega 3 1200 MG CAPS Take 1,200 mg by mouth every morning.     [provider]  OXYGEN-HELIUM IN Inhale 3 L into the lungs See admin instructions. Uses when needed during the day, uses continuous throughout the night    [provider]  pantoprazole (PROTONIX) 40 MG tablet Take 1 tablet (40 mg total) by mouth daily. 01/13/22   [name], MD  polyvinyl alcohol (LIQUIFILM TEARS) 1.4 % ophthalmic solution Place 1 drop into both eyes as needed for dry eyes.     [provider]  VENTOLIN HFA 108 (90 Base) MCG/ACT inhaler INHALE 1-2 PUFFS BY  MOUTH EVERY 6 HOURS AS NEEDED FOR WHEEZE OR SHORTNESS OF BREATH Patient taking differently: Inhale 1-2 puffs into the lungs every 6 (six) hours as needed for wheezing or shortness of breath. 01/13/22   [name], MD  vitamin B-12 (CYANOCOBALAMIN) 1000 MCG tablet Take 1 tablet (1,000 mcg total) by mouth daily. 03/09/22   [name], MD  vitamin C (ASCORBIC ACID) 500 MG tablet Take 500 mg by mouth daily.    [provider]    Physical Exam: Vitals:   04/09/22 2100 04/09/22 2330 04/09/22 2354 04/10/22 0000  BP: (!) 165/65 (!) 151/78  (!) 165/151  Pulse: 74   78  Resp: '16 12  14  '$ Temp:   97.8 F (36.6 C)   TempSrc:   Oral   SpO2: 93%   97%  Weight:      Height:       Constitutional: NAD, calm, comfortable, chronically ill-appearing thin cachectic female laying in bed Eyes: lids and conjunctivae normal ENMT: Mucous membranes are moist.  Erythema noted to external ear bilaterally. Unable to visualize left tympanic membrane due to cerumen.  Right ear with mild erythema to external canal and opacity of the tympanic membrane. Neck: normal, supple,  Respiratory: Diffuse expiratory wheezing, no crackles. Normal respiratory effort on home baseline 3 L. No accessory muscle use.  Cardiovascular: Regular rate and rhythm, no murmurs / rubs / gallops. No extremity edema.  Abdomen: Soft, nondistended, no tenderness, no masses palpated. Bowel sounds positive.  Musculoskeletal: no clubbing / cyanosis. No joint deformity upper and lower extremities. Muscle wasting throughout. Skin: no rashes, lesions, ulcers.  Neurologic: CN 2-12 grossly intact.  Strength 5/5 in all 4.  Psychiatric: Normal judgment and insight. Alert and oriented x 3. Normal mood. 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 Case was discussed with Dr. Janace Hoard by EDP. Keep n.p.o. past midnight.  Vertigo Most probable due to her acute mastoiditis, otitis media with middle ear effusion. -MRI of the brain is normal without any evidence of intracranial spread of infection  HTN (hypertension) Mildly elevated but likely secondary to pain.  Chronic respiratory failure with hypoxia (HCC) In the setting of COPD on 3 L.  Remained stable and not in acute exacerbation.  Continue home bronchodilator.  Coronary artery disease involving native coronary artery of native heart without angina pectoris Stable.  Continue statin.  Chronic diastolic CHF (congestive heart failure) (HCC) Euvolemic on exam.  Continue home daily Lasix.      Advance Care Planning:   Code Status: Full Code Full  Consults: ENT Dr. Janace Hoard  Family Communication: none at bedside  Severity of Illness: The appropriate patient status for this patient is INPATIENT. Inpatient status is judged to be reasonable and necessary in order to provide the required intensity of service to ensure the patient's safety. The patient's presenting symptoms, physical exam findings, and initial radiographic and laboratory data in the context of their chronic comorbidities is felt to place them at high risk for further clinical deterioration. Furthermore, it is not anticipated that the patient will be medically stable for discharge from the hospital within 2 midnights of admission.   * I certify that at the point of admission it is my clinical judgment that  the patient will require inpatient hospital care spanning beyond 2 midnights from the point of admission due to high intensity of service, high risk for further deterioration and high frequency of surveillance required.*  Author: Orene Desanctis,  DO 04/10/2022 12:34 AM  For on call review [email or web address].

## 2022-04-09 NOTE — Progress Notes (Signed)
A consult was received from an ED physician for aztreonam per pharmacy dosing.  The patient's profile has been reviewed for ht/wt/allergies/indication/available labs.   A one time order has been placed for aztreonam 2g.  Further antibiotics/pharmacy consults should be ordered by admitting physician if indicated.                       Thank you, Peggyann Juba, PharmD, BCPS 04/09/2022  7:31 PM

## 2022-04-09 NOTE — ED Notes (Signed)
Patient transported to MRI 

## 2022-04-09 NOTE — ED Notes (Signed)
[facility] called for transport. 

## 2022-04-09 NOTE — Progress Notes (Signed)
Pharmacy Antibiotic Note  [name] is a 76 y.o. female admitted on 04/09/2022 with a CT temporal bone that showed mastoiditis.Marland Kitchen  Pharmacy has been consulted for vancomycin and cefepime dosing, 1st doses given in the ED.  Plan: Vancomycin 1gm IV q36h (AUC 473.7, Scr 0.9) aztreonam 1gm IV q8h Follow renal function, cultures and clinical course  Height: '5\' 2"'$  (157.5 cm) Weight: 49.9 kg (110 lb) IBW/kg (Calculated) : 50.1  Temp (24hrs), Avg:97.6 F (36.4 C), Min:97.6 F (36.4 C), Max:97.6 F (36.4 C)  Recent Labs  Lab 04/09/22 1910 04/09/22 1919  WBC 5.3  --   CREATININE 0.67 0.90    Estimated Creatinine Clearance: 41.9 mL/min (by C-G formula based on SCr of 0.9 mg/dL).    Allergies  Allergen Reactions   Ceftriaxone Anaphylaxis and Other (See Comments)    *ROCEPHIN*  "Blow up like a balloon"   Hydroxyzine Shortness Of Breath and Other (See Comments)    Pt states med make her light headed, get sob sxs   Doxycycline Nausea And Vomiting   Lexapro [Escitalopram] Other (See Comments)    Pt states med make her dizzy    Antimicrobials this admission: 7/14 vanc >> 7/14 aztreonam >>  Dose adjustments this admission:   Microbiology results:   Thank you for allowing pharmacy to be a part of this patient's care.  Dolly Rias RPh 04/09/2022, 10:38 PM

## 2022-04-09 NOTE — ED Triage Notes (Signed)
Pt to ED via EMS from home c/o dual ear infection on going for a month. Recently scene for the same, sent home with antivert rx- no relief. C/o vertigo.  Home o2 at 3L. HX: COPD, CHF,Last VS: 149/76, 100%4L, P 76

## 2022-04-09 NOTE — ED Provider Notes (Signed)
[place] DEPT Provider Note   CSN: [id number] Arrival date & time: 04/09/22  1807     History  Chief Complaint  Patient presents with   Otalgia   Dizziness    [name] is a 76 y.o. female history of COPD on 2 L nasal cannula, here presenting with worsening dizziness and otalgia.  Patient was diagnosed with otitis externa by PCP on 6/7 and was started on a course of ofloxacin Corticosporin drops.  Patient came to the ED on 6/25 and had a CT temporal bone that showed mastoiditis.  At that time the provider consulted Dr. Benjamine Mola from ENT and he reviewed the scan and recommend outpatient follow-up.  However patient never follow-up with Dr. Festus Holts.  She states that she did finish the course of ciprofloxacin and also ofloxacin drops but still does not feel well.  She states that she has persistent pain in the right ear.  She states that every time she gets up she feels very dizzy and ran straight into the wall.  She has been taking meclizine for dizziness. She denies any trouble speaking or focal weakness.  Patient states that the vertiginous symptoms are so bad that she had a hard time walking.  Denies any fevers.  The history is provided by the patient.       Home Medications Prior to Admission medications   Medication Sig Start Date End Date Taking? Authorizing Provider  ALPRAZolam (XANAX) 1 MG tablet TAKE 1 TABLET BY MOUTH 3 TIMES DAILY AS NEEDED FOR ANXIETY. Patient taking differently: Take 1 mg by mouth 3 (three) times daily as needed for anxiety. 03/11/22   [name], MD  ATROVENT HFA 17 MCG/ACT inhaler INHALE 2 PUFFS INTO THE LUNGS EVERY 4 HOURS AS NEEDED Patient taking differently: Inhale 2 puffs into the lungs every 4 (four) hours as needed for wheezing. 01/20/22   [name], MD  budesonide-formoterol [facility]) 160-4.5 MCG/ACT inhaler Inhale 2 puffs into the lungs 2 (two) times daily. Patient not taking: Reported on 03/21/2022 01/13/22    Hunsucker, Bonna Gains, MD  butalbital-acetaminophen-caffeine (FIORICET) [phone] MG tablet TAKE 1 TABLET BY MOUTH EVERY 4 HOURS AS NEEDED FOR HEADACHE. 04/08/22   [name], MD  cholecalciferol (VITAMIN D) 1000 units tablet Take 1,000 Units by mouth daily.    [provider]  diclofenac Sodium (VOLTAREN) 1 % GEL APPLY 2 GRAMS TO AFFECTED AREA 4 TIMES A DAY Patient taking differently: Apply 2 g topically 4 (four) times daily. 03/03/22   [name], MD  fluconazole (DIFLUCAN) 150 MG tablet Take 1 tablet (150 mg total) by mouth daily for 7 days. 04/02/22 04/09/22  [name], MD  Fluticasone-Umeclidin-Vilant (TRELEGY ELLIPTA) 100-62.5-25 MCG/ACT AEPB Inhale 1 puff into the lungs daily. Patient not taking: Reported on 03/21/2022 01/07/22   [name], MD  furosemide (LASIX) 40 MG tablet TAKE 1 TABLET BY MOUTH TWICE A DAY Patient taking differently: Take 40 mg by mouth 2 (two) times daily. 02/25/22   [name], MD  ipratropium-albuterol (DUONEB) 0.5-2.5 (3) MG/3ML SOLN INHALE 3 MLS INTO THE LUNGS EVERY 6 HOURS AS NEEDED (FOR FOR SHORTNESS OF BREATH [place] OR COUGH). 04/03/22   [name], MD  KLOR-CON M20 20 MEQ tablet TAKE 1 TABLET BY MOUTH EVERY DAY 11/20/21   [name], MD  lactulose, encephalopathy, (ENULOSE) 10 GM/15ML SOLN Take 45 mLs (30 g total) by mouth 3 (three) times daily as needed (constipation). 01/26/22  [name], MD  levocetirizine (XYZAL) 5 MG tablet TAKE 1 TABLET BY MOUTH EVERY DAY IN THE EVENING Patient taking differently: Take 5 mg by mouth every evening. 01/31/22   [name], MD  LINZESS 72 MCG capsule TAKE 1 CAPSULE BY MOUTH EVERY DAY BEFORE BREAKFAST Patient taking differently: Take 72 mcg by mouth daily before breakfast. 11/20/21   [name], MD  LIVALO 2 MG TABS TAKE 1 TABLET BY MOUTH EVERY DAY Patient taking differently: Take 2 mg by mouth daily at 12 noon. 02/17/22   Hilty, Nadean Corwin, MD  mometasone-formoterol (DULERA) 200-5  MCG/ACT AERO Inhale 2 puffs into the lungs 2 (two) times daily. 04/05/22   Hunsucker, Bonna Gains, MD  montelukast (SINGULAIR) 10 MG tablet TAKE 1 TABLET BY MOUTH EVERYDAY AT BEDTIME Patient taking differently: Take 10 mg by mouth at bedtime. 02/25/22   [name], MD  nortriptyline (PAMELOR) 25 MG capsule TAKE 2 CAPSULES BY MOUTH AT BEDTIME Patient taking differently: Take 50 mg by mouth at bedtime. 03/18/22   [name], MD  Omega 3 1200 MG CAPS Take 1,200 mg by mouth every morning.     [provider]  OXYGEN-HELIUM IN Inhale 3 L into the lungs See admin instructions. Uses when needed during the day, uses continuous throughout the night    [provider]  pantoprazole (PROTONIX) 40 MG tablet Take 1 tablet (40 mg total) by mouth daily. 01/13/22   [name], MD  polyvinyl alcohol (LIQUIFILM TEARS) 1.4 % ophthalmic solution Place 1 drop into both eyes as needed for dry eyes.    [provider]  VENTOLIN HFA 108 (90 Base) MCG/ACT inhaler INHALE 1-2 PUFFS BY MOUTH EVERY 6 HOURS AS NEEDED FOR WHEEZE OR SHORTNESS OF BREATH Patient taking differently: Inhale 1-2 puffs into the lungs every 6 (six) hours as needed for wheezing or shortness of breath. 01/13/22   [name], MD  vitamin B-12 (CYANOCOBALAMIN) 1000 MCG tablet Take 1 tablet (1,000 mcg total) by mouth daily. 03/09/22   [name], MD  vitamin C (ASCORBIC ACID) 500 MG tablet Take 500 mg by mouth daily.    [provider]      Allergies    Ceftriaxone, Hydroxyzine, Doxycycline, and Lexapro [escitalopram]    Review of Systems   Review of Systems  HENT:  Positive for ear pain.   Neurological:  Positive for dizziness.  All other systems reviewed and are negative.   Physical Exam Updated Vital Signs BP (!) 157/72   Pulse 76   Temp 97.6 F (36.4 C) (Oral)   Resp 15   Ht '5\' 2"'$  (1.575 m)   Wt 49.9 kg   SpO2 99%   BMI 20.12 kg/m  Physical Exam Vitals and nursing note reviewed.   Constitutional:      Comments: Chronically ill  HENT:     Head: Normocephalic.     Ears:     Comments: Right ear canal is very narrow.  There is diffuse erythema in the canal as well as in the outer ear.  I was able to see the TM but the TM is very opacified and I cannot see any landmarks.  Left TM and canal is normal    Nose: Nose normal.     Mouth/Throat:     Mouth: Mucous membranes are moist.  Eyes:     Extraocular Movements: Extraocular movements intact.     Pupils: Pupils are equal, round, and reactive to  light.  Cardiovascular:     Rate and Rhythm: Normal rate and regular rhythm.     Pulses: Normal pulses.  Pulmonary:     Comments: Tachypneic and diminished bilaterally Abdominal:     General: Abdomen is flat.     Palpations: Abdomen is soft.  Musculoskeletal:        General: Normal range of motion.     Cervical back: Normal range of motion and neck supple.  Skin:    General: Skin is warm.     Capillary Refill: Capillary refill takes less than 2 seconds.  Neurological:     General: No focal deficit present.     Mental Status: She is oriented to person, place, and time.     Comments: Cranial nerves II to XII intact.  Patient has normal strength and sensation bilateral arms and legs.  Unable to have her ambulate since she is very dizzy  Psychiatric:        Mood and Affect: Mood normal.        Behavior: Behavior normal.     ED Results / Procedures / Treatments   Labs (all labs ordered are listed, but only abnormal results are displayed) Labs Reviewed  CBC WITH DIFFERENTIAL/PLATELET - Abnormal; Notable for the following components:      Result Value   RBC 3.86 (*)    Hemoglobin 10.9 (*)    HCT 35.8 (*)    All other components within normal limits  COMPREHENSIVE METABOLIC PANEL - Abnormal; Notable for the following components:   Chloride 94 (*)    CO2 35 (*)    All other components within normal limits  I-STAT CHEM 8, ED - Abnormal; Notable for the following  components:   Sodium 133 (*)    Chloride 92 (*)    BUN 7 (*)    TCO2 40 (*)    Hemoglobin 11.6 (*)    HCT 34.0 (*)    All other components within normal limits    EKG None  Radiology DG Chest Port 1 View  Result Date: 04/09/2022 CLINICAL DATA:  Shortness of breath EXAM: PORTABLE CHEST 1 VIEW COMPARISON:  03/21/2022 FINDINGS: Transverse diameter of the heart is increased. There are no signs of alveolar pulmonary edema or new focal infiltrates. There is no pleural effusion or pneumothorax. There is possible skin fold overlying the lateral aspect of the right lower lung field. Surgical clips are seen in right chest wall. IMPRESSION: There are no signs of pulmonary edema or new focal infiltrates. Electronically Signed   By: Elmer Picker M.D.   On: 04/09/2022 20:06    Procedures Procedures    CRITICAL CARE Performed by: Wandra Arthurs   Total critical care time: 30 minutes  Critical care time was exclusive of separately billable procedures and treating other patients.  Critical care was necessary to treat or prevent imminent or life-threatening deterioration.  Critical care was time spent personally by me on the following activities: development of treatment plan with patient and/or surrogate as well as nursing, discussions with consultants, evaluation of patient's response to treatment, examination of patient, obtaining history from patient or surrogate, ordering and performing treatments and interventions, ordering and review of laboratory studies, ordering and review of radiographic studies, pulse oximetry and re-evaluation of patient's condition.   Medications Ordered in ED Medications  vancomycin (VANCOCIN) IVPB 1000 mg/200 mL premix (1,000 mg Intravenous New Bag/Given 04/09/22 2005)  LORazepam (ATIVAN) injection 1 mg (has no administration in time range)  ofloxacin (OCUFLOX)  0.3 % ophthalmic solution 5 drop (has no administration in time range)  aztreonam (AZACTAM) 2 g in  sodium chloride 0.9 % 100 mL IVPB (has no administration in time range)  sodium chloride (PF) 0.9 % injection (has no administration in time range)  meclizine (ANTIVERT) tablet 25 mg (25 mg Oral Given 04/09/22 2005)  albuterol (VENTOLIN HFA) 108 (90 Base) MCG/ACT inhaler 2 puff (2 puffs Inhalation Given 04/09/22 2006)  iohexol (OMNIPAQUE) 300 MG/ML solution 75 mL (75 mLs Intravenous Contrast Given 04/09/22 1950)    ED Course/ Medical Decision Making/ A&P                           Medical Decision Making JAELEEN INZUNZA is a 76 y.o. female here presenting with dizziness.  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  Concern for possible intracranial abscess from the mastoiditis versus destruction of the ossicles from the mastoiditis causing her vertigo.  I initially called Dr. Deeann Saint office. However the nurse call back and notify me that [place] and since patient never saw him in the office, she is not established with this practice and recommend that I talk to on-call ENT doctor.  I did talk to Dr. Janace Hoard from ENT.  He states that patient will likely need a year tube and recommend transfer to [facility] and get the MRI brain to rule out intracranial abscess.  9:37 PM I reviewed patient's labs and imaging studies.  CT showed right middle ear and mastoid disease compatible with acute mastoiditis.  Dr. Janace Hoard from ENT who request that hospitalist admit the patient and he will operate at [facility].  I ordered Vanco and aztreonam (patient has a cephalosporin allergy) as well as eardrops  Problems Addressed: Mastoiditis of right side: acute illness or injury  Amount and/or Complexity of Data Reviewed Labs: ordered. Decision-making details documented in ED Course. Radiology: ordered and independent interpretation performed. Decision-making details documented in ED Course.  Risk Prescription drug management.    Final Clinical Impression(s) / ED  Diagnoses Final diagnoses:  None    Rx / DC Orders ED Discharge Orders     None         Drenda Freeze, MD 04/09/22 2139

## 2022-04-10 ENCOUNTER — Other Ambulatory Visit: Payer: Self-pay

## 2022-04-10 ENCOUNTER — Encounter (HOSPITAL_COMMUNITY): Payer: Self-pay | Admitting: Family Medicine

## 2022-04-10 DIAGNOSIS — T161XXA Foreign body in right ear, initial encounter: Secondary | ICD-10-CM

## 2022-04-10 DIAGNOSIS — Z1889 Other specified retained foreign body fragments: Secondary | ICD-10-CM

## 2022-04-10 DIAGNOSIS — H7091 Unspecified mastoiditis, right ear: Secondary | ICD-10-CM

## 2022-04-10 DIAGNOSIS — H70001 Acute mastoiditis without complications, right ear: Secondary | ICD-10-CM | POA: Diagnosis not present

## 2022-04-10 DIAGNOSIS — R42 Dizziness and giddiness: Secondary | ICD-10-CM

## 2022-04-10 LAB — CBC
HCT: 33.1 % — ABNORMAL LOW (ref 36.0–46.0)
Hemoglobin: 10.4 g/dL — ABNORMAL LOW (ref 12.0–15.0)
MCH: 28.6 pg (ref 26.0–34.0)
MCHC: 31.4 g/dL (ref 30.0–36.0)
MCV: 90.9 fL (ref 80.0–100.0)
Platelets: 228 10*3/uL (ref 150–400)
RBC: 3.64 MIL/uL — ABNORMAL LOW (ref 3.87–5.11)
RDW: 15.2 % (ref 11.5–15.5)
WBC: 5.4 10*3/uL (ref 4.0–10.5)
nRBC: 0 % (ref 0.0–0.2)

## 2022-04-10 LAB — BASIC METABOLIC PANEL
Anion gap: 12 (ref 5–15)
BUN: 6 mg/dL — ABNORMAL LOW (ref 8–23)
CO2: 34 mmol/L — ABNORMAL HIGH (ref 22–32)
Calcium: 9.6 mg/dL (ref 8.9–10.3)
Chloride: 89 mmol/L — ABNORMAL LOW (ref 98–111)
Creatinine, Ser: 0.74 mg/dL (ref 0.44–1.00)
GFR, Estimated: >60 mL/min (ref >60)
Glucose, Bld: 88 mg/dL (ref 70–99)
Potassium: 4.1 mmol/L (ref 3.5–5.1)
Sodium: 135 mmol/L (ref 135–145)

## 2022-04-10 MED ORDER — MECLIZINE HCL 25 MG PO TABS
25.0000 mg | ORAL_TABLET | Freq: Three times a day (TID) | ORAL | Status: DC | PRN
  Administered 2022-04-10: 25 mg via ORAL
  Filled 2022-04-10: qty 1

## 2022-04-10 MED ORDER — VANCOMYCIN HCL 1250 MG/250ML IV SOLN
1250.0000 mg | INTRAVENOUS | Status: DC
  Administered 2022-04-11: 1250 mg via INTRAVENOUS
  Filled 2022-04-10: qty 250

## 2022-04-10 MED ORDER — ORAL CARE MOUTH RINSE: 15.0000 mL | OROMUCOSAL | Status: DC | PRN

## 2022-04-10 NOTE — Assessment & Plan Note (Signed)
Euvolemic on exam.  Continue home daily Lasix.

## 2022-04-10 NOTE — Assessment & Plan Note (Signed)
Most probable due to her acute mastoiditis, otitis media with middle ear effusion. -MRI of the brain is normal without any evidence of intracranial spread of infection

## 2022-04-10 NOTE — Assessment & Plan Note (Signed)
Stable.  Continue statin. 

## 2022-04-10 NOTE — Progress Notes (Signed)
Pt is a fall risk and is refusing the bed alarm.   [name]

## 2022-04-10 NOTE — Consult Note (Signed)
Reason for Consult:right ear infection Referring Physician: Dr Jeanette Caprice is an 76 y.o. female.  HPI: With a history of right ear pain.  She was seen latter part of June and apparently [name] was consulted and recommended outpatient therapy for drops and antibiotics for this ear disease.  She had a high-resolution temporal bone CT scan back then and had opacification of the right mastoid and occlusion of the medial ear canal.  She now has had a little bit of increased pain and perhaps some dizziness that was new.  This turns out is not new was actually going on many weeks before the initial evaluation back in June.  It is not changed.  She still is able to hear out of the ear and there is no acute changes in the hearing.  Her facial nerve is been intact with no weakness.  The pain is tolerable.  She underwent a another high-resolution CT scan and MRI yesterday.  The high-resolution showed erosion and coalescence in the mastoid and some erosion of the ear canal.  The MRI did not show any intracranial inflammation.  She has minimal to no drainage from the ear.  Past Medical History:  Diagnosis Date   Abnormal LFTs 08/02/2011   Acute liver failure 06/19/2013   Acute renal failure ([place]) 06/19/2013   Acute respiratory failure ([place]) 06/19/2013   Altered mental status 08/01/2011   Angina    Anxiety    Anxiety state, unspecified 12/03/2013   Aortic stenosis    mild   Arthritis    "hands" (02/03/2017)   Breast cancer, right breast ([place]) dx'd 2008   CAD (coronary artery disease) of artery bypass graft 11/06/2013   CAD (coronary artery disease), MI R/O 08/01/2011   PCI in 2006 (bare metal stent, unknown artery) - [place]    CAP (community acquired pneumonia) 09/11/2018   CHF,  acute diastolic, BNP 4k on admissio 08/01/2011   Chronic bronchitis (HCC)    Chronic respiratory failure ([place]) 02/02/2012   Compression fracture of L1 lumbar vertebra ([place]) 02/02/2012   COPD (chronic obstructive pulmonary disease)  ([place])    oxygen-dependent 4LPM [place]   Coronary artery disease 2006   2 stents w/previous MI   Depressed    Diastolic CHF ([place])    Dyslipidemia    Family history of adverse reaction to anesthesia    "daughter had c-section; missed twice w/epidural"   GERD (gastroesophageal reflux disease)    Heart murmur    History of blood transfusion    "w/my colon OR"   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   History of nuclear stress test 08/03/2011   attenuation at apex - no perfusion defects    HTN (hypertension) 11/06/2013   Hyperkalemia, on ACE prior to admission 11/11/2011   Hypertension    Hyponatremia 01/31/2012   Migraine    "qod to q couple months since I was 21" (02/03/2017)   Mild aortic stenosis 08/01/2011   AVA 1.69 cm2 (06/02/2011)    Moderate to severe pulmonary hypertension (HCC)    NSTEMI (non-ST elevated myocardial infarction) ([place]) 2006   NSVT (nonsustained ventricular tachycardia) ([place])    h/o   On home oxygen therapy    "3L just at night; have it available prn duringtheday" (09/11/2018)   Pneumonia    "alot of times" (02/03/2017)   SBO (small bowel obstruction) ([place]) 04/23/2020   PARTIAL     Past Surgical History:  Procedure Laterality Date   BREAST BIOPSY Right 2008  BREAST LUMPECTOMY Right 2008   malignant   CATARACT EXTRACTION W/ INTRAOCULAR LENS  IMPLANT, BILATERAL Bilateral ~ 2013   [place]; 1971; [place]  11/2007   COLOSTOMY CLOSURE  07/2008   CORONARY ANGIOPLASTY WITH STENT PLACEMENT  2006   "2 stents"   ERCP N/A 09/05/2015   Procedure: ENDOSCOPIC RETROGRADE CHOLANGIOPANCREATOGRAPHY (ERCP);  Surgeon: Ladene Artist, MD;  Location: [facility];  Service: Endoscopy;  Laterality: N/A;   PARTIAL COLECTOMY  2009   for obstruction: temporary ostomy, later reversed.    RIGHT HEART CATHETERIZATION N/A 09/05/2013   Procedure: RIGHT HEART CATH;  Surgeon: Jolaine Artist, MD;  Location: [facility] CATH  LAB;  Service: Cardiovascular;  Laterality: N/A;   TRANSTHORACIC ECHOCARDIOGRAM  11/12/2011   EF 56-21%, normal systolic function, grade 1 diastolic dysfunction; ventricular septal flattening (D-sign); mild AS; trace-mild MR; LA mildly dilated; RV mod dilated; RA mod dilated; severe pulm HTN; elevated CVP    Family History  Problem Relation Age of Onset   Alzheimer's disease Mother    Schizophrenia Sister    Breast cancer Sister        71s   Heart disease Sister    Diabetes Sister    Hyperlipidemia Brother    Cancer Neg Hx    Stroke Neg Hx    COPD Neg Hx    Depression Neg Hx    Drug abuse Neg Hx    Early death Neg Hx    Hypertension Neg Hx    Kidney disease Neg Hx     Social History:  reports that she has been smoking cigarettes. She has a 26.50 pack-year smoking history. She has never used smokeless tobacco. She reports current alcohol use of about 1.0 standard drink of alcohol per week. She reports that she does not use drugs.  Allergies:  Allergies  Allergen Reactions   Ceftriaxone Anaphylaxis and Other (See Comments)    *ROCEPHIN*  "Blow up like a balloon"   Hydroxyzine Shortness Of Breath and Other (See Comments)    Pt states med make her light headed, get sob sxs   Doxycycline Nausea And Vomiting   Lexapro [Escitalopram] Other (See Comments)    Pt states med make her dizzy    Medications: I have reviewed the patient's current medications.  Results for orders placed or performed during the hospital encounter of 04/09/22 (from the past 48 hour(s))  CBC with Differential     Status: Abnormal   Collection Time: 04/09/22  7:10 PM  Result Value Ref Range   WBC 5.3 4.0 - 10.5 K/uL   RBC 3.86 (L) 3.87 - 5.11 MIL/uL   Hemoglobin 10.9 (L) 12.0 - 15.0 g/dL   HCT 35.8 (L) 36.0 - 46.0 %   MCV 92.7 80.0 - 100.0 fL   MCH 28.2 26.0 - 34.0 pg   MCHC 30.4 30.0 - 36.0 g/dL   RDW 15.5 11.5 - 15.5 %   Platelets 246 150 - 400 K/uL   nRBC 0.0 0.0 - 0.2 %   Neutrophils Relative %  69 %   Neutro Abs 3.6 1.7 - 7.7 K/uL   Lymphocytes Relative 23 %   Lymphs Abs 1.2 0.7 - 4.0 K/uL   Monocytes Relative 7 %   Monocytes Absolute 0.4 0.1 - 1.0 K/uL   Eosinophils Relative 0 %   Eosinophils Absolute 0.0 0.0 - 0.5 K/uL   Basophils Relative 1 %  Basophils Absolute 0.1 0.0 - 0.1 K/uL   Immature Granulocytes 0 %   Abs Immature Granulocytes 0.01 0.00 - 0.07 K/uL    Comment: Performed at [facility], [place] [address], [place], [place] [address]  Comprehensive metabolic panel     Status: Abnormal   Collection Time: 04/09/22  7:10 PM  Result Value Ref Range   Sodium 137 135 - 145 mmol/L   Potassium 4.4 3.5 - 5.1 mmol/L   Chloride 94 (L) 98 - 111 mmol/L   CO2 35 (H) 22 - 32 mmol/L   Glucose, Bld 88 70 - 99 mg/dL    Comment: Glucose reference range applies only to samples taken after fasting for at least 8 hours.   BUN 9 8 - 23 mg/dL   Creatinine, Ser 0.67 0.44 - 1.00 mg/dL   Calcium 9.4 8.9 - 10.3 mg/dL   Total Protein 6.8 6.5 - 8.1 g/dL   Albumin 3.5 3.5 - 5.0 g/dL   AST 21 15 - 41 U/L   ALT 13 0 - 44 U/L   Alkaline Phosphatase 45 38 - 126 U/L   Total Bilirubin 0.4 0.3 - 1.2 mg/dL   GFR, Estimated >60 >60 mL/min    Comment: (NOTE) Calculated using the CKD-EPI Creatinine Equation (2021)    Anion gap 8 5 - 15    Comment: Performed at [facility], [place] [address], [place], [place] [address]  I-stat chem 8, ED (not at [facility] or [facility])     Status: Abnormal   Collection Time: 04/09/22  7:19 PM  Result Value Ref Range   Sodium 133 (L) 135 - 145 mmol/L   Potassium 4.4 3.5 - 5.1 mmol/L   Chloride 92 (L) 98 - 111 mmol/L   BUN 7 (L) 8 - 23 mg/dL   Creatinine, Ser 0.90 0.44 - 1.00 mg/dL   Glucose, Bld 82 70 - 99 mg/dL    Comment: Glucose reference range applies only to samples taken after fasting for at least 8 hours.   Calcium, Ion 1.15 1.15 - 1.40 mmol/L   TCO2 40 (H) 22 - 32 mmol/L   Hemoglobin 11.6 (L) 12.0 - 15.0 g/dL   HCT 34.0 (L)  36.0 - 35.3 %  Basic metabolic panel     Status: Abnormal   Collection Time: 04/10/22  2:12 AM  Result Value Ref Range   Sodium 135 135 - 145 mmol/L   Potassium 4.1 3.5 - 5.1 mmol/L   Chloride 89 (L) 98 - 111 mmol/L   CO2 34 (H) 22 - 32 mmol/L   Glucose, Bld 88 70 - 99 mg/dL    Comment: Glucose reference range applies only to samples taken after fasting for at least 8 hours.   BUN 6 (L) 8 - 23 mg/dL   Creatinine, Ser 0.74 0.44 - 1.00 mg/dL   Calcium 9.6 8.9 - 10.3 mg/dL   GFR, Estimated >60 >60 mL/min    Comment: (NOTE) Calculated using the CKD-EPI Creatinine Equation (2021)    Anion gap 12 5 - 15    Comment: Performed at [place] [address], [place] [address]  CBC     Status: Abnormal   Collection Time: 04/10/22  5:53 AM  Result Value Ref Range   WBC 5.4 4.0 - 10.5 K/uL   RBC 3.64 (L) 3.87 - 5.11 MIL/uL   Hemoglobin 10.4 (L) 12.0 - 15.0 g/dL   HCT 33.1 (L) 36.0 - 46.0 %   MCV 90.9  80.0 - 100.0 fL   MCH 28.6 26.0 - 34.0 pg   MCHC 31.4 30.0 - 36.0 g/dL   RDW 15.2 11.5 - 15.5 %   Platelets 228 150 - 400 K/uL   nRBC 0.0 0.0 - 0.2 %    Comment: Performed at [facility], [place] [address], [place], [place] [address]    MR BRAIN W WO CONTRAST  Result Date: 04/09/2022 CLINICAL DATA:  Initial evaluation for dizziness. EXAM: MRI HEAD WITHOUT AND WITH CONTRAST TECHNIQUE: Multiplanar, multiecho pulse sequences of the brain and surrounding structures were obtained without and with intravenous contrast. CONTRAST:  89m GADAVIST GADOBUTROL 1 MMOL/ML IV SOLN COMPARISON:  Prior CT from earlier the same day. FINDINGS: Brain: Cerebral volume within normal limits. Moderate chronic microvascular ischemic disease noted involving the periventricular deep white matter both cerebral hemispheres as well as the pons. No evidence for acute or subacute ischemia. Gray-white matter differentiation maintained. No areas of chronic cortical infarction. No acute or chronic intracranial  blood products. No mass lesion, midline shift or mass effect. No hydrocephalus or extra-axial fluid collection. Pituitary gland suprasellar region normal. No abnormal enhancement. No evidence for intracranial spread of infection. Vascular: Major intracranial vascular flow voids are maintained. Skull and upper cervical spine: Craniocervical junction normal. Bone marrow signal intensity within normal limits. No scalp soft tissue abnormality. Sinuses/Orbits: Prior bilateral ocular lens replacement. Mild scattered mucosal thickening noted about the ethmoidal air cells. Paranasal sinuses are otherwise clear. Other: Right mastoid and middle ear effusion with associated enhancement. Findings better characterized on prior temporal bone CT. IMPRESSION: 1. No acute intracranial abnormality. 2. Moderate chronic microvascular ischemic disease. 3. Right mastoid and middle ear effusion, better characterized on prior temporal bone CT. Electronically Signed   By: BJeannine BogaM.D.   On: 04/09/2022 23:05   CT Temporal Bones W Contrast  Result Date: 04/09/2022 CLINICAL DATA:  Right-sided mastoiditis. Your infection. EXAM: CT TEMPORAL BONES WITH CONTRAST TECHNIQUE: Axial and coronal plane CT imaging of the petrous temporal bones was performed with thin-collimation image reconstruction following intravenous contrast administration. Multiplanar CT image reconstructions were also generated. RADIATION DOSE REDUCTION: This exam was performed according to the departmental dose-optimization program which includes automated exposure control, adjustment of the mA and/or kV according to patient size and/or use of iterative reconstruction technique. CONTRAST:  765mOMNIPAQUE IOHEXOL 300 MG/ML  SOLN COMPARISON:  None Available. FINDINGS: RIGHT TEMPORAL BONE External auditory canal: Right external auditory canal is opacified medially. Erosive changes are present along the posterior and superior aspect of the canal. Middle ear cavity:  The middle ear cavity is opacified. Ossicles are within normal limits. Fluid or soft tissue extends into the epitympanum. There is some blunting of the scutum. Inner ear structures: The cochlea, vestibule and semicircular canals are normal. The vestibular aqueduct is not enlarged. Internal auditory and facial nerve canals:  Normal Mastoid air cells: Mastoid air cells are diffusely opacified there is some coalescence of cells. Cortex is breech posteriorly adjacent to the sigmoid sinus. LEFT TEMPORAL BONE External auditory canal: Cerumen is present in the medial canal. The external auditory canal is otherwise within normal limits. Middle ear cavity: Normally aerated. The scutum and ossicles are normal. The tegmen tympani is intact. Inner ear structures: The cochlea, vestibule and semicircular canals are normal. The vestibular aqueduct is not enlarged. Internal auditory and facial nerve canals:  Normal. Mastoid air cells: Normally aerated. No osseous erosion. Vascular: Normal appearance of the carotid canals, jugular bulbs and sigmoid plates.  The dural sinuses are patent. Limited intracranial:  Within normal limits. Visible orbits/paranasal sinuses: Bilateral lens replacements are noted. Globes and orbits are otherwise unremarkable. Paranasal sinuses are clear. Soft tissues: Soft tissue enhancement is noted along the lateral inferior aspect of the right mastoid. No abscess is present. IMPRESSION: 1. Right middle ear and mastoid disease with some coalescence of cells and soft tissue along the lateral inferior aspect of the mastoid compatible with acute mastoiditis. 2. Erosive changes along the posterior and superior aspect of the right external auditory canal consistent with aggressive otitis externa. 3. Erosive changes of the right scutum suggesting a chronic process. 4. The posterior right mastoid air cells are eroded without enhancement along the dura 5. Normal appearance of the left temporal bone. Electronically  Signed   By: San Morelle M.D.   On: 04/09/2022 20:21   DG Chest Port 1 View  Result Date: 04/09/2022 CLINICAL DATA:  Shortness of breath EXAM: PORTABLE CHEST 1 VIEW COMPARISON:  03/21/2022 FINDINGS: Transverse diameter of the heart is increased. There are no signs of alveolar pulmonary edema or new focal infiltrates. There is no pleural effusion or pneumothorax. There is possible skin fold overlying the lateral aspect of the right lower lung field. Surgical clips are seen in right chest wall. IMPRESSION: There are no signs of pulmonary edema or new focal infiltrates. Electronically Signed   By: Elmer Picker M.D.   On: 04/09/2022 20:06    ROS Blood pressure 133/62, pulse 72, temperature 97.7 F (36.5 C), resp. rate 18, height '5\' 2"'$  (1.575 m), weight 42.8 kg, SpO2 97 %. Physical Exam Constitutional:      Appearance: Normal appearance.  HENT:     Ears:     Comments: I could not find an available otoscope to examine the ear.  The facial nerve is intact.  Grossly her hearing is intact by finger click.    Nose: Nose normal.     Mouth/Throat:     Mouth: Mucous membranes are moist.  Eyes:     Extraocular Movements: Extraocular movements intact.     Pupils: Pupils are equal, round, and reactive to light.  Neurological:     Mental Status: She is alert.       Assessment/Plan: Right otomastoid disease-there is erosion on the CT scan and a persistence of this problem.  At this point she needs to have a tympanostomy tube placed and cleaning of the ear of what ever debris is overlying the medial ear canal.  She has a significant issue with medical problems including significant pulmonary issues.  I think given this the best approach would be a local injection in the operating room to have the microscope equipment and cleaning the ear the best I can with this method and placed the tube.  We discussed this procedure including risk, benefits, and options.  All her questions were answered  and consent was obtained.  This will be scheduled for tomorrow morning so she will be n.p.o. after midnight.  Melissa Montane 04/10/2022, 9:27 AM

## 2022-04-10 NOTE — Progress Notes (Signed)
New Admission Note:  Arrival Method: [facility] Mental Orientation: Alert and oriented x 4 Telemetry: Box 16 Assessment: Completed Skin: Warm and very dry, flaky IV: NSL Pain: Denies Tubes: N/A Safety Measures: Safety Fall Prevention Plan initiated.  Admission: Completed 5 M  Orientation: Patient has been orientated to the room, unit and the staff. Welcome booklet given.  Family: N/A  Orders have been reviewed and implemented. Will continue to monitor the patient. Call light has been placed within reach and bed alarm has been activated.   Sima Matas BSN, RN  Phone Number: [phone]

## 2022-04-10 NOTE — ED Notes (Signed)
[facility] has arrived to transport pt to [facility]. 

## 2022-04-10 NOTE — ED Notes (Signed)
Report to Jasa, RN with [facility], they are in route to transport pt

## 2022-04-10 NOTE — Plan of Care (Signed)
  Problem: Education: Goal: Knowledge of General Education information will improve Description Including pain rating scale, medication(s)/side effects and non-pharmacologic comfort measures Outcome: Progressing   

## 2022-04-10 NOTE — Anesthesia Preprocedure Evaluation (Addendum)
Anesthesia Evaluation  Patient identified by MRN, date of birth, ID band Patient awake    Reviewed: Allergy & Precautions, NPO status , Patient's Chart, lab work & pertinent test results  History of Anesthesia Complications Negative for: history of anesthetic complications  Airway Mallampati: II  TM Distance: >3 FB Neck ROM: Full    Dental  (+) Chipped, Poor Dentition, Missing, Dental Advisory Given   Pulmonary COPD,  COPD inhaler and oxygen dependent, Current Smoker and Patient abstained from smoking.,    breath sounds clear to auscultation       Cardiovascular hypertension, Pt. on medications (-) angina+ CAD, + Past MI and + Cardiac Stents  + dysrhythmias Ventricular Tachycardia + Valvular Problems/Murmurs AS  Rhythm:Regular Rate:Normal + Systolic murmurs 0/8657 ECHO: EF 60-65%, normal LVF, Grade 1 DD, normal RVF, mod calcification and thickening of AV, mod-severe AS with mean grad 26 mmHg, peak grad 42 mmHg, AVA 0.82cm2   Neuro/Psych  Headaches, Anxiety Depression    GI/Hepatic Neg liver ROS, GERD  Medicated and Controlled,  Endo/Other  negative endocrine ROS  Renal/GU Renal InsufficiencyRenal disease     Musculoskeletal  (+) Arthritis ,   Abdominal   Peds  Hematology  (+) anemia ,   Anesthesia Other Findings   Reproductive/Obstetrics                            Anesthesia Physical Anesthesia Plan  ASA: 4  Anesthesia Plan: MAC   Post-op Pain Management: Ofirmev IV (intra-op)*   Induction:   PONV Risk Score and Plan: 2 and Ondansetron and Dexamethasone  Airway Management Planned: Natural Airway and Simple Face Mask  Additional Equipment: None  Intra-op Plan:   Post-operative Plan:   Informed Consent: I have reviewed the patients History and Physical, chart, labs and discussed the procedure including the risks, benefits and alternatives for the proposed anesthesia with the  patient or authorized representative who has indicated his/her understanding and acceptance.     Dental advisory given  Plan Discussed with: CRNA and Surgeon  Anesthesia Plan Comments:        Anesthesia Quick Evaluation

## 2022-04-10 NOTE — H&P (View-Only) (Signed)
Reason for Consult:right ear infection Referring Physician: [name] is an 76 y.o. female.  HPI: With a history of right ear pain.  She was seen latter part of June and apparently Dr. Lorelee Hopkins was consulted and recommended outpatient therapy for drops and antibiotics for this ear disease.  She had a high-resolution temporal bone CT scan back then and had opacification of the right mastoid and occlusion of the medial ear canal.  She now has had a little bit of increased pain and perhaps some dizziness that was new.  This turns out is not new was actually going on many weeks before the initial evaluation back in June.  It is not changed.  She still is able to hear out of the ear and there is no acute changes in the hearing.  Her facial nerve is been intact with no weakness.  The pain is tolerable.  She underwent a another high-resolution CT scan and MRI yesterday.  The high-resolution showed erosion and coalescence in the mastoid and some erosion of the ear canal.  The MRI did not show any intracranial inflammation.  She has minimal to no drainage from the ear.  Past Medical History:  Diagnosis Date   Abnormal LFTs 08/02/2011   Acute liver failure 06/19/2013   Acute renal failure ([place]) 06/19/2013   Acute respiratory failure ([place]) 06/19/2013   Altered mental status 08/01/2011   Angina    Anxiety    Anxiety state, unspecified 12/03/2013   Aortic stenosis    mild   Arthritis    "hands" (02/03/2017)   Breast cancer, right breast ([place]) dx'd 2008   CAD (coronary artery disease) of artery bypass graft 11/06/2013   CAD (coronary artery disease), MI R/O 08/01/2011   PCI in 2006 (bare metal stent, unknown artery) - [place]    CAP (community acquired pneumonia) 09/11/2018   CHF,  acute diastolic, BNP 4k on admissio 08/01/2011   Chronic bronchitis (HCC)    Chronic respiratory failure ([place]) 02/02/2012   Compression fracture of L1 lumbar vertebra ([place]) 02/02/2012   COPD (chronic obstructive pulmonary disease)  ([place])    oxygen-dependent 4LPM [place]   Coronary artery disease 2006   2 stents w/previous MI   Depressed    Diastolic CHF ([place])    Dyslipidemia    Family history of adverse reaction to anesthesia    "daughter had c-section; missed twice w/epidural"   GERD (gastroesophageal reflux disease)    Heart murmur    History of blood transfusion    "w/my colon OR"   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   History of nuclear stress test 08/03/2011   attenuation at apex - no perfusion defects    HTN (hypertension) 11/06/2013   Hyperkalemia, on ACE prior to admission 11/11/2011   Hypertension    Hyponatremia 01/31/2012   Migraine    "qod to q couple months since I was 21" (02/03/2017)   Mild aortic stenosis 08/01/2011   AVA 1.69 cm2 (06/02/2011)    Moderate to severe pulmonary hypertension (HCC)    NSTEMI (non-ST elevated myocardial infarction) ([place]) 2006   NSVT (nonsustained ventricular tachycardia) ([place])    h/o   On home oxygen therapy    "3L just at night; have it available prn duringtheday" (09/11/2018)   Pneumonia    "alot of times" (02/03/2017)   SBO (small bowel obstruction) ([place]) 04/23/2020   PARTIAL     Past Surgical History:  Procedure Laterality Date   BREAST BIOPSY Right 2008  BREAST LUMPECTOMY Right 2008   malignant   CATARACT EXTRACTION W/ INTRAOCULAR LENS  IMPLANT, BILATERAL Bilateral ~ 2013   [place]; 1971; [place]  11/2007   COLOSTOMY CLOSURE  07/2008   CORONARY ANGIOPLASTY WITH STENT PLACEMENT  2006   "2 stents"   ERCP N/A 09/05/2015   Procedure: ENDOSCOPIC RETROGRADE CHOLANGIOPANCREATOGRAPHY (ERCP);  Surgeon: Ladene Artist, MD;  Location: [facility];  Service: Endoscopy;  Laterality: N/A;   PARTIAL COLECTOMY  2009   for obstruction: temporary ostomy, later reversed.    RIGHT HEART CATHETERIZATION N/A 09/05/2013   Procedure: RIGHT HEART CATH;  Surgeon: Jolaine Artist, MD;  Location: [facility] CATH  LAB;  Service: Cardiovascular;  Laterality: N/A;   TRANSTHORACIC ECHOCARDIOGRAM  11/12/2011   EF 39-76%, normal systolic function, grade 1 diastolic dysfunction; ventricular septal flattening (D-sign); mild AS; trace-mild MR; LA mildly dilated; RV mod dilated; RA mod dilated; severe pulm HTN; elevated CVP    Family History  Problem Relation Age of Onset   Alzheimer's disease Mother    Schizophrenia Sister    Breast cancer Sister        60s   Heart disease Sister    Diabetes Sister    Hyperlipidemia Brother    Cancer Neg Hx    Stroke Neg Hx    COPD Neg Hx    Depression Neg Hx    Drug abuse Neg Hx    Early death Neg Hx    Hypertension Neg Hx    Kidney disease Neg Hx     Social History:  reports that she has been smoking cigarettes. She has a 26.50 pack-year smoking history. She has never used smokeless tobacco. She reports current alcohol use of about 1.0 standard drink of alcohol per week. She reports that she does not use drugs.  Allergies:  Allergies  Allergen Reactions   Ceftriaxone Anaphylaxis and Other (See Comments)    *ROCEPHIN*  "Blow up like a balloon"   Hydroxyzine Shortness Of Breath and Other (See Comments)    Pt states med make her light headed, get sob sxs   Doxycycline Nausea And Vomiting   Lexapro [Escitalopram] Other (See Comments)    Pt states med make her dizzy    Medications: I have reviewed the patient's current medications.  Results for orders placed or performed during the hospital encounter of 04/09/22 (from the past 48 hour(s))  CBC with Differential     Status: Abnormal   Collection Time: 04/09/22  7:10 PM  Result Value Ref Range   WBC 5.3 4.0 - 10.5 K/uL   RBC 3.86 (L) 3.87 - 5.11 MIL/uL   Hemoglobin 10.9 (L) 12.0 - 15.0 g/dL   HCT 35.8 (L) 36.0 - 46.0 %   MCV 92.7 80.0 - 100.0 fL   MCH 28.2 26.0 - 34.0 pg   MCHC 30.4 30.0 - 36.0 g/dL   RDW 15.5 11.5 - 15.5 %   Platelets 246 150 - 400 K/uL   nRBC 0.0 0.0 - 0.2 %   Neutrophils Relative %  69 %   Neutro Abs 3.6 1.7 - 7.7 K/uL   Lymphocytes Relative 23 %   Lymphs Abs 1.2 0.7 - 4.0 K/uL   Monocytes Relative 7 %   Monocytes Absolute 0.4 0.1 - 1.0 K/uL   Eosinophils Relative 0 %   Eosinophils Absolute 0.0 0.0 - 0.5 K/uL   Basophils Relative 1 %  Basophils Absolute 0.1 0.0 - 0.1 K/uL   Immature Granulocytes 0 %   Abs Immature Granulocytes 0.01 0.00 - 0.07 K/uL    Comment: Performed at [facility], [place] [address], [place], [place] [address]  Comprehensive metabolic panel     Status: Abnormal   Collection Time: 04/09/22  7:10 PM  Result Value Ref Range   Sodium 137 135 - 145 mmol/L   Potassium 4.4 3.5 - 5.1 mmol/L   Chloride 94 (L) 98 - 111 mmol/L   CO2 35 (H) 22 - 32 mmol/L   Glucose, Bld 88 70 - 99 mg/dL    Comment: Glucose reference range applies only to samples taken after fasting for at least 8 hours.   BUN 9 8 - 23 mg/dL   Creatinine, Ser 0.67 0.44 - 1.00 mg/dL   Calcium 9.4 8.9 - 10.3 mg/dL   Total Protein 6.8 6.5 - 8.1 g/dL   Albumin 3.5 3.5 - 5.0 g/dL   AST 21 15 - 41 U/L   ALT 13 0 - 44 U/L   Alkaline Phosphatase 45 38 - 126 U/L   Total Bilirubin 0.4 0.3 - 1.2 mg/dL   GFR, Estimated >60 >60 mL/min    Comment: (NOTE) Calculated using the CKD-EPI Creatinine Equation (2021)    Anion gap 8 5 - 15    Comment: Performed at [facility], [place] [address], [place], [place] [address]  I-stat chem 8, ED (not at [facility] or [facility])     Status: Abnormal   Collection Time: 04/09/22  7:19 PM  Result Value Ref Range   Sodium 133 (L) 135 - 145 mmol/L   Potassium 4.4 3.5 - 5.1 mmol/L   Chloride 92 (L) 98 - 111 mmol/L   BUN 7 (L) 8 - 23 mg/dL   Creatinine, Ser 0.90 0.44 - 1.00 mg/dL   Glucose, Bld 82 70 - 99 mg/dL    Comment: Glucose reference range applies only to samples taken after fasting for at least 8 hours.   Calcium, Ion 1.15 1.15 - 1.40 mmol/L   TCO2 40 (H) 22 - 32 mmol/L   Hemoglobin 11.6 (L) 12.0 - 15.0 g/dL   HCT 34.0 (L)  36.0 - 06.2 %  Basic metabolic panel     Status: Abnormal   Collection Time: 04/10/22  2:12 AM  Result Value Ref Range   Sodium 135 135 - 145 mmol/L   Potassium 4.1 3.5 - 5.1 mmol/L   Chloride 89 (L) 98 - 111 mmol/L   CO2 34 (H) 22 - 32 mmol/L   Glucose, Bld 88 70 - 99 mg/dL    Comment: Glucose reference range applies only to samples taken after fasting for at least 8 hours.   BUN 6 (L) 8 - 23 mg/dL   Creatinine, Ser 0.74 0.44 - 1.00 mg/dL   Calcium 9.6 8.9 - 10.3 mg/dL   GFR, Estimated >60 >60 mL/min    Comment: (NOTE) Calculated using the CKD-EPI Creatinine Equation (2021)    Anion gap 12 5 - 15    Comment: Performed at [place] [address] [place] [address]  CBC     Status: Abnormal   Collection Time: 04/10/22  5:53 AM  Result Value Ref Range   WBC 5.4 4.0 - 10.5 K/uL   RBC 3.64 (L) 3.87 - 5.11 MIL/uL   Hemoglobin 10.4 (L) 12.0 - 15.0 g/dL   HCT 33.1 (L) 36.0 - 46.0 %   MCV 90.9  80.0 - 100.0 fL   MCH 28.6 26.0 - 34.0 pg   MCHC 31.4 30.0 - 36.0 g/dL   RDW 15.2 11.5 - 15.5 %   Platelets 228 150 - 400 K/uL   nRBC 0.0 0.0 - 0.2 %    Comment: Performed at [facility], [place] [address] [place], [place] [address]    MR BRAIN W WO CONTRAST  Result Date: 04/09/2022 CLINICAL DATA:  Initial evaluation for dizziness. EXAM: MRI HEAD WITHOUT AND WITH CONTRAST TECHNIQUE: Multiplanar, multiecho pulse sequences of the brain and surrounding structures were obtained without and with intravenous contrast. CONTRAST:  85m GADAVIST GADOBUTROL 1 MMOL/ML IV SOLN COMPARISON:  Prior CT from earlier the same day. FINDINGS: Brain: Cerebral volume within normal limits. Moderate chronic microvascular ischemic disease noted involving the periventricular deep white matter both cerebral hemispheres as well as the pons. No evidence for acute or subacute ischemia. Gray-white matter differentiation maintained. No areas of chronic cortical infarction. No acute or chronic intracranial  blood products. No mass lesion, midline shift or mass effect. No hydrocephalus or extra-axial fluid collection. Pituitary gland suprasellar region normal. No abnormal enhancement. No evidence for intracranial spread of infection. Vascular: Major intracranial vascular flow voids are maintained. Skull and upper cervical spine: Craniocervical junction normal. Bone marrow signal intensity within normal limits. No scalp soft tissue abnormality. Sinuses/Orbits: Prior bilateral ocular lens replacement. Mild scattered mucosal thickening noted about the ethmoidal air cells. Paranasal sinuses are otherwise clear. Other: Right mastoid and middle ear effusion with associated enhancement. Findings better characterized on prior temporal bone CT. IMPRESSION: 1. No acute intracranial abnormality. 2. Moderate chronic microvascular ischemic disease. 3. Right mastoid and middle ear effusion, better characterized on prior temporal bone CT. Electronically Signed   By: BJeannine BogaM.D.   On: 04/09/2022 23:05   CT Temporal Bones W Contrast  Result Date: 04/09/2022 CLINICAL DATA:  Right-sided mastoiditis. Your infection. EXAM: CT TEMPORAL BONES WITH CONTRAST TECHNIQUE: Axial and coronal plane CT imaging of the petrous temporal bones was performed with thin-collimation image reconstruction following intravenous contrast administration. Multiplanar CT image reconstructions were also generated. RADIATION DOSE REDUCTION: This exam was performed according to the departmental dose-optimization program which includes automated exposure control, adjustment of the mA and/or kV according to patient size and/or use of iterative reconstruction technique. CONTRAST:  764mOMNIPAQUE IOHEXOL 300 MG/ML  SOLN COMPARISON:  None Available. FINDINGS: RIGHT TEMPORAL BONE External auditory canal: Right external auditory canal is opacified medially. Erosive changes are present along the posterior and superior aspect of the canal. Middle ear cavity:  The middle ear cavity is opacified. Ossicles are within normal limits. Fluid or soft tissue extends into the epitympanum. There is some blunting of the scutum. Inner ear structures: The cochlea, vestibule and semicircular canals are normal. The vestibular aqueduct is not enlarged. Internal auditory and facial nerve canals:  Normal Mastoid air cells: Mastoid air cells are diffusely opacified there is some coalescence of cells. Cortex is breech posteriorly adjacent to the sigmoid sinus. LEFT TEMPORAL BONE External auditory canal: Cerumen is present in the medial canal. The external auditory canal is otherwise within normal limits. Middle ear cavity: Normally aerated. The scutum and ossicles are normal. The tegmen tympani is intact. Inner ear structures: The cochlea, vestibule and semicircular canals are normal. The vestibular aqueduct is not enlarged. Internal auditory and facial nerve canals:  Normal. Mastoid air cells: Normally aerated. No osseous erosion. Vascular: Normal appearance of the carotid canals, jugular bulbs and sigmoid plates.  The dural sinuses are patent. Limited intracranial:  Within normal limits. Visible orbits/paranasal sinuses: Bilateral lens replacements are noted. Globes and orbits are otherwise unremarkable. Paranasal sinuses are clear. Soft tissues: Soft tissue enhancement is noted along the lateral inferior aspect of the right mastoid. No abscess is present. IMPRESSION: 1. Right middle ear and mastoid disease with some coalescence of cells and soft tissue along the lateral inferior aspect of the mastoid compatible with acute mastoiditis. 2. Erosive changes along the posterior and superior aspect of the right external auditory canal consistent with aggressive otitis externa. 3. Erosive changes of the right scutum suggesting a chronic process. 4. The posterior right mastoid air cells are eroded without enhancement along the dura 5. Normal appearance of the left temporal bone. Electronically  Signed   By: San Morelle M.D.   On: 04/09/2022 20:21   DG Chest Port 1 View  Result Date: 04/09/2022 CLINICAL DATA:  Shortness of breath EXAM: PORTABLE CHEST 1 VIEW COMPARISON:  03/21/2022 FINDINGS: Transverse diameter of the heart is increased. There are no signs of alveolar pulmonary edema or new focal infiltrates. There is no pleural effusion or pneumothorax. There is possible skin fold overlying the lateral aspect of the right lower lung field. Surgical clips are seen in right chest wall. IMPRESSION: There are no signs of pulmonary edema or new focal infiltrates. Electronically Signed   By: Elmer Picker M.D.   On: 04/09/2022 20:06    ROS Blood pressure 133/62, pulse 72, temperature 97.7 F (36.5 C), resp. rate 18, height '5\' 2"'$  (1.575 m), weight 42.8 kg, SpO2 97 %. Physical Exam Constitutional:      Appearance: Normal appearance.  HENT:     Ears:     Comments: I could not find an available otoscope to examine the ear.  The facial nerve is intact.  Grossly her hearing is intact by finger click.    Nose: Nose normal.     Mouth/Throat:     Mouth: Mucous membranes are moist.  Eyes:     Extraocular Movements: Extraocular movements intact.     Pupils: Pupils are equal, round, and reactive to light.  Neurological:     Mental Status: She is alert.       Assessment/Plan: Right otomastoid disease-there is erosion on the CT scan and a persistence of this problem.  At this point she needs to have a tympanostomy tube placed and cleaning of the ear of what ever debris is overlying the medial ear canal.  She has a significant issue with medical problems including significant pulmonary issues.  I think given this the best approach would be a local injection in the operating room to have the microscope equipment and cleaning the ear the best I can with this method and placed the tube.  We discussed this procedure including risk, benefits, and options.  All her questions were answered  and consent was obtained.  This will be scheduled for tomorrow morning so she will be n.p.o. after midnight.  [name] 04/10/2022, 9:27 AM

## 2022-04-10 NOTE — Assessment & Plan Note (Addendum)
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 Case was discussed with Dr. Janace Hoard by EDP. Keep n.p.o. past midnight.

## 2022-04-10 NOTE — Assessment & Plan Note (Signed)
In the setting of COPD on 3 L.  Remained stable and not in acute exacerbation.  Continue home bronchodilator.

## 2022-04-10 NOTE — ED Notes (Signed)
Pt resting and watching TV, NAD noted, even RR and unlabored, call bell within reach for assistance, side rails up x2 for safety, pt voiced no concerns or questions at this time, care on going as pt awaits transport to [facility]

## 2022-04-10 NOTE — Progress Notes (Addendum)
PROGRESS NOTE    [name]  JGG:[id number] DOB: 04/13/1946 DOA: 04/09/2022 PCP: [name], MD     Brief Narrative:  [name] is a 76 y.o. 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 She was recently diagnosed with chronic otitis externa and started on ofloxacin and Cortisporin drops by PCP on 03/03/2022. She was evaluated at ED on 03/21/2022 with CT of the temporal bone performed concerning for otomastoiditis.Dr. Melene Plan of ENT was consulted and felt that pt did not have mastoiditis and recommended initiation of Ciprodex drops and Augmentin.  Given her antibiotic allergy, she was given ciprofloxacin and ofloxacin otic solution.  She was advised to follow-up with ENT which she has not done. She continues to have pain to right ear with slight hearing loss. Has severe vertigo and would fall forward each time she tries to get up. CT temporal bone now showing acute mastoiditis and aggressive otitis externa of the right ear. ENT consulted.   New events last 24 hours / Subjective: Patient continues to complain of right ear pain and decreased hearing.  No other complaints today.  Assessment & Plan:  Principal Problem:   Mastoiditis, acute Active Problems:   Chronic diastolic CHF (congestive heart failure) (HCC)   Coronary artery disease involving native coronary artery of native heart without angina pectoris   Chronic respiratory failure with hypoxia (HCC)   HTN (hypertension)   COPD (chronic obstructive pulmonary disease) (HCC)   Malignant otitis media   Vertigo   Acute mastoiditis, malignant otitis externa -Discussed with Dr. Janace Hoard, today.  Planning for OR tomorrow for tympanostomy tube -Continue vancomycin, aztreonam due to penicillin and cephalosporin allergy -Ofloxacin otic  solution  Vertigo -Likely secondary to above -Meclizine as needed  Chronic hypoxemic respiratory failure -Uses 3 L nasal cannula O2 at baseline.  Without exacerbation of COPD  CAD -Pravachol  Chronic diastolic heart failure -Continue Lasix, without exacerbation of CHF  DVT prophylaxis:  SCDs Start: 04/09/22 2216  Code Status: Full code Family Communication: No family at bedside Disposition Plan:  Status is: Inpatient Remains inpatient appropriate because: OR tomorrow  Consultants:  ENT  Procedures:  None  Antimicrobials:  Anti-infectives (From admission, onward)    Start     Dose/Rate Route Frequency Ordered Stop   04/11/22 0800  vancomycin (VANCOCIN) IVPB 1000 mg/200 mL premix        1,000 mg 200 mL/hr over 60 Minutes Intravenous Every 36 hours 04/09/22 2239     04/10/22 0600  aztreonam (AZACTAM) 1 g in sodium chloride 0.9 % 100 mL IVPB        1 g 200 mL/hr over 30 Minutes Intravenous Every 8 hours 04/09/22 2239     04/09/22 2200  aztreonam (AZACTAM) 2 g in sodium chloride 0.9 % 100 mL IVPB  Status:  Discontinued        2 g 200 mL/hr over 30 Minutes Intravenous Every 8 hours 04/09/22 1931 04/09/22 1937   04/09/22 1945  aztreonam (AZACTAM) 2 g in sodium chloride 0.9 % 100 mL IVPB        2 g 200 mL/hr over 30 Minutes Intravenous  Once 04/09/22 1937 04/09/22 2315   04/09/22 1915  vancomycin (VANCOCIN) IVPB 1000 mg/200 mL premix        1,000 mg 200 mL/hr over 60 Minutes Intravenous  Once 04/09/22 1902 04/09/22 2112        Objective: Vitals:   04/10/22 0208 04/10/22 0449 04/10/22 0844 04/10/22 0929  BP:  (!) 146/63 133/62   Pulse:  80 72   Resp:  18 18   Temp:  (!) 97.5 F (36.4 C) 97.7 F (36.5 C)   TempSrc:  Oral    SpO2: 96% 99% 97% 91%  Weight:      Height:        Intake/Output Summary (Last 24 hours) at 04/10/2022 1347 Last data filed at 04/10/2022 0600 Gross per 24 hour  Intake 259.89 ml  Output 0 ml  Net 259.89 ml   Filed Weights    04/09/22 1821 04/10/22 0106  Weight: 49.9 kg 42.8 kg    Examination:  General exam: Appears calm and comfortable  Respiratory system: Clear to auscultation. Respiratory effort normal. No respiratory distress. No conversational dyspnea.  Cardiovascular system: S1 & S2 heard, RRR. No murmurs. No pedal edema. Gastrointestinal system: Abdomen is nondistended, soft and nontender. Normal bowel sounds heard. Central nervous system: Alert and oriented. No focal neurological deficits. Speech clear.  Extremities: Symmetric in appearance  Psychiatry: Judgement and insight appear normal. Mood & affect appropriate.   Data Reviewed: I have personally reviewed following labs and imaging studies  CBC: Recent Labs  Lab 04/09/22 1910 04/09/22 1919 04/10/22 0553  WBC 5.3  --  5.4  NEUTROABS 3.6  --   --   HGB 10.9* 11.6* 10.4*  HCT 35.8* 34.0* 33.1*  MCV 92.7  --  90.9  PLT 246  --  960   Basic Metabolic Panel: Recent Labs  Lab 04/09/22 1910 04/09/22 1919 04/10/22 0212  NA 137 133* 135  K 4.4 4.4 4.1  CL 94* 92* 89*  CO2 35*  --  34*  GLUCOSE 88 82 88  BUN 9 7* 6*  CREATININE 0.67 0.90 0.74  CALCIUM 9.4  --  9.6   GFR: Estimated Creatinine Clearance: 40.4 mL/min (by C-G formula based on SCr of 0.74 mg/dL). Liver Function Tests: Recent Labs  Lab 04/09/22 1910  AST 21  ALT 13  ALKPHOS 45  BILITOT 0.4  PROT 6.8  ALBUMIN 3.5   No results for input(s): "LIPASE", "AMYLASE" in the last 168 hours. No results for input(s): "AMMONIA" in the last 168 hours. Coagulation Profile: No results for input(s): "INR", "PROTIME" in the last 168 hours. Cardiac Enzymes: No results for input(s): "CKTOTAL", "CKMB", "CKMBINDEX", "TROPONINI" in the last 168 hours. BNP (last 3 results) No results for input(s): "PROBNP" in the last 8760 hours. HbA1C: No results for input(s): "HGBA1C" in the last 72 hours. CBG: No results for input(s): "GLUCAP" in the last 168 hours. Lipid Profile: No results for  input(s): "CHOL", "HDL", "LDLCALC", "TRIG", "CHOLHDL", "LDLDIRECT" in the last 72 hours. Thyroid Function Tests: No results for input(s): "TSH", "T4TOTAL", "FREET4", "T3FREE", "THYROIDAB" in the last 72 hours. Anemia Panel: No results for input(s): "VITAMINB12", "FOLATE", "FERRITIN", "TIBC", "IRON", "RETICCTPCT" in the last 72 hours. Sepsis Labs: No results for input(s): "PROCALCITON", "LATICACIDVEN" in the last 168 hours.  No results found for this or any previous visit (from the past 240 hour(s)).    Radiology Studies: MR BRAIN W WO CONTRAST  Result Date: 04/09/2022 CLINICAL DATA:  Initial evaluation for dizziness. EXAM: MRI HEAD WITHOUT AND WITH CONTRAST TECHNIQUE: Multiplanar, multiecho pulse sequences of the brain and surrounding structures were obtained without and with intravenous contrast. CONTRAST:  50m GADAVIST GADOBUTROL 1 MMOL/ML IV SOLN COMPARISON:  Prior CT from earlier the same day. FINDINGS: Brain: Cerebral volume within normal limits. Moderate chronic microvascular ischemic disease noted involving the periventricular deep white matter both cerebral hemispheres as well as the pons. No evidence for acute or subacute ischemia. Gray-white matter differentiation maintained. No areas of chronic cortical infarction. No acute or chronic intracranial blood products. No mass lesion, midline shift or mass effect. No hydrocephalus or extra-axial fluid collection. Pituitary gland suprasellar region normal. No abnormal enhancement. No evidence for intracranial spread of infection. Vascular: Major intracranial vascular flow voids are maintained. Skull and upper cervical spine: Craniocervical junction normal. Bone marrow signal intensity within normal limits. No scalp soft tissue abnormality. Sinuses/Orbits: Prior bilateral ocular lens replacement. Mild scattered mucosal thickening noted about the ethmoidal air cells. Paranasal sinuses are otherwise clear. Other: Right mastoid and middle ear effusion  with associated enhancement. Findings better characterized on prior temporal bone CT. IMPRESSION: 1. No acute intracranial abnormality. 2. Moderate chronic microvascular ischemic disease. 3. Right mastoid and middle ear effusion, better characterized on prior temporal bone CT. Electronically Signed   By: Jeannine Boga M.D.   On: 04/09/2022 23:05   CT Temporal Bones W Contrast  Result Date: 04/09/2022 CLINICAL DATA:  Right-sided mastoiditis. Your infection. EXAM: CT TEMPORAL BONES WITH CONTRAST TECHNIQUE: Axial and coronal plane CT imaging of the petrous temporal bones was performed with thin-collimation image reconstruction following intravenous contrast administration. Multiplanar CT image reconstructions were also generated. RADIATION DOSE REDUCTION: This exam was performed according to the departmental dose-optimization program which includes automated exposure control, adjustment of the mA and/or kV according to patient size and/or use of iterative reconstruction technique. CONTRAST:  69m OMNIPAQUE IOHEXOL 300 MG/ML  SOLN COMPARISON:  None Available. FINDINGS: RIGHT TEMPORAL BONE External auditory canal: Right external auditory canal is opacified medially. Erosive changes are present along the posterior and superior aspect of the canal. Middle ear cavity: The middle ear cavity is opacified. Ossicles are within normal limits. Fluid or soft tissue extends into the epitympanum. There is some blunting of the scutum. Inner ear structures: The cochlea, vestibule and semicircular canals are normal. The vestibular aqueduct is not enlarged. Internal auditory and facial nerve canals:  Normal Mastoid air cells: Mastoid air cells are diffusely opacified there is some coalescence of cells. Cortex is breech posteriorly adjacent to the sigmoid sinus. LEFT TEMPORAL BONE External auditory canal: Cerumen is present in the medial canal. The external auditory canal is otherwise within normal limits. Middle ear cavity:  Normally aerated. The scutum and ossicles are normal. The tegmen tympani is intact. Inner ear structures: The cochlea, vestibule and semicircular canals are normal. The vestibular aqueduct is not enlarged. Internal auditory and facial nerve canals:  Normal. Mastoid air cells: Normally aerated. No osseous erosion. Vascular: Normal appearance of the carotid canals, jugular bulbs and sigmoid plates. The dural sinuses are patent. Limited intracranial:  Within normal limits. Visible orbits/paranasal sinuses: Bilateral lens replacements are noted. Globes and orbits are otherwise unremarkable. Paranasal sinuses are clear. Soft tissues: Soft tissue enhancement is noted along the lateral inferior aspect of the right mastoid. No abscess is present. IMPRESSION: 1. Right middle ear and mastoid disease with some coalescence of cells and soft tissue along the lateral inferior aspect of the mastoid compatible with acute mastoiditis. 2. Erosive changes along the posterior and superior aspect of the right external auditory canal consistent with aggressive otitis externa. 3. Erosive changes of the right scutum suggesting a chronic process. 4. The posterior right mastoid air cells are  eroded without enhancement along the dura 5. Normal appearance of the left temporal bone. Electronically Signed   By: San Morelle M.D.   On: 04/09/2022 20:21   DG Chest Port 1 View  Result Date: 04/09/2022 CLINICAL DATA:  Shortness of breath EXAM: PORTABLE CHEST 1 VIEW COMPARISON:  03/21/2022 FINDINGS: Transverse diameter of the heart is increased. There are no signs of alveolar pulmonary edema or new focal infiltrates. There is no pleural effusion or pneumothorax. There is possible skin fold overlying the lateral aspect of the right lower lung field. Surgical clips are seen in right chest wall. IMPRESSION: There are no signs of pulmonary edema or new focal infiltrates. Electronically Signed   By: Elmer Picker M.D.   On: 04/09/2022  20:06      Scheduled Meds:  vitamin C  500 mg Oral Daily   cholecalciferol  1,000 Units Oral Daily   fluticasone furoate-vilanterol  1 puff Inhalation Daily   And   umeclidinium bromide  1 puff Inhalation Daily   furosemide  40 mg Oral BID   linaclotide  72 mcg Oral QAC breakfast   loratadine  10 mg Oral q1800   mometasone-formoterol  2 puff Inhalation BID   montelukast  10 mg Oral QHS   nortriptyline  50 mg Oral QHS   ofloxacin  5 drop Right EAR Daily   omega-3 acid ethyl esters  1,000 mg Oral q morning   pantoprazole  40 mg Oral Daily   potassium chloride SA  20 mEq Oral Daily   pravastatin  40 mg Oral q1800   vitamin B-12  1,000 mcg Oral Daily   Continuous Infusions:  aztreonam 1 g (04/10/22 0508)   [START ON 04/11/2022] vancomycin       LOS: 1 day     Dessa Phi, DO [facility] 04/10/2022, 1:47 PM   Available via Epic secure chat 7am-7pm After these hours, please refer to coverage provider listed on [email or web address]

## 2022-04-10 NOTE — Assessment & Plan Note (Signed)
Mildly elevated but likely secondary to pain.

## 2022-04-11 ENCOUNTER — Inpatient Hospital Stay (HOSPITAL_COMMUNITY): Payer: Medicare Other | Admitting: Anesthesiology

## 2022-04-11 ENCOUNTER — Encounter (HOSPITAL_COMMUNITY): Payer: Self-pay | Admitting: Family Medicine

## 2022-04-11 ENCOUNTER — Encounter (HOSPITAL_COMMUNITY)
Admission: EM | Disposition: A | Discharge status: Home / Self Care | Payer: Self-pay | Source: Home / Self Care | Attending: Internal Medicine

## 2022-04-11 DIAGNOSIS — I2510 Atherosclerotic heart disease of native coronary artery without angina pectoris: Secondary | ICD-10-CM

## 2022-04-11 DIAGNOSIS — I10 Essential (primary) hypertension: Secondary | ICD-10-CM

## 2022-04-11 DIAGNOSIS — J449 Chronic obstructive pulmonary disease, unspecified: Secondary | ICD-10-CM

## 2022-04-11 DIAGNOSIS — H7091 Unspecified mastoiditis, right ear: Secondary | ICD-10-CM

## 2022-04-11 DIAGNOSIS — H70001 Acute mastoiditis without complications, right ear: Secondary | ICD-10-CM | POA: Diagnosis not present

## 2022-04-11 HISTORY — PX: MYRINGOTOMY WITH TUBE PLACEMENT: SHX5663

## 2022-04-11 LAB — BASIC METABOLIC PANEL
Anion gap: 11 (ref 5–15)
BUN: 10 mg/dL (ref 8–23)
CO2: 37 mmol/L — ABNORMAL HIGH (ref 22–32)
Calcium: 9.4 mg/dL (ref 8.9–10.3)
Chloride: 86 mmol/L — ABNORMAL LOW (ref 98–111)
Creatinine, Ser: 0.81 mg/dL (ref 0.44–1.00)
GFR, Estimated: >60 mL/min (ref >60)
Glucose, Bld: 100 mg/dL — ABNORMAL HIGH (ref 70–99)
Potassium: 3.8 mmol/L (ref 3.5–5.1)
Sodium: 134 mmol/L — ABNORMAL LOW (ref 135–145)

## 2022-04-11 LAB — SURGICAL PCR SCREEN
MRSA, PCR: NEGATIVE
Staphylococcus aureus: NEGATIVE

## 2022-04-11 SURGERY — MYRINGOTOMY WITH TUBE PLACEMENT
Anesthesia: Monitor Anesthesia Care | Site: Ear | Laterality: Right

## 2022-04-11 MED ORDER — LIDOCAINE-EPINEPHRINE 1 %-1:100000 IJ SOLN
INTRAMUSCULAR | Status: AC
  Filled 2022-04-11: qty 1

## 2022-04-11 MED ORDER — OXYMETAZOLINE HCL 0.05 % NA SOLN
NASAL | Status: AC
  Filled 2022-04-11: qty 30

## 2022-04-11 MED ORDER — ONDANSETRON HCL 4 MG/2ML IJ SOLN
INTRAMUSCULAR | Status: DC | PRN
  Administered 2022-04-11: 4 mg via INTRAVENOUS

## 2022-04-11 MED ORDER — PROPOFOL 10 MG/ML IV BOLUS
INTRAVENOUS | Status: DC | PRN
  Administered 2022-04-11: 20 mg via INTRAVENOUS

## 2022-04-11 MED ORDER — FENTANYL CITRATE (PF) 250 MCG/5ML IJ SOLN
INTRAMUSCULAR | Status: AC
  Filled 2022-04-11: qty 5

## 2022-04-11 MED ORDER — LIDOCAINE-EPINEPHRINE 1 %-1:100000 IJ SOLN
INTRAMUSCULAR | Status: DC | PRN
  Administered 2022-04-11: 6 mL

## 2022-04-11 MED ORDER — EPINEPHRINE PF 1 MG/ML IJ SOLN
INTRAMUSCULAR | Status: AC
  Filled 2022-04-11: qty 2

## 2022-04-11 MED ORDER — DEXAMETHASONE SODIUM PHOSPHATE 10 MG/ML IJ SOLN
INTRAMUSCULAR | Status: AC
  Filled 2022-04-11: qty 1

## 2022-04-11 MED ORDER — ACETAMINOPHEN 10 MG/ML IV SOLN
INTRAVENOUS | Status: DC | PRN
  Administered 2022-04-11: 1000 mg via INTRAVENOUS

## 2022-04-11 MED ORDER — ONDANSETRON HCL 4 MG/2ML IJ SOLN
INTRAMUSCULAR | Status: AC
  Filled 2022-04-11: qty 2

## 2022-04-11 MED ORDER — FENTANYL CITRATE (PF) 100 MCG/2ML IJ SOLN: 25.0000 ug | INTRAMUSCULAR | Status: DC | PRN

## 2022-04-11 MED ORDER — ACETAMINOPHEN 500 MG PO TABS: 1000.0000 mg | ORAL_TABLET | Freq: Once | ORAL | Status: DC

## 2022-04-11 MED ORDER — ORAL CARE MOUTH RINSE: 15.0000 mL | Freq: Once | OROMUCOSAL | Status: AC

## 2022-04-11 MED ORDER — LACTATED RINGERS IV SOLN: INTRAVENOUS | Status: DC

## 2022-04-11 MED ORDER — PROPOFOL 10 MG/ML IV BOLUS
INTRAVENOUS | Status: AC
  Filled 2022-04-11: qty 20

## 2022-04-11 MED ORDER — CHLORHEXIDINE GLUCONATE 0.12 % MT SOLN
15.0000 mL | Freq: Once | OROMUCOSAL | Status: AC
  Administered 2022-04-11: 15 mL via OROMUCOSAL
  Filled 2022-04-11: qty 15

## 2022-04-11 MED ORDER — CIPROFLOXACIN-DEXAMETHASONE 0.3-0.1 % OT SUSP
OTIC | Status: DC | PRN
  Administered 2022-04-11: 4 [drp] via OTIC

## 2022-04-11 MED ORDER — CIPROFLOXACIN-DEXAMETHASONE 0.3-0.1 % OT SUSP
4.0000 [drp] | Freq: Two times a day (BID) | OTIC | Status: DC
  Administered 2022-04-11 – 2022-04-12 (×3): 4 [drp] via OTIC
  Filled 2022-04-11 (×2): qty 7.5

## 2022-04-11 MED ORDER — CIPROFLOXACIN-DEXAMETHASONE 0.3-0.1 % OT SUSP
OTIC | Status: AC
  Filled 2022-04-11: qty 7.5

## 2022-04-11 SURGICAL SUPPLY — 18 items
BAG COUNTER SPONGE SURGICOUNT (BAG) ×2 IMPLANT
BLADE MYRINGOTOMY 6 SPEAR HDL (BLADE) ×2 IMPLANT
CANISTER SUCT 3000ML PPV (MISCELLANEOUS) ×2 IMPLANT
COVER MAYO STAND STRL (DRAPES) ×2 IMPLANT
DRAPE HALF SHEET 40X57 (DRAPES) ×2 IMPLANT
GLOVE ECLIPSE 7.5 STRL STRAW (GLOVE) ×2 IMPLANT
KIT BASIN OR (CUSTOM PROCEDURE TRAY) ×2 IMPLANT
KIT TURNOVER KIT B (KITS) ×2 IMPLANT
NDL PRECISIONGLIDE 27X1.5 (NEEDLE) IMPLANT
NEEDLE PRECISIONGLIDE 27X1.5 (NEEDLE) ×2 IMPLANT
NS IRRIG 1000ML POUR BTL (IV SOLUTION) ×2 IMPLANT
PAD ARMBOARD 7.5X6 YLW CONV (MISCELLANEOUS) ×2 IMPLANT
POSITIONER HEAD DONUT 9IN (MISCELLANEOUS) ×2 IMPLANT
SYR TB 1ML LUER SLIP (SYRINGE) ×1 IMPLANT
TOWEL GREEN STERILE FF (TOWEL DISPOSABLE) ×2 IMPLANT
TUBE CONNECTING 12X1/4 (SUCTIONS) ×2 IMPLANT
TUBE EAR SHEEHY BUTTON 1.27 (OTOLOGIC RELATED) ×2 IMPLANT
TUBING EXTENTION W/L.L. (IV SETS) ×2 IMPLANT

## 2022-04-11 NOTE — Interval H&P Note (Signed)
History and Physical Interval Note:  04/11/2022 7:38 AM  [name]  has presented today for surgery, with the diagnosis of Right Eare Disease.  The various methods of treatment have been discussed with the patient and family. After consideration of risks, benefits and other options for treatment, the patient has consented to  Procedure(s): MYRINGOTOMY WITH TUBE PLACEMENT (Right) as a surgical intervention.  The patient's history has been reviewed, patient examined, no change in status, stable for surgery.  I have reviewed the patient's chart and labs.  Questions were answered to the patient's satisfaction.     [name]

## 2022-04-11 NOTE — Anesthesia Postprocedure Evaluation (Signed)
Anesthesia Post Note  Patient: MISSEY HASLEY  Procedure(s) Performed: TYMPANOSTOMY TUBE RIGHT EAR, EAR CLEANING AND REMOVAL OF FOREIGN BODIES (Right: Ear)     Patient location during evaluation: PACU Anesthesia Type: MAC Level of consciousness: awake and alert, patient cooperative and oriented Pain management: pain level controlled Vital Signs Assessment: post-procedure vital signs reviewed and stable Respiratory status: spontaneous breathing, nonlabored ventilation, respiratory function stable and patient connected to nasal cannula oxygen Cardiovascular status: stable and blood pressure returned to baseline Postop Assessment: no apparent nausea or vomiting Anesthetic complications: no   No notable events documented.  Last Vitals:  Vitals:   04/11/22 0740 04/11/22 0820  BP: 130/67 134/66  Pulse: 71 62  Resp:  16  Temp:  36.8 C  SpO2: 91% 93%    Last Pain:  Vitals:   04/11/22 0820  TempSrc:   PainSc: 0-No pain                 Keaira Whitehurst,E. Baxter Gonzalez

## 2022-04-11 NOTE — Op Note (Signed)
Preop/postop diagnosis: Right tympanic mastoiditis Procedure: Ear cleaning, removal of foreign bodies, and tympanostomy tube of the right ear Anesthesia MAC and local Estimated blood loss less than 1 cc Indications: 76 year old with a ear problem for at least a month.  She has had CT scan showing mastoiditis.  She has some erosion of the ear canal.  Now she needs a cleaning and tympanostomy tube as the first initial treatment.  Risk, benefits, and options were discussed.  All questions were answered and consent was obtained. Procedure: Patient was taken the operating placed supine position after some sedation she was injected with 1% lidocaine with 1 100,000 epinephrine in the canal.  Once this was performed the ear was suctioned and there was obvious cotton foreign body in the canal that was removed with alligator forcep.  Once this was removed more suctioning revealed a second foreign body cottonball that was stuffed down onto the surface of the tympanic membrane.  It was removed with some exudative material on it.  Once removed the ear was cleaned and the tympanic membrane could be visualized.  It was thickened.  There was no evidence of cholesteatoma.  A myringotomy made in the anterior inferior quadrant.  There was serous effusion suctioned and a PE tube placed without difficulty.  Ciprodex was instilled.  The middle ear was thickened but no evidence of epithelial debris.  The patient tolerated the procedure well and was taken to recovery in stable condition counts correct

## 2022-04-11 NOTE — Telephone Encounter (Signed)
Pt ready for scheduling on or after 05/19/22  Out-of-pocket cost due at time of visit: $0  Primary: Medicare [name] co-insurance: 0% Admin fee co-insurance: 0%   Secondary: Medicaid [name] co-insurance: 0% Admin fee co-insurance: 0%  Deductible: does not apply  Prior Auth: not required PA# Valid:     ** This summary of benefits is an estimation of the patient's out-of-pocket cost. Exact cost may very based on individual plan coverage.

## 2022-04-11 NOTE — Progress Notes (Signed)
PROGRESS NOTE    [name]  BDZ:[id number] DOB: December 13, 1945 DOA: 04/09/2022 PCP: [name], MD     Brief Narrative:  [name] is a 76 y.o. 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 She was recently diagnosed with chronic otitis externa and started on ofloxacin and Cortisporin drops by PCP on 03/03/2022. She was evaluated at ED on 03/21/2022 with CT of the temporal bone performed concerning for otomastoiditis.Dr. Melene Plan of ENT was consulted and felt that pt did not have mastoiditis and recommended initiation of Ciprodex drops and Augmentin.  Given her antibiotic allergy, she was given ciprofloxacin and ofloxacin otic solution.  She was advised to follow-up with ENT which she has not done. She continues to have pain to right ear with slight hearing loss. Has severe vertigo and would fall forward each time she tries to get up. CT temporal bone now showing acute mastoiditis and aggressive otitis externa of the right ear. ENT consulted.   New events last 24 hours / Subjective: Patient seen after procedure today.  She underwent ear cleaning, removal of foreign bodies, tympanostomy tube of right ear with Dr. Janace Hoard.  She feels that her hearing has improved on that side.  No new complaints.  Assessment & Plan:  Principal Problem:   Mastoiditis, acute Active Problems:   Chronic diastolic CHF (congestive heart failure) (HCC)   Coronary artery disease involving native coronary artery of native heart without angina pectoris   Chronic respiratory failure with hypoxia (HCC)   HTN (hypertension)   COPD (chronic obstructive pulmonary disease) (HCC)   Malignant otitis media   Vertigo   Acute mastoiditis, malignant otitis externa -S/p ear cleaning, removal of foreign bodies, and tympanostomy tube  of the right ear 7/16 -Continue vancomycin, aztreonam due to penicillin and cephalosporin allergy -Ofloxacin otic solution  Vertigo -Likely secondary to above -Meclizine as needed  Chronic hypoxemic respiratory failure -Uses 3 L nasal cannula O2 at baseline.  Without exacerbation of COPD  CAD -Pravachol  Chronic diastolic heart failure -Continue Lasix, without exacerbation of CHF  DVT prophylaxis:  SCDs Start: 04/09/22 2216  Code Status: Full code Family Communication: No family at bedside Disposition Plan:  Status is: Inpatient Remains inpatient appropriate because: IV antibiotics    Antimicrobials:  Anti-infectives (From admission, onward)    Start     Dose/Rate Route Frequency Ordered Stop   04/11/22 1930  vancomycin (VANCOREADY) IVPB 1250 mg/250 mL        1,250 mg 166.7 mL/hr over 90 Minutes Intravenous Every 48 hours 04/10/22 1541     04/11/22 0800  vancomycin (VANCOCIN) IVPB 1000 mg/200 mL premix  Status:  Discontinued        1,000 mg 200 mL/hr over 60 Minutes Intravenous Every 36 hours 04/09/22 2239 04/10/22 1541   04/10/22 0600  aztreonam (AZACTAM) 1 g in sodium chloride 0.9 % 100 mL IVPB        1 g 200 mL/hr over 30 Minutes Intravenous Every 8 hours 04/09/22 2239     04/09/22 2200  aztreonam (AZACTAM) 2 g in sodium chloride 0.9 % 100 mL IVPB  Status:  Discontinued        2 g 200 mL/hr over 30 Minutes Intravenous Every 8 hours 04/09/22 1931 04/09/22 1937   04/09/22 1945  aztreonam (AZACTAM) 2 g in sodium chloride  0.9 % 100 mL IVPB        2 g 200 mL/hr over 30 Minutes Intravenous  Once 04/09/22 1937 04/09/22 2315   04/09/22 1915  vancomycin (VANCOCIN) IVPB 1000 mg/200 mL premix        1,000 mg 200 mL/hr over 60 Minutes Intravenous  Once 04/09/22 1902 04/09/22 2112        Objective: Vitals:   04/11/22 0740 04/11/22 0820 04/11/22 0835 04/11/22 0933  BP: 130/67 134/66 132/78 109/62  Pulse: 71 62 68 71  Resp:  '16 16 17  '$ Temp:  98.3 F (36.8 C) 98.4 F  (36.9 C) 98 F (36.7 C)  TempSrc:      SpO2: 91% 93% 95% 90%  Weight: 46.3 kg     Height: 5' 2.01" (1.575 m)       Intake/Output Summary (Last 24 hours) at 04/11/2022 1247 Last data filed at 04/11/2022 4193 Gross per 24 hour  Intake 1000 ml  Output --  Net 1000 ml    Filed Weights   04/10/22 1500 04/10/22 2109 04/11/22 0740  Weight: 46.2 kg 46.3 kg 46.3 kg    Examination:  General exam: Appears calm and comfortable  Respiratory system: Clear to auscultation. Respiratory effort normal. No respiratory distress. No conversational dyspnea.  Cardiovascular system: S1 & S2 heard, RRR. No murmurs. No pedal edema. Gastrointestinal system: Abdomen is nondistended, soft and nontender. Normal bowel sounds heard. Central nervous system: Alert and oriented. No focal neurological deficits. Speech clear.  Extremities: Symmetric in appearance  Psychiatry: Judgement and insight appear normal. Mood & affect appropriate.   Data Reviewed: I have personally reviewed following labs and imaging studies  CBC: Recent Labs  Lab 04/09/22 1910 04/09/22 1919 04/10/22 0553  WBC 5.3  --  5.4  NEUTROABS 3.6  --   --   HGB 10.9* 11.6* 10.4*  HCT 35.8* 34.0* 33.1*  MCV 92.7  --  90.9  PLT 246  --  790    Basic Metabolic Panel: Recent Labs  Lab 04/09/22 1910 04/09/22 1919 04/10/22 0212 04/11/22 0102  NA 137 133* 135 134*  K 4.4 4.4 4.1 3.8  CL 94* 92* 89* 86*  CO2 35*  --  34* 37*  GLUCOSE 88 82 88 100*  BUN 9 7* 6* 10  CREATININE 0.67 0.90 0.74 0.81  CALCIUM 9.4  --  9.6 9.4    GFR: Estimated Creatinine Clearance: 43.2 mL/min (by C-G formula based on SCr of 0.81 mg/dL). Liver Function Tests: Recent Labs  Lab 04/09/22 1910  AST 21  ALT 13  ALKPHOS 45  BILITOT 0.4  PROT 6.8  ALBUMIN 3.5    No results for input(s): "LIPASE", "AMYLASE" in the last 168 hours. No results for input(s): "AMMONIA" in the last 168 hours. Coagulation Profile: No results for input(s): "INR",  "PROTIME" in the last 168 hours. Cardiac Enzymes: No results for input(s): "CKTOTAL", "CKMB", "CKMBINDEX", "TROPONINI" in the last 168 hours. BNP (last 3 results) No results for input(s): "PROBNP" in the last 8760 hours. HbA1C: No results for input(s): "HGBA1C" in the last 72 hours. CBG: No results for input(s): "GLUCAP" in the last 168 hours. Lipid Profile: No results for input(s): "CHOL", "HDL", "LDLCALC", "TRIG", "CHOLHDL", "LDLDIRECT" in the last 72 hours. Thyroid Function Tests: No results for input(s): "TSH", "T4TOTAL", "FREET4", "T3FREE", "THYROIDAB" in the last 72 hours. Anemia Panel: No results for input(s): "VITAMINB12", "FOLATE", "FERRITIN", "TIBC", "IRON", "RETICCTPCT" in the last 72 hours. Sepsis Labs: No results for input(s): "PROCALCITON", "LATICACIDVEN"  in the last 168 hours.  Recent Results (from the past 240 hour(s))  Surgical pcr screen     Status: None   Collection Time: 04/11/22  3:04 AM   Specimen: Nasal Mucosa; Nasal Swab  Result Value Ref Range Status   MRSA, PCR NEGATIVE NEGATIVE Final   Staphylococcus aureus NEGATIVE NEGATIVE Final    Comment: (NOTE) The Xpert SA Assay (FDA approved for NASAL specimens in patients 74 years of age and older), is one component of a comprehensive surveillance program. It is not intended to diagnose infection nor to guide or monitor treatment. Performed at [facility], [place] [address] [place], [place] [address]       Radiology Studies: MR BRAIN W WO CONTRAST  Result Date: 04/09/2022 CLINICAL DATA:  Initial evaluation for dizziness. EXAM: MRI HEAD WITHOUT AND WITH CONTRAST TECHNIQUE: Multiplanar, multiecho pulse sequences of the brain and surrounding structures were obtained without and with intravenous contrast. CONTRAST:  46m GADAVIST GADOBUTROL 1 MMOL/ML IV SOLN COMPARISON:  Prior CT from earlier the same day. FINDINGS: Brain: Cerebral volume within normal limits. Moderate chronic microvascular ischemic disease  noted involving the periventricular deep white matter both cerebral hemispheres as well as the pons. No evidence for acute or subacute ischemia. Gray-white matter differentiation maintained. No areas of chronic cortical infarction. No acute or chronic intracranial blood products. No mass lesion, midline shift or mass effect. No hydrocephalus or extra-axial fluid collection. Pituitary gland suprasellar region normal. No abnormal enhancement. No evidence for intracranial spread of infection. Vascular: Major intracranial vascular flow voids are maintained. Skull and upper cervical spine: Craniocervical junction normal. Bone marrow signal intensity within normal limits. No scalp soft tissue abnormality. Sinuses/Orbits: Prior bilateral ocular lens replacement. Mild scattered mucosal thickening noted about the ethmoidal air cells. Paranasal sinuses are otherwise clear. Other: Right mastoid and middle ear effusion with associated enhancement. Findings better characterized on prior temporal bone CT. IMPRESSION: 1. No acute intracranial abnormality. 2. Moderate chronic microvascular ischemic disease. 3. Right mastoid and middle ear effusion, better characterized on prior temporal bone CT. Electronically Signed   By: BJeannine BogaM.D.   On: 04/09/2022 23:05   CT Temporal Bones W Contrast  Result Date: 04/09/2022 CLINICAL DATA:  Right-sided mastoiditis. Your infection. EXAM: CT TEMPORAL BONES WITH CONTRAST TECHNIQUE: Axial and coronal plane CT imaging of the petrous temporal bones was performed with thin-collimation image reconstruction following intravenous contrast administration. Multiplanar CT image reconstructions were also generated. RADIATION DOSE REDUCTION: This exam was performed according to the departmental dose-optimization program which includes automated exposure control, adjustment of the mA and/or kV according to patient size and/or use of iterative reconstruction technique. CONTRAST:  779m OMNIPAQUE IOHEXOL 300 MG/ML  SOLN COMPARISON:  None Available. FINDINGS: RIGHT TEMPORAL BONE External auditory canal: Right external auditory canal is opacified medially. Erosive changes are present along the posterior and superior aspect of the canal. Middle ear cavity: The middle ear cavity is opacified. Ossicles are within normal limits. Fluid or soft tissue extends into the epitympanum. There is some blunting of the scutum. Inner ear structures: The cochlea, vestibule and semicircular canals are normal. The vestibular aqueduct is not enlarged. Internal auditory and facial nerve canals:  Normal Mastoid air cells: Mastoid air cells are diffusely opacified there is some coalescence of cells. Cortex is breech posteriorly adjacent to the sigmoid sinus. LEFT TEMPORAL BONE External auditory canal: Cerumen is present in the medial canal. The external auditory canal is otherwise within normal limits. Middle ear cavity:  Normally aerated. The scutum and ossicles are normal. The tegmen tympani is intact. Inner ear structures: The cochlea, vestibule and semicircular canals are normal. The vestibular aqueduct is not enlarged. Internal auditory and facial nerve canals:  Normal. Mastoid air cells: Normally aerated. No osseous erosion. Vascular: Normal appearance of the carotid canals, jugular bulbs and sigmoid plates. The dural sinuses are patent. Limited intracranial:  Within normal limits. Visible orbits/paranasal sinuses: Bilateral lens replacements are noted. Globes and orbits are otherwise unremarkable. Paranasal sinuses are clear. Soft tissues: Soft tissue enhancement is noted along the lateral inferior aspect of the right mastoid. No abscess is present. IMPRESSION: 1. Right middle ear and mastoid disease with some coalescence of cells and soft tissue along the lateral inferior aspect of the mastoid compatible with acute mastoiditis. 2. Erosive changes along the posterior and superior aspect of the right external auditory  canal consistent with aggressive otitis externa. 3. Erosive changes of the right scutum suggesting a chronic process. 4. The posterior right mastoid air cells are eroded without enhancement along the dura 5. Normal appearance of the left temporal bone. Electronically Signed   By: San Morelle M.D.   On: 04/09/2022 20:21   DG Chest Port 1 View  Result Date: 04/09/2022 CLINICAL DATA:  Shortness of breath EXAM: PORTABLE CHEST 1 VIEW COMPARISON:  03/21/2022 FINDINGS: Transverse diameter of the heart is increased. There are no signs of alveolar pulmonary edema or new focal infiltrates. There is no pleural effusion or pneumothorax. There is possible skin fold overlying the lateral aspect of the right lower lung field. Surgical clips are seen in right chest wall. IMPRESSION: There are no signs of pulmonary edema or new focal infiltrates. Electronically Signed   By: Elmer Picker M.D.   On: 04/09/2022 20:06      Scheduled Meds:  vitamin C  500 mg Oral Daily   cholecalciferol  1,000 Units Oral Daily   ciprofloxacin-dexamethasone  4 drop Right EAR BID   fluticasone furoate-vilanterol  1 puff Inhalation Daily   And   umeclidinium bromide  1 puff Inhalation Daily   furosemide  40 mg Oral BID   linaclotide  72 mcg Oral QAC breakfast   loratadine  10 mg Oral q1800   mometasone-formoterol  2 puff Inhalation BID   montelukast  10 mg Oral QHS   nortriptyline  50 mg Oral QHS   omega-3 acid ethyl esters  1,000 mg Oral q morning   pantoprazole  40 mg Oral Daily   potassium chloride SA  20 mEq Oral Daily   pravastatin  40 mg Oral q1800   vitamin B-12  1,000 mcg Oral Daily   Continuous Infusions:  aztreonam Stopped (04/11/22 0717)   vancomycin       LOS: 2 days     Dessa Phi, DO [facility] 04/11/2022, 12:47 PM   Available via Epic secure chat 7am-7pm After these hours, please refer to coverage provider listed on [email or web address]

## 2022-04-11 NOTE — Plan of Care (Signed)
  Problem: Clinical Measurements: Goal: Diagnostic test results will improve Outcome: Progressing Goal: Cardiovascular complication will be avoided Outcome: Progressing   Problem: Activity: Goal: Risk for activity intolerance will decrease Outcome: Progressing   Problem: Nutrition: Goal: Adequate nutrition will be maintained Outcome: Progressing

## 2022-04-11 NOTE — Anesthesia Procedure Notes (Signed)
Procedure Name: MAC Date/Time: 04/11/2022 7:55 AM  Performed by: Dorthea Cove, CRNAPre-anesthesia Checklist: Patient identified, Emergency Drugs available, Suction available, Patient being monitored and Timeout performed Patient Re-evaluated:Patient Re-evaluated prior to induction Oxygen Delivery Method: Nasal cannula Preoxygenation: Pre-oxygenation with 100% oxygen Induction Type: IV induction Placement Confirmation: positive ETCO2 and CO2 detector Dental Injury: Teeth and Oropharynx as per pre-operative assessment

## 2022-04-11 NOTE — Transfer of Care (Signed)
Immediate Anesthesia Transfer of Care Note  Patient: [name]  Procedure(s) Performed: MYRINGOTOMY WITH TUBE PLACEMENT (Right: Ear)  Patient Location: PACU  Anesthesia Type:MAC  Level of Consciousness: awake, alert  and oriented  Airway & Oxygen Therapy: Patient Spontanous Breathing and Patient connected to nasal cannula oxygen  Post-op Assessment: Report given to RN and Post -op Vital signs reviewed and stable  Post vital signs: Reviewed and stable  Last Vitals:  Vitals Value Taken Time  BP 134/66 04/11/22 0820  Temp    Pulse 68 04/11/22 0823  Resp 20 04/11/22 0823  SpO2 90 % 04/11/22 0823  Vitals shown include unvalidated device data.  Last Pain:  Vitals:   04/11/22 0445  TempSrc: Oral  PainSc:          Complications: No notable events documented.

## 2022-04-11 NOTE — Progress Notes (Signed)
Pharmacy Antibiotic Note  [name] is a 76 y.o. female admitted on 04/09/2022 with a CT temporal bone that showed mastoiditis.Marland Kitchen  Pharmacy has been consulted for vancomycin and aztreonam dosing.  Plan: Adjust Vancomycin 1250gm IV q48h (AUC 461, Scr 0.81) aztreonam 1gm IV q8h Follow renal function, cultures and clinical course  Height: 5' 2.01" (157.5 cm) Weight: 46.3 kg (101 lb 15.8 oz) IBW/kg (Calculated) : 50.12  Temp (24hrs), Avg:98 F (36.7 C), Min:97.5 F (36.4 C), Max:98.4 F (36.9 C)  Recent Labs  Lab 04/09/22 1910 04/09/22 1919 04/10/22 0212 04/10/22 0553 04/11/22 0102  WBC 5.3  --   --  5.4  --   CREATININE 0.67 0.90 0.74  --  0.81     Estimated Creatinine Clearance: 43.2 mL/min (by C-G formula based on SCr of 0.81 mg/dL).    Allergies  Allergen Reactions   Ceftriaxone Anaphylaxis and Other (See Comments)    *ROCEPHIN*  "Blow up like a balloon"   Hydroxyzine Shortness Of Breath and Other (See Comments)    Pt states med make her light headed, get sob sxs   Doxycycline Nausea And Vomiting   Lexapro [Escitalopram] Other (See Comments)    Pt states med make her dizzy    Assessment: Previous dose of '1000mg'$  Q36h therapeutic (eAUC 491.4, Scr 0.8). Adjustment to q48h dosing preferred for ease of administration.   Renal function unchanged, WBC WNL, afebrile  Antimicrobials this admission: 7/14 vanc >> 7/14 aztreonam >>  Dose adjustments this admission: Vanc '1000mg'$  Q36h >> Vanc '1250mg'$  Q48h  Microbiology results: MRSA PCR: negative   Thank you for allowing pharmacy to be a part of this patient's care.  Titus Dubin, PharmD PGY1 Pharmacy Resident 04/11/2022 3:23 PM

## 2022-04-12 ENCOUNTER — Encounter (HOSPITAL_COMMUNITY): Payer: Self-pay | Admitting: Otolaryngology

## 2022-04-12 DIAGNOSIS — H70001 Acute mastoiditis without complications, right ear: Secondary | ICD-10-CM | POA: Diagnosis not present

## 2022-04-12 MED ORDER — CIPROFLOXACIN HCL 500 MG PO TABS: 500.0000 mg | ORAL_TABLET | Freq: Two times a day (BID) | ORAL | 0 refills | Status: AC

## 2022-04-12 MED ORDER — OFLOXACIN 0.3 % OT SOLN: 5.0000 [drp] | Freq: Every day | OTIC | 0 refills | Status: AC

## 2022-04-12 NOTE — Progress Notes (Signed)
AVS reviewed and pt verbalized understanding of DC teaching and instructions. Pts ride will be here around 3pm she states with home 02 tank. Pt has all pt belongings in her possession.

## 2022-04-12 NOTE — Consult Note (Signed)
   [facility] Inpatient Consult   04/12/2022  [name] [birth year] [id number]  [name]  Primary Care Provider:  [name], MD, [place] [facility] is an Embedded provider   Patient screened for hospitalization to assess for potential [place] [facility] service needs for post hospital transition.  Review of patient's medical record reveals patient is had previously been involved with the care team. Came by the room to speak with patient regarding any needs and patient had transitioned home.  No acute needs noted.  Provider office is listed for the [facility] follow up.  For questions contact:   Natividad Brood, RN BSN [facility] Liaison  [phone] business mobile phone Toll free office [phone]  Fax number: [phone] [email or web address]

## 2022-04-12 NOTE — Discharge Summary (Signed)
Physician Discharge Summary  [name] XBD:[id number] DOB: 1946/06/27 DOA: 04/09/2022  PCP: [name], MD  Admit date: 04/09/2022 Discharge date: 04/12/2022  Admitted From: Home Disposition:  Home   Recommendations for Outpatient Follow-up:  Follow up with Dr. Janace Hoard in 2 to 3 weeks.  Discharge Condition: Stable CODE STATUS: Full  Diet recommendation: Heart healthy   Brief/Interim Summary: [name] is a 76 y.o. 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 She was recently diagnosed with chronic otitis externa and started on ofloxacin and Cortisporin drops by PCP on 03/03/2022. She was evaluated at ED on 03/21/2022 with CT of the temporal bone performed concerning for otomastoiditis.Dr. Melene Plan of ENT was consulted and felt that pt did not have mastoiditis and recommended initiation of Ciprodex drops and Augmentin.  Given her antibiotic allergy, she was given ciprofloxacin and ofloxacin otic solution.  She was advised to follow-up with ENT which she has not done. She continues to have pain to right ear with slight hearing loss. Has severe vertigo and would fall forward each time she tries to get up. CT temporal bone now showing acute mastoiditis and aggressive otitis externa of the right ear. ENT consulted. She was given IV antibiotics and underwent ear cleaning, removal of foreign bodies, and tympanostomy tube of the right ear 7/16 with Dr. Janace Hoard.  She had great improvement and was discharged home in stable condition.  Discharge Diagnoses:   Principal Problem:   Mastoiditis, acute Active Problems:   Chronic diastolic CHF (congestive heart failure) (HCC)   Coronary artery disease involving native coronary artery of native heart without angina pectoris   Chronic respiratory failure with  hypoxia (HCC)   HTN (hypertension)   COPD (chronic obstructive pulmonary disease) (HCC)   Malignant otitis media   Vertigo   Acute mastoiditis, malignant otitis externa -S/p ear cleaning, removal of foreign bodies, and tympanostomy tube of the right ear 7/16 -Ofloxacin otic drops (ciprodex not on formulary) and ciprofloxacin p.o. for 2 weeks -Follow-up with Dr. Janace Hoard as outpatient  Vertigo -Likely secondary to above  Chronic hypoxemic respiratory failure -Uses 3 L nasal cannula O2 at baseline.  Without exacerbation of COPD  CAD -Pravachol  Chronic diastolic heart failure -Continue Lasix, without exacerbation of CHF  Discharge Instructions  Discharge Instructions     (HEART FAILURE PATIENTS) Call MD:  Anytime you have any of the following symptoms: 1) 3 pound weight gain in 24 hours or 5 pounds in 1 week 2) shortness of breath, with or without a dry hacking cough 3) swelling in the hands, feet or stomach 4) if you have to sleep on extra pillows at night in order to breathe.   Complete by: As directed    Call MD for:  difficulty breathing, headache or visual disturbances   Complete by: As directed    Call MD for:  extreme fatigue   Complete by: As directed    Call MD for:  hives   Complete by: As directed    Call MD for:  persistant dizziness or light-headedness   Complete by: As directed    Call MD for:  persistant nausea and vomiting   Complete by: As directed    Call MD for:  severe uncontrolled pain   Complete by: As directed    Call MD for:  temperature >100.4  Complete by: As directed    Diet - low sodium heart healthy   Complete by: As directed    Discharge instructions   Complete by: As directed    You were cared for by a hospitalist during your hospital stay. If you have any questions about your discharge medications or the care you received while you were in the hospital after you are discharged, you can call the unit and ask to speak with the hospitalist on  call if the hospitalist that took care of you is not available. Once you are discharged, your primary care physician will handle any further medical issues. Please note that NO REFILLS for any discharge medications will be authorized once you are discharged, as it is imperative that you return to your primary care physician (or establish a relationship with a primary care physician if you do not have one) for your aftercare needs so that they can reassess your need for medications and monitor your lab values.   Increase activity slowly   Complete by: As directed    No wound care   Complete by: As directed       Allergies as of 04/12/2022       Reactions   Ceftriaxone Anaphylaxis, Other (See Comments)   *ROCEPHIN*  "Blow up like a balloon"   Hydroxyzine Shortness Of Breath, Other (See Comments)   Pt states med make her light headed, get sob sxs   Doxycycline Nausea And Vomiting   Lexapro [escitalopram] Other (See Comments)   Pt states med make her dizzy        Medication List     STOP taking these medications    fluconazole 150 MG tablet Commonly known as: DIFLUCAN       TAKE these medications    ALPRAZolam 1 MG tablet Commonly known as: XANAX TAKE 1 TABLET BY MOUTH 3 TIMES DAILY AS NEEDED FOR ANXIETY.   Atrovent HFA 17 MCG/ACT inhaler Generic drug: ipratropium INHALE 2 PUFFS INTO THE LUNGS EVERY 4 HOURS AS NEEDED What changed: See the new instructions.   budesonide-formoterol 160-4.5 MCG/ACT inhaler Commonly known as: SYMBICORT Inhale 2 puffs into the lungs 2 (two) times daily.   butalbital-acetaminophen-caffeine 50-325-40 MG tablet Commonly known as: FIORICET TAKE 1 TABLET BY MOUTH EVERY 4 HOURS AS NEEDED FOR HEADACHE. What changed: See the new instructions.   cholecalciferol 1000 units tablet Commonly known as: VITAMIN D Take 1,000 Units by mouth daily.   ciprofloxacin 500 MG tablet Commonly known as: Cipro Take 1 tablet (500 mg total) by mouth 2 (two)  times daily for 14 days.   diclofenac Sodium 1 % Gel Commonly known as: VOLTAREN APPLY 2 GRAMS TO AFFECTED AREA 4 TIMES A DAY What changed: See the new instructions.   Enulose 10 GM/15ML Soln Generic drug: lactulose (encephalopathy) Take 45 mLs (30 g total) by mouth 3 (three) times daily as needed (constipation).   furosemide 40 MG tablet Commonly known as: LASIX TAKE 1 TABLET BY MOUTH TWICE A DAY What changed: when to take this   ipratropium-albuterol 0.5-2.5 (3) MG/3ML Soln Commonly known as: DUONEB INHALE 3 MLS INTO THE LUNGS EVERY 6 HOURS AS NEEDED (FOR FOR SHORTNESS OF BREATH Black). What changed: See the new instructions.   Klor-Con M20 20 MEQ tablet Generic drug: potassium chloride SA TAKE 1 TABLET BY MOUTH EVERY DAY What changed: how much to take   levocetirizine 5 MG tablet Commonly known as: XYZAL TAKE 1 TABLET BY MOUTH EVERY DAY IN  THE EVENING What changed:  how much to take how to take this   Linzess 72 MCG capsule Generic drug: linaclotide TAKE 1 CAPSULE BY MOUTH EVERY DAY BEFORE BREAKFAST What changed: See the new instructions.   Livalo 2 MG Tabs Generic drug: Pitavastatin Calcium TAKE 1 TABLET BY MOUTH EVERY DAY What changed:  how much to take when to take this   mometasone-formoterol 200-5 MCG/ACT Aero Commonly known as: DULERA Inhale 2 puffs into the lungs 2 (two) times daily.   montelukast 10 MG tablet Commonly known as: SINGULAIR TAKE 1 TABLET BY MOUTH EVERYDAY AT BEDTIME What changed: See the new instructions.   nortriptyline 25 MG capsule Commonly known as: PAMELOR TAKE 2 CAPSULES BY MOUTH AT BEDTIME   ofloxacin 0.3 % OTIC solution Commonly known as: FLOXIN Place 5 drops into the right ear daily for 14 days.   Omega 3 1200 MG Caps Take 1,200 mg by mouth every morning.   OXYGEN Inhale 3 L into the lungs See admin instructions. Uses when needed during the day, uses continuous throughout the night   pantoprazole 40 MG  tablet Commonly known as: PROTONIX Take 1 tablet (40 mg total) by mouth daily.   polyvinyl alcohol 1.4 % ophthalmic solution Commonly known as: LIQUIFILM TEARS Place 1 drop into both eyes as needed for dry eyes.   Trelegy Ellipta 100-62.5-25 MCG/ACT Aepb Generic drug: Fluticasone-Umeclidin-Vilant Inhale 1 puff into the lungs daily.   Ventolin HFA 108 (90 Base) MCG/ACT inhaler Generic drug: albuterol INHALE 1-2 PUFFS BY MOUTH EVERY 6 HOURS AS NEEDED FOR WHEEZE OR SHORTNESS OF BREATH What changed: See the new instructions.   vitamin B-12 1000 MCG tablet Commonly known as: CYANOCOBALAMIN Take 1 tablet (1,000 mcg total) by mouth daily.   vitamin C 500 MG tablet Commonly known as: ASCORBIC ACID Take 500 mg by mouth daily.        Follow-up Information     Melissa Montane, MD. Schedule an appointment as soon as possible for a visit in 2 week(s).   Specialty: Otolaryngology Why: You can call [phone] for follow up. If issues with that number, you can call [phone] to get in touch with Dr. Janace Hoard personally. Contact information: [address] [place] [address] [phone]                Allergies  Allergen Reactions   Ceftriaxone Anaphylaxis and Other (See Comments)    *ROCEPHIN*  "Blow up like a balloon"   Hydroxyzine Shortness Of Breath and Other (See Comments)    Pt states med make her light headed, get sob sxs   Doxycycline Nausea And Vomiting   Lexapro [Escitalopram] Other (See Comments)    Pt states med make her dizzy    Consultations: ENT    Procedures/Studies: MR BRAIN W WO CONTRAST  Result Date: 04/09/2022 CLINICAL DATA:  Initial evaluation for dizziness. EXAM: MRI HEAD WITHOUT AND WITH CONTRAST TECHNIQUE: Multiplanar, multiecho pulse sequences of the brain and surrounding structures were obtained without and with intravenous contrast. CONTRAST:  7m GADAVIST GADOBUTROL 1 MMOL/ML IV SOLN COMPARISON:  Prior CT from earlier the same  day. FINDINGS: Brain: Cerebral volume within normal limits. Moderate chronic microvascular ischemic disease noted involving the periventricular deep white matter both cerebral hemispheres as well as the pons. No evidence for acute or subacute ischemia. Gray-white matter differentiation maintained. No areas of chronic cortical infarction. No acute or chronic intracranial blood products. No mass lesion, midline shift or mass effect. No hydrocephalus  or extra-axial fluid collection. Pituitary gland suprasellar region normal. No abnormal enhancement. No evidence for intracranial spread of infection. Vascular: Major intracranial vascular flow voids are maintained. Skull and upper cervical spine: Craniocervical junction normal. Bone marrow signal intensity within normal limits. No scalp soft tissue abnormality. Sinuses/Orbits: Prior bilateral ocular lens replacement. Mild scattered mucosal thickening noted about the ethmoidal air cells. Paranasal sinuses are otherwise clear. Other: Right mastoid and middle ear effusion with associated enhancement. Findings better characterized on prior temporal bone CT. IMPRESSION: 1. No acute intracranial abnormality. 2. Moderate chronic microvascular ischemic disease. 3. Right mastoid and middle ear effusion, better characterized on prior temporal bone CT. Electronically Signed   By: Jeannine Boga M.D.   On: 04/09/2022 23:05   CT Temporal Bones W Contrast  Result Date: 04/09/2022 CLINICAL DATA:  Right-sided mastoiditis. Your infection. EXAM: CT TEMPORAL BONES WITH CONTRAST TECHNIQUE: Axial and coronal plane CT imaging of the petrous temporal bones was performed with thin-collimation image reconstruction following intravenous contrast administration. Multiplanar CT image reconstructions were also generated. RADIATION DOSE REDUCTION: This exam was performed according to the departmental dose-optimization program which includes automated exposure control, adjustment of the mA  and/or kV according to patient size and/or use of iterative reconstruction technique. CONTRAST:  68m OMNIPAQUE IOHEXOL 300 MG/ML  SOLN COMPARISON:  None Available. FINDINGS: RIGHT TEMPORAL BONE External auditory canal: Right external auditory canal is opacified medially. Erosive changes are present along the posterior and superior aspect of the canal. Middle ear cavity: The middle ear cavity is opacified. Ossicles are within normal limits. Fluid or soft tissue extends into the epitympanum. There is some blunting of the scutum. Inner ear structures: The cochlea, vestibule and semicircular canals are normal. The vestibular aqueduct is not enlarged. Internal auditory and facial nerve canals:  Normal Mastoid air cells: Mastoid air cells are diffusely opacified there is some coalescence of cells. Cortex is breech posteriorly adjacent to the sigmoid sinus. LEFT TEMPORAL BONE External auditory canal: Cerumen is present in the medial canal. The external auditory canal is otherwise within normal limits. Middle ear cavity: Normally aerated. The scutum and ossicles are normal. The tegmen tympani is intact. Inner ear structures: The cochlea, vestibule and semicircular canals are normal. The vestibular aqueduct is not enlarged. Internal auditory and facial nerve canals:  Normal. Mastoid air cells: Normally aerated. No osseous erosion. Vascular: Normal appearance of the carotid canals, jugular bulbs and sigmoid plates. The dural sinuses are patent. Limited intracranial:  Within normal limits. Visible orbits/paranasal sinuses: Bilateral lens replacements are noted. Globes and orbits are otherwise unremarkable. Paranasal sinuses are clear. Soft tissues: Soft tissue enhancement is noted along the lateral inferior aspect of the right mastoid. No abscess is present. IMPRESSION: 1. Right middle ear and mastoid disease with some coalescence of cells and soft tissue along the lateral inferior aspect of the mastoid compatible with acute  mastoiditis. 2. Erosive changes along the posterior and superior aspect of the right external auditory canal consistent with aggressive otitis externa. 3. Erosive changes of the right scutum suggesting a chronic process. 4. The posterior right mastoid air cells are eroded without enhancement along the dura 5. Normal appearance of the left temporal bone. Electronically Signed   By: CSan MorelleM.D.   On: 04/09/2022 20:21   DG Chest Port 1 View  Result Date: 04/09/2022 CLINICAL DATA:  Shortness of breath EXAM: PORTABLE CHEST 1 VIEW COMPARISON:  03/21/2022 FINDINGS: Transverse diameter of the heart is increased. There are no signs of alveolar pulmonary  edema or new focal infiltrates. There is no pleural effusion or pneumothorax. There is possible skin fold overlying the lateral aspect of the right lower lung field. Surgical clips are seen in right chest wall. IMPRESSION: There are no signs of pulmonary edema or new focal infiltrates. Electronically Signed   By: Elmer Picker M.D.   On: 04/09/2022 20:06   CT Temporal Bones W Contrast  Result Date: 03/21/2022 CLINICAL DATA:  Mastoiditis EXAM: CT TEMPORAL BONES WITH CONTRAST TECHNIQUE: Axial and coronal plane CT imaging of the petrous temporal bones was performed with thin-collimation image reconstruction following intravenous contrast administration. Multiplanar CT image reconstructions were also generated. RADIATION DOSE REDUCTION: This exam was performed according to the departmental dose-optimization program which includes automated exposure control, adjustment of the mA and/or kV according to patient size and/or use of iterative reconstruction technique. CONTRAST:  50m OMNIPAQUE IOHEXOL 300 MG/ML  SOLN COMPARISON:  None Available. FINDINGS: RIGHT TEMPORAL BONE External auditory canal: Partially opacified Middle ear cavity: Completely opacified. Ossicles are normal. There is blunting of the scutum. Tegmen tympani is intact. Inner ear  structures: The cochlea, vestibule and semicircular canals are normal. The vestibular aqueduct is not enlarged. Internal auditory and facial nerve canals:  Normal Mastoid air cells: Complete opacification without coalescence LEFT TEMPORAL BONE External auditory canal: Partial opacification, possibly cerumen Middle ear cavity: Normally aerated. The scutum and ossicles are normal. The tegmen tympani is intact. Inner ear structures: The cochlea, vestibule and semicircular canals are normal. The vestibular aqueduct is not enlarged. Internal auditory and facial nerve canals:  Normal. Mastoid air cells: Normally aerated. No osseous erosion. Vascular: Normal non-contrast appearance of the carotid canals, jugular bulbs and sigmoid plates. Limited intracranial:  No acute or significant finding. Visible orbits/paranasal sinuses: No acute or significant finding. Soft tissues: Normal. IMPRESSION: 1. Complete opacification of the right middle ear cavity and mastoid air cells with blunting of the scutum, consistent with otomastoiditis. No osseous erosion. Electronically Signed   By: KUlyses JarredM.D.   On: 03/21/2022 19:29   DG Chest Portable 1 View  Result Date: 03/21/2022 CLINICAL DATA:  COPD. EXAM: PORTABLE CHEST 1 VIEW COMPARISON:  June 01, 2021 FINDINGS: The heart size and mediastinal contours are within normal limits. Both lungs are clear. The visualized skeletal structures are unremarkable. IMPRESSION: No active disease. Electronically Signed   By: DDorise BullionIII M.D.   On: 03/21/2022 16:43       Discharge Exam: Vitals:   04/11/22 2043 04/12/22 0948  BP: 118/72 (!) 113/56  Pulse: 76 75  Resp: 18 17  Temp: 98.6 F (37 C) 98.2 F (36.8 C)  SpO2: 98% 93%    General: Pt is alert, awake, not in acute distress Cardiovascular: RRR, S1/S2 +, no edema Respiratory: CTA bilaterally, no wheezing, no rhonchi, no respiratory distress, no conversational dyspnea  Abdominal: Soft, NT, ND, bowel sounds  + Extremities: no edema, no cyanosis Psych: Normal mood and affect, stable judgement and insight     The results of significant diagnostics from this hospitalization (including imaging, microbiology, ancillary and laboratory) are listed below for reference.     Microbiology: Recent Results (from the past 240 hour(s))  Surgical pcr screen     Status: None   Collection Time: 04/11/22  3:04 AM   Specimen: Nasal Mucosa; Nasal Swab  Result Value Ref Range Status   MRSA, PCR NEGATIVE NEGATIVE Final   Staphylococcus aureus NEGATIVE NEGATIVE Final    Comment: (NOTE) The Xpert SA Assay (FDA approved  for NASAL specimens in patients 67 years of age and older), is one component of a comprehensive surveillance program. It is not intended to diagnose infection nor to guide or monitor treatment. Performed at [facility], [place] [address], [place], [place] [address]      Labs: BNP (last 3 results) Recent Labs    06/18/21 0946 06/21/21 0531 06/22/21 0511  BNP 100.5* 112.7* 60.4   Basic Metabolic Panel: Recent Labs  Lab 04/09/22 1910 04/09/22 1919 04/10/22 0212 04/11/22 0102  NA 137 133* 135 134*  K 4.4 4.4 4.1 3.8  CL 94* 92* 89* 86*  CO2 35*  --  34* 37*  GLUCOSE 88 82 88 100*  BUN 9 7* 6* 10  CREATININE 0.67 0.90 0.74 0.81  CALCIUM 9.4  --  9.6 9.4   Liver Function Tests: Recent Labs  Lab 04/09/22 1910  AST 21  ALT 13  ALKPHOS 45  BILITOT 0.4  PROT 6.8  ALBUMIN 3.5   No results for input(s): "LIPASE", "AMYLASE" in the last 168 hours. No results for input(s): "AMMONIA" in the last 168 hours. CBC: Recent Labs  Lab 04/09/22 1910 04/09/22 1919 04/10/22 0553  WBC 5.3  --  5.4  NEUTROABS 3.6  --   --   HGB 10.9* 11.6* 10.4*  HCT 35.8* 34.0* 33.1*  MCV 92.7  --  90.9  PLT 246  --  228   Cardiac Enzymes: No results for input(s): "CKTOTAL", "CKMB", "CKMBINDEX", "TROPONINI" in the last 168 hours. BNP: Invalid input(s): "POCBNP" CBG: No results for  input(s): "GLUCAP" in the last 168 hours. D-Dimer No results for input(s): "DDIMER" in the last 72 hours. Hgb A1c No results for input(s): "HGBA1C" in the last 72 hours. Lipid Profile No results for input(s): "CHOL", "HDL", "LDLCALC", "TRIG", "CHOLHDL", "LDLDIRECT" in the last 72 hours. Thyroid function studies No results for input(s): "TSH", "T4TOTAL", "T3FREE", "THYROIDAB" in the last 72 hours.  Invalid input(s): "FREET3" Anemia work up No results for input(s): "VITAMINB12", "FOLATE", "FERRITIN", "TIBC", "IRON", "RETICCTPCT" in the last 72 hours. Urinalysis    Component Value Date/Time   COLORURINE Yellow 03/05/2022 1023   APPEARANCEUR Clear 03/05/2022 1023   LABSPEC < 1.005 (A) 03/05/2022 1023   PHURINE 5.5 03/05/2022 1023   GLUCOSEU Negative 03/05/2022 1023   HGBUR Negative 03/05/2022 1023   BILIRUBINUR Negative 03/05/2022 1023   KETONESUR Negative 03/05/2022 1023   PROTEINUR 100 (A) 12/23/2020 0055   UROBILINOGEN 0.2 mg/dL (A) 03/05/2022 1023   NITRITE Negative 03/05/2022 1023   LEUKOCYTESUR Negative 03/05/2022 1023   Sepsis Labs Recent Labs  Lab 04/09/22 1910 04/10/22 0553  WBC 5.3 5.4   Microbiology Recent Results (from the past 240 hour(s))  Surgical pcr screen     Status: None   Collection Time: 04/11/22  3:04 AM   Specimen: Nasal Mucosa; Nasal Swab  Result Value Ref Range Status   MRSA, PCR NEGATIVE NEGATIVE Final   Staphylococcus aureus NEGATIVE NEGATIVE Final    Comment: (NOTE) The Xpert SA Assay (FDA approved for NASAL specimens in patients 88 years of age and older), is one component of a comprehensive surveillance program. It is not intended to diagnose infection nor to guide or monitor treatment. Performed at [facility], [place] [address] [place], [place] [address]      Patient was seen and examined on the day of discharge and was found to be in stable condition. Time coordinating discharge: 25 minutes including assessment and coordination  of care,  as well as examination of the patient.   SIGNED:  Dessa Phi, DO [facility] 04/12/2022, 10:49 AM

## 2022-04-12 NOTE — TOC Transition Note (Signed)
Transition of Care [facility]) - CM/SW Discharge Note   Patient Details  Name: [name] MRN: [id number] Date of Birth: 06/20/1946  [facility]) CM/SW Contact:  Tom-Johnson, Renea Ee, RN Phone Number: 04/12/2022, 12:29 PM   Clinical Narrative:     Patient is scheduled for discharge today. Admitted for Mastoiditis. Had Rt Ear cleaning, removal of foreign bodies, and Tympanostomy tube of the right ear yesterday, 04/11/22.  From home with daughter and grandchildren. Independent with care prior to admission. Daughter transports to and from appointments. Has a cane and home O2 from [place].  PCP is [name], MD and uses [facility] on [address].  No TOC needs or recommendations noted. Denies any needs. Daughter to transport at discharge. No further TOC needs noted.   Final next level of care: Home/Self Care Barriers to Discharge: Barriers Resolved   Patient Goals and CMS Choice Patient states their goals for this hospitalization and ongoing recovery are:: To return home CMS Medicare.gov Compare Post Acute Care list provided to:: Patient Choice offered to / list presented to : NA  Discharge Placement                Patient to be transferred to facility by: Daughter      Discharge Plan and Services                DME Arranged: N/A DME Agency: NA       HH Arranged: NA [place] Agency: NA        Social Determinants of Health (SDOH) Interventions     Readmission Risk Interventions    06/22/2021   10:26 AM  Readmission Risk Prevention Plan  Transportation Screening Complete  PCP or Specialist Appt within 3-5 Days Complete  HRI or [place] Complete  Social Work Consult for [place] Planning/Counseling Complete  Palliative Care Screening Not Applicable  Medication Review [employer]) Complete

## 2022-04-12 NOTE — Progress Notes (Signed)
Patient ID: [name], female   DOB: 1946/07/18, 76 y.o.   MRN: [id number]  She is profoundly better today.  She has no pain.  She no longer has imbalance sensation and was actually able to get up out of bed.  The ear has some mild amount of drainage.  No bleeding.  No nystagmus.  Her hearing is significantly better.  With the removal of the tightly packed foreign bodies she should now resolve this situation is most likely that was the etiology.  I think Ciprodex for 2 weeks and a systemic antibiotics such as ciprofloxacin would be appropriate outpatient.  She can be discharged and I will follow her up in about 2-3 weeks.  Have her call [phone] for the follow-up and if she has any problems prior to that call [phone] to get a hold of me personally.

## 2022-04-12 NOTE — Care Management Important Message (Signed)
Important Message  Patient Details  Name: [name] MRN: [id number] Date of Birth: [birth year]   Medicare Important Message Given:  Yes     Orbie Pyo 04/12/2022, 3:41 PM

## 2022-04-15 ENCOUNTER — Telehealth: Payer: Self-pay | Admitting: Internal Medicine

## 2022-04-15 DIAGNOSIS — M810 Age-related osteoporosis without current pathological fracture: Secondary | ICD-10-CM

## 2022-04-15 NOTE — Telephone Encounter (Signed)
Pt daughter Mohammad called and stated her mom is due for a prolia injection on 05/19/22. She said her mom always gets it sent to [place]. Pt is requesting rx for prolia injection.    Please advise

## 2022-04-17 ENCOUNTER — Other Ambulatory Visit: Payer: Self-pay | Admitting: Internal Medicine

## 2022-04-17 DIAGNOSIS — K2100 Gastro-esophageal reflux disease with esophagitis, without bleeding: Secondary | ICD-10-CM

## 2022-04-19 ENCOUNTER — Ambulatory Visit (INDEPENDENT_AMBULATORY_CARE_PROVIDER_SITE_OTHER): Payer: Medicare Other | Admitting: Otolaryngology

## 2022-04-19 DIAGNOSIS — H6521 Chronic serous otitis media, right ear: Secondary | ICD-10-CM

## 2022-04-19 NOTE — Progress Notes (Signed)
[name] is an 76 y.o. female.   Chief Complaint: ear pain HPI: hx of mastoid issues and had tube placed about 2 weeks ago. She had some erosion on the Ct scan but had 2 foreign bodies in the canal that were removed in the OR. She is doing great. She has better hearing, no dizziness, and no pain. The ear is still draining.  Past Medical History:  Diagnosis Date   Abnormal LFTs 08/02/2011   Acute liver failure 06/19/2013   Acute renal failure ([place]) 06/19/2013   Acute respiratory failure ([place]) 06/19/2013   Altered mental status 08/01/2011   Angina    Anxiety    Anxiety state, unspecified 12/03/2013   Aortic stenosis    mild   Arthritis    "hands" (02/03/2017)   Breast cancer, right breast ([place]) dx'd 2008   CAD (coronary artery disease) of artery bypass graft 11/06/2013   CAD (coronary artery disease), MI R/O 08/01/2011   PCI in 2006 (bare metal stent, unknown artery) - [place]    CAP (community acquired pneumonia) 09/11/2018   CHF,  acute diastolic, BNP 4k on admissio 08/01/2011   Chronic bronchitis (H[facility])    Chronic respiratory failure ([place]) 02/02/2012   Compression fracture of L1 lumbar vertebra ([place]) 02/02/2012   COPD (chronic obstructive pulmonary disease) ([place])    oxygen-dependent 4LPM [place]   Coronary artery disease 2006   2 stents w/previous MI   Depressed    Diastolic CHF ([place])    Dyslipidemia    Family history of adverse reaction to anesthesia    "daughter had c-section; missed twice w/epidural"   GERD (gastroesophageal reflux disease)    Heart murmur    History of blood transfusion    "w/my colon OR"   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   History of nuclear stress test 08/03/2011   attenuation at apex - no perfusion defects    HTN (hypertension) 11/06/2013   Hyperkalemia, on ACE prior to admission 11/11/2011   Hypertension    Hyponatremia 01/31/2012   Migraine    "qod to q couple months since I was 21" (02/03/2017)   Mild aortic stenosis 08/01/2011   AVA 1.69 cm2 (06/02/2011)     Moderate to severe pulmonary hypertension ([place])    NSTEMI (non-ST elevated myocardial infarction) ([place]) 2006   NSVT (nonsustained ventricular tachycardia) ([place])    h/o   On home oxygen therapy    "3L just at night; have it available prn duringtheday" (09/11/2018)   Pneumonia    "alot of times" (02/03/2017)   SBO (small bowel obstruction) ([place]) 04/23/2020   PARTIAL     Past Surgical History:  Procedure Laterality Date   BREAST BIOPSY Right 2008   BREAST LUMPECTOMY Right 2008   malignant   CATARACT EXTRACTION W/ INTRAOCULAR LENS  IMPLANT, BILATERAL Bilateral ~ 2013   [place]; 1971; [place]  11/2007   COLOSTOMY CLOSURE  07/2008   CORONARY ANGIOPLASTY WITH STENT PLACEMENT  2006   "2 stents"   ERCP N/A 09/05/2015   Procedure: ENDOSCOPIC RETROGRADE CHOLANGIOPANCREATOGRAPHY (ERCP);  Surgeon: Ladene Artist, MD;  Location: [facility];  Service: Endoscopy;  Laterality: N/A;   MYRINGOTOMY WITH TUBE PLACEMENT Right 04/11/2022   Procedure: TYMPANOSTOMY TUBE RIGHT EAR, EAR CLEANING AND REMOVAL OF FOREIGN BODIES;  Surgeon: Melissa Montane, MD;  Location: [facility];  Service: ENT;  Laterality: Right;   PARTIAL COLECTOMY  2009  for obstruction: temporary ostomy, later reversed.    RIGHT HEART CATHETERIZATION N/A 09/05/2013   Procedure: RIGHT HEART CATH;  Surgeon: Jolaine Artist, MD;  Location[facility] C[facility];  Service: Cardiovascular;  Laterality: N/A;   TRANSTHORACIC ECHOCARDIOGRAM  11/12/2011   EF 21-19%, normal systolic function, grade 1 diastolic dysfunction; ventricular septal flattening (D-sign); mild AS; trace-mild MR; LA mildly dilated; RV mod dilated; RA mod dilated; severe pulm HTN; elevated CVP    Family History  Problem Relation Age of Onset   Alzheimer's disease Mother    Schizophrenia Sister    Breast cancer Sister        80s   Heart disease Sister    Diabetes Sister    Hyperlipidemia Brother    Cancer Neg Hx     Stroke Neg Hx    COPD Neg Hx    Depression Neg Hx    Drug abuse Neg Hx    Early death Neg Hx    Hypertension Neg Hx    Kidney disease Neg Hx    Social History:  reports that she has been smoking cigarettes. She has a 26.50 pack-year smoking history. She has never used smokeless tobacco. She reports current alcohol use of about 1.0 standard drink of alcohol per week. She reports that she does not use drugs.  Allergies:  Allergies  Allergen Reactions   Ceftriaxone Anaphylaxis and Other (See Comments)    *ROCEPHIN*  "Blow up like a balloon"   Hydroxyzine Shortness Of Breath and Other (See Comments)    Pt states med make her light headed, get sob sxs   Doxycycline Nausea And Vomiting   Lexapro [Escitalopram] Other (See Comments)    Pt states med make her dizzy    (Not in a hospital admission)   No results found for this or any previous visit (from the past 48 hour(s)). No results found.  There were no vitals taken for this visit.  PHYSICAL EXAM: Appearance _ awake alert with no distress.  Head- atraumatic and no obvious abnormalities Eyes- PERRL, EOMI, no conjunctiva injection or ecchymosis Ears-  Right- Pinna without inflammation or swelling. Canalhas the tube sitting on the TM and removed. The perforation is 2-3 mms with serous drainage. Ear suctioned of all debris and no evidence of cholesteatoma. No granulation tissue.   Left- Pinna without inflammation or swelling. canal was occluded and under microscope a  cotton tip was removed from the canal. The TM looked normal and no granualtion tissue.  Oc/OP- no lesions or excessive swelling. Mouth opening normal.  Hp/Larynx- normal voice and no airway issues. No swelling or lesions Neck- no mass or lesions. Normal movement.  Neuro- CNII-XII intact, no sensory deficits.  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 She has the tube extruded in the right ear  and removed. The perforation is still present. No cholesteatoma. Drainage not purulent. She will continue the ciprodex and follow up in 2-3 weeks sooner if worse. She had a left foreign body removed under microscope. Stop using qtips.   Melissa Montane 04/19/2022, 11:24 AM

## 2022-04-20 MED ORDER — DENOSUMAB 60 MG/ML ~~LOC~~ SOSY: 60.0000 mg | PREFILLED_SYRINGE | Freq: Once | SUBCUTANEOUS | 0 refills | Status: AC

## 2022-04-20 NOTE — Telephone Encounter (Signed)
Rx sent for prolia to pharmacy.   LVM for pt's daughter, Fyfe, informing her that it was sent.

## 2022-04-21 ENCOUNTER — Telehealth: Payer: Self-pay | Admitting: Internal Medicine

## 2022-04-21 ENCOUNTER — Ambulatory Visit: Payer: Medicare Other

## 2022-04-21 NOTE — Telephone Encounter (Signed)
Daughter would like clarification on when surgery will be scheduled and if patient needs to come in to the office to see Dr. Debara Pickett prior to the procedure.

## 2022-04-21 NOTE — Telephone Encounter (Signed)
Spoke to patient's daughter [name].She was calling to find out when mother will be having cardiac cath.Advised I will send message to Dr.Hilty's RN.

## 2022-04-22 ENCOUNTER — Ambulatory Visit (INDEPENDENT_AMBULATORY_CARE_PROVIDER_SITE_OTHER): Payer: Medicare Other

## 2022-04-22 VITALS — BP 118/60 | HR 81 | Temp 97.9°F | Ht 62.0 in | Wt 106.6 lb

## 2022-04-22 DIAGNOSIS — Z0000 Encounter for general adult medical examination without abnormal findings: Secondary | ICD-10-CM | POA: Diagnosis not present

## 2022-04-22 NOTE — Telephone Encounter (Signed)
Spoke with patient's daughter. Explained that aortic valve has been followed closely and echo is scheduled for a few weeks from now. Based on that, she may need additional work up of the valve but no heart cath, etc is on the schedule. She verbalized understanding.

## 2022-04-22 NOTE — Progress Notes (Signed)
Subjective:   [name] is a 76 y.o. female who presents for Medicare Annual (Subsequent) preventive examination.  Review of Systems     Cardiac Risk Factors include: advanced age (>87mn, >>39women);dyslipidemia     Objective:    Today's Vitals   04/22/22 1444  BP: 118/60  Pulse: 81  Temp: 97.9 F (36.6 C)  SpO2: 93%  Weight: 106 lb 9.6 oz (48.4 kg)  Height: '5\' 2"'$  (1.575 m)  PainSc: 0-No pain   Body mass index is 19.5 kg/m.     04/10/2022    2:03 AM 04/09/2022    6:22 PM 09/16/2021    3:58 PM 06/18/2021    8:50 AM 12/23/2020    7:42 AM 12/23/2020   12:23 AM 04/23/2020    5:45 PM  Advanced Directives  Does Patient Have a Medical Advance Directive? No No No No Yes Yes   Type of AMetallurgistPower of Attorney   Does patient want to make changes to medical advance directive?     No - Patient declined No - Patient declined   Copy of HGypsumin Chart?     No - copy requested No - copy requested   Would patient like information on creating a medical advance directive? No - Patient declined No - Patient declined No - Patient declined No - Patient declined No - Patient declined No - Patient declined No - Patient declined    Current Medications (verified) Outpatient Encounter Medications as of 04/22/2022  Medication Sig   ALPRAZolam (XANAX) 1 MG tablet TAKE 1 TABLET BY MOUTH 3 TIMES DAILY AS NEEDED FOR ANXIETY. (Patient taking differently: Take 1 mg by mouth 3 (three) times daily as needed for anxiety.)   ATROVENT HFA 17 MCG/ACT inhaler INHALE 2 PUFFS INTO THE LUNGS EVERY 4 HOURS AS NEEDED (Patient taking differently: Inhale 2 puffs into the lungs every 4 (four) hours as needed for wheezing.)   budesonide-formoterol (SYMBICORT) 160-4.5 MCG/ACT inhaler Inhale 2 puffs into the lungs 2 (two) times daily. (Patient not taking: Reported on 04/22/2022)   butalbital-acetaminophen-caffeine (FIORICET) 50-325-40 MG  tablet TAKE 1 TABLET BY MOUTH EVERY 4 HOURS AS NEEDED FOR HEADACHE. (Patient taking differently: Take 1 tablet by mouth every 4 (four) hours as needed for headache.)   cholecalciferol (VITAMIN D) 1000 units tablet Take 1,000 Units by mouth daily.   ciprofloxacin (CIPRO) 500 MG tablet Take 1 tablet (500 mg total) by mouth 2 (two) times daily for 14 days.   diclofenac Sodium (VOLTAREN) 1 % GEL APPLY 2 GRAMS TO AFFECTED AREA 4 TIMES A DAY (Patient taking differently: Apply 2 g topically 4 (four) times daily.)   Fluticasone-Umeclidin-Vilant (TRELEGY ELLIPTA) 100-62.5-25 MCG/ACT AEPB Inhale 1 puff into the lungs daily.   furosemide (LASIX) 40 MG tablet TAKE 1 TABLET BY MOUTH TWICE A DAY (Patient taking differently: Take 40 mg by mouth 2 (two) times daily.)   ipratropium-albuterol (DUONEB) 0.5-2.5 (3) MG/3ML SOLN INHALE 3 MLS INTO THE LUNGS EVERY 6 HOURS AS NEEDED (FOR FOR SHORTNESS OF BREATH /WHEEZE OR COUGH). (Patient taking differently: Inhale 3 mLs into the lungs every 6 (six) hours as needed (shortness of breath).)   KLOR-CON M20 20 MEQ tablet TAKE 1 TABLET BY MOUTH EVERY DAY (Patient taking differently: Take 20 mEq by mouth daily.)   lactulose, encephalopathy, (ENULOSE) 10 GM/15ML SOLN Take 45 mLs (30 g total) by mouth 3 (three) times daily as needed (constipation).  levocetirizine (XYZAL) 5 MG tablet TAKE 1 TABLET BY MOUTH EVERY DAY IN THE EVENING (Patient taking differently: Take 5 mg by mouth every evening.)   LINZESS 72 MCG capsule TAKE 1 CAPSULE BY MOUTH EVERY DAY BEFORE BREAKFAST (Patient taking differently: Take 72 mcg by mouth daily before breakfast.)   LIVALO 2 MG TABS TAKE 1 TABLET BY MOUTH EVERY DAY (Patient taking differently: Take 2 mg by mouth daily at 12 noon.)   mometasone-formoterol (DULERA) 200-5 MCG/ACT AERO Inhale 2 puffs into the lungs 2 (two) times daily.   montelukast (SINGULAIR) 10 MG tablet TAKE 1 TABLET BY MOUTH EVERYDAY AT BEDTIME (Patient taking differently: Take 10 mg by  mouth at bedtime.)   nortriptyline (PAMELOR) 25 MG capsule TAKE 2 CAPSULES BY MOUTH AT BEDTIME (Patient taking differently: Take 50 mg by mouth at bedtime.)   ofloxacin (FLOXIN) 0.3 % OTIC solution Place 5 drops into the right ear daily for 14 days.   Omega 3 1200 MG CAPS Take 1,200 mg by mouth every morning.    OXYGEN-HELIUM IN Inhale 3 L into the lungs See admin instructions. Uses when needed during the day, uses continuous throughout the night   pantoprazole (PROTONIX) 40 MG tablet TAKE 1 TABLET BY MOUTH EVERY DAY   polyvinyl alcohol (LIQUIFILM TEARS) 1.4 % ophthalmic solution Place 1 drop into both eyes as needed for dry eyes.   VENTOLIN HFA 108 (90 Base) MCG/ACT inhaler INHALE 1-2 PUFFS BY MOUTH EVERY 6 HOURS AS NEEDED FOR WHEEZE OR SHORTNESS OF BREATH (Patient taking differently: Inhale 1-2 puffs into the lungs every 6 (six) hours as needed for wheezing or shortness of breath.)   vitamin B-12 (CYANOCOBALAMIN) 1000 MCG tablet Take 1 tablet (1,000 mcg total) by mouth daily.   vitamin C (ASCORBIC ACID) 500 MG tablet Take 500 mg by mouth daily.   No facility-administered encounter medications on file as of 04/22/2022.    Allergies (verified) Ceftriaxone, Hydroxyzine, Doxycycline, and Lexapro [escitalopram]   History: Past Medical History:  Diagnosis Date   Abnormal LFTs 08/02/2011   Acute liver failure 06/19/2013   Acute renal failure ([place]) 06/19/2013   Acute respiratory failure ([place]) 06/19/2013   Altered mental status 08/01/2011   Angina    Anxiety    Anxiety state, unspecified 12/03/2013   Aortic stenosis    mild   Arthritis    "hands" (02/03/2017)   Breast cancer, right breast ([place]) dx'd 2008   CAD (coronary artery disease) of artery bypass graft 11/06/2013   CAD (coronary artery disease), MI R/O 08/01/2011   PCI in 2006 (bare metal stent, unknown artery) - [place]    CAP (community acquired pneumonia) 09/11/2018   CHF,  acute diastolic, BNP 4k on admissio 08/01/2011   Chronic bronchitis  (HCC)    Chronic respiratory failure ([place]) 02/02/2012   Compression fracture of L1 lumbar vertebra ([place]) 02/02/2012   COPD (chronic obstructive pulmonary disease) ([place])    oxygen-dependent 4LPM [place]   Coronary artery disease 2006   2 stents w/previous MI   Depressed    Diastolic CHF ([place])    Dyslipidemia    Family history of adverse reaction to anesthesia    "daughter had c-section; missed twice w/epidural"   GERD (gastroesophageal reflux disease)    Heart murmur    History of blood transfusion    "w/my colon OR"   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   History of nuclear stress test 08/03/2011   attenuation at apex - no perfusion defects    HTN (  hypertension) 11/06/2013   Hyperkalemia, on ACE prior to admission 11/11/2011   Hypertension    Hyponatremia 01/31/2012   Migraine    "qod to q couple months since I was 21" (02/03/2017)   Mild aortic stenosis 08/01/2011   AVA 1.69 cm2 (06/02/2011)    Moderate to severe pulmonary hypertension (HCC)    NSTEMI (non-ST elevated myocardial infarction) ([place]) 2006   NSVT (nonsustained ventricular tachycardia) ([place])    h/o   On home oxygen therapy    "3L just at night; have it available prn duringtheday" (09/11/2018)   Pneumonia    "alot of times" (02/03/2017)   SBO (small bowel obstruction) ([place]) 04/23/2020   PARTIAL    Past Surgical History:  Procedure Laterality Date   BREAST BIOPSY Right 2008   BREAST LUMPECTOMY Right 2008   malignant   CATARACT EXTRACTION W/ INTRAOCULAR LENS  IMPLANT, BILATERAL Bilateral ~ 2013   [place]; 1971; [place]  11/2007   COLOSTOMY CLOSURE  07/2008   CORONARY ANGIOPLASTY WITH STENT PLACEMENT  2006   "2 stents"   ERCP N/A 09/05/2015   Procedure: ENDOSCOPIC RETROGRADE CHOLANGIOPANCREATOGRAPHY (ERCP);  Surgeon: Ladene Artist, MD;  Location: [facility];  Service: Endoscopy;  Laterality: N/A;   MYRINGOTOMY WITH TUBE PLACEMENT Right 04/11/2022    Procedure: TYMPANOSTOMY TUBE RIGHT EAR, EAR CLEANING AND REMOVAL OF FOREIGN BODIES;  Surgeon: Melissa Montane, MD;  Location: [facility];  Service: ENT;  Laterality: Right;   PARTIAL COLECTOMY  2009   for obstruction: temporary ostomy, later reversed.    RIGHT HEART CATHETERIZATION N/A 09/05/2013   Procedure: RIGHT HEART CATH;  Surgeon: Jolaine Artist, MD;  [facility] C[facility];  Service: Cardiovascular;  Laterality: N/A;   TRANSTHORACIC ECHOCARDIOGRAM  11/12/2011   EF 16-10%, normal systolic function, grade 1 diastolic dysfunction; ventricular septal flattening (D-sign); mild AS; trace-mild MR; LA mildly dilated; RV mod dilated; RA mod dilated; severe pulm HTN; elevated CVP   Family History  Problem Relation Age of Onset   Alzheimer's disease Mother    Schizophrenia Sister    Breast cancer Sister        8s   Heart disease Sister    Diabetes Sister    Hyperlipidemia Brother    Cancer Neg Hx    Stroke Neg Hx    COPD Neg Hx    Depression Neg Hx    Drug abuse Neg Hx    Early death Neg Hx    Hypertension Neg Hx    Kidney disease Neg Hx    Social History   Socioeconomic History   Marital status: Widowed    Spouse name: Not on file   Number of children: 3   Years of education: 34   Highest education level: Not on file  Occupational History   Not on file  Tobacco Use   Smoking status: Some Days    Packs/day: 0.50    Years: 53.00    Total pack years: 26.50    Types: Cigarettes   Smokeless tobacco: Never  Vaping Use   Vaping Use: Never used  Substance and Sexual Activity   Alcohol use: Yes    Alcohol/week: 1.0 standard drink of alcohol    Types: 1 Glasses of wine per week   Drug use: No   Sexual activity: Not Currently  Other Topics Concern   Not on file  Social History Narrative   Not  on file   Social Determinants of Health   Financial Resource Strain: Low Risk  (04/22/2022)   Overall Financial Resource Strain (CARDIA)    Difficulty of Paying Living Expenses: Not hard  at all  Food Insecurity: No Food Insecurity (04/22/2022)   Hunger Vital Sign    Worried About Running Out of Food in the Last Year: Never true    Ran Out of Food in the Last Year: Never true  Transportation Needs: No Transportation Needs (04/22/2022)   PRAPARE - Hydrologist (Medical): No    Lack of Transportation (Non-Medical): No  Physical Activity: Sufficiently Active (04/22/2022)   Exercise Vital Sign    Days of Exercise per Week: 5 days    Minutes of Exercise per Session: 30 min  Stress: No Stress Concern Present (04/22/2022)   [place]    Feeling of Stress : Not at all  Social Connections: Moderately Integrated (04/22/2022)   Social Connection and Isolation Panel [NHANES]    Frequency of Communication with Friends and Family: More than three times a week    Frequency of Social Gatherings with Friends and Family: More than three times a week    Attends Religious Services: 1 to 4 times per year    Active Member of [employer] or Organizations: No    Attends Archivist Meetings: 1 to 4 times per year    Marital Status: Widowed    Tobacco Counseling Ready to quit: Not Answered Counseling given: Not Answered   Clinical Intake:  Pre-visit preparation completed: Yes  Pain : No/denies pain Pain Score: 0-No pain     BMI - recorded: 19.5 Nutritional Status: BMI of 19-24  Normal Nutritional Risks: None Diabetes: No  How often do you need to have someone help you when you read instructions, pamphlets, or other written materials from your doctor or pharmacy?: 1 - Never What is the last grade level you completed in school?: HSG  Diabetic? no  Interpreter Needed?: No  Information entered by :: Lisette Abu, LPN.   Activities of Daily Living    04/22/2022    3:09 PM 04/10/2022    2:05 AM  In your present state of health, do you have any difficulty performing the following  activities:  Hearing? 0 0  Vision? 0 0  Difficulty concentrating or making decisions? 0 0  Walking or climbing stairs? 1 1  Dressing or bathing? 0 0  Doing errands, shopping? 1 1  Preparing Food and eating ? N   Using the Toilet? N   In the past six months, have you accidently leaked urine? Y   Do you have problems with loss of bowel control? N   Managing your Medications? N   Managing your Finances? N   Housekeeping or managing your Housekeeping? Y     Patient Care Team: [name], MD as PCP - General (Internal Medicine) Debara Pickett Nadean Corwin, MD as PCP - Cardiology (Cardiology) Debara Pickett Nadean Corwin, MD as Consulting Physician (Cardiology) Juanito Doom, MD as Consulting Physician (Pulmonary Disease)  Indicate any recent Medical Services you may have received from other than [facility] providers in the past year (date may be approximate).     Assessment:   This is a routine wellness examination for [place].  Hearing/Vision screen Hearing Screening - Comments:: Patient denied any hearing difficulty.   No hearing aids.  Vision Screening - Comments:: Patient does wear readers.   Dietary  issues and exercise activities discussed: Current Exercise Habits: Home exercise routine, Type of exercise: walking, Time (Minutes): 30, Frequency (Times/Week): 5, Weekly Exercise (Minutes/Week): 150, Intensity: Mild, Exercise limited by: respiratory conditions(s);cardiac condition(s);orthopedic condition(s)   Goals Addressed             This Visit's Progress    Stay independent and active.        Depression Screen    04/22/2022    3:07 PM 08/05/2021    2:00 PM 10/07/2020    1:16 PM 03/19/2019    3:11 PM 01/24/2018   11:44 AM 12/27/2017   10:14 AM 12/21/2016    2:16 PM  PHQ 2/9 Scores  PHQ - 2 Score 0 0 0 0 0 0 0  PHQ- 9 Score    '3 1 1     '$ Fall Risk    04/22/2022    3:09 PM 08/05/2021    2:00 PM 05/06/2020    4:01 PM 03/19/2019    3:11 PM 01/24/2018   11:44 AM  [place] in the  past year? 0 0 0 0 No  Number falls in past yr: 0 0 0 0   Injury with Fall? 0 0 0 0   Comment  N/A- no falls reported     Risk for fall due to : Impaired balance/gait Medication side effect;Other (Comment) Other (Comment) Impaired balance/gait;Impaired mobility   Risk for fall due to: Comment  home use of oxygen Respiratory Patient    Follow up Falls evaluation completed Falls prevention discussed Falls evaluation completed Falls evaluation completed     [place]:  Any stairs in or around the home? Yes  If so, are there any without handrails? No  Home free of loose throw rugs in walkways, pet beds, electrical cords, etc? Yes  Adequate lighting in your home to reduce risk of falls? Yes   ASSISTIVE DEVICES UTILIZED TO PREVENT FALLS:  Life alert? No  Use of a cane, walker or w/c? Yes  Grab bars in the bathroom? No  Shower chair or bench in shower? No  Elevated toilet seat or a handicapped toilet? No   TIMED UP AND GO:  Was the test performed? Yes .  Length of time to ambulate 10 feet: 8 sec.   Gait steady and fast with assistive device  Cognitive Function:        04/22/2022    3:10 PM  6CIT Screen  What Year? 0 points  What month? 0 points  What time? 0 points  Count back from 20 0 points  Months in reverse 0 points  Repeat phrase 0 points  Total Score 0 points    Immunizations Immunization History  Administered Date(s) Administered   Fluad Quad(high Dose 65+) 07/01/2021   Influenza, High Dose Seasonal PF 06/19/2015, 06/05/2017, 05/23/2019, 06/04/2020   Influenza,inj,Quad PF,6+ Mos 06/22/2013, 07/09/2014   Influenza,trivalent, recombinat, inj, PF 05/22/2018   Influenza-Unspecified 05/25/2019, 06/06/2020   PFIZER(Purple Top)SARS-COV-2 Vaccination 10/20/2019, 11/10/2019   Pneumococcal Conjugate-13 10/03/2013   Pneumococcal Polysaccharide-23 04/10/2014, 05/25/2019   Tdap 03/04/2014, 09/30/2021    TDAP status: Up to date  Flu  Vaccine status: Up to date  Pneumococcal vaccine status: Up to date  Covid-19 vaccine status: Completed vaccines  Qualifies for Shingles Vaccine? Yes   Zostavax completed No   Shingrix Completed?: No.    Education has been provided regarding the importance of this vaccine. Patient has been advised to call insurance company to determine  out of pocket expense if they have not yet received this vaccine. Advised may also receive vaccine at local pharmacy or Health Dept. Verbalized acceptance and understanding.  Screening Tests Health Maintenance  Topic Date Due   Zoster Vaccines- Shingrix (1 of 2) Never done   COVID-19 Vaccine (3 - Pfizer risk series) 12/08/2019   INFLUENZA VACCINE  04/27/2022   TETANUS/TDAP  10/01/2031   Pneumonia Vaccine 21+ Years old  Completed   DEXA SCAN  Completed   Hepatitis C Screening  Completed   HPV VACCINES  Aged Out   COLONOSCOPY (Pts 45-51yr Insurance coverage will need to be confirmed)  Discontinued    Health Maintenance  Health Maintenance Due  Topic Date Due   Zoster Vaccines- Shingrix (1 of 2) Never done   COVID-19 Vaccine (3 - Pfizer risk series) 12/08/2019    Colorectal cancer screening: No longer required.   Mammogram status: Completed 04/29/2021. Repeat every year  Bone Density status: Completed 10/22/2015. Results reflect: Bone density results: OSTEOPOROSIS. Repeat every 2 years.  Lung Cancer Screening: (Low Dose CT Chest recommended if Age 76-80years, 30 pack-year currently smoking OR have quit w/in 15years.) does not qualify.   Lung Cancer Screening Referral: no  Additional Screening:  Hepatitis C Screening: does qualify; Completed 06/22/2013  Vision Screening: Recommended annual ophthalmology exams for early detection of glaucoma and other disorders of the eye. Is the patient up to date with their annual eye exam?  No  Who is the provider or what is the name of the office in which the patient attends annual eye exams? Looking for  new eye doctor If pt is not established with a provider, would they like to be referred to a provider to establish care? No .   Dental Screening: Recommended annual dental exams for proper oral hygiene  Community Resource Referral / Chronic Care Management: CRR required this visit?  No   CCM required this visit?  No      Plan:     I have personally reviewed and noted the following in the patient's chart:   Medical and social history Use of alcohol, tobacco or illicit drugs  Current medications and supplements including opioid prescriptions.  Functional ability and status Nutritional status Physical activity Advanced directives List of other physicians Hospitalizations, surgeries, and ER visits in previous 12 months Vitals Screenings to include cognitive, depression, and falls Referrals and appointments  In addition, I have reviewed and discussed with patient certain preventive protocols, quality metrics, and best practice recommendations. A written personalized care plan for preventive services as well as general preventive health recommendations were provided to patient.     SSheral Flow LPN   [id number]  Nurse Notes: none

## 2022-04-22 NOTE — Patient Instructions (Signed)
[name] , Thank you for taking time to come for your Medicare Wellness Visit. I appreciate your ongoing commitment to your health goals. Please review the following plan we discussed and let me know if I can assist you in the future.   Screening recommendations/referrals: Colonoscopy: Discontinued Mammogram: 04/29/2021; due every year Bone Density: 10/21/2013; handled by endocrinology Recommended yearly ophthalmology/optometry visit for glaucoma screening and checkup Recommended yearly dental visit for hygiene and checkup  Vaccinations: Influenza vaccine: 07/01/2021 Pneumococcal vaccine: 10/03/2013, 05/25/2019 Tdap vaccine: 09/30/2021; due every 10 years Shingles vaccine: never done   Covid-19: 10/20/2019, 11/10/2019  Advanced directives: Yes (daughter aware of wishes)  Conditions/risks identified: Yes  Next appointment: Please schedule your next Medicare Wellness Visit with your Nurse Health Advisor in 1 year by calling [phone].   Preventive Care 21 Years and Older, Female Preventive care refers to lifestyle choices and visits with your health care provider that can promote health and wellness. What does preventive care include? A yearly physical exam. This is also called an annual well check. Dental exams once or twice a year. Routine eye exams. Ask your health care provider how often you should have your eyes checked. Personal lifestyle choices, including: Daily care of your teeth and gums. Regular physical activity. Eating a healthy diet. Avoiding tobacco and drug use. Limiting alcohol use. Practicing safe sex. Taking low-dose aspirin every day. Taking vitamin and mineral supplements as recommended by your health care provider. What happens during an annual well check? The services and screenings done by your health care provider during your annual well check will depend on your age, overall health, lifestyle risk factors, and family history of disease. Counseling  Your  health care provider may ask you questions about your: Alcohol use. Tobacco use. Drug use. Emotional well-being. Home and relationship well-being. Sexual activity. Eating habits. History of falls. Memory and ability to understand (cognition). Work and work Statistician. Reproductive health. Screening  You may have the following tests or measurements: Height, weight, and BMI. Blood pressure. Lipid and cholesterol levels. These may be checked every 5 years, or more frequently if you are over 40 years old. Skin check. Lung cancer screening. You may have this screening every year starting at age 65 if you have a 30-pack-year history of smoking and currently smoke or have quit within the past 15 years. Fecal occult blood test (FOBT) of the stool. You may have this test every year starting at age 61. Flexible sigmoidoscopy or colonoscopy. You may have a sigmoidoscopy every 5 years or a colonoscopy every 10 years starting at age 57. Hepatitis C blood test. Hepatitis B blood test. Sexually transmitted disease (STD) testing. Diabetes screening. This is done by checking your blood sugar (glucose) after you have not eaten for a while (fasting). You may have this done every 1-3 years. Bone density scan. This is done to screen for osteoporosis. You may have this done starting at age 49. Mammogram. This may be done every 1-2 years. Talk to your health care provider about how often you should have regular mammograms. Talk with your health care provider about your test results, treatment options, and if necessary, the need for more tests. Vaccines  Your health care provider may recommend certain vaccines, such as: Influenza vaccine. This is recommended every year. Tetanus, diphtheria, and acellular pertussis (Tdap, Td) vaccine. You may need a Td booster every 10 years. Zoster vaccine. You may need this after age 49. Pneumococcal 13-valent conjugate (PCV13) vaccine. One dose is recommended after  age  22. Pneumococcal polysaccharide (PPSV23) vaccine. One dose is recommended after age 70. Talk to your health care provider about which screenings and vaccines you need and how often you need them. This information is not intended to replace advice given to you by your health care provider. Make sure you discuss any questions you have with your health care provider. Document Released: 10/10/2015 Document Revised: 06/02/2016 Document Reviewed: 07/15/2015 Elsevier Interactive Patient Education  2017 [place] Prevention in the Home Falls can cause injuries. They can happen to people of all ages. There are many things you can do to make your home safe and to help prevent falls. What can I do on the outside of my home? Regularly fix the edges of walkways and driveways and fix any cracks. Remove anything that might make you trip as you walk through a door, such as a raised step or threshold. Trim any bushes or trees on the path to your home. Use bright outdoor lighting. Clear any walking paths of anything that might make someone trip, such as rocks or tools. Regularly check to see if handrails are loose or broken. Make sure that both sides of any steps have handrails. Any raised decks and porches should have guardrails on the edges. Have any leaves, snow, or ice cleared regularly. Use sand or salt on walking paths during winter. Clean up any spills in your garage right away. This includes oil or grease spills. What can I do in the bathroom? Use night lights. Install grab bars by the toilet and in the tub and shower. Do not use towel bars as grab bars. Use non-skid mats or decals in the tub or shower. If you need to sit down in the shower, use a plastic, non-slip stool. Keep the floor dry. Clean up any water that spills on the floor as soon as it happens. Remove soap buildup in the tub or shower regularly. Attach bath mats securely with double-sided non-slip rug tape. Do not have throw  rugs and other things on the floor that can make you trip. What can I do in the bedroom? Use night lights. Make sure that you have a light by your bed that is easy to reach. Do not use any sheets or blankets that are too big for your bed. They should not hang down onto the floor. Have a firm chair that has side arms. You can use this for support while you get dressed. Do not have throw rugs and other things on the floor that can make you trip. What can I do in the kitchen? Clean up any spills right away. Avoid walking on wet floors. Keep items that you use a lot in easy-to-reach places. If you need to reach something above you, use a strong step stool that has a grab bar. Keep electrical cords out of the way. Do not use floor polish or wax that makes floors slippery. If you must use wax, use non-skid floor wax. Do not have throw rugs and other things on the floor that can make you trip. What can I do with my stairs? Do not leave any items on the stairs. Make sure that there are handrails on both sides of the stairs and use them. Fix handrails that are broken or loose. Make sure that handrails are as long as the stairways. Check any carpeting to make sure that it is firmly attached to the stairs. Fix any carpet that is loose or worn. Avoid having throw rugs  at the top or bottom of the stairs. If you do have throw rugs, attach them to the floor with carpet tape. Make sure that you have a light switch at the top of the stairs and the bottom of the stairs. If you do not have them, ask someone to add them for you. What else can I do to help prevent falls? Wear shoes that: Do not have high heels. Have rubber bottoms. Are comfortable and fit you well. Are closed at the toe. Do not wear sandals. If you use a stepladder: Make sure that it is fully opened. Do not climb a closed stepladder. Make sure that both sides of the stepladder are locked into place. Ask someone to hold it for you, if  possible. Clearly mark and make sure that you can see: Any grab bars or handrails. First and last steps. Where the edge of each step is. Use tools that help you move around (mobility aids) if they are needed. These include: Canes. Walkers. Scooters. Crutches. Turn on the lights when you go into a dark area. Replace any light bulbs as soon as they burn out. Set up your furniture so you have a clear path. Avoid moving your furniture around. If any of your floors are uneven, fix them. If there are any pets around you, be aware of where they are. Review your medicines with your doctor. Some medicines can make you feel dizzy. This can increase your chance of falling. Ask your doctor what other things that you can do to help prevent falls. This information is not intended to replace advice given to you by your health care provider. Make sure you discuss any questions you have with your health care provider. Document Released: 07/10/2009 Document Revised: 02/19/2016 Document Reviewed: 10/18/2014 Elsevier Interactive Patient Education  2017 [employer].

## 2022-04-26 ENCOUNTER — Other Ambulatory Visit: Payer: Self-pay

## 2022-04-26 ENCOUNTER — Encounter (HOSPITAL_COMMUNITY): Payer: Self-pay | Admitting: Emergency Medicine

## 2022-04-26 ENCOUNTER — Observation Stay (HOSPITAL_COMMUNITY)
Admission: EM | Admit: 2022-04-26 | Discharge: 2022-04-28 | Disposition: A | Discharge status: Home / Self Care | Payer: Medicare Other | Attending: Student | Admitting: Student

## 2022-04-26 DIAGNOSIS — E785 Hyperlipidemia, unspecified: Secondary | ICD-10-CM | POA: Diagnosis not present

## 2022-04-26 DIAGNOSIS — K5900 Constipation, unspecified: Principal | ICD-10-CM | POA: Insufficient documentation

## 2022-04-26 DIAGNOSIS — I110 Hypertensive heart disease with heart failure: Secondary | ICD-10-CM | POA: Diagnosis not present

## 2022-04-26 DIAGNOSIS — A310 Pulmonary mycobacterial infection: Secondary | ICD-10-CM

## 2022-04-26 DIAGNOSIS — J9611 Chronic respiratory failure with hypoxia: Secondary | ICD-10-CM | POA: Diagnosis present

## 2022-04-26 DIAGNOSIS — I10 Essential (primary) hypertension: Secondary | ICD-10-CM | POA: Diagnosis present

## 2022-04-26 DIAGNOSIS — J9601 Acute respiratory failure with hypoxia: Secondary | ICD-10-CM | POA: Insufficient documentation

## 2022-04-26 DIAGNOSIS — D649 Anemia, unspecified: Secondary | ICD-10-CM | POA: Diagnosis present

## 2022-04-26 DIAGNOSIS — I700 Atherosclerosis of aorta: Secondary | ICD-10-CM | POA: Insufficient documentation

## 2022-04-26 DIAGNOSIS — Z79899 Other long term (current) drug therapy: Secondary | ICD-10-CM | POA: Diagnosis not present

## 2022-04-26 DIAGNOSIS — R9389 Abnormal findings on diagnostic imaging of other specified body structures: Secondary | ICD-10-CM | POA: Diagnosis present

## 2022-04-26 DIAGNOSIS — I5032 Chronic diastolic (congestive) heart failure: Secondary | ICD-10-CM | POA: Insufficient documentation

## 2022-04-26 DIAGNOSIS — K5904 Chronic idiopathic constipation: Secondary | ICD-10-CM | POA: Diagnosis present

## 2022-04-26 LAB — CBC WITH DIFFERENTIAL/PLATELET
Abs Immature Granulocytes: 0.01 10*3/uL (ref 0.00–0.07)
Basophils Absolute: 0.1 10*3/uL (ref 0.0–0.1)
Basophils Relative: 1 %
Eosinophils Absolute: 0 10*3/uL (ref 0.0–0.5)
Eosinophils Relative: 0 %
HCT: 36.1 % (ref 36.0–46.0)
Hemoglobin: 10.9 g/dL — ABNORMAL LOW (ref 12.0–15.0)
Immature Granulocytes: 0 %
Lymphocytes Relative: 13 %
Lymphs Abs: 0.9 10*3/uL (ref 0.7–4.0)
MCH: 27.7 pg (ref 26.0–34.0)
MCHC: 30.2 g/dL (ref 30.0–36.0)
MCV: 91.9 fL (ref 80.0–100.0)
Monocytes Absolute: 0.5 10*3/uL (ref 0.1–1.0)
Monocytes Relative: 7 %
Neutro Abs: 6 10*3/uL (ref 1.7–7.7)
Neutrophils Relative %: 79 %
Platelets: 334 10*3/uL (ref 150–400)
RBC: 3.93 MIL/uL (ref 3.87–5.11)
RDW: 15.1 % (ref 11.5–15.5)
WBC: 7.6 10*3/uL (ref 4.0–10.5)
nRBC: 0 % (ref 0.0–0.2)

## 2022-04-26 LAB — COMPREHENSIVE METABOLIC PANEL
ALT: 14 U/L (ref 0–44)
AST: 23 U/L (ref 15–41)
Albumin: 3.8 g/dL (ref 3.5–5.0)
Alkaline Phosphatase: 48 U/L (ref 38–126)
Anion gap: 13 (ref 5–15)
BUN: 18 mg/dL (ref 8–23)
CO2: 35 mmol/L — ABNORMAL HIGH (ref 22–32)
Calcium: 10.3 mg/dL (ref 8.9–10.3)
Chloride: 87 mmol/L — ABNORMAL LOW (ref 98–111)
Creatinine, Ser: 0.94 mg/dL (ref 0.44–1.00)
GFR, Estimated: >60 mL/min (ref >60)
Glucose, Bld: 92 mg/dL (ref 70–99)
Potassium: 4.2 mmol/L (ref 3.5–5.1)
Sodium: 135 mmol/L (ref 135–145)
Total Bilirubin: 0.5 mg/dL (ref 0.3–1.2)
Total Protein: 7.5 g/dL (ref 6.5–8.1)

## 2022-04-26 LAB — LIPASE, BLOOD: Lipase: 40 U/L (ref 11–51)

## 2022-04-26 LAB — URINALYSIS, ROUTINE W REFLEX MICROSCOPIC
Bilirubin Urine: NEGATIVE
Glucose, UA: NEGATIVE mg/dL
Hgb urine dipstick: NEGATIVE
Ketones, ur: NEGATIVE mg/dL
Leukocytes,Ua: NEGATIVE
Nitrite: NEGATIVE
Protein, ur: NEGATIVE mg/dL
Specific Gravity, Urine: 1.011 (ref 1.005–1.030)
pH: 7 (ref 5.0–8.0)

## 2022-04-26 NOTE — ED Triage Notes (Signed)
Patient BIB [facility] c/o constipation x 6 days.  Patient has a history of recent antibiotic use with constipation starting after finished.  Patient has had no relief with OTC meds.  118/76 76 HR 18 RR 92% on 2L [place] CBG 106

## 2022-04-26 NOTE — ED Provider Triage Note (Signed)
Emergency Medicine Provider Triage Evaluation Note  [name] , a 76 y.o. female  was evaluated in triage.  Pt complains of constipation for 6 days.  She was on antibiotics and since then have been constipated.   No diarrhea on the antibiotics.   She is having abdominal pain.  Physical Exam  BP (!) 149/76 (BP Location: Left Arm)   Pulse 77   Temp 97.7 F (36.5 C) (Oral)   Resp 16   SpO2 98%  Gen:   Awake, no distress   Resp:  Normal effort  MSK:   Moves extremities without difficulty  Other:  Abdominal pain.   Medical Decision Making  Medically screening exam initiated at 8:55 PM.  Appropriate orders placed.  [name] was informed that the remainder of the evaluation will be completed by another provider, this initial triage assessment does not replace that evaluation, and the importance of remaining in the ED until their evaluation is complete.     Lorin Glass, [place] 04/26/22 2058

## 2022-04-27 ENCOUNTER — Emergency Department (HOSPITAL_COMMUNITY): Payer: Medicare Other

## 2022-04-27 ENCOUNTER — Encounter (HOSPITAL_COMMUNITY): Payer: Self-pay | Admitting: Emergency Medicine

## 2022-04-27 ENCOUNTER — Inpatient Hospital Stay (HOSPITAL_COMMUNITY): Payer: Medicare Other

## 2022-04-27 DIAGNOSIS — K5900 Constipation, unspecified: Principal | ICD-10-CM | POA: Diagnosis present

## 2022-04-27 DIAGNOSIS — D649 Anemia, unspecified: Secondary | ICD-10-CM | POA: Diagnosis present

## 2022-04-27 DIAGNOSIS — R9389 Abnormal findings on diagnostic imaging of other specified body structures: Secondary | ICD-10-CM | POA: Diagnosis present

## 2022-04-27 LAB — EXPECTORATED SPUTUM ASSESSMENT W GRAM STAIN, RFLX TO RESP C

## 2022-04-27 LAB — STREP PNEUMONIAE URINARY ANTIGEN: Strep Pneumo Urinary Antigen: NEGATIVE

## 2022-04-27 MED ORDER — MILK AND MOLASSES ENEMA
1.0000 | Freq: Once | RECTAL | Status: AC
  Administered 2022-04-27: 240 mL via RECTAL
  Filled 2022-04-27: qty 240

## 2022-04-27 MED ORDER — POLYVINYL ALCOHOL 1.4 % OP SOLN
1.0000 [drp] | Freq: Three times a day (TID) | OPHTHALMIC | Status: DC | PRN
Start: 2022-04-27 — End: 2022-04-28

## 2022-04-27 MED ORDER — IPRATROPIUM-ALBUTEROL 0.5-2.5 (3) MG/3ML IN SOLN: 3.0000 mL | Freq: Four times a day (QID) | RESPIRATORY_TRACT | Status: DC | PRN

## 2022-04-27 MED ORDER — LINACLOTIDE 72 MCG PO CAPS
72.0000 ug | ORAL_CAPSULE | Freq: Every day | ORAL | Status: DC
  Administered 2022-04-28: 72 ug via ORAL
  Filled 2022-04-27: qty 1

## 2022-04-27 MED ORDER — SODIUM CHLORIDE 0.9% FLUSH
3.0000 mL | Freq: Two times a day (BID) | INTRAVENOUS | Status: DC
  Administered 2022-04-27 (×2): 3 mL via INTRAVENOUS

## 2022-04-27 MED ORDER — ASPIRIN 325 MG PO TBEC
325.0000 mg | DELAYED_RELEASE_TABLET | Freq: Every day | ORAL | Status: DC
  Administered 2022-04-28: 325 mg via ORAL
  Filled 2022-04-27: qty 1

## 2022-04-27 MED ORDER — MONTELUKAST SODIUM 10 MG PO TABS
10.0000 mg | ORAL_TABLET | Freq: Every day | ORAL | Status: DC
  Administered 2022-04-27: 10 mg via ORAL
  Filled 2022-04-27: qty 1

## 2022-04-27 MED ORDER — SODIUM CHLORIDE 0.9 % IV BOLUS
250.0000 mL | Freq: Once | INTRAVENOUS | Status: AC
  Administered 2022-04-27: 250 mL via INTRAVENOUS

## 2022-04-27 MED ORDER — IOHEXOL 300 MG/ML  SOLN
80.0000 mL | Freq: Once | INTRAMUSCULAR | Status: AC | PRN
  Administered 2022-04-27: 80 mL via INTRAVENOUS

## 2022-04-27 MED ORDER — FUROSEMIDE 40 MG PO TABS
40.0000 mg | ORAL_TABLET | Freq: Two times a day (BID) | ORAL | Status: DC
  Administered 2022-04-28: 40 mg via ORAL
  Filled 2022-04-27: qty 1

## 2022-04-27 MED ORDER — SODIUM CHLORIDE 0.9 % IV SOLN: 1.0000 g | INTRAVENOUS | Status: DC

## 2022-04-27 MED ORDER — DICLOFENAC SODIUM 1 % EX GEL
2.0000 g | Freq: Four times a day (QID) | CUTANEOUS | Status: DC
  Administered 2022-04-27 – 2022-04-28 (×2): 2 g via TOPICAL
  Filled 2022-04-27: qty 100

## 2022-04-27 MED ORDER — LACTULOSE ENCEPHALOPATHY 10 GM/15ML PO SOLN: 30.0000 g | Freq: Three times a day (TID) | ORAL | Status: DC | PRN

## 2022-04-27 MED ORDER — SODIUM CHLORIDE (PF) 0.9 % IJ SOLN
INTRAMUSCULAR | Status: AC
  Filled 2022-04-27: qty 50

## 2022-04-27 MED ORDER — PANTOPRAZOLE SODIUM 40 MG PO TBEC
40.0000 mg | DELAYED_RELEASE_TABLET | Freq: Every day | ORAL | Status: DC
  Administered 2022-04-27 – 2022-04-28 (×2): 40 mg via ORAL
  Filled 2022-04-27 (×2): qty 1

## 2022-04-27 MED ORDER — SODIUM CHLORIDE 0.9 % IV SOLN: 500.0000 mg | INTRAVENOUS | Status: DC

## 2022-04-27 MED ORDER — PRAVASTATIN SODIUM 20 MG PO TABS: 40.0000 mg | ORAL_TABLET | Freq: Every day | ORAL | Status: DC

## 2022-04-27 MED ORDER — POTASSIUM CHLORIDE CRYS ER 20 MEQ PO TBCR
20.0000 meq | EXTENDED_RELEASE_TABLET | Freq: Every day | ORAL | Status: DC
  Administered 2022-04-27 – 2022-04-28 (×2): 20 meq via ORAL
  Filled 2022-04-27 (×2): qty 1

## 2022-04-27 MED ORDER — ONDANSETRON HCL 4 MG/2ML IJ SOLN: 4.0000 mg | Freq: Four times a day (QID) | INTRAMUSCULAR | Status: DC | PRN

## 2022-04-27 MED ORDER — LORATADINE 10 MG PO TABS
10.0000 mg | ORAL_TABLET | Freq: Every day | ORAL | Status: DC
  Filled 2022-04-27: qty 1

## 2022-04-27 MED ORDER — LEVOCETIRIZINE DIHYDROCHLORIDE 5 MG PO TABS: 5.0000 mg | ORAL_TABLET | Freq: Every day | ORAL | Status: DC

## 2022-04-27 MED ORDER — ONDANSETRON HCL 4 MG PO TABS: 4.0000 mg | ORAL_TABLET | Freq: Four times a day (QID) | ORAL | Status: DC | PRN

## 2022-04-27 MED ORDER — SODIUM CHLORIDE 0.45 % IV SOLN: INTRAVENOUS | Status: AC

## 2022-04-27 MED ORDER — SODIUM CHLORIDE 0.9 % IV SOLN
500.0000 mg | Freq: Once | INTRAVENOUS | Status: AC
  Administered 2022-04-27: 500 mg via INTRAVENOUS
  Filled 2022-04-27: qty 5

## 2022-04-27 MED ORDER — MECLIZINE HCL 25 MG PO TABS: 12.5000 mg | ORAL_TABLET | Freq: Three times a day (TID) | ORAL | Status: DC | PRN

## 2022-04-27 MED ORDER — ALPRAZOLAM 0.5 MG PO TABS
1.0000 mg | ORAL_TABLET | Freq: Three times a day (TID) | ORAL | Status: DC | PRN
  Administered 2022-04-27 – 2022-04-28 (×3): 1 mg via ORAL
  Filled 2022-04-27 (×3): qty 2

## 2022-04-27 MED ORDER — SODIUM CHLORIDE 0.9 % IV SOLN: INTRAVENOUS | Status: DC

## 2022-04-27 MED ORDER — ACETAMINOPHEN 650 MG RE SUPP: 650.0000 mg | Freq: Four times a day (QID) | RECTAL | Status: DC | PRN

## 2022-04-27 MED ORDER — ACETAMINOPHEN 325 MG PO TABS: 650.0000 mg | ORAL_TABLET | Freq: Four times a day (QID) | ORAL | Status: DC | PRN

## 2022-04-27 MED ORDER — DICLOFENAC SODIUM 1 % EX GEL: 2.0000 g | CUTANEOUS | Status: DC

## 2022-04-27 MED ORDER — MOMETASONE FURO-FORMOTEROL FUM 200-5 MCG/ACT IN AERO
2.0000 | INHALATION_SPRAY | Freq: Two times a day (BID) | RESPIRATORY_TRACT | Status: DC
  Administered 2022-04-27 – 2022-04-28 (×2): 2 via RESPIRATORY_TRACT
  Filled 2022-04-27: qty 8.8

## 2022-04-27 MED ORDER — NORTRIPTYLINE HCL 25 MG PO CAPS
50.0000 mg | ORAL_CAPSULE | Freq: Every day | ORAL | Status: DC
  Administered 2022-04-27: 50 mg via ORAL
  Filled 2022-04-27: qty 2

## 2022-04-27 NOTE — Progress Notes (Signed)
  Carryover admission to the Day Admitter.  I discussed this case with the EDP, Dr. Randal Buba.  Per these discussions:   This is a 76 year old female with chronic hypoxic respiratory failure on 4 L continuous nasal cannula at baseline, severe COPD, chronic diastolic heart failure, who is being admitted for obstipation, with CT abdomen/pelvis showing significant stool burden throughout the the colon in the absence of any evidence of bowel perforation.  Supportive constipation occurred in spite of scheduled lactulose and use of Linzess as an outpatient.  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  I have placed an order for admission for further evaluation and management of obstipation, likely requiring bowel motility agents administered both proximally and distally.   I have placed some additional preliminary admit orders via the adult multi-morbid admission order set.     [name], DO Hospitalist

## 2022-04-27 NOTE — ED Provider Notes (Signed)
[facility] DEPT Provider Note   CSN: [id number] Arrival date & time: 04/26/22  2034     History  Chief Complaint  Patient presents with   Constipation    [name] is a 76 y.o. female.  The history is provided by the patient.  Constipation Severity:  Severe Time since last bowel movement:  2 weeks Timing:  Constant Progression:  Worsening Chronicity:  Recurrent Context comment:  Antibiotics from an infection she was hospitalized Relieved by:  Nothing Worsened by:  Nothing Ineffective treatments:  Laxatives (lactulose and Linzess) Associated symptoms: no fever and no vomiting   Risk factors: recent antibiotic use        Home Medications Prior to Admission medications   Medication Sig Start Date End Date Taking? Authorizing Provider  ALPRAZolam (XANAX) 1 MG tablet TAKE 1 TABLET BY MOUTH 3 TIMES DAILY AS NEEDED FOR ANXIETY. Patient taking differently: Take 1 mg by mouth 3 (three) times daily as needed for anxiety. 03/11/22   [name], MD  ATROVENT HFA 17 MCG/ACT inhaler INHALE 2 PUFFS INTO THE LUNGS EVERY 4 HOURS AS NEEDED Patient taking differently: Inhale 2 puffs into the lungs every 4 (four) hours as needed for wheezing. 01/20/22   [name], MD  budesonide-formoterol [facility]) 160-4.5 MCG/ACT inhaler Inhale 2 puffs into the lungs 2 (two) times daily. Patient not taking: Reported on 04/22/2022 01/13/22   Hunsucker, Bonna Gains, MD  butalbital-acetaminophen-caffeine (FIORICET) 50-325-40 MG tablet TAKE 1 TABLET BY MOUTH EVERY 4 HOURS AS NEEDED FOR HEADACHE. Patient taking differently: Take 1 tablet by mouth every 4 (four) hours as needed for headache. 04/08/22   [name], MD  cholecalciferol (VITAMIN D) 1000 units tablet Take 1,000 Units by mouth daily.    [provider]  diclofenac Sodium (VOLTAREN) 1 % GEL APPLY 2 GRAMS TO AFFECTED AREA 4 TIMES A DAY Patient taking differently: Apply 2 g topically 4 (four)  times daily. 03/03/22   [name], MD  Fluticasone-Umeclidin-Vilant (TRELEGY ELLIPTA) 100-62.5-25 MCG/ACT AEPB Inhale 1 puff into the lungs daily. 01/07/22   [name], MD  furosemide (LASIX) 40 MG tablet TAKE 1 TABLET BY MOUTH TWICE A DAY Patient taking differently: Take 40 mg by mouth 2 (two) times daily. 02/25/22   [name], MD  ipratropium-albuterol (DUONEB) 0.5-2.5 (3) MG/3ML SOLN INHALE 3 MLS INTO THE LUNGS EVERY 6 HOURS AS NEEDED (FOR FOR SHORTNESS OF BREATH [place] OR COUGH). Patient taking differently: Inhale 3 mLs into the lungs every 6 (six) hours as needed (shortness of breath). 04/03/22   [name], MD  KLOR-CON M20 20 MEQ tablet TAKE 1 TABLET BY MOUTH EVERY DAY Patient taking differently: Take 20 mEq by mouth daily. 11/20/21   [name], MD  lactulose, encephalopathy, (ENULOSE) 10 GM/15ML SOLN Take 45 mLs (30 g total) by mouth 3 (three) times daily as needed (constipation). 01/26/22   [name], MD  levocetirizine (XYZAL) 5 MG tablet TAKE 1 TABLET BY MOUTH EVERY DAY IN THE EVENING Patient taking differently: Take 5 mg by mouth every evening. 01/31/22   [name], MD  LINZESS 72 MCG capsule TAKE 1 CAPSULE BY MOUTH EVERY DAY BEFORE BREAKFAST Patient taking differently: Take 72 mcg by mouth daily before breakfast. 11/20/21   [name], MD  LIVALO 2 MG TABS TAKE 1 TABLET BY MOUTH EVERY DAY Patient taking differently: Take 2 mg by mouth daily at 12 noon. 02/17/22   Hilty,  Nadean Corwin, MD  mometasone-formoterol (DULERA) 200-5 MCG/ACT AERO Inhale 2 puffs into the lungs 2 (two) times daily. 04/05/22   Hunsucker, Bonna Gains, MD  montelukast (SINGULAIR) 10 MG tablet TAKE 1 TABLET BY MOUTH EVERYDAY AT BEDTIME Patient taking differently: Take 10 mg by mouth at bedtime. 02/25/22   [name], MD  nortriptyline (PAMELOR) 25 MG capsule TAKE 2 CAPSULES BY MOUTH AT BEDTIME Patient taking differently: Take 50 mg by mouth at bedtime. 03/18/22   [name], MD   Omega 3 1200 MG CAPS Take 1,200 mg by mouth every morning.     [provider]  OXYGEN-HELIUM IN Inhale 3 L into the lungs See admin instructions. Uses when needed during the day, uses continuous throughout the night    [provider]  pantoprazole (PROTONIX) 40 MG tablet TAKE 1 TABLET BY MOUTH EVERY DAY 04/17/22   [name], MD  polyvinyl alcohol (LIQUIFILM TEARS) 1.4 % ophthalmic solution Place 1 drop into both eyes as needed for dry eyes.    [provider]  VENTOLIN HFA 108 (90 Base) MCG/ACT inhaler INHALE 1-2 PUFFS BY MOUTH EVERY 6 HOURS AS NEEDED FOR WHEEZE OR SHORTNESS OF BREATH Patient taking differently: Inhale 1-2 puffs into the lungs every 6 (six) hours as needed for wheezing or shortness of breath. 01/13/22   [name], MD  vitamin B-12 (CYANOCOBALAMIN) 1000 MCG tablet Take 1 tablet (1,000 mcg total) by mouth daily. 03/09/22   [name], MD  vitamin C (ASCORBIC ACID) 500 MG tablet Take 500 mg by mouth daily.    [provider]      Allergies    Ceftriaxone, Hydroxyzine, Doxycycline, and Lexapro [escitalopram]    Review of Systems   Review of Systems  Constitutional:  Negative for fever.  HENT:  Positive for facial swelling.   Eyes:  Negative for redness.  Respiratory:  Negative for wheezing and stridor.   Gastrointestinal:  Positive for constipation. Negative for vomiting.  All other systems reviewed and are negative.   Physical Exam Updated Vital Signs BP (!) 156/72   Pulse 76   Temp (!) 97.5 F (36.4 C) (Oral)   Resp 20   SpO2 98%  Physical Exam Vitals and nursing note reviewed.  Constitutional:      General: She is not in acute distress.    Appearance: Normal appearance. She is well-developed.  HENT:     Head: Normocephalic and atraumatic.     Nose: Nose normal.  Eyes:     Pupils: Pupils are equal, round, and reactive to light.  Cardiovascular:     Rate and Rhythm: Normal rate and regular rhythm.      Pulses: Normal pulses.     Heart sounds: Normal heart sounds.  Pulmonary:     Effort: No respiratory distress.     Breath sounds: Rhonchi present.  Abdominal:     General: There is distension.     Tenderness: There is no abdominal tenderness. There is no guarding or rebound.     Comments: Hypoactive BS  Genitourinary:    Vagina: No vaginal discharge.  Musculoskeletal:        General: Normal range of motion.     Cervical back: Neck supple.  Skin:    General: Skin is dry.     Capillary Refill: Capillary refill takes less than 2 seconds.     Findings: No erythema or rash.  Neurological:     General: No focal deficit present.  Mental Status: She is alert and oriented to person, place, and time.     Deep Tendon Reflexes: Reflexes normal.  Psychiatric:        Mood and Affect: Mood normal.     ED Results / Procedures / Treatments   Labs (all labs ordered are listed, but only abnormal results are displayed) Results for orders placed or performed during the hospital encounter of 04/26/22  Comprehensive metabolic panel  Result Value Ref Range   Sodium 135 135 - 145 mmol/L   Potassium 4.2 3.5 - 5.1 mmol/L   Chloride 87 (L) 98 - 111 mmol/L   CO2 35 (H) 22 - 32 mmol/L   Glucose, Bld 92 70 - 99 mg/dL   BUN 18 8 - 23 mg/dL   Creatinine, Ser 0.94 0.44 - 1.00 mg/dL   Calcium 10.3 8.9 - 10.3 mg/dL   Total Protein 7.5 6.5 - 8.1 g/dL   Albumin 3.8 3.5 - 5.0 g/dL   AST 23 15 - 41 U/L   ALT 14 0 - 44 U/L   Alkaline Phosphatase 48 38 - 126 U/L   Total Bilirubin 0.5 0.3 - 1.2 mg/dL   GFR, Estimated >60 >60 mL/min   Anion gap 13 5 - 15  Lipase, blood  Result Value Ref Range   Lipase 40 11 - 51 U/L  CBC with Differential  Result Value Ref Range   WBC 7.6 4.0 - 10.5 K/uL   RBC 3.93 3.87 - 5.11 MIL/uL   Hemoglobin 10.9 (L) 12.0 - 15.0 g/dL   HCT 36.1 36.0 - 46.0 %   MCV 91.9 80.0 - 100.0 fL   MCH 27.7 26.0 - 34.0 pg   MCHC 30.2 30.0 - 36.0 g/dL   RDW 15.1 11.5 - 15.5 %    Platelets 334 150 - 400 K/uL   nRBC 0.0 0.0 - 0.2 %   Neutrophils Relative % 79 %   Neutro Abs 6.0 1.7 - 7.7 K/uL   Lymphocytes Relative 13 %   Lymphs Abs 0.9 0.7 - 4.0 K/uL   Monocytes Relative 7 %   Monocytes Absolute 0.5 0.1 - 1.0 K/uL   Eosinophils Relative 0 %   Eosinophils Absolute 0.0 0.0 - 0.5 K/uL   Basophils Relative 1 %   Basophils Absolute 0.1 0.0 - 0.1 K/uL   Immature Granulocytes 0 %   Abs Immature Granulocytes 0.01 0.00 - 0.07 K/uL  Urinalysis, Routine w reflex microscopic Urine, Clean Catch  Result Value Ref Range   Color, Urine YELLOW YELLOW   APPearance CLEAR CLEAR   Specific Gravity, Urine 1.011 1.005 - 1.030   pH 7.0 5.0 - 8.0   Glucose, UA NEGATIVE NEGATIVE mg/dL   Hgb urine dipstick NEGATIVE NEGATIVE   Bilirubin Urine NEGATIVE NEGATIVE   Ketones, ur NEGATIVE NEGATIVE mg/dL   Protein, ur NEGATIVE NEGATIVE mg/dL   Nitrite NEGATIVE NEGATIVE   Leukocytes,Ua NEGATIVE NEGATIVE   CT ABDOMEN PELVIS W CONTRAST  Result Date: 04/27/2022 CLINICAL DATA:  75 year old female with history of blunt trauma. Constipation for 6 days after taking antibiotics. EXAM: CT ABDOMEN AND PELVIS WITH CONTRAST TECHNIQUE: Multidetector CT imaging of the abdomen and pelvis was performed using the standard protocol following bolus administration of intravenous contrast. RADIATION DOSE REDUCTION: This exam was performed according to the departmental dose-optimization program which includes automated exposure control, adjustment of the mA and/or kV according to patient size and/or use of iterative reconstruction technique. CONTRAST:  37m OMNIPAQUE IOHEXOL 300 MG/ML  SOLN COMPARISON:  CT  of the abdomen and pelvis 04/22/2020. FINDINGS: Lower chest: Scattered areas of bronchial wall thickening, thickening of the peribronchovascular interstitium, peribronchovascular micro nodularity and regional architectural distortion noted in the lung bases, similar to but slightly increased compared to the prior  study, likely reflective of a chronic indolent atypical infectious process with areas of mucoid impaction within terminal bronchioles. The largest of these small pulmonary nodules measures 6 x 4 mm (mean diameter of 5 mm) in the periphery of the left lower lobe (axial image 34 of series 3), similar to the prior study. Atherosclerotic calcifications are noted in the descending thoracic aorta as well as the right coronary artery. Severe calcifications of the aortic valve. Hepatobiliary: No suspicious cystic or solid hepatic lesions. No intra or extrahepatic biliary ductal dilatation. Status post cholecystectomy. Pancreas: No pancreatic mass. No pancreatic ductal dilatation. No pancreatic or peripancreatic fluid collections or inflammatory changes. Spleen: Unremarkable. Adrenals/Urinary Tract: 1.8 cm exophytic low-attenuation lesion in the anterior aspect of the interpolar region of the right kidney is compatible with a small simple cyst, as is an exophytic 1.2 cm lesion in the lateral aspect of the interpolar region of the left kidney (no imaging follow-up is recommended). No suspicious renal lesions are noted. Cortical scarring in the posterior aspect of the left kidney incidentally noted. No hydroureteronephrosis. Urinary bladder is normal in appearance. Bilateral adrenal glands are normal in appearance. Stomach/Bowel: The appearance of the stomach is unremarkable. There is no pathologic dilatation of small bowel or colon. Large volume of stool noted throughout the colon and rectum. The appendix is not confidently identified and may be surgically absent. Regardless, there are no inflammatory changes noted adjacent to the cecum to suggest the presence of an acute appendicitis at this time. Infraumbilical ventral hernia containing several loops of mid to distal small bowel, similar to the prior examination. Vascular/Lymphatic: Aortic atherosclerosis with mild fusiform ectasia of the infrarenal abdominal aorta which  measures up to 2.5 x 2.4 cm. No aneurysm or dissection noted in the abdominal or pelvic vasculature. No lymphadenopathy noted in the abdomen or pelvis. Reproductive: Uterus and ovaries are unremarkable in appearance. Other: No significant volume of ascites.  No pneumoperitoneum. Musculoskeletal: There are no aggressive appearing lytic or blastic lesions noted in the visualized portions of the skeleton. IMPRESSION: 1. No acute findings are noted in the abdomen or pelvis. 2. Moderate-sized infraumbilical ventral hernia containing several loops of mid to distal small bowel, without associated bowel incarceration or obstruction at this time. 3. Large volume of stool throughout the colon and rectum, compatible with reported clinical history of constipation. 4. 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 Multiple small nodules in this region likely reflect areas of chronic mucoid impaction within terminal bronchioles. Outpatient referral to Pulmonology for further clinical evaluation is recommended. Follow-up nonemergent outpatient high-resolution chest CT should also be considered to better evaluate these findings. 5. Aortic atherosclerosis, in addition to at least right coronary artery disease. Assessment for potential risk factor modification, dietary therapy or pharmacologic therapy may be warranted, if clinically indicated. 6. There are severe calcifications of the aortic valve. Echocardiographic correlation for evaluation of potential valvular dysfunction may be warranted if clinically indicated. Electronically Signed   By: Vinnie Langton M.D.   On: 04/27/2022 05:53   MR BRAIN W WO CONTRAST  Result Date: 04/09/2022 CLINICAL DATA:  Initial evaluation for dizziness. EXAM: MRI HEAD WITHOUT AND WITH CONTRAST TECHNIQUE: Multiplanar, multiecho pulse sequences  of the brain and surrounding structures were obtained  without and with intravenous contrast. CONTRAST:  74m GADAVIST GADOBUTROL 1 MMOL/ML IV SOLN COMPARISON:  Prior CT from earlier the same day. FINDINGS: Brain: Cerebral volume within normal limits. Moderate chronic microvascular ischemic disease noted involving the periventricular deep white matter both cerebral hemispheres as well as the pons. No evidence for acute or subacute ischemia. Gray-white matter differentiation maintained. No areas of chronic cortical infarction. No acute or chronic intracranial blood products. No mass lesion, midline shift or mass effect. No hydrocephalus or extra-axial fluid collection. Pituitary gland suprasellar region normal. No abnormal enhancement. No evidence for intracranial spread of infection. Vascular: Major intracranial vascular flow voids are maintained. Skull and upper cervical spine: Craniocervical junction normal. Bone marrow signal intensity within normal limits. No scalp soft tissue abnormality. Sinuses/Orbits: Prior bilateral ocular lens replacement. Mild scattered mucosal thickening noted about the ethmoidal air cells. Paranasal sinuses are otherwise clear. Other: Right mastoid and middle ear effusion with associated enhancement. Findings better characterized on prior temporal bone CT. IMPRESSION: 1. No acute intracranial abnormality. 2. Moderate chronic microvascular ischemic disease. 3. Right mastoid and middle ear effusion, better characterized on prior temporal bone CT. Electronically Signed   By: BJeannine BogaM.D.   On: 04/09/2022 23:05   CT Temporal Bones W Contrast  Result Date: 04/09/2022 CLINICAL DATA:  Right-sided mastoiditis. Your infection. EXAM: CT TEMPORAL BONES WITH CONTRAST TECHNIQUE: Axial and coronal plane CT imaging of the petrous temporal bones was performed with thin-collimation image reconstruction following intravenous contrast administration. Multiplanar CT image reconstructions were also generated. RADIATION DOSE REDUCTION: This exam  was performed according to the departmental dose-optimization program which includes automated exposure control, adjustment of the mA and/or kV according to patient size and/or use of iterative reconstruction technique. CONTRAST:  77mOMNIPAQUE IOHEXOL 300 MG/ML  SOLN COMPARISON:  None Available. FINDINGS: RIGHT TEMPORAL BONE External auditory canal: Right external auditory canal is opacified medially. Erosive changes are present along the posterior and superior aspect of the canal. Middle ear cavity: The middle ear cavity is opacified. Ossicles are within normal limits. Fluid or soft tissue extends into the epitympanum. There is some blunting of the scutum. Inner ear structures: The cochlea, vestibule and semicircular canals are normal. The vestibular aqueduct is not enlarged. Internal auditory and facial nerve canals:  Normal Mastoid air cells: Mastoid air cells are diffusely opacified there is some coalescence of cells. Cortex is breech posteriorly adjacent to the sigmoid sinus. LEFT TEMPORAL BONE External auditory canal: Cerumen is present in the medial canal. The external auditory canal is otherwise within normal limits. Middle ear cavity: Normally aerated. The scutum and ossicles are normal. The tegmen tympani is intact. Inner ear structures: The cochlea, vestibule and semicircular canals are normal. The vestibular aqueduct is not enlarged. Internal auditory and facial nerve canals:  Normal. Mastoid air cells: Normally aerated. No osseous erosion. Vascular: Normal appearance of the carotid canals, jugular bulbs and sigmoid plates. The dural sinuses are patent. Limited intracranial:  Within normal limits. Visible orbits/paranasal sinuses: Bilateral lens replacements are noted. Globes and orbits are otherwise unremarkable. Paranasal sinuses are clear. Soft tissues: Soft tissue enhancement is noted along the lateral inferior aspect of the right mastoid. No abscess is present. IMPRESSION: 1. Right middle ear and  mastoid disease with some coalescence of cells and soft tissue along the lateral inferior aspect of the mastoid compatible with acute mastoiditis. 2. Erosive changes along the posterior and superior aspect of the right external auditory  canal consistent with aggressive otitis externa. 3. Erosive changes of the right scutum suggesting a chronic process. 4. The posterior right mastoid air cells are eroded without enhancement along the dura 5. Normal appearance of the left temporal bone. Electronically Signed   By: San Morelle M.D.   On: 04/09/2022 20:21   DG Chest Port 1 View  Result Date: 04/09/2022 CLINICAL DATA:  Shortness of breath EXAM: PORTABLE CHEST 1 VIEW COMPARISON:  03/21/2022 FINDINGS: Transverse diameter of the heart is increased. There are no signs of alveolar pulmonary edema or new focal infiltrates. There is no pleural effusion or pneumothorax. There is possible skin fold overlying the lateral aspect of the right lower lung field. Surgical clips are seen in right chest wall. IMPRESSION: There are no signs of pulmonary edema or new focal infiltrates. Electronically Signed   By: Elmer Picker M.D.   On: 04/09/2022 20:06     None  Radiology CT ABDOMEN PELVIS W CONTRAST  Result Date: 04/27/2022 CLINICAL DATA:  76 year old female with history of blunt trauma. Constipation for 6 days after taking antibiotics. EXAM: CT ABDOMEN AND PELVIS WITH CONTRAST TECHNIQUE: Multidetector CT imaging of the abdomen and pelvis was performed using the standard protocol following bolus administration of intravenous contrast. RADIATION DOSE REDUCTION: This exam was performed according to the departmental dose-optimization program which includes automated exposure control, adjustment of the mA and/or kV according to patient size and/or use of iterative reconstruction technique. CONTRAST:  39m OMNIPAQUE IOHEXOL 300 MG/ML  SOLN COMPARISON:  CT of the abdomen and pelvis 04/22/2020. FINDINGS: Lower chest:  Scattered areas of bronchial wall thickening, thickening of the peribronchovascular interstitium, peribronchovascular micro nodularity and regional architectural distortion noted in the lung bases, similar to but slightly increased compared to the prior study, likely reflective of a chronic indolent atypical infectious process with areas of mucoid impaction within terminal bronchioles. The largest of these small pulmonary nodules measures 6 x 4 mm (mean diameter of 5 mm) in the periphery of the left lower lobe (axial image 34 of series 3), similar to the prior study. Atherosclerotic calcifications are noted in the descending thoracic aorta as well as the right coronary artery. Severe calcifications of the aortic valve. Hepatobiliary: No suspicious cystic or solid hepatic lesions. No intra or extrahepatic biliary ductal dilatation. Status post cholecystectomy. Pancreas: No pancreatic mass. No pancreatic ductal dilatation. No pancreatic or peripancreatic fluid collections or inflammatory changes. Spleen: Unremarkable. Adrenals/Urinary Tract: 1.8 cm exophytic low-attenuation lesion in the anterior aspect of the interpolar region of the right kidney is compatible with a small simple cyst, as is an exophytic 1.2 cm lesion in the lateral aspect of the interpolar region of the left kidney (no imaging follow-up is recommended). No suspicious renal lesions are noted. Cortical scarring in the posterior aspect of the left kidney incidentally noted. No hydroureteronephrosis. Urinary bladder is normal in appearance. Bilateral adrenal glands are normal in appearance. Stomach/Bowel: The appearance of the stomach is unremarkable. There is no pathologic dilatation of small bowel or colon. Large volume of stool noted throughout the colon and rectum. The appendix is not confidently identified and may be surgically absent. Regardless, there are no inflammatory changes noted adjacent to the cecum to suggest the presence of an acute  appendicitis at this time. Infraumbilical ventral hernia containing several loops of mid to distal small bowel, similar to the prior examination. Vascular/Lymphatic: Aortic atherosclerosis with mild fusiform ectasia of the infrarenal abdominal aorta which measures up to 2.5 x 2.4 cm. No aneurysm  or dissection noted in the abdominal or pelvic vasculature. No lymphadenopathy noted in the abdomen or pelvis. Reproductive: Uterus and ovaries are unremarkable in appearance. Other: No significant volume of ascites.  No pneumoperitoneum. Musculoskeletal: There are no aggressive appearing lytic or blastic lesions noted in the visualized portions of the skeleton. IMPRESSION: 1. No acute findings are noted in the abdomen or pelvis. 2. Moderate-sized infraumbilical ventral hernia containing several loops of mid to distal small bowel, without associated bowel incarceration or obstruction at this time. 3. Large volume of stool throughout the colon and rectum, compatible with reported clinical history of constipation. 4. 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 Multiple small nodules in this region likely reflect areas of chronic mucoid impaction within terminal bronchioles. Outpatient referral to Pulmonology for further clinical evaluation is recommended. Follow-up nonemergent outpatient high-resolution chest CT should also be considered to better evaluate these findings. 5. Aortic atherosclerosis, in addition to at least right coronary artery disease. Assessment for potential risk factor modification, dietary therapy or pharmacologic therapy may be warranted, if clinically indicated. 6. There are severe calcifications of the aortic valve. Echocardiographic correlation for evaluation of potential valvular dysfunction may be warranted if clinically indicated. Electronically Signed   By: Vinnie Langton M.D.   On: 04/27/2022  05:53    Procedures Procedures    Medications Ordered in ED Medications  azithromycin (ZITHROMAX) 500 mg in sodium chloride 0.9 % 250 mL IVPB (has no administration in time range)  milk and molasses enema (has no administration in time range)  0.9 %  sodium chloride infusion (has no administration in time range)  sodium chloride 0.9 % bolus 250 mL (250 mLs Intravenous New Bag/Given 04/27/22 0504)  sodium chloride (PF) 0.9 % injection (  Given by Other 04/27/22 0530)  iohexol (OMNIPAQUE) 300 MG/ML solution 80 mL (80 mLs Intravenous Contrast Given 04/27/22 0511)    ED Course/ Medical Decision Making/ A&P                           Medical Decision Making Amount and/or Complexity of Data Reviewed External Data Reviewed: notes.    Details: previous admission for mastoiditis antibiotics. Labs: ordered.    Details: all labs reviewed: normal sodium 135, normal potassium 4.2, creatinine normal .94 normal AST and ALT.  normal lipase 40.  Normal white count 7.6, hemoglobin low 10.9, platelets 334K, urine is negative for UTI Radiology: ordered and independent interpretation performed.    Details: CT with obstipation by me  Risk Prescription drug management. Decision regarding hospitalization. Risk Details: Patient with obstipation and MAI on CT scan.  Given tenuous pulmonary and cardiac status will need ginger approach to evacuation of stool.  Will culture for MAI.      Final Clinical Impression(s) / ED Diagnoses Final diagnoses:  Obstipation  MAI (mycobacterium avium-intracellulare) ([place])   The patient appears reasonably stabilized for admission considering the current resources, flow, and capabilities available in the ED at this time, and I doubt any other [facility] requiring further screening and/or treatment in the ED prior to admission.  Rx / DC Orders ED Discharge Orders     None         [place], Bertrum Helmstetter, MD 04/27/22 6160

## 2022-04-27 NOTE — ED Notes (Signed)
Pt large bowel movement at this time.

## 2022-04-27 NOTE — H&P (Signed)
History and Physical    Patient: [name] [id number]:[id number] DOB: 07-Jan-1946 DOA: 04/26/2022 DOS: the patient was seen and examined on 04/27/2022 PCP: Barbara Lima, MD  Patient coming from: Home  Chief Complaint:  Chief Complaint  Patient presents with   Constipation   HPI: [name] is a 76 y.o. female with medical history significant of Acute liver failure, abnormal LFTs, acute renal failure, acute respiratory failure, altered mental status, CAD, angina, anxiety, depression, aortic stenosis, community-acquired pneumonia, COPD, chronic respiratory failure on home oxygen chronic diastolic CHF, hyperlipidemia, GERD, history of SBO, history of bowel infarction, hypertension, hyponatremia, migraine headaches, moderate to severe pulmonary hypertension, nonsustained V. tach who presented to the emergency department with complaints of constipation for the past 6 days despite taking Linzess and lactulose.  Has felt nauseous, but no emesis.  She denied fever, chills, rhinorrhea, sore throat, wheezing or hemoptysis.  No chest pain, palpitations, diaphoresis, PND, orthopnea or pitting edema of the lower extremities.  Her appetite is decreased.  No abdominal pain, emesis, diarrhea melena or hematochezia.  No flank pain, dysuria, frequency or hematuria.  No polyuria, polydipsia, polyphagia or blurred vision.  She has felt generally weak.  ED course: Initial vital signs were temperature 97.5 F, pulse 77 respirations 16, BP 149/76 mmHg O2 sat 98% on 2 L via nasal cannula.  The patient received 250 mL NS bolus, and milk of molasses enema and 500 mg of azithromycin IVPB.  Lab work: Urinalysis was normal.  CBC showed a white count 7.6, Mono 10.9 g/dL platelets 334.  Lipase was normal.  CMP showed a chloride of 87 and CO2 of 35 mmol/L, the rest of the CMP was negative.  Imaging: CT abdomen/pelvis with contrast showed a large volume of stool throughout the colon and rectum.  There was a moderate size  infraumbilical ventral hernia containing several loops of mid to distal small bowel without associated incarceration or obstruction.  Lung bases show a chronic indolent atypical infectious process with outpatient referral to pulmonology for further evaluation recommended along with nonemergent high-resolution CT.  CT chest was done which showed multiple new indistinct solid pulmonary nodules in both lungs with recommendation for noncontrast CT in 3 months.   Review of Systems: As mentioned in the history of present illness. All other systems reviewed and are negative.  Past Medical History:  Diagnosis Date   Abnormal LFTs 08/02/2011   Acute liver failure 06/19/2013   Acute renal failure ([place]) 06/19/2013   Acute respiratory failure ([place]) 06/19/2013   Altered mental status 08/01/2011   Angina    Anxiety    Anxiety state, unspecified 12/03/2013   Aortic stenosis    mild   Arthritis    "hands" (02/03/2017)   Breast cancer, right breast ([place]) dx'd 2008   CAD (coronary artery disease) of artery bypass graft 11/06/2013   CAD (coronary artery disease), MI R/O 08/01/2011   PCI in 2006 (bare metal stent, unknown artery) - [place]    CAP (community acquired pneumonia) 09/11/2018   CHF,  acute diastolic, BNP 4k on admissio 08/01/2011   Chronic bronchitis (HCC)    Chronic respiratory failure ([place]) 02/02/2012   Compression fracture of L1 lumbar vertebra ([place]) 02/02/2012   COPD (chronic obstructive pulmonary disease) ([place])    oxygen-dependent 4LPM    Coronary artery disease 2006   2 stents w/previous MI   Depressed    Diastolic CHF ([place])    Dyslipidemia    Family history of adverse reaction to anesthesia    "  daughter had c-section; missed twice w/epidural"   GERD (gastroesophageal reflux disease)    Heart murmur    History of blood transfusion    "w/my colon OR"   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   History of nuclear stress test 08/03/2011   attenuation at apex - no perfusion defects    HTN  (hypertension) 11/06/2013   Hyperkalemia, on ACE prior to admission 11/11/2011   Hypertension    Hyponatremia 01/31/2012   Migraine    "qod to q couple months since I was 21" (02/03/2017)   Mild aortic stenosis 08/01/2011   AVA 1.69 cm2 (06/02/2011)    Moderate to severe pulmonary hypertension (HCC)    NSTEMI (non-ST elevated myocardial infarction) ([place]) 2006   NSVT (nonsustained ventricular tachycardia) ([place])    h/o   On home oxygen therapy    "3L just at night; have it available prn duringtheday" (09/11/2018)   Pneumonia    "alot of times" (02/03/2017)   SBO (small bowel obstruction) ([place]) 04/23/2020   PARTIAL    Past Surgical History:  Procedure Laterality Date   BREAST BIOPSY Right 2008   BREAST LUMPECTOMY Right 2008   malignant   CATARACT EXTRACTION W/ INTRAOCULAR LENS  IMPLANT, BILATERAL Bilateral ~ 2013   [place]; 1971; [place]  11/2007   COLOSTOMY CLOSURE  07/2008   CORONARY ANGIOPLASTY WITH STENT PLACEMENT  2006   "2 stents"   ERCP N/A 09/05/2015   Procedure: ENDOSCOPIC RETROGRADE CHOLANGIOPANCREATOGRAPHY (ERCP);  Surgeon: Ladene Artist, MD;  Location: [facility];  Service: Endoscopy;  Laterality: N/A;   MYRINGOTOMY WITH TUBE PLACEMENT Right 04/11/2022   Procedure: TYMPANOSTOMY TUBE RIGHT EAR, EAR CLEANING AND REMOVAL OF FOREIGN BODIES;  Surgeon: Melissa Montane, MD;  Location: [facility];  Service: ENT;  Laterality: Right;   PARTIAL COLECTOMY  2009   for obstruction: temporary ostomy, later reversed.    RIGHT HEART CATHETERIZATION N/A 09/05/2013   Procedure: RIGHT HEART CATH;  Surgeon: Jolaine Artist, MD;  Location: [facility] C[facility] LAB;  Service: Cardiovascular;  Laterality: N/A;   TRANSTHORACIC ECHOCARDIOGRAM  11/12/2011   EF 27-25%, normal systolic function, grade 1 diastolic dysfunction; ventricular septal flattening (D-sign); mild AS; trace-mild MR; LA mildly dilated; RV mod dilated; RA mod dilated; severe pulm  HTN; elevated CVP   Social History:  reports that she has been smoking cigarettes. She has a 26.50 pack-year smoking history. She has never used smokeless tobacco. She reports current alcohol use of about 1.0 standard drink of alcohol per week. She reports that she does not use drugs.  Allergies  Allergen Reactions   Ceftriaxone Anaphylaxis and Other (See Comments)    *ROCEPHIN*  "Blow up like a balloon"   Hydroxyzine Shortness Of Breath and Other (See Comments)    Pt states med make her light headed, get sob sxs   Doxycycline Nausea And Vomiting   Lexapro [Escitalopram] Other (See Comments)    Pt states med make her dizzy    Family History  Problem Relation Age of Onset   Alzheimer's disease Mother    Schizophrenia Sister    Breast cancer Sister        61s   Heart disease Sister    Diabetes Sister    Hyperlipidemia Brother    Cancer Neg Hx    Stroke Neg Hx    COPD Neg Hx    Depression Neg Hx  Drug abuse Neg Hx    Early death Neg Hx    Hypertension Neg Hx    Kidney disease Neg Hx     Prior to Admission medications   Medication Sig Start Date End Date Taking? Authorizing Provider  ALPRAZolam (XANAX) 1 MG tablet TAKE 1 TABLET BY MOUTH 3 TIMES DAILY AS NEEDED FOR ANXIETY. Patient taking differently: Take 1 mg by mouth 3 (three) times daily as needed for anxiety. 03/11/22   Barbara Lima, MD  ATROVENT HFA 17 MCG/ACT inhaler INHALE 2 PUFFS INTO THE LUNGS EVERY 4 HOURS AS NEEDED Patient taking differently: Inhale 2 puffs into the lungs every 4 (four) hours as needed for wheezing. 01/20/22   Barbara Lima, MD  budesonide-formoterol [facility]) 160-4.5 MCG/ACT inhaler Inhale 2 puffs into the lungs 2 (two) times daily. Patient not taking: Reported on 04/22/2022 01/13/22   Hunsucker, Bonna Gains, MD  butalbital-acetaminophen-caffeine (FIORICET) 50-325-40 MG tablet TAKE 1 TABLET BY MOUTH EVERY 4 HOURS AS NEEDED FOR HEADACHE. Patient taking differently: Take 1 tablet by mouth every 4  (four) hours as needed for headache. 04/08/22   Barbara Lima, MD  cholecalciferol (VITAMIN D) 1000 units tablet Take 1,000 Units by mouth daily.    [provider]  diclofenac Sodium (VOLTAREN) 1 % GEL APPLY 2 GRAMS TO AFFECTED AREA 4 TIMES A DAY Patient taking differently: Apply 2 g topically 4 (four) times daily. 03/03/22   Barbara Lima, MD  Fluticasone-Umeclidin-Vilant (TRELEGY ELLIPTA) 100-62.5-25 MCG/ACT AEPB Inhale 1 puff into the lungs daily. 01/07/22   Barbara Lima, MD  furosemide (LASIX) 40 MG tablet TAKE 1 TABLET BY MOUTH TWICE A DAY Patient taking differently: Take 40 mg by mouth 2 (two) times daily. 02/25/22   Barbara Lima, MD  ipratropium-albuterol (DUONEB) 0.5-2.5 (3) MG/3ML SOLN INHALE 3 MLS INTO THE LUNGS EVERY 6 HOURS AS NEEDED (FOR FOR SHORTNESS OF BREATH [place] OR COUGH). Patient taking differently: Inhale 3 mLs into the lungs every 6 (six) hours as needed (shortness of breath). 04/03/22   Barbara Lima, MD  KLOR-CON M20 20 MEQ tablet TAKE 1 TABLET BY MOUTH EVERY DAY Patient taking differently: Take 20 mEq by mouth daily. 11/20/21   Barbara Lima, MD  lactulose, encephalopathy, (ENULOSE) 10 GM/15ML SOLN Take 45 mLs (30 g total) by mouth 3 (three) times daily as needed (constipation). 01/26/22   Barbara Lima, MD  levocetirizine (XYZAL) 5 MG tablet TAKE 1 TABLET BY MOUTH EVERY DAY IN THE EVENING Patient taking differently: Take 5 mg by mouth every evening. 01/31/22   Barbara Lima, MD  LINZESS 72 MCG capsule TAKE 1 CAPSULE BY MOUTH EVERY DAY BEFORE BREAKFAST Patient taking differently: Take 72 mcg by mouth daily before breakfast. 11/20/21   Barbara Lima, MD  LIVALO 2 MG TABS TAKE 1 TABLET BY MOUTH EVERY DAY Patient taking differently: Take 2 mg by mouth daily at 12 noon. 02/17/22   Hilty, Nadean Corwin, MD  mometasone-formoterol (DULERA) 200-5 MCG/ACT AERO Inhale 2 puffs into the lungs 2 (two) times daily. 04/05/22   Hunsucker, Bonna Gains, MD  montelukast (SINGULAIR)  10 MG tablet TAKE 1 TABLET BY MOUTH EVERYDAY AT BEDTIME Patient taking differently: Take 10 mg by mouth at bedtime. 02/25/22   Barbara Lima, MD  nortriptyline (PAMELOR) 25 MG capsule TAKE 2 CAPSULES BY MOUTH AT BEDTIME Patient taking differently: Take 50 mg by mouth at bedtime. 03/18/22   Barbara Lima, MD  Omega 3 1200 MG CAPS  Take 1,200 mg by mouth every morning.     [provider]  OXYGEN-HELIUM IN Inhale 3 L into the lungs See admin instructions. Uses when needed during the day, uses continuous throughout the night    [provider]  pantoprazole (PROTONIX) 40 MG tablet TAKE 1 TABLET BY MOUTH EVERY DAY 04/17/22   Barbara Lima, MD  polyvinyl alcohol (LIQUIFILM TEARS) 1.4 % ophthalmic solution Place 1 drop into both eyes as needed for dry eyes.    [provider]  VENTOLIN HFA 108 (90 Base) MCG/ACT inhaler INHALE 1-2 PUFFS BY MOUTH EVERY 6 HOURS AS NEEDED FOR WHEEZE OR SHORTNESS OF BREATH Patient taking differently: Inhale 1-2 puffs into the lungs every 6 (six) hours as needed for wheezing or shortness of breath. 01/13/22   Barbara Lima, MD  vitamin B-12 (CYANOCOBALAMIN) 1000 MCG tablet Take 1 tablet (1,000 mcg total) by mouth daily. 03/09/22   Barbara Lima, MD  vitamin C (ASCORBIC ACID) 500 MG tablet Take 500 mg by mouth daily.    [provider]    Physical Exam: Vitals:   04/27/22 0300 04/27/22 0400 04/27/22 0500 04/27/22 0600  BP: 106/84 (!) 167/70 (!) 145/70 (!) 156/72  Pulse: 88 76 69 76  Resp: '16 18 16 20  '$ Temp: (!) 97.5 F (36.4 C)     TempSrc: Oral     SpO2: 94% 96% 94% 98%   Physical Exam Vitals and nursing note reviewed.  HENT:     Head: Normocephalic.     Mouth/Throat:     Mouth: Mucous membranes are moist.  Eyes:     General: No scleral icterus.    Pupils: Pupils are equal, round, and reactive to light.  Neck:     Vascular: No JVD.  Cardiovascular:     Rate and Rhythm: Normal rate and regular rhythm.     Heart sounds:  S1 normal and S2 normal.  Pulmonary:     Effort: Pulmonary effort is normal. No tachypnea.     Breath sounds: Examination of the right-lower field reveals decreased breath sounds and rales. Examination of the left-lower field reveals decreased breath sounds and rales. Decreased breath sounds and rales present. No wheezing or rhonchi.  Abdominal:     General: Bowel sounds are normal. There is no distension.     Palpations: Abdomen is soft.     Tenderness: There is no abdominal tenderness. There is no guarding.  Musculoskeletal:     Cervical back: Neck supple.     Right lower leg: No edema.     Left lower leg: No edema.  Skin:    General: Skin is warm and dry.  Neurological:     General: No focal deficit present.     Mental Status: She is alert and oriented to person, place, and time.  Psychiatric:        Mood and Affect: Mood normal.        Behavior: Behavior normal.    Data Reviewed:  There are no new results to review at this time.  Assessment and Plan: Principal Problem:   Obstipation In the setting of:   Chronic idiopathic constipation Observation/telemetry. Clear liquid diet. Continue Linzess. Continue lactulose 3 times daily as needed Antiemetics as needed. Keep electrolytes optimized.  Active Problems:   Abnormal CT of the chest Multiple pulmonary nodules. Questionable MAI infection. Briefly discussed with Dr. Juleen China from [facility]. No need for azithromycin at this time. Patient to follow-up with pulmonology. Will need repeat  CT in 3 months.    Chronic diastolic CHF (congestive heart failure) (HCC) No signs of decompensation. Continue furosemide 40 mg p.o. twice daily.    Chronic respiratory failure with hypoxia (HCC) Continue supplemental oxygen. Continue Dulera twice daily. Continue DuoNebs as needed.    HTN (hypertension) Currently not on medical therapy. Avoid calcium channel blockers. Monitor blood pressure due to constipation    Atherosclerosis of  aorta (HCC)   Hyperlipidemia LDL goal <70 Continue statin.    Normocytic anemia Monitor hematocrit and hemoglobin.      Advance Care Planning:   Code Status: Full Code   Consults:   Family Communication:   Severity of Illness: The appropriate patient status for this patient is INPATIENT. Inpatient status is judged to be reasonable and necessary in order to provide the required intensity of service to ensure the patient's safety. The patient's presenting symptoms, physical exam findings, and initial radiographic and laboratory data in the context of their chronic comorbidities is felt to place them at high risk for further clinical deterioration. Furthermore, it is not anticipated that the patient will be medically stable for discharge from the hospital within 2 midnights of admission.   * I certify that at the point of admission it is my clinical judgment that the patient will require inpatient hospital care spanning beyond 2 midnights from the point of admission due to high intensity of service, high risk for further deterioration and high frequency of surveillance required.*  Author: Reubin Milan, MD 04/27/2022 7:56 AM  For on call review [email or web address].   This document was prepared using Dragon voice recognition software and may contain some unintended transcription errors.

## 2022-04-28 DIAGNOSIS — J9611 Chronic respiratory failure with hypoxia: Secondary | ICD-10-CM

## 2022-04-28 DIAGNOSIS — D649 Anemia, unspecified: Secondary | ICD-10-CM

## 2022-04-28 DIAGNOSIS — I700 Atherosclerosis of aorta: Secondary | ICD-10-CM

## 2022-04-28 DIAGNOSIS — I10 Essential (primary) hypertension: Secondary | ICD-10-CM | POA: Diagnosis not present

## 2022-04-28 DIAGNOSIS — R9389 Abnormal findings on diagnostic imaging of other specified body structures: Secondary | ICD-10-CM | POA: Diagnosis not present

## 2022-04-28 DIAGNOSIS — E785 Hyperlipidemia, unspecified: Secondary | ICD-10-CM

## 2022-04-28 DIAGNOSIS — I5032 Chronic diastolic (congestive) heart failure: Secondary | ICD-10-CM

## 2022-04-28 DIAGNOSIS — K5900 Constipation, unspecified: Secondary | ICD-10-CM | POA: Diagnosis not present

## 2022-04-28 DIAGNOSIS — K5904 Chronic idiopathic constipation: Secondary | ICD-10-CM

## 2022-04-28 LAB — HIV ANTIBODY (ROUTINE TESTING W REFLEX): HIV Screen 4th Generation wRfx: NONREACTIVE

## 2022-04-28 LAB — GLUCOSE, CAPILLARY: Glucose-Capillary: 90 mg/dL (ref 70–99)

## 2022-04-28 MED ORDER — SENNOSIDES-DOCUSATE SODIUM 8.6-50 MG PO TABS
1.0000 | ORAL_TABLET | Freq: Two times a day (BID) | ORAL | Status: DC
  Administered 2022-04-28: 1 via ORAL
  Filled 2022-04-28: qty 1

## 2022-04-28 MED ORDER — BUTALBITAL-APAP-CAFFEINE 50-325-40 MG PO TABS
2.0000 | ORAL_TABLET | Freq: Four times a day (QID) | ORAL | Status: DC | PRN
Start: 2022-04-28 — End: 2022-04-28
  Administered 2022-04-28: 2 via ORAL
  Filled 2022-04-28: qty 2

## 2022-04-28 MED ORDER — SENNOSIDES-DOCUSATE SODIUM 8.6-50 MG PO TABS: 1.0000 | ORAL_TABLET | Freq: Two times a day (BID) | ORAL | 0 refills | Status: DC | PRN

## 2022-04-28 NOTE — TOC Progression Note (Signed)
Transition of Care [facility]) - Progression Note    Patient Details  Name: [name] MRN: [id number] Date of Birth: 11-28-1945  [facility] CM/SW Contact  Purcell Mouton, RN Phone Number: 04/28/2022, 10:52 AM  Clinical Narrative:     Pt states she will not need HH at discharge. Pt is very pleasant.   Expected Discharge Plan: Home/Self Care Barriers to Discharge: No Barriers Identified  Expected Discharge Plan and Services Expected Discharge Plan: Home/Self Care       Living arrangements for the past 2 months: Single Family Home Expected Discharge Date: 04/28/22                                     Social Determinants of Health (SDOH) Interventions    Readmission Risk Interventions    06/22/2021   10:26 AM  Readmission Risk Prevention Plan  Transportation Screening Complete  PCP or Specialist Appt within 3-5 Days Complete  HRI or [place] Complete  Social Work Consult for [place] Planning/Counseling Complete  Palliative Care Screening Not Applicable  Medication Review [employer]) Complete

## 2022-04-28 NOTE — Discharge Summary (Signed)
Physician Discharge Summary  [name] JJO:[id number] DOB: [birth year] DOA: 04/26/2022  PCP: [name], MD  Admit date: 04/26/2022 Discharge date: 04/28/2022 Admitted From: Home Disposition: Home Recommendations for Outpatient Follow-up:  Follow up with PCP in 1 week Follow-up with pulmonology in 2 to 3 weeks Repeat CT chest in 3 months this for follow-up on pulmonary nodules Please follow up on the following pending results: None  Home Health: Not required Equipment/Devices: Patient has appropriate DME  Discharge Condition: Stable CODE STATUS: Full code  Follow-up Information     [name], MD. Schedule an appointment as soon as possible for a visit in 1 week(s).   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  In ED, vitals stable.  Basic labs without significant finding.  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    Patient had large bowel movements after enema.  She was continued on home Linzess and lactulose, and had additional bowel movements.  She is discharged on home lactulose and Linzess with an option to take Senokot-S as needed.   In regards to CT findings of pulmonary nodules and possible chronic atypical infectious process, pulmonary follow-up and repeat CT in 3 months was recommended.  Other chronic comorbidities stable.  See individual problem list below for more.   Problems addressed during this hospitalization Principal Problem:    Obstipation Active Problems:   Chronic diastolic CHF (congestive heart failure) (HCC)   Chronic respiratory failure with hypoxia (HCC)   HTN (hypertension)   Chronic idiopathic constipation   Atherosclerosis of aorta (HCC)   Hyperlipidemia LDL goal <70   Normocytic anemia   Abnormal CT of the chest              Vital signs Vitals:   04/27/22 1400 04/27/22 1818 04/27/22 2218 04/28/22 0445  BP: 131/74 (!) 143/66 (!) 149/74 (!) 148/65  Pulse: 73 70 76 71  Temp: 97.6 F (36.4 C) 98.9 F (37.2 C) 99.1 F (37.3 C) 98.4 F (36.9 C)  Resp: '16 16 14 14  '$ Height: 5' 2.01" (1.575 m)     Weight: 50 kg   53.1 kg  SpO2: 96% 97% 96% 94%  TempSrc: Oral Oral Oral Oral  BMI (Calculated): 20.16   21.41     Discharge exam  GENERAL: No apparent distress.  Nontoxic. HEENT: MMM.  Vision and hearing grossly intact.  NECK: Supple.  No apparent JVD.  RESP:  No IWOB.  Fair aeration bilaterally. CVS:  RRR. Heart sounds normal.  ABD/GI/GU: BS+. Abd soft, NTND.  MSK/EXT:  Moves extremities. No apparent deformity. No edema.  SKIN: no apparent skin lesion or wound NEURO: Awake and alert. Oriented appropriately.  No apparent focal neuro deficit. PSYCH: Calm. Normal affect.   Discharge Instructions Discharge Instructions     Call MD for:  extreme fatigue   Complete by: As directed    Call MD for:  persistant nausea and vomiting   Complete by: As directed    Call MD  for:  severe uncontrolled pain   Complete by: As directed    Diet - low sodium heart healthy   Complete by: As directed    Discharge instructions   Complete by: As directed    It has been a pleasure taking care of you!  You were hospitalized due to constipation that seems to have resolved.  Continue using the Linzess.  You may also use Senokot-S and lactulose as needed.  Follow-up with your primary care doctor in 1 to 2 weeks or sooner if needed.  CT of the chest and abdomen also showed some lung nodules for which repeat CT  chest is recommended in 3 months.   Take care,   Increase activity slowly   Complete by: As directed       Allergies as of 04/28/2022       Reactions   Ceftriaxone Anaphylaxis, Swelling, Other (See Comments)   *ROCEPHIN* - "Blew up like a balloon"   Hydroxyzine Shortness Of Breath, Other (See Comments)   Pt states med make her light headed, get sob sxs   Other Shortness Of Breath, Other (See Comments)   Unnamed antibiotic for an ear infection- Extreme dizziness, also   Doxycycline Nausea And Vomiting   Lexapro [escitalopram] Other (See Comments)   Pt states med make her dizzy        Medication List     STOP taking these medications    budesonide-formoterol 160-4.5 MCG/ACT inhaler Commonly known as: SYMBICORT   ciprofloxacin 500 MG tablet Commonly known as: Cipro   ofloxacin 0.3 % OTIC solution Commonly known as: FLOXIN   Trelegy Ellipta 100-62.5-25 MCG/ACT Aepb Generic drug: Fluticasone-Umeclidin-Vilant       TAKE these medications    ALPRAZolam 1 MG tablet Commonly known as: XANAX TAKE 1 TABLET BY MOUTH 3 TIMES DAILY AS NEEDED FOR ANXIETY.   ascorbic acid 500 MG tablet Commonly known as: VITAMIN C Take 500 mg by mouth daily.   aspirin EC 325 MG tablet Take 325 mg by mouth in the morning.   atorvastatin 10 MG tablet Commonly known as: LIPITOR Take 10 mg by mouth daily.   Atrovent HFA 17 MCG/ACT inhaler Generic drug: ipratropium INHALE 2 PUFFS INTO THE LUNGS EVERY 4 HOURS AS NEEDED What changed: See the new instructions.   butalbital-acetaminophen-caffeine 50-325-40 MG tablet Commonly known as: FIORICET TAKE 1 TABLET BY MOUTH EVERY 4 HOURS AS NEEDED FOR HEADACHE. What changed: See the new instructions.   cyanocobalamin 1000 MCG tablet Commonly known as: VITAMIN B12 Take 1 tablet (1,000 mcg total) by mouth daily.   diclofenac Sodium 1 % Gel Commonly known as: VOLTAREN APPLY 2 GRAMS TO AFFECTED AREA 4 TIMES A DAY What changed: See the new  instructions.   Enulose 10 GM/15ML Soln Generic drug: lactulose (encephalopathy) Take 45 mLs (30 g total) by mouth 3 (three) times daily as needed (constipation). What changed:  when to take this reasons to take this   furosemide 40 MG tablet Commonly known as: LASIX TAKE 1 TABLET BY MOUTH TWICE A DAY What changed: when to take this   ipratropium-albuterol 0.5-2.5 (3) MG/3ML Soln Commonly known as: DUONEB INHALE 3 MLS INTO THE LUNGS EVERY 6 HOURS AS NEEDED (FOR FOR SHORTNESS OF BREATH [place]). What changed: See the new instructions.   Klor-Con M20 20 MEQ tablet Generic drug: potassium chloride SA TAKE 1 TABLET BY MOUTH EVERY DAY What changed: how much to take   levocetirizine 5 MG tablet Commonly known as: XYZAL  TAKE 1 TABLET BY MOUTH EVERY DAY IN THE EVENING What changed:  how much to take how to take this when to take this   Linzess 72 MCG capsule Generic drug: linaclotide TAKE 1 CAPSULE BY MOUTH EVERY DAY BEFORE BREAKFAST What changed: See the new instructions.   Livalo 2 MG Tabs Generic drug: Pitavastatin Calcium TAKE 1 TABLET BY MOUTH EVERY DAY What changed:  how much to take when to take this   meclizine 25 MG tablet Commonly known as: ANTIVERT Take 12.5-25 mg by mouth 3 (three) times daily as needed for dizziness.   mometasone-formoterol 200-5 MCG/ACT Aero Commonly known as: DULERA Inhale 2 puffs into the lungs 2 (two) times daily.   montelukast 10 MG tablet Commonly known as: SINGULAIR TAKE 1 TABLET BY MOUTH EVERYDAY AT BEDTIME What changed: See the new instructions.   nortriptyline 25 MG capsule Commonly known as: PAMELOR TAKE 2 CAPSULES BY MOUTH AT BEDTIME   Omega 3 1200 MG Caps Take 1,200 mg by mouth every morning.   OXYGEN Inhale 3 L/min into the lungs See admin instructions. 3 L/min at bedtime and during the day as needed for shortness of breath   pantoprazole 40 MG tablet Commonly known as: PROTONIX TAKE 1 TABLET BY MOUTH  EVERY DAY What changed: when to take this   polyvinyl alcohol 1.4 % ophthalmic solution Commonly known as: LIQUIFILM TEARS Place 1 drop into both eyes 3 (three) times daily as needed for dry eyes.   senna-docusate 8.6-50 MG tablet Commonly known as: Senokot-S Take 1 tablet by mouth 2 (two) times daily between meals as needed for moderate constipation.   Ventolin HFA 108 (90 Base) MCG/ACT inhaler Generic drug: albuterol INHALE 1-2 PUFFS BY MOUTH EVERY 6 HOURS AS NEEDED FOR WHEEZE OR SHORTNESS OF BREATH What changed: See the new instructions.   Vitamin D-3 25 MCG (1000 UT) Caps Take 1,000 Units by mouth daily.        Consultations: None  Procedures/Studies:   CT Chest Wo Contrast  Result Date: 04/27/2022 CLINICAL DATA:  Breast cancer. Abnormal lung bases on CT abdomen study performed earlier today. * Tracking Code: BO * EXAM: CT CHEST WITHOUT CONTRAST TECHNIQUE: Multidetector CT imaging of the chest was performed following the standard protocol without IV contrast. RADIATION DOSE REDUCTION: This exam was performed according to the departmental dose-optimization program which includes automated exposure control, adjustment of the mA and/or kV according to patient size and/or use of iterative reconstruction technique. COMPARISON:  04/27/2022 CT abdomen/pelvis. 04/09/2022 chest radiograph. 01/01/2015 chest CT. FINDINGS: Cardiovascular: Normal heart size. No significant pericardial effusion/thickening. Three-vessel coronary atherosclerosis. Atherosclerotic nonaneurysmal thoracic aorta. Top-normal caliber main pulmonary artery (3.3 cm diameter). Mediastinum/Nodes: No discrete thyroid nodules. Unremarkable esophagus. Surgical clips again noted in the right axilla. No axillary adenopathy. Mildly enlarged 1.2 cm short axis diameter lower right paratracheal node (series 3/image 51), stable since 01/01/2015 CT. No additional pathologically enlarged mediastinal nodes. No discrete hilar adenopathy on  these noncontrast images. Lungs/Pleura: No pneumothorax. No pleural effusion. Sharply marginated consolidation in the anterior mid to upper right lung with associated volume loss, mild bronchiectasis and distortion, unchanged and compatible with radiation fibrosis. No acute consolidative airspace disease or lung masses. Scattered regions of mild cylindrical bronchiolectasis throughout both lungs with associated mild patchy tree-in-bud opacity, most prominent in the left upper and left lower lobes, increased from 01/01/2015 chest CT. A few new indistinct solid pulmonary nodules in both lungs, largest 0.7 cm in the left lower lobe (series 2/image 82).  Peripheral left lower lobe indistinct 0.5 cm nodule (series 2/image 107) is stable since 01/01/2015 chest CT. Upper abdomen: Cholecystectomy. Musculoskeletal: No aggressive appearing focal osseous lesions. Moderate thoracic spondylosis. IMPRESSION: 1. Scattered regions of mild cylindrical bronchiolectasis with associated mild patchy tree-in-bud opacity, most prominent in the left upper and left lower lobes, increased from 01/01/2015 chest CT. 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 2. A few new indistinct solid pulmonary nodules in both lungs, largest 0.7 cm in the left lower lobe, nonspecific and potentially due to above suspected chronic infectious/inflammatory process. Recommend attention on follow-up noncontrast chest CT in 3 months given the history of breast cancer. 3. Chronic mild mediastinal lymphadenopathy, unchanged since 2016, presumably reactive. 4. 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 5. Three-vessel coronary atherosclerosis. 6. Aortic Atherosclerosis (ICD10-I70.0). Electronically Signed   By: Ilona Sorrel M.D.   On: 04/27/2022 08:26   CT ABDOMEN PELVIS W CONTRAST  Result Date: 04/27/2022 CLINICAL DATA:  76 year old female with history of blunt  trauma. Constipation for 6 days after taking antibiotics. EXAM: CT ABDOMEN AND PELVIS WITH CONTRAST TECHNIQUE: Multidetector CT imaging of the abdomen and pelvis was performed using the standard protocol following bolus administration of intravenous contrast. RADIATION DOSE REDUCTION: This exam was performed according to the departmental dose-optimization program which includes automated exposure control, adjustment of the mA and/or kV according to patient size and/or use of iterative reconstruction technique. CONTRAST:  20m OMNIPAQUE IOHEXOL 300 MG/ML  SOLN COMPARISON:  CT of the abdomen and pelvis 04/22/2020. FINDINGS: Lower chest: Scattered areas of bronchial wall thickening, thickening of the peribronchovascular interstitium, peribronchovascular micro nodularity and regional architectural distortion noted in the lung bases, similar to but slightly increased compared to the prior study, likely reflective of a chronic indolent atypical infectious process with areas of mucoid impaction within terminal bronchioles. The largest of these small pulmonary nodules measures 6 x 4 mm (mean diameter of 5 mm) in the periphery of the left lower lobe (axial image 34 of series 3), similar to the prior study. Atherosclerotic calcifications are noted in the descending thoracic aorta as well as the right coronary artery. Severe calcifications of the aortic valve. Hepatobiliary: No suspicious cystic or solid hepatic lesions. No intra or extrahepatic biliary ductal dilatation. Status post cholecystectomy. Pancreas: No pancreatic mass. No pancreatic ductal dilatation. No pancreatic or peripancreatic fluid collections or inflammatory changes. Spleen: Unremarkable. Adrenals/Urinary Tract: 1.8 cm exophytic low-attenuation lesion in the anterior aspect of the interpolar region of the right kidney is compatible with a small simple cyst, as is an exophytic 1.2 cm lesion in the lateral aspect of the interpolar region of the left kidney (no  imaging follow-up is recommended). No suspicious renal lesions are noted. Cortical scarring in the posterior aspect of the left kidney incidentally noted. No hydroureteronephrosis. Urinary bladder is normal in appearance. Bilateral adrenal glands are normal in appearance. Stomach/Bowel: The appearance of the stomach is unremarkable. There is no pathologic dilatation of small bowel or colon. Large volume of stool noted throughout the colon and rectum. The appendix is not confidently identified and may be surgically absent. Regardless, there are no inflammatory changes noted adjacent to the cecum to suggest the presence of an acute appendicitis at this time. Infraumbilical ventral hernia containing several loops of mid to distal small bowel, similar to the prior examination. Vascular/Lymphatic: Aortic atherosclerosis with mild fusiform ectasia of the infrarenal abdominal aorta which measures up to 2.5 x 2.4  cm. No aneurysm or dissection noted in the abdominal or pelvic vasculature. No lymphadenopathy noted in the abdomen or pelvis. Reproductive: Uterus and ovaries are unremarkable in appearance. Other: No significant volume of ascites.  No pneumoperitoneum. Musculoskeletal: There are no aggressive appearing lytic or blastic lesions noted in the visualized portions of the skeleton. IMPRESSION: 1. No acute findings are noted in the abdomen or pelvis. 2. Moderate-sized infraumbilical ventral hernia containing several loops of mid to distal small bowel, without associated bowel incarceration or obstruction at this time. 3. Large volume of stool throughout the colon and rectum, compatible with reported clinical history of constipation. 4. 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 Multiple small nodules in this region likely reflect areas of chronic mucoid impaction within terminal bronchioles. Outpatient referral to  Pulmonology for further clinical evaluation is recommended. Follow-up nonemergent outpatient high-resolution chest CT should also be considered to better evaluate these findings. 5. Aortic atherosclerosis, in addition to at least right coronary artery disease. Assessment for potential risk factor modification, dietary therapy or pharmacologic therapy may be warranted, if clinically indicated. 6. There are severe calcifications of the aortic valve. Echocardiographic correlation for evaluation of potential valvular dysfunction may be warranted if clinically indicated. Electronically Signed   By: Vinnie Langton M.D.   On: 04/27/2022 05:53   MR BRAIN W WO CONTRAST  Result Date: 04/09/2022 CLINICAL DATA:  Initial evaluation for dizziness. EXAM: MRI HEAD WITHOUT AND WITH CONTRAST TECHNIQUE: Multiplanar, multiecho pulse sequences of the brain and surrounding structures were obtained without and with intravenous contrast. CONTRAST:  80m GADAVIST GADOBUTROL 1 MMOL/ML IV SOLN COMPARISON:  Prior CT from earlier the same day. FINDINGS: Brain: Cerebral volume within normal limits. Moderate chronic microvascular ischemic disease noted involving the periventricular deep white matter both cerebral hemispheres as well as the pons. No evidence for acute or subacute ischemia. Gray-white matter differentiation maintained. No areas of chronic cortical infarction. No acute or chronic intracranial blood products. No mass lesion, midline shift or mass effect. No hydrocephalus or extra-axial fluid collection. Pituitary gland suprasellar region normal. No abnormal enhancement. No evidence for intracranial spread of infection. Vascular: Major intracranial vascular flow voids are maintained. Skull and upper cervical spine: Craniocervical junction normal. Bone marrow signal intensity within normal limits. No scalp soft tissue abnormality. Sinuses/Orbits: Prior bilateral ocular lens replacement. Mild scattered mucosal thickening noted  about the ethmoidal air cells. Paranasal sinuses are otherwise clear. Other: Right mastoid and middle ear effusion with associated enhancement. Findings better characterized on prior temporal bone CT. IMPRESSION: 1. No acute intracranial abnormality. 2. Moderate chronic microvascular ischemic disease. 3. Right mastoid and middle ear effusion, better characterized on prior temporal bone CT. Electronically Signed   By: BJeannine BogaM.D.   On: 04/09/2022 23:05   CT Temporal Bones W Contrast  Result Date: 04/09/2022 CLINICAL DATA:  Right-sided mastoiditis. Your infection. EXAM: CT TEMPORAL BONES WITH CONTRAST TECHNIQUE: Axial and coronal plane CT imaging of the petrous temporal bones was performed with thin-collimation image reconstruction following intravenous contrast administration. Multiplanar CT image reconstructions were also generated. RADIATION DOSE REDUCTION: This exam was performed according to the departmental dose-optimization program which includes automated exposure control, adjustment of the mA and/or kV according to patient size and/or use of iterative reconstruction technique. CONTRAST:  725mOMNIPAQUE IOHEXOL 300 MG/ML  SOLN COMPARISON:  None Available. FINDINGS: RIGHT TEMPORAL BONE External auditory canal: Right external auditory canal is opacified medially. Erosive changes  are present along the posterior and superior aspect of the canal. Middle ear cavity: The middle ear cavity is opacified. Ossicles are within normal limits. Fluid or soft tissue extends into the epitympanum. There is some blunting of the scutum. Inner ear structures: The cochlea, vestibule and semicircular canals are normal. The vestibular aqueduct is not enlarged. Internal auditory and facial nerve canals:  Normal Mastoid air cells: Mastoid air cells are diffusely opacified there is some coalescence of cells. Cortex is breech posteriorly adjacent to the sigmoid sinus. LEFT TEMPORAL BONE External auditory canal: Cerumen  is present in the medial canal. The external auditory canal is otherwise within normal limits. Middle ear cavity: Normally aerated. The scutum and ossicles are normal. The tegmen tympani is intact. Inner ear structures: The cochlea, vestibule and semicircular canals are normal. The vestibular aqueduct is not enlarged. Internal auditory and facial nerve canals:  Normal. Mastoid air cells: Normally aerated. No osseous erosion. Vascular: Normal appearance of the carotid canals, jugular bulbs and sigmoid plates. The dural sinuses are patent. Limited intracranial:  Within normal limits. Visible orbits/paranasal sinuses: Bilateral lens replacements are noted. Globes and orbits are otherwise unremarkable. Paranasal sinuses are clear. Soft tissues: Soft tissue enhancement is noted along the lateral inferior aspect of the right mastoid. No abscess is present. IMPRESSION: 1. Right middle ear and mastoid disease with some coalescence of cells and soft tissue along the lateral inferior aspect of the mastoid compatible with acute mastoiditis. 2. Erosive changes along the posterior and superior aspect of the right external auditory canal consistent with aggressive otitis externa. 3. Erosive changes of the right scutum suggesting a chronic process. 4. The posterior right mastoid air cells are eroded without enhancement along the dura 5. Normal appearance of the left temporal bone. Electronically Signed   By: San Morelle M.D.   On: 04/09/2022 20:21   DG Chest Port 1 View  Result Date: 04/09/2022 CLINICAL DATA:  Shortness of breath EXAM: PORTABLE CHEST 1 VIEW COMPARISON:  03/21/2022 FINDINGS: Transverse diameter of the heart is increased. There are no signs of alveolar pulmonary edema or new focal infiltrates. There is no pleural effusion or pneumothorax. There is possible skin fold overlying the lateral aspect of the right lower lung field. Surgical clips are seen in right chest wall. IMPRESSION: There are no signs of  pulmonary edema or new focal infiltrates. Electronically Signed   By: Elmer Picker M.D.   On: 04/09/2022 20:06       The results of significant diagnostics from this hospitalization (including imaging, microbiology, ancillary and laboratory) are listed below for reference.     Microbiology: Recent Results (from the past 240 hour(s))  Expectorated Sputum Assessment w Gram Stain, Rflx to Resp Cult     Status: None   Collection Time: 04/27/22 12:22 PM   Specimen: Sputum  Result Value Ref Range Status   Specimen Description SPUTUM  Final   Special Requests NONE  Final   Sputum evaluation   Final    THIS SPECIMEN IS ACCEPTABLE FOR SPUTUM CULTURE Performed at [facility], [place] [address] [place], [place] [address]    Report Status 04/27/2022 FINAL  Final  Culture, Respiratory w Gram Stain     Status: None (Preliminary result)   Collection Time: 04/27/22 12:22 PM   Specimen: SPU  Result Value Ref Range Status   Specimen Description   Final    SPUTUM Performed at [facility] [address] [place], [place] [address]  Special Requests   Final    NONE Reflexed from [id number] Performed at [facility], [place] [address], [place], [place] [address]    Gram Stain   Final    NO SQUAMOUS EPITHELIAL CELLS SEEN FEW WBC SEEN RARE GRAM POSITIVE COCCI    Culture   Final    TOO YOUNG TO READ Performed at [facility], [place] [address], [place], [place] [address]    Report Status PENDING  Incomplete  Blood culture (routine x 2)     Status: None (Preliminary result)   Collection Time: 04/27/22  3:02 PM   Specimen: BLOOD  Result Value Ref Range Status   Specimen Description   Final    BLOOD BLOOD LEFT FOREARM Performed at [facility] [address] [place] [place], [place] [address]    Special Requests   Final    IN PEDIATRIC BOTTLE Blood Culture adequate volume Performed at [facility] [address] [place], [place] [address]    Culture   Final    NO GROWTH < 24 HOURS Performed at [facility] [address], [place], [place] [address]    Report Status PENDING  Incomplete  Blood culture (routine x 2)     Status: None (Preliminary result)   Collection Time: 04/27/22  3:04 PM   Specimen: BLOOD  Result Value Ref Range Status   Specimen Description   Final    BLOOD BLOOD LEFT WRIST Performed at [facility] [address], [place], [place] [address]    Special Requests   Final    IN PEDIATRIC BOTTLE Blood Culture adequate volume Performed at [facility] [address], [place], [place] [address]    Culture   Final    NO GROWTH < 24 HOURS Performed at [facility] [address] [place], [place] [address]    Report Status PENDING  Incomplete     Labs:  CBC: Recent Labs  Lab 04/26/22 2058  WBC 7.6  NEUTROABS 6.0  HGB 10.9*  HCT 36.1  MCV 91.9  PLT 334   BMP &GFR Recent Labs  Lab 04/26/22 2058  NA 135  K 4.2  CL 87*  CO2 35*  GLUCOSE 92  BUN 18  CREATININE 0.94  CALCIUM 10.3   Estimated Creatinine Clearance: 40.3 mL/min (by C-G formula based on SCr of 0.94 mg/dL). Liver & Pancreas: Recent Labs  Lab 04/26/22 2058  AST 23  ALT 14  ALKPHOS 48  BILITOT 0.5  PROT 7.5  ALBUMIN 3.8   Recent Labs  Lab 04/26/22 2058  LIPASE 40   No results for input(s): "AMMONIA" in the last 168 hours. Diabetic: No results for input(s): "HGBA1C" in the last 72 hours. Recent Labs  Lab 04/28/22 0447  GLUCAP 90   Cardiac Enzymes: No results for input(s): "CKTOTAL", "CKMB", "CKMBINDEX", "TROPONINI" in the last 168 hours. No results for input(s): "PROBNP" in the last 8760 hours. Coagulation Profile: No results for input(s): "INR", "PROTIME" in the last 168 hours. Thyroid Function Tests: No results for input(s): "TSH", "T4TOTAL", "FREET4", "T3FREE", "THYROIDAB" in the last 72 hours. Lipid  Profile: No results for input(s): "CHOL", "HDL", "LDLCALC", "TRIG", "CHOLHDL", "LDLDIRECT" in the last 72 hours. Anemia Panel: No results for input(s): "VITAMINB12", "FOLATE", "FERRITIN", "TIBC", "IRON", "RETICCTPCT" in the last 72 hours. Urine analysis:    Component Value Date/Time   COLORURINE YELLOW 04/26/2022 2058   APPEARANCEUR CLEAR 04/26/2022 2058  LABSPEC 1.011 04/26/2022 2058   PHURINE 7.0 04/26/2022 2058   GLUCOSEU NEGATIVE 04/26/2022 2058   GLUCOSEU Negative 03/05/2022 1023   HGBUR NEGATIVE 04/26/2022 2058   BILIRUBINUR NEGATIVE 04/26/2022 2058   KETONESUR NEGATIVE 04/26/2022 2058   PROTEINUR NEGATIVE 04/26/2022 2058   UROBILINOGEN 0.2 mg/dL (A) 03/05/2022 1023   NITRITE NEGATIVE 04/26/2022 2058   LEUKOCYTESUR NEGATIVE 04/26/2022 2058   Sepsis Labs: Invalid input(s): "PROCALCITONIN", "LACTICIDVEN"   SIGNED:  Mercy Riding, MD  [facility] 04/28/2022, 9:43 PM

## 2022-04-28 NOTE — Care Management CC44 (Signed)
Condition Code 44 Documentation Completed  Patient Details  Name: [name] MRN: [id number] Date of Birth: 11-24-1945   Condition Code 44 given:  Yes Patient signature on Condition Code 44 notice:  Yes Documentation of 2 MD's agreement:  Yes Code 44 added to claim:  Yes    Calob Baskette, RN 04/28/2022, 10:14 AM

## 2022-04-29 ENCOUNTER — Other Ambulatory Visit: Payer: Self-pay | Admitting: Internal Medicine

## 2022-04-29 DIAGNOSIS — J449 Chronic obstructive pulmonary disease, unspecified: Secondary | ICD-10-CM

## 2022-04-30 LAB — CULTURE, RESPIRATORY W GRAM STAIN
Culture: NORMAL
Gram Stain: NONE SEEN

## 2022-05-02 LAB — CULTURE, BLOOD (ROUTINE X 2)
Culture: NO GROWTH
Culture: NO GROWTH
Special Requests: ADEQUATE
Special Requests: ADEQUATE

## 2022-05-03 ENCOUNTER — Other Ambulatory Visit: Payer: Self-pay | Admitting: Internal Medicine

## 2022-05-03 DIAGNOSIS — I10 Essential (primary) hypertension: Secondary | ICD-10-CM

## 2022-05-08 ENCOUNTER — Other Ambulatory Visit: Payer: Self-pay | Admitting: Internal Medicine

## 2022-05-08 DIAGNOSIS — I2510 Atherosclerotic heart disease of native coronary artery without angina pectoris: Secondary | ICD-10-CM

## 2022-05-08 DIAGNOSIS — E785 Hyperlipidemia, unspecified: Secondary | ICD-10-CM

## 2022-05-08 DIAGNOSIS — K5904 Chronic idiopathic constipation: Secondary | ICD-10-CM

## 2022-05-08 DIAGNOSIS — M159 Polyosteoarthritis, unspecified: Secondary | ICD-10-CM

## 2022-05-10 ENCOUNTER — Ambulatory Visit (HOSPITAL_COMMUNITY): Payer: Medicare Other | Attending: Cardiology

## 2022-05-10 ENCOUNTER — Encounter: Payer: Self-pay | Admitting: Internal Medicine

## 2022-05-10 ENCOUNTER — Ambulatory Visit (INDEPENDENT_AMBULATORY_CARE_PROVIDER_SITE_OTHER): Payer: Medicare Other | Admitting: Otolaryngology

## 2022-05-10 DIAGNOSIS — I350 Nonrheumatic aortic (valve) stenosis: Secondary | ICD-10-CM

## 2022-05-10 DIAGNOSIS — H6521 Chronic serous otitis media, right ear: Secondary | ICD-10-CM

## 2022-05-10 LAB — ECHOCARDIOGRAM COMPLETE
AR max vel: 0.72 cm2
AV Area VTI: 0.81 cm2
AV Area mean vel: 0.76 cm2
AV Mean grad: 30.7 mmHg
AV Peak grad: 56 mmHg
Ao pk vel: 3.74 m/s
Area-P 1/2: 2.81 cm2
S' Lateral: 2.6 cm

## 2022-05-10 NOTE — Progress Notes (Signed)
[name] is an 76 y.o. female.   Chief Complaint: decreased hearing HPI: she has previous foreign body and fluid in th eright ear. She now has no pain and hearing is much better since the tube. No vertigo. No drainage  Past Medical History:  Diagnosis Date   Abnormal LFTs 08/02/2011   Acute liver failure 06/19/2013   Acute renal failure ([place]) 06/19/2013   Acute respiratory failure ([place]) 06/19/2013   Altered mental status 08/01/2011   Angina    Anxiety    Anxiety state, unspecified 12/03/2013   Aortic stenosis    mild   Arthritis    "hands" (02/03/2017)   Breast cancer, right breast ([place]) dx'd 2008   CAD (coronary artery disease) of artery bypass graft 11/06/2013   CAD (coronary artery disease), MI R/O 08/01/2011   PCI in 2006 (bare metal stent, unknown artery) - [place]    CAP (community acquired pneumonia) 09/11/2018   CHF,  acute diastolic, BNP 4k on admissio 08/01/2011   Chronic bronchitis (HCC)    Chronic respiratory failure ([place]) 02/02/2012   Compression fracture of L1 lumbar vertebra ([place]) 02/02/2012   COPD (chronic obstructive pulmonary disease) ([place])    oxygen-dependent 4LPM [place]   Coronary artery disease 2006   2 stents w/previous MI   Depressed    Diastolic CHF ([place])    Dyslipidemia    Family history of adverse reaction to anesthesia    "daughter had c-section; missed twice w/epidural"   GERD (gastroesophageal reflux disease)    Heart murmur    History of blood transfusion    "w/my colon OR"   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   History of nuclear stress test 08/03/2011   attenuation at apex - no perfusion defects    HTN (hypertension) 11/06/2013   Hyperkalemia, on ACE prior to admission 11/11/2011   Hypertension    Hyponatremia 01/31/2012   Migraine    "qod to q couple months since I was 21" (02/03/2017)   Mild aortic stenosis 08/01/2011   AVA 1.69 cm2 (06/02/2011)    Moderate to severe pulmonary hypertension ([place])    NSTEMI (non-ST elevated myocardial infarction) ([place])  2006   NSVT (nonsustained ventricular tachycardia) ([place])    h/o   On home oxygen therapy    "3L just at night; have it available prn duringtheday" (09/11/2018)   Pneumonia    "alot of times" (02/03/2017)   SBO (small bowel obstruction) ([place]) 04/23/2020   PARTIAL     Past Surgical History:  Procedure Laterality Date   BREAST BIOPSY Right 2008   BREAST LUMPECTOMY Right 2008   malignant   CATARACT EXTRACTION W/ INTRAOCULAR LENS  IMPLANT, BILATERAL Bilateral ~ 2013   [place]; 1971; [place]  11/2007   COLOSTOMY CLOSURE  07/2008   CORONARY ANGIOPLASTY WITH STENT PLACEMENT  2006   "2 stents"   ERCP N/A 09/05/2015   Procedure: ENDOSCOPIC RETROGRADE CHOLANGIOPANCREATOGRAPHY (ERCP);  Surgeon: Ladene Artist, MD;  Location: [facility];  Service: Endoscopy;  Laterality: N/A;   MYRINGOTOMY WITH TUBE PLACEMENT Right 04/11/2022   Procedure: TYMPANOSTOMY TUBE RIGHT EAR, EAR CLEANING AND REMOVAL OF FOREIGN BODIES;  Surgeon: Melissa Montane, MD;  Location: [facility];  Service: ENT;  Laterality: Right;   PARTIAL COLECTOMY  2009   for obstruction: temporary ostomy, later reversed.    RIGHT HEART CATHETERIZATION N/A 09/05/2013   Procedure: RIGHT HEART CATH;  Surgeon:  Jolaine Artist, MD;  [facility];  Service: Cardiovascular;  Laterality: N/A;   TRANSTHORACIC ECHOCARDIOGRAM  11/12/2011   EF 25-85%, normal systolic function, grade 1 diastolic dysfunction; ventricular septal flattening (D-sign); mild AS; trace-mild MR; LA mildly dilated; RV mod dilated; RA mod dilated; severe pulm HTN; elevated CVP    Family History  Problem Relation Age of Onset   Alzheimer's disease Mother    Schizophrenia Sister    Breast cancer Sister        74s   Heart disease Sister    Diabetes Sister    Hyperlipidemia Brother    Cancer Neg Hx    Stroke Neg Hx    COPD Neg Hx    Depression Neg Hx    Drug abuse Neg Hx    Early death Neg Hx     Hypertension Neg Hx    Kidney disease Neg Hx    Social History:  reports that she has been smoking cigarettes. She has a 26.50 pack-year smoking history. She has never used smokeless tobacco. She reports current alcohol use of about 1.0 standard drink of alcohol per week. She reports that she does not use drugs.  Allergies:  Allergies  Allergen Reactions   Ceftriaxone Anaphylaxis, Swelling and Other (See Comments)    *ROCEPHIN* - "Blew up like a balloon"   Hydroxyzine Shortness Of Breath and Other (See Comments)    Pt states med make her light headed, get sob sxs   Other Shortness Of Breath and Other (See Comments)    Unnamed antibiotic for an ear infection- Extreme dizziness, also   Doxycycline Nausea And Vomiting   Lexapro [Escitalopram] Other (See Comments)    Pt states med make her dizzy    (Not in a hospital admission)   No results found for this or any previous visit (from the past 48 hour(s)). No results found.   There were no vitals taken for this visit.  PHYSICAL EXAM: Appearance _ awake alert with no distress.  Head- atraumatic and no obvious abnormalities Eyes- PERRL, EOMI, no conjunctiva injection or ecchymosis Ears-  Right- Pinna without inflammation or swelling. There is slight debris but TM easily seen. It has a thickened appearance and there could be an effusion that is not purulent. The perforation has closed.   Left- Pinna without inflammation or swelling. canal without obstruction or injury. TM within normal limits Oc/OP- no lesions or excessive swelling. Mouth opening normal.  Hp/Larynx- normal voice and no airway issues. No swelling or lesions Neck- no mass or lesions. Normal movement.  Neuro- CNII-XII intact, no sensory deficits.  Lungs- normal effort no distress noted     Assessment/Plan Right ETD- She has better hearing then previous but still with a slight difference between sides. She may still have some effusion since the perforation has closed.  She needs an audiogram and further care as indicated by audio. I will refer her to an outpatient ENT that can continue her care for the effusion and mastoid as well as get sudiogram. Her daughter and the patient agree. She will stop the drops.   Melissa Montane 05/10/2022, 11:44 AM

## 2022-05-12 ENCOUNTER — Other Ambulatory Visit: Payer: Self-pay | Admitting: Internal Medicine

## 2022-05-12 ENCOUNTER — Telehealth: Payer: Self-pay

## 2022-05-12 NOTE — Telephone Encounter (Signed)
Called pt to make aware of prolia coming in, pt sated she know and reverify her up coming appointment on the 05/19/22

## 2022-05-19 ENCOUNTER — Ambulatory Visit: Payer: Medicare Other

## 2022-05-19 ENCOUNTER — Encounter: Payer: Self-pay | Admitting: Internal Medicine

## 2022-06-01 ENCOUNTER — Other Ambulatory Visit: Payer: Self-pay | Admitting: Internal Medicine

## 2022-06-01 ENCOUNTER — Ambulatory Visit: Payer: Medicare Other

## 2022-06-01 ENCOUNTER — Ambulatory Visit (INDEPENDENT_AMBULATORY_CARE_PROVIDER_SITE_OTHER): Payer: Medicare Other | Admitting: *Deleted

## 2022-06-01 DIAGNOSIS — F409 Phobic anxiety disorder, unspecified: Secondary | ICD-10-CM

## 2022-06-01 DIAGNOSIS — M810 Age-related osteoporosis without current pathological fracture: Secondary | ICD-10-CM

## 2022-06-01 DIAGNOSIS — J449 Chronic obstructive pulmonary disease, unspecified: Secondary | ICD-10-CM

## 2022-06-01 MED ORDER — ATROVENT HFA 17 MCG/ACT IN AERS: 2.0000 | INHALATION_SPRAY | RESPIRATORY_TRACT | 1 refills | Status: DC | PRN

## 2022-06-01 MED ORDER — DENOSUMAB 60 MG/ML ~~LOC~~ SOSY
60.0000 mg | PREFILLED_SYRINGE | Freq: Once | SUBCUTANEOUS | Status: AC
  Administered 2022-06-01: 60 mg via SUBCUTANEOUS

## 2022-06-01 NOTE — Progress Notes (Signed)
Pls cosign for prolia inj../lmb 

## 2022-06-02 NOTE — Telephone Encounter (Signed)
Last [name] inj 06/01/22 Next [name] inj due 12/01/22 

## 2022-06-11 ENCOUNTER — Other Ambulatory Visit: Payer: Self-pay | Admitting: Internal Medicine

## 2022-06-11 DIAGNOSIS — G43009 Migraine without aura, not intractable, without status migrainosus: Secondary | ICD-10-CM

## 2022-06-16 ENCOUNTER — Other Ambulatory Visit: Payer: Self-pay | Admitting: Internal Medicine

## 2022-06-16 DIAGNOSIS — Z1231 Encounter for screening mammogram for malignant neoplasm of breast: Secondary | ICD-10-CM

## 2022-06-21 ENCOUNTER — Ambulatory Visit: Payer: Medicare Other | Admitting: Internal Medicine

## 2022-06-21 ENCOUNTER — Encounter: Payer: Self-pay | Admitting: Internal Medicine

## 2022-06-27 ENCOUNTER — Other Ambulatory Visit: Payer: Self-pay | Admitting: Internal Medicine

## 2022-06-27 DIAGNOSIS — D510 Vitamin B12 deficiency anemia due to intrinsic factor deficiency: Secondary | ICD-10-CM

## 2022-07-06 LAB — LIPID PANEL
Chol/HDL Ratio: 2.2 ratio (ref 0.0–4.4)
Cholesterol, Total: 214 mg/dL — ABNORMAL HIGH (ref 100–199)
HDL: 98 mg/dL
LDL Chol Calc (NIH): 99 mg/dL (ref 0–99)
Triglycerides: 102 mg/dL (ref 0–149)
VLDL Cholesterol Cal: 17 mg/dL (ref 5–40)

## 2022-07-07 ENCOUNTER — Other Ambulatory Visit: Payer: Self-pay | Admitting: Internal Medicine

## 2022-07-07 DIAGNOSIS — J438 Other emphysema: Secondary | ICD-10-CM

## 2022-07-07 DIAGNOSIS — M159 Polyosteoarthritis, unspecified: Secondary | ICD-10-CM

## 2022-07-07 MED ORDER — IPRATROPIUM-ALBUTEROL 0.5-2.5 (3) MG/3ML IN SOLN: 3.0000 mL | RESPIRATORY_TRACT | 1 refills | Status: DC | PRN

## 2022-07-15 ENCOUNTER — Encounter: Payer: Self-pay | Admitting: Internal Medicine

## 2022-07-20 ENCOUNTER — Encounter: Payer: Self-pay | Admitting: Internal Medicine

## 2022-07-20 ENCOUNTER — Ambulatory Visit (INDEPENDENT_AMBULATORY_CARE_PROVIDER_SITE_OTHER): Payer: Medicare Other | Admitting: Internal Medicine

## 2022-07-20 VITALS — BP 116/62 | HR 62 | Temp 97.6°F | Ht 62.0 in | Wt 107.0 lb

## 2022-07-20 DIAGNOSIS — Z23 Encounter for immunization: Secondary | ICD-10-CM

## 2022-07-20 DIAGNOSIS — E611 Iron deficiency: Secondary | ICD-10-CM | POA: Diagnosis not present

## 2022-07-20 DIAGNOSIS — D510 Vitamin B12 deficiency anemia due to intrinsic factor deficiency: Secondary | ICD-10-CM | POA: Diagnosis not present

## 2022-07-20 DIAGNOSIS — I2510 Atherosclerotic heart disease of native coronary artery without angina pectoris: Secondary | ICD-10-CM | POA: Diagnosis not present

## 2022-07-20 DIAGNOSIS — I10 Essential (primary) hypertension: Secondary | ICD-10-CM

## 2022-07-20 DIAGNOSIS — H6191 Disorder of right external ear, unspecified: Secondary | ICD-10-CM | POA: Diagnosis not present

## 2022-07-20 DIAGNOSIS — N1831 Chronic kidney disease, stage 3a: Secondary | ICD-10-CM

## 2022-07-20 LAB — CBC WITH DIFFERENTIAL/PLATELET
Basophils Absolute: 0.1 10*3/uL (ref 0.0–0.1)
Basophils Relative: 1.1 % (ref 0.0–3.0)
Eosinophils Absolute: 0 10*3/uL (ref 0.0–0.7)
Eosinophils Relative: 0.2 % (ref 0.0–5.0)
HCT: 29.4 % — ABNORMAL LOW (ref 36.0–46.0)
Hemoglobin: 9.3 g/dL — ABNORMAL LOW (ref 12.0–15.0)
Lymphocytes Relative: 12.9 % (ref 12.0–46.0)
Lymphs Abs: 0.8 10*3/uL (ref 0.7–4.0)
MCHC: 31.7 g/dL (ref 30.0–36.0)
MCV: 80.4 fl (ref 78.0–100.0)
Monocytes Absolute: 0.5 10*3/uL (ref 0.1–1.0)
Monocytes Relative: 7.2 % (ref 3.0–12.0)
Neutro Abs: 5.1 10*3/uL (ref 1.4–7.7)
Neutrophils Relative %: 78.6 % — ABNORMAL HIGH (ref 43.0–77.0)
Platelets: 283 10*3/uL (ref 150.0–400.0)
RBC: 3.65 Mil/uL — ABNORMAL LOW (ref 3.87–5.11)
RDW: 18.3 % — ABNORMAL HIGH (ref 11.5–15.5)
WBC: 6.4 10*3/uL (ref 4.0–10.5)

## 2022-07-20 LAB — BASIC METABOLIC PANEL
BUN: 15 mg/dL (ref 6–23)
CO2: 34 mEq/L — ABNORMAL HIGH (ref 19–32)
Calcium: 9.4 mg/dL (ref 8.4–10.5)
Chloride: 91 mEq/L — ABNORMAL LOW (ref 96–112)
Creatinine, Ser: 0.76 mg/dL (ref 0.40–1.20)
GFR: 76.04 mL/min (ref >60.00)
Glucose, Bld: 86 mg/dL (ref 70–99)
Potassium: 4.4 mEq/L (ref 3.5–5.1)
Sodium: 130 mEq/L — ABNORMAL LOW (ref 135–145)

## 2022-07-20 LAB — IBC + FERRITIN
Ferritin: 9.4 ng/mL — ABNORMAL LOW (ref 10.0–291.0)
Iron: 21 ug/dL — ABNORMAL LOW (ref 42–145)
Saturation Ratios: 4.5 % — ABNORMAL LOW (ref 20.0–50.0)
TIBC: 470.4 ug/dL — ABNORMAL HIGH (ref 250.0–450.0)
Transferrin: 336 mg/dL (ref 212.0–360.0)

## 2022-07-20 MED ORDER — ACCRUFER 30 MG PO CAPS: 1.0000 | ORAL_CAPSULE | Freq: Two times a day (BID) | ORAL | 0 refills | Status: DC

## 2022-07-20 NOTE — Patient Instructions (Signed)

## 2022-07-20 NOTE — Progress Notes (Signed)
Subjective:  Patient ID: [name], female    DOB: 1946-03-08  Age: 76 y.o. MRN: [id number]  CC: Anemia   HPI [name] presents for f/up -  She is concerns about a skin lesion on her right ear.  Outpatient Medications Prior to Visit  Medication Sig Dispense Refill   albuterol (VENTOLIN HFA) 108 (90 Base) MCG/ACT inhaler Inhale 2 puffs into the lungs every 4 (four) hours as needed for wheezing or shortness of breath. 36 g 5   ALPRAZolam (XANAX) 1 MG tablet TAKE 1 TABLET BY MOUTH 3 TIMES DAILY AS NEEDED FOR ANXIETY. (Patient taking differently: Take 1 mg by mouth 3 (three) times daily as needed for anxiety.) 90 tablet 3   aspirin EC 325 MG tablet Take 325 mg by mouth in the morning.     atorvastatin (LIPITOR) 10 MG tablet TAKE 1 TABLET BY MOUTH EVERY DAY 90 tablet 1   butalbital-acetaminophen-caffeine (FIORICET) 50-325-40 MG tablet Take 1 tablet by mouth every 4 (four) hours as needed for headache. 65 tablet 3   Cholecalciferol (VITAMIN D-3) 25 MCG (1000 UT) CAPS Take 1,000 Units by mouth daily.     cyanocobalamin (VITAMIN B12) 1000 MCG tablet TAKE 1 TABLET BY MOUTH EVERY DAY 90 tablet 1   diclofenac Sodium (VOLTAREN) 1 % GEL APPLY 2 GRAMS TO AFFECTED AREA 4 TIMES A DAY 300 g 1   furosemide (LASIX) 40 MG tablet Take 1 tablet (40 mg total) by mouth 2 (two) times daily. 180 tablet 0   ipratropium (ATROVENT HFA) 17 MCG/ACT inhaler Inhale 2 puffs into the lungs every 4 (four) hours as needed for wheezing. 38.7 g 1   ipratropium-albuterol (DUONEB) 0.5-2.5 (3) MG/3ML SOLN Inhale 3 mLs into the lungs every 4 (four) hours as needed. Nebulize 3 ml's and inhale into the lungs one to two times a day 360 mL 1   KLOR-CON M20 20 MEQ tablet TAKE 1 TABLET BY MOUTH EVERY DAY 90 tablet 1   lactulose, encephalopathy, (ENULOSE) 10 GM/15ML SOLN Take 45 mLs (30 g total) by mouth 3 (three) times daily as needed (constipation). (Patient taking differently: Take 30 g by mouth at bedtime as needed (for  constipation).) 1892 mL 0   levocetirizine (XYZAL) 5 MG tablet TAKE 1 TABLET BY MOUTH EVERY DAY IN THE EVENING (Patient taking differently: Take 5 mg by mouth at bedtime.) 90 tablet 1   LINZESS 72 MCG capsule TAKE 1 CAPSULE BY MOUTH EVERY DAY BEFORE BREAKFAST 90 capsule 1   LIVALO 2 MG TABS TAKE 1 TABLET BY MOUTH EVERY DAY (Patient taking differently: Take 2 mg by mouth daily at 12 noon.) 90 tablet 3   meclizine (ANTIVERT) 25 MG tablet Take 12.5-25 mg by mouth 3 (three) times daily as needed for dizziness.     mometasone-formoterol (DULERA) 200-5 MCG/ACT AERO Inhale 2 puffs into the lungs 2 (two) times daily. 1 each 6   montelukast (SINGULAIR) 10 MG tablet TAKE 1 TABLET BY MOUTH EVERYDAY AT BEDTIME (Patient taking differently: Take 10 mg by mouth at bedtime.) 90 tablet 1   nortriptyline (PAMELOR) 25 MG capsule TAKE 2 CAPSULES BY MOUTH AT BEDTIME 180 capsule 0   Omega 3 1200 MG CAPS Take 1,200 mg by mouth every morning.      OXYGEN Inhale 3 L/min into the lungs See admin instructions. 3 L/min at bedtime and during the day as needed for shortness of breath     pantoprazole (PROTONIX) 40 MG tablet TAKE 1 TABLET  BY MOUTH EVERY DAY (Patient taking differently: Take 40 mg by mouth daily before breakfast.) 90 tablet 1   polyvinyl alcohol (LIQUIFILM TEARS) 1.4 % ophthalmic solution Place 1 drop into both eyes 3 (three) times daily as needed for dry eyes.     senna-docusate (SENOKOT-S) 8.6-50 MG tablet Take 1 tablet by mouth 2 (two) times daily between meals as needed for moderate constipation.  0   vitamin C (ASCORBIC ACID) 500 MG tablet Take 500 mg by mouth daily.     No facility-administered medications prior to visit.    ROS Review of Systems  Constitutional:  Negative for chills, diaphoresis, fatigue and fever.  HENT: Negative.    Eyes: Negative.   Respiratory:  Positive for shortness of breath. Negative for cough, chest tightness and stridor.   Cardiovascular:  Negative for chest pain,  palpitations and leg swelling.  Gastrointestinal:  Negative for abdominal pain, diarrhea and nausea.  Endocrine: Negative.   Genitourinary: Negative.  Negative for difficulty urinating.  Musculoskeletal: Negative.   Skin: Negative.  Negative for color change.  Neurological:  Negative for dizziness, weakness and light-headedness.  Hematological:  Negative for adenopathy. Does not bruise/bleed easily.  Psychiatric/Behavioral: Negative.      Objective:  BP 116/62 (BP Location: Left Arm, Patient Position: Sitting, Cuff Size: Large)   Pulse 62   Temp 97.6 F (36.4 C) (Oral)   Ht 5\' 2"  (1.575 m)   Wt 107 lb (48.5 kg)   SpO2 99%   BMI 19.57 kg/m   BP Readings from Last 3 Encounters:  07/20/22 116/62  04/28/22 (!) 148/65  04/22/22 118/60    Wt Readings from Last 3 Encounters:  07/20/22 107 lb (48.5 kg)  04/28/22 117 lb 1 oz (53.1 kg)  04/22/22 106 lb 9.6 oz (48.4 kg)    Physical Exam Constitutional:      General: She is not in acute distress.    Appearance: She is ill-appearing. She is not toxic-appearing or diaphoretic.  HENT:     Nose: Nose normal.     Mouth/Throat:     Mouth: Mucous membranes are moist.  Eyes:     General: No scleral icterus.    Conjunctiva/sclera: Conjunctivae normal.  Cardiovascular:     Rate and Rhythm: Normal rate.     Heart sounds: Murmur heard.     Systolic murmur is present with a grade of 3/6.     No diastolic murmur is present.     No friction rub. No gallop.  Pulmonary:     Effort: Tachypnea and accessory muscle usage present. No respiratory distress.     Breath sounds: No stridor or decreased air movement. Examination of the right-upper field reveals decreased breath sounds. Examination of the left-upper field reveals decreased breath sounds. Examination of the right-middle field reveals decreased breath sounds and rhonchi. Examination of the left-middle field reveals decreased breath sounds and rhonchi. Examination of the right-lower field  reveals decreased breath sounds and rhonchi. Examination of the left-lower field reveals decreased breath sounds and rhonchi. Decreased breath sounds and rhonchi present. No wheezing or rales.  Abdominal:     Palpations: There is no mass.     Tenderness: There is no abdominal tenderness. There is no guarding.     Hernia: No hernia is present.  Musculoskeletal:     Cervical back: Neck supple.     Right lower leg: No edema.     Left lower leg: No edema.  Skin:    General: Skin is warm  and dry.     Findings: Lesion present.  Neurological:     General: No focal deficit present.     Mental Status: She is alert.     Lab Results  Component Value Date   WBC 6.4 07/20/2022   HGB 9.3 (L) 07/20/2022   HCT 29.4 (L) 07/20/2022   PLT 283.0 07/20/2022   GLUCOSE 86 07/20/2022   CHOL 214 (H) 07/06/2022   TRIG 102 07/06/2022   HDL 98 07/06/2022   LDLCALC 99 07/06/2022   ALT 14 04/26/2022   AST 23 04/26/2022   NA 130 (L) 07/20/2022   K 4.4 07/20/2022   CL 91 (L) 07/20/2022   CREATININE 0.76 07/20/2022   BUN 15 07/20/2022   CO2 34 (H) 07/20/2022   TSH 3.31 03/03/2022   INR 1.0 12/23/2020   HGBA1C 5.1 01/15/2021    CT Chest Wo Contrast  Result Date: 04/27/2022 CLINICAL DATA:  Breast cancer. Abnormal lung bases on CT abdomen study performed earlier today. * Tracking Code: BO * EXAM: CT CHEST WITHOUT CONTRAST TECHNIQUE: Multidetector CT imaging of the chest was performed following the standard protocol without IV contrast. RADIATION DOSE REDUCTION: This exam was performed according to the departmental dose-optimization program which includes automated exposure control, adjustment of the mA and/or kV according to patient size and/or use of iterative reconstruction technique. COMPARISON:  04/27/2022 CT abdomen/pelvis. 04/09/2022 chest radiograph. 01/01/2015 chest CT. FINDINGS: Cardiovascular: Normal heart size. No significant pericardial effusion/thickening. Three-vessel coronary atherosclerosis.  Atherosclerotic nonaneurysmal thoracic aorta. Top-normal caliber main pulmonary artery (3.3 cm diameter). Mediastinum/Nodes: No discrete thyroid nodules. Unremarkable esophagus. Surgical clips again noted in the right axilla. No axillary adenopathy. Mildly enlarged 1.2 cm short axis diameter lower right paratracheal node (series 3/image 51), stable since 01/01/2015 CT. No additional pathologically enlarged mediastinal nodes. No discrete hilar adenopathy on these noncontrast images. Lungs/Pleura: No pneumothorax. No pleural effusion. Sharply marginated consolidation in the anterior mid to upper right lung with associated volume loss, mild bronchiectasis and distortion, unchanged and compatible with radiation fibrosis. No acute consolidative airspace disease or lung masses. Scattered regions of mild cylindrical bronchiolectasis throughout both lungs with associated mild patchy tree-in-bud opacity, most prominent in the left upper and left lower lobes, increased from 01/01/2015 chest CT. A few new indistinct solid pulmonary nodules in both lungs, largest 0.7 cm in the left lower lobe (series 2/image 82). Peripheral left lower lobe indistinct 0.5 cm nodule (series 2/image 107) is stable since 01/01/2015 chest CT. Upper abdomen: Cholecystectomy. Musculoskeletal: No aggressive appearing focal osseous lesions. Moderate thoracic spondylosis. IMPRESSION: 1. Scattered regions of mild cylindrical bronchiolectasis with associated mild patchy tree-in-bud opacity, most prominent in the left upper and left lower lobes, increased from 01/01/2015 chest CT. 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 2. A few new indistinct solid pulmonary nodules in both lungs, largest 0.7 cm in the left lower lobe, nonspecific and potentially due to above suspected chronic infectious/inflammatory process. Recommend attention on follow-up noncontrast chest CT  in 3 months given the history of breast cancer. 3. Chronic mild mediastinal lymphadenopathy, unchanged since 2016, presumably reactive. 4. 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 5. Three-vessel coronary atherosclerosis. 6. Aortic Atherosclerosis (ICD10-I70.0). Electronically Signed   By: Delbert Phenix M.D.   On: 04/27/2022 08:26   CT ABDOMEN PELVIS W CONTRAST  Result Date: 04/27/2022 CLINICAL DATA:  76 year old female with history of blunt trauma. Constipation for 6 days after taking antibiotics. EXAM: CT  ABDOMEN AND PELVIS WITH CONTRAST TECHNIQUE: Multidetector CT imaging of the abdomen and pelvis was performed using the standard protocol following bolus administration of intravenous contrast. RADIATION DOSE REDUCTION: This exam was performed according to the departmental dose-optimization program which includes automated exposure control, adjustment of the mA and/or kV according to patient size and/or use of iterative reconstruction technique. CONTRAST:  80mL OMNIPAQUE IOHEXOL 300 MG/ML  SOLN COMPARISON:  CT of the abdomen and pelvis 04/22/2020. FINDINGS: Lower chest: Scattered areas of bronchial wall thickening, thickening of the peribronchovascular interstitium, peribronchovascular micro nodularity and regional architectural distortion noted in the lung bases, similar to but slightly increased compared to the prior study, likely reflective of a chronic indolent atypical infectious process with areas of mucoid impaction within terminal bronchioles. The largest of these small pulmonary nodules measures 6 x 4 mm (mean diameter of 5 mm) in the periphery of the left lower lobe (axial image 34 of series 3), similar to the prior study. Atherosclerotic calcifications are noted in the descending thoracic aorta as well as the right coronary artery. Severe calcifications of the aortic valve. Hepatobiliary: No suspicious cystic or solid hepatic lesions. No intra or extrahepatic biliary ductal dilatation.  Status post cholecystectomy. Pancreas: No pancreatic mass. No pancreatic ductal dilatation. No pancreatic or peripancreatic fluid collections or inflammatory changes. Spleen: Unremarkable. Adrenals/Urinary Tract: 1.8 cm exophytic low-attenuation lesion in the anterior aspect of the interpolar region of the right kidney is compatible with a small simple cyst, as is an exophytic 1.2 cm lesion in the lateral aspect of the interpolar region of the left kidney (no imaging follow-up is recommended). No suspicious renal lesions are noted. Cortical scarring in the posterior aspect of the left kidney incidentally noted. No hydroureteronephrosis. Urinary bladder is normal in appearance. Bilateral adrenal glands are normal in appearance. Stomach/Bowel: The appearance of the stomach is unremarkable. There is no pathologic dilatation of small bowel or colon. Large volume of stool noted throughout the colon and rectum. The appendix is not confidently identified and may be surgically absent. Regardless, there are no inflammatory changes noted adjacent to the cecum to suggest the presence of an acute appendicitis at this time. Infraumbilical ventral hernia containing several loops of mid to distal small bowel, similar to the prior examination. Vascular/Lymphatic: Aortic atherosclerosis with mild fusiform ectasia of the infrarenal abdominal aorta which measures up to 2.5 x 2.4 cm. No aneurysm or dissection noted in the abdominal or pelvic vasculature. No lymphadenopathy noted in the abdomen or pelvis. Reproductive: Uterus and ovaries are unremarkable in appearance. Other: No significant volume of ascites.  No pneumoperitoneum. Musculoskeletal: There are no aggressive appearing lytic or blastic lesions noted in the visualized portions of the skeleton. IMPRESSION: 1. No acute findings are noted in the abdomen or pelvis. 2. Moderate-sized infraumbilical ventral hernia containing several loops of mid to distal small bowel, without  associated bowel incarceration or obstruction at this time. 3. Large volume of stool throughout the colon and rectum, compatible with reported clinical history of constipation. 4. 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 Multiple small nodules in this region likely reflect areas of chronic mucoid impaction within terminal bronchioles. Outpatient referral to Pulmonology for further clinical evaluation is recommended. Follow-up nonemergent outpatient high-resolution chest CT should also be considered to better evaluate these findings. 5. Aortic atherosclerosis, in addition to at least right coronary artery disease. Assessment for potential risk factor modification, dietary therapy or pharmacologic therapy may  be warranted, if clinically indicated. 6. There are severe calcifications of the aortic valve. Echocardiographic correlation for evaluation of potential valvular dysfunction may be warranted if clinically indicated. Electronically Signed   By: Trudie Reed M.D.   On: 04/27/2022 05:53    Assessment & Plan:   Nota was seen today for anemia.  Diagnoses and all orders for this visit:  Flu vaccine need -     Flu Vaccine QUAD High Dose(Fluad)  Lesion of skin of right ear- This looks like BCC. -     Ambulatory referral to Dermatology  Primary hypertension- Her BP is well controlled. -     Basic metabolic panel; Future -     Basic metabolic panel  Stage 3a chronic kidney disease (HCC)- Her renal function is stable. -     Basic metabolic panel; Future -     Basic metabolic panel  Vitamin B12 deficiency anemia due to intrinsic factor deficiency -     CBC with Differential/Platelet; Future -     CBC with Differential/Platelet  Iron deficiency- Will start an iron supplement. -     IBC + Ferritin; Future -     CBC with Differential/Platelet; Future -     CBC with  Differential/Platelet -     IBC + Ferritin   I am having [name] "[name]" start on ACCRUFeR. I am also having her maintain her Omega 3, ascorbic acid, polyvinyl alcohol, Enulose, levocetirizine, Livalo, montelukast, ALPRAZolam, mometasone-formoterol, pantoprazole, OXYGEN, aspirin EC, Vitamin D-3, meclizine, senna-docusate, albuterol, [place]-Con M20, atorvastatin, Linzess, furosemide, Atrovent HFA, nortriptyline, butalbital-acetaminophen-caffeine, cyanocobalamin, diclofenac Sodium, and ipratropium-albuterol.  Meds ordered this encounter  Medications   Ferric Maltol (ACCRUFER) 30 MG CAPS    Sig: Take 1 capsule by mouth in the morning and at bedtime.    Dispense:  180 capsule    Refill:  0     Follow-up: Return in about 6 months (around 01/19/2023).  Sanda Linger, MD

## 2022-07-22 ENCOUNTER — Other Ambulatory Visit: Payer: Self-pay | Admitting: Internal Medicine

## 2022-07-22 DIAGNOSIS — J301 Allergic rhinitis due to pollen: Secondary | ICD-10-CM

## 2022-07-29 ENCOUNTER — Other Ambulatory Visit: Payer: Self-pay | Admitting: Internal Medicine

## 2022-07-29 DIAGNOSIS — E611 Iron deficiency: Secondary | ICD-10-CM

## 2022-08-02 ENCOUNTER — Ambulatory Visit (INDEPENDENT_AMBULATORY_CARE_PROVIDER_SITE_OTHER): Payer: Medicare Other | Admitting: Otolaryngology

## 2022-08-07 ENCOUNTER — Emergency Department (HOSPITAL_COMMUNITY): Payer: Medicare Other

## 2022-08-07 ENCOUNTER — Inpatient Hospital Stay (HOSPITAL_COMMUNITY)
Admission: EM | Admit: 2022-08-07 | Discharge: 2022-08-12 | DRG: 189 | Disposition: A | Discharge status: Home / Self Care | Payer: Medicare Other | Attending: Internal Medicine | Admitting: Internal Medicine

## 2022-08-07 ENCOUNTER — Telehealth: Payer: Self-pay | Admitting: Physician Assistant

## 2022-08-07 ENCOUNTER — Encounter (HOSPITAL_COMMUNITY): Payer: Self-pay | Admitting: Emergency Medicine

## 2022-08-07 DIAGNOSIS — K5904 Chronic idiopathic constipation: Secondary | ICD-10-CM | POA: Diagnosis present

## 2022-08-07 DIAGNOSIS — N183 Chronic kidney disease, stage 3 unspecified: Secondary | ICD-10-CM | POA: Diagnosis present

## 2022-08-07 DIAGNOSIS — E876 Hypokalemia: Secondary | ICD-10-CM | POA: Diagnosis not present

## 2022-08-07 DIAGNOSIS — F419 Anxiety disorder, unspecified: Secondary | ICD-10-CM | POA: Diagnosis present

## 2022-08-07 DIAGNOSIS — R627 Adult failure to thrive: Secondary | ICD-10-CM | POA: Diagnosis present

## 2022-08-07 DIAGNOSIS — E871 Hypo-osmolality and hyponatremia: Secondary | ICD-10-CM | POA: Diagnosis present

## 2022-08-07 DIAGNOSIS — Z955 Presence of coronary angioplasty implant and graft: Secondary | ICD-10-CM

## 2022-08-07 DIAGNOSIS — J479 Bronchiectasis, uncomplicated: Secondary | ICD-10-CM | POA: Diagnosis present

## 2022-08-07 DIAGNOSIS — Z9981 Dependence on supplemental oxygen: Secondary | ICD-10-CM

## 2022-08-07 DIAGNOSIS — Z853 Personal history of malignant neoplasm of breast: Secondary | ICD-10-CM

## 2022-08-07 DIAGNOSIS — E611 Iron deficiency: Secondary | ICD-10-CM | POA: Diagnosis present

## 2022-08-07 DIAGNOSIS — Z803 Family history of malignant neoplasm of breast: Secondary | ICD-10-CM

## 2022-08-07 DIAGNOSIS — Z8701 Personal history of pneumonia (recurrent): Secondary | ICD-10-CM

## 2022-08-07 DIAGNOSIS — Z681 Body mass index (BMI) 19 or less, adult: Secondary | ICD-10-CM

## 2022-08-07 DIAGNOSIS — I252 Old myocardial infarction: Secondary | ICD-10-CM

## 2022-08-07 DIAGNOSIS — F411 Generalized anxiety disorder: Secondary | ICD-10-CM | POA: Diagnosis present

## 2022-08-07 DIAGNOSIS — R296 Repeated falls: Secondary | ICD-10-CM | POA: Diagnosis present

## 2022-08-07 DIAGNOSIS — E875 Hyperkalemia: Secondary | ICD-10-CM | POA: Diagnosis not present

## 2022-08-07 DIAGNOSIS — I10 Essential (primary) hypertension: Secondary | ICD-10-CM | POA: Diagnosis present

## 2022-08-07 DIAGNOSIS — I130 Hypertensive heart and chronic kidney disease with heart failure and stage 1 through stage 4 chronic kidney disease, or unspecified chronic kidney disease: Secondary | ICD-10-CM | POA: Diagnosis present

## 2022-08-07 DIAGNOSIS — E440 Moderate protein-calorie malnutrition: Secondary | ICD-10-CM | POA: Insufficient documentation

## 2022-08-07 DIAGNOSIS — F17210 Nicotine dependence, cigarettes, uncomplicated: Secondary | ICD-10-CM | POA: Diagnosis present

## 2022-08-07 DIAGNOSIS — J9621 Acute and chronic respiratory failure with hypoxia: Secondary | ICD-10-CM | POA: Diagnosis present

## 2022-08-07 DIAGNOSIS — Z818 Family history of other mental and behavioral disorders: Secondary | ICD-10-CM

## 2022-08-07 DIAGNOSIS — Z79899 Other long term (current) drug therapy: Secondary | ICD-10-CM

## 2022-08-07 DIAGNOSIS — Z881 Allergy status to other antibiotic agents status: Secondary | ICD-10-CM

## 2022-08-07 DIAGNOSIS — I350 Nonrheumatic aortic (valve) stenosis: Secondary | ICD-10-CM | POA: Diagnosis present

## 2022-08-07 DIAGNOSIS — J439 Emphysema, unspecified: Secondary | ICD-10-CM | POA: Diagnosis present

## 2022-08-07 DIAGNOSIS — Z7951 Long term (current) use of inhaled steroids: Secondary | ICD-10-CM

## 2022-08-07 DIAGNOSIS — E785 Hyperlipidemia, unspecified: Secondary | ICD-10-CM | POA: Diagnosis present

## 2022-08-07 DIAGNOSIS — N1831 Chronic kidney disease, stage 3a: Secondary | ICD-10-CM | POA: Diagnosis present

## 2022-08-07 DIAGNOSIS — Z87892 Personal history of anaphylaxis: Secondary | ICD-10-CM

## 2022-08-07 DIAGNOSIS — J9622 Acute and chronic respiratory failure with hypercapnia: Principal | ICD-10-CM | POA: Diagnosis present

## 2022-08-07 DIAGNOSIS — G9341 Metabolic encephalopathy: Secondary | ICD-10-CM | POA: Diagnosis present

## 2022-08-07 DIAGNOSIS — Z9010 Acquired absence of unspecified breast and nipple: Secondary | ICD-10-CM

## 2022-08-07 DIAGNOSIS — Z9049 Acquired absence of other specified parts of digestive tract: Secondary | ICD-10-CM

## 2022-08-07 DIAGNOSIS — Z7982 Long term (current) use of aspirin: Secondary | ICD-10-CM

## 2022-08-07 DIAGNOSIS — J441 Chronic obstructive pulmonary disease with (acute) exacerbation: Secondary | ICD-10-CM | POA: Diagnosis present

## 2022-08-07 DIAGNOSIS — I25810 Atherosclerosis of coronary artery bypass graft(s) without angina pectoris: Secondary | ICD-10-CM | POA: Diagnosis present

## 2022-08-07 DIAGNOSIS — Z83438 Family history of other disorder of lipoprotein metabolism and other lipidemia: Secondary | ICD-10-CM

## 2022-08-07 DIAGNOSIS — Z1152 Encounter for screening for COVID-19: Secondary | ICD-10-CM

## 2022-08-07 DIAGNOSIS — Z8249 Family history of ischemic heart disease and other diseases of the circulatory system: Secondary | ICD-10-CM

## 2022-08-07 DIAGNOSIS — I5032 Chronic diastolic (congestive) heart failure: Secondary | ICD-10-CM | POA: Diagnosis present

## 2022-08-07 DIAGNOSIS — I272 Pulmonary hypertension, unspecified: Secondary | ICD-10-CM | POA: Diagnosis present

## 2022-08-07 DIAGNOSIS — Z888 Allergy status to other drugs, medicaments and biological substances status: Secondary | ICD-10-CM

## 2022-08-07 DIAGNOSIS — K219 Gastro-esophageal reflux disease without esophagitis: Secondary | ICD-10-CM | POA: Diagnosis present

## 2022-08-07 LAB — COMPREHENSIVE METABOLIC PANEL
ALT: 12 U/L (ref 0–44)
AST: 17 U/L (ref 15–41)
Albumin: 3.1 g/dL — ABNORMAL LOW (ref 3.5–5.0)
Alkaline Phosphatase: 45 U/L (ref 38–126)
Anion gap: 9 (ref 5–15)
BUN: 10 mg/dL (ref 8–23)
CO2: 34 mmol/L — ABNORMAL HIGH (ref 22–32)
Calcium: 9.2 mg/dL (ref 8.9–10.3)
Chloride: 94 mmol/L — ABNORMAL LOW (ref 98–111)
Creatinine, Ser: 0.71 mg/dL (ref 0.44–1.00)
GFR, Estimated: >60 mL/min (ref >60)
Glucose, Bld: 83 mg/dL (ref 70–99)
Potassium: 4.8 mmol/L (ref 3.5–5.1)
Sodium: 137 mmol/L (ref 135–145)
Total Bilirubin: 0.1 mg/dL — ABNORMAL LOW (ref 0.3–1.2)
Total Protein: 6.3 g/dL — ABNORMAL LOW (ref 6.5–8.1)

## 2022-08-07 LAB — CBC WITH DIFFERENTIAL/PLATELET
Abs Immature Granulocytes: 0.01 10*3/uL (ref 0.00–0.07)
Basophils Absolute: 0.1 10*3/uL (ref 0.0–0.1)
Basophils Relative: 1 %
Eosinophils Absolute: 0.1 10*3/uL (ref 0.0–0.5)
Eosinophils Relative: 1 %
HCT: 33.7 % — ABNORMAL LOW (ref 36.0–46.0)
Hemoglobin: 9.4 g/dL — ABNORMAL LOW (ref 12.0–15.0)
Immature Granulocytes: 0 %
Lymphocytes Relative: 20 %
Lymphs Abs: 1.1 10*3/uL (ref 0.7–4.0)
MCH: 25.1 pg — ABNORMAL LOW (ref 26.0–34.0)
MCHC: 27.9 g/dL — ABNORMAL LOW (ref 30.0–36.0)
MCV: 89.9 fL (ref 80.0–100.0)
Monocytes Absolute: 0.5 10*3/uL (ref 0.1–1.0)
Monocytes Relative: 8 %
Neutro Abs: 4 10*3/uL (ref 1.7–7.7)
Neutrophils Relative %: 70 %
Platelets: 289 10*3/uL (ref 150–400)
RBC: 3.75 MIL/uL — ABNORMAL LOW (ref 3.87–5.11)
RDW: 18.8 % — ABNORMAL HIGH (ref 11.5–15.5)
WBC: 5.7 10*3/uL (ref 4.0–10.5)
nRBC: 0 % (ref 0.0–0.2)

## 2022-08-07 LAB — TROPONIN I (HIGH SENSITIVITY)
Troponin I (High Sensitivity): 11 ng/L (ref <18)
Troponin I (High Sensitivity): 11 ng/L (ref <18)

## 2022-08-07 LAB — CBG MONITORING, ED: Glucose-Capillary: 81 mg/dL (ref 70–99)

## 2022-08-07 LAB — TYPE AND SCREEN
ABO/RH(D): A NEG
Antibody Screen: NEGATIVE

## 2022-08-07 LAB — BRAIN NATRIURETIC PEPTIDE: B Natriuretic Peptide: 112.5 pg/mL — ABNORMAL HIGH (ref 0.0–100.0)

## 2022-08-07 NOTE — ED Triage Notes (Addendum)
Patient arrives via [facility] from home. Lethargic per family and hard to arouse. Patient presenting very pale. Hx of regular blood transfusions. Per patient she has been falling a lot at home.   Hx of COPD, CHF, lobe removed in the left, 2 L O2 [place] chronically   VS stable EtCO2:35-45 EMS unable to obtain SpO2 enroute.

## 2022-08-07 NOTE — ED Provider Notes (Signed)
[name] EMERGENCY DEPARTMENT Provider Note   CSN: [id number] Arrival date & time: 08/07/22  1448     History {Add pertinent medical, surgical, social history, OB history to HPI:1} Chief Complaint  Patient presents with   Fatigue    [name] is a 76 y.o. female.  76 year old female who presents the ER today with generalized weakness and abdominal pain.  Her daughter is with her and helps supply the history.  Review of the charts also paints a picture.  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  She had IVIG, PEG tube was intubated and extubated and was suggested to go to skilled nursing however it sounds like there was some issue with her they wanted to send her so daughter elected to take her home with physical therapy and Occupational Therapy and nursing but never got a nurse to come help.  She is getting her tube feeds for 22 hours a day.  She supposed take her Synthroid during other 2 hours however she has been out of her Synthroid since she left the hospital.  She also has not been on her Mestinon since she left the hospital.  She is developed some pain around her PEG tube.  It sounds like some of it is superficial where she has some maceration of the skin but she also has some deeper pain in the daughter states that she has had low blood pressures for the last couple days as low as 80s over 50s where she normally runs around 536 systolic.  No fevers.  No vomiting or urinary changes.  She does take thickened feeds.        Home Medications Prior to Admission medications   Medication Sig Start Date End Date Taking? Authorizing Provider  ACCRUFER 30 MG CAPS TAKE 1 CAPSULE BY MOUTH EVERY DAY IN THE MORNING AND AT BEDTIME 07/29/22   [name], MD  albuterol (VENTOLIN HFA) 108 (90 Base) MCG/ACT inhaler Inhale 2 puffs into the lungs every 4 (four) hours as needed for wheezing or shortness of breath. 04/29/22   [name],  MD  ALPRAZolam Duanne Moron) 1 MG tablet TAKE 1 TABLET BY MOUTH 3 TIMES DAILY AS NEEDED FOR ANXIETY. Patient taking differently: Take 1 mg by mouth 3 (three) times daily as needed for anxiety. 03/11/22   [name], MD  aspirin EC 325 MG tablet Take 325 mg by mouth in the morning.    [provider]  atorvastatin (LIPITOR) 10 MG tablet TAKE 1 TABLET BY MOUTH EVERY DAY 05/08/22   [name], MD  butalbital-acetaminophen-caffeine (FIORICET) 778-100-0050 MG tablet Take 1 tablet by mouth every 4 (four) hours as needed for headache. 06/12/22   [name], MD  Cholecalciferol (VITAMIN D-3) 25 MCG (1000 UT) CAPS Take 1,000 Units by mouth daily.    [provider]  cyanocobalamin (VITAMIN B12) 1000 MCG tablet TAKE 1 TABLET BY MOUTH EVERY DAY 06/27/22   [name], MD  diclofenac Sodium (VOLTAREN) 1 % GEL APPLY 2 GRAMS TO AFFECTED AREA 4 TIMES A DAY 07/07/22   [name], MD  furosemide (LASIX) 40 MG tablet Take 1 tablet (40 mg total) by mouth 2 (two) times daily. 05/12/22   [name], MD  ipratropium (ATROVENT HFA) 17 MCG/ACT inhaler Inhale 2 puffs into the lungs every 4 (four) hours as needed for wheezing. 06/01/22   [name], MD  ipratropium-albuterol (DUONEB) 0.5-2.5 (3) MG/3ML  SOLN Inhale 3 mLs into the lungs every 4 (four) hours as needed. Nebulize 3 ml's and inhale into the lungs one to two times a day 07/07/22   [name], MD  KLOR-CON M20 20 MEQ tablet TAKE 1 TABLET BY MOUTH EVERY DAY 05/03/22   [name], MD  lactulose, encephalopathy, (ENULOSE) 10 GM/15ML SOLN Take 45 mLs (30 g total) by mouth 3 (three) times daily as needed (constipation). Patient taking differently: Take 30 g by mouth at bedtime as needed (for constipation). 01/26/22   [name], MD  levocetirizine (XYZAL) 5 MG tablet TAKE 1 TABLET BY MOUTH EVERY DAY IN THE EVENING 07/22/22   [name], MD  LINZESS 72 MCG capsule TAKE 1 CAPSULE BY MOUTH EVERY DAY BEFORE BREAKFAST 05/08/22    [name], MD  LIVALO 2 MG TABS TAKE 1 TABLET BY MOUTH EVERY DAY Patient taking differently: Take 2 mg by mouth daily at 12 noon. 02/17/22   Hilty, Nadean Corwin, MD  meclizine (ANTIVERT) 25 MG tablet Take 12.5-25 mg by mouth 3 (three) times daily as needed for dizziness.    [provider]  mometasone-formoterol (DULERA) 200-5 MCG/ACT AERO Inhale 2 puffs into the lungs 2 (two) times daily. 04/05/22   Hunsucker, Bonna Gains, MD  montelukast (SINGULAIR) 10 MG tablet TAKE 1 TABLET BY MOUTH EVERYDAY AT BEDTIME Patient taking differently: Take 10 mg by mouth at bedtime. 02/25/22   [name], MD  nortriptyline (PAMELOR) 25 MG capsule TAKE 2 CAPSULES BY MOUTH AT BEDTIME 06/01/22   [name], MD  Omega 3 1200 MG CAPS Take 1,200 mg by mouth every morning.     [provider]  OXYGEN Inhale 3 L/min into the lungs See admin instructions. 3 L/min at bedtime and during the day as needed for shortness of breath    [provider]  pantoprazole (PROTONIX) 40 MG tablet TAKE 1 TABLET BY MOUTH EVERY DAY Patient taking differently: Take 40 mg by mouth daily before breakfast. 04/17/22   [name], MD  polyvinyl alcohol (LIQUIFILM TEARS) 1.4 % ophthalmic solution Place 1 drop into both eyes 3 (three) times daily as needed for dry eyes.    [provider]  senna-docusate (SENOKOT-S) 8.6-50 MG tablet Take 1 tablet by mouth 2 (two) times daily between meals as needed for moderate constipation. 04/28/22   Mercy Riding, MD  vitamin C (ASCORBIC ACID) 500 MG tablet Take 500 mg by mouth daily.    [provider]      Allergies    Ceftriaxone, Hydroxyzine, Other, Doxycycline, and Lexapro [escitalopram]    Review of Systems   Review of Systems  Physical Exam Updated Vital Signs BP 138/63   Pulse 65   Temp 98 F (36.7 C)   Resp 14   SpO2 94%  Physical Exam Vitals and nursing note reviewed.  Constitutional:      Appearance: She is well-developed. She is  ill-appearing.  HENT:     Head: Normocephalic and atraumatic.     Mouth/Throat:     Mouth: Mucous membranes are moist.  Eyes:     Pupils: Pupils are equal, round, and reactive to light.  Cardiovascular:     Rate and Rhythm: Normal rate and regular rhythm.  Pulmonary:     Effort: No respiratory distress.     Breath sounds: No stridor.  Abdominal:     General: Abdomen is flat. There is no distension.  Musculoskeletal:  General: No swelling or tenderness. Normal range of motion.     Cervical back: Normal range of motion.  Skin:    General: Skin is warm and dry.  Neurological:     Mental Status: She is alert and oriented to person, place, and time. Mental status is at baseline.     Motor: Weakness (generalized) present.     ED Results / Procedures / Treatments   Labs (all labs ordered are listed, but only abnormal results are displayed) Labs Reviewed  CBC WITH DIFFERENTIAL/PLATELET - Abnormal; Notable for the following components:      Result Value   RBC 3.75 (*)    Hemoglobin 9.4 (*)    HCT 33.7 (*)    MCH 25.1 (*)    MCHC 27.9 (*)    RDW 18.8 (*)    All other components within normal limits  COMPREHENSIVE METABOLIC PANEL - Abnormal; Notable for the following components:   Chloride 94 (*)    CO2 34 (*)    Total Protein 6.3 (*)    Albumin 3.1 (*)    Total Bilirubin 0.1 (*)    All other components within normal limits  BRAIN NATRIURETIC PEPTIDE - Abnormal; Notable for the following components:   B Natriuretic Peptide 112.5 (*)    All other components within normal limits  URINALYSIS, ROUTINE W REFLEX MICROSCOPIC  AMMONIA  CBG MONITORING, ED  I-STAT ARTERIAL BLOOD GAS, ED  I-STAT VENOUS BLOOD GAS, ED  TYPE AND SCREEN  TROPONIN I (HIGH SENSITIVITY)  TROPONIN I (HIGH SENSITIVITY)    EKG EKG Interpretation  Date/Time:  Saturday August 07 2022 14:55:54 EST Ventricular Rate:  71 PR Interval:  178 QRS Duration: 84 QT Interval:  382 QTC Calculation: 415 R  Axis:   35 Text Interpretation: Normal sinus rhythm Possible Anterior infarct , age undetermined Abnormal ECG When compared with ECG of 21-Mar-2022 15:37, PREVIOUS ECG IS PRESENT Confirmed by Merrily Pew [phone]) on 08/07/2022 11:03:41 PM  Radiology CT HEAD WO CONTRAST (5MM)  Result Date: 08/07/2022 CLINICAL DATA:  NECK TRAUMA.  LETHARGY. EXAM: CT HEAD WITHOUT CONTRAST CT CERVICAL SPINE WITHOUT CONTRAST TECHNIQUE: Multidetector CT imaging of the head and cervical spine was performed following the standard protocol without intravenous contrast. Multiplanar CT image reconstructions of the cervical spine were also generated. RADIATION DOSE REDUCTION: This exam was performed according to the departmental dose-optimization program which includes automated exposure control, adjustment of the mA and/or kV according to patient size and/or use of iterative reconstruction technique. COMPARISON:  MRI brain 04/09/2022 FINDINGS: CT HEAD FINDINGS Brain: No evidence of acute infarction, hemorrhage, hydrocephalus, extra-axial collection or mass lesion/mass effect. Vascular: No hyperdense vessel or unexpected calcification. Skull: Normal. Negative for fracture or focal lesion. Sinuses/Orbits: Paranasal sinuses are clear. Partial opacification of the right mastoid air cells. Other: Exam detail is diminished due to motion artifact. Additionally, streak artifact from patient's bilateral earrings and dental amalgam diminishes detail at the level of the posterior fossa. CT CERVICAL SPINE FINDINGS Alignment: Examination is mildly motion limited. Anterolisthesis of C5 on C6 measures 5 mm. No signs of acute post-traumatic mild alignment of the cervical spine. Skull base and vertebrae: No acute fracture. No primary bone lesion or focal pathologic process. Soft tissues and spinal canal: No prevertebral fluid or swelling. No visible canal hematoma. Disc levels: There is marked disc space narrowing and endplate spurring identified C6-7.  Moderate disc space narrowing with vacuum disc is noted at C5-6. Bilateral facet arthropathy noted. Upper chest: Fibrosis and masslike  architectural distortion is identified within the right apex which appears unchanged from 04/27/2022. This area was previously characterized as likely representing sequelae of external beam radiation. No acute abnormality noted. Other: None IMPRESSION: 1. No acute intracranial abnormality. 2. No evidence for cervical spine fracture or subluxation. 3. 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 4. Fibrosis and masslike architectural distortion is identified within the right apex which appears unchanged from 04/27/2022. This area was previously characterized as likely representing sequelae of external beam radiation. Electronically Signed   By: Kerby Moors M.D.   On: 08/07/2022 17:02   CT Cervical Spine Wo Contrast  Result Date: 08/07/2022 CLINICAL DATA:  NECK TRAUMA.  LETHARGY. EXAM: CT HEAD WITHOUT CONTRAST CT CERVICAL SPINE WITHOUT CONTRAST TECHNIQUE: Multidetector CT imaging of the head and cervical spine was performed following the standard protocol without intravenous contrast. Multiplanar CT image reconstructions of the cervical spine were also generated. RADIATION DOSE REDUCTION: This exam was performed according to the departmental dose-optimization program which includes automated exposure control, adjustment of the mA and/or kV according to patient size and/or use of iterative reconstruction technique. COMPARISON:  MRI brain 04/09/2022 FINDINGS: CT HEAD FINDINGS Brain: No evidence of acute infarction, hemorrhage, hydrocephalus, extra-axial collection or mass lesion/mass effect. Vascular: No hyperdense vessel or unexpected calcification. Skull: Normal. Negative for fracture or focal lesion. Sinuses/Orbits: Paranasal sinuses are clear. Partial opacification of the right mastoid air cells. Other: Exam detail is diminished due to motion artifact.  Additionally, streak artifact from patient's bilateral earrings and dental amalgam diminishes detail at the level of the posterior fossa. CT CERVICAL SPINE FINDINGS Alignment: Examination is mildly motion limited. Anterolisthesis of C5 on C6 measures 5 mm. No signs of acute post-traumatic mild alignment of the cervical spine. Skull base and vertebrae: No acute fracture. No primary bone lesion or focal pathologic process. Soft tissues and spinal canal: No prevertebral fluid or swelling. No visible canal hematoma. Disc levels: There is marked disc space narrowing and endplate spurring identified C6-7. Moderate disc space narrowing with vacuum disc is noted at C5-6. Bilateral facet arthropathy noted. Upper chest: Fibrosis and masslike architectural distortion is identified within the right apex which appears unchanged from 04/27/2022. This area was previously characterized as likely representing sequelae of external beam radiation. No acute abnormality noted. Other: None IMPRESSION: 1. No acute intracranial abnormality. 2. No evidence for cervical spine fracture or subluxation. 3. 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 4. Fibrosis and masslike architectural distortion is identified within the right apex which appears unchanged from 04/27/2022. This area was previously characterized as likely representing sequelae of external beam radiation. Electronically Signed   By: Kerby Moors M.D.   On: 08/07/2022 17:02   DG Pelvis 1-2 Views  Result Date: 08/07/2022 CLINICAL DATA:  Fall EXAM: PELVIS - 1-2 VIEW COMPARISON:  None Available. FINDINGS: There is no evidence of pelvic fracture or diastasis. No pelvic bone lesions are seen. Multilevel degenerate disc disease of the lower lumbar spine. IMPRESSION: Negative. Electronically Signed   By: Keane Police D.O.   On: 08/07/2022 16:39   DG Forearm Right  Result Date: 08/07/2022 CLINICAL DATA:  Fall, pain. EXAM: RIGHT FOREARM - 2 VIEW COMPARISON:  None  Available. FINDINGS: There is no evidence of fracture or other focal bone lesions. Soft tissues are unremarkable. IMPRESSION: Negative. Electronically Signed   By: Keane Police D.O.   On: 08/07/2022 16:36   DG Chest 2 View  Result Date: 08/07/2022 CLINICAL DATA:  Failure to thrive, difficult to  arouse EXAM: CHEST - 2 VIEW COMPARISON:  Chest x-ray April 09, 2022 FINDINGS: The cardiomediastinal silhouette is unchanged in contour. Left basilar bandlike opacity. No pleural effusion or pneumothorax. Surgical clips in the right axilla and right upper quadrant. No acute osseous abnormality. IMPRESSION: Left basilar bandlike opacity, likely atelectasis. Electronically Signed   By: Beryle Flock M.D.   On: 08/07/2022 16:35    Procedures Procedures  {Document cardiac monitor, telemetry assessment procedure when appropriate:1}  Medications Ordered in ED Medications - No data to display  ED Course/ Medical Decision Making/ A&P                           Medical Decision Making Amount and/or Complexity of Data Reviewed Labs: ordered. Radiology: ordered.  Difficult to ascertain if this is just chronic wasting from her myasthenia however it does not help that she has not had her Synthroid or Mestinon since she left the hospital approximately 7 days ago.  Also appears to be slightly dehydrated on exam with sunken eyes, dry mouth and no skin turgor and soft pressures.  Sound like she had appropriate feeds but may just not be taking enough water with it.  Does seem like the daughter is doing a very good job of taking care of her however is likely limited in her abilities without all the medications or medical knowledge that the nurse would provide.  Abdominal exam does show macerated skin around her PEG tube which is likely cause of a lot of her symptoms however we will get a CT scan to make sure there is no abscess or other complication from the PEG tube.  We will start with fluid hydration and given her home  medications including pain medicine.  I suspect that she is getting needs some type of barrier cream or yeast cream. ***  {Document critical care time when appropriate:1} {Document review of labs and clinical decision tools ie heart score, Chads2Vasc2 etc:1}  {Document your independent review of radiology images, and any outside records:1} {Document your discussion with family members, caretakers, and with consultants:1} {Document social determinants of health affecting pt's care:1} {Document your decision making why or why not admission, treatments were needed:1} Final Clinical Impression(s) / ED Diagnoses Final diagnoses:  None    Rx / DC Orders ED Discharge Orders     None

## 2022-08-07 NOTE — ED Provider Triage Note (Signed)
Emergency Medicine Provider Triage Evaluation Note  [name] , Hopkins 76 y.o. female  was evaluated in triage.  Pt complains of her to thrive and lethargy.  Family states they were unable to arouse her earlier today.  Patient states that is because her family gave her too much meds.  Patient appears very pale.  States she has some shortness of breath and feels very weak.  She denies any bloody stool.  No pain. Per EMS, patient with multiple falls. No anticoagulation   Review of Systems  Positive: Sob, lethargic Negative:   Physical Exam  There were no vitals taken for this visit. Gen:   Awake, no distress   Resp:  Normal effort  MSK:   Moves extremities without difficulty, swelling to right forearm(hx of lymphedema from mastectomy) SKIN:  Appear pale Other:    Medical Decision Making  Medically screening exam initiated at 2:54 PM.  Appropriate orders placed.  [name] was informed that the remainder of the evaluation will be completed by another provider, this initial triage assessment does not replace that evaluation, and the importance of remaining in the ED until their evaluation is complete.  Fall, FTT, SOB     Barbara Deford A, PA-C 08/07/22 1457

## 2022-08-07 NOTE — Telephone Encounter (Addendum)
   The patient's daughter called the answering service after-hours today to let Dr. Debara Pickett know that they are on their way to [name] so her mother can be evaluated for chest pain. She states that her heart has not been beating correctly. Her mental status has been waxing and waning. Daughter said that patient doesn't really trust anybody but Dr. Debara Pickett so wanted to let him know. I told her the ER has a protocol in which they triage and evaluate patients, and that the ER will notify the cardiology service if they identify that there is a need for consult. Daughter wanted to make sure Dr. Debara Pickett knew and I reassured her I would forward him this information. Will cc to him as FYI.  Charlie Pitter, PA-C

## 2022-08-08 ENCOUNTER — Telehealth: Payer: Self-pay | Admitting: Pulmonary Disease

## 2022-08-08 ENCOUNTER — Encounter (HOSPITAL_COMMUNITY): Payer: Self-pay | Admitting: Internal Medicine

## 2022-08-08 ENCOUNTER — Emergency Department (HOSPITAL_COMMUNITY): Payer: Medicare Other

## 2022-08-08 DIAGNOSIS — Z681 Body mass index (BMI) 19 or less, adult: Secondary | ICD-10-CM | POA: Diagnosis not present

## 2022-08-08 DIAGNOSIS — E871 Hypo-osmolality and hyponatremia: Secondary | ICD-10-CM | POA: Diagnosis present

## 2022-08-08 DIAGNOSIS — Z1152 Encounter for screening for COVID-19: Secondary | ICD-10-CM | POA: Diagnosis not present

## 2022-08-08 DIAGNOSIS — J441 Chronic obstructive pulmonary disease with (acute) exacerbation: Secondary | ICD-10-CM | POA: Diagnosis present

## 2022-08-08 DIAGNOSIS — J9622 Acute and chronic respiratory failure with hypercapnia: Secondary | ICD-10-CM | POA: Diagnosis present

## 2022-08-08 DIAGNOSIS — I350 Nonrheumatic aortic (valve) stenosis: Secondary | ICD-10-CM

## 2022-08-08 DIAGNOSIS — J439 Emphysema, unspecified: Secondary | ICD-10-CM | POA: Diagnosis present

## 2022-08-08 DIAGNOSIS — I25810 Atherosclerosis of coronary artery bypass graft(s) without angina pectoris: Secondary | ICD-10-CM | POA: Diagnosis present

## 2022-08-08 DIAGNOSIS — K5904 Chronic idiopathic constipation: Secondary | ICD-10-CM | POA: Diagnosis not present

## 2022-08-08 DIAGNOSIS — I272 Pulmonary hypertension, unspecified: Secondary | ICD-10-CM | POA: Diagnosis present

## 2022-08-08 DIAGNOSIS — Z955 Presence of coronary angioplasty implant and graft: Secondary | ICD-10-CM | POA: Diagnosis not present

## 2022-08-08 DIAGNOSIS — F17210 Nicotine dependence, cigarettes, uncomplicated: Secondary | ICD-10-CM | POA: Diagnosis present

## 2022-08-08 DIAGNOSIS — I5032 Chronic diastolic (congestive) heart failure: Secondary | ICD-10-CM

## 2022-08-08 DIAGNOSIS — J9621 Acute and chronic respiratory failure with hypoxia: Secondary | ICD-10-CM

## 2022-08-08 DIAGNOSIS — F411 Generalized anxiety disorder: Secondary | ICD-10-CM | POA: Diagnosis present

## 2022-08-08 DIAGNOSIS — Z9981 Dependence on supplemental oxygen: Secondary | ICD-10-CM | POA: Diagnosis not present

## 2022-08-08 DIAGNOSIS — E875 Hyperkalemia: Secondary | ICD-10-CM | POA: Diagnosis not present

## 2022-08-08 DIAGNOSIS — I10 Essential (primary) hypertension: Secondary | ICD-10-CM

## 2022-08-08 DIAGNOSIS — E440 Moderate protein-calorie malnutrition: Secondary | ICD-10-CM | POA: Diagnosis present

## 2022-08-08 DIAGNOSIS — E785 Hyperlipidemia, unspecified: Secondary | ICD-10-CM | POA: Diagnosis present

## 2022-08-08 DIAGNOSIS — N1831 Chronic kidney disease, stage 3a: Secondary | ICD-10-CM | POA: Diagnosis present

## 2022-08-08 DIAGNOSIS — J479 Bronchiectasis, uncomplicated: Secondary | ICD-10-CM | POA: Diagnosis present

## 2022-08-08 DIAGNOSIS — I252 Old myocardial infarction: Secondary | ICD-10-CM | POA: Diagnosis not present

## 2022-08-08 DIAGNOSIS — E876 Hypokalemia: Secondary | ICD-10-CM | POA: Diagnosis not present

## 2022-08-08 DIAGNOSIS — G9341 Metabolic encephalopathy: Secondary | ICD-10-CM | POA: Diagnosis present

## 2022-08-08 DIAGNOSIS — I130 Hypertensive heart and chronic kidney disease with heart failure and stage 1 through stage 4 chronic kidney disease, or unspecified chronic kidney disease: Secondary | ICD-10-CM | POA: Diagnosis present

## 2022-08-08 LAB — I-STAT ARTERIAL BLOOD GAS, ED
Acid-Base Excess: 10 mmol/L — ABNORMAL HIGH (ref 0.0–2.0)
Acid-Base Excess: 8 mmol/L — ABNORMAL HIGH (ref 0.0–2.0)
Acid-Base Excess: 9 mmol/L — ABNORMAL HIGH (ref 0.0–2.0)
Acid-Base Excess: 9 mmol/L — ABNORMAL HIGH (ref 0.0–2.0)
Bicarbonate: 37.5 mmol/L — ABNORMAL HIGH (ref 20.0–28.0)
Bicarbonate: 37.5 mmol/L — ABNORMAL HIGH (ref 20.0–28.0)
Bicarbonate: 38.4 mmol/L — ABNORMAL HIGH (ref 20.0–28.0)
Bicarbonate: 39.4 mmol/L — ABNORMAL HIGH (ref 20.0–28.0)
Calcium, Ion: 1.24 mmol/L (ref 1.15–1.40)
Calcium, Ion: 1.25 mmol/L (ref 1.15–1.40)
Calcium, Ion: 1.25 mmol/L (ref 1.15–1.40)
Calcium, Ion: 1.26 mmol/L (ref 1.15–1.40)
HCT: 29 % — ABNORMAL LOW (ref 36.0–46.0)
HCT: 30 % — ABNORMAL LOW (ref 36.0–46.0)
HCT: 31 % — ABNORMAL LOW (ref 36.0–46.0)
HCT: 35 % — ABNORMAL LOW (ref 36.0–46.0)
Hemoglobin: 10.2 g/dL — ABNORMAL LOW (ref 12.0–15.0)
Hemoglobin: 10.5 g/dL — ABNORMAL LOW (ref 12.0–15.0)
Hemoglobin: 11.9 g/dL — ABNORMAL LOW (ref 12.0–15.0)
Hemoglobin: 9.9 g/dL — ABNORMAL LOW (ref 12.0–15.0)
O2 Saturation: 90 %
O2 Saturation: 95 %
O2 Saturation: 95 %
O2 Saturation: 97 %
Patient temperature: 97.3
Patient temperature: 97.5
Patient temperature: 97.6
Potassium: 4 mmol/L (ref 3.5–5.1)
Potassium: 4.1 mmol/L (ref 3.5–5.1)
Potassium: 4.1 mmol/L (ref 3.5–5.1)
Potassium: 4.1 mmol/L (ref 3.5–5.1)
Sodium: 133 mmol/L — ABNORMAL LOW (ref 135–145)
Sodium: 134 mmol/L — ABNORMAL LOW (ref 135–145)
Sodium: 134 mmol/L — ABNORMAL LOW (ref 135–145)
Sodium: 134 mmol/L — ABNORMAL LOW (ref 135–145)
TCO2: 40 mmol/L — ABNORMAL HIGH (ref 22–32)
TCO2: 40 mmol/L — ABNORMAL HIGH (ref 22–32)
TCO2: 41 mmol/L — ABNORMAL HIGH (ref 22–32)
TCO2: 42 mmol/L — ABNORMAL HIGH (ref 22–32)
pCO2 arterial: 73.5 mmHg (ref 32–48)
pCO2 arterial: 79.2 mmHg (ref 32–48)
pCO2 arterial: 80.7 mmHg (ref 32–48)
pCO2 arterial: 88.2 mmHg (ref 32–48)
pH, Arterial: 7.255 — ABNORMAL LOW (ref 7.35–7.45)
pH, Arterial: 7.271 — ABNORMAL LOW (ref 7.35–7.45)
pH, Arterial: 7.293 — ABNORMAL LOW (ref 7.35–7.45)
pH, Arterial: 7.313 — ABNORMAL LOW (ref 7.35–7.45)
pO2, Arterial: 103 mmHg (ref 83–108)
pO2, Arterial: 68 mmHg — ABNORMAL LOW (ref 83–108)
pO2, Arterial: 85 mmHg (ref 83–108)
pO2, Arterial: 89 mmHg (ref 83–108)

## 2022-08-08 LAB — BASIC METABOLIC PANEL
Anion gap: 8 (ref 5–15)
BUN: 9 mg/dL (ref 8–23)
CO2: 32 mmol/L (ref 22–32)
Calcium: 8.9 mg/dL (ref 8.9–10.3)
Chloride: 93 mmol/L — ABNORMAL LOW (ref 98–111)
Creatinine, Ser: 0.74 mg/dL (ref 0.44–1.00)
GFR, Estimated: >60 mL/min (ref >60)
Glucose, Bld: 97 mg/dL (ref 70–99)
Potassium: 5.2 mmol/L — ABNORMAL HIGH (ref 3.5–5.1)
Sodium: 133 mmol/L — ABNORMAL LOW (ref 135–145)

## 2022-08-08 LAB — RESPIRATORY PANEL BY PCR

## 2022-08-08 LAB — I-STAT VENOUS BLOOD GAS, ED
Acid-Base Excess: 14 mmol/L — ABNORMAL HIGH (ref 0.0–2.0)
Bicarbonate: 36.7 mmol/L — ABNORMAL HIGH (ref 20.0–28.0)
Calcium, Ion: 0.95 mmol/L — ABNORMAL LOW (ref 1.15–1.40)
HCT: 30 % — ABNORMAL LOW (ref 36.0–46.0)
Hemoglobin: 10.2 g/dL — ABNORMAL LOW (ref 12.0–15.0)
O2 Saturation: 99 %
Potassium: 4.6 mmol/L (ref 3.5–5.1)
Sodium: 132 mmol/L — ABNORMAL LOW (ref 135–145)
TCO2: 38 mmol/L — ABNORMAL HIGH (ref 22–32)
pCO2, Ven: 38 mmHg — ABNORMAL LOW (ref 44–60)
pH, Ven: 7.593 — ABNORMAL HIGH (ref 7.25–7.43)
pO2, Ven: 122 mmHg — ABNORMAL HIGH (ref 32–45)

## 2022-08-08 LAB — RESP PANEL BY RT-PCR (FLU A&B, COVID) ARPGX2
Influenza A by PCR: NEGATIVE
Influenza B by PCR: NEGATIVE
SARS Coronavirus 2 by RT PCR: NEGATIVE

## 2022-08-08 LAB — URINALYSIS, ROUTINE W REFLEX MICROSCOPIC
Bilirubin Urine: NEGATIVE
Glucose, UA: NEGATIVE mg/dL
Hgb urine dipstick: NEGATIVE
Ketones, ur: NEGATIVE mg/dL
Leukocytes,Ua: NEGATIVE
Nitrite: NEGATIVE
Protein, ur: NEGATIVE mg/dL
Specific Gravity, Urine: 1.017 (ref 1.005–1.030)
pH: 5 (ref 5.0–8.0)

## 2022-08-08 LAB — AMMONIA: Ammonia: 34 umol/L (ref 9–35)

## 2022-08-08 MED ORDER — METHYLPREDNISOLONE SODIUM SUCC 125 MG IJ SOLR
125.0000 mg | Freq: Once | INTRAMUSCULAR | Status: AC
  Administered 2022-08-08: 125 mg via INTRAVENOUS
  Filled 2022-08-08: qty 2

## 2022-08-08 MED ORDER — ARFORMOTEROL TARTRATE 15 MCG/2ML IN NEBU
15.0000 ug | INHALATION_SOLUTION | Freq: Two times a day (BID) | RESPIRATORY_TRACT | Status: DC
  Administered 2022-08-08: 15 ug via RESPIRATORY_TRACT
  Filled 2022-08-08: qty 2

## 2022-08-08 MED ORDER — ACETAMINOPHEN 650 MG RE SUPP: 650.0000 mg | RECTAL | Status: DC | PRN

## 2022-08-08 MED ORDER — BUDESONIDE 0.25 MG/2ML IN SUSP
0.2500 mg | Freq: Two times a day (BID) | RESPIRATORY_TRACT | Status: DC
  Administered 2022-08-08: 0.25 mg via RESPIRATORY_TRACT
  Filled 2022-08-08: qty 2

## 2022-08-08 MED ORDER — IOHEXOL 350 MG/ML SOLN
75.0000 mL | Freq: Once | INTRAVENOUS | Status: AC | PRN
  Administered 2022-08-08: 75 mL via INTRAVENOUS

## 2022-08-08 MED ORDER — PREDNISONE 20 MG PO TABS
40.0000 mg | ORAL_TABLET | Freq: Every day | ORAL | Status: DC
  Administered 2022-08-09 – 2022-08-12 (×4): 40 mg via ORAL
  Filled 2022-08-08 (×4): qty 2

## 2022-08-08 MED ORDER — LINACLOTIDE 145 MCG PO CAPS
145.0000 ug | ORAL_CAPSULE | Freq: Every day | ORAL | Status: DC
  Administered 2022-08-08 – 2022-08-12 (×5): 145 ug via ORAL
  Filled 2022-08-08 (×5): qty 1

## 2022-08-08 MED ORDER — IPRATROPIUM-ALBUTEROL 0.5-2.5 (3) MG/3ML IN SOLN: 3.0000 mL | RESPIRATORY_TRACT | Status: DC | PRN

## 2022-08-08 MED ORDER — SODIUM CHLORIDE 0.9 % IV SOLN
500.0000 mg | INTRAVENOUS | Status: DC
  Administered 2022-08-08 – 2022-08-09 (×2): 500 mg via INTRAVENOUS
  Filled 2022-08-08 (×2): qty 5

## 2022-08-08 MED ORDER — IPRATROPIUM-ALBUTEROL 0.5-2.5 (3) MG/3ML IN SOLN
3.0000 mL | RESPIRATORY_TRACT | Status: DC
  Administered 2022-08-08: 3 mL via RESPIRATORY_TRACT
  Filled 2022-08-08: qty 3

## 2022-08-08 MED ORDER — ALBUTEROL SULFATE (2.5 MG/3ML) 0.083% IN NEBU
5.0000 mg | INHALATION_SOLUTION | Freq: Once | RESPIRATORY_TRACT | Status: DC
  Filled 2022-08-08: qty 6

## 2022-08-08 MED ORDER — MOMETASONE FURO-FORMOTEROL FUM 200-5 MCG/ACT IN AERO
2.0000 | INHALATION_SPRAY | Freq: Two times a day (BID) | RESPIRATORY_TRACT | Status: DC
  Administered 2022-08-10 – 2022-08-12 (×5): 2 via RESPIRATORY_TRACT
  Filled 2022-08-08 (×2): qty 8.8

## 2022-08-08 MED ORDER — METHYLPREDNISOLONE SODIUM SUCC 40 MG IJ SOLR: 40.0000 mg | Freq: Four times a day (QID) | INTRAMUSCULAR | Status: DC

## 2022-08-08 MED ORDER — IPRATROPIUM-ALBUTEROL 0.5-2.5 (3) MG/3ML IN SOLN
3.0000 mL | Freq: Two times a day (BID) | RESPIRATORY_TRACT | Status: DC
  Administered 2022-08-09 – 2022-08-12 (×7): 3 mL via RESPIRATORY_TRACT
  Filled 2022-08-08 (×8): qty 3

## 2022-08-08 MED ORDER — REVEFENACIN 175 MCG/3ML IN SOLN: 175.0000 ug | Freq: Every day | RESPIRATORY_TRACT | Status: DC

## 2022-08-08 MED ORDER — HEPARIN SODIUM (PORCINE) 5000 UNIT/ML IJ SOLN
5000.0000 [IU] | Freq: Three times a day (TID) | INTRAMUSCULAR | Status: DC
  Administered 2022-08-08 – 2022-08-10 (×6): 5000 [IU] via SUBCUTANEOUS
  Filled 2022-08-08 (×5): qty 1

## 2022-08-08 MED ORDER — ALBUTEROL SULFATE (2.5 MG/3ML) 0.083% IN NEBU
10.0000 mg/h | INHALATION_SOLUTION | RESPIRATORY_TRACT | Status: DC
  Administered 2022-08-08: 10 mg/h via RESPIRATORY_TRACT
  Filled 2022-08-08: qty 12

## 2022-08-08 MED ORDER — LORAZEPAM 2 MG/ML IJ SOLN
0.5000 mg | Freq: Once | INTRAMUSCULAR | Status: AC
  Administered 2022-08-08: 0.5 mg via INTRAVENOUS
  Filled 2022-08-08: qty 1

## 2022-08-08 MED ORDER — IPRATROPIUM-ALBUTEROL 0.5-2.5 (3) MG/3ML IN SOLN
3.0000 mL | Freq: Four times a day (QID) | RESPIRATORY_TRACT | Status: DC
  Administered 2022-08-08 (×2): 3 mL via RESPIRATORY_TRACT
  Filled 2022-08-08 (×2): qty 3

## 2022-08-08 MED ORDER — SENNOSIDES-DOCUSATE SODIUM 8.6-50 MG PO TABS
1.0000 | ORAL_TABLET | Freq: Two times a day (BID) | ORAL | Status: DC
  Administered 2022-08-08 – 2022-08-12 (×9): 1 via ORAL
  Filled 2022-08-08 (×9): qty 1

## 2022-08-08 MED ORDER — ALBUTEROL SULFATE (2.5 MG/3ML) 0.083% IN NEBU
INHALATION_SOLUTION | RESPIRATORY_TRACT | Status: AC
  Administered 2022-08-08: 10 mg/h via RESPIRATORY_TRACT
  Filled 2022-08-08: qty 6

## 2022-08-08 MED ORDER — FUROSEMIDE 10 MG/ML IJ SOLN
40.0000 mg | Freq: Two times a day (BID) | INTRAMUSCULAR | Status: DC
  Administered 2022-08-08 – 2022-08-09 (×3): 40 mg via INTRAVENOUS
  Filled 2022-08-08 (×3): qty 4

## 2022-08-08 MED ORDER — IPRATROPIUM BROMIDE 0.02 % IN SOLN
1.5000 mg | Freq: Once | RESPIRATORY_TRACT | Status: AC
  Administered 2022-08-08: 1.5 mg via RESPIRATORY_TRACT
  Filled 2022-08-08: qty 7.5

## 2022-08-08 MED ORDER — METHYLPREDNISOLONE SODIUM SUCC 125 MG IJ SOLR: 80.0000 mg | Freq: Two times a day (BID) | INTRAMUSCULAR | Status: DC

## 2022-08-08 MED ORDER — ALBUTEROL SULFATE (2.5 MG/3ML) 0.083% IN NEBU: 10.0000 mg/h | INHALATION_SOLUTION | RESPIRATORY_TRACT | Status: DC

## 2022-08-08 MED ORDER — PANTOPRAZOLE SODIUM 40 MG IV SOLR
40.0000 mg | Freq: Two times a day (BID) | INTRAVENOUS | Status: DC
  Administered 2022-08-08 – 2022-08-09 (×3): 40 mg via INTRAVENOUS
  Filled 2022-08-08 (×3): qty 10

## 2022-08-08 NOTE — Progress Notes (Addendum)
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  Patient was very lethargic.  Patient reportedly has been falling at home.  End-tidal CO2 between 35 and 45 by EMS.  Initial ABG showed pH 7.27, PCO2 80, PO2 68 patient was placed on BiPAP.  Repeated blood gas an hour later showed worsening hypercapnia with pH of 7.25 PCO2 88.  Patient was giving albuterol and a steroid and continue on BiPAP.  Subsequent blood gas was repeated with a pH of 7.3, PCO2 73, PO2 85.  Patient admitted for hypercapnic respiratory failure.   Assessment & Plan:   Principal Problem:   COPD with acute exacerbation ([facility]) Active Problems:   Acute on chronic respiratory failure with hypoxia and hypercapnia (HCC) - on home O2 @ 2 L/min   Moderate to severe aortic stenosis - Last echo 04/2022 showed VTI measures 0.81 cm. Aortic valve mean gradient measures 30.7 mmHg. Aortic valve Vmax measures 3.74 m/s.   Chronic diastolic CHF (congestive heart failure) (HCC)   HTN (hypertension)   Chronic idiopathic constipation   Hyperlipidemia LDL goal <70   1-Acute on Chronic respiratory failure with hypoxia and hypercapnia on chronic 2 L of oxygen: Acute COPD exacerbation:  Patient presented with altered mental status, elevated PCO2, pH 7.2.  Continue with BiPAP CT chest negative for pneumonia. Similar scattered mild cylindrical bronchiectasis with trace patchy tree-in-bud nodularity. Plan to check for respiratory panel, covid, daughter is sick with viral illness.  Continue with BIPAP. She is more alert.  Repeat ABG.  Plan to add Brovana and pulmicort.  Continue with IV steroids.   Continue duoneb.  CCM/pulmonologist consulted.   2-Chronic diastolic heart failure: Moderate to severe aortic stenosis Placed on IV lasix BID>  Monitor renal function.    3-Hyperlipidemia; resume statin when tolerating oral.   Chronic idiopathic constipation Resume Linzess.  Start senna-docusate.  Enema ordered.   Hypertension: on IV lasix.   Acute metabolic encephalopathy; presented lethargic.  Secondary to Hypercapnic respiratory failure.  Improving with BIPAP Monitor.  Holding benzos.   Current smoker. Counseling.  Mild hyponatremia; monitor on diuretic.     Estimated body mass index is 19.57 kg/m as calculated from the following:   Height as of 07/20/22: '5\' 2"'$  (1.575 m).   Weight as of 07/20/22: 48.5 kg.   DVT prophylaxis: heparin  Code Status: full code Family Communication: Daughter updated Disposition Plan:  Status is: Inpatient Remains inpatient appropriate because: management of resp failure.     Consultants:  CCM  Procedures:  BIPAP  Antimicrobials:    Subjective: She is alert, . She continue to smoke. She wants BIPAP off.   Objective: Vitals:   08/08/22 0345 08/08/22 0400 08/08/22 0430 08/08/22 0500  BP: (!) 153/64 (!) 146/60  (!) 162/59  Pulse:  65  63  Resp: '18 18  19  '$ Temp:   97.6 F (36.4 C)   TempSrc:   Axillary   SpO2:  99%  98%    Intake/Output Summary (Last 24 hours) at 08/08/2022 0655 Last data filed at 08/08/2022 0023 Gross per 24 hour  Intake --  Output 1000 ml  Net -1000 ml   There were no vitals filed for this visit.  Examination:  General exam: Appears calm and comfortable  Respiratory system: decreased breath sounds, on BIPAP/  Cardiovascular system: S1 & S2 heard, RRR.  Gastrointestinal system: Abdomen is nondistended, soft and nontender. No organomegaly or masses felt. Normal bowel sounds heard. Central nervous system: Alert  Extremities: Symmetric 5 x 5 power.    Data Reviewed: I have personally  reviewed following labs and imaging studies  CBC: Recent Labs  Lab 08/07/22 1525 08/08/22 0009 08/08/22 0054 08/08/22 0252 08/08/22 0444  WBC 5.7  --   --   --   --   NEUTROABS 4.0  --   --   --   --   HGB 9.4* 10.2* 9.9* 10.2* 10.5*  HCT 33.7* 30.0* 29.0* 30.0* 31.0*  MCV 89.9  --   --   --   --   PLT 289  --   --   --   --    Basic Metabolic Panel: Recent Labs  Lab 08/07/22 1525 08/08/22 0009 08/08/22 0054 08/08/22 0252 08/08/22 0444  NA 137 132* 133* 134* 134*  K 4.8 4.6 4.0 4.1 4.1  CL 94*  --   --   --   --   CO2 34*  --   --   --   --   GLUCOSE 83  --   --   --   --   BUN 10  --   --   --   --   CREATININE 0.71  --   --   --   --   CALCIUM 9.2  --   --   --   --    GFR: CrCl cannot be calculated (Unknown ideal weight.). Liver Function Tests: Recent Labs  Lab 08/07/22 1525  AST 17  ALT 12  ALKPHOS 45  BILITOT 0.1*  PROT 6.3*  ALBUMIN 3.1*   No results for input(s): "LIPASE", "AMYLASE" in the last 168 hours. Recent Labs  Lab 08/08/22 0003  AMMONIA 34   Coagulation Profile: No results for input(s): "INR", "PROTIME" in the last 168 hours. Cardiac Enzymes: No results for input(s): "CKTOTAL", "CKMB", "CKMBINDEX", "TROPONINI" in the last 168 hours. BNP (last 3 results) No results for input(s): "PROBNP" in the last 8760 hours. HbA1C: No results for input(s): "HGBA1C" in the last 72 hours. CBG: Recent Labs  Lab 08/07/22 2249  GLUCAP 81   Lipid Profile: No results for input(s): "CHOL", "HDL", "LDLCALC", "TRIG", "CHOLHDL", "LDLDIRECT" in the last 72 hours. Thyroid Function Tests: No results for input(s): "TSH", "T4TOTAL", "FREET4", "T3FREE", "THYROIDAB" in the last 72 hours. Anemia Panel: No results for input(s): "VITAMINB12", "FOLATE", "FERRITIN", "TIBC", "IRON", "RETICCTPCT" in the last 72 hours. Sepsis Labs: No results for input(s): "PROCALCITON", "LATICACIDVEN" in the last 168 hours.  No results found for this or any previous visit (from the  past 240 hour(s)).       Radiology Studies: CT CHEST ABDOMEN PELVIS W CONTRAST  Result Date: 08/08/2022 CLINICAL DATA:  Weight loss, unintended EXAM: CT CHEST, ABDOMEN, AND PELVIS WITH CONTRAST TECHNIQUE: Multidetector CT imaging of the chest, abdomen and pelvis was performed following the standard protocol during bolus administration of intravenous contrast. RADIATION DOSE REDUCTION: This exam was performed according to the departmental dose-optimization program which includes automated exposure control, adjustment of the mA and/or kV according to patient size and/or use of iterative reconstruction technique. CONTRAST:  49m OMNIPAQUE IOHEXOL 350 MG/ML SOLN COMPARISON:  CT chest 04/27/2022, CT chest 01/01/2015 FINDINGS: CT CHEST FINDINGS Cardiovascular: Normal heart size. Query  interatrial connection. No significant pericardial effusion. The thoracic aorta is normal in caliber. Moderate to severe atherosclerotic plaque of the thoracic aorta. Four-vessel coronary artery calcifications. Aortic valve leaflet calcifications. Mediastinum/Nodes: No enlarged mediastinal, hilar, or axillary lymph nodes. Thyroid gland, trachea, and esophagus demonstrate no significant findings. Lungs/Pleura: Mild centrilobular emphysematous changes. Chronic similar-appearing right masslike apical consolidation with air bronchograms. Redemonstration of anterior right upper lobe radiation fibrosis. Stable chronic left lower lobe subpleural 5 x 4 mm pulmonary nodule. No new pulmonary nodule. Similar scattered mild cylindrical bronchiectasis with trace patchy tree-in-bud nodularity. No pulmonary mass. No pleural effusion. No pneumothorax. Musculoskeletal: No chest wall abnormality. No suspicious lytic or blastic osseous lesions. No acute displaced fracture. Old healed left rib fractures. Multilevel degenerative changes of the spine. CT ABDOMEN PELVIS FINDINGS Hepatobiliary: The hepatic parenchyma is diffusely hypodense compared to the  splenic parenchyma consistent with fatty infiltration. No focal liver abnormality. Status post cholecystectomy no biliary dilatation. Pancreas: No focal lesion. Normal pancreatic contour. No surrounding inflammatory changes. No main pancreatic ductal dilatation. Spleen: Normal in size without focal abnormality. Adrenals/Urinary Tract: No adrenal nodule bilaterally. Bilateral kidneys enhance symmetrically. Hypodense lesions likely represent simple renal cysts. Simple renal cysts, in the absence of clinically indicated signs/symptoms, require no independent follow-up. No hydronephrosis. No hydroureter. The urinary bladder is unremarkable. On delayed imaging, there is no urothelial wall thickening and there are no filling defects in the opacified portions of the bilateral collecting systems or ureters. Stomach/Bowel: Partial colectomy. Stomach is within normal limits. No evidence of bowel wall thickening or dilatation. Under distension of the rectum. Stool throughout the colon. Vascular/Lymphatic: No abdominal aorta or iliac aneurysm. Severe atherosclerotic plaque of the aorta and its branches. No abdominal, pelvic, or inguinal lymphadenopathy. Reproductive: Uterus and bilateral adnexa are unremarkable. Other: No intraperitoneal free fluid. No intraperitoneal free gas. No organized fluid collection. Musculoskeletal: Moderate volume infraumbilical ventral hernia containing mesentery and a long loop of small bowel. No suspicious lytic or blastic osseous lesions. No acute displaced fracture. Chronic stable L1 and L3 compression fractures. Similar-appearing grade 1 anterolisthesis of L4 on L5 and L5 on S1. Multilevel degenerative changes of the spine. Degenerative changes of the right hip. IMPRESSION: 1. Similar scattered mild cylindrical bronchiectasis with trace patchy tree-in-bud nodularity. 2. Moderate volume infraumbilical ventral hernia containing mesentery and a long loop of small bowel. No associated findings  suggest bowel obstruction or ischemia. 3. Increased stool burden throughout the colon-correlate clinically for constipation. 4. Aortic Atherosclerosis (ICD10-I70.0) including aortic valve leaflet calcifications and coronary artery calcification. Correlate with aortic stenosis. 5. Emphysema (ICD10-J43.9). Electronically Signed   By: Iven Finn M.D.   On: 08/08/2022 01:29   CT HEAD WO CONTRAST (5MM)  Result Date: 08/07/2022 CLINICAL DATA:  NECK TRAUMA.  LETHARGY. EXAM: CT HEAD WITHOUT CONTRAST CT CERVICAL SPINE WITHOUT CONTRAST TECHNIQUE: Multidetector CT imaging of the head and cervical spine was performed following the standard protocol without intravenous contrast. Multiplanar CT image reconstructions of the cervical spine were also generated. RADIATION DOSE REDUCTION: This exam was performed according to the departmental dose-optimization program which includes automated exposure control, adjustment of the mA and/or kV according to patient size and/or use of iterative reconstruction technique. COMPARISON:  MRI brain 04/09/2022 FINDINGS: CT HEAD FINDINGS Brain: No evidence of acute infarction, hemorrhage, hydrocephalus, extra-axial collection or mass lesion/mass effect. Vascular: No hyperdense vessel or unexpected calcification. Skull: Normal. Negative for fracture or focal lesion. Sinuses/Orbits: Paranasal sinuses are clear. Partial opacification of the right mastoid air cells. Other: Exam  detail is diminished due to motion artifact. Additionally, streak artifact from patient's bilateral earrings and dental amalgam diminishes detail at the level of the posterior fossa. CT CERVICAL SPINE FINDINGS Alignment: Examination is mildly motion limited. Anterolisthesis of C5 on C6 measures 5 mm. No signs of acute post-traumatic mild alignment of the cervical spine. Skull base and vertebrae: No acute fracture. No primary bone lesion or focal pathologic process. Soft tissues and spinal canal: No prevertebral fluid  or swelling. No visible canal hematoma. Disc levels: There is marked disc space narrowing and endplate spurring identified C6-7. Moderate disc space narrowing with vacuum disc is noted at C5-6. Bilateral facet arthropathy noted. Upper chest: Fibrosis and masslike architectural distortion is identified within the right apex which appears unchanged from 04/27/2022. This area was previously characterized as likely representing sequelae of external beam radiation. No acute abnormality noted. Other: None IMPRESSION: 1. No acute intracranial abnormality. 2. No evidence for cervical spine fracture or subluxation. 3. 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 4. Fibrosis and masslike architectural distortion is identified within the right apex which appears unchanged from 04/27/2022. This area was previously characterized as likely representing sequelae of external beam radiation. Electronically Signed   By: Kerby Moors M.D.   On: 08/07/2022 17:02   CT Cervical Spine Wo Contrast  Result Date: 08/07/2022 CLINICAL DATA:  NECK TRAUMA.  LETHARGY. EXAM: CT HEAD WITHOUT CONTRAST CT CERVICAL SPINE WITHOUT CONTRAST TECHNIQUE: Multidetector CT imaging of the head and cervical spine was performed following the standard protocol without intravenous contrast. Multiplanar CT image reconstructions of the cervical spine were also generated. RADIATION DOSE REDUCTION: This exam was performed according to the departmental dose-optimization program which includes automated exposure control, adjustment of the mA and/or kV according to patient size and/or use of iterative reconstruction technique. COMPARISON:  MRI brain 04/09/2022 FINDINGS: CT HEAD FINDINGS Brain: No evidence of acute infarction, hemorrhage, hydrocephalus, extra-axial collection or mass lesion/mass effect. Vascular: No hyperdense vessel or unexpected calcification. Skull: Normal. Negative for fracture or focal lesion. Sinuses/Orbits: Paranasal sinuses are  clear. Partial opacification of the right mastoid air cells. Other: Exam detail is diminished due to motion artifact. Additionally, streak artifact from patient's bilateral earrings and dental amalgam diminishes detail at the level of the posterior fossa. CT CERVICAL SPINE FINDINGS Alignment: Examination is mildly motion limited. Anterolisthesis of C5 on C6 measures 5 mm. No signs of acute post-traumatic mild alignment of the cervical spine. Skull base and vertebrae: No acute fracture. No primary bone lesion or focal pathologic process. Soft tissues and spinal canal: No prevertebral fluid or swelling. No visible canal hematoma. Disc levels: There is marked disc space narrowing and endplate spurring identified C6-7. Moderate disc space narrowing with vacuum disc is noted at C5-6. Bilateral facet arthropathy noted. Upper chest: Fibrosis and masslike architectural distortion is identified within the right apex which appears unchanged from 04/27/2022. This area was previously characterized as likely representing sequelae of external beam radiation. No acute abnormality noted. Other: None IMPRESSION: 1. No acute intracranial abnormality. 2. No evidence for cervical spine fracture or subluxation. 3. 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 4. Fibrosis and masslike architectural distortion is identified within the right apex which appears unchanged from 04/27/2022. This area was previously characterized as likely representing sequelae of external beam radiation. Electronically Signed   By: Kerby Moors M.D.   On: 08/07/2022 17:02   DG Pelvis 1-2 Views  Result Date: 08/07/2022 CLINICAL DATA:  Fall EXAM: PELVIS - 1-2 VIEW COMPARISON:  None Available. FINDINGS: There is no evidence of pelvic fracture or diastasis. No pelvic bone lesions are seen. Multilevel degenerate disc disease of the lower lumbar spine. IMPRESSION: Negative. Electronically Signed   By: Keane Police D.O.   On: 08/07/2022 16:39    DG Forearm Right  Result Date: 08/07/2022 CLINICAL DATA:  Fall, pain. EXAM: RIGHT FOREARM - 2 VIEW COMPARISON:  None Available. FINDINGS: There is no evidence of fracture or other focal bone lesions. Soft tissues are unremarkable. IMPRESSION: Negative. Electronically Signed   By: Keane Police D.O.   On: 08/07/2022 16:36   DG Chest 2 View  Result Date: 08/07/2022 CLINICAL DATA:  Failure to thrive, difficult to arouse EXAM: CHEST - 2 VIEW COMPARISON:  Chest x-ray April 09, 2022 FINDINGS: The cardiomediastinal silhouette is unchanged in contour. Left basilar bandlike opacity. No pleural effusion or pneumothorax. Surgical clips in the right axilla and right upper quadrant. No acute osseous abnormality. IMPRESSION: Left basilar bandlike opacity, likely atelectasis. Electronically Signed   By: Beryle Flock M.D.   On: 08/07/2022 16:35        Scheduled Meds:  albuterol  5 mg Nebulization Once   furosemide  40 mg Intravenous Q12H   heparin  5,000 Units Subcutaneous Q8H   ipratropium-albuterol  3 mL Nebulization Q4H   methylPREDNISolone (SOLU-MEDROL) injection  80 mg Intravenous Q12H   pantoprazole (PROTONIX) IV  40 mg Intravenous Q12H   Continuous Infusions:  albuterol 10 mg/hr (08/08/22 0540)     LOS: 0 days    Time spent: 35 Minutes.     Elmarie Shiley, MD [facility]   If 7PM-7AM, please contact night-coverage [email or web address]  08/08/2022, 6:55 AM

## 2022-08-08 NOTE — Assessment & Plan Note (Signed)
Stable

## 2022-08-08 NOTE — ED Notes (Signed)
Pt attempting to take BIPAP off, paged admitting MD. Per MD, remove BIPAP and have respiratory come and assess patient. Patient off BIPAP, respiratory at bedside removing BIPAP. Patient placed on 5L [place], oxygen saturation 100% at this time.

## 2022-08-08 NOTE — ED Notes (Signed)
Patient resting at this time. No acute distress noted, equal chest rise and fall. Oxygen maintained at 98% with BIPAP.

## 2022-08-08 NOTE — ED Notes (Signed)
Patient titrated down to 3L [place]

## 2022-08-08 NOTE — Assessment & Plan Note (Signed)
Last echo 04/2022 showed VTI measures 0.81 cm. Aortic valve mean gradient measures 30.7 mmHg. Aortic valve Vmax measures 3.74 m/s.

## 2022-08-08 NOTE — Progress Notes (Signed)
PT placed on bipap per MD

## 2022-08-08 NOTE — Progress Notes (Signed)
Pt taken off Bipap for a break. Pt resting comfortably, no WOB noted.  Pt place on 5L [place]. SpO2 100

## 2022-08-08 NOTE — Assessment & Plan Note (Signed)
Stable. No pulmonary edema on CT chest

## 2022-08-08 NOTE — ED Notes (Signed)
Pt is resting comfortably at this time. Pt has equal chest rise and fall. No acute distress noted at this time.

## 2022-08-08 NOTE — Assessment & Plan Note (Signed)
Constipation noted on CT abd. Given NPO status. Will order enemas.

## 2022-08-08 NOTE — Assessment & Plan Note (Signed)
Systolic blood pressure 130 to 117 mmHg. Continue close blood pressure monitoring.

## 2022-08-08 NOTE — Assessment & Plan Note (Addendum)
Continue with IV solumedrol 40 mg q6h, duonebs q4h. Hold on abx.

## 2022-08-08 NOTE — H&P (Addendum)
History and Physical    [name] DDU:[id number] DOB: [birth year] DOA: 08/07/2022  DOS: the patient was seen and examined on 08/07/2022  PCP: [name], MD   Patient coming from: Home  I have personally briefly reviewed patient's old medical records in [place]  CC: lethargy HPI: 76 year old Caucasian female history of COPD, chronic diastolic heart failure, chronic hypercapnic/hypoxic respiratory failure on home O2 at 2 L a minute, chronic constipation, hypertension, moderate aortic stenosis presents to the ER via EMS.  No family available.  Patient unable give history or review of systems.  Reportedly patient was very lethargic.  Reportedly patient's been falling at home.  End-tidal CO2 between 35 and 45 by EMS.  EMS unable to obtain pulse ox.  Initial ABG showed a pH of 7.27 PCO2 80 PO2 68.  Patient placed on BiPAP.  Repeat blood gas an hour later showed worsening of her hypercapnia with a pH of 7.25, PCO2 88, PO2 103.  Patient given albuterol and steroids and patient continued on BiPAP for another hour and a half.  Repeat ABG showed a pH of 7.31, PCO2 of 73, PO2 85  Patient currently on BiPAP therapy.  Unable to give any history review of systems.  Apparently patient had been agitated that she is got bilateral hand mittens on.  Patient completed 10 mg continuous albuterol neb starting at 3:00.  Another 10 mg nebulizer treatment ordered starting at 5 AM.  [facility] hospitalist contacted for admission.    ED Course: Initial ABG pH 7.27, PCO2 80, PO2 of 68, CT chest negative for pneumonia.  Placed on BiPAP.  Review of Systems:  Review of Systems  Unable to perform ROS: Patient unresponsive    Past Medical History:  Diagnosis Date   Abnormal LFTs 08/02/2011   Acute liver failure 06/19/2013   Acute renal failure ([place]) 06/19/2013   Acute respiratory failure ([place]) 06/19/2013   Altered mental status 08/01/2011   Angina    Anxiety    Anxiety state, unspecified  12/03/2013   Aortic stenosis    mild   Arthritis    "hands" (02/03/2017)   Breast cancer, right breast ([place]) dx'd 2008   CAD (coronary artery disease) of artery bypass graft 11/06/2013   CAD (coronary artery disease), MI R/O 08/01/2011   PCI in 2006 (bare metal stent, unknown artery) - [place]    CAP (community acquired pneumonia) 09/11/2018   CHF,  acute diastolic, BNP 4k on admissio 08/01/2011   Chronic bronchitis ([place])    Chronic respiratory failure ([place]) 02/02/2012   Compression fracture of L1 lumbar vertebra ([place]) 02/02/2012   COPD (chronic obstructive pulmonary disease) ([place])    oxygen-dependent 4LPM [place]   Coronary artery disease 2006   2 stents w/previous MI   Depressed    Diastolic CHF ([place])    Dyslipidemia    Family history of adverse reaction to anesthesia    "daughter had c-section; missed twice w/epidural"   GERD (gastroesophageal reflux disease)    Heart murmur    History of blood transfusion    "w/my colon OR"   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   History of nuclear stress test 08/03/2011   attenuation at apex - no perfusion defects    HTN (hypertension) 11/06/2013   Hyperkalemia, on ACE prior to admission 11/11/2011   Hypertension    Hyponatremia 01/31/2012   Migraine    "qod to q couple months since I was 21" (02/03/2017)   Mild aortic stenosis 08/01/2011  AVA 1.69 cm2 (06/02/2011)    Moderate to severe pulmonary hypertension (HCC)    NSTEMI (non-ST elevated myocardial infarction) ([place]) 2006   NSVT (nonsustained ventricular tachycardia) ([place])    h/o   On home oxygen therapy    "3L just at night; have it available prn duringtheday" (09/11/2018)   Pneumonia    "alot of times" (02/03/2017)   SBO (small bowel obstruction) ([place]) 04/23/2020   PARTIAL    Small bowel obstruction ([place]) 04/22/2020    Past Surgical History:  Procedure Laterality Date   BREAST BIOPSY Right 2008   BREAST LUMPECTOMY Right 2008   malignant   CATARACT EXTRACTION W/ INTRAOCULAR LENS   IMPLANT, BILATERAL Bilateral ~ 2013   [place]; 1971; [place]  11/2007   COLOSTOMY CLOSURE  07/2008   CORONARY ANGIOPLASTY WITH STENT PLACEMENT  2006   "2 stents"   ERCP N/A 09/05/2015   Procedure: ENDOSCOPIC RETROGRADE CHOLANGIOPANCREATOGRAPHY (ERCP);  Surgeon: Ladene Artist, MD;  Location: [facility];  Service: Endoscopy;  Laterality: N/A;   MYRINGOTOMY WITH TUBE PLACEMENT Right 04/11/2022   Procedure: TYMPANOSTOMY TUBE RIGHT EAR, EAR CLEANING AND REMOVAL OF FOREIGN BODIES;  Surgeon: Melissa Montane, MD;  Location: [facility];  Service: ENT;  Laterality: Right;   PARTIAL COLECTOMY  2009   for obstruction: temporary ostomy, later reversed.    RIGHT HEART CATHETERIZATION N/A 09/05/2013   Procedure: RIGHT HEART CATH;  Surgeon: Jolaine Artist, MD;  Location[facility];  Service: Cardiovascular;  Laterality: N/A;   TRANSTHORACIC ECHOCARDIOGRAM  11/12/2011   EF 67-12%, normal systolic function, grade 1 diastolic dysfunction; ventricular septal flattening (D-sign); mild AS; trace-mild MR; LA mildly dilated; RV mod dilated; RA mod dilated; severe pulm HTN; elevated CVP     reports that she has been smoking cigarettes. She has a 26.50 pack-year smoking history. She has never used smokeless tobacco. She reports current alcohol use of about 1.0 standard drink of alcohol per week. She reports that she does not use drugs.  Allergies  Allergen Reactions   Ceftriaxone Anaphylaxis, Swelling and Other (See Comments)    *ROCEPHIN* - "Blew up like a balloon"   Hydroxyzine Shortness Of Breath and Other (See Comments)    Pt states med make her light headed, get sob sxs   Other Shortness Of Breath and Other (See Comments)    Unnamed antibiotic for an ear infection- Extreme dizziness, also   Doxycycline Nausea And Vomiting   Lexapro [Escitalopram] Other (See Comments)    Pt states med make her dizzy    Family History  Problem Relation  Age of [place]   Alzheimer's disease Mother    Schizophrenia Sister    Breast cancer Sister        11s   Heart disease Sister    Diabetes Sister    Hyperlipidemia Brother    Cancer Neg Hx    Stroke Neg Hx    COPD Neg Hx    Depression Neg Hx    Drug abuse Neg Hx    Early death Neg Hx    Hypertension Neg Hx    Kidney disease Neg Hx     Prior to Admission medications   Medication Sig Start Date End Date Taking? Authorizing Provider  ACCRUFER 30 MG CAPS TAKE 1 CAPSULE BY MOUTH EVERY DAY IN THE MORNING AND AT BEDTIME 07/29/22   [name], MD  albuterol (VENTOLIN  HFA) 108 (90 Base) MCG/ACT inhaler Inhale 2 puffs into the lungs every 4 (four) hours as needed for wheezing or shortness of breath. 04/29/22   [name], MD  ALPRAZolam Duanne Moron) 1 MG tablet TAKE 1 TABLET BY MOUTH 3 TIMES DAILY AS NEEDED FOR ANXIETY. Patient taking differently: Take 1 mg by mouth 3 (three) times daily as needed for anxiety. 03/11/22   [name], MD  aspirin EC 325 MG tablet Take 325 mg by mouth in the morning.    [provider]  atorvastatin (LIPITOR) 10 MG tablet TAKE 1 TABLET BY MOUTH EVERY DAY 05/08/22   [name], MD  butalbital-acetaminophen-caffeine (FIORICET) [phone] MG tablet Take 1 tablet by mouth every 4 (four) hours as needed for headache. 06/12/22   [name], MD  Cholecalciferol (VITAMIN D-3) 25 MCG (1000 UT) CAPS Take 1,000 Units by mouth daily.    [provider]  cyanocobalamin (VITAMIN B12) 1000 MCG tablet TAKE 1 TABLET BY MOUTH EVERY DAY 06/27/22   [name], MD  diclofenac Sodium (VOLTAREN) 1 % GEL APPLY 2 GRAMS TO AFFECTED AREA 4 TIMES A DAY 07/07/22   [name], MD  furosemide (LASIX) 40 MG tablet Take 1 tablet (40 mg total) by mouth 2 (two) times daily. 05/12/22   [name], MD  ipratropium (ATROVENT HFA) 17 MCG/ACT inhaler Inhale 2 puffs into the lungs every 4 (four) hours as needed for wheezing. 06/01/22   [name], MD   ipratropium-albuterol (DUONEB) 0.5-2.5 (3) MG/3ML SOLN Inhale 3 mLs into the lungs every 4 (four) hours as needed. Nebulize 3 ml's and inhale into the lungs one to two times a day 07/07/22   [name], MD  KLOR-CON M20 20 MEQ tablet TAKE 1 TABLET BY MOUTH EVERY DAY 05/03/22   [name], MD  lactulose, encephalopathy, (ENULOSE) 10 GM/15ML SOLN Take 45 mLs (30 g total) by mouth 3 (three) times daily as needed (constipation). Patient taking differently: Take 30 g by mouth at bedtime as needed (for constipation). 01/26/22   [name], MD  levocetirizine (XYZAL) 5 MG tablet TAKE 1 TABLET BY MOUTH EVERY DAY IN THE EVENING 07/22/22   [name], MD  LINZESS 72 MCG capsule TAKE 1 CAPSULE BY MOUTH EVERY DAY BEFORE BREAKFAST 05/08/22   [name], MD  LIVALO 2 MG TABS TAKE 1 TABLET BY MOUTH EVERY DAY Patient taking differently: Take 2 mg by mouth daily at 12 noon. 02/17/22   Hilty, Nadean Corwin, MD  meclizine (ANTIVERT) 25 MG tablet Take 12.5-25 mg by mouth 3 (three) times daily as needed for dizziness.    [provider]  mometasone-formoterol (DULERA) 200-5 MCG/ACT AERO Inhale 2 puffs into the lungs 2 (two) times daily. 04/05/22   Hunsucker, Bonna Gains, MD  montelukast (SINGULAIR) 10 MG tablet TAKE 1 TABLET BY MOUTH EVERYDAY AT BEDTIME Patient taking differently: Take 10 mg by mouth at bedtime. 02/25/22   [name], MD  nortriptyline (PAMELOR) 25 MG capsule TAKE 2 CAPSULES BY MOUTH AT BEDTIME 06/01/22   [name], MD  Omega 3 1200 MG CAPS Take 1,200 mg by mouth every morning.     [provider]  OXYGEN Inhale 3 L/min into the lungs See admin instructions. 3 L/min at bedtime and during the day as needed for shortness of breath    [provider]  pantoprazole (PROTONIX) 40 MG tablet TAKE 1 TABLET BY MOUTH EVERY DAY Patient  taking differently: Take 40 mg by mouth daily before breakfast. 04/17/22   [name], MD  polyvinyl alcohol (LIQUIFILM TEARS)  1.4 % ophthalmic solution Place 1 drop into both eyes 3 (three) times daily as needed for dry eyes.    [provider]  senna-docusate (SENOKOT-S) 8.6-50 MG tablet Take 1 tablet by mouth 2 (two) times daily between meals as needed for moderate constipation. 04/28/22   Mercy Riding, MD  vitamin C (ASCORBIC ACID) 500 MG tablet Take 500 mg by mouth daily.    [provider]    Physical Exam: Vitals:   08/08/22 0345 08/08/22 0400 08/08/22 0430 08/08/22 0500  BP: (!) 153/64 (!) 146/60  (!) 162/59  Pulse:  65  63  Resp: '18 18  19  '$ Temp:   97.6 F (36.4 C)   TempSrc:   Axillary   SpO2:  99%  98%    Physical Exam Vitals and nursing note reviewed.  Constitutional:      Comments: Appears chronically ill.  Pale.  Currently on BiPAP.  Left lateral recumbent.  HENT:     Head: Atraumatic.  Cardiovascular:     Rate and Rhythm: Regular rhythm. Tachycardia present.  Pulmonary:     Comments: BiPAP breath sounds in the upper lobes.  Little to no air movement in the lower lobes.  No wheezing. Abdominal:     General: Bowel sounds are normal. There is no distension.  Musculoskeletal:     Right lower leg: No edema.     Left lower leg: No edema.  Skin:    General: Skin is warm and dry.  Neurological:     Comments: Unresponsive to verbal stimuli.      Labs on Admission: I have personally reviewed following labs and imaging studies  CBC: Recent Labs  Lab 08/07/22 1525 08/08/22 0009 08/08/22 0054 08/08/22 0252 08/08/22 0444  WBC 5.7  --   --   --   --   NEUTROABS 4.0  --   --   --   --   HGB 9.4* 10.2* 9.9* 10.2* 10.5*  HCT 33.7* 30.0* 29.0* 30.0* 31.0*  MCV 89.9  --   --   --   --   PLT 289  --   --   --   --    Basic Metabolic Panel: Recent Labs  Lab 08/07/22 1525 08/08/22 0009 08/08/22 0054 08/08/22 0252 08/08/22 0444  NA 137 132* 133* 134* 134*  K 4.8 4.6 4.0 4.1 4.1  CL 94*  --   --   --   --   CO2 34*  --   --   --   --   GLUCOSE 83  --   --   --   --    BUN 10  --   --   --   --   CREATININE 0.71  --   --   --   --   CALCIUM 9.2  --   --   --   --    GFR: CrCl cannot be calculated (Unknown ideal weight.). Liver Function Tests: Recent Labs  Lab 08/07/22 1525  AST 17  ALT 12  ALKPHOS 45  BILITOT 0.1*  PROT 6.3*  ALBUMIN 3.1*   No results for input(s): "LIPASE", "AMYLASE" in the last 168 hours. Recent Labs  Lab 08/08/22 0003  AMMONIA 34   Coagulation Profile: No results for input(s): "INR", "PROTIME" in the last 168 hours. Cardiac Enzymes: Recent Labs  Lab 08/07/22 1525 08/07/22 2006  TROPONINIHS 11 11   BNP (last 3 results) No results for input(s): "PROBNP" in the last 8760 hours. HbA1C: No results for input(s): "HGBA1C" in the last 72 hours. CBG: Recent Labs  Lab 08/07/22 2249  GLUCAP 81   Lipid Profile: No results for input(s): "CHOL", "HDL", "LDLCALC", "TRIG", "CHOLHDL", "LDLDIRECT" in the last 72 hours. Thyroid Function Tests: No results for input(s): "TSH", "T4TOTAL", "FREET4", "T3FREE", "THYROIDAB" in the last 72 hours. Anemia Panel: No results for input(s): "VITAMINB12", "FOLATE", "FERRITIN", "TIBC", "IRON", "RETICCTPCT" in the last 72 hours. Urine analysis:    Component Value Date/Time   COLORURINE YELLOW 08/08/2022 0028   APPEARANCEUR CLEAR 08/08/2022 0028   LABSPEC 1.017 08/08/2022 0028   PHURINE 5.0 08/08/2022 0028   GLUCOSEU NEGATIVE 08/08/2022 0028   GLUCOSEU Negative 03/05/2022 1023   HGBUR NEGATIVE 08/08/2022 0028   BILIRUBINUR NEGATIVE 08/08/2022 0028   KETONESUR NEGATIVE 08/08/2022 0028   PROTEINUR NEGATIVE 08/08/2022 0028   UROBILINOGEN 0.2 mg/dL (A) 03/05/2022 1023   NITRITE NEGATIVE 08/08/2022 0028   LEUKOCYTESUR NEGATIVE 08/08/2022 0028    Radiological Exams on Admission: I have personally reviewed images CT CHEST ABDOMEN PELVIS W CONTRAST  Result Date: 08/08/2022 CLINICAL DATA:  Weight loss, unintended EXAM: CT CHEST, ABDOMEN, AND PELVIS WITH CONTRAST TECHNIQUE:  Multidetector CT imaging of the chest, abdomen and pelvis was performed following the standard protocol during bolus administration of intravenous contrast. RADIATION DOSE REDUCTION: This exam was performed according to the departmental dose-optimization program which includes automated exposure control, adjustment of the mA and/or kV according to patient size and/or use of iterative reconstruction technique. CONTRAST:  7m OMNIPAQUE IOHEXOL 350 MG/ML SOLN COMPARISON:  CT chest 04/27/2022, CT chest 01/01/2015 FINDINGS: CT CHEST FINDINGS Cardiovascular: Normal heart size. Query interatrial connection. No significant pericardial effusion. The thoracic aorta is normal in caliber. Moderate to severe atherosclerotic plaque of the thoracic aorta. Four-vessel coronary artery calcifications. Aortic valve leaflet calcifications. Mediastinum/Nodes: No enlarged mediastinal, hilar, or axillary lymph nodes. Thyroid gland, trachea, and esophagus demonstrate no significant findings. Lungs/Pleura: Mild centrilobular emphysematous changes. Chronic similar-appearing right masslike apical consolidation with air bronchograms. Redemonstration of anterior right upper lobe radiation fibrosis. Stable chronic left lower lobe subpleural 5 x 4 mm pulmonary nodule. No new pulmonary nodule. Similar scattered mild cylindrical bronchiectasis with trace patchy tree-in-bud nodularity. No pulmonary mass. No pleural effusion. No pneumothorax. Musculoskeletal: No chest wall abnormality. No suspicious lytic or blastic osseous lesions. No acute displaced fracture. Old healed left rib fractures. Multilevel degenerative changes of the spine. CT ABDOMEN PELVIS FINDINGS Hepatobiliary: The hepatic parenchyma is diffusely hypodense compared to the splenic parenchyma consistent with fatty infiltration. No focal liver abnormality. Status post cholecystectomy no biliary dilatation. Pancreas: No focal lesion. Normal pancreatic contour. No surrounding  inflammatory changes. No main pancreatic ductal dilatation. Spleen: Normal in size without focal abnormality. Adrenals/Urinary Tract: No adrenal nodule bilaterally. Bilateral kidneys enhance symmetrically. Hypodense lesions likely represent simple renal cysts. Simple renal cysts, in the absence of clinically indicated signs/symptoms, require no independent follow-up. No hydronephrosis. No hydroureter. The urinary bladder is unremarkable. On delayed imaging, there is no urothelial wall thickening and there are no filling defects in the opacified portions of the bilateral collecting systems or ureters. Stomach/Bowel: Partial colectomy. Stomach is within normal limits. No evidence of bowel wall thickening or dilatation. Under distension of the rectum. Stool throughout the colon. Vascular/Lymphatic: No abdominal aorta or iliac aneurysm. Severe atherosclerotic plaque of the aorta and its branches. No abdominal, pelvic,  or inguinal lymphadenopathy. Reproductive: Uterus and bilateral adnexa are unremarkable. Other: No intraperitoneal free fluid. No intraperitoneal free gas. No organized fluid collection. Musculoskeletal: Moderate volume infraumbilical ventral hernia containing mesentery and a long loop of small bowel. No suspicious lytic or blastic osseous lesions. No acute displaced fracture. Chronic stable L1 and L3 compression fractures. Similar-appearing grade 1 anterolisthesis of L4 on L5 and L5 on S1. Multilevel degenerative changes of the spine. Degenerative changes of the right hip. IMPRESSION: 1. Similar scattered mild cylindrical bronchiectasis with trace patchy tree-in-bud nodularity. 2. Moderate volume infraumbilical ventral hernia containing mesentery and a long loop of small bowel. No associated findings suggest bowel obstruction or ischemia. 3. Increased stool burden throughout the colon-correlate clinically for constipation. 4. Aortic Atherosclerosis (ICD10-I70.0) including aortic valve leaflet  calcifications and coronary artery calcification. Correlate with aortic stenosis. 5. Emphysema (ICD10-J43.9). Electronically Signed   By: Iven Finn M.D.   On: 08/08/2022 01:29   CT HEAD WO CONTRAST (5MM)  Result Date: 08/07/2022 CLINICAL DATA:  NECK TRAUMA.  LETHARGY. EXAM: CT HEAD WITHOUT CONTRAST CT CERVICAL SPINE WITHOUT CONTRAST TECHNIQUE: Multidetector CT imaging of the head and cervical spine was performed following the standard protocol without intravenous contrast. Multiplanar CT image reconstructions of the cervical spine were also generated. RADIATION DOSE REDUCTION: This exam was performed according to the departmental dose-optimization program which includes automated exposure control, adjustment of the mA and/or kV according to patient size and/or use of iterative reconstruction technique. COMPARISON:  MRI brain 04/09/2022 FINDINGS: CT HEAD FINDINGS Brain: No evidence of acute infarction, hemorrhage, hydrocephalus, extra-axial collection or mass lesion/mass effect. Vascular: No hyperdense vessel or unexpected calcification. Skull: Normal. Negative for fracture or focal lesion. Sinuses/Orbits: Paranasal sinuses are clear. Partial opacification of the right mastoid air cells. Other: Exam detail is diminished due to motion artifact. Additionally, streak artifact from patient's bilateral earrings and dental amalgam diminishes detail at the level of the posterior fossa. CT CERVICAL SPINE FINDINGS Alignment: Examination is mildly motion limited. Anterolisthesis of C5 on C6 measures 5 mm. No signs of acute post-traumatic mild alignment of the cervical spine. Skull base and vertebrae: No acute fracture. No primary bone lesion or focal pathologic process. Soft tissues and spinal canal: No prevertebral fluid or swelling. No visible canal hematoma. Disc levels: There is marked disc space narrowing and endplate spurring identified C6-7. Moderate disc space narrowing with vacuum disc is noted at C5-6.  Bilateral facet arthropathy noted. Upper chest: Fibrosis and masslike architectural distortion is identified within the right apex which appears unchanged from 04/27/2022. This area was previously characterized as likely representing sequelae of external beam radiation. No acute abnormality noted. Other: None IMPRESSION: 1. No acute intracranial abnormality. 2. No evidence for cervical spine fracture or subluxation. 3. 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 4. Fibrosis and masslike architectural distortion is identified within the right apex which appears unchanged from 04/27/2022. This area was previously characterized as likely representing sequelae of external beam radiation. Electronically Signed   By: Kerby Moors M.D.   On: 08/07/2022 17:02   CT Cervical Spine Wo Contrast  Result Date: 08/07/2022 CLINICAL DATA:  NECK TRAUMA.  LETHARGY. EXAM: CT HEAD WITHOUT CONTRAST CT CERVICAL SPINE WITHOUT CONTRAST TECHNIQUE: Multidetector CT imaging of the head and cervical spine was performed following the standard protocol without intravenous contrast. Multiplanar CT image reconstructions of the cervical spine were also generated. RADIATION DOSE REDUCTION: This exam was performed according to the departmental dose-optimization program which includes automated exposure control, adjustment of  the mA and/or kV according to patient size and/or use of iterative reconstruction technique. COMPARISON:  MRI brain 04/09/2022 FINDINGS: CT HEAD FINDINGS Brain: No evidence of acute infarction, hemorrhage, hydrocephalus, extra-axial collection or mass lesion/mass effect. Vascular: No hyperdense vessel or unexpected calcification. Skull: Normal. Negative for fracture or focal lesion. Sinuses/Orbits: Paranasal sinuses are clear. Partial opacification of the right mastoid air cells. Other: Exam detail is diminished due to motion artifact. Additionally, streak artifact from patient's bilateral earrings and dental  amalgam diminishes detail at the level of the posterior fossa. CT CERVICAL SPINE FINDINGS Alignment: Examination is mildly motion limited. Anterolisthesis of C5 on C6 measures 5 mm. No signs of acute post-traumatic mild alignment of the cervical spine. Skull base and vertebrae: No acute fracture. No primary bone lesion or focal pathologic process. Soft tissues and spinal canal: No prevertebral fluid or swelling. No visible canal hematoma. Disc levels: There is marked disc space narrowing and endplate spurring identified C6-7. Moderate disc space narrowing with vacuum disc is noted at C5-6. Bilateral facet arthropathy noted. Upper chest: Fibrosis and masslike architectural distortion is identified within the right apex which appears unchanged from 04/27/2022. This area was previously characterized as likely representing sequelae of external beam radiation. No acute abnormality noted. Other: None IMPRESSION: 1. No acute intracranial abnormality. 2. No evidence for cervical spine fracture or subluxation. 3. 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 4. Fibrosis and masslike architectural distortion is identified within the right apex which appears unchanged from 04/27/2022. This area was previously characterized as likely representing sequelae of external beam radiation. Electronically Signed   By: Kerby Moors M.D.   On: 08/07/2022 17:02   DG Pelvis 1-2 Views  Result Date: 08/07/2022 CLINICAL DATA:  Fall EXAM: PELVIS - 1-2 VIEW COMPARISON:  None Available. FINDINGS: There is no evidence of pelvic fracture or diastasis. No pelvic bone lesions are seen. Multilevel degenerate disc disease of the lower lumbar spine. IMPRESSION: Negative. Electronically Signed   By: Keane Police D.O.   On: 08/07/2022 16:39   DG Forearm Right  Result Date: 08/07/2022 CLINICAL DATA:  Fall, pain. EXAM: RIGHT FOREARM - 2 VIEW COMPARISON:  None Available. FINDINGS: There is no evidence of fracture or other focal bone  lesions. Soft tissues are unremarkable. IMPRESSION: Negative. Electronically Signed   By: Keane Police D.O.   On: 08/07/2022 16:36   DG Chest 2 View  Result Date: 08/07/2022 CLINICAL DATA:  Failure to thrive, difficult to arouse EXAM: CHEST - 2 VIEW COMPARISON:  Chest x-ray April 09, 2022 FINDINGS: The cardiomediastinal silhouette is unchanged in contour. Left basilar bandlike opacity. No pleural effusion or pneumothorax. Surgical clips in the right axilla and right upper quadrant. No acute osseous abnormality. IMPRESSION: Left basilar bandlike opacity, likely atelectasis. Electronically Signed   By: Beryle Flock M.D.   On: 08/07/2022 16:35    EKG: My personal interpretation of EKG shows: NSR    Assessment/Plan Principal Problem:   Acute on chronic respiratory failure with hypoxia and hypercapnia (HCC) - on home O2 @ 2 L/min Active Problems:   Moderate to severe aortic stenosis - Last echo 04/2022 showed VTI measures 0.81 cm. Aortic valve mean gradient measures 30.7 mmHg. Aortic valve Vmax measures 3.74 m/s.   Chronic diastolic CHF (congestive heart failure) (HCC)   COPD with acute exacerbation (HCC)   HTN (hypertension)   Chronic idiopathic constipation   Hyperlipidemia LDL goal <70    Assessment and Plan: * Acute on chronic respiratory failure with hypoxia  and hypercapnia ([place]) - on home O2 @ 2 L/min Admit to progressive care inpatient. Continue with bipap. PCO2 appears to be improving with treatment of COPD exacerbation with bronchodilators. Hold on abx. CT chest negative for pneumonia. Pt is allergic to Rocephin. Low threshold for intubation given the severity of her disease. Keep NPO for now.  Hyperlipidemia LDL goal <70 Stable.  Chronic idiopathic constipation Constipation noted on CT abd. Given NPO status. Will order enemas.  HTN (hypertension) Stable.  COPD with acute exacerbation ([place]) Continue with IV solumedrol 40 mg q6h, duonebs q4h. Hold on abx.  Chronic  diastolic CHF (congestive heart failure) (HCC) Stable. No pulmonary edema on CT chest  Moderate to severe aortic stenosis - Last echo 04/2022 showed VTI measures 0.81 cm. Aortic valve mean gradient measures 30.7 mmHg. Aortic valve Vmax measures 3.74 m/s. Last echo 04/2022 showed VTI measures 0.81 cm. Aortic valve mean gradient measures 30.7 mmHg. Aortic valve Vmax measures 3.74 m/s.    DVT prophylaxis: SQ Heparin Code Status: Full Code by default. Pt unable to give consent and no family is available. Family Communication: no family available in ER  Disposition Plan: return home vs SNF  Consults called: none  Admission status: Inpatient,  progressive care   Kristopher Oppenheim, DO [facility] 08/08/2022, 5:45 AM

## 2022-08-08 NOTE — Progress Notes (Signed)
Pt placed back on Bipap to rest for the night.

## 2022-08-08 NOTE — Progress Notes (Signed)
Some anxiety  Ativan 0.5 ordered

## 2022-08-08 NOTE — ED Notes (Signed)
Pt placed back on BIPAP

## 2022-08-08 NOTE — Assessment & Plan Note (Addendum)
COPD exacerbation.  Patient with improved ventilation, she has been weaned off to supplemental 02 per [place] Her 02 saturation is 100% on 4 L/min per [place].   Plan to continue oxymetry and supplemental 02 per  to keep 02 saturation 88% or greater.  Continue bronchodilator therapy and inhaled corticosteroids. Azithromycin for 5 days total.  Acute metabolic encephalopathy due to hypercapnic and hypoxemic respiratory failure. Mentation has improved, now patient off non invasive mechanical ventilation. Continue neuro checks per unit protocol, advance diet to clears.

## 2022-08-08 NOTE — Consult Note (Signed)
NAME:  RYLLIE NIELAND, MRN:  [id number], DOB:  1946-05-09, LOS: 0 ADMISSION DATE:  08/07/2022, CONSULTATION DATE:  08/08/22 REFERRING MD:  Tyrell Antonio - [facility], CHIEF COMPLAINT:  SOB   History of Present Illness:  76 yo F PMH breast ca, Emphysema, chronic hypoxic respiratory failure on 3L, diastolic HF, CAD, HLD, AS, presented to ED 11/12 via EMS for AMS, SOB, chest pain. W/ EMS, etCO2 35-45, initial abg 7.21/80/68  CT c/a/p with centrolobular emphysema. Chronic R apical consolidation with associated air bronchograms. Stable LLL subpleural nodule. Scattered mild bronchiectasis    She was placed on BiPAP, started on steroids, and BD. Admitted to [facility]. Repeat ABG with some improvement -- 7.3/73/85/40/9/37.5   She is more alert and has been weaned off BiPAP to [place] is consulted for AECOPD in this setting   In chart review, the patient established care with [facility] 06/2021. She says her most recent inhaler reg is Dulera 1 puff BID and her "rescue inhaler" which she reportedly uses daily.   Pertinent  Medical History  Emphysema Breast cancer Diastolic HF CAD AS HLD Anemia   Significant Hospital Events: Including procedures, antibiotic start and stop dates in addition to other pertinent events   11/12 to ED. Hypercarbic altered. Started on bipap. Admit to [facility]. Pccm consult   Interim History / Subjective:  Off bipap and on 5L  Objective   Blood pressure (!) 162/59, pulse 63, temperature 97.6 F (36.4 C), temperature source Axillary, resp. rate 19, SpO2 98 %.        Intake/Output Summary (Last 24 hours) at 08/08/2022 0805 Last data filed at 08/08/2022 0023 Gross per 24 hour  Intake --  Output 1000 ml  Net -1000 ml   There were no vitals filed for this visit.  Examination: General: chronically ill older adult F  HENT: NCAT pink mm  Lungs: Symmetrical chest expansion. R>L wheeze, good air movement, no accessory muscle use  Cardiovascular: rr s1s2  Abdomen: soft ndnt   Extremities: RUE chronic edema  Neuro: awake alert. Intermittent confusion, delayed speech  GU: defer  Resolved Hospital Problem list     Assessment & Plan:    Acute encephalopathy - likely due to hypercarbia and hypoxia Acute on chronic respiratory failure with hypoxia and hypercarbia Emphysema with acute exacerbation  Mild bronchiectasis LLL subpleural nodule (42m x 480m -not sure what catalyzed current exacerbation. Viral w/u pending, could be that. Could be that home reg needs further adjusting -- nb pt seems somewhat unclear about her home meds, does endorse dulera and albuterol, and states she will not take trelegy again, but does not know about other inhalers. It looks like both he pulmonologist and her PCP have made inhaler changes -with latest ABG 7.3/73/85 -- imagine we are nearing her baseline P [facility] mid 90s -ordered RVP. COVID/flu pending as well -IS, pulm hygiene -will add azithro for 5d  -steroid wise -- will dc solumedrol and order for pred 40 x 5 d -adamantly opposed to deviating from her home inhalers. Have ordered dulera 2puff BID + PRN duoneb -while inpt could use BiPAP PRN if acutely retaining but think we are about at baseline. Eval for chronic hypercarbia/home nppv per op pulm -- in d/w me, pt is adamantly opposed to considering home BiPAP -needs prompt outpt follow up with Dr. HuSilas Floodfter discharge    Best Practice (right click and "Reselect all SmartList Selections" daily)   Per primary   Labs  CBC: Recent Labs  Lab 08/07/22 1525 08/08/22 0009 08/08/22 0054 08/08/22 0252 08/08/22 0444  WBC 5.7  --   --   --   --   NEUTROABS 4.0  --   --   --   --   HGB 9.4* 10.2* 9.9* 10.2* 10.5*  HCT 33.7* 30.0* 29.0* 30.0* 31.0*  MCV 89.9  --   --   --   --   PLT 289  --   --   --   --     Basic Metabolic Panel: Recent Labs  Lab 08/07/22 1525 08/08/22 0009 08/08/22 0054 08/08/22 0252 08/08/22 0444  NA 137  132* 133* 134* 134*  K 4.8 4.6 4.0 4.1 4.1  CL 94*  --   --   --   --   CO2 34*  --   --   --   --   GLUCOSE 83  --   --   --   --   BUN 10  --   --   --   --   CREATININE 0.71  --   --   --   --   CALCIUM 9.2  --   --   --   --    GFR: CrCl cannot be calculated (Unknown ideal weight.). Recent Labs  Lab 08/07/22 1525  WBC 5.7    Liver Function Tests: Recent Labs  Lab 08/07/22 1525  AST 17  ALT 12  ALKPHOS 45  BILITOT 0.1*  PROT 6.3*  ALBUMIN 3.1*   No results for input(s): "LIPASE", "AMYLASE" in the last 168 hours. Recent Labs  Lab 08/08/22 0003  AMMONIA 34    ABG    Component Value Date/Time   PHART 7.313 (L) 08/08/2022 0444   PCO2ART 73.5 (HH) 08/08/2022 0444   PO2ART 85 08/08/2022 0444   HCO3 37.5 (H) 08/08/2022 0444   TCO2 40 (H) 08/08/2022 0444   O2SAT 95 08/08/2022 0444     Coagulation Profile: No results for input(s): "INR", "PROTIME" in the last 168 hours.  Cardiac Enzymes: No results for input(s): "CKTOTAL", "CKMB", "CKMBINDEX", "TROPONINI" in the last 168 hours.  HbA1C: Hgb A1c MFr Bld  Date/Time Value Ref Range Status  01/15/2021 02:31 PM 5.1 4.6 - 6.5 % Final    Comment:    Glycemic Control Guidelines for People with Diabetes:Non Diabetic:  <6%Goal of Therapy: <7%Additional Action Suggested:  >8%   10/16/2015 02:12 PM 5.3 4.6 - 6.5 % Final    Comment:    Glycemic Control Guidelines for People with Diabetes:Non Diabetic:  <6%Goal of Therapy: <7%Additional Action Suggested:  >8%     CBG: Recent Labs  Lab 08/07/22 2249  GLUCAP 81    Review of Systems:   Review of Systems  Constitutional: Negative.   HENT:  Positive for ear pain.   Respiratory:  Positive for shortness of breath. Negative for cough, hemoptysis and sputum production.   Cardiovascular: Negative.   Gastrointestinal: Negative.   Genitourinary: Negative.   Musculoskeletal:  Positive for back pain.  Skin:  Positive for rash.  Neurological: Negative.    Psychiatric/Behavioral: Negative.       Past Medical History:  She,  has a past medical history of Abnormal LFTs (08/02/2011), Acute liver failure (06/19/2013), Acute renal failure ([place]) (06/19/2013), Acute respiratory failure ([place]) (06/19/2013), Altered mental status (08/01/2011), Angina, Anxiety, Anxiety state, unspecified (12/03/2013), Aortic stenosis, Arthritis, Breast cancer, right breast ([place]) (dx'd 2008), CAD (coronary artery disease) of artery bypass graft (11/06/2013), CAD (  coronary artery disease), MI R/O (08/01/2011), CAP (community acquired pneumonia) (09/11/2018), CHF,  acute diastolic, BNP 4k on admissio (08/01/2011), Chronic bronchitis ([place]), Chronic respiratory failure ([place]) (02/02/2012), Compression fracture of L1 lumbar vertebra ([place]) (02/02/2012), COPD (chronic obstructive pulmonary disease) ([place]), Coronary artery disease (2006), Depressed, Diastolic CHF ([place]), Dyslipidemia, Family history of adverse reaction to anesthesia, GERD (gastroesophageal reflux disease), Heart murmur, History of blood transfusion, 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   Surgical History:   Past Surgical History:  Procedure Laterality Date   BREAST BIOPSY Right 2008   BREAST LUMPECTOMY Right 2008   malignant   CATARACT EXTRACTION W/ INTRAOCULAR LENS  IMPLANT, BILATERAL Bilateral ~ 2013   [place]; 1971; [place]  11/2007    COLOSTOMY CLOSURE  07/2008   CORONARY ANGIOPLASTY WITH STENT PLACEMENT  2006   "2 stents"   ERCP N/A 09/05/2015   Procedure: ENDOSCOPIC RETROGRADE CHOLANGIOPANCREATOGRAPHY (ERCP);  Surgeon: Ladene Artist, MD;  Location: [facility];  Service: Endoscopy;  Laterality: N/A;   MYRINGOTOMY WITH TUBE PLACEMENT Right 04/11/2022   Procedure: TYMPANOSTOMY TUBE RIGHT EAR, EAR CLEANING AND REMOVAL OF FOREIGN BODIES;  Surgeon: Melissa Montane, MD;  Location: [place];  Service: ENT;  Laterality: Right;   PARTIAL COLECTOMY  2009   for obstruction: temporary ostomy, later reversed.    RIGHT HEART CATHETERIZATION N/A 09/05/2013   Procedure: RIGHT HEART CATH;  Surgeon: Jolaine Artist, MD;  Location: [facility] CATH LAB;  Service: Cardiovascular;  Laterality: N/A;   TRANSTHORACIC ECHOCARDIOGRAM  11/12/2011   EF 99-37%, normal systolic function, grade 1 diastolic dysfunction; ventricular septal flattening (D-sign); mild AS; trace-mild MR; LA mildly dilated; RV mod dilated; RA mod dilated; severe pulm HTN; elevated CVP     Social History:   reports that she has been smoking cigarettes. She has a 26.50 pack-year smoking history. She has never used smokeless tobacco. She reports current alcohol use of about 1.0 standard drink of alcohol per week. She reports that she does not use drugs.   Family History:  Her family history includes Alzheimer's disease in her mother; Breast cancer in her sister; Diabetes in her sister; Heart disease in her sister; Hyperlipidemia in her brother; Schizophrenia in her sister. There is no history of Cancer, Stroke, COPD, Depression, Drug abuse, Early death, Hypertension, or Kidney disease.   Allergies Allergies  Allergen Reactions   Ceftriaxone Anaphylaxis, Swelling and Other (See Comments)    *ROCEPHIN* - "Blew up like a balloon"   Hydroxyzine Shortness Of Breath and Other (See Comments)    Pt states med make her light headed, get sob sxs   Other Shortness Of Breath and Other (See  Comments)    Unnamed antibiotic for an ear infection- Extreme dizziness, also   Doxycycline Nausea And Vomiting   Lexapro [Escitalopram] Other (See Comments)    Pt states med make her dizzy     Home Medications  Prior to Admission medications   Medication Sig Start Date End Date Taking? Authorizing Provider  ACCRUFER 30 MG CAPS TAKE 1 CAPSULE BY MOUTH EVERY DAY IN THE  MORNING AND AT BEDTIME 07/29/22   [name], MD  albuterol (VENTOLIN HFA) 108 (90 Base) MCG/ACT inhaler Inhale 2 puffs into the lungs every 4 (four) hours as needed for wheezing or shortness of breath. 04/29/22   [name], MD  ALPRAZolam Duanne Moron) 1 MG tablet TAKE 1 TABLET BY MOUTH 3 TIMES DAILY AS NEEDED FOR ANXIETY. Patient taking differently: Take 1 mg by mouth 3 (three) times daily as needed for anxiety. 03/11/22   [name], MD  aspirin EC 325 MG tablet Take 325 mg by mouth in the morning.    [provider]  atorvastatin (LIPITOR) 10 MG tablet TAKE 1 TABLET BY MOUTH EVERY DAY 05/08/22   [name], MD  butalbital-acetaminophen-caffeine (FIORICET) [phone] MG tablet Take 1 tablet by mouth every 4 (four) hours as needed for headache. 06/12/22   [name], MD  Cholecalciferol (VITAMIN D-3) 25 MCG (1000 UT) CAPS Take 1,000 Units by mouth daily.    [provider]  cyanocobalamin (VITAMIN B12) 1000 MCG tablet TAKE 1 TABLET BY MOUTH EVERY DAY 06/27/22   [name], MD  diclofenac Sodium (VOLTAREN) 1 % GEL APPLY 2 GRAMS TO AFFECTED AREA 4 TIMES A DAY 07/07/22   [name], MD  furosemide (LASIX) 40 MG tablet Take 1 tablet (40 mg total) by mouth 2 (two) times daily. 05/12/22   [name], MD  ipratropium (ATROVENT HFA) 17 MCG/ACT inhaler Inhale 2 puffs into the lungs every 4 (four) hours as needed for wheezing. 06/01/22   [name], MD  ipratropium-albuterol (DUONEB) 0.5-2.5 (3) MG/3ML SOLN Inhale 3 mLs into the lungs every 4 (four) hours as needed. Nebulize 3 ml's and inhale  into the lungs one to two times a day 07/07/22   [name], MD  KLOR-CON M20 20 MEQ tablet TAKE 1 TABLET BY MOUTH EVERY DAY 05/03/22   [name], MD  lactulose, encephalopathy, (ENULOSE) 10 GM/15ML SOLN Take 45 mLs (30 g total) by mouth 3 (three) times daily as needed (constipation). Patient taking differently: Take 30 g by mouth at bedtime as needed (for constipation). 01/26/22   [name], MD  levocetirizine (XYZAL) 5 MG tablet TAKE 1 TABLET BY MOUTH EVERY DAY IN THE EVENING 07/22/22   [name], MD  LINZESS 72 MCG capsule TAKE 1 CAPSULE BY MOUTH EVERY DAY BEFORE BREAKFAST 05/08/22   [name], MD  LIVALO 2 MG TABS TAKE 1 TABLET BY MOUTH EVERY DAY Patient taking differently: Take 2 mg by mouth daily at 12 noon. 02/17/22   Hilty, Nadean Corwin, MD  meclizine (ANTIVERT) 25 MG tablet Take 12.5-25 mg by mouth 3 (three) times daily as needed for dizziness.    [provider]  mometasone-formoterol (DULERA) 200-5 MCG/ACT AERO Inhale 2 puffs into the lungs 2 (two) times daily. 04/05/22   Hunsucker, Bonna Gains, MD  montelukast (SINGULAIR) 10 MG tablet TAKE 1 TABLET BY MOUTH EVERYDAY AT BEDTIME Patient taking differently: Take 10 mg by mouth at bedtime. 02/25/22   [name], MD  nortriptyline (PAMELOR) 25 MG capsule TAKE 2 CAPSULES BY MOUTH AT BEDTIME 06/01/22   [name], MD  Omega 3 1200 MG CAPS Take 1,200 mg by mouth every morning.     [provider]  OXYGEN Inhale 3 L/min into the lungs See admin instructions. 3 L/min at bedtime and during the day as needed for shortness of breath    [provider]  pantoprazole (PROTONIX) 40 MG tablet TAKE 1 TABLET BY MOUTH EVERY DAY Patient taking differently: Take 40 mg by mouth daily before breakfast. 04/17/22   [name], MD  polyvinyl alcohol (LIQUIFILM TEARS) 1.4 % ophthalmic solution Place 1 drop into both eyes 3 (three) times daily as needed for dry eyes.    [provider]  senna-docusate  (SENOKOT-S) 8.6-50 MG tablet Take 1 tablet by mouth 2 (two) times daily between meals as needed for moderate constipation. 04/28/22   Mercy Riding, MD  vitamin C (ASCORBIC ACID) 500 MG tablet Take 500 mg by mouth daily.    [provider]     Critical care time: n/a      Eliseo Gum MSN, AGACNP-BC [place] for pager  08/08/2022, 9:04 AM

## 2022-08-08 NOTE — ED Notes (Signed)
ED TO INPATIENT HANDOFF REPORT  ED Nurse Name and Phone #: Andee Poles [phone]  S Name/Age/Gender [name] 76 y.o. female Room/Bed: 032C/032C  Code Status   Code Status: Full Code  Home/SNF/Other Home Patient oriented to: self, place, time, and situation Is this baseline? Yes   Triage Complete: Triage complete  Chief Complaint Acute on chronic respiratory failure with hypoxia and hypercapnia ([place]) [W40.97, J96.22]  Triage Note Patient arrives via [facility] from home. Lethargic per family and hard to arouse. Patient presenting very pale. Hx of regular blood transfusions. Per patient she has been falling a lot at home.   Hx of COPD, CHF, lobe removed in the left, 2 L O2 [place] chronically   VS stable EtCO2:35-45 EMS unable to obtain SpO2 enroute.     Allergies Allergies  Allergen Reactions   Ceftriaxone Anaphylaxis, Swelling and Other (See Comments)    *ROCEPHIN* - "Blew up like a balloon"   Hydroxyzine Shortness Of Breath and Other (See Comments)    Pt states med make her light headed, get sob sxs   Other Shortness Of Breath and Other (See Comments)    Unnamed antibiotic for an ear infection- Extreme dizziness, also   Doxycycline Nausea And Vomiting   Lexapro [Escitalopram] Other (See Comments)    Pt states med make her dizzy    Level of Care/Admitting Diagnosis ED Disposition     ED Disposition  Admit   Condition  --   [place]: [place] [[id number]]  Level of Care: Progressive [102]  Admit to Progressive based on following criteria: RESPIRATORY PROBLEMS hypoxemic/hypercapnic respiratory failure that is responsive to NIPPV (BiPAP) or High Flow Nasal Cannula (6-80 lpm). Frequent assessment/intervention, no > Q2 hrs < Q4 hrs, to maintain oxygenation and pulmonary hygiene.  May admit patient to [facility] or [facility] if equivalent level of care is available:: No  Covid Evaluation: Asymptomatic - no recent exposure (last 10 days)  testing not required  Diagnosis: Acute on chronic respiratory failure with hypoxia and hypercapnia [facility]) [[id number]]  Admitting Physician: Bridgett Larsson, [place]  Attending Physician: Bridgett Larsson, ERIC [[id number]]  Certification:: I certify this patient will need inpatient services for at least 2 midnights  Estimated Length of Stay: 4          B Medical/Surgery History Past Medical History:  Diagnosis Date   Abnormal LFTs 08/02/2011   Acute liver failure 06/19/2013   Acute renal failure ([place]) 06/19/2013   Acute respiratory failure ([place]) 06/19/2013   Altered mental status 08/01/2011   Angina    Anxiety    Anxiety state, unspecified 12/03/2013   Aortic stenosis    mild   Arthritis    "hands" (02/03/2017)   Breast cancer, right breast ([place]) dx'd 2008   CAD (coronary artery disease) of artery bypass graft 11/06/2013   CAD (coronary artery disease), MI R/O 08/01/2011   PCI in 2006 (bare metal stent, unknown artery) - [place]    CAP (community acquired pneumonia) 09/11/2018   CHF,  acute diastolic, BNP 4k on admissio 08/01/2011   Chronic bronchitis ([place])    Chronic respiratory failure ([place]) 02/02/2012   Compression fracture of L1 lumbar vertebra ([place]) 02/02/2012   COPD (chronic obstructive pulmonary disease) ([place])    oxygen-dependent 4LPM [place]   Coronary artery disease 2006   2 stents w/previous MI   Depressed    Diastolic CHF ([place])    Dyslipidemia    Family history of adverse reaction to anesthesia    "  daughter had c-section; missed twice w/epidural"   GERD (gastroesophageal reflux disease)    Heart murmur    History of blood transfusion    "w/my colon OR"   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   History of nuclear stress test 08/03/2011   attenuation at apex - no perfusion defects    HTN (hypertension) 11/06/2013   Hyperkalemia, on ACE prior to admission 11/11/2011   Hypertension    Hyponatremia 01/31/2012   Migraine    "qod to q couple months since I was 21" (02/03/2017)   Mild aortic  stenosis 08/01/2011   AVA 1.69 cm2 (06/02/2011)    Moderate to severe pulmonary hypertension (HCC)    NSTEMI (non-ST elevated myocardial infarction) ([place]) 2006   NSVT (nonsustained ventricular tachycardia) ([place])    h/o   On home oxygen therapy    "3L just at night; have it available prn duringtheday" (09/11/2018)   Pneumonia    "alot of times" (02/03/2017)   SBO (small bowel obstruction) ([place]) 04/23/2020   PARTIAL    Small bowel obstruction ([place]) 04/22/2020   Past Surgical History:  Procedure Laterality Date   BREAST BIOPSY Right 2008   BREAST LUMPECTOMY Right 2008   malignant   CATARACT EXTRACTION W/ INTRAOCULAR LENS  IMPLANT, BILATERAL Bilateral ~ 2013   [place]; 1971; [place]  11/2007   COLOSTOMY CLOSURE  07/2008   CORONARY ANGIOPLASTY WITH STENT PLACEMENT  2006   "2 stents"   ERCP N/A 09/05/2015   Procedure: ENDOSCOPIC RETROGRADE CHOLANGIOPANCREATOGRAPHY (ERCP);  Surgeon: Ladene Artist, MD;  Location: [facility];  Service: Endoscopy;  Laterality: N/A;   MYRINGOTOMY WITH TUBE PLACEMENT Right 04/11/2022   Procedure: TYMPANOSTOMY TUBE RIGHT EAR, EAR CLEANING AND REMOVAL OF FOREIGN BODIES;  Surgeon: Melissa Montane, MD;  Location: [facility];  Service: ENT;  Laterality: Right;   PARTIAL COLECTOMY  2009   for obstruction: temporary ostomy, later reversed.    RIGHT HEART CATHETERIZATION N/A 09/05/2013   Procedure: RIGHT HEART CATH;  Surgeon: Jolaine Artist, MD;  Location: [facility] CATH LAB;  Service: Cardiovascular;  Laterality: N/A;   TRANSTHORACIC ECHOCARDIOGRAM  11/12/2011   EF 69-67%, normal systolic function, grade 1 diastolic dysfunction; ventricular septal flattening (D-sign); mild AS; trace-mild MR; LA mildly dilated; RV mod dilated; RA mod dilated; severe pulm HTN; elevated CVP     A IV Location/Drains/Wounds Patient Lines/Drains/Airways Status     Active Line/Drains/Airways     Name Placement date Placement  time Site Days   Peripheral IV 08/08/22 20 G Anterior;Left Forearm 08/08/22  0010  Forearm  less than 1   Myringotomy Tube 04/11/22  0804  Right Ear  119   Incision (Closed) 04/11/22 Ear Right 04/11/22  0816  -- 119            Intake/Output Last 24 hours  Intake/Output Summary (Last 24 hours) at 08/08/2022 1438 Last data filed at 08/08/2022 1035 Gross per 24 hour  Intake 250.07 ml  Output 1000 ml  Net -749.93 ml    Labs/Imaging Results for orders placed or performed during the hospital encounter of 08/07/22 (from the past 48 hour(s))  CBC with Differential     Status: Abnormal   Collection Time: 08/07/22  3:25 PM  Result Value Ref Range   WBC 5.7 4.0 - 10.5 K/uL   RBC 3.75 (L) 3.87 - 5.11 MIL/uL   Hemoglobin 9.4 (L) 12.0 - 15.0  g/dL   HCT 33.7 (L) 36.0 - 46.0 %   MCV 89.9 80.0 - 100.0 fL   MCH 25.1 (L) 26.0 - 34.0 pg   MCHC 27.9 (L) 30.0 - 36.0 g/dL   RDW 18.8 (H) 11.5 - 15.5 %   Platelets 289 150 - 400 K/uL   nRBC 0.0 0.0 - 0.2 %   Neutrophils Relative % 70 %   Neutro Abs 4.0 1.7 - 7.7 K/uL   Lymphocytes Relative 20 %   Lymphs Abs 1.1 0.7 - 4.0 K/uL   Monocytes Relative 8 %   Monocytes Absolute 0.5 0.1 - 1.0 K/uL   Eosinophils Relative 1 %   Eosinophils Absolute 0.1 0.0 - 0.5 K/uL   Basophils Relative 1 %   Basophils Absolute 0.1 0.0 - 0.1 K/uL   Immature Granulocytes 0 %   Abs Immature Granulocytes 0.01 0.00 - 0.07 K/uL    Comment: Performed at [facility] [address] [place], [place] [address]  Comprehensive metabolic panel     Status: Abnormal   Collection Time: 08/07/22  3:25 PM  Result Value Ref Range   Sodium 137 135 - 145 mmol/L   Potassium 4.8 3.5 - 5.1 mmol/L   Chloride 94 (L) 98 - 111 mmol/L   CO2 34 (H) 22 - 32 mmol/L   Glucose, Bld 83 70 - 99 mg/dL    Comment: Glucose reference range applies only to samples taken after fasting for at least 8 hours.   BUN 10 8 - 23 mg/dL   Creatinine, Ser 0.71 0.44 - 1.00 mg/dL   Calcium 9.2 8.9 -  10.3 mg/dL   Total Protein 6.3 (L) 6.5 - 8.1 g/dL   Albumin 3.1 (L) 3.5 - 5.0 g/dL   AST 17 15 - 41 U/L   ALT 12 0 - 44 U/L   Alkaline Phosphatase 45 38 - 126 U/L   Total Bilirubin 0.1 (L) 0.3 - 1.2 mg/dL   GFR, Estimated >60 >60 mL/min    Comment: (NOTE) Calculated using the CKD-EPI Creatinine Equation (2021)    Anion gap 9 5 - 15    Comment: Performed at [facility], [place] [address] [place], [place] [address]  Type and screen [place]     Status: None   Collection Time: 08/07/22  3:25 PM  Result Value Ref Range   ABO/RH(D) A NEG    Antibody Screen NEG    Sample Expiration      08/10/2022,2359 Performed at [facility], [place] [address], [place], [place] [address]   Troponin I (High Sensitivity)     Status: None   Collection Time: 08/07/22  3:25 PM  Result Value Ref Range   Troponin I (High Sensitivity) 11 <18 ng/L    Comment: (NOTE) Elevated high sensitivity troponin I (hsTnI) values and significant  changes across serial measurements may suggest ACS but many other  chronic and acute conditions are known to elevate hsTnI results.  Refer to the "Links" section for chest pain algorithms and additional  guidance. Performed at [facility], [place] [address] [place],  [address]   Brain natriuretic peptide     Status: Abnormal   Collection Time: 08/07/22  3:25 PM  Result Value Ref Range   B Natriuretic Peptide 112.5 (H) 0.0 - 100.0 pg/mL    Comment: Performed at [facility] [address] [place], [place] [address]  Troponin I (High Sensitivity)  Status: None   Collection Time: 08/07/22  8:06 PM  Result Value Ref Range   Troponin I (High Sensitivity) 11 <18 ng/L    Comment: (NOTE) Elevated high sensitivity troponin I (hsTnI) values and significant  changes across serial measurements may suggest ACS but many other  chronic and acute conditions are known to elevate hsTnI results.  Refer to the "Links" section for chest  pain algorithms and additional  guidance. Performed at [facility], [place] [address], [place], [place] [address]   CBG monitoring, ED     Status: None   Collection Time: 08/07/22 10:49 PM  Result Value Ref Range   Glucose-Capillary 81 70 - 99 mg/dL    Comment: Glucose reference range applies only to samples taken after fasting for at least 8 hours.  Ammonia     Status: None   Collection Time: 08/08/22 12:03 AM  Result Value Ref Range   Ammonia 34 9 - 35 umol/L    Comment: Performed at [facility], [place] [address], [place], [place] [address]  I-Stat venous blood gas, [facility] ED only)     Status: Abnormal   Collection Time: 08/08/22 12:09 AM  Result Value Ref Range   pH, Ven 7.593 (H) 7.25 - 7.43   pCO2, Ven 38.0 (L) 44 - 60 mmHg   pO2, Ven 122 (H) 32 - 45 mmHg   Bicarbonate 36.7 (H) 20.0 - 28.0 mmol/L   TCO2 38 (H) 22 - 32 mmol/L   O2 Saturation 99 %   Acid-Base Excess 14.0 (H) 0.0 - 2.0 mmol/L   Sodium 132 (L) 135 - 145 mmol/L   Potassium 4.6 3.5 - 5.1 mmol/L   Calcium, Ion 0.95 (L) 1.15 - 1.40 mmol/L   HCT 30.0 (L) 36.0 - 46.0 %   Hemoglobin 10.2 (L) 12.0 - 15.0 g/dL   Sample type VENOUS   Urinalysis, Routine w reflex microscopic Urine, In & Out Cath     Status: None   Collection Time: 08/08/22 12:28 AM  Result Value Ref Range   Color, Urine YELLOW YELLOW   APPearance CLEAR CLEAR   Specific Gravity, Urine 1.017 1.005 - 1.030   pH 5.0 5.0 - 8.0   Glucose, UA NEGATIVE NEGATIVE mg/dL   Hgb urine dipstick NEGATIVE NEGATIVE   Bilirubin Urine NEGATIVE NEGATIVE   Ketones, ur NEGATIVE NEGATIVE mg/dL   Protein, ur NEGATIVE NEGATIVE mg/dL   Nitrite NEGATIVE NEGATIVE   Leukocytes,Ua NEGATIVE NEGATIVE    Comment: Performed at [facility], [address], [place], [place] [address]  I-Stat arterial blood gas, ED [facility] ED only)     Status: Abnormal   Collection Time: 08/08/22 12:54 AM  Result Value Ref Range   pH, Arterial 7.271 (L) 7.35 - 7.45   pCO2 arterial 80.7  (HH) 32 - 48 mmHg   pO2, Arterial 68 (L) 83 - 108 mmHg   Bicarbonate 37.5 (H) 20.0 - 28.0 mmol/L   TCO2 40 (H) 22 - 32 mmol/L   O2 Saturation 90 %   Acid-Base Excess 8.0 (H) 0.0 - 2.0 mmol/L   Sodium 133 (L) 135 - 145 mmol/L   Potassium 4.0 3.5 - 5.1 mmol/L   Calcium, Ion 1.25 1.15 - 1.40 mmol/L   HCT 29.0 (L) 36.0 - 46.0 %   Hemoglobin 9.9 (L) 12.0 - 15.0 g/dL   Patient temperature 97.3 F    Collection site RADIAL, ALLEN'S TEST ACCEPTABLE    Drawn by HIDE    Sample  type ARTERIAL    Comment NOTIFIED PHYSICIAN   I-Stat arterial blood gas, ED [facility] ED only)     Status: Abnormal   Collection Time: 08/08/22  2:52 AM  Result Value Ref Range   pH, Arterial 7.255 (L) 7.35 - 7.45   pCO2 arterial 88.2 (HH) 32 - 48 mmHg   pO2, Arterial 103 83 - 108 mmHg   Bicarbonate 39.4 (H) 20.0 - 28.0 mmol/L   TCO2 42 (H) 22 - 32 mmol/L   O2 Saturation 97 %   Acid-Base Excess 10.0 (H) 0.0 - 2.0 mmol/L   Sodium 134 (L) 135 - 145 mmol/L   Potassium 4.1 3.5 - 5.1 mmol/L   Calcium, Ion 1.26 1.15 - 1.40 mmol/L   HCT 30.0 (L) 36.0 - 46.0 %   Hemoglobin 10.2 (L) 12.0 - 15.0 g/dL   Patient temperature 97.5 F    Collection site RADIAL, ALLEN'S TEST ACCEPTABLE    Drawn by HIDE    Sample type ARTERIAL    Comment NOTIFIED PHYSICIAN   I-Stat arterial blood gas, ED     Status: Abnormal   Collection Time: 08/08/22  4:44 AM  Result Value Ref Range   pH, Arterial 7.313 (L) 7.35 - 7.45   pCO2 arterial 73.5 (HH) 32 - 48 mmHg   pO2, Arterial 85 83 - 108 mmHg   Bicarbonate 37.5 (H) 20.0 - 28.0 mmol/L   TCO2 40 (H) 22 - 32 mmol/L   O2 Saturation 95 %   Acid-Base Excess 9.0 (H) 0.0 - 2.0 mmol/L   Sodium 134 (L) 135 - 145 mmol/L   Potassium 4.1 3.5 - 5.1 mmol/L   Calcium, Ion 1.25 1.15 - 1.40 mmol/L   HCT 31.0 (L) 36.0 - 46.0 %   Hemoglobin 10.5 (L) 12.0 - 15.0 g/dL   Patient temperature 97.6 F    Collection site RADIAL, ALLEN'S TEST ACCEPTABLE    Drawn by HIDE    Sample type ARTERIAL    Comment NOTIFIED  PHYSICIAN   Resp Panel by RT-PCR (Flu A&B, Covid) Anterior Nasal Swab     Status: None   Collection Time: 08/08/22  6:16 AM   Specimen: Anterior Nasal Swab  Result Value Ref Range   SARS Coronavirus 2 by RT PCR NEGATIVE NEGATIVE    Comment: (NOTE) SARS-CoV-2 target nucleic acids are NOT DETECTED.  The SARS-CoV-2 RNA is generally detectable in upper respiratory specimens during the acute phase of infection. The lowest concentration of SARS-CoV-2 viral copies this assay can detect is 138 copies/mL. A negative result does not preclude SARS-Cov-2 infection and should not be used as the sole basis for treatment or other patient management decisions. A negative result may occur with  improper specimen collection/handling, submission of specimen other than nasopharyngeal swab, presence of viral mutation(s) within the areas targeted by this assay, and inadequate number of viral copies(<138 copies/mL). A negative result must be combined with clinical observations, patient history, and epidemiological information. The expected result is Negative.  Fact Sheet for Patients:  EntrepreneurPulse.com.au  Fact Sheet for Healthcare Providers:  IncredibleEmployment.be  This test is no t yet approved or cleared by the [place] FDA and  has been authorized for detection and/or diagnosis of SARS-CoV-2 by FDA under an Emergency Use Authorization (EUA). This EUA will remain  in effect (meaning this test can be used) for the duration of the COVID-19 declaration under Section 564(b)(1) of the Act, 21 U.S.C.section 360bbb-3(b)(1), unless the authorization is terminated  or revoked sooner.  Influenza A by PCR NEGATIVE NEGATIVE   Influenza B by PCR NEGATIVE NEGATIVE    Comment: (NOTE) The Xpert Xpress SARS-CoV-2/FLU/RSV plus assay is intended as an aid in the diagnosis of influenza from Nasopharyngeal swab specimens and should not be used as a sole basis for  treatment. Nasal washings and aspirates are unacceptable for Xpert Xpress SARS-CoV-2/FLU/RSV testing.  Fact Sheet for Patients: EntrepreneurPulse.com.au  Fact Sheet for Healthcare Providers: IncredibleEmployment.be  This test is not yet approved or cleared by the [place] FDA and has been authorized for detection and/or diagnosis of SARS-CoV-2 by FDA under an Emergency Use Authorization (EUA). This EUA will remain in effect (meaning this test can be used) for the duration of the COVID-19 declaration under Section 564(b)(1) of the Act, 21 U.S.C. section 360bbb-3(b)(1), unless the authorization is terminated or revoked.  Performed at [facility], [place] [address], [place], [place] [address]   Basic metabolic panel     Status: Abnormal   Collection Time: 08/08/22  8:05 AM  Result Value Ref Range   Sodium 133 (L) 135 - 145 mmol/L   Potassium 5.2 (H) 3.5 - 5.1 mmol/L    Comment: HEMOLYSIS AT THIS LEVEL MAY AFFECT RESULT   Chloride 93 (L) 98 - 111 mmol/L   CO2 32 22 - 32 mmol/L   Glucose, Bld 97 70 - 99 mg/dL    Comment: Glucose reference range applies only to samples taken after fasting for at least 8 hours.   BUN 9 8 - 23 mg/dL   Creatinine, Ser 0.74 0.44 - 1.00 mg/dL   Calcium 8.9 8.9 - 10.3 mg/dL   GFR, Estimated >60 >60 mL/min    Comment: (NOTE) Calculated using the CKD-EPI Creatinine Equation (2021)    Anion gap 8 5 - 15    Comment: Performed at [facility] [address] [place], [place] [address]  Respiratory (~20 pathogens) panel by PCR     Status: None   Collection Time: 08/08/22  8:15 AM   Specimen: Nasopharyngeal Swab; Respiratory  Result Value Ref Range   Adenovirus NOT DETECTED NOT DETECTED   Coronavirus 229E NOT DETECTED NOT DETECTED    Comment: (NOTE) The Coronavirus on the Respiratory Panel, DOES NOT test for the novel  Coronavirus (2019 nCoV)    Coronavirus HKU1 NOT DETECTED NOT DETECTED   Coronavirus  NL63 NOT DETECTED NOT DETECTED   Coronavirus OC43 NOT DETECTED NOT DETECTED   Metapneumovirus NOT DETECTED NOT DETECTED   Rhinovirus / Enterovirus NOT DETECTED NOT DETECTED   Influenza A NOT DETECTED NOT DETECTED   Influenza B NOT DETECTED NOT DETECTED   Parainfluenza Virus 1 NOT DETECTED NOT DETECTED   Parainfluenza Virus 2 NOT DETECTED NOT DETECTED   Parainfluenza Virus 3 NOT DETECTED NOT DETECTED   Parainfluenza Virus 4 NOT DETECTED NOT DETECTED   Respiratory Syncytial Virus NOT DETECTED NOT DETECTED   Bordetella pertussis NOT DETECTED NOT DETECTED   Bordetella Parapertussis NOT DETECTED NOT DETECTED   Chlamydophila pneumoniae NOT DETECTED NOT DETECTED   Mycoplasma pneumoniae NOT DETECTED NOT DETECTED    Comment: Performed at [facility], [place] [address], [place], [place] [address]  I-Stat arterial blood gas, ED [facility] ED only)     Status: Abnormal   Collection Time: 08/08/22  8:57 AM  Result Value Ref Range   pH, Arterial 7.293 (L) 7.35 - 7.45   pCO2 arterial 79.2 (HH) 32 - 48 mmHg   pO2, Arterial 89 83 - 108  mmHg   Bicarbonate 38.4 (H) 20.0 - 28.0 mmol/L   TCO2 41 (H) 22 - 32 mmol/L   O2 Saturation 95 %   Acid-Base Excess 9.0 (H) 0.0 - 2.0 mmol/L   Sodium 134 (L) 135 - 145 mmol/L   Potassium 4.1 3.5 - 5.1 mmol/L   Calcium, Ion 1.24 1.15 - 1.40 mmol/L   HCT 35.0 (L) 36.0 - 46.0 %   Hemoglobin 11.9 (L) 12.0 - 15.0 g/dL   Sample type ARTERIAL    Comment NOTIFIED PHYSICIAN    CT CHEST ABDOMEN PELVIS W CONTRAST  Result Date: 08/08/2022 CLINICAL DATA:  Weight loss, unintended EXAM: CT CHEST, ABDOMEN, AND PELVIS WITH CONTRAST TECHNIQUE: Multidetector CT imaging of the chest, abdomen and pelvis was performed following the standard protocol during bolus administration of intravenous contrast. RADIATION DOSE REDUCTION: This exam was performed according to the departmental dose-optimization program which includes automated exposure control, adjustment of the mA and/or kV according  to patient size and/or use of iterative reconstruction technique. CONTRAST:  18m OMNIPAQUE IOHEXOL 350 MG/ML SOLN COMPARISON:  CT chest 04/27/2022, CT chest 01/01/2015 FINDINGS: CT CHEST FINDINGS Cardiovascular: Normal heart size. Query interatrial connection. No significant pericardial effusion. The thoracic aorta is normal in caliber. Moderate to severe atherosclerotic plaque of the thoracic aorta. Four-vessel coronary artery calcifications. Aortic valve leaflet calcifications. Mediastinum/Nodes: No enlarged mediastinal, hilar, or axillary lymph nodes. Thyroid gland, trachea, and esophagus demonstrate no significant findings. Lungs/Pleura: Mild centrilobular emphysematous changes. Chronic similar-appearing right masslike apical consolidation with air bronchograms. Redemonstration of anterior right upper lobe radiation fibrosis. Stable chronic left lower lobe subpleural 5 x 4 mm pulmonary nodule. No new pulmonary nodule. Similar scattered mild cylindrical bronchiectasis with trace patchy tree-in-bud nodularity. No pulmonary mass. No pleural effusion. No pneumothorax. Musculoskeletal: No chest wall abnormality. No suspicious lytic or blastic osseous lesions. No acute displaced fracture. Old healed left rib fractures. Multilevel degenerative changes of the spine. CT ABDOMEN PELVIS FINDINGS Hepatobiliary: The hepatic parenchyma is diffusely hypodense compared to the splenic parenchyma consistent with fatty infiltration. No focal liver abnormality. Status post cholecystectomy no biliary dilatation. Pancreas: No focal lesion. Normal pancreatic contour. No surrounding inflammatory changes. No main pancreatic ductal dilatation. Spleen: Normal in size without focal abnormality. Adrenals/Urinary Tract: No adrenal nodule bilaterally. Bilateral kidneys enhance symmetrically. Hypodense lesions likely represent simple renal cysts. Simple renal cysts, in the absence of clinically indicated signs/symptoms, require no independent  follow-up. No hydronephrosis. No hydroureter. The urinary bladder is unremarkable. On delayed imaging, there is no urothelial wall thickening and there are no filling defects in the opacified portions of the bilateral collecting systems or ureters. Stomach/Bowel: Partial colectomy. Stomach is within normal limits. No evidence of bowel wall thickening or dilatation. Under distension of the rectum. Stool throughout the colon. Vascular/Lymphatic: No abdominal aorta or iliac aneurysm. Severe atherosclerotic plaque of the aorta and its branches. No abdominal, pelvic, or inguinal lymphadenopathy. Reproductive: Uterus and bilateral adnexa are unremarkable. Other: No intraperitoneal free fluid. No intraperitoneal free gas. No organized fluid collection. Musculoskeletal: Moderate volume infraumbilical ventral hernia containing mesentery and a long loop of small bowel. No suspicious lytic or blastic osseous lesions. No acute displaced fracture. Chronic stable L1 and L3 compression fractures. Similar-appearing grade 1 anterolisthesis of L4 on L5 and L5 on S1. Multilevel degenerative changes of the spine. Degenerative changes of the right hip. IMPRESSION: 1. Similar scattered mild cylindrical bronchiectasis with trace patchy tree-in-bud nodularity. 2. Moderate volume infraumbilical ventral hernia containing mesentery and a long loop of small  bowel. No associated findings suggest bowel obstruction or ischemia. 3. Increased stool burden throughout the colon-correlate clinically for constipation. 4. Aortic Atherosclerosis (ICD10-I70.0) including aortic valve leaflet calcifications and coronary artery calcification. Correlate with aortic stenosis. 5. Emphysema (ICD10-J43.9). Electronically Signed   By: Iven Finn M.D.   On: 08/08/2022 01:29   CT HEAD WO CONTRAST (5MM)  Result Date: 08/07/2022 CLINICAL DATA:  NECK TRAUMA.  LETHARGY. EXAM: CT HEAD WITHOUT CONTRAST CT CERVICAL SPINE WITHOUT CONTRAST TECHNIQUE: Multidetector  CT imaging of the head and cervical spine was performed following the standard protocol without intravenous contrast. Multiplanar CT image reconstructions of the cervical spine were also generated. RADIATION DOSE REDUCTION: This exam was performed according to the departmental dose-optimization program which includes automated exposure control, adjustment of the mA and/or kV according to patient size and/or use of iterative reconstruction technique. COMPARISON:  MRI brain 04/09/2022 FINDINGS: CT HEAD FINDINGS Brain: No evidence of acute infarction, hemorrhage, hydrocephalus, extra-axial collection or mass lesion/mass effect. Vascular: No hyperdense vessel or unexpected calcification. Skull: Normal. Negative for fracture or focal lesion. Sinuses/Orbits: Paranasal sinuses are clear. Partial opacification of the right mastoid air cells. Other: Exam detail is diminished due to motion artifact. Additionally, streak artifact from patient's bilateral earrings and dental amalgam diminishes detail at the level of the posterior fossa. CT CERVICAL SPINE FINDINGS Alignment: Examination is mildly motion limited. Anterolisthesis of C5 on C6 measures 5 mm. No signs of acute post-traumatic mild alignment of the cervical spine. Skull base and vertebrae: No acute fracture. No primary bone lesion or focal pathologic process. Soft tissues and spinal canal: No prevertebral fluid or swelling. No visible canal hematoma. Disc levels: There is marked disc space narrowing and endplate spurring identified C6-7. Moderate disc space narrowing with vacuum disc is noted at C5-6. Bilateral facet arthropathy noted. Upper chest: Fibrosis and masslike architectural distortion is identified within the right apex which appears unchanged from 04/27/2022. This area was previously characterized as likely representing sequelae of external beam radiation. No acute abnormality noted. Other: None IMPRESSION: 1. No acute intracranial abnormality. 2. No  evidence for cervical spine fracture or subluxation. 3. 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 4. Fibrosis and masslike architectural distortion is identified within the right apex which appears unchanged from 04/27/2022. This area was previously characterized as likely representing sequelae of external beam radiation. Electronically Signed   By: Kerby Moors M.D.   On: 08/07/2022 17:02   CT Cervical Spine Wo Contrast  Result Date: 08/07/2022 CLINICAL DATA:  NECK TRAUMA.  LETHARGY. EXAM: CT HEAD WITHOUT CONTRAST CT CERVICAL SPINE WITHOUT CONTRAST TECHNIQUE: Multidetector CT imaging of the head and cervical spine was performed following the standard protocol without intravenous contrast. Multiplanar CT image reconstructions of the cervical spine were also generated. RADIATION DOSE REDUCTION: This exam was performed according to the departmental dose-optimization program which includes automated exposure control, adjustment of the mA and/or kV according to patient size and/or use of iterative reconstruction technique. COMPARISON:  MRI brain 04/09/2022 FINDINGS: CT HEAD FINDINGS Brain: No evidence of acute infarction, hemorrhage, hydrocephalus, extra-axial collection or mass lesion/mass effect. Vascular: No hyperdense vessel or unexpected calcification. Skull: Normal. Negative for fracture or focal lesion. Sinuses/Orbits: Paranasal sinuses are clear. Partial opacification of the right mastoid air cells. Other: Exam detail is diminished due to motion artifact. Additionally, streak artifact from patient's bilateral earrings and dental amalgam diminishes detail at the level of the posterior fossa. CT CERVICAL SPINE FINDINGS Alignment: Examination is mildly motion limited. Anterolisthesis of C5  on C6 measures 5 mm. No signs of acute post-traumatic mild alignment of the cervical spine. Skull base and vertebrae: No acute fracture. No primary bone lesion or focal pathologic process. Soft tissues and  spinal canal: No prevertebral fluid or swelling. No visible canal hematoma. Disc levels: There is marked disc space narrowing and endplate spurring identified C6-7. Moderate disc space narrowing with vacuum disc is noted at C5-6. Bilateral facet arthropathy noted. Upper chest: Fibrosis and masslike architectural distortion is identified within the right apex which appears unchanged from 04/27/2022. This area was previously characterized as likely representing sequelae of external beam radiation. No acute abnormality noted. Other: None IMPRESSION: 1. No acute intracranial abnormality. 2. No evidence for cervical spine fracture or subluxation. 3. 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 4. Fibrosis and masslike architectural distortion is identified within the right apex which appears unchanged from 04/27/2022. This area was previously characterized as likely representing sequelae of external beam radiation. Electronically Signed   By: Kerby Moors M.D.   On: 08/07/2022 17:02   DG Pelvis 1-2 Views  Result Date: 08/07/2022 CLINICAL DATA:  Fall EXAM: PELVIS - 1-2 VIEW COMPARISON:  None Available. FINDINGS: There is no evidence of pelvic fracture or diastasis. No pelvic bone lesions are seen. Multilevel degenerate disc disease of the lower lumbar spine. IMPRESSION: Negative. Electronically Signed   By: Keane Police D.O.   On: 08/07/2022 16:39   DG Forearm Right  Result Date: 08/07/2022 CLINICAL DATA:  Fall, pain. EXAM: RIGHT FOREARM - 2 VIEW COMPARISON:  None Available. FINDINGS: There is no evidence of fracture or other focal bone lesions. Soft tissues are unremarkable. IMPRESSION: Negative. Electronically Signed   By: Keane Police D.O.   On: 08/07/2022 16:36   DG Chest 2 View  Result Date: 08/07/2022 CLINICAL DATA:  Failure to thrive, difficult to arouse EXAM: CHEST - 2 VIEW COMPARISON:  Chest x-ray April 09, 2022 FINDINGS: The cardiomediastinal silhouette is unchanged in contour. Left  basilar bandlike opacity. No pleural effusion or pneumothorax. Surgical clips in the right axilla and right upper quadrant. No acute osseous abnormality. IMPRESSION: Left basilar bandlike opacity, likely atelectasis. Electronically Signed   By: Beryle Flock M.D.   On: 08/07/2022 16:35    Pending Labs Unresulted Labs (From admission, onward)     Start     Ordered   08/08/22 0435  Blood gas, arterial  Once,   R        08/08/22 0434            Vitals/Pain Today's Vitals   08/08/22 1012 08/08/22 1200 08/08/22 1300 08/08/22 1438  BP:  (!) 141/59 (!) 122/56   Pulse:  85 76   Resp:  20 (!) 21   Temp:    98.2 F (36.8 C)  TempSrc:    Axillary  SpO2:  99% 99%   PainSc: 0-No pain       Isolation Precautions Droplet precaution  Medications Medications  albuterol (PROVENTIL) (2.5 MG/3ML) 0.083% nebulizer solution 5 mg (5 mg Nebulization Not Given 08/08/22 0317)  albuterol (PROVENTIL) (2.5 MG/3ML) 0.083% nebulizer solution (10 mg/hr Nebulization New Bag/Given 08/08/22 0540)  heparin injection 5,000 Units (5,000 Units Subcutaneous Given 08/08/22 0622)  furosemide (LASIX) injection 40 mg (40 mg Intravenous Given 08/08/22 0622)  pantoprazole (PROTONIX) injection 40 mg (40 mg Intravenous Given 08/08/22 0929)  acetaminophen (TYLENOL) suppository 650 mg (has no administration in time range)  senna-docusate (Senokot-S) tablet 1 tablet (1 tablet Oral Given 08/08/22 0929)  linaclotide (  LINZESS) capsule 145 mcg (145 mcg Oral Given 08/08/22 0827)  azithromycin (ZITHROMAX) 500 mg in sodium chloride 0.9 % 250 mL IVPB (0 mg Intravenous Stopped 08/08/22 1035)  mometasone-formoterol (DULERA) 200-5 MCG/ACT inhaler 2 puff (2 puffs Inhalation Not Given 08/08/22 1242)  predniSONE (DELTASONE) tablet 40 mg (has no administration in time range)  ipratropium-albuterol (DUONEB) 0.5-2.5 (3) MG/3ML nebulizer solution 3 mL (has no administration in time range)  iohexol (OMNIPAQUE) 350 MG/ML injection 75 mL  (75 mLs Intravenous Contrast Given 08/08/22 0032)  ipratropium (ATROVENT) nebulizer solution 1.5 mg (1.5 mg Nebulization Given 08/08/22 0315)  methylPREDNISolone sodium succinate (SOLU-MEDROL) 125 mg/2 mL injection 125 mg (125 mg Intravenous Given 08/08/22 0352)  LORazepam (ATIVAN) injection 0.5 mg (0.5 mg Intravenous Given 08/08/22 1408)    Mobility non-ambulatory-- due to being on BIPAP High fall risk   Focused Assessments    R Recommendations: See Admitting Provider Note  Report given to:   Additional Notes: Patient is alert and oriented, with periods of confusion. Trialed off of BIPAP this AM with worsening, ABG. Pt placed back on BIPAP. Started to get agitated, one time dose of Ativan given. Pt calm and cooperative at this time.

## 2022-08-08 NOTE — Telephone Encounter (Signed)
Patient being seen in the hospital for respiratory failure  Needs follow-up with Dr. Silas Flood Saw him about a year ago  Please schedule for 1 to 2 weeks

## 2022-08-08 NOTE — Subjective & Objective (Signed)
CC: lethargy HPI: 76 year old Caucasian female history of COPD, chronic diastolic heart failure, chronic hypercapnic/hypoxic respiratory failure on home O2 at 2 L a minute, chronic constipation, hypertension, moderate aortic stenosis presents to the ER via EMS.  No family available.  Patient unable give history or review of systems.  Reportedly patient was very lethargic.  Reportedly patient's been falling at home.  End-tidal CO2 between 35 and 45 by EMS.  EMS unable to obtain pulse ox.  Initial ABG showed a pH of 7.27 PCO2 80 PO2 68.  Patient placed on BiPAP.  Repeat blood gas an hour later showed worsening of her hypercapnia with a pH of 7.25, PCO2 88, PO2 103.  Patient given albuterol and steroids and patient continued on BiPAP for another hour and a half.  Repeat ABG showed a pH of 7.31, PCO2 of 73, PO2 85  Patient currently on BiPAP therapy.  Unable to give any history review of systems.  Apparently patient had been agitated that she is got bilateral hand mittens on.  Patient completed 10 mg continuous albuterol neb starting at 3:00.  Another 10 mg nebulizer treatment ordered starting at 5 AM.  [facility] hospitalist contacted for admission.

## 2022-08-08 NOTE — Progress Notes (Signed)
Pt is off BiPAP and is currently stable on 5L [place]. BiPAP on standby at bedside if needed. RT will continue to monitor as needed.

## 2022-08-09 DIAGNOSIS — J9621 Acute and chronic respiratory failure with hypoxia: Secondary | ICD-10-CM | POA: Diagnosis not present

## 2022-08-09 DIAGNOSIS — J9622 Acute and chronic respiratory failure with hypercapnia: Secondary | ICD-10-CM | POA: Diagnosis not present

## 2022-08-09 DIAGNOSIS — E611 Iron deficiency: Secondary | ICD-10-CM

## 2022-08-09 DIAGNOSIS — I5032 Chronic diastolic (congestive) heart failure: Secondary | ICD-10-CM | POA: Diagnosis not present

## 2022-08-09 DIAGNOSIS — N1831 Chronic kidney disease, stage 3a: Secondary | ICD-10-CM

## 2022-08-09 DIAGNOSIS — J441 Chronic obstructive pulmonary disease with (acute) exacerbation: Secondary | ICD-10-CM | POA: Diagnosis not present

## 2022-08-09 DIAGNOSIS — I10 Essential (primary) hypertension: Secondary | ICD-10-CM | POA: Diagnosis not present

## 2022-08-09 MED ORDER — POLYVINYL ALCOHOL 1.4 % OP SOLN: 1.0000 [drp] | Freq: Three times a day (TID) | OPHTHALMIC | Status: DC | PRN

## 2022-08-09 MED ORDER — NORTRIPTYLINE HCL 25 MG PO CAPS
50.0000 mg | ORAL_CAPSULE | Freq: Every day | ORAL | Status: DC
  Administered 2022-08-09 – 2022-08-11 (×3): 50 mg via ORAL
  Filled 2022-08-09 (×3): qty 2

## 2022-08-09 MED ORDER — ATORVASTATIN CALCIUM 10 MG PO TABS
10.0000 mg | ORAL_TABLET | Freq: Every day | ORAL | Status: DC
  Administered 2022-08-09 – 2022-08-12 (×4): 10 mg via ORAL
  Filled 2022-08-09 (×4): qty 1

## 2022-08-09 MED ORDER — ALPRAZOLAM 0.5 MG PO TABS
1.0000 mg | ORAL_TABLET | Freq: Three times a day (TID) | ORAL | Status: DC | PRN
  Administered 2022-08-09 – 2022-08-11 (×5): 1 mg via ORAL
  Filled 2022-08-09 (×5): qty 2

## 2022-08-09 MED ORDER — PANTOPRAZOLE SODIUM 40 MG PO TBEC
40.0000 mg | DELAYED_RELEASE_TABLET | Freq: Every day | ORAL | Status: DC
  Administered 2022-08-10 – 2022-08-12 (×3): 40 mg via ORAL
  Filled 2022-08-09 (×3): qty 1

## 2022-08-09 MED ORDER — ASPIRIN 325 MG PO TBEC
325.0000 mg | DELAYED_RELEASE_TABLET | Freq: Every morning | ORAL | Status: DC
  Administered 2022-08-10 – 2022-08-12 (×3): 325 mg via ORAL
  Filled 2022-08-09 (×3): qty 1

## 2022-08-09 MED ORDER — AZITHROMYCIN 500 MG PO TABS
500.0000 mg | ORAL_TABLET | Freq: Every day | ORAL | Status: AC
  Administered 2022-08-10 – 2022-08-12 (×3): 500 mg via ORAL
  Filled 2022-08-09 (×3): qty 1

## 2022-08-09 NOTE — TOC Initial Note (Signed)
Transition of Care [facility]) - Initial/Assessment Note    Patient Details  Name: [name] MRN: [id number] Date of Birth: 1946/01/28  [facility] CM/SW Contact:    Cyndi Bender, RN Phone Number: 08/09/2022, 12:52 PM  Clinical Narrative:                  From home with daughter and grandchildren. Independent with care prior to admission. Daughter transports to and from appointments. Has a cane and home O2 from [place].  PCP is [name], MD [facility] [address].   Paitent states she does not need PT consult and declines SNF consult by MD.   No TOC needs at this time.   Expected Discharge Plan: Home/Self Care Barriers to Discharge: Continued Medical Work up   Patient Goals and CMS Choice Patient states their goals for this hospitalization and ongoing recovery are:: return home w/daughter      Expected Discharge Plan and Services Expected Discharge Plan: Home/Self Care   Discharge Planning Services: CM Consult   Living arrangements for the past 2 months: Single Family Home                                      Prior Living Arrangements/Services Living arrangements for the past 2 months: Single Family Home Lives with:: Adult Children Patient language and need for interpreter reviewed:: Yes Do you feel safe going back to the place where you live?: Yes      Need for Family Participation in Patient Care: Yes (Comment) Care giver support system in place?: Yes (comment) Current home services: DME Criminal Activity/Legal Involvement Pertinent to Current Situation/Hospitalization: No - Comment as needed  Activities of Daily Living      Permission Sought/Granted                  Emotional Assessment Appearance:: Appears stated age Attitude/Demeanor/Rapport: Engaged Affect (typically observed): Accepting Orientation: : Oriented to Self, Oriented to Place, Oriented to  Time, Oriented to Situation Alcohol /  Substance Use: Not Applicable Psych Involvement: No (comment)  Admission diagnosis:  Acute on chronic respiratory failure with hypercapnia (HCC) [J96.22] Acute on chronic respiratory failure with hypoxia and hypercapnia (HCC) [N17.00, J96.22] Patient Active Problem List   Diagnosis Date Noted   Acute on chronic respiratory failure with hypoxia and hypercapnia (HCC) - on home O2 @ 2 L/min 08/08/2022   Lesion of skin of right ear 07/20/2022   Obstipation 04/27/2022   Normocytic anemia 04/27/2022   Abnormal CT of the chest 04/27/2022   Vertigo 04/10/2022   COPD (chronic obstructive pulmonary disease) ([place])    Hyperlipidemia LDL goal <70 01/04/2022   Primary osteoarthritis involving multiple joints 11/17/2021   Atherosclerosis of aorta ([place]) 01/16/2021   Vitamin B12 deficiency anemia due to intrinsic factor deficiency 01/15/2021   Iron deficiency 01/15/2021   Hyperparathyroidism ([place]) 11/04/2020   Chronic idiopathic constipation 05/06/2020   Migraine headache without aura    Chronic renal disease, stage 3, moderately decreased glomerular filtration rate (GFR) between 30-59 mL/min/1.73 square meter 03/20/2019   COPD with acute exacerbation ([place]) 08/24/2017   Chronic respiratory failure with hypoxia ([place]) 08/24/2017   Anxiety 08/24/2017   Insomnia due to anxiety and fear 08/22/2017   Oxygen dependent 08/16/2017   Other emphysema ([place]) 06/14/2014   Coronary artery disease involving native coronary artery of native heart without angina  pectoris 11/06/2013   HTN (hypertension) 11/06/2013   Breast cancer of upper-outer quadrant of right female breast ([place]) 08/21/2013   Osteoporosis 01/01/2013   Chronic diastolic CHF (congestive heart failure) ([place]) 01/31/2012   Moderate to severe aortic stenosis - Last echo 04/2022 showed VTI measures 0.81 cm. Aortic valve mean gradient measures 30.7 mmHg. Aortic valve Vmax measures 3.74 m/s. 08/01/2011    Class: Chronic   PCP:  [name],  MD [id number] [place] [place] [id number]: [phone]: [phone]    Social Determinants of Health (SDOH) Interventions    Readmission Risk Interventions    06/22/2021   10:26 AM  Readmission Risk Prevention Plan  Transportation Screening Complete  PCP or Specialist Appt within 3-5 Days Complete  HRI or [place]  Social Work Consult for [place]seling Complete  Palliative Care Screening Not Applicable  Medication Review (Press photographer Complete

## 2022-08-09 NOTE — Assessment & Plan Note (Signed)
Cell count has been stable at 11,9.  Follow up as outpatient.

## 2022-08-09 NOTE — Assessment & Plan Note (Addendum)
CKD stage 3a Hyponatremia and hyperkalemia.  Renal function today with serum cr at 0,76, K is 3,9 and serum bicarbonate at 35. Na 134  Plan to continue diuretic therapy with furosemide and follow up renal function in am.

## 2022-08-09 NOTE — Progress Notes (Signed)
Patient resting on 4L [place] in no distress at this time. Vitals stable.

## 2022-08-09 NOTE — Progress Notes (Signed)
Pt has PRN Bipap orders, no distress noted at this time. Pt on 3L [place]

## 2022-08-09 NOTE — Progress Notes (Addendum)
Progress Note   Patient: [name] ZLD:[id number] DOB: [birth year] DOA: 08/07/2022     1 DOS: the patient was seen and examined on 08/09/2022   Brief hospital course: [name] was admitted to the hospital with the working diagnosis of COPD exacerbation with acute on chronic hypoxemic and hypercapnic respiratory failure.   76 yo female with the past medical history of COPD, diastolic heart failure and hypertension who presented with lethargy. Patient not able to give detailed history due to encephalopathy, apparently she has been falling at home, EMS was called and she was found hypoxemic. In the ED she was placed on non invasive mechanical ventilation, her blood pressure was 153/64, HR 65, RR 18 and 02 saturation 99% on Bipap.  Ill looking appearing, not responsive to verbal stimuli, lungs with poor air movement, heart with S1 and S2 present and rhythmic, abdomen with no distention and no lower extremity edema.   ABG 7.27/ 80. 68/ 40/ 90%.  Na 133, K 5,2 CL 93 bicarbonate 32 glucose 97 bun 9 cr 0,74  Wbc 5,7 hgb 9,4 plt 289 Sars covid 19 negative   Urine analysis with SG 1,017, negative rbc and negative leukocytes.   Chest radiograph with cardiomegaly with no infiltrates CT chest with centrilobular emphysema, right atelectasis.  Bronchiectasis.  Head and cervical Ct with no acute changes.   EKG 71 bpm, normal axis, normal intervals, sinus rhythm with no significant ST segment or T wave changes.    Assessment and Plan: Chronic idiopathic constipation Constipation noted on CT abd. Given NPO status. Will order enemas.  Acute on chronic respiratory failure with hypoxia and hypercapnia (HCC) - on home O2 @ 2 L/min COPD exacerbation.  Patient with improved ventilation, she has been weaned off to supplemental 02 per [name] Her 02 saturation is 100% on 4 L/min per [name].   Plan to continue oxymetry and supplemental 02 per  to keep 02 saturation 88% or greater.  Continue bronchodilator  therapy and inhaled corticosteroids. Azithromycin for 5 days total.  Acute metabolic encephalopathy due to hypercapnic and hypoxemic respiratory failure. Mentation has improved, now patient off non invasive mechanical ventilation. Continue neuro checks per unit protocol, advance diet to clears.   Chronic diastolic CHF (congestive heart failure) (HCC) Aortic stenosis. No clinical signs of heart failure exacerbation Continue blood pressure monitoring  Plan to resume furosemide in am, at home she is on 40 mg daily.  HTN (hypertension) Blood pressure has been stable, continue to hold on diuretic therapy and resume oral furosemide in am,   Hyperlipidemia LDL goal <70 Stable.  Anxiety Resume alprazolam and nortriptyline   Iron deficiency Cell count has been stable at 11,9.  Follow up as outpatient.   Chronic renal disease, stage 3, moderately decreased glomerular filtration rate (GFR) between 30-59 mL/min/1.73 square meter CKD stage 3a Hyponatremia and hyperkalemia.  Renal function has been stable, repeat K is 4,1 and Na 134. Advance diet to clears and to regular in am. Follow up renal function and electrolytes in am.         Subjective: Patient with improved mentation and ventilation   Physical Exam: Vitals:   08/09/22 0000 08/09/22 0400 08/09/22 0800 08/09/22 0832  BP: (!) 113/47 (!) 130/48 (!) 125/55   Pulse: 73 79 71   Resp: '10 20 13   '$ Temp: 98.7 F (37.1 C) 98.1 F (36.7 C)    TempSrc: Axillary Oral    SpO2: 100% 100% 100% 100%   Neurology awake and alert ENT  with mild pallor Cardiovascular with S1 and S2 present and rhythmic, systolic murmur at the base with no gallops Respiratory with prolonged expiratory phase and rhonchi Abdomen with no distention  No lower extremity edema  Data Reviewed:    Family Communication: no family at the bedside   Disposition: Status is: Inpatient Remains inpatient appropriate because: respiratory failure   Planned  Discharge Destination: Skilled nursing facility      Author: Tawni Millers, MD 08/09/2022 6:33 PM  For on call review [email or web address].

## 2022-08-09 NOTE — Progress Notes (Addendum)
NAME:  [name], MRN:  [id number], DOB:  January 08, 1946, LOS: 1 ADMISSION DATE:  08/07/2022, CONSULTATION DATE:  08/08/22 REFERRING MD:  Tyrell Antonio - [facility], CHIEF COMPLAINT:  SOB   History of Present Illness:  76 yo F PMH breast ca, Emphysema, chronic hypoxic respiratory failure on 3L, diastolic HF, CAD, HLD, AS, presented to ED 11/12 via EMS for AMS, SOB, chest pain. W/ EMS, etCO2 35-45, initial abg 7.21/80/68  CT c/a/p with centrolobular emphysema. Chronic R apical consolidation with associated air bronchograms. Stable LLL subpleural nodule. Scattered mild bronchiectasis    She was placed on BiPAP, started on steroids, and BD. Admitted to [facility]. Repeat ABG with some improvement -- 7.3/73/85/40/9/37.5   She is more alert and has been weaned off BiPAP to [place] is consulted for AECOPD in this setting   In chart review, the patient established care with [facility] 06/2021. She says her most recent inhaler reg is Dulera 1 puff BID and her "rescue inhaler" which she reportedly uses daily.   Pertinent  Medical History  Emphysema Breast cancer Diastolic HF CAD AS HLD Anemia   Significant Hospital Events: Including procedures, antibiotic start and stop dates in addition to other pertinent events   11/12 to ED. Hypercarbic altered. Started on bipap. Admit to [facility]. Pccm consult   Interim History / Subjective:  Feeling improved, wants her xanax and to go home  Objective   Blood pressure (!) 125/55, pulse 71, temperature 98.1 F (36.7 C), temperature source Oral, resp. rate 13, SpO2 100 %.    FiO2 (%):  [40 %] 40 %   Intake/Output Summary (Last 24 hours) at 08/09/2022 1093 Last data filed at 08/09/2022 0600 Gross per 24 hour  Intake 250.07 ml  Output 200 ml  Net 50.07 ml    There were no vitals filed for this visit.  Examination: General: chronically ill older adult F  HENT: NCAT pink mm  Lungs: Symmetrical chest expansion. R>L wheeze, good air movement, no accessory muscle use   Cardiovascular: rr s1s2  Abdomen: soft ndnt  Extremities: RUE chronic edema  Neuro: awake alert. Intermittent confusion, delayed speech  GU: defer  Resolved Hospital Problem list     Assessment & Plan:    Acute encephalopathy - likely due to hypercarbia and hypoxia Acute on chronic respiratory failure with hypoxia and hypercarbia Emphysema with acute exacerbation  Mild bronchiectasis LLL subpleural nodule (65m x 435m -unclear cause of current exacerbation. Viral panel negative -Could be that home reg needs further adjusting - she uses dulera and albuterol, and states she will not take trelegy again, but does not know about other inhalers. It looks like both her pulmonologist and her PCP have made inhaler changes P -feels subjectively improved and near baseline, weaned to 4L, uses 3L at night and prn during the day at baseline -continue IS, pulm hygiene -continue azithro for 5d  -cont. Prednisone  40 x 5 d -adamantly opposed to deviating from her home inhalers. Continue dulera 2puff BID + PRN duoneb -needs prompt outpt follow up with Dr. HuSilas Floodfter discharge, will request appointment in [facility] and "Reselect all SmartList Selections" daily)   Per primary   Labs   CBC: Recent Labs  Lab 08/07/22 1525 08/08/22 0009 08/08/22 0054 08/08/22 0252 08/08/22 0444 08/08/22 0857  WBC 5.7  --   --   --   --   --   NEUTROABS 4.0  --   --   --   --   --  HGB 9.4* 10.2* 9.9* 10.2* 10.5* 11.9*  HCT 33.7* 30.0* 29.0* 30.0* 31.0* 35.0*  MCV 89.9  --   --   --   --   --   PLT 289  --   --   --   --   --      Basic Metabolic Panel: Recent Labs  Lab 08/07/22 1525 08/08/22 0009 08/08/22 0054 08/08/22 0252 08/08/22 0444 08/08/22 0805 08/08/22 0857  NA 137   < > 133* 134* 134* 133* 134*  K 4.8   < > 4.0 4.1 4.1 5.2* 4.1  CL 94*  --   --   --   --  93*  --   CO2 34*  --   --   --   --  32  --   GLUCOSE 83  --   --   --   --  97  --   BUN 10   --   --   --   --  9  --   CREATININE 0.71  --   --   --   --  0.74  --   CALCIUM 9.2  --   --   --   --  8.9  --    < > = values in this interval not displayed.    GFR: CrCl cannot be calculated (Unknown ideal weight.). Recent Labs  Lab 08/07/22 1525  WBC 5.7     Liver Function Tests: Recent Labs  Lab 08/07/22 1525  AST 17  ALT 12  ALKPHOS 45  BILITOT 0.1*  PROT 6.3*  ALBUMIN 3.1*    No results for input(s): "LIPASE", "AMYLASE" in the last 168 hours. Recent Labs  Lab 08/08/22 0003  AMMONIA 34     ABG    Component Value Date/Time   PHART 7.293 (L) 08/08/2022 0857   PCO2ART 79.2 ([place]) 08/08/2022 0857   PO2ART 89 08/08/2022 0857   HCO3 38.4 (H) 08/08/2022 0857   TCO2 41 (H) 08/08/2022 0857   O2SAT 95 08/08/2022 0857     Coagulation Profile: No results for input(s): "INR", "PROTIME" in the last 168 hours.  Cardiac Enzymes: No results for input(s): "CKTOTAL", "CKMB", "CKMBINDEX", "TROPONINI" in the last 168 hours.  HbA1C: Hgb A1c MFr Bld  Date/Time Value Ref Range Status  01/15/2021 02:31 PM 5.1 4.6 - 6.5 % Final    Comment:    Glycemic Control Guidelines for People with Diabetes:Non Diabetic:  <6%Goal of Therapy: <7%Additional Action Suggested:  >8%   10/16/2015 02:12 PM 5.3 4.6 - 6.5 % Final    Comment:    Glycemic Control Guidelines for People with Diabetes:Non Diabetic:  <6%Goal of Therapy: <7%Additional Action Suggested:  >8%     CBG: Recent Labs  Lab 08/07/22 2249  GLUCAP 81     Review of Systems:      Past Medical History:  She,  has a past medical history of Abnormal LFTs (08/02/2011), Acute liver failure (06/19/2013), Acute renal failure ([place]) (06/19/2013), Acute respiratory failure ([place]) (06/19/2013), Altered mental status (08/01/2011), Angina, Anxiety, Anxiety state, unspecified (12/03/2013), Aortic stenosis, Arthritis, Breast cancer, right breast ([place]) (dx'd 2008), CAD (coronary artery disease) of artery bypass graft (11/06/2013), CAD  (coronary artery disease), MI R/O (08/01/2011), CAP (community acquired pneumonia) (09/11/2018), CHF,  acute diastolic, BNP 4k on admissio (08/01/2011), Chronic bronchitis ([place]), Chronic respiratory failure ([place]) (02/02/2012), Compression fracture of L1 lumbar vertebra ([place]) (02/02/2012), COPD (chronic obstructive pulmonary disease) ([place]), Coronary artery disease (2006), Depressed, Diastolic CHF (  [place]), Dyslipidemia, Family history of adverse reaction to anesthesia, GERD (gastroesophageal reflux disease), Heart murmur, History of blood transfusion, 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   Surgical History:   Past Surgical History:  Procedure Laterality Date   BREAST BIOPSY Right 2008   BREAST LUMPECTOMY Right 2008   malignant   CATARACT EXTRACTION W/ INTRAOCULAR LENS  IMPLANT, BILATERAL Bilateral ~ 2013   [place]; 1971; [place]  11/2007   COLOSTOMY CLOSURE  07/2008   CORONARY ANGIOPLASTY WITH STENT PLACEMENT  2006   "2 stents"   ERCP N/A 09/05/2015   Procedure: ENDOSCOPIC RETROGRADE CHOLANGIOPANCREATOGRAPHY (ERCP);  Surgeon: Ladene Artist, MD;  Location: [facility];  Service: Endoscopy;  Laterality: N/A;   MYRINGOTOMY WITH TUBE PLACEMENT Right 04/11/2022   Procedure: TYMPANOSTOMY TUBE RIGHT EAR, EAR CLEANING AND REMOVAL OF FOREIGN BODIES;  Surgeon: Melissa Montane, MD;  Location: [place];  Service: ENT;  Laterality: Right;    PARTIAL COLECTOMY  2009   for obstruction: temporary ostomy, later reversed.    RIGHT HEART CATHETERIZATION N/A 09/05/2013   Procedure: RIGHT HEART CATH;  Surgeon: Jolaine Artist, MD;  Location: [facility] CATH LAB;  Service: Cardiovascular;  Laterality: N/A;   TRANSTHORACIC ECHOCARDIOGRAM  11/12/2011   EF 50-93%, normal systolic function, grade 1 diastolic dysfunction; ventricular septal flattening (D-sign); mild AS; trace-mild MR; LA mildly dilated; RV mod dilated; RA mod dilated; severe pulm HTN; elevated CVP     Social History:   reports that she has been smoking cigarettes. She has a 26.50 pack-year smoking history. She has never used smokeless tobacco. She reports current alcohol use of about 1.0 standard drink of alcohol per week. She reports that she does not use drugs.   Family History:  Her family history includes Alzheimer's disease in her mother; Breast cancer in her sister; Diabetes in her sister; Heart disease in her sister; Hyperlipidemia in her brother; Schizophrenia in her sister. There is no history of Cancer, Stroke, COPD, Depression, Drug abuse, Early death, Hypertension, or Kidney disease.   Allergies Allergies  Allergen Reactions   Ceftriaxone Anaphylaxis, Swelling and Other (See Comments)    *ROCEPHIN* - "Blew up like a balloon"   Hydroxyzine Shortness Of Breath and Other (See Comments)    Pt states med make her light headed, get sob sxs   Other Shortness Of Breath and Other (See Comments)    Unnamed antibiotic for an ear infection- Extreme dizziness, also   Doxycycline Nausea And Vomiting   Lexapro [Escitalopram] Other (See Comments)    Pt states med make her dizzy     Home Medications  Prior to Admission medications   Medication Sig Start Date End Date Taking? Authorizing Provider  ACCRUFER 30 MG CAPS TAKE 1 CAPSULE BY MOUTH EVERY DAY IN THE MORNING AND AT BEDTIME 07/29/22   [name], MD  albuterol (VENTOLIN HFA) 108 (90 Base) MCG/ACT inhaler Inhale 2 puffs  into the lungs every 4 (four) hours as needed for wheezing or shortness of breath. 04/29/22   [name], MD  ALPRAZolam Duanne Moron) 1  MG tablet TAKE 1 TABLET BY MOUTH 3 TIMES DAILY AS NEEDED FOR ANXIETY. Patient taking differently: Take 1 mg by mouth 3 (three) times daily as needed for anxiety. 03/11/22   [name], MD  aspirin EC 325 MG tablet Take 325 mg by mouth in the morning.    [provider]  atorvastatin (LIPITOR) 10 MG tablet TAKE 1 TABLET BY MOUTH EVERY DAY 05/08/22   [name], MD  butalbital-acetaminophen-caffeine (FIORICET) [phone] MG tablet Take 1 tablet by mouth every 4 (four) hours as needed for headache. 06/12/22   [name], MD  Cholecalciferol (VITAMIN D-3) 25 MCG (1000 UT) CAPS Take 1,000 Units by mouth daily.    [provider]  cyanocobalamin (VITAMIN B12) 1000 MCG tablet TAKE 1 TABLET BY MOUTH EVERY DAY 06/27/22   [name], MD  diclofenac Sodium (VOLTAREN) 1 % GEL APPLY 2 GRAMS TO AFFECTED AREA 4 TIMES A DAY 07/07/22   [name], MD  furosemide (LASIX) 40 MG tablet Take 1 tablet (40 mg total) by mouth 2 (two) times daily. 05/12/22   [name], MD  ipratropium (ATROVENT HFA) 17 MCG/ACT inhaler Inhale 2 puffs into the lungs every 4 (four) hours as needed for wheezing. 06/01/22   [name], MD  ipratropium-albuterol (DUONEB) 0.5-2.5 (3) MG/3ML SOLN Inhale 3 mLs into the lungs every 4 (four) hours as needed. Nebulize 3 ml's and inhale into the lungs one to two times a day 07/07/22   [name], MD  KLOR-CON M20 20 MEQ tablet TAKE 1 TABLET BY MOUTH EVERY DAY 05/03/22   [name], MD  lactulose, encephalopathy, (ENULOSE) 10 GM/15ML SOLN Take 45 mLs (30 g total) by mouth 3 (three) times daily as needed (constipation). Patient taking differently: Take 30 g by mouth at bedtime as needed (for constipation). 01/26/22   [name], MD  levocetirizine (XYZAL) 5 MG tablet TAKE 1 TABLET BY MOUTH EVERY DAY IN THE EVENING  07/22/22   [name], MD  LINZESS 72 MCG capsule TAKE 1 CAPSULE BY MOUTH EVERY DAY BEFORE BREAKFAST 05/08/22   [name], MD  LIVALO 2 MG TABS TAKE 1 TABLET BY MOUTH EVERY DAY Patient taking differently: Take 2 mg by mouth daily at 12 noon. 02/17/22   Hilty, Nadean Corwin, MD  meclizine (ANTIVERT) 25 MG tablet Take 12.5-25 mg by mouth 3 (three) times daily as needed for dizziness.    [provider]  mometasone-formoterol (DULERA) 200-5 MCG/ACT AERO Inhale 2 puffs into the lungs 2 (two) times daily. 04/05/22   Hunsucker, Bonna Gains, MD  montelukast (SINGULAIR) 10 MG tablet TAKE 1 TABLET BY MOUTH EVERYDAY AT BEDTIME Patient taking differently: Take 10 mg by mouth at bedtime. 02/25/22   [name], MD  nortriptyline (PAMELOR) 25 MG capsule TAKE 2 CAPSULES BY MOUTH AT BEDTIME 06/01/22   [name], MD  Omega 3 1200 MG CAPS Take 1,200 mg by mouth every morning.     [provider]  OXYGEN Inhale 3 L/min into the lungs See admin instructions. 3 L/min at bedtime and during the day as needed for shortness of breath    [provider]  pantoprazole (PROTONIX) 40 MG tablet TAKE 1 TABLET BY MOUTH EVERY DAY Patient taking differently: Take 40 mg by mouth daily before breakfast. 04/17/22   [name], MD  polyvinyl alcohol (LIQUIFILM TEARS) 1.4 % ophthalmic solution Place 1 drop into both eyes 3 (three) times  daily as needed for dry eyes.    [provider]  senna-docusate (SENOKOT-S) 8.6-50 MG tablet Take 1 tablet by mouth 2 (two) times daily between meals as needed for moderate constipation. 04/28/22   Mercy Riding, MD  vitamin C (ASCORBIC ACID) 500 MG tablet Take 500 mg by mouth daily.    [provider]     Critical care time: n/a     Otilio Carpen Jesus Nevills, PA-C [facility] See Amion for pager If no response to pager , please call [phone] until 7pm After 7:00 pm call Elink  [phone]?[place]

## 2022-08-09 NOTE — Hospital Course (Signed)
[name] was admitted to the hospital with the working diagnosis of COPD exacerbation with acute on chronic hypoxemic and hypercapnic respiratory failure.   76 yo female with the past medical history of COPD, diastolic heart failure and hypertension who presented with lethargy. Patient not able to give detailed history due to encephalopathy, apparently she has been falling at home, EMS was called and she was found hypoxemic. In the ED she was placed on non invasive mechanical ventilation, her blood pressure was 153/64, HR 65, RR 18 and 02 saturation 99% on Bipap.  Ill looking appearing, not responsive to verbal stimuli, lungs with poor air movement, heart with S1 and S2 present and rhythmic, abdomen with no distention and no lower extremity edema.   ABG 7.27/ 80. 68/ 40/ 90%.  Na 133, K 5,2 CL 93 bicarbonate 32 glucose 97 bun 9 cr 0,74  Wbc 5,7 hgb 9,4 plt 289 Sars covid 19 negative   Urine analysis with SG 1,017, negative rbc and negative leukocytes.   Chest radiograph with cardiomegaly with no infiltrates CT chest with centrilobular emphysema, right atelectasis.  Bronchiectasis.  Head and cervical Ct with no acute changes.   EKG 71 bpm, normal axis, normal intervals, sinus rhythm with no significant ST segment or T wave changes.   11/14 patient clinically improving, PT and OT ordered in preparation for possible dc home in 24 to 48 hrs.

## 2022-08-09 NOTE — Progress Notes (Signed)
Heart Failure Navigator Progress Note  Assessed for Heart & Vascular TOC clinic readiness.  Patient does not meet criteria due to COPD exacerbation .   Navigator available for reassessment of patient.   Earnestine Leys, BSN, Clinical cytogeneticist Only

## 2022-08-09 NOTE — Assessment & Plan Note (Signed)
Resume alprazolam and nortriptyline

## 2022-08-10 DIAGNOSIS — I10 Essential (primary) hypertension: Secondary | ICD-10-CM | POA: Diagnosis not present

## 2022-08-10 DIAGNOSIS — F419 Anxiety disorder, unspecified: Secondary | ICD-10-CM

## 2022-08-10 DIAGNOSIS — J9621 Acute and chronic respiratory failure with hypoxia: Secondary | ICD-10-CM | POA: Diagnosis not present

## 2022-08-10 DIAGNOSIS — I5032 Chronic diastolic (congestive) heart failure: Secondary | ICD-10-CM | POA: Diagnosis not present

## 2022-08-10 DIAGNOSIS — E440 Moderate protein-calorie malnutrition: Secondary | ICD-10-CM | POA: Insufficient documentation

## 2022-08-10 DIAGNOSIS — J9622 Acute and chronic respiratory failure with hypercapnia: Secondary | ICD-10-CM | POA: Diagnosis not present

## 2022-08-10 LAB — BASIC METABOLIC PANEL
Anion gap: 10 (ref 5–15)
BUN: 14 mg/dL (ref 8–23)
CO2: 35 mmol/L — ABNORMAL HIGH (ref 22–32)
Calcium: 8.4 mg/dL — ABNORMAL LOW (ref 8.9–10.3)
Chloride: 89 mmol/L — ABNORMAL LOW (ref 98–111)
Creatinine, Ser: 0.76 mg/dL (ref 0.44–1.00)
GFR, Estimated: >60 mL/min (ref >60)
Glucose, Bld: 74 mg/dL (ref 70–99)
Potassium: 3.9 mmol/L (ref 3.5–5.1)
Sodium: 134 mmol/L — ABNORMAL LOW (ref 135–145)

## 2022-08-10 MED ORDER — FUROSEMIDE 40 MG PO TABS
40.0000 mg | ORAL_TABLET | Freq: Two times a day (BID) | ORAL | Status: DC
  Administered 2022-08-10 – 2022-08-12 (×4): 40 mg via ORAL
  Filled 2022-08-10 (×4): qty 1

## 2022-08-10 MED ORDER — ENSURE ENLIVE PO LIQD
237.0000 mL | Freq: Two times a day (BID) | ORAL | Status: DC
  Administered 2022-08-10 – 2022-08-11 (×2): 237 mL via ORAL

## 2022-08-10 MED ORDER — ENOXAPARIN SODIUM 30 MG/0.3ML IJ SOSY
30.0000 mg | PREFILLED_SYRINGE | INTRAMUSCULAR | Status: DC
  Administered 2022-08-10 – 2022-08-11 (×2): 30 mg via SUBCUTANEOUS
  Filled 2022-08-10 (×2): qty 0.3

## 2022-08-10 NOTE — Telephone Encounter (Signed)
Patient is scheduled on 11/30 at 2pm with [place]- reminder mailed to address on file. Nothing further needed.

## 2022-08-10 NOTE — Progress Notes (Signed)
Patients IV came out.  Patient refusing to have another one inserted d/t not getting any meds IV.

## 2022-08-10 NOTE — Evaluation (Signed)
Physical Therapy Evaluation Patient Details Name: [name] MRN: [id number] DOB: 03/30/1946 Today's Date: 08/10/2022  History of Present Illness  Pt is 76 year old presented to [facility] on  08/07/22 with lethargy and falls. Pt with COPD exacerbation with acute on chronic hypoxemic and hypercapnic respiratory failure. Pt with acute metabolic encephalopathy due to the above. PMH - copd, diastolic heart failure, HTN, CAD, breast CA, L1 compression fx,  Clinical Impression  Pt presents to PT with slightly unsteady gait due to illness and inactivity. Expect pt will make good progress back to baseline with mobility. Will follow acutely but doubt pt will need PT after DC. Pt did well with rollator and will recommend for home.         Recommendations for follow up therapy are one component of a multi-disciplinary discharge planning process, led by the attending physician.  Recommendations may be updated based on patient status, additional functional criteria and insurance authorization.  Follow Up Recommendations No PT follow up      Assistance Recommended at Discharge Intermittent Supervision/Assistance  Patient can return home with the following  Help with stairs or ramp for entrance    Equipment Recommendations Rollator (4 wheels)  Recommendations for Other Services       Functional Status Assessment Patient has had a recent decline in their functional status and demonstrates the ability to make significant improvements in function in a reasonable and predictable amount of time.     Precautions / Restrictions Precautions Precautions: Fall      Mobility  Bed Mobility Overal bed mobility: Modified Independent                  Transfers Overall transfer level: Needs assistance Equipment used: Rollator (4 wheels), None Transfers: Sit to/from Stand, Bed to chair/wheelchair/BSC Sit to Stand: Min assist   Step pivot transfers: Min assist       General transfer comment:  Assist for balance. Verbal cues for hand placement when using rollator    Ambulation/Gait Ambulation/Gait assistance: Min guard Gait Distance (Feet): 250 Feet Assistive device: Rollator (4 wheels) Gait Pattern/deviations: Step-through pattern, Decreased stride length Gait velocity: decr Gait velocity interpretation: 1.31 - 2.62 ft/sec, indicative of limited community ambulator   General Gait Details: Assist for balance and safety  Stairs            Wheelchair Mobility    Modified Rankin (Stroke Patients Only)       Balance Overall balance assessment: Needs assistance Sitting-balance support: No upper extremity supported, Feet supported Sitting balance-Leahy Scale: Good     Standing balance support: Single extremity supported, Bilateral upper extremity supported, During functional activity Standing balance-Leahy Scale: Poor Standing balance comment: UE support and min guard for static standing                             Pertinent Vitals/Pain Pain Assessment Pain Assessment: No/denies pain    Home Living Family/patient expects to be discharged to:: Private residence Living Arrangements: Children;Other relatives Available Help at Discharge: Family;Available 24 hours/day Type of Home: House Home Access: Stairs to enter Entrance Stairs-Rails: None Entrance Stairs-Number of Steps: 1   Home Layout: One level Home Equipment: Cane - single point      Prior Function Prior Level of Function : Independent/Modified Independent             Mobility Comments: Uses cane       Hand Dominance   Dominant  Hand: Right    Extremity/Trunk Assessment   Upper Extremity Assessment Upper Extremity Assessment: Defer to OT evaluation    Lower Extremity Assessment Lower Extremity Assessment: Generalized weakness       Communication   Communication: No difficulties  Cognition Arousal/Alertness: Awake/alert Behavior During Therapy: WFL for tasks  assessed/performed Overall Cognitive Status: Impaired/Different from baseline Area of Impairment: Safety/judgement                         Safety/Judgement: Decreased awareness of safety              General Comments General comments (skin integrity, edema, etc.): Pt on 3L O2. Unable to obtain accurate reading with mobility. SpO2 98% after amb when reading obtained    Exercises     Assessment/Plan    PT Assessment Patient needs continued PT services  PT Problem List Decreased strength;Decreased activity tolerance;Decreased balance;Decreased mobility;Decreased safety awareness;Decreased knowledge of use of DME       PT Treatment Interventions DME instruction;Gait training;Functional mobility training;Therapeutic activities;Therapeutic exercise;Balance training;Patient/family education    PT Goals (Current goals can be found in the Care Plan section)  Acute Rehab PT Goals Patient Stated Goal: return home PT Goal Formulation: With patient Time For Goal Achievement: 08/24/22 Potential to Achieve Goals: Good    Frequency Min 3X/week     Co-evaluation               AM-PAC PT "6 Clicks" Mobility  Outcome Measure Help needed turning from your back to your side while in a flat bed without using bedrails?: None Help needed moving from lying on your back to sitting on the side of a flat bed without using bedrails?: None Help needed moving to and from a bed to a chair (including a wheelchair)?: A Little Help needed standing up from a chair using your arms (e.g., wheelchair or bedside chair)?: A Little Help needed to walk in hospital room?: A Little Help needed climbing 3-5 steps with a railing? : A Little 6 Click Score: 20    End of Session Equipment Utilized During Treatment: Gait belt;Oxygen Activity Tolerance: Patient tolerated treatment well Patient left: in chair;with call bell/phone within reach;with chair alarm set Nurse Communication: Mobility status  (tech) PT Visit Diagnosis: Unsteadiness on feet (R26.81);Other abnormalities of gait and mobility (R26.89);Muscle weakness (generalized) (M62.81)    Time: 8563-1497 PT Time Calculation (min) (ACUTE ONLY): 32 min   Charges:   PT Evaluation $PT Eval Moderate Complexity: 1 Mod PT Treatments $Gait Training: 8-22 mins        [place] [facility] [place] 08/10/2022, 4:37 PM

## 2022-08-10 NOTE — Progress Notes (Signed)
Progress Note   Patient: [name] WGN:[id number] DOB: 02-12-1946 DOA: 08/07/2022     2 DOS: the patient was seen and examined on 08/10/2022   Brief hospital course: [name] was admitted to the hospital with the working diagnosis of COPD exacerbation with acute on chronic hypoxemic and hypercapnic respiratory failure.   76 yo female with the past medical history of COPD, diastolic heart failure and hypertension who presented with lethargy. Patient not able to give detailed history due to encephalopathy, apparently she has been falling at home, EMS was called and she was found hypoxemic. In the ED she was placed on non invasive mechanical ventilation, her blood pressure was 153/64, HR 65, RR 18 and 02 saturation 99% on Bipap.  Ill looking appearing, not responsive to verbal stimuli, lungs with poor air movement, heart with S1 and S2 present and rhythmic, abdomen with no distention and no lower extremity edema.   ABG 7.27/ 80. 68/ 40/ 90%.  Na 133, K 5,2 CL 93 bicarbonate 32 glucose 97 bun 9 cr 0,74  Wbc 5,7 hgb 9,4 plt 289 Sars covid 19 negative   Urine analysis with SG 1,017, negative rbc and negative leukocytes.   Chest radiograph with cardiomegaly with no infiltrates CT chest with centrilobular emphysema, right atelectasis.  Bronchiectasis.  Head and cervical Ct with no acute changes.   EKG 71 bpm, normal axis, normal intervals, sinus rhythm with no significant ST segment or T wave changes.   11/14 patient clinically improving, PT and OT ordered in preparation for possible dc home in 24 to 48 hrs.   Assessment and Plan: Chronic idiopathic constipation Constipation noted on CT abd. Given NPO status. Will order enemas.  Acute on chronic respiratory failure with hypoxia and hypercapnia (HCC) COPD exacerbation.  Off Bipap Her 02 saturation is 100% on 3 L/min per [name].   Plan to continue oxymetry and supplemental 02 per [name] to keep 02 saturation 88% or greater.  Continue  bronchodilator therapy and inhaled corticosteroids. Azithromycin for 5 days total.  Acute metabolic encephalopathy due to hypercapnic and hypoxemic respiratory failure. Mentation has improved. Out of bed to chair tid with meals, Pt and Ot.   Chronic diastolic CHF (congestive heart failure) (HCC) Echocardiogram with preserved LV systolic function EF 60 to 65% with mild LVH, preserved RV systolic function, moderate to severe aortic stenosis.  No clinical signs of heart failure exacerbation Continue blood pressure monitoring   Resume diuretic therapy with furosemide 40 mg po daily.   HTN (hypertension) Systolic blood pressure 784 to 117 mmHg. Continue close blood pressure monitoring.   Hyperlipidemia LDL goal <70 Continue with atorvastatin.   Anxiety Resume alprazolam and nortriptyline   Iron deficiency Cell count has been stable at 11,9.  Follow up as outpatient.   Chronic renal disease, stage 3, moderately decreased glomerular filtration rate (GFR) between 30-59 mL/min/1.73 square meter CKD stage 3a Hyponatremia and hyperkalemia.  Renal function today with serum cr at 0,76, K is 3,9 and serum bicarbonate at 35. Na 134  Plan to continue diuretic therapy with furosemide and follow up renal function in am.         Subjective: Patient with improvement in dyspnea, continue to be very weak and deconditioned, she has been off Bipap   Physical Exam: Vitals:   08/09/22 1945 08/09/22 1958 08/10/22 0400 08/10/22 0848  BP: (!) 155/59  (!) 120/56 (!) 117/54  Pulse:   64 76  Resp:   (!) 21 (!) 21  Temp: 98.7  F (37.1 C)  98.2 F (36.8 C) 98.1 F (36.7 C)  TempSrc: Oral  Oral Oral  SpO2:  100% 100% 100%   Neurology awake and alert ENT with mild pallor Cardiovascular with S1 and S2 present and rhythmic with no gallops, positive murmur at the base, systolic 3/6. Respiratory with expiratory wheezing and scattered rhonchi bilaterally Abdomen with no distention  No lower  extremity edema  Data Reviewed:    Family Communication: I spoke with patient's daughter [name] over the phone, we talked in detail about patient's condition, plan of care and prognosis and all questions were addressed.    Disposition: Status is: Inpatient Remains inpatient appropriate because: recovering respiratory failure   Planned Discharge Destination: Home     Author: Tawni Millers, MD 08/10/2022 10:51 AM  For on call review [email or web address].

## 2022-08-10 NOTE — Progress Notes (Signed)
Initial Nutrition Assessment  DOCUMENTATION CODES:   Non-severe (moderate) malnutrition in context of chronic illness  INTERVENTION:  Continue Regular diet as ordered Ensure Enlive po BID, each supplement provides 350 kcal and 20 grams of protein. Request updated weight  NUTRITION DIAGNOSIS:   Moderate Malnutrition related to chronic illness (CHF, COPD) as evidenced by mild fat depletion, moderate muscle depletion, severe muscle depletion.  GOAL:   Patient will meet greater than or equal to 90% of their needs  MONITOR:   PO intake, Supplement acceptance, Labs, Weight trends  REASON FOR ASSESSMENT:   Consult Assessment of nutrition requirement/status  ASSESSMENT:   Pt admitted from home with lethargy. PMH significant for COPD, chronic diastolic HF, chronic hypercapnic/hypoxic respiratory failure on home O2, chronic constipation, HTN, moderate AS.  Noted plans for possible d/c within 1-2 days.   Spoke with pt who was preparing to work with PT.   Pt reports eating well at home. She was eating 3-4 smaller meals daily to help with nutritional adequacy. She recalls eating egg salad, tuna salad, chicken or some other meat prepared in a Crockpot, fruit and vegetables. Her daughter helps prepare meals for her. Denies chewing/swallowing difficulties.   She states that her usual weight is about 110 lbs and denies significant recent weight loss. Last documented weight was 48.5 kg on 10/24. This looks to be an 8.6% weight loss since 08/02 which is clinically significant for time frame. Within the last year, it appears her weight has fluctuated up and down which is likely r/t her h/o HF.  She has been taking Vitamin D3, B12, magnesium and iron at home. She states that she was advised not to take a MVI but unable to recall which doctor mentioned this and why. She endorses chronic constipation which she treats with linzess.   Medications: zithromax, linzess, protonix, prednisone,  senna  Labs: sodium 134   NUTRITION - FOCUSED PHYSICAL EXAM:  Flowsheet Row Most Recent Value  Upper Arm Region Moderate depletion  [L>R d/t lymphedema]  Thoracic and Lumbar Region Mild depletion  Buccal Region Mild depletion  Temple Region Mild depletion  Clavicle Bone Region Severe depletion  Clavicle and Acromion Bone Region Moderate depletion  Scapular Bone Region Moderate depletion  Dorsal Hand Mild depletion  Patellar Region Severe depletion  Anterior Thigh Region Severe depletion  Posterior Calf Region Severe depletion  Edema (RD Assessment) Mild  [RUE]  Hair Reviewed  Eyes Reviewed  Mouth Reviewed  Skin Reviewed  Nails Reviewed      Diet Order:   Diet Order             Diet regular Room service appropriate? Yes; Fluid consistency: Thin  Diet effective now                   EDUCATION NEEDS:   Education needs have been addressed  Skin:  Skin Assessment: Reviewed RN Assessment  Last BM:  11/12  Height:   Ht Readings from Last 1 Encounters:  07/20/22 '5\' 2"'$  (1.575 m)    Weight:   Wt Readings from Last 1 Encounters:  07/20/22 48.5 kg   BMI:  There is no height or weight on file to calculate BMI.  Estimated Nutritional Needs:   Kcal:  1300-1500  Protein:  65-80g  Fluid:  1.3-1.5L  Clayborne Dana, RDN, LDN Clinical Nutrition

## 2022-08-11 DIAGNOSIS — I5032 Chronic diastolic (congestive) heart failure: Secondary | ICD-10-CM | POA: Diagnosis not present

## 2022-08-11 DIAGNOSIS — N1831 Chronic kidney disease, stage 3a: Secondary | ICD-10-CM | POA: Diagnosis not present

## 2022-08-11 DIAGNOSIS — J9622 Acute and chronic respiratory failure with hypercapnia: Secondary | ICD-10-CM | POA: Diagnosis not present

## 2022-08-11 LAB — BASIC METABOLIC PANEL
Anion gap: 8 (ref 5–15)
BUN: 12 mg/dL (ref 8–23)
CO2: 37 mmol/L — ABNORMAL HIGH (ref 22–32)
Calcium: 8.5 mg/dL — ABNORMAL LOW (ref 8.9–10.3)
Chloride: 89 mmol/L — ABNORMAL LOW (ref 98–111)
Creatinine, Ser: 0.8 mg/dL (ref 0.44–1.00)
GFR, Estimated: >60 mL/min (ref >60)
Glucose, Bld: 90 mg/dL (ref 70–99)
Potassium: 3.4 mmol/L — ABNORMAL LOW (ref 3.5–5.1)
Sodium: 134 mmol/L — ABNORMAL LOW (ref 135–145)

## 2022-08-11 MED ORDER — BISACODYL 5 MG PO TBEC
10.0000 mg | DELAYED_RELEASE_TABLET | Freq: Once | ORAL | Status: AC
  Administered 2022-08-11: 10 mg via ORAL
  Filled 2022-08-11: qty 2

## 2022-08-11 MED ORDER — POLYETHYLENE GLYCOL 3350 17 G PO PACK
34.0000 g | PACK | Freq: Once | ORAL | Status: AC
  Administered 2022-08-11: 34 g via ORAL
  Filled 2022-08-11: qty 2

## 2022-08-11 MED ORDER — MAGNESIUM CITRATE PO SOLN: 0.5000 | Freq: Once | ORAL | Status: DC

## 2022-08-11 MED ORDER — POLYETHYLENE GLYCOL 3350 17 G PO PACK
17.0000 g | PACK | Freq: Every day | ORAL | Status: DC
  Administered 2022-08-11 – 2022-08-12 (×2): 17 g via ORAL
  Filled 2022-08-11 (×2): qty 1

## 2022-08-11 MED ORDER — LINACLOTIDE 145 MCG PO CAPS
145.0000 ug | ORAL_CAPSULE | Freq: Once | ORAL | Status: AC
  Administered 2022-08-11: 145 ug via ORAL
  Filled 2022-08-11: qty 1

## 2022-08-11 MED ORDER — POTASSIUM CHLORIDE CRYS ER 20 MEQ PO TBCR
30.0000 meq | EXTENDED_RELEASE_TABLET | Freq: Four times a day (QID) | ORAL | Status: AC
  Administered 2022-08-11 (×2): 30 meq via ORAL
  Filled 2022-08-11 (×2): qty 1

## 2022-08-11 NOTE — Evaluation (Signed)
Occupational Therapy Evaluation Patient Details Name: [name] MRN: [id number] DOB: April 02, 1946 Today's Date: 08/11/2022   History of Present Illness Pt is 76 year old presented to [facility] on  08/07/22 with lethargy and falls. Pt with COPD exacerbation with acute on chronic hypoxemic and hypercapnic respiratory failure. Pt with acute metabolic encephalopathy due to the above. PMH - copd, diastolic heart failure, HTN, CAD, breast CA, L1 compression fx,   Clinical Impression   Patient admitted for the diagnosis above.  Patient hoping to discharge home tomorrow, appears close to her baseline for in room mobility and basic ADL completion.  No significant OT needs exist in the acute setting and no post acute OT is anticipated.        Recommendations for follow up therapy are one component of a multi-disciplinary discharge planning process, led by the attending physician.  Recommendations may be updated based on patient status, additional functional criteria and insurance authorization.   Follow Up Recommendations  No OT follow up     Assistance Recommended at Discharge Intermittent Supervision/Assistance  Patient can return home with the following Assist for transportation;Assistance with cooking/housework    Functional Status Assessment  Patient has not had a recent decline in their functional status  Equipment Recommendations  None recommended by OT    Recommendations for Other Services       Precautions / Restrictions Precautions Precautions: Fall Restrictions Weight Bearing Restrictions: No      Mobility Bed Mobility Overal bed mobility: Modified Independent               Patient Response: Cooperative  Transfers Overall transfer level: Needs assistance Equipment used: Rolling walker (2 wheels) Transfers: Sit to/from Stand, Bed to chair/wheelchair/BSC Sit to Stand: Supervision     Step pivot transfers: Supervision            Balance Overall balance  assessment: Needs assistance Sitting-balance support: No upper extremity supported, Feet supported Sitting balance-Leahy Scale: Good     Standing balance support: Reliant on assistive device for balance Standing balance-Leahy Scale: Fair                             ADL either performed or assessed with clinical judgement   ADL Overall ADL's : At baseline                                       General ADL Comments: generalized supervision     Vision Patient Visual Report: No change from baseline       Perception     Praxis      Pertinent Vitals/Pain Pain Assessment Pain Assessment: No/denies pain     Hand Dominance Right   Extremity/Trunk Assessment Upper Extremity Assessment Upper Extremity Assessment: Overall WFL for tasks assessed   Lower Extremity Assessment Lower Extremity Assessment: Defer to PT evaluation   Cervical / Trunk Assessment Cervical / Trunk Assessment: Kyphotic   Communication Communication Communication: No difficulties   Cognition Arousal/Alertness: Awake/alert Behavior During Therapy: WFL for tasks assessed/performed Overall Cognitive Status: Impaired/Different from baseline                                       General Comments       Exercises     Shoulder  Instructions      Home Living Family/patient expects to be discharged to:: Private residence Living Arrangements: Children;Other relatives Available Help at Discharge: Family;Available 24 hours/day Type of Home: House Home Access: Stairs to enter [employer] of Steps: 1 Entrance Stairs-Rails: None Home Layout: One level     Bathroom Shower/Tub: Teacher, early years/pre: Standard Bathroom Accessibility: Yes How Accessible: Accessible via walker Home Equipment: [place] - single point          Prior Functioning/Environment Prior Level of Function : Independent/Modified Independent             Mobility  Comments: Uses cane ADLs Comments: sponge bathes, and has supervision for showers        OT Problem List: Impaired balance (sitting and/or standing)      OT Treatment/Interventions:      OT Goals(Current goals can be found in the care plan section) Acute Rehab OT Goals Patient Stated Goal: Hoping to return home tomorrow OT Goal Formulation: With patient Time For Goal Achievement: 08/16/22 Potential to Achieve Goals: Good  OT Frequency:      Co-evaluation              AM-PAC OT "6 Clicks" Daily Activity     Outcome Measure Help from another person eating meals?: None Help from another person taking care of personal grooming?: None Help from another person toileting, which includes using toliet, bedpan, or urinal?: A Little Help from another person bathing (including washing, rinsing, drying)?: A Little Help from another person to put on and taking off regular upper body clothing?: None Help from another person to put on and taking off regular lower body clothing?: A Little 6 Click Score: 21   End of Session Equipment Utilized During Treatment: Rolling walker (2 wheels) Nurse Communication: Mobility status  Activity Tolerance: Patient tolerated treatment well Patient left: in bed;with call bell/phone within reach  OT Visit Diagnosis: Unsteadiness on feet (R26.81)                Time: 6440-3474 OT Time Calculation (min): 18 min Charges:  OT General Charges $OT Visit: 1 Visit OT Evaluation $OT Eval Moderate Complexity: 1 Mod  08/11/2022  RP, OTR/L  Acute Rehabilitation Services  Office:  [phone]   Metta Clines 08/11/2022, 9:52 AM

## 2022-08-11 NOTE — Plan of Care (Signed)
  Problem: Education: Goal: Knowledge of disease or condition will improve Outcome: Progressing   Problem: Activity: Goal: Ability to tolerate increased activity will improve Outcome: Progressing   Problem: Respiratory: Goal: Ability to maintain a clear airway will improve Outcome: Progressing   Problem: Education: Goal: Knowledge of General Education information will improve Description: Including pain rating scale, medication(s)/side effects and non-pharmacologic comfort measures Outcome: Progressing   Problem: Health Behavior/Discharge Planning: Goal: Ability to manage health-related needs will improve Outcome: Progressing   Problem: Activity: Goal: Risk for activity intolerance will decrease Outcome: Progressing   Problem: Coping: Goal: Level of anxiety will decrease Outcome: Progressing

## 2022-08-11 NOTE — Progress Notes (Signed)
BIPAP not needed at this time, patient currently on 3L [place] tolerating well.

## 2022-08-11 NOTE — Progress Notes (Signed)
PROGRESS NOTE    [name]  WER:[id number] DOB: 22-Oct-1945 DOA: 08/07/2022 PCP: [name], MD    Chief Complaint  Patient presents with   Fatigue    Brief Narrative:   [name] was admitted to the hospital with the working diagnosis of COPD exacerbation with acute on chronic hypoxemic and hypercapnic respiratory failure.    76 yo female with the past medical history of COPD, diastolic heart failure and hypertension who presented with lethargy. Patient not able to give detailed history due to encephalopathy, apparently she has been falling at home, EMS was called and she was found hypoxemic. In the ED she was placed on non invasive mechanical ventilation, her blood pressure was 153/64, HR 65, RR 18 and 02 saturation 99% on Bipap.  Ill looking appearing, not responsive to verbal stimuli, lungs with poor air movement, heart with S1 and S2 present and rhythmic, abdomen with no distention and no lower extremity edema.    ABG 7.27/ 80. 68/ 40/ 90%.  Na 133, K 5,2 CL 93 bicarbonate 32 glucose 97 bun 9 cr 0,74  Wbc 5,7 hgb 9,4 plt 289 Sars covid 19 negative   Urine analysis with SG 1,017, negative rbc and negative leukocytes.    Chest radiograph with cardiomegaly with no infiltrates CT chest with centrilobular emphysema, right atelectasis.  Bronchiectasis.  Head and cervical Ct with no acute changes.    EKG 71 bpm, normal axis, normal intervals, sinus rhythm with no significant ST segment or T wave changes.      Assessment & Plan:   Active Problems:   Acute on chronic respiratory failure with hypoxia and hypercapnia (HCC)   Chronic diastolic CHF (congestive heart failure) (HCC)   HTN (hypertension)   Hyperlipidemia LDL goal <70   Anxiety   Chronic renal disease, stage 3, moderately decreased glomerular filtration rate (GFR) between 30-59 mL/min/1.73 square meter   Iron deficiency   Malnutrition of moderate degree   Chronic idiopathic constipation Constipation  noted on CT abd. with laxatives.   Acute on chronic respiratory failure with hypoxia and hypercapnia (HCC) COPD exacerbation.  Off Bipap, BiPAP requirement over last 24 hours, hopefully can DC home if remains on the 24 hours of BiPAP. Her 02 saturation is 100% on 3 L/min per [place].    Plan to continue oxymetry and supplemental 02 per [place] to keep 02 saturation 88% or greater.  Continue bronchodilator therapy and inhaled corticosteroids. Azithromycin for 5 days total.   Acute metabolic encephalopathy due to hypercapnic and hypoxemic respiratory failure. Mentation has improved. Out of bed to chair tid with meals, Pt and Ot.    Chronic diastolic CHF (congestive heart failure) (HCC) Echocardiogram with preserved LV systolic function EF 60 to 65% with mild LVH, preserved RV systolic function, moderate to severe aortic stenosis.  No clinical signs of heart failure exacerbation Continue blood pressure monitoring  -She appears to be euvolemic, continue with home dose Lasix.   HTN (hypertension) Systolic blood pressure 195 to 117 mmHg. Continue close blood pressure monitoring.    Hyperlipidemia LDL goal <70 Continue with atorvastatin.    Anxiety Resume alprazolam and nortriptyline    Iron deficiency Cell count has been stable at 11,9.  Follow up as outpatient.    Chronic renal disease, stage 3, moderately decreased glomerular filtration rate (GFR) between 30-59 mL/min/1.73 square meter CKD stage 3a Hyponatremia and hyperkalemia.   Renal function today with serum cr at 0,76, K is 3,9 and serum bicarbonate at 35. Na  134  Plan to continue diuretic therapy with furosemide and follow up renal function in am.    Hypokalemia - repleted   DVT prophylaxis: (Lovenox) Code Status: (Full) Family Communication: none at bedside Disposition:   Status is: Inpatient    Consultants:  None   Subjective:  For dyspnea much improved, still present, denies fever, chills or  cough  Objective: Vitals:   08/11/22 0400 08/11/22 0420 08/11/22 0800 08/11/22 1214  BP: (!) 120/52  (!) 119/55 139/60  Pulse: 66  69 75  Resp: 17  18   Temp:   98.4 F (36.9 C) 98.1 F (36.7 C)  TempSrc: Oral  Axillary Oral  SpO2: 100%  98% 96%  Weight:  43.4 kg    Height:        Intake/Output Summary (Last 24 hours) at 08/11/2022 1434 Last data filed at 08/10/2022 2000 Gross per 24 hour  Intake 600 ml  Output --  Net 600 ml   Filed Weights   08/11/22 0420  Weight: 43.4 kg    Examination:  Awake Alert, Oriented X 3, extremely frail, deconditioned. Symmetrical Chest wall movement, scattered wheezing bilaterally RRR,No Gallops,Rubs or new Murmurs, No Parasternal Heave +ve B.Sounds, Abd Soft, No tenderness, No rebound - guarding or rigidity. No Cyanosis, Clubbing or edema, No new Rash or bruise    Data Reviewed: I have personally reviewed following labs and imaging studies  CBC: Recent Labs  Lab 08/07/22 1525 08/08/22 0009 08/08/22 0054 08/08/22 0252 08/08/22 0444 08/08/22 0857  WBC 5.7  --   --   --   --   --   NEUTROABS 4.0  --   --   --   --   --   HGB 9.4* 10.2* 9.9* 10.2* 10.5* 11.9*  HCT 33.7* 30.0* 29.0* 30.0* 31.0* 35.0*  MCV 89.9  --   --   --   --   --   PLT 289  --   --   --   --   --     Basic Metabolic Panel: Recent Labs  Lab 08/07/22 1525 08/08/22 0009 08/08/22 0444 08/08/22 0805 08/08/22 0857 08/10/22 0300 08/11/22 0357  NA 137   < > 134* 133* 134* 134* 134*  K 4.8   < > 4.1 5.2* 4.1 3.9 3.4*  CL 94*  --   --  93*  --  89* 89*  CO2 34*  --   --  32  --  35* 37*  GLUCOSE 83  --   --  97  --  74 90  BUN 10  --   --  9  --  14 12  CREATININE 0.71  --   --  0.74  --  0.76 0.80  CALCIUM 9.2  --   --  8.9  --  8.4* 8.5*   < > = values in this interval not displayed.    GFR: Estimated Creatinine Clearance: 41 mL/min (by C-G formula based on SCr of 0.8 mg/dL).  Liver Function Tests: Recent Labs  Lab 08/07/22 1525  AST 17  ALT  12  ALKPHOS 45  BILITOT 0.1*  PROT 6.3*  ALBUMIN 3.1*    CBG: Recent Labs  Lab 08/07/22 2249  GLUCAP 81     Recent Results (from the past 240 hour(s))  Resp Panel by RT-PCR (Flu A&B, Covid) Anterior Nasal Swab     Status: None   Collection Time: 08/08/22  6:16 AM   Specimen: Anterior Nasal Swab  Result  Value Ref Range Status   SARS Coronavirus 2 by RT PCR NEGATIVE NEGATIVE Final    Comment: (NOTE) SARS-CoV-2 target nucleic acids are NOT DETECTED.  The SARS-CoV-2 RNA is generally detectable in upper respiratory specimens during the acute phase of infection. The lowest concentration of SARS-CoV-2 viral copies this assay can detect is 138 copies/mL. A negative result does not preclude SARS-Cov-2 infection and should not be used as the sole basis for treatment or other patient management decisions. A negative result may occur with  improper specimen collection/handling, submission of specimen other than nasopharyngeal swab, presence of viral mutation(s) within the areas targeted by this assay, and inadequate number of viral copies(<138 copies/mL). A negative result must be combined with clinical observations, patient history, and epidemiological information. The expected result is Negative.  Fact Sheet for Patients:  EntrepreneurPulse.com.au  Fact Sheet for Healthcare Providers:  IncredibleEmployment.be  This test is no t yet approved or cleared by the [place] FDA and  has been authorized for detection and/or diagnosis of SARS-CoV-2 by FDA under an Emergency Use Authorization (EUA). This EUA will remain  in effect (meaning this test can be used) for the duration of the COVID-19 declaration under Section 564(b)(1) of the Act, 21 U.S.C.section 360bbb-3(b)(1), unless the authorization is terminated  or revoked sooner.       Influenza A by PCR NEGATIVE NEGATIVE Final   Influenza B by PCR NEGATIVE NEGATIVE Final    Comment:  (NOTE) The Xpert Xpress SARS-CoV-2/FLU/RSV plus assay is intended as an aid in the diagnosis of influenza from Nasopharyngeal swab specimens and should not be used as a sole basis for treatment. Nasal washings and aspirates are unacceptable for Xpert Xpress SARS-CoV-2/FLU/RSV testing.  Fact Sheet for Patients: EntrepreneurPulse.com.au  Fact Sheet for Healthcare Providers: IncredibleEmployment.be  This test is not yet approved or cleared by the [place] FDA and has been authorized for detection and/or diagnosis of SARS-CoV-2 by FDA under an Emergency Use Authorization (EUA). This EUA will remain in effect (meaning this test can be used) for the duration of the COVID-19 declaration under Section 564(b)(1) of the Act, 21 U.S.C. section 360bbb-3(b)(1), unless the authorization is terminated or revoked.  Performed at [facility], [place] [address], [name], [place] [address]   Respiratory (~20 pathogens) panel by PCR     Status: None   Collection Time: 08/08/22  8:15 AM   Specimen: Nasopharyngeal Swab; Respiratory  Result Value Ref Range Status   Adenovirus NOT DETECTED NOT DETECTED Final   Coronavirus 229E NOT DETECTED NOT DETECTED Final    Comment: (NOTE) The Coronavirus on the Respiratory Panel, DOES NOT test for the novel  Coronavirus (2019 nCoV)    Coronavirus HKU1 NOT DETECTED NOT DETECTED Final   Coronavirus NL63 NOT DETECTED NOT DETECTED Final   Coronavirus OC43 NOT DETECTED NOT DETECTED Final   Metapneumovirus NOT DETECTED NOT DETECTED Final   Rhinovirus / Enterovirus NOT DETECTED NOT DETECTED Final   Influenza A NOT DETECTED NOT DETECTED Final   Influenza B NOT DETECTED NOT DETECTED Final   Parainfluenza Virus 1 NOT DETECTED NOT DETECTED Final   Parainfluenza Virus 2 NOT DETECTED NOT DETECTED Final   Parainfluenza Virus 3 NOT DETECTED NOT DETECTED Final   Parainfluenza Virus 4 NOT DETECTED NOT DETECTED Final   Respiratory  Syncytial Virus NOT DETECTED NOT DETECTED Final   Bordetella pertussis NOT DETECTED NOT DETECTED Final   Bordetella Parapertussis NOT DETECTED NOT DETECTED Final   Chlamydophila pneumoniae NOT DETECTED NOT  DETECTED Final   Mycoplasma pneumoniae NOT DETECTED NOT DETECTED Final    Comment: Performed at [facility], [place] [address], [place], [place] [address]         Radiology Studies: No results found.      Scheduled Meds:  albuterol  5 mg Nebulization Once   aspirin EC  325 mg Oral q AM   atorvastatin  10 mg Oral Daily   azithromycin  500 mg Oral Daily   enoxaparin (LOVENOX) injection  30 mg Subcutaneous Q24H   feeding supplement  237 mL Oral BID BM   furosemide  40 mg Oral BID   ipratropium-albuterol  3 mL Nebulization BID   linaclotide  145 mcg Oral QAC breakfast   mometasone-formoterol  2 puff Inhalation BID   nortriptyline  50 mg Oral QHS   pantoprazole  40 mg Oral Daily   predniSONE  40 mg Oral Q breakfast   senna-docusate  1 tablet Oral BID   Continuous Infusions:  albuterol 10 mg/hr (08/08/22 0540)     LOS: 3 days       Phillips Climes, MD [facility]   To contact the attending provider between 7A-7P or the covering provider during after hours 7P-7A, please log into the web site [email or web address] and access using universal [place] password for that web site. If you do not have the password, please call the hospital operator.  08/11/2022, 2:34 PM

## 2022-08-11 NOTE — TOC Progression Note (Signed)
Transition of Care [facility]) - Progression Note    Patient Details  Name: [name] MRN: [id number] Date of Birth: [birth year]  Transition of Care [facility]) CM/SW Contact  Carles Collet, RN Phone Number: 08/11/2022, 8:41 AM  Clinical Narrative:     Ordered rollator to be delivered to the room through [facility]  Expected Discharge Plan: Home/Self Care Barriers to Discharge: Continued Medical Work up  Expected Discharge Plan and Services Expected Discharge Plan: Home/Self Care   Discharge Planning Services: CM Consult   Living arrangements for the past 2 months: Single Family Home                 DME Arranged: Walker rolling with seat DME Agency: [employer] Date DME Agency Contacted: 08/10/22 Time DME Agency Contacted: 1600 Representative spoke with at DME Agency: [facility] ([place]) Interventions    Readmission Risk Interventions    06/22/2021   10:26 AM  Readmission Risk Prevention Plan  Transportation Screening Complete  PCP or Specialist Appt within 3-5 Days Complete  HRI or [place] Complete  Social Work Consult for [place] Planning/Counseling Complete  Palliative Care Screening Not Applicable  Medication Review [employer]) Complete

## 2022-08-11 NOTE — Care Management Important Message (Signed)
Important Message  Patient Details  Name: [name] MRN: [id number] Date of Birth: [birth year]   Medicare Important Message Given:  Yes     Orbie Pyo 08/11/2022, 2:45 PM

## 2022-08-12 ENCOUNTER — Other Ambulatory Visit (HOSPITAL_COMMUNITY): Payer: Self-pay

## 2022-08-12 DIAGNOSIS — I5032 Chronic diastolic (congestive) heart failure: Secondary | ICD-10-CM | POA: Diagnosis not present

## 2022-08-12 DIAGNOSIS — J9622 Acute and chronic respiratory failure with hypercapnia: Secondary | ICD-10-CM | POA: Diagnosis not present

## 2022-08-12 MED ORDER — POTASSIUM CHLORIDE CRYS ER 20 MEQ PO TBCR
40.0000 meq | EXTENDED_RELEASE_TABLET | Freq: Once | ORAL | Status: AC
  Administered 2022-08-12: 40 meq via ORAL
  Filled 2022-08-12: qty 2

## 2022-08-12 MED ORDER — PREDNISONE 10 MG (21) PO TBPK
ORAL_TABLET | ORAL | 0 refills | Status: DC
  Filled 2022-08-12: qty 21, 6d supply, fill #0

## 2022-08-12 NOTE — Consult Note (Signed)
   [facility] Inpatient Consult   08/12/2022  MOYINOLUWA DAWE [birth year] [id number]  [place] Organization [ACO] Patient: Medicare ACO REACH  Primary Care Provider:  [name], MD with [facility] which is listed to provide the transition of care follow up   Patient screened for hospitalization with noted high risk score for unplanned readmission risk and to assess for potential [place] Management service needs for post hospital transition for care coordination.  Review of patient's electronic medical record reveals patient is for home with DME as reviewed of inpatient [facility] RNCM notes , PT/OT evaluations and post hospital needs. Admitted with acute respiratory failure with home oxygen prior to admission noted.  Plan:  Currently, patient is to receive post hospital follow up for transition, patient could benefit from a smoking cessation program if agreeable.  Of note, [facility] Care Management/Population Health does not replace or interfere with any arrangements made by the Inpatient Transition of Care team.  For questions contact:   Natividad Brood, RN BSN [place]  [phone] business mobile phone Toll free office [phone]  *[place]  [phone] Fax number: [phone] [email or web address]

## 2022-08-12 NOTE — Discharge Summary (Signed)
Physician Discharge Summary  [name] DXA:[id number] DOB: 22-Sep-1946 DOA: 08/07/2022  PCP: [name], MD  Admit date: 08/07/2022 Discharge date: 08/12/2022  Admitted From: (Home) Disposition:  (Home)  Recommendations for Outpatient Follow-up:  Follow up with PCP in 1-2 weeks Please obtain BMP/CBC in one week    Discharge Condition: (Stable) CODE STATUS: (FULL) Diet recommendation: Heart Healthy  Brief/Interim Summary:  [name] was admitted to the hospital with the working diagnosis of COPD exacerbation with acute on chronic hypoxemic and hypercapnic respiratory failure.    76 yo female with the past medical history of COPD, diastolic heart failure and hypertension who presented with lethargy. apparently she has been falling at home, EMS was called and she was found hypoxemic. In the ED she was placed on non invasive mechanical ventilation, her blood pressure was 153/64, HR 65, RR 18 and 02 saturation 99% on Bipap. As her ABG 7.27/ 80. 68/ 40/ 90%. Chest radiograph with cardiomegaly with no infiltrates CT chest with centrilobular emphysema, right atelectasis.  Bronchiectasis.  Head and cervical Ct with no acute changes.      Acute on chronic respiratory failure with hypoxia and hypercapnia (HCC) COPD exacerbation.  He did require BiPAP initially, she is currently off BiPAP for last 48 hours, back at her baseline on 3 L nasal cannula . -Secondary to COPD exacerbation for which she was treated with antibiotic and steroid, respiratory status back to baseline, she will be discharged on prednisone taper   Chronic diastolic CHF (congestive heart failure) (HCC) Echocardiogram with preserved LV systolic function EF 60 to 65% with mild LVH, preserved RV systolic function, moderate to severe aortic stenosis.  No clinical signs of heart failure exacerbation Continue blood pressure monitoring  -She appears to be euvolemic, continue with home dose Lasix.   HTN  (hypertension)    Hyperlipidemia LDL goal <70 Continue with atorvastatin.    Chronic idiopathic constipation Constipation noted on CT abd. with laxatives.   Anxiety Resume alprazolam and nortriptyline    Iron deficiency Cell count has been stable at 11,9.  Follow up as outpatient.    Chronic renal disease, stage 3a, moderately decreased glomerular filtration rate (GFR) between 30-59 mL/min/1.73 square meter CKD stage 3a Hyponatremia and hyperkalemia. - repleted   Hypokalemia - repleted   Discharge Diagnoses:  Active Problems:   Acute on chronic respiratory failure with hypoxia and hypercapnia (HCC)   Chronic diastolic CHF (congestive heart failure) (HCC)   HTN (hypertension)   Hyperlipidemia LDL goal <70   Anxiety   Chronic renal disease, stage 3, moderately decreased glomerular filtration rate (GFR) between 30-59 mL/min/1.73 square meter   Iron deficiency   Malnutrition of moderate degree    Discharge Instructions  Discharge Instructions     Diet - low sodium heart healthy   Complete by: As directed    Discharge instructions   Complete by: As directed    Follow with Primary MD [name], MD in 10 days   Get CBC, CMP,  checked  by Primary MD next visit.    Activity: As tolerated with Full fall precautions use walker/cane & assistance as needed   Disposition Home    Diet: Regular Diet, with feeding assistance and aspiration precautions.  For Heart failure patients - Check your Weight same time everyday, if you gain over 2 pounds, or you develop in leg swelling, experience more shortness of breath or chest pain, call your Primary MD immediately. Follow Cardiac Low Salt Diet and 1.5 lit/day  fluid restriction.   On your next visit with your primary care physician please Get Medicines reviewed and adjusted.   Please request your Prim.MD to go over all Hospital Tests and Procedure/Radiological results at the follow up, please get all Hospital records  sent to your Prim MD by signing hospital release before you go home.   If you experience worsening of your admission symptoms, develop shortness of breath, life threatening emergency, suicidal or homicidal thoughts you must seek medical attention immediately by calling 911 or calling your MD immediately  if symptoms less severe.  You Must read complete instructions/literature along with all the possible adverse reactions/side effects for all the Medicines you take and that have been prescribed to you. Take any new Medicines after you have completely understood and accpet all the possible adverse reactions/side effects.   Do not drive, operating heavy machinery, perform activities at heights, swimming or participation in water activities or provide baby sitting services if your were admitted for syncope or siezures until you have seen by Primary MD or a Neurologist and advised to do so again.  Do not drive when taking Pain medications.    Do not take more than prescribed Pain, Sleep and Anxiety Medications  Special Instructions: If you have smoked or chewed Tobacco  in the last 2 yrs please stop smoking, stop any regular Alcohol  and or any Recreational drug use.  Wear Seat belts while driving.   Please note  You were cared for by a hospitalist during your hospital stay. If you have any questions about your discharge medications or the care you received while you were in the hospital after you are discharged, you can call the unit and asked to speak with the hospitalist on call if the hospitalist that took care of you is not available. Once you are discharged, your primary care physician will handle any further medical issues. Please note that NO REFILLS for any discharge medications will be authorized once you are discharged, as it is imperative that you return to your primary care physician (or establish a relationship with a primary care physician if you do not have one) for your aftercare  needs so that they can reassess your need for medications and monitor your lab values.   Increase activity slowly   Complete by: As directed       Allergies as of 08/12/2022       Reactions   Ceftriaxone Anaphylaxis, Swelling, Other (See Comments)   *ROCEPHIN* - "Blew up like a balloon"   Hydroxyzine Shortness Of Breath, Other (See Comments)   Pt states med make her light headed, get sob sxs   Other Shortness Of Breath, Other (See Comments)   Unnamed antibiotic for an ear infection- Extreme dizziness, also   Doxycycline Nausea And Vomiting   Lexapro [escitalopram] Other (See Comments)   Pt states med make her dizzy        Medication List     TAKE these medications    ACCRUFeR 30 MG Caps Generic drug: Ferric Maltol TAKE 1 CAPSULE BY MOUTH EVERY DAY IN THE MORNING AND AT BEDTIME What changed: See the new instructions.   albuterol 108 (90 Base) MCG/ACT inhaler Commonly known as: Ventolin HFA Inhale 2 puffs into the lungs every 4 (four) hours as needed for wheezing or shortness of breath.   ALPRAZolam 1 MG tablet Commonly known as: XANAX TAKE 1 TABLET BY MOUTH 3 TIMES DAILY AS NEEDED FOR ANXIETY.   ascorbic acid 500 MG tablet  Commonly known as: VITAMIN C Take 500 mg by mouth daily.   aspirin EC 325 MG tablet Take 325 mg by mouth in the morning.   atorvastatin 10 MG tablet Commonly known as: LIPITOR TAKE 1 TABLET BY MOUTH EVERY DAY   Atrovent HFA 17 MCG/ACT inhaler Generic drug: ipratropium Inhale 2 puffs into the lungs every 4 (four) hours as needed for wheezing.   butalbital-acetaminophen-caffeine 50-325-40 MG tablet Commonly known as: FIORICET Take 1 tablet by mouth every 4 (four) hours as needed for headache.   cyanocobalamin 1000 MCG tablet Commonly known as: VITAMIN B12 TAKE 1 TABLET BY MOUTH EVERY DAY   denosumab 60 MG/ML Sosy injection Commonly known as: [name] Inject 60 mg into the skin every 6 (six) months.   diclofenac Sodium 1 % Gel Commonly  known as: VOLTAREN APPLY 2 GRAMS TO AFFECTED AREA 4 TIMES A DAY What changed: See the new instructions.   Enulose 10 GM/15ML Soln Generic drug: lactulose (encephalopathy) Take 45 mLs (30 g total) by mouth 3 (three) times daily as needed (constipation). What changed:  when to take this reasons to take this   furosemide 40 MG tablet Commonly known as: LASIX Take 1 tablet (40 mg total) by mouth 2 (two) times daily.   ipratropium-albuterol 0.5-2.5 (3) MG/3ML Soln Commonly known as: DUONEB Inhale 3 mLs into the lungs every 4 (four) hours as needed. Nebulize 3 ml's and inhale into the lungs one to two times a day   Klor-Con M20 20 MEQ tablet Generic drug: potassium chloride SA TAKE 1 TABLET BY MOUTH EVERY DAY What changed: how much to take   levocetirizine 5 MG tablet Commonly known as: XYZAL TAKE 1 TABLET BY MOUTH EVERY DAY IN THE EVENING What changed:  how much to take how to take this   Linzess 72 MCG capsule Generic drug: linaclotide TAKE 1 CAPSULE BY MOUTH EVERY DAY BEFORE BREAKFAST What changed: See the new instructions.   Livalo 2 MG Tabs Generic drug: Pitavastatin Calcium TAKE 1 TABLET BY MOUTH EVERY DAY What changed:  how much to take when to take this   meclizine 25 MG tablet Commonly known as: ANTIVERT Take 12.5-25 mg by mouth 3 (three) times daily as needed for dizziness.   mometasone-formoterol 200-5 MCG/ACT Aero Commonly known as: DULERA Inhale 2 puffs into the lungs 2 (two) times daily.   montelukast 10 MG tablet Commonly known as: SINGULAIR TAKE 1 TABLET BY MOUTH EVERYDAY AT BEDTIME What changed: See the new instructions.   nortriptyline 25 MG capsule Commonly known as: PAMELOR TAKE 2 CAPSULES BY MOUTH AT BEDTIME   Omega 3 1200 MG Caps Take 1,200 mg by mouth every morning.   OXYGEN Inhale 3 L/min into the lungs See admin instructions. 3 L/min at bedtime and during the day as needed for shortness of breath   pantoprazole 40 MG  tablet Commonly known as: PROTONIX TAKE 1 TABLET BY MOUTH EVERY DAY What changed: when to take this   polyvinyl alcohol 1.4 % ophthalmic solution Commonly known as: LIQUIFILM TEARS Place 1 drop into both eyes 3 (three) times daily as needed for dry eyes.   predniSONE 10 MG (21) Tbpk tablet Commonly known as: STERAPRED UNI-PAK 21 TAB Please use per package instruction   senna-docusate 8.6-50 MG tablet Commonly known as: Senokot-S Take 1 tablet by mouth 2 (two) times daily between meals as needed for moderate constipation.   Vitamin D-3 25 MCG (1000 UT) Caps Take 1,000 Units by mouth daily.  Durable Medical Equipment  (From admission, onward)           Start     Ordered   08/10/22 1639  For home use only DME 4 wheeled rolling walker with seat  Once       Question:  Patient needs a walker to treat with the following condition  Answer:  Abnormal gait   08/10/22 1639            Allergies  Allergen Reactions   Ceftriaxone Anaphylaxis, Swelling and Other (See Comments)    *ROCEPHIN* - "Blew up like a balloon"   Hydroxyzine Shortness Of Breath and Other (See Comments)    Pt states med make her light headed, get sob sxs   Other Shortness Of Breath and Other (See Comments)    Unnamed antibiotic for an ear infection- Extreme dizziness, also   Doxycycline Nausea And Vomiting   Lexapro [Escitalopram] Other (See Comments)    Pt states med make her dizzy    Consultations: none   Procedures/Studies: CT CHEST ABDOMEN PELVIS W CONTRAST  Result Date: 08/08/2022 CLINICAL DATA:  Weight loss, unintended EXAM: CT CHEST, ABDOMEN, AND PELVIS WITH CONTRAST TECHNIQUE: Multidetector CT imaging of the chest, abdomen and pelvis was performed following the standard protocol during bolus administration of intravenous contrast. RADIATION DOSE REDUCTION: This exam was performed according to the departmental dose-optimization program which includes automated exposure  control, adjustment of the mA and/or kV according to patient size and/or use of iterative reconstruction technique. CONTRAST:  24m OMNIPAQUE IOHEXOL 350 MG/ML SOLN COMPARISON:  CT chest 04/27/2022, CT chest 01/01/2015 FINDINGS: CT CHEST FINDINGS Cardiovascular: Normal heart size. Query interatrial connection. No significant pericardial effusion. The thoracic aorta is normal in caliber. Moderate to severe atherosclerotic plaque of the thoracic aorta. Four-vessel coronary artery calcifications. Aortic valve leaflet calcifications. Mediastinum/Nodes: No enlarged mediastinal, hilar, or axillary lymph nodes. Thyroid gland, trachea, and esophagus demonstrate no significant findings. Lungs/Pleura: Mild centrilobular emphysematous changes. Chronic similar-appearing right masslike apical consolidation with air bronchograms. Redemonstration of anterior right upper lobe radiation fibrosis. Stable chronic left lower lobe subpleural 5 x 4 mm pulmonary nodule. No new pulmonary nodule. Similar scattered mild cylindrical bronchiectasis with trace patchy tree-in-bud nodularity. No pulmonary mass. No pleural effusion. No pneumothorax. Musculoskeletal: No chest wall abnormality. No suspicious lytic or blastic osseous lesions. No acute displaced fracture. Old healed left rib fractures. Multilevel degenerative changes of the spine. CT ABDOMEN PELVIS FINDINGS Hepatobiliary: The hepatic parenchyma is diffusely hypodense compared to the splenic parenchyma consistent with fatty infiltration. No focal liver abnormality. Status post cholecystectomy no biliary dilatation. Pancreas: No focal lesion. Normal pancreatic contour. No surrounding inflammatory changes. No main pancreatic ductal dilatation. Spleen: Normal in size without focal abnormality. Adrenals/Urinary Tract: No adrenal nodule bilaterally. Bilateral kidneys enhance symmetrically. Hypodense lesions likely represent simple renal cysts. Simple renal cysts, in the absence of  clinically indicated signs/symptoms, require no independent follow-up. No hydronephrosis. No hydroureter. The urinary bladder is unremarkable. On delayed imaging, there is no urothelial wall thickening and there are no filling defects in the opacified portions of the bilateral collecting systems or ureters. Stomach/Bowel: Partial colectomy. Stomach is within normal limits. No evidence of bowel wall thickening or dilatation. Under distension of the rectum. Stool throughout the colon. Vascular/Lymphatic: No abdominal aorta or iliac aneurysm. Severe atherosclerotic plaque of the aorta and its branches. No abdominal, pelvic, or inguinal lymphadenopathy. Reproductive: Uterus and bilateral adnexa are unremarkable. Other: No intraperitoneal free fluid. No intraperitoneal free gas. No organized fluid  collection. Musculoskeletal: Moderate volume infraumbilical ventral hernia containing mesentery and a long loop of small bowel. No suspicious lytic or blastic osseous lesions. No acute displaced fracture. Chronic stable L1 and L3 compression fractures. Similar-appearing grade 1 anterolisthesis of L4 on L5 and L5 on S1. Multilevel degenerative changes of the spine. Degenerative changes of the right hip. IMPRESSION: 1. Similar scattered mild cylindrical bronchiectasis with trace patchy tree-in-bud nodularity. 2. Moderate volume infraumbilical ventral hernia containing mesentery and a long loop of small bowel. No associated findings suggest bowel obstruction or ischemia. 3. Increased stool burden throughout the colon-correlate clinically for constipation. 4. Aortic Atherosclerosis (ICD10-I70.0) including aortic valve leaflet calcifications and coronary artery calcification. Correlate with aortic stenosis. 5. Emphysema (ICD10-J43.9). Electronically Signed   By: Iven Finn M.D.   On: 08/08/2022 01:29   CT HEAD WO CONTRAST (5MM)  Result Date: 08/07/2022 CLINICAL DATA:  NECK TRAUMA.  LETHARGY. EXAM: CT HEAD WITHOUT CONTRAST  CT CERVICAL SPINE WITHOUT CONTRAST TECHNIQUE: Multidetector CT imaging of the head and cervical spine was performed following the standard protocol without intravenous contrast. Multiplanar CT image reconstructions of the cervical spine were also generated. RADIATION DOSE REDUCTION: This exam was performed according to the departmental dose-optimization program which includes automated exposure control, adjustment of the mA and/or kV according to patient size and/or use of iterative reconstruction technique. COMPARISON:  MRI brain 04/09/2022 FINDINGS: CT HEAD FINDINGS Brain: No evidence of acute infarction, hemorrhage, hydrocephalus, extra-axial collection or mass lesion/mass effect. Vascular: No hyperdense vessel or unexpected calcification. Skull: Normal. Negative for fracture or focal lesion. Sinuses/Orbits: Paranasal sinuses are clear. Partial opacification of the right mastoid air cells. Other: Exam detail is diminished due to motion artifact. Additionally, streak artifact from patient's bilateral earrings and dental amalgam diminishes detail at the level of the posterior fossa. CT CERVICAL SPINE FINDINGS Alignment: Examination is mildly motion limited. Anterolisthesis of C5 on C6 measures 5 mm. No signs of acute post-traumatic mild alignment of the cervical spine. Skull base and vertebrae: No acute fracture. No primary bone lesion or focal pathologic process. Soft tissues and spinal canal: No prevertebral fluid or swelling. No visible canal hematoma. Disc levels: There is marked disc space narrowing and endplate spurring identified C6-7. Moderate disc space narrowing with vacuum disc is noted at C5-6. Bilateral facet arthropathy noted. Upper chest: Fibrosis and masslike architectural distortion is identified within the right apex which appears unchanged from 04/27/2022. This area was previously characterized as likely representing sequelae of external beam radiation. No acute abnormality noted. Other: None  IMPRESSION: 1. No acute intracranial abnormality. 2. No evidence for cervical spine fracture or subluxation. 3. 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 4. Fibrosis and masslike architectural distortion is identified within the right apex which appears unchanged from 04/27/2022. This area was previously characterized as likely representing sequelae of external beam radiation. Electronically Signed   By: Kerby Moors M.D.   On: 08/07/2022 17:02   CT Cervical Spine Wo Contrast  Result Date: 08/07/2022 CLINICAL DATA:  NECK TRAUMA.  LETHARGY. EXAM: CT HEAD WITHOUT CONTRAST CT CERVICAL SPINE WITHOUT CONTRAST TECHNIQUE: Multidetector CT imaging of the head and cervical spine was performed following the standard protocol without intravenous contrast. Multiplanar CT image reconstructions of the cervical spine were also generated. RADIATION DOSE REDUCTION: This exam was performed according to the departmental dose-optimization program which includes automated exposure control, adjustment of the mA and/or kV according to patient size and/or use of iterative reconstruction technique. COMPARISON:  MRI brain 04/09/2022 FINDINGS: CT HEAD  FINDINGS Brain: No evidence of acute infarction, hemorrhage, hydrocephalus, extra-axial collection or mass lesion/mass effect. Vascular: No hyperdense vessel or unexpected calcification. Skull: Normal. Negative for fracture or focal lesion. Sinuses/Orbits: Paranasal sinuses are clear. Partial opacification of the right mastoid air cells. Other: Exam detail is diminished due to motion artifact. Additionally, streak artifact from patient's bilateral earrings and dental amalgam diminishes detail at the level of the posterior fossa. CT CERVICAL SPINE FINDINGS Alignment: Examination is mildly motion limited. Anterolisthesis of C5 on C6 measures 5 mm. No signs of acute post-traumatic mild alignment of the cervical spine. Skull base and vertebrae: No acute fracture. No primary  bone lesion or focal pathologic process. Soft tissues and spinal canal: No prevertebral fluid or swelling. No visible canal hematoma. Disc levels: There is marked disc space narrowing and endplate spurring identified C6-7. Moderate disc space narrowing with vacuum disc is noted at C5-6. Bilateral facet arthropathy noted. Upper chest: Fibrosis and masslike architectural distortion is identified within the right apex which appears unchanged from 04/27/2022. This area was previously characterized as likely representing sequelae of external beam radiation. No acute abnormality noted. Other: None IMPRESSION: 1. No acute intracranial abnormality. 2. No evidence for cervical spine fracture or subluxation. 3. 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 4. Fibrosis and masslike architectural distortion is identified within the right apex which appears unchanged from 04/27/2022. This area was previously characterized as likely representing sequelae of external beam radiation. Electronically Signed   By: Kerby Moors M.D.   On: 08/07/2022 17:02   DG Pelvis 1-2 Views  Result Date: 08/07/2022 CLINICAL DATA:  Fall EXAM: PELVIS - 1-2 VIEW COMPARISON:  None Available. FINDINGS: There is no evidence of pelvic fracture or diastasis. No pelvic bone lesions are seen. Multilevel degenerate disc disease of the lower lumbar spine. IMPRESSION: Negative. Electronically Signed   By: Keane Police D.O.   On: 08/07/2022 16:39   DG Forearm Right  Result Date: 08/07/2022 CLINICAL DATA:  Fall, pain. EXAM: RIGHT FOREARM - 2 VIEW COMPARISON:  None Available. FINDINGS: There is no evidence of fracture or other focal bone lesions. Soft tissues are unremarkable. IMPRESSION: Negative. Electronically Signed   By: Keane Police D.O.   On: 08/07/2022 16:36   DG Chest 2 View  Result Date: 08/07/2022 CLINICAL DATA:  Failure to thrive, difficult to arouse EXAM: CHEST - 2 VIEW COMPARISON:  Chest x-ray April 09, 2022 FINDINGS: The  cardiomediastinal silhouette is unchanged in contour. Left basilar bandlike opacity. No pleural effusion or pneumothorax. Surgical clips in the right axilla and right upper quadrant. No acute osseous abnormality. IMPRESSION: Left basilar bandlike opacity, likely atelectasis. Electronically Signed   By: Beryle Flock M.D.   On: 08/07/2022 16:35   (Echo, Carotid, EGD, Colonoscopy, ERCP)    Subjective:  No significant events overnight, she denies any complaints today, eager to go home, no BiPAP use for last 24 hours Discharge Exam: Vitals:   08/12/22 0831 08/12/22 0834  BP:    Pulse:    Resp:    Temp:    SpO2: 100% 100%   Vitals:   08/12/22 0352 08/12/22 0500 08/12/22 0831 08/12/22 0834  BP: 122/62     Pulse: 72     Resp: 12     Temp: 98.5 F (36.9 C)     TempSrc: Oral     SpO2: 99%  100% 100%  Weight:  42.7 kg    Height:        General: Pt is alert, awake,  not in acute distress,frail Cardiovascular: RRR, S1/S2 +, no rubs, no gallops Respiratory: CTA bilaterally, no wheezing, no rhonchi Abdominal: Soft, NT, ND, bowel sounds + Extremities: no edema, no cyanosis    The results of significant diagnostics from this hospitalization (including imaging, microbiology, ancillary and laboratory) are listed below for reference.     Microbiology: Recent Results (from the past 240 hour(s))  Resp Panel by RT-PCR (Flu A&B, Covid) Anterior Nasal Swab     Status: None   Collection Time: 08/08/22  6:16 AM   Specimen: Anterior Nasal Swab  Result Value Ref Range Status   SARS Coronavirus 2 by RT PCR NEGATIVE NEGATIVE Final    Comment: (NOTE) SARS-CoV-2 target nucleic acids are NOT DETECTED.  The SARS-CoV-2 RNA is generally detectable in upper respiratory specimens during the acute phase of infection. The lowest concentration of SARS-CoV-2 viral copies this assay can detect is 138 copies/mL. A negative result does not preclude SARS-Cov-2 infection and should not be used as the sole  basis for treatment or other patient management decisions. A negative result may occur with  improper specimen collection/handling, submission of specimen other than nasopharyngeal swab, presence of viral mutation(s) within the areas targeted by this assay, and inadequate number of viral copies(<138 copies/mL). A negative result must be combined with clinical observations, patient history, and epidemiological information. The expected result is Negative.  Fact Sheet for Patients:  EntrepreneurPulse.com.au  Fact Sheet for Healthcare Providers:  IncredibleEmployment.be  This test is no t yet approved or cleared by the [place] FDA and  has been authorized for detection and/or diagnosis of SARS-CoV-2 by FDA under an Emergency Use Authorization (EUA). This EUA will remain  in effect (meaning this test can be used) for the duration of the COVID-19 declaration under Section 564(b)(1) of the Act, 21 U.S.C.section 360bbb-3(b)(1), unless the authorization is terminated  or revoked sooner.       Influenza A by PCR NEGATIVE NEGATIVE Final   Influenza B by PCR NEGATIVE NEGATIVE Final    Comment: (NOTE) The Xpert Xpress SARS-CoV-2/FLU/RSV plus assay is intended as an aid in the diagnosis of influenza from Nasopharyngeal swab specimens and should not be used as a sole basis for treatment. Nasal washings and aspirates are unacceptable for Xpert Xpress SARS-CoV-2/FLU/RSV testing.  Fact Sheet for Patients: EntrepreneurPulse.com.au  Fact Sheet for Healthcare Providers: IncredibleEmployment.be  This test is not yet approved or cleared by the [place] FDA and has been authorized for detection and/or diagnosis of SARS-CoV-2 by FDA under an Emergency Use Authorization (EUA). This EUA will remain in effect (meaning this test can be used) for the duration of the COVID-19 declaration under Section 564(b)(1) of the Act,  21 U.S.C. section 360bbb-3(b)(1), unless the authorization is terminated or revoked.  Performed at [facility], [place] [address], [place], [place] [address]   Respiratory (~20 pathogens) panel by PCR     Status: None   Collection Time: 08/08/22  8:15 AM   Specimen: Nasopharyngeal Swab; Respiratory  Result Value Ref Range Status   Adenovirus NOT DETECTED NOT DETECTED Final   Coronavirus 229E NOT DETECTED NOT DETECTED Final    Comment: (NOTE) The Coronavirus on the Respiratory Panel, DOES NOT test for the novel  Coronavirus (2019 nCoV)    Coronavirus HKU1 NOT DETECTED NOT DETECTED Final   Coronavirus NL63 NOT DETECTED NOT DETECTED Final   Coronavirus OC43 NOT DETECTED NOT DETECTED Final   Metapneumovirus NOT DETECTED NOT DETECTED Final   Rhinovirus / Enterovirus  NOT DETECTED NOT DETECTED Final   Influenza A NOT DETECTED NOT DETECTED Final   Influenza B NOT DETECTED NOT DETECTED Final   Parainfluenza Virus 1 NOT DETECTED NOT DETECTED Final   Parainfluenza Virus 2 NOT DETECTED NOT DETECTED Final   Parainfluenza Virus 3 NOT DETECTED NOT DETECTED Final   Parainfluenza Virus 4 NOT DETECTED NOT DETECTED Final   Respiratory Syncytial Virus NOT DETECTED NOT DETECTED Final   Bordetella pertussis NOT DETECTED NOT DETECTED Final   Bordetella Parapertussis NOT DETECTED NOT DETECTED Final   Chlamydophila pneumoniae NOT DETECTED NOT DETECTED Final   Mycoplasma pneumoniae NOT DETECTED NOT DETECTED Final    Comment: Performed at [facility], [place] [address] [place], [place] [address]     Labs: BNP (last 3 results) Recent Labs    08/07/22 1525  BNP 431.5*   Basic Metabolic Panel: Recent Labs  Lab 08/07/22 1525 08/08/22 0009 08/08/22 0444 08/08/22 0805 08/08/22 0857 08/10/22 0300 08/11/22 0357  NA 137   < > 134* 133* 134* 134* 134*  K 4.8   < > 4.1 5.2* 4.1 3.9 3.4*  CL 94*  --   --  93*  --  89* 89*  CO2 34*  --   --  32  --  35* 37*  GLUCOSE 83  --   --  97  --  74  90  BUN 10  --   --  9  --  14 12  CREATININE 0.71  --   --  0.74  --  0.76 0.80  CALCIUM 9.2  --   --  8.9  --  8.4* 8.5*   < > = values in this interval not displayed.   Liver Function Tests: Recent Labs  Lab 08/07/22 1525  AST 17  ALT 12  ALKPHOS 45  BILITOT 0.1*  PROT 6.3*  ALBUMIN 3.1*   No results for input(s): "LIPASE", "AMYLASE" in the last 168 hours. Recent Labs  Lab 08/08/22 0003  AMMONIA 34   CBC: Recent Labs  Lab 08/07/22 1525 08/08/22 0009 08/08/22 0054 08/08/22 0252 08/08/22 0444 08/08/22 0857  WBC 5.7  --   --   --   --   --   NEUTROABS 4.0  --   --   --   --   --   HGB 9.4* 10.2* 9.9* 10.2* 10.5* 11.9*  HCT 33.7* 30.0* 29.0* 30.0* 31.0* 35.0*  MCV 89.9  --   --   --   --   --   PLT 289  --   --   --   --   --    Cardiac Enzymes: No results for input(s): "CKTOTAL", "CKMB", "CKMBINDEX", "TROPONINI" in the last 168 hours. BNP: Invalid input(s): "POCBNP" CBG: Recent Labs  Lab 08/07/22 2249  GLUCAP 81   D-Dimer No results for input(s): "DDIMER" in the last 72 hours. Hgb A1c No results for input(s): "HGBA1C" in the last 72 hours. Lipid Profile No results for input(s): "CHOL", "HDL", "LDLCALC", "TRIG", "CHOLHDL", "LDLDIRECT" in the last 72 hours. Thyroid function studies No results for input(s): "TSH", "T4TOTAL", "T3FREE", "THYROIDAB" in the last 72 hours.  Invalid input(s): "FREET3" Anemia work up No results for input(s): "VITAMINB12", "FOLATE", "FERRITIN", "TIBC", "IRON", "RETICCTPCT" in the last 72 hours. Urinalysis    Component Value Date/Time   COLORURINE YELLOW 08/08/2022 0028   APPEARANCEUR CLEAR 08/08/2022 0028   LABSPEC 1.017 08/08/2022 0028   PHURINE 5.0 08/08/2022 0028   GLUCOSEU NEGATIVE 08/08/2022 0028  GLUCOSEU Negative 03/05/2022 1023   HGBUR NEGATIVE 08/08/2022 0028   BILIRUBINUR NEGATIVE 08/08/2022 0028   KETONESUR NEGATIVE 08/08/2022 0028   PROTEINUR NEGATIVE 08/08/2022 0028   UROBILINOGEN 0.2 mg/dL (A) 03/05/2022  1023   NITRITE NEGATIVE 08/08/2022 0028   LEUKOCYTESUR NEGATIVE 08/08/2022 0028   Sepsis Labs Recent Labs  Lab 08/07/22 1525  WBC 5.7   Microbiology Recent Results (from the past 240 hour(s))  Resp Panel by RT-PCR (Flu A&B, Covid) Anterior Nasal Swab     Status: None   Collection Time: 08/08/22  6:16 AM   Specimen: Anterior Nasal Swab  Result Value Ref Range Status   SARS Coronavirus 2 by RT PCR NEGATIVE NEGATIVE Final    Comment: (NOTE) SARS-CoV-2 target nucleic acids are NOT DETECTED.  The SARS-CoV-2 RNA is generally detectable in upper respiratory specimens during the acute phase of infection. The lowest concentration of SARS-CoV-2 viral copies this assay can detect is 138 copies/mL. A negative result does not preclude SARS-Cov-2 infection and should not be used as the sole basis for treatment or other patient management decisions. A negative result may occur with  improper specimen collection/handling, submission of specimen other than nasopharyngeal swab, presence of viral mutation(s) within the areas targeted by this assay, and inadequate number of viral copies(<138 copies/mL). A negative result must be combined with clinical observations, patient history, and epidemiological information. The expected result is Negative.  Fact Sheet for Patients:  EntrepreneurPulse.com.au  Fact Sheet for Healthcare Providers:  IncredibleEmployment.be  This test is no t yet approved or cleared by the [place] FDA and  has been authorized for detection and/or diagnosis of SARS-CoV-2 by FDA under an Emergency Use Authorization (EUA). This EUA will remain  in effect (meaning this test can be used) for the duration of the COVID-19 declaration under Section 564(b)(1) of the Act, 21 U.S.C.section 360bbb-3(b)(1), unless the authorization is terminated  or revoked sooner.       Influenza A by PCR NEGATIVE NEGATIVE Final   Influenza B by PCR  NEGATIVE NEGATIVE Final    Comment: (NOTE) The Xpert Xpress SARS-CoV-2/FLU/RSV plus assay is intended as an aid in the diagnosis of influenza from Nasopharyngeal swab specimens and should not be used as a sole basis for treatment. Nasal washings and aspirates are unacceptable for Xpert Xpress SARS-CoV-2/FLU/RSV testing.  Fact Sheet for Patients: EntrepreneurPulse.com.au  Fact Sheet for Healthcare Providers: IncredibleEmployment.be  This test is not yet approved or cleared by the [place] FDA and has been authorized for detection and/or diagnosis of SARS-CoV-2 by FDA under an Emergency Use Authorization (EUA). This EUA will remain in effect (meaning this test can be used) for the duration of the COVID-19 declaration under Section 564(b)(1) of the Act, 21 U.S.C. section 360bbb-3(b)(1), unless the authorization is terminated or revoked.  Performed at [facility], [place] [address], [place], [place] [address]   Respiratory (~20 pathogens) panel by PCR     Status: None   Collection Time: 08/08/22  8:15 AM   Specimen: Nasopharyngeal Swab; Respiratory  Result Value Ref Range Status   Adenovirus NOT DETECTED NOT DETECTED Final   Coronavirus 229E NOT DETECTED NOT DETECTED Final    Comment: (NOTE) The Coronavirus on the Respiratory Panel, DOES NOT test for the novel  Coronavirus (2019 nCoV)    Coronavirus HKU1 NOT DETECTED NOT DETECTED Final   Coronavirus NL63 NOT DETECTED NOT DETECTED Final   Coronavirus OC43 NOT DETECTED NOT DETECTED Final   Metapneumovirus NOT DETECTED NOT DETECTED  Final   Rhinovirus / Enterovirus NOT DETECTED NOT DETECTED Final   Influenza A NOT DETECTED NOT DETECTED Final   Influenza B NOT DETECTED NOT DETECTED Final   Parainfluenza Virus 1 NOT DETECTED NOT DETECTED Final   Parainfluenza Virus 2 NOT DETECTED NOT DETECTED Final   Parainfluenza Virus 3 NOT DETECTED NOT DETECTED Final   Parainfluenza Virus 4 NOT  DETECTED NOT DETECTED Final   Respiratory Syncytial Virus NOT DETECTED NOT DETECTED Final   Bordetella pertussis NOT DETECTED NOT DETECTED Final   Bordetella Parapertussis NOT DETECTED NOT DETECTED Final   Chlamydophila pneumoniae NOT DETECTED NOT DETECTED Final   Mycoplasma pneumoniae NOT DETECTED NOT DETECTED Final    Comment: Performed at [facility], [place] [address] [place], [place] [address]     Time coordinating discharge: Over 30 minutes  SIGNED:   Phillips Climes, MD  [facility] 08/12/2022, 12:57 PM Pager   If 7PM-7AM, please contact night-coverage [email or web address]

## 2022-08-12 NOTE — Progress Notes (Signed)
PT Cancellation Note  Patient Details Name: [name] MRN: [id number] DOB: [birth year]   Cancelled Treatment:    Reason Eval/Treat Not Completed: Other (comment). Pt dressed and ready to go home. No further questions or needs for me at this time.   Shary Decamp [facility] 08/12/2022, 2:55 PM [place] Office [phone]

## 2022-08-12 NOTE — Discharge Instructions (Signed)
Follow with Primary MD [name], MD in 10 days   Get CBC, CMP,  checked  by Primary MD next visit.    Activity: As tolerated with Full fall precautions use walker/cane & assistance as needed   Disposition Home    Diet: Regular Diet, with feeding assistance and aspiration precautions.  For Heart failure patients - Check your Weight same time everyday, if you gain over 2 pounds, or you develop in leg swelling, experience more shortness of breath or chest pain, call your Primary MD immediately. Follow Cardiac Low Salt Diet and 1.5 lit/day fluid restriction.   On your next visit with your primary care physician please Get Medicines reviewed and adjusted.   Please request your Prim.MD to go over all Hospital Tests and Procedure/Radiological results at the follow up, please get all Hospital records sent to your Prim MD by signing hospital release before you go home.   If you experience worsening of your admission symptoms, develop shortness of breath, life threatening emergency, suicidal or homicidal thoughts you must seek medical attention immediately by calling 911 or calling your MD immediately  if symptoms less severe.  You Must read complete instructions/literature along with all the possible adverse reactions/side effects for all the Medicines you take and that have been prescribed to you. Take any new Medicines after you have completely understood and accpet all the possible adverse reactions/side effects.   Do not drive, operating heavy machinery, perform activities at heights, swimming or participation in water activities or provide baby sitting services if your were admitted for syncope or siezures until you have seen by Primary MD or a Neurologist and advised to do so again.  Do not drive when taking Pain medications.    Do not take more than prescribed Pain, Sleep and Anxiety Medications  Special Instructions: If you have smoked or chewed Tobacco  in the last 2 yrs  please stop smoking, stop any regular Alcohol  and or any Recreational drug use.  Wear Seat belts while driving.   Please note  You were cared for by a hospitalist during your hospital stay. If you have any questions about your discharge medications or the care you received while you were in the hospital after you are discharged, you can call the unit and asked to speak with the hospitalist on call if the hospitalist that took care of you is not available. Once you are discharged, your primary care physician will handle any further medical issues. Please note that NO REFILLS for any discharge medications will be authorized once you are discharged, as it is imperative that you return to your primary care physician (or establish a relationship with a primary care physician if you do not have one) for your aftercare needs so that they can reassess your need for medications and monitor your lab values.

## 2022-08-12 NOTE — Progress Notes (Addendum)
Assessed patient for discharge readiness. Last Md note said plans to reassess if the patient remained off BiPap for 24 hours.  Will reassess later for d/c.

## 2022-08-13 ENCOUNTER — Telehealth: Payer: Self-pay | Admitting: *Deleted

## 2022-08-13 ENCOUNTER — Other Ambulatory Visit: Payer: Self-pay

## 2022-08-13 DIAGNOSIS — N1831 Chronic kidney disease, stage 3a: Secondary | ICD-10-CM

## 2022-08-13 DIAGNOSIS — J411 Mucopurulent chronic bronchitis: Secondary | ICD-10-CM

## 2022-08-13 DIAGNOSIS — I5032 Chronic diastolic (congestive) heart failure: Secondary | ICD-10-CM

## 2022-08-13 NOTE — Progress Notes (Signed)
  Care Coordination  Outreach Note  08/13/2022 Name: [name] MRN: [id number] DOB: 1945/11/02   Care Coordination Outreach Attempts: An unsuccessful telephone outreach was attempted today to offer the patient information about available care coordination services as a benefit of their health plan.   Referral received   Follow Up Plan:  Additional outreach attempts will be made to offer the patient care coordination information and services.   Encounter Outcome:  No Answer  Julian Hy, [place] Direct Dial: [phone]

## 2022-08-17 ENCOUNTER — Ambulatory Visit: Payer: Medicare Other

## 2022-08-17 ENCOUNTER — Telehealth: Payer: Self-pay | Admitting: *Deleted

## 2022-08-17 ENCOUNTER — Encounter: Payer: Self-pay | Admitting: *Deleted

## 2022-08-17 NOTE — Progress Notes (Signed)
  Care Coordination  Outreach Note  08/17/2022 Name: [name] MRN: [id number] DOB: 1946/04/09   Care Coordination Outreach Attempts: A second unsuccessful outreach was attempted today to offer the patient with information about available care coordination services as a benefit of their health plan.     Referral received   Follow Up Plan:  Additional outreach attempts will be made to offer the patient care coordination information and services.   Encounter Outcome:  No Answer  Julian Hy, [place] Direct Dial: [phone]

## 2022-08-17 NOTE — Patient Outreach (Signed)
  Care Coordination [facility] Note Transition Care Management Unsuccessful Follow-up Telephone Call  Date of discharge and from where:  Thursday, 08/12/22 [facility]; COPD exacerbation; acute on chronic respiratory failure/ CHF  Attempts:  1st Attempt  Reason for unsuccessful TCM follow-up call:  Left voice message  Oneta Rack, RN, BSN, CCRN Alumnus RN CM Care Coordination/ Transition of [place] Management [phone]: direct office

## 2022-08-18 ENCOUNTER — Encounter: Payer: Self-pay | Admitting: *Deleted

## 2022-08-18 ENCOUNTER — Telehealth: Payer: Self-pay | Admitting: *Deleted

## 2022-08-18 NOTE — Patient Outreach (Signed)
  Care Coordination [facility] Note Transition Care Management Unsuccessful Follow-up Telephone Call  Date of discharge and from where:  Thursday, 08/12/22 [facility]; COPD exacerbation/ acute on chronic respiratory failure  Attempts:  2nd Attempt  Reason for unsuccessful TCM follow-up call:  Left voice message- left voice messages on both numbers on file for patient requesting call back  Oneta Rack, RN, BSN, CCRN Alumnus RN CM Care Coordination/ Transition of [place] Management [phone]: direct office

## 2022-08-23 ENCOUNTER — Encounter: Payer: Self-pay | Admitting: *Deleted

## 2022-08-23 ENCOUNTER — Telehealth: Payer: Self-pay | Admitting: *Deleted

## 2022-08-23 NOTE — Progress Notes (Signed)
  Care Coordination  Outreach Note  08/23/2022 Name: SHANEKA EFAW MRN: [id number] DOB: [birth year]   Care Coordination Outreach Attempts: A third unsuccessful outreach was attempted today to offer the patient with information about available care coordination services as a benefit of their health plan.   Received referral   Follow Up Plan:  No further outreach attempts will be made at this time. We have been unable to contact the patient to offer or enroll patient in care coordination services  Encounter Outcome:  No Answer  Julian Hy, [place] Direct Dial: [phone]

## 2022-08-23 NOTE — Patient Outreach (Signed)
  Care Coordination [facility] Note Transition Care Management Unsuccessful Follow-up Telephone Call  Date of discharge and from where:  Thursday, 08/12/22, [facility]; COPD exacerbation, acute on chronic respiratory failure  Attempts:  3rd Attempt  Reason for unsuccessful TCM follow-up call:  Left voice message  Oneta Rack, RN, BSN, CCRN Alumnus RN CM Care Coordination/ Transition of [place] Management [phone]: direct office

## 2022-08-26 ENCOUNTER — Inpatient Hospital Stay: Payer: Medicare Other | Admitting: Pulmonary Disease

## 2022-08-27 ENCOUNTER — Ambulatory Visit (INDEPENDENT_AMBULATORY_CARE_PROVIDER_SITE_OTHER): Payer: Medicare Other | Admitting: Internal Medicine

## 2022-08-27 ENCOUNTER — Encounter (HOSPITAL_BASED_OUTPATIENT_CLINIC_OR_DEPARTMENT_OTHER): Payer: Self-pay | Admitting: Internal Medicine

## 2022-08-27 VITALS — BP 136/80 | HR 78 | Ht 62.0 in | Wt 104.0 lb

## 2022-08-27 DIAGNOSIS — I2510 Atherosclerotic heart disease of native coronary artery without angina pectoris: Secondary | ICD-10-CM | POA: Diagnosis not present

## 2022-08-27 DIAGNOSIS — E785 Hyperlipidemia, unspecified: Secondary | ICD-10-CM

## 2022-08-27 DIAGNOSIS — J9611 Chronic respiratory failure with hypoxia: Secondary | ICD-10-CM

## 2022-08-27 DIAGNOSIS — I5032 Chronic diastolic (congestive) heart failure: Secondary | ICD-10-CM

## 2022-08-27 DIAGNOSIS — I350 Nonrheumatic aortic (valve) stenosis: Secondary | ICD-10-CM | POA: Diagnosis not present

## 2022-08-27 NOTE — Progress Notes (Signed)
OFFICE NOTE  Chief Complaint:  Follow-up hospitalization  Primary Care Physician: [name], MD  HPI:  [name] a pleasant 76 year old female with a history of COPD and severe pulmonary hypertension. Recently she was put on home oxygen and has been on an oxygen concentrator, which has made a marked improvement in her ability to get around. She has previously seen Dr. Lamonte Sakai with pulmonology; however, that did not go too well and she has basically given up on any further treatments for COPD other than her oxygen. She still uses nebulizers as needed when she is tight, and I recommended Claritin for seasonal allergies today. As you know, she also has mild aortic stenosis and we are continuing to follow that. She is a low-risk stress test in November 2012. Unfortunately she was recently admitted for altered mental status, possible Tylenol overdose, respiratory failure and liver failure. All of which seems to have resolved. She seems to be doing pre-well after discharge.  She returns today for followup. She reports doing fairly well. She has seen Dr. Lake Bells in the interim and he is recommended continue current therapies. She is now seeing a different internist, Dr. Ronnald Ramp. He was recently admitted the hospital for pneumonia and treated accordingly. She had troponins which were negative suggesting they're likely is no significant underlying obstructive coronary disease. She reports she's recovered and is back to her base. She is overdue for repeat lipid profile.  I saw [name] back in the office today. Overall she reports she is doing fairly well. She's not been admitted to the hospital recently. She says she has stable shortness of breath. She denies any chest pain. It should be noted that she does have mild aortic stenosis which will last assessed in 2014.  [name] returns today for follow-up. She reports that she is doing fairly well. [name] she's not been hospitalized. She  has had an episode of COPD exacerbation which was supported with early antibiotics. She denies any worsening chest pain and has pretty stable shortness of breath on oxygen. She is able to ambulate and do most of her activities without any restrictions.  03/31/2016  [name] was seen back today in the office for follow-up. She seems to be doing fairly well although does use her oxygen almost continuously. Saturation was low today although she had heavy fingernail polish on and therefore the reading may not be accurate as it read 88%. She was on oxygen via a concentrator. Blood pressure was stable at 108/74. Weight is been stable. She recently had a repeat echocardiogram which shows stable mild aortic stenosis and normal LV function with some mild improvement in pulmonary pressures in May. She denies any chest pain. [name] that she's not been admitted to the hospital recently for any pneumonia but did have problems with recent gallbladder disease and had cholecystectomy.  12/17/2016  [name] returns today for follow-up. She's done well over the last year has not been hospitalized. She reports stable shortness of breath. She had mild aortic stenosis which is stable by echo last year and will need to be repeated in May of this year. Her last lipid profile showed total cholesterol 197, HDL C 78, triglycerides 100, an LDL-C 99. We'll need to repeat her lipid profile as well.  06/20/2017  I saw [name] back today for follow-up. She is doing well. We repeated on echo in 01/2017, this demonstrated normal LVEF 60-65%, mild stable aortic stenosis. She denies any worsening shortness of breath or chest  pain. Cholesterol remains elevated. Will plan to recheck that today. She could conceivably increase her Livalo. She reports having had her flu vaccine this year.  08/16/2017  [name] returns today for follow-up.  She seen specifically for oxygen testing.  We tested her room air saturation off of oxygen and it was noted to be  88% at rest.  We did not do ambulatory testing as she met criteria for oxygen therapy.  She is currently on an oxygen concentrator which we wish to renew.  She needs the oxygen concentrator as she is active and is mobile but requires continuous oxygen therapy -she is on 3 L continuous by nasal cannula.  12/09/2017  [name] returns today for follow-up.  She reports doing fairly well.  She is pleased that she finally got her oxygen concentrator.  Blood pressure is well-controlled today 122/72.  She denies chest pain or worsening shortness of breath.  Should her weight is been stable.  She had a recent lipid profile in December which showed LDL cholesterol of 81 and non-HDL cholesterol of 98.  She does have mild aortic valve stenosis which was assessed by echo last year.  07/03/2018   [name] returns today for follow-up.  Overall she feels like her breathing is done well.  She has not been hospitalized for pulmonary issues.  She denies chest pain or significant shortness of breath.  She continues to use oxygen.  She reports some improvement in her headaches.  Blood pressure remains soft but does not seem to be bothersome for her.  Her last lipid profile shows good control of her cholesterol.  She remains on Livalo.  Echo in 2018 showed mild aortic stenosis.  12/14/2019  [name] is seen today in follow-up.  She continues to be fairly stable from a cardiac standpoint.  She denies any chest pain or shortness of breath.  Her COPD seems to be stable on oxygen.  Blood pressure today is well controlled at 128/78.  Is now been a couple years since her last echo which did show some mild aortic stenosis.  She also has moderate coronary artery disease but does not seem to have had any issues with it.  EKG today shows sinus rhythm at 71. Her most recent lipids were TC 176, TG 92, HDL 76 and LDL 81.  08/26/2020  [name] returns today for follow-up.  So far her COPD seems to be fairly stable on oxygen.  She had a repeat echo  last April which showed stable LVEF of 55 to 60% with normal strain and grade 1 diastolic dysfunction.  Although there was mildly elevated pulmonary pressure the RV systolic function was normal.  There is mild to moderate aortic stenosis which seems fairly stable with mean gradient of 15 and peak gradient 29 mmHg.  Symptomatically she says she is about the same.  She continues to smoke some.  She had an issue with bowel impaction and now is on Linzess.  03/17/2021  Praise is seen today in follow-up.  Unfortunately in March she was hospitalized with what sounds like a pneumonia/sepsis picture.  She also had acute on chronic diastolic heart failure and mild troponin elevation which was chalked up to strain however cardiology was not involved in her visit.  She had a repeat echo which showed EF 55 to 10%, grade 2 diastolic dysfunction and now moderate aortic stenosis.  Her mean gradient has increased from 15 to 26.8 mmHg.  She denies any chest pain or worsening shortness of breath.  She  had cut back on her Lasix from 40 mg twice daily down to 40 mg daily because she "pees too much at night".  I felt that this might explain why she may be not feeling as well as she did prior to this hospitalization.  It also could be that she has had some progression of her lung disease.  01/11/2022  Icyss returns today for follow-up.  She reports some occasional shortness of breath but mostly attributes that to allergies.  She has followed with pulmonary last seen in October.  She just had a repeat echo which does show some worsening of her aortic valve disease.  Her mean gradient is 26.0 suggesting moderate to severe aortic stenosis, although suggesting worsening aortic stenosis, the echo in 2022 showed a mean gradient of 26.8 mmHg -  her DVI is 0.26.  She denies any worsening shortness of breath or chest pain.   08/27/2022  Allona is seen today in follow-up. She was recently hospitalized at [facility] for COPD exacerbation.   Her last echo was in August which showed moderate to severe aortic stenosis.  It was not felt that aortic stenosis was playing a pivotal role in her hospitalization.  She did respond to antibiotics and steroids as well as nebulizers.  She reports today that she is actually to statins.  When I was reviewing her medicines we noted that her PCP had added atorvastatin.  She has been maintained on Livalo.  I advised her to stop atorvastatin.  Blood pressure appears well-controlled today.  She has had some right hand edema, but she said she had her hand stuck in the bed and that led to swelling.  PMHx:  Past Medical History:  Diagnosis Date   Abnormal LFTs 08/02/2011   Acute liver failure 06/19/2013   Acute renal failure ([place]) 06/19/2013   Acute respiratory failure ([place]) 06/19/2013   Altered mental status 08/01/2011   Angina    Anxiety    Anxiety state, unspecified 12/03/2013   Aortic stenosis    mild   Arthritis    "hands" (02/03/2017)   Breast cancer, right breast ([place]) dx'd 2008   CAD (coronary artery disease) of artery bypass graft 11/06/2013   CAD (coronary artery disease), MI R/O 08/01/2011   PCI in 2006 (bare metal stent, unknown artery) - [place]    CAP (community acquired pneumonia) 09/11/2018   CHF,  acute diastolic, BNP 4k on admissio 08/01/2011   Chronic bronchitis ([place])    Chronic respiratory failure ([place]) 02/02/2012   Compression fracture of L1 lumbar vertebra ([place]) 02/02/2012   COPD (chronic obstructive pulmonary disease) ([place])    oxygen-dependent 4LPM [place]   Coronary artery disease 2006   2 stents w/previous MI   Depressed    Diastolic CHF ([place])    Dyslipidemia    Family history of adverse reaction to anesthesia    "daughter had c-section; missed twice w/epidural"   GERD (gastroesophageal reflux disease)    Heart murmur    History of blood transfusion    "w/my colon OR"   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   History of nuclear stress test 08/03/2011   attenuation at  apex - no perfusion defects    HTN (hypertension) 11/06/2013   Hyperkalemia, on ACE prior to admission 11/11/2011   Hypertension    Hyponatremia 01/31/2012   Migraine    "qod to q couple months since I was 21" (02/03/2017)   Mild aortic stenosis 08/01/2011   AVA 1.69 cm2 (06/02/2011)  Moderate to severe pulmonary hypertension (HCC)    NSTEMI (non-ST elevated myocardial infarction) ([place]) 2006   NSVT (nonsustained ventricular tachycardia) ([place])    h/o   On home oxygen therapy    "3L just at night; have it available prn duringtheday" (09/11/2018)   Pneumonia    "alot of times" (02/03/2017)   SBO (small bowel obstruction) ([place]) 04/23/2020   PARTIAL    Small bowel obstruction ([place]) 04/22/2020    Past Surgical History:  Procedure Laterality Date   BREAST BIOPSY Right 2008   BREAST LUMPECTOMY Right 2008   malignant   CATARACT EXTRACTION W/ INTRAOCULAR LENS  IMPLANT, BILATERAL Bilateral ~ 2013   [place]; 1971; [place]  11/2007   COLOSTOMY CLOSURE  07/2008   CORONARY ANGIOPLASTY WITH STENT PLACEMENT  2006   "2 stents"   ERCP N/A 09/05/2015   Procedure: ENDOSCOPIC RETROGRADE CHOLANGIOPANCREATOGRAPHY (ERCP);  Surgeon: Ladene Artist, MD;  Location: [facility];  Service: Endoscopy;  Laterality: N/A;   MYRINGOTOMY WITH TUBE PLACEMENT Right 04/11/2022   Procedure: TYMPANOSTOMY TUBE RIGHT EAR, EAR CLEANING AND REMOVAL OF FOREIGN BODIES;  Surgeon: Melissa Montane, MD;  Location: [facility];  Service: ENT;  Laterality: Right;   PARTIAL COLECTOMY  2009   for obstruction: temporary ostomy, later reversed.    RIGHT HEART CATHETERIZATION N/A 09/05/2013   Procedure: RIGHT HEART CATH;  Surgeon: Jolaine Artist, MD;  Location: [facility] CATH LAB;  Service: Cardiovascular;  Laterality: N/A;   TRANSTHORACIC ECHOCARDIOGRAM  11/12/2011   EF 14-78%, normal systolic function, grade 1 diastolic dysfunction; ventricular septal flattening (D-sign);  mild AS; trace-mild MR; LA mildly dilated; RV mod dilated; RA mod dilated; severe pulm HTN; elevated CVP    FAMHx:  Family History  Problem Relation Age of Onset   Alzheimer's disease Mother    Schizophrenia Sister    Breast cancer Sister        50s   Heart disease Sister    Diabetes Sister    Hyperlipidemia Brother    Cancer Neg Hx    Stroke Neg Hx    COPD Neg Hx    Depression Neg Hx    Drug abuse Neg Hx    Early death Neg Hx    Hypertension Neg Hx    Kidney disease Neg Hx     SOCHx:   reports that she has been smoking cigarettes. She has a 26.50 pack-year smoking history. She has never used smokeless tobacco. She reports current alcohol use of about 1.0 standard drink of alcohol per week. She reports that she does not use drugs.  ALLERGIES:  Allergies  Allergen Reactions   Ceftriaxone Anaphylaxis, Swelling and Other (See Comments)    *ROCEPHIN* - "Blew up like a balloon"   Hydroxyzine Shortness Of Breath and Other (See Comments)    Pt states med make her light headed, get sob sxs   Other Shortness Of Breath and Other (See Comments)    Unnamed antibiotic for an ear infection- Extreme dizziness, also   Doxycycline Nausea And Vomiting   Lexapro [Escitalopram] Other (See Comments)    Pt states med make her dizzy    ROS: Pertinent items noted in HPI and remainder of comprehensive ROS otherwise negative.  HOME MEDS: Current Outpatient Medications  Medication Sig Dispense Refill   ACCRUFER 30 MG CAPS TAKE 1 CAPSULE BY MOUTH EVERY DAY IN THE MORNING AND AT BEDTIME (Patient taking  differently: Take 30 mg by mouth 2 (two) times daily.) 180 capsule 0   albuterol (VENTOLIN HFA) 108 (90 Base) MCG/ACT inhaler Inhale 2 puffs into the lungs every 4 (four) hours as needed for wheezing or shortness of breath. 36 g 5   ALPRAZolam (XANAX) 1 MG tablet TAKE 1 TABLET BY MOUTH 3 TIMES DAILY AS NEEDED FOR ANXIETY. (Patient taking differently: Take 1 mg by mouth 3 (three) times daily as  needed for anxiety.) 90 tablet 3   aspirin EC 325 MG tablet Take 325 mg by mouth in the morning.     butalbital-acetaminophen-caffeine (FIORICET) 50-325-40 MG tablet Take 1 tablet by mouth every 4 (four) hours as needed for headache. 65 tablet 3   Cholecalciferol (VITAMIN D-3) 25 MCG (1000 UT) CAPS Take 1,000 Units by mouth daily.     cyanocobalamin (VITAMIN B12) 1000 MCG tablet TAKE 1 TABLET BY MOUTH EVERY DAY 90 tablet 1   denosumab ([name]) 60 MG/ML SOSY injection Inject 60 mg into the skin every 6 (six) months.     diclofenac Sodium (VOLTAREN) 1 % GEL APPLY 2 GRAMS TO AFFECTED AREA 4 TIMES A DAY (Patient taking differently: Apply 2 g topically 4 (four) times daily as needed (pain).) 300 g 1   furosemide (LASIX) 40 MG tablet Take 1 tablet (40 mg total) by mouth 2 (two) times daily. 180 tablet 0   ipratropium (ATROVENT HFA) 17 MCG/ACT inhaler Inhale 2 puffs into the lungs every 4 (four) hours as needed for wheezing. 38.7 g 1   ipratropium-albuterol (DUONEB) 0.5-2.5 (3) MG/3ML SOLN Inhale 3 mLs into the lungs every 4 (four) hours as needed. Nebulize 3 ml's and inhale into the lungs one to two times a day 360 mL 1   KLOR-CON M20 20 MEQ tablet TAKE 1 TABLET BY MOUTH EVERY DAY (Patient taking differently: Take 20 mEq by mouth daily.) 90 tablet 1   lactulose, encephalopathy, (ENULOSE) 10 GM/15ML SOLN Take 45 mLs (30 g total) by mouth 3 (three) times daily as needed (constipation). (Patient taking differently: Take 30 g by mouth at bedtime as needed (for constipation).) 1892 mL 0   levocetirizine (XYZAL) 5 MG tablet TAKE 1 TABLET BY MOUTH EVERY DAY IN THE EVENING (Patient taking differently: Take 5 mg by mouth every evening.) 90 tablet 1   LINZESS 72 MCG capsule TAKE 1 CAPSULE BY MOUTH EVERY DAY BEFORE BREAKFAST (Patient taking differently: Take 72 mcg by mouth daily before breakfast.) 90 capsule 1   LIVALO 2 MG TABS TAKE 1 TABLET BY MOUTH EVERY DAY (Patient taking differently: Take 2 mg by mouth daily at  12 noon.) 90 tablet 3   meclizine (ANTIVERT) 25 MG tablet Take 12.5-25 mg by mouth 3 (three) times daily as needed for dizziness.     mometasone-formoterol (DULERA) 200-5 MCG/ACT AERO Inhale 2 puffs into the lungs 2 (two) times daily. 1 each 6   montelukast (SINGULAIR) 10 MG tablet TAKE 1 TABLET BY MOUTH EVERYDAY AT BEDTIME (Patient taking differently: Take 10 mg by mouth at bedtime.) 90 tablet 1   nortriptyline (PAMELOR) 25 MG capsule TAKE 2 CAPSULES BY MOUTH AT BEDTIME (Patient taking differently: Take 50 mg by mouth at bedtime.) 180 capsule 0   Omega 3 1200 MG CAPS Take 1,200 mg by mouth every morning.      OXYGEN Inhale 3 L/min into the lungs See admin instructions. 3 L/min at bedtime and during the day as needed for shortness of breath     pantoprazole (PROTONIX) 40 MG  tablet TAKE 1 TABLET BY MOUTH EVERY DAY (Patient taking differently: Take 40 mg by mouth daily before breakfast.) 90 tablet 1   polyvinyl alcohol (LIQUIFILM TEARS) 1.4 % ophthalmic solution Place 1 drop into both eyes 3 (three) times daily as needed for dry eyes.     predniSONE (STERAPRED UNI-PAK 21 TAB) 10 MG (21) TBPK tablet Please use per package instruction 21 tablet 0   vitamin C (ASCORBIC ACID) 500 MG tablet Take 500 mg by mouth daily.     No current facility-administered medications for this visit.    LABS/IMAGING: No results found for this or any previous visit (from the past 48 hour(s)). No results found.  VITALS: BP 136/80 (BP Location: Left Arm, Patient Position: Sitting, Cuff Size: Normal)   Pulse 78   Ht _0  (1.575 m)   Wt 104 lb (47.2 kg)   BMI 19.02 kg/m   EXAM: General appearance: alert, no distress, pale, and on oxygen Neck: no carotid bruit, no JVD, and thyroid not enlarged, symmetric, no tenderness/mass/nodules Lungs: diminished breath sounds bilaterally and wheezes bilaterally Heart: regular rate and rhythm, S1, S2 normal, and systolic murmur: late systolic 3/6, crescendo at 2nd right  intercostal space Abdomen: soft, non-tender; bowel sounds normal; no masses,  no organomegaly Extremities: extremities normal, atraumatic, no cyanosis or edema Pulses: 2+ and symmetric Skin: Skin color, texture, turgor normal. No rashes or lesions Neurologic: Grossly normal Psych: Pleasant  EKG: Deferred  ASSESSMENT: Severe COPD with moderate pulmonary hypertension, on chronic oxygen (disabled d/t this) Moderate aortic stenosis mean gradient 30.7 mmHg (04/2022) Low risk nuclear stress testing in 2012 Dyslipidemia - on livalo   History of CAD with prior remote PCI  PLAN: 1.   Ms. Duma was recently hospitalized for COPD exacerbation but has improved somewhat.  It was not felt that there was a heart failure component to this.  Her last echo showed worsening aortic stenosis, but still moderate] here clear second heart sound on exam.  For some reason she is on 2 statin medicines.  I believe her PCP added atorvastatin.  She has done well on Livalo.  I would advise stopping the atorvastatin and if we need additional lipid lowering we could consider the higher dose of Livalo.  Otherwise we will plan to repeat echo in 6 months and survey her closely for aortic stenosis.  I reviewed the signs and symptoms of severe aortic stenosis with her and her daughter today in the office.  She is strongly counseled to avoid salt and continue with her diuretic.  Follow-up with me in 6 months or sooner as necessary.  Pixie Casino, MD, [facility], [place] Director of the Advanced Lipid Disorders &  Cardiovascular Risk Reduction Clinic Diplomate of the American Board of Clinical Lipidology  Attending Cardiologist  Direct Dial: [phone]  Fax: [phone]  Website:  [email or web address] 08/27/2022, 4:39 PM

## 2022-08-27 NOTE — Patient Instructions (Signed)
Medication Instructions:  Dr. Debara Pickett has stopped Atorvastatin (Lipitor)  *If you need a refill on your cardiac medications before your next appointment, please call your pharmacy*   Testing/Procedures: Your physician has requested that you have an echocardiogram. Echocardiography is a painless test that uses sound waves to create images of your heart. It provides your doctor with information about the size and shape of your heart and how well your heart's chambers and valves are working. This procedure takes approximately one hour. There are no restrictions for this procedure. Please do NOT wear cologne, perfume, aftershave, or lotions (deodorant is allowed). Please arrive 15 minutes prior to your appointment time. This is due late May/early June 2024   Follow-Up: At [facility], you and your health needs are our priority.  As part of our continuing mission to provide you with exceptional heart care, we have created designated Provider Care Teams.  These Care Teams include your primary Cardiologist (physician) and Advanced Practice Providers (APPs -  Physician Assistants and Nurse Practitioners) who all work together to provide you with the care you need, when you need it.  We recommend signing up for the patient portal called "MyChart".  Sign up information is provided on this After Visit Summary.  MyChart is used to connect with patients for Virtual Visits (Telemedicine).  Patients are able to view lab/test results, encounter notes, upcoming appointments, etc.  Non-urgent messages can be sent to your provider as well.   To learn more about what you can do with MyChart, go to [email or web address].    Your next appointment:    6 months with Dr. Debara Pickett -- after echo

## 2022-08-30 ENCOUNTER — Telehealth: Payer: Self-pay | Admitting: Internal Medicine

## 2022-08-30 NOTE — Telephone Encounter (Signed)
Returned call to patient, we did not locate her wallet.  Georgana Curio MHA RN CCM

## 2022-08-30 NOTE — Telephone Encounter (Signed)
Pt said she might eft her wallet at the front desk last Friday. She said its black Warden/ranger

## 2022-09-08 ENCOUNTER — Other Ambulatory Visit: Payer: Self-pay | Admitting: Internal Medicine

## 2022-09-08 DIAGNOSIS — J438 Other emphysema: Secondary | ICD-10-CM

## 2022-09-08 DIAGNOSIS — K5904 Chronic idiopathic constipation: Secondary | ICD-10-CM

## 2022-09-08 DIAGNOSIS — F419 Anxiety disorder, unspecified: Secondary | ICD-10-CM

## 2022-09-08 MED ORDER — LINACLOTIDE 72 MCG PO CAPS: 72.0000 ug | ORAL_CAPSULE | Freq: Every day | ORAL | 1 refills | Status: DC

## 2022-09-08 MED ORDER — IPRATROPIUM-ALBUTEROL 0.5-2.5 (3) MG/3ML IN SOLN: 3.0000 mL | RESPIRATORY_TRACT | 1 refills | Status: DC | PRN

## 2022-09-08 MED ORDER — ALPRAZOLAM 1 MG PO TABS: 1.0000 mg | ORAL_TABLET | Freq: Three times a day (TID) | ORAL | 3 refills | Status: DC | PRN

## 2022-09-08 NOTE — Telephone Encounter (Signed)
Kim stated that pt needed Linzess, Duoneb and Xanax.  Please advise on controlled. I have sent in the maintenance meds.

## 2022-09-08 NOTE — Telephone Encounter (Signed)
Patient was in the hospital and lost her purse - she needs all of her medications refilled - [facility] [address] [place]  [name] calledMaudie Hopkins - # [phone]  Please call

## 2022-09-15 ENCOUNTER — Other Ambulatory Visit: Payer: Self-pay | Admitting: Internal Medicine

## 2022-09-15 ENCOUNTER — Other Ambulatory Visit: Payer: Self-pay | Admitting: Pulmonary Disease

## 2022-09-15 DIAGNOSIS — J301 Allergic rhinitis due to pollen: Secondary | ICD-10-CM

## 2022-09-15 DIAGNOSIS — J9611 Chronic respiratory failure with hypoxia: Secondary | ICD-10-CM

## 2022-09-15 DIAGNOSIS — J438 Other emphysema: Secondary | ICD-10-CM

## 2022-09-15 DIAGNOSIS — F409 Phobic anxiety disorder, unspecified: Secondary | ICD-10-CM

## 2022-09-15 DIAGNOSIS — J449 Chronic obstructive pulmonary disease, unspecified: Secondary | ICD-10-CM

## 2022-09-15 DIAGNOSIS — M159 Polyosteoarthritis, unspecified: Secondary | ICD-10-CM

## 2022-09-26 ENCOUNTER — Inpatient Hospital Stay (HOSPITAL_COMMUNITY)
Admission: EM | Admit: 2022-09-26 | Discharge: 2022-09-30 | DRG: 193 | Disposition: A | Discharge status: Home / Self Care | Payer: Medicare Other | Attending: Internal Medicine | Admitting: Internal Medicine

## 2022-09-26 ENCOUNTER — Emergency Department (HOSPITAL_COMMUNITY): Payer: Medicare Other

## 2022-09-26 ENCOUNTER — Encounter (HOSPITAL_COMMUNITY): Payer: Self-pay

## 2022-09-26 ENCOUNTER — Inpatient Hospital Stay (HOSPITAL_COMMUNITY): Payer: Medicare Other

## 2022-09-26 ENCOUNTER — Other Ambulatory Visit: Payer: Self-pay

## 2022-09-26 DIAGNOSIS — I25810 Atherosclerosis of coronary artery bypass graft(s) without angina pectoris: Secondary | ICD-10-CM | POA: Diagnosis present

## 2022-09-26 DIAGNOSIS — J439 Emphysema, unspecified: Secondary | ICD-10-CM | POA: Diagnosis present

## 2022-09-26 DIAGNOSIS — Z955 Presence of coronary angioplasty implant and graft: Secondary | ICD-10-CM

## 2022-09-26 DIAGNOSIS — I350 Nonrheumatic aortic (valve) stenosis: Secondary | ICD-10-CM | POA: Diagnosis present

## 2022-09-26 DIAGNOSIS — Z9842 Cataract extraction status, left eye: Secondary | ICD-10-CM

## 2022-09-26 DIAGNOSIS — F17210 Nicotine dependence, cigarettes, uncomplicated: Secondary | ICD-10-CM | POA: Diagnosis present

## 2022-09-26 DIAGNOSIS — I5032 Chronic diastolic (congestive) heart failure: Secondary | ICD-10-CM | POA: Diagnosis present

## 2022-09-26 DIAGNOSIS — R0600 Dyspnea, unspecified: Principal | ICD-10-CM

## 2022-09-26 DIAGNOSIS — Z83438 Family history of other disorder of lipoprotein metabolism and other lipidemia: Secondary | ICD-10-CM

## 2022-09-26 DIAGNOSIS — J9621 Acute and chronic respiratory failure with hypoxia: Secondary | ICD-10-CM | POA: Diagnosis present

## 2022-09-26 DIAGNOSIS — R296 Repeated falls: Secondary | ICD-10-CM | POA: Diagnosis present

## 2022-09-26 DIAGNOSIS — Z8249 Family history of ischemic heart disease and other diseases of the circulatory system: Secondary | ICD-10-CM

## 2022-09-26 DIAGNOSIS — I2510 Atherosclerotic heart disease of native coronary artery without angina pectoris: Secondary | ICD-10-CM | POA: Diagnosis present

## 2022-09-26 DIAGNOSIS — R54 Age-related physical debility: Secondary | ICD-10-CM | POA: Diagnosis present

## 2022-09-26 DIAGNOSIS — Z79899 Other long term (current) drug therapy: Secondary | ICD-10-CM

## 2022-09-26 DIAGNOSIS — K6289 Other specified diseases of anus and rectum: Secondary | ICD-10-CM | POA: Diagnosis present

## 2022-09-26 DIAGNOSIS — Z9841 Cataract extraction status, right eye: Secondary | ICD-10-CM

## 2022-09-26 DIAGNOSIS — R609 Edema, unspecified: Secondary | ICD-10-CM | POA: Diagnosis not present

## 2022-09-26 DIAGNOSIS — Z881 Allergy status to other antibiotic agents status: Secondary | ICD-10-CM

## 2022-09-26 DIAGNOSIS — Z961 Presence of intraocular lens: Secondary | ICD-10-CM | POA: Diagnosis present

## 2022-09-26 DIAGNOSIS — K5900 Constipation, unspecified: Secondary | ICD-10-CM | POA: Diagnosis present

## 2022-09-26 DIAGNOSIS — M810 Age-related osteoporosis without current pathological fracture: Secondary | ICD-10-CM | POA: Diagnosis present

## 2022-09-26 DIAGNOSIS — D649 Anemia, unspecified: Secondary | ICD-10-CM | POA: Diagnosis present

## 2022-09-26 DIAGNOSIS — G9341 Metabolic encephalopathy: Secondary | ICD-10-CM | POA: Diagnosis present

## 2022-09-26 DIAGNOSIS — N179 Acute kidney failure, unspecified: Secondary | ICD-10-CM | POA: Diagnosis present

## 2022-09-26 DIAGNOSIS — Z1152 Encounter for screening for COVID-19: Secondary | ICD-10-CM

## 2022-09-26 DIAGNOSIS — K219 Gastro-esophageal reflux disease without esophagitis: Secondary | ICD-10-CM | POA: Diagnosis present

## 2022-09-26 DIAGNOSIS — E785 Hyperlipidemia, unspecified: Secondary | ICD-10-CM | POA: Diagnosis present

## 2022-09-26 DIAGNOSIS — J189 Pneumonia, unspecified organism: Secondary | ICD-10-CM | POA: Diagnosis present

## 2022-09-26 DIAGNOSIS — Z803 Family history of malignant neoplasm of breast: Secondary | ICD-10-CM

## 2022-09-26 DIAGNOSIS — J9622 Acute and chronic respiratory failure with hypercapnia: Secondary | ICD-10-CM | POA: Diagnosis present

## 2022-09-26 DIAGNOSIS — Z853 Personal history of malignant neoplasm of breast: Secondary | ICD-10-CM

## 2022-09-26 DIAGNOSIS — Z9981 Dependence on supplemental oxygen: Secondary | ICD-10-CM

## 2022-09-26 DIAGNOSIS — I110 Hypertensive heart disease with heart failure: Secondary | ICD-10-CM | POA: Diagnosis present

## 2022-09-26 DIAGNOSIS — Z933 Colostomy status: Secondary | ICD-10-CM

## 2022-09-26 DIAGNOSIS — Z888 Allergy status to other drugs, medicaments and biological substances status: Secondary | ICD-10-CM

## 2022-09-26 DIAGNOSIS — I252 Old myocardial infarction: Secondary | ICD-10-CM

## 2022-09-26 DIAGNOSIS — Z9049 Acquired absence of other specified parts of digestive tract: Secondary | ICD-10-CM

## 2022-09-26 DIAGNOSIS — Z7982 Long term (current) use of aspirin: Secondary | ICD-10-CM

## 2022-09-26 LAB — I-STAT VENOUS BLOOD GAS, ED
Acid-Base Excess: 8 mmol/L — ABNORMAL HIGH (ref 0.0–2.0)
Bicarbonate: 36.6 mmol/L — ABNORMAL HIGH (ref 20.0–28.0)
Calcium, Ion: 1.18 mmol/L (ref 1.15–1.40)
HCT: 35 % — ABNORMAL LOW (ref 36.0–46.0)
Hemoglobin: 11.9 g/dL — ABNORMAL LOW (ref 12.0–15.0)
O2 Saturation: 25 %
Patient temperature: 37
Potassium: 4.1 mmol/L (ref 3.5–5.1)
Sodium: 138 mmol/L (ref 135–145)
TCO2: 39 mmol/L — ABNORMAL HIGH (ref 22–32)
pCO2, Ven: 75.9 mmHg (ref 44–60)
pH, Ven: 7.291 (ref 7.25–7.43)
pO2, Ven: 20 mmHg — CL (ref 32–45)

## 2022-09-26 LAB — RESP PANEL BY RT-PCR (RSV, FLU A&B, COVID)  RVPGX2
Influenza A by PCR: NEGATIVE
Influenza B by PCR: NEGATIVE
Resp Syncytial Virus by PCR: NEGATIVE
SARS Coronavirus 2 by RT PCR: NEGATIVE

## 2022-09-26 LAB — CBC WITH DIFFERENTIAL/PLATELET
Abs Immature Granulocytes: 0.04 10*3/uL (ref 0.00–0.07)
Basophils Absolute: 0 10*3/uL (ref 0.0–0.1)
Basophils Relative: 0 %
Eosinophils Absolute: 0 10*3/uL (ref 0.0–0.5)
Eosinophils Relative: 0 %
HCT: 36.4 % (ref 36.0–46.0)
Hemoglobin: 10.6 g/dL — ABNORMAL LOW (ref 12.0–15.0)
Immature Granulocytes: 0 %
Lymphocytes Relative: 6 %
Lymphs Abs: 0.6 10*3/uL — ABNORMAL LOW (ref 0.7–4.0)
MCH: 28.5 pg (ref 26.0–34.0)
MCHC: 29.1 g/dL — ABNORMAL LOW (ref 30.0–36.0)
MCV: 97.8 fL (ref 80.0–100.0)
Monocytes Absolute: 0.8 10*3/uL (ref 0.1–1.0)
Monocytes Relative: 8 %
Neutro Abs: 8 10*3/uL — ABNORMAL HIGH (ref 1.7–7.7)
Neutrophils Relative %: 86 %
Platelets: 227 10*3/uL (ref 150–400)
RBC: 3.72 MIL/uL — ABNORMAL LOW (ref 3.87–5.11)
RDW: 20.8 % — ABNORMAL HIGH (ref 11.5–15.5)
WBC: 9.4 10*3/uL (ref 4.0–10.5)
nRBC: 0 % (ref 0.0–0.2)

## 2022-09-26 LAB — BASIC METABOLIC PANEL
Anion gap: 6 (ref 5–15)
BUN: 15 mg/dL (ref 8–23)
CO2: 33 mmol/L — ABNORMAL HIGH (ref 22–32)
Calcium: 8.5 mg/dL — ABNORMAL LOW (ref 8.9–10.3)
Chloride: 99 mmol/L (ref 98–111)
Creatinine, Ser: 1 mg/dL (ref 0.44–1.00)
GFR, Estimated: 58 mL/min — ABNORMAL LOW (ref >60)
Glucose, Bld: 105 mg/dL — ABNORMAL HIGH (ref 70–99)
Potassium: 4.1 mmol/L (ref 3.5–5.1)
Sodium: 138 mmol/L (ref 135–145)

## 2022-09-26 LAB — RESPIRATORY PANEL BY PCR

## 2022-09-26 LAB — STREP PNEUMONIAE URINARY ANTIGEN: Strep Pneumo Urinary Antigen: NEGATIVE

## 2022-09-26 LAB — GLUCOSE, CAPILLARY
Glucose-Capillary: 69 mg/dL — ABNORMAL LOW (ref 70–99)
Glucose-Capillary: 69 mg/dL — ABNORMAL LOW (ref 70–99)
Glucose-Capillary: 140 mg/dL — ABNORMAL HIGH (ref 70–99)
Glucose-Capillary: 62 mg/dL — ABNORMAL LOW (ref 70–99)
Glucose-Capillary: 81 mg/dL (ref 70–99)

## 2022-09-26 LAB — LACTIC ACID, PLASMA
Lactic Acid, Venous: 1 mmol/L (ref 0.5–1.9)
Lactic Acid, Venous: 0.7 mmol/L (ref 0.5–1.9)

## 2022-09-26 LAB — MRSA NEXT GEN BY PCR, NASAL: MRSA by PCR Next Gen: NOT DETECTED

## 2022-09-26 LAB — PROCALCITONIN: Procalcitonin: 0.15 ng/mL

## 2022-09-26 LAB — EXPECTORATED SPUTUM ASSESSMENT W GRAM STAIN, RFLX TO RESP C

## 2022-09-26 LAB — BRAIN NATRIURETIC PEPTIDE: B Natriuretic Peptide: 162.5 pg/mL — ABNORMAL HIGH (ref 0.0–100.0)

## 2022-09-26 MED ORDER — LORATADINE 10 MG PO TABS
10.0000 mg | ORAL_TABLET | Freq: Every evening | ORAL | Status: DC
  Administered 2022-09-26 – 2022-09-29 (×4): 10 mg via ORAL
  Filled 2022-09-26 (×4): qty 1

## 2022-09-26 MED ORDER — CHLORHEXIDINE GLUCONATE CLOTH 2 % EX PADS
6.0000 | MEDICATED_PAD | Freq: Every day | CUTANEOUS | Status: DC
  Administered 2022-09-26 – 2022-09-28 (×3): 6 via TOPICAL

## 2022-09-26 MED ORDER — NORTRIPTYLINE HCL 25 MG PO CAPS
50.0000 mg | ORAL_CAPSULE | Freq: Every day | ORAL | Status: DC
  Administered 2022-09-26 – 2022-09-29 (×4): 50 mg via ORAL
  Filled 2022-09-26 (×4): qty 2

## 2022-09-26 MED ORDER — LEVOCETIRIZINE DIHYDROCHLORIDE 5 MG PO TABS: 5.0000 mg | ORAL_TABLET | Freq: Every evening | ORAL | Status: DC

## 2022-09-26 MED ORDER — ASPIRIN 325 MG PO TBEC
325.0000 mg | DELAYED_RELEASE_TABLET | Freq: Every morning | ORAL | Status: DC
  Administered 2022-09-26 – 2022-09-30 (×5): 325 mg via ORAL
  Filled 2022-09-26 (×5): qty 1

## 2022-09-26 MED ORDER — BUTALBITAL-APAP-CAFFEINE 50-325-40 MG PO TABS: 1.0000 | ORAL_TABLET | ORAL | Status: DC | PRN

## 2022-09-26 MED ORDER — ARFORMOTEROL TARTRATE 15 MCG/2ML IN NEBU
15.0000 ug | INHALATION_SOLUTION | Freq: Two times a day (BID) | RESPIRATORY_TRACT | Status: DC
  Administered 2022-09-26 – 2022-09-30 (×8): 15 ug via RESPIRATORY_TRACT
  Filled 2022-09-26 (×8): qty 2

## 2022-09-26 MED ORDER — VITAMIN C 500 MG PO TABS
500.0000 mg | ORAL_TABLET | Freq: Every day | ORAL | Status: DC
  Administered 2022-09-26 – 2022-09-30 (×5): 500 mg via ORAL
  Filled 2022-09-26 (×5): qty 1

## 2022-09-26 MED ORDER — DICLOFENAC SODIUM 1 % EX GEL
4.0000 g | Freq: Four times a day (QID) | CUTANEOUS | Status: DC
  Administered 2022-09-28 – 2022-09-30 (×9): 4 g via TOPICAL
  Filled 2022-09-26 (×2): qty 100

## 2022-09-26 MED ORDER — POLYETHYLENE GLYCOL 3350 17 G PO PACK: 17.0000 g | PACK | Freq: Every day | ORAL | Status: DC | PRN

## 2022-09-26 MED ORDER — ENOXAPARIN SODIUM 30 MG/0.3ML IJ SOSY
30.0000 mg | PREFILLED_SYRINGE | INTRAMUSCULAR | Status: DC
  Administered 2022-09-26 – 2022-09-27 (×2): 30 mg via SUBCUTANEOUS
  Filled 2022-09-26 (×2): qty 0.3

## 2022-09-26 MED ORDER — METHYLPREDNISOLONE SODIUM SUCC 125 MG IJ SOLR
125.0000 mg | Freq: Once | INTRAMUSCULAR | Status: AC
  Administered 2022-09-26: 125 mg via INTRAVENOUS
  Filled 2022-09-26: qty 2

## 2022-09-26 MED ORDER — SODIUM CHLORIDE 0.9 % IV SOLN
3.0000 g | Freq: Four times a day (QID) | INTRAVENOUS | Status: DC
  Administered 2022-09-26 – 2022-09-28 (×9): 3 g via INTRAVENOUS
  Filled 2022-09-26 (×9): qty 8

## 2022-09-26 MED ORDER — OMEGA 3 1200 MG PO CAPS: 1200.0000 mg | ORAL_CAPSULE | Freq: Every morning | ORAL | Status: DC

## 2022-09-26 MED ORDER — MONTELUKAST SODIUM 10 MG PO TABS
10.0000 mg | ORAL_TABLET | Freq: Every day | ORAL | Status: DC
  Administered 2022-09-26 – 2022-09-29 (×4): 10 mg via ORAL
  Filled 2022-09-26 (×4): qty 1

## 2022-09-26 MED ORDER — PRAVASTATIN SODIUM 10 MG PO TABS
20.0000 mg | ORAL_TABLET | Freq: Every day | ORAL | Status: DC
  Administered 2022-09-26 – 2022-09-29 (×4): 20 mg via ORAL
  Filled 2022-09-26 (×4): qty 2

## 2022-09-26 MED ORDER — LEVOFLOXACIN IN D5W 750 MG/150ML IV SOLN
750.0000 mg | Freq: Once | INTRAVENOUS | Status: AC
  Administered 2022-09-26: 750 mg via INTRAVENOUS
  Filled 2022-09-26: qty 150

## 2022-09-26 MED ORDER — PANTOPRAZOLE SODIUM 40 MG PO TBEC
40.0000 mg | DELAYED_RELEASE_TABLET | Freq: Every day | ORAL | Status: DC
  Administered 2022-09-27 – 2022-09-30 (×4): 40 mg via ORAL
  Filled 2022-09-26 (×4): qty 1

## 2022-09-26 MED ORDER — ALBUTEROL SULFATE (2.5 MG/3ML) 0.083% IN NEBU
10.0000 mg/h | INHALATION_SOLUTION | Freq: Once | RESPIRATORY_TRACT | Status: AC
  Administered 2022-09-26: 10 mg/h via RESPIRATORY_TRACT
  Filled 2022-09-26: qty 3

## 2022-09-26 MED ORDER — ALBUTEROL SULFATE (2.5 MG/3ML) 0.083% IN NEBU
5.0000 mg | INHALATION_SOLUTION | Freq: Once | RESPIRATORY_TRACT | Status: AC
  Administered 2022-09-26: 5 mg via RESPIRATORY_TRACT
  Filled 2022-09-26: qty 6

## 2022-09-26 MED ORDER — BUDESONIDE 0.5 MG/2ML IN SUSP
0.5000 mg | Freq: Two times a day (BID) | RESPIRATORY_TRACT | Status: DC
  Administered 2022-09-26 – 2022-09-30 (×8): 0.5 mg via RESPIRATORY_TRACT
  Filled 2022-09-26 (×8): qty 2

## 2022-09-26 MED ORDER — METHYLPREDNISOLONE SODIUM SUCC 125 MG IJ SOLR
80.0000 mg | Freq: Every day | INTRAMUSCULAR | Status: AC
  Administered 2022-09-27 – 2022-09-28 (×2): 80 mg via INTRAVENOUS
  Filled 2022-09-26 (×3): qty 2

## 2022-09-26 MED ORDER — ALPRAZOLAM 0.5 MG PO TABS
0.5000 mg | ORAL_TABLET | Freq: Three times a day (TID) | ORAL | Status: DC | PRN
  Administered 2022-09-26 – 2022-09-30 (×10): 0.5 mg via ORAL
  Filled 2022-09-26 (×10): qty 1

## 2022-09-26 MED ORDER — LACTATED RINGERS IV SOLN: INTRAVENOUS | Status: DC

## 2022-09-26 MED ORDER — SODIUM CHLORIDE 0.9 % IV SOLN
500.0000 mg | INTRAVENOUS | Status: DC
  Administered 2022-09-26 – 2022-09-29 (×4): 500 mg via INTRAVENOUS
  Filled 2022-09-26 (×4): qty 5

## 2022-09-26 MED ORDER — LINACLOTIDE 72 MCG PO CAPS
72.0000 ug | ORAL_CAPSULE | Freq: Every day | ORAL | Status: DC
  Administered 2022-09-27 – 2022-09-30 (×3): 72 ug via ORAL
  Filled 2022-09-26 (×5): qty 1

## 2022-09-26 MED ORDER — DOCUSATE SODIUM 100 MG PO CAPS
100.0000 mg | ORAL_CAPSULE | Freq: Two times a day (BID) | ORAL | Status: DC | PRN
  Administered 2022-09-26 – 2022-09-27 (×3): 100 mg via ORAL
  Filled 2022-09-26 (×3): qty 1

## 2022-09-26 MED ORDER — OMEGA-3-ACID ETHYL ESTERS 1 G PO CAPS
1.0000 g | ORAL_CAPSULE | Freq: Every day | ORAL | Status: DC
  Administered 2022-09-26 – 2022-09-30 (×5): 1 g via ORAL
  Filled 2022-09-26 (×5): qty 1

## 2022-09-26 MED ORDER — SODIUM CHLORIDE 0.9 % IV SOLN: INTRAVENOUS | Status: DC | PRN

## 2022-09-26 MED ORDER — REVEFENACIN 175 MCG/3ML IN SOLN
175.0000 ug | Freq: Every day | RESPIRATORY_TRACT | Status: DC
  Administered 2022-09-27 – 2022-09-29 (×3): 175 ug via RESPIRATORY_TRACT
  Filled 2022-09-26 (×4): qty 3

## 2022-09-26 MED ORDER — IPRATROPIUM BROMIDE 0.02 % IN SOLN
0.5000 mg | Freq: Once | RESPIRATORY_TRACT | Status: AC
  Administered 2022-09-26: 0.5 mg via RESPIRATORY_TRACT
  Filled 2022-09-26: qty 2.5

## 2022-09-26 MED ORDER — FLEET ENEMA 7-19 GM/118ML RE ENEM
1.0000 | ENEMA | Freq: Once | RECTAL | Status: AC | PRN
  Administered 2022-09-26: 1 via RECTAL
  Filled 2022-09-26: qty 1

## 2022-09-26 NOTE — ED Notes (Signed)
Pt tripoding at edge of bed with an oxygen saturation of 70% pt placed on nonrebreather over highflow increasing her oxygen level to 97%. Pt refusing to use bedpan. MD came in and said okay to patient using bedside commode. During attempt pt oxygen desaturated to 75% with the nonrebreather on.

## 2022-09-26 NOTE — ED Notes (Addendum)
Pt found to be at 68% on [place] at 6LPM with shallow breaths and tachypnea. Pt placed on HFNC with MD at bedside.

## 2022-09-26 NOTE — ED Notes (Signed)
Intensivist at bedside.

## 2022-09-26 NOTE — Progress Notes (Signed)
Lower extremity venous duplex has been completed.   Preliminary results in CV Proc.   [name] 09/26/2022 1:38 PM

## 2022-09-26 NOTE — ED Provider Notes (Signed)
[facility] EMERGENCY DEPARTMENT Provider Note  CSN: [id number] Arrival date & time: 09/26/22 8756  Chief Complaint(s) Fall  HPI [name] is a 76 y.o. female with PMH COPD on 4 L home O2, CAD status post DES placement, severe pulmonary hypertension, moderate to severe aortic stenosis who presents emergency department for evaluation of shortness of breath, cough, chills.  Patient reportedly had multiple frequent falls but is denying any falls with me today.  Patient does endorse worsening shortness of breath and cough with feeling of chills but denies chest pain, headache, abdominal pain, nausea, vomiting or other systemic symptoms.   Past Medical History Past Medical History:  Diagnosis Date   Abnormal LFTs 08/02/2011   Acute liver failure 06/19/2013   Acute renal failure ([place]) 06/19/2013   Acute respiratory failure ([place]) 06/19/2013   Altered mental status 08/01/2011   Angina    Anxiety    Anxiety state, unspecified 12/03/2013   Aortic stenosis    mild   Arthritis    "hands" (02/03/2017)   Breast cancer, right breast ([place]) dx'd 2008   CAD (coronary artery disease) of artery bypass graft 11/06/2013   CAD (coronary artery disease), MI R/O 08/01/2011   PCI in 2006 (bare metal stent, unknown artery) - [place]    CAP (community acquired pneumonia) 09/11/2018   CHF,  acute diastolic, BNP 4k on admissio 08/01/2011   Chronic bronchitis ([place])    Chronic respiratory failure ([place]) 02/02/2012   Compression fracture of L1 lumbar vertebra ([place]) 02/02/2012   COPD (chronic obstructive pulmonary disease) ([place])    oxygen-dependent 4LPM [place]   Coronary artery disease 2006   2 stents w/previous MI   Depressed    Diastolic CHF ([place])    Dyslipidemia    Family history of adverse reaction to anesthesia    "daughter had c-section; missed twice w/epidural"   GERD (gastroesophageal reflux disease)    Heart murmur    History of blood transfusion    "w/my colon OR"   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   History of nuclear stress test 08/03/2011   attenuation at apex - no perfusion defects    HTN (hypertension) 11/06/2013   Hyperkalemia, on ACE prior to admission 11/11/2011   Hypertension    Hyponatremia 01/31/2012   Migraine    "qod to q couple months since I was 21" (02/03/2017)   Mild aortic stenosis 08/01/2011   AVA 1.69 cm2 (06/02/2011)    Moderate to severe pulmonary hypertension (HCC)    NSTEMI (non-ST elevated myocardial infarction) ([place]) 2006   NSVT (nonsustained ventricular tachycardia) ([place])    h/o   On home oxygen therapy    "3L just at night; have it available prn duringtheday" (09/11/2018)   Pneumonia    "alot of times" (02/03/2017)   SBO (small bowel obstruction) ([place]) 04/23/2020   PARTIAL    Small bowel obstruction ([place]) 04/22/2020   Patient Active Problem List   Diagnosis Date Noted   Malnutrition of moderate degree 08/10/2022   Acute on chronic respiratory failure with hypoxia and hypercapnia ([place]) 08/08/2022   Lesion of skin of right ear 07/20/2022   Obstipation 04/27/2022   Normocytic anemia 04/27/2022   Abnormal CT of the chest 04/27/2022   Vertigo 04/10/2022   COPD (chronic obstructive pulmonary disease) ([place])    Hyperlipidemia LDL goal <70 01/04/2022   Primary osteoarthritis involving multiple joints 11/17/2021   Atherosclerosis of aorta ([place]) 01/16/2021   Vitamin B12 deficiency anemia due to intrinsic factor deficiency  01/15/2021   Iron deficiency 01/15/2021   Hyperparathyroidism ([place]) 11/04/2020   Chronic idiopathic constipation 05/06/2020   Migraine headache without aura    Chronic renal disease, stage 3, moderately decreased glomerular filtration rate (GFR) between 30-59 mL/min/1.73 square meter 03/20/2019   Chronic respiratory failure with hypoxia ([place]) 08/24/2017   Anxiety 08/24/2017   Insomnia due to anxiety and fear 08/22/2017   Oxygen dependent 08/16/2017   Other emphysema ([place]) 06/14/2014   Coronary artery  disease involving native coronary artery of native heart without angina pectoris 11/06/2013   HTN (hypertension) 11/06/2013   Breast cancer of upper-outer quadrant of right female breast ([place]) 08/21/2013   Osteoporosis 01/01/2013   Chronic diastolic CHF (congestive heart failure) ([place]) 01/31/2012   Home Medication(s) Prior to Admission medications   Medication Sig Start Date End Date Taking? Authorizing Provider  ACCRUFER 30 MG CAPS TAKE 1 CAPSULE BY MOUTH EVERY DAY IN THE MORNING AND AT BEDTIME Patient taking differently: Take 30 mg by mouth 2 (two) times daily. 07/29/22   [name], MD  albuterol (VENTOLIN HFA) 108 (90 Base) MCG/ACT inhaler Inhale 2 puffs into the lungs every 4 (four) hours as needed for wheezing or shortness of breath. 04/29/22   [name], MD  ALPRAZolam Duanne Moron) 1 MG tablet Take 1 tablet (1 mg total) by mouth 3 (three) times daily as needed for anxiety. 09/08/22   [name], MD  aspirin EC 325 MG tablet Take 325 mg by mouth in the morning.    [provider]  butalbital-acetaminophen-caffeine (FIORICET) [phone] MG tablet Take 1 tablet by mouth every 4 (four) hours as needed for headache. 06/12/22   [name], MD  Cholecalciferol (VITAMIN D-3) 25 MCG (1000 UT) CAPS Take 1,000 Units by mouth daily.    [provider]  cyanocobalamin (VITAMIN B12) 1000 MCG tablet TAKE 1 TABLET BY MOUTH EVERY DAY 06/27/22   [name], MD  denosumab ([name]) 60 MG/ML SOSY injection Inject 60 mg into the skin every 6 (six) months.    [provider]  diclofenac Sodium (VOLTAREN) 1 % GEL APPLY 2 GRAMS TO AFFECTED AREA 4 TIMES A DAY 09/15/22   [name], MD  furosemide (LASIX) 40 MG tablet Take 1 tablet (40 mg total) by mouth 2 (two) times daily. 05/12/22   [name], MD  ipratropium (ATROVENT HFA) 17 MCG/ACT inhaler INHALE 2 PUFFS INTO THE LUNGS EVERY 4 HOURS AS NEEDED FOR WHEEZE 09/15/22   [name], MD  ipratropium-albuterol  (DUONEB) 0.5-2.5 (3) MG/3ML SOLN Inhale 3 mLs into the lungs every 4 (four) hours as needed. Nebulize 3 ml's and inhale into the lungs one to two times a day 09/08/22   [name], MD  KLOR-CON M20 20 MEQ tablet TAKE 1 TABLET BY MOUTH EVERY DAY Patient taking differently: Take 20 mEq by mouth daily. 05/03/22   [name], MD  lactulose, encephalopathy, (ENULOSE) 10 GM/15ML SOLN Take 45 mLs (30 g total) by mouth 3 (three) times daily as needed (constipation). Patient taking differently: Take 30 g by mouth at bedtime as needed (for constipation). 01/26/22   [name], MD  levocetirizine (XYZAL) 5 MG tablet TAKE 1 TABLET BY MOUTH EVERY DAY IN THE EVENING Patient taking differently: Take 5 mg by mouth every evening. 07/22/22   [name], MD  linaclotide [facility]) 72 MCG capsule Take 1 capsule (72 mcg total) by mouth daily before breakfast. 09/08/22   [name],  MD  LIVALO 2 MG TABS TAKE 1 TABLET BY MOUTH EVERY DAY Patient taking differently: Take 2 mg by mouth daily at 12 noon. 02/17/22   Hilty, Nadean Corwin, MD  meclizine (ANTIVERT) 25 MG tablet Take 12.5-25 mg by mouth 3 (three) times daily as needed for dizziness.    [provider]  mometasone-formoterol (DULERA) 200-5 MCG/ACT AERO INHALE 2 PUFFS INTO THE LUNGS TWICE A DAY 09/16/22   Hunsucker, Bonna Gains, MD  montelukast (SINGULAIR) 10 MG tablet TAKE 1 TABLET BY MOUTH EVERYDAY AT BEDTIME 09/15/22   [name], MD  nortriptyline (PAMELOR) 25 MG capsule TAKE 2 CAPSULES BY MOUTH AT BEDTIME 09/15/22   [name], MD  Omega 3 1200 MG CAPS Take 1,200 mg by mouth every morning.     [provider]  OXYGEN Inhale 3 L/min into the lungs See admin instructions. 3 L/min at bedtime and during the day as needed for shortness of breath    [provider]  pantoprazole (PROTONIX) 40 MG tablet TAKE 1 TABLET BY MOUTH EVERY DAY Patient taking differently: Take 40 mg by mouth daily before breakfast. 04/17/22    [name], MD  polyvinyl alcohol (LIQUIFILM TEARS) 1.4 % ophthalmic solution Place 1 drop into both eyes 3 (three) times daily as needed for dry eyes.    [provider]  predniSONE (STERAPRED UNI-PAK 21 TAB) 10 MG (21) TBPK tablet Please use per package instruction 08/12/22   Elgergawy, Silver Huguenin, MD  vitamin C (ASCORBIC ACID) 500 MG tablet Take 500 mg by mouth daily.    [provider]                                                                                                                                    Past Surgical History Past Surgical History:  Procedure Laterality Date   BREAST BIOPSY Right 2008   BREAST LUMPECTOMY Right 2008   malignant   CATARACT EXTRACTION W/ INTRAOCULAR LENS  IMPLANT, BILATERAL Bilateral ~ 2013   [place]; 1971; [place]  11/2007   COLOSTOMY CLOSURE  07/2008   CORONARY ANGIOPLASTY WITH STENT PLACEMENT  2006   "2 stents"   ERCP N/A 09/05/2015   Procedure: ENDOSCOPIC RETROGRADE CHOLANGIOPANCREATOGRAPHY (ERCP);  Surgeon: Ladene Artist, MD;  Location: [facility];  Service: Endoscopy;  Laterality: N/A;   MYRINGOTOMY WITH TUBE PLACEMENT Right 04/11/2022   Procedure: TYMPANOSTOMY TUBE RIGHT EAR, EAR CLEANING AND REMOVAL OF FOREIGN BODIES;  Surgeon: Melissa Montane, MD;  Location: [facility];  Service: ENT;  Laterality: Right;   PARTIAL COLECTOMY  2009   for obstruction: temporary ostomy, later reversed.    RIGHT HEART CATHETERIZATION N/A 09/05/2013   Procedure: RIGHT HEART CATH;  Surgeon: Jolaine Artist, MD;  Location: [facility] CATH LAB;  Service: Cardiovascular;  Laterality: N/A;  TRANSTHORACIC ECHOCARDIOGRAM  11/12/2011   EF 12-19%, normal systolic function, grade 1 diastolic dysfunction; ventricular septal flattening (D-sign); mild AS; trace-mild MR; LA mildly dilated; RV mod dilated; RA mod dilated; severe pulm HTN; elevated CVP   Family History Family History   Problem Relation Age of Onset   Alzheimer's disease Mother    Schizophrenia Sister    Breast cancer Sister        22s   Heart disease Sister    Diabetes Sister    Hyperlipidemia Brother    Cancer Neg Hx    Stroke Neg Hx    COPD Neg Hx    Depression Neg Hx    Drug abuse Neg Hx    Early death Neg Hx    Hypertension Neg Hx    Kidney disease Neg Hx     Social History Social History   Tobacco Use   Smoking status: Every Day    Packs/day: 0.50    Years: 53.00    Total pack years: 26.50    Types: Cigarettes   Smokeless tobacco: Never  Vaping Use   Vaping Use: Never used  Substance Use Topics   Alcohol use: Yes    Alcohol/week: 1.0 standard drink of alcohol    Types: 1 Glasses of wine per week   Drug use: No   Allergies Ceftriaxone, Hydroxyzine, Other, Doxycycline, and Lexapro [escitalopram]  Review of Systems Review of Systems  Constitutional:  Positive for chills and fever.  Respiratory:  Positive for cough and shortness of breath.     Physical Exam Vital Signs  I have reviewed the triage vital signs BP (!) 106/51 (BP Location: Left Arm)   Pulse 77   Temp 98.5 F (36.9 C)   Resp 20   Ht '5\' 2"'$  (1.575 m)   Wt 54.4 kg   SpO2 93%   BMI 21.95 kg/m   Physical Exam Vitals and nursing note reviewed.  Constitutional:      General: She is not in acute distress.    Appearance: She is well-developed. She is ill-appearing.  HENT:     Head: Normocephalic and atraumatic.  Eyes:     Conjunctiva/sclera: Conjunctivae normal.  Cardiovascular:     Rate and Rhythm: Normal rate and regular rhythm.     Heart sounds: No murmur heard. Pulmonary:     Breath sounds: Wheezing and rales present.  Abdominal:     Palpations: Abdomen is soft.     Tenderness: There is no abdominal tenderness.  Musculoskeletal:        General: No swelling.     Cervical back: Neck supple.  Skin:    General: Skin is warm and dry.     Capillary Refill: Capillary refill takes less than 2  seconds.  Neurological:     Mental Status: She is alert.  Psychiatric:        Mood and Affect: Mood normal.     ED Results and Treatments Labs (all labs ordered are listed, but only abnormal results are displayed) Labs Reviewed  CBC WITH DIFFERENTIAL/PLATELET - Abnormal; Notable for the following components:      Result Value   RBC 3.72 (*)    Hemoglobin 10.6 (*)    MCHC 29.1 (*)    RDW 20.8 (*)    Neutro Abs 8.0 (*)    Lymphs Abs 0.6 (*)    All other components within normal limits  BASIC METABOLIC PANEL - Abnormal; Notable for the following components:   CO2 33 (*)  Glucose, Bld 105 (*)    Calcium 8.5 (*)    GFR, Estimated 58 (*)    All other components within normal limits  BRAIN NATRIURETIC PEPTIDE - Abnormal; Notable for the following components:   B Natriuretic Peptide 162.5 (*)    All other components within normal limits  I-STAT VENOUS BLOOD GAS, ED - Abnormal; Notable for the following components:   pCO2, Ven 75.9 (*)    pO2, Ven 20 (*)    Bicarbonate 36.6 (*)    TCO2 39 (*)    Acid-Base Excess 8.0 (*)    HCT 35.0 (*)    Hemoglobin 11.9 (*)    All other components within normal limits  RESP PANEL BY RT-PCR (RSV, FLU A&B, COVID)  RVPGX2                                                                                                                          Radiology DG Chest 2 View  Result Date: 09/26/2022 CLINICAL DATA:  Shortness of breath. EXAM: CHEST - 2 VIEW COMPARISON:  August 07, 2022 FINDINGS: The cardiac silhouette is mildly enlarged and unchanged in size. There is marked severity calcification of the aortic arch. Marked severity patchy infiltrate is seen within the right upper lobe. Mild right basilar atelectasis and/or infiltrate is also seen. There is a small right pleural effusion. No pneumothorax is identified. Radiopaque surgical clips are seen within the right upper quadrant. The visualized skeletal structures are unremarkable. IMPRESSION: 1.  Marked severity right upper lobe infiltrate. 2. Mild right basilar atelectasis and/or infiltrate. 3. Small right pleural effusion. Electronically Signed   By: Virgina Norfolk M.D.   On: 09/26/2022 04:24    Pertinent labs & imaging results that were available during my care of the patient were reviewed by me and considered in my medical decision making (see MDM for details).  Medications Ordered in ED Medications  methylPREDNISolone sodium succinate (SOLU-MEDROL) 125 mg/2 mL injection 125 mg (has no administration in time range)  albuterol (PROVENTIL) (2.5 MG/3ML) 0.083% nebulizer solution (has no administration in time range)  albuterol (PROVENTIL) (2.5 MG/3ML) 0.083% nebulizer solution 5 mg (5 mg Nebulization Given 09/26/22 0500)  ipratropium (ATROVENT) nebulizer solution 0.5 mg (0.5 mg Nebulization Given 09/26/22 0445)  Procedures .Critical Care  Performed by: Teressa Lower, MD Authorized by: Teressa Lower, MD   Critical care provider statement:    Critical care time (minutes):  30   Critical care was necessary to treat or prevent imminent or life-threatening deterioration of the following conditions:  Respiratory failure   Critical care was time spent personally by me on the following activities:  Development of treatment plan with patient or surrogate, discussions with consultants, evaluation of patient's response to treatment, examination of patient, ordering and review of laboratory studies, ordering and review of radiographic studies, ordering and performing treatments and interventions, pulse oximetry, re-evaluation of patient's condition and review of old charts   (including critical care time)  Medical Decision Making / ED Course   This patient presents to the ED for concern of cough, fever, chills, shortness of breath, this involves an  extensive number of treatment options, and is a complaint that carries with it a high risk of complications and morbidity.  The differential diagnosis includes pneumonia, COPD exacerbation, CHF exacerbation, PE, ACS  MDM: Patient seen emergency room for evaluation of multiple complaints described above.  Physical exam with no external signs of trauma, minimal air movement bilaterally with associated wheezes and rales.  Worse on the right.  Laboratory evaluation with no significant leukocytosis, hemoglobin 10.6, pH 7.29 with a pCO2 of 75.9 but a bicarb of 36.6 indicating at least some amount of chronic hypercarbia with compensation.  BNP elevated to 162.5 is elevated from previous.  Chest x-ray concerning for right upper lobe infiltrate and patient has a ceftriaxone allergy and thus Levaquin was given.  Patient received multiple DuoNebs and methylprednisolone and on reevaluation wheezing continues and thus continuous albuterol treatment was administered.  At time of signout, patient pending CT PE but patient will likely require hospital admission for IV antibiotics.   Additional history obtained:  -External records from outside source obtained and reviewed including: Chart review including previous notes, labs, imaging, consultation notes   Lab Tests: -I ordered, reviewed, and interpreted labs.   The pertinent results include:   Labs Reviewed  CBC WITH DIFFERENTIAL/PLATELET - Abnormal; Notable for the following components:      Result Value   RBC 3.72 (*)    Hemoglobin 10.6 (*)    MCHC 29.1 (*)    RDW 20.8 (*)    Neutro Abs 8.0 (*)    Lymphs Abs 0.6 (*)    All other components within normal limits  BASIC METABOLIC PANEL - Abnormal; Notable for the following components:   CO2 33 (*)    Glucose, Bld 105 (*)    Calcium 8.5 (*)    GFR, Estimated 58 (*)    All other components within normal limits  BRAIN NATRIURETIC PEPTIDE - Abnormal; Notable for the following components:   B Natriuretic  Peptide 162.5 (*)    All other components within normal limits  I-STAT VENOUS BLOOD GAS, ED - Abnormal; Notable for the following components:   pCO2, Ven 75.9 (*)    pO2, Ven 20 (*)    Bicarbonate 36.6 (*)    TCO2 39 (*)    Acid-Base Excess 8.0 (*)    HCT 35.0 (*)    Hemoglobin 11.9 (*)    All other components within normal limits  RESP PANEL BY RT-PCR (RSV, FLU A&B, COVID)  [id number]      Imaging Studies ordered: I ordered imaging studies including chest x-ray I independently visualized and interpreted imaging. I agree with the radiologist interpretation  CT PE pending   Medicines ordered and prescription drug management: Meds ordered this encounter  Medications   albuterol (PROVENTIL) (2.5 MG/3ML) 0.083% nebulizer solution 5 mg   ipratropium (ATROVENT) nebulizer solution 0.5 mg   methylPREDNISolone sodium succinate (SOLU-MEDROL) 125 mg/2 mL injection 125 mg   albuterol (PROVENTIL) (2.5 MG/3ML) 0.083% nebulizer solution    -I have reviewed the patients home medicines and have made adjustments as needed  Critical interventions Oxygen supplementation, multiple DuoNebs    Cardiac Monitoring: The patient was maintained on a cardiac monitor.  I personally viewed and interpreted the cardiac monitored which showed an underlying rhythm of: NSR  Social Determinants of Health:  Factors impacting patients care include: none   Reevaluation: After the interventions noted above, I reevaluated the patient and found that they have :improved  Co morbidities that complicate the patient evaluation  Past Medical History:  Diagnosis Date   Abnormal LFTs 08/02/2011   Acute liver failure 06/19/2013   Acute renal failure ([place]) 06/19/2013   Acute respiratory failure ([place]) 06/19/2013   Altered mental status 08/01/2011   Angina    Anxiety    Anxiety state, unspecified 12/03/2013   Aortic stenosis    mild   Arthritis    "hands" (02/03/2017)   Breast cancer, right breast ([place]) dx'd  2008   CAD (coronary artery disease) of artery bypass graft 11/06/2013   CAD (coronary artery disease), MI R/O 08/01/2011   PCI in 2006 (bare metal stent, unknown artery) - [place]    CAP (community acquired pneumonia) 09/11/2018   CHF,  acute diastolic, BNP 4k on admissio 08/01/2011   Chronic bronchitis ([place])    Chronic respiratory failure ([place]) 02/02/2012   Compression fracture of L1 lumbar vertebra ([place]) 02/02/2012   COPD (chronic obstructive pulmonary disease) ([place])    oxygen-dependent 4LPM [place]   Coronary artery disease 2006   2 stents w/previous MI   Depressed    Diastolic CHF ([place])    Dyslipidemia    Family history of adverse reaction to anesthesia    "daughter had c-section; missed twice w/epidural"   GERD (gastroesophageal reflux disease)    Heart murmur    History of blood transfusion    "w/my colon OR"   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   History of nuclear stress test 08/03/2011   attenuation at apex - no perfusion defects    HTN (hypertension) 11/06/2013   Hyperkalemia, on ACE prior to admission 11/11/2011   Hypertension    Hyponatremia 01/31/2012   Migraine    "qod to q couple months since I was 21" (02/03/2017)   Mild aortic stenosis 08/01/2011   AVA 1.69 cm2 (06/02/2011)    Moderate to severe pulmonary hypertension (HCC)    NSTEMI (non-ST elevated myocardial infarction) ([place]) 2006   NSVT (nonsustained ventricular tachycardia) ([place])    h/o   On home oxygen therapy    "3L just at night; have it available prn duringtheday" (09/11/2018)   Pneumonia    "alot of times" (02/03/2017)   SBO (small bowel obstruction) ([place]) 04/23/2020   PARTIAL    Small bowel obstruction ([place]) 04/22/2020      Dispostion: I considered admission for this patient, and disposition pending PE study but anticipate hospital admission for IV antibiotics     Final Clinical Impression(s) / ED Diagnoses Final diagnoses:  None     '@PCDICTATION'$ @    Teressa Lower, MD 09/26/22  [phone]

## 2022-09-26 NOTE — Progress Notes (Signed)
Pharmacy Antibiotic Note  [name] is a 76 y.o. female admitted on 09/26/2022 with pneumonia.  Pharmacy has been consulted for Unasyn dosing. Of note patient with ceftriaxone allergy, patient tolerated Augmentin in 2013. Low cross reactivity between agents, MD aware.   Plan: Unasyn 3g Q6H.  Follow culture data for de-escalation.  Monitor renal function for dose adjustments as indicated.    Height: '5\' 2"'$  (157.5 cm) Weight: 54.4 kg (120 lb) IBW/kg (Calculated) : 50.1  Temp (24hrs), Avg:98 F (36.7 C), Min:97.5 F (36.4 C), Max:98.5 F (36.9 C)  Recent Labs  Lab 09/26/22 0351  WBC 9.4  CREATININE 1.00    Estimated Creatinine Clearance: 37.9 mL/min (by C-G formula based on SCr of 1 mg/dL).    Allergies  Allergen Reactions   Ceftriaxone Anaphylaxis, Swelling and Other (See Comments)    *ROCEPHIN* - "Blew up like a balloon"   Hydroxyzine Shortness Of Breath and Other (See Comments)    Pt states med make her light headed, get sob sxs   Other Shortness Of Breath and Other (See Comments)    Unnamed antibiotic for an ear infection- Extreme dizziness, also   Doxycycline Nausea And Vomiting   Lexapro [Escitalopram] Other (See Comments)    Pt states med make her dizzy    Thank you for allowing pharmacy to be a part of this patient's care.  [name], PharmD, BCCCP  09/26/2022 9:54 AM

## 2022-09-26 NOTE — ED Provider Notes (Signed)
Patient seen after prior EDP.  Patient with significant FiO2 requirement.    Critical care made aware of case and will evaluate for admission given high FiO2 requirement.  Patient continues to adamantly refuse BiPAP or additional noninvasive ventilatory support.   Valarie Merino, MD 09/26/22 [phone]

## 2022-09-26 NOTE — ED Provider Triage Note (Signed)
Emergency Medicine Provider Triage Evaluation Note  [name] , a 76 y.o. female  was evaluated in triage.  Pt complains of SOB and cough with chest congestion x 2 days. States she uses 3L supplemental O2 at night or during the day when active. Has felt "hot and cold" without documented fever. Denies chest pain, N/V, known sick contacts. Daughter expressed concern about increased falls with EMS; patient states that her daughter "exaggerates" and she has not had issues with frequent falls today.  Review of Systems  Positive: As above Negative: As above  Physical Exam  BP (!) 101/53 (BP Location: Left Arm)   Pulse 79   Temp 98.5 F (36.9 C)   Resp 20   Ht '5\' 2"'$  (1.575 m)   Wt 54.4 kg   SpO2 91%   BMI 21.95 kg/m  Gen:   Awake, no distress   Resp:  Normal effort  MSK:   Moves extremities without difficulty  Other:  Expiratory wheezing in all lung fields. SpO2 91% on 4L via [place]. Heart RRR with SEM.  Medical Decision Making  Medically screening exam initiated at 3:37 AM.  Appropriate orders placed.  [name] was informed that the remainder of the evaluation will be completed by another provider, this initial triage assessment does not replace that evaluation, and the importance of remaining in the ED until their evaluation is complete.  URI symptoms and SOB x 2 days. Hx of COPD, pulmonary HTN, CHF. Work up initiated.   Antonietta Breach, PA-C 09/26/22 [phone]

## 2022-09-26 NOTE — ED Notes (Signed)
Walked in to patient room and found patient without oxygen connected to wall and patient oxygen saturation to be 37%. Pt placed on nonrebreather to increase oxygen level.

## 2022-09-26 NOTE — H&P (Signed)
NAME:  [name], MRN:  [id number], DOB:  [birth year], LOS: 0 ADMISSION DATE:  09/26/2022, CONSULTATION DATE:  09/26/22 REFERRING MD:  EDP, CHIEF COMPLAINT:  SOB   History of Present Illness:  76 yo F PMH breast ca, Emphysema, chronic hypoxic respiratory failure on 4L, diastolic HF, CAD, HLD, AS, presenting with dry cough, fatigue, chills.  Found to be severely hypoxemic and have acute on chronic hypercapniec respiratory failure.  Refusing BIPAP and having increasing agitation, hyoxemia so PCCM consulted.  Imaging showing RUL infiltrate.  Denies sick contacts.   Pertinent  Medical History   Past Medical History:  Diagnosis Date   Abnormal LFTs 08/02/2011   Acute liver failure 06/19/2013   Acute renal failure ([place]) 06/19/2013   Acute respiratory failure ([place]) 06/19/2013   Altered mental status 08/01/2011   Angina    Anxiety    Anxiety state, unspecified 12/03/2013   Aortic stenosis    mild   Arthritis    "hands" (02/03/2017)   Breast cancer, right breast ([place]) dx'd 2008   CAD (coronary artery disease) of artery bypass graft 11/06/2013   CAD (coronary artery disease), MI R/O 08/01/2011   PCI in 2006 (bare metal stent, unknown artery) - [place]    CAP (community acquired pneumonia) 09/11/2018   CHF,  acute diastolic, BNP 4k on admissio 08/01/2011   Chronic bronchitis ([place])    Chronic respiratory failure ([place]) 02/02/2012   Compression fracture of L1 lumbar vertebra ([place]) 02/02/2012   COPD (chronic obstructive pulmonary disease) ([place])    oxygen-dependent 4LPM [place]   Coronary artery disease 2006   2 stents w/previous MI   Depressed    Diastolic CHF ([place])    Dyslipidemia    Family history of adverse reaction to anesthesia    "daughter had c-section; missed twice w/epidural"   GERD (gastroesophageal reflux disease)    Heart murmur    History of blood transfusion    "w/my colon OR"   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   History of nuclear stress test 08/03/2011   attenuation  at apex - no perfusion defects    HTN (hypertension) 11/06/2013   Hyperkalemia, on ACE prior to admission 11/11/2011   Hypertension    Hyponatremia 01/31/2012   Migraine    "qod to q couple months since I was 21" (02/03/2017)   Mild aortic stenosis 08/01/2011   AVA 1.69 cm2 (06/02/2011)    Moderate to severe pulmonary hypertension (HCC)    NSTEMI (non-ST elevated myocardial infarction) ([place]) 2006   NSVT (nonsustained ventricular tachycardia) ([place])    h/o   On home oxygen therapy    "3L just at night; have it available prn duringtheday" (09/11/2018)   Pneumonia    "alot of times" (02/03/2017)   SBO (small bowel obstruction) ([place]) 04/23/2020   PARTIAL    Small bowel obstruction ([place]) 04/22/2020     Significant Hospital Events: Including procedures, antibiotic start and stop dates in addition to other pertinent events   09/26/22 ICU admit  Interim History / Subjective:  Admitting  Objective   Blood pressure 105/68, pulse 84, temperature (!) 97.5 F (36.4 C), temperature source Oral, resp. rate (!) 32, height '5\' 2"'$  (1.575 m), weight 54.4 kg, SpO2 100 %.        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 noted Moves ext to command Agitated RASS +1 Mildly confused but Aox3  CXR dense RUL infiltrate Developing leukocytosis with left shift Cr mildly elevated  Resolved Hospital Problem list   N/A  Assessment & Plan:  Acute on chronic hypoxemic respiratory failure Acute on chronic hypercapneic respiratory failure RUL CAP due to infectious organism Acute metabolic encephalopathy Mild AKI Chronic anemia Advanced  COPD Frail elderly Hx breast cancer Hx CAD HLD GERD Chronic benzo use  - Unasyn/azithromycin (ceftriaxone and very questionable anaphylactic reaction) - Sputum cx if able - Pct, urine strep/legionella - LABA/LAMA/ICS nebs - PTA xyzal, singulair, PPI - PTA xanax but lower dose - Currently on 15LPM desats with movement, confused, would admit to ICU as high risk for needing intubation - Other chronic meds as ordered/reconciled  Best Practice (right click and "Reselect all SmartList Selections" daily)   Diet/type: Regular consistency (see orders) DVT prophylaxis: LMWH GI prophylaxis: PPI Lines: N/A Foley:  N/A Code Status:  full code Last date of multidisciplinary goals of care discussion [pending]  Labs   CBC: Recent Labs  Lab 09/26/22 0351 09/26/22 0357  WBC 9.4  --   NEUTROABS 8.0*  --   HGB 10.6* 11.9*  HCT 36.4 35.0*  MCV 97.8  --   PLT 227  --     Basic Metabolic Panel: Recent Labs  Lab 09/26/22 0351 09/26/22 0357  NA 138 138  K 4.1 4.1  CL 99  --   CO2 33*  --   GLUCOSE 105*  --   BUN 15  --   CREATININE 1.00  --   CALCIUM 8.5*  --    GFR: Estimated Creatinine Clearance: 37.9 mL/min (by C-G formula based on SCr of 1 mg/dL). Recent Labs  Lab 09/26/22 0351  WBC 9.4    Liver Function Tests: No results for input(s): "AST", "ALT", "ALKPHOS", "BILITOT", "PROT", "ALBUMIN" in the last 168 hours. No results for input(s): "LIPASE", "AMYLASE" in the last 168 hours. No results for input(s): "AMMONIA" in the last 168 hours.  ABG    Component Value Date/Time   PHART 7.293 (L) 08/08/2022 0857   PCO2ART 79.2 ([place]) 08/08/2022 0857   PO2ART 89 08/08/2022 0857   HCO3 36.6 (H) 09/26/2022 0357   TCO2 39 (H) 09/26/2022 0357   O2SAT 25 09/26/2022 0357     Coagulation Profile: No results for input(s): "INR", "PROTIME" in the last 168 hours.  Cardiac Enzymes: No results for input(s): "CKTOTAL", "CKMB", "CKMBINDEX", "TROPONINI" in the last 168  hours.  HbA1C: Hgb A1c MFr Bld  Date/Time Value Ref Range Status  01/15/2021 02:31 PM 5.1 4.6 - 6.5 % Final    Comment:    Glycemic Control Guidelines for People with Diabetes:Non Diabetic:  <6%Goal of Therapy: <7%Additional Action Suggested:  >8%   10/16/2015 02:12 PM 5.3 4.6 - 6.5 % Final    Comment:    Glycemic Control Guidelines for People with Diabetes:Non Diabetic:  <6%Goal of Therapy: <7%Additional Action Suggested:  >8%     CBG: No results for input(s): "GLUCAP" in the last 168 hours.  Review of Systems:    Positive Symptoms in bold:  Constitutional fevers, chills, weight loss, fatigue, anorexia, malaise  Eyes decreased vision, double vision, eye irritation  Ears, Nose, Mouth, Throat sore throat, trouble swallowing, sinus  congestion  Cardiovascular chest pain, paroxysmal nocturnal dyspnea, lower ext edema, palpitations   Respiratory SOB, cough, DOE, hemoptysis, wheezing  Gastrointestinal nausea, vomiting, diarrhea  Genitourinary burning with urination, trouble urinating  Musculoskeletal joint aches, joint swelling, back pain  Integumentary  rashes, skin lesions  Neurological focal weakness, focal numbness, trouble speaking, headaches  Psychiatric depression, anxiety, confusion  Endocrine polyuria, polydipsia, cold intolerance, heat intolerance  Hematologic abnormal bruising, abnormal bleeding, unexplained nose bleeds  Allergic/Immunologic recurrent infections, hives, swollen lymph nodes     Past Medical History:  She,  has a past medical history of Abnormal LFTs (08/02/2011), Acute liver failure (06/19/2013), Acute renal failure ([place]) (06/19/2013), Acute respiratory failure ([place] ) (06/19/2013), Altered mental status (08/01/2011), Angina, Anxiety, Anxiety state, unspecified (12/03/2013), Aortic stenosis, Arthritis, Breast cancer, right breast ([place]) (dx'd 2008), CAD (coronary artery disease) of artery bypass graft (11/06/2013), CAD (coronary artery disease), MI R/O  (08/01/2011), CAP (community acquired pneumonia) (09/11/2018), CHF,  acute diastolic, BNP 4k on admissio (08/01/2011), Chronic bronchitis ([place]), Chronic respiratory failure ([place]) (02/02/2012), Compression fracture of L1 lumbar vertebra ([place]) (02/02/2012), COPD (chronic obstructive pulmonary disease) ([place]), Coronary artery disease (2006), Depressed, Diastolic CHF ([place]), Dyslipidemia, Family history of adverse reaction to anesthesia, GERD (gastroesophageal reflux disease), Heart murmur, History of blood transfusion, History of bowel infarction (06/23/2013), History of nuclear stress test (08/03/2011), HTN (hypertension) (11/06/2013), Hyperkalemia, on ACE prior to admission (11/11/2011), Hypertension, Hyponatremia (01/31/2012), Migraine, Mild aortic stenosis (08/01/2011), Moderate to severe pulmonary hypertension ([place]), NSTEMI (non-ST elevated myocardial infarction) ([place]) (2006), NSVT (nonsustained ventricular tachycardia) ([place]), On home oxygen therapy, Pneumonia, SBO (small bowel obstruction) ([place]) (04/23/2020), and Small bowel obstruction ([place]) (04/22/2020).   Surgical History:   Past Surgical History:  Procedure Laterality Date   BREAST BIOPSY Right 2008   BREAST LUMPECTOMY Right 2008   malignant   CATARACT EXTRACTION W/ INTRAOCULAR LENS  IMPLANT, BILATERAL Bilateral ~ 2013   [place]; 1971; [place]  11/2007   COLOSTOMY CLOSURE  07/2008   CORONARY ANGIOPLASTY WITH STENT PLACEMENT  2006   "2 stents"   ERCP N/A 09/05/2015   Procedure: ENDOSCOPIC RETROGRADE CHOLANGIOPANCREATOGRAPHY (ERCP);  Surgeon: Ladene Artist, MD;  Location: [facility];  Service: Endoscopy;  Laterality: N/A;   MYRINGOTOMY WITH TUBE PLACEMENT Right 04/11/2022   Procedure: TYMPANOSTOMY TUBE RIGHT EAR, EAR CLEANING AND REMOVAL OF FOREIGN BODIES;  Surgeon: Melissa Montane, MD;  Location: [facility];  Service: ENT;  Laterality: Right;   PARTIAL COLECTOMY  2009   for  obstruction: temporary ostomy, later reversed.    RIGHT HEART CATHETERIZATION N/A 09/05/2013   Procedure: RIGHT HEART CATH;  Surgeon: Jolaine Artist, MD;  Location: [facility] CATH LAB;  Service: Cardiovascular;  Laterality: N/A;   TRANSTHORACIC ECHOCARDIOGRAM  11/12/2011   EF 53-66%, normal systolic function, grade 1 diastolic dysfunction; ventricular septal flattening (D-sign); mild AS; trace-mild MR; LA mildly dilated; RV mod dilated; RA mod dilated; severe pulm HTN; elevated CVP     Social History:   reports that she has been smoking cigarettes. She has a 26.50 pack-year smoking history. She has never used smokeless tobacco. She reports current alcohol use of about 1.0 standard drink of alcohol per week. She reports that she does not use drugs.   Family History:  Her family history includes Alzheimer's disease in her mother; Breast cancer in her sister; Diabetes in her sister; Heart disease in her sister; Hyperlipidemia in her brother; Schizophrenia in  her sister. There is no history of Cancer, Stroke, COPD, Depression, Drug abuse, Early death, Hypertension, or Kidney disease.   Allergies Allergies  Allergen Reactions   Ceftriaxone Anaphylaxis, Swelling and Other (See Comments)    *ROCEPHIN* - "Blew up like a balloon"   Hydroxyzine Shortness Of Breath and Other (See Comments)    Pt states med make her light headed, get sob sxs   Other Shortness Of Breath and Other (See Comments)    Unnamed antibiotic for an ear infection- Extreme dizziness, also   Doxycycline Nausea And Vomiting   Lexapro [Escitalopram] Other (See Comments)    Pt states med make her dizzy     Home Medications  Prior to Admission medications   Medication Sig Start Date End Date Taking? Authorizing Provider  ACCRUFER 30 MG CAPS TAKE 1 CAPSULE BY MOUTH EVERY DAY IN THE MORNING AND AT BEDTIME Patient taking differently: Take 30 mg by mouth 2 (two) times daily. 07/29/22   [name], MD  albuterol (VENTOLIN HFA) 108 (90  Base) MCG/ACT inhaler Inhale 2 puffs into the lungs every 4 (four) hours as needed for wheezing or shortness of breath. 04/29/22   [name], MD  ALPRAZolam Duanne Moron) 1 MG tablet Take 1 tablet (1 mg total) by mouth 3 (three) times daily as needed for anxiety. 09/08/22   [name], MD  aspirin EC 325 MG tablet Take 325 mg by mouth in the morning.    [provider]  butalbital-acetaminophen-caffeine (FIORICET) [phone] MG tablet Take 1 tablet by mouth every 4 (four) hours as needed for headache. 06/12/22   [name], MD  Cholecalciferol (VITAMIN D-3) 25 MCG (1000 UT) CAPS Take 1,000 Units by mouth daily.    [provider]  cyanocobalamin (VITAMIN B12) 1000 MCG tablet TAKE 1 TABLET BY MOUTH EVERY DAY 06/27/22   [name], MD  denosumab ([name]) 60 MG/ML SOSY injection Inject 60 mg into the skin every 6 (six) months.    [provider]  diclofenac Sodium (VOLTAREN) 1 % GEL APPLY 2 GRAMS TO AFFECTED AREA 4 TIMES A DAY 09/15/22   [name], MD  furosemide (LASIX) 40 MG tablet Take 1 tablet (40 mg total) by mouth 2 (two) times daily. 05/12/22   [name], MD  ipratropium (ATROVENT HFA) 17 MCG/ACT inhaler INHALE 2 PUFFS INTO THE LUNGS EVERY 4 HOURS AS NEEDED FOR WHEEZE 09/15/22   [name], MD  ipratropium-albuterol (DUONEB) 0.5-2.5 (3) MG/3ML SOLN Inhale 3 mLs into the lungs every 4 (four) hours as needed. Nebulize 3 ml's and inhale into the lungs one to two times a day 09/08/22   [name], MD  KLOR-CON M20 20 MEQ tablet TAKE 1 TABLET BY MOUTH EVERY DAY Patient taking differently: Take 20 mEq by mouth daily. 05/03/22   [name], MD  lactulose, encephalopathy, (ENULOSE) 10 GM/15ML SOLN Take 45 mLs (30 g total) by mouth 3 (three) times daily as needed (constipation). Patient taking differently: Take 30 g by mouth at bedtime as needed (for constipation). 01/26/22   [name], MD  levocetirizine (XYZAL) 5 MG tablet TAKE 1 TABLET BY  MOUTH EVERY DAY IN THE EVENING Patient taking differently: Take 5 mg by mouth every evening. 07/22/22   [name], MD  linaclotide Rolan Lipa) 72 MCG capsule Take 1 capsule (72 mcg total) by mouth daily before breakfast. 09/08/22   [name], MD  LIVALO 2 MG TABS TAKE 1 TABLET  BY MOUTH EVERY DAY Patient taking differently: Take 2 mg by mouth daily at 12 noon. 02/17/22   Hilty, Nadean Corwin, MD  meclizine (ANTIVERT) 25 MG tablet Take 12.5-25 mg by mouth 3 (three) times daily as needed for dizziness.    [provider]  mometasone-formoterol (DULERA) 200-5 MCG/ACT AERO INHALE 2 PUFFS INTO THE LUNGS TWICE A DAY 09/16/22   Hunsucker, Bonna Gains, MD  montelukast (SINGULAIR) 10 MG tablet TAKE 1 TABLET BY MOUTH EVERYDAY AT BEDTIME 09/15/22   [name], MD  nortriptyline (PAMELOR) 25 MG capsule TAKE 2 CAPSULES BY MOUTH AT BEDTIME 09/15/22   [name], MD  Omega 3 1200 MG CAPS Take 1,200 mg by mouth every morning.     [provider]  OXYGEN Inhale 3 L/min into the lungs See admin instructions. 3 L/min at bedtime and during the day as needed for shortness of breath    [provider]  pantoprazole (PROTONIX) 40 MG tablet TAKE 1 TABLET BY MOUTH EVERY DAY Patient taking differently: Take 40 mg by mouth daily before breakfast. 04/17/22   [name], MD  polyvinyl alcohol (LIQUIFILM TEARS) 1.4 % ophthalmic solution Place 1 drop into both eyes 3 (three) times daily as needed for dry eyes.    [provider]  predniSONE (STERAPRED UNI-PAK 21 TAB) 10 MG (21) TBPK tablet Please use per package instruction 08/12/22   Elgergawy, Silver Huguenin, MD  vitamin C (ASCORBIC ACID) 500 MG tablet Take 500 mg by mouth daily.    [provider]     Critical care time: 31 mins independent of procedures

## 2022-09-26 NOTE — ED Notes (Signed)
Pt refused Resp Panel Nasal Swab

## 2022-09-26 NOTE — ED Triage Notes (Signed)
Pt arrived by EMS from home complaining of weakness, cough and frequent falls x2 days.  Pt had fallen out of bed and hit her face, no LOC. Pt is not on blood thinners.   Hx of COPD and CHF, still smokes.  Pt is on 4L [place] chronically   98 HR 106/60 94% 4L  20RR

## 2022-09-26 NOTE — Progress Notes (Signed)
eLink Physician-Brief Progress Note Patient Name: [name] DOB: 1945/10/18 MRN: [id number]   Date of Service  09/26/2022  HPI/Events of Note  Patient is requesting for a fleets enema.   Patient seen on commode  eICU Interventions  Fleets enema ordered     Intervention Category Intermediate Interventions: Other:  Judd Lien 09/26/2022, 9:21 PM

## 2022-09-27 ENCOUNTER — Inpatient Hospital Stay (HOSPITAL_COMMUNITY): Payer: Medicare Other

## 2022-09-27 DIAGNOSIS — R0600 Dyspnea, unspecified: Secondary | ICD-10-CM | POA: Diagnosis not present

## 2022-09-27 LAB — CBC
HCT: 39.7 % (ref 36.0–46.0)
Hemoglobin: 11.3 g/dL — ABNORMAL LOW (ref 12.0–15.0)
MCH: 28.1 pg (ref 26.0–34.0)
MCHC: 28.5 g/dL — ABNORMAL LOW (ref 30.0–36.0)
MCV: 98.8 fL (ref 80.0–100.0)
Platelets: 198 10*3/uL (ref 150–400)
RBC: 4.02 MIL/uL (ref 3.87–5.11)
RDW: 19.9 % — ABNORMAL HIGH (ref 11.5–15.5)
WBC: 13 10*3/uL — ABNORMAL HIGH (ref 4.0–10.5)
nRBC: 0 % (ref 0.0–0.2)

## 2022-09-27 LAB — BLOOD GAS, ARTERIAL
Acid-Base Excess: 7.6 mmol/L — ABNORMAL HIGH (ref 0.0–2.0)
Bicarbonate: 38.2 mmol/L — ABNORMAL HIGH (ref 20.0–28.0)
Drawn by: 30136
O2 Saturation: 97.1 %
Patient temperature: 36.6
pCO2 arterial: 85 mmHg (ref 32–48)
pH, Arterial: 7.26 — ABNORMAL LOW (ref 7.35–7.45)
pO2, Arterial: 76 mmHg — ABNORMAL LOW (ref 83–108)

## 2022-09-27 LAB — BASIC METABOLIC PANEL
Anion gap: 8 (ref 5–15)
BUN: 12 mg/dL (ref 8–23)
CO2: 34 mmol/L — ABNORMAL HIGH (ref 22–32)
Calcium: 8.4 mg/dL — ABNORMAL LOW (ref 8.9–10.3)
Chloride: 95 mmol/L — ABNORMAL LOW (ref 98–111)
Creatinine, Ser: 0.78 mg/dL (ref 0.44–1.00)
GFR, Estimated: >60 mL/min (ref >60)
Glucose, Bld: 103 mg/dL — ABNORMAL HIGH (ref 70–99)
Potassium: 4.3 mmol/L (ref 3.5–5.1)
Sodium: 137 mmol/L (ref 135–145)

## 2022-09-27 LAB — MAGNESIUM: Magnesium: 2.2 mg/dL (ref 1.7–2.4)

## 2022-09-27 MED ORDER — IPRATROPIUM-ALBUTEROL 0.5-2.5 (3) MG/3ML IN SOLN: 3.0000 mL | Freq: Four times a day (QID) | RESPIRATORY_TRACT | Status: DC | PRN

## 2022-09-27 MED ORDER — ENOXAPARIN SODIUM 40 MG/0.4ML IJ SOSY
40.0000 mg | PREFILLED_SYRINGE | INTRAMUSCULAR | Status: DC
  Administered 2022-09-28: 40 mg via SUBCUTANEOUS
  Filled 2022-09-27: qty 0.4

## 2022-09-27 MED ORDER — ACETAMINOPHEN 325 MG PO TABS
650.0000 mg | ORAL_TABLET | Freq: Four times a day (QID) | ORAL | Status: DC | PRN
  Administered 2022-09-27: 650 mg via ORAL
  Filled 2022-09-27: qty 2

## 2022-09-27 NOTE — Progress Notes (Addendum)
eLink Physician-Brief Progress Note Patient Name: AMIL MOSEMAN DOB: Sep 16, 1946 MRN: [id number]   Date of Service  09/27/2022  HPI/Events of Note  Patient has rectal pain after she treated herself with digital rectal manipulation for constipation and it is now sore  eICU Interventions  Tylenol ordered      Intervention Category Intermediate Interventions: Pain - evaluation and management  Sephiroth Mcluckie G Sheikh Leverich 09/27/2022, 1:18 AM  Addendum at 6:10 am: Request for PRN neb, takes at home. Order placed

## 2022-09-27 NOTE — Progress Notes (Signed)
09/27/2022  I have seen and evaluated the patient for pneumonia.  S:  Got confused this AM, ABG showing retention, placed on BIPAP then woke up more and removed BIPAP.  O: Blood pressure 126/67, pulse 85, temperature 97.8 F (36.6 C), temperature source Axillary, resp. rate (!) 21, height '5\' 2"'$  (1.575 m), weight 46.7 kg, SpO2 99 %.  No distress Chronically ill Rhonci on R +muscle wasting  CXR RUL infiltrate  A:  RUL CAP AECOPD Acute on chronic hypoxemic and hypercarbic resp failure  P:  BIPAP PRN IV steroids LABA/LAMA/ICS nebs Unasyn/azithro, follow culture data Reinforced that BIPAP may keep her off vent, she's willing to try Careful with PTA xanax Will be available PRN, let us know if any worsening.  Erskine Emery MD [facility] Prefer epic messenger for cross cover needs If after hours, please call E-link

## 2022-09-27 NOTE — Progress Notes (Signed)
Pt removed herself from bipap and now refuses to put it back on. MD with CCM aware.

## 2022-09-27 NOTE — Progress Notes (Signed)
PROGRESS NOTE    [name]  MPN:[id number] DOB: May 07, 1946 DOA: 09/26/2022 PCP: [name], MD    Brief Narrative:   77 yo F PMH breast ca, Emphysema, chronic hypoxic respiratory failure on 4L, diastolic HF, CAD, HLD, AS, presenting with dry cough, fatigue, chills.  Found to be severely hypoxemic and have acute on chronic hypercapniec respiratory failure.  Refusing BIPAP and having increasing agitation, hyoxemia so PCCM consulted.  Imaging showing RUL infiltrate.    Assessment and Plan: RUL CAP due to infectious organism  -worsening on 1/1 x ray -continue abx  Acute metabolic encephalopathy  -lives at home -baseline unclear although she was interactive yesterday not so much today  Acute on chronic respiratory failure with hypoxia and hypercapnia ([place]) in setting of advanced COPD -initially went to the ICU. -cotinue abx -check ABG-- may need bipap for CO2 retention -wean O2 (wears 3L at baseline)-- does not need to be 100%   Chronic diastolic CHF (congestive heart failure) (HCC) Echocardiogram with preserved LV systolic function EF 60 to 65% with mild LVH, preserved RV systolic function, moderate to severe aortic stenosis.  No clinical signs of heart failure exacerbation -d/c IVF  Constipation -bowel regimen  Chronic benzo use -resume home meds  DVT prophylaxis: enoxaparin (LOVENOX) injection 30 mg Start: 09/26/22 1000    Code Status: Full Code Family Communication:   Disposition Plan:  Level of care: Progressive Status is: Inpatient Remains inpatient appropriate    Consultants:  PCCM    Subjective: Hand purple/mottled-- RN says chronic  Objective: Vitals:   09/27/22 0826 09/27/22 0831 09/27/22 0839 09/27/22 0900  BP:    126/67  Pulse:  88  85  Resp:  (!) 32  (!) 21  Temp:   97.8 F (36.6 C)   TempSrc:   Axillary   SpO2: 98% 98%  99%  Weight:      Height:        Intake/Output Summary (Last 24 hours) at 09/27/2022 1230 Last data filed at  09/27/2022 0900 Gross per 24 hour  Intake 2009.22 ml  Output --  Net 2009.22 ml   Filed Weights   09/26/22 0331 09/26/22 1054  Weight: 54.4 kg 46.7 kg    Examination:   General: Appearance:    Thin female in no acute distress     Lungs:     On , wearing mask  Heart:    Normal heart rate. Normal rhythm. No murmurs, rubs, or gallops.    MS:   All extremities are intact.    Neurologic:   Will awake-- says she in the the hospital but will answer no further questions       Data Reviewed: I have personally reviewed following labs and imaging studies  CBC: Recent Labs  Lab 09/26/22 0351 09/26/22 0357 09/27/22 0427  WBC 9.4  --  13.0*  NEUTROABS 8.0*  --   --   HGB 10.6* 11.9* 11.3*  HCT 36.4 35.0* 39.7  MCV 97.8  --  98.8  PLT 227  --  008   Basic Metabolic Panel: Recent Labs  Lab 09/26/22 0351 09/26/22 0357 09/27/22 0838  NA 138 138 137  K 4.1 4.1 4.3  CL 99  --  95*  CO2 33*  --  34*  GLUCOSE 105*  --  103*  BUN 15  --  12  CREATININE 1.00  --  0.78  CALCIUM 8.5*  --  8.4*  MG  --   --  2.2  GFR: Estimated Creatinine Clearance: 44.1 mL/min (by C-G formula based on SCr of 0.78 mg/dL). Liver Function Tests: No results for input(s): "AST", "ALT", "ALKPHOS", "BILITOT", "PROT", "ALBUMIN" in the last 168 hours. No results for input(s): "LIPASE", "AMYLASE" in the last 168 hours. No results for input(s): "AMMONIA" in the last 168 hours. Coagulation Profile: No results for input(s): "INR", "PROTIME" in the last 168 hours. Cardiac Enzymes: No results for input(s): "CKTOTAL", "CKMB", "CKMBINDEX", "TROPONINI" in the last 168 hours. BNP (last 3 results) No results for input(s): "PROBNP" in the last 8760 hours. HbA1C: No results for input(s): "HGBA1C" in the last 72 hours. CBG: Recent Labs  Lab 09/26/22 1116 09/26/22 1208 09/26/22 1546 09/26/22 2006 09/26/22 2008  GLUCAP 69* 69* 140* 62* 81   Lipid Profile: No results for input(s): "CHOL", "HDL",  "LDLCALC", "TRIG", "CHOLHDL", "LDLDIRECT" in the last 72 hours. Thyroid Function Tests: No results for input(s): "TSH", "T4TOTAL", "FREET4", "T3FREE", "THYROIDAB" in the last 72 hours. Anemia Panel: No results for input(s): "VITAMINB12", "FOLATE", "FERRITIN", "TIBC", "IRON", "RETICCTPCT" in the last 72 hours. Sepsis Labs: Recent Labs  Lab 09/26/22 0916 09/26/22 1129  PROCALCITON  --  0.15  LATICACIDVEN 1.0 0.7    Recent Results (from the past 240 hour(s))  Resp panel by RT-PCR (RSV, Flu A&B, Covid) Anterior Nasal Swab     Status: None   Collection Time: 09/26/22  6:52 AM   Specimen: Anterior Nasal Swab  Result Value Ref Range Status   SARS Coronavirus 2 by RT PCR NEGATIVE NEGATIVE Final    Comment: (NOTE) SARS-CoV-2 target nucleic acids are NOT DETECTED.  The SARS-CoV-2 RNA is generally detectable in upper respiratory specimens during the acute phase of infection. The lowest concentration of SARS-CoV-2 viral copies this assay can detect is 138 copies/mL. A negative result does not preclude SARS-Cov-2 infection and should not be used as the sole basis for treatment or other patient management decisions. A negative result may occur with  improper specimen collection/handling, submission of specimen other than nasopharyngeal swab, presence of viral mutation(s) within the areas targeted by this assay, and inadequate number of viral copies(<138 copies/mL). A negative result must be combined with clinical observations, patient history, and epidemiological information. The expected result is Negative.  Fact Sheet for Patients:  EntrepreneurPulse.com.au  Fact Sheet for Healthcare Providers:  IncredibleEmployment.be  This test is no t yet approved or cleared by the [place] FDA and  has been authorized for detection and/or diagnosis of SARS-CoV-2 by FDA under an Emergency Use Authorization (EUA). This EUA will remain  in effect (meaning this  test can be used) for the duration of the COVID-19 declaration under Section 564(b)(1) of the Act, 21 U.S.C.section 360bbb-3(b)(1), unless the authorization is terminated  or revoked sooner.       Influenza A by PCR NEGATIVE NEGATIVE Final   Influenza B by PCR NEGATIVE NEGATIVE Final    Comment: (NOTE) The Xpert Xpress SARS-CoV-2/FLU/RSV plus assay is intended as an aid in the diagnosis of influenza from Nasopharyngeal swab specimens and should not be used as a sole basis for treatment. Nasal washings and aspirates are unacceptable for Xpert Xpress SARS-CoV-2/FLU/RSV testing.  Fact Sheet for Patients: EntrepreneurPulse.com.au  Fact Sheet for Healthcare Providers: IncredibleEmployment.be  This test is not yet approved or cleared by the [place] FDA and has been authorized for detection and/or diagnosis of SARS-CoV-2 by FDA under an Emergency Use Authorization (EUA). This EUA will remain in effect (meaning this test can be used) for the duration  of the COVID-19 declaration under Section 564(b)(1) of the Act, 21 U.S.C. section 360bbb-3(b)(1), unless the authorization is terminated or revoked.     Resp Syncytial Virus by PCR NEGATIVE NEGATIVE Final    Comment: (NOTE) Fact Sheet for Patients: EntrepreneurPulse.com.au  Fact Sheet for Healthcare Providers: IncredibleEmployment.be  This test is not yet approved or cleared by the [place] FDA and has been authorized for detection and/or diagnosis of SARS-CoV-2 by FDA under an Emergency Use Authorization (EUA). This EUA will remain in effect (meaning this test can be used) for the duration of the COVID-19 declaration under Section 564(b)(1) of the Act, 21 U.S.C. section 360bbb-3(b)(1), unless the authorization is terminated or revoked.  Performed at [facility], [place] [address], [place], [place] [address]   Culture, blood (routine x 2)      Status: None (Preliminary result)   Collection Time: 09/26/22  9:50 AM   Specimen: BLOOD  Result Value Ref Range Status   Specimen Description BLOOD SITE NOT SPECIFIED  Final   Special Requests BOTTLES DRAWN AEROBIC AND ANAEROBIC  Final   Culture   Final    NO GROWTH < 24 HOURS Performed at [facility], [place] [address], [place], [place] [address]    Report Status PENDING  Incomplete  Culture, blood (routine x 2)     Status: None (Preliminary result)   Collection Time: 09/26/22  9:50 AM   Specimen: BLOOD  Result Value Ref Range Status   Specimen Description BLOOD SITE NOT SPECIFIED  Final   Special Requests BOTTLES DRAWN AEROBIC AND ANAEROBIC  Final   Culture   Final    NO GROWTH < 24 HOURS Performed at [facility], [place] [address] [place], [place] [address]    Report Status PENDING  Incomplete  MRSA Next Gen by PCR, Nasal     Status: None   Collection Time: 09/26/22 10:42 AM  Result Value Ref Range Status   MRSA by PCR Next Gen NOT DETECTED NOT DETECTED Final    Comment: (NOTE) The GeneXpert MRSA Assay (FDA approved for NASAL specimens only), is one component of a comprehensive MRSA colonization surveillance program. It is not intended to diagnose MRSA infection nor to guide or monitor treatment for MRSA infections. Test performance is not FDA approved in patients less than 66 years old. Performed at [facility], [place] [address] [place], [place] [address]   Respiratory (~20 pathogens) panel by PCR     Status: None   Collection Time: 09/26/22 11:50 AM   Specimen: Nasopharyngeal Swab; Respiratory  Result Value Ref Range Status   Adenovirus NOT DETECTED NOT DETECTED Final   Coronavirus 229E NOT DETECTED NOT DETECTED Final    Comment: (NOTE) The Coronavirus on the Respiratory Panel, DOES NOT test for the novel  Coronavirus (2019 nCoV)    Coronavirus HKU1 NOT DETECTED NOT DETECTED Final   Coronavirus NL63 NOT DETECTED NOT DETECTED Final   Coronavirus OC43  NOT DETECTED NOT DETECTED Final   Metapneumovirus NOT DETECTED NOT DETECTED Final   Rhinovirus / Enterovirus NOT DETECTED NOT DETECTED Final   Influenza A NOT DETECTED NOT DETECTED Final   Influenza B NOT DETECTED NOT DETECTED Final   Parainfluenza Virus 1 NOT DETECTED NOT DETECTED Final   Parainfluenza Virus 2 NOT DETECTED NOT DETECTED Final   Parainfluenza Virus 3 NOT DETECTED NOT DETECTED Final   Parainfluenza Virus 4 NOT DETECTED NOT DETECTED Final   Respiratory Syncytial Virus NOT DETECTED NOT  DETECTED Final   Bordetella pertussis NOT DETECTED NOT DETECTED Final   Bordetella Parapertussis NOT DETECTED NOT DETECTED Final   Chlamydophila pneumoniae NOT DETECTED NOT DETECTED Final   Mycoplasma pneumoniae NOT DETECTED NOT DETECTED Final    Comment: Performed at [facility], [place] [address] [place], [place] [address]  Expectorated Sputum Assessment w Gram Stain, Rflx to Resp Cult     Status: None   Collection Time: 09/26/22  6:40 PM   Specimen: Expectorated Sputum  Result Value Ref Range Status   Specimen Description EXPECTORATED SPUTUM  Final   Special Requests NONE  Final   Sputum evaluation   Final    THIS SPECIMEN IS ACCEPTABLE FOR SPUTUM CULTURE Performed at [facility], [place] [address], [place], [place] [address]    Report Status 09/26/2022 FINAL  Final  Culture, Respiratory w Gram Stain     Status: None (Preliminary result)   Collection Time: 09/26/22  6:40 PM  Result Value Ref Range Status   Specimen Description EXPECTORATED SPUTUM  Final   Special Requests NONE Reflexed from [id number]  Final   Gram Stain   Final    RARE WBC PRESENT, PREDOMINANTLY PMN NO ORGANISMS SEEN    Culture   Final    NO GROWTH < 12 HOURS Performed at [facility], [place] [address] [place],  [address]    Report Status PENDING  Incomplete         Radiology Studies: VAS [place] LOWER EXTREMITY VENOUS (DVT)  Result Date: 09/27/2022  Lower Venous DVT Study Patient Name:  JAZARIAH TEALL  Date of Exam:   09/26/2022 Medical Rec #: [id number]        Accession #:    [id number] Date of Birth: 10-Feb-1946        Patient Gender: F Patient Age:   32 years Exam Location:  [facility] Procedure:      VAS [place] LOWER EXTREMITY VENOUS (DVT) Referring Phys: Ina Homes --------------------------------------------------------------------------------  Indications: Edema.  Comparison Study: no prior Performing Technologist: Archie Patten RVS  Examination Guidelines: A complete evaluation includes B-mode imaging, spectral Doppler, color Doppler, and power Doppler as needed of all accessible portions of each vessel. Bilateral testing is considered an integral part of a complete examination. Limited examinations for reoccurring indications may be performed as noted. The reflux portion of the exam is performed with the patient in reverse Trendelenburg.  +---------+---------------+---------+-----------+----------+--------------+ RIGHT    CompressibilityPhasicitySpontaneityPropertiesThrombus Aging +---------+---------------+---------+-----------+----------+--------------+ CFV      Full           Yes      Yes                                 +---------+---------------+---------+-----------+----------+--------------+ SFJ      Full                                                        +---------+---------------+---------+-----------+----------+--------------+ FV Prox  Full                                                        +---------+---------------+---------+-----------+----------+--------------+  FV Mid   Full                                                        +---------+---------------+---------+-----------+----------+--------------+ FV DistalFull                                                        +---------+---------------+---------+-----------+----------+--------------+ PFV      Full                                                         +---------+---------------+---------+-----------+----------+--------------+ POP      Full           Yes      Yes                                 +---------+---------------+---------+-----------+----------+--------------+ PTV      Full                                                        +---------+---------------+---------+-----------+----------+--------------+ PERO     Full                                                        +---------+---------------+---------+-----------+----------+--------------+   +---------+---------------+---------+-----------+----------+--------------+ LEFT     CompressibilityPhasicitySpontaneityPropertiesThrombus Aging +---------+---------------+---------+-----------+----------+--------------+ CFV      Full           Yes      Yes                                 +---------+---------------+---------+-----------+----------+--------------+ SFJ      Full                                                        +---------+---------------+---------+-----------+----------+--------------+ FV Prox  Full                                                        +---------+---------------+---------+-----------+----------+--------------+ FV Mid   Full                                                        +---------+---------------+---------+-----------+----------+--------------+  FV DistalFull                                                        +---------+---------------+---------+-----------+----------+--------------+ PFV      Full                                                        +---------+---------------+---------+-----------+----------+--------------+ POP      Full           Yes      Yes                                 +---------+---------------+---------+-----------+----------+--------------+ PTV      Full                                                         +---------+---------------+---------+-----------+----------+--------------+ PERO     Full                                                        +---------+---------------+---------+-----------+----------+--------------+     Summary: BILATERAL: - No evidence of deep vein thrombosis seen in the lower extremities, bilaterally. -No evidence of popliteal cyst, bilaterally.   *See table(s) above for measurements and observations. Electronically signed by Jamelle Haring on 09/27/2022 at 10:17:29 AM.    Final    DG Chest Port 1 View  Result Date: 09/27/2022 CLINICAL DATA:  77 year old female with pneumonia. EXAM: PORTABLE CHEST 1 VIEW COMPARISON:  PA and lateral chest radiographs 09/26/2022 and earlier. FINDINGS: Portable AP view at 0548 hours. Progressed confluence of right upper lobe consolidation since yesterday. Lobar consolidation now. Right lung base seems to remain spared. Mildly improved lung volumes. Stable cardiomegaly and mediastinal contours. Left lung appears to remain clear. No acute osseous abnormality identified. IMPRESSION: 1. Progressed Lobar Consolidation in the right upper lobe since yesterday. 2. No pleural effusion or other acute cardiopulmonary abnormality identified. 3. Chronic cardiomegaly. Electronically Signed   By: Genevie Ann M.D.   On: 09/27/2022 06:19   DG Chest 2 View  Result Date: 09/26/2022 CLINICAL DATA:  Shortness of breath. EXAM: CHEST - 2 VIEW COMPARISON:  August 07, 2022 FINDINGS: The cardiac silhouette is mildly enlarged and unchanged in size. There is marked severity calcification of the aortic arch. Marked severity patchy infiltrate is seen within the right upper lobe. Mild right basilar atelectasis and/or infiltrate is also seen. There is a small right pleural effusion. No pneumothorax is identified. Radiopaque surgical clips are seen within the right upper quadrant. The visualized skeletal structures are unremarkable. IMPRESSION: 1. Marked severity right upper lobe  infiltrate. 2. Mild right basilar atelectasis and/or infiltrate. 3. Small right pleural effusion. Electronically Signed   By: Virgina Norfolk M.D.   On: 09/26/2022 04:24  Scheduled Meds:  arformoterol  15 mcg Nebulization BID   ascorbic acid  500 mg Oral Daily   aspirin EC  325 mg Oral q AM   budesonide (PULMICORT) nebulizer solution  0.5 mg Nebulization BID   Chlorhexidine Gluconate Cloth  6 each Topical Daily   diclofenac Sodium  4 g Topical QID   enoxaparin (LOVENOX) injection  30 mg Subcutaneous Q24H   linaclotide  72 mcg Oral QAC breakfast   loratadine  10 mg Oral QPM   methylPREDNISolone (SOLU-MEDROL) injection  80 mg Intravenous Daily   montelukast  10 mg Oral QHS   nortriptyline  50 mg Oral QHS   omega-3 acid ethyl esters  1 g Oral Daily   pantoprazole  40 mg Oral QAC breakfast   pravastatin  20 mg Oral q1800   revefenacin  175 mcg Nebulization Daily   Continuous Infusions:  sodium chloride Stopped (09/27/22 0607)   ampicillin-sulbactam (UNASYN) IV 3 g (09/27/22 0901)   azithromycin 500 mg (09/27/22 0942)     LOS: 1 day    Time spent: 45 minutes spent on chart review, discussion with nursing staff, consultants, updating family and interview/physical exam; more than 50% of that time was spent in counseling and/or coordination of care.    Geradine Girt, DO [facility] Available via Epic secure chat 7am-7pm After these hours, please refer to coverage provider listed on [email or web address] 09/27/2022, 12:30 PM

## 2022-09-28 DIAGNOSIS — J189 Pneumonia, unspecified organism: Principal | ICD-10-CM

## 2022-09-28 LAB — BASIC METABOLIC PANEL
Anion gap: 7 (ref 5–15)
BUN: 8 mg/dL (ref 8–23)
CO2: 34 mmol/L — ABNORMAL HIGH (ref 22–32)
Calcium: 8.6 mg/dL — ABNORMAL LOW (ref 8.9–10.3)
Chloride: 95 mmol/L — ABNORMAL LOW (ref 98–111)
Creatinine, Ser: 0.69 mg/dL (ref 0.44–1.00)
GFR, Estimated: >60 mL/min (ref >60)
Glucose, Bld: 86 mg/dL (ref 70–99)
Potassium: 4.2 mmol/L (ref 3.5–5.1)
Sodium: 136 mmol/L (ref 135–145)

## 2022-09-28 LAB — CBC
HCT: 34.2 % — ABNORMAL LOW (ref 36.0–46.0)
Hemoglobin: 10.1 g/dL — ABNORMAL LOW (ref 12.0–15.0)
MCH: 28.1 pg (ref 26.0–34.0)
MCHC: 29.5 g/dL — ABNORMAL LOW (ref 30.0–36.0)
MCV: 95.3 fL (ref 80.0–100.0)
Platelets: 220 10*3/uL (ref 150–400)
RBC: 3.59 MIL/uL — ABNORMAL LOW (ref 3.87–5.11)
RDW: 19.5 % — ABNORMAL HIGH (ref 11.5–15.5)
WBC: 9.7 10*3/uL (ref 4.0–10.5)
nRBC: 0 % (ref 0.0–0.2)

## 2022-09-28 LAB — MAGNESIUM: Magnesium: 2.1 mg/dL (ref 1.7–2.4)

## 2022-09-28 MED ORDER — AMOXICILLIN-POT CLAVULANATE 875-125 MG PO TABS
1.0000 | ORAL_TABLET | Freq: Two times a day (BID) | ORAL | Status: DC
  Administered 2022-09-28 – 2022-09-29 (×3): 1 via ORAL
  Filled 2022-09-28 (×3): qty 1

## 2022-09-28 MED ORDER — ENOXAPARIN SODIUM 30 MG/0.3ML IJ SOSY
30.0000 mg | PREFILLED_SYRINGE | INTRAMUSCULAR | Status: DC
  Administered 2022-09-29 – 2022-09-30 (×2): 30 mg via SUBCUTANEOUS
  Filled 2022-09-28 (×2): qty 0.3

## 2022-09-28 MED ORDER — PREDNISONE 5 MG PO TABS
50.0000 mg | ORAL_TABLET | Freq: Every day | ORAL | Status: DC
  Administered 2022-09-29 – 2022-09-30 (×2): 50 mg via ORAL
  Filled 2022-09-28 (×2): qty 2

## 2022-09-28 NOTE — Evaluation (Signed)
Physical Therapy Evaluation Patient Details Name: [name] MRN: [id number] DOB: 05-03-1946 Today's Date: 09/28/2022  History of Present Illness  77 y.o. female presents to [facility] hospital on 09/26/2022 with hypoxemia, found to be in acute on chronic hypercapnic respiratory failure. Imaging demonstrates RUL CAP. PMH - copd, diastolic heart failure, HTN, CAD, breast CA, L1 compression fx,  Clinical Impression  Pt presents to PT with deficits in activity tolerance, cardiopulmonary function, and cognition. Pt ambulates with good quality, distance is limited at this time due to incontinence of stool during session. Pt is confused during eval, reporting she was admitted due to constipation and not respiratory failure. Pt will benefit from aggressive mobilization in an effort to improve activity tolerance and to wean O2 to baseline. PT recommends discharge home when medically ready.       Recommendations for follow up therapy are one component of a multi-disciplinary discharge planning process, led by the attending physician.  Recommendations may be updated based on patient status, additional functional criteria and insurance authorization.  Follow Up Recommendations No PT follow up      Assistance Recommended at Discharge Intermittent Supervision/Assistance  Patient can return home with the following  A little help with bathing/dressing/bathroom;Assistance with cooking/housework;Direct supervision/assist for medications management;Direct supervision/assist for financial management;Assist for transportation;Help with stairs or ramp for entrance    Equipment Recommendations    Recommendations for Other Services       Functional Status Assessment Patient has had a recent decline in their functional status and demonstrates the ability to make significant improvements in function in a reasonable and predictable amount of time.     Precautions / Restrictions Precautions Precautions:  Fall Precaution Comments: incontinent of stool Restrictions Weight Bearing Restrictions: No      Mobility  Bed Mobility                    Transfers Overall transfer level: Needs assistance Equipment used: Rolling walker (2 wheels) Transfers: Sit to/from Stand Sit to Stand: Supervision                Ambulation/Gait Ambulation/Gait assistance: Min guard Gait Distance (Feet): 100 Feet (distance limited by incontinence of stool) Assistive device: Rolling walker (2 wheels) Gait Pattern/deviations: Step-through pattern Gait velocity: reduced Gait velocity interpretation: <1.8 ft/sec, indicate of risk for recurrent falls   General Gait Details: steady step-through gait  Stairs            Wheelchair Mobility    Modified Rankin (Stroke Patients Only)       Balance Overall balance assessment: Needs assistance Sitting-balance support: No upper extremity supported, Feet supported Sitting balance-Leahy Scale: Good     Standing balance support: Single extremity supported, Reliant on assistive device for balance Standing balance-Leahy Scale: Poor                               Pertinent Vitals/Pain Pain Assessment Pain Assessment: No/denies pain    Home Living Family/patient expects to be discharged to:: Private residence Living Arrangements: Children;Other relatives Available Help at Discharge: Family;Available 24 hours/day Type of Home: House Home Access: Stairs to enter Entrance Stairs-Rails: None Entrance Stairs-Number of Steps: 1   Home Layout: One level Home Equipment: Cane - single point;Rollator (4 wheels)      Prior Function Prior Level of Function : Independent/Modified Independent             Mobility Comments: ambulates with SPC  vs rollator ADLs Comments: sponge bathes, and has supervision for showers     Hand Dominance        Extremity/Trunk Assessment   Upper Extremity Assessment Upper Extremity  Assessment: Overall WFL for tasks assessed    Lower Extremity Assessment Lower Extremity Assessment: Generalized weakness    Cervical / Trunk Assessment Cervical / Trunk Assessment: Kyphotic  Communication   Communication: No difficulties  Cognition Arousal/Alertness: Awake/alert Behavior During Therapy: WFL for tasks assessed/performed Overall Cognitive Status: Impaired/Different from baseline Area of Impairment: Orientation, Memory, Following commands, Safety/judgement, Awareness, Problem solving                 Orientation Level: Disoriented to, Situation (reports she was admitted due to constipation)   Memory: Decreased short-term memory Following Commands: Follows one step commands consistently, Follows multi-step commands consistently Safety/Judgement: Decreased awareness of deficits Awareness: Emergent Problem Solving: Requires verbal cues          General Comments General comments (skin integrity, edema, etc.): pt on 4L [place] currently, sats stable when mobilizing. Pt is incontinent of small amount of liquid stool when ambulating in hallway    Exercises     Assessment/Plan    PT Assessment Patient needs continued PT services  PT Problem List Cardiopulmonary status limiting activity;Decreased activity tolerance;Decreased balance;Decreased mobility       PT Treatment Interventions DME instruction;Gait training;Stair training;Functional mobility training;Therapeutic activities;Balance training;Neuromuscular re-education;Patient/family education    PT Goals (Current goals can be found in the Care Plan section)  Acute Rehab PT Goals Patient Stated Goal: to return home PT Goal Formulation: With patient Time For Goal Achievement: 10/12/22 Potential to Achieve Goals: Good Additional Goals Additional Goal #1: Pt will report 1/4 DOE or less when ambulating for >50' to demonstrate improved cardiopulmonary function and activity tolerance    Frequency Min 3X/week      Co-evaluation               AM-PAC PT "6 Clicks" Mobility  Outcome Measure Help needed turning from your back to your side while in a flat bed without using bedrails?: A Little Help needed moving from lying on your back to sitting on the side of a flat bed without using bedrails?: A Little Help needed moving to and from a bed to a chair (including a wheelchair)?: A Little Help needed standing up from a chair using your arms (e.g., wheelchair or bedside chair)?: A Little Help needed to walk in hospital room?: A Little Help needed climbing 3-5 steps with a railing? : A Lot 6 Click Score: 17    End of Session Equipment Utilized During Treatment: Oxygen Activity Tolerance: Patient tolerated treatment well Patient left: in chair;with call bell/phone within reach;with chair alarm set Nurse Communication: Mobility status PT Visit Diagnosis: Other abnormalities of gait and mobility (R26.89)    Time: 4462-8638 PT Time Calculation (min) (ACUTE ONLY): 20 min   Charges:   PT Evaluation $PT Eval Low Complexity: Rolfe, PT, DPT Acute Rehabilitation Office [phone]   Zenaida Niece 09/28/2022, 11:55 AM

## 2022-09-28 NOTE — Progress Notes (Signed)
Transition of Care Department [facility]) following patient for high risk of readmission.   Patient had recent admission in 07/2022 for hypoxia. From home with daughter and grandchildren.  Daughter transports to and from appointments. Has a cane and home O2 from [place].  PCP is [name], MD and uses [facility] on [address].      Transition of Care Department [facility]) has reviewed patient and we will continue to monitor patient advancement through interdisciplinary progression rounds.

## 2022-09-28 NOTE — Evaluation (Signed)
Occupational Therapy Evaluation Patient Details Name: [name] MRN: [id number] DOB: [birth year] Today's Date: 09/28/2022   History of Present Illness 77 y.o. female presents to [facility] hospital on 09/26/2022 with hypoxemia, found to be in acute on chronic hypercapnic respiratory failure. Imaging demonstrates RUL CAP. PMH - copd, diastolic heart failure, HTN, CAD, breast CA, L1 compression fx.   Clinical Impression   Patient admitted for the diagnosis above.  Patient known to OT from recent admit in November.  Patient subjectively states she feels better this am, and was able to walk with PT, but limiting mobility in the room due to loose stools.  At baseline she completes her own sponge baths, and generally walks with a RW.  She uses O2 at night, but none during the day.  Currently, despite loose stool, she is probably close to her baseline, and should be able to return home with prior supports from family.  OT will follow to maximize her functional status.        Recommendations for follow up therapy are one component of a multi-disciplinary discharge planning process, led by the attending physician.  Recommendations may be updated based on patient status, additional functional criteria and insurance authorization.   Follow Up Recommendations  No OT follow up     Assistance Recommended at Discharge Intermittent Supervision/Assistance  Patient can return home with the following Assist for transportation;Assistance with cooking/housework    Functional Status Assessment  Patient has had a recent decline in their functional status and demonstrates the ability to make significant improvements in function in a reasonable and predictable amount of time.  Equipment Recommendations  None recommended by OT    Recommendations for Other Services       Precautions / Restrictions Precautions Precautions: Fall Precaution Comments: incontinent of stool Restrictions Weight Bearing Restrictions: No       Mobility Bed Mobility               General bed mobility comments: up in recliner    Transfers Overall transfer level: Needs assistance Equipment used: Rolling walker (2 wheels) Transfers: Sit to/from Stand Sit to Stand: Supervision                  Balance Overall balance assessment: Needs assistance Sitting-balance support: No upper extremity supported, Feet supported Sitting balance-Leahy Scale: Good     Standing balance support: Reliant on assistive device for balance Standing balance-Leahy Scale: Poor                             ADL either performed or assessed with clinical judgement   ADL       Grooming: Min guard;Standing               Lower Body Dressing: Min guard;Sit to/from stand   Toilet Transfer: Financial planner (2 wheels)                   Vision Patient Visual Report: No change from baseline       Perception     Praxis      Pertinent Vitals/Pain Pain Assessment Pain Assessment: No/denies pain     Hand Dominance Right   Extremity/Trunk Assessment Upper Extremity Assessment Upper Extremity Assessment: Generalized weakness   Lower Extremity Assessment Lower Extremity Assessment: Defer to PT evaluation   Cervical / Trunk Assessment Cervical / Trunk Assessment: Kyphotic   Communication Communication Communication: No difficulties   Cognition  Arousal/Alertness: Awake/alert Behavior During Therapy: WFL for tasks assessed/performed Overall Cognitive Status: Impaired/Different from baseline                       Memory: Decreased short-term memory               General Comments  pt on 4L [place] currently, sats stable when mobilizing. Pt is incontinent of small amount of liquid stool when ambulating in hallway    Exercises     Shoulder Instructions      Home Living Family/patient expects to be discharged to:: Private residence Living Arrangements:  Children;Other relatives Available Help at Discharge: Family;Available 24 hours/day Type of Home: House Home Access: Stairs to enter [employer] of Steps: 1 Entrance Stairs-Rails: None Home Layout: One level     Bathroom Shower/Tub: Teacher, early years/pre: Standard Bathroom Accessibility: Yes How Accessible: Accessible via walker Home Equipment: [place] - single point;Rollator (4 wheels)          Prior Functioning/Environment Prior Level of Function : Independent/Modified Independent             Mobility Comments: ambulates with SPC vs rollator ADLs Comments: sponge bathes, and has supervision for showers        OT Problem List: Impaired balance (sitting and/or standing);Decreased strength;Decreased safety awareness      OT Treatment/Interventions:      OT Goals(Current goals can be found in the care plan section) Acute Rehab OT Goals Patient Stated Goal: Return home OT Goal Formulation: With patient Time For Goal Achievement: 10/12/22 Potential to Achieve Goals: Good ADL Goals Pt Will Perform Grooming: with modified independence;standing Pt Will Perform Lower Body Dressing: with modified independence;sit to/from stand Pt Will Transfer to Toilet: with modified independence;regular height toilet;ambulating  OT Frequency: Min 2X/week    Co-evaluation              AM-PAC OT "6 Clicks" Daily Activity     Outcome Measure Help from another person eating meals?: None Help from another person taking care of personal grooming?: None Help from another person toileting, which includes using toliet, bedpan, or urinal?: A Little Help from another person bathing (including washing, rinsing, drying)?: A Little Help from another person to put on and taking off regular upper body clothing?: None Help from another person to put on and taking off regular lower body clothing?: A Little 6 Click Score: 21   End of Session Equipment Utilized During  Treatment: Rolling walker (2 wheels) Nurse Communication: Mobility status  Activity Tolerance: Other (comment) (Limited by incontinence) Patient left: in chair;with chair alarm set;with call bell/phone within reach  OT Visit Diagnosis: Unsteadiness on feet (R26.81);Muscle weakness (generalized) (M62.81)                Time: 3903-0092 OT Time Calculation (min): 17 min Charges:  OT General Charges $OT Visit: 1 Visit OT Evaluation $OT Eval Moderate Complexity: 1 Mod  09/28/2022  RP, OTR/L  Acute Rehabilitation Services  Office:  [place] 09/28/2022, 12:39 PM

## 2022-09-28 NOTE — Progress Notes (Signed)
PROGRESS NOTE    [name]  LNL:[id number] DOB: 09-02-1946 DOA: 09/26/2022 PCP: [name], MD    Brief Narrative:   77 yo F PMH breast ca, Emphysema, chronic hypoxic respiratory failure on 4L, diastolic HF, CAD, HLD, AS, presenting with dry cough, fatigue, chills.  Found to be severely hypoxemic and have acute on chronic hypercapniec respiratory failure.  Refusing BIPAP and having increasing agitation, hyoxemia so PCCM consulted.  Imaging showing RUL infiltrate.   Slowly improving   Assessment and Plan: RUL CAP due to infectious organism  -worsening on 1/1 x ray -continue abx but change to PO  Acute metabolic encephalopathy  -lives at home -appear resolved  Acute on chronic respiratory failure with hypoxia and hypercapnia ([place]) in setting of advanced COPD -initially went to the ICU. -cotinue abx -wean O2 (wears 3-4L at baseline)-- does not need to be 100%   Chronic diastolic CHF (congestive heart failure) (HCC) Echocardiogram with preserved LV systolic function EF 60 to 65% with mild LVH, preserved RV systolic function, moderate to severe aortic stenosis.  No clinical signs of heart failure exacerbation -d/c IVF  Constipation -bowel regimen  Chronic benzo use -resume home meds  Poor overall prognosis-- needs continue GOC with PCP   DVT prophylaxis: enoxaparin (LOVENOX) injection 30 mg Start: 09/29/22 1000    Code Status: Full Code   Disposition Plan:  Level of care: Progressive Status is: Inpatient Remains inpatient appropriate- home 24-48 hours    Consultants:  PCCM    Subjective: Would work with PT-- says she is feeling better- more awake  Objective: Vitals:   09/27/22 2325 09/28/22 0400 09/28/22 0801 09/28/22 0831  BP: 136/77 117/66 (!) 154/68   Pulse: 87 90 88   Resp: '17 15 18   '$ Temp: 98.9 F (37.2 C) 98.7 F (37.1 C) 97.8 F (36.6 C)   TempSrc: Oral  Oral   SpO2:  100% 98% 100%  Weight:      Height:        Intake/Output  Summary (Last 24 hours) at 09/28/2022 1318 Last data filed at 09/28/2022 0300 Gross per 24 hour  Intake 298 ml  Output --  Net 298 ml   Filed Weights   09/26/22 0331 09/26/22 1054  Weight: 54.4 kg 46.7 kg    Examination:    General: Appearance:    Thin female in no acute distress     Lungs:     Diminished, on , increased work of breathing with movement  Heart:    Normal heart rate.   MS:   All extremities are intact.   Neurologic:   Awake, alert       Data Reviewed: I have personally reviewed following labs and imaging studies  CBC: Recent Labs  Lab 09/26/22 0351 09/26/22 0357 09/27/22 0427 09/28/22 0436  WBC 9.4  --  13.0* 9.7  NEUTROABS 8.0*  --   --   --   HGB 10.6* 11.9* 11.3* 10.1*  HCT 36.4 35.0* 39.7 34.2*  MCV 97.8  --  98.8 95.3  PLT 227  --  198 408   Basic Metabolic Panel: Recent Labs  Lab 09/26/22 0351 09/26/22 0357 09/27/22 0838 09/28/22 0436  NA 138 138 137 136  K 4.1 4.1 4.3 4.2  CL 99  --  95* 95*  CO2 33*  --  34* 34*  GLUCOSE 105*  --  103* 86  BUN 15  --  12 8  CREATININE 1.00  --  0.78 0.69  CALCIUM 8.5*  --  8.4* 8.6*  MG  --   --  2.2 2.1   GFR: Estimated Creatinine Clearance: 44.1 mL/min (by C-G formula based on SCr of 0.69 mg/dL). Liver Function Tests: No results for input(s): "AST", "ALT", "ALKPHOS", "BILITOT", "PROT", "ALBUMIN" in the last 168 hours. No results for input(s): "LIPASE", "AMYLASE" in the last 168 hours. No results for input(s): "AMMONIA" in the last 168 hours. Coagulation Profile: No results for input(s): "INR", "PROTIME" in the last 168 hours. Cardiac Enzymes: No results for input(s): "CKTOTAL", "CKMB", "CKMBINDEX", "TROPONINI" in the last 168 hours. BNP (last 3 results) No results for input(s): "PROBNP" in the last 8760 hours. HbA1C: No results for input(s): "HGBA1C" in the last 72 hours. CBG: Recent Labs  Lab 09/26/22 1116 09/26/22 1208 09/26/22 1546 09/26/22 2006 09/26/22 2008  GLUCAP 69* 69*  140* 62* 81   Lipid Profile: No results for input(s): "CHOL", "HDL", "LDLCALC", "TRIG", "CHOLHDL", "LDLDIRECT" in the last 72 hours. Thyroid Function Tests: No results for input(s): "TSH", "T4TOTAL", "FREET4", "T3FREE", "THYROIDAB" in the last 72 hours. Anemia Panel: No results for input(s): "VITAMINB12", "FOLATE", "FERRITIN", "TIBC", "IRON", "RETICCTPCT" in the last 72 hours. Sepsis Labs: Recent Labs  Lab 09/26/22 0916 09/26/22 1129  PROCALCITON  --  0.15  LATICACIDVEN 1.0 0.7    Recent Results (from the past 240 hour(s))  Resp panel by RT-PCR (RSV, Flu A&B, Covid) Anterior Nasal Swab     Status: None   Collection Time: 09/26/22  6:52 AM   Specimen: Anterior Nasal Swab  Result Value Ref Range Status   SARS Coronavirus 2 by RT PCR NEGATIVE NEGATIVE Final    Comment: (NOTE) SARS-CoV-2 target nucleic acids are NOT DETECTED.  The SARS-CoV-2 RNA is generally detectable in upper respiratory specimens during the acute phase of infection. The lowest concentration of SARS-CoV-2 viral copies this assay can detect is 138 copies/mL. A negative result does not preclude SARS-Cov-2 infection and should not be used as the sole basis for treatment or other patient management decisions. A negative result may occur with  improper specimen collection/handling, submission of specimen other than nasopharyngeal swab, presence of viral mutation(s) within the areas targeted by this assay, and inadequate number of viral copies(<138 copies/mL). A negative result must be combined with clinical observations, patient history, and epidemiological information. The expected result is Negative.  Fact Sheet for Patients:  EntrepreneurPulse.com.au  Fact Sheet for Healthcare Providers:  IncredibleEmployment.be  This test is no t yet approved or cleared by the [place] FDA and  has been authorized for detection and/or diagnosis of SARS-CoV-2 by FDA under an Emergency  Use Authorization (EUA). This EUA will remain  in effect (meaning this test can be used) for the duration of the COVID-19 declaration under Section 564(b)(1) of the Act, 21 U.S.C.section 360bbb-3(b)(1), unless the authorization is terminated  or revoked sooner.       Influenza A by PCR NEGATIVE NEGATIVE Final   Influenza B by PCR NEGATIVE NEGATIVE Final    Comment: (NOTE) The Xpert Xpress SARS-CoV-2/FLU/RSV plus assay is intended as an aid in the diagnosis of influenza from Nasopharyngeal swab specimens and should not be used as a sole basis for treatment. Nasal washings and aspirates are unacceptable for Xpert Xpress SARS-CoV-2/FLU/RSV testing.  Fact Sheet for Patients: EntrepreneurPulse.com.au  Fact Sheet for Healthcare Providers: IncredibleEmployment.be  This test is not yet approved or cleared by the [place] FDA and has been authorized for detection and/or diagnosis of SARS-CoV-2 by FDA under an  Emergency Use Authorization (EUA). This EUA will remain in effect (meaning this test can be used) for the duration of the COVID-19 declaration under Section 564(b)(1) of the Act, 21 U.S.C. section 360bbb-3(b)(1), unless the authorization is terminated or revoked.     Resp Syncytial Virus by PCR NEGATIVE NEGATIVE Final    Comment: (NOTE) Fact Sheet for Patients: EntrepreneurPulse.com.au  Fact Sheet for Healthcare Providers: IncredibleEmployment.be  This test is not yet approved or cleared by the [place] FDA and has been authorized for detection and/or diagnosis of SARS-CoV-2 by FDA under an Emergency Use Authorization (EUA). This EUA will remain in effect (meaning this test can be used) for the duration of the COVID-19 declaration under Section 564(b)(1) of the Act, 21 U.S.C. section 360bbb-3(b)(1), unless the authorization is terminated or revoked.  Performed at [facility], [place] [address] [place], [place] [address]   Culture, blood (routine x 2)     Status: None (Preliminary result)   Collection Time: 09/26/22  9:50 AM   Specimen: BLOOD  Result Value Ref Range Status   Specimen Description BLOOD SITE NOT SPECIFIED  Final   Special Requests BOTTLES DRAWN AEROBIC AND ANAEROBIC  Final   Culture   Final    NO GROWTH 2 DAYS Performed at [facility], [place] [address] [place], [place] [address]    Report Status PENDING  Incomplete  Culture, blood (routine x 2)     Status: None (Preliminary result)   Collection Time: 09/26/22  9:50 AM   Specimen: BLOOD  Result Value Ref Range Status   Specimen Description BLOOD SITE NOT SPECIFIED  Final   Special Requests BOTTLES DRAWN AEROBIC AND ANAEROBIC  Final   Culture   Final    NO GROWTH 2 DAYS Performed at [facility], [place] [address] [place], [place] [address]    Report Status PENDING  Incomplete  MRSA Next Gen by PCR, Nasal     Status: None   Collection Time: 09/26/22 10:42 AM  Result Value Ref Range Status   MRSA by PCR Next Gen NOT DETECTED NOT DETECTED Final    Comment: (NOTE) The GeneXpert MRSA Assay (FDA approved for NASAL specimens only), is one component of a comprehensive MRSA colonization surveillance program. It is not intended to diagnose MRSA infection nor to guide or monitor treatment for MRSA infections. Test performance is not FDA approved in patients less than 27 years old. Performed at [facility], [place] [address], [place], [place] [address]   Respiratory (~20 pathogens) panel by PCR     Status: None   Collection Time: 09/26/22 11:50 AM   Specimen: Nasopharyngeal Swab; Respiratory  Result Value Ref Range Status   Adenovirus NOT DETECTED NOT DETECTED Final   Coronavirus 229E NOT DETECTED NOT DETECTED Final    Comment: (NOTE) The Coronavirus on the Respiratory Panel, DOES NOT test for the novel  Coronavirus (2019 nCoV)    Coronavirus HKU1 NOT DETECTED NOT DETECTED Final    Coronavirus NL63 NOT DETECTED NOT DETECTED Final   Coronavirus OC43 NOT DETECTED NOT DETECTED Final   Metapneumovirus NOT DETECTED NOT DETECTED Final   Rhinovirus / Enterovirus NOT DETECTED NOT DETECTED Final   Influenza A NOT DETECTED NOT DETECTED Final   Influenza B NOT DETECTED NOT DETECTED Final   Parainfluenza Virus 1 NOT DETECTED NOT DETECTED Final   Parainfluenza Virus 2 NOT DETECTED NOT DETECTED Final   Parainfluenza Virus 3 NOT DETECTED NOT DETECTED Final  Parainfluenza Virus 4 NOT DETECTED NOT DETECTED Final   Respiratory Syncytial Virus NOT DETECTED NOT DETECTED Final   Bordetella pertussis NOT DETECTED NOT DETECTED Final   Bordetella Parapertussis NOT DETECTED NOT DETECTED Final   Chlamydophila pneumoniae NOT DETECTED NOT DETECTED Final   Mycoplasma pneumoniae NOT DETECTED NOT DETECTED Final    Comment: Performed at [facility], [place] [address], [place], [place] [address]  Expectorated Sputum Assessment w Gram Stain, Rflx to Resp Cult     Status: None   Collection Time: 09/26/22  6:40 PM   Specimen: Expectorated Sputum  Result Value Ref Range Status   Specimen Description EXPECTORATED SPUTUM  Final   Special Requests NONE  Final   Sputum evaluation   Final    THIS SPECIMEN IS ACCEPTABLE FOR SPUTUM CULTURE Performed at [facility], [place] [address], [place], [place] [address]    Report Status 09/26/2022 FINAL  Final  Culture, Respiratory w Gram Stain     Status: None (Preliminary result)   Collection Time: 09/26/22  6:40 PM  Result Value Ref Range Status   Specimen Description EXPECTORATED SPUTUM  Final   Special Requests NONE Reflexed from [id number]  Final   Gram Stain   Final    RARE WBC PRESENT, PREDOMINANTLY PMN NO ORGANISMS SEEN    Culture   Final    NO GROWTH < 12 HOURS Performed at [facility], [place] [address], [place], [place] [address]    Report Status PENDING  Incomplete         Radiology Studies: VAS [place] LOWER EXTREMITY VENOUS  (DVT)  Result Date: 09/27/2022  Lower Venous DVT Study Patient Name:  [name]  Date of Exam:   09/26/2022 Medical Rec #: [id number]        Accession #:    [id number] Date of Birth: 26-Sep-1946        Patient Gender: F Patient Age:   64 years Exam Location:  [facility] Procedure:      VAS [place] LOWER EXTREMITY VENOUS (DVT) Referring Phys: Ina Homes --------------------------------------------------------------------------------  Indications: Edema.  Comparison Study: no prior Performing Technologist: Archie Patten RVS  Examination Guidelines: A complete evaluation includes B-mode imaging, spectral Doppler, color Doppler, and power Doppler as needed of all accessible portions of each vessel. Bilateral testing is considered an integral part of a complete examination. Limited examinations for reoccurring indications may be performed as noted. The reflux portion of the exam is performed with the patient in reverse Trendelenburg.  +---------+---------------+---------+-----------+----------+--------------+ RIGHT    CompressibilityPhasicitySpontaneityPropertiesThrombus Aging +---------+---------------+---------+-----------+----------+--------------+ CFV      Full           Yes      Yes                                 +---------+---------------+---------+-----------+----------+--------------+ SFJ      Full                                                        +---------+---------------+---------+-----------+----------+--------------+ FV Prox  Full                                                        +---------+---------------+---------+-----------+----------+--------------+  FV Mid   Full                                                        +---------+---------------+---------+-----------+----------+--------------+ FV DistalFull                                                        +---------+---------------+---------+-----------+----------+--------------+ PFV       Full                                                        +---------+---------------+---------+-----------+----------+--------------+ POP      Full           Yes      Yes                                 +---------+---------------+---------+-----------+----------+--------------+ PTV      Full                                                        +---------+---------------+---------+-----------+----------+--------------+ PERO     Full                                                        +---------+---------------+---------+-----------+----------+--------------+   +---------+---------------+---------+-----------+----------+--------------+ LEFT     CompressibilityPhasicitySpontaneityPropertiesThrombus Aging +---------+---------------+---------+-----------+----------+--------------+ CFV      Full           Yes      Yes                                 +---------+---------------+---------+-----------+----------+--------------+ SFJ      Full                                                        +---------+---------------+---------+-----------+----------+--------------+ FV Prox  Full                                                        +---------+---------------+---------+-----------+----------+--------------+ FV Mid   Full                                                        +---------+---------------+---------+-----------+----------+--------------+  FV DistalFull                                                        +---------+---------------+---------+-----------+----------+--------------+ PFV      Full                                                        +---------+---------------+---------+-----------+----------+--------------+ POP      Full           Yes      Yes                                 +---------+---------------+---------+-----------+----------+--------------+ PTV      Full                                                         +---------+---------------+---------+-----------+----------+--------------+ PERO     Full                                                        +---------+---------------+---------+-----------+----------+--------------+     Summary: BILATERAL: - No evidence of deep vein thrombosis seen in the lower extremities, bilaterally. -No evidence of popliteal cyst, bilaterally.   *See table(s) above for measurements and observations. Electronically signed by Jamelle Haring on 09/27/2022 at 10:17:29 AM.    Final    DG Chest Port 1 View  Result Date: 09/27/2022 CLINICAL DATA:  77 year old female with pneumonia. EXAM: PORTABLE CHEST 1 VIEW COMPARISON:  PA and lateral chest radiographs 09/26/2022 and earlier. FINDINGS: Portable AP view at 0548 hours. Progressed confluence of right upper lobe consolidation since yesterday. Lobar consolidation now. Right lung base seems to remain spared. Mildly improved lung volumes. Stable cardiomegaly and mediastinal contours. Left lung appears to remain clear. No acute osseous abnormality identified. IMPRESSION: 1. Progressed Lobar Consolidation in the right upper lobe since yesterday. 2. No pleural effusion or other acute cardiopulmonary abnormality identified. 3. Chronic cardiomegaly. Electronically Signed   By: Genevie Ann M.D.   On: 09/27/2022 06:19        Scheduled Meds:  amoxicillin-clavulanate  1 tablet Oral Q12H   arformoterol  15 mcg Nebulization BID   ascorbic acid  500 mg Oral Daily   aspirin EC  325 mg Oral q AM   budesonide (PULMICORT) nebulizer solution  0.5 mg Nebulization BID   Chlorhexidine Gluconate Cloth  6 each Topical Daily   diclofenac Sodium  4 g Topical QID   [START ON 09/29/2022] enoxaparin (LOVENOX) injection  30 mg Subcutaneous Q24H   linaclotide  72 mcg Oral QAC breakfast   loratadine  10 mg Oral QPM   montelukast  10 mg Oral QHS   nortriptyline  50 mg Oral QHS   omega-3 acid ethyl esters  1 g Oral  Daily   pantoprazole  40  mg Oral QAC breakfast   pravastatin  20 mg Oral q1800   [START ON 09/29/2022] predniSONE  50 mg Oral Q breakfast   revefenacin  175 mcg Nebulization Daily   Continuous Infusions:  sodium chloride Stopped (09/27/22 0607)   azithromycin 500 mg (09/28/22 1114)     LOS: 2 days    Time spent: 45 minutes spent on chart review, discussion with nursing staff, consultants, updating family and interview/physical exam; more than 50% of that time was spent in counseling and/or coordination of care.    Geradine Girt, DO [facility] Available via Epic secure chat 7am-7pm After these hours, please refer to coverage provider listed on [email or web address] 09/28/2022, 1:18 PM

## 2022-09-29 DIAGNOSIS — R0600 Dyspnea, unspecified: Secondary | ICD-10-CM | POA: Diagnosis not present

## 2022-09-29 LAB — BASIC METABOLIC PANEL
Anion gap: 9 (ref 5–15)
BUN: 11 mg/dL (ref 8–23)
CO2: 32 mmol/L (ref 22–32)
Calcium: 8.5 mg/dL — ABNORMAL LOW (ref 8.9–10.3)
Chloride: 95 mmol/L — ABNORMAL LOW (ref 98–111)
Creatinine, Ser: 0.68 mg/dL (ref 0.44–1.00)
GFR, Estimated: >60 mL/min (ref >60)
Glucose, Bld: 86 mg/dL (ref 70–99)
Potassium: 4.6 mmol/L (ref 3.5–5.1)
Sodium: 136 mmol/L (ref 135–145)

## 2022-09-29 LAB — CBC
HCT: 34.2 % — ABNORMAL LOW (ref 36.0–46.0)
Hemoglobin: 10.1 g/dL — ABNORMAL LOW (ref 12.0–15.0)
MCH: 27.8 pg (ref 26.0–34.0)
MCHC: 29.5 g/dL — ABNORMAL LOW (ref 30.0–36.0)
MCV: 94.2 fL (ref 80.0–100.0)
Platelets: 226 10*3/uL (ref 150–400)
RBC: 3.63 MIL/uL — ABNORMAL LOW (ref 3.87–5.11)
RDW: 19.1 % — ABNORMAL HIGH (ref 11.5–15.5)
WBC: 8.7 10*3/uL (ref 4.0–10.5)
nRBC: 0 % (ref 0.0–0.2)

## 2022-09-29 LAB — LEGIONELLA PNEUMOPHILA SEROGP 1 UR AG: L. pneumophila Serogp 1 Ur Ag: NEGATIVE

## 2022-09-29 LAB — MAGNESIUM: Magnesium: 1.9 mg/dL (ref 1.7–2.4)

## 2022-09-29 MED ORDER — AZITHROMYCIN 500 MG PO TABS: 500.0000 mg | ORAL_TABLET | Freq: Once | ORAL | Status: DC

## 2022-09-29 MED ORDER — LEVOFLOXACIN 750 MG PO TABS
750.0000 mg | ORAL_TABLET | ORAL | Status: DC
  Administered 2022-09-29: 750 mg via ORAL
  Filled 2022-09-29: qty 1

## 2022-09-29 NOTE — Plan of Care (Signed)

## 2022-09-29 NOTE — Care Management Important Message (Signed)
Important Message  Patient Details  Name: [name] MRN: [id number] Date of Birth: May 13, 1946   Medicare Important Message Given:  Yes     Hannah Beat 09/29/2022, 3:13 PM

## 2022-09-29 NOTE — Plan of Care (Signed)

## 2022-09-29 NOTE — Progress Notes (Signed)
PROGRESS NOTE    [name]  IPJ:[id number] DOB: 09-18-1946 DOA: 09/26/2022 PCP: [name], MD    Brief Narrative:   77 yo F PMH breast ca, Emphysema, chronic hypoxic respiratory failure on 4L, diastolic HF, CAD, HLD, AS, presenting with dry cough, fatigue, chills.  Found to be severely hypoxemic and have acute on chronic hypercapniec respiratory failure.  Refusing BIPAP and having increasing agitation, hyoxemia so PCCM consulted.  Imaging showing RUL infiltrate.   Slowly improving   Assessment and Plan: RUL CAP due to infectious organism  -worsening on 1/1 x ray -continue abx but change to PO  Acute metabolic encephalopathy  -lives at home -appear resolved  Acute on chronic respiratory failure with hypoxia and hypercapnia ([place]) in setting of advanced COPD -initially went to the ICU. -cotinue abx -wean O2 (wears 3-4L at baseline)-- does not need to be 100%   Chronic diastolic CHF (congestive heart failure) (HCC) Echocardiogram with preserved LV systolic function EF 60 to 65% with mild LVH, preserved RV systolic function, moderate to severe aortic stenosis.  No clinical signs of heart failure exacerbation -d/c IVF  Constipation -bowel regimen  Chronic benzo use -resume home meds  Poor overall prognosis-- needs continue GOC with PCP   DVT prophylaxis: enoxaparin (LOVENOX) injection 30 mg Start: 09/29/22 1000    Code Status: Full Code   Disposition Plan:  Level of care: Progressive Status is: Inpatient Remains inpatient appropriate- home in AM    Consultants:  PCCM    Subjective: Breathing is almost back to baseline  Objective: Vitals:   09/28/22 2300 09/29/22 0409 09/29/22 0500 09/29/22 0828  BP: (!) 151/74 (!) 142/67    Pulse: 84 79    Resp: 13 16    Temp: 98.5 F (36.9 C) 97.7 F (36.5 C)    TempSrc: Oral Oral    SpO2: 100% 100%  100%  Weight:   48.8 kg   Height:       No intake or output data in the 24 hours ending 09/29/22  1245  Filed Weights   09/26/22 0331 09/26/22 1054 09/29/22 0500  Weight: 54.4 kg 46.7 kg 48.8 kg    Examination:     General: Appearance:    Thin female in no acute distress     Lungs:      respirations unlabored  Heart:    Normal heart rate. Normal rhythm. No murmurs, rubs, or gallops.   MS:   All extremities are intact.   Neurologic:   Awake, alert       Data Reviewed: I have personally reviewed following labs and imaging studies  CBC: Recent Labs  Lab 09/26/22 0351 09/26/22 0357 09/27/22 0427 09/28/22 0436 09/29/22 0450  WBC 9.4  --  13.0* 9.7 8.7  NEUTROABS 8.0*  --   --   --   --   HGB 10.6* 11.9* 11.3* 10.1* 10.1*  HCT 36.4 35.0* 39.7 34.2* 34.2*  MCV 97.8  --  98.8 95.3 94.2  PLT 227  --  198 220 734   Basic Metabolic Panel: Recent Labs  Lab 09/26/22 0351 09/26/22 0357 09/27/22 0838 09/28/22 0436 09/29/22 0450  NA 138 138 137 136 136  K 4.1 4.1 4.3 4.2 4.6  CL 99  --  95* 95* 95*  CO2 33*  --  34* 34* 32  GLUCOSE 105*  --  103* 86 86  BUN 15  --  '12 8 11  '$ CREATININE 1.00  --  0.78 0.69 0.68  CALCIUM 8.5*  --  8.4* 8.6* 8.5*  MG  --   --  2.2 2.1 1.9   GFR: Estimated Creatinine Clearance: 46.1 mL/min (by C-G formula based on SCr of 0.68 mg/dL). Liver Function Tests: No results for input(s): "AST", "ALT", "ALKPHOS", "BILITOT", "PROT", "ALBUMIN" in the last 168 hours. No results for input(s): "LIPASE", "AMYLASE" in the last 168 hours. No results for input(s): "AMMONIA" in the last 168 hours. Coagulation Profile: No results for input(s): "INR", "PROTIME" in the last 168 hours. Cardiac Enzymes: No results for input(s): "CKTOTAL", "CKMB", "CKMBINDEX", "TROPONINI" in the last 168 hours. BNP (last 3 results) No results for input(s): "PROBNP" in the last 8760 hours. HbA1C: No results for input(s): "HGBA1C" in the last 72 hours. CBG: Recent Labs  Lab 09/26/22 1116 09/26/22 1208 09/26/22 1546 09/26/22 2006 09/26/22 2008  GLUCAP 69* 69* 140*  62* 81   Lipid Profile: No results for input(s): "CHOL", "HDL", "LDLCALC", "TRIG", "CHOLHDL", "LDLDIRECT" in the last 72 hours. Thyroid Function Tests: No results for input(s): "TSH", "T4TOTAL", "FREET4", "T3FREE", "THYROIDAB" in the last 72 hours. Anemia Panel: No results for input(s): "VITAMINB12", "FOLATE", "FERRITIN", "TIBC", "IRON", "RETICCTPCT" in the last 72 hours. Sepsis Labs: Recent Labs  Lab 09/26/22 0916 09/26/22 1129  PROCALCITON  --  0.15  LATICACIDVEN 1.0 0.7    Recent Results (from the past 240 hour(s))  Resp panel by RT-PCR (RSV, Flu A&B, Covid) Anterior Nasal Swab     Status: None   Collection Time: 09/26/22  6:52 AM   Specimen: Anterior Nasal Swab  Result Value Ref Range Status   SARS Coronavirus 2 by RT PCR NEGATIVE NEGATIVE Final    Comment: (NOTE) SARS-CoV-2 target nucleic acids are NOT DETECTED.  The SARS-CoV-2 RNA is generally detectable in upper respiratory specimens during the acute phase of infection. The lowest concentration of SARS-CoV-2 viral copies this assay can detect is 138 copies/mL. A negative result does not preclude SARS-Cov-2 infection and should not be used as the sole basis for treatment or other patient management decisions. A negative result may occur with  improper specimen collection/handling, submission of specimen other than nasopharyngeal swab, presence of viral mutation(s) within the areas targeted by this assay, and inadequate number of viral copies(<138 copies/mL). A negative result must be combined with clinical observations, patient history, and epidemiological information. The expected result is Negative.  Fact Sheet for Patients:  EntrepreneurPulse.com.au  Fact Sheet for Healthcare Providers:  IncredibleEmployment.be  This test is no t yet approved or cleared by the [place] FDA and  has been authorized for detection and/or diagnosis of SARS-CoV-2 by FDA under an Emergency Use  Authorization (EUA). This EUA will remain  in effect (meaning this test can be used) for the duration of the COVID-19 declaration under Section 564(b)(1) of the Act, 21 U.S.C.section 360bbb-3(b)(1), unless the authorization is terminated  or revoked sooner.       Influenza A by PCR NEGATIVE NEGATIVE Final   Influenza B by PCR NEGATIVE NEGATIVE Final    Comment: (NOTE) The Xpert Xpress SARS-CoV-2/FLU/RSV plus assay is intended as an aid in the diagnosis of influenza from Nasopharyngeal swab specimens and should not be used as a sole basis for treatment. Nasal washings and aspirates are unacceptable for Xpert Xpress SARS-CoV-2/FLU/RSV testing.  Fact Sheet for Patients: EntrepreneurPulse.com.au  Fact Sheet for Healthcare Providers: IncredibleEmployment.be  This test is not yet approved or cleared by the [place] FDA and has been authorized for detection and/or diagnosis of SARS-CoV-2 by FDA  under an Emergency Use Authorization (EUA). This EUA will remain in effect (meaning this test can be used) for the duration of the COVID-19 declaration under Section 564(b)(1) of the Act, 21 U.S.C. section 360bbb-3(b)(1), unless the authorization is terminated or revoked.     Resp Syncytial Virus by PCR NEGATIVE NEGATIVE Final    Comment: (NOTE) Fact Sheet for Patients: EntrepreneurPulse.com.au  Fact Sheet for Healthcare Providers: IncredibleEmployment.be  This test is not yet approved or cleared by the [place] FDA and has been authorized for detection and/or diagnosis of SARS-CoV-2 by FDA under an Emergency Use Authorization (EUA). This EUA will remain in effect (meaning this test can be used) for the duration of the COVID-19 declaration under Section 564(b)(1) of the Act, 21 U.S.C. section 360bbb-3(b)(1), unless the authorization is terminated or revoked.  Performed at [facility], [place]  [address] [place], [place] [address]   Culture, blood (routine x 2)     Status: None (Preliminary result)   Collection Time: 09/26/22  9:50 AM   Specimen: BLOOD  Result Value Ref Range Status   Specimen Description BLOOD SITE NOT SPECIFIED  Final   Special Requests BOTTLES DRAWN AEROBIC AND ANAEROBIC  Final   Culture   Final    NO GROWTH 3 DAYS Performed at [facility], [place] [address], [place], [place] [address]    Report Status PENDING  Incomplete  Culture, blood (routine x 2)     Status: None (Preliminary result)   Collection Time: 09/26/22  9:50 AM   Specimen: BLOOD  Result Value Ref Range Status   Specimen Description BLOOD SITE NOT SPECIFIED  Final   Special Requests BOTTLES DRAWN AEROBIC AND ANAEROBIC  Final   Culture   Final    NO GROWTH 3 DAYS Performed at [facility], [place] [address] [place], [place] [address]    Report Status PENDING  Incomplete  MRSA Next Gen by PCR, Nasal     Status: None   Collection Time: 09/26/22 10:42 AM  Result Value Ref Range Status   MRSA by PCR Next Gen NOT DETECTED NOT DETECTED Final    Comment: (NOTE) The GeneXpert MRSA Assay (FDA approved for NASAL specimens only), is one component of a comprehensive MRSA colonization surveillance program. It is not intended to diagnose MRSA infection nor to guide or monitor treatment for MRSA infections. Test performance is not FDA approved in patients less than 16 years old. Performed at [facility], [place] [address] [place], [place] [address]   Respiratory (~20 pathogens) panel by PCR     Status: None   Collection Time: 09/26/22 11:50 AM   Specimen: Nasopharyngeal Swab; Respiratory  Result Value Ref Range Status   Adenovirus NOT DETECTED NOT DETECTED Final   Coronavirus 229E NOT DETECTED NOT DETECTED Final    Comment: (NOTE) The Coronavirus on the Respiratory Panel, DOES NOT test for the novel  Coronavirus (2019 nCoV)    Coronavirus HKU1 NOT DETECTED NOT DETECTED Final    Coronavirus NL63 NOT DETECTED NOT DETECTED Final   Coronavirus OC43 NOT DETECTED NOT DETECTED Final   Metapneumovirus NOT DETECTED NOT DETECTED Final   Rhinovirus / Enterovirus NOT DETECTED NOT DETECTED Final   Influenza A NOT DETECTED NOT DETECTED Final   Influenza B NOT DETECTED NOT DETECTED Final   Parainfluenza Virus 1 NOT DETECTED NOT DETECTED Final   Parainfluenza Virus 2 NOT DETECTED NOT DETECTED Final   Parainfluenza Virus 3 NOT DETECTED NOT DETECTED  Final   Parainfluenza Virus 4 NOT DETECTED NOT DETECTED Final   Respiratory Syncytial Virus NOT DETECTED NOT DETECTED Final   Bordetella pertussis NOT DETECTED NOT DETECTED Final   Bordetella Parapertussis NOT DETECTED NOT DETECTED Final   Chlamydophila pneumoniae NOT DETECTED NOT DETECTED Final   Mycoplasma pneumoniae NOT DETECTED NOT DETECTED Final    Comment: Performed at [facility], [place] [address], [place], [place] [address]  Expectorated Sputum Assessment w Gram Stain, Rflx to Resp Cult     Status: None   Collection Time: 09/26/22  6:40 PM   Specimen: Expectorated Sputum  Result Value Ref Range Status   Specimen Description EXPECTORATED SPUTUM  Final   Special Requests NONE  Final   Sputum evaluation   Final    THIS SPECIMEN IS ACCEPTABLE FOR SPUTUM CULTURE Performed at [facility], [place] [address] [place], [place] [address]    Report Status 09/26/2022 FINAL  Final  Culture, Respiratory w Gram Stain     Status: None (Preliminary result)   Collection Time: 09/26/22  6:40 PM  Result Value Ref Range Status   Specimen Description EXPECTORATED SPUTUM  Final   Special Requests NONE Reflexed from [id number]  Final   Gram Stain   Final    RARE WBC PRESENT, PREDOMINANTLY PMN NO ORGANISMS SEEN    Culture   Final    RARE PSEUDOMONAS AERUGINOSA SUSCEPTIBILITIES TO FOLLOW Performed at [facility], [place] [address] [place], [place] [address]    Report Status PENDING  Incomplete         Radiology Studies: No  results found.      Scheduled Meds:  amoxicillin-clavulanate  1 tablet Oral Q12H   arformoterol  15 mcg Nebulization BID   ascorbic acid  500 mg Oral Daily   aspirin EC  325 mg Oral q AM   budesonide (PULMICORT) nebulizer solution  0.5 mg Nebulization BID   Chlorhexidine Gluconate Cloth  6 each Topical Daily   diclofenac Sodium  4 g Topical QID   enoxaparin (LOVENOX) injection  30 mg Subcutaneous Q24H   linaclotide  72 mcg Oral QAC breakfast   loratadine  10 mg Oral QPM   montelukast  10 mg Oral QHS   nortriptyline  50 mg Oral QHS   omega-3 acid ethyl esters  1 g Oral Daily   pantoprazole  40 mg Oral QAC breakfast   pravastatin  20 mg Oral q1800   predniSONE  50 mg Oral Q breakfast   revefenacin  175 mcg Nebulization Daily   Continuous Infusions:  sodium chloride Stopped (09/27/22 0607)   azithromycin 500 mg (09/29/22 1217)     LOS: 3 days    Time spent: 45 minutes spent on chart review, discussion with nursing staff, consultants, updating family and interview/physical exam; more than 50% of that time was spent in counseling and/or coordination of care.    Geradine Girt, DO [facility] Available via Epic secure chat 7am-7pm After these hours, please refer to coverage provider listed on [email or web address] 09/29/2022, 12:45 PM

## 2022-09-30 DIAGNOSIS — R0600 Dyspnea, unspecified: Secondary | ICD-10-CM | POA: Diagnosis not present

## 2022-09-30 LAB — BASIC METABOLIC PANEL
Anion gap: 9 (ref 5–15)
BUN: 13 mg/dL (ref 8–23)
CO2: 31 mmol/L (ref 22–32)
Calcium: 8.9 mg/dL (ref 8.9–10.3)
Chloride: 96 mmol/L — ABNORMAL LOW (ref 98–111)
Creatinine, Ser: 0.74 mg/dL (ref 0.44–1.00)
GFR, Estimated: >60 mL/min (ref >60)
Glucose, Bld: 81 mg/dL (ref 70–99)
Potassium: 4.4 mmol/L (ref 3.5–5.1)
Sodium: 136 mmol/L (ref 135–145)

## 2022-09-30 LAB — CBC
HCT: 39.3 % (ref 36.0–46.0)
Hemoglobin: 11.7 g/dL — ABNORMAL LOW (ref 12.0–15.0)
MCH: 27.7 pg (ref 26.0–34.0)
MCHC: 29.8 g/dL — ABNORMAL LOW (ref 30.0–36.0)
MCV: 93.1 fL (ref 80.0–100.0)
Platelets: 243 10*3/uL (ref 150–400)
RBC: 4.22 MIL/uL (ref 3.87–5.11)
RDW: 19.2 % — ABNORMAL HIGH (ref 11.5–15.5)
WBC: 8 10*3/uL (ref 4.0–10.5)
nRBC: 0 % (ref 0.0–0.2)

## 2022-09-30 LAB — CULTURE, RESPIRATORY W GRAM STAIN

## 2022-09-30 LAB — MAGNESIUM: Magnesium: 1.8 mg/dL (ref 1.7–2.4)

## 2022-09-30 MED ORDER — PREDNISONE 10 MG (21) PO TBPK: ORAL_TABLET | ORAL | 0 refills | Status: DC

## 2022-09-30 MED ORDER — LEVOFLOXACIN 750 MG PO TABS: 750.0000 mg | ORAL_TABLET | ORAL | 0 refills | Status: DC

## 2022-09-30 MED ORDER — ALPRAZOLAM 0.5 MG PO TABS: 0.5000 mg | ORAL_TABLET | Freq: Three times a day (TID) | ORAL | 0 refills | Status: DC | PRN

## 2022-09-30 MED ORDER — DOCUSATE SODIUM 100 MG PO CAPS: 100.0000 mg | ORAL_CAPSULE | Freq: Two times a day (BID) | ORAL | 0 refills | Status: DC | PRN

## 2022-09-30 NOTE — Consult Note (Signed)
   [facility] Inpatient Consult   09/30/2022  DARIS ARISTIZABAL 27-Aug-1946 [id number]  [facility] [ACO] Patient: Medicare ACO REACH  Primary Care Provider:  [name], MD, [facility] which is listed to provide the transition of care follow up  Patient screened for 4 hospitalization in the past 6 months with noted high risk score for unplanned readmission risk to assess for potential [facility] [employer] service needs for post hospital transition for care coordination.  Review of patient's electronic medical record reveals patient is for potential home.  PT/OT evaluations reviewed and inpatient [facility] team notes, along with md progress notes.  Spoke with patient via hospital phone, HIPAA verified, regarding any post hospital follow up needs.  She states she is awaiting a call from her daughter, [name].  Explained to anticipate a follow up call from MD office for any community needs.  Patient's SDOH screened and states her daughter is her transportation and that she has tried to contact her today but "she may be at her own doctor's appointment but my daughter thought that I should come home on Saturday but the doctor thinks I'm ready, so [name] will call me back." She verifies daughter's contact number is her best contact.  Patient agrees to follow up for readmission prevention.   Plan:    Referral request for community care coordination: [facility] follow up planned  Of note, [place] does not replace or interfere with any arrangements made by the Inpatient Transition of Care team.  For questions contact:   Natividad Brood, RN BSN [place]  [phone] business mobile phone Toll free office [phone]  *[place]  [phone] Fax number: [phone] [email or web address]

## 2022-09-30 NOTE — Progress Notes (Signed)
OT Cancellation Note  Patient Details Name: [name] MRN: [id number] DOB: 09-24-1946   Cancelled Treatment:    Reason Eval/Treat Not Completed: Fatigue/lethargy limiting ability to participate pt greeted supine in bed reporting fatigue from just working with PT, pt declined OOB mobility or ADL participation.   Harley Alto., COTA/L Acute Rehabilitation Services [phone]'  [name] 09/30/2022, 10:37 AM

## 2022-09-30 NOTE — Discharge Summary (Signed)
Physician Discharge Summary  [name] QMG:[id number] DOB: May 03, 1946 DOA: 09/26/2022  PCP: [name], MD  Admit date: 09/26/2022 Discharge date: 09/30/2022  Admitted From: home Discharge disposition: home   Recommendations for Outpatient Follow-Up:   Continue home O2 Decreased dose of xanax Bowel regimen   Discharge Diagnosis:   Active Problems:   Pneumonia    Discharge Condition: Improved.  Diet recommendation: Low sodium, heart healthy.  Carbohydrate-modified.  Regular.  Wound care: None.  Code status: Full.   History of Present Illness:   77 yo F PMH breast ca, Emphysema, chronic hypoxic respiratory failure on 4L, diastolic HF, CAD, HLD, AS, presenting with dry cough, fatigue, chills.  Found to be severely hypoxemic and have acute on chronic hypercapniec respiratory failure.  Refusing BIPAP and having increasing agitation, hyoxemia so PCCM consulted.  Imaging showing RUL infiltrate.  Denies sick contacts.    Hospital Course by Problem:   RUL CAP due to infectious organism - pseudomonas on culture (will treat although probably colonized) -continue abx but change to PO   Acute metabolic encephalopathy  -lives at home -appear resolved with decreased xanax dose   Acute on chronic respiratory failure with hypoxia and hypercapnia (HCC) in setting of advanced COPD -initially went to the ICU. -weaned O2 (wears 3-4L at baseline)-- does not need to be 100%   Chronic diastolic CHF (congestive heart failure) (HCC) Echocardiogram with preserved LV systolic function EF 60 to 65% with mild LVH, preserved RV systolic function, moderate to severe aortic stenosis.  No clinical signs of heart failure exacerbation -outpatient follow up   Constipation -bowel regimen   Chronic benzo use -resume home meds   Poor overall prognosis-- needs continue GOC with PCP    Medical Consultants:   pccm   Discharge Exam:   Vitals:   09/30/22 0300 09/30/22  0400  BP: (!) 156/73 (!) 156/73  Pulse: 78 80  Resp: 14 14  Temp: 98.2 F (36.8 C)   SpO2: 100% 100%   Vitals:   09/30/22 0000 09/30/22 0300 09/30/22 0400 09/30/22 0500  BP: (!) 158/73 (!) 156/73 (!) 156/73   Pulse: 82 78 80   Resp: '15 14 14   '$ Temp: 98 F (36.7 C) 98.2 F (36.8 C)    TempSrc:  Oral    SpO2: 100% 100% 100%   Weight:    49.3 kg  Height:        General exam: Appears calm and comfortable.    The results of significant diagnostics from this hospitalization (including imaging, microbiology, ancillary and laboratory) are listed below for reference.     Procedures and Diagnostic Studies:   VAS [place] LOWER EXTREMITY VENOUS (DVT)  Result Date: 09/27/2022  Lower Venous DVT Study Patient Name:  [name]  Date of Exam:   09/26/2022 Medical Rec #: [id number]        Accession #:    [id number] Date of Birth: 1945/11/04        Patient Gender: F Patient Age:   79 years Exam Location:  [facility] Procedure:      VAS [place] LOWER EXTREMITY VENOUS (DVT) Referring Phys: Ina Homes --------------------------------------------------------------------------------  Indications: Edema.  Comparison Study: no prior Performing Technologist: Archie Patten RVS  Examination Guidelines: A complete evaluation includes B-mode imaging, spectral Doppler, color Doppler, and power Doppler as needed of all accessible portions of each vessel. Bilateral testing is considered an integral part of a complete examination. Limited examinations for reoccurring indications  may be performed as noted. The reflux portion of the exam is performed with the patient in reverse Trendelenburg.  +---------+---------------+---------+-----------+----------+--------------+ RIGHT    CompressibilityPhasicitySpontaneityPropertiesThrombus Aging +---------+---------------+---------+-----------+----------+--------------+ CFV      Full           Yes      Yes                                  +---------+---------------+---------+-----------+----------+--------------+ SFJ      Full                                                        +---------+---------------+---------+-----------+----------+--------------+ FV Prox  Full                                                        +---------+---------------+---------+-----------+----------+--------------+ FV Mid   Full                                                        +---------+---------------+---------+-----------+----------+--------------+ FV DistalFull                                                        +---------+---------------+---------+-----------+----------+--------------+ PFV      Full                                                        +---------+---------------+---------+-----------+----------+--------------+ POP      Full           Yes      Yes                                 +---------+---------------+---------+-----------+----------+--------------+ PTV      Full                                                        +---------+---------------+---------+-----------+----------+--------------+ PERO     Full                                                        +---------+---------------+---------+-----------+----------+--------------+   +---------+---------------+---------+-----------+----------+--------------+ LEFT     CompressibilityPhasicitySpontaneityPropertiesThrombus Aging +---------+---------------+---------+-----------+----------+--------------+ CFV      Full  Yes      Yes                                 +---------+---------------+---------+-----------+----------+--------------+ SFJ      Full                                                        +---------+---------------+---------+-----------+----------+--------------+ FV Prox  Full                                                         +---------+---------------+---------+-----------+----------+--------------+ FV Mid   Full                                                        +---------+---------------+---------+-----------+----------+--------------+ FV DistalFull                                                        +---------+---------------+---------+-----------+----------+--------------+ PFV      Full                                                        +---------+---------------+---------+-----------+----------+--------------+ POP      Full           Yes      Yes                                 +---------+---------------+---------+-----------+----------+--------------+ PTV      Full                                                        +---------+---------------+---------+-----------+----------+--------------+ PERO     Full                                                        +---------+---------------+---------+-----------+----------+--------------+     Summary: BILATERAL: - No evidence of deep vein thrombosis seen in the lower extremities, bilaterally. -No evidence of popliteal cyst, bilaterally.   *See table(s) above for measurements and observations. Electronically signed by Jamelle Haring on 09/27/2022 at 10:17:29 AM.    Final    DG Chest Port 1 View  Result Date: 09/27/2022 CLINICAL DATA:  77 year old female with pneumonia. EXAM: PORTABLE CHEST 1 VIEW  COMPARISON:  PA and lateral chest radiographs 09/26/2022 and earlier. FINDINGS: Portable AP view at 0548 hours. Progressed confluence of right upper lobe consolidation since yesterday. Lobar consolidation now. Right lung base seems to remain spared. Mildly improved lung volumes. Stable cardiomegaly and mediastinal contours. Left lung appears to remain clear. No acute osseous abnormality identified. IMPRESSION: 1. Progressed Lobar Consolidation in the right upper lobe since yesterday. 2. No pleural effusion or other acute cardiopulmonary  abnormality identified. 3. Chronic cardiomegaly. Electronically Signed   By: Genevie Ann M.D.   On: 09/27/2022 06:19   DG Chest 2 View  Result Date: 09/26/2022 CLINICAL DATA:  Shortness of breath. EXAM: CHEST - 2 VIEW COMPARISON:  August 07, 2022 FINDINGS: The cardiac silhouette is mildly enlarged and unchanged in size. There is marked severity calcification of the aortic arch. Marked severity patchy infiltrate is seen within the right upper lobe. Mild right basilar atelectasis and/or infiltrate is also seen. There is a small right pleural effusion. No pneumothorax is identified. Radiopaque surgical clips are seen within the right upper quadrant. The visualized skeletal structures are unremarkable. IMPRESSION: 1. Marked severity right upper lobe infiltrate. 2. Mild right basilar atelectasis and/or infiltrate. 3. Small right pleural effusion. Electronically Signed   By: Virgina Norfolk M.D.   On: 09/26/2022 04:24     Labs:   Basic Metabolic Panel: Recent Labs  Lab 09/26/22 0351 09/26/22 0357 09/27/22 0838 09/28/22 0436 09/29/22 0450 09/30/22 0345  NA 138 138 137 136 136 136  K 4.1 4.1 4.3 4.2 4.6 4.4  CL 99  --  95* 95* 95* 96*  CO2 33*  --  34* 34* 32 31  GLUCOSE 105*  --  103* 86 86 81  BUN 15  --  '12 8 11 13  '$ CREATININE 1.00  --  0.78 0.69 0.68 0.74  CALCIUM 8.5*  --  8.4* 8.6* 8.5* 8.9  MG  --   --  2.2 2.1 1.9 1.8   GFR Estimated Creatinine Clearance: 46.6 mL/min (by C-G formula based on SCr of 0.74 mg/dL). Liver Function Tests: No results for input(s): "AST", "ALT", "ALKPHOS", "BILITOT", "PROT", "ALBUMIN" in the last 168 hours. No results for input(s): "LIPASE", "AMYLASE" in the last 168 hours. No results for input(s): "AMMONIA" in the last 168 hours. Coagulation profile No results for input(s): "INR", "PROTIME" in the last 168 hours.  CBC: Recent Labs  Lab 09/26/22 0351 09/26/22 0357 09/27/22 0427 09/28/22 0436 09/29/22 0450 09/30/22 0345  WBC 9.4  --  13.0* 9.7  8.7 8.0  NEUTROABS 8.0*  --   --   --   --   --   HGB 10.6* 11.9* 11.3* 10.1* 10.1* 11.7*  HCT 36.4 35.0* 39.7 34.2* 34.2* 39.3  MCV 97.8  --  98.8 95.3 94.2 93.1  PLT 227  --  198 220 226 243   Cardiac Enzymes: No results for input(s): "CKTOTAL", "CKMB", "CKMBINDEX", "TROPONINI" in the last 168 hours. BNP: Invalid input(s): "POCBNP" CBG: Recent Labs  Lab 09/26/22 1116 09/26/22 1208 09/26/22 1546 09/26/22 2006 09/26/22 2008  GLUCAP 69* 69* 140* 62* 81   D-Dimer No results for input(s): "DDIMER" in the last 72 hours. Hgb A1c No results for input(s): "HGBA1C" in the last 72 hours. Lipid Profile No results for input(s): "CHOL", "HDL", "LDLCALC", "TRIG", "CHOLHDL", "LDLDIRECT" in the last 72 hours. Thyroid function studies No results for input(s): "TSH", "T4TOTAL", "T3FREE", "THYROIDAB" in the last 72 hours.  Invalid input(s): "FREET3" Anemia work up No results for  input(s): "VITAMINB12", "FOLATE", "FERRITIN", "TIBC", "IRON", "RETICCTPCT" in the last 72 hours. Microbiology Recent Results (from the past 240 hour(s))  Resp panel by RT-PCR (RSV, Flu A&B, Covid) Anterior Nasal Swab     Status: None   Collection Time: 09/26/22  6:52 AM   Specimen: Anterior Nasal Swab  Result Value Ref Range Status   SARS Coronavirus 2 by RT PCR NEGATIVE NEGATIVE Final    Comment: (NOTE) SARS-CoV-2 target nucleic acids are NOT DETECTED.  The SARS-CoV-2 RNA is generally detectable in upper respiratory specimens during the acute phase of infection. The lowest concentration of SARS-CoV-2 viral copies this assay can detect is 138 copies/mL. A negative result does not preclude SARS-Cov-2 infection and should not be used as the sole basis for treatment or other patient management decisions. A negative result may occur with  improper specimen collection/handling, submission of specimen other than nasopharyngeal swab, presence of viral mutation(s) within the areas targeted by this assay, and  inadequate number of viral copies(<138 copies/mL). A negative result must be combined with clinical observations, patient history, and epidemiological information. The expected result is Negative.  Fact Sheet for Patients:  EntrepreneurPulse.com.au  Fact Sheet for Healthcare Providers:  IncredibleEmployment.be  This test is no t yet approved or cleared by the [place] FDA and  has been authorized for detection and/or diagnosis of SARS-CoV-2 by FDA under an Emergency Use Authorization (EUA). This EUA will remain  in effect (meaning this test can be used) for the duration of the COVID-19 declaration under Section 564(b)(1) of the Act, 21 U.S.C.section 360bbb-3(b)(1), unless the authorization is terminated  or revoked sooner.       Influenza A by PCR NEGATIVE NEGATIVE Final   Influenza B by PCR NEGATIVE NEGATIVE Final    Comment: (NOTE) The Xpert Xpress SARS-CoV-2/FLU/RSV plus assay is intended as an aid in the diagnosis of influenza from Nasopharyngeal swab specimens and should not be used as a sole basis for treatment. Nasal washings and aspirates are unacceptable for Xpert Xpress SARS-CoV-2/FLU/RSV testing.  Fact Sheet for Patients: EntrepreneurPulse.com.au  Fact Sheet for Healthcare Providers: IncredibleEmployment.be  This test is not yet approved or cleared by the [place] FDA and has been authorized for detection and/or diagnosis of SARS-CoV-2 by FDA under an Emergency Use Authorization (EUA). This EUA will remain in effect (meaning this test can be used) for the duration of the COVID-19 declaration under Section 564(b)(1) of the Act, 21 U.S.C. section 360bbb-3(b)(1), unless the authorization is terminated or revoked.     Resp Syncytial Virus by PCR NEGATIVE NEGATIVE Final    Comment: (NOTE) Fact Sheet for Patients: EntrepreneurPulse.com.au  Fact Sheet for Healthcare  Providers: IncredibleEmployment.be  This test is not yet approved or cleared by the [place] FDA and has been authorized for detection and/or diagnosis of SARS-CoV-2 by FDA under an Emergency Use Authorization (EUA). This EUA will remain in effect (meaning this test can be used) for the duration of the COVID-19 declaration under Section 564(b)(1) of the Act, 21 U.S.C. section 360bbb-3(b)(1), unless the authorization is terminated or revoked.  Performed at [facility], [place] [address], [place], [place] [address]   Culture, blood (routine x 2)     Status: None (Preliminary result)   Collection Time: 09/26/22  9:50 AM   Specimen: BLOOD  Result Value Ref Range Status   Specimen Description BLOOD SITE NOT SPECIFIED  Final   Special Requests BOTTLES DRAWN AEROBIC AND ANAEROBIC  Final   Culture   Final  NO GROWTH 4 DAYS Performed at [facility], [place] [address], [place], [place] [address]    Report Status PENDING  Incomplete  Culture, blood (routine x 2)     Status: None (Preliminary result)   Collection Time: 09/26/22  9:50 AM   Specimen: BLOOD  Result Value Ref Range Status   Specimen Description BLOOD SITE NOT SPECIFIED  Final   Special Requests BOTTLES DRAWN AEROBIC AND ANAEROBIC  Final   Culture   Final    NO GROWTH 4 DAYS Performed at [facility], [place] [address], [place], [place] [address]    Report Status PENDING  Incomplete  MRSA Next Gen by PCR, Nasal     Status: None   Collection Time: 09/26/22 10:42 AM  Result Value Ref Range Status   MRSA by PCR Next Gen NOT DETECTED NOT DETECTED Final    Comment: (NOTE) The GeneXpert MRSA Assay (FDA approved for NASAL specimens only), is one component of a comprehensive MRSA colonization surveillance program. It is not intended to diagnose MRSA infection nor to guide or monitor treatment for MRSA infections. Test performance is not FDA approved in patients less than 66  years old. Performed at [facility], [place] [address], [place], [place] [address]   Respiratory (~20 pathogens) panel by PCR     Status: None   Collection Time: 09/26/22 11:50 AM   Specimen: Nasopharyngeal Swab; Respiratory  Result Value Ref Range Status   Adenovirus NOT DETECTED NOT DETECTED Final   Coronavirus 229E NOT DETECTED NOT DETECTED Final    Comment: (NOTE) The Coronavirus on the Respiratory Panel, DOES NOT test for the novel  Coronavirus (2019 nCoV)    Coronavirus HKU1 NOT DETECTED NOT DETECTED Final   Coronavirus NL63 NOT DETECTED NOT DETECTED Final   Coronavirus OC43 NOT DETECTED NOT DETECTED Final   Metapneumovirus NOT DETECTED NOT DETECTED Final   Rhinovirus / Enterovirus NOT DETECTED NOT DETECTED Final   Influenza A NOT DETECTED NOT DETECTED Final   Influenza B NOT DETECTED NOT DETECTED Final   Parainfluenza Virus 1 NOT DETECTED NOT DETECTED Final   Parainfluenza Virus 2 NOT DETECTED NOT DETECTED Final   Parainfluenza Virus 3 NOT DETECTED NOT DETECTED Final   Parainfluenza Virus 4 NOT DETECTED NOT DETECTED Final   Respiratory Syncytial Virus NOT DETECTED NOT DETECTED Final   Bordetella pertussis NOT DETECTED NOT DETECTED Final   Bordetella Parapertussis NOT DETECTED NOT DETECTED Final   Chlamydophila pneumoniae NOT DETECTED NOT DETECTED Final   Mycoplasma pneumoniae NOT DETECTED NOT DETECTED Final    Comment: Performed at [facility], [place]. [address], [place], [place] [address]  Expectorated Sputum Assessment w Gram Stain, Rflx to Resp Cult     Status: None   Collection Time: 09/26/22  6:40 PM   Specimen: Expectorated Sputum  Result Value Ref Range Status   Specimen Description EXPECTORATED SPUTUM  Final   Special Requests NONE  Final   Sputum evaluation   Final    THIS SPECIMEN IS ACCEPTABLE FOR SPUTUM CULTURE Performed at [facility], [place] [address] [place], [place] [address]    Report Status 09/26/2022 FINAL  Final  Culture, Respiratory  w Gram Stain     Status: None   Collection Time: 09/26/22  6:40 PM  Result Value Ref Range Status   Specimen Description EXPECTORATED SPUTUM  Final   Special Requests NONE Reflexed from [id number]  Final   Gram Stain   Final  RARE WBC PRESENT, PREDOMINANTLY PMN NO ORGANISMS SEEN Performed at [place] [address], [place], [place] [address]    Culture RARE PSEUDOMONAS AERUGINOSA  Final   Report Status 09/30/2022 FINAL  Final   Organism ID, Bacteria PSEUDOMONAS AERUGINOSA  Final      Susceptibility   Pseudomonas aeruginosa - MIC*    CEFTAZIDIME 4 SENSITIVE Sensitive     CIPROFLOXACIN <=0.25 SENSITIVE Sensitive     GENTAMICIN 8 INTERMEDIATE Intermediate     IMIPENEM <=0.25 SENSITIVE Sensitive     PIP/TAZO 8 SENSITIVE Sensitive     * RARE PSEUDOMONAS AERUGINOSA     Discharge Instructions:   Discharge Instructions     Diet - low sodium heart healthy   Complete by: As directed    Increase activity slowly   Complete by: As directed    No wound care   Complete by: As directed       Allergies as of 09/30/2022       Reactions   Ceftriaxone Anaphylaxis, Swelling, Other (See Comments)   *ROCEPHIN* - "Blew up like a balloon"   Hydroxyzine Shortness Of Breath, Other (See Comments)   Pt states med make her light headed, get sob sxs   Other Shortness Of Breath, Other (See Comments)   Unnamed antibiotic for an ear infection- Extreme dizziness, also   Doxycycline Nausea And Vomiting   Lexapro [escitalopram] Other (See Comments)   Pt states med make her dizzy        Medication List     TAKE these medications    ACCRUFeR 30 MG Caps Generic drug: Ferric Maltol TAKE 1 CAPSULE BY MOUTH EVERY DAY IN THE MORNING AND AT BEDTIME What changed: See the new instructions.   albuterol 108 (90 Base) MCG/ACT inhaler Commonly known as: Ventolin HFA Inhale 2 puffs into the lungs every 4 (four) hours as needed for wheezing or shortness of breath.   ALPRAZolam 0.5 MG  tablet Commonly known as: XANAX Take 1 tablet (0.5 mg total) by mouth 3 (three) times daily as needed for anxiety. What changed:  medication strength how much to take   ascorbic acid 500 MG tablet Commonly known as: VITAMIN C Take 500 mg by mouth daily.   aspirin EC 325 MG tablet Take 325 mg by mouth in the morning.   Atrovent HFA 17 MCG/ACT inhaler Generic drug: ipratropium INHALE 2 PUFFS INTO THE LUNGS EVERY 4 HOURS AS NEEDED FOR WHEEZE What changed: See the new instructions.   butalbital-acetaminophen-caffeine 50-325-40 MG tablet Commonly known as: FIORICET Take 1 tablet by mouth every 4 (four) hours as needed for headache.   cyanocobalamin 1000 MCG tablet Commonly known as: VITAMIN B12 TAKE 1 TABLET BY MOUTH EVERY DAY   denosumab 60 MG/ML Sosy injection Commonly known as: [name] Inject 60 mg into the skin every 6 (six) months.   diclofenac Sodium 1 % Gel Commonly known as: VOLTAREN APPLY 2 GRAMS TO AFFECTED AREA 4 TIMES A DAY What changed: See the new instructions.   docusate sodium 100 MG capsule Commonly known as: COLACE Take 1 capsule (100 mg total) by mouth 2 (two) times daily as needed for mild constipation.   Dulera 200-5 MCG/ACT Aero Generic drug: mometasone-formoterol INHALE 2 PUFFS INTO THE LUNGS TWICE A DAY What changed: See the new instructions.   Enulose 10 GM/15ML Soln Generic drug: lactulose (encephalopathy) Take 45 mLs (30 g total) by mouth 3 (three) times daily as needed (constipation). What changed:  when to take this  reasons to take this   furosemide 40 MG tablet Commonly known as: LASIX Take 1 tablet (40 mg total) by mouth 2 (two) times daily.   ipratropium-albuterol 0.5-2.5 (3) MG/3ML Soln Commonly known as: DUONEB Inhale 3 mLs into the lungs every 4 (four) hours as needed. Nebulize 3 ml's and inhale into the lungs one to two times a day What changed:  reasons to take this additional instructions   Klor-Con M20 20 MEQ  tablet Generic drug: potassium chloride SA TAKE 1 TABLET BY MOUTH EVERY DAY What changed: how much to take   levocetirizine 5 MG tablet Commonly known as: XYZAL TAKE 1 TABLET BY MOUTH EVERY DAY IN THE EVENING What changed:  how much to take how to take this   levofloxacin 750 MG tablet Commonly known as: LEVAQUIN Take 1 tablet (750 mg total) by mouth every other day. Start taking on: October 01, 2022   linaclotide 72 MCG capsule Commonly known as: Linzess Take 1 capsule (72 mcg total) by mouth daily before breakfast.   Livalo 2 MG Tabs Generic drug: Pitavastatin Calcium TAKE 1 TABLET BY MOUTH EVERY DAY What changed:  how much to take when to take this   meclizine 25 MG tablet Commonly known as: ANTIVERT Take 12.5-25 mg by mouth 3 (three) times daily as needed for dizziness.   montelukast 10 MG tablet Commonly known as: SINGULAIR TAKE 1 TABLET BY MOUTH EVERYDAY AT BEDTIME What changed: See the new instructions.   nortriptyline 25 MG capsule Commonly known as: PAMELOR TAKE 2 CAPSULES BY MOUTH AT BEDTIME   Omega 3 1200 MG Caps Take 1,200 mg by mouth daily.   OXYGEN Inhale 3 L/min into the lungs See admin instructions. 3 L/min at bedtime and during the day as needed for shortness of breath   pantoprazole 40 MG tablet Commonly known as: PROTONIX TAKE 1 TABLET BY MOUTH EVERY DAY What changed: when to take this   polyvinyl alcohol 1.4 % ophthalmic solution Commonly known as: LIQUIFILM TEARS Place 1 drop into both eyes 3 (three) times daily as needed for dry eyes.   predniSONE 10 MG (21) Tbpk tablet Commonly known as: STERAPRED UNI-PAK 21 TAB Please use per package instruction   Vitamin D-3 25 MCG (1000 UT) Caps Take 1,000 Units by mouth daily.          Time coordinating discharge: 45 min  Signed:  Geradine Girt DO  [facility] 09/30/2022, 10:38 AM

## 2022-09-30 NOTE — Progress Notes (Signed)
Physical Therapy Treatment Patient Details Name: [name] MRN: [id number] DOB: [birth year] Today's Date: 09/30/2022   History of Present Illness 77 y.o. female presents to [facility] hospital on 09/26/2022 with hypoxemia, found to be in acute on chronic hypercapnic respiratory failure. Imaging demonstrates RUL CAP. PMH - copd, diastolic heart failure, HTN, CAD, breast CA, L1 compression fx.    PT Comments    Pt progressing well with mobility. Supervision bed mobility, transfers, and amb 250' with RW. Pt on 3L at rest and mobilized on 4L. SpO2 stable. No SOB/DOE noted, pt able to converse throughout mobility without difficulty. Pt returned to bed at end of session.    Recommendations for follow up therapy are one component of a multi-disciplinary discharge planning process, led by the attending physician.  Recommendations may be updated based on patient status, additional functional criteria and insurance authorization.  Follow Up Recommendations  No PT follow up     Assistance Recommended at Discharge Intermittent Supervision/Assistance  Patient can return home with the following A little help with bathing/dressing/bathroom;Assistance with cooking/housework;Direct supervision/assist for medications management;Direct supervision/assist for financial management;Assist for transportation;Help with stairs or ramp for entrance   Equipment Recommendations  None recommended by PT    Recommendations for Other Services       Precautions / Restrictions Precautions Precautions: Fall;Other (comment) Precaution Comments: incontinent of stool Restrictions Weight Bearing Restrictions: No     Mobility  Bed Mobility Overal bed mobility: Needs Assistance Bed Mobility: Supine to Sit, Sit to Supine     Supine to sit: Supervision Sit to supine: Supervision   General bed mobility comments: safety/lines    Transfers Overall transfer level: Needs assistance Equipment used: Rolling walker (2  wheels) Transfers: Sit to/from Stand Sit to Stand: Supervision           General transfer comment: supervision for safety/lines    Ambulation/Gait Ambulation/Gait assistance: Supervision Gait Distance (Feet): 250 Feet Assistive device: Rolling walker (2 wheels) Gait Pattern/deviations: Step-through pattern Gait velocity: decreased Gait velocity interpretation: <1.31 ft/sec, indicative of household ambulator   General Gait Details: steady step-through gait. Mobilized on 4L with stable SpO2. No SOB/DOE.   Stairs             Wheelchair Mobility    Modified Rankin (Stroke Patients Only)       Balance Overall balance assessment: Needs assistance Sitting-balance support: No upper extremity supported, Feet supported Sitting balance-Leahy Scale: Good     Standing balance support: Reliant on assistive device for balance, Bilateral upper extremity supported, During functional activity, No upper extremity supported Standing balance-Leahy Scale: Fair Standing balance comment: static stand without UE support, RW for amb                            Cognition Arousal/Alertness: Awake/alert Behavior During Therapy: WFL for tasks assessed/performed Overall Cognitive Status: No family/caregiver present to determine baseline cognitive functioning Area of Impairment: Memory, Safety/judgement, Awareness, Problem solving                     Memory: Decreased short-term memory   Safety/Judgement: Decreased awareness of deficits Awareness: Emergent Problem Solving: Requires verbal cues          Exercises      General Comments General comments (skin integrity, edema, etc.): 3L at rest. 4L for amb      Pertinent Vitals/Pain Pain Assessment Pain Assessment: No/denies pain    Home Living  Prior Function            PT Goals (current goals can now be found in the care plan section) Acute Rehab PT Goals Patient  Stated Goal: home Progress towards PT goals: Progressing toward goals    Frequency    Min 3X/week      PT Plan Current plan remains appropriate    Co-evaluation              AM-PAC PT "6 Clicks" Mobility   Outcome Measure  Help needed turning from your back to your side while in a flat bed without using bedrails?: None Help needed moving from lying on your back to sitting on the side of a flat bed without using bedrails?: A Little Help needed moving to and from a bed to a chair (including a wheelchair)?: A Little Help needed standing up from a chair using your arms (e.g., wheelchair or bedside chair)?: A Little Help needed to walk in hospital room?: A Little Help needed climbing 3-5 steps with a railing? : A Little 6 Click Score: 19    End of Session Equipment Utilized During Treatment: Oxygen;Gait belt Activity Tolerance: Patient tolerated treatment well Patient left: in bed;with call bell/phone within reach Nurse Communication: Mobility status PT Visit Diagnosis: Other abnormalities of gait and mobility (R26.89)     Time: 4401-0272 PT Time Calculation (min) (ACUTE ONLY): 24 min  Charges:  $Gait Training: 23-37 mins                     Lorrin Goodell, PT  Office # [phone] Pager [phone]    Lorriane Shire 09/30/2022, 10:51 AM

## 2022-09-30 NOTE — Plan of Care (Signed)

## 2022-10-01 ENCOUNTER — Telehealth: Payer: Self-pay | Admitting: *Deleted

## 2022-10-01 ENCOUNTER — Telehealth: Payer: Self-pay

## 2022-10-01 ENCOUNTER — Encounter: Payer: Self-pay | Admitting: *Deleted

## 2022-10-01 ENCOUNTER — Other Ambulatory Visit: Payer: Self-pay | Admitting: Internal Medicine

## 2022-10-01 ENCOUNTER — Other Ambulatory Visit: Payer: Self-pay | Admitting: Pulmonary Disease

## 2022-10-01 DIAGNOSIS — J9611 Chronic respiratory failure with hypoxia: Secondary | ICD-10-CM

## 2022-10-01 DIAGNOSIS — G43009 Migraine without aura, not intractable, without status migrainosus: Secondary | ICD-10-CM

## 2022-10-01 DIAGNOSIS — J438 Other emphysema: Secondary | ICD-10-CM

## 2022-10-01 LAB — CULTURE, BLOOD (ROUTINE X 2)
Culture: NO GROWTH
Culture: NO GROWTH

## 2022-10-01 NOTE — Telephone Encounter (Signed)
[name] arrived today from speciality pharmacy.  Left message for patient to contact office for next injection.   She is due for next shot 11/30/2022.  Last shot given was 06/01/22.

## 2022-10-01 NOTE — Patient Outreach (Signed)
  Care Coordination [facility] Note Transition Care Management Unsuccessful Follow-up Telephone Call  Date of discharge and from where:  Thursday, 09/30/22 [facility]; pneumonia  Attempts:  1st Attempt  Reason for unsuccessful TCM follow-up call:  Left voice message  Oneta Rack, RN, BSN, CCRN Alumnus RN CM Care Coordination/ Transition of [place] Management [phone]: direct office

## 2022-10-03 ENCOUNTER — Emergency Department (HOSPITAL_COMMUNITY)
Admission: EM | Admit: 2022-10-03 | Discharge: 2022-10-04 | Disposition: A | Discharge status: Home / Self Care | Payer: Medicare Other | Attending: Emergency Medicine | Admitting: Emergency Medicine

## 2022-10-03 ENCOUNTER — Other Ambulatory Visit: Payer: Self-pay

## 2022-10-03 ENCOUNTER — Emergency Department (HOSPITAL_COMMUNITY): Payer: Medicare Other

## 2022-10-03 ENCOUNTER — Encounter (HOSPITAL_COMMUNITY): Payer: Self-pay

## 2022-10-03 DIAGNOSIS — Z853 Personal history of malignant neoplasm of breast: Secondary | ICD-10-CM | POA: Insufficient documentation

## 2022-10-03 DIAGNOSIS — R531 Weakness: Secondary | ICD-10-CM | POA: Diagnosis not present

## 2022-10-03 DIAGNOSIS — J9611 Chronic respiratory failure with hypoxia: Secondary | ICD-10-CM | POA: Diagnosis not present

## 2022-10-03 DIAGNOSIS — I5030 Unspecified diastolic (congestive) heart failure: Secondary | ICD-10-CM | POA: Insufficient documentation

## 2022-10-03 DIAGNOSIS — T7691XA Unspecified adult maltreatment, suspected, initial encounter: Secondary | ICD-10-CM | POA: Diagnosis not present

## 2022-10-03 DIAGNOSIS — R0902 Hypoxemia: Secondary | ICD-10-CM

## 2022-10-03 DIAGNOSIS — I5032 Chronic diastolic (congestive) heart failure: Secondary | ICD-10-CM | POA: Diagnosis not present

## 2022-10-03 DIAGNOSIS — J9612 Chronic respiratory failure with hypercapnia: Secondary | ICD-10-CM | POA: Diagnosis not present

## 2022-10-03 DIAGNOSIS — Z7982 Long term (current) use of aspirin: Secondary | ICD-10-CM | POA: Insufficient documentation

## 2022-10-03 DIAGNOSIS — R0602 Shortness of breath: Secondary | ICD-10-CM | POA: Diagnosis not present

## 2022-10-03 DIAGNOSIS — J438 Other emphysema: Secondary | ICD-10-CM

## 2022-10-03 DIAGNOSIS — J449 Chronic obstructive pulmonary disease, unspecified: Secondary | ICD-10-CM

## 2022-10-03 LAB — COMPREHENSIVE METABOLIC PANEL
ALT: 20 U/L (ref 0–44)
AST: 23 U/L (ref 15–41)
Albumin: 3.2 g/dL — ABNORMAL LOW (ref 3.5–5.0)
Alkaline Phosphatase: 61 U/L (ref 38–126)
Anion gap: 12 (ref 5–15)
BUN: 14 mg/dL (ref 8–23)
CO2: 38 mmol/L — ABNORMAL HIGH (ref 22–32)
Calcium: 8.9 mg/dL (ref 8.9–10.3)
Chloride: 88 mmol/L — ABNORMAL LOW (ref 98–111)
Creatinine, Ser: 0.88 mg/dL (ref 0.44–1.00)
GFR, Estimated: >60 mL/min (ref >60)
Glucose, Bld: 88 mg/dL (ref 70–99)
Potassium: 4.1 mmol/L (ref 3.5–5.1)
Sodium: 138 mmol/L (ref 135–145)
Total Bilirubin: 0.3 mg/dL (ref 0.3–1.2)
Total Protein: 6.7 g/dL (ref 6.5–8.1)

## 2022-10-03 LAB — I-STAT CHEM 8, ED
BUN: 17 mg/dL (ref 8–23)
Calcium, Ion: 1.09 mmol/L — ABNORMAL LOW (ref 1.15–1.40)
Chloride: 88 mmol/L — ABNORMAL LOW (ref 98–111)
Creatinine, Ser: 0.9 mg/dL (ref 0.44–1.00)
Glucose, Bld: 85 mg/dL (ref 70–99)
HCT: 44 % (ref 36.0–46.0)
Hemoglobin: 15 g/dL (ref 12.0–15.0)
Potassium: 4.1 mmol/L (ref 3.5–5.1)
Sodium: 137 mmol/L (ref 135–145)
TCO2: 43 mmol/L — ABNORMAL HIGH (ref 22–32)

## 2022-10-03 LAB — I-STAT VENOUS BLOOD GAS, ED
Acid-Base Excess: 17 mmol/L — ABNORMAL HIGH (ref 0.0–2.0)
Bicarbonate: 47 mmol/L — ABNORMAL HIGH (ref 20.0–28.0)
Calcium, Ion: 1.09 mmol/L — ABNORMAL LOW (ref 1.15–1.40)
HCT: 43 % (ref 36.0–46.0)
Hemoglobin: 14.6 g/dL (ref 12.0–15.0)
O2 Saturation: 29 %
Potassium: 4.1 mmol/L (ref 3.5–5.1)
Sodium: 137 mmol/L (ref 135–145)
TCO2: 49 mmol/L — ABNORMAL HIGH (ref 22–32)
pCO2, Ven: 79.6 mmHg (ref 44–60)
pH, Ven: 7.379 (ref 7.25–7.43)
pO2, Ven: 21 mmHg — CL (ref 32–45)

## 2022-10-03 LAB — CBC WITH DIFFERENTIAL/PLATELET
Abs Immature Granulocytes: 0.06 10*3/uL (ref 0.00–0.07)
Basophils Absolute: 0 10*3/uL (ref 0.0–0.1)
Basophils Relative: 1 %
Eosinophils Absolute: 0.1 10*3/uL (ref 0.0–0.5)
Eosinophils Relative: 1 %
HCT: 42.9 % (ref 36.0–46.0)
Hemoglobin: 12.2 g/dL (ref 12.0–15.0)
Immature Granulocytes: 1 %
Lymphocytes Relative: 17 %
Lymphs Abs: 1 10*3/uL (ref 0.7–4.0)
MCH: 28 pg (ref 26.0–34.0)
MCHC: 28.4 g/dL — ABNORMAL LOW (ref 30.0–36.0)
MCV: 98.6 fL (ref 80.0–100.0)
Monocytes Absolute: 0.4 10*3/uL (ref 0.1–1.0)
Monocytes Relative: 7 %
Neutro Abs: 4.4 10*3/uL (ref 1.7–7.7)
Neutrophils Relative %: 73 %
Platelets: 277 10*3/uL (ref 150–400)
RBC: 4.35 MIL/uL (ref 3.87–5.11)
RDW: 18.9 % — ABNORMAL HIGH (ref 11.5–15.5)
WBC: 6 10*3/uL (ref 4.0–10.5)
nRBC: 0 % (ref 0.0–0.2)

## 2022-10-03 LAB — TROPONIN I (HIGH SENSITIVITY)
Troponin I (High Sensitivity): 21 ng/L — ABNORMAL HIGH (ref <18)
Troponin I (High Sensitivity): 18 ng/L — ABNORMAL HIGH (ref <18)

## 2022-10-03 LAB — BRAIN NATRIURETIC PEPTIDE: B Natriuretic Peptide: 118.3 pg/mL — ABNORMAL HIGH (ref 0.0–100.0)

## 2022-10-03 MED ORDER — POTASSIUM CHLORIDE CRYS ER 20 MEQ PO TBCR: 20.0000 meq | EXTENDED_RELEASE_TABLET | Freq: Every day | ORAL | Status: DC

## 2022-10-03 MED ORDER — MONTELUKAST SODIUM 10 MG PO TABS: 10.0000 mg | ORAL_TABLET | Freq: Every day | ORAL | Status: DC

## 2022-10-03 MED ORDER — IPRATROPIUM BROMIDE HFA 17 MCG/ACT IN AERS: 1.0000 | INHALATION_SPRAY | Freq: Four times a day (QID) | RESPIRATORY_TRACT | Status: DC | PRN

## 2022-10-03 MED ORDER — PRAVASTATIN SODIUM 40 MG PO TABS: 40.0000 mg | ORAL_TABLET | Freq: Every day | ORAL | Status: DC

## 2022-10-03 MED ORDER — ASPIRIN 325 MG PO TBEC: 325.0000 mg | DELAYED_RELEASE_TABLET | Freq: Every morning | ORAL | Status: DC

## 2022-10-03 MED ORDER — LORATADINE 10 MG PO TABS: 10.0000 mg | ORAL_TABLET | Freq: Every evening | ORAL | Status: DC

## 2022-10-03 MED ORDER — FUROSEMIDE 20 MG PO TABS: 40.0000 mg | ORAL_TABLET | Freq: Two times a day (BID) | ORAL | Status: DC

## 2022-10-03 MED ORDER — MECLIZINE HCL 25 MG PO TABS: 12.5000 mg | ORAL_TABLET | Freq: Three times a day (TID) | ORAL | Status: DC | PRN

## 2022-10-03 MED ORDER — FUROSEMIDE 10 MG/ML IJ SOLN
100.0000 mg | Freq: Once | INTRAVENOUS | Status: AC
  Administered 2022-10-03: 100 mg via INTRAVENOUS
  Filled 2022-10-03: qty 10

## 2022-10-03 MED ORDER — IPRATROPIUM-ALBUTEROL 0.5-2.5 (3) MG/3ML IN SOLN: 3.0000 mL | RESPIRATORY_TRACT | 1 refills | Status: DC | PRN

## 2022-10-03 MED ORDER — LINACLOTIDE 72 MCG PO CAPS
72.0000 ug | ORAL_CAPSULE | Freq: Every day | ORAL | Status: DC
  Filled 2022-10-03: qty 1

## 2022-10-03 MED ORDER — ALBUTEROL SULFATE HFA 108 (90 BASE) MCG/ACT IN AERS: 2.0000 | INHALATION_SPRAY | RESPIRATORY_TRACT | 5 refills | Status: DC | PRN

## 2022-10-03 MED ORDER — NORTRIPTYLINE HCL 25 MG PO CAPS
50.0000 mg | ORAL_CAPSULE | Freq: Every day | ORAL | Status: DC
  Filled 2022-10-03: qty 2

## 2022-10-03 MED ORDER — PANTOPRAZOLE SODIUM 40 MG PO TBEC: 40.0000 mg | DELAYED_RELEASE_TABLET | Freq: Every day | ORAL | Status: DC

## 2022-10-03 MED ORDER — MOMETASONE FURO-FORMOTEROL FUM 200-5 MCG/ACT IN AERO: 2.0000 | INHALATION_SPRAY | Freq: Two times a day (BID) | RESPIRATORY_TRACT | Status: DC

## 2022-10-03 MED ORDER — IPRATROPIUM-ALBUTEROL 0.5-2.5 (3) MG/3ML IN SOLN: 3.0000 mL | RESPIRATORY_TRACT | Status: DC | PRN

## 2022-10-03 MED ORDER — POLYVINYL ALCOHOL 1.4 % OP SOLN: 1.0000 [drp] | Freq: Three times a day (TID) | OPHTHALMIC | Status: DC | PRN

## 2022-10-03 MED ORDER — ALBUTEROL SULFATE HFA 108 (90 BASE) MCG/ACT IN AERS: 2.0000 | INHALATION_SPRAY | RESPIRATORY_TRACT | Status: DC | PRN

## 2022-10-03 MED ORDER — FUROSEMIDE 40 MG PO TABS: 40.0000 mg | ORAL_TABLET | Freq: Two times a day (BID) | ORAL | 0 refills | Status: DC

## 2022-10-03 NOTE — ED Triage Notes (Signed)
Pt BIBGEMS for shortness of breath, room air at 82%. Two Duo-nebs given en route. Pt is alert and oriented. C/o generalized weakness and a hard time breathing. Pt just d/c yesterday, got one dose of abx for home.  Rr 40 100% on nebulizer treatment  Rales in bilateral bases per EMS

## 2022-10-03 NOTE — Discharge Instructions (Addendum)
[name]:  Thank you for allowing [place] to take care of you today.  We hope you begin feeling better soon. You were seen today for shortness of breath.  This is likely because you are not using your oxygen at home.  We evaluated your labs which all look okay.  Your chest x-ray shows a resolving pneumonia.  Your EKG looks good too.  Ultimately, YOU NEED TO USE YOUR HOME OXYGEN.  This is the biggest thing that is going to help keep you out of the hospital.  In addition, use your home diuretic and albuterol.  I have refilled those and sent them to your pharmacy in case you are out.  To-Do:  Please follow-up with your primary doctor within the next 2-3 days. It is important that you review any labs or imaging results (if any) that you had today with them. Your preliminary imaging results (if any) are attached. Please return to the Emergency Department or call 911 if you experience chest pain, shortness of breath, severe pain, severe fever, altered mental status, or have any reason to think that you need emergency medical care.  Thank you again.  Hope you feel better soon.  Phyllis Ginger, MD Department of Emergency Medicine

## 2022-10-03 NOTE — ED Notes (Signed)
Daughter was called to pick up pt d/t discharge. Daughter refused to come and get her and then turned off her phone. She told me that her mom could stay in a room in the ER until the morning and I told her no ma'am it doesn't work that way. Pt went through her purse looking for money to get home. After 45 minutes I will send pt home on a taxi.

## 2022-10-03 NOTE — ED Notes (Signed)
Pt keeps removing her oxygen

## 2022-10-03 NOTE — ED Provider Notes (Signed)
[facility] EMERGENCY DEPARTMENT Provider Note   CSN: [id number] Arrival date & time: 10/03/22  1842     History  Chief Complaint  Patient presents with   Shortness of Breath    [name] is a 77 y.o. female.  Past medical history of breast cancer, emphysema, chronic hypoxic respiratory failure on 4 L nasal cannula, diastolic heart failure, CAD, hyperlipidemia, aortic stenosis.  Presents to the emergency department today for shortness of breath and respiratory stress with hypoxia at home.  Was just discharged from the hospital 3 days ago for an admission for right upper lobe pneumonia which was treated with Levaquin, metabolic encephalopathy believed to be from a combination of COPD and Xanax, hypoxic and hypercarbic respiratory failure requiring ICU admission and intubation because she cannot tolerate BiPAP.  Has been home over the past couple of days, did take her Levaquin.  Is not compliant with her home oxygen requirement of 3 to 4 L, was having respiratory distress, on EMS arrival had oxygen saturation of 82% on room air.  Was given 2 DuoNebs, oxygen improved to 100%.  Work of breathing initially 40, improved after nebulizer treatment.  She is complaining of generalized weakness and a hard time breathing.  Denies chest pain or abdominal pain.  History of breast cancer, has had chemotherapy and multiple surgeries, but none in the near past.  HPI     Home Medications Prior to Admission medications   Medication Sig Start Date End Date Taking? Authorizing Provider  ACCRUFER 30 MG CAPS TAKE 1 CAPSULE BY MOUTH EVERY DAY IN THE MORNING AND AT BEDTIME Patient taking differently: Take 30 mg by mouth 2 (two) times daily. 07/29/22   [name], MD  albuterol (VENTOLIN HFA) 108 (90 Base) MCG/ACT inhaler Inhale 2 puffs into the lungs every 4 (four) hours as needed for wheezing or shortness of breath. 10/03/22   Phyllis Ginger, MD  ALPRAZolam Duanne Moron) 0.5 MG tablet Take 1  tablet (0.5 mg total) by mouth 3 (three) times daily as needed for anxiety. 09/30/22   Geradine Girt, DO  aspirin EC 325 MG tablet Take 325 mg by mouth in the morning.    [provider]  butalbital-acetaminophen-caffeine (FIORICET) 50-325-40 MG tablet TAKE 1 TABLET BY MOUTH EVERY 4 (FOUR) HOURS AS NEEDED FOR HEADACHE. 10/03/22   [name], MD  Cholecalciferol (VITAMIN D-3) 25 MCG (1000 UT) CAPS Take 1,000 Units by mouth daily.    [provider]  cyanocobalamin (VITAMIN B12) 1000 MCG tablet TAKE 1 TABLET BY MOUTH EVERY DAY 06/27/22   [name], MD  denosumab ([name]) 60 MG/ML SOSY injection Inject 60 mg into the skin every 6 (six) months.    [provider]  diclofenac Sodium (VOLTAREN) 1 % GEL APPLY 2 GRAMS TO AFFECTED AREA 4 TIMES A DAY Patient taking differently: Apply 2 g topically daily as needed (For pain). 09/15/22   [name], MD  docusate sodium (COLACE) 100 MG capsule Take 1 capsule (100 mg total) by mouth 2 (two) times daily as needed for mild constipation. 09/30/22   Geradine Girt, DO  furosemide (LASIX) 40 MG tablet Take 1 tablet (40 mg total) by mouth 2 (two) times daily. 10/03/22   Phyllis Ginger, MD  ipratropium (ATROVENT HFA) 17 MCG/ACT inhaler INHALE 2 PUFFS INTO THE LUNGS EVERY 4 HOURS AS NEEDED FOR WHEEZE Patient taking differently: Inhale 1 puff into the lungs every 6 (six) hours as needed for wheezing. 09/15/22  [name], MD  ipratropium-albuterol (DUONEB) 0.5-2.5 (3) MG/3ML SOLN Inhale 3 mLs into the lungs every 4 (four) hours as needed. Nebulize 3 ml's and inhale into the lungs one to two times a day 10/03/22   Phyllis Ginger, MD  KLOR-CON M20 20 MEQ tablet TAKE 1 TABLET BY MOUTH EVERY DAY Patient taking differently: Take 20 mEq by mouth daily. 05/03/22   [name], MD  lactulose, encephalopathy, (ENULOSE) 10 GM/15ML SOLN Take 45 mLs (30 g total) by mouth 3 (three) times daily as needed (constipation). Patient taking  differently: Take 30 g by mouth at bedtime as needed (for constipation). 01/26/22   [name], MD  levocetirizine (XYZAL) 5 MG tablet TAKE 1 TABLET BY MOUTH EVERY DAY IN THE EVENING Patient taking differently: Take 5 mg by mouth every evening. 07/22/22   [name], MD  levofloxacin (LEVAQUIN) 750 MG tablet Take 1 tablet (750 mg total) by mouth every other day. 10/01/22   Geradine Girt, DO  linaclotide (LINZESS) 72 MCG capsule Take 1 capsule (72 mcg total) by mouth daily before breakfast. 09/08/22   [name], MD  LIVALO 2 MG TABS TAKE 1 TABLET BY MOUTH EVERY DAY Patient taking differently: Take 2 mg by mouth daily at 12 noon. 02/17/22   Hilty, Nadean Corwin, MD  meclizine (ANTIVERT) 25 MG tablet Take 12.5-25 mg by mouth 3 (three) times daily as needed for dizziness.    [provider]  mometasone-formoterol (DULERA) 200-5 MCG/ACT AERO INHALE 2 PUFFS INTO THE LUNGS TWICE A DAY Patient taking differently: Inhale 2 puffs into the lungs 2 (two) times daily. 09/16/22   Hunsucker, Bonna Gains, MD  montelukast (SINGULAIR) 10 MG tablet TAKE 1 TABLET BY MOUTH EVERYDAY AT BEDTIME Patient taking differently: Take 10 mg by mouth at bedtime. 09/15/22   [name], MD  nortriptyline (PAMELOR) 25 MG capsule TAKE 2 CAPSULES BY MOUTH AT BEDTIME Patient taking differently: Take 50 mg by mouth at bedtime. 09/15/22   [name], MD  Omega 3 1200 MG CAPS Take 1,200 mg by mouth daily.    [provider]  OXYGEN Inhale 3 L/min into the lungs See admin instructions. 3 L/min at bedtime and during the day as needed for shortness of breath    [provider]  pantoprazole (PROTONIX) 40 MG tablet TAKE 1 TABLET BY MOUTH EVERY DAY Patient taking differently: Take 40 mg by mouth daily before breakfast. 04/17/22   [name], MD  polyvinyl alcohol (LIQUIFILM TEARS) 1.4 % ophthalmic solution Place 1 drop into both eyes 3 (three) times daily as needed for dry eyes.    [provider]  predniSONE (STERAPRED UNI-PAK 21 TAB) 10 MG (21) TBPK tablet Please use per package instruction 09/30/22   Geradine Girt, DO  vitamin C (ASCORBIC ACID) 500 MG tablet Take 500 mg by mouth daily.    [provider]      Allergies    Ceftriaxone, Hydroxyzine, Other, Doxycycline, and Lexapro [escitalopram]    Review of Systems   Review of Systems  Physical Exam Updated Vital Signs BP 105/68   Pulse 72   Temp 97.7 F (36.5 C) (Oral)   Resp 13   SpO2 92%  Physical Exam Vitals and nursing note reviewed.  Constitutional:      General: She is not in acute distress.    Appearance: She is well-developed. She is not ill-appearing or diaphoretic.  HENT:     Head:  Normocephalic and atraumatic.     Mouth/Throat:     Mouth: Mucous membranes are dry.  Eyes:     Conjunctiva/sclera: Conjunctivae normal.  Cardiovascular:     Rate and Rhythm: Normal rate and regular rhythm.     Heart sounds: No murmur heard. Pulmonary:     Effort: Pulmonary effort is normal. No tachypnea, accessory muscle usage or respiratory distress.     Breath sounds: Examination of the right-lower field reveals decreased breath sounds. Examination of the left-lower field reveals decreased breath sounds. Decreased breath sounds present. No wheezing, rhonchi or rales.  Abdominal:     Palpations: Abdomen is soft.     Tenderness: There is no abdominal tenderness. There is no guarding or rebound.  Musculoskeletal:        General: No swelling.     Cervical back: Neck supple.     Right lower leg: No tenderness. No edema.     Left lower leg: No tenderness. No edema.  Skin:    General: Skin is warm and dry.     Capillary Refill: Capillary refill takes less than 2 seconds.  Neurological:     General: No focal deficit present.     Mental Status: She is alert and oriented to person, place, and time.  Psychiatric:        Mood and Affect: Mood normal.     ED Results / Procedures / Treatments    Labs (all labs ordered are listed, but only abnormal results are displayed) Labs Reviewed  CBC WITH DIFFERENTIAL/PLATELET - Abnormal; Notable for the following components:      Result Value   MCHC 28.4 (*)    RDW 18.9 (*)    All other components within normal limits  COMPREHENSIVE METABOLIC PANEL - Abnormal; Notable for the following components:   Chloride 88 (*)    CO2 38 (*)    Albumin 3.2 (*)    All other components within normal limits  BRAIN NATRIURETIC PEPTIDE - Abnormal; Notable for the following components:   B Natriuretic Peptide 118.3 (*)    All other components within normal limits  I-STAT VENOUS BLOOD GAS, ED - Abnormal; Notable for the following components:   pCO2, Ven 79.6 (*)    pO2, Ven 21 (*)    Bicarbonate 47.0 (*)    TCO2 49 (*)    Acid-Base Excess 17.0 (*)    Calcium, Ion 1.09 (*)    All other components within normal limits  I-STAT CHEM 8, ED - Abnormal; Notable for the following components:   Chloride 88 (*)    Calcium, Ion 1.09 (*)    TCO2 43 (*)    All other components within normal limits  TROPONIN I (HIGH SENSITIVITY) - Abnormal; Notable for the following components:   Troponin I (High Sensitivity) 21 (*)    All other components within normal limits  TROPONIN I (HIGH SENSITIVITY) - Abnormal; Notable for the following components:   Troponin I (High Sensitivity) 18 (*)    All other components within normal limits    EKG EKG Interpretation  Date/Time:  Sunday October 03 2022 18:53:53 EST Ventricular Rate:  72 PR Interval:  173 QRS Duration: 86 QT Interval:  391 QTC Calculation: 428 R Axis:   12 Text Interpretation: Sinus rhythm Probable left atrial enlargement Consider anterior infarct No significant change since last tracing Confirmed by Deno Etienne [phone]) on 10/03/2022 8:18:30 PM  Radiology DG Chest 2 View  Result Date: 10/03/2022 CLINICAL DATA:  SOB EXAM: CHEST -  2 VIEW COMPARISON:  09/27/2022 FINDINGS: Pulmonary vascular congestion noted.  Right upper lobe alveolar consolidation has diminished compared to the prior study. Cardiac silhouette is enlarged. No pneumothorax identified. No pleural effusion. Aorta is calcified. IMPRESSION: 1. Enlarged cardiac silhouette. 2. Pulmonary vascular congestion. 3. Decreasing alveolar consolidation right upper lung consistent with partial resolution of pneumonia. Electronically Signed   By: Sammie Bench M.D.   On: 10/03/2022 20:42    Procedures Procedures    Medications Ordered in ED Medications  albuterol (VENTOLIN HFA) 108 (90 Base) MCG/ACT inhaler 2 puff (has no administration in time range)  aspirin EC tablet 325 mg (has no administration in time range)  furosemide (LASIX) tablet 40 mg (has no administration in time range)  ipratropium (ATROVENT HFA) inhaler 1 puff (has no administration in time range)  ipratropium-albuterol (DUONEB) 0.5-2.5 (3) MG/3ML nebulizer solution 3 mL (has no administration in time range)  potassium chloride SA (KLOR-CON M) CR tablet 20 mEq (has no administration in time range)  levocetirizine (XYZAL) tablet 5 mg (has no administration in time range)  linaclotide (LINZESS) capsule 72 mcg (has no administration in time range)  pravastatin (PRAVACHOL) tablet 40 mg (has no administration in time range)  meclizine (ANTIVERT) tablet 12.5-25 mg (has no administration in time range)  mometasone-formoterol (DULERA) 200-5 MCG/ACT inhaler 2 puff (has no administration in time range)  montelukast (SINGULAIR) tablet 10 mg (has no administration in time range)  nortriptyline (PAMELOR) capsule 50 mg (has no administration in time range)  pantoprazole (PROTONIX) EC tablet 40 mg (has no administration in time range)  polyvinyl alcohol (LIQUIFILM TEARS) 1.4 % ophthalmic solution 1 drop (has no administration in time range)  furosemide (LASIX) 100 mg in dextrose 5 % 50 mL IVPB (0 mg Intravenous Stopped 10/03/22 2251)    ED Course/ Medical Decision Making/ A&P                            Medical Decision Making Problems Addressed: Hypoxia: acute illness or injury that poses a threat to life or bodily functions  Amount and/or Complexity of Data Reviewed Independent Historian: caregiver and EMS    Details: daughter Labs: ordered. Decision-making details documented in ED Course. Radiology: ordered and independent interpretation performed. Decision-making details documented in ED Course. ECG/medicine tests: ordered and independent interpretation performed. Decision-making details documented in ED Course.  Risk Prescription drug management.   [name] is a 77 y.o. female with PMH of breast cancer, emphysema, chronic hypoxic respiratory failure on 4 L nasal cannula, diastolic heart failure, COPD, CAD, hyperlipidemia, aortic stenosis, who presents to the ED with shortness of breath. Exam notable for decreased breath sounds at bilateral bases, but no significant respiratory distress.  Satting well on nebulizer treatment. Afebrile, hemodynamically stable, SaO2 100%, however after removal of nebulizer therapy desatted to 87% and was placed on her home 3 L nasal cannula with adequate oxygen saturation.  She was just discharged in the hospital after treatment for pneumonia.  Feels generally weak and short of breath, although not compliant with her home oxygen administration.  This is the most likely etiology as to her hypoxia.  Additionally, she has COPD history, concern for hypercarbia as to the weakness and slight altered mental status.  Differential diagnosis includes: pneumonia, COPD exacerbation, asthma exacerbation, CHF exacerbation, ACS, pulmonary embolism, pneumothorax. Unlikely PNA as CXR shows prior resolving pneumonia which has been adequately treated for, no leukocytosis, minimal cough, no fever.  Possibly COPD/Asthma  exacerbation as she has history of such, however no wheezing on exam.  Her VBG shows a pH of 7.38 and pCO2 of 79.  Likely chronic elevation in the  setting of bad COPD. Unlikely CHF exacerbation as BNP is at baseline. Unlikely ACS as troponin initially elevated at 21 but downtrending to 18, EKG normal sinus rhythm without any signs of acute ischemia or ST segment changes. Unlikely PE as atypical presentation, low risk per [facility].  Doubt Aortic Dissection, Pancreatitis, Pneumothorax, Arrhythmia, Endo/Myo/Pericarditis, Esophageal pathology, or other Emergent pathology.  Given 100 mg IV Lasix for some pulmonary congestion on x-ray.   Most likely shortness of breath due to noncompliance  With home oxygen.  Overall, labs, EKG, chest x-ray all reviewed.  Patient is stable for discharge home.  On her home oxygen requirement, respiratory rate between 12 and 18 with adequate oxygen saturation.  Discussed on the phone with daughter, [name], who is uncomfortable taking her back home, as she has had multiple hospital visits and discharges home.  Does well for couple days at home, but then comes right back to the hospital because in the morning she wakes up and is hypercarbic and encephalopathic and falls.  They do not have CPAP set up at home.  Daughter notes that she needs CPAP, patient in the past has been intolerant of CPAP, refusing CPAP.  Daughter notes that if she is able to get CPAP set up at home, she will make her mother take it.  Daughter comfortable with patient coming home.  Discussed with her that we can talk with social work and have them see about resources and getting a CPAP at home, and if that is unable to be obtained and daughter is still uncomfortable with patient going back home, as the daughter states that if she comes back home she will just call EMS again tomorrow when this happens again, then the patient may need a higher level of care than living at home such as an assisted living facility.  Home meds were ordered, patient will be a border in the emergency department while awaiting further disposition.  The plan for this patient was  discussed with Dr. Tyrone Nine, who voiced agreement and who oversaw evaluation and treatment of this patient.          Final Clinical Impression(s) / ED Diagnoses Final diagnoses:  Hypoxia    Rx / DC Orders ED Discharge Orders          Ordered    albuterol (VENTOLIN HFA) 108 (90 Base) MCG/ACT inhaler  Every 4 hours PRN        10/03/22 2317    furosemide (LASIX) 40 MG tablet  2 times daily        10/03/22 2317    ipratropium-albuterol (DUONEB) 0.5-2.5 (3) MG/3ML SOLN  Every 4 hours PRN        10/03/22 2317              Phyllis Ginger, MD 10/03/22 [place], [place], DO 10/05/22 2327

## 2022-10-04 ENCOUNTER — Telehealth: Payer: Self-pay | Admitting: Internal Medicine

## 2022-10-04 ENCOUNTER — Emergency Department (HOSPITAL_COMMUNITY): Payer: Medicare Other

## 2022-10-04 ENCOUNTER — Emergency Department (HOSPITAL_COMMUNITY)
Admission: EM | Admit: 2022-10-04 | Discharge: 2022-10-06 | Disposition: A | Discharge status: Home / Self Care | Payer: Medicare Other | Attending: Emergency Medicine | Admitting: Emergency Medicine

## 2022-10-04 ENCOUNTER — Encounter (HOSPITAL_COMMUNITY): Payer: Self-pay

## 2022-10-04 ENCOUNTER — Other Ambulatory Visit: Payer: Self-pay

## 2022-10-04 DIAGNOSIS — T7691XA Unspecified adult maltreatment, suspected, initial encounter: Secondary | ICD-10-CM | POA: Diagnosis not present

## 2022-10-04 DIAGNOSIS — J9612 Chronic respiratory failure with hypercapnia: Secondary | ICD-10-CM | POA: Diagnosis not present

## 2022-10-04 DIAGNOSIS — J440 Chronic obstructive pulmonary disease with acute lower respiratory infection: Secondary | ICD-10-CM | POA: Diagnosis not present

## 2022-10-04 DIAGNOSIS — F29 Unspecified psychosis not due to a substance or known physiological condition: Secondary | ICD-10-CM | POA: Insufficient documentation

## 2022-10-04 DIAGNOSIS — I5032 Chronic diastolic (congestive) heart failure: Secondary | ICD-10-CM | POA: Diagnosis present

## 2022-10-04 DIAGNOSIS — Z9981 Dependence on supplemental oxygen: Secondary | ICD-10-CM | POA: Diagnosis not present

## 2022-10-04 DIAGNOSIS — Z1152 Encounter for screening for COVID-19: Secondary | ICD-10-CM | POA: Diagnosis not present

## 2022-10-04 DIAGNOSIS — J9611 Chronic respiratory failure with hypoxia: Secondary | ICD-10-CM

## 2022-10-04 DIAGNOSIS — R2689 Other abnormalities of gait and mobility: Secondary | ICD-10-CM | POA: Diagnosis not present

## 2022-10-04 DIAGNOSIS — J441 Chronic obstructive pulmonary disease with (acute) exacerbation: Secondary | ICD-10-CM

## 2022-10-04 LAB — BLOOD GAS, VENOUS
Acid-Base Excess: 28.1 mmol/L — ABNORMAL HIGH (ref 0.0–2.0)
Bicarbonate: 58.4 mmol/L — ABNORMAL HIGH (ref 20.0–28.0)
O2 Saturation: 37.2 %
Patient temperature: 37
pCO2, Ven: 86 mmHg (ref 44–60)
pH, Ven: 7.44 — ABNORMAL HIGH (ref 7.25–7.43)
pO2, Ven: <31 mmHg — CL (ref 32–45)

## 2022-10-04 LAB — CBC WITH DIFFERENTIAL/PLATELET
Abs Immature Granulocytes: 0.04 10*3/uL (ref 0.00–0.07)
Basophils Absolute: 0 10*3/uL (ref 0.0–0.1)
Basophils Relative: 0 %
Eosinophils Absolute: 0 10*3/uL (ref 0.0–0.5)
Eosinophils Relative: 0 %
HCT: 38.5 % (ref 36.0–46.0)
Hemoglobin: 11 g/dL — ABNORMAL LOW (ref 12.0–15.0)
Immature Granulocytes: 0 %
Lymphocytes Relative: 3 %
Lymphs Abs: 0.3 10*3/uL — ABNORMAL LOW (ref 0.7–4.0)
MCH: 27.4 pg (ref 26.0–34.0)
MCHC: 28.6 g/dL — ABNORMAL LOW (ref 30.0–36.0)
MCV: 96 fL (ref 80.0–100.0)
Monocytes Absolute: 0.1 10*3/uL (ref 0.1–1.0)
Monocytes Relative: 1 %
Neutro Abs: 8.5 10*3/uL — ABNORMAL HIGH (ref 1.7–7.7)
Neutrophils Relative %: 96 %
Platelets: 295 10*3/uL (ref 150–400)
RBC: 4.01 MIL/uL (ref 3.87–5.11)
RDW: 18.7 % — ABNORMAL HIGH (ref 11.5–15.5)
WBC: 9 10*3/uL (ref 4.0–10.5)
nRBC: 0 % (ref 0.0–0.2)

## 2022-10-04 LAB — BLOOD GAS, ARTERIAL
Acid-Base Excess: 24.5 mmol/L — ABNORMAL HIGH (ref 0.0–2.0)
Bicarbonate: 53.1 mmol/L — ABNORMAL HIGH (ref 20.0–28.0)
O2 Saturation: 92.6 %
Patient temperature: 37
pCO2 arterial: 73 mmHg (ref 32–48)
pH, Arterial: 7.47 — ABNORMAL HIGH (ref 7.35–7.45)
pO2, Arterial: 59 mmHg — ABNORMAL LOW (ref 83–108)

## 2022-10-04 LAB — TROPONIN I (HIGH SENSITIVITY)
Troponin I (High Sensitivity): 18 ng/L — ABNORMAL HIGH (ref <18)
Troponin I (High Sensitivity): 15 ng/L (ref <18)

## 2022-10-04 LAB — COMPREHENSIVE METABOLIC PANEL
ALT: 19 U/L (ref 0–44)
AST: 20 U/L (ref 15–41)
Albumin: 3 g/dL — ABNORMAL LOW (ref 3.5–5.0)
Alkaline Phosphatase: 56 U/L (ref 38–126)
Anion gap: 9 (ref 5–15)
BUN: 20 mg/dL (ref 8–23)
CO2: 44 mmol/L — ABNORMAL HIGH (ref 22–32)
Calcium: 9.1 mg/dL (ref 8.9–10.3)
Chloride: 81 mmol/L — ABNORMAL LOW (ref 98–111)
Creatinine, Ser: 1 mg/dL (ref 0.44–1.00)
GFR, Estimated: 58 mL/min — ABNORMAL LOW (ref >60)
Glucose, Bld: 103 mg/dL — ABNORMAL HIGH (ref 70–99)
Potassium: 4.4 mmol/L (ref 3.5–5.1)
Sodium: 134 mmol/L — ABNORMAL LOW (ref 135–145)
Total Bilirubin: 0.3 mg/dL (ref 0.3–1.2)
Total Protein: 6.6 g/dL (ref 6.5–8.1)

## 2022-10-04 LAB — CBG MONITORING, ED: Glucose-Capillary: 107 mg/dL — ABNORMAL HIGH (ref 70–99)

## 2022-10-04 LAB — BRAIN NATRIURETIC PEPTIDE: B Natriuretic Peptide: 93.5 pg/mL (ref 0.0–100.0)

## 2022-10-04 MED ORDER — MONTELUKAST SODIUM 10 MG PO TABS
10.0000 mg | ORAL_TABLET | Freq: Every day | ORAL | Status: DC
  Administered 2022-10-04 – 2022-10-05 (×2): 10 mg via ORAL
  Filled 2022-10-04 (×2): qty 1

## 2022-10-04 MED ORDER — PREDNISONE 5 MG PO TABS: 10.0000 mg | ORAL_TABLET | Freq: Every day | ORAL | Status: DC

## 2022-10-04 MED ORDER — LACTULOSE 10 GM/15ML PO SOLN: 30.0000 g | Freq: Every evening | ORAL | Status: DC | PRN

## 2022-10-04 MED ORDER — DOCUSATE SODIUM 100 MG PO CAPS: 100.0000 mg | ORAL_CAPSULE | Freq: Two times a day (BID) | ORAL | Status: DC | PRN

## 2022-10-04 MED ORDER — LEVOCETIRIZINE DIHYDROCHLORIDE 5 MG PO TABS: 5.0000 mg | ORAL_TABLET | Freq: Every evening | ORAL | Status: DC

## 2022-10-04 MED ORDER — POLYVINYL ALCOHOL 1.4 % OP SOLN: 1.0000 [drp] | Freq: Three times a day (TID) | OPHTHALMIC | Status: DC | PRN

## 2022-10-04 MED ORDER — LINACLOTIDE 72 MCG PO CAPS
72.0000 ug | ORAL_CAPSULE | Freq: Every day | ORAL | Status: DC
  Administered 2022-10-06: 72 ug via ORAL
  Filled 2022-10-04 (×3): qty 1

## 2022-10-04 MED ORDER — OMEGA 3 1200 MG PO CAPS: 1200.0000 mg | ORAL_CAPSULE | Freq: Every day | ORAL | Status: DC

## 2022-10-04 MED ORDER — ASPIRIN 325 MG PO TBEC
325.0000 mg | DELAYED_RELEASE_TABLET | Freq: Every day | ORAL | Status: DC
  Administered 2022-10-06: 325 mg via ORAL
  Filled 2022-10-04 (×2): qty 1

## 2022-10-04 MED ORDER — PRAVASTATIN SODIUM 20 MG PO TABS
40.0000 mg | ORAL_TABLET | Freq: Every day | ORAL | Status: DC
  Administered 2022-10-05: 40 mg via ORAL
  Filled 2022-10-04: qty 2

## 2022-10-04 MED ORDER — FUROSEMIDE 40 MG PO TABS
40.0000 mg | ORAL_TABLET | Freq: Two times a day (BID) | ORAL | Status: DC
  Administered 2022-10-04 – 2022-10-06 (×4): 40 mg via ORAL
  Filled 2022-10-04 (×4): qty 1

## 2022-10-04 MED ORDER — NORTRIPTYLINE HCL 25 MG PO CAPS
50.0000 mg | ORAL_CAPSULE | Freq: Every day | ORAL | Status: DC
  Administered 2022-10-04 – 2022-10-05 (×2): 50 mg via ORAL
  Filled 2022-10-04 (×2): qty 2

## 2022-10-04 MED ORDER — ALPRAZOLAM 0.5 MG PO TABS
0.5000 mg | ORAL_TABLET | Freq: Three times a day (TID) | ORAL | Status: DC | PRN
  Administered 2022-10-04 – 2022-10-05 (×3): 0.5 mg via ORAL
  Filled 2022-10-04 (×3): qty 1

## 2022-10-04 MED ORDER — PREDNISONE 20 MG PO TABS: 20.0000 mg | ORAL_TABLET | Freq: Every day | ORAL | Status: DC

## 2022-10-04 MED ORDER — PREDNISONE 5 MG PO TABS: 30.0000 mg | ORAL_TABLET | Freq: Every day | ORAL | Status: DC

## 2022-10-04 MED ORDER — LORATADINE 10 MG PO TABS
10.0000 mg | ORAL_TABLET | Freq: Every evening | ORAL | Status: DC
  Administered 2022-10-04 – 2022-10-05 (×2): 10 mg via ORAL
  Filled 2022-10-04 (×2): qty 1

## 2022-10-04 MED ORDER — IPRATROPIUM-ALBUTEROL 0.5-2.5 (3) MG/3ML IN SOLN: 3.0000 mL | RESPIRATORY_TRACT | Status: DC | PRN

## 2022-10-04 MED ORDER — PREDNISONE 20 MG PO TABS: 40.0000 mg | ORAL_TABLET | Freq: Every day | ORAL | Status: DC

## 2022-10-04 MED ORDER — DICLOFENAC SODIUM 1 % EX GEL: 2.0000 g | Freq: Every day | CUTANEOUS | Status: DC | PRN

## 2022-10-04 MED ORDER — ALBUTEROL SULFATE HFA 108 (90 BASE) MCG/ACT IN AERS: 2.0000 | INHALATION_SPRAY | RESPIRATORY_TRACT | Status: DC | PRN

## 2022-10-04 MED ORDER — OMEGA-3-ACID ETHYL ESTERS 1 G PO CAPS
1.0000 g | ORAL_CAPSULE | Freq: Every day | ORAL | Status: DC
  Administered 2022-10-06: 1 g via ORAL
  Filled 2022-10-04 (×2): qty 1

## 2022-10-04 MED ORDER — METHYLPREDNISOLONE SODIUM SUCC 125 MG IJ SOLR
125.0000 mg | Freq: Once | INTRAMUSCULAR | Status: AC
  Administered 2022-10-04: 125 mg via INTRAVENOUS
  Filled 2022-10-04: qty 2

## 2022-10-04 MED ORDER — FERRIC MALTOL 30 MG PO CAPS: 30.0000 mg | ORAL_CAPSULE | Freq: Two times a day (BID) | ORAL | Status: DC

## 2022-10-04 MED ORDER — ALBUTEROL SULFATE (2.5 MG/3ML) 0.083% IN NEBU: 2.5000 mg | INHALATION_SOLUTION | RESPIRATORY_TRACT | Status: DC | PRN

## 2022-10-04 MED ORDER — POTASSIUM CHLORIDE CRYS ER 20 MEQ PO TBCR
20.0000 meq | EXTENDED_RELEASE_TABLET | Freq: Every day | ORAL | Status: DC
  Administered 2022-10-05 – 2022-10-06 (×2): 20 meq via ORAL
  Filled 2022-10-04 (×2): qty 1

## 2022-10-04 MED ORDER — MOMETASONE FURO-FORMOTEROL FUM 200-5 MCG/ACT IN AERO
2.0000 | INHALATION_SPRAY | Freq: Two times a day (BID) | RESPIRATORY_TRACT | Status: DC
  Administered 2022-10-04 – 2022-10-06 (×3): 2 via RESPIRATORY_TRACT
  Filled 2022-10-04: qty 8.8

## 2022-10-04 MED ORDER — PANTOPRAZOLE SODIUM 40 MG PO TBEC
40.0000 mg | DELAYED_RELEASE_TABLET | Freq: Every day | ORAL | Status: DC
  Administered 2022-10-05 – 2022-10-06 (×2): 40 mg via ORAL
  Filled 2022-10-04 (×2): qty 1

## 2022-10-04 MED ORDER — PREDNISONE 20 MG PO TABS
40.0000 mg | ORAL_TABLET | Freq: Every day | ORAL | Status: AC
  Administered 2022-10-05: 40 mg via ORAL
  Filled 2022-10-04: qty 2

## 2022-10-04 MED ORDER — IPRATROPIUM BROMIDE 0.02 % IN SOLN
0.5000 mg | Freq: Once | RESPIRATORY_TRACT | Status: AC
  Administered 2022-10-04: 0.5 mg via RESPIRATORY_TRACT
  Filled 2022-10-04: qty 2.5

## 2022-10-04 MED ORDER — MECLIZINE HCL 25 MG PO TABS: 12.5000 mg | ORAL_TABLET | Freq: Three times a day (TID) | ORAL | Status: DC | PRN

## 2022-10-04 MED ORDER — PREDNISONE 5 MG PO TABS
30.0000 mg | ORAL_TABLET | Freq: Every day | ORAL | Status: AC
  Administered 2022-10-06: 30 mg via ORAL
  Filled 2022-10-04: qty 2

## 2022-10-04 MED ORDER — ALBUTEROL SULFATE (2.5 MG/3ML) 0.083% IN NEBU
5.0000 mg | INHALATION_SOLUTION | Freq: Once | RESPIRATORY_TRACT | Status: AC
  Administered 2022-10-04: 5 mg via RESPIRATORY_TRACT
  Filled 2022-10-04: qty 6

## 2022-10-04 NOTE — Progress Notes (Signed)
APS report made to Creig Hines, CSW has requested a call back.

## 2022-10-04 NOTE — ED Notes (Signed)
Daughter of pt called again and asked for update, this nurse explained to daughter that because pt is A&Ox4, the pt has the right to refuse information be given out to anyone/ this nurse requested that the pt call her daughter to given an update. [name] (daughter) agreed to speak with the pt.
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
## 2022-10-04 NOTE — Telephone Encounter (Signed)
[name] called stated that the patient is currently in the hospital and has been diagnosed with Hypocapnia. The daughter states that her mother is having memory loss and is falling occasionally. [name] is requesting her mother get 24 hour care and she states that a FL2 needs to be filled out for her mother so you can be provided 24 hr monitoring care. Best callback number for patient's daughter is [phone].

## 2022-10-04 NOTE — ED Notes (Signed)
Pt refuses to wear CPAP.  °

## 2022-10-04 NOTE — Assessment & Plan Note (Signed)
After pt back on O2, respiratory status at baseline.  Pt denies any symptoms currently.  Satting 98-100% on 4L via [place]. ABG demonstrates chronic (compensated) hypercapnea with normal pH. CXR demonstrates near resolution of prior PNA. Cont home nebs Cont home QHS CPAP Cont home steroid taper for recent admit for COPD exacerbation Has completed ABx for PNA Not really any indication for admission to IP service at this time.
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
## 2022-10-04 NOTE — Progress Notes (Addendum)
Patient does not want her medical information giving  to her daughter, please confront patient first before calling the daughter.
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
## 2022-10-04 NOTE — ED Notes (Signed)
Pt told this nurse that all of her information should NOT be shared with [name] (daughter). Pt is A&O x4.
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
## 2022-10-04 NOTE — Telephone Encounter (Signed)
Daughter stated patient was in the ED and was diagnosed with hypocapnia however she refused to put on the CPAP and discharged herself.  Daughter stated she would like assistance with her mother and advice on getting the patient placed.

## 2022-10-04 NOTE — ED Notes (Signed)
CRITICAL RESULT- PCO2- 73  Dr. Darl Householder made aware at [facility]

## 2022-10-04 NOTE — Progress Notes (Signed)
CSW has obtained PASRR for AM CSW   [id number]

## 2022-10-04 NOTE — Telephone Encounter (Signed)
Pt has been scheduled for ED follow up for 1/11 @ 11.20am.
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
## 2022-10-04 NOTE — Assessment & Plan Note (Signed)
Cont home lasix, ASA, Statin

## 2022-10-04 NOTE — ED Notes (Signed)
I just called [name]'s phone again and she sent me to voicemail. I left a message that her mom will arrive very SOB since she does not have an oxygen tank to go home with.

## 2022-10-05 ENCOUNTER — Telehealth: Payer: Self-pay | Admitting: *Deleted

## 2022-10-05 ENCOUNTER — Encounter: Payer: Self-pay | Admitting: *Deleted

## 2022-10-05 DIAGNOSIS — F29 Unspecified psychosis not due to a substance or known physiological condition: Secondary | ICD-10-CM | POA: Diagnosis not present
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
## 2022-10-05 NOTE — Patient Outreach (Signed)
  Care Coordination [facility] Note Transition Care Management Unsuccessful Follow-up Telephone Call  Date of discharge and from where:  Thursday, 09/30/22, [name]; pneumonia; acute on chronic respiratory failure  Attempts:  2nd Attempt- upon review on 10/04/22 for second attempt on 10/04/22, noted patient was at ED- second attempt call deferred until today accordingly  Reason for unsuccessful TCM follow-up call:  Left voice message  Oneta Rack, RN, BSN, CCRN Alumnus RN CM Care Coordination/ Transition of [place] Management [phone]: direct office
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
## 2022-10-06 ENCOUNTER — Telehealth: Payer: Self-pay | Admitting: *Deleted

## 2022-10-06 ENCOUNTER — Telehealth: Payer: Self-pay | Admitting: Internal Medicine

## 2022-10-06 ENCOUNTER — Encounter: Payer: Self-pay | Admitting: *Deleted

## 2022-10-06 DIAGNOSIS — F29 Unspecified psychosis not due to a substance or known physiological condition: Secondary | ICD-10-CM | POA: Diagnosis not present

## 2022-10-06 MED ORDER — ALPRAZOLAM 0.5 MG PO TABS: 0.5000 mg | ORAL_TABLET | Freq: Three times a day (TID) | ORAL | 0 refills | Status: DC | PRN

## 2022-10-06 NOTE — Progress Notes (Addendum)
This CSW attempted to contact the pt's daughter without success. This CSW spoke with the pt and informed that there have been several attempts to reacher her daughter, the pt states "she's not going to answer the phone because she doesn't want me to go home. I'm going to rehab." This CSW verified that the pt is going to rehab and she states "Yes, I'm going to rehab. I'm sorry for the misunderstanding." This CSW has informed Altha Harm, at [facility] PTAR to transport.   [place], Central Intake states the SNF's social worker will work with the pt to transition to LTC if that is what the pt wishes to do.

## 2022-10-06 NOTE — Patient Outreach (Signed)
  Care Coordination [facility] Note Transition Care Management Unsuccessful Follow-up Telephone Call  Date of discharge and from where:  Thursday, 09/30/22, [facility]; pneumonia, acute on chronic respiratory failure  Attempts:  3rd Attempt  Reason for unsuccessful TCM follow-up call:  Left voice message  Oneta Rack, RN, BSN, CCRN Alumnus RN CM Care Coordination/ Transition of [place] Management [phone]: direct office

## 2022-10-06 NOTE — Progress Notes (Addendum)
Pt will be d/c to SNF at [facility]. EDP and RN notified. PTAR to transport.   Addend @ 9:51 AM Pt is now saying she is going home instead of going to rehab. This CSW asked if she changed her mind because yesterday she stated "I'm going to a nursing home" and proceeded to select which one she wants to go to. The pt is now saying "I don't know.. call my daughter and see if she's coming to get me." This CSW has attempted to contact the daughter twice and left a HIPAA Compliant voicemail. EDP and RN notified.

## 2022-10-06 NOTE — Discharge Instructions (Addendum)
This patient likely would benefit from CPAP at night but currently is not on CPAP by patient choice.  She does not warrant CPAP here in the hospital and has not had significant hypoxic events while monitored.  She is safe for discharge to SNF with no forced requirement of nighttime CPAP but if she were to change her mind this may be helpful.

## 2022-10-06 NOTE — Progress Notes (Signed)
Pt refused cpap

## 2022-10-06 NOTE — ED Notes (Signed)
Report called to SNF to [facility] @ [phone]

## 2022-10-06 NOTE — Telephone Encounter (Signed)
Patients daughter called to cancel because patient was admitted to [facility] rehab because she has been falling for about 2 weeks. She was diagnosed with Hypocapnia by ER at [facility] when she went 10/04/22. Call back if needed is [phone]

## 2022-10-06 NOTE — ED Notes (Signed)
Went in room to explain that PTAR has been called for transport to [facility] and patient states "I'm going home call my daughter"  Case manager jada notified. PTAR canceled for now

## 2022-10-06 NOTE — ED Provider Notes (Signed)
Emergency Medicine Observation Re-evaluation Note  [name] is a 77 y.o. female, seen on rounds today.  Pt initially presented to the ED for complaints of Shortness of Breath and Altered Mental Status Currently, the patient is resting comfortably.  Physical Exam  BP 129/60   Pulse 67   Temp 98.4 F (36.9 C) (Oral)   Resp 18   SpO2 100%  Physical Exam Vitals and nursing note reviewed.  Constitutional:      General: She is not in acute distress.    Appearance: She is well-developed.  HENT:     Head: Normocephalic and atraumatic.  Eyes:     Conjunctiva/sclera: Conjunctivae normal.  Cardiovascular:     Rate and Rhythm: Normal rate and regular rhythm.     Heart sounds: No murmur heard. Pulmonary:     Effort: Pulmonary effort is normal. No respiratory distress.  Musculoskeletal:        General: No swelling.     Cervical back: Neck supple.  Skin:    General: Skin is warm and dry.     Capillary Refill: Capillary refill takes less than 2 seconds.  Neurological:     Mental Status: She is alert.  Psychiatric:        Mood and Affect: Mood normal.      ED Course / MDM  EKG:EKG Interpretation  Date/Time:  Monday October 04 2022 17:18:04 EST Ventricular Rate:  79 PR Interval:  170 QRS Duration: 81 QT Interval:  386 QTC Calculation: 443 R Axis:   43 Text Interpretation: Sinus rhythm Probable left atrial enlargement Anterior infarct, old No significant change since last tracing Confirmed by Wandra Arthurs [phone]) on 10/04/2022 7:28:54 PM  I have reviewed the labs performed to date as well as medications administered while in observation.  Recent changes in the last 24 hours include social work evaluation and plan for discharge to [facility] today.  Plan  Current plan is for discharge to [facility] today.    Teressa Lower, MD 10/06/22 [phone]

## 2022-10-07 ENCOUNTER — Ambulatory Visit: Payer: Medicare Other | Admitting: Internal Medicine

## 2022-10-08 ENCOUNTER — Ambulatory Visit: Payer: Medicare Other

## 2022-10-09 ENCOUNTER — Other Ambulatory Visit: Payer: Self-pay | Admitting: Pulmonary Disease

## 2022-10-09 DIAGNOSIS — J438 Other emphysema: Secondary | ICD-10-CM

## 2022-10-09 DIAGNOSIS — J9611 Chronic respiratory failure with hypoxia: Secondary | ICD-10-CM

## 2022-10-18 ENCOUNTER — Telehealth: Payer: Self-pay

## 2022-10-18 NOTE — Telephone Encounter (Signed)
        Patient  visited [facility] on 1/10     Telephone encounter attempt : 1st   A HIPAA compliant voice message was left requesting a return call.  Instructed patient to call back .    [place], Care Management  [phone] [address], [place], [place] [address] Phone: [phone] Email: [email or web address]

## 2022-10-19 ENCOUNTER — Telehealth: Payer: Self-pay

## 2022-10-19 NOTE — Telephone Encounter (Signed)
        Patient  visited [facility] on 1/10   Telephone encounter attempt :  2nd  A HIPAA compliant voice message was left requesting a return call.  Instructed patient to call back    [place], [place] Management  [phone] [address], [place], [place] [address] Phone: [phone] Email: [email or web address]

## 2022-10-28 ENCOUNTER — Telehealth: Payer: Self-pay | Admitting: Internal Medicine

## 2022-10-28 NOTE — Telephone Encounter (Addendum)
Refill is not needed at this time as [name] is only given every 6 mo.

## 2022-10-28 NOTE — Telephone Encounter (Signed)
Caller & Relationship to patient: pharmacy  Call back number: [phone]  Date of last office visit: 10.24.23  Date of next office visit: N/A  Medication(s) to be refilled:  denosumab ([name]) 60 MG/ML SOSY injection   Preferred Pharmacy:  [place], [place] - [address]   [phone]

## 2022-11-01 NOTE — Telephone Encounter (Signed)
[name] VOB initiated via [email or web address]  Last [name] inj 06/01/22 Next [name] inj due 12/01/22

## 2022-11-09 ENCOUNTER — Telehealth: Payer: Self-pay | Admitting: Internal Medicine

## 2022-11-09 DIAGNOSIS — K5904 Chronic idiopathic constipation: Secondary | ICD-10-CM

## 2022-11-09 NOTE — Telephone Encounter (Signed)
[name], [name], called with a couple of things she wanted to talk to a nurse about. She stated the patient needs to get medications refilled because rehab won't give them back and were advised to call in new prescriptions. She also wants to know when the patient can get prolia shot. Best callback number for [name] is [phone].

## 2022-11-09 NOTE — Telephone Encounter (Signed)
Pt's daughter stated that [facility] will not release medications back to the pt after discharge. Their policy stated that all medications in rehab possession must be discarded when the patient is discharged.   Pt is needing a refill on all Rx's from PCP as she is being discharged to go [facility]. Please advise.

## 2022-11-12 ENCOUNTER — Other Ambulatory Visit: Payer: Self-pay | Admitting: Internal Medicine

## 2022-11-12 DIAGNOSIS — J449 Chronic obstructive pulmonary disease, unspecified: Secondary | ICD-10-CM

## 2022-11-12 DIAGNOSIS — J438 Other emphysema: Secondary | ICD-10-CM

## 2022-11-12 DIAGNOSIS — K5904 Chronic idiopathic constipation: Secondary | ICD-10-CM

## 2022-11-12 MED ORDER — ALBUTEROL SULFATE HFA 108 (90 BASE) MCG/ACT IN AERS: 2.0000 | INHALATION_SPRAY | RESPIRATORY_TRACT | 5 refills | Status: DC | PRN

## 2022-11-12 MED ORDER — IPRATROPIUM-ALBUTEROL 0.5-2.5 (3) MG/3ML IN SOLN: 3.0000 mL | RESPIRATORY_TRACT | 1 refills | Status: DC | PRN

## 2022-11-12 MED ORDER — LINACLOTIDE 72 MCG PO CAPS: 72.0000 ug | ORAL_CAPSULE | Freq: Every day | ORAL | 1 refills | Status: DC

## 2022-11-18 ENCOUNTER — Other Ambulatory Visit: Payer: Self-pay | Admitting: Internal Medicine

## 2022-11-18 DIAGNOSIS — K2100 Gastro-esophageal reflux disease with esophagitis, without bleeding: Secondary | ICD-10-CM

## 2022-11-23 NOTE — Telephone Encounter (Signed)
Forwarding to Rx Prior Auth Team 

## 2022-11-25 ENCOUNTER — Telehealth: Payer: Self-pay | Admitting: Internal Medicine

## 2022-11-25 NOTE — Telephone Encounter (Signed)
I have called pt's daughter, [name], she stated that the pt passed peacefully in her sleep. She wanted to offer her sincere thanks to PCP and CMA stating that PCP took very good care of her while she was here. I gave my condolences and informed her if she needed anything from [place] to let us know.

## 2022-11-25 NOTE — Telephone Encounter (Deleted)
Kim's phone number is [phone].

## 2022-11-25 NOTE — Telephone Encounter (Addendum)
[name], [name]. Called to let us know her mother passed last night . She would also like a call back from [place] to thank her for her kindness. Her phone number is [phone].

## 2022-11-25 NOTE — Telephone Encounter (Signed)
[place], Ozan "[name]" - 11/25/2022  8:10 AM Debara Pickett Nadean Corwin, MD  Sent: Thu November 25, 2022  8:38 AM  To: Nuala Alpha, LPN         Message  Thanks . Sad to hear that .Marland Kitchen She was sweet.    Dr. Debara Pickett

## 2022-11-25 NOTE — Telephone Encounter (Signed)
[name] called to let Dr. Debara Pickett know that her mother passed away last night.

## 2022-11-25 NOTE — Telephone Encounter (Signed)
Will route this message to Dr. Debara Pickett and covering RN, as a general FYI.

## 2022-11-26 NOTE — Telephone Encounter (Signed)
Pt ready for scheduling on or after 12/01/22   Out-of-pocket cost due at time of visit: $0   Primary: Medicare [name] co-insurance: 0% Admin fee co-insurance: 0%    Secondary: Medicaid [name] co-insurance: 0% Admin fee co-insurance: 0%   Deductible: does not apply   Prior Auth: not required PA# Valid:      ** This summary of benefits is an estimation of the patient's out-of-pocket cost. Exact cost may very based on individual plan coverage.

## 2022-11-26 DEATH — deceased

## 2022-11-27 ENCOUNTER — Telehealth: Payer: Self-pay | Admitting: Internal Medicine

## 2022-11-27 NOTE — Telephone Encounter (Signed)
Death certificate was certified by myself in the [place] system.  Pixie Casino, MD, [facility], [place] Director of the [facility] Lipid Disorders &  Cardiovascular Risk Reduction Clinic Diplomate of the American Board of Clinical Lipidology Attending Cardiologist  Direct Dial: [phone]  Fax: [phone]  Website:  [email or web address]

## 2022-12-02 ENCOUNTER — Ambulatory Visit: Payer: Medicare Other | Admitting: Internal Medicine

## 2023-02-28 ENCOUNTER — Encounter (HOSPITAL_COMMUNITY): Payer: Self-pay | Admitting: Internal Medicine

## 2023-08-04 IMAGING — DX DG CHEST 1V PORT
1 series · 1 of 1 positions shown · non-contrast
Comparison: December 2020

CLINICAL DATA: dyspnea

EXAM:
PORTABLE CHEST 1 VIEW

[chest]
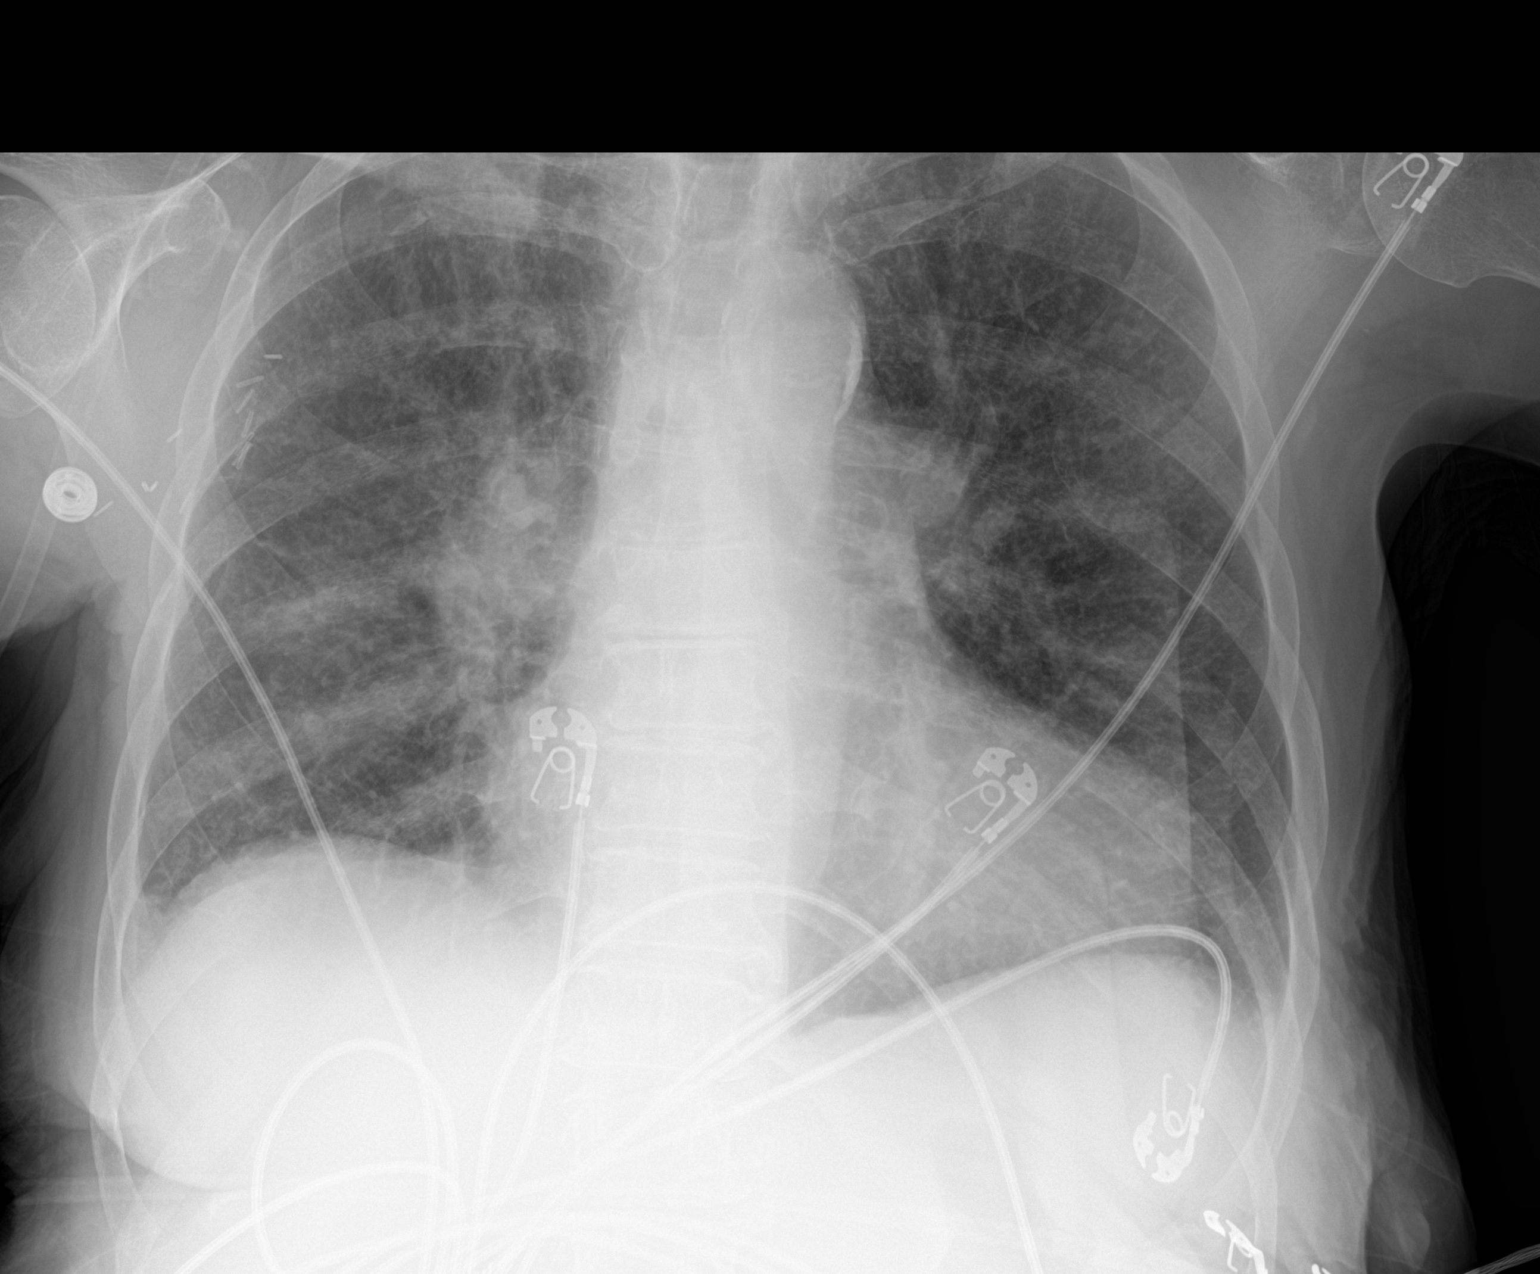

[1 of 1 positions shown; findings below may reference images not displayed]

FINDINGS: The heart appears enlarged. No pleural effusion. No pneumothorax.
Slightly increased interstitial markings without lobar
consolidation. Aortic calcifications. Surgical clips overlying the
right axilla.
IMPRESSION: Cardiomegaly with slightly increased interstitial markings
suggestive of mild pulmonary edema. No large effusion or lobar
consolidation.

## 2023-08-07 IMAGING — DX DG CHEST 2V
2 series · 2 of 2 positions shown · non-contrast
Comparison: [DATE] [DATE], [DATE], [DATE] [DATE], [DATE]

CLINICAL DATA: COPD

EXAM:
CHEST - 2 VIEW

[chest lat]
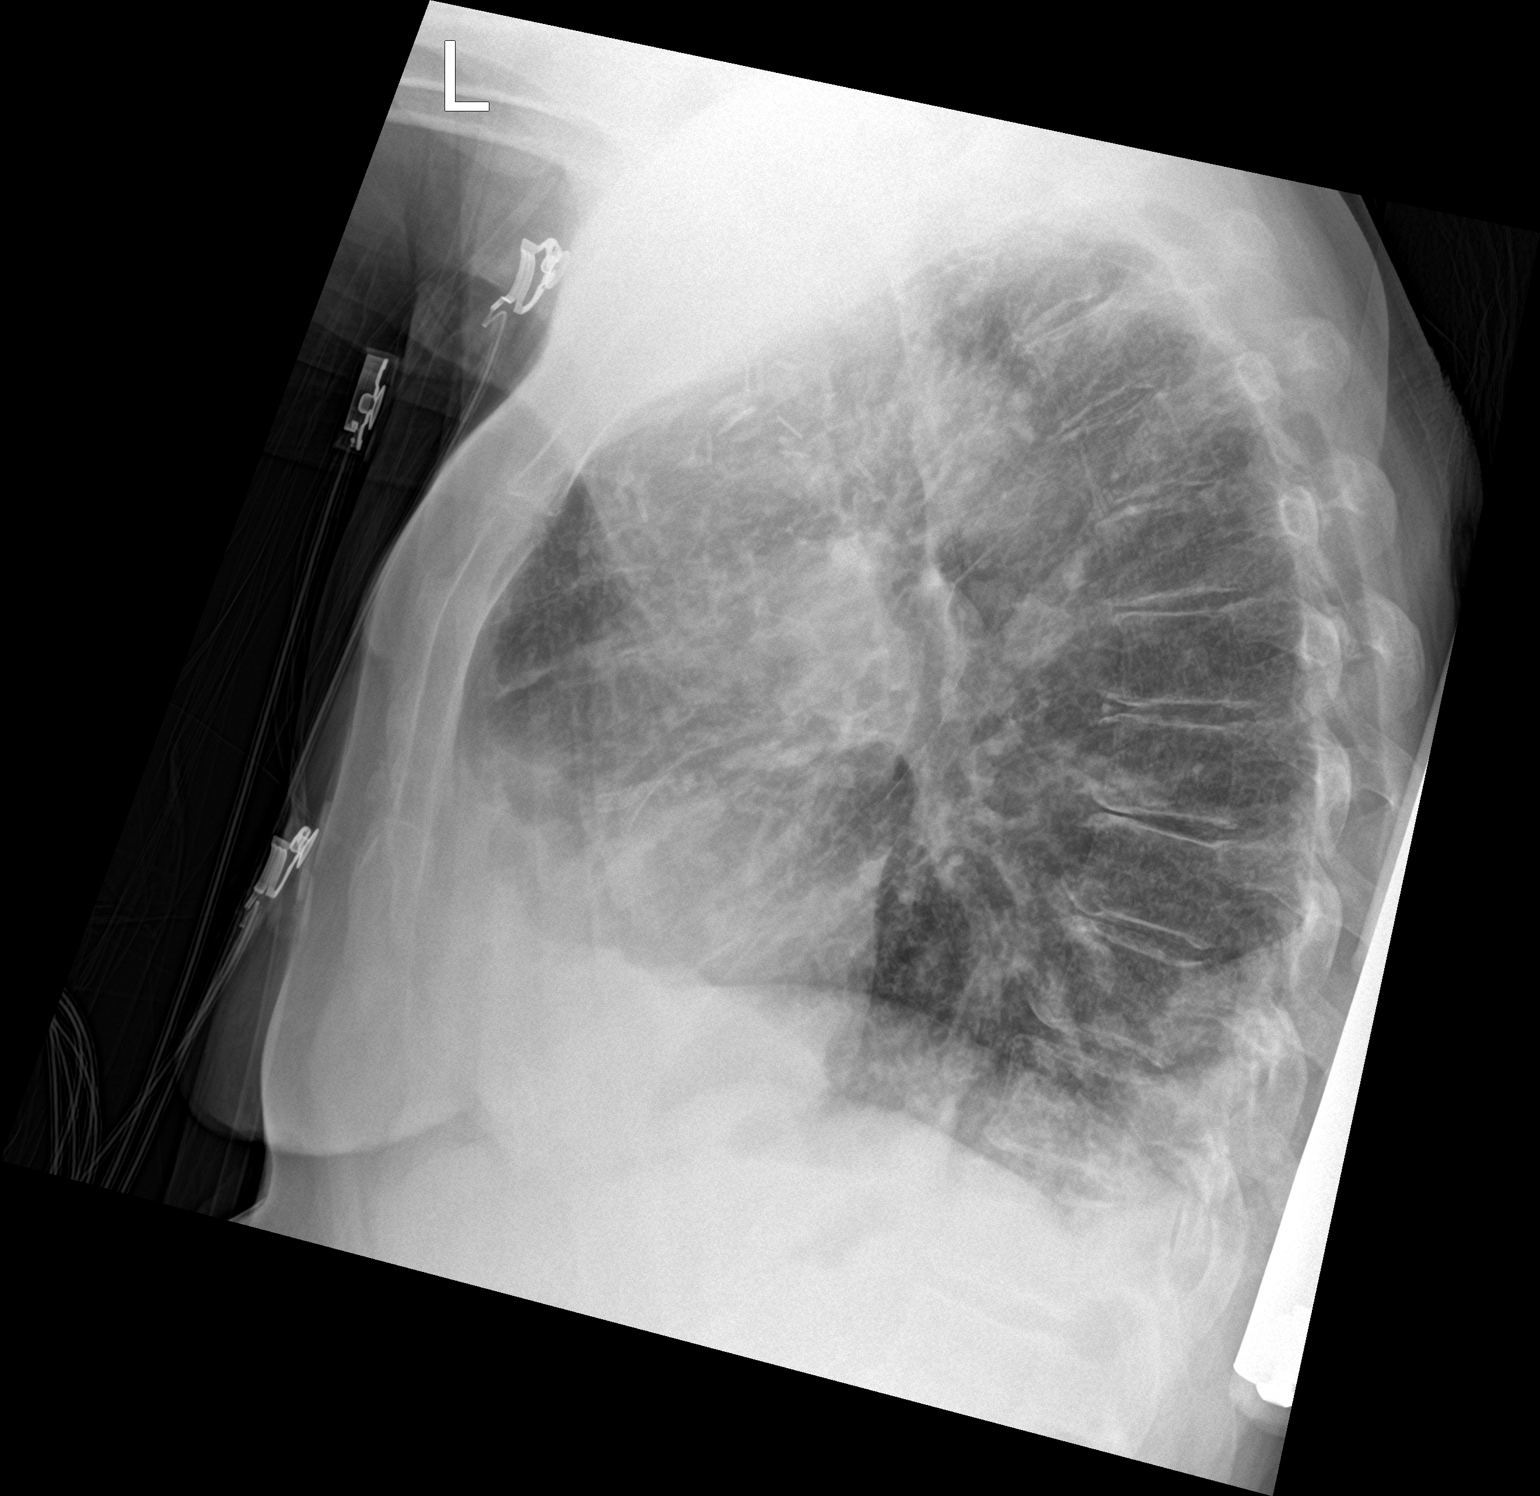

[chest ap]
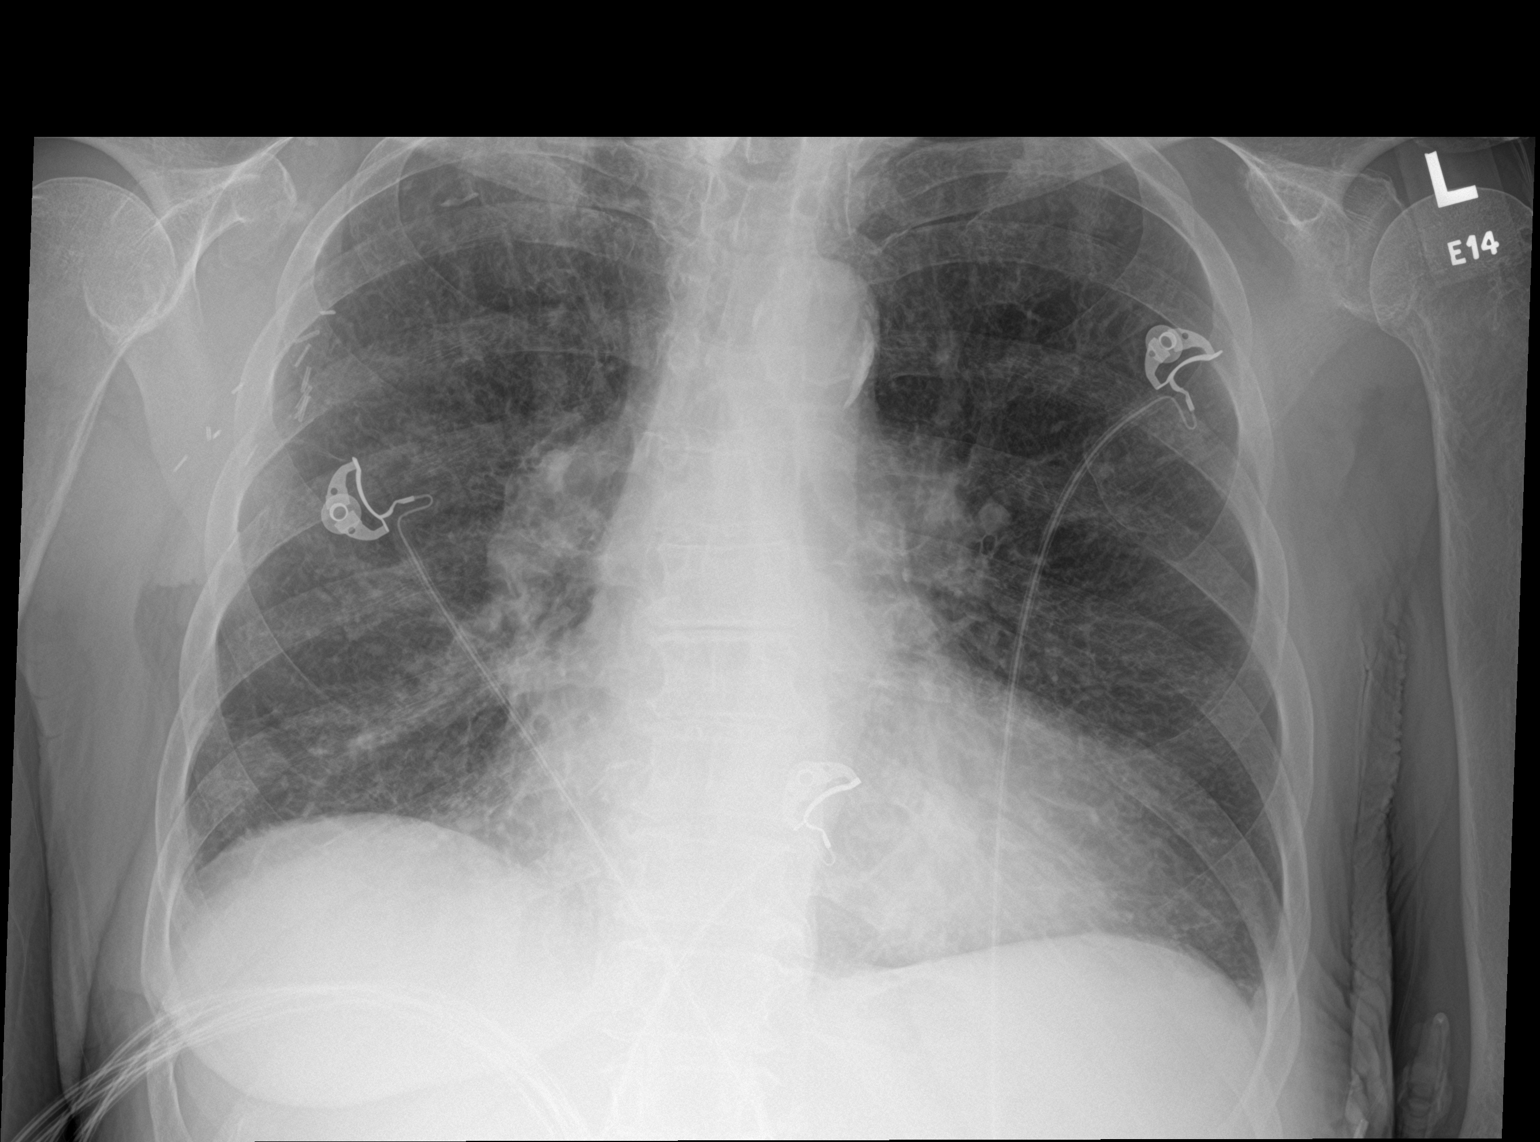

[2 of 2 positions shown; findings below may reference images not displayed]

FINDINGS: The cardiomediastinal silhouette is unchanged in contour.Unchanged
prominence of bilateral hila. Atherosclerotic calcifications.
Asymmetric RIGHT apical pleural thickening, similar in comparison to
prior. No pleural effusion. No pneumothorax. Diffuse interstitial
prominence and peribronchial cuffing, similar in comparison to
prior. Surgical clips project over the RIGHT axilla. Visualized
abdomen is unremarkable.
IMPRESSION: Similar appearance of peribronchial cuffing and diffuse interstitial
prominence which could reflect underlying bronchitis and small
airways disease.

Given underlying COPD, recommend evaluation for candidacy for lung
cancer screening CT.
